# Patient Record
Sex: Female | Born: 1935 | Race: White | Hispanic: No | Marital: Married | Smoking: Never smoker
Health system: Southern US, Community
[De-identification: ages and names within clinical notes are randomized; demographics above are authoritative.]

## PROBLEM LIST (undated history)

## (undated) DIAGNOSIS — J189 Pneumonia, unspecified organism: Secondary | ICD-10-CM

## (undated) DIAGNOSIS — I340 Nonrheumatic mitral (valve) insufficiency: Secondary | ICD-10-CM

## (undated) DIAGNOSIS — E119 Type 2 diabetes mellitus without complications: Secondary | ICD-10-CM

## (undated) DIAGNOSIS — R6510 Systemic inflammatory response syndrome (SIRS) of non-infectious origin without acute organ dysfunction: Secondary | ICD-10-CM

## (undated) DIAGNOSIS — R2681 Unsteadiness on feet: Secondary | ICD-10-CM

## (undated) DIAGNOSIS — J449 Chronic obstructive pulmonary disease, unspecified: Secondary | ICD-10-CM

## (undated) DIAGNOSIS — I739 Peripheral vascular disease, unspecified: Secondary | ICD-10-CM

## (undated) DIAGNOSIS — E7800 Pure hypercholesterolemia, unspecified: Secondary | ICD-10-CM

## (undated) DIAGNOSIS — I6529 Occlusion and stenosis of unspecified carotid artery: Secondary | ICD-10-CM

## (undated) DIAGNOSIS — G9341 Metabolic encephalopathy: Secondary | ICD-10-CM

## (undated) DIAGNOSIS — M545 Low back pain, unspecified: Secondary | ICD-10-CM

## (undated) DIAGNOSIS — A0472 Enterocolitis due to Clostridium difficile, not specified as recurrent: Secondary | ICD-10-CM

## (undated) DIAGNOSIS — K219 Gastro-esophageal reflux disease without esophagitis: Secondary | ICD-10-CM

## (undated) DIAGNOSIS — G4700 Insomnia, unspecified: Secondary | ICD-10-CM

## (undated) DIAGNOSIS — G8929 Other chronic pain: Secondary | ICD-10-CM

## (undated) DIAGNOSIS — I4891 Unspecified atrial fibrillation: Secondary | ICD-10-CM

## (undated) DIAGNOSIS — R7881 Bacteremia: Secondary | ICD-10-CM

## (undated) DIAGNOSIS — A419 Sepsis, unspecified organism: Secondary | ICD-10-CM

## (undated) DIAGNOSIS — F32A Depression, unspecified: Secondary | ICD-10-CM

## (undated) DIAGNOSIS — R55 Syncope and collapse: Secondary | ICD-10-CM

## (undated) DIAGNOSIS — K5730 Diverticulosis of large intestine without perforation or abscess without bleeding: Secondary | ICD-10-CM

## (undated) DIAGNOSIS — I10 Essential (primary) hypertension: Secondary | ICD-10-CM

## (undated) DIAGNOSIS — K859 Acute pancreatitis without necrosis or infection, unspecified: Secondary | ICD-10-CM

## (undated) DIAGNOSIS — F329 Major depressive disorder, single episode, unspecified: Secondary | ICD-10-CM

## (undated) HISTORY — PX: VAGINAL HYSTERECTOMY: SUR661

## (undated) HISTORY — DX: Enterocolitis due to Clostridium difficile, not specified as recurrent: A04.72

## (undated) HISTORY — DX: Peripheral vascular disease, unspecified: I73.9

## (undated) HISTORY — DX: Sepsis, unspecified organism: A41.9

## (undated) HISTORY — DX: Metabolic encephalopathy: G93.41

## (undated) HISTORY — PX: APPENDECTOMY: SHX54

## (undated) HISTORY — PX: TONSILLECTOMY AND ADENOIDECTOMY: SUR1326

## (undated) HISTORY — DX: Systemic inflammatory response syndrome (sirs) of non-infectious origin without acute organ dysfunction: R65.10

## (undated) HISTORY — DX: Syncope and collapse: R55

## (undated) HISTORY — DX: Occlusion and stenosis of unspecified carotid artery: I65.29

## (undated) HISTORY — DX: Insomnia, unspecified: G47.00

## (undated) HISTORY — DX: Diverticulosis of large intestine without perforation or abscess without bleeding: K57.30

## (undated) HISTORY — DX: Unsteadiness on feet: R26.81

## (undated) HISTORY — DX: Nonrheumatic mitral (valve) insufficiency: I34.0

## (undated) HISTORY — PX: DILATION AND CURETTAGE OF UTERUS: SHX78

## (undated) HISTORY — DX: Pneumonia, unspecified organism: J18.9

---

## 1989-06-05 HISTORY — PX: CHOLECYSTECTOMY: SHX55

## 1998-04-23 ENCOUNTER — Encounter: Admission: RE | Admit: 1998-04-23 | Discharge: 1998-07-22 | Payer: Self-pay | Admitting: Internal Medicine

## 1998-07-30 ENCOUNTER — Encounter: Admission: RE | Admit: 1998-07-30 | Discharge: 1998-10-25 | Payer: Self-pay | Admitting: Internal Medicine

## 1998-11-11 ENCOUNTER — Encounter: Admission: RE | Admit: 1998-11-11 | Discharge: 1998-11-14 | Payer: Self-pay | Admitting: Internal Medicine

## 1999-02-03 ENCOUNTER — Encounter: Admission: RE | Admit: 1999-02-03 | Discharge: 1999-02-06 | Payer: Self-pay | Admitting: Orthopedic Surgery

## 1999-05-13 ENCOUNTER — Encounter: Admission: RE | Admit: 1999-05-13 | Discharge: 1999-05-23 | Payer: Self-pay | Admitting: Orthopedic Surgery

## 1999-06-06 HISTORY — PX: CATARACT EXTRACTION W/ INTRAOCULAR LENS  IMPLANT, BILATERAL: SHX1307

## 1999-08-18 ENCOUNTER — Encounter: Admission: RE | Admit: 1999-08-18 | Discharge: 1999-11-16 | Payer: Self-pay | Admitting: Orthopedic Surgery

## 1999-11-24 ENCOUNTER — Encounter: Admission: RE | Admit: 1999-11-24 | Discharge: 2000-02-22 | Payer: Self-pay | Admitting: Orthopedic Surgery

## 2000-04-30 ENCOUNTER — Encounter: Payer: Self-pay | Admitting: Internal Medicine

## 2000-04-30 ENCOUNTER — Encounter: Admission: RE | Admit: 2000-04-30 | Discharge: 2000-04-30 | Payer: Self-pay | Admitting: Internal Medicine

## 2000-05-26 ENCOUNTER — Encounter: Admission: RE | Admit: 2000-05-26 | Discharge: 2000-08-24 | Payer: Self-pay | Admitting: Orthopedic Surgery

## 2001-05-04 ENCOUNTER — Encounter: Payer: Self-pay | Admitting: Internal Medicine

## 2001-05-04 ENCOUNTER — Encounter: Admission: RE | Admit: 2001-05-04 | Discharge: 2001-05-04 | Payer: Self-pay | Admitting: Internal Medicine

## 2001-05-11 ENCOUNTER — Encounter: Payer: Self-pay | Admitting: Internal Medicine

## 2001-05-11 ENCOUNTER — Encounter: Admission: RE | Admit: 2001-05-11 | Discharge: 2001-05-11 | Payer: Self-pay | Admitting: Internal Medicine

## 2002-11-27 ENCOUNTER — Encounter: Admission: RE | Admit: 2002-11-27 | Discharge: 2002-11-27 | Payer: Self-pay | Admitting: Orthopedic Surgery

## 2002-11-27 ENCOUNTER — Encounter: Payer: Self-pay | Admitting: Orthopedic Surgery

## 2002-11-28 ENCOUNTER — Ambulatory Visit (HOSPITAL_BASED_OUTPATIENT_CLINIC_OR_DEPARTMENT_OTHER): Admission: RE | Admit: 2002-11-28 | Discharge: 2002-11-28 | Payer: Self-pay | Admitting: Orthopedic Surgery

## 2003-01-04 HISTORY — PX: COLONOSCOPY: SHX174

## 2003-01-15 ENCOUNTER — Encounter: Admission: RE | Admit: 2003-01-15 | Discharge: 2003-01-15 | Payer: Self-pay | Admitting: Internal Medicine

## 2003-01-15 ENCOUNTER — Encounter: Payer: Self-pay | Admitting: Internal Medicine

## 2003-07-13 ENCOUNTER — Encounter: Payer: Self-pay | Admitting: Internal Medicine

## 2003-07-13 ENCOUNTER — Encounter: Admission: RE | Admit: 2003-07-13 | Discharge: 2003-07-13 | Payer: Self-pay | Admitting: Internal Medicine

## 2003-08-03 ENCOUNTER — Encounter: Admission: RE | Admit: 2003-08-03 | Discharge: 2003-08-03 | Payer: Self-pay | Admitting: Internal Medicine

## 2003-08-06 ENCOUNTER — Encounter: Admission: RE | Admit: 2003-08-06 | Discharge: 2003-08-06 | Payer: Self-pay | Admitting: Internal Medicine

## 2003-08-24 ENCOUNTER — Encounter: Admission: RE | Admit: 2003-08-24 | Discharge: 2003-08-24 | Payer: Self-pay | Admitting: Internal Medicine

## 2003-10-01 ENCOUNTER — Ambulatory Visit (HOSPITAL_COMMUNITY): Admission: RE | Admit: 2003-10-01 | Discharge: 2003-10-01 | Payer: Self-pay | Admitting: Endocrinology

## 2003-10-09 ENCOUNTER — Ambulatory Visit (HOSPITAL_COMMUNITY): Admission: RE | Admit: 2003-10-09 | Discharge: 2003-10-09 | Payer: Self-pay | Admitting: Endocrinology

## 2003-10-09 ENCOUNTER — Encounter (INDEPENDENT_AMBULATORY_CARE_PROVIDER_SITE_OTHER): Payer: Self-pay | Admitting: *Deleted

## 2004-03-03 ENCOUNTER — Encounter: Admission: RE | Admit: 2004-03-03 | Discharge: 2004-06-01 | Payer: Self-pay | Admitting: Internal Medicine

## 2004-03-05 ENCOUNTER — Ambulatory Visit (HOSPITAL_COMMUNITY): Admission: RE | Admit: 2004-03-05 | Discharge: 2004-03-05 | Payer: Self-pay | Admitting: Internal Medicine

## 2004-04-22 ENCOUNTER — Ambulatory Visit (HOSPITAL_COMMUNITY): Admission: RE | Admit: 2004-04-22 | Discharge: 2004-04-22 | Payer: Self-pay | Admitting: Endocrinology

## 2004-06-05 ENCOUNTER — Encounter: Admission: RE | Admit: 2004-06-05 | Discharge: 2004-06-05 | Payer: Self-pay | Admitting: Orthopedic Surgery

## 2004-06-06 ENCOUNTER — Ambulatory Visit (HOSPITAL_BASED_OUTPATIENT_CLINIC_OR_DEPARTMENT_OTHER): Admission: RE | Admit: 2004-06-06 | Discharge: 2004-06-06 | Payer: Self-pay | Admitting: Orthopedic Surgery

## 2004-06-06 ENCOUNTER — Ambulatory Visit (HOSPITAL_COMMUNITY): Admission: RE | Admit: 2004-06-06 | Discharge: 2004-06-06 | Payer: Self-pay | Admitting: Orthopedic Surgery

## 2004-08-13 ENCOUNTER — Ambulatory Visit: Payer: Self-pay | Admitting: Internal Medicine

## 2004-11-05 ENCOUNTER — Ambulatory Visit: Payer: Self-pay | Admitting: Internal Medicine

## 2004-11-14 ENCOUNTER — Ambulatory Visit: Payer: Self-pay | Admitting: Internal Medicine

## 2005-02-02 ENCOUNTER — Ambulatory Visit: Payer: Self-pay | Admitting: Internal Medicine

## 2005-02-06 ENCOUNTER — Ambulatory Visit: Payer: Self-pay | Admitting: Internal Medicine

## 2005-02-09 ENCOUNTER — Ambulatory Visit: Payer: Self-pay | Admitting: Internal Medicine

## 2005-03-23 ENCOUNTER — Ambulatory Visit: Payer: Self-pay | Admitting: Internal Medicine

## 2005-04-01 ENCOUNTER — Ambulatory Visit: Payer: Self-pay | Admitting: Endocrinology

## 2005-04-14 ENCOUNTER — Ambulatory Visit (HOSPITAL_COMMUNITY): Admission: RE | Admit: 2005-04-14 | Discharge: 2005-04-14 | Payer: Self-pay | Admitting: Endocrinology

## 2005-04-29 ENCOUNTER — Encounter: Admission: RE | Admit: 2005-04-29 | Discharge: 2005-04-29 | Payer: Self-pay | Admitting: Endocrinology

## 2005-04-29 ENCOUNTER — Other Ambulatory Visit: Admission: RE | Admit: 2005-04-29 | Discharge: 2005-04-29 | Payer: Self-pay | Admitting: Interventional Radiology

## 2005-04-29 ENCOUNTER — Encounter (INDEPENDENT_AMBULATORY_CARE_PROVIDER_SITE_OTHER): Payer: Self-pay | Admitting: *Deleted

## 2005-05-05 ENCOUNTER — Ambulatory Visit: Payer: Self-pay | Admitting: Internal Medicine

## 2005-05-08 ENCOUNTER — Ambulatory Visit: Payer: Self-pay | Admitting: Internal Medicine

## 2005-05-22 ENCOUNTER — Ambulatory Visit (HOSPITAL_BASED_OUTPATIENT_CLINIC_OR_DEPARTMENT_OTHER): Admission: RE | Admit: 2005-05-22 | Discharge: 2005-05-22 | Payer: Self-pay | Admitting: Urology

## 2005-05-22 ENCOUNTER — Ambulatory Visit (HOSPITAL_COMMUNITY): Admission: RE | Admit: 2005-05-22 | Discharge: 2005-05-22 | Payer: Self-pay | Admitting: Urology

## 2005-08-11 ENCOUNTER — Ambulatory Visit: Payer: Self-pay | Admitting: Internal Medicine

## 2008-07-24 ENCOUNTER — Ambulatory Visit: Payer: Self-pay | Admitting: Cardiology

## 2008-07-24 ENCOUNTER — Inpatient Hospital Stay (HOSPITAL_COMMUNITY): Admission: EM | Admit: 2008-07-24 | Discharge: 2008-07-31 | Payer: Self-pay | Admitting: Emergency Medicine

## 2008-07-30 ENCOUNTER — Encounter (INDEPENDENT_AMBULATORY_CARE_PROVIDER_SITE_OTHER): Payer: Self-pay | Admitting: Internal Medicine

## 2008-08-08 ENCOUNTER — Ambulatory Visit: Payer: Self-pay

## 2008-08-14 ENCOUNTER — Ambulatory Visit: Payer: Self-pay | Admitting: Cardiology

## 2011-02-17 NOTE — Discharge Summary (Signed)
[name], [name] NO.:  [id number]   MEDICAL RECORD NO.:  [id number]          PATIENT TYPE:  INP   LOCATION:  1507                         FACILITY:  [facility]   PHYSICIAN:  Isidor Holts, M.D.  DATE OF BIRTH:  [birth year]   DATE OF ADMISSION:  07/24/2008  DATE OF DISCHARGE:                               DISCHARGE SUMMARY   DATE OF DISCHARGE:  To be determined.   DISCHARGE DIAGNOSES:  1. Acute pancreatitis.  2. Dehydration, volume depletion and  hypotension.  3. Dyslipidemia.  4. Type 2 diabetes mellitus.  5. Anemia of chronic disease.  6. Hypertension.  7. History of depression.  8. Irritable bowel syndrome.  9. Fluid overload.   DISCHARGE MEDICATIONS:  1. Glimepiride 4 mg p.o. b.i.d.  2. Omeprazole 20 mg p.o. b.i.d.  3. Zocor 20 mg p.o. q.h.s.  4. Citalopram 20 mg p.o. daily.  5. Fenofibrate 160 mg p.o. daily.  6. Alprazolam 0.5 mg p.o. p.r.n. t.i.d.  7. Lisinopril 5 mg p.o. daily.  8. Aspirin 81 mg p.o. daily.  9. Vitamin B12 1000 mcg p.o. daily.  10.Centrum Silver 1 p.o. daily.  11.Methylin 5 mg p.o. b.i.d.  12.HyoMax sublingual 0.125 mg p.r.n.   Note:  This medication list may of course be updated/modified at the  time of actual discharge, by discharging MD.   PROCEDURES:  1. Chest x-ray dated July 24, 2008.  This showed chronic changes,      no acute findings.  2. Abdominal/pelvic CT scan dated July 24, 2008.  This showed      fullness of the head of the pancreas compared to the body of and      tail. There also may be some stranding of fat surrounding the head      of the pancreas. Cannot rule early pancreatitis.  A 3 cm simple      cyst of the right kidney. Pelvic CT scan showed no acute or      significant findings.  3. Head CT scan dated July 24, 2008. This showed atrophy, no acute      or focal abnormality.  4. Chest x-ray dated July 26, 2008.  This showed perihilar      pulmonary edema and small bilateral effusions.  5. Chest x-ray dated July 28, 2008.  This showed progressive      slightly asymmetric airspace disease, right greater than left,      which may represent pulmonary edema, although infectious infiltrate      cannot be excluded in the proper clinical setting.   CONSULTATIONS:  None.   ADMISSION HISTORY:  As in H and P notes of July 24, 2008, dictated by  Dr. Richarda Overlie.  However, in brief this is a 77 year old female, with  known history of type 2 diabetes mellitus, dyslipidemia, hypertension,  depression, irritable bowel syndrome status post appendectomy, status  post cholecystectomy, presenting with a 1-day history of acute onset  abdominal pain in the epigastrium and left upper quadrant, radiating to  the back. In the emergency department, she was found to be hypotensive  with systolic blood pressure in the 70s. Initial laboratory  studies  showed an elevated lipase level of 513.  She was admitted further  evaluation, investigation and management.   CLINICAL COURSE:  1. Acute pancreatitis.  This was confirmed by elevated lipase levels,      as well as presenting symptoms. Abdominal CT scan demonstrated      fullness of the head of the pancreas, associated with      peripancreatic stranding, consistent with early pancreatitis.  She      was managed with bowel rest, intravenous fluid hydration,      analgesics with satisfactory clinical response. By July 25, 2008, lipase level had dropped to 203.  Thereafter, there was a      steady diminution of lipase, so that on July 29, 2008, lipase      level was entirely normal at 33.  The patient's abdominal pain      quickly resolved, and we were able to place her on liquid diet from      July 26, 2008.  She has tolerated this. Diet has been gradually      advanced and as of July 29, 2008 she had been advanced to soft      mechanical low fat diet.   1. Dehydration/volume depletion.  As mentioned above in the  presenting      history, the patient was found to be hypotensive with blood      pressure in the 70s in the emergency department.  This was      successfully addressed with aggressive intravenous fluid hydration.      BUN was 21, creatinine 1.33.  With intravenous fluid hydration, the      patient's volume status improved. By July 24, 2008, it was clear      that she was adequately hydrated.  Her intravenous fluid rate was      reduced on that date, in an attempt to avoid fluid overload.      Unfortunately we were unable to discontinue intravenous fluids      entirely on that date, because of persisting nausea, although the      patient had no vomiting, and at that time it was not yet clear      whether she would be able to tolerate advanced diet.   1. Fluid overload.  This was secondary to aggressive intravenous fluid      hydration.  The patient on July 28, 2008, became acutely short      of breath.  Chest x-ray was done, which confirmed pulmonary edema.      BNP was also significantly elevated at 555.  Intravenous fluids      were discontinued.  The patient was started on intravenous Lasix      and by July 29, 2008 she felt considerably better.  It is      anticipated that over the next few days, fluid status will      normalize.   1. Type 2 diabetes mellitus.  The patient was on Amaryl, pre-      admission.  In the first couple of days of admission she showed a      tendency to run low blood glucose levels.  She was, therefore,      managed with intravenous D5 normal saline.  However, we were      subsequently able to discontinue this, and she was managed with      sliding scale insulin coverage.  She has remained euglycemic during  the course of this hospitalization and on July 29, 2008 we      gingerly restarted her Amaryl.   1. Anemia of chronic disease.  The patient at the time of presentation      had a hemoglobin of 11.6, MCV was normocytic at 91.3.  Hemoglobin      levels have remained stable during the course of this      hospitalization, and as a matter of fact, hemoglobin was 11.4 with      hematocrit of 33.3 on July 29, 2008.  This is likely anemia of      chronic disease, against a background of known type 2 diabetes      mellitus.   1. Hypertension.  The patient's BP was marginally elevated at 148/82      on July 29, 2008.  She has therefore been recommenced on pre-      admission low-dose Lisinopril.   1. Dyslipidemia.  The patient has a known history of      dyslipidemia/hypertriglyceridemia and is on a combination of      Simvastatin and Tricor, which we have continued. Her lipid profile      during this hospitalization was as follows: Total cholesterol 124,      triglyceride 232, HDL 31, LDL 47.   1. History of depression.  The patient's mood remained stable during      the course of this hospitalization. She continues on pre-admission      antidepressant medication.   1. Irritable bowel syndrome.  There were no issues referable to this,      during the course of this hospitalization.   DISPOSITION:  The patient was on July 29, 2008, clearly on the mend,  and it is anticipated that over the next couple of days, she will  recover sufficiently to be discharged.  Details will be elucidated in  addendum at the appropriate time, by discharging MD, and the decision as  to whether or not to continue oral Lasix on discharge at least for the  short-term, will be addressed by discharging MD.   DIET:  Heart-healthy/carbohydrate modified. Recommended low-fat diet for  at least one week.   ACTIVITY:  As tolerated.   FOLLOW-UP INSTRUCTIONS:  The patient is to follow up routinely with her  primary MD, Dr. Rocky Morel at [facility].      Isidor Holts, M.D.  Electronically Signed     CO/MEDQ  D:  07/29/2008  T:  07/29/2008  Job:  [id number]   cc:   Rocky Morel, MD  [facility]

## 2011-02-17 NOTE — H&P (Signed)
NAME:  [name] [name], [name] [name] NO.:  [id number]   MEDICAL RECORD NO.:  [id number]          PATIENT TYPE:  EMS   LOCATION:  ED                           FACILITY:  [facility]   PHYSICIAN:  Richarda Overlie, MD       DATE OF BIRTH:  [birth year]   DATE OF ADMISSION:  07/24/2008  DATE OF DISCHARGE:                              HISTORY & PHYSICAL   PRIMARY CARE PHYSICIAN:  Dr. Rocky Morel at [facility] with  [facility].   SUBJECTIVE:  This is a 77 year old female with history of hypertension  and depression who presents to the ER with a chief complaint of  abdominal pain, fairly acute in onset that woke her up this morning.  The patient noticed that she had some musculoskeletal-like pain in her  thoracic and lumbar spine about a week ago which she attributed to  hyperextension of her spine while working  at home.  However, the pain  this morning was different and was associated with nausea without any  vomiting.  She denies any fevers, chills or rigors at home, but has had  a sore throat and a nonproductive cough for the last three days for  which she has been taking Zicam.  She also notices some loose stools for  the last three days up to 3-4 times a day for which she has been taking  hyoscyamine.  However, she has not had a bowel movement this morning.  She denies any associated chest pain, shortness of breath, orthopnea,  paroxysmal nocturnal dyspnea, peripheral edema, urinary urgency,  frequency or dysuria, any blood in her stools, black-tarry stool.  She  denies any symptoms of syncope, presyncope, lightheadedness.  Denies any  associated aggravating or relieving factors for her abdominal pain.  Her  abdominal pain is described as epigastric in region and left upper  quadrant with radiation to the back.  She denies being started on any  new medications.  Has had a gallbladder removed several years ago.  She  does note that her triglycerides were elevated during her last  check.  She had a triglyceride checked yesterday, but is unclear of the results.  In the ER, the patient did have an episode of hypertension and  unresponsiveness with systolic blood pressure in the 70s and responded  appropriately resuscitation with IV fluids and two large bore IVs were  placed.  Since then, the patient has been hemodynamically stable, alert,  oriented and conversive.   PAST MEDICAL HISTORY:  1. Hypertension.  2. Depression.   PAST SURGICAL HISTORY:  1. Appendectomy.  2. Cholecystectomy.   SOCIAL HISTORY:  Denies any use of alcohol or tobacco.   ALLERGIES:  PENICILLIN AND SULFA, BOTH OF WHICH CAUSE A RASH AND HIVES.   HOME MEDICATIONS:  1. Alprazolam 1 mg at bedtime.  2. Alprazolam 0.5 mg b.i.d. p.r.n.  3. Ritalin 5 mg b.i.d.  4. Simvastatin 20 mg once daily.  5. Omeprazole 20 mg b.i.d.  6. Lisinopril 5 mg once daily.  7. Lactase oral as needed.  8. HyoMax sublingual 0.125 mg as needed.  9. Celexa 20 mg once a day.  10.Fenofibrate 160 mg once daily.  11.Glimepiride 4 mg b.i.d.   REVIEW OF SYSTEMS:  I have included an HPI.   PHYSICAL EXAMINATION:  VITAL SIGNS:  Currently, the blood pressure is  120/46, pulse 70, respirations 18, temperature 98.2.  GENERAL:  The patient appears to be comfortable currently, no acute  distress.  HEENT:  Pupils equal, round and reactive to light.  Extraocular  movements intact.  NECK:  Supple without any JVD.  Cervical spine is nontender to  palpation.  CARDIOVASCULAR:  Regular rate and rhythm without any appreciable  murmurs, rubs or gallops.  RESPIRATIONS:  Clear to auscultation bilaterally.  No wheezes, crackles  or rhonchi.  CARDIOVASCULAR:  Regular rate and rhythm.  No appreciable murmurs, rubs  or gallops.  ABDOMEN:  Tender to palpation in the epigastric and the left upper  quadrant.  Hypoactive bowel sounds without any rebound or guarding.  EXTREMITIES:  Without any cyanosis, clubbing or edema.  NEUROLOGICAL:   Cranial nerves II-XII grossly intact.  SKIN:  Color is normal without any rash or petechiae.   LABORATORY DATA:  The patient has a CT abdomen without contrast and CT  pelvis without contrast.  CT of the head shows atrophy.  No acute focal  abnormality.  CT of the abdomen shows  acute pancreatitis, and a 3 cm  simple cyst  in the right kidney.  CT of the pelvis shows no acute or  significant findings.   Chest x-ray shows heart and mediastinal contour was to be normal, mild  peribronchial thickening and minimal scarring of the left mid lung zone.  No pleural fluid or osseous lesions.   LABORATORY DATA:  WBC 7.9, hemoglobin 11.6, hematocrit 34.6, MCV 91.3,  platelets 299.  Sodium 134, potassium 4.2, chloride 104, glucose 173,  BUN 21, creatinine 1.33, alk-phos 57, T-bili 0.7, AST 39, ALT 19,  calcium 9.2, lipase 513.  Cardiac enzymes:  CK 54, CK MB 1.6, troponin  0.01, BMP 50.4, UA negative for nitrite, leukocyte esterase.   ASSESSMENT/PLAN:  1. Pancreatitis, associated with hemodynamic compromise, given the      patient's hypotensive episode in the ER.  The patient currently      appears to be pain free.  The differential diagnosis for the causes      of pancreatitis would include medications such as Celexa which the      patient is taking, gall stone pancreatitis.  She denies any alcohol      use.  She is a nonsmoker.  Liver function is normal.  Doubt CBD      stone.  No previous history.  Therefore, unlikely to be autoimmune      pancreatitis.  Viral etiologies in the differential because of      recent illness.  The patient will be admitted to a stepdown unit      because of close monitoring of her blood pressure.  She will be      started on aggressive IV hydration with D-5 normal saline.  She is      a diabetic.  Kept n.p.o.  Will not start her on any antibiotics as      the patient's white count appears to be normal.  Follow her amylase      and lipase in the morning.  Start her  on a PPI, p.r.n. Dilaudid for      pain.  Hold the Celexa for now.  Check her triglyceride level to      rule out  hypertriglyceridemia.  2. Anemia.  Unclear of the patient's baseline hemoglobin.  However,      this needs to be followed closely in the setting of hypotension.      No obvious occult source of bleeding.  3. Diabetes.  Hold glimepiride.  Continue the patient on sliding scale      insulin and D-5 while the patient is n.p.o.  4. Depression.  Will continue with p.r.n. Xanax and Ritalin.  Will not      give any Celexa for now.  5. Hypertension.  Likely secondary to dehydration and vasovagal      response to morphine.  Will cycle cardiac enzymes to rule out any      evidence of acute coronary syndrome.  She is a full code being      admitted to the stepdown unit.      Richarda Overlie, MD  Electronically Signed     NA/MEDQ  D:  07/24/2008  T:  07/24/2008  Job:  [id number]

## 2011-02-17 NOTE — Assessment & Plan Note (Signed)
[facility]                            CARDIOLOGY OFFICE NOTE   [name] [name], [name] [name]                   MRN:          [id number]  DATE:08/14/2008                            DOB:          December 27, 1929    [name] [name] is 77 years of age.  I had seen her at [facility].  She  had pancreatitis and was treated very nicely.  She was appropriately  volume loaded.  However, she developed some shortness of breath.  She  also had some mild increase in heart rate.  There was a slight troponin  bump.  It was felt that all of this may have been related to the volume  overload.  She diuresed very nicely and stabilized.  Her echo showed  excellent LV function.  She was ultimately discharged and she came back  for an outpatient Myoview scan in our office.  Study was done on  August 08, 2008.  At that time, she received adenosine and she was  mildly hypotensive with the adenosine.  However, she did well.  Her  adenosine scan showed no significant signs of ischemia.   Today, she is feeling well.  She is not having any recurrent symptoms.   ALLERGIES:  PENICILLIN and SULFA.   MEDICATIONS:  1. Glimepiride 4 b.i.d.  2. Methylin 5 b.i.d.  3. Omeprazole.  4. Simvastatin 20.  5. Citalopram.  6. Fenofibrate.  7. Alprazolam.  8. Lisinopril.  9. Metoprolol.  10.Furosemide 20.  11.Potassium.  12.Ritalin 5 b.i.d.  13.Eye drops.   OTHER MEDICAL PROBLEMS:  See the list below.   REVIEW OF SYSTEMS:  She is not having any GI or GU symptoms at this  time.  She is not having any fevers or chills.  She has no skin rashes.  Otherwise, her review of systems is negative.   PHYSICAL EXAMINATION:  VITAL SIGNS:  Blood pressure is 123/74 with a  pulse of 83.  GENERAL:  The patient is here with a family member.  She is oriented to  person, time, and place.  Affect is normal.  HEENT:  No xanthelasma.  She has normal extraocular motion.  NECK:  There are no carotid bruits.   There is no jugular venous  distention.  LUNGS:  Clear.  Respiratory effort is not labored.  CARDIAC:  S1 and S2.  There are no clicks or significant murmurs.  ABDOMEN:  Soft.  EXTREMITIES:  She has no peripheral edema.   As outlined, the patient had an adenosine Myoview scan on August 08, 2008.  It revealed no significant ischemia and a normal ejection  fraction.   PROBLEMS:  1. Recent acute pancreatitis of unknown etiology that was treated and      resolved at [facility].  2. Hypokalemia that was treated.  3. Episode of volume overload that was probably congestive heart      failure that was diastolic related to volume overload.  4. Mild elevation of troponins probably due to the episode of      congestive heart failure.  5. Brief supraventricular tachycardia related to the episode of  congestive heart failure.  6. Diabetes.  7. Dyslipidemia.  8. Hypertension.  9. Ejection fraction 65%-70%.  10.Mild aortic valvular regurgitation.  11.Mild mitral annular calcification.  12.Mild mitral regurgitation.   The patient is stable.  She has no signs of ischemic heart disease.  She  is on appropriate medications.  She does not need any further cardiac  workup.  This note will go to Dr. Daryll Brod.  We have also given the  patient copies of her major documents from her hospitalization at [facility]  to take to Dr. Daryll Brod.  Most of those reports were copied to him  through the computer system in the hospital.     Luis Abed, MD, [facility]  Electronically Signed    JDK/MedQ  DD: 08/14/2008  DT: 08/15/2008  Job #: [id number]   cc:   Rocky Morel, MD

## 2011-02-17 NOTE — Consult Note (Signed)
NAME:  [name], [name] NO.:  [id number]   MEDICAL RECORD NO.:  [id number]          PATIENT TYPE:  INP   LOCATION:  1422                         FACILITY:  [facility]   PHYSICIAN:  Luis Abed, MD, FACCDATE OF BIRTH:  07/21/1930   DATE OF CONSULTATION:  07/30/2008  DATE OF DISCHARGE:                                 CONSULTATION   [name] was admitted with pancreatitis.  She was treated  appropriately with fluids and has been improving.  Unfortunately, she  then became short of breath.  Her BNP became elevated and she has a  troponin up to 0.10.  She also had a burst of supraventricular  tachycardia.  With all of these findings, cardiology was asked to  consult with further recommendations.  She denies any major chest pain  or shortness of breath in the past.  However, she does mention that she  has excess sweating when she shies to do any type of exercise.  She  has not had any syncope or presyncope.  There is no prior documented  significant coronary disease.  There is a history of hypertension.   PAST MEDICAL HISTORY:   ALLERGIES:  PENICILLIN, SULFA.   MEDICATIONS PRIOR TO ADMISSION:  She was on alprazolam, Ritalin,  simvastatin, omeprazole, lisinopril, Hyomax, Celexa, fenofibrate, and  glimepiride.   OTHER MEDICAL PROBLEMS:  See the complete list below.   SOCIAL HISTORY:  The patient does not smoke.  She is married and her  husband was in the room at the time of the evaluation.   FAMILY HISTORY:  See the records in the prior charts.   REVIEW OF SYSTEMS:  This morning she is comfortable in bed.  She is not  having any headaches or eye problems.  She is not having fever or rash.  She has no significant GI or GU symptoms at this time.  She does not  have any significant swelling.  She noted in the past 3 days, however,  that she has had some swelling in her hands.  Otherwise her review of  systems is negative.   PHYSICAL EXAM:  VITAL SIGNS:   Blood pressure is 121/76 with a pulse of  77.  CONSTITUTIONAL:  The patient is oriented to person, time and place.  Affect is normal.  She is here with her husband in the room.  HEENT:  Reveals no xanthelasma.  She has normal extraocular motion.  NECK:  There are no carotid bruits.  There is no jugular venous  distention.  CARDIAC:  Exam reveals S1 with an S2.  There are no clicks or  significant murmurs.  LUNGS:  Lungs reveal a few crackles in her left base.  ABDOMEN:  Soft.  EXTREMITIES:  She she has no significant peripheral edema at this time.   LABORATORY DATA:  Hemoglobin is 11.4.  BUN is 21 with a creatinine of  1.43.  Potassium is 3.8.  During the day yesterday, her potassium had  dropped to 2.6.  This increase in creatinine is above her baseline with  a BUN of 8 and creatinine is 0.97.  Initial BNP was normal.  Later BNP  revealed BNP in the range of 500.  Troponin was 0.02 on the 21st.  On  the 25th troponin was 0.09 followed by 0.10 followed by 0.12 followed by  0.09.  There was no significant CPK change during this time and in no  significant MB change.  EKG on admission revealed a tiny Q-wave in lead  III.  This has not changed significantly since being here.  On the 25th  there is a tracing showing rapid rate of approximately 130.  I believe  that the rhythm at this time is sinus tachycardia with scattered PACs.  There is another tracing from October 25 with a slower rate.  It is  possible that this could be atrial flutter.  Chest x-ray on July 24, 2008 had shown no acute findings.  On October 22, there was question of  some fluid overload and on October 24 there were small pleural  effusions.  These x-rays seem compatible with worsening fluid status.   PROBLEMS:  1. Pancreatitis.  This was the admitting diagnosis and this appears to      be improved.  2. Volume overload the that occurred with the patient in the hospital      while she was being appropriately  treated for pancreatitis.  The      patient then developed shortness of breath and symptoms secondary      to volume overload.  3. Troponin elevation up to 0.12.  I suspect it is most likely that      this is related to her episode of congestive heart failure related      to volume.  4. Supraventricular tachycardia.  This appears to be both sinus tach      with premature atrial contractions and the possibility of a brief      run of atrial flutter.  I would follow this clinically as this is      most likely related to her acute episode of congestive heart      failure.   At this point, I suspect that all of her cardiac symptoms are related to  volume overload with congestive heart failure leading to elevated  troponin, elevated BNP, shortness of breath and atrial arrhythmias.  Of  course we must keep the possibility of ischemic heart disease in mind.  As mentioned the EKG on admission showed a tiny Q-wave in lead III.   RECOMMENDATIONS:  1. Continue to monitor.  2. Increase activity.  3. Proceed with 2-D echo which is being done this morning.  If she has      normal LV function, then she can probably go home soon with      outpatient followup that should include an exercise test.  If her      LV function by echo is not normal, then we need to consider      ischemia more seriously at this time and strongly consider      proceeding with cardiac catheterization.  I would not anti-      coagulate the patient at this time for her atrial arrhythmias.      First of all, they may be very self limited and second, with      history of pancreatitis, we certainly do not want to increase her      risk of abdominal bleeding.   We will follow the patient.  Her primary care is in [facility].  I made  it clear that we will  be happy to send the information back to her  primary physician for to get cardiology followup in [facility].  If she  would like for her followup to be with our group, I will be  happy to see  her in followup.      Luis Abed, MD, [facility]  Electronically Signed     JDK/MEDQ  D:  07/30/2008  T:  07/30/2008  Job:  [id number]   cc:   Dr. Rocky Morel  [facility]

## 2011-02-17 NOTE — Discharge Summary (Signed)
[name] [name], [name] NO.:  [id number]   MEDICAL RECORD NO.:  [id number]          PATIENT TYPE:  INP   LOCATION:  1422                         FACILITY:  [facility]   PHYSICIAN:  Marcellus Scott, MD     DATE OF BIRTH:  May 21, 1930   DATE OF ADMISSION:  07/24/2008  DATE OF DISCHARGE:  07/31/2008                               DISCHARGE SUMMARY   ADDENDUM TO INTERIM DISCHARGE SUMMARY:  This is an addendum to the  interim discharge summary that was done by Dr. Isidor Holts on  July 29, 2008.  It will update events since including discharge  diagnosis and discharge medications.   PRIMARY CARE PHYSICIAN:  Dr. Rocky Morel, [place], [place].   PRIMARY CARDIOLOGIST:  Luis Abed, MD, [facility], [facility].   DISCHARGE DIAGNOSES:  1. Acute pancreatitis, unclear etiology, resolved.  2. Hypokalemia, secondary to diuretics.  3. Question chronic kidney disease.  4. Acute on chronic diastolic congestive heart failure secondary to      volume overload.  5. Type 2 non-ST elevation MI, for outpatient Myoview by [facility]      Cardiology.  6. Supraventricular tachycardia, resolved.  7. Type 2 diabetes mellitus.  8. Dyslipidemia.  9. Hypertension.   DISCHARGE MEDICATIONS:  1. Glimepiride reduced to 4 mg p.o. daily.  2. Methylin 5 mg p.o. b.i.d.  3. Omeprazole 20 mg p.o. b.i.d.  4. Simvastatin 20 mg p.o. daily.  5. Citalopram 20 mg p.o. daily.  6. Fenofibrate 160 mg p.o. daily.  7. HyoMax-FL p.r.n.  8. Alprazolam 1 mg tablet, half table two times a day and one tablet      at night.  9. Lisinopril 5 mg p.o. daily.  10.Aspirin 81 mg p.o. daily.  11.Vitamin B12, 1000 mcg p.o. daily.  12.Centrum Silver one p.o. daily.  13.Metoprolol 25 mg p.o. b.i.d.  14.Lasix 20 mg p.o. daily from August 01, 2008.  15.Potassium chloride 20 mEq p.o. daily from August 01, 2008.   PROCEDURES:  A 2D echocardiogram on July 30, 2008.  Summary:  Overall, left ventricular  systolic function was vigorous.  Left  ventricular ejection fraction was estimated, range being 65-70%.  There  were no left ventricular regional wall motion abnormalities.  Mild  aortic valve regurgitation, moderate mitral annular calcification.  Mild  mitral valve regurgitation.  Left atrium moderately dilated.   Chest x-ray on July 30, 2008.  Impression:  Enlarging bilateral  pleural effusions.  Re-resolution of pulmonary edema.  Mild vascular  congestion and bibasilar atelectasis.   PERTINENT LABORATORY DATA:  Cardiac panel today is negative.  Basic  metabolic panel with potassium of 3.1, BUN 29, creatinine 1.39, glucose  129, CBCs with hemoglobin 12.6, hematocrit 37.1, white blood cells 12,  platelets 403.  TSH 1.772.   CONSULTATIONS:  Cardiology, Dr. Myrtis Ser. And Dr. Shirlee Latch.   HOSPITAL COURSE/PATIENT DISPOSITION:  On the afternoon of July 29, 2008, the patient was noted to have a heart rate between 130-170 at  approximately 3:00 p.m. which was asymptomatic and detected on routine  vital signs.  EKG at that time shows sinus tachy with premature atrial  complexes at 132 beats per minute.  The patient was then placed on  telemetry, and cardiac enzymes were cycled and TSH was requested.  Overnight, she also experienced again irregular heart rate in the 130s  to 140s and increased to 170s with activity.  EKG at that time suggested  atrial flutter.  Subsequently, her troponin was noted to increase to  0.12.  The patient apparently complained of some dyspnea as well.  Her  CK and CK MB continued to be normal.  Cardiology was then consulted.  They determined that she had acute on chronic diastolic heart failure  secondary to volume overload, and diuresed her with good effect.  Her  echocardiogram shows good LV function.  They have cleared her for  discharge from a Cardiac's perspective.  Myoview will be arranged by  their offices as an outpatient.  The patient is to followup with  Dr.  Myrtis Ser in the next two weeks.  The patient is hypokalemic which is being  repleted today.  The patient will also be discharged on low-dose Lasix  and potassium supplements.  Her basic metabolic panel will have to be  followed as an outpatient closely, given her hypokalemia and mildly  elevated creatinine which may be an element of chronic kidney disease.  The patient, today, indicates that she is asymptomatic of any chest pain  or dyspnea or abdominal pain.  She is now tolerating diet well.  She is  at baseline activity and ambulating in the room and to the bathroom.  She indicates she will return home to stay with her husband and her  close friend of 50 years.  She is to follow up with her primary medical  doctor in the next 5-7 days with repeat basic metabolic panel and with  Dr. Myrtis Ser in the next two weeks.  I have discussed with Dr. Shirlee Latch who  has cleared her for discharge.      Marcellus Scott, MD  Electronically Signed     AH/MEDQ  D:  07/31/2008  T:  07/31/2008  Job:  [id number]   cc:   Luis Abed, MD, [facility]  [address]  [address]  [place]  [place] [address]   [place] Dr. Lennox Solders   Marca Ancona, MD  [address]  [place] [address]

## 2011-02-20 NOTE — Op Note (Signed)
NAME:  [name] [name], NEER NO.:  [id number]   MEDICAL RECORD NO.:  [id number]          PATIENT TYPE:  AMB   LOCATION:  NESC                         FACILITY:  [facility]   PHYSICIAN:  Mark C. Vernie Ammons, M.D.  DATE OF BIRTH:  Dec 28, 1929   DATE OF PROCEDURE:  05/22/2005  DATE OF DISCHARGE:                                 OPERATIVE REPORT   PREOPERATIVE DIAGNOSES:  Cystocele.   POSTOPERATIVE DIAGNOSES:  Cystocele.   PROCEDURE:  Anterior repair.   SURGEON:  Mark C. Vernie Ammons, M.D.   ANESTHESIA:  General.   BLOOD LOSS:  Minimal.   DRAINS:  None.   SPECIMENS:  None.   COMPLICATIONS:  None.   INDICATIONS:  The patient is a 77 year old white female who was seen for  bulging of her anterior vaginal wall to the level of the introitus.  Occasionally it causes discomfort in the lower abdomen low back. She has had  this for some time but was ill and now has improved to the point where she  wants to have this repaired. In October 2005, I found a grade 1 cystocele  only. She has no urgency or stress incontinence. She has stopped her Plavix  for 1 week. We discussed the risks, complications and alternatives to  surgical correction. She understands and has elected to proceed.   DESCRIPTION OF OPERATION:  After informed consent, the patient was brought  to the major OR, placed on the table, administered general anesthesia and  then moved to the dorsal lithotomy position. Her genitalia was sterilely  prepped and draped as well as the vagina.. She received preoperative  antibiotics using cefotetan 1 gram IV and I placed a 16-French Foley  catheter in the bladder and drained the bladder. I noted the cystocele  bulging to and almost out of the introitus. I therefore infiltrated the  subvaginal mucosa with 1/2% Marcaine with epinephrine and after allowing  adequate time for epinephrine effect, a midline incision was then made from  the level of the vaginal cuff on out to the level of  the nearly the  urethrovesical junction. I then placed Allis clamps on the vaginal mucosa  and used a combination of sharp and blunt dissection to dissect the bladder  away from the vaginal wall. This proceeded quite easily in fact so easily  that it made me concerned about the quality of her tissues. I had had her on  topical estrogen replacement for months prior to were surgery.   I was easily able to identify the lateral pelvic fascia on both sides and  used 2-0 braided nonabsorbable suture in a figure-of-eight fashion to  incorporate the edges of the fascia on each side and reapproximate those in  the midline. Several interrupted sutures of this fashion were placed  completely obliterating the cystocele. I then excised the excess vaginal  mucosa and closed the vagina with running 2-0 Vicryl suture. Two inch  Iodoform gauze with bacitracin ointment was then placed in the vagina as a  vaginal pack and the Foley catheter removed. The patient was awakened and  taken to recovery room in stable satisfactory condition. She  tolerated the  procedure well with no intraoperative complications. She will be given a  prescription for Vicodin HP #36 and Cipro 500 mg to take 1/2 tablet b.i.d.  for 5 days. She will follow-up in my office in 2 weeks, sooner if she has  any difficulty.      Mark C. Vernie Ammons, M.D.  Electronically Signed     MCO/MEDQ  D:  05/22/2005  T:  05/22/2005  Job:  [id number]

## 2011-02-20 NOTE — Op Note (Signed)
NAME:  [name] [name], [name] [name]                    ACCOUNT NO.:  [id number]   MEDICAL RECORD NO.:  [id number]                   PATIENT TYPE:  AMB   LOCATION:  DSC                                  FACILITY:  [facility]   PHYSICIAN:  Katy Fitch. Naaman Plummer., M.D.          DATE OF BIRTH:  Aug 12, 1930   DATE OF PROCEDURE:  06/06/2004  DATE OF DISCHARGE:                                 OPERATIVE REPORT   PREOPERATIVE DIAGNOSES:  Entrapment neuropathy of median nerve, right carpal  tunnel.   POSTOPERATIVE DIAGNOSES:  Entrapment neuropathy of median nerve, right  carpal tunnel.   OPERATION PERFORMED:  Release of right transverse carpal ligament.   SURGEON:  Katy Fitch. Sypher, M.D.   ASSISTANT:  Jonni Sanger, P.A.   ANESTHESIA:  General laryngeal mask.   SUPERVISING ANESTHESIOLOGIST:  Quita Skye. Krista Blue, M.D.   INDICATIONS FOR PROCEDURE:  [name] [name] is a 77 year old woman  referred by Dr. Merlene Laughter for evaluation and management of a numb and  painful right hand.  Clinical examination revealed signs of entrapment  consistent with carpal tunnel syndrome.  Electrodiagnostic studies confirmed  median neuropathy at the level of the wrist.  Due to failure to respond to  nonoperative measures, the patient is brought to the operating room at this  time for release of her right transverse carpal ligament.   DESCRIPTION OF PROCEDURE:  [name] [name] was brought to the operating  room and placed in supine position upon the operating table.  Following  induction of general anesthesia by LMA, the right arm was prepped with  Betadine soap and solution and sterilely draped.  Following exsanguination  of the limb with an Esmarch bandage, an arterial tourniquet was inflated to  220 mmHg.  The procedure commenced with a short incision in line with the  ring finger in the palm.  Subcutaneous tissues are carefully divided  revealing the palmar fascia.  This was split in the line of its fibers to  reveal the common sensory branch of the median nerve.  These were followed  back to the transverse carpal ligament which was carefully isolated from the  median nerve.  The ligament was released on its ulnar border extending to  the distal forearm.  This widely opened the carpal canal.  No masses or  other predicaments were noted.  Bleeding points along the margin of the  released ligament were electrocauterized with bipolar current followed by  repair of the skin with intradermal  3-0 Prolene suture.  A compressive dressing was applied with a volar plaster  splint maintaining the wrist in five degrees dorsiflexion.   For aftercare [name] [name] will elevate her hand times one week.  She  will work on range of motion exercises.  She was given a prescription for  Percocet 5 mg one or two tablets by mouth every four to six hours as needed  for pain, 20 tablets without refill.  Katy Fitch Naaman Plummer., M.D.    RVS/MEDQ  D:  06/06/2004  T:  06/07/2004  Job:  [id number]   cc:   Hal T. Stoneking, M.D.  [address] [place], [place] [address]  Fax: [phone]

## 2011-02-20 NOTE — Op Note (Signed)
NAME:  [name] [name], [name] [name]                    ACCOUNT NO.:  [id number]   MEDICAL RECORD NO.:  [id number]                   PATIENT TYPE:  AMB   LOCATION:  DSC                                  FACILITY:  [facility]   PHYSICIAN:  Katy Fitch. Naaman Plummer., M.D.          DATE OF BIRTH:  Jul 29, 1930   DATE OF PROCEDURE:  11/28/2002  DATE OF DISCHARGE:                                 OPERATIVE REPORT   PREOPERATIVE DIAGNOSIS:  Chronic painful stenosing tenosynovitis, right  thumb.   POSTOPERATIVE DIAGNOSIS:  Chronic painful stenosing tenosynovitis, right  thumb.   OPERATION:  Release of right thumb A1 pulley.   SURGEON:  Katy Fitch. Sypher, M.D.   ASSISTANT:  Jonni Sanger, P.A.   ANESTHESIA:  Marcaine 0.25% with 2% lidocaine.  Metacarpal head level block,  supplemented by sedation supervised by anesthesiologist.   ANESTHESIOLOGIST:  Edwin Cap. Zoila Shutter, M.D.   INDICATIONS FOR PROCEDURE:  The patient is a 77 year old woman referred by  Dr. Posey Rea for evaluation and management of her locking right thumb.  Clinical examination reveals stenosing tenosynovitis.  She has failed to  respond to nonoperative measures.  She is brought to the operating room at  this time for release of her right thumb A1 pulley.   DESCRIPTION OF PROCEDURE:  The patient was brought to the operating room and  placed in the supine position on the operating room table.  Following light  sedation, the right arm was prepped with Betadine soapy solution and  sterilely draped.  Following exsanguination of the limb with Esmarch  bandage, tourniquet was inflated to  220 mmHg.  The procedure commenced with infiltration of 0.25% Marcaine and  2% lidocaine into the path of the intended incision.  After determining that  anesthesia was satisfactory, a short transverse incision was fashioned  directly over the A1 pulley.  Subcutaneous tissue were carefully divided,  taking care to gently retract the radial proprii  digitales nerve.  The  pulley was isolated and cleared with a Freer and split with scalpel and  scissors.  Thereafter the tendon was delivered and found to be partially  necrotic due to compression.   Full range of motion of the IP joint was recovered.   The wound was then repaired with interrupted sutures of 5-0 nylon.   There were no intraoperative complications.  The patient tolerated the  surgery and anesthesia well.  She was transferred to recovery room with  stable vital signs.    AFTER CARE:  The patient was given a prescription for Darvocet-N 100 one  p.o. q.6h. p.r.n. pain (total of 20 tablets without refill).                                               Katy Fitch Naaman Plummer., M.D.    RVS/MEDQ  D:  11/28/2002  T:  11/28/2002  Job:  [id number]   cc:   Georgina Quint. Plotnikov, M.D. [facility]

## 2011-07-06 LAB — BASIC METABOLIC PANEL
BUN: 12
BUN: 21
BUN: 29 — ABNORMAL HIGH
CO2: 22
CO2: 23
CO2: 33 — ABNORMAL HIGH
CO2: 35 — ABNORMAL HIGH
CO2: 38 — ABNORMAL HIGH
Calcium: 8.2 — ABNORMAL LOW
Calcium: 8.7
Calcium: 8.7
Calcium: 9.4
Calcium: 9.7
Chloride: 89 — ABNORMAL LOW
Chloride: 91 — ABNORMAL LOW
Chloride: 91 — ABNORMAL LOW
Chloride: 98
Creatinine, Ser: 0.97
Creatinine, Ser: 1.27 — ABNORMAL HIGH
Creatinine, Ser: 1.39 — ABNORMAL HIGH
Creatinine, Ser: 1.43 — ABNORMAL HIGH
GFR calc Af Amer: 45 — ABNORMAL LOW
GFR calc Af Amer: 57 — ABNORMAL LOW
GFR calc non Af Amer: 47 — ABNORMAL LOW
GFR calc non Af Amer: 50 — ABNORMAL LOW
Glucose, Bld: 105 — ABNORMAL HIGH
Glucose, Bld: 129 — ABNORMAL HIGH
Glucose, Bld: 136 — ABNORMAL HIGH
Glucose, Bld: 145 — ABNORMAL HIGH
Glucose, Bld: 157 — ABNORMAL HIGH
Potassium: 3.6
Potassium: 3.7
Potassium: 3.8
Potassium: 4.8
Sodium: 138
Sodium: 139
Sodium: 142

## 2011-07-06 LAB — CBC
HCT: 33.3 — ABNORMAL LOW
HCT: 37.1
Hemoglobin: 11.4 — ABNORMAL LOW
Hemoglobin: 11.6 — ABNORMAL LOW
Hemoglobin: 12.6
Hemoglobin: 9.8 — ABNORMAL LOW
MCHC: 34.1
MCHC: 34.1
MCHC: 34.1
MCV: 92.4
MCV: 92.5
MCV: 92.6
RBC: 3.11 — ABNORMAL LOW
RBC: 4
RDW: 13.1
WBC: 12 — ABNORMAL HIGH

## 2011-07-06 LAB — GLUCOSE, CAPILLARY
Glucose-Capillary: 106 — ABNORMAL HIGH
Glucose-Capillary: 115 — ABNORMAL HIGH
Glucose-Capillary: 126 — ABNORMAL HIGH
Glucose-Capillary: 130 — ABNORMAL HIGH
Glucose-Capillary: 140 — ABNORMAL HIGH
Glucose-Capillary: 149 — ABNORMAL HIGH
Glucose-Capillary: 151 — ABNORMAL HIGH
Glucose-Capillary: 156 — ABNORMAL HIGH
Glucose-Capillary: 179 — ABNORMAL HIGH
Glucose-Capillary: 193 — ABNORMAL HIGH
Glucose-Capillary: 193 — ABNORMAL HIGH
Glucose-Capillary: 85

## 2011-07-06 LAB — CARDIAC PANEL(CRET KIN+CKTOT+MB+TROPI)
CK, MB: 0.7
CK, MB: 1.9
CK, MB: 2.2
CK, MB: 2.2
Relative Index: INVALID
Relative Index: INVALID
Relative Index: INVALID
Relative Index: INVALID
Total CK: 35
Total CK: 48
Total CK: 58
Total CK: 78
Troponin I: 0.09 — ABNORMAL HIGH
Troponin I: 0.12 — ABNORMAL HIGH

## 2011-07-06 LAB — URINALYSIS, ROUTINE W REFLEX MICROSCOPIC
Glucose, UA: NEGATIVE
Hgb urine dipstick: NEGATIVE
Protein, ur: NEGATIVE
Specific Gravity, Urine: 1.015
Urobilinogen, UA: 0.2
pH: 6.5

## 2011-07-06 LAB — TSH: TSH: 1.772

## 2011-07-06 LAB — PHOSPHORUS: Phosphorus: 3.7

## 2011-07-06 LAB — LIPASE, BLOOD: Lipase: 34

## 2011-07-06 LAB — DIFFERENTIAL
Basophils Absolute: 0
Lymphocytes Relative: 16
Lymphs Abs: 1.3
Neutro Abs: 6.2

## 2011-07-06 LAB — COMPREHENSIVE METABOLIC PANEL
ALT: 19
AST: 39 — ABNORMAL HIGH
Alkaline Phosphatase: 44
Alkaline Phosphatase: 57
BUN: 8
CO2: 23
Calcium: 9.2
Chloride: 104
Chloride: 111
Creatinine, Ser: 1.33 — ABNORMAL HIGH
GFR calc non Af Amer: 39 — ABNORMAL LOW
Glucose, Bld: 142 — ABNORMAL HIGH
Glucose, Bld: 173 — ABNORMAL HIGH
Potassium: 3.4 — ABNORMAL LOW
Total Bilirubin: 0.9

## 2011-07-06 LAB — CK TOTAL AND CKMB (NOT AT ARMC)
CK, MB: 1.6
Relative Index: INVALID
Total CK: 54

## 2011-07-06 LAB — TROPONIN I: Troponin I: 0.01

## 2011-07-06 LAB — B-NATRIURETIC PEPTIDE (CONVERTED LAB)
Pro B Natriuretic peptide (BNP): 284 — ABNORMAL HIGH
Pro B Natriuretic peptide (BNP): 50.4

## 2011-07-06 LAB — LIPID PANEL
Cholesterol: 124
LDL Cholesterol: 47
Total CHOL/HDL Ratio: 4

## 2011-07-06 LAB — CULTURE, BLOOD (ROUTINE X 2): Culture: NO GROWTH

## 2011-11-12 ENCOUNTER — Ambulatory Visit: Payer: Medicare Other

## 2011-11-12 ENCOUNTER — Other Ambulatory Visit: Payer: Self-pay

## 2011-11-12 ENCOUNTER — Inpatient Hospital Stay (HOSPITAL_BASED_OUTPATIENT_CLINIC_OR_DEPARTMENT_OTHER)
Admission: EM | Admit: 2011-11-12 | Discharge: 2011-11-30 | DRG: 439 | Disposition: A | Discharge status: Home Health | Payer: Medicare Other | Attending: Internal Medicine | Admitting: Internal Medicine

## 2011-11-12 ENCOUNTER — Encounter (HOSPITAL_BASED_OUTPATIENT_CLINIC_OR_DEPARTMENT_OTHER): Payer: Self-pay | Admitting: *Deleted

## 2011-11-12 ENCOUNTER — Emergency Department (INDEPENDENT_AMBULATORY_CARE_PROVIDER_SITE_OTHER): Payer: Medicare Other

## 2011-11-12 DIAGNOSIS — N179 Acute kidney failure, unspecified: Secondary | ICD-10-CM | POA: Diagnosis not present

## 2011-11-12 DIAGNOSIS — M940 Chondrocostal junction syndrome [Tietze]: Secondary | ICD-10-CM | POA: Diagnosis not present

## 2011-11-12 DIAGNOSIS — H409 Unspecified glaucoma: Secondary | ICD-10-CM | POA: Diagnosis present

## 2011-11-12 DIAGNOSIS — B3731 Acute candidiasis of vulva and vagina: Secondary | ICD-10-CM | POA: Diagnosis not present

## 2011-11-12 DIAGNOSIS — R609 Edema, unspecified: Secondary | ICD-10-CM | POA: Diagnosis not present

## 2011-11-12 DIAGNOSIS — E876 Hypokalemia: Secondary | ICD-10-CM | POA: Diagnosis present

## 2011-11-12 DIAGNOSIS — B373 Candidiasis of vulva and vagina: Secondary | ICD-10-CM | POA: Diagnosis not present

## 2011-11-12 DIAGNOSIS — R5381 Other malaise: Secondary | ICD-10-CM | POA: Diagnosis not present

## 2011-11-12 DIAGNOSIS — J189 Pneumonia, unspecified organism: Secondary | ICD-10-CM | POA: Diagnosis not present

## 2011-11-12 DIAGNOSIS — I4891 Unspecified atrial fibrillation: Secondary | ICD-10-CM | POA: Diagnosis not present

## 2011-11-12 DIAGNOSIS — N390 Urinary tract infection, site not specified: Secondary | ICD-10-CM | POA: Diagnosis present

## 2011-11-12 DIAGNOSIS — R109 Unspecified abdominal pain: Secondary | ICD-10-CM

## 2011-11-12 DIAGNOSIS — I10 Essential (primary) hypertension: Secondary | ICD-10-CM

## 2011-11-12 DIAGNOSIS — R509 Fever, unspecified: Secondary | ICD-10-CM | POA: Diagnosis not present

## 2011-11-12 DIAGNOSIS — Z9889 Other specified postprocedural states: Secondary | ICD-10-CM

## 2011-11-12 DIAGNOSIS — E119 Type 2 diabetes mellitus without complications: Secondary | ICD-10-CM | POA: Diagnosis present

## 2011-11-12 DIAGNOSIS — K859 Acute pancreatitis without necrosis or infection, unspecified: Principal | ICD-10-CM | POA: Diagnosis present

## 2011-11-12 DIAGNOSIS — Z79899 Other long term (current) drug therapy: Secondary | ICD-10-CM

## 2011-11-12 DIAGNOSIS — J9819 Other pulmonary collapse: Secondary | ICD-10-CM | POA: Diagnosis not present

## 2011-11-12 DIAGNOSIS — E871 Hypo-osmolality and hyponatremia: Secondary | ICD-10-CM | POA: Diagnosis present

## 2011-11-12 DIAGNOSIS — D72829 Elevated white blood cell count, unspecified: Secondary | ICD-10-CM | POA: Diagnosis not present

## 2011-11-12 DIAGNOSIS — E7800 Pure hypercholesterolemia, unspecified: Secondary | ICD-10-CM | POA: Diagnosis present

## 2011-11-12 DIAGNOSIS — K219 Gastro-esophageal reflux disease without esophagitis: Secondary | ICD-10-CM | POA: Diagnosis present

## 2011-11-12 HISTORY — DX: Pure hypercholesterolemia, unspecified: E78.00

## 2011-11-12 HISTORY — DX: Gastro-esophageal reflux disease without esophagitis: K21.9

## 2011-11-12 HISTORY — DX: Acute pancreatitis without necrosis or infection, unspecified: K85.90

## 2011-11-12 HISTORY — DX: Essential (primary) hypertension: I10

## 2011-11-12 LAB — CBC
MCH: 30.2 pg (ref 26.0–34.0)
MCV: 87.6 fL (ref 78.0–100.0)
Platelets: 257 10*3/uL (ref 150–400)
RDW: 12.5 % (ref 11.5–15.5)
WBC: 10.7 10*3/uL — ABNORMAL HIGH (ref 4.0–10.5)

## 2011-11-12 LAB — URINALYSIS, ROUTINE W REFLEX MICROSCOPIC
Glucose, UA: NEGATIVE mg/dL
Hgb urine dipstick: NEGATIVE
Protein, ur: NEGATIVE mg/dL
Specific Gravity, Urine: 1.015 (ref 1.005–1.030)

## 2011-11-12 LAB — BASIC METABOLIC PANEL
CO2: 22 mEq/L (ref 19–32)
Calcium: 9.9 mg/dL (ref 8.4–10.5)
GFR calc non Af Amer: 42 mL/min — ABNORMAL LOW (ref >90)
Potassium: 4.1 mEq/L (ref 3.5–5.1)
Sodium: 134 mEq/L — ABNORMAL LOW (ref 135–145)

## 2011-11-12 LAB — HEPATIC FUNCTION PANEL
Bilirubin, Direct: 0.3 mg/dL (ref 0.0–0.3)
Indirect Bilirubin: 0.2 mg/dL — ABNORMAL LOW (ref 0.3–0.9)
Total Protein: 7.4 g/dL (ref 6.0–8.3)

## 2011-11-12 LAB — URINE MICROSCOPIC-ADD ON

## 2011-11-12 LAB — CARDIAC PANEL(CRET KIN+CKTOT+MB+TROPI)
CK, MB: 2 ng/mL (ref 0.3–4.0)
Total CK: 41 U/L (ref 7–177)
Troponin I: <0.3 ng/mL (ref <0.30)

## 2011-11-12 MED ORDER — ONDANSETRON HCL 4 MG/2ML IJ SOLN
4.0000 mg | Freq: Once | INTRAMUSCULAR | Status: AC
  Administered 2011-11-12: 4 mg via INTRAVENOUS

## 2011-11-12 MED ORDER — FENTANYL CITRATE 0.05 MG/ML IJ SOLN
50.0000 ug | Freq: Once | INTRAMUSCULAR | Status: AC
  Administered 2011-11-12: 23:00:00 via INTRAVENOUS
  Filled 2011-11-12: qty 2

## 2011-11-12 MED ORDER — ONDANSETRON HCL 4 MG/2ML IJ SOLN
INTRAMUSCULAR | Status: AC
  Filled 2011-11-12: qty 2

## 2011-11-12 NOTE — ED Notes (Signed)
Pt reports RUQ pain that began this morning. Radiates around to the back. Denies CP/SOB. Some nausea. Denies vomiting or diarrhea. Denies belching or flatulance but states she does feel as if she has reflux today. Denies fevers.

## 2011-11-12 NOTE — ED Notes (Signed)
Pt states this feels like episodes of pancreatitis

## 2011-11-13 ENCOUNTER — Inpatient Hospital Stay (HOSPITAL_COMMUNITY): Payer: Medicare Other

## 2011-11-13 ENCOUNTER — Encounter (HOSPITAL_COMMUNITY): Payer: Self-pay | Admitting: *Deleted

## 2011-11-13 ENCOUNTER — Encounter (HOSPITAL_COMMUNITY)
Admission: EM | Disposition: A | Discharge status: Home Health | Payer: Self-pay | Source: Home / Self Care | Attending: Internal Medicine

## 2011-11-13 DIAGNOSIS — E871 Hypo-osmolality and hyponatremia: Secondary | ICD-10-CM | POA: Diagnosis present

## 2011-11-13 DIAGNOSIS — E7800 Pure hypercholesterolemia, unspecified: Secondary | ICD-10-CM | POA: Diagnosis present

## 2011-11-13 DIAGNOSIS — I10 Essential (primary) hypertension: Secondary | ICD-10-CM | POA: Diagnosis present

## 2011-11-13 DIAGNOSIS — H409 Unspecified glaucoma: Secondary | ICD-10-CM | POA: Diagnosis present

## 2011-11-13 DIAGNOSIS — N390 Urinary tract infection, site not specified: Secondary | ICD-10-CM | POA: Diagnosis present

## 2011-11-13 DIAGNOSIS — K859 Acute pancreatitis without necrosis or infection, unspecified: Secondary | ICD-10-CM | POA: Diagnosis present

## 2011-11-13 DIAGNOSIS — E119 Type 2 diabetes mellitus without complications: Secondary | ICD-10-CM | POA: Diagnosis present

## 2011-11-13 HISTORY — PX: ERCP: SHX5425

## 2011-11-13 LAB — BASIC METABOLIC PANEL
CO2: 23 mEq/L (ref 19–32)
Calcium: 9.7 mg/dL (ref 8.4–10.5)
Glucose, Bld: 212 mg/dL — ABNORMAL HIGH (ref 70–99)
Potassium: 4.4 mEq/L (ref 3.5–5.1)
Sodium: 134 mEq/L — ABNORMAL LOW (ref 135–145)

## 2011-11-13 LAB — LIPASE, BLOOD: Lipase: 495 U/L — ABNORMAL HIGH (ref 11–59)

## 2011-11-13 LAB — CBC
Hemoglobin: 12 g/dL (ref 12.0–15.0)
MCH: 29.9 pg (ref 26.0–34.0)
MCV: 89.3 fL (ref 78.0–100.0)
RBC: 4.02 MIL/uL (ref 3.87–5.11)

## 2011-11-13 LAB — GLUCOSE, CAPILLARY: Glucose-Capillary: 138 mg/dL — ABNORMAL HIGH (ref 70–99)

## 2011-11-13 SURGERY — ERCP, WITH INTERVENTION IF INDICATED
Anesthesia: Moderate Sedation

## 2011-11-13 MED ORDER — BUTAMBEN-TETRACAINE-BENZOCAINE 2-2-14 % EX AERO
INHALATION_SPRAY | CUTANEOUS | Status: DC | PRN
  Administered 2011-11-13: 2 via TOPICAL

## 2011-11-13 MED ORDER — CIPROFLOXACIN IN D5W 400 MG/200ML IV SOLN: 400.0000 mg | Freq: Once | INTRAVENOUS | Status: DC

## 2011-11-13 MED ORDER — LATANOPROST 0.005 % OP SOLN
1.0000 [drp] | Freq: Every day | OPHTHALMIC | Status: DC
  Administered 2011-11-13 – 2011-11-29 (×17): 1 [drp] via OPHTHALMIC
  Filled 2011-11-13 (×2): qty 2.5

## 2011-11-13 MED ORDER — SODIUM CHLORIDE 0.9 % IV SOLN
INTRAVENOUS | Status: DC | PRN
  Administered 2011-11-13: 17:00:00

## 2011-11-13 MED ORDER — ONDANSETRON HCL 4 MG/2ML IJ SOLN
4.0000 mg | Freq: Once | INTRAMUSCULAR | Status: AC
  Administered 2011-11-13: 4 mg via INTRAVENOUS

## 2011-11-13 MED ORDER — LIP MEDEX EX OINT
TOPICAL_OINTMENT | CUTANEOUS | Status: AC
  Administered 2011-11-13: 01:00:00
  Filled 2011-11-13: qty 7

## 2011-11-13 MED ORDER — SODIUM CHLORIDE 0.9 % IV SOLN
Freq: Once | INTRAVENOUS | Status: AC
  Administered 2011-11-13: 500 mL via INTRAVENOUS

## 2011-11-13 MED ORDER — FENTANYL CITRATE 0.05 MG/ML IJ SOLN
50.0000 ug | Freq: Once | INTRAMUSCULAR | Status: AC
  Administered 2011-11-13: 50 ug via INTRAVENOUS
  Filled 2011-11-13: qty 2

## 2011-11-13 MED ORDER — CHLORHEXIDINE GLUCONATE 0.12 % MT SOLN
15.0000 mL | Freq: Two times a day (BID) | OROMUCOSAL | Status: DC
  Administered 2011-11-13 – 2011-11-23 (×21): 15 mL via OROMUCOSAL
  Filled 2011-11-13 (×21): qty 15

## 2011-11-13 MED ORDER — PANTOPRAZOLE SODIUM 40 MG PO TBEC
40.0000 mg | DELAYED_RELEASE_TABLET | Freq: Every day | ORAL | Status: DC
  Administered 2011-11-13 – 2011-11-30 (×18): 40 mg via ORAL
  Filled 2011-11-13 (×16): qty 1

## 2011-11-13 MED ORDER — ALPRAZOLAM 0.5 MG PO TABS: 1.0000 mg | ORAL_TABLET | Freq: Every evening | ORAL | Status: DC | PRN

## 2011-11-13 MED ORDER — GLUCAGON HCL (RDNA) 1 MG IJ SOLR
INTRAMUSCULAR | Status: DC | PRN
  Administered 2011-11-13: .5 mg via INTRAVENOUS

## 2011-11-13 MED ORDER — BIOTENE DRY MOUTH MT LIQD
15.0000 mL | Freq: Two times a day (BID) | OROMUCOSAL | Status: DC
  Administered 2011-11-13 – 2011-11-22 (×13): 15 mL via OROMUCOSAL

## 2011-11-13 MED ORDER — ONDANSETRON HCL 4 MG/2ML IJ SOLN
4.0000 mg | Freq: Four times a day (QID) | INTRAMUSCULAR | Status: DC | PRN
  Administered 2011-11-13 – 2011-11-22 (×7): 4 mg via INTRAVENOUS
  Filled 2011-11-13 (×7): qty 2

## 2011-11-13 MED ORDER — SIMVASTATIN 40 MG PO TABS
40.0000 mg | ORAL_TABLET | Freq: Every evening | ORAL | Status: DC
  Administered 2011-11-13 – 2011-11-28 (×16): 40 mg via ORAL
  Filled 2011-11-13 (×17): qty 1

## 2011-11-13 MED ORDER — CIPROFLOXACIN IN D5W 400 MG/200ML IV SOLN
400.0000 mg | Freq: Once | INTRAVENOUS | Status: AC
  Administered 2011-11-13: 400 mg via INTRAVENOUS
  Filled 2011-11-13: qty 200

## 2011-11-13 MED ORDER — MIDAZOLAM HCL 10 MG/2ML IJ SOLN
INTRAMUSCULAR | Status: AC
  Filled 2011-11-13: qty 4

## 2011-11-13 MED ORDER — FENTANYL CITRATE 0.05 MG/ML IJ SOLN
INTRAMUSCULAR | Status: DC | PRN
  Administered 2011-11-13 (×5): 25 ug via INTRAVENOUS

## 2011-11-13 MED ORDER — LISINOPRIL 5 MG PO TABS
5.0000 mg | ORAL_TABLET | Freq: Every day | ORAL | Status: DC
  Administered 2011-11-13 – 2011-11-30 (×18): 5 mg via ORAL
  Filled 2011-11-13 (×18): qty 1

## 2011-11-13 MED ORDER — FENOFIBRATE 160 MG PO TABS
160.0000 mg | ORAL_TABLET | Freq: Every day | ORAL | Status: DC
  Administered 2011-11-14 – 2011-11-19 (×6): 160 mg via ORAL
  Filled 2011-11-13 (×8): qty 1

## 2011-11-13 MED ORDER — HYDROMORPHONE HCL PF 1 MG/ML IJ SOLN
1.0000 mg | INTRAMUSCULAR | Status: DC | PRN
  Administered 2011-11-13 (×2): 1 mg via INTRAVENOUS
  Filled 2011-11-13 (×2): qty 1

## 2011-11-13 MED ORDER — ADULT MULTIVITAMIN W/MINERALS CH
1.0000 | ORAL_TABLET | Freq: Every day | ORAL | Status: DC
  Administered 2011-11-15 – 2011-11-30 (×16): 1 via ORAL
  Filled 2011-11-13 (×19): qty 1

## 2011-11-13 MED ORDER — HYOSCYAMINE SULFATE 0.125 MG PO TBDP
0.1250 mg | ORAL_TABLET | ORAL | Status: DC | PRN
  Filled 2011-11-13: qty 1

## 2011-11-13 MED ORDER — DIPHENHYDRAMINE HCL 50 MG/ML IJ SOLN
INTRAMUSCULAR | Status: AC
  Filled 2011-11-13: qty 1

## 2011-11-13 MED ORDER — CIPROFLOXACIN IN D5W 400 MG/200ML IV SOLN
400.0000 mg | Freq: Two times a day (BID) | INTRAVENOUS | Status: DC
  Administered 2011-11-13 – 2011-11-15 (×6): 400 mg via INTRAVENOUS
  Filled 2011-11-13 (×7): qty 200

## 2011-11-13 MED ORDER — HYDROMORPHONE HCL PF 1 MG/ML IJ SOLN
0.5000 mg | INTRAMUSCULAR | Status: DC | PRN
  Administered 2011-11-13 – 2011-11-14 (×4): 0.5 mg via INTRAVENOUS
  Filled 2011-11-13 (×4): qty 1

## 2011-11-13 MED ORDER — METHYLPHENIDATE HCL 5 MG PO TABS
5.0000 mg | ORAL_TABLET | Freq: Two times a day (BID) | ORAL | Status: DC
  Administered 2011-11-13 – 2011-11-19 (×12): 5 mg via ORAL
  Filled 2011-11-13 (×13): qty 1

## 2011-11-13 MED ORDER — ONDANSETRON HCL 4 MG PO TABS: 4.0000 mg | ORAL_TABLET | Freq: Four times a day (QID) | ORAL | Status: DC | PRN

## 2011-11-13 MED ORDER — KCL IN DEXTROSE-NACL 20-5-0.9 MEQ/L-%-% IV SOLN
INTRAVENOUS | Status: DC
  Administered 2011-11-13: 05:00:00 via INTRAVENOUS
  Filled 2011-11-13 (×3): qty 1000

## 2011-11-13 MED ORDER — MIDAZOLAM HCL 10 MG/2ML IJ SOLN
INTRAMUSCULAR | Status: DC | PRN
  Administered 2011-11-13: 1 mg via INTRAVENOUS
  Administered 2011-11-13 (×2): 2 mg via INTRAVENOUS
  Administered 2011-11-13 (×3): 1 mg via INTRAVENOUS

## 2011-11-13 MED ORDER — GADOBENATE DIMEGLUMINE 529 MG/ML IV SOLN
12.0000 mL | Freq: Once | INTRAVENOUS | Status: AC
  Administered 2011-11-13: 12 mL via INTRAVENOUS

## 2011-11-13 MED ORDER — DIPHENHYDRAMINE HCL 50 MG/ML IJ SOLN
INTRAMUSCULAR | Status: DC | PRN
  Administered 2011-11-13: 25 mg via INTRAVENOUS

## 2011-11-13 MED ORDER — INSULIN ASPART 100 UNIT/ML ~~LOC~~ SOLN: 0.0000 [IU] | Freq: Three times a day (TID) | SUBCUTANEOUS | Status: DC

## 2011-11-13 MED ORDER — INSULIN ASPART 100 UNIT/ML ~~LOC~~ SOLN
0.0000 [IU] | Freq: Three times a day (TID) | SUBCUTANEOUS | Status: DC
  Administered 2011-11-14: 1 [IU] via SUBCUTANEOUS
  Administered 2011-11-14 – 2011-11-15 (×3): 2 [IU] via SUBCUTANEOUS
  Administered 2011-11-15: 3 [IU] via SUBCUTANEOUS
  Administered 2011-11-15: 1 [IU] via SUBCUTANEOUS
  Administered 2011-11-16: 3 [IU] via SUBCUTANEOUS
  Administered 2011-11-16: 1 [IU] via SUBCUTANEOUS
  Administered 2011-11-16: 2 [IU] via SUBCUTANEOUS
  Administered 2011-11-17: 3 [IU] via SUBCUTANEOUS
  Administered 2011-11-17: 1 [IU] via SUBCUTANEOUS
  Administered 2011-11-18: 2 [IU] via SUBCUTANEOUS
  Administered 2011-11-18 (×2): 1 [IU] via SUBCUTANEOUS
  Administered 2011-11-19: 2 [IU] via SUBCUTANEOUS
  Administered 2011-11-19 (×2): 1 [IU] via SUBCUTANEOUS
  Administered 2011-11-20: 2 [IU] via SUBCUTANEOUS
  Administered 2011-11-20: 3 [IU] via SUBCUTANEOUS
  Administered 2011-11-20 – 2011-11-21 (×4): 2 [IU] via SUBCUTANEOUS
  Administered 2011-11-22: 3 [IU] via SUBCUTANEOUS
  Administered 2011-11-22 (×2): 2 [IU] via SUBCUTANEOUS
  Administered 2011-11-23: 3 [IU] via SUBCUTANEOUS
  Administered 2011-11-23 – 2011-11-25 (×7): 2 [IU] via SUBCUTANEOUS
  Administered 2011-11-25: 1 [IU] via SUBCUTANEOUS
  Administered 2011-11-26 (×2): 2 [IU] via SUBCUTANEOUS
  Administered 2011-11-26 – 2011-11-27 (×2): 3 [IU] via SUBCUTANEOUS
  Administered 2011-11-27: 7 [IU] via SUBCUTANEOUS
  Administered 2011-11-27: 1 [IU] via SUBCUTANEOUS
  Administered 2011-11-28: 3 [IU] via SUBCUTANEOUS
  Administered 2011-11-28: 1 [IU] via SUBCUTANEOUS
  Administered 2011-11-28 – 2011-11-29 (×2): 5 [IU] via SUBCUTANEOUS
  Administered 2011-11-29 – 2011-11-30 (×3): 2 [IU] via SUBCUTANEOUS
  Administered 2011-11-30: 5 [IU] via SUBCUTANEOUS
  Filled 2011-11-13: qty 3

## 2011-11-13 MED ORDER — GLUCAGON HCL (RDNA) 1 MG IJ SOLR
INTRAMUSCULAR | Status: AC
  Filled 2011-11-13: qty 2

## 2011-11-13 MED ORDER — SODIUM CHLORIDE 0.9 % IV SOLN
INTRAVENOUS | Status: DC
  Administered 2011-11-13 – 2011-11-15 (×6): via INTRAVENOUS
  Administered 2011-11-16: 150 mL/h via INTRAVENOUS
  Administered 2011-11-16 – 2011-11-18 (×7): via INTRAVENOUS

## 2011-11-13 MED ORDER — ONDANSETRON HCL 4 MG/2ML IJ SOLN
INTRAMUSCULAR | Status: AC
  Filled 2011-11-13: qty 2

## 2011-11-13 MED ORDER — FENTANYL CITRATE 0.05 MG/ML IJ SOLN
INTRAMUSCULAR | Status: AC
  Filled 2011-11-13: qty 4

## 2011-11-13 NOTE — H&P (View-Only) (Signed)
Subjective: 78 YO thinks her epigastric pain is better but still having nausea. No CP, no SOB, Thinks pain meds are making her sick  Objective: Vital signs in last 24 hours: Filed Vitals:   11/13/11 0143 11/13/11 0230 11/13/11 0526 11/13/11 0940  BP: 121/55 129/57 110/46 122/58  Pulse: 83 76 78 79  Temp: 98.4 F (36.9 C) 97.8 F (36.6 C) 98.1 F (36.7 C) 98.2 F (36.8 C)  TempSrc: Oral   Oral  Resp: 16 18 18 19  Height:      Weight:      SpO2: 95% 96% 98% 97%   Weight change:   Intake/Output Summary (Last 24 hours) at 11/13/11 1220 Last data filed at 11/13/11 0655  Gross per 24 hour  Intake    100 ml  Output    400 ml  Net   -300 ml    Physical Exam: General: Awake, Oriented, No acute distress. HEENT: EOMI. Neck: Supple CV: S1 and S2 Lungs: Clear to ascultation bilaterally Abdomen: soft, min tenderness with palapation, Nondistended, +bowel sounds. Ext: Good pulses. No edema.   Lab Results:  Basename 11/13/11 0615 11/12/11 2253  NA 134* 134*  K 4.4 4.1  CL 98 99  CO2 23 22  GLUCOSE 212* 190*  BUN 20 22  CREATININE 1.02 1.20*  CALCIUM 9.7 9.9  MG 1.9 --  PHOS 3.3 --    Basename 11/12/11 2253  AST 164*  ALT 63*  ALKPHOS 57  BILITOT 0.5  PROT 7.4  ALBUMIN 4.2    Basename 11/13/11 0615 11/12/11 2253  LIPASE 495* 622*  AMYLASE -- --    Basename 11/13/11 0615 11/12/11 2253  WBC 10.0 10.7*  NEUTROABS -- --  HGB 12.0 12.7  HCT 35.9* 36.8  MCV 89.3 87.6  PLT 234 257    Basename 11/12/11 2253  CKTOTAL 41  CKMB 2.0  CKMBINDEX --  TROPONINI <0.30   No components found with this basename: POCBNP:3 No results found for this basename: DDIMER:2 in the last 72 hours No results found for this basename: HGBA1C:2 in the last 72 hours No results found for this basename: CHOL:2,HDL:2,LDLCALC:2,TRIG:2,CHOLHDL:2,LDLDIRECT:2 in the last 72 hours No results found for this basename: TSH,T4TOTAL,FREET3,T3FREE,THYROIDAB in the last 72 hours No results found  for this basename: VITAMINB12:2,FOLATE:2,FERRITIN:2,TIBC:2,IRON:2,RETICCTPCT:2 in the last 72 hours  Micro Results: No results found for this or any previous visit (from the past 240 hour(s)).  Studies/Results: Dg Chest 2 View  11/12/2011  *RADIOLOGY REPORT*  Clinical Data: Abdominal pain, hypertension  CHEST - 2 VIEW  Comparison: 07/30/2008  Findings:Improved aeration in the lower lobes.  Lungs clear.  Heart size normal.  Atheromatous aortic arch.  No effusion.  Vascular clips in the upper abdomen.  Regional bones unremarkable.  IMPRESSION: .1.  No acute disease  Original Report Authenticated By: D. DANIEL HASSELL III, M.D.    Medications: I have reviewed the patient's current medications. Scheduled Meds:   . antiseptic oral rinse  15 mL Mouth Rinse q12n4p  . chlorhexidine  15 mL Mouth Rinse BID  . ciprofloxacin  400 mg Intravenous Once  . ciprofloxacin  400 mg Intravenous Q12H  . fenofibrate  160 mg Oral Daily  . fentaNYL  50 mcg Intravenous Once  . fentaNYL  50 mcg Intravenous Once  . gadobenate dimeglumine  12 mL Intravenous Once  . latanoprost  1 drop Both Eyes QHS  . lip balm      . lisinopril  5 mg Oral Daily  . methylphenidate    5 mg Oral BID WC  . mulitivitamin with minerals  1 tablet Oral Daily  . ondansetron      . ondansetron      . ondansetron  4 mg Intravenous Once  . ondansetron  4 mg Intravenous Once  . pantoprazole  40 mg Oral Q1200  . simvastatin  40 mg Oral QPM   Continuous Infusions:   . dextrose 5 % and 0.9 % NaCl with KCl 20 mEq/L 125 mL/hr at 11/13/11 0511   PRN Meds:.ALPRAZolam, HYDROmorphone, hyoscyamine, ondansetron (ZOFRAN) IV, ondansetron  Assessment/Plan: Active Problems:  Pancreatitis- d/c NPO will give clears as tolerated, IVF, s/p MRCP- await results  UTI (lower urinary tract infection)- cipro- does not appear culture was done before antibiotics given  DM (diabetes mellitus)- SSI, D/C D5- BS have been high- restart amaryl once eating  consistantly  HTN (hypertension)- stable  Hypercholesteremia  Glaucoma (increased eye pressure)- continue eye drops Hyponatremia- corrects with BS  Dispo: Depending on MRCP- will change plan from there   ADDENDUM- MRCP can back positive with stone impacting duct Dr. Hung notified for ERCP- will update patient   LOS: 1 day  Ramil Edgington, DO 11/13/2011, 12:20 PM 

## 2011-11-13 NOTE — Op Note (Signed)
[facility] [address] [place], [place]  [address]  ERCP PROCEDURE REPORT  PATIENT:  [name] [name], [name] [name]  MR#:  [id number] BIRTHDATE:  29-Mar-1930  GENDER:  female ENDOSCOPIST:  Jeani Hawking, MD PROCEDURE DATE:  11/13/2011 PROCEDURE:  ERCP with removal of stones ASA CLASS:  Class II INDICATIONS:  suspected stone MEDICATIONS:   Fentanyl 125 mcg IV, Versed 8 mg IV, Benadryl 25 mg IV, glucagon 0.5 mg IV TOPICAL ANESTHETIC:  Cetacaine Spray  DESCRIPTION OF PROCEDURE:   After the risks benefits and alternatives of the procedure were thoroughly explained, informed consent was obtained.  The Pentax [id number], [id number] [phone]) and [id number] ([id number]) endoscope was introduced through the mouth and advanced to the second portion of the duodenum.  FINDINGS: The ampulla was noted to be spontaneously draining bile. Cannulation of the CBD was difficult and multiple passes into the proximal pancreatic duct occurred. A slight amount of contrast was injected into the PD in order to elucidate the ductal anatomy. During one of the engagements in the PD a small sphincterotomy was created. The allowed the guidewire to cannulate into the CBD. The guidewire was secured in the right intrahepatic ducts. Contrast injection revealed a dilated CBD at 12 mm and a possible filling defect in the distal CBD. The sphincterotomy was enlarged and free flow of bile occurred. The CBD was swept three times and no evidence of any stones. She may of had a small stone that passed too quickly to be visualized. A final occlusion cholangiogram was performed and there was no evidence of any retained stones. A final inspection of the ampulla was performed and it appeared that there was a tear that extended laterally from the sphincterotomy. I was concerned that this may have represented a small retroperitoneal perforation. Despite use of the duodenoscope and an upper endoscope I was not  able to place a hemoclip across the site. No bleeding was identified. The patient's vital signs were stable and her abdomen was soft. At this point the procedure was terminated.    The scope was then completely withdrawn from the patient and the procedure terminated. <<PROCEDUREIMAGES>>  COMPLICATIONS:  None ENDOSCOPIC IMPRESSION: 1) Dilated CBD.  No clear evidence of a retained stone.  ? Retoperitoneal perforation. RECOMMENDATIONS: 1) KUB to check for perforation. 2) Follow liver enzymes. 3) Continue with antibiotics.  ______________________________ Jeani Hawking, MD  n. Rosalie DoctorJeani Hawking at 11/13/2011 05:18 PM  Cristobal Goldmann, [phone]

## 2011-11-13 NOTE — Progress Notes (Signed)
Utilization review complete 

## 2011-11-13 NOTE — Progress Notes (Addendum)
Subjective: 78 YO thinks her epigastric pain is better but still having nausea. No CP, no SOB, Thinks pain meds are making her sick  Objective: Vital signs in last 24 hours: Filed Vitals:   11/13/11 0143 11/13/11 0230 11/13/11 0526 11/13/11 0940  BP: 121/55 129/57 110/46 122/58  Pulse: 83 76 78 79  Temp: 98.4 F (36.9 C) 97.8 F (36.6 C) 98.1 F (36.7 C) 98.2 F (36.8 C)  TempSrc: Oral   Oral  Resp: 16 18 18 19   Height:      Weight:      SpO2: 95% 96% 98% 97%   Weight change:   Intake/Output Summary (Last 24 hours) at 11/13/11 1220 Last data filed at 11/13/11 0655  Gross per 24 hour  Intake    100 ml  Output    400 ml  Net   -300 ml    Physical Exam: General: Awake, Oriented, No acute distress. HEENT: EOMI. Neck: Supple CV: S1 and S2 Lungs: Clear to ascultation bilaterally Abdomen: soft, min tenderness with palapation, Nondistended, +bowel sounds. Ext: Good pulses. No edema.   Lab Results:  [facility] 11/13/11 0615 11/12/11 2253  NA 134* 134*  K 4.4 4.1  CL 98 99  CO2 23 22  GLUCOSE 212* 190*  BUN 20 22  CREATININE 1.02 1.20*  CALCIUM 9.7 9.9  MG 1.9 --  PHOS 3.3 --    Basename 11/12/11 2253  AST 164*  ALT 63*  ALKPHOS 57  BILITOT 0.5  PROT 7.4  ALBUMIN 4.2    Basename 11/13/11 0615 11/12/11 2253  LIPASE 495* 622*  AMYLASE -- --    Alvira Philips 11/13/11 0615 11/12/11 2253  WBC 10.0 10.7*  NEUTROABS -- --  HGB 12.0 12.7  HCT 35.9* 36.8  MCV 89.3 87.6  PLT 234 257    Basename 11/12/11 2253  CKTOTAL 41  CKMB 2.0  CKMBINDEX --  TROPONINI <0.30   No components found with this basename: POCBNP:3 No results found for this basename: DDIMER:2 in the last 72 hours No results found for this basename: HGBA1C:2 in the last 72 hours No results found for this basename: CHOL:2,HDL:2,LDLCALC:2,TRIG:2,CHOLHDL:2,LDLDIRECT:2 in the last 72 hours No results found for this basename: TSH,T4TOTAL,FREET3,T3FREE,THYROIDAB in the last 72 hours No results found  for this basename: VITAMINB12:2,FOLATE:2,FERRITIN:2,TIBC:2,IRON:2,RETICCTPCT:2 in the last 72 hours  Micro Results: No results found for this or any previous visit (from the past 240 hour(s)).  Studies/Results: Dg Chest 2 View  11/12/2011  *RADIOLOGY REPORT*  Clinical Data: Abdominal pain, hypertension  CHEST - 2 VIEW  Comparison: 07/30/2008  Findings:Improved aeration in the lower lobes.  Lungs clear.  Heart size normal.  Atheromatous aortic arch.  No effusion.  Vascular clips in the upper abdomen.  Regional bones unremarkable.  IMPRESSION: .1.  No acute disease  Original Report Authenticated By: Osa Craver, M.D.    Medications: I have reviewed the patient's current medications. Scheduled Meds:   . antiseptic oral rinse  15 mL Mouth Rinse q12n4p  . chlorhexidine  15 mL Mouth Rinse BID  . ciprofloxacin  400 mg Intravenous Once  . ciprofloxacin  400 mg Intravenous Q12H  . fenofibrate  160 mg Oral Daily  . fentaNYL  50 mcg Intravenous Once  . fentaNYL  50 mcg Intravenous Once  . gadobenate dimeglumine  12 mL Intravenous Once  . latanoprost  1 drop Both Eyes QHS  . lip balm      . lisinopril  5 mg Oral Daily  . methylphenidate  5 mg Oral BID WC  . mulitivitamin with minerals  1 tablet Oral Daily  . ondansetron      . ondansetron      . ondansetron  4 mg Intravenous Once  . ondansetron  4 mg Intravenous Once  . pantoprazole  40 mg Oral Q1200  . simvastatin  40 mg Oral QPM   Continuous Infusions:   . dextrose 5 % and 0.9 % NaCl with KCl 20 mEq/L 125 mL/hr at 11/13/11 0511   PRN Meds:.ALPRAZolam, HYDROmorphone, hyoscyamine, ondansetron (ZOFRAN) IV, ondansetron  Assessment/Plan: Active Problems:  Pancreatitis- d/c NPO will give clears as tolerated, IVF, s/p MRCP- await results  UTI (lower urinary tract infection)- cipro- does not appear culture was done before antibiotics given  DM (diabetes mellitus)- SSI, D/C D5- BS have been high- restart amaryl once eating  consistantly  HTN (hypertension)- stable  Hypercholesteremia  Glaucoma (increased eye pressure)- continue eye drops Hyponatremia- corrects with BS  Dispo: Depending on MRCP- will change plan from there   ADDENDUM- MRCP can back positive with stone impacting duct Dr. Elnoria Howard notified for ERCP- will update patient   LOS: 1 day  VANN, JESSICA, DO 11/13/2011, 12:20 PM

## 2011-11-13 NOTE — ED Provider Notes (Signed)
History     CSN: [id number]  Arrival date & time 11/12/11  2206   First MD Initiated Contact with Patient 11/12/11 2309      Chief Complaint  Patient presents with  . Abdominal Pain    (Consider location/radiation/quality/duration/timing/severity/associated sxs/prior treatment) Patient is a 78 y.o. female presenting with abdominal pain. The history is provided by the patient and the spouse. No language interpreter was used.  Abdominal Pain The primary symptoms of the illness include abdominal pain, fatigue and nausea. The primary symptoms of the illness do not include fever, shortness of breath, vomiting, diarrhea, hematemesis or dysuria. The current episode started 13 to 24 hours ago. The onset of the illness was sudden. The problem has not changed since onset. The abdominal pain is located in the RUQ and epigastric region. The abdominal pain radiates to the right flank. The severity of the abdominal pain is 10/10. The abdominal pain is relieved by nothing. Exacerbated by: nothing, denies [facility] states this is like her previous pancreatitis.  Associated with: nothing. The patient states that she believes she is currently not pregnant. The patient has not had a change in bowel habit. Risk factors for an acute abdominal problem include being elderly and a history of abdominal surgery. Symptoms associated with the illness do not include chills or back pain. Significant associated medical issues include diabetes.    Past Medical History  Diagnosis Date  . Pancreatitis   . Hypertension   . Diabetes mellitus   . Glaucoma   . High cholesterol   . GERD (gastroesophageal reflux disease)     Past Surgical History  Procedure Date  . Appendectomy   . Abdominal hysterectomy   . Cholecystectomy     No family history on file.  History  Substance Use Topics  . Smoking status: Never Smoker   . Smokeless tobacco: Not on file  . Alcohol Use: No    OB History    Grav Para Term Preterm  Abortions TAB SAB Ect Mult Living                  Review of Systems  Constitutional: Positive for fatigue. Negative for fever and chills.  HENT: Negative.   Eyes: Negative.   Respiratory: Negative for shortness of breath.   Cardiovascular: Negative for chest pain and palpitations.  Gastrointestinal: Positive for nausea and abdominal pain. Negative for vomiting, diarrhea, abdominal distention and hematemesis.  Genitourinary: Negative for dysuria.  Musculoskeletal: Negative for back pain.  Neurological: Negative.   Hematological: Negative.   Psychiatric/Behavioral: Negative.     Allergies  Penicillins and Sulfa antibiotics  Home Medications   Current Outpatient Rx  Name Route Sig Dispense Refill  . ACETAMINOPHEN 500 MG PO TABS Oral Take 500 mg by mouth every 6 (six) hours as needed. Patient used this medication for pain.    Marland Kitchen ALPRAZOLAM 1 MG PO TABS Oral Take 1 mg by mouth at bedtime as needed.    . FENOFIBRATE 160 MG PO TABS Oral Take 160 mg by mouth daily.    Marland Kitchen GLIMEPIRIDE 4 MG PO TABS Oral Take 4 mg by mouth daily before breakfast.    . HYOSCYAMINE SULFATE 0.125 MG PO TBDP Sublingual Place under the tongue.    Marland Kitchen LACTASE PO Oral Take 1 tablet by mouth daily.    Marland Kitchen XALATAN OP Ophthalmic Apply 1 drop to eye once. Patient uses this medication in each eye at night.    Marland Kitchen LISINOPRIL 5 MG PO TABS Oral Take  5 mg by mouth daily.    . METHYLPHENIDATE HCL 5 MG PO TABS Oral Take 5 mg by mouth 2 (two) times daily.    . CENTRUM SILVER PO Oral Take 1 tablet by mouth daily.    Marland Kitchen OMEPRAZOLE 20 MG PO CPDR Oral Take 20 mg by mouth daily.    Marland Kitchen SIMVASTATIN 40 MG PO TABS Oral Take 40 mg by mouth every evening.      BP 137/65  Pulse 95  Temp(Src) 98.3 F (36.8 C) (Oral)  Resp 18  Ht 5' (1.524 m)  Wt 134 lb (60.782 kg)  BMI 26.17 kg/m2  SpO2 98%  Physical Exam  Constitutional: She is oriented to person, place, and time. She appears well-developed and well-nourished.  HENT:  Head:  Normocephalic and atraumatic.  Mouth/Throat: Oropharynx is clear and moist.  Eyes: Conjunctivae are normal. Pupils are equal, round, and reactive to light.  Neck: Normal range of motion. Neck supple.  Cardiovascular: Normal rate and regular rhythm.   Pulmonary/Chest: Effort normal and breath sounds normal. She has no wheezes. She has no rales.  Abdominal: Soft. Bowel sounds are normal. There is tenderness in the epigastric area. There is no rebound and no guarding.  Musculoskeletal: Normal range of motion. She exhibits no edema.  Neurological: She is alert and oriented to person, place, and time.  Skin: Skin is warm and dry. She is not diaphoretic.  Psychiatric: She has a normal mood and affect.    ED Course  Procedures (including critical care time)  Labs Reviewed  URINALYSIS, ROUTINE W REFLEX MICROSCOPIC - Abnormal; Notable for the following:    APPearance CLOUDY (*)    Nitrite POSITIVE (*)    Leukocytes, UA MODERATE (*)    All other components within normal limits  BASIC METABOLIC PANEL - Abnormal; Notable for the following:    Sodium 134 (*)    Glucose, Bld 190 (*)    Creatinine, Ser 1.20 (*)    GFR calc non Af Amer 42 (*)    GFR calc Af Amer 48 (*)    All other components within normal limits  CBC - Abnormal; Notable for the following:    WBC 10.7 (*)    All other components within normal limits  HEPATIC FUNCTION PANEL - Abnormal; Notable for the following:    AST 164 (*)    ALT 63 (*)    Indirect Bilirubin 0.2 (*)    All other components within normal limits  LIPASE, BLOOD - Abnormal; Notable for the following:    Lipase 622 (*)    All other components within normal limits  URINE MICROSCOPIC-ADD ON - Abnormal; Notable for the following:    Squamous Epithelial / LPF FEW (*)    Bacteria, UA MANY (*)    All other components within normal limits  CARDIAC PANEL(CRET KIN+CKTOT+MB+TROPI)   Dg Chest 2 View  11/12/2011  *RADIOLOGY REPORT*  Clinical Data: Abdominal pain,  hypertension  CHEST - 2 VIEW  Comparison: 07/30/2008  Findings:Improved aeration in the lower lobes.  Lungs clear.  Heart size normal.  Atheromatous aortic arch.  No effusion.  Vascular clips in the upper abdomen.  Regional bones unremarkable.  IMPRESSION: .1.  No acute disease  Original Report Authenticated By: Thora Lance III, M.D.     1. Pancreatitis   2. UTI (lower urinary tract infection)       MDM   Date: 11/13/2011  Rate: 82  Rhythm: normal sinus rhythm  QRS Axis: normal  Intervals: normal  ST/T Wave abnormalities: normal  Conduction Disutrbances:none  Narrative Interpretation:   Old EKG Reviewed: changes noted         Terissa Haffey K Ellen Mayol-Rasch, MD 11/13/11 [phone]

## 2011-11-13 NOTE — Interval H&P Note (Signed)
History and Physical Interval Note:  11/13/2011 3:51 PM  [name] [name]  has presented today for surgery, with the diagnosis of CBD stone  The various methods of treatment have been discussed with the patient and family. After consideration of risks, benefits and other options for treatment, the patient has consented to  Procedure(s): ENDOSCOPIC RETROGRADE CHOLANGIOPANCREATOGRAPHY (ERCP) as a surgical intervention .  The patients' history has been reviewed, patient examined, no change in status, stable for surgery.  I have reviewed the patients' chart and labs.  Questions were answered to the patient's satisfaction.     [name] D

## 2011-11-13 NOTE — Consult Note (Signed)
Reason for Consult: Gallstone pancreatitis Referring Physician: [facility]  Cristobal Goldmann HPI: This is a 78 year old female with a PMH of pancreatitis in 08/2008 and she is s/p lap chole who presented with an acute onset of nausea/vomiting and epigastric pain.  The patient had the lap chole before her bout of pancreatitis.  Further evaluation revealed that she has an elevated lipase in the 600 range.  An MRCP was performed and it was revealing for a 12 mm CBD and an impacted 7 mm stone in the distal CBD.  GI is consulted to perform an ERCP.  Past Medical History  Diagnosis Date  . Pancreatitis   . Hypertension   . Diabetes mellitus   . Glaucoma   . High cholesterol   . GERD (gastroesophageal reflux disease)     Past Surgical History  Procedure Date  . Appendectomy   . Abdominal hysterectomy   . Cholecystectomy     No family history on file.  Social History:  reports that she has never smoked. She has never used smokeless tobacco. She reports that she does not drink alcohol or use illicit drugs.  Allergies:  Allergies  Allergen Reactions  . Lactose Intolerance (Gi)     Nausea and vomiting, severe stomach pain  . Penicillins Hives and Itching  . Sulfa Antibiotics Hives and Itching    Medications:  Scheduled:   . sodium chloride   Intravenous Once  . antiseptic oral rinse  15 mL Mouth Rinse q12n4p  . chlorhexidine  15 mL Mouth Rinse BID  . ciprofloxacin  400 mg Intravenous Once  . ciprofloxacin  400 mg Intravenous Q12H  . fenofibrate  160 mg Oral Daily  . fentaNYL  50 mcg Intravenous Once  . fentaNYL  50 mcg Intravenous Once  . gadobenate dimeglumine  12 mL Intravenous Once  . insulin aspart  0-9 Units Subcutaneous TID WC  . latanoprost  1 drop Both Eyes QHS  . lip balm      . lisinopril  5 mg Oral Daily  . methylphenidate  5 mg Oral BID WC  . mulitivitamin with minerals  1 tablet Oral Daily  . ondansetron      . ondansetron      . ondansetron  4 mg  Intravenous Once  . ondansetron  4 mg Intravenous Once  . pantoprazole  40 mg Oral Q1200  . simvastatin  40 mg Oral QPM  . DISCONTD: ciprofloxacin  400 mg Intravenous Once   Continuous:   . sodium chloride    . DISCONTD: dextrose 5 % and 0.9 % NaCl with KCl 20 mEq/L 125 mL/hr at 11/13/11 0511    Results for orders placed during the hospital encounter of 11/12/11 (from the past 24 hour(s))  BASIC METABOLIC PANEL     Status: Abnormal   Collection Time   11/12/11 10:53 PM      Component Value Range   Sodium 134 (*) 135 - 145 (mEq/L)   Potassium 4.1  3.5 - 5.1 (mEq/L)   Chloride 99  96 - 112 (mEq/L)   CO2 22  19 - 32 (mEq/L)   Glucose, Bld 190 (*) 70 - 99 (mg/dL)   BUN 22  6 - 23 (mg/dL)   Creatinine, Ser 9.62 (*) 0.50 - 1.10 (mg/dL)   Calcium 9.9  8.4 - 95.2 (mg/dL)   GFR calc non Af Amer 42 (*) >90 (mL/min)   GFR calc Af Amer 48 (*) >90 (mL/min)  CARDIAC PANEL(CRET KIN+CKTOT+MB+TROPI)  Status: Normal   Collection Time   11/12/11 10:53 PM      Component Value Range   Total CK 41  7 - 177 (U/L)   CK, MB 2.0  0.3 - 4.0 (ng/mL)   Troponin I <0.30  <0.30 (ng/mL)   Relative Index RELATIVE INDEX IS INVALID  0.0 - 2.5   CBC     Status: Abnormal   Collection Time   11/12/11 10:53 PM      Component Value Range   WBC 10.7 (*) 4.0 - 10.5 (K/uL)   RBC 4.20  3.87 - 5.11 (MIL/uL)   Hemoglobin 12.7  12.0 - 15.0 (g/dL)   HCT 11.9  14.7 - 82.9 (%)   MCV 87.6  78.0 - 100.0 (fL)   MCH 30.2  26.0 - 34.0 (pg)   MCHC 34.5  30.0 - 36.0 (g/dL)   RDW 56.2  13.0 - 86.5 (%)   Platelets 257  150 - 400 (K/uL)  HEPATIC FUNCTION PANEL     Status: Abnormal   Collection Time   11/12/11 10:53 PM      Component Value Range   Total Protein 7.4  6.0 - 8.3 (g/dL)   Albumin 4.2  3.5 - 5.2 (g/dL)   AST 784 (*) 0 - 37 (U/L)   ALT 63 (*) 0 - 35 (U/L)   Alkaline Phosphatase 57  39 - 117 (U/L)   Total Bilirubin 0.5  0.3 - 1.2 (mg/dL)   Bilirubin, Direct 0.3  0.0 - 0.3 (mg/dL)   Indirect Bilirubin 0.2 (*) 0.3  - 0.9 (mg/dL)  LIPASE, BLOOD     Status: Abnormal   Collection Time   11/12/11 10:53 PM      Component Value Range   Lipase 622 (*) 11 - 59 (U/L)  URINALYSIS, ROUTINE W REFLEX MICROSCOPIC     Status: Abnormal   Collection Time   11/12/11 11:37 PM      Component Value Range   Color, Urine YELLOW  YELLOW    APPearance CLOUDY (*) CLEAR    Specific Gravity, Urine 1.015  1.005 - 1.030    pH 6.5  5.0 - 8.0    Glucose, UA NEGATIVE  NEGATIVE (mg/dL)   Hgb urine dipstick NEGATIVE  NEGATIVE    Bilirubin Urine NEGATIVE  NEGATIVE    Ketones, ur NEGATIVE  NEGATIVE (mg/dL)   Protein, ur NEGATIVE  NEGATIVE (mg/dL)   Urobilinogen, UA 0.2  0.0 - 1.0 (mg/dL)   Nitrite POSITIVE (*) NEGATIVE    Leukocytes, UA MODERATE (*) NEGATIVE   URINE MICROSCOPIC-ADD ON     Status: Abnormal   Collection Time   11/12/11 11:37 PM      Component Value Range   Squamous Epithelial / LPF FEW (*) RARE    WBC, UA 21-50  <3 (WBC/hpf)   RBC / HPF 0-2  <3 (RBC/hpf)   Bacteria, UA MANY (*) RARE   BASIC METABOLIC PANEL     Status: Abnormal   Collection Time   11/13/11  6:15 AM      Component Value Range   Sodium 134 (*) 135 - 145 (mEq/L)   Potassium 4.4  3.5 - 5.1 (mEq/L)   Chloride 98  96 - 112 (mEq/L)   CO2 23  19 - 32 (mEq/L)   Glucose, Bld 212 (*) 70 - 99 (mg/dL)   BUN 20  6 - 23 (mg/dL)   Creatinine, Ser 6.96  0.50 - 1.10 (mg/dL)   Calcium 9.7  8.4 - 29.5 (mg/dL)  GFR calc non Af Amer 51 (*) >90 (mL/min)   GFR calc Af Amer 59 (*) >90 (mL/min)  CBC     Status: Abnormal   Collection Time   11/13/11  6:15 AM      Component Value Range   WBC 10.0  4.0 - 10.5 (K/uL)   RBC 4.02  3.87 - 5.11 (MIL/uL)   Hemoglobin 12.0  12.0 - 15.0 (g/dL)   HCT 16.1 (*) 09.6 - 46.0 (%)   MCV 89.3  78.0 - 100.0 (fL)   MCH 29.9  26.0 - 34.0 (pg)   MCHC 33.4  30.0 - 36.0 (g/dL)   RDW 04.5  40.9 - 81.1 (%)   Platelets 234  150 - 400 (K/uL)  MAGNESIUM     Status: Normal   Collection Time   11/13/11  6:15 AM      Component Value Range    Magnesium 1.9  1.5 - 2.5 (mg/dL)  PHOSPHORUS     Status: Normal   Collection Time   11/13/11  6:15 AM      Component Value Range   Phosphorus 3.3  2.3 - 4.6 (mg/dL)  LIPASE, BLOOD     Status: Abnormal   Collection Time   11/13/11  6:15 AM      Component Value Range   Lipase 495 (*) 11 - 59 (U/L)     Dg Chest 2 View  11/12/2011  *RADIOLOGY REPORT*  Clinical Data: Abdominal pain, hypertension  CHEST - 2 VIEW  Comparison: 07/30/2008  Findings:Improved aeration in the lower lobes.  Lungs clear.  Heart size normal.  Atheromatous aortic arch.  No effusion.  Vascular clips in the upper abdomen.  Regional bones unremarkable.  IMPRESSION: .1.  No acute disease  Original Report Authenticated By: Osa Craver, M.D.   Mr 3d Recon At Scanner  11/13/2011  *RADIOLOGY REPORT*  Clinical Data:  Epigastric pain.  Nausea and vomiting. Pancreatitis.  Previous cholecystectomy.  MRI ABDOMEN WITHOUT AND WITH CONTRAST (MRCP)  Technique:  Multiplanar multisequence MR imaging of the abdomen was performed without and with contrast, including heavily T2-weighted images of the biliary and pancreatic ducts.  Three-dimensional MR images were rendered by post processing of the original MR data.  Contrast: 12mL MULTIHANCE GADOBENATE DIMEGLUMINE 529 MG/ML IV SOLN  Comparison:  Noncontrast CT on 07/24/2008  Findings:  Gallbladder is surgically absent.  Mild diffuse biliary ductal dilatation is seen with common bile duct measuring 12 mm. A low T2 signal focus is seen at the ampulla which has a convex contour with fluid in the distal common bile duct. This measures 7 mm and is suspicious for impacted distal common bile duct stone. No other intraductal calculi are seen there is no evidence of biliary stricture.  No evidence of pancreatic ductal dilatation or pancreas divisum.  The pancreas is normal in appearance, and there is no evidence of pancreatic mass or peripancreatic inflammatory changes.  The liver, spleen, and adrenal glands  are normal in appearance.  A simple right upper pole renal cyst is seen measuring 3.8 cm, however there is no evidence of renal mass or hydronephrosis.  No other abdominal soft tissue masses or lymphadenopathy identified.  No evidence of inflammatory process or abnormal fluid collections.  IMPRESSION:  1.  Mild diffuse biliary ductal dilatation with common bile duct measuring 12 mm in diameter. 2.  Probable 7 mm distal common bile duct stone impacted at the ampulla.  Consider ERCP for further evaluation. 2.  No evidence of  pancreatitis, pancreatic mass, or other acute findings.  Original Report Authenticated By: Danae Orleans, M.D.   Mr Abd W/wo Cm/mrcp  11/13/2011  *RADIOLOGY REPORT*  Clinical Data:  Epigastric pain.  Nausea and vomiting. Pancreatitis.  Previous cholecystectomy.  MRI ABDOMEN WITHOUT AND WITH CONTRAST (MRCP)  Technique:  Multiplanar multisequence MR imaging of the abdomen was performed without and with contrast, including heavily T2-weighted images of the biliary and pancreatic ducts.  Three-dimensional MR images were rendered by post processing of the original MR data.  Contrast: 12mL MULTIHANCE GADOBENATE DIMEGLUMINE 529 MG/ML IV SOLN  Comparison:  Noncontrast CT on 07/24/2008  Findings:  Gallbladder is surgically absent.  Mild diffuse biliary ductal dilatation is seen with common bile duct measuring 12 mm. A low T2 signal focus is seen at the ampulla which has a convex contour with fluid in the distal common bile duct. This measures 7 mm and is suspicious for impacted distal common bile duct stone. No other intraductal calculi are seen there is no evidence of biliary stricture.  No evidence of pancreatic ductal dilatation or pancreas divisum.  The pancreas is normal in appearance, and there is no evidence of pancreatic mass or peripancreatic inflammatory changes.  The liver, spleen, and adrenal glands are normal in appearance.  A simple right upper pole renal cyst is seen measuring 3.8 cm,  however there is no evidence of renal mass or hydronephrosis.  No other abdominal soft tissue masses or lymphadenopathy identified.  No evidence of inflammatory process or abnormal fluid collections.  IMPRESSION:  1.  Mild diffuse biliary ductal dilatation with common bile duct measuring 12 mm in diameter. 2.  Probable 7 mm distal common bile duct stone impacted at the ampulla.  Consider ERCP for further evaluation. 2.  No evidence of pancreatitis, pancreatic mass, or other acute findings.  Original Report Authenticated By: Danae Orleans, M.D.    ROS:  As stated above in the HPI otherwise negative.  Blood pressure 116/54, pulse 77, temperature 98 F (36.7 C), temperature source Oral, resp. rate 18, height 5' (1.524 m), weight 60.782 kg (134 lb), SpO2 98.00%.    PE: Gen: NAD, Alert and Oriented HEENT:  [place]/AT, EOMI Neck: Supple, no LAD Lungs: CTA Bilaterally CV: RRR without M/G/R ABM: Soft, NTND, +BS Ext: No C/C/E  Assessment/Plan: 1) Choledocholithiasis. 2) Gallstone pancreatitis.  Plan: 1) ERCP today.  Kenlynn Houde D 11/13/2011, 2:57 PM

## 2011-11-13 NOTE — Progress Notes (Signed)
Inpatient Diabetes Program Recommendations  AACE/ADA: New Consensus Statement on Inpatient Glycemic Control (2009)  Target Ranges:  Prepandial:   less than 140 mg/dL      Peak postprandial:   less than 180 mg/dL (1-2 hours)      Critically ill patients:  140 - 180 mg/dL   Reason for Visit: Glucose results greater than 180 mg/dl  Inpatient Diabetes Program Recommendations Correction (SSI): Start Novolog SENSITIVE correction scale every 4 hours while NPO then AC & HS if CBGs continue greater than  180mg /dl  Note: Check CBGs every 4 hours while NPO and then AC & HS when eating

## 2011-11-13 NOTE — H&P (Signed)
Chief Complaint: Abdominal pain   HPI: [name] [name] is an 78 y.o. female with history of diabetes, hypertension, hypercholesterolemia, prior pancreatitis 4 years ago, status post cholecystectomy, presents to [facility] complaining of epigastric pain and bilateral flank pain for 2 days. She denied any fever, chills, shortness of breath or chest pain. She does have some nausea and vomited twice in the emergency room of bilious material. She denied any new medication or any alcohol use. Evaluation in emergency room included a lipase of 622, creatinine of 1.2, blood glucose of 190. Her urinalysis is strongly positive for urinary tract infection with 21-50 WBC, many bacteria, and positive nitrite. Her liver function tests was also elevated. Hospitalist was asked to accept patient in transfer for acute pancreatitis and UTI.  Rewiew of Systems:  The patient denies fever, weight loss,, vision loss, decreased hearing, hoarseness, chest pain, syncope, dyspnea on exertion, peripheral edema, balance deficits, hemoptysis, , melena, hematochezia, severe indigestion/heartburn, hematuria, incontinence, genital sores, muscle weakness, suspicious skin lesions, transient blindness, difficulty walking, depression, unusual weight change, abnormal bleeding, enlarged lymph nodes, angioedema, and breast masses.    Past Medical History  Diagnosis Date  . Pancreatitis   . Hypertension   . Diabetes mellitus   . Glaucoma   . High cholesterol   . GERD (gastroesophageal reflux disease)     Past Surgical History  Procedure Date  . Appendectomy   . Abdominal hysterectomy   . Cholecystectomy     Medications:  HOME MEDS: Prior to Admission medications   Medication Sig Start Date End Date Taking? Authorizing Provider  acetaminophen (TYLENOL) 500 MG tablet Take 500 mg by mouth every 6 (six) hours as needed. Patient used this medication for pain.   Yes Historical Provider, MD  ALPRAZolam  Prudy Feeler) 1 MG tablet Take 1 mg by mouth at bedtime as needed.   Yes Historical Provider, MD  fenofibrate 160 MG tablet Take 160 mg by mouth daily.   Yes Historical Provider, MD  glimepiride (AMARYL) 4 MG tablet Take 4 mg by mouth daily before breakfast.   Yes Historical Provider, MD  hyoscyamine (ANASPAZ) 0.125 MG TBDP Place under the tongue.   Yes Historical Provider, MD  LACTASE PO Take 1 tablet by mouth daily.   Yes Historical Provider, MD  Latanoprost (XALATAN OP) Apply 1 drop to eye once. Patient uses this medication in each eye at night.   Yes Historical Provider, MD  lisinopril (PRINIVIL,ZESTRIL) 5 MG tablet Take 5 mg by mouth daily.   Yes Historical Provider, MD  methylphenidate (RITALIN) 5 MG tablet Take 5 mg by mouth 2 (two) times daily.   Yes Historical Provider, MD  Multiple Vitamins-Minerals (CENTRUM SILVER PO) Take 1 tablet by mouth daily.   Yes Historical Provider, MD  omeprazole (PRILOSEC) 20 MG capsule Take 20 mg by mouth daily.   Yes Historical Provider, MD  simvastatin (ZOCOR) 40 MG tablet Take 40 mg by mouth every evening.   Yes Historical Provider, MD     Allergies:  Allergies  Allergen Reactions  . Penicillins Hives and Itching  . Sulfa Antibiotics Hives and Itching    Social History:   reports that she has never smoked. She has never used smokeless tobacco. She reports that she does not drink alcohol or use illicit drugs.  Family History: No family history on file.   Physical Exam: Filed Vitals:   11/12/11 2213 11/12/11 2220 11/13/11 0143 11/13/11 0230  BP: 149/62 137/65 121/55 129/57  Pulse: 98  95 83 76  Temp: 98.1 F (36.7 C) 98.3 F (36.8 C) 98.4 F (36.9 C) 97.8 F (36.6 C)  TempSrc: Oral Oral Oral   Resp: 18 18 16 18   Height: 5' (1.524 m) 5' (1.524 m)    Weight: 47.174 kg (104 lb) 60.782 kg (134 lb)    SpO2: 98% 98% 95% 96%   Blood pressure 129/57, pulse 76, temperature 97.8 F (36.6 C), temperature source Oral, resp. rate 18, height 5' (1.524  m), weight 60.782 kg (134 lb), SpO2 96.00%.  GEN:  Pleasant person lying in the stretcher in no acute distress; cooperative with exam PSYCH:  alert and oriented x4; does not appear anxious does not appear depressed; affect is normal HEENT: Mucous membranes pink and anicteric; PERRLA; EOM intact; no cervical lymphadenopathy nor thyromegaly or carotid bruit; no JVD; Breasts:: Not examined CHEST WALL: No tenderness CHEST: Normal respiration, clear to auscultation bilaterally HEART: Regular rate and rhythm; no murmurs rubs or gallops BACK: No kyphosis or scoliosis; no CVA tenderness ABDOMEN: Obese, soft non-tender; no masses, no organomegaly, normal abdominal bowel sounds; no pannus; no intertriginous candida. Rectal Exam: Not done EXTREMITIES: No bone or joint deformity; age-appropriate arthropathy of the hands and knees; no edema; no ulcerations. Genitalia: not examined PULSES: 2+ and symmetric SKIN: Normal hydration no rash or ulceration CNS: Cranial nerves 2-12 grossly intact no focal neurologic deficit   Labs & Imaging Results for orders placed during the hospital encounter of 11/12/11 (from the past 48 hour(s))  BASIC METABOLIC PANEL     Status: Abnormal   Collection Time   11/12/11 10:53 PM      Component Value Range Comment   Sodium 134 (*) 135 - 145 (mEq/L)    Potassium 4.1  3.5 - 5.1 (mEq/L)    Chloride 99  96 - 112 (mEq/L)    CO2 22  19 - 32 (mEq/L)    Glucose, Bld 190 (*) 70 - 99 (mg/dL)    BUN 22  6 - 23 (mg/dL)    Creatinine, Ser 4.09 (*) 0.50 - 1.10 (mg/dL)    Calcium 9.9  8.4 - 10.5 (mg/dL)    GFR calc non Af Amer 42 (*) >90 (mL/min)    GFR calc Af Amer 48 (*) >90 (mL/min)   CARDIAC PANEL(CRET KIN+CKTOT+MB+TROPI)     Status: Normal   Collection Time   11/12/11 10:53 PM      Component Value Range Comment   Total CK 41  7 - 177 (U/L)    CK, MB 2.0  0.3 - 4.0 (ng/mL)    Troponin I <0.30  <0.30 (ng/mL)    Relative Index RELATIVE INDEX IS INVALID  0.0 - 2.5    CBC      Status: Abnormal   Collection Time   11/12/11 10:53 PM      Component Value Range Comment   WBC 10.7 (*) 4.0 - 10.5 (K/uL)    RBC 4.20  3.87 - 5.11 (MIL/uL)    Hemoglobin 12.7  12.0 - 15.0 (g/dL)    HCT 81.1  91.4 - 78.2 (%)    MCV 87.6  78.0 - 100.0 (fL)    MCH 30.2  26.0 - 34.0 (pg)    MCHC 34.5  30.0 - 36.0 (g/dL)    RDW 95.6  21.3 - 08.6 (%)    Platelets 257  150 - 400 (K/uL)   HEPATIC FUNCTION PANEL     Status: Abnormal   Collection Time   11/12/11 10:53 PM  Component Value Range Comment   Total Protein 7.4  6.0 - 8.3 (g/dL)    Albumin 4.2  3.5 - 5.2 (g/dL)    AST 161 (*) 0 - 37 (U/L)    ALT 63 (*) 0 - 35 (U/L)    Alkaline Phosphatase 57  39 - 117 (U/L)    Total Bilirubin 0.5  0.3 - 1.2 (mg/dL)    Bilirubin, Direct 0.3  0.0 - 0.3 (mg/dL)    Indirect Bilirubin 0.2 (*) 0.3 - 0.9 (mg/dL)   LIPASE, BLOOD     Status: Abnormal   Collection Time   11/12/11 10:53 PM      Component Value Range Comment   Lipase 622 (*) 11 - 59 (U/L)   URINALYSIS, ROUTINE W REFLEX MICROSCOPIC     Status: Abnormal   Collection Time   11/12/11 11:37 PM      Component Value Range Comment   Color, Urine YELLOW  YELLOW     APPearance CLOUDY (*) CLEAR     Specific Gravity, Urine 1.015  1.005 - 1.030     pH 6.5  5.0 - 8.0     Glucose, UA NEGATIVE  NEGATIVE (mg/dL)    Hgb urine dipstick NEGATIVE  NEGATIVE     Bilirubin Urine NEGATIVE  NEGATIVE     Ketones, ur NEGATIVE  NEGATIVE (mg/dL)    Protein, ur NEGATIVE  NEGATIVE (mg/dL)    Urobilinogen, UA 0.2  0.0 - 1.0 (mg/dL)    Nitrite POSITIVE (*) NEGATIVE     Leukocytes, UA MODERATE (*) NEGATIVE    URINE MICROSCOPIC-ADD ON     Status: Abnormal   Collection Time   11/12/11 11:37 PM      Component Value Range Comment   Squamous Epithelial / LPF FEW (*) RARE     WBC, UA 21-50  <3 (WBC/hpf)    RBC / HPF 0-2  <3 (RBC/hpf)    Bacteria, UA MANY (*) RARE     Dg Chest 2 View  11/12/2011  *RADIOLOGY REPORT*  Clinical Data: Abdominal pain, hypertension  CHEST - 2  VIEW  Comparison: 07/30/2008  Findings:Improved aeration in the lower lobes.  Lungs clear.  Heart size normal.  Atheromatous aortic arch.  No effusion.  Vascular clips in the upper abdomen.  Regional bones unremarkable.  IMPRESSION: .1.  No acute disease  Original Report Authenticated By: Osa Craver, M.D.      Assessment Present on Admission:  .DM (diabetes mellitus) .HTN (hypertension) .Hypercholesteremia .Glaucoma (increased eye pressure)   PLAN: Will admit her to general medical floor. Will rest her bowel with n.p.o. and IV fluids. For her diabetes, since she is not eating, will DC Amaryl. She will be given Cipro for UTI given she is allergic to cephalosporin. I will order an MRCP to look at her pancreatic duct. Her abdominal exam is totally benign. Will continue her home medications including her eye drops for glaucoma. She is stable, full code, and will be admitted to [facility].  Other plans as per orders.    Fajr Fife 11/13/2011, 4:02 AM

## 2011-11-13 NOTE — Progress Notes (Signed)
Received patient from ENDO post ERCP. Patient is alert and oriented, verbalizes relief from pain. VSS .will continue to monitor.

## 2011-11-14 DIAGNOSIS — Z9889 Other specified postprocedural states: Secondary | ICD-10-CM

## 2011-11-14 DIAGNOSIS — K859 Acute pancreatitis without necrosis or infection, unspecified: Principal | ICD-10-CM

## 2011-11-14 LAB — GLUCOSE, CAPILLARY
Glucose-Capillary: 193 mg/dL — ABNORMAL HIGH (ref 70–99)
Glucose-Capillary: 174 mg/dL — ABNORMAL HIGH (ref 70–99)
Glucose-Capillary: 125 mg/dL — ABNORMAL HIGH (ref 70–99)

## 2011-11-14 LAB — COMPREHENSIVE METABOLIC PANEL
Albumin: 3.7 g/dL (ref 3.5–5.2)
Alkaline Phosphatase: 54 U/L (ref 39–117)
BUN: 14 mg/dL (ref 6–23)
Chloride: 101 mEq/L (ref 96–112)
Glucose, Bld: 151 mg/dL — ABNORMAL HIGH (ref 70–99)
Potassium: 3.8 mEq/L (ref 3.5–5.1)
Total Bilirubin: 0.4 mg/dL (ref 0.3–1.2)

## 2011-11-14 LAB — CBC
MCV: 89.3 fL (ref 78.0–100.0)
Platelets: 237 10*3/uL (ref 150–400)
RDW: 13.1 % (ref 11.5–15.5)
WBC: 12.6 10*3/uL — ABNORMAL HIGH (ref 4.0–10.5)

## 2011-11-14 MED ORDER — ACETAMINOPHEN 325 MG PO TABS
650.0000 mg | ORAL_TABLET | Freq: Four times a day (QID) | ORAL | Status: DC | PRN
  Administered 2011-11-14 – 2011-11-18 (×6): 650 mg via ORAL
  Filled 2011-11-14 (×6): qty 2

## 2011-11-14 MED ORDER — ENOXAPARIN SODIUM 40 MG/0.4ML ~~LOC~~ SOLN
40.0000 mg | SUBCUTANEOUS | Status: DC
  Administered 2011-11-14 – 2011-11-29 (×16): 40 mg via SUBCUTANEOUS
  Filled 2011-11-14 (×16): qty 0.4

## 2011-11-14 MED ORDER — HYDROMORPHONE HCL PF 1 MG/ML IJ SOLN
0.5000 mg | INTRAMUSCULAR | Status: DC | PRN
  Administered 2011-11-14 – 2011-11-16 (×12): 0.5 mg via INTRAVENOUS
  Filled 2011-11-14 (×16): qty 1

## 2011-11-14 MED ORDER — ALPRAZOLAM 0.5 MG PO TABS
0.5000 mg | ORAL_TABLET | Freq: Four times a day (QID) | ORAL | Status: DC | PRN
  Administered 2011-11-14 – 2011-11-24 (×6): 0.5 mg via ORAL
  Filled 2011-11-14 (×6): qty 1

## 2011-11-14 NOTE — Progress Notes (Signed)
[name] Progress Note - Covering for Dr. Elnoria Howard, [facility]  Subjective: Feels okay now with abd pain 6/10, this am around 0400 had severe pain 10/10. Improved with IV pain meds Ate a bit of jello this am with some nausea No vomiting. No flatus or BM since ERCP No fevers.  Objective:  Vital signs in last 24 hours: Temp:  [98 F (36.7 C)-98.9 F (37.2 C)] 98.4 F (36.9 C) (02/09 1010) Pulse Rate:  [76-82] 80  (02/09 1010) Resp:  [6-50] 18  (02/09 1010) BP: (109-146)/(45-81) 121/54 mmHg (02/09 1010) SpO2:  [92 %-100 %] 97 % (02/09 1010) Weight:  [131 lb 12.8 oz (59.784 kg)] 131 lb 12.8 oz (59.784 kg) (02/09 0552) Last BM Date: 11/12/11 General:   Alert,  Well-developed,  white female in NAD Heart:  Regular rate and rhythm; no murmurs Chest: CTA b/l Abdomen:  Soft, mild epigastric tenderness, mildly distended. Hypoactive but present BS without guarding, and without rebound.   Extremities:  Without edema. Neurologic:  Alert and  oriented x4;  grossly normal neurologically. Psych:  Alert and cooperative. Normal mood and affect.  Intake/Output from previous day: 02/08 0701 - 02/09 0700 In: 2780.8 [I.V.:2380.8; IV Piggyback:400] Out: 550 [Urine:350; Stool:200] Intake/Output this shift: Total I/O In: -  Out: 200 [Urine:200]  Lab Results:  Basename 11/14/11 0510 11/13/11 0615 11/12/11 2253  WBC 12.6* 10.0 10.7*  HGB 12.6 12.0 12.7  HCT 37.7 35.9* 36.8  PLT 237 234 257   BMET  Basename 11/14/11 0510 11/13/11 0615 11/12/11 2253  NA 136 134* 134*  K 3.8 4.4 4.1  CL 101 98 99  CO2 25 23 22   GLUCOSE 151* 212* 190*  BUN 14 20 22   CREATININE 1.01 1.02 1.20*  CALCIUM 9.5 9.7 9.9   LFT  Basename 11/14/11 0510 11/12/11 2253  PROT 7.1 --  ALBUMIN 3.7 --  AST 87* --  ALT 62* --  ALKPHOS 54 --  BILITOT 0.4 --  BILIDIR -- 0.3  IBILI -- 0.2*    Studies/Results: Dg Chest 2 View  11/12/2011  *RADIOLOGY REPORT*  Clinical Data: Abdominal pain,  hypertension  CHEST - 2 VIEW  Comparison: 07/30/2008  Findings:Improved aeration in the lower lobes.  Lungs clear.  Heart size normal.  Atheromatous aortic arch.  No effusion.  Vascular clips in the upper abdomen.  Regional bones unremarkable.  IMPRESSION: .1.  No acute disease  Original Report Authenticated By: Osa Craver, M.D.   Mr 3d Recon At Scanner  11/13/2011  *RADIOLOGY REPORT*  Clinical Data:  Epigastric pain.  Nausea and vomiting. Pancreatitis.  Previous cholecystectomy.  MRI ABDOMEN WITHOUT AND WITH CONTRAST (MRCP)  Technique:  Multiplanar multisequence MR imaging of the abdomen was performed without and with contrast, including heavily T2-weighted images of the biliary and pancreatic ducts.  Three-dimensional MR images were rendered by post processing of the original MR data.  Contrast: 12mL MULTIHANCE GADOBENATE DIMEGLUMINE 529 MG/ML IV SOLN  Comparison:  Noncontrast CT on 07/24/2008  Findings:  Gallbladder is surgically absent.  Mild diffuse biliary ductal dilatation is seen with common bile duct measuring 12 mm. A low T2 signal focus is seen at the ampulla which has a convex contour with fluid in the distal common bile duct. This measures 7 mm and is suspicious for impacted distal common bile duct stone. No other intraductal calculi are seen there is no evidence of biliary stricture.  No evidence of pancreatic ductal dilatation or pancreas divisum.  The pancreas is normal  in appearance, and there is no evidence of pancreatic mass or peripancreatic inflammatory changes.  The liver, spleen, and adrenal glands are normal in appearance.  A simple right upper pole renal cyst is seen measuring 3.8 cm, however there is no evidence of renal mass or hydronephrosis.  No other abdominal soft tissue masses or lymphadenopathy identified.  No evidence of inflammatory process or abnormal fluid collections.  IMPRESSION:  1.  Mild diffuse biliary ductal dilatation with common bile duct measuring 12 mm in  diameter. 2.  Probable 7 mm distal common bile duct stone impacted at the ampulla.  Consider ERCP for further evaluation. 2.  No evidence of pancreatitis, pancreatic mass, or other acute findings.  Original Report Authenticated By: Danae Orleans, M.D.   Dg Ercp With Sphincterotomy  11/13/2011  *RADIOLOGY REPORT*  Clinical Data: Cholelithiasis  ERCP  Comparison:  Abdominal MRI 11/13/2011.  Technique:  Multiple spot images obtained with the fluoroscopic device and submitted for interpretation post-procedure.  ERCP was performed by Dr.  Elnoria Howard.  Findings: Fluoroscopic spot images demonstrate cannulation of the common bile duct.  The common bile duct is slightly dilated but no filling defects to suggest common bile duct stones.  Subsequent sphincterotomy and balloon passage are noted.  IMPRESSION: Sphincterotomy and balloon passage.  No definite common bile duct stones.  These images were submitted for radiologic interpretation only. Please see the procedural report for the amount of contrast and the fluoroscopy time utilized.  Original Report Authenticated By: P. Loralie Champagne, M.D.   Dg Abd Portable 2v  11/13/2011  *RADIOLOGY REPORT*  Clinical Data: Rule out perforation.  Status post ERCP.  PORTABLE ABDOMEN - 2 VIEW  Comparison: 11/13/2011 MRI and CT 07/24/2008  Findings: Supine and left lateral decubitus views of the abdomen show residual contrast in the biliary tree and proximal small bowel loops.  Surgical clips overlie the right upper quadrant.  There is no evidence for free intraperitoneal air.  Degenerative changes are identified in the spine.  IMPRESSION:  1.  Contrast following procedure. 2.  No evidence for free air. 3. The findings were discussed with Dr. Elnoria Howard on 11/13/2011 at 5:55 p.m.  Original Report Authenticated By: Patterson Hammersmith, M.D.   Mr Abd W/wo Cm/mrcp  11/13/2011  *RADIOLOGY REPORT*  Clinical Data:  Epigastric pain.  Nausea and vomiting. Pancreatitis.  Previous cholecystectomy.  MRI  ABDOMEN WITHOUT AND WITH CONTRAST (MRCP)  Technique:  Multiplanar multisequence MR imaging of the abdomen was performed without and with contrast, including heavily T2-weighted images of the biliary and pancreatic ducts.  Three-dimensional MR images were rendered by post processing of the original MR data.  Contrast: 12mL MULTIHANCE GADOBENATE DIMEGLUMINE 529 MG/ML IV SOLN  Comparison:  Noncontrast CT on 07/24/2008  Findings:  Gallbladder is surgically absent.  Mild diffuse biliary ductal dilatation is seen with common bile duct measuring 12 mm. A low T2 signal focus is seen at the ampulla which has a convex contour with fluid in the distal common bile duct. This measures 7 mm and is suspicious for impacted distal common bile duct stone. No other intraductal calculi are seen there is no evidence of biliary stricture.  No evidence of pancreatic ductal dilatation or pancreas divisum.  The pancreas is normal in appearance, and there is no evidence of pancreatic mass or peripancreatic inflammatory changes.  The liver, spleen, and adrenal glands are normal in appearance.  A simple right upper pole renal cyst is seen measuring 3.8 cm, however there is no evidence  of renal mass or hydronephrosis.  No other abdominal soft tissue masses or lymphadenopathy identified.  No evidence of inflammatory process or abnormal fluid collections.  IMPRESSION:  1.  Mild diffuse biliary ductal dilatation with common bile duct measuring 12 mm in diameter. 2.  Probable 7 mm distal common bile duct stone impacted at the ampulla.  Consider ERCP for further evaluation. 2.  No evidence of pancreatitis, pancreatic mass, or other acute findings.  Original Report Authenticated By: Danae Orleans, M.D.   Assessment / Plan: 78 yo female with gallstone pancreatitis (previous cholecystectomy) felt secondary to retained stone vs de-novo stone formation in CBD, s/p ERCP  1. Gallstone panc -- had ERCP yesterday, duct cleared. ? Of RP perf, but xray  was okay. Abd exam benign now but with some pain as expected.  Likely with post-ERCP pancreatitis at this point.  Lipase is more elevated and her pain fits with pancreatitis.  For now, would not advance diet past clears, continue IVFs, abx (all with UTI), and pain control.  Expect slow improvement.  AST/ALT/alk phos are down trending which is encouraging. We will follow.   Principal Problem:  *Pancreatitis Active Problems:  UTI (lower urinary tract infection)  DM (diabetes mellitus)  HTN (hypertension)  Hypercholesteremia  Glaucoma (increased eye pressure)  Hyponatremia     LOS: 2 days   [name] [name] M  11/14/2011, 10:50 AM

## 2011-11-14 NOTE — Progress Notes (Signed)
Pt with temp of 102.7 orally. Pt. Asleep in bed, no other complaints. [facility] MD/NP  on call paged. Orders given. Will continue to monitor. Mechele Collin RN

## 2011-11-14 NOTE — Progress Notes (Signed)
Subjective: S/p ERCP, still with occasional nausea and pain in RUQ but able to tolerate 1/2 container of jello  Objective: Vital signs in last 24 hours: Filed Vitals:   11/13/11 2130 11/14/11 0140 11/14/11 0552 11/14/11 1010  BP: 115/45 119/45 111/50 121/54  Pulse: 80 82 82 80  Temp: 98.9 F (37.2 C) 98.9 F (37.2 C) 98.3 F (36.8 C) 98.4 F (36.9 C)  TempSrc:    Oral  Resp: 16 16 16 18   Height:      Weight:   59.784 kg (131 lb 12.8 oz)   SpO2: 93% 96% 96% 97%   Weight change: 12.61 kg (27 lb 12.8 oz)  Intake/Output Summary (Last 24 hours) at 11/14/11 1037 Last data filed at 11/14/11 0742  Gross per 24 hour  Intake 2780.82 ml  Output    750 ml  Net 2030.82 ml    Physical Exam: General: Awake, Oriented, No acute distress. HEENT: EOMI. Neck: Supple CV: S1 and S2, RRR, no murmur Lungs: Clear to ascultation bilaterally, no wheezing Abdomen: soft, min tenderness with palapation, Nondistended, +bowel sounds. Ext: Good pulses. Min LE edema.   Lab Results:  [facility] 11/14/11 0510 11/13/11 0615  NA 136 134*  K 3.8 4.4  CL 101 98  CO2 25 23  GLUCOSE 151* 212*  BUN 14 20  CREATININE 1.01 1.02  CALCIUM 9.5 9.7  MG -- 1.9  PHOS -- 3.3    Basename 11/14/11 0510 11/12/11 2253  AST 87* 164*  ALT 62* 63*  ALKPHOS 54 57  BILITOT 0.4 0.5  PROT 7.1 7.4  ALBUMIN 3.7 4.2    Basename 11/14/11 0510 11/13/11 0615  LIPASE >3000* 495*  AMYLASE -- --    Basename 11/14/11 0510 11/13/11 0615  WBC 12.6* 10.0  NEUTROABS -- --  HGB 12.6 12.0  HCT 37.7 35.9*  MCV 89.3 89.3  PLT 237 234    Basename 11/12/11 2253  CKTOTAL 41  CKMB 2.0  CKMBINDEX --  TROPONINI <0.30          Micro Results: No results found for this or any previous visit (from the past 240 hour(s)).  Studies/Results: Dg Chest 2 View  11/12/2011  *RADIOLOGY REPORT*  Clinical Data: Abdominal pain, hypertension  CHEST - 2 VIEW  Comparison: 07/30/2008  Findings:Improved aeration in the lower  lobes.  Lungs clear.  Heart size normal.  Atheromatous aortic arch.  No effusion.  Vascular clips in the upper abdomen.  Regional bones unremarkable.  IMPRESSION: .1.  No acute disease  Original Report Authenticated By: Osa Craver, M.D.   Mr 3d Recon At Scanner  11/13/2011  *RADIOLOGY REPORT*  Clinical Data:  Epigastric pain.  Nausea and vomiting. Pancreatitis.  Previous cholecystectomy.  MRI ABDOMEN WITHOUT AND WITH CONTRAST (MRCP)  Technique:  Multiplanar multisequence MR imaging of the abdomen was performed without and with contrast, including heavily T2-weighted images of the biliary and pancreatic ducts.  Three-dimensional MR images were rendered by post processing of the original MR data.  Contrast: 12mL MULTIHANCE GADOBENATE DIMEGLUMINE 529 MG/ML IV SOLN  Comparison:  Noncontrast CT on 07/24/2008  Findings:  Gallbladder is surgically absent.  Mild diffuse biliary ductal dilatation is seen with common bile duct measuring 12 mm. A low T2 signal focus is seen at the ampulla which has a convex contour with fluid in the distal common bile duct. This measures 7 mm and is suspicious for impacted distal common bile duct stone. No other intraductal calculi are seen there is no  evidence of biliary stricture.  No evidence of pancreatic ductal dilatation or pancreas divisum.  The pancreas is normal in appearance, and there is no evidence of pancreatic mass or peripancreatic inflammatory changes.  The liver, spleen, and adrenal glands are normal in appearance.  A simple right upper pole renal cyst is seen measuring 3.8 cm, however there is no evidence of renal mass or hydronephrosis.  No other abdominal soft tissue masses or lymphadenopathy identified.  No evidence of inflammatory process or abnormal fluid collections.  IMPRESSION:  1.  Mild diffuse biliary ductal dilatation with common bile duct measuring 12 mm in diameter. 2.  Probable 7 mm distal common bile duct stone impacted at the ampulla.  Consider  ERCP for further evaluation. 2.  No evidence of pancreatitis, pancreatic mass, or other acute findings.  Original Report Authenticated By: Danae Orleans, M.D.   Dg Ercp With Sphincterotomy  11/13/2011  *RADIOLOGY REPORT*  Clinical Data: Cholelithiasis  ERCP  Comparison:  Abdominal MRI 11/13/2011.  Technique:  Multiple spot images obtained with the fluoroscopic device and submitted for interpretation post-procedure.  ERCP was performed by Dr.  Elnoria Howard.  Findings: Fluoroscopic spot images demonstrate cannulation of the common bile duct.  The common bile duct is slightly dilated but no filling defects to suggest common bile duct stones.  Subsequent sphincterotomy and balloon passage are noted.  IMPRESSION: Sphincterotomy and balloon passage.  No definite common bile duct stones.  These images were submitted for radiologic interpretation only. Please see the procedural report for the amount of contrast and the fluoroscopy time utilized.  Original Report Authenticated By: P. Loralie Champagne, M.D.   Dg Abd Portable 2v  11/13/2011  *RADIOLOGY REPORT*  Clinical Data: Rule out perforation.  Status post ERCP.  PORTABLE ABDOMEN - 2 VIEW  Comparison: 11/13/2011 MRI and CT 07/24/2008  Findings: Supine and left lateral decubitus views of the abdomen show residual contrast in the biliary tree and proximal small bowel loops.  Surgical clips overlie the right upper quadrant.  There is no evidence for free intraperitoneal air.  Degenerative changes are identified in the spine.  IMPRESSION:  1.  Contrast following procedure. 2.  No evidence for free air. 3. The findings were discussed with Dr. Elnoria Howard on 11/13/2011 at 5:55 p.m.  Original Report Authenticated By: Patterson Hammersmith, M.D.   Mr Abd W/wo Cm/mrcp  11/13/2011  *RADIOLOGY REPORT*  Clinical Data:  Epigastric pain.  Nausea and vomiting. Pancreatitis.  Previous cholecystectomy.  MRI ABDOMEN WITHOUT AND WITH CONTRAST (MRCP)  Technique:  Multiplanar multisequence MR imaging of the  abdomen was performed without and with contrast, including heavily T2-weighted images of the biliary and pancreatic ducts.  Three-dimensional MR images were rendered by post processing of the original MR data.  Contrast: 12mL MULTIHANCE GADOBENATE DIMEGLUMINE 529 MG/ML IV SOLN  Comparison:  Noncontrast CT on 07/24/2008  Findings:  Gallbladder is surgically absent.  Mild diffuse biliary ductal dilatation is seen with common bile duct measuring 12 mm. A low T2 signal focus is seen at the ampulla which has a convex contour with fluid in the distal common bile duct. This measures 7 mm and is suspicious for impacted distal common bile duct stone. No other intraductal calculi are seen there is no evidence of biliary stricture.  No evidence of pancreatic ductal dilatation or pancreas divisum.  The pancreas is normal in appearance, and there is no evidence of pancreatic mass or peripancreatic inflammatory changes.  The liver, spleen, and adrenal glands are normal in  appearance.  A simple right upper pole renal cyst is seen measuring 3.8 cm, however there is no evidence of renal mass or hydronephrosis.  No other abdominal soft tissue masses or lymphadenopathy identified.  No evidence of inflammatory process or abnormal fluid collections.  IMPRESSION:  1.  Mild diffuse biliary ductal dilatation with common bile duct measuring 12 mm in diameter. 2.  Probable 7 mm distal common bile duct stone impacted at the ampulla.  Consider ERCP for further evaluation. 2.  No evidence of pancreatitis, pancreatic mass, or other acute findings.  Original Report Authenticated By: Danae Orleans, M.D.    Medications: I have reviewed the patient's current medications. Scheduled Meds:    . sodium chloride   Intravenous Once  . antiseptic oral rinse  15 mL Mouth Rinse q12n4p  . chlorhexidine  15 mL Mouth Rinse BID  . ciprofloxacin  400 mg Intravenous Q12H  . enoxaparin (LOVENOX) injection  40 mg Subcutaneous Q24H  . fenofibrate  160 mg  Oral Daily  . gadobenate dimeglumine  12 mL Intravenous Once  . insulin aspart  0-9 Units Subcutaneous TID WC  . latanoprost  1 drop Both Eyes QHS  . lisinopril  5 mg Oral Daily  . methylphenidate  5 mg Oral BID WC  . mulitivitamin with minerals  1 tablet Oral Daily  . ondansetron      . ondansetron      . pantoprazole  40 mg Oral Q1200  . simvastatin  40 mg Oral QPM  . DISCONTD: ciprofloxacin  400 mg Intravenous Once  . DISCONTD: insulin aspart  0-9 Units Subcutaneous TID WC   Continuous Infusions:    . sodium chloride 75 mL/hr at 11/14/11 0834  . DISCONTD: dextrose 5 % and 0.9 % NaCl with KCl 20 mEq/L 125 mL/hr at 11/13/11 0511   PRN Meds:.ALPRAZolam, HYDROmorphone, hyoscyamine, ondansetron (ZOFRAN) IV, ondansetron, DISCONTD: butamben-tetracaine-benzocaine, DISCONTD: diphenhydrAMINE, DISCONTD: fentaNYL, DISCONTD: glucagon, DISCONTD: HYDROmorphone, DISCONTD: midazolam, DISCONTD: Omnipaque 350 mg/mL (50 mL) in 0.9% normal saline (50 mL)  Assessment/Plan:   Pancreatitis- diet advance as tolerated, IVF, s/p ERCP, trend lipase  UTI (lower urinary tract infection)- cipro- does not appear culture was done before antibiotics given day #2  DM (diabetes mellitus)- SSI, D/C D5- BS have been high- restart amaryl once eating consistantly  HTN (hypertension)- stable  Hypercholesteremia  Glaucoma (increased eye pressure)- continue eye drops Hyponatremia- resolved    LOS: 2 days  [name] Richner, DO 11/14/2011, 10:37 AM

## 2011-11-14 NOTE — Progress Notes (Signed)
PT Cancellation Note  Evaluation cancelled today due to medical issues with patient which prohibited therapy. Attempted to see pt at 1500 with pt requesting pain meds prior to activity due to abd pain 7/10.  RN gave medicine and on return at 1610, pt reported no decr in pain and again requested to defer mobility.  RN made aware.  Cinde Ebert 11/14/2011, 4:31 PM Pager [phone]

## 2011-11-15 ENCOUNTER — Inpatient Hospital Stay (HOSPITAL_COMMUNITY): Payer: Medicare Other

## 2011-11-15 LAB — CBC
Hemoglobin: 12.3 g/dL (ref 12.0–15.0)
MCHC: 33.2 g/dL (ref 30.0–36.0)
RDW: 13.1 % (ref 11.5–15.5)
WBC: 16.3 10*3/uL — ABNORMAL HIGH (ref 4.0–10.5)

## 2011-11-15 LAB — COMPREHENSIVE METABOLIC PANEL
Albumin: 3.1 g/dL — ABNORMAL LOW (ref 3.5–5.2)
Alkaline Phosphatase: 49 U/L (ref 39–117)
BUN: 13 mg/dL (ref 6–23)
Creatinine, Ser: 1.12 mg/dL — ABNORMAL HIGH (ref 0.50–1.10)
GFR calc Af Amer: 53 mL/min — ABNORMAL LOW (ref >90)
Glucose, Bld: 170 mg/dL — ABNORMAL HIGH (ref 70–99)
Potassium: 4.5 mEq/L (ref 3.5–5.1)
Total Bilirubin: 0.6 mg/dL (ref 0.3–1.2)
Total Protein: 6.8 g/dL (ref 6.0–8.3)

## 2011-11-15 LAB — GLUCOSE, CAPILLARY
Glucose-Capillary: 221 mg/dL — ABNORMAL HIGH (ref 70–99)
Glucose-Capillary: 261 mg/dL — ABNORMAL HIGH (ref 70–99)

## 2011-11-15 LAB — LIPASE, BLOOD: Lipase: 2604 U/L — ABNORMAL HIGH (ref 11–59)

## 2011-11-15 MED ORDER — SODIUM CHLORIDE 0.9 % IV BOLUS (SEPSIS)
500.0000 mL | Freq: Once | INTRAVENOUS | Status: AC
  Administered 2011-11-15: 500 mL via INTRAVENOUS

## 2011-11-15 NOTE — Progress Notes (Signed)
Covering for Dr. Elnoria Howard, [facility] Patient seen, examined,  and I agree with the above documentation, including the assessment and plan. Pt overall says she feels better than yesterday, but pain is still severe at times.  Responding to dilaudid.  + Flatus. Fever also overnight. Will repeat CT scan without contrast (Crt up slightly and we are giving NS 500 cc bolus and increasing IVF rate). Supportive care. Would not advance diet yet.

## 2011-11-15 NOTE — Progress Notes (Signed)
Subjective: Patient still having some pain at night. Fever last night as well.  Tolerating jello, no CP, no SOB  Objective: Vital signs in last 24 hours: Filed Vitals:   11/14/11 1759 11/14/11 2145 11/15/11 0234 11/15/11 0730  BP: 129/57 132/57 110/55 119/57  Pulse: 89 112 93 98  Temp: 99.5 F (37.5 C) 102.7 F (39.3 C) 98.7 F (37.1 C) 99.7 F (37.6 C)  TempSrc: Oral Oral  Oral  Resp: 18 20 18 18   Height:      Weight:    59.557 kg (131 lb 4.8 oz)  SpO2: 97% 94% 96% 95%   Weight change:   Intake/Output Summary (Last 24 hours) at 11/15/11 0844 Last data filed at 11/15/11 0500  Gross per 24 hour  Intake 2980.5 ml  Output    250 ml  Net 2730.5 ml    Physical Exam: General: Awake, Oriented, No acute distress. HEENT: EOMI. Neck: Supple CV: S1 and S2 Lungs: Clear to ascultation bilaterally, no wheezing Abdomen: Soft, min tender, Nondistended, +bowel sounds. Ext: Good pulses. Trace edema.   Lab Results:  Basename 11/15/11 0620 11/14/11 0510 11/13/11 0615  NA 137 136 --  K 4.5 3.8 --  CL 101 101 --  CO2 24 25 --  GLUCOSE 170* 151* --  BUN 13 14 --  CREATININE 1.12* 1.01 --  CALCIUM 9.2 9.5 --  MG -- -- 1.9  PHOS -- -- 3.3    Basename 11/15/11 0620 11/14/11 0510  AST 40* 87*  ALT 37* 62*  ALKPHOS 49 54  BILITOT 0.6 0.4  PROT 6.8 7.1  ALBUMIN 3.1* 3.7    Basename 11/15/11 0620 11/14/11 0510  LIPASE 2604* >3000*  AMYLASE -- --    Alvira Philips 11/15/11 0620 11/14/11 0510  WBC 16.3* 12.6*  NEUTROABS -- --  HGB 12.3 12.6  HCT 37.0 37.7  MCV 90.5 89.3  PLT 216 237    Basename 11/12/11 2253  CKTOTAL 41  CKMB 2.0  CKMBINDEX --  TROPONINI <0.30   No components found with this basename: POCBNP:3 No results found for this basename: DDIMER:2 in the last 72 hours No results found for this basename: HGBA1C:2 in the last 72 hours No results found for this basename: CHOL:2,HDL:2,LDLCALC:2,TRIG:2,CHOLHDL:2,LDLDIRECT:2 in the last 72 hours No results found for  this basename: TSH,T4TOTAL,FREET3,T3FREE,THYROIDAB in the last 72 hours No results found for this basename: VITAMINB12:2,FOLATE:2,FERRITIN:2,TIBC:2,IRON:2,RETICCTPCT:2 in the last 72 hours  Micro Results: No results found for this or any previous visit (from the past 240 hour(s)).  Studies/Results: Mr 3d Recon At Scanner  11/13/2011  *RADIOLOGY REPORT*  Clinical Data:  Epigastric pain.  Nausea and vomiting. Pancreatitis.  Previous cholecystectomy.  MRI ABDOMEN WITHOUT AND WITH CONTRAST (MRCP)  Technique:  Multiplanar multisequence MR imaging of the abdomen was performed without and with contrast, including heavily T2-weighted images of the biliary and pancreatic ducts.  Three-dimensional MR images were rendered by post processing of the original MR data.  Contrast: 12mL MULTIHANCE GADOBENATE DIMEGLUMINE 529 MG/ML IV SOLN  Comparison:  Noncontrast CT on 07/24/2008  Findings:  Gallbladder is surgically absent.  Mild diffuse biliary ductal dilatation is seen with common bile duct measuring 12 mm. A low T2 signal focus is seen at the ampulla which has a convex contour with fluid in the distal common bile duct. This measures 7 mm and is suspicious for impacted distal common bile duct stone. No other intraductal calculi are seen there is no evidence of biliary stricture.  No evidence of pancreatic ductal dilatation  or pancreas divisum.  The pancreas is normal in appearance, and there is no evidence of pancreatic mass or peripancreatic inflammatory changes.  The liver, spleen, and adrenal glands are normal in appearance.  A simple right upper pole renal cyst is seen measuring 3.8 cm, however there is no evidence of renal mass or hydronephrosis.  No other abdominal soft tissue masses or lymphadenopathy identified.  No evidence of inflammatory process or abnormal fluid collections.  IMPRESSION:  1.  Mild diffuse biliary ductal dilatation with common bile duct measuring 12 mm in diameter. 2.  Probable 7 mm distal  common bile duct stone impacted at the ampulla.  Consider ERCP for further evaluation. 2.  No evidence of pancreatitis, pancreatic mass, or other acute findings.  Original Report Authenticated By: Danae Orleans, M.D.   Dg Ercp With Sphincterotomy  11/13/2011  *RADIOLOGY REPORT*  Clinical Data: Cholelithiasis  ERCP  Comparison:  Abdominal MRI 11/13/2011.  Technique:  Multiple spot images obtained with the fluoroscopic device and submitted for interpretation post-procedure.  ERCP was performed by Dr.  Elnoria Howard.  Findings: Fluoroscopic spot images demonstrate cannulation of the common bile duct.  The common bile duct is slightly dilated but no filling defects to suggest common bile duct stones.  Subsequent sphincterotomy and balloon passage are noted.  IMPRESSION: Sphincterotomy and balloon passage.  No definite common bile duct stones.  These images were submitted for radiologic interpretation only. Please see the procedural report for the amount of contrast and the fluoroscopy time utilized.  Original Report Authenticated By: P. Loralie Champagne, M.D.   Dg Abd Portable 2v  11/13/2011  *RADIOLOGY REPORT*  Clinical Data: Rule out perforation.  Status post ERCP.  PORTABLE ABDOMEN - 2 VIEW  Comparison: 11/13/2011 MRI and CT 07/24/2008  Findings: Supine and left lateral decubitus views of the abdomen show residual contrast in the biliary tree and proximal small bowel loops.  Surgical clips overlie the right upper quadrant.  There is no evidence for free intraperitoneal air.  Degenerative changes are identified in the spine.  IMPRESSION:  1.  Contrast following procedure. 2.  No evidence for free air. 3. The findings were discussed with Dr. Elnoria Howard on 11/13/2011 at 5:55 p.m.  Original Report Authenticated By: Patterson Hammersmith, M.D.   Mr Abd W/wo Cm/mrcp  11/13/2011  *RADIOLOGY REPORT*  Clinical Data:  Epigastric pain.  Nausea and vomiting. Pancreatitis.  Previous cholecystectomy.  MRI ABDOMEN WITHOUT AND WITH CONTRAST  (MRCP)  Technique:  Multiplanar multisequence MR imaging of the abdomen was performed without and with contrast, including heavily T2-weighted images of the biliary and pancreatic ducts.  Three-dimensional MR images were rendered by post processing of the original MR data.  Contrast: 12mL MULTIHANCE GADOBENATE DIMEGLUMINE 529 MG/ML IV SOLN  Comparison:  Noncontrast CT on 07/24/2008  Findings:  Gallbladder is surgically absent.  Mild diffuse biliary ductal dilatation is seen with common bile duct measuring 12 mm. A low T2 signal focus is seen at the ampulla which has a convex contour with fluid in the distal common bile duct. This measures 7 mm and is suspicious for impacted distal common bile duct stone. No other intraductal calculi are seen there is no evidence of biliary stricture.  No evidence of pancreatic ductal dilatation or pancreas divisum.  The pancreas is normal in appearance, and there is no evidence of pancreatic mass or peripancreatic inflammatory changes.  The liver, spleen, and adrenal glands are normal in appearance.  A simple right upper pole renal cyst is seen  measuring 3.8 cm, however there is no evidence of renal mass or hydronephrosis.  No other abdominal soft tissue masses or lymphadenopathy identified.  No evidence of inflammatory process or abnormal fluid collections.  IMPRESSION:  1.  Mild diffuse biliary ductal dilatation with common bile duct measuring 12 mm in diameter. 2.  Probable 7 mm distal common bile duct stone impacted at the ampulla.  Consider ERCP for further evaluation. 2.  No evidence of pancreatitis, pancreatic mass, or other acute findings.  Original Report Authenticated By: Danae Orleans, M.D.    Medications: I have reviewed the patient's current medications. Scheduled Meds:   . antiseptic oral rinse  15 mL Mouth Rinse q12n4p  . chlorhexidine  15 mL Mouth Rinse BID  . ciprofloxacin  400 mg Intravenous Q12H  . enoxaparin (LOVENOX) injection  40 mg Subcutaneous Q24H    . fenofibrate  160 mg Oral Daily  . insulin aspart  0-9 Units Subcutaneous TID WC  . latanoprost  1 drop Both Eyes QHS  . lisinopril  5 mg Oral Daily  . methylphenidate  5 mg Oral BID WC  . mulitivitamin with minerals  1 tablet Oral Daily  . pantoprazole  40 mg Oral Q1200  . simvastatin  40 mg Oral QPM   Continuous Infusions:   . sodium chloride 75 mL/hr at 11/14/11 2001   PRN Meds:.acetaminophen, ALPRAZolam, HYDROmorphone, hyoscyamine, ondansetron (ZOFRAN) IV, ondansetron, DISCONTD: ALPRAZolam, DISCONTD: HYDROmorphone  Assessment/Plan:  Pancreatitis- clears for now, IVF, s/p ERCP, lipase trending down nicely UTI (lower urinary tract infection)- cipro- does not appear culture was done before antibiotics given day #3, fever may need to broaden abx (+fever)- check blood cultures DM (diabetes mellitus)- SSI, D/C D5- BS have been high- restart amaryl once eating consistantly  HTN (hypertension)- stable  Hypercholesteremia  Glaucoma (increased eye pressure)- continue eye drops  Hyponatremia- resolved AKI- IVF increase, watch UOP Generalized weakness- PT    LOS: 3 days  Francois Elk, DO 11/15/2011, 8:44 AM

## 2011-11-15 NOTE — Progress Notes (Signed)
[facility] Progress Note  SUBJECTIVE: feels about same as yesterday. Still has abdominal pain plus her abdomen feels"sore". It hurts to move around today  OBJECTIVE:  Vital signs in last 24 hours: Temp:  [98.4 F (36.9 C)-102.7 F (39.3 C)] 99.7 F (37.6 C) (02/10 0730) Pulse Rate:  [80-112] 98  (02/10 0730) Resp:  [18-20] 18  (02/10 0730) BP: (110-132)/(54-57) 119/57 mmHg (02/10 0730) SpO2:  [94 %-97 %] 95 % (02/10 0730) Weight:  [59.557 kg (131 lb 4.8 oz)] 59.557 kg (131 lb 4.8 oz) (02/10 0730) Last BM Date: 11/12/11 General:    Pleasant, white female in NAD Heart:  Slightly tachy ar 110.  Abdomen:  Soft, non distended, a few bowel sounds. Moderate, diffuse upper abdominal tenderness. No peritoneal signs.  Neurologic:  Alert and oriented,  grossly normal neurologically. Psych:  Cooperative. Normal mood and affect.   Lab Results:  Basename 11/15/11 0620 11/14/11 0510 11/13/11 0615  WBC 16.3* 12.6* 10.0  HGB 12.3 12.6 12.0  HCT 37.0 37.7 35.9*  PLT 216 237 234   BMET  Basename 11/15/11 0620 11/14/11 0510 11/13/11 0615  NA 137 136 134*  K 4.5 3.8 4.4  CL 101 101 98  CO2 24 25 23   GLUCOSE 170* 151* 212*  BUN 13 14 20   CREATININE 1.12* 1.01 1.02  CALCIUM 9.2 9.5 9.7   LFT  Basename 11/15/11 0620 11/12/11 2253  PROT 6.8 --  ALBUMIN 3.1* --  AST 40* --  ALT 37* --  ALKPHOS 49 --  BILITOT 0.6 --  BILIDIR -- 0.3  IBILI -- 0.2*    Studies/Results: Mr 3d Recon At Scanner  11/13/2011  *RADIOLOGY REPORT*  Clinical Data:  Epigastric pain.  Nausea and vomiting. Pancreatitis.  Previous cholecystectomy.  MRI ABDOMEN WITHOUT AND WITH CONTRAST (MRCP)  Technique:  Multiplanar multisequence MR imaging of the abdomen was performed without and with contrast, including heavily T2-weighted images of the biliary and pancreatic ducts.  Three-dimensional MR images were rendered by post processing of the original MR data.  Contrast: 12mL MULTIHANCE GADOBENATE DIMEGLUMINE  529 MG/ML IV SOLN  Comparison:  Noncontrast CT on 07/24/2008  Findings:  Gallbladder is surgically absent.  Mild diffuse biliary ductal dilatation is seen with common bile duct measuring 12 mm. A low T2 signal focus is seen at the ampulla which has a convex contour with fluid in the distal common bile duct. This measures 7 mm and is suspicious for impacted distal common bile duct stone. No other intraductal calculi are seen there is no evidence of biliary stricture.  No evidence of pancreatic ductal dilatation or pancreas divisum.  The pancreas is normal in appearance, and there is no evidence of pancreatic mass or peripancreatic inflammatory changes.  The liver, spleen, and adrenal glands are normal in appearance.  A simple right upper pole renal cyst is seen measuring 3.8 cm, however there is no evidence of renal mass or hydronephrosis.  No other abdominal soft tissue masses or lymphadenopathy identified.  No evidence of inflammatory process or abnormal fluid collections.  IMPRESSION:  1.  Mild diffuse biliary ductal dilatation with common bile duct measuring 12 mm in diameter. 2.  Probable 7 mm distal common bile duct stone impacted at the ampulla.  Consider ERCP for further evaluation. 2.  No evidence of pancreatitis, pancreatic mass, or other acute findings.  Original Report Authenticated By: Danae Orleans, M.D.   Dg Ercp With Sphincterotomy  11/13/2011  *RADIOLOGY REPORT*  Clinical Data: Cholelithiasis  ERCP  Comparison:  Abdominal MRI 11/13/2011.  Technique:  Multiple spot images obtained with the fluoroscopic device and submitted for interpretation post-procedure.  ERCP was performed by Dr.  Elnoria Howard.  Findings: Fluoroscopic spot images demonstrate cannulation of the common bile duct.  The common bile duct is slightly dilated but no filling defects to suggest common bile duct stones.  Subsequent sphincterotomy and balloon passage are noted.  IMPRESSION: Sphincterotomy and balloon passage.  No definite common  bile duct stones.  These images were submitted for radiologic interpretation only. Please see the procedural report for the amount of contrast and the fluoroscopy time utilized.  Original Report Authenticated By: P. Loralie Champagne, M.D.   Dg Abd Portable 2v  11/13/2011  *RADIOLOGY REPORT*  Clinical Data: Rule out perforation.  Status post ERCP.  PORTABLE ABDOMEN - 2 VIEW  Comparison: 11/13/2011 MRI and CT 07/24/2008  Findings: Supine and left lateral decubitus views of the abdomen show residual contrast in the biliary tree and proximal small bowel loops.  Surgical clips overlie the right upper quadrant.  There is no evidence for free intraperitoneal air.  Degenerative changes are identified in the spine.  IMPRESSION:  1.  Contrast following procedure. 2.  No evidence for free air. 3. The findings were discussed with Dr. Elnoria Howard on 11/13/2011 at 5:55 p.m.  Original Report Authenticated By: Patterson Hammersmith, M.D.   Mr Abd W/wo Cm/mrcp  11/13/2011  *RADIOLOGY REPORT*  Clinical Data:  Epigastric pain.  Nausea and vomiting. Pancreatitis.  Previous cholecystectomy.  MRI ABDOMEN WITHOUT AND WITH CONTRAST (MRCP)  Technique:  Multiplanar multisequence MR imaging of the abdomen was performed without and with contrast, including heavily T2-weighted images of the biliary and pancreatic ducts.  Three-dimensional MR images were rendered by post processing of the original MR data.  Contrast: 12mL MULTIHANCE GADOBENATE DIMEGLUMINE 529 MG/ML IV SOLN  Comparison:  Noncontrast CT on 07/24/2008  Findings:  Gallbladder is surgically absent.  Mild diffuse biliary ductal dilatation is seen with common bile duct measuring 12 mm. A low T2 signal focus is seen at the ampulla which has a convex contour with fluid in the distal common bile duct. This measures 7 mm and is suspicious for impacted distal common bile duct stone. No other intraductal calculi are seen there is no evidence of biliary stricture.  No evidence of pancreatic ductal  dilatation or pancreas divisum.  The pancreas is normal in appearance, and there is no evidence of pancreatic mass or peripancreatic inflammatory changes.  The liver, spleen, and adrenal glands are normal in appearance.  A simple right upper pole renal cyst is seen measuring 3.8 cm, however there is no evidence of renal mass or hydronephrosis.  No other abdominal soft tissue masses or lymphadenopathy identified.  No evidence of inflammatory process or abnormal fluid collections.  IMPRESSION:  1.  Mild diffuse biliary ductal dilatation with common bile duct measuring 12 mm in diameter. 2.  Probable 7 mm distal common bile duct stone impacted at the ampulla.  Consider ERCP for further evaluation. 2.  No evidence of pancreatitis, pancreatic mass, or other acute findings.  Original Report Authenticated By: Danae Orleans, M.D.   ASSESSMENT / PLAN:  Gallstone pancreatitis, s/p ERCP Thursday.  No definite stones seen. Post-procedure there was a concern for retroperitoneal perforation but abdominal films okay. Post-procedure her lipase increased to >3000. Suspect post-ERCP pancreatitis.She continues to have pain but Dilaudid helps. No peritoneal signs on abdominal exam (just had pain meds) but it is concerning that her WBC  rose overnight from 12 to 16 and she had temp of 102 last night. Additionally, her creatinine rose slightly overnight.  IVF going at /hr.  Will give her a bolus of IVF and increase rate to 117ml/hr for now. Monitoring closely. She may need a CTscan.    LOS: 3 days   Willette Cluster  11/15/2011, 10:05 AM

## 2011-11-15 NOTE — Progress Notes (Signed)
PT Cancellation Note  Treatment cancelled today due to patient receiving procedure or test and patient's refusal to participate.  Attempted to see patient x 2 today.  First attempt, patient on way for CT.  Second attempt, patient politely refuses, stating too much pain to move.  Will return tomorrow for PT evaluation.  Vena Austria [phone] 11/15/2011, 6:26 PM

## 2011-11-16 ENCOUNTER — Encounter (HOSPITAL_COMMUNITY): Payer: Self-pay | Admitting: Gastroenterology

## 2011-11-16 DIAGNOSIS — D72829 Elevated white blood cell count, unspecified: Secondary | ICD-10-CM | POA: Diagnosis not present

## 2011-11-16 DIAGNOSIS — R509 Fever, unspecified: Secondary | ICD-10-CM | POA: Diagnosis not present

## 2011-11-16 LAB — GLUCOSE, CAPILLARY: Glucose-Capillary: 149 mg/dL — ABNORMAL HIGH (ref 70–99)

## 2011-11-16 LAB — CBC
HCT: 31.1 % — ABNORMAL LOW (ref 36.0–46.0)
MCV: 89.1 fL (ref 78.0–100.0)
Platelets: 193 10*3/uL (ref 150–400)
RBC: 3.49 MIL/uL — ABNORMAL LOW (ref 3.87–5.11)
WBC: 18.2 10*3/uL — ABNORMAL HIGH (ref 4.0–10.5)

## 2011-11-16 LAB — COMPREHENSIVE METABOLIC PANEL
ALT: 22 U/L (ref 0–35)
Albumin: 2.3 g/dL — ABNORMAL LOW (ref 3.5–5.2)
BUN: 13 mg/dL (ref 6–23)
Calcium: 8.1 mg/dL — ABNORMAL LOW (ref 8.4–10.5)
GFR calc Af Amer: 64 mL/min — ABNORMAL LOW (ref >90)
Glucose, Bld: 153 mg/dL — ABNORMAL HIGH (ref 70–99)
Total Protein: 5.6 g/dL — ABNORMAL LOW (ref 6.0–8.3)

## 2011-11-16 LAB — LIPASE, BLOOD: Lipase: 203 U/L — ABNORMAL HIGH (ref 11–59)

## 2011-11-16 MED ORDER — SODIUM CHLORIDE 0.9 % IJ SOLN: 9.0000 mL | INTRAMUSCULAR | Status: DC | PRN

## 2011-11-16 MED ORDER — NALOXONE HCL 0.4 MG/ML IJ SOLN: 0.4000 mg | INTRAMUSCULAR | Status: DC | PRN

## 2011-11-16 MED ORDER — AZTREONAM 1 G IJ SOLR
1.0000 g | Freq: Two times a day (BID) | INTRAMUSCULAR | Status: DC
  Administered 2011-11-16: 1 g via INTRAVENOUS
  Filled 2011-11-16 (×2): qty 1

## 2011-11-16 MED ORDER — POTASSIUM CHLORIDE 10 MEQ/100ML IV SOLN
INTRAVENOUS | Status: AC
  Administered 2011-11-16: 10 meq via INTRAVENOUS
  Filled 2011-11-16: qty 100

## 2011-11-16 MED ORDER — POTASSIUM CHLORIDE 10 MEQ/100ML IV SOLN
10.0000 meq | INTRAVENOUS | Status: AC
  Administered 2011-11-16 (×2): 10 meq via INTRAVENOUS
  Filled 2011-11-16: qty 100

## 2011-11-16 MED ORDER — HYDROMORPHONE 0.3 MG/ML IV SOLN
INTRAVENOUS | Status: DC
  Administered 2011-11-16: 0.3 mg via INTRAVENOUS
  Administered 2011-11-16: 1.5 mg via INTRAVENOUS
  Administered 2011-11-16: 0.3 mg via INTRAVENOUS
  Administered 2011-11-16: 2.7 mg via INTRAVENOUS

## 2011-11-16 MED ORDER — HYDROMORPHONE 0.3 MG/ML IV SOLN
INTRAVENOUS | Status: AC
  Administered 2011-11-16: 0.3 mg via INTRAVENOUS
  Filled 2011-11-16: qty 25

## 2011-11-16 MED ORDER — DIPHENHYDRAMINE HCL 12.5 MG/5ML PO ELIX
12.5000 mg | ORAL_SOLUTION | Freq: Four times a day (QID) | ORAL | Status: DC | PRN
  Filled 2011-11-16: qty 5

## 2011-11-16 MED ORDER — SODIUM CHLORIDE 0.9 % IV SOLN
250.0000 mg | Freq: Three times a day (TID) | INTRAVENOUS | Status: DC
  Administered 2011-11-16: 250 mg via INTRAVENOUS
  Filled 2011-11-16 (×3): qty 250

## 2011-11-16 MED ORDER — HYDROMORPHONE 0.3 MG/ML IV SOLN: INTRAVENOUS | Status: DC

## 2011-11-16 MED ORDER — DIPHENHYDRAMINE HCL 50 MG/ML IJ SOLN: 12.5000 mg | Freq: Four times a day (QID) | INTRAMUSCULAR | Status: DC | PRN

## 2011-11-16 NOTE — Progress Notes (Addendum)
. Subjective: Patient c/o occasional belly pain, fever/sweats at night as well.  Objective: Vital signs in last 24 hours: Filed Vitals:   11/15/11 1745 11/15/11 2132 11/16/11 0210 11/16/11 0545  BP: 132/65 107/52 123/62 114/62  Pulse: 76 100 115 116  Temp: 100.5 F (38.1 C) 99.3 F (37.4 C) 101.1 F (38.4 C) 99.9 F (37.7 C)  TempSrc: Oral Oral Oral Oral  Resp: 18 18 20 18   Height:      Weight:    59.5 kg (131 lb 2.8 oz)  SpO2: 94% 94% 97% 96%   Weight change:   Intake/Output Summary (Last 24 hours) at 11/16/11 0744 Last data filed at 11/16/11 0543  Gross per 24 hour  Intake   3513 ml  Output    400 ml  Net   3113 ml    Physical Exam: General: Awake, Oriented, No acute distress. HEENT: EOMI. Neck: Supple,no JVD CV: S1 and S2, regular to tachy Lungs: Clear to ascultation bilaterally, no wheezing Abdomen: Soft, RUQ tenderness, Nondistended, +bowel sounds. Ext: Good pulses. Trace edema.   Lab Results:  [facility] 11/15/11 0620 11/14/11 0510  NA 137 136  K 4.5 3.8  CL 101 101  CO2 24 25  GLUCOSE 170* 151*  BUN 13 14  CREATININE 1.12* 1.01  CALCIUM 9.2 9.5  MG -- --  PHOS -- --    Basename 11/15/11 0620 11/14/11 0510  AST 40* 87*  ALT 37* 62*  ALKPHOS 49 54  BILITOT 0.6 0.4  PROT 6.8 7.1  ALBUMIN 3.1* 3.7    Basename 11/15/11 0620 11/14/11 0510  LIPASE 2604* >3000*  AMYLASE -- --    Alvira Philips 11/16/11 0640 11/15/11 0620  WBC 18.2* 16.3*  NEUTROABS -- --  HGB 10.4* 12.3  HCT 31.1* 37.0  MCV 89.1 90.5  PLT 193 216      Micro Results: No results found for this or any previous visit (from the past 240 hour(s)).  Studies/Results: Ct Abdomen Wo Contrast  11/15/2011  *RADIOLOGY REPORT*  Clinical Data:  Post ERCP pancreatitis, fever, concern for retroperitoneal perforation  CT ABDOMEN WITHOUT CONTRAST  Technique:  Multidetector CT imaging of the abdomen was performed following the standard protocol without IV contrast.  Comparison:  MRI abdomen  dated 11/13/2011  Findings:  Mild atelectasis at the lung bases.  Unenhanced liver, spleen, and adrenal glands within normal limits.  Peripancreatic inflammatory changes, compatible with acute pancreatitis. Inflammatory changes predominately involve the pancreatic head/uncinate process and extend into the right retroperitoneum/perinephric space.  No drainable fluid collection or abscess.  No free air.  Status post cholecystectomy.  Common duct measures 11 mm.  No intrahepatic ductal dilatation.  Right kidney is notable for a 3.4 x 3.5 cm upper pole cyst.  Left kidney is unremarkable.  No hydronephrosis.  Visualized bowel is unremarkable.  Atherosclerotic calcifications of the abdominal aorta and branch vessels.  No suspicious abdominal lymphadenopathy.  Mild degenerative changes of the visualized thoracolumbar spine.  IMPRESSION: Peripancreatic inflammatory changes, compatible with acute pancreatitis.  No drainable fluid collection or abscess.  No free air.  Original Report Authenticated By: Charline Bills, M.D.    Medications: I have reviewed the patient's current medications. Scheduled Meds:    . antiseptic oral rinse  15 mL Mouth Rinse q12n4p  . aztreonam  1 g Intravenous Q12H  . chlorhexidine  15 mL Mouth Rinse BID  . enoxaparin (LOVENOX) injection  40 mg Subcutaneous Q24H  . fenofibrate  160 mg Oral Daily  . insulin aspart  0-9 Units Subcutaneous TID WC  . latanoprost  1 drop Both Eyes QHS  . lisinopril  5 mg Oral Daily  . methylphenidate  5 mg Oral BID WC  . mulitivitamin with minerals  1 tablet Oral Daily  . pantoprazole  40 mg Oral Q1200  . simvastatin  40 mg Oral QPM  . sodium chloride  500 mL Intravenous Once  . DISCONTD: ciprofloxacin  400 mg Intravenous Q12H   Continuous Infusions:    . sodium chloride 150 mL/hr at 11/16/11 0408   PRN Meds:.acetaminophen, ALPRAZolam, HYDROmorphone, hyoscyamine, ondansetron (ZOFRAN) IV, ondansetron  Assessment/Plan:  Fever/leukocytosis-  blood cultures ordered yesterday await growth, s/p ERCP with pancreatitis, will expand abx to primaxin (still spiking temps with cipro), pt had what appears to be UTI on admission but no cx done  Pancreatitis- clears for now, IVF, s/p ERCP for ?blocked duct, lipase trending down nicely  UTI (lower urinary tract infection)- does not appear culture was done before antibiotics given, broaded abx to from cipro to primaxin  DM (diabetes mellitus)- SSI,  restart amaryl once eating consistantly   HTN (hypertension)- stable   Hypercholesteremia   Glaucoma (increased eye pressure)- continue eye drops   Hyponatremia- resolved  AKI- IVF increase, watch UOP- await CMP today  Generalized weakness- PT  Addendum: spoke with Dr. Elnoria Howard who said fevers/WBCs are reactive and to continue to monitor unless she becomes septic.   Will D/C abx and watch   LOS: 4 days  Sonia Bromell, DO 11/16/2011, 7:44 AM

## 2011-11-16 NOTE — Progress Notes (Signed)
ANTIBIOTIC CONSULT NOTE - INITIAL  Pharmacy Consult for Primaxin Indication: pancreatitis  Allergies  Allergen Reactions  . Lactose Intolerance (Gi)     Nausea and vomiting, severe stomach pain  . Penicillins Hives and Itching  . Sulfa Antibiotics Hives and Itching    Patient Measurements: Height: 5' (152.4 cm) Weight: 131 lb 2.8 oz (59.5 kg) IBW/kg (Calculated) : 45.5   Vital Signs: Temp: 99.9 F (37.7 C) (02/11 0545) Temp src: Oral (02/11 0545) BP: 114/62 mmHg (02/11 0545) Pulse Rate: 116  (02/11 0545) Intake/Output from previous day: 02/10 0701 - 02/11 0700 In: 3513 [P.O.:580; I.V.:2933] Out: 400 [Urine:400] Intake/Output from this shift:    Labs:  Basename 11/16/11 0640 11/15/11 0620 11/14/11 0510  WBC 18.2* 16.3* 12.6*  HGB 10.4* 12.3 12.6  PLT 193 216 237  LABCREA -- -- --  CREATININE -- 1.12* 1.01   Estimated Creatinine Clearance: 32.9 ml/min (by C-G formula based on Cr of 1.12).  Medical History: Past Medical History  Diagnosis Date  . Pancreatitis   . Hypertension   . Diabetes mellitus   . Glaucoma   . High cholesterol   . GERD (gastroesophageal reflux disease)   . H/O hiatal hernia     Medications:  Scheduled:    . antiseptic oral rinse  15 mL Mouth Rinse q12n4p  . aztreonam  1 g Intravenous Q12H  . chlorhexidine  15 mL Mouth Rinse BID  . enoxaparin (LOVENOX) injection  40 mg Subcutaneous Q24H  . fenofibrate  160 mg Oral Daily  . insulin aspart  0-9 Units Subcutaneous TID WC  . latanoprost  1 drop Both Eyes QHS  . lisinopril  5 mg Oral Daily  . methylphenidate  5 mg Oral BID WC  . mulitivitamin with minerals  1 tablet Oral Daily  . pantoprazole  40 mg Oral Q1200  . simvastatin  40 mg Oral QPM  . sodium chloride  500 mL Intravenous Once  . DISCONTD: ciprofloxacin  400 mg Intravenous Q12H   Assessment: 78 yo female with pancreatitis, persistent fevers/leukocytosis for empiric antibiotics  Plan:  Primaxin 250 mg IV q8h  Eddie Candle 11/16/2011,7:59 AM

## 2011-11-16 NOTE — Progress Notes (Signed)
[facility] hospitalist notified that pt was having confusion Pt was reoriented to place she thought that she was home and did not know why she was in hospital she was on the phone with husband. I spoke with husband he stated that  This was new for the pt but that she reported having some nightmares. O2 stats 94 % on room air vs stable. New orders  Will continue to monitor. Ilean Skill LPN

## 2011-11-16 NOTE — Progress Notes (Signed)
Patient ID: [name] [name], female   DOB: [birth year], 78 y.o.   MRN: [id number] Subjective: No change with her abdominal pain.  She reports needing pain medication 2-3 times yesterday.  The pain appears to be intermittent.  Objective: Vital signs in last 24 hours: Temp:  [98.7 F (37.1 C)-101.1 F (38.4 C)] 99.9 F (37.7 C) (02/11 0545) Pulse Rate:  [76-118] 116  (02/11 0545) Resp:  [18-20] 18  (02/11 0545) BP: (107-132)/(52-65) 114/62 mmHg (02/11 0545) SpO2:  [94 %-97 %] 96 % (02/11 0545) Weight:  [59.5 kg (131 lb 2.8 oz)] 59.5 kg (131 lb 2.8 oz) (02/11 0545) Last BM Date: 11/15/11  Intake/Output from previous day: 02/10 0701 - 02/11 0700 In: 3513 [P.O.:580; I.V.:2933] Out: 400 [Urine:400] Intake/Output this shift:    General appearance: alert and mild distress GI: soft, non-tender; bowel sounds normal; no masses,  no organomegaly  Lab Results:  [facility] 11/16/11 0640 11/15/11 0620 11/14/11 0510  WBC 18.2* 16.3* 12.6*  HGB 10.4* 12.3 12.6  HCT 31.1* 37.0 37.7  PLT 193 216 237   BMET  Basename 11/15/11 0620 11/14/11 0510  NA 137 136  K 4.5 3.8  CL 101 101  CO2 24 25  GLUCOSE 170* 151*  BUN 13 14  CREATININE 1.12* 1.01  CALCIUM 9.2 9.5   LFT  Basename 11/15/11 0620  PROT 6.8  ALBUMIN 3.1*  AST 40*  ALT 37*  ALKPHOS 49  BILITOT 0.6  BILIDIR --  IBILI --   PT/INR No results found for this basename: LABPROT:2,INR:2 in the last 72 hours Hepatitis Panel No results found for this basename: HEPBSAG,HCVAB,HEPAIGM,HEPBIGM in the last 72 hours C-Diff No results found for this basename: CDIFFTOX:3 in the last 72 hours Fecal Lactopherrin No results found for this basename: FECLLACTOFRN in the last 72 hours  Studies/Results: Ct Abdomen Wo Contrast  11/15/2011  *RADIOLOGY REPORT*  Clinical Data:  Post ERCP pancreatitis, fever, concern for retroperitoneal perforation  CT ABDOMEN WITHOUT CONTRAST  Technique:  Multidetector CT imaging of the abdomen was performed  following the standard protocol without IV contrast.  Comparison:  MRI abdomen dated 11/13/2011  Findings:  Mild atelectasis at the lung bases.  Unenhanced liver, spleen, and adrenal glands within normal limits.  Peripancreatic inflammatory changes, compatible with acute pancreatitis. Inflammatory changes predominately involve the pancreatic head/uncinate process and extend into the right retroperitoneum/perinephric space.  No drainable fluid collection or abscess.  No free air.  Status post cholecystectomy.  Common duct measures 11 mm.  No intrahepatic ductal dilatation.  Right kidney is notable for a 3.4 x 3.5 cm upper pole cyst.  Left kidney is unremarkable.  No hydronephrosis.  Visualized bowel is unremarkable.  Atherosclerotic calcifications of the abdominal aorta and branch vessels.  No suspicious abdominal lymphadenopathy.  Mild degenerative changes of the visualized thoracolumbar spine.  IMPRESSION: Peripancreatic inflammatory changes, compatible with acute pancreatitis.  No drainable fluid collection or abscess.  No free air.  Original Report Authenticated By: Charline Bills, M.D.    Medications:  Scheduled:   . antiseptic oral rinse  15 mL Mouth Rinse q12n4p  . aztreonam  1 g Intravenous Q12H  . chlorhexidine  15 mL Mouth Rinse BID  . enoxaparin (LOVENOX) injection  40 mg Subcutaneous Q24H  . fenofibrate  160 mg Oral Daily  . imipenem-cilastatin  250 mg Intravenous Q8H  . insulin aspart  0-9 Units Subcutaneous TID WC  . latanoprost  1 drop Both Eyes QHS  . lisinopril  5 mg Oral Daily  .  methylphenidate  5 mg Oral BID WC  . mulitivitamin with minerals  1 tablet Oral Daily  . pantoprazole  40 mg Oral Q1200  . simvastatin  40 mg Oral QPM  . sodium chloride  500 mL Intravenous Once  . DISCONTD: ciprofloxacin  400 mg Intravenous Q12H   Continuous:   . sodium chloride 150 mL/hr at 11/16/11 0408    Assessment/Plan: 1) Post ERCP pancreatitis. 2) Nausea/Vomiting resolved.   The  patient reports no change in the post-ERCP pancreatitis pain.  Her WBC has increased, but the CT scan only showed the pancreatitis.    Plan: 1) Start Dilaudid PCA. 2) Stop Cipro.  Her increase in WBC can be explained by the pancreatitis, but she will need to be monitored for fever. 3) Maintain NPO.  LOS: 4 days   Ianmichael Amescua D 11/16/2011, 8:03 AM

## 2011-11-16 NOTE — Evaluation (Signed)
Physical Therapy Evaluation Patient Details Name: [name] [name] MRN: [id number] DOB: Jun 10, 1930 Today's Date: 11/16/2011  Problem List:  Patient Active Problem List  Diagnoses  . Pancreatitis  . UTI (lower urinary tract infection)  . DM (diabetes mellitus)  . HTN (hypertension)  . Hypercholesteremia  . Glaucoma (increased eye pressure)  . Hyponatremia  . Fever  . Leukocytosis    Past Medical History:  Past Medical History  Diagnosis Date  . Pancreatitis   . Hypertension   . Diabetes mellitus   . Glaucoma   . High cholesterol   . GERD (gastroesophageal reflux disease)   . H/O hiatal hernia    Past Surgical History:  Past Surgical History  Procedure Date  . Appendectomy   . Abdominal hysterectomy   . Cholecystectomy   . Ercp 11/13/2011    Procedure: ENDOSCOPIC RETROGRADE CHOLANGIOPANCREATOGRAPHY (ERCP);  Surgeon: Theda Belfast, MD;  Location: [facility] ENDOSCOPY;  Service: Endoscopy;  Laterality: N/A;    PT Assessment/Plan/Recommendation PT Assessment Clinical Impression Statement: Pt adm with pancreatitis.  Pt with better pain control today.  Needs skilled PT to maximize I and safety so pt can return home with family.  Expect pt will be able to return home with HHPT and family. PT Recommendation/Assessment: Patient will need skilled PT in the acute care venue PT Problem List: Decreased strength;Decreased activity tolerance;Decreased mobility;Pain;Decreased balance;Decreased knowledge of use of DME PT Therapy Diagnosis : Difficulty walking;Generalized weakness;Acute pain PT Plan PT Frequency: Min 3X/week PT Treatment/Interventions: DME instruction;Gait training;Functional mobility training;Therapeutic activities;Therapeutic exercise;Balance training;Patient/family education PT Recommendation Follow Up Recommendations: Home health PT Equipment Recommended: None recommended by PT PT Goals  Acute Rehab PT Goals PT Goal Formulation: With patient Time For Goal Achievement:  7 days Pt will go Supine/Side to Sit: with supervision PT Goal: Supine/Side to Sit - Progress: Goal set today Pt will go Sit to Supine/Side: with supervision PT Goal: Sit to Supine/Side - Progress: Goal set today Pt will go Sit to Stand: with modified independence PT Goal: Sit to Stand - Progress: Goal set today Pt will go Stand to Sit: with modified independence PT Goal: Stand to Sit - Progress: Goal set today Pt will Ambulate: 51 - 150 feet;with supervision;with least restrictive assistive device PT Goal: Ambulate - Progress: Goal set today  PT Evaluation Precautions/Restrictions  Precautions Precautions: Fall Prior Functioning  Home Living Lives With: Spouse Receives Help From: Family Type of Home: House Home Layout: One level Home Access: Ramped entrance Bathroom Shower/Tub: Tub/shower unit;Curtain Bathroom Toilet: Handicapped height Bathroom Accessibility: Yes How Accessible: Accessible via walker Home Adaptive Equipment: Shower chair without back;Raised toilet seat with rails;Walker - four wheeled;Straight cane Additional Comments: Took care of her friend x 6 years (friend passed away mid-January) Prior Function Level of Independence: Independent with basic ADLs;Independent with homemaking with ambulation;Independent with gait Driving: Yes Vocation: Retired Producer, television/film/video: Awake/alert Overall Cognitive Status: Appears within functional limits for tasks assessed Orientation Level: Oriented X4 Sensation/Coordination   Extremity Assessment RLE Strength RLE Overall Strength Comments: grossly 4/5 LLE Strength LLE Overall Strength Comments: grossly 4/5 Mobility (including Balance) Bed Mobility Sit to Sidelying Left: 4: Min assist;HOB flat Sit to Sidelying Left Details (indicate cue type and reason): assist to bring feet up Transfers Sit to Stand: 4: Min assist;From chair/3-in-1;With upper extremity assist;With armrests Sit to Stand Details  (indicate cue type and reason): Needs cues for hand placement. Stand to Sit: 4: Min assist;With upper extremity assist;To bed Ambulation/Gait Ambulation/Gait Assistance: 4: Min assist Ambulation/Gait Assistance Details (  indicate cue type and reason): Cues to stay closer to walker especially when turning. Ambulation Distance (Feet): 80 Feet Assistive device: Rolling walker Gait Pattern: Decreased step length - right;Decreased step length - left;Trunk flexed Gait velocity: slow cadence  Static Standing Balance Static Standing - Balance Support: Bilateral upper extremity supported (with walker) Static Standing - Level of Assistance: 5: Stand by assistance Exercise    End of Session PT - End of Session Activity Tolerance: Patient limited by fatigue Patient left: in bed;with call bell in reach Nurse Communication: Mobility status for ambulation  [name] [name] 11/16/2011, 2:04 PM  [employer] PT [phone]

## 2011-11-16 NOTE — Progress Notes (Signed)
Utilization review complete 

## 2011-11-17 LAB — COMPREHENSIVE METABOLIC PANEL
ALT: 16 U/L (ref 0–35)
AST: 16 U/L (ref 0–37)
Alkaline Phosphatase: 45 U/L (ref 39–117)
CO2: 19 mEq/L (ref 19–32)
Calcium: 8.3 mg/dL — ABNORMAL LOW (ref 8.4–10.5)
GFR calc non Af Amer: 59 mL/min — ABNORMAL LOW (ref >90)
Potassium: 3.2 mEq/L — ABNORMAL LOW (ref 3.5–5.1)
Sodium: 137 mEq/L (ref 135–145)

## 2011-11-17 LAB — CBC
HCT: 29.4 % — ABNORMAL LOW (ref 36.0–46.0)
Hemoglobin: 9.9 g/dL — ABNORMAL LOW (ref 12.0–15.0)
MCH: 29.8 pg (ref 26.0–34.0)
MCHC: 33.7 g/dL (ref 30.0–36.0)
RDW: 13.4 % (ref 11.5–15.5)

## 2011-11-17 LAB — GLUCOSE, CAPILLARY: Glucose-Capillary: 241 mg/dL — ABNORMAL HIGH (ref 70–99)

## 2011-11-17 LAB — LIPASE, BLOOD: Lipase: 101 U/L — ABNORMAL HIGH (ref 11–59)

## 2011-11-17 MED ORDER — HYDROMORPHONE HCL PF 1 MG/ML IJ SOLN
1.0000 mg | INTRAMUSCULAR | Status: DC | PRN
  Administered 2011-11-17 – 2011-11-18 (×7): 1 mg via INTRAVENOUS
  Filled 2011-11-17 (×9): qty 1

## 2011-11-17 MED ORDER — HYDROMORPHONE HCL PF 1 MG/ML IJ SOLN
0.5000 mg | Freq: Once | INTRAMUSCULAR | Status: AC
  Administered 2011-11-17: 11:00:00 via INTRAVENOUS

## 2011-11-17 MED ORDER — POTASSIUM CHLORIDE 10 MEQ/100ML IV SOLN
10.0000 meq | INTRAVENOUS | Status: AC
  Administered 2011-11-17 (×3): 10 meq via INTRAVENOUS
  Filled 2011-11-17 (×2): qty 100

## 2011-11-17 MED ORDER — HYDROMORPHONE HCL PF 1 MG/ML IJ SOLN
0.5000 mg | INTRAMUSCULAR | Status: DC | PRN
  Administered 2011-11-17 (×3): 0.5 mg via INTRAVENOUS

## 2011-11-17 MED ORDER — POTASSIUM CHLORIDE 10 MEQ/100ML IV SOLN
INTRAVENOUS | Status: AC
  Administered 2011-11-17: 10 meq via INTRAVENOUS
  Filled 2011-11-17: qty 100

## 2011-11-17 NOTE — Progress Notes (Signed)
. Subjective: Episode of confusion last PM, push the dilaudid button when it turned green instead of PRN.  Small amount of abdominal pain currently.  Tolerated a boost this AM  Objective: Vital signs in last 24 hours: Filed Vitals:   11/17/11 0200 11/17/11 0335 11/17/11 0540 11/17/11 1021  BP: 126/53  120/54 127/47  Pulse: 101  92 94  Temp: 99.7 F (37.6 C) 100.1 F (37.8 C) 98.6 F (37 C) 98.2 F (36.8 C)  TempSrc:      Resp: 19  18 16   Height:      Weight:   58.06 kg (128 lb)   SpO2: 94%  97% 97%   Weight change: -1.497 kg (-3 lb 4.8 oz)  Intake/Output Summary (Last 24 hours) at 11/17/11 1023 Last data filed at 11/17/11 0700  Gross per 24 hour  Intake   3783 ml  Output    750 ml  Net   3033 ml    Physical Exam: General: Awake, Oriented, No acute distress. HEENT: EOMI. Neck: Supple,no JVD CV: S1 and S2, regular to tachy Lungs: Clear to ascultation bilaterally, no wheezing Abdomen: Soft, no tenderness, Nondistended, +bowel sounds. Ext: Good pulses. Trace edema.   Lab Results:  [facility] 11/17/11 0514 11/16/11 0640  NA 137 135  K 3.2* 3.4*  CL 108 106  CO2 19 17*  GLUCOSE 170* 153*  BUN 13 13  CREATININE 0.90 0.96  CALCIUM 8.3* 8.1*  MG -- --  PHOS -- --    Basename 11/17/11 0514 11/16/11 0640  AST 16 24  ALT 16 22  ALKPHOS 45 41  BILITOT 0.5 0.6  PROT 5.6* 5.6*  ALBUMIN 2.4* 2.3*    Basename 11/17/11 0514 11/16/11 0640  LIPASE 101* 203*  AMYLASE -- --    Basename 11/17/11 0514 11/16/11 0640  WBC 15.4* 18.2*  NEUTROABS -- --  HGB 9.9* 10.4*  HCT 29.4* 31.1*  MCV 88.6 89.1  PLT 211 193      Micro Results: Recent Results (from the past 240 hour(s))  CULTURE, BLOOD (ROUTINE X 2)     Status: Normal (Preliminary result)   Collection Time   11/15/11  8:50 AM      Component Value Range Status Comment   Specimen Description BLOOD LEFT ARM   Final    Special Requests BOTTLES DRAWN AEROBIC ONLY 10CC   Final    Culture  Setup Time  [id number]   Final    Culture     Final    Value:        BLOOD CULTURE RECEIVED NO GROWTH TO DATE CULTURE WILL BE HELD FOR 5 DAYS BEFORE ISSUING A FINAL NEGATIVE REPORT   Report Status PENDING   Incomplete   CULTURE, BLOOD (ROUTINE X 2)     Status: Normal (Preliminary result)   Collection Time   11/15/11  9:00 AM      Component Value Range Status Comment   Specimen Description BLOOD LEFT HAND   Final    Special Requests BOTTLES DRAWN AEROBIC ONLY 10CC   Final    Culture  Setup Time [id number]   Final    Culture     Final    Value:        BLOOD CULTURE RECEIVED NO GROWTH TO DATE CULTURE WILL BE HELD FOR 5 DAYS BEFORE ISSUING A FINAL NEGATIVE REPORT   Report Status PENDING   Incomplete     Studies/Results: Ct Abdomen Wo Contrast  11/15/2011  *RADIOLOGY REPORT*  Clinical Data:  Post ERCP pancreatitis, fever, concern for retroperitoneal perforation  CT ABDOMEN WITHOUT CONTRAST  Technique:  Multidetector CT imaging of the abdomen was performed following the standard protocol without IV contrast.  Comparison:  MRI abdomen dated 11/13/2011  Findings:  Mild atelectasis at the lung bases.  Unenhanced liver, spleen, and adrenal glands within normal limits.  Peripancreatic inflammatory changes, compatible with acute pancreatitis. Inflammatory changes predominately involve the pancreatic head/uncinate process and extend into the right retroperitoneum/perinephric space.  No drainable fluid collection or abscess.  No free air.  Status post cholecystectomy.  Common duct measures 11 mm.  No intrahepatic ductal dilatation.  Right kidney is notable for a 3.4 x 3.5 cm upper pole cyst.  Left kidney is unremarkable.  No hydronephrosis.  Visualized bowel is unremarkable.  Atherosclerotic calcifications of the abdominal aorta and branch vessels.  No suspicious abdominal lymphadenopathy.  Mild degenerative changes of the visualized thoracolumbar spine.  IMPRESSION: Peripancreatic inflammatory changes, compatible with  acute pancreatitis.  No drainable fluid collection or abscess.  No free air.  Original Report Authenticated By: Charline Bills, M.D.    Medications: I have reviewed the patient's current medications. Scheduled Meds:    . antiseptic oral rinse  15 mL Mouth Rinse q12n4p  . chlorhexidine  15 mL Mouth Rinse BID  . enoxaparin (LOVENOX) injection  40 mg Subcutaneous Q24H  . fenofibrate  160 mg Oral Daily  . insulin aspart  0-9 Units Subcutaneous TID WC  . latanoprost  1 drop Both Eyes QHS  . lisinopril  5 mg Oral Daily  . methylphenidate  5 mg Oral BID WC  . mulitivitamin with minerals  1 tablet Oral Daily  . pantoprazole  40 mg Oral Q1200  . potassium chloride  10 mEq Intravenous Q1 Hr x 2  . simvastatin  40 mg Oral QPM  . DISCONTD: HYDROmorphone PCA 0.3 mg/mL   Intravenous Q4H  . DISCONTD: HYDROmorphone PCA 0.3 mg/mL   Intravenous Q4H   Continuous Infusions:    . sodium chloride 150 mL/hr at 11/17/11 0908   PRN Meds:.acetaminophen, ALPRAZolam, diphenhydrAMINE, diphenhydrAMINE, HYDROmorphone, HYDROmorphone (DILAUDID) injection, hyoscyamine, naloxone, ondansetron (ZOFRAN) IV, ondansetron, sodium chloride  Assessment/Plan:  Fever/leukocytosis- blood cultures ordered yesterday await growth, s/p ERCP with pancreatitis, most likely related to pancreatitis  Pancreatitis- clears for now, IVF, s/p ERCP for ?blocked duct, lipase trending down nicely  UTI (lower urinary tract infection)- does not appear culture was done before antibiotics given, cipro x 3 days  DM (diabetes mellitus)- SSI,  restart amaryl once eating consistantly   HTN (hypertension)- stable   Hypercholesteremia   Glaucoma (increased eye pressure)- continue eye drops   Hyponatremia- resolved  AKI- resolved  Generalized weakness- PT  Hope for diet tomm and D/C after that, patient may need H/H    LOS: 5 days  Leno Mathes, DO 11/17/2011, 10:23 AM

## 2011-11-17 NOTE — Progress Notes (Signed)
Physical Therapy Treatment Patient Details Name: [name] [name] MRN: [id number] DOB: 05/08/1930 Today's Date: 11/17/2011  PT Assessment/Plan  PT - Assessment/Plan PT Plan: Discharge plan remains appropriate PT Goals  Acute Rehab PT Goals PT Goal: Supine/Side to Sit - Progress: Met PT Goal: Sit to Supine/Side - Progress: Progressing toward goal PT Goal: Sit to Stand - Progress: Progressing toward goal PT Goal: Stand to Sit - Progress: Progressing toward goal PT Goal: Ambulate - Progress: Met  PT Treatment Precautions/Restrictions  Precautions Precautions: Fall Required Braces or Orthoses: No Restrictions Weight Bearing Restrictions: No Mobility (including Balance) Bed Mobility Bed Mobility: Yes Supine to Sit: 6: Modified independent (Device/Increase time);With rails Sitting - Scoot to Edge of Bed: 6: Modified independent (Device/Increase time);Other (comment) (with use of bilat UEs) Transfers Sit to Stand: 5: Supervision;From bed;From chair/3-in-1;With upper extremity assist Sit to Stand Details (indicate cue type and reason): focus on hand and RW placement to increase safety and functional independence Stand to Sit: 5: Supervision;To chair/3-in-1;To bed Stand to Sit Details: focus on hand and RW placement to increase safety and functional independence Ambulation/Gait Ambulation/Gait Assistance: 5: Supervision Ambulation/Gait Assistance Details (indicate cue type and reason): verbal cues for encouragement to increase gait distance, focus on RW position and posture during turns to increase safety and functioanl independence Ambulation Distance (Feet): 115 Feet and 50 feet with seated rest break between trials Assistive device: Rolling walker Gait Pattern: Step-through pattern (intermittently shuffled with fatigue) Gait velocity: decreased compared to baseline per husband report Stairs: No Wheelchair Mobility Wheelchair Mobility: No   End of Session PT - End of  Session Equipment Utilized During Treatment: Gait belt;Other (comment) (RW) Activity Tolerance: Patient limited by fatigue Patient left: in chair;with call bell in reach;with family/visitor present. Encouraged performance of ankle pumps and SAQ while sitting in chair for increased activity tolerance and strength. Patient verbalized and demonstrated understanding without PT assist. Nurse Communication: Mobility status for transfers;Mobility status for ambulation General Behavior During Session: [facility] for tasks performed Cognition: [facility] for tasks performed  Romeo Rabon 11/17/2011, 3:49 PM

## 2011-11-17 NOTE — Progress Notes (Signed)
Patient ID: [name] [name], female   DOB: [birth year], 78 y.o.   MRN: [id number] Subjective: The patient is well this AM.  She had an episode of confusion last evening.  Objective: Vital signs in last 24 hours: Temp:  [98.6 F (37 C)-100.1 F (37.8 C)] 98.6 F (37 C) (02/12 0540) Pulse Rate:  [92-109] 92  (02/12 0540) Resp:  [16-20] 18  (02/12 0540) BP: (111-132)/(50-61) 120/54 mmHg (02/12 0540) SpO2:  [93 %-100 %] 97 % (02/12 0540) Weight:  [58.06 kg (128 lb)] 58.06 kg (128 lb) (02/12 0540) Last BM Date: 11/16/11  Intake/Output from previous day: 02/11 0701 - 02/12 0700 In: 3783 [I.V.:3783] Out: 750 [Urine:750] Intake/Output this shift:    General appearance: alert, no distress and oriented x 3 GI: soft, non-tender; bowel sounds normal; no masses,  no organomegaly  Lab Results:  [facility] 11/17/11 0514 11/16/11 0640 11/15/11 0620  WBC 15.4* 18.2* 16.3*  HGB 9.9* 10.4* 12.3  HCT 29.4* 31.1* 37.0  PLT 211 193 216   BMET  Basename 11/17/11 0514 11/16/11 0640 11/15/11 0620  NA 137 135 137  K 3.2* 3.4* 4.5  CL 108 106 101  CO2 19 17* 24  GLUCOSE 170* 153* 170*  BUN 13 13 13   CREATININE 0.90 0.96 1.12*  CALCIUM 8.3* 8.1* 9.2   LFT  Basename 11/17/11 0514  PROT 5.6*  ALBUMIN 2.4*  AST 16  ALT 16  ALKPHOS 45  BILITOT 0.5  BILIDIR --  IBILI --   PT/INR No results found for this basename: LABPROT:2,INR:2 in the last 72 hours Hepatitis Panel No results found for this basename: HEPBSAG,HCVAB,HEPAIGM,HEPBIGM in the last 72 hours C-Diff No results found for this basename: CDIFFTOX:3 in the last 72 hours Fecal Lactopherrin No results found for this basename: FECLLACTOFRN in the last 72 hours  Studies/Results: Ct Abdomen Wo Contrast  11/15/2011  *RADIOLOGY REPORT*  Clinical Data:  Post ERCP pancreatitis, fever, concern for retroperitoneal perforation  CT ABDOMEN WITHOUT CONTRAST  Technique:  Multidetector CT imaging of the abdomen was performed following the  standard protocol without IV contrast.  Comparison:  MRI abdomen dated 11/13/2011  Findings:  Mild atelectasis at the lung bases.  Unenhanced liver, spleen, and adrenal glands within normal limits.  Peripancreatic inflammatory changes, compatible with acute pancreatitis. Inflammatory changes predominately involve the pancreatic head/uncinate process and extend into the right retroperitoneum/perinephric space.  No drainable fluid collection or abscess.  No free air.  Status post cholecystectomy.  Common duct measures 11 mm.  No intrahepatic ductal dilatation.  Right kidney is notable for a 3.4 x 3.5 cm upper pole cyst.  Left kidney is unremarkable.  No hydronephrosis.  Visualized bowel is unremarkable.  Atherosclerotic calcifications of the abdominal aorta and branch vessels.  No suspicious abdominal lymphadenopathy.  Mild degenerative changes of the visualized thoracolumbar spine.  IMPRESSION: Peripancreatic inflammatory changes, compatible with acute pancreatitis.  No drainable fluid collection or abscess.  No free air.  Original Report Authenticated By: Charline Bills, M.D.    Medications:  Scheduled:   . antiseptic oral rinse  15 mL Mouth Rinse q12n4p  . chlorhexidine  15 mL Mouth Rinse BID  . enoxaparin (LOVENOX) injection  40 mg Subcutaneous Q24H  . fenofibrate  160 mg Oral Daily  . insulin aspart  0-9 Units Subcutaneous TID WC  . latanoprost  1 drop Both Eyes QHS  . lisinopril  5 mg Oral Daily  . methylphenidate  5 mg Oral BID WC  . mulitivitamin with minerals  1  tablet Oral Daily  . pantoprazole  40 mg Oral Q1200  . potassium chloride  10 mEq Intravenous Q1 Hr x 2  . simvastatin  40 mg Oral QPM  . DISCONTD: aztreonam  1 g Intravenous Q12H  . DISCONTD: HYDROmorphone PCA 0.3 mg/mL   Intravenous Q4H  . DISCONTD: HYDROmorphone PCA 0.3 mg/mL   Intravenous Q4H  . DISCONTD: imipenem-cilastatin  250 mg Intravenous Q8H   Continuous:   . sodium chloride 150 mL/hr at 11/17/11 0700     Assessment/Plan: 1) Post ERCP pancreatitis. 2) Confusion last evening.   I think the patient used too much of the dilaudid.  She thought she was supposed to press the PCA every time the light turned green rather than PRN.  She was discontinued on the medication and now she is well.  Her pain is under control at this time and she is on PRN dilaudid.  Plan: 1) Continue with PRN dilaudid. 2) Continue NPO. 3) Anticipated starting diet tomorrow if all goes well and then D/C on Thursday.  LOS: 5 days   Va Broadwell D 11/17/2011, 8:12 AM

## 2011-11-18 LAB — GLUCOSE, CAPILLARY
Glucose-Capillary: 130 mg/dL — ABNORMAL HIGH (ref 70–99)
Glucose-Capillary: 160 mg/dL — ABNORMAL HIGH (ref 70–99)

## 2011-11-18 MED ORDER — HYDROMORPHONE HCL PF 1 MG/ML IJ SOLN
1.0000 mg | INTRAMUSCULAR | Status: AC
  Administered 2011-11-18: 1 mg via INTRAVENOUS

## 2011-11-18 MED ORDER — HYDROMORPHONE HCL PF 1 MG/ML IJ SOLN
0.5000 mg | Freq: Once | INTRAMUSCULAR | Status: AC
  Administered 2011-11-18: 0.5 mg via INTRAVENOUS

## 2011-11-18 MED ORDER — POTASSIUM CHLORIDE CRYS ER 20 MEQ PO TBCR
40.0000 meq | EXTENDED_RELEASE_TABLET | Freq: Two times a day (BID) | ORAL | Status: DC
  Administered 2011-11-18 – 2011-11-23 (×11): 40 meq via ORAL
  Filled 2011-11-18 (×13): qty 2

## 2011-11-18 MED ORDER — HYDROMORPHONE HCL PF 1 MG/ML IJ SOLN
1.0000 mg | INTRAMUSCULAR | Status: DC | PRN
  Administered 2011-11-19 – 2011-11-30 (×41): 1 mg via INTRAVENOUS
  Filled 2011-11-18 (×45): qty 1

## 2011-11-18 MED ORDER — POTASSIUM CHLORIDE 20 MEQ PO PACK
40.0000 meq | PACK | Freq: Two times a day (BID) | ORAL | Status: DC
  Filled 2011-11-18: qty 2

## 2011-11-18 NOTE — Progress Notes (Signed)
   CARE MANAGEMENT NOTE 11/18/2011  Patient:  [name] [name], [name] [name]   Account Number:  [id number]  Date Initiated:  11/13/2011  Documentation initiated by:  Donn Pierini  Subjective/Objective Assessment:   Pt admitted with pancreatitis to have ERCP done     Action/Plan:   PTA pt lived at home, was independent with ADLs  pt eval- recs hhpt   Anticipated DC Date:  11/20/2011   Anticipated DC Plan:  HOME/SELF CARE         Choice offered to / List presented to:             Status of service:  In process, will continue to follow Medicare Important Message given?   (If response is "NO", the following Medicare IM given date fields will be blank) Date Medicare IM given:   Date Additional Medicare IM given:    Discharge Disposition:    Per UR Regulation:    Comments:  11/18/11 16:48 [name] RN, BSN [phone] patient is s/p ERCP, pt is slow to progress, physical therapy recs hhpt.  11/13/11- 1700- Donn Pierini RN, BSN- [phone] Pt to have an ERCP, CM to follow for d/c needs

## 2011-11-18 NOTE — Progress Notes (Signed)
Pt was given 1mg  Dilauded at 1350 and states she is still in severe pain.  [name] also states that pt looks "swollen".  MD notified.  Will continue to monitor.  [name] [name]

## 2011-11-18 NOTE — Progress Notes (Signed)
Patient ID: Markasia Carrol, female   DOB: 1930-07-12, 78 y.o.   MRN: [id number] Subjective: No acute events.  Feeling better.  The intensity of the abdominal pain is decreasing.  Objective: Vital signs in last 24 hours: Temp:  [98.1 F (36.7 C)-99.9 F (37.7 C)] 98.1 F (36.7 C) (02/13 0614) Pulse Rate:  [83-98] 85  (02/13 0614) Resp:  [16-20] 20  (02/13 0614) BP: (110-127)/(47-61) 116/50 mmHg (02/13 0614) SpO2:  [95 %-99 %] 99 % (02/13 0614) Weight:  [57.6 kg (126 lb 15.8 oz)] 57.6 kg (126 lb 15.8 oz) (02/13 0614) Last BM Date: 11/16/11  Intake/Output from previous day: 02/12 0701 - 02/13 0700 In: 3540 [P.O.:390; I.V.:2850; IV Piggyback:300] Out: 452 [Urine:451; Stool:1] Intake/Output this shift:    General appearance: alert and no distress GI: soft, non-tender; bowel sounds normal; no masses,  no organomegaly  Lab Results:  [facility] 11/17/11 0514 11/16/11 0640  WBC 15.4* 18.2*  HGB 9.9* 10.4*  HCT 29.4* 31.1*  PLT 211 193   BMET  Basename 11/17/11 0514 11/16/11 0640  NA 137 135  K 3.2* 3.4*  CL 108 106  CO2 19 17*  GLUCOSE 170* 153*  BUN 13 13  CREATININE 0.90 0.96  CALCIUM 8.3* 8.1*   LFT  Basename 11/17/11 0514  PROT 5.6*  ALBUMIN 2.4*  AST 16  ALT 16  ALKPHOS 45  BILITOT 0.5  BILIDIR --  IBILI --   PT/INR No results found for this basename: LABPROT:2,INR:2 in the last 72 hours Hepatitis Panel No results found for this basename: HEPBSAG,HCVAB,HEPAIGM,HEPBIGM in the last 72 hours C-Diff No results found for this basename: CDIFFTOX:3 in the last 72 hours Fecal Lactopherrin No results found for this basename: FECLLACTOFRN in the last 72 hours  Studies/Results: No results found.  Medications:  Scheduled:   . antiseptic oral rinse  15 mL Mouth Rinse q12n4p  . chlorhexidine  15 mL Mouth Rinse BID  . enoxaparin (LOVENOX) injection  40 mg Subcutaneous Q24H  . fenofibrate  160 mg Oral Daily  .  HYDROmorphone (DILAUDID) injection  0.5 mg  Intravenous Once  . insulin aspart  0-9 Units Subcutaneous TID WC  . latanoprost  1 drop Both Eyes QHS  . lisinopril  5 mg Oral Daily  . methylphenidate  5 mg Oral BID WC  . mulitivitamin with minerals  1 tablet Oral Daily  . pantoprazole  40 mg Oral Q1200  . potassium chloride  10 mEq Intravenous Q1 Hr x 3  . simvastatin  40 mg Oral QPM   Continuous:   . sodium chloride 150 mL/hr at 11/18/11 0700    Assessment/Plan: 1) Post ERCP pancreatitis. 2) Choledocholithiasis s/p ERCP   It appears she tolerated clear liquids yesterday and a can of Boost.  I will advance her to a full liquid diet.  Plan: 1) Advance diet. 2) Dilaudid PRN.  LOS: 6 days   Tiffney Haughton D 11/18/2011, 7:48 AM

## 2011-11-19 ENCOUNTER — Inpatient Hospital Stay (HOSPITAL_COMMUNITY): Payer: Medicare Other

## 2011-11-19 ENCOUNTER — Other Ambulatory Visit: Payer: Self-pay

## 2011-11-19 LAB — COMPREHENSIVE METABOLIC PANEL
Alkaline Phosphatase: 71 U/L (ref 39–117)
BUN: 13 mg/dL (ref 6–23)
Chloride: 108 mEq/L (ref 96–112)
GFR calc Af Amer: 90 mL/min — ABNORMAL LOW (ref >90)
GFR calc non Af Amer: 77 mL/min — ABNORMAL LOW (ref >90)
Glucose, Bld: 161 mg/dL — ABNORMAL HIGH (ref 70–99)
Potassium: 4.4 mEq/L (ref 3.5–5.1)
Total Bilirubin: 0.5 mg/dL (ref 0.3–1.2)
Total Protein: 6.6 g/dL (ref 6.0–8.3)

## 2011-11-19 LAB — CBC
Platelets: 350 10*3/uL (ref 150–400)
RDW: 13.8 % (ref 11.5–15.5)
WBC: 19.4 10*3/uL — ABNORMAL HIGH (ref 4.0–10.5)

## 2011-11-19 LAB — GLUCOSE, CAPILLARY
Glucose-Capillary: 144 mg/dL — ABNORMAL HIGH (ref 70–99)
Glucose-Capillary: 143 mg/dL — ABNORMAL HIGH (ref 70–99)

## 2011-11-19 LAB — LIPASE, BLOOD: Lipase: 85 U/L — ABNORMAL HIGH (ref 11–59)

## 2011-11-19 LAB — CARDIAC PANEL(CRET KIN+CKTOT+MB+TROPI)
Relative Index: INVALID (ref 0.0–2.5)
Total CK: 40 U/L (ref 7–177)

## 2011-11-19 LAB — TSH: TSH: 3.5 u[IU]/mL (ref 0.350–4.500)

## 2011-11-19 MED ORDER — METOPROLOL TARTRATE 1 MG/ML IV SOLN
INTRAVENOUS | Status: AC
  Filled 2011-11-19: qty 5

## 2011-11-19 MED ORDER — METOPROLOL TARTRATE 1 MG/ML IV SOLN
5.0000 mg | Freq: Four times a day (QID) | INTRAVENOUS | Status: DC
  Administered 2011-11-19 – 2011-11-23 (×15): 5 mg via INTRAVENOUS
  Filled 2011-11-19 (×19): qty 5

## 2011-11-19 MED ORDER — DILTIAZEM HCL 100 MG IV SOLR
5.0000 mg/h | INTRAVENOUS | Status: DC
  Administered 2011-11-19: 10 mg/h via INTRAVENOUS
  Administered 2011-11-19 (×2): 15 mg/h via INTRAVENOUS
  Administered 2011-11-19: 10 mg/h via INTRAVENOUS
  Administered 2011-11-19: 5 mg/h via INTRAVENOUS
  Administered 2011-11-20: 15 mg/h via INTRAVENOUS
  Filled 2011-11-19 (×2): qty 100

## 2011-11-19 MED ORDER — METOPROLOL TARTRATE 1 MG/ML IV SOLN
5.0000 mg | Freq: Four times a day (QID) | INTRAVENOUS | Status: DC
  Administered 2011-11-19: 5 mg via INTRAVENOUS

## 2011-11-19 MED ORDER — IOHEXOL 300 MG/ML  SOLN
100.0000 mL | Freq: Once | INTRAMUSCULAR | Status: AC | PRN
  Administered 2011-11-19: 100 mL via INTRAVENOUS

## 2011-11-19 MED ORDER — IOHEXOL 300 MG/ML  SOLN
20.0000 mL | INTRAMUSCULAR | Status: AC
  Administered 2011-11-19 (×2): 20 mL via ORAL

## 2011-11-19 MED ORDER — SODIUM CHLORIDE 0.9 % IV SOLN
500.0000 mg | Freq: Three times a day (TID) | INTRAVENOUS | Status: DC
  Administered 2011-11-19 – 2011-11-23 (×12): 500 mg via INTRAVENOUS
  Filled 2011-11-19 (×18): qty 500

## 2011-11-19 NOTE — Progress Notes (Signed)
PT Cancellation Note  Treatment cancelled today due to medical issues with patient which prohibited therapy. NOted patient transferring to step down unit. Will recheck on patient as appropriate.   Thanks 11/19/2011 Fredrich Birks PTA [phone] pager [phone] office     Fredrich Birks 11/19/2011, 10:36 AM

## 2011-11-19 NOTE — Progress Notes (Signed)
  Echocardiogram 2D Echocardiogram has been performed.  Katia Hannen Nira Retort 11/19/2011, 12:43 PM

## 2011-11-19 NOTE — Progress Notes (Signed)
Utilization review completed.  

## 2011-11-19 NOTE — Progress Notes (Signed)
Patient experienced increasing abdominal pain overnight, irregularly tachy this morning. EKG = atrial fib w/ RVR. MD notified, order to transfer to stepdown. Rapid response present, monitoring Cardizem drip.

## 2011-11-19 NOTE — Progress Notes (Addendum)
Patient ID: [name] [name], female   DOB: 08/28/1930, 78 y.o.   MRN: [id number] Subjective: Increased abdominal pain yesterday and Tmax 101.  Objective: Vital signs in last 24 hours: Temp:  [98.6 F (37 C)-101.1 F (38.4 C)] 98.6 F (37 C) (02/14 0700) Pulse Rate:  [56-149] 105  (02/14 0700) Resp:  [16-24] 24  (02/14 0700) BP: (115-151)/(58-87) 137/77 mmHg (02/14 0700) SpO2:  [89 %-94 %] 94 % (02/14 0700) Weight:  [58 kg (127 lb 13.9 oz)] 58 kg (127 lb 13.9 oz) (02/14 0627) Last BM Date: 11/18/11  Intake/Output from previous day: 02/13 0701 - 02/14 0700 In: 420 [P.O.:420] Out: 1250 [Urine:1250] Intake/Output this shift:    General appearance: alert and mild distress Resp: clear to auscultation bilaterally Cardio: ? irregularly irregular GI: tender in the epigastrium and RUQ Extremities: extremities normal, atraumatic, no cyanosis or edema  Lab Results:  [facility] 11/17/11 0514  WBC 15.4*  HGB 9.9*  HCT 29.4*  PLT 211   BMET  Basename 11/17/11 0514  NA 137  K 3.2*  CL 108  CO2 19  GLUCOSE 170*  BUN 13  CREATININE 0.90  CALCIUM 8.3*   LFT  Basename 11/17/11 0514  PROT 5.6*  ALBUMIN 2.4*  AST 16  ALT 16  ALKPHOS 45  BILITOT 0.5  BILIDIR --  IBILI --   PT/INR No results found for this basename: LABPROT:2,INR:2 in the last 72 hours Hepatitis Panel No results found for this basename: HEPBSAG,HCVAB,HEPAIGM,HEPBIGM in the last 72 hours C-Diff No results found for this basename: CDIFFTOX:3 in the last 72 hours Fecal Lactopherrin No results found for this basename: FECLLACTOFRN in the last 72 hours  Studies/Results: No results found.  Medications:  Scheduled:   . antiseptic oral rinse  15 mL Mouth Rinse q12n4p  . chlorhexidine  15 mL Mouth Rinse BID  . enoxaparin (LOVENOX) injection  40 mg Subcutaneous Q24H  . fenofibrate  160 mg Oral Daily  .  HYDROmorphone (DILAUDID) injection  0.5 mg Intravenous Once  .  HYDROmorphone (DILAUDID) injection  1  mg Intravenous NOW  . imipenem-cilastatin  500 mg Intravenous Q8H  . insulin aspart  0-9 Units Subcutaneous TID WC  . latanoprost  1 drop Both Eyes QHS  . lisinopril  5 mg Oral Daily  . methylphenidate  5 mg Oral BID WC  . mulitivitamin with minerals  1 tablet Oral Daily  . pantoprazole  40 mg Oral Q1200  . potassium chloride  40 mEq Oral BID  . simvastatin  40 mg Oral QPM  . DISCONTD: potassium chloride  40 mEq Oral BID   Continuous:   . diltiazem (CARDIZEM) infusion    . DISCONTD: sodium chloride 150 mL/hr at 11/18/11 0945    Assessment/Plan: 1) Post-ERCP pancreatitis. 2) Fever and increased ABM pain. 3) ? afib   In light of the fever and the recent ERCP I will order a CT scan of the ABM with contrast to see if there is a new change.  I agree with Dr. Phillips Odor that a CXR is also required to check for other sources of infection.  Auscultating her cardiac tones it may be that she has an irregular rhythm.  I did not notice anything in the recent past and I will order an EKG.  Additionally, I asked the patient about her allergies to PCN as I am starting her on Primaxin.  She had a rash in her 30's when she was treated with PCN, but no other ill effects.  In  this situation I think it will be worthwhile to proceed with Primaxin.  Plan: 1) CT scan of the ABM with contrast. 2) Pain control. 3) EKG. 4) Primaxin. 5) I will follow closely.  LOS: 7 days   Marcellis Frampton D 11/19/2011, 8:27 AM   ADDENDUM: Nursing informed me that she does have afib with RVR and rapid response is here at this time to start a cardizem drip.

## 2011-11-19 NOTE — Progress Notes (Signed)
While on rounds alerted to patient with SDU transfer orders and need for Cardizem drip for [facility].  Pt alert, w/d, lungs clear - denies CP or SOB.  Dr. Phillips Odor present.  BP stable - 135/68 HR 175 RR 18 O2 sat 88% - placed on 3 liter n/c sats increased to 97%.  IV patent - 2nd IV started by VAST team.  0935 Cardizem drip - 100mg /100cc hung via left wrist PIV - site ok - 10 mg IV bolus given and drip maintained at 5 mg/hr.  1000:  133/61 HR 145 RR 22 O2sat 97% - pt remains asx without SOB or CP.  Cardizem 10 mg IV bolus given and drip increased to 10 mg/hr.  1030:  134/70 HR 150 RR 22 O2 sat 97%  Cardizem drip increased to 15 mg/hr - pt remains symptom free.  Transferred to 3300 without incident.  1100:  117/65  HR 74  RR 18.

## 2011-11-19 NOTE — Progress Notes (Signed)
Subjective: Pain slightly worse today, dilaudid was decreased. Tolerating a bland diet.   Objective: Vital signs in last 24 hours: Temp:  [98.6 F (37 C)-101.1 F (38.4 C)] 98.7 F (37.1 C) (02/14 0627) Pulse Rate:  [56-149] 109  (02/14 0627) Resp:  [16-20] 18  (02/14 0627) BP: (115-151)/(58-87) 127/78 mmHg (02/14 0627) SpO2:  [89 %-94 %] 94 % (02/14 0627) Weight:  [58 kg (127 lb 13.9 oz)] 58 kg (127 lb 13.9 oz) (02/14 0627) Weight change: 0.4 kg (14.1 oz) Last BM Date: 11/18/11  Intake/Output from previous day: 02/13 0701 - 02/14 0700 In: 420 [P.O.:420] Out: 1250 [Urine:1250]     Physical Exam: General: Pale,alert, awake, oriented x3, in no acute distress. HEENT: No bruits, no goiter. Heart: Regular rate and rhythm, without murmurs, rubs, gallops. Lungs: Clear to auscultation bilaterally. Abdomen: Soft, mild epigastric tenderness, nondistended, positive bowel sounds. Extremities: No clubbing cyanosis, NEW edema generalized trace -with positive pedal pulses. Neuro: Grossly intact, nonfocal.   Lab Results: Basic Metabolic Panel:  Basename 11/17/11 0514  NA 137  K 3.2*  CL 108  CO2 19  GLUCOSE 170*  BUN 13  CREATININE 0.90  CALCIUM 8.3*  MG --  PHOS --   Liver Function Tests:  [facility] 11/17/11 0514  AST 16  ALT 16  ALKPHOS 45  BILITOT 0.5  PROT 5.6*  ALBUMIN 2.4*    Basename 11/17/11 0514  LIPASE 101*  AMYLASE --   No results found for this basename: AMMONIA:2 in the last 72 hours CBC:  Basename 11/17/11 0514  WBC 15.4*  NEUTROABS --  HGB 9.9*  HCT 29.4*  MCV 88.6  PLT 211   Cardiac Enzymes: No results found for this basename: CKTOTAL:3,CKMB:3,CKMBINDEX:3,TROPONINI:3 in the last 72 hours BNP: No results found for this basename: PROBNP:3 in the last 72 hours D-Dimer: No results found for this basename: DDIMER:2 in the last 72 hours CBG:  Basename 11/18/11 2131 11/18/11 1830 11/18/11 1631 11/18/11 1157 11/18/11 0734 11/17/11 2133    GLUCAP 188* 142* 168* 160* 130* 125*   Recent Results (from the past 240 hour(s))  CULTURE, BLOOD (ROUTINE X 2)     Status: Normal (Preliminary result)   Collection Time   11/15/11  8:50 AM      Component Value Range Status Comment   Specimen Description BLOOD LEFT ARM   Final    Special Requests BOTTLES DRAWN AEROBIC ONLY 10CC   Final    Culture  Setup Time [id number]   Final    Culture     Final    Value:        BLOOD CULTURE RECEIVED NO GROWTH TO DATE CULTURE WILL BE HELD FOR 5 DAYS BEFORE ISSUING A FINAL NEGATIVE REPORT   Report Status PENDING   Incomplete   CULTURE, BLOOD (ROUTINE X 2)     Status: Normal (Preliminary result)   Collection Time   11/15/11  9:00 AM      Component Value Range Status Comment   Specimen Description BLOOD LEFT HAND   Final    Special Requests BOTTLES DRAWN AEROBIC ONLY 10CC   Final    Culture  Setup Time [id number]   Final    Culture     Final    Value:        BLOOD CULTURE RECEIVED NO GROWTH TO DATE CULTURE WILL BE HELD FOR 5 DAYS BEFORE ISSUING A FINAL NEGATIVE REPORT   Report Status PENDING   Incomplete     Studies/Results: No results  found.  Medications: Scheduled Meds:   . antiseptic oral rinse  15 mL Mouth Rinse q12n4p  . chlorhexidine  15 mL Mouth Rinse BID  . enoxaparin (LOVENOX) injection  40 mg Subcutaneous Q24H  . fenofibrate  160 mg Oral Daily  .  HYDROmorphone (DILAUDID) injection  0.5 mg Intravenous Once  .  HYDROmorphone (DILAUDID) injection  1 mg Intravenous NOW  . insulin aspart  0-9 Units Subcutaneous TID WC  . latanoprost  1 drop Both Eyes QHS  . lisinopril  5 mg Oral Daily  . methylphenidate  5 mg Oral BID WC  . mulitivitamin with minerals  1 tablet Oral Daily  . pantoprazole  40 mg Oral Q1200  . potassium chloride  40 mEq Oral BID  . simvastatin  40 mg Oral QPM  . DISCONTD: potassium chloride  40 mEq Oral BID   Continuous Infusions:   . DISCONTD: sodium chloride 150 mL/hr at 11/18/11 0945   PRN  Meds:.acetaminophen, ALPRAZolam, diphenhydrAMINE, diphenhydrAMINE, HYDROmorphone (DILAUDID) injection, hyoscyamine, ondansetron (ZOFRAN) IV, ondansetron, sodium chloride, DISCONTD:  HYDROmorphone (DILAUDID) injection  Assessment/Plan:  1. Pancreatitis, post ERCP for an impacted distal common bile duct stone Complicated course, pain ongoing somewhat worse today and should be getting better. Needing Dilaudid every 4 hours. We did advance her diet which may be contributing to this, but she may need a repeat CT to make sure inflammation is resolving. Will defer to GI for advancing this work up-appreciate Dr. Haywood Pao assistance.  2. UTI: received 3 days of cipro  3. Pain: pain not improving, may be anxiety component, she isn't getting any of her prn Alprazolam I will discuss with nursing.  4. Dm, stable SSI  5. Hyperlipidema, on a fibrate, ?no statin currently  6. HTN; BP well controlled  7. Hypokalemia; will replete IV runs.  8. New edema: she is taking PO, I will d/c IV fluid for now and re-check in AM.  9. Dispo: Still undergoing active medical management, will ask PT to mobilize OOB.   LOS: 7 days   [facility] Pager: [phone] 11/19/2011, 7:37 AM

## 2011-11-19 NOTE — Progress Notes (Signed)
Pt c/o 10/10 abd pain and has already received IV dilaudid and cannot have any more for 4 hours. Pt. Pulse rate is 142 with bp of 135/62, temp of 100.9. Pt also stated to NT that "something just doesn't feel right." Maren Reamer NP paged. Orders received for additional dose of 1 mg IV Dilaudid and prn order changed to every 2 hours if needed. Will carry out orders and continue to monitor.

## 2011-11-19 NOTE — Progress Notes (Addendum)
Subjective: Increased abdominal pain. "I just dont feel right". Temp 101 overnight and HR 146.  Objective: Vital signs in last 24 hours: Temp:  [98.1 F (36.7 C)-99.2 F (37.3 C)] 98.1 F (36.7 C) (02/15 0400) Pulse Rate:  [69-175] 80  (02/14 1930) Resp:  [12-24] 16  (02/14 1930) BP: (103-143)/(51-77) 119/51 mmHg (02/14 1930) SpO2:  [88 %-97 %] 94 % (02/14 1930) Weight:  [58.6 kg (129 lb 3 oz)] 58.6 kg (129 lb 3 oz) (02/14 1030) Weight change: 0.6 kg (1 lb 5.2 oz) Last BM Date: 11/18/11  Intake/Output from previous day: 02/14 0701 - 02/15 0700 In: 1226.6 [P.O.:890; I.V.:136.6; IV Piggyback:200] Out: 750 [Urine:750] Total I/O In: -  Out: 250 [Urine:250]   Physical Exam: General: Alert, awake, PALE, ?mild jaundice, oriented x3, in no acute distress. HEENT: No bruits, no goiter. Heart: IRREGULAR rate and rhythm, without murmurs, rubs, gallops. Lungs: Clear but decreased BS auscultation bilaterally. Abdomen: Soft, mildly- tender, nondistended, positive bowel sounds. Extremities: New trace edema with positive pedal pulses. Neuro: Grossly intact, nonfocal.    Lab Results: Basic Metabolic Panel:  [facility] 11/19/11 0822  NA 140  K 4.4  CL 108  CO2 22  GLUCOSE 161*  BUN 13  CREATININE 0.78  CALCIUM 9.3  MG --  PHOS --   Liver Function Tests:  [facility] 11/19/11 0822  AST 31  ALT 20  ALKPHOS 71  BILITOT 0.5  PROT 6.6  ALBUMIN 2.5*    Basename 11/19/11 0822  LIPASE 85*  AMYLASE --   No results found for this basename: AMMONIA:2 in the last 72 hours CBC:  Basename 11/19/11 0822  WBC 19.4*  NEUTROABS --  HGB 10.7*  HCT 30.8*  MCV 87.7  PLT 350   Cardiac Enzymes:  Basename 11/19/11 2353 11/19/11 1719 11/19/11 0822  CKTOTAL 29 39 40  CKMB 2.4 3.1 3.7  CKMBINDEX -- -- --  TROPONINI <0.30 <0.30 <0.30   BNP: No results found for this basename: PROBNP:3 in the last 72 hours D-Dimer: No results found for this basename: DDIMER:2 in the last 72  hours CBG:  Basename 11/19/11 2203 11/19/11 1646 11/19/11 1158 11/19/11 0749 11/18/11 2131 11/18/11 1830  GLUCAP 168* 143* 194* 144* 188* 142*   Hemoglobin A1C: No results found for this basename: HGBA1C in the last 72 hours Fasting Lipid Panel: No results found for this basename: CHOL,HDL,LDLCALC,TRIG,CHOLHDL,LDLDIRECT in the last 72 hours Thyroid Function Tests:  Basename 11/19/11 0822  TSH 3.500  T4TOTAL --  FREET4 --  T3FREE --  THYROIDAB --   Anemia Panel: No results found for this basename: VITAMINB12,FOLATE,FERRITIN,TIBC,IRON,RETICCTPCT in the last 72 hours Coagulation: No results found for this basename: LABPROT:2,INR:2 in the last 72 hours Urine Drug Screen: Drugs of Abuse  No results found for this basename: labopia, cocainscrnur, labbenz, amphetmu, thcu, labbarb    Alcohol Level: No results found for this basename: ETH:2 in the last 72 hours Urinalysis: No results found for this basename: COLORURINE:2,APPERANCEUR:2,LABSPEC:2,PHURINE:2,GLUCOSEU:2,HGBUR:2,BILIRUBINUR:2,KETONESUR:2,PROTEINUR:2,UROBILINOGEN:2,NITRITE:2,LEUKOCYTESUR:2 in the last 72 hours Misc. Labs:  Recent Results (from the past 240 hour(s))  CULTURE, BLOOD (ROUTINE X 2)     Status: Normal (Preliminary result)   Collection Time   11/15/11  8:50 AM      Component Value Range Status Comment   Specimen Description BLOOD LEFT ARM   Final    Special Requests BOTTLES DRAWN AEROBIC ONLY 10CC   Final    Culture  Setup Time [id number]   Final    Culture     Final  Value:        BLOOD CULTURE RECEIVED NO GROWTH TO DATE CULTURE WILL BE HELD FOR 5 DAYS BEFORE ISSUING A FINAL NEGATIVE REPORT   Report Status PENDING   Incomplete   CULTURE, BLOOD (ROUTINE X 2)     Status: Normal (Preliminary result)   Collection Time   11/15/11  9:00 AM      Component Value Range Status Comment   Specimen Description BLOOD LEFT HAND   Final    Special Requests BOTTLES DRAWN AEROBIC ONLY 10CC   Final    Culture  Setup Time  [id number]   Final    Culture     Final    Value:        BLOOD CULTURE RECEIVED NO GROWTH TO DATE CULTURE WILL BE HELD FOR 5 DAYS BEFORE ISSUING A FINAL NEGATIVE REPORT   Report Status PENDING   Incomplete   MRSA PCR SCREENING     Status: Normal   Collection Time   11/19/11 10:40 AM      Component Value Range Status Comment   MRSA by PCR NEGATIVE  NEGATIVE  Final     Studies/Results: Ct Abdomen Pelvis W Contrast  11/19/2011  *RADIOLOGY REPORT*  Clinical Data: Worsening abdominal pain post ERCP.  CT ABDOMEN AND PELVIS WITH CONTRAST  Technique:  Multidetector CT imaging of the abdomen and pelvis was performed following the standard protocol during bolus administration of intravenous contrast.  Contrast: OMNIPAQUE IOHEXOL 300 MG/ML IV SOLN  Comparison: CT abdomen pelvis - 11/15/2011; 07/24/2008; abdominal MRI - 11/13/2011; ERCP - 11/13/2011  Findings:  There is grossly unchanged extensive stranding about the pancreatic head extending along the inferior aspect of the right lobe of the liver and along the anterior aspect of the right kidney.  There is an enlarged reactive lymph node which measures approximately 1.3 cm in greatest short axis diameter (image 26, series 2) which appears similar to the 07/2008 abdominal CT however now is surrounded by the pancreatic stranding.  There is no drainable fluid collection or abscess.  There is no definitive evidence of pancreatic necrosis.  No definite pancreatic ductal dilatation.  Normal hepatic contour.  No discrete hyper hypoattenuating hepatic lesions.  Mild intrahepatic biliary duct dilatation.  There is unchanged mild dilatation of the common bile duct measuring approximately 1 cm in greatest diameter (coronal image 38, series 400).  Post cholecystectomy.  There is symmetric enhancement and excretion of the bilateral kidneys.  There is a approximate 3.5 cm hypoattenuating (11 HU) partially exophytic cyst arising from the superior pole right kidney (image  27, series 2).  No urinary obstruction.  The bilateral adrenal glands are normal.  Normal appearance of the spleen.  Ingested enteric contrast extends to the level of the rectum.  No enteric obstruction.  Colonic diverticulosis without evidence of diverticulitis.  The bowel is otherwise normal in course and caliber without wall thickening.  The appendix is not definitely identified.  No pneumoperitoneum, pneumatosis or portal venous gas. Moderate predominately calcified atherosclerotic disease within a normal caliber abdominal aorta.  The major branch vessels of the abdominal aorta, including the IMA, are patent.  Atherosclerotic disease affects the origin of nearly all the major branch vessels of the abdominal aorta, however does not appear to result in hemodynamically significant narrowing.  Incidental note is made of a left-sided retroaortic renal vein.  Scattered shoddy retroperitoneal lymph nodes are not enlarged by CT criteria, presumably reactive in etiology.  No retroperitoneal, mesenteric, pelvic or inguinal lymphadenopathy.  Post hysterectomy.  There is a small amount of free fluid within the pelvis.  The visualized lung bases demonstrate moderate sized bilateral pleural effusions adjacent basilar consolidative opacities favored to represent atelectasis.  Normal heart size.  Calcifications of the mitral valve annulus.  No pericardial effusion.  No acute aggressive osseous abnormalities.  Lumbar spine degenerative change with mild (2-3 mm) of anterolisthesis of L4 upon L5. Small amount of subcutaneous emphysema within the left anterior abdominal wall is presumably secondary to subcutaneous medication injection.  IMPRESSION: 1.  Grossly unchanged findings of pancreatitis with stranding about the head of the pancreas and extending about the adjacent organs. No abscess or drainable fluid collection.  No definitive evidence of pancreatic necrosis or pancreatic ductal dilatation.  No pneumoperitoneum.  2.   Colonic diverticulosis without direct evidence of diverticulitis.  Original Report Authenticated By: Waynard Reeds, M.D.   Dg Chest Port 1 View  11/19/2011  *RADIOLOGY REPORT*  Clinical Data: Congestion.  Low oxygen saturation.  Fever.  PORTABLE CHEST - 1 VIEW  Comparison: 11/12/2011.  Findings: Interval development of asymmetric air space disease greater on the right suggestive of pulmonary edema with small pleural effusion greater on the right.  Left base subsegmental atelectasis.  Infectious infiltrate in the lung bases would be difficult to exclude as the patient has a history of fever.  Heart size within normal limits.  Prominent mitral valve calcifications.  Calcified aorta which is slightly tortuous.  No gross pneumothorax.  IMPRESSION: Interval development of asymmetric air space disease consistent with pulmonary edema with small bilateral pleural effusions. As the patient has a history of fever, infectious infiltrate in the lung bases is not entirely excluded.  Original Report Authenticated By: Fuller Canada, M.D.    Medications: Scheduled Meds:   . antiseptic oral rinse  15 mL Mouth Rinse q12n4p  . chlorhexidine  15 mL Mouth Rinse BID  . enoxaparin (LOVENOX) injection  40 mg Subcutaneous Q24H  . fenofibrate  160 mg Oral Daily  . imipenem-cilastatin  500 mg Intravenous Q8H  . insulin aspart  0-9 Units Subcutaneous TID WC  . iohexol  20 mL Oral Q1 Hr x 2  . latanoprost  1 drop Both Eyes QHS  . lisinopril  5 mg Oral Daily  . metoprolol  5 mg Intravenous Q6H  . mulitivitamin with minerals  1 tablet Oral Daily  . pantoprazole  40 mg Oral Q1200  . potassium chloride  40 mEq Oral BID  . simvastatin  40 mg Oral QPM  . DISCONTD: methylphenidate  5 mg Oral BID WC  . DISCONTD: metoprolol  5 mg Intravenous Q6H   Continuous Infusions:   . diltiazem (CARDIZEM) infusion 15 mg/hr (11/20/11 0600)   PRN Meds:.acetaminophen, ALPRAZolam, diphenhydrAMINE, diphenhydrAMINE, HYDROmorphone  (DILAUDID) injection, hyoscyamine, iohexol, ondansetron (ZOFRAN) IV, ondansetron, sodium chloride  Assessment/Plan:  Principal Problem:  *Pancreatitis Active Problems:  UTI (lower urinary tract infection)  DM (diabetes mellitus)  HTN (hypertension)  Hypercholesteremia  Glaucoma (increased eye pressure)  Hyponatremia  Fever  Leukocytosis   Patient is clearly not improving this AM. Fever overnight 101.Tachycardia to 146. BP still normal. Worsening pain. I have obtained stat labs this AM and UA. I am concerned about progression of pancreatitis.  1. Will contact Dr. Elnoria Howard this AM for expedited evaluation, may need repeat imaging 2. W/U fever, labs, UA, sats were lower than usual, may be opiate affect will check portable CXR. 3. Stat EKG, may need tele 4. May need empiric antibiotics, will  wait for imaging   EKG results 8:25AM : New onset A-Fib with RVR  1. Transfer to Step Down 2. Load and infuse diltiazem, monitor BP, may need to add B Blocker 3. 2D echo, acute A-fib w/u 4. STOP Ritalin (from outpatient)

## 2011-11-20 LAB — URINALYSIS, ROUTINE W REFLEX MICROSCOPIC
Bilirubin Urine: NEGATIVE
Ketones, ur: 15 mg/dL — AB
Nitrite: NEGATIVE
Specific Gravity, Urine: 1.029 (ref 1.005–1.030)
Urobilinogen, UA: 1 mg/dL (ref 0.0–1.0)
pH: 5.5 (ref 5.0–8.0)

## 2011-11-20 LAB — CARDIAC PANEL(CRET KIN+CKTOT+MB+TROPI)
CK, MB: 2.4 ng/mL (ref 0.3–4.0)
Relative Index: INVALID (ref 0.0–2.5)
Troponin I: <0.3 ng/mL (ref <0.30)

## 2011-11-20 LAB — GLUCOSE, CAPILLARY: Glucose-Capillary: 164 mg/dL — ABNORMAL HIGH (ref 70–99)

## 2011-11-20 LAB — URINE MICROSCOPIC-ADD ON

## 2011-11-20 MED ORDER — FUROSEMIDE 10 MG/ML IJ SOLN
40.0000 mg | Freq: Once | INTRAMUSCULAR | Status: AC
  Administered 2011-11-20: 40 mg via INTRAVENOUS
  Filled 2011-11-20: qty 4

## 2011-11-20 MED ORDER — WHITE PETROLATUM GEL
Status: AC
  Administered 2011-11-20: 12:00:00
  Filled 2011-11-20: qty 5

## 2011-11-20 NOTE — Progress Notes (Signed)
Subjective: Pain better. Denies SOB. Tolerating clear PO.  Objective: Vital signs in last 24 hours: Temp:  [98.1 F (36.7 C)-99.2 F (37.3 C)] 98.1 F (36.7 C) (02/15 0400) Pulse Rate:  [56-175] 56  (02/15 0800) Resp:  [12-22] 20  (02/15 0800) BP: (103-143)/(51-70) 127/55 mmHg (02/15 0800) SpO2:  [88 %-97 %] 94 % (02/15 0800) Weight:  [58.6 kg (129 lb 3 oz)] 58.6 kg (129 lb 3 oz) (02/14 1030) Weight change: 0.6 kg (1 lb 5.2 oz) Last BM Date: 11/18/11  Intake/Output from previous day: 02/14 0701 - 02/15 0700 In: 1406.6 [P.O.:890; I.V.:316.6; IV Piggyback:200] Out: 750 [Urine:750]    Physical Exam: General: Alert, awake, oriented x3, in no acute distress. HEENT: No bruits, no goiter. Heart: IRIR, without murmurs, rubs, gallops. Lungs: Scattered crackles, decreased breath sounds in bases Abdomen: Soft, nontender, nondistended, positive bowel sounds. Extremities: No clubbing cyanosis or edema with positive pedal pulses. Neuro: Grossly intact, nonfocal.  Lab Results: Basic Metabolic Panel:  Basename 11/19/11 0822  NA 140  K 4.4  CL 108  CO2 22  GLUCOSE 161*  BUN 13  CREATININE 0.78  CALCIUM 9.3  MG --  PHOS --   Liver Function Tests:  Basename 11/19/11 0822  AST 31  ALT 20  ALKPHOS 71  BILITOT 0.5  PROT 6.6  ALBUMIN 2.5*    Basename 11/19/11 0822  LIPASE 85*  AMYLASE --   CBC:  Basename 11/19/11 0822  WBC 19.4*  NEUTROABS --  HGB 10.7*  HCT 30.8*  MCV 87.7  PLT 350   Cardiac Enzymes:  Basename 11/19/11 2353 11/19/11 1719 11/19/11 0822  CKTOTAL 29 39 40  CKMB 2.4 3.1 3.7  CKMBINDEX -- -- --  TROPONINI <0.30 <0.30 <0.30    Basename 11/20/11 0740 11/19/11 2203 11/19/11 1646 11/19/11 1158 11/19/11 0749 11/18/11 2131  GLUCAP 158* 168* 143* 194* 144* 188*   Thyroid Function Tests:  Basename 11/19/11 0822  TSH 3.500  T4TOTAL --  FREET4 --  T3FREE --  THYROIDAB --    Recent Results (from the past 240 hour(s))  CULTURE, BLOOD (ROUTINE X  2)     Status: Normal (Preliminary result)   Collection Time   11/15/11  8:50 AM      Component Value Range Status Comment   Specimen Description BLOOD LEFT ARM   Final    Special Requests BOTTLES DRAWN AEROBIC ONLY 10CC   Final    Culture  Setup Time [id number]   Final    Culture     Final    Value:        BLOOD CULTURE RECEIVED NO GROWTH TO DATE CULTURE WILL BE HELD FOR 5 DAYS BEFORE ISSUING A FINAL NEGATIVE REPORT   Report Status PENDING   Incomplete   CULTURE, BLOOD (ROUTINE X 2)     Status: Normal (Preliminary result)   Collection Time   11/15/11  9:00 AM      Component Value Range Status Comment   Specimen Description BLOOD LEFT HAND   Final    Special Requests BOTTLES DRAWN AEROBIC ONLY 10CC   Final    Culture  Setup Time [id number]   Final    Culture     Final    Value:        BLOOD CULTURE RECEIVED NO GROWTH TO DATE CULTURE WILL BE HELD FOR 5 DAYS BEFORE ISSUING A FINAL NEGATIVE REPORT   Report Status PENDING   Incomplete   MRSA PCR SCREENING     Status:  Normal   Collection Time   11/19/11 10:40 AM      Component Value Range Status Comment   MRSA by PCR NEGATIVE  NEGATIVE  Final     Studies/Results: Ct Abdomen Pelvis W Contrast  11/19/2011  *RADIOLOGY REPORT*  Clinical Data: Worsening abdominal pain post ERCP.  CT ABDOMEN AND PELVIS WITH CONTRAST  Technique:  Multidetector CT imaging of the abdomen and pelvis was performed following the standard protocol during bolus administration of intravenous contrast.  Contrast: OMNIPAQUE IOHEXOL 300 MG/ML IV SOLN  Comparison: CT abdomen pelvis - 11/15/2011; 07/24/2008; abdominal MRI - 11/13/2011; ERCP - 11/13/2011  Findings:  There is grossly unchanged extensive stranding about the pancreatic head extending along the inferior aspect of the right lobe of the liver and along the anterior aspect of the right kidney.  There is an enlarged reactive lymph node which measures approximately 1.3 cm in greatest short axis diameter (image 26,  series 2) which appears similar to the 07/2008 abdominal CT however now is surrounded by the pancreatic stranding.  There is no drainable fluid collection or abscess.  There is no definitive evidence of pancreatic necrosis.  No definite pancreatic ductal dilatation.  Normal hepatic contour.  No discrete hyper hypoattenuating hepatic lesions.  Mild intrahepatic biliary duct dilatation.  There is unchanged mild dilatation of the common bile duct measuring approximately 1 cm in greatest diameter (coronal image 38, series 400).  Post cholecystectomy.  There is symmetric enhancement and excretion of the bilateral kidneys.  There is a approximate 3.5 cm hypoattenuating (11 HU) partially exophytic cyst arising from the superior pole right kidney (image 27, series 2).  No urinary obstruction.  The bilateral adrenal glands are normal.  Normal appearance of the spleen.  Ingested enteric contrast extends to the level of the rectum.  No enteric obstruction.  Colonic diverticulosis without evidence of diverticulitis.  The bowel is otherwise normal in course and caliber without wall thickening.  The appendix is not definitely identified.  No pneumoperitoneum, pneumatosis or portal venous gas. Moderate predominately calcified atherosclerotic disease within a normal caliber abdominal aorta.  The major branch vessels of the abdominal aorta, including the IMA, are patent.  Atherosclerotic disease affects the origin of nearly all the major branch vessels of the abdominal aorta, however does not appear to result in hemodynamically significant narrowing.  Incidental note is made of a left-sided retroaortic renal vein.  Scattered shoddy retroperitoneal lymph nodes are not enlarged by CT criteria, presumably reactive in etiology.  No retroperitoneal, mesenteric, pelvic or inguinal lymphadenopathy.  Post hysterectomy.  There is a small amount of free fluid within the pelvis.  The visualized lung bases demonstrate moderate sized bilateral  pleural effusions adjacent basilar consolidative opacities favored to represent atelectasis.  Normal heart size.  Calcifications of the mitral valve annulus.  No pericardial effusion.  No acute aggressive osseous abnormalities.  Lumbar spine degenerative change with mild (2-3 mm) of anterolisthesis of L4 upon L5. Small amount of subcutaneous emphysema within the left anterior abdominal wall is presumably secondary to subcutaneous medication injection.  IMPRESSION: 1.  Grossly unchanged findings of pancreatitis with stranding about the head of the pancreas and extending about the adjacent organs. No abscess or drainable fluid collection.  No definitive evidence of pancreatic necrosis or pancreatic ductal dilatation.  No pneumoperitoneum.  2.  Colonic diverticulosis without direct evidence of diverticulitis.  Original Report Authenticated By: Waynard Reeds, M.D.   Dg Chest Port 1 View  11/19/2011  *RADIOLOGY REPORT*  Clinical Data: Congestion.  Low oxygen saturation.  Fever.  PORTABLE CHEST - 1 VIEW  Comparison: 11/12/2011.  Findings: Interval development of asymmetric air space disease greater on the right suggestive of pulmonary edema with small pleural effusion greater on the right.  Left base subsegmental atelectasis.  Infectious infiltrate in the lung bases would be difficult to exclude as the patient has a history of fever.  Heart size within normal limits.  Prominent mitral valve calcifications.  Calcified aorta which is slightly tortuous.  No gross pneumothorax.  IMPRESSION: Interval development of asymmetric air space disease consistent with pulmonary edema with small bilateral pleural effusions. As the patient has a history of fever, infectious infiltrate in the lung bases is not entirely excluded.  Original Report Authenticated By: Fuller Canada, M.D.    Medications: Scheduled Meds:   . antiseptic oral rinse  15 mL Mouth Rinse q12n4p  . chlorhexidine  15 mL Mouth Rinse BID  . enoxaparin  (LOVENOX) injection  40 mg Subcutaneous Q24H  . fenofibrate  160 mg Oral Daily  . imipenem-cilastatin  500 mg Intravenous Q8H  . insulin aspart  0-9 Units Subcutaneous TID WC  . iohexol  20 mL Oral Q1 Hr x 2  . latanoprost  1 drop Both Eyes QHS  . lisinopril  5 mg Oral Daily  . metoprolol  5 mg Intravenous Q6H  . mulitivitamin with minerals  1 tablet Oral Daily  . pantoprazole  40 mg Oral Q1200  . potassium chloride  40 mEq Oral BID  . simvastatin  40 mg Oral QPM  . DISCONTD: methylphenidate  5 mg Oral BID WC  . DISCONTD: metoprolol  5 mg Intravenous Q6H   Continuous Infusions:   . diltiazem (CARDIZEM) infusion 15 mg/hr (11/20/11 0600)   PRN Meds:.acetaminophen, ALPRAZolam, diphenhydrAMINE, diphenhydrAMINE, HYDROmorphone (DILAUDID) injection, hyoscyamine, iohexol, ondansetron (ZOFRAN) IV, ondansetron, sodium chloride  Assessment/Plan: 1. Fever, Leukcytosis:  Patient decompensated yesterday AM with A-Fib, Fever, worsening abdominal pain. -CT scan unchanged, no worse but no improvement either, significant pancreatitis, Dr. Elnoria Howard following post ERCP. - CXR showed infiltrates vs. Edema -WBC up  -no temp spike overnight  -UA was not able to be collected, may need to put in foley, but getting empiric abx so probably would not show infection.  2. A-Fib: -RVR yesterday, now on Diltiazem at 15/hr and Metoprolol 5 q6.Will continue IV until taking better PO then transition to oral. - Will try to decrease dilt gtt to lowest possible rate along with metoprolol to control rate, BP in good range.  3.  Pulmonary Infiltrate/Pulmonary edema: -with fever and leukocytosis will have to presume that this is HCAP, she is currently on Primaxin which is appropriate monotherapy for HCAP and well as any intra-abdominal infection related to pancreatitis, will continue for now until clinical improvement seen. Additionally this CXR may be edema so I will give IV Lasix to diureses, she may have gotten fluid  overloaded in course of pancreatitis treatment. Repeat CXR in AM.  4. Pancreatitis s/p ERCP Complicated, slow to resolve, continue clears. Watch fluid status. Daily CBC and BMET. GI following. Still requiring pain medication.    LOS: 8 days   [facility] Pager: [phone] 11/20/2011, 8:27 AM

## 2011-11-20 NOTE — Progress Notes (Signed)
Patient ID: [name] [name], female   DOB: Oct 05, 1930, 78 y.o.   MRN: [id number] Subjective: No acute events overnight.  Patient remained stable.  Objective: Vital signs in last 24 hours: Temp:  [98.1 F (36.7 C)-99.2 F (37.3 C)] 98.1 F (36.7 C) (02/15 0400) Pulse Rate:  [56-162] 109  (02/15 1000) Resp:  [12-23] 18  (02/15 1000) BP: (103-144)/(51-72) 121/55 mmHg (02/15 1000) SpO2:  [91 %-95 %] 92 % (02/15 1000) Weight:  [58.6 kg (129 lb 3 oz)] 58.6 kg (129 lb 3 oz) (02/14 1030) Last BM Date: 11/18/11  Intake/Output from previous day: 02/14 0701 - 02/15 0700 In: 1406.6 [P.O.:890; I.V.:316.6; IV Piggyback:200] Out: 750 [Urine:750] Intake/Output this shift: Total I/O In: -  Out: 175 [Urine:175]  General appearance: alert and no distress Resp: clear to auscultation bilaterally Cardio: irregularly irregular rhythm GI: tender in the RUQ and epigastrium Extremities: extremities normal, atraumatic, no cyanosis or edema  Lab Results:  [facility] 11/19/11 0822  WBC 19.4*  HGB 10.7*  HCT 30.8*  PLT 350   BMET  Basename 11/19/11 0822  NA 140  K 4.4  CL 108  CO2 22  GLUCOSE 161*  BUN 13  CREATININE 0.78  CALCIUM 9.3   LFT  Basename 11/19/11 0822  PROT 6.6  ALBUMIN 2.5*  AST 31  ALT 20  ALKPHOS 71  BILITOT 0.5  BILIDIR --  IBILI --   PT/INR No results found for this basename: LABPROT:2,INR:2 in the last 72 hours Hepatitis Panel No results found for this basename: HEPBSAG,HCVAB,HEPAIGM,HEPBIGM in the last 72 hours C-Diff No results found for this basename: CDIFFTOX:3 in the last 72 hours Fecal Lactopherrin No results found for this basename: FECLLACTOFRN in the last 72 hours  Studies/Results: Ct Abdomen Pelvis W Contrast  11/19/2011  *RADIOLOGY REPORT*  Clinical Data: Worsening abdominal pain post ERCP.  CT ABDOMEN AND PELVIS WITH CONTRAST  Technique:  Multidetector CT imaging of the abdomen and pelvis was performed following the standard protocol during  bolus administration of intravenous contrast.  Contrast: OMNIPAQUE IOHEXOL 300 MG/ML IV SOLN  Comparison: CT abdomen pelvis - 11/15/2011; 07/24/2008; abdominal MRI - 11/13/2011; ERCP - 11/13/2011  Findings:  There is grossly unchanged extensive stranding about the pancreatic head extending along the inferior aspect of the right lobe of the liver and along the anterior aspect of the right kidney.  There is an enlarged reactive lymph node which measures approximately 1.3 cm in greatest short axis diameter (image 26, series 2) which appears similar to the 07/2008 abdominal CT however now is surrounded by the pancreatic stranding.  There is no drainable fluid collection or abscess.  There is no definitive evidence of pancreatic necrosis.  No definite pancreatic ductal dilatation.  Normal hepatic contour.  No discrete hyper hypoattenuating hepatic lesions.  Mild intrahepatic biliary duct dilatation.  There is unchanged mild dilatation of the common bile duct measuring approximately 1 cm in greatest diameter (coronal image 38, series 400).  Post cholecystectomy.  There is symmetric enhancement and excretion of the bilateral kidneys.  There is a approximate 3.5 cm hypoattenuating (11 HU) partially exophytic cyst arising from the superior pole right kidney (image 27, series 2).  No urinary obstruction.  The bilateral adrenal glands are normal.  Normal appearance of the spleen.  Ingested enteric contrast extends to the level of the rectum.  No enteric obstruction.  Colonic diverticulosis without evidence of diverticulitis.  The bowel is otherwise normal in course and caliber without wall thickening.  The appendix is  not definitely identified.  No pneumoperitoneum, pneumatosis or portal venous gas. Moderate predominately calcified atherosclerotic disease within a normal caliber abdominal aorta.  The major branch vessels of the abdominal aorta, including the IMA, are patent.  Atherosclerotic disease affects the origin of  nearly all the major branch vessels of the abdominal aorta, however does not appear to result in hemodynamically significant narrowing.  Incidental note is made of a left-sided retroaortic renal vein.  Scattered shoddy retroperitoneal lymph nodes are not enlarged by CT criteria, presumably reactive in etiology.  No retroperitoneal, mesenteric, pelvic or inguinal lymphadenopathy.  Post hysterectomy.  There is a small amount of free fluid within the pelvis.  The visualized lung bases demonstrate moderate sized bilateral pleural effusions adjacent basilar consolidative opacities favored to represent atelectasis.  Normal heart size.  Calcifications of the mitral valve annulus.  No pericardial effusion.  No acute aggressive osseous abnormalities.  Lumbar spine degenerative change with mild (2-3 mm) of anterolisthesis of L4 upon L5. Small amount of subcutaneous emphysema within the left anterior abdominal wall is presumably secondary to subcutaneous medication injection.  IMPRESSION: 1.  Grossly unchanged findings of pancreatitis with stranding about the head of the pancreas and extending about the adjacent organs. No abscess or drainable fluid collection.  No definitive evidence of pancreatic necrosis or pancreatic ductal dilatation.  No pneumoperitoneum.  2.  Colonic diverticulosis without direct evidence of diverticulitis.  Original Report Authenticated By: Waynard Reeds, M.D.   Dg Chest Port 1 View  11/19/2011  *RADIOLOGY REPORT*  Clinical Data: Congestion.  Low oxygen saturation.  Fever.  PORTABLE CHEST - 1 VIEW  Comparison: 11/12/2011.  Findings: Interval development of asymmetric air space disease greater on the right suggestive of pulmonary edema with small pleural effusion greater on the right.  Left base subsegmental atelectasis.  Infectious infiltrate in the lung bases would be difficult to exclude as the patient has a history of fever.  Heart size within normal limits.  Prominent mitral valve  calcifications.  Calcified aorta which is slightly tortuous.  No gross pneumothorax.  IMPRESSION: Interval development of asymmetric air space disease consistent with pulmonary edema with small bilateral pleural effusions. As the patient has a history of fever, infectious infiltrate in the lung bases is not entirely excluded.  Original Report Authenticated By: Fuller Canada, M.D.    Medications:  Scheduled:   . antiseptic oral rinse  15 mL Mouth Rinse q12n4p  . chlorhexidine  15 mL Mouth Rinse BID  . enoxaparin (LOVENOX) injection  40 mg Subcutaneous Q24H  . furosemide  40 mg Intravenous Once  . imipenem-cilastatin  500 mg Intravenous Q8H  . insulin aspart  0-9 Units Subcutaneous TID WC  . iohexol  20 mL Oral Q1 Hr x 2  . latanoprost  1 drop Both Eyes QHS  . lisinopril  5 mg Oral Daily  . metoprolol  5 mg Intravenous Q6H  . mulitivitamin with minerals  1 tablet Oral Daily  . pantoprazole  40 mg Oral Q1200  . potassium chloride  40 mEq Oral BID  . simvastatin  40 mg Oral QPM  . DISCONTD: fenofibrate  160 mg Oral Daily  . DISCONTD: metoprolol  5 mg Intravenous Q6H   Continuous:   . DISCONTD: diltiazem (CARDIZEM) infusion 15 mg/hr (11/20/11 0600)    Assessment/Plan: 1) Severe post-ERCP pancreatitis. 2) ABM pain 3) Afib with RVR - stable.   CT scan reveals no change.  I cannot explain why she had a decompensation in her  clinical status.  To be on the safe side I will have her continue with Primaxin.  No overt allergic reaction noted.  Plan: 1) Continue with pain control. 2) Supportive Rx.  LOS: 8 days   Aalaya Yadao D 11/20/2011, 10:21 AM

## 2011-11-21 DIAGNOSIS — J189 Pneumonia, unspecified organism: Secondary | ICD-10-CM | POA: Diagnosis not present

## 2011-11-21 HISTORY — DX: Pneumonia, unspecified organism: J18.9

## 2011-11-21 LAB — BASIC METABOLIC PANEL
CO2: 24 mEq/L (ref 19–32)
Chloride: 101 mEq/L (ref 96–112)
Creatinine, Ser: 0.98 mg/dL (ref 0.50–1.10)
GFR calc Af Amer: 62 mL/min — ABNORMAL LOW (ref >90)
Potassium: 4.5 mEq/L (ref 3.5–5.1)
Sodium: 132 mEq/L — ABNORMAL LOW (ref 135–145)

## 2011-11-21 LAB — CBC
HCT: 30.7 % — ABNORMAL LOW (ref 36.0–46.0)
Hemoglobin: 10.2 g/dL — ABNORMAL LOW (ref 12.0–15.0)
MCV: 87.5 fL (ref 78.0–100.0)
RBC: 3.51 MIL/uL — ABNORMAL LOW (ref 3.87–5.11)
RDW: 13.9 % (ref 11.5–15.5)
WBC: 19.9 10*3/uL — ABNORMAL HIGH (ref 4.0–10.5)

## 2011-11-21 LAB — GLUCOSE, CAPILLARY
Glucose-Capillary: 182 mg/dL — ABNORMAL HIGH (ref 70–99)
Glucose-Capillary: 182 mg/dL — ABNORMAL HIGH (ref 70–99)

## 2011-11-21 LAB — CULTURE, BLOOD (ROUTINE X 2)
Culture  Setup Time: 201302101655
Culture: NO GROWTH

## 2011-11-21 MED ORDER — FLUCONAZOLE 200 MG PO TABS
200.0000 mg | ORAL_TABLET | Freq: Once | ORAL | Status: AC
  Administered 2011-11-21: 200 mg via ORAL
  Filled 2011-11-21: qty 1

## 2011-11-21 MED ORDER — DILTIAZEM HCL 100 MG IV SOLR
5.0000 mg/h | INTRAVENOUS | Status: DC
  Administered 2011-11-21 – 2011-11-22 (×2): 5 mg/h via INTRAVENOUS
  Filled 2011-11-21 (×2): qty 100

## 2011-11-21 MED ORDER — FUROSEMIDE 10 MG/ML IJ SOLN
20.0000 mg | Freq: Once | INTRAMUSCULAR | Status: AC
  Administered 2011-11-21: 20 mg via INTRAVENOUS
  Filled 2011-11-21: qty 2

## 2011-11-21 MED ORDER — METOPROLOL TARTRATE 1 MG/ML IV SOLN
5.0000 mg | INTRAVENOUS | Status: DC | PRN
  Administered 2011-11-21: 5 mg via INTRAVENOUS

## 2011-11-21 MED ORDER — DILTIAZEM HCL ER COATED BEADS 120 MG PO CP24
120.0000 mg | ORAL_CAPSULE | Freq: Every day | ORAL | Status: DC
  Administered 2011-11-21: 120 mg via ORAL
  Filled 2011-11-21 (×2): qty 1

## 2011-11-21 NOTE — Progress Notes (Signed)
Patient ID: [name] [name], female   DOB: 11-05-1929, 78 y.o.   MRN: [id number] [facility] Progress Note  Subjective: She feels somewhat SOb, no severe abdominal pain, just "pressure"  Objective:  Vital signs in last 24 hours: Temp:  [98.4 F (36.9 C)-100.9 F (38.3 C)] 98.7 F (37.1 C) (02/16 0800) Pulse Rate:  [91-111] 94  (02/16 0600) Resp:  [13-24] 15  (02/16 0600) BP: (109-144)/(48-72) 122/51 mmHg (02/16 0600) SpO2:  [92 %-97 %] 97 % (02/16 0600) Weight:  [149 lb 4 oz (67.7 kg)] 149 lb 4 oz (67.7 kg) (02/16 0500) Last BM Date: 11/18/11 General:   Alert,  Well-developed,    in NAD Heart:  Regular rate and rhythm; no murmurs Pulm;decreased BS bilat but clear Abdomen:  Soft, mildly tender and mildlydistended.  bowel soundspresent, no rebound.   Extremities:  Without edema. Neurologic:  Alert and  oriented x4;  grossly normal neurologically. Psych:  Alert and cooperative. Normal mood and affect.  Intake/Output from previous day: 02/15 0701 - 02/16 0700 In: 973.8 [P.O.:720; I.V.:49.8; IV Piggyback:204] Out: 2900 [Urine:2900] Intake/Output this shift: Total I/O In: -  Out: 400 [Urine:400]  Lab Results:  Basename 11/21/11 0600 11/19/11 0822  WBC 19.9* 19.4*  HGB 10.2* 10.7*  HCT 30.7* 30.8*  PLT 438* 350   BMET  Basename 11/21/11 0600 11/19/11 0822  NA 132* 140  K 4.5 4.4  CL 101 108  CO2 24 22  GLUCOSE 153* 161*  BUN 13 13  CREATININE 0.98 0.78  CALCIUM 9.2 9.3   LFT  Basename 11/19/11 0822  PROT 6.6  ALBUMIN 2.5*  AST 31  ALT 20  ALKPHOS 71  BILITOT 0.5  BILIDIR --  IBILI --    Cxr; symmetric airspace disease c/w edema, small bilateral effusions   Assessment / Plan:  #1 Severe post ERCP pancreatitis- stable, slow improvement. Continue Primaxin,full liquids #2 pulm Edema; management per primary team  Start incentive spirometry,up to chair Principal Problem:  *Pancreatitis Active Problems:  UTI (lower urinary tract  infection)  DM (diabetes mellitus)  HTN (hypertension)  Hypercholesteremia  Glaucoma (increased eye pressure)  Hyponatremia  Fever  Leukocytosis     LOS: 9 days   [name] [name]  11/21/2011, 8:31 AM

## 2011-11-21 NOTE — Progress Notes (Signed)
I have taken an interval history, reviewed the chart and examined the patient. I agree with the extender's note, impression and recommendations. Covering for Dr. Elnoria Howard.  Venita Lick. Russella Dar MD Clementeen Graham

## 2011-11-21 NOTE — Progress Notes (Signed)
Subjective: Still with abdominal pressure. Doing in general better today, less swelling. OOB, very deconditioned.  Objective: Vital signs in last 24 hours: Temp:  [98.6 F (37 C)-100.9 F (38.3 C)] 98.7 F (37.1 C) (02/16 0800) Pulse Rate:  [91-128] 100  (02/16 1045) Resp:  [13-24] 15  (02/16 0600) BP: (109-135)/(48-62) 131/56 mmHg (02/16 1029) SpO2:  [92 %-97 %] 97 % (02/16 0600) Weight:  [67.7 kg (149 lb 4 oz)] 67.7 kg (149 lb 4 oz) (02/16 0500) Weight change: 9.1 kg (20 lb 1 oz) Last BM Date: 11/20/11  Intake/Output from previous day: 02/15 0701 - 02/16 0700 In: 973.8 [P.O.:720; I.V.:49.8; IV Piggyback:204] Out: 2900 [Urine:2900] Total I/O In: -  Out: 400 [Urine:400]  Physical Exam: General: Alert, awake, oriented x3, in no acute distress. OOB HEENT: No bruits, no goiter. Heart: IRIR, without murmurs, rubs, gallops. Lungs: Decreased  bilaterally. Abdomen: Soft, nontender, nondistended, positive bowel sounds. Extremities: No clubbing cyanosis or edema with positive pedal pulses. Neuro: Grossly intact, nonfocal.  Lab Results: Basic Metabolic Panel:  Basename 11/21/11 0600 11/19/11 0822  NA 132* 140  K 4.5 4.4  CL 101 108  CO2 24 22  GLUCOSE 153* 161*  BUN 13 13  CREATININE 0.98 0.78  CALCIUM 9.2 9.3  MG -- --  PHOS -- --   Liver Function Tests:  [facility] 11/19/11 0822  AST 31  ALT 20  ALKPHOS 71  BILITOT 0.5  PROT 6.6  ALBUMIN 2.5*    Basename 11/19/11 0822  LIPASE 85*  AMYLASE --   No results found for this basename: AMMONIA:2 in the last 72 hours CBC:  Basename 11/21/11 0600 11/19/11 0822  WBC 19.9* 19.4*  NEUTROABS -- --  HGB 10.2* 10.7*  HCT 30.7* 30.8*  MCV 87.5 87.7  PLT 438* 350   Cardiac Enzymes:  Basename 11/19/11 2353 11/19/11 1719 11/19/11 0822  CKTOTAL 29 39 40  CKMB 2.4 3.1 3.7  CKMBINDEX -- -- --  TROPONINI <0.30 <0.30 <0.30   CBG:  Basename 11/20/11 2149 11/20/11 1718 11/20/11 1251 11/20/11 0740 11/19/11 2203  11/19/11 1646  GLUCAP 164* 172* 236* 158* 168* 143*    Basename 11/19/11 0822  TSH 3.500  T4TOTAL --  FREET4 --  T3FREE --  THYROIDAB --    Basename 11/20/11 0932  COLORURINE YELLOW  LABSPEC 1.029  PHURINE 5.5  GLUCOSEU NEGATIVE  HGBUR NEGATIVE  BILIRUBINUR NEGATIVE  KETONESUR 15*  PROTEINUR NEGATIVE  UROBILINOGEN 1.0  NITRITE NEGATIVE  LEUKOCYTESUR MODERATE*   * Recent Results (from the past 240 hour(s))  CULTURE, BLOOD (ROUTINE X 2)     Status: Normal   Collection Time   11/15/11  8:50 AM      Component Value Range Status Comment   Specimen Description BLOOD LEFT ARM   Final    Special Requests BOTTLES DRAWN AEROBIC ONLY 10CC   Final    Culture  Setup Time [id number]   Final    Culture NO GROWTH 5 DAYS   Final    Report Status 11/21/2011 FINAL   Final   CULTURE, BLOOD (ROUTINE X 2)     Status: Normal   Collection Time   11/15/11  9:00 AM      Component Value Range Status Comment   Specimen Description BLOOD LEFT HAND   Final    Special Requests BOTTLES DRAWN AEROBIC ONLY 10CC   Final    Culture  Setup Time [id number]   Final    Culture NO GROWTH 5 DAYS  Final    Report Status 11/21/2011 FINAL   Final   MRSA PCR SCREENING     Status: Normal   Collection Time   11/19/11 10:40 AM      Component Value Range Status Comment   MRSA by PCR NEGATIVE  NEGATIVE  Final     Studies/Results: Ct Abdomen Pelvis W Contrast  11/19/2011  *RADIOLOGY REPORT*  Clinical Data: Worsening abdominal pain post ERCP.  CT ABDOMEN AND PELVIS WITH CONTRAST  Technique:  Multidetector CT imaging of the abdomen and pelvis was performed following the standard protocol during bolus administration of intravenous contrast.  Contrast: OMNIPAQUE IOHEXOL 300 MG/ML IV SOLN  Comparison: CT abdomen pelvis - 11/15/2011; 07/24/2008; abdominal MRI - 11/13/2011; ERCP - 11/13/2011  Findings:  There is grossly unchanged extensive stranding about the pancreatic head extending along the inferior aspect of  the right lobe of the liver and along the anterior aspect of the right kidney.  There is an enlarged reactive lymph node which measures approximately 1.3 cm in greatest short axis diameter (image 26, series 2) which appears similar to the 07/2008 abdominal CT however now is surrounded by the pancreatic stranding.  There is no drainable fluid collection or abscess.  There is no definitive evidence of pancreatic necrosis.  No definite pancreatic ductal dilatation.  Normal hepatic contour.  No discrete hyper hypoattenuating hepatic lesions.  Mild intrahepatic biliary duct dilatation.  There is unchanged mild dilatation of the common bile duct measuring approximately 1 cm in greatest diameter (coronal image 38, series 400).  Post cholecystectomy.  There is symmetric enhancement and excretion of the bilateral kidneys.  There is a approximate 3.5 cm hypoattenuating (11 HU) partially exophytic cyst arising from the superior pole right kidney (image 27, series 2).  No urinary obstruction.  The bilateral adrenal glands are normal.  Normal appearance of the spleen.  Ingested enteric contrast extends to the level of the rectum.  No enteric obstruction.  Colonic diverticulosis without evidence of diverticulitis.  The bowel is otherwise normal in course and caliber without wall thickening.  The appendix is not definitely identified.  No pneumoperitoneum, pneumatosis or portal venous gas. Moderate predominately calcified atherosclerotic disease within a normal caliber abdominal aorta.  The major branch vessels of the abdominal aorta, including the IMA, are patent.  Atherosclerotic disease affects the origin of nearly all the major branch vessels of the abdominal aorta, however does not appear to result in hemodynamically significant narrowing.  Incidental note is made of a left-sided retroaortic renal vein.  Scattered shoddy retroperitoneal lymph nodes are not enlarged by CT criteria, presumably reactive in etiology.  No  retroperitoneal, mesenteric, pelvic or inguinal lymphadenopathy.  Post hysterectomy.  There is a small amount of free fluid within the pelvis.  The visualized lung bases demonstrate moderate sized bilateral pleural effusions adjacent basilar consolidative opacities favored to represent atelectasis.  Normal heart size.  Calcifications of the mitral valve annulus.  No pericardial effusion.  No acute aggressive osseous abnormalities.  Lumbar spine degenerative change with mild (2-3 mm) of anterolisthesis of L4 upon L5. Small amount of subcutaneous emphysema within the left anterior abdominal wall is presumably secondary to subcutaneous medication injection.  IMPRESSION: 1.  Grossly unchanged findings of pancreatitis with stranding about the head of the pancreas and extending about the adjacent organs. No abscess or drainable fluid collection.  No definitive evidence of pancreatic necrosis or pancreatic ductal dilatation.  No pneumoperitoneum.  2.  Colonic diverticulosis without direct evidence of diverticulitis.  Original Report Authenticated By: Waynard Reeds, M.D.    Medications: Scheduled Meds:   . antiseptic oral rinse  15 mL Mouth Rinse q12n4p  . chlorhexidine  15 mL Mouth Rinse BID  . diltiazem  120 mg Oral Daily  . enoxaparin (LOVENOX) injection  40 mg Subcutaneous Q24H  . imipenem-cilastatin  500 mg Intravenous Q8H  . insulin aspart  0-9 Units Subcutaneous TID WC  . latanoprost  1 drop Both Eyes QHS  . lisinopril  5 mg Oral Daily  . metoprolol  5 mg Intravenous Q6H  . mulitivitamin with minerals  1 tablet Oral Daily  . pantoprazole  40 mg Oral Q1200  . potassium chloride  40 mEq Oral BID  . simvastatin  40 mg Oral QPM   Continuous Infusions:  PRN Meds:.acetaminophen, ALPRAZolam, diphenhydrAMINE, diphenhydrAMINE, HYDROmorphone (DILAUDID) injection, hyoscyamine, ondansetron (ZOFRAN) IV, ondansetron, sodium chloride  Assessment/Plan: 1. Fever, Leukcytosis:  Patient decompensated on 2/14  AM with A-Fib, Fever, worsening abdominal pain.  -CT scan unchanged, no worse but no improvement either, significant pancreatitis, Dr. Elnoria Howard following post ERCP.  - CXR showed infiltrates vs. Edema -she responded very well to IV lasix -WBC still up - mild temp spike overnight to 100.9  -UA -shows 7-10 WBC, covered by primaxin - will also check blood cultures -will repeat a portable CXR in AM   2. A-Fib:  -Rate well controlled yesterday, back in 110s this am off dilt. Will start PO diltiazem and continue IV lopressor for right now. Stable, precipitated by activity OOB this AM. -BP in good range  3. Pulmonary Infiltrate/Pulmonary edema:  Clinically less Volume overloaded. -with fever and leukocytosis will have to presume that this is HCAP, she is currently on Primaxin Day #2 which is appropriate monotherapy for HCAP and well as any intra-abdominal infection related to pancreatitis, will continue for now until clinical improvement seen. Repeat CXR in AM.   4. Pancreatitis s/p ERCP  Complicated, slow to resolve, continue clears. Watch fluid status. Daily CBC and BMET. GI following. Still requiring pain medication.   4. Vaginal Yeast Infection: Secondary to antibiotic use. Will treat with oral Fluconazole.   LOS: 9 days   [facility] Pager: [phone] 11/21/2011, 11:54 AM

## 2011-11-22 ENCOUNTER — Inpatient Hospital Stay (HOSPITAL_COMMUNITY): Payer: Medicare Other

## 2011-11-22 DIAGNOSIS — D72829 Elevated white blood cell count, unspecified: Secondary | ICD-10-CM

## 2011-11-22 LAB — BASIC METABOLIC PANEL
BUN: 12 mg/dL (ref 6–23)
CO2: 28 mEq/L (ref 19–32)
Calcium: 9.7 mg/dL (ref 8.4–10.5)
Chloride: 96 mEq/L (ref 96–112)
Chloride: 94 mEq/L — ABNORMAL LOW (ref 96–112)
Creatinine, Ser: 0.86 mg/dL (ref 0.50–1.10)
Creatinine, Ser: 0.83 mg/dL (ref 0.50–1.10)
GFR calc Af Amer: 73 mL/min — ABNORMAL LOW (ref >90)
Glucose, Bld: 253 mg/dL — ABNORMAL HIGH (ref 70–99)
Potassium: 4.8 mEq/L (ref 3.5–5.1)
Sodium: 133 mEq/L — ABNORMAL LOW (ref 135–145)

## 2011-11-22 LAB — CBC
HCT: 34.3 % — ABNORMAL LOW (ref 36.0–46.0)
Hemoglobin: 11.6 g/dL — ABNORMAL LOW (ref 12.0–15.0)
MCH: 30.1 pg (ref 26.0–34.0)
MCHC: 33.8 g/dL (ref 30.0–36.0)
MCV: 88.2 fL (ref 78.0–100.0)
Platelets: 548 10*3/uL — ABNORMAL HIGH (ref 150–400)
RBC: 3.8 MIL/uL — ABNORMAL LOW (ref 3.87–5.11)
RDW: 13.8 % (ref 11.5–15.5)
RDW: 13.8 % (ref 11.5–15.5)
WBC: 21.2 10*3/uL — ABNORMAL HIGH (ref 4.0–10.5)

## 2011-11-22 LAB — GLUCOSE, CAPILLARY
Glucose-Capillary: 156 mg/dL — ABNORMAL HIGH (ref 70–99)
Glucose-Capillary: 194 mg/dL — ABNORMAL HIGH (ref 70–99)
Glucose-Capillary: 170 mg/dL — ABNORMAL HIGH (ref 70–99)

## 2011-11-22 NOTE — Progress Notes (Addendum)
Subjective: "tired" pain better, SOB improved, tolerating all PO  Objective: Vital signs in last 24 hours: Temp:  [97.8 F (36.6 C)-98.9 F (37.2 C)] 98.5 F (36.9 C) (02/17 0756) Pulse Rate:  [87-128] 89  (02/17 0756) Resp:  [13-22] 15  (02/17 0756) BP: (112-136)/(46-81) 115/51 mmHg (02/17 0756) SpO2:  [95 %-99 %] 95 % (02/17 0756) Weight:  [65.4 kg (144 lb 2.9 oz)] 65.4 kg (144 lb 2.9 oz) (02/17 0500) Weight change: -2.3 kg (-5 lb 1.1 oz) Last BM Date: 11/21/11  Intake/Output from previous day: 02/16 0701 - 02/17 0700 In: 540 [P.O.:480; I.V.:60] Out: 5000 [Urine:5000] Total I/O In: -  Out: 450 [Urine:450]   Physical Exam: General: Alert, awake, oriented x3, in no acute distress. HEENT: No bruits, no goiter. Heart: IRIR, without murmurs, rubs, gallops. Lungs: Clear to auscultation bilaterally. Abdomen: Soft, nontender, nondistended, positive bowel sounds. Extremities: No clubbing cyanosis or edema with positive pedal pulses. Neuro: Grossly intact, nonfocal.  Lab Results: Basic Metabolic Panel:  Basename 11/22/11 0450 11/21/11 0600  NA 133* 132*  K 4.6 4.5  CL 96 101  CO2 28 24  GLUCOSE 152* 153*  BUN 12 13  CREATININE 0.86 0.98  CALCIUM 9.7 9.2  MG -- --  PHOS -- --   CBC:  Basename 11/22/11 0450 11/21/11 0600  WBC 21.2* 19.9*  NEUTROABS -- --  HGB 11.2* 10.2*  HCT 33.5* 30.7*  MCV 88.2 87.5  PLT 548* 438*   Cardiac Enzymes:  Basename 11/19/11 2353 11/19/11 1719  CKTOTAL 29 39  CKMB 2.4 3.1  CKMBINDEX -- --  TROPONINI <0.30 <0.30   BNP: No results found for this basename: PROBNP:3 in the last 72 hours D-Dimer: No results found for this basename: DDIMER:2 in the last 72 hours CBG:  Basename 11/22/11 0757 11/21/11 1237 11/21/11 0847 11/21/11 0818 11/21/11 0534 11/20/11 2149  GLUCAP 194* 182* 152* 182* 156* 164*   Urinalysis:  Basename 11/20/11 0932  COLORURINE YELLOW  LABSPEC 1.029  PHURINE 5.5  GLUCOSEU NEGATIVE  HGBUR NEGATIVE    BILIRUBINUR NEGATIVE  KETONESUR 15*  PROTEINUR NEGATIVE  UROBILINOGEN 1.0  NITRITE NEGATIVE  LEUKOCYTESUR MODERATE*    Recent Results (from the past 240 hour(s))  CULTURE, BLOOD (ROUTINE X 2)     Status: Normal   Collection Time   11/15/11  8:50 AM      Component Value Range Status Comment   Specimen Description BLOOD LEFT ARM   Final    Special Requests BOTTLES DRAWN AEROBIC ONLY 10CC   Final    Culture  Setup Time [id number]   Final    Culture NO GROWTH 5 DAYS   Final    Report Status 11/21/2011 FINAL   Final   CULTURE, BLOOD (ROUTINE X 2)     Status: Normal   Collection Time   11/15/11  9:00 AM      Component Value Range Status Comment   Specimen Description BLOOD LEFT HAND   Final    Special Requests BOTTLES DRAWN AEROBIC ONLY 10CC   Final    Culture  Setup Time [id number]   Final    Culture NO GROWTH 5 DAYS   Final    Report Status 11/21/2011 FINAL   Final   MRSA PCR SCREENING     Status: Normal   Collection Time   11/19/11 10:40 AM      Component Value Range Status Comment   MRSA by PCR NEGATIVE  NEGATIVE  Final   CULTURE, BLOOD (ROUTINE X  2)     Status: Normal (Preliminary result)   Collection Time   11/21/11  1:30 PM      Component Value Range Status Comment   Specimen Description BLOOD RIGHT ARM   Final    Special Requests BOTTLES DRAWN AEROBIC ONLY 5CC   Final    Culture  Setup Time [id number]   Final    Culture     Final    Value:        BLOOD CULTURE RECEIVED NO GROWTH TO DATE CULTURE WILL BE HELD FOR 5 DAYS BEFORE ISSUING A FINAL NEGATIVE REPORT   Report Status PENDING   Incomplete   CULTURE, BLOOD (ROUTINE X 2)     Status: Normal (Preliminary result)   Collection Time   11/21/11  4:28 PM      Component Value Range Status Comment   Specimen Description BLOOD RIGHT HAND   Final    Special Requests BOTTLES DRAWN AEROBIC ONLY [facility]   Final    Culture  Setup Time [id number]   Final    Culture     Final    Value:        BLOOD CULTURE RECEIVED NO GROWTH TO  DATE CULTURE WILL BE HELD FOR 5 DAYS BEFORE ISSUING A FINAL NEGATIVE REPORT   Report Status PENDING   Incomplete     Studies/Results: Dg Chest Port 1 View  11/22/2011  *RADIOLOGY REPORT*  Clinical Data: Evaluate pulmonary edema  PORTABLE CHEST - 1 VIEW  Comparison: None 11/19/2011 and 11/12/2011.  Chest CT 08/06/2003.  Findings: Heart size is stable.  Mitral annulus calcifications noted.  Atherosclerotic calcification of the thoracic aortic arch. Thickening of the minor fissure. Bilateral interstitial and airspace opacities are most prominent in the perihilar regions and lung bases. Slight improvement aeration of the right lung compared to the 11/19/2011. Hazy opacity the left costophrenic angle, for which a pleural effusion cannot be excluded.  Previously, the right costophrenic angle was blunted on 11/19/2011, this is no longer present on today's study.  IMPRESSION: 1.  Bilateral interstitial airspace disease is favored to be due to pulmonary edema.  Slight improved aeration of the right lung compared to recent prior. Infection is difficult to exclude prior chest radiograph. 2.  Cannot exclude small pleural effusion on the left.  No visible effusion on the right on today's study.  Original Report Authenticated By: Britta Mccreedy, M.D.    Medications: Scheduled Meds:   . antiseptic oral rinse  15 mL Mouth Rinse q12n4p  . chlorhexidine  15 mL Mouth Rinse BID  . enoxaparin (LOVENOX) injection  40 mg Subcutaneous Q24H  . fluconazole  200 mg Oral Once  . furosemide  20 mg Intravenous Once  . imipenem-cilastatin  500 mg Intravenous Q8H  . insulin aspart  0-9 Units Subcutaneous TID WC  . latanoprost  1 drop Both Eyes QHS  . lisinopril  5 mg Oral Daily  . metoprolol  5 mg Intravenous Q6H  . mulitivitamin with minerals  1 tablet Oral Daily  . pantoprazole  40 mg Oral Q1200  . potassium chloride  40 mEq Oral BID  . simvastatin  40 mg Oral QPM  . DISCONTD: diltiazem  120 mg Oral Daily   Continuous  Infusions:   . diltiazem (CARDIZEM) infusion 5 mg/hr (11/22/11 0900)   PRN Meds:.acetaminophen, ALPRAZolam, diphenhydrAMINE, diphenhydrAMINE, HYDROmorphone (DILAUDID) injection, hyoscyamine, metoprolol, ondansetron (ZOFRAN) IV, ondansetron, sodium chloride  Assessment/Plan:  Principal Problem:  *Pancreatitis Active Problems:  HCAP (healthcare-associated pneumonia)  UTI (  lower urinary tract infection)  DM (diabetes mellitus)  HTN (hypertension)  Hypercholesteremia  Glaucoma (increased eye pressure)  Hyponatremia  Fever  Leukocytosis  1. Fever, Leukcytosis:  Patient decompensated on 2/14 AM with A-Fib, Fever, worsening abdominal pain.  -CT scan 2/14 unchanged, no worse but no improvement either, significant pancreatitis, Dr. Elnoria Howard following post ERCP.  - CXR showed infiltrates vs. Edema -she responded very well to IV lasix  -WBC still increasing infection vs. demarg/stress - No fevers -- Blood cultures neg  -CXR interval improvement, continue diuresis - CONTINUE EMPIRIC ANTIBIOTICS (primaxin) FOR PNA and POST-ERCP PANCREATITIS?EARLY INFECTION  2. A-Fib, probably paroxysmal, although seems to be persistent at this point, setting of acute pancreatitis:  -Still in A-Fib on 5 diltiazem HR 90's, will maintain for anotehr 24 hours and transition to oral therapy. If not stable today will obtain cardiology consult for evaluation. -BP in good range  -CHADS2 risk score is 3 so she will likely need anticoagulation if this persists either with rivaroxiban or warfarin. For now I will hold anti-coag in acute setting given her pancreatitis.  3. Pulmonary Infiltrate/Pulmonary edema: Clinically less Volume overloaded.  -with fever and leukocytosis will have to presume that this is HCAP, she is currently on Primaxin Day #3 which is appropriate monotherapy for HCAP and well as any intra-abdominal infection related to pancreatitis, will continue for now until clinical improvement seen. Repeat CXR in AM.    4. Pancreatitis s/p ERCP  Complicated, slow to resolve,advance diet today. Watch fluid status. Daily CBC and BMET. GI following. Still requiring pain medication.   4. Vaginal Yeast Infection: Secondary to antibiotic use. Will treat with oral Fluconazole.    LOS: 10 days   [name] [name] [facility]  11/22/2011, 9:47 AM

## 2011-11-22 NOTE — Progress Notes (Addendum)
Patient ID: Ngozi Alvidrez, female   DOB: November 05, 1929, 78 y.o.   MRN: [id number]  [facility] Progress Note  Subjective: Feels tired. No change in abd pain. Tolerating full liquids.  Objective:  Vital signs in last 24 hours: Temp:  [97.8 F (36.6 C)-98.9 F (37.2 C)] 98.5 F (36.9 C) (02/17 0756) Pulse Rate:  [87-128] 89  (02/17 0756) Resp:  [13-22] 15  (02/17 0756) BP: (112-136)/(46-81) 115/51 mmHg (02/17 0756) SpO2:  [95 %-99 %] 95 % (02/17 0756) Weight:  [144 lb 2.9 oz (65.4 kg)] 144 lb 2.9 oz (65.4 kg) (02/17 0500) Last BM Date: 11/20/11 General:   Alert,  Well-developed,  white female in NAD Heart:  Regular rate and rhythm; no murmurs Abdomen:  Soft, mild RUQ tenderness and nondistended. Normal bowel sounds, without guarding, and without rebound.   Extremities:  Without edema. Neurologic:  Alert and  oriented x4;  grossly normal neurologically. Psych:  Alert and cooperative. Normal mood and affect.  Intake/Output from previous day: 02/16 0701 - 02/17 0700 In: 540 [P.O.:480; I.V.:60] Out: 5000 [Urine:5000] Intake/Output this shift: Total I/O In: -  Out: 450 [Urine:450]  Lab Results:  Basename 11/22/11 0450 11/21/11 0600  WBC 21.2* 19.9*  HGB 11.2* 10.2*  HCT 33.5* 30.7*  PLT 548* 438*   BMET  Basename 11/22/11 0450 11/21/11 0600  NA 133* 132*  K 4.6 4.5  CL 96 101  CO2 28 24  GLUCOSE 152* 153*  BUN 12 13  CREATININE 0.86 0.98  CALCIUM 9.7 9.2   Studies/Results: Dg Chest Port 1 View  11/22/2011  *RADIOLOGY REPORT*  Clinical Data: Evaluate pulmonary edema  PORTABLE CHEST - 1 VIEW  Comparison: None 11/19/2011 and 11/12/2011.  Chest CT 08/06/2003.  Findings: Heart size is stable.  Mitral annulus calcifications noted.  Atherosclerotic calcification of the thoracic aortic arch. Thickening of the minor fissure. Bilateral interstitial and airspace opacities are most prominent in the perihilar regions and lung bases. Slight improvement aeration of the  right lung compared to the 11/19/2011. Hazy opacity the left costophrenic angle, for which a pleural effusion cannot be excluded.  Previously, the right costophrenic angle was blunted on 11/19/2011, this is no longer present on today's study.  IMPRESSION: 1.  Bilateral interstitial airspace disease is favored to be due to pulmonary edema.  Slight improved aeration of the right lung compared to recent prior. Infection is difficult to exclude prior chest radiograph. 2.  Cannot exclude small pleural effusion on the left.  No visible effusion on the right on today's study.  Original Report Authenticated By: Britta Mccreedy, M.D.    Assessment / Plan: 1. Severe post ERCP pancreatitis - stable, slow improvement. Continue Primaxin, full liquids  2. Pulm Edema; management per primary team 3. Leukocytosis - WBC has increased to 21  Dr. Elnoria Howard will resume care tomorrow.   Principal Problem:  *Pancreatitis Active Problems:  UTI (lower urinary tract infection)  DM (diabetes mellitus)  HTN (hypertension)  Hypercholesteremia  Glaucoma (increased eye pressure)  Hyponatremia  Fever  Leukocytosis  HCAP (healthcare-associated pneumonia)    LOS: 10 days   August Gosser T. Russella Dar MD [facility] 11/22/2011, 9:27 AM

## 2011-11-23 LAB — GLUCOSE, CAPILLARY

## 2011-11-23 MED ORDER — METOPROLOL TARTRATE 50 MG PO TABS
50.0000 mg | ORAL_TABLET | Freq: Two times a day (BID) | ORAL | Status: DC
  Filled 2011-11-23: qty 1

## 2011-11-23 MED ORDER — DILTIAZEM HCL 100 MG IV SOLR
5.0000 mg/h | INTRAVENOUS | Status: DC
  Administered 2011-11-23: 10 mg/h via INTRAVENOUS
  Filled 2011-11-23: qty 100

## 2011-11-23 MED ORDER — DILTIAZEM HCL 100 MG IV SOLR
5.0000 mg/h | INTRAVENOUS | Status: DC
  Filled 2011-11-23: qty 100

## 2011-11-23 MED ORDER — FUROSEMIDE 20 MG PO TABS
20.0000 mg | ORAL_TABLET | Freq: Every day | ORAL | Status: DC
  Administered 2011-11-23 – 2011-11-24 (×2): 20 mg via ORAL
  Filled 2011-11-23 (×2): qty 1

## 2011-11-23 MED ORDER — DILTIAZEM HCL 30 MG PO TABS
30.0000 mg | ORAL_TABLET | Freq: Four times a day (QID) | ORAL | Status: DC
  Administered 2011-11-23: 30 mg via ORAL
  Filled 2011-11-23 (×3): qty 1

## 2011-11-23 MED ORDER — SODIUM CHLORIDE 0.9 % IV SOLN
250.0000 mg | Freq: Four times a day (QID) | INTRAVENOUS | Status: DC
  Administered 2011-11-23 – 2011-11-28 (×19): 250 mg via INTRAVENOUS
  Filled 2011-11-23 (×22): qty 250

## 2011-11-23 MED ORDER — METOPROLOL TARTRATE 100 MG PO TABS
100.0000 mg | ORAL_TABLET | Freq: Two times a day (BID) | ORAL | Status: DC
  Administered 2011-11-23 – 2011-11-25 (×5): 100 mg via ORAL
  Filled 2011-11-23 (×7): qty 1

## 2011-11-23 MED ORDER — BIOTENE DRY MOUTH MT LIQD: 15.0000 mL | OROMUCOSAL | Status: DC | PRN

## 2011-11-23 MED ORDER — METOPROLOL TARTRATE 50 MG PO TABS
50.0000 mg | ORAL_TABLET | Freq: Once | ORAL | Status: AC
  Administered 2011-11-23: 50 mg via ORAL
  Filled 2011-11-23: qty 1

## 2011-11-23 MED ORDER — BIOTENE DRY MOUTH MT LIQD
15.0000 mL | Freq: Two times a day (BID) | OROMUCOSAL | Status: DC
  Administered 2011-11-23 – 2011-11-30 (×13): 15 mL via OROMUCOSAL

## 2011-11-23 NOTE — Progress Notes (Signed)
Inpatient Diabetes Program Recommendations  AACE/ADA: New Consensus Statement on Inpatient Glycemic Control (2009)  Target Ranges:  Prepandial:   less than 140 mg/dL      Peak postprandial:   less than 180 mg/dL (1-2 hours)      Critically ill patients:  140 - 180 mg/dL   Results for FAWN, DESROCHER (MRN [id number]) as of 11/23/2011 09:34  Ref. Range 11/22/2011 07:57  Glucose-Capillary Latest Range: 70-99 mg/dL 782 (H)   Results for EMMACLAIRE, SWITALA (MRN [id number]) as of 11/23/2011 09:34  Ref. Range 11/23/2011 08:13  Glucose-Capillary Latest Range: 70-99 mg/dL 578 (H)    Inpatient Diabetes Program Recommendations Insulin - Basal: Fasting sugars elevated x2 days- Please consider starting low dose Lantus while in-patient- Lantus 10 units QHS. Correction (SSI): Patient currently on Novolog Sensitive SSI tid with meals.  Note: Will follow. Ambrose Finland RN, MSN, CDE Diabetes Coordinator Inpatient Diabetes Program [phone]

## 2011-11-23 NOTE — Progress Notes (Signed)
Dr. Phillips Odor had originally ordered cardizem gtt d/c'd 1 hour after giving po dose.  One hour after receiving po cardizem, pt's HR 100s-110s with spikes up to 140s.  Dr. Phillips Odor notified.  Orders to be placed by Dr. Phillips Odor.   Roselie Awkward, RN

## 2011-11-23 NOTE — Progress Notes (Signed)
Subjective: "I actually feel like I am getting better this afternoon". Still having right-side flank/CW pain, can reproduce, worse with deep inspiration. Mostly described as pressure.  Objective: Vital signs in last 24 hours: Temp:  [97.9 F (36.6 C)-99.1 F (37.3 C)] 98.1 F (36.7 C) (02/18 1200) Pulse Rate:  [76-124] 124  (02/18 1250) Resp:  [12-24] 12  (02/18 1200) BP: (107-130)/(40-87) 115/62 mmHg (02/18 1200) SpO2:  [94 %-97 %] 97 % (02/18 1250) Weight:  [65.2 kg (143 lb 11.8 oz)] 65.2 kg (143 lb 11.8 oz) (02/18 0500) Weight change: -0.2 kg (-7.1 oz) Last BM Date: 11/21/11  Intake/Output from previous day: 02/17 0701 - 02/18 0700 In: 135 [I.V.:120] Out: 2225 [Urine:2225] Total I/O In: 260 [I.V.:40; Other:120; IV Piggyback:100] Out: 1300 [Urine:1300]   Physical Exam: General: Alert, awake, oriented x3, in no acute distress. HEENT: No bruits, no goiter. Heart: IRIR, without murmurs, rubs, gallops. Lungs: Clear to auscultation bilaterally. Abdomen: Soft, nontender, nondistended, positive bowel sounds right flank mild ttp. Extremities: No clubbing cyanosis or edema with positive pedal pulses. Neuro: Grossly intact, nonfocal.   Lab Results: Basic Metabolic Panel:  Basename 11/22/11 1023 11/22/11 0450  NA 131* 133*  K 4.8 4.6  CL 94* 96  CO2 26 28  GLUCOSE 253* 152*  BUN 12 12  CREATININE 0.83 0.86  CALCIUM 9.6 9.7  MG -- --  PHOS -- --   CBC:  Basename 11/22/11 1023 11/22/11 0450  WBC 21.9* 21.2*  NEUTROABS -- --  HGB 11.6* 11.2*  HCT 34.3* 33.5*  MCV 88.9 88.2  PLT 558* 548*   CBG:  Basename 11/23/11 1244 11/23/11 0813 11/22/11 2140 11/22/11 1732 11/22/11 1230 11/22/11 0757  GLUCAP 169* 209* 170* 221* 185* 194*    Recent Results (from the past 240 hour(s))  CULTURE, BLOOD (ROUTINE X 2)     Status: Normal   Collection Time   11/15/11  8:50 AM      Component Value Range Status Comment   Specimen Description BLOOD LEFT ARM   Final    Special  Requests BOTTLES DRAWN AEROBIC ONLY 10CC   Final    Culture  Setup Time [id number]   Final    Culture NO GROWTH 5 DAYS   Final    Report Status 11/21/2011 FINAL   Final   CULTURE, BLOOD (ROUTINE X 2)     Status: Normal   Collection Time   11/15/11  9:00 AM      Component Value Range Status Comment   Specimen Description BLOOD LEFT HAND   Final    Special Requests BOTTLES DRAWN AEROBIC ONLY 10CC   Final    Culture  Setup Time [id number]   Final    Culture NO GROWTH 5 DAYS   Final    Report Status 11/21/2011 FINAL   Final   MRSA PCR SCREENING     Status: Normal   Collection Time   11/19/11 10:40 AM      Component Value Range Status Comment   MRSA by PCR NEGATIVE  NEGATIVE  Final   CULTURE, BLOOD (ROUTINE X 2)     Status: Normal (Preliminary result)   Collection Time   11/21/11  1:30 PM      Component Value Range Status Comment   Specimen Description BLOOD RIGHT ARM   Final    Special Requests BOTTLES DRAWN AEROBIC ONLY 5CC   Final    Culture  Setup Time [id number]   Final    Culture  Final    Value:        BLOOD CULTURE RECEIVED NO GROWTH TO DATE CULTURE WILL BE HELD FOR 5 DAYS BEFORE ISSUING A FINAL NEGATIVE REPORT   Report Status PENDING   Incomplete   CULTURE, BLOOD (ROUTINE X 2)     Status: Normal (Preliminary result)   Collection Time   11/21/11  4:28 PM      Component Value Range Status Comment   Specimen Description BLOOD RIGHT HAND   Final    Special Requests BOTTLES DRAWN AEROBIC ONLY [facility]   Final    Culture  Setup Time [id number]   Final    Culture     Final    Value:        BLOOD CULTURE RECEIVED NO GROWTH TO DATE CULTURE WILL BE HELD FOR 5 DAYS BEFORE ISSUING A FINAL NEGATIVE REPORT   Report Status PENDING   Incomplete     Studies/Results: Dg Chest Port 1 View  11/22/2011  *RADIOLOGY REPORT*  Clinical Data: Evaluate pulmonary edema  PORTABLE CHEST - 1 VIEW  Comparison: None 11/19/2011 and 11/12/2011.  Chest CT 08/06/2003.  Findings: Heart size is stable.   Mitral annulus calcifications noted.  Atherosclerotic calcification of the thoracic aortic arch. Thickening of the minor fissure. Bilateral interstitial and airspace opacities are most prominent in the perihilar regions and lung bases. Slight improvement aeration of the right lung compared to the 11/19/2011. Hazy opacity the left costophrenic angle, for which a pleural effusion cannot be excluded.  Previously, the right costophrenic angle was blunted on 11/19/2011, this is no longer present on today's study.  IMPRESSION: 1.  Bilateral interstitial airspace disease is favored to be due to pulmonary edema.  Slight improved aeration of the right lung compared to recent prior. Infection is difficult to exclude prior chest radiograph. 2.  Cannot exclude small pleural effusion on the left.  No visible effusion on the right on today's study.  Original Report Authenticated By: Britta Mccreedy, M.D.    Medications: Scheduled Meds:   . antiseptic oral rinse  15 mL Mouth Rinse BID  . diltiazem  30 mg Oral Q6H  . enoxaparin (LOVENOX) injection  40 mg Subcutaneous Q24H  . furosemide  20 mg Oral Daily  . imipenem-cilastatin  250 mg Intravenous Q6H  . insulin aspart  0-9 Units Subcutaneous TID WC  . latanoprost  1 drop Both Eyes QHS  . lisinopril  5 mg Oral Daily  . metoprolol tartrate  50 mg Oral BID  . mulitivitamin with minerals  1 tablet Oral Daily  . pantoprazole  40 mg Oral Q1200  . potassium chloride  40 mEq Oral BID  . simvastatin  40 mg Oral QPM  . DISCONTD: antiseptic oral rinse  15 mL Mouth Rinse q12n4p  . DISCONTD: chlorhexidine  15 mL Mouth Rinse BID  . DISCONTD: imipenem-cilastatin  500 mg Intravenous Q8H  . DISCONTD: metoprolol  5 mg Intravenous Q6H   Continuous Infusions:   . diltiazem (CARDIZEM) infusion 5 mg/hr (11/23/11 1500)   PRN Meds:.acetaminophen, ALPRAZolam, diphenhydrAMINE, diphenhydrAMINE, HYDROmorphone (DILAUDID) injection, hyoscyamine, ondansetron (ZOFRAN) IV, ondansetron,  sodium chloride, DISCONTD: antiseptic oral rinse, DISCONTD: metoprolol  Assessment/Plan:  Principal Problem:  *Pancreatitis Active Problems:  HCAP (healthcare-associated pneumonia)  UTI (lower urinary tract infection)  DM (diabetes mellitus)  HTN (hypertension)  Hypercholesteremia  Glaucoma (increased eye pressure)  Hyponatremia  Fever  Leukocytosis  1. Fever, Leukcytosis:  Patient decompensated on 2/14 AM with A-Fib, Fever, worsening abdominal pain.  -CT scan  2/14 unchanged, no worse but no improvement either, significant pancreatitis, Dr. Elnoria Howard following post ERCP.  - -WBC still increasing infection vs. demarg/stress  - No fevers  -- Blood cultures neg  -CXR interval improvement, continue diuresis  - CONTINUE EMPIRIC ANTIBIOTICS (primaxin) FOR PNA and POST-ERCP PANCREATITIS?EARLY INFECTION  2. A-Fib, probably paroxysmal, although seems to be persistent at this point, setting of acute pancreatitis:  -Still in A-Fib -on 5 diltiazem HR 90's-110s, Difficulty transitioning to equivalent oral therapy. -Will keep on Dilt gtt for now and transition Beta-Blocker to oral, may need Digoxin if BP too low with both CCB and BB.  -Will get cardiology consult if rate control remain difficult.  -BP in good range so far -Metoprolol 100 BID -CHADS2 risk score is 3 so she will likely need anticoagulation if this persists either with rivaroxiban or warfarin. For now I will hold anti-coag in acute setting given her pancreatitis.   3. Pulmonary Infiltrate/Pulmonary edema: -Clinically less Volume overloaded.  -with fever and leukocytosis will have to presume that this is HCAP, she is currently on Primaxin Day #4 which is appropriate monotherapy for HCAP and well as any intra-abdominal infection related to pancreatitis, will continue for now until clinical improvement seen. Repeat -CXR in AM.  -CXR showed infiltrates vs. Edema -she responded very well to IV lasix -2D echo shows diastolic dysfunction EF  65-70 -Started on oral lasix today  4. Pancreatitis s/p ERCP  Complicated, slow to resolve,advance diet today. Watch fluid status. Daily CBC and BMET. GI following. Still requiring pain medication. WBC still elevated but no fevers.  4. Vaginal Yeast Infection: Secondary to antibiotic use. Gave 1 dose fluconazole, will follow for symptom needs.       LOS: 11 days   [facility] Pager: [phone] 11/23/2011, 3:35 PM

## 2011-11-23 NOTE — Progress Notes (Signed)
Physical Therapy Treatment Patient Details Name: [name] [name] MRN: [id number] DOB: 03/24/1930 Today's Date: 11/23/2011  PT Assessment/Plan Comments on Treatment Session: Pt extremely motivated and eager to participate.  BP in sitting 133/74; became dizzy after standing 60 seconds; unable to get BP cuff to work for ~3 minutes while pt seated/resting; once BP taken 116/64.  Pt encouraged to stay upright (OOB or HOB elevated as much as possible to assist with upright tolerance.  Pt reported this is her most painful position for her abdomen, however desperately wants to get better/stronger. PT Plan: Discharge plan remains appropriate;Frequency remains appropriate PT Frequency: Min 3X/week Recommendations for Other Services: OT consult Follow Up Recommendations: Home health PT;Supervision - Intermittent Equipment Recommended: Other (comment) (TBA) PT Goals  Acute Rehab PT Goals PT Goal: Sit to Stand - Progress: Progressing toward goal PT Goal: Stand to Sit - Progress: Progressing toward goal  PT Treatment Precautions/Restrictions  Precautions Precautions: Fall Required Braces or Orthoses: No Restrictions Weight Bearing Restrictions: No Mobility (including Balance) Bed Mobility Bed Mobility: No Transfers Sit to Stand: 5: Supervision;With upper extremity assist;From chair/3-in-1 Sit to Stand Details (indicate cue type and reason): x2 (seated rest); pt required cues x1 for safe hand placement; on 2nd attempt, able to recall correct technique Stand to Sit: 5: Supervision;With upper extremity assist;With armrests;To chair/3-in-1 Stand to Sit Details: x2 (seated rest); pt required cues x1 for safe hand placement; on 2nd attempt, able to recall correct technique Ambulation/Gait Ambulation/Gait: No (pt lightheaded with standing >60 sec & deferred gait)    Exercise  General Exercises - Lower Extremity Ankle Circles/Pumps: AROM;Both;20 reps;Seated;Other (comment) (for ROM and incr  circulation) Mini-Sqauts: AROM;Strengthening;Both;15 reps;Standing (minguard assist for safety) Other Exercises: standing marching with RW x 15 reps each leg End of Session PT - End of Session Equipment Utilized During Treatment: Gait belt Activity Tolerance: Treatment limited secondary to medical complications (Comment) (limited by dizziness; BP cuff non-functional initially) Patient left: in chair;with call bell in reach;with family/visitor present Nurse Communication: Mobility status for transfers;Other (comment) (suspected orthostasis) General Behavior During Session: [facility] for tasks performed Cognition: [facility] for tasks performed  [name] [name] 11/23/2011, 1:48 PM Pager [phone]

## 2011-11-23 NOTE — Progress Notes (Signed)
   CARE MANAGEMENT NOTE 11/23/2011  Patient:  [name] [name], [name] [name]   Account Number:  [id number]  Date Initiated:  11/13/2011  Documentation initiated by:  Donn Pierini  Subjective/Objective Assessment:   Pt admitted with pancreatitis to have ERCP done     Action/Plan:   PTA pt lived at home, was independent with ADLs  pt eval- recs hhpt   Anticipated DC Date:  11/27/2011   Anticipated DC Plan:  HOME W HOME HEALTH SERVICES      DC Planning Services  CM consult      Choice offered to / List presented to:             Status of service:  In process, will continue to follow Medicare Important Message given?   (If response is "NO", the following Medicare IM given date fields will be blank) Date Medicare IM given:   Date Additional Medicare IM given:    Discharge Disposition:    Per UR Regulation:    Comments:  11/23/11 15:21 [name] RN, BSN [phone] patient  is with elevated wbc, temp, on cardizem drip still for afib, patient is very deconditioned.  NCM will continue to follow for dc needs.  11/18/11 16:48 [name] RN, BSN [phone] patient is s/p ERCP, pt is slow to progress, physical therapy recs hhpt.  11/13/11- 1700- Donn Pierini RN, BSN- [phone] Pt to have an ERCP, CM to follow for d/c needs

## 2011-11-23 NOTE — Progress Notes (Signed)
Pharmacy Renal Adjustment:  Renally adjusted Imipenem to 250mg  IV q6h based on weight and current renal function.  Fayne Norrie, PharmD, BCPS Clinical Pharmacist 11/23/2011, 3:05 PM

## 2011-11-23 NOTE — Progress Notes (Signed)
Patient ID: Keydi Giel, female   DOB: November 05, 1929, 78 y.o.   MRN: [id number] Subjective: Feeling better, but she does report having a new right sided pain.  It occurs with movement and reaching out with her right arm or turning around in her bed.  Objective: Vital signs in last 24 hours: Temp:  [97.9 F (36.6 C)-99.1 F (37.3 C)] 97.9 F (36.6 C) (02/18 0400) Pulse Rate:  [67-110] 89  (02/17 2000) Resp:  [11-24] 18  (02/17 2000) BP: (106-135)/(42-69) 117/46 mmHg (02/17 2000) SpO2:  [94 %-98 %] 95 % (02/17 2000) Weight:  [65.2 kg (143 lb 11.8 oz)] 65.2 kg (143 lb 11.8 oz) (02/18 0500) Last BM Date: 11/21/11  Intake/Output from previous day: 02/17 0701 - 02/18 0700 In: 110 [I.V.:110] Out: 2225 [Urine:2225] Intake/Output this shift:    General appearance: alert and no distress Resp: clear to auscultation bilaterally Cardio: regular rate and rhythm, S1, S2 normal, no murmur, click, rub or gallop GI: pancreatitis pain much improved, probable costochondritis/muscular pain in right upper quadrant Extremities: extremities normal, atraumatic, no cyanosis or edema  Lab Results:  Basename 11/22/11 1023 11/22/11 0450 11/21/11 0600  WBC 21.9* 21.2* 19.9*  HGB 11.6* 11.2* 10.2*  HCT 34.3* 33.5* 30.7*  PLT 558* 548* 438*   BMET  Basename 11/22/11 1023 11/22/11 0450 11/21/11 0600  NA 131* 133* 132*  K 4.8 4.6 4.5  CL 94* 96 101  CO2 26 28 24   GLUCOSE 253* 152* 153*  BUN 12 12 13   CREATININE 0.83 0.86 0.98  CALCIUM 9.6 9.7 9.2   LFT No results found for this basename: PROT,ALBUMIN,AST,ALT,ALKPHOS,BILITOT,BILIDIR,IBILI in the last 72 hours PT/INR No results found for this basename: LABPROT:2,INR:2 in the last 72 hours Hepatitis Panel No results found for this basename: HEPBSAG,HCVAB,HEPAIGM,HEPBIGM in the last 72 hours C-Diff No results found for this basename: CDIFFTOX:3 in the last 72 hours Fecal Lactopherrin No results found for this basename: FECLLACTOFRN in the last  72 hours  Studies/Results: Dg Chest Port 1 View  11/22/2011  *RADIOLOGY REPORT*  Clinical Data: Evaluate pulmonary edema  PORTABLE CHEST - 1 VIEW  Comparison: None 11/19/2011 and 11/12/2011.  Chest CT 08/06/2003.  Findings: Heart size is stable.  Mitral annulus calcifications noted.  Atherosclerotic calcification of the thoracic aortic arch. Thickening of the minor fissure. Bilateral interstitial and airspace opacities are most prominent in the perihilar regions and lung bases. Slight improvement aeration of the right lung compared to the 11/19/2011. Hazy opacity the left costophrenic angle, for which a pleural effusion cannot be excluded.  Previously, the right costophrenic angle was blunted on 11/19/2011, this is no longer present on today's study.  IMPRESSION: 1.  Bilateral interstitial airspace disease is favored to be due to pulmonary edema.  Slight improved aeration of the right lung compared to recent prior. Infection is difficult to exclude prior chest radiograph. 2.  Cannot exclude small pleural effusion on the left.  No visible effusion on the right on today's study.  Original Report Authenticated By: Britta Mccreedy, M.D.    Medications:  Scheduled:   . antiseptic oral rinse  15 mL Mouth Rinse q12n4p  . chlorhexidine  15 mL Mouth Rinse BID  . enoxaparin (LOVENOX) injection  40 mg Subcutaneous Q24H  . imipenem-cilastatin  500 mg Intravenous Q8H  . insulin aspart  0-9 Units Subcutaneous TID WC  . latanoprost  1 drop Both Eyes QHS  . lisinopril  5 mg Oral Daily  . metoprolol  5 mg Intravenous Q6H  .  mulitivitamin with minerals  1 tablet Oral Daily  . pantoprazole  40 mg Oral Q1200  . potassium chloride  40 mEq Oral BID  . simvastatin  40 mg Oral QPM   Continuous:   . diltiazem (CARDIZEM) infusion 5 mg/hr (11/22/11 1900)    Assessment/Plan: 1) Severe Post-ERCP pancreatitis. 2) Probable right sided costochondritis/muscular pain.   The abdominal exam reveals that her abdominal pain  from the pancreatitis appears to be improved.  I reproduced her new pain and it appears to be costochondral/muscular in origin.  Plan: 1) Okay with bland diet and advance as tolerated. 2) NSAID for costochondral pain.  If this fails a medrol dose pack can be tried.  LOS: 11 days   Santoria Chason D 11/23/2011, 8:17 AM

## 2011-11-24 ENCOUNTER — Inpatient Hospital Stay (HOSPITAL_COMMUNITY): Payer: Medicare Other

## 2011-11-24 LAB — BASIC METABOLIC PANEL
CO2: 27 mEq/L (ref 19–32)
Chloride: 96 mEq/L (ref 96–112)
Sodium: 131 mEq/L — ABNORMAL LOW (ref 135–145)

## 2011-11-24 LAB — GLUCOSE, CAPILLARY: Glucose-Capillary: 167 mg/dL — ABNORMAL HIGH (ref 70–99)

## 2011-11-24 LAB — CBC
HCT: 33.7 % — ABNORMAL LOW (ref 36.0–46.0)
Hemoglobin: 10.8 g/dL — ABNORMAL LOW (ref 12.0–15.0)
MCV: 90.6 fL (ref 78.0–100.0)
RBC: 3.72 MIL/uL — ABNORMAL LOW (ref 3.87–5.11)
WBC: 19.4 10*3/uL — ABNORMAL HIGH (ref 4.0–10.5)

## 2011-11-24 MED ORDER — DILTIAZEM HCL 100 MG IV SOLR
5.0000 mg/h | INTRAVENOUS | Status: AC
  Administered 2011-11-24: 5 mg/h via INTRAVENOUS
  Filled 2011-11-24: qty 100

## 2011-11-24 MED ORDER — DIGOXIN 0.25 MG/ML IJ SOLN
0.2500 mg | Freq: Once | INTRAMUSCULAR | Status: AC
  Administered 2011-11-24: 0.25 mg via INTRAVENOUS
  Filled 2011-11-24: qty 1

## 2011-11-24 MED ORDER — DIGOXIN 0.25 MG/ML IJ SOLN
0.1250 mg | Freq: Once | INTRAMUSCULAR | Status: AC
  Administered 2011-11-24: 0.125 mg via INTRAVENOUS
  Filled 2011-11-24: qty 0.5

## 2011-11-24 MED ORDER — DIGOXIN 125 MCG PO TABS
0.1250 mg | ORAL_TABLET | Freq: Every day | ORAL | Status: DC
  Administered 2011-11-25: 0.125 mg via ORAL
  Filled 2011-11-24: qty 1

## 2011-11-24 NOTE — Progress Notes (Signed)
Patient ID: [name] [name], female   DOB: 03-26-1930, 78 y.o.   MRN: [id number] Subjective: Feeling better.  Tolerating bland diet.  Objective: Vital signs in last 24 hours: Temp:  [97.8 F (36.6 C)-98.6 F (37 C)] 98.5 F (36.9 C) (02/19 0437) Pulse Rate:  [62-124] 62  (02/19 0437) Resp:  [12-17] 17  (02/19 0437) BP: (100-128)/(48-62) 105/50 mmHg (02/19 0437) SpO2:  [93 %-97 %] 93 % (02/19 0437) Weight:  [62.6 kg (138 lb 0.1 oz)] 62.6 kg (138 lb 0.1 oz) (02/19 0500) Last BM Date: 11/21/11  Intake/Output from previous day: 02/18 0701 - 02/19 0700 In: 840 [P.O.:240; I.V.:55; IV Piggyback:200] Out: 1600 [Urine:1600] Intake/Output this shift:    General appearance: alert and no distress Resp: clear to auscultation bilaterally Cardio: irregularly irregular rhythm GI: soft, non-tender; bowel sounds normal; no masses,  no organomegaly Extremities: extremities normal, atraumatic, no cyanosis or edema  Lab Results:  Basename 11/24/11 0505 11/22/11 1023 11/22/11 0450  WBC 19.4* 21.9* 21.2*  HGB 10.8* 11.6* 11.2*  HCT 33.7* 34.3* 33.5*  PLT 643* 558* 548*   BMET  Basename 11/24/11 0505 11/22/11 1023 11/22/11 0450  NA 131* 131* 133*  K 6.0* 4.8 4.6  CL 96 94* 96  CO2 27 26 28   GLUCOSE 189* 253* 152*  BUN 14 12 12   CREATININE 0.95 0.83 0.86  CALCIUM 9.8 9.6 9.7   LFT No results found for this basename: PROT,ALBUMIN,AST,ALT,ALKPHOS,BILITOT,BILIDIR,IBILI in the last 72 hours PT/INR No results found for this basename: LABPROT:2,INR:2 in the last 72 hours Hepatitis Panel No results found for this basename: HEPBSAG,HCVAB,HEPAIGM,HEPBIGM in the last 72 hours C-Diff No results found for this basename: CDIFFTOX:3 in the last 72 hours Fecal Lactopherrin No results found for this basename: FECLLACTOFRN in the last 72 hours  Studies/Results: No results found.  Medications:  Scheduled:   . antiseptic oral rinse  15 mL Mouth Rinse BID  . enoxaparin (LOVENOX) injection  40  mg Subcutaneous Q24H  . furosemide  20 mg Oral Daily  . imipenem-cilastatin  250 mg Intravenous Q6H  . insulin aspart  0-9 Units Subcutaneous TID WC  . latanoprost  1 drop Both Eyes QHS  . lisinopril  5 mg Oral Daily  . metoprolol tartrate  100 mg Oral BID  . metoprolol tartrate  50 mg Oral Once  . mulitivitamin with minerals  1 tablet Oral Daily  . pantoprazole  40 mg Oral Q1200  . potassium chloride  40 mEq Oral BID  . simvastatin  40 mg Oral QPM  . DISCONTD: antiseptic oral rinse  15 mL Mouth Rinse q12n4p  . DISCONTD: chlorhexidine  15 mL Mouth Rinse BID  . DISCONTD: diltiazem  30 mg Oral Q6H  . DISCONTD: imipenem-cilastatin  500 mg Intravenous Q8H  . DISCONTD: metoprolol  5 mg Intravenous Q6H  . DISCONTD: metoprolol tartrate  50 mg Oral BID   Continuous:   . diltiazem (CARDIZEM) infusion 5 mg/hr (11/24/11 0600)  . DISCONTD: diltiazem (CARDIZEM) infusion 5 mg/hr (11/23/11 1500)  . DISCONTD: diltiazem (CARDIZEM) infusion 5 mg/hr (11/23/11 1700)  . DISCONTD: diltiazem (CARDIZEM) infusion 10 mg/hr (11/23/11 1845)    Assessment/Plan: 1) Severe Post-ERCP pancreatitis.  2) Probable right sided costochondritis/muscular pain.  3) Afib.   She is much better today.  The pain is much less in her abdomen.    Plan:  1) Continue with Primaxin for now. 2) No change to the bland diet at this time.  LOS: 12 days   [name] [name] D 11/24/2011, 7:27  AM

## 2011-11-24 NOTE — Progress Notes (Signed)
Subjective: Out of bed to chair. Currently denies shortness of breath or chest pain. No redness of tachycardia palpitations when he occurred. Overall states she is feeling much better. She has endorsed a sensation of dizziness when mobilizing ever since admission.  Objective: Vital signs in last 24 hours: Temp:  [97.8 F (36.6 C)-98.8 F (37.1 C)] 98.8 F (37.1 C) (02/19 1200) Pulse Rate:  [62-103] 94  (02/19 1200) Resp:  [10-17] 13  (02/19 1200) BP: (100-128)/(47-90) 105/90 mmHg (02/19 1118) SpO2:  [93 %-97 %] 97 % (02/19 1200) Weight:  [62.6 kg (138 lb 0.1 oz)] 62.6 kg (138 lb 0.1 oz) (02/19 0500) Weight change: -2.6 kg (-5 lb 11.7 oz) Last BM Date: 11/23/11  Intake/Output from previous day: 02/18 0701 - 02/19 0700 In: 1163.4 [P.O.:240; I.V.:163.4; IV Piggyback:400] Out: 1600 [Urine:1600] Total I/O In: 200 [I.V.:25; Other:75; IV Piggyback:100] Out: 250 [Urine:250]   Physical Exam: General: Alert, awake, oriented x3, in no acute distress. HEENT: No bruits, no goiter. Heart: IRIR, without murmurs, rubs, gallops. Lungs: Clear to auscultation bilaterally. Abdomen: Soft, nontender, nondistended, positive bowel sounds. Extremities: No clubbing cyanosis or edema with positive pedal pulses. Neuro: Grossly intact, nonfocal.  Lab Results: Basic Metabolic Panel:  Basename 11/24/11 0505 11/22/11 1023  NA 131* 131*  K 6.0* 4.8  CL 96 94*  CO2 27 26  GLUCOSE 189* 253*  BUN 14 12  CREATININE 0.95 0.83  CALCIUM 9.8 9.6  MG -- --  PHOS -- --   CBC:  Basename 11/24/11 0505 11/22/11 1023  WBC 19.4* 21.9*  NEUTROABS -- --  HGB 10.8* 11.6*  HCT 33.7* 34.3*  MCV 90.6 88.9  PLT 643* 558*   Cardiac Enzymes: No results found for this basename: CKTOTAL:3,CKMB:3,CKMBINDEX:3,TROPONINI:3 in the last 72 hours BNP: No results found for this basename: PROBNP:3 in the last 72 hours D-Dimer: No results found for this basename: DDIMER:2 in the last 72 hours CBG:  Basename 11/24/11  1230 11/24/11 0732 11/23/11 2152 11/23/11 1722 11/23/11 1244 11/23/11 0813  GLUCAP 167* 163* 176* 199* 169* 209*   Urinalysis: No results found for this basename: COLORURINE:2,APPERANCEUR:2,LABSPEC:2,PHURINE:2,GLUCOSEU:2,HGBUR:2,BILIRUBINUR:2,KETONESUR:2,PROTEINUR:2,UROBILINOGEN:2,NITRITE:2,LEUKOCYTESUR:2 in the last 72 hours  Recent Results (from the past 240 hour(s))  CULTURE, BLOOD (ROUTINE X 2)     Status: Normal   Collection Time   11/15/11  8:50 AM      Component Value Range Status Comment   Specimen Description BLOOD LEFT ARM   Final    Special Requests BOTTLES DRAWN AEROBIC ONLY 10CC   Final    Culture  Setup Time [id number]   Final    Culture NO GROWTH 5 DAYS   Final    Report Status 11/21/2011 FINAL   Final   CULTURE, BLOOD (ROUTINE X 2)     Status: Normal   Collection Time   11/15/11  9:00 AM      Component Value Range Status Comment   Specimen Description BLOOD LEFT HAND   Final    Special Requests BOTTLES DRAWN AEROBIC ONLY 10CC   Final    Culture  Setup Time [id number]   Final    Culture NO GROWTH 5 DAYS   Final    Report Status 11/21/2011 FINAL   Final   MRSA PCR SCREENING     Status: Normal   Collection Time   11/19/11 10:40 AM      Component Value Range Status Comment   MRSA by PCR NEGATIVE  NEGATIVE  Final   CULTURE, BLOOD (ROUTINE X 2)  Status: Normal (Preliminary result)   Collection Time   11/21/11  1:30 PM      Component Value Range Status Comment   Specimen Description BLOOD RIGHT ARM   Final    Special Requests BOTTLES DRAWN AEROBIC ONLY 5CC   Final    Culture  Setup Time [id number]   Final    Culture     Final    Value:        BLOOD CULTURE RECEIVED NO GROWTH TO DATE CULTURE WILL BE HELD FOR 5 DAYS BEFORE ISSUING A FINAL NEGATIVE REPORT   Report Status PENDING   Incomplete   CULTURE, BLOOD (ROUTINE X 2)     Status: Normal (Preliminary result)   Collection Time   11/21/11  4:28 PM      Component Value Range Status Comment   Specimen Description  BLOOD RIGHT HAND   Final    Special Requests BOTTLES DRAWN AEROBIC ONLY [facility]   Final    Culture  Setup Time [id number]   Final    Culture     Final    Value:        BLOOD CULTURE RECEIVED NO GROWTH TO DATE CULTURE WILL BE HELD FOR 5 DAYS BEFORE ISSUING A FINAL NEGATIVE REPORT   Report Status PENDING   Incomplete     Studies/Results: Dg Chest Port 1 View  11/24/2011  *RADIOLOGY REPORT*  Clinical Data: Follow up pulmonary edema.  PORTABLE CHEST - 1 VIEW  Comparison: 11/22/2011  Findings: Cardiomediastinal silhouette is stable.  Persistent mild perihilar interstitial prominence consistent with mild interstitial edema.  Worsening right basilar atelectasis or infiltrate.  Stable left basilar atelectasis or infiltrate.  IMPRESSION:  Persistent mild perihilar interstitial prominence consistent with mild interstitial edema.  Worsening right basilar atelectasis or infiltrate.  Stable left basilar atelectasis or infiltrate.  Original Report Authenticated By: Natasha Mead, M.D.    Medications: Scheduled Meds:    . antiseptic oral rinse  15 mL Mouth Rinse BID  . digoxin  0.25 mg Intravenous Once   Followed by  . digoxin  0.125 mg Intravenous Once   Followed by  . digoxin  0.125 mg Oral Daily  . enoxaparin (LOVENOX) injection  40 mg Subcutaneous Q24H  . imipenem-cilastatin  250 mg Intravenous Q6H  . insulin aspart  0-9 Units Subcutaneous TID WC  . latanoprost  1 drop Both Eyes QHS  . lisinopril  5 mg Oral Daily  . metoprolol tartrate  100 mg Oral BID  . metoprolol tartrate  50 mg Oral Once  . mulitivitamin with minerals  1 tablet Oral Daily  . pantoprazole  40 mg Oral Q1200  . simvastatin  40 mg Oral QPM  . DISCONTD: diltiazem  30 mg Oral Q6H  . DISCONTD: furosemide  20 mg Oral Daily  . DISCONTD: imipenem-cilastatin  500 mg Intravenous Q8H  . DISCONTD: metoprolol  5 mg Intravenous Q6H  . DISCONTD: metoprolol tartrate  50 mg Oral BID  . DISCONTD: potassium chloride  40 mEq Oral BID    Continuous Infusions:    . diltiazem (CARDIZEM) infusion 5 mg/hr (11/24/11 1200)  . DISCONTD: diltiazem (CARDIZEM) infusion 5 mg/hr (11/23/11 1500)  . DISCONTD: diltiazem (CARDIZEM) infusion 5 mg/hr (11/23/11 1700)  . DISCONTD: diltiazem (CARDIZEM) infusion 10 mg/hr (11/23/11 1845)  . DISCONTD: diltiazem (CARDIZEM) infusion 5 mg/hr (11/24/11 0800)   PRN Meds:.acetaminophen, ALPRAZolam, diphenhydrAMINE, diphenhydrAMINE, HYDROmorphone (DILAUDID) injection, hyoscyamine, ondansetron (ZOFRAN) IV, ondansetron, sodium chloride, DISCONTD: metoprolol  Assessment/Plan:  Principal Problem:  *Pancreatitis Active  Problems:  UTI (lower urinary tract infection)  DM (diabetes mellitus)  HTN (hypertension)  Hypercholesteremia  Glaucoma (increased eye pressure)  Hyponatremia  Fever  Leukocytosis  HCAP (healthcare-associated pneumonia)  1. Fever, Leukcytosis:  *Patient decompensated on 2/14 AM with A-Fib, Fever, worsening abdominal pain.  *CT scan 2/14 unchanged, no worse but no improvement either, significant pancreatitis, Dr.  Elnoria Howard following post ERCP.  *CXR showed infiltrates vs. Edema -she responded very well to IV lasix (early in the hospitalization received significant volume resuscitation because of pancreatitis) *WBC still increasing infection vs. demarg/stress note fevers and blood cultures have been negative so far be chest x-ray shows no evidence of pneumonia *CONTINUE EMPIRIC ANTIBIOTICS (primaxin) FOR PNA and POST-ERCP PANCREATITIS?EARLY INFECTION   2. A-Fib, probably paroxysmal, although seems to be persistent at this point, setting of acute pancreatitis:  *Rate well controlled on low-dose IV Cardizem but recently has not been able to be completely weaned off due to recurrent RVR *Will load with digoxin and begin to wean IV Cardizem *Continue oral Lopressor *CHADS2 risk score is 3 so she will likely need anticoagulation if this persists either with rivaroxiban or warfarin. We will  hold anti-coag in acute setting given her pancreatitis.  3. Pulmonary Infiltrate/Pulmonary edema:  Clinically less Volume overloaded based on recent x-ray and clinical exam *with fever and leukocytosis will have to presume that this is HCAP so will continue Primaxin Day #4  *DC Lasix    4. Pancreatitis s/p ERCP  *Tolerating diet Ativan *Air management per GI/Dr. Elnoria Howard  4. Vaginal Yeast Infection:  *Secondary to antibiotic use. Will treat with oral Fluconazole.  5. Hyponatremia *Likely related to volume shifting as well as recent use of Lasix *Sodium trends back up with discontinuation of Lasix and chronic hyponatremia related to volume depletion *If sodium decreases with discontinuation of Lasix patient still may be intravascularly volume overloaded and may require SIDH labs to help clarify *Since concerned over volume depletion Will ask nurse to check orthostatic vital signs  6. Deconditioning *Continue PT and OT evaluations and mobilize as tolerated *Note has issues with RVR with mobilizing  7. Disposition *Remain in stepdown *Likely will need skilled nursing facility at discharge when medically stable   LOS: 12 days   ELLIS,ALLISON L., ANP [facility]  11/24/2011, 1:26 PM  I have examined the patient and review the chart. I have discussed the plan with Junious Silk, NP and I agree with the above note.   Calvert Cantor, MD [phone]

## 2011-11-24 NOTE — Progress Notes (Signed)
Physical Therapy Treatment Patient Details Name: [name] [name] MRN: [id number] DOB: April 23, 1930 Today's Date: 11/24/2011  PT Assessment/Plan Comments on Treatment Session: Pt had been up in chair for several hours on arrival and wanted to lie down.  Agreeable to PT.  Assisted with transfer training for chair to [name] [name]. Pt encouraged to decide when she needed to turn around to return to her room when ambulating and pt with poor judgement (although very motivated). PT Plan: Discharge plan remains appropriate;Frequency remains appropriate PT Frequency: Min 3X/week Recommendations for Other Services: OT consult Follow Up Recommendations: Home health PT;Supervision - Intermittent; If continued slow progress, consider Inpatient Rehab consult Equipment Recommended: Other (comment) (TBA) PT Goals  Acute Rehab PT Goals PT Goal: Sit to Supine/Side - Progress: Progressing toward goal PT Goal: Sit to Stand - Progress: Progressing toward goal PT Goal: Stand to Sit - Progress: Progressing toward goal PT Goal: Ambulate - Progress: Progressing toward goal  PT Treatment Precautions/Restrictions  Precautions Precautions: Fall Required Braces or Orthoses: No Restrictions Weight Bearing Restrictions: No Other Position/Activity Restrictions: desaturates Mobility (including Balance) Bed Mobility Sit to Supine: 3: Mod assist;HOB flat Sit to Supine - Details (indicate cue type and reason): assist to raise legs onto bed; cues for sequencing with attempt to get pt to pass through sidelying prior to supine Transfers Sit to Stand: 5: Supervision;With upper extremity assist;From chair/3-in-1 (x3 trials) Sit to Stand Details (indicate cue type and reason): no cues for safe hand placement; minguard assist for safety Stand to Sit: 4: Min assist;With armrests;To chair/3-in-1;To bed (x3) Stand to Sit Details: cues for safe hand placement (on armrests); minguard assist for safety Ambulation/Gait Ambulation/Gait:  Yes Ambulation/Gait Assistance: 4: Min assist (with 02 and monitor) Ambulation/Gait Assistance Details (indicate cue type and reason): Pt's RW is too tall and therefore she pushes it too far ahead of her body with tendency to then flex trunk; became too tired to complete walk back to room and pt assisted to sit in chair and rolled back to room. Ambulation Distance (Feet): 13 Feet Assistive device: Rolling walker Gait Pattern: Decreased stride length;Step-to pattern;Trunk flexed    Exercise    End of Session PT - End of Session Equipment Utilized During Treatment: Gait belt Activity Tolerance: Patient limited by fatigue (slight decr in BP on standing (not orthostatic); "weak back") Patient left: in bed;with call bell in reach;with family/visitor present Nurse Communication: Mobility status for transfers;Mobility status for ambulation General Behavior During Session: [name] [name] for tasks performed Cognition: [name] [name] for tasks performed  [name] [name] 11/24/2011, 12:58 PM Pager [phone]

## 2011-11-25 LAB — CBC
MCH: 29 pg (ref 26.0–34.0)
MCV: 88.9 fL (ref 78.0–100.0)
Platelets: 646 10*3/uL — ABNORMAL HIGH (ref 150–400)
RBC: 4.07 MIL/uL (ref 3.87–5.11)

## 2011-11-25 LAB — GLUCOSE, CAPILLARY
Glucose-Capillary: 163 mg/dL — ABNORMAL HIGH (ref 70–99)
Glucose-Capillary: 183 mg/dL — ABNORMAL HIGH (ref 70–99)

## 2011-11-25 LAB — RENAL FUNCTION PANEL
Albumin: 2.4 g/dL — ABNORMAL LOW (ref 3.5–5.2)
BUN: 14 mg/dL (ref 6–23)
Chloride: 96 mEq/L (ref 96–112)
Creatinine, Ser: 0.9 mg/dL (ref 0.50–1.10)
Phosphorus: 3.8 mg/dL (ref 2.3–4.6)

## 2011-11-25 MED ORDER — DIGOXIN 0.0625 MG HALF TABLET
0.0625 mg | ORAL_TABLET | Freq: Every day | ORAL | Status: DC
  Filled 2011-11-25: qty 1

## 2011-11-25 MED ORDER — METOPROLOL TARTRATE 1 MG/ML IV SOLN
INTRAVENOUS | Status: AC
  Administered 2011-11-25: 5 mg via INTRAVENOUS
  Filled 2011-11-25: qty 5

## 2011-11-25 MED ORDER — METOPROLOL TARTRATE 1 MG/ML IV SOLN
5.0000 mg | INTRAVENOUS | Status: DC | PRN
  Administered 2011-11-25 – 2011-11-27 (×3): 5 mg via INTRAVENOUS
  Filled 2011-11-25 (×3): qty 5

## 2011-11-25 NOTE — Progress Notes (Signed)
CSW received referral for potential disposition needs. CSW reviewed pt chart and at this time, physical therapy has recommended home health pt with 24 hour supervision. CSW to discuss further regarding pt disposition needs with rn case Production designer, theatre/television/film. .Clinical social worker continuing to follow pt to assist with pt dc plans and further csw needs.    Catha Gosselin, Theresia Majors  [phone] .11/25/2011 15:33pm

## 2011-11-25 NOTE — Progress Notes (Signed)
Physical Therapy Treatment Patient Details Name: [name] [name] MRN: [id number] DOB: September 23, 1930 Today's Date: 11/25/2011  PT Assessment/Plan Comments on Treatment Session: Pt required more assist today due to decr cognitive status (slightly confused).  She tolerated ambulation better compared to 02/19 (on RA and no seated rest period in hallway) PT Plan: Discharge plan remains appropriate;Frequency remains appropriate PT Frequency: Min 3X/week Recommendations for Other Services: OT consult Follow Up Recommendations: Home health PT;Supervision/Assistance - 24 hour Equipment Recommended: Other (comment) (TBA) PT Goals  Acute Rehab PT Goals PT Goal Formulation: With patient Time For Goal Achievement: 2 weeks Pt will go Supine/Side to Sit: with supervision PT Goal: Supine/Side to Sit - Progress: Goal set today Pt will go Sit to Supine/Side: with supervision PT Goal: Sit to Supine/Side - Progress: Goal set today Pt will go Sit to Stand: with supervision;with upper extremity assist PT Goal: Sit to Stand - Progress: Goal set today Pt will go Stand to Sit: with supervision;with upper extremity assist PT Goal: Stand to Sit - Progress: Goal set today Pt will Ambulate: 51 - 150 feet;with supervision;with least restrictive assistive device PT Goal: Ambulate - Progress: Goal set today Pt will Perform Home Exercise Program: with supervision, verbal cues required/provided  PT Treatment Precautions/Restrictions  Precautions Precautions: Fall Required Braces or Orthoses: No Restrictions Weight Bearing Restrictions: No Other Position/Activity Restrictions: desaturates Mobility (including Balance) Bed Mobility Supine to Sit: 4: Min assist Supine to Sit Details (indicate cue type and reason): pt slightly confused with slow processing; awakened on arrival; assist to initiate movements Sitting - Scoot to Edge of Bed: 5: Supervision;With rail Transfers Sit to Stand: 4: Min assist;With upper  extremity assist;From bed Sit to Stand Details (indicate cue type and reason): minguard assist due to slight confusion (? med related--pt reports taking something last night to help her sleep, and has slept all morning per RN) Stand to Sit: 4: Min assist;To chair/3-in-1;With upper extremity assist Stand to Sit Details: minguard assist for safety Ambulation/Gait Ambulation/Gait Assistance: 4: Min assist Ambulation/Gait Assistance Details (indicate cue type and reason): completed one short walk in room to assess SaO2 on RA and leg strength/endurance; seated rest and then ambulated into hallway Ambulation Distance (Feet): 52 Feet (62ft, seated rest, then walked 45 ft) Assistive device: Rolling walker Gait Pattern: Step-through pattern;Decreased stride length;Trunk flexed    Exercise    End of Session PT - End of Session Equipment Utilized During Treatment: Gait belt Activity Tolerance: Patient limited by fatigue;Other (comment) Patient left: in chair;with call bell in reach Nurse Communication: Mobility status for transfers;Mobility status for ambulation General Behavior During Session: [facility] for tasks performed Cognition: Impaired (slightly confused on awakening (knew [facility]; not day))  Brentley Landfair 11/25/2011, 2:51 PM Pager [phone]

## 2011-11-25 NOTE — Progress Notes (Signed)
Patient ID: [name] [name], female   DOB: 1929/11/25, 78 y.o.   MRN: [id number] Subjective: The patient reports feeling rough.  No chest pain, SOB, or worsening abdominal pain.  Objective: Vital signs in last 24 hours: Temp:  [98.1 F (36.7 C)-98.9 F (37.2 C)] 98.5 F (36.9 C) (02/20 0736) Pulse Rate:  [78-124] 97  (02/20 0929) Resp:  [13-20] 15  (02/20 0736) BP: (105-135)/(45-90) 114/59 mmHg (02/20 0929) SpO2:  [92 %-97 %] 92 % (02/20 0736) Weight:  [59.5 kg (131 lb 2.8 oz)] 59.5 kg (131 lb 2.8 oz) (02/20 0500) Last BM Date: 11/24/11  Intake/Output from previous day: 02/19 0701 - 02/20 0700 In: 2199 [P.O.:720; I.V.:35; IV Piggyback:414] Out: 700 [Urine:700] Intake/Output this shift: Total I/O In: 300 [Other:300] Out: -   General appearance: alert and weak appearing GI: soft, non-tender; bowel sounds normal; no masses,  no organomegaly  Lab Results:  Basename 11/25/11 0340 11/24/11 0505 11/22/11 1023  WBC 18.6* 19.4* 21.9*  HGB 11.8* 10.8* 11.6*  HCT 36.2 33.7* 34.3*  PLT 646* 643* 558*   BMET  Basename 11/25/11 0340 11/24/11 0505 11/22/11 1023  NA 133* 131* 131*  K 4.4 6.0* 4.8  CL 96 96 94*  CO2 26 27 26   GLUCOSE 159* 189* 253*  BUN 14 14 12   CREATININE 0.90 0.95 0.83  CALCIUM 9.9 9.8 9.6   LFT  Basename 11/25/11 0340  PROT --  ALBUMIN 2.4*  AST --  ALT --  ALKPHOS --  BILITOT --  BILIDIR --  IBILI --   PT/INR No results found for this basename: LABPROT:2,INR:2 in the last 72 hours Hepatitis Panel No results found for this basename: HEPBSAG,HCVAB,HEPAIGM,HEPBIGM in the last 72 hours C-Diff No results found for this basename: CDIFFTOX:3 in the last 72 hours Fecal Lactopherrin No results found for this basename: FECLLACTOFRN in the last 72 hours  Studies/Results: Dg Chest Port 1 View  11/24/2011  *RADIOLOGY REPORT*  Clinical Data: Follow up pulmonary edema.  PORTABLE CHEST - 1 VIEW  Comparison: 11/22/2011  Findings: Cardiomediastinal  silhouette is stable.  Persistent mild perihilar interstitial prominence consistent with mild interstitial edema.  Worsening right basilar atelectasis or infiltrate.  Stable left basilar atelectasis or infiltrate.  IMPRESSION:  Persistent mild perihilar interstitial prominence consistent with mild interstitial edema.  Worsening right basilar atelectasis or infiltrate.  Stable left basilar atelectasis or infiltrate.  Original Report Authenticated By: Natasha Mead, M.D.    Medications:  Scheduled:   . antiseptic oral rinse  15 mL Mouth Rinse BID  . digoxin  0.25 mg Intravenous Once   Followed by  . digoxin  0.125 mg Intravenous Once   Followed by  . digoxin  0.125 mg Oral Daily  . enoxaparin (LOVENOX) injection  40 mg Subcutaneous Q24H  . imipenem-cilastatin  250 mg Intravenous Q6H  . insulin aspart  0-9 Units Subcutaneous TID WC  . latanoprost  1 drop Both Eyes QHS  . lisinopril  5 mg Oral Daily  . metoprolol tartrate  100 mg Oral BID  . mulitivitamin with minerals  1 tablet Oral Daily  . pantoprazole  40 mg Oral Q1200  . simvastatin  40 mg Oral QPM   Continuous:   . diltiazem (CARDIZEM) infusion Stopped (11/24/11 1410)    Assessment/Plan: 1) Severe Post ERCP pancreatitis 2) Atrial fibrillation   She appears to be weaker today, but I cannot discern the cause.  Her vital signs are stable and there is no increase in her WBC.  No fever.  From the GI standpoint she appears to be stable.  Plan: 1) Supportive care. 2) Continue with the bland diet.  LOS: 13 days   [name] [name] D 11/25/2011, 10:07 AM

## 2011-11-25 NOTE — Progress Notes (Signed)
Subjective: Awakened from sleep. Initially somewhat agitated but then became more appropriate. Ask questions regarding why she was in the hospital. Did not remember going to another facility before being transferred to [facility] but was aware she was in [facility] without prompting. States is able to eat small amounts of food without nausea vomiting or abdominal pain. Wanted [place] to contact her husband to make sure he was updated on her condition.  Objective: Vital signs in last 24 hours: Temp:  [97.8 F (36.6 C)-98.9 F (37.2 C)] 97.8 F (36.6 C) (02/20 1134) Pulse Rate:  [60-124] 60  (02/20 1134) Resp:  [14-20] 15  (02/20 1134) BP: (107-135)/(45-67) 113/64 mmHg (02/20 1134) SpO2:  [92 %-98 %] 98 % (02/20 1134) Weight:  [59.5 kg (131 lb 2.8 oz)] 59.5 kg (131 lb 2.8 oz) (02/20 0500) Weight change: -3.1 kg (-6 lb 13.4 oz) Last BM Date: 11/24/11  Intake/Output from previous day: 02/19 0701 - 02/20 0700 In: 2199 [P.O.:720; I.V.:35; IV Piggyback:414] Out: 700 [Urine:700] Total I/O In: 780 [P.O.:480; Other:300] Out: 350 [Urine:350]   Physical Exam: General: Alert, awake, oriented x3, in no acute distress. HEENT: No bruits, no goiter. Heart: IRIR, without murmurs, rubs, gallops. Lungs: Clear to auscultation bilaterally. Abdomen: Soft, nontender, nondistended, positive bowel sounds. Extremities: No clubbing cyanosis or edema with positive pedal pulses. Neuro: Grossly intact, nonfocal. Seems to have some mild short-term memory deficits.  Lab Results: Basic Metabolic Panel:  Basename 11/25/11 0340 11/24/11 0505  NA 133* 131*  K 4.4 6.0*  CL 96 96  CO2 26 27  GLUCOSE 159* 189*  BUN 14 14  CREATININE 0.90 0.95  CALCIUM 9.9 9.8  MG -- --  PHOS 3.8 --   CBC:  Basename 11/25/11 0340 11/24/11 0505  WBC 18.6* 19.4*  NEUTROABS -- --  HGB 11.8* 10.8*  HCT 36.2 33.7*  MCV 88.9 90.6  PLT 646* 643*   CBG:  Basename 11/25/11 1218 11/25/11 0823 11/24/11 2137 11/24/11 1741  11/24/11 1230 11/24/11 0732  GLUCAP 149* 168* 166* 187* 167* 163*    Recent Results (from the past 240 hour(s))  MRSA PCR SCREENING     Status: Normal   Collection Time   11/19/11 10:40 AM      Component Value Range Status Comment   MRSA by PCR NEGATIVE  NEGATIVE  Final   CULTURE, BLOOD (ROUTINE X 2)     Status: Normal (Preliminary result)   Collection Time   11/21/11  1:30 PM      Component Value Range Status Comment   Specimen Description BLOOD RIGHT ARM   Final    Special Requests BOTTLES DRAWN AEROBIC ONLY 5CC   Final    Culture  Setup Time [id number]   Final    Culture     Final    Value:        BLOOD CULTURE RECEIVED NO GROWTH TO DATE CULTURE WILL BE HELD FOR 5 DAYS BEFORE ISSUING A FINAL NEGATIVE REPORT   Report Status PENDING   Incomplete   CULTURE, BLOOD (ROUTINE X 2)     Status: Normal (Preliminary result)   Collection Time   11/21/11  4:28 PM      Component Value Range Status Comment   Specimen Description BLOOD RIGHT HAND   Final    Special Requests BOTTLES DRAWN AEROBIC ONLY [facility]   Final    Culture  Setup Time [id number]   Final    Culture     Final    Value:  BLOOD CULTURE RECEIVED NO GROWTH TO DATE CULTURE WILL BE HELD FOR 5 DAYS BEFORE ISSUING A FINAL NEGATIVE REPORT   Report Status PENDING   Incomplete     Assessment/Plan:  1. Fever, Leukcytosis:  *Patient decompensated on 2/14 AM with A-Fib, Fever, worsening abdominal pain.  *CT scan 2/14 unchanged, no worse but no improvement either, significant pancreatitis, Dr.  Elnoria Howard following post ERCP.  *CXR showed infiltrates vs. Edema -she responded very well to IV lasix (early in the hospitalization received significant volume resuscitation because of pancreatitis) *WBC still increasing infection vs. demarg/stress note fevers and blood cultures have been negative so far be chest x-ray shows no evidence of pneumonia *CONTINUE EMPIRIC ANTIBIOTICS (primaxin) FOR PNA and POST-ERCP PANCREATITIS?EARLY INFECTION  *  will dc foley today 11/25/2011  2. A-Fib, probably paroxysmal, although seems to be persistent at this point, setting of acute pancreatitis:  *Has been loaded with digoxin and weaned off of IV Cardizem that ventricular rates between 60 and 90 *Continue oral Lopressor *CHADS2 risk score is 3 so she will likely need anticoagulation if this persists either with rivaroxiban or warfarin.  *We will hold anti-coag in acute setting given her pancreatitis. Would like GI to comment on whether appropriate to start anticoagulation at this point  3. Pulmonary Infiltrate/Pulmonary edema:  *Clinically less Volume overloaded based on recent x-ray and clinical exam *Present with fever and leukocytosis will have to presume that this is HCAP so will continue Primaxin Day #5 *Lasix was discontinued 11/24/2011    4. Pancreatitis s/p ERCP  *Tolerating diet advance but with marginal intake *Primary management of pancreatitis per GI/Dr. Elnoria Howard  4. Vaginal Yeast Infection:  *Secondary to antibiotic use. Will treat with oral Fluconazole.  5. Hyponatremia *Likely related to volume shifting as well as recent use of Lasix *Sodium continues to trend back up after discontinuation of Lasix to be lead recent hyponatremia related to volume depletion *No evidence of orthostasis with orthostatic vital signs checked  6. Deconditioning *Continue PT and OT evaluations and mobilize as tolerated *Note has issues with RVR with mobilizing  7. Disposition *Transferred to telemetry floor  8. family communication *Met with husband in room. Would like to take pt home- has a good deal of DME at home- raised toilet, adjustable bed, etc. Likely will need HH PT/OT/aide   LOS: 13 days   ELLIS,ALLISON L., ANP [facility]  11/25/2011, 1:25 PM  I have personally examined this patient and reviewed the entire database. I have reviewed the above note, made any necessary editorial changes, and agree with its content.  Lonia Blood, MD [facility]

## 2011-11-25 NOTE — Progress Notes (Signed)
SPOKE WITH THE PT AND HER FAMILY AND THEY STATED THAT THEY WILL BE ABLE TO CARE FOR HER AT HOME WITH 24 HR SUPERVISION.  PT HAS RECOMMEDED HH PT AT THIS TIME AND THEY ARE IN AGREEMENT WITH THAT.  WILL F/U ON AGENCY ONCE OT HAS EVAL PT.  WILL F/U.   [name] [name] 11/25/2011 [phone] OR [phone]

## 2011-11-26 DIAGNOSIS — E876 Hypokalemia: Secondary | ICD-10-CM | POA: Diagnosis present

## 2011-11-26 LAB — GLUCOSE, CAPILLARY
Glucose-Capillary: 152 mg/dL — ABNORMAL HIGH (ref 70–99)
Glucose-Capillary: 198 mg/dL — ABNORMAL HIGH (ref 70–99)
Glucose-Capillary: 216 mg/dL — ABNORMAL HIGH (ref 70–99)

## 2011-11-26 LAB — COMPREHENSIVE METABOLIC PANEL
ALT: 14 U/L (ref 0–35)
AST: 29 U/L (ref 0–37)
Calcium: 9.5 mg/dL (ref 8.4–10.5)
Creatinine, Ser: 0.93 mg/dL (ref 0.50–1.10)
Sodium: 139 mEq/L (ref 135–145)
Total Protein: 6.9 g/dL (ref 6.0–8.3)

## 2011-11-26 LAB — CBC
HCT: 33.5 % — ABNORMAL LOW (ref 36.0–46.0)
Hemoglobin: 10.9 g/dL — ABNORMAL LOW (ref 12.0–15.0)
MCH: 29.1 pg (ref 26.0–34.0)
MCHC: 32.5 g/dL (ref 30.0–36.0)
RBC: 3.75 MIL/uL — ABNORMAL LOW (ref 3.87–5.11)

## 2011-11-26 LAB — PROTIME-INR: Prothrombin Time: 16.4 seconds — ABNORMAL HIGH (ref 11.6–15.2)

## 2011-11-26 MED ORDER — POTASSIUM CHLORIDE CRYS ER 20 MEQ PO TBCR
40.0000 meq | EXTENDED_RELEASE_TABLET | Freq: Once | ORAL | Status: AC
  Administered 2011-11-26: 40 meq via ORAL
  Filled 2011-11-26: qty 2

## 2011-11-26 MED ORDER — METOPROLOL TARTRATE 50 MG PO TABS
50.0000 mg | ORAL_TABLET | Freq: Two times a day (BID) | ORAL | Status: DC
  Administered 2011-11-26 – 2011-11-30 (×9): 50 mg via ORAL
  Filled 2011-11-26 (×10): qty 1

## 2011-11-26 MED ORDER — BOOST / RESOURCE BREEZE PO LIQD
1.0000 | Freq: Two times a day (BID) | ORAL | Status: DC
  Administered 2011-11-26 – 2011-11-30 (×8): 1 via ORAL

## 2011-11-26 MED ORDER — COUMADIN BOOK
Freq: Once | Status: AC
  Administered 2011-11-26: 11:00:00
  Filled 2011-11-26: qty 1

## 2011-11-26 MED ORDER — DIGOXIN 125 MCG PO TABS
0.1250 mg | ORAL_TABLET | Freq: Every day | ORAL | Status: DC
  Administered 2011-11-26 – 2011-11-30 (×5): 0.125 mg via ORAL
  Filled 2011-11-26 (×5): qty 1

## 2011-11-26 MED ORDER — DILTIAZEM HCL ER COATED BEADS 120 MG PO CP24
120.0000 mg | ORAL_CAPSULE | Freq: Every day | ORAL | Status: DC
  Administered 2011-11-26 – 2011-11-30 (×5): 120 mg via ORAL
  Filled 2011-11-26 (×5): qty 1

## 2011-11-26 MED ORDER — WARFARIN SODIUM 4 MG PO TABS
4.0000 mg | ORAL_TABLET | Freq: Once | ORAL | Status: AC
  Administered 2011-11-26: 4 mg via ORAL
  Filled 2011-11-26 (×2): qty 1

## 2011-11-26 MED ORDER — WARFARIN VIDEO
Freq: Once | Status: AC
  Administered 2011-11-26: 10:00:00

## 2011-11-26 NOTE — Progress Notes (Signed)
Pt transferred to 4731 bed 2 via w/c with belongings and husband report called to rn. Beryle Quant

## 2011-11-26 NOTE — Progress Notes (Signed)
11/26/11 1745 Nursing Note: Pt arrived to room 4731-02 in AFIB with a heart rate of 120's-150's. Pt is stable and asymptomatic. MD made aware. No orders received. Pt has PRN Metoprolol IV push for increased heart rate, will give as directed. Will continue to monitor pt. Merilyn Pagan Scientist, clinical (histocompatibility and immunogenetics).

## 2011-11-26 NOTE — Progress Notes (Signed)
Patient ID: [name] [name], female   DOB: 26-Apr-1930, 78 y.o.   MRN: [id number] Subjective: Feels well today.  She is not sure why she was confused yesterday, but she does recall the events.  Objective: Vital signs in last 24 hours: Temp:  [97.8 F (36.6 C)-98.5 F (36.9 C)] 97.8 F (36.6 C) (02/21 0400) Pulse Rate:  [60-126] 126  (02/20 1848) Resp:  [15-21] 21  (02/20 1848) BP: (107-120)/(45-64) 115/53 mmHg (02/21 0400) SpO2:  [92 %-98 %] 98 % (02/20 1848) Weight:  [59.7 kg (131 lb 9.8 oz)] 59.7 kg (131 lb 9.8 oz) (02/21 0400) Last BM Date: 11/24/11  Intake/Output from previous day: 02/20 0701 - 02/21 0700 In: 1100 [P.O.:600; IV Piggyback:200] Out: 1050 [Urine:1050] Intake/Output this shift: Total I/O In: -  Out: 400 [Urine:400]  General appearance: alert and no distress GI: soft, non-tender; bowel sounds normal; no masses,  no organomegaly  Lab Results:  Basename 11/26/11 0452 11/25/11 0340 11/24/11 0505  WBC 15.8* 18.6* 19.4*  HGB 10.9* 11.8* 10.8*  HCT 33.5* 36.2 33.7*  PLT 621* 646* 643*   BMET  Basename 11/25/11 0340 11/24/11 0505  NA 133* 131*  K 4.4 6.0*  CL 96 96  CO2 26 27  GLUCOSE 159* 189*  BUN 14 14  CREATININE 0.90 0.95  CALCIUM 9.9 9.8   LFT  Basename 11/25/11 0340  PROT --  ALBUMIN 2.4*  AST --  ALT --  ALKPHOS --  BILITOT --  BILIDIR --  IBILI --   PT/INR No results found for this basename: LABPROT:2,INR:2 in the last 72 hours Hepatitis Panel No results found for this basename: HEPBSAG,HCVAB,HEPAIGM,HEPBIGM in the last 72 hours C-Diff No results found for this basename: CDIFFTOX:3 in the last 72 hours Fecal Lactopherrin No results found for this basename: FECLLACTOFRN in the last 72 hours  Studies/Results: Dg Chest Port 1 View  11/24/2011  *RADIOLOGY REPORT*  Clinical Data: Follow up pulmonary edema.  PORTABLE CHEST - 1 VIEW  Comparison: 11/22/2011  Findings: Cardiomediastinal silhouette is stable.  Persistent mild perihilar  interstitial prominence consistent with mild interstitial edema.  Worsening right basilar atelectasis or infiltrate.  Stable left basilar atelectasis or infiltrate.  IMPRESSION:  Persistent mild perihilar interstitial prominence consistent with mild interstitial edema.  Worsening right basilar atelectasis or infiltrate.  Stable left basilar atelectasis or infiltrate.  Original Report Authenticated By: Natasha Mead, M.D.    Medications:  Scheduled:   . antiseptic oral rinse  15 mL Mouth Rinse BID  . digoxin  0.0625 mg Oral Daily  . enoxaparin (LOVENOX) injection  40 mg Subcutaneous Q24H  . imipenem-cilastatin  250 mg Intravenous Q6H  . insulin aspart  0-9 Units Subcutaneous TID WC  . latanoprost  1 drop Both Eyes QHS  . lisinopril  5 mg Oral Daily  . metoprolol tartrate  100 mg Oral BID  . mulitivitamin with minerals  1 tablet Oral Daily  . pantoprazole  40 mg Oral Q1200  . simvastatin  40 mg Oral QPM  . DISCONTD: digoxin  0.125 mg Oral Daily   Continuous:   Assessment/Plan: 1) Severe Post ERCP pancreatitis  2) Atrial fibrillation   The patient feels better today.  From the GI standpoint I think she has almost recovered from the pancreatitis.    Plan:  1) Supportive care.  2) Continue with the bland diet and advance as tolerated.   LOS: 14 days   Sharlee Rufino D 11/26/2011, 6:29 AM

## 2011-11-26 NOTE — Progress Notes (Addendum)
ANTICOAGULATION CONSULT NOTE - Initial Consult  Pharmacy Consult for Coumadin Indication: atrial fibrillation  Allergies  Allergen Reactions  . Lactose Intolerance (Gi)     Nausea and vomiting, severe stomach pain  . Penicillins Hives and Itching  . Sulfa Antibiotics Hives and Itching    Patient Measurements: Height: 5' (152.4 cm) Weight: 131 lb 9.8 oz (59.7 kg) IBW/kg (Calculated) : 45.5   Vital Signs: Temp: 98.3 F (36.8 C) (02/21 0713) Temp src: Oral (02/21 0713) BP: 127/61 mmHg (02/21 0713) Pulse Rate: 91  (02/21 0713)  Labs:  Basename 11/26/11 0452 11/25/11 0340 11/24/11 0505  HGB 10.9* 11.8* --  HCT 33.5* 36.2 33.7*  PLT 621* 646* 643*  APTT -- -- --  LABPROT -- -- --  INR -- -- --  HEPARINUNFRC -- -- --  CREATININE 0.93 0.90 0.95  CKTOTAL -- -- --  CKMB -- -- --  TROPONINI -- -- --   Estimated Creatinine Clearance: 39.6 ml/min (by C-G formula based on Cr of 0.93).  Medical History: Past Medical History  Diagnosis Date  . Pancreatitis   . Hypertension   . Diabetes mellitus   . Glaucoma   . High cholesterol   . GERD (gastroesophageal reflux disease)   . H/O hiatal hernia     Medications:  Prescriptions prior to admission  Medication Sig Dispense Refill  . acetaminophen (TYLENOL) 500 MG tablet Take 500 mg by mouth every 6 (six) hours as needed. Patient used this medication for pain.      Marland Kitchen ALPRAZolam (XANAX) 1 MG tablet Take 1 mg by mouth at bedtime as needed.      . fenofibrate 160 MG tablet Take 160 mg by mouth daily.      Marland Kitchen glimepiride (AMARYL) 4 MG tablet Take 4 mg by mouth daily before breakfast.      . hyoscyamine (ANASPAZ) 0.125 MG TBDP Place under the tongue.      Marland Kitchen LACTASE PO Take 1 tablet by mouth daily.      . Latanoprost (XALATAN OP) Apply 1 drop to eye once. Patient uses this medication in each eye at night.      Marland Kitchen lisinopril (PRINIVIL,ZESTRIL) 5 MG tablet Take 5 mg by mouth daily.      . methylphenidate (RITALIN) 5 MG tablet Take 5 mg  by mouth 2 (two) times daily.      . Multiple Vitamins-Minerals (CENTRUM SILVER PO) Take 1 tablet by mouth daily.      Marland Kitchen omeprazole (PRILOSEC) 20 MG capsule Take 20 mg by mouth daily.      . simvastatin (ZOCOR) 40 MG tablet Take 40 mg by mouth every evening.       Scheduled:    . antiseptic oral rinse  15 mL Mouth Rinse BID  . digoxin  0.125 mg Oral Daily  . diltiazem  120 mg Oral Daily  . enoxaparin (LOVENOX) injection  40 mg Subcutaneous Q24H  . imipenem-cilastatin  250 mg Intravenous Q6H  . insulin aspart  0-9 Units Subcutaneous TID WC  . latanoprost  1 drop Both Eyes QHS  . lisinopril  5 mg Oral Daily  . metoprolol tartrate  50 mg Oral BID  . mulitivitamin with minerals  1 tablet Oral Daily  . pantoprazole  40 mg Oral Q1200  . simvastatin  40 mg Oral QPM  . DISCONTD: digoxin  0.0625 mg Oral Daily  . DISCONTD: digoxin  0.125 mg Oral Daily  . DISCONTD: metoprolol tartrate  100 mg Oral BID  Assessment: 78 year old female s/p ERPC on 2/8 then decompensated 2/14 with AFib. AFib is persistent and anticoagulation to start as CHADS2 score is 3.   Anticoagulation: New start Coumadin for Afib, no baseline INR however patient has not been on coumadin in past, no vitamin K, LFTs wnl, SCr stable, Albumin 2.4. Will order baseline INR. Will start at low dose based on age and weight. Patient currently on Lovenox sq for DVT prophylaxis- will discontinue when INR >2.   Infectious Disease:  Persistent fevers despite Cipro for UTI, MD changing to Primaxin for UTI/ Pulmonary Infiltrate (HCAP)-- 2/16 BCx-ngtd. Primaxin day# 8/10. Afebrile, WBC trending down. SCr stable. No change needed. Follow-up stop date. Vaginal yeast infection on Fluconazole- Fluconazole has stopped.  Cardiovascular : Htn, new Afib- HR 82-114, BP stable. On Digoxin po, Diltiazem po, ACEI, Metoprolol, statin.  Endocrinology: DM, CBGs-145 to 187 (above goal), on SSI (Glimepiride PTA not yet resumed) Gastrointestinal / Nutrition:  Pancreatitis s/p ERCP on 2.8; Po bland diet, PPI po Neurology: AMS 2/20-improved this am, xanax prn-Patient attributing AMS to xanax (discontinue?), dilaudid prn Nephrology: SCr stable at 0.93/estCrCl~59ml/min, K 3.5- not supplemented, on digoxin. Paged NP- Giving 40 mEq today.  Pulmonary: RA Hematology / Oncology: H/H low, stable. Platelets elevated 621.  PTA Medication Issues: Glimepiride, fenofibrate not yet resumed Best Practices: Coumadin for Afib  Goal of Therapy:  INR 2-3   Plan:  1. Coumadin 4mg  po x1 today at 1800. 2. Follow-up baseline INR- will adjust if needed. 3. Daily PT/INR.   Fayne Norrie, PharmD, BCPS 11/26/2011,9:14 AM   Addendum: 11/26/2011, 2:31 PM Baseline INR 1.30- Dose reduced per protocol secondary to age and weight. Will continue with 4mg  po x1 tonight and follow-up PT/INR in AM.   Lonzo Cloud Gala Lewandowsky, PharmD, BCPS

## 2011-11-26 NOTE — Progress Notes (Signed)
11/26/11 1818 Nursing Note: Pt complaining of chest discomfort. Pt put on 2 liters of oxygen and states " the discomfort is gone". Blood pressure: 129/52, 100% 2 liters Gilbert and heart rate of 110- 120's. Pt is stable lying in bed with no complaints at this time. Sorina Derrig Scientist, clinical (histocompatibility and immunogenetics).

## 2011-11-26 NOTE — Progress Notes (Signed)
11/26/11 1650 Nursing Note: Pt stable upon arrival to room. Pt states no pain at this time. Cardiac monitor applied, pt is AFIB with a rate of 125. Pt is a fall risk and has been educated on importance of calling nurse before getting out of bed alone. IV within normal limits. Will continue to monitor pt. Beckham Capistran Scientist, clinical (histocompatibility and immunogenetics).

## 2011-11-26 NOTE — Progress Notes (Signed)
11/26/11 1832 Nursing Note: Pt"s heart rate ranged 120's-140's. IV metoprolol given per MD's order. Pt tolerated Lopressor injection well, with no complaints of chest pain or discomfort. Heart rate now high 80's- low 100's. Respirations even and unlabored. Vital signs within normal limits and call bell within reach .Will continue to monitor pt. [name] Hugh RN

## 2011-11-26 NOTE — Progress Notes (Signed)
INITIAL ADULT NUTRITION ASSESSMENT Date: 11/26/2011   Time: 3:20 PM  Reason for Assessment: poor PO intake  ASSESSMENT: Female 78 y.o.  Dx: Pancreatitis  Hx:  Past Medical History  Diagnosis Date  . Pancreatitis   . Hypertension   . Diabetes mellitus   . Glaucoma   . High cholesterol   . GERD (gastroesophageal reflux disease)   . H/O hiatal hernia     Related Meds:     . antiseptic oral rinse  15 mL Mouth Rinse BID  . coumadin book   Does not apply Once  . digoxin  0.125 mg Oral Daily  . diltiazem  120 mg Oral Daily  . enoxaparin (LOVENOX) injection  40 mg Subcutaneous Q24H  . imipenem-cilastatin  250 mg Intravenous Q6H  . insulin aspart  0-9 Units Subcutaneous TID WC  . latanoprost  1 drop Both Eyes QHS  . lisinopril  5 mg Oral Daily  . metoprolol tartrate  50 mg Oral BID  . mulitivitamin with minerals  1 tablet Oral Daily  . pantoprazole  40 mg Oral Q1200  . potassium chloride  40 mEq Oral Once  . simvastatin  40 mg Oral QPM  . warfarin  4 mg Oral ONCE-1800  . warfarin   Does not apply Once  . DISCONTD: digoxin  0.0625 mg Oral Daily  . DISCONTD: metoprolol tartrate  100 mg Oral BID    Ht: 5' (152.4 cm)  Wt: 131 lb 9.8 oz (59.7 kg)  Ideal Wt: 45.4 kg % Ideal Wt: 131%  Usual Wt: --- % Usual Wt: ---  Body mass index is 25.70 kg/(m^2).  Food/Nutrition Related Hx: nausea & vomiting PTA  Labs:  CMP     Component Value Date/Time   NA 139 11/26/2011 0452   K 3.5 11/26/2011 0452   CL 101 11/26/2011 0452   CO2 26 11/26/2011 0452   GLUCOSE 145* 11/26/2011 0452   BUN 17 11/26/2011 0452   CREATININE 0.93 11/26/2011 0452   CALCIUM 9.5 11/26/2011 0452   PROT 6.9 11/26/2011 0452   ALBUMIN 2.4* 11/26/2011 0452   AST 29 11/26/2011 0452   ALT 14 11/26/2011 0452   ALKPHOS 64 11/26/2011 0452   BILITOT 0.3 11/26/2011 0452   GFRNONAA 57* 11/26/2011 0452   GFRAA 66* 11/26/2011 0452    I/O last 3 completed shifts: In: 1634 [P.O.:720; Other:300; IV Piggyback:614] Out: 1050  [Urine:1050] Total I/O In: 340 [P.O.:240; IV Piggyback:100] Out: 200 [Urine:200]   CBG (last 3)   Basename 11/26/11 1237 11/26/11 0834 11/25/11 2143  GLUCAP 198* 152* 183*    Diet Order: Parke Simmers  Supplements/Tube Feeding: N/A  IVF: N/A  Estimated Nutritional Needs:   Kcal: 1700-1900 Protein: 80-90 gms Fluid: 1.7-1.9 L  RD obtain nutrition hx from pt -- N/V prior to admission due to pancreatitis; s/p ERCP 2/8; appetite is still not very good; PO intake ~ 25% per pt; she does not know if she's lost weight; reports a milk and dairy intolerance; amenable to trying [employer] supplement -- RD to order.  NUTRITION DIAGNOSIS: -Inadequate oral intake (NI-2.1).  Status: Ongoing  RELATED TO: decreased appetite, pancreatitis  AS EVIDENCE BY: pt report  MONITORING/EVALUATION(Goals): Goal: meet >90% of estimated nutrition needs to optimize nutritional status Monitor: PO intake, labs, weight, I/O's  EDUCATION NEEDS: -No education needs identified at this time  INTERVENTION:  Add Nurse, adult supplement PO BID with meals  RD to follow for nutrition care plan  Dietitian #: [phone]  DOCUMENTATION CODES Per  approved criteria  -Not Applicable    Alger Memos 11/26/2011, 3:20 PM

## 2011-11-26 NOTE — Progress Notes (Signed)
Per chart review and discussion with RN case manager, pt plans to dc home with home health and 24 hour supervision. No further csw needs at this time, signing off.   Catha Gosselin, Theresia Majors  [phone] .11/26/2011 11:32am

## 2011-11-26 NOTE — Progress Notes (Signed)
Subjective: Much more alert today. Aware she had a "bad day" yesterday. States was given xanax and pain pill the night before and feels this contributed to her unusual mentation yesterday.  Objective: Vital signs in last 24 hours: Temp:  [97.8 F (36.6 C)-98.5 F (36.9 C)] 98.3 F (36.8 C) (02/21 0713) Pulse Rate:  [60-126] 91  (02/21 0713) Resp:  [11-21] 16  (02/21 0713) BP: (111-127)/(45-64) 127/61 mmHg (02/21 0713) SpO2:  [92 %-98 %] 97 % (02/21 0713) Weight:  [59.7 kg (131 lb 9.8 oz)] 59.7 kg (131 lb 9.8 oz) (02/21 0400) Weight change: 0.2 kg (7.1 oz) Last BM Date: 11/24/11  Intake/Output from previous day: 02/20 0701 - 02/21 0700 In: 1420 [P.O.:720; IV Piggyback:400] Out: 1050 [Urine:1050]     Physical Exam: General: Alert, awake, oriented x3, in no acute distress. HEENT: No bruits, no goiter. Heart: IRIR, without murmurs, rubs, gallops. Lungs: Clear to auscultation bilaterally. Abdomen: Soft, nontender, nondistended, positive bowel sounds. Extremities: No clubbing cyanosis or edema with positive pedal pulses. Neuro: Grossly intact, nonfocal. Seems to have some mild short-term memory deficits.  Lab Results: Basic Metabolic Panel:  Basename 11/26/11 0452 11/25/11 0340  NA 139 133*  K 3.5 4.4  CL 101 96  CO2 26 26  GLUCOSE 145* 159*  BUN 17 14  CREATININE 0.93 0.90  CALCIUM 9.5 9.9  MG -- --  PHOS -- 3.8   CBC:  Basename 11/26/11 0452 11/25/11 0340  WBC 15.8* 18.6*  NEUTROABS -- --  HGB 10.9* 11.8*  HCT 33.5* 36.2  MCV 89.3 88.9  PLT 621* 646*   CBG:  Basename 11/26/11 0834 11/25/11 2143 11/25/11 1646 11/25/11 1218 11/25/11 0823 11/24/11 2137  GLUCAP 152* 183* 163* 149* 168* 166*    Recent Results (from the past 240 hour(s))  MRSA PCR SCREENING     Status: Normal   Collection Time   11/19/11 10:40 AM      Component Value Range Status Comment   MRSA by PCR NEGATIVE  NEGATIVE  Final   CULTURE, BLOOD (ROUTINE X 2)     Status: Normal (Preliminary  result)   Collection Time   11/21/11  1:30 PM      Component Value Range Status Comment   Specimen Description BLOOD RIGHT ARM   Final    Special Requests BOTTLES DRAWN AEROBIC ONLY 5CC   Final    Culture  Setup Time [id number]   Final    Culture     Final    Value:        BLOOD CULTURE RECEIVED NO GROWTH TO DATE CULTURE WILL BE HELD FOR 5 DAYS BEFORE ISSUING A FINAL NEGATIVE REPORT   Report Status PENDING   Incomplete   CULTURE, BLOOD (ROUTINE X 2)     Status: Normal (Preliminary result)   Collection Time   11/21/11  4:28 PM      Component Value Range Status Comment   Specimen Description BLOOD RIGHT HAND   Final    Special Requests BOTTLES DRAWN AEROBIC ONLY [facility]   Final    Culture  Setup Time [id number]   Final    Culture     Final    Value:        BLOOD CULTURE RECEIVED NO GROWTH TO DATE CULTURE WILL BE HELD FOR 5 DAYS BEFORE ISSUING A FINAL NEGATIVE REPORT   Report Status PENDING   Incomplete     Assessment/Plan:  1. Fever, Leukcytosis:  *Patient decompensated on 2/14 AM with A-Fib, Fever,  worsening abdominal pain.  *CT scan 2/14 unchanged, no worse but no improvement either, significant pancreatitis, Dr.  Elnoria Howard following post ERCP.  *CXR showed infiltrates vs. Edema -she responded very well to IV lasix (early in the hospitalization received significant volume resuscitation because of pancreatitis) *WBC still increasing infection vs. demarg/stress note fevers and blood cultures have been negative so far be chest x-ray shows no evidence of pneumonia *CONTINUE EMPIRIC ANTIBIOTICS (primaxin) FOR PNA and POST-ERCP PANCREATITIS?EARLY INFECTION day 7/10- started on 2/14.  * Foley dc'd  11/25/2011  2. A-Fib, probably paroxysmal, although seems to be persistent at this point, setting of acute pancreatitis:  *Has been loaded with digoxin and weaned off of IV Cardizem that ventricular rates between 60 and 90 *Continue oral Lopressor but will decrease dose from 100 to 50 BID so can add low  dose Cardizem (BP somewhat marginal) *Digoxin level low so will increase dose from 0.625 to 0.125 *CHADS2 risk score is 3 so she will likely need anticoagulation if this persists either with rivaroxiban or warfarin.  *We will hold anti-coag in acute setting given her pancreatitis. Per GI note pancreatitis nearly resolved so feel can ask pharmacy to dose coumadin now.  3. Pulmonary Infiltrate/Pulmonary edema:  *Clinically less Volume overloaded based on recent x-ray and clinical exam *Presented with fever and leukocytosis - initially presumed to have HCAP but CXR no definitive pneumonia- more c/w atelectasis *Because of persistant leukocytosis will continue Primaxin Day #6/10 *Lasix was discontinued 11/24/2011    4. Pancreatitis s/p ERCP  *Tolerating diet advance but with marginal intake *Primary management of pancreatitis per GI/Dr. Elnoria Howard  4. Vaginal Yeast Infection:  *Secondary to antibiotic use. Treated with oral Fluconazole.  5. Hyponatremia/hypokalemia *Hypokalemia likely related to volume shifting as well as recent use of Lasix *Sodium continues to trend back up after discontinuation of Lasix to be lead recent hyponatremia related to volume depletion *No evidence of orthostasis with orthostatic vital signs checked *[place] replete potassium and follow lytes  6. Deconditioning *Continue PT and OT evaluations and mobilize as tolerated *Note has issues with RVR with mobilizing  7. Disposition *Transfer to telemetry floor  8. family communication *Met with husband in room 11/25/2011. Would like to take pt home- has a good deal of DME at home- raised toilet, adjustable bed, etc. Likely will need HH PT/OT/aide   LOS: 14 days   ELLIS,ALLISON L., ANP [facility]  11/26/2011, 8:57 AM     I have examined the patient, reviewed the chart and discussed the plan with Junious Silk, NP. I agree with the above note.   Calvert Cantor, MD [phone]

## 2011-11-27 ENCOUNTER — Other Ambulatory Visit: Payer: Self-pay

## 2011-11-27 LAB — CULTURE, BLOOD (ROUTINE X 2)
Culture  Setup Time: 201302162002
Culture  Setup Time: 201302162002
Culture: NO GROWTH
Culture: NO GROWTH

## 2011-11-27 LAB — GLUCOSE, CAPILLARY
Glucose-Capillary: 148 mg/dL — ABNORMAL HIGH (ref 70–99)
Glucose-Capillary: 315 mg/dL — ABNORMAL HIGH (ref 70–99)
Glucose-Capillary: 225 mg/dL — ABNORMAL HIGH (ref 70–99)

## 2011-11-27 LAB — PROTIME-INR: INR: 1.39 (ref 0.00–1.49)

## 2011-11-27 LAB — CARDIAC PANEL(CRET KIN+CKTOT+MB+TROPI)
Relative Index: INVALID (ref 0.0–2.5)
Relative Index: INVALID (ref 0.0–2.5)
Troponin I: <0.3 ng/mL (ref <0.30)

## 2011-11-27 MED ORDER — MORPHINE SULFATE 2 MG/ML IJ SOLN
INTRAMUSCULAR | Status: AC
  Filled 2011-11-27: qty 1

## 2011-11-27 MED ORDER — DILTIAZEM HCL 100 MG IV SOLR
5.0000 mg/h | INTRAVENOUS | Status: DC
  Administered 2011-11-27: 10 mg/h via INTRAVENOUS
  Administered 2011-11-28 (×2): 5 mg/h via INTRAVENOUS
  Filled 2011-11-27 (×3): qty 100

## 2011-11-27 MED ORDER — WARFARIN SODIUM 4 MG PO TABS
4.0000 mg | ORAL_TABLET | Freq: Once | ORAL | Status: AC
  Administered 2011-11-27: 4 mg via ORAL
  Filled 2011-11-27: qty 1

## 2011-11-27 MED ORDER — MORPHINE SULFATE 2 MG/ML IJ SOLN
1.0000 mg | Freq: Once | INTRAMUSCULAR | Status: AC
  Administered 2011-11-27: 1 mg via INTRAVENOUS

## 2011-11-27 NOTE — Progress Notes (Signed)
Patient ID: [name] [name], female   DOB: [birth year], 78 y.o.   MRN: [id number] Subjective: Improving.  Still with intermittent right sided pain.  Objective: Vital signs in last 24 hours: Temp:  [97.7 F (36.5 C)-98.4 F (36.9 C)] 97.7 F (36.5 C) (02/22 0900) Pulse Rate:  [81-120] 82  (02/22 1053) Resp:  [17-26] 20  (02/22 0900) BP: (123-139)/(39-85) 139/63 mmHg (02/22 1053) SpO2:  [96 %-99 %] 99 % (02/22 0900) Weight:  [58.3 kg (128 lb 8.5 oz)] 58.3 kg (128 lb 8.5 oz) (02/22 0456) Last BM Date: 11/24/11  Intake/Output from previous day: 02/21 0701 - 02/22 0700 In: 1120 [P.O.:720; IV Piggyback:400] Out: 800 [Urine:800] Intake/Output this shift:    General appearance: alert and no distress GI: soft, non-tender; bowel sounds normal; no masses,  no organomegaly  Lab Results:  [employer] 11/26/11 0452 11/25/11 0340  WBC 15.8* 18.6*  HGB 10.9* 11.8*  HCT 33.5* 36.2  PLT 621* 646*   BMET  Basename 11/26/11 0452 11/25/11 0340  NA 139 133*  K 3.5 4.4  CL 101 96  CO2 26 26  GLUCOSE 145* 159*  BUN 17 14  CREATININE 0.93 0.90  CALCIUM 9.5 9.9   LFT  Basename 11/26/11 0452  PROT 6.9  ALBUMIN 2.4*  AST 29  ALT 14  ALKPHOS 64  BILITOT 0.3  BILIDIR --  IBILI --   PT/INR  Basename 11/27/11 0550 11/26/11 1046  LABPROT 17.3* 16.4*  INR 1.39 1.30   Hepatitis Panel No results found for this basename: HEPBSAG,HCVAB,HEPAIGM,HEPBIGM in the last 72 hours C-Diff No results found for this basename: CDIFFTOX:3 in the last 72 hours Fecal Lactopherrin No results found for this basename: FECLLACTOFRN in the last 72 hours  Studies/Results: No results found.  Medications:  Scheduled:   . antiseptic oral rinse  15 mL Mouth Rinse BID  . coumadin book   Does not apply Once  . digoxin  0.125 mg Oral Daily  . diltiazem  120 mg Oral Daily  . enoxaparin (LOVENOX) injection  40 mg Subcutaneous Q24H  . feeding supplement  1 Container Oral BID WC  . imipenem-cilastatin  250  mg Intravenous Q6H  . insulin aspart  0-9 Units Subcutaneous TID WC  . latanoprost  1 drop Both Eyes QHS  . lisinopril  5 mg Oral Daily  . metoprolol tartrate  50 mg Oral BID  .  morphine injection  1 mg Intravenous Once  . morphine      . mulitivitamin with minerals  1 tablet Oral Daily  . pantoprazole  40 mg Oral Q1200  . potassium chloride  40 mEq Oral Once  . simvastatin  40 mg Oral QPM  . warfarin  4 mg Oral ONCE-1800   Continuous:   . diltiazem (CARDIZEM) infusion      Assessment/Plan: 1) Severe post-ERCP pancreatitis.   I think she has almost recovered from the pancreatitis.  From the time that I started seeing the patient she always appeared to be weak.  Her diet can be advanced and she can continue to use pain medications PRN.    Plan: 1) PRN pain meds. 2) Advance diet. 3) I will reassess in the AM.  LOS: 15 days   Uri Covey D 11/27/2011, 10:56 AM

## 2011-11-27 NOTE — Progress Notes (Signed)
Nursing: Patient's hear rate ranged in the 120s-140s, sustaining. Patient complained of having some chest tightness, SOB, sweating, and pain. VS: 97.7 120 20 133/57 99% on 2L O2 via [place]. MD notified new orders were given. PRN lopressor was administered. Will continue to monitor patient for further changes in condition.

## 2011-11-27 NOTE — Progress Notes (Signed)
Patient ID: [name] [name], female   DOB: 22-Sep-1930, 78 y.o.   MRN: [id number]  Assessment/Plan:   Principal Problem:  *Fever, Leukcytosis:  *Patient decompensated on 2/14 AM with A-Fib, Fever, worsening abdominal pain.  *CT scan 2/14 unchanged,  significant pancreatitis, Dr. Elnoria Howard following post ERCP.  *CXR showed infiltrates vs. Edema -she responded very well to IV lasix (early in the hospitalization received significant volume resuscitation because of pancreatitis)  *empiric antibiotics (primaxin) FOR PNA and POST-ERCP PANCREATITIS?EARLY INFECTION day 8/10- started on 2/14.  * Foley dc'd 11/25/2011   Active Problems:  A-Fib, probably paroxysmal, although seems to be persistent at this point, setting of acute pancreatitis:  *Has been loaded with digoxin and weaned off of IV Cardizem that ventricular rates between 60 and 90 however cardizem drip has been restarted on 11/27/11 *hold oral Lopressor andCardizem  *Digoxin level low so will increase dose from 0.625 to 0.125  *CHADS2 risk score is 3 so she will likely need anticoagulation if this persists either with rivaroxiban or warfarin.  *coumadin per pharmacy  Pulmonary Infiltrate/Pulmonary edema:  *Clinically less Volume overloaded based on recent x-ray and clinical exam  *Presented with fever and leukocytosis - initially presumed to have HCAP but CXR no definitive pneumonia- more c/w atelectasis  *Because of persistant leukocytosis will continue Primaxin Day #8/10  *Lasix was discontinued 11/24/2011    Pancreatitis s/p ERCP  *Tolerating diet advance but with marginal intake  *Primary management of pancreatitis per GI/Dr. Elnoria Howard   Vaginal Yeast Infection:  *Secondary to antibiotic use. Treated with oral Fluconazole.   Hyponatremia/hypokalemia  *Hypokalemia likely related to volume shifting as well as recent use of Lasix  *Sodium continues to trend back up after discontinuation of Lasix to be lead recent hyponatremia related to  volume depletion  *No evidence of orthostasis with orthostatic vital signs checked  *replete potassium and follow lytes   Deconditioning  *Continue PT and OT evaluations and mobilize as tolerated   Subjective: No events overnight. Patient denies chest pain, shortness of breath, abdominal pain.  Objective:  Vital signs in last 24 hours:  Filed Vitals:   11/27/11 1053 11/27/11 1348 11/27/11 1400 11/27/11 2157  BP: 139/63 122/63 120/61 118/70  Pulse: 82 86 73 85  Temp:   98.9 F (37.2 C) 99.2 F (37.3 C)  TempSrc:   Oral Oral  Resp:   18 18  Height:      Weight:      SpO2:   99% 99%    Intake/Output from previous day:   Intake/Output Summary (Last 24 hours) at 11/27/11 2209 Last data filed at 11/27/11 2005  Gross per 24 hour  Intake 1312.5 ml  Output   1100 ml  Net  212.5 ml    Physical Exam: General: Alert, awake, oriented x3, in no acute distress. HEENT: No bruits, no goiter. Moist mucous membranes, no scleral icterus, no conjunctival pallor. Heart: Regular rate and rhythm, S1/S2 +, no murmurs, rubs, gallops. Lungs: Clear to auscultation bilaterally. No wheezing, no rhonchi, no rales.  Abdomen: Soft, nontender, nondistended, positive bowel sounds. Extremities: No clubbing or cyanosis, no pitting edema,  positive pedal pulses. Neuro: Grossly nonfocal.  Lab Results:  Basic Metabolic Panel:    Component Value Date/Time   NA 139 11/26/2011 0452   K 3.5 11/26/2011 0452   CL 101 11/26/2011 0452   CO2 26 11/26/2011 0452   BUN 17 11/26/2011 0452   CREATININE 0.93 11/26/2011 0452   GLUCOSE 145* 11/26/2011 0452   CALCIUM 9.5  11/26/2011 0452   CBC:    Component Value Date/Time   WBC 15.8* 11/26/2011 0452   HGB 10.9* 11/26/2011 0452   HCT 33.5* 11/26/2011 0452   PLT 621* 11/26/2011 0452   MCV 89.3 11/26/2011 0452   NEUTROABS 6.2 07/24/2008 1045   LYMPHSABS 1.3 07/24/2008 1045   MONOABS 0.3 07/24/2008 1045   EOSABS 0.1 07/24/2008 1045   BASOSABS 0.0 07/24/2008 1045       Lab 11/26/11 0452 11/25/11 0340 11/24/11 0505 11/22/11 1023 11/22/11 0450  WBC 15.8* 18.6* 19.4* 21.9* 21.2*  HGB 10.9* 11.8* 10.8* 11.6* 11.2*  HCT 33.5* 36.2 33.7* 34.3* 33.5*  PLT 621* 646* 643* 558* 548*  MCV 89.3 88.9 90.6 88.9 88.2  MCH 29.1 29.0 29.0 30.1 29.5  MCHC 32.5 32.6 32.0 33.8 33.4  RDW 13.7 13.5 13.8 13.8 13.8  LYMPHSABS -- -- -- -- --  MONOABS -- -- -- -- --  EOSABS -- -- -- -- --  BASOSABS -- -- -- -- --  BANDABS -- -- -- -- --    Lab 11/26/11 0452 11/25/11 0340 11/24/11 0505 11/22/11 1023 11/22/11 0450  NA 139 133* 131* 131* 133*  K 3.5 4.4 6.0* 4.8 4.6  CL 101 96 96 94* 96  CO2 26 26 27 26 28   GLUCOSE 145* 159* 189* 253* 152*  BUN 17 14 14 12 12   CREATININE 0.93 0.90 0.95 0.83 0.86  CALCIUM 9.5 9.9 9.8 9.6 9.7  MG -- -- -- -- --    Lab 11/27/11 0550 11/26/11 1046  INR 1.39 1.30  PROTIME -- --   Cardiac markers:  Lab 11/27/11 1629 11/27/11 1000  CKMB 1.7 1.7  TROPONINI <0.30 <0.30  MYOGLOBIN -- --    Recent Results (from the past 240 hour(s))  MRSA PCR SCREENING     Status: Normal   Collection Time   11/19/11 10:40 AM      Component Value Range Status Comment   MRSA by PCR NEGATIVE  NEGATIVE  Final   CULTURE, BLOOD (ROUTINE X 2)     Status: Normal   Collection Time   11/21/11  1:30 PM      Component Value Range Status Comment   Specimen Description BLOOD RIGHT ARM   Final    Special Requests BOTTLES DRAWN AEROBIC ONLY 5CC   Final    Culture  Setup Time [id number]   Final    Culture NO GROWTH 5 DAYS   Final    Report Status 11/27/2011 FINAL   Final   CULTURE, BLOOD (ROUTINE X 2)     Status: Normal   Collection Time   11/21/11  4:28 PM      Component Value Range Status Comment   Specimen Description BLOOD RIGHT HAND   Final    Special Requests BOTTLES DRAWN AEROBIC ONLY [facility]   Final    Culture  Setup Time [id number]   Final    Culture NO GROWTH 5 DAYS   Final    Report Status 11/27/2011 FINAL   Final      Medications: Scheduled Meds:   . antiseptic oral rinse  15 mL Mouth Rinse BID  . digoxin  0.125 mg Oral Daily  . diltiazem  120 mg Oral Daily  . enoxaparin (LOVENOX) injection  40 mg Subcutaneous Q24H  . feeding supplement  1 Container Oral BID WC  . imipenem-cilastatin  250 mg Intravenous Q6H  . insulin aspart  0-9 Units Subcutaneous TID WC  . latanoprost  1 drop Both  Eyes QHS  . lisinopril  5 mg Oral Daily  . metoprolol tartrate  50 mg Oral BID  .  morphine injection  1 mg Intravenous Once  . morphine      . mulitivitamin with minerals  1 tablet Oral Daily  . pantoprazole  40 mg Oral Q1200  . simvastatin  40 mg Oral QPM  . warfarin  4 mg Oral ONCE-1800   Continuous Infusions:   . diltiazem (CARDIZEM) infusion 10 mg/hr (11/27/11 1150)   PRN Meds:.acetaminophen, ALPRAZolam, diphenhydrAMINE, diphenhydrAMINE, HYDROmorphone (DILAUDID) injection, hyoscyamine, metoprolol, ondansetron (ZOFRAN) IV, ondansetron, sodium chloride    EDUCATION - test results and diagnostic studies were discussed with patient  at the bedside - questions were answered at the bedside and contact information was provided for additional questions or concerns   LOS: 15 days   Cadey Bazile 11/27/2011, 10:09 PM  [facility] Pager: [phone]

## 2011-11-27 NOTE — Progress Notes (Signed)
ANTICOAGULATION CONSULT NOTE - Follow Up Consult  Pharmacy Consult for Coumadin Indication: atrial fibrillation  Allergies  Allergen Reactions  . Lactose Intolerance (Gi)     Nausea and vomiting, severe stomach pain  . Penicillins Hives and Itching  . Sulfa Antibiotics Hives and Itching    Patient Measurements: Height: 5' (152.4 cm) Weight: 128 lb 8.5 oz (58.3 kg) IBW/kg (Calculated) : 45.5   Vital Signs: Temp: 98.9 F (37.2 C) (02/22 1400) Temp src: Oral (02/22 1400) BP: 120/61 mmHg (02/22 1400) Pulse Rate: 73  (02/22 1400)  Labs:  Basename 11/27/11 1000 11/27/11 0550 11/26/11 1046 11/26/11 0452 11/25/11 0340  HGB -- -- -- 10.9* 11.8*  HCT -- -- -- 33.5* 36.2  PLT -- -- -- 621* 646*  APTT -- -- -- -- --  LABPROT -- 17.3* 16.4* -- --  INR -- 1.39 1.30 -- --  HEPARINUNFRC -- -- -- -- --  CREATININE -- -- -- 0.93 0.90  CKTOTAL 18 -- -- -- --  CKMB 1.7 -- -- -- --  TROPONINI <0.30 -- -- -- --   Estimated Creatinine Clearance: 39.2 ml/min (by C-G formula based on Cr of 0.93).  Assessment:   Little change in INR after 1st dose of Coumadin last pm.  Also on Lovenox 40 mg SQ Q24h.  Goal of Therapy:  INR 2-3   Plan:    Will repeat Coumadin 4 mg PO tonight.   Continue daily PT/INR.   Will consider increasing dose 2/23, if INR not rising.  Dennie Fetters, [place] Pager: [phone] 11/27/2011,3:40 PM

## 2011-11-28 LAB — BASIC METABOLIC PANEL
CO2: 22 mEq/L (ref 19–32)
Chloride: 99 mEq/L (ref 96–112)
Sodium: 134 mEq/L — ABNORMAL LOW (ref 135–145)

## 2011-11-28 LAB — CBC
MCV: 89.2 fL (ref 78.0–100.0)
Platelets: 515 10*3/uL — ABNORMAL HIGH (ref 150–400)
RBC: 3.6 MIL/uL — ABNORMAL LOW (ref 3.87–5.11)
WBC: 15 10*3/uL — ABNORMAL HIGH (ref 4.0–10.5)

## 2011-11-28 LAB — GLUCOSE, CAPILLARY
Glucose-Capillary: 160 mg/dL — ABNORMAL HIGH (ref 70–99)
Glucose-Capillary: 218 mg/dL — ABNORMAL HIGH (ref 70–99)
Glucose-Capillary: 150 mg/dL — ABNORMAL HIGH (ref 70–99)
Glucose-Capillary: 259 mg/dL — ABNORMAL HIGH (ref 70–99)

## 2011-11-28 LAB — CARDIAC PANEL(CRET KIN+CKTOT+MB+TROPI): Relative Index: INVALID (ref 0.0–2.5)

## 2011-11-28 MED ORDER — WARFARIN SODIUM 3 MG PO TABS
3.0000 mg | ORAL_TABLET | Freq: Once | ORAL | Status: AC
  Administered 2011-11-28: 3 mg via ORAL
  Filled 2011-11-28 (×2): qty 1

## 2011-11-28 MED ORDER — CIPROFLOXACIN HCL 500 MG PO TABS
500.0000 mg | ORAL_TABLET | Freq: Two times a day (BID) | ORAL | Status: DC
  Administered 2011-11-28 – 2011-11-30 (×5): 500 mg via ORAL
  Filled 2011-11-28 (×7): qty 1

## 2011-11-28 NOTE — Progress Notes (Signed)
Patient ID: [name] [name], female   DOB: 12/02/1929, 78 y.o.   MRN: [id number] Subjective: No acute events.  She feels well and she had a good day yesterday.  Mild intermittent ABM pain.  Objective: Vital signs in last 24 hours: Temp:  [97.7 F (36.5 C)-99.2 F (37.3 C)] 99 F (37.2 C) (02/23 0300) Pulse Rate:  [68-120] 68  (02/23 0300) Resp:  [18-20] 18  (02/23 0300) BP: (118-139)/(57-70) 119/65 mmHg (02/23 0300) SpO2:  [99 %] 99 % (02/23 0300) Weight:  [58.469 kg (128 lb 14.4 oz)] 58.469 kg (128 lb 14.4 oz) (02/23 0300) Last BM Date: 11/26/11  Intake/Output from previous day: 02/22 0701 - 02/23 0700 In: 1252.5 [P.O.:840; I.V.:112.5; IV Piggyback:300] Out: 1500 [Urine:1500] Intake/Output this shift:    General appearance: alert and no distress GI: soft, non-tender; bowel sounds normal; no masses,  no organomegaly  Lab Results:  Basename 11/28/11 0140 11/26/11 0452  WBC 15.0* 15.8*  HGB 10.5* 10.9*  HCT 32.1* 33.5*  PLT 515* 621*   BMET  Basename 11/28/11 0140 11/26/11 0452  NA 134* 139  K 3.7 3.5  CL 99 101  CO2 22 26  GLUCOSE 142* 145*  BUN 12 17  CREATININE 0.73 0.93  CALCIUM 9.2 9.5   LFT  Basename 11/26/11 0452  PROT 6.9  ALBUMIN 2.4*  AST 29  ALT 14  ALKPHOS 64  BILITOT 0.3  BILIDIR --  IBILI --   PT/INR  Basename 11/28/11 0140 11/27/11 0550  LABPROT 22.3* 17.3*  INR 1.92* 1.39   Hepatitis Panel No results found for this basename: HEPBSAG,HCVAB,HEPAIGM,HEPBIGM in the last 72 hours C-Diff No results found for this basename: CDIFFTOX:3 in the last 72 hours Fecal Lactopherrin No results found for this basename: FECLLACTOFRN in the last 72 hours  Studies/Results: No results found.  Medications:  Scheduled:   . antiseptic oral rinse  15 mL Mouth Rinse BID  . digoxin  0.125 mg Oral Daily  . diltiazem  120 mg Oral Daily  . enoxaparin (LOVENOX) injection  40 mg Subcutaneous Q24H  . feeding supplement  1 Container Oral BID WC  .  imipenem-cilastatin  250 mg Intravenous Q6H  . insulin aspart  0-9 Units Subcutaneous TID WC  . latanoprost  1 drop Both Eyes QHS  . lisinopril  5 mg Oral Daily  . metoprolol tartrate  50 mg Oral BID  .  morphine injection  1 mg Intravenous Once  . morphine      . mulitivitamin with minerals  1 tablet Oral Daily  . pantoprazole  40 mg Oral Q1200  . simvastatin  40 mg Oral QPM  . warfarin  4 mg Oral ONCE-1800   Continuous:   . diltiazem (CARDIZEM) infusion 5 mg/hr (11/28/11 0248)    Assessment/Plan: 1) Severe post-ERCP pancreatitis.    I think she has recovered from her pancreatitis.  No further GI intervention at this time.  Plan:  1) PRN pain meds.  2) D/C Primaxin. 3) Cipro x 5 days orally. 4) Signing off.   LOS: 16 days   Nile Prisk D 11/28/2011, 7:35 AM

## 2011-11-28 NOTE — Progress Notes (Signed)
ANTICOAGULATION CONSULT NOTE - Follow Up Consult  Pharmacy Consult for Coumadin Indication: atrial fibrillation  Allergies  Allergen Reactions  . Lactose Intolerance (Gi)     Nausea and vomiting, severe stomach pain  . Penicillins Hives and Itching  . Sulfa Antibiotics Hives and Itching    Patient Measurements: Height: 5' (152.4 cm) Weight: 128 lb 14.4 oz (58.469 kg) IBW/kg (Calculated) : 45.5   Vital Signs: Temp: 99 F (37.2 C) (02/23 0300) Temp src: Oral (02/23 0300) BP: 119/65 mmHg (02/23 0300) Pulse Rate: 68  (02/23 0300)  Labs:  Basename 11/28/11 0140 11/28/11 0123 11/27/11 1629 11/27/11 1000 11/27/11 0550 11/26/11 1046 11/26/11 0452  HGB 10.5* -- -- -- -- -- 10.9*  HCT 32.1* -- -- -- -- -- 33.5*  PLT 515* -- -- -- -- -- 621*  APTT -- -- -- -- -- -- --  LABPROT 22.3* -- -- -- 17.3* 16.4* --  INR 1.92* -- -- -- 1.39 1.30 --  HEPARINUNFRC -- -- -- -- -- -- --  CREATININE 0.73 -- -- -- -- -- 0.93  CKTOTAL -- 22 19 18  -- -- --  CKMB -- 1.6 1.7 1.7 -- -- --  TROPONINI -- <0.30 <0.30 <0.30 -- -- --   Estimated Creatinine Clearance: 45.6 ml/min (by C-G formula based on Cr of 0.73).  Assessment: 78 year old female s/p ERCP on 2/8 then decompensated 2/14 with AFib. AFib is persistent and anticoagulation to start as CHADS2 score is 3.  Baseline INR 2/12 = 1.30. Got 4 mg dose 2/21 --> 1.39, received another 4 mg dose 2/22. INR today 1.92 (subtherapeutic) with significant jump in INR, will decrease dose a little. Also on Lovenox 40 mg SQ Q24h. Started Cipro today for 5 days (Primaxin d/c'd, completed course).   Goal of Therapy:  INR 2-3   Plan:    Coumadin 3 mg PO tonight.   Continue daily PT/INR.   Discontinue Lovenox for DVT ppx when INR >2.   Concha Norway 11/28/2011,9:41 AM

## 2011-11-28 NOTE — Progress Notes (Signed)
Patient ID: [name] [name], female   DOB: 12/04/1929, 78 y.o.   MRN: [id number]  Assessment/Plan:   Principal Problem:   *Fever, Leukcytosis:  *Patient decompensated on 2/14 AM with A-Fib, Fever, worsening abdominal pain.  *CT scan 2/14 unchanged, significant pancreatitis, Dr. Elnoria Howard following post ERCP.  *CXR showed infiltrates vs. Edema -she responded very well to IV lasix (early in the hospitalization received significant volume resuscitation because of pancreatitis)  *Primaxin discontinued and ciprofloxacin by mouth started today 11/28/2011  Active Problems:   A-Fib, probably paroxysmal, although seems to be persistent at this point, setting of acute pancreatitis:  *Has been loaded with digoxin and weaned off of IV Cardizem that ventricular rates between 60 and 90 however cardizem drip has been restarted on 11/27/11  *hold oral Lopressor andCardizem  *Digoxin level low so will increase dose from 0.625 to 0.125  *CHADS2 risk score is 3 so she will likely need anticoagulation if this persists either with rivaroxiban or warfarin.  *coumadin per pharmacy   Pulmonary Infiltrate/Pulmonary edema:  *Clinically less Volume overloaded based on recent x-ray and clinical exam  *Presented with fever and leukocytosis - initially presumed to have HCAP but CXR no definitive pneumonia- more c/w atelectasis  *Because of persistant leukocytosis will continue Primaxin Day #8/10  *Lasix was discontinued 11/24/2011   Pancreatitis s/p ERCP  *Tolerating diet advance but with marginal intake  *Primary management of pancreatitis per GI/Dr. Elnoria Howard   Vaginal Yeast Infection:  *Secondary to antibiotic use. Treated with oral Fluconazole.   Hyponatremia/hypokalemia  *Hypokalemia likely related to volume shifting as well as recent use of Lasix  *Sodium continues to trend back up after discontinuation of Lasix to be lead recent hyponatremia related to volume depletion  *No evidence of orthostasis with  orthostatic vital signs checked  *replete potassium and follow lytes   Deconditioning  *Continue PT and OT evaluations and mobilize as tolerated  *Anticipate discharge tomorrow morning or Monday morning  Subjective: No events overnight. Patient denies chest pain, shortness of breath, abdominal pain.   Objective:  Vital signs in last 24 hours:  Filed Vitals:   11/27/11 1400 11/27/11 2157 11/28/11 0300 11/28/11 1516  BP: 120/61 118/70 119/65 126/48  Pulse: 73 85 68 82  Temp: 98.9 F (37.2 C) 99.2 F (37.3 C) 99 F (37.2 C) 98.1 F (36.7 C)  TempSrc: Oral Oral Oral Oral  Resp: 18 18 18 19   Height:      Weight:   58.469 kg (128 lb 14.4 oz)   SpO2: 99% 99% 99% 98%    Intake/Output from previous day:   Intake/Output Summary (Last 24 hours) at 11/28/11 1546 Last data filed at 11/28/11 1524  Gross per 24 hour  Intake  892.5 ml  Output   1750 ml  Net -857.5 ml    Physical Exam: General: Alert, awake, oriented x3, in no acute distress. HEENT: No bruits, no goiter. Moist mucous membranes, no scleral icterus, no conjunctival pallor. Heart: Regular rate and rhythm, S1/S2 +, no murmurs, rubs, gallops. Lungs: Clear to auscultation bilaterally. No wheezing, no rhonchi, no rales.  Abdomen: Soft, nontender, nondistended, positive bowel sounds. Extremities: No clubbing or cyanosis, no pitting edema,  positive pedal pulses. Neuro: Grossly nonfocal.  Lab Results:  Basic Metabolic Panel:    Component Value Date/Time   NA 134* 11/28/2011 0140   K 3.7 11/28/2011 0140   CL 99 11/28/2011 0140   CO2 22 11/28/2011 0140   BUN 12 11/28/2011 0140   CREATININE 0.73 11/28/2011 0140  GLUCOSE 142* 11/28/2011 0140   CALCIUM 9.2 11/28/2011 0140   CBC:    Component Value Date/Time   WBC 15.0* 11/28/2011 0140   HGB 10.5* 11/28/2011 0140   HCT 32.1* 11/28/2011 0140   PLT 515* 11/28/2011 0140   MCV 89.2 11/28/2011 0140   NEUTROABS 6.2 07/24/2008 1045   LYMPHSABS 1.3 07/24/2008 1045   MONOABS 0.3  07/24/2008 1045   EOSABS 0.1 07/24/2008 1045   BASOSABS 0.0 07/24/2008 1045      Lab 11/28/11 0140 11/26/11 0452 11/25/11 0340 11/24/11 0505 11/22/11 1023  WBC 15.0* 15.8* 18.6* 19.4* 21.9*  HGB 10.5* 10.9* 11.8* 10.8* 11.6*  HCT 32.1* 33.5* 36.2 33.7* 34.3*  PLT 515* 621* 646* 643* 558*  MCV 89.2 89.3 88.9 90.6 88.9  MCH 29.2 29.1 29.0 29.0 30.1  MCHC 32.7 32.5 32.6 32.0 33.8  RDW 13.4 13.7 13.5 13.8 13.8  LYMPHSABS -- -- -- -- --  MONOABS -- -- -- -- --  EOSABS -- -- -- -- --  BASOSABS -- -- -- -- --  BANDABS -- -- -- -- --    Lab 11/28/11 0140 11/26/11 0452 11/25/11 0340 11/24/11 0505 11/22/11 1023  NA 134* 139 133* 131* 131*  K 3.7 3.5 4.4 6.0* 4.8  CL 99 101 96 96 94*  CO2 22 26 26 27 26   GLUCOSE 142* 145* 159* 189* 253*  BUN 12 17 14 14 12   CREATININE 0.73 0.93 0.90 0.95 0.83  CALCIUM 9.2 9.5 9.9 9.8 9.6  MG -- -- -- -- --    Lab 11/28/11 0140 11/27/11 0550 11/26/11 1046  INR 1.92* 1.39 1.30  PROTIME -- -- --   Cardiac markers:  Lab 11/28/11 0123 11/27/11 1629 11/27/11 1000  CKMB 1.6 1.7 1.7  TROPONINI <0.30 <0.30 <0.30  MYOGLOBIN -- -- --   No components found with this basename: POCBNP:3 Recent Results (from the past 240 hour(s))  MRSA PCR SCREENING     Status: Normal   Collection Time   11/19/11 10:40 AM      Component Value Range Status Comment   MRSA by PCR NEGATIVE  NEGATIVE  Final   CULTURE, BLOOD (ROUTINE X 2)     Status: Normal   Collection Time   11/21/11  1:30 PM      Component Value Range Status Comment   Specimen Description BLOOD RIGHT ARM   Final    Special Requests BOTTLES DRAWN AEROBIC ONLY 5CC   Final    Culture  Setup Time [id number]   Final    Culture NO GROWTH 5 DAYS   Final    Report Status 11/27/2011 FINAL   Final   CULTURE, BLOOD (ROUTINE X 2)     Status: Normal   Collection Time   11/21/11  4:28 PM      Component Value Range Status Comment   Specimen Description BLOOD RIGHT HAND   Final    Special Requests BOTTLES DRAWN  AEROBIC ONLY [facility]   Final    Culture  Setup Time [id number]   Final    Culture NO GROWTH 5 DAYS   Final    Report Status 11/27/2011 FINAL   Final     Medications: Scheduled Meds:   . antiseptic oral rinse  15 mL Mouth Rinse BID  . ciprofloxacin  500 mg Oral BID  . digoxin  0.125 mg Oral Daily  . diltiazem  120 mg Oral Daily  . enoxaparin (LOVENOX) injection  40 mg Subcutaneous Q24H  . feeding supplement  1 Container Oral BID WC  . insulin aspart  0-9 Units Subcutaneous TID WC  . latanoprost  1 drop Both Eyes QHS  . lisinopril  5 mg Oral Daily  . metoprolol tartrate  50 mg Oral BID  . morphine      . mulitivitamin with minerals  1 tablet Oral Daily  . pantoprazole  40 mg Oral Q1200  . simvastatin  40 mg Oral QPM  . warfarin  3 mg Oral ONCE-1800  . warfarin  4 mg Oral ONCE-1800  . DISCONTD: imipenem-cilastatin  250 mg Intravenous Q6H   Continuous Infusions:   . diltiazem (CARDIZEM) infusion 5 mg/hr (11/28/11 0248)   PRN Meds:.acetaminophen, ALPRAZolam, diphenhydrAMINE, diphenhydrAMINE, HYDROmorphone (DILAUDID) injection, hyoscyamine, metoprolol, ondansetron (ZOFRAN) IV, ondansetron, sodium chloride   EDUCATION - test results and diagnostic studies were discussed with patient and pt's family who was present at the bedside - patient and family have verbalized the understanding - questions were answered at the bedside and contact information was provided for additional questions or concerns   LOS: 16 days   Norval Slaven 11/28/2011, 3:46 PM  [facility] Pager: [phone]

## 2011-11-29 ENCOUNTER — Inpatient Hospital Stay (HOSPITAL_COMMUNITY): Payer: Medicare Other

## 2011-11-29 LAB — BASIC METABOLIC PANEL
Calcium: 9.6 mg/dL (ref 8.4–10.5)
Creatinine, Ser: 0.72 mg/dL (ref 0.50–1.10)
GFR calc Af Amer: >90 mL/min (ref >90)

## 2011-11-29 LAB — GLUCOSE, CAPILLARY
Glucose-Capillary: 198 mg/dL — ABNORMAL HIGH (ref 70–99)
Glucose-Capillary: 267 mg/dL — ABNORMAL HIGH (ref 70–99)
Glucose-Capillary: 168 mg/dL — ABNORMAL HIGH (ref 70–99)

## 2011-11-29 LAB — CBC
MCH: 28.7 pg (ref 26.0–34.0)
MCV: 90.1 fL (ref 78.0–100.0)
Platelets: 446 10*3/uL — ABNORMAL HIGH (ref 150–400)
RDW: 13.7 % (ref 11.5–15.5)

## 2011-11-29 MED ORDER — ATORVASTATIN CALCIUM 10 MG PO TABS
20.0000 mg | ORAL_TABLET | Freq: Every day | ORAL | Status: DC
  Administered 2011-11-29: 20 mg via ORAL
  Filled 2011-11-29 (×2): qty 2

## 2011-11-29 MED ORDER — WARFARIN SODIUM 4 MG PO TABS
4.0000 mg | ORAL_TABLET | Freq: Once | ORAL | Status: AC
Start: 2011-11-29 — End: 2011-11-29
  Administered 2011-11-29: 4 mg via ORAL
  Filled 2011-11-29: qty 1

## 2011-11-29 NOTE — Progress Notes (Signed)
ANTICOAGULATION CONSULT NOTE - Follow Up Consult  Pharmacy Consult for Coumadin Indication: atrial fibrillation  Allergies  Allergen Reactions  . Lactose Intolerance (Gi)     Nausea and vomiting, severe stomach pain  . Penicillins Hives and Itching  . Sulfa Antibiotics Hives and Itching    Patient Measurements: Height: 5' (152.4 cm) Weight: 127 lb 8 oz (57.834 kg) (Scale A) IBW/kg (Calculated) : 45.5   Vital Signs: Temp: 99.3 F (37.4 C) (02/24 0421) Temp src: Oral (02/24 0421) BP: 112/58 mmHg (02/24 0421) Pulse Rate: 83  (02/24 0421)  Labs:  Alvira Philips 11/29/11 0514 11/28/11 0140 11/28/11 0123 11/27/11 1629 11/27/11 1000 11/27/11 0550  HGB 9.6* 10.5* -- -- -- --  HCT 30.1* 32.1* -- -- -- --  PLT 446* 515* -- -- -- --  APTT -- -- -- -- -- --  LABPROT 23.6* 22.3* -- -- -- 17.3*  INR 2.06* 1.92* -- -- -- 1.39  HEPARINUNFRC -- -- -- -- -- --  CREATININE 0.72 0.73 -- -- -- --  CKTOTAL -- -- 22 19 18  --  CKMB -- -- 1.6 1.7 1.7 --  TROPONINI -- -- <0.30 <0.30 <0.30 --   Estimated Creatinine Clearance: 45.4 ml/min (by C-G formula based on Cr of 0.72).  Assessment: 78 year old female s/p ERCP on 2/8 then decompensated 2/14 with AFib. AFib is persistent and anticoagulation to start as CHADS2 score is 3.  Baseline INR 2/12 = 1.30. INR today is therapeutic (borderline) at 2.06 after 2 doses of 4 mg and one dose of 3 mg. Will give a slightly higher dose than yesterday to ensure that pt remains therapeutic. Also on Lovenox 40 mg SQ Q24h; will d/c today, pt did receive dose this AM so is covered for VTE ppx with borderline INR. D#2/5 Cipro (Primaxin d/c'd 2/23, completed course).   Goal of Therapy:  INR 2-3   Plan:    Coumadin 4 mg PO tonight.   Continue daily PT/INR.   Will discontinue Lovenox for DVT ppx as INR >2.   Concha Norway 11/29/2011,11:10 AM

## 2011-11-29 NOTE — Progress Notes (Signed)
Patient ID: [name] [name], female   DOB: 1929/10/29, 78 y.o.   MRN: [id number]  Assessment/Plan:   Principal Problem:   *Fever, Leukcytosis:  *Patient decompensated on 2/14 AM with A-Fib, Fever, worsening abdominal pain.  *CT scan 2/14 unchanged, significant pancreatitis, Dr. Elnoria Howard following post ERCP.  *CXR showed infiltrates vs. Edema -she responded very well to IV lasix (early in the hospitalization received significant volume resuscitation because of pancreatitis)  *Primaxin discontinued and ciprofloxacin by mouth started today 11/28/2011   Active Problems:   A-Fib, probably paroxysmal, although seems to be persistent at this point, setting of acute pancreatitis:  *Has been loaded with digoxin and weaned off of IV Cardizem that ventricular rates between 60 and 90 however cardizem drip has been restarted on 11/27/11  *hold oral Lopressor andCardizem  *Digoxin level low so will increase dose from 0.625 to 0.125  *CHADS2 risk score is 3 so she will likely need anticoagulation if this persists either with rivaroxiban or warfarin.  *coumadin per pharmacy  - INR therapeutic  Pulmonary Infiltrate/Pulmonary edema:  *Clinically less Volume overloaded based on recent x-ray and clinical exam  *Presented with fever and leukocytosis - initially presumed to have HCAP but CXR no definitive pneumonia- more c/w atelectasis  *Primaxin Discontinued and cipro PO started (2/5 days) *Lasix was discontinued 11/24/2011   Pancreatitis s/p ERCP  *Tolerating diet advance but with marginal intake  *Primary management of pancreatitis per GI/Dr. Elnoria Howard - signed off  Vaginal Yeast Infection:  *Secondary to antibiotic use. Treated with oral Fluconazole.   Hyponatremia/hypokalemia  *Hypokalemia likely related to volume shifting as well as recent use of Lasix  *Sodium continues to trend back up after discontinuation of Lasix to be lead recent hyponatremia related to volume depletion  *No evidence of  orthostasis with orthostatic vital signs checked  *replete and follow up BMP in am  Deconditioning  *Continue PT and OT evaluations and mobilize as tolerated  *Anticipate discharge Monday morning   Subjective: No events overnight. Patient denies chest pain, shortness of breath, abdominal pain.   Objective:  Vital signs in last 24 hours:  Filed Vitals:   11/28/11 0300 11/28/11 1516 11/28/11 2051 11/29/11 0421  BP: 119/65 126/48 137/65 112/58  Pulse: 68 82 106 83  Temp: 99 F (37.2 C) 98.1 F (36.7 C) 98.8 F (37.1 C) 99.3 F (37.4 C)  TempSrc: Oral Oral Oral Oral  Resp: 18 19 18 18   Height:      Weight: 58.469 kg (128 lb 14.4 oz)   57.834 kg (127 lb 8 oz)  SpO2: 99% 98% 97% 96%    Intake/Output from previous day:   Intake/Output Summary (Last 24 hours) at 11/29/11 1248 Last data filed at 11/29/11 1149  Gross per 24 hour  Intake    616 ml  Output   1000 ml  Net   -384 ml    Physical Exam: General: Alert, awake, oriented x3, in no acute distress. HEENT: No bruits, no goiter. Moist mucous membranes, no scleral icterus, no conjunctival pallor. Heart: Regular rate and rhythm, S1/S2 +, no murmurs, rubs, gallops. Lungs: Clear to auscultation bilaterally. No wheezing, no rhonchi, no rales.  Abdomen: Soft, nontender, nondistended, positive bowel sounds. Extremities: No clubbing or cyanosis, no pitting edema,  positive pedal pulses. Neuro: Grossly nonfocal.  Lab Results:  Basic Metabolic Panel:    Component Value Date/Time   NA 133* 11/29/2011 0514   K 4.6 11/29/2011 0514   CL 99 11/29/2011 0514   CO2 23 11/29/2011 0514  BUN 15 11/29/2011 0514   CREATININE 0.72 11/29/2011 0514   GLUCOSE 172* 11/29/2011 0514   CALCIUM 9.6 11/29/2011 0514   CBC:    Component Value Date/Time   WBC 12.2* 11/29/2011 0514   HGB 9.6* 11/29/2011 0514   HCT 30.1* 11/29/2011 0514   PLT 446* 11/29/2011 0514   MCV 90.1 11/29/2011 0514   NEUTROABS 6.2 07/24/2008 1045   LYMPHSABS 1.3 07/24/2008  1045   MONOABS 0.3 07/24/2008 1045   EOSABS 0.1 07/24/2008 1045   BASOSABS 0.0 07/24/2008 1045      Lab 11/29/11 0514 11/28/11 0140 11/26/11 0452 11/25/11 0340 11/24/11 0505  WBC 12.2* 15.0* 15.8* 18.6* 19.4*  HGB 9.6* 10.5* 10.9* 11.8* 10.8*  HCT 30.1* 32.1* 33.5* 36.2 33.7*  PLT 446* 515* 621* 646* 643*  MCV 90.1 89.2 89.3 88.9 90.6  MCH 28.7 29.2 29.1 29.0 29.0  MCHC 31.9 32.7 32.5 32.6 32.0  RDW 13.7 13.4 13.7 13.5 13.8  LYMPHSABS -- -- -- -- --  MONOABS -- -- -- -- --  EOSABS -- -- -- -- --  BASOSABS -- -- -- -- --  BANDABS -- -- -- -- --    Lab 11/29/11 0514 11/28/11 0140 11/26/11 0452 11/25/11 0340 11/24/11 0505  NA 133* 134* 139 133* 131*  K 4.6 3.7 3.5 4.4 6.0*  CL 99 99 101 96 96  CO2 23 22 26 26 27   GLUCOSE 172* 142* 145* 159* 189*  BUN 15 12 17 14 14   CREATININE 0.72 0.73 0.93 0.90 0.95  CALCIUM 9.6 9.2 9.5 9.9 9.8  MG -- -- -- -- --    Lab 11/29/11 0514 11/28/11 0140 11/27/11 0550 11/26/11 1046  INR 2.06* 1.92* 1.39 1.30  PROTIME -- -- -- --   Cardiac markers:  Lab 11/28/11 0123 11/27/11 1629 11/27/11 1000  CKMB 1.6 1.7 1.7  TROPONINI <0.30 <0.30 <0.30  MYOGLOBIN -- -- --    Recent Results (from the past 240 hour(s))  CULTURE, BLOOD (ROUTINE X 2)     Status: Normal   Collection Time   11/21/11  1:30 PM      Component Value Range Status Comment   Specimen Description BLOOD RIGHT ARM   Final    Special Requests BOTTLES DRAWN AEROBIC ONLY 5CC   Final    Culture  Setup Time [id number]   Final    Culture NO GROWTH 5 DAYS   Final    Report Status 11/27/2011 FINAL   Final   CULTURE, BLOOD (ROUTINE X 2)     Status: Normal   Collection Time   11/21/11  4:28 PM      Component Value Range Status Comment   Specimen Description BLOOD RIGHT HAND   Final    Special Requests BOTTLES DRAWN AEROBIC ONLY [facility]   Final    Culture  Setup Time [id number]   Final    Culture NO GROWTH 5 DAYS   Final    Report Status 11/27/2011 FINAL   Final     Medications: Scheduled Meds:   . antiseptic oral rinse  15 mL Mouth Rinse BID  . atorvastatin  20 mg Oral q1800  . ciprofloxacin  500 mg Oral BID  . digoxin  0.125 mg Oral Daily  . diltiazem  120 mg Oral Daily  . feeding supplement  1 Container Oral BID WC  . insulin aspart  0-9 Units Subcutaneous TID WC  . latanoprost  1 drop Both Eyes QHS  . lisinopril  5 mg Oral Daily  .  metoprolol tartrate  50 mg Oral BID  . mulitivitamin with minerals  1 tablet Oral Daily  . pantoprazole  40 mg Oral Q1200  . warfarin  3 mg Oral ONCE-1800  . warfarin  4 mg Oral ONCE-1800  . DISCONTD: enoxaparin (LOVENOX) injection  40 mg Subcutaneous Q24H  . DISCONTD: simvastatin  40 mg Oral QPM   Continuous Infusions:   . diltiazem (CARDIZEM) infusion 5 mg/hr (11/28/11 1933)     EDUCATION - test results and diagnostic studies were discussed with patient and pt's family who was present at the bedside - patient and family have verbalized the understanding - questions were answered at the bedside and contact information was provided for additional questions or concerns   LOS: 17 days   Thuy Atilano 11/29/2011, 12:48 PM  [facility] Pager: [phone]

## 2011-11-30 LAB — CBC
MCH: 28.9 pg (ref 26.0–34.0)
MCHC: 32.7 g/dL (ref 30.0–36.0)
RDW: 13.5 % (ref 11.5–15.5)

## 2011-11-30 LAB — BASIC METABOLIC PANEL
BUN: 14 mg/dL (ref 6–23)
Calcium: 9.6 mg/dL (ref 8.4–10.5)
GFR calc Af Amer: >90 mL/min (ref >90)
GFR calc non Af Amer: 81 mL/min — ABNORMAL LOW (ref >90)
Potassium: 4 mEq/L (ref 3.5–5.1)
Sodium: 137 mEq/L (ref 135–145)

## 2011-11-30 LAB — GLUCOSE, CAPILLARY
Glucose-Capillary: 158 mg/dL — ABNORMAL HIGH (ref 70–99)
Glucose-Capillary: 258 mg/dL — ABNORMAL HIGH (ref 70–99)
Glucose-Capillary: 104 mg/dL — ABNORMAL HIGH (ref 70–99)

## 2011-11-30 LAB — PROTIME-INR
INR: 2.81 — ABNORMAL HIGH (ref 0.00–1.49)
Prothrombin Time: 30 seconds — ABNORMAL HIGH (ref 11.6–15.2)

## 2011-11-30 MED ORDER — DIGOXIN 125 MCG PO TABS: 0.1250 mg | ORAL_TABLET | Freq: Every day | ORAL | Status: DC

## 2011-11-30 MED ORDER — WARFARIN SODIUM 2 MG PO TABS
2.0000 mg | ORAL_TABLET | Freq: Once | ORAL | Status: DC
  Filled 2011-11-30: qty 1

## 2011-11-30 MED ORDER — FENOFIBRATE 160 MG PO TABS: 160.0000 mg | ORAL_TABLET | Freq: Every day | ORAL | Status: DC

## 2011-11-30 MED ORDER — CIPROFLOXACIN HCL 500 MG PO TABS: 500.0000 mg | ORAL_TABLET | Freq: Two times a day (BID) | ORAL | Status: AC

## 2011-11-30 MED ORDER — ALPRAZOLAM 1 MG PO TABS: 1.0000 mg | ORAL_TABLET | Freq: Every evening | ORAL | Status: DC | PRN

## 2011-11-30 MED ORDER — GLIMEPIRIDE 4 MG PO TABS: 4.0000 mg | ORAL_TABLET | Freq: Every day | ORAL | Status: DC

## 2011-11-30 MED ORDER — LISINOPRIL 5 MG PO TABS: 5.0000 mg | ORAL_TABLET | Freq: Every day | ORAL | Status: DC

## 2011-11-30 MED ORDER — DILTIAZEM HCL ER COATED BEADS 120 MG PO CP24: 120.0000 mg | ORAL_CAPSULE | Freq: Every day | ORAL | Status: DC

## 2011-11-30 MED ORDER — HYOSCYAMINE SULFATE 0.125 MG PO TBDP: 0.1250 mg | ORAL_TABLET | ORAL | Status: DC | PRN

## 2011-11-30 MED ORDER — WARFARIN SODIUM 2 MG PO TABS: 4.0000 mg | ORAL_TABLET | Freq: Once | ORAL | Status: DC

## 2011-11-30 NOTE — Progress Notes (Addendum)
Physical Therapy Treatment (re-eval performed) Patient Details Name: [name] [name] MRN: [id number] DOB: 04-16-1930 Today's Date: 11/30/2011  PT Assessment/Plan  PT - Assessment/Plan Comments on Treatment Session: Goals re-assessed today.  Updated as needed.  D/c recs remain appropriate.  The patient is doing better with mobility.  Able to walk further today, but is still very deconditioned and fatigued at the end of her walk.  She required multiple standing rest breaks to be able to continue walking.   PT Plan: Discharge plan remains appropriate;Frequency remains appropriate PT Frequency: Min 3X/week Recommendations for Other Services: OT consult Follow Up Recommendations: Home health PT Equipment Recommended: None recommended by PT PT Goals  Acute Rehab PT Goals Pt will go Supine/Side to Sit: with modified independence;with HOB 0 degrees PT Goal: Supine/Side to Sit - Progress: Updated due to goal met Pt will go Sit to Supine/Side: with modified independence;with HOB 0 degrees PT Goal: Sit to Supine/Side - Progress: Updated due to goal met PT Goal: Sit to Stand - Progress: Progressing toward goal PT Goal: Stand to Sit - Progress: Progressing toward goal PT Goal: Ambulate - Progress: Progressing toward goal  PT Treatment Precautions/Restrictions  Precautions Precautions: Fall Required Braces or Orthoses: No Restrictions Weight Bearing Restrictions: No Other Position/Activity Restrictions: desaturates Mobility (including Balance) Bed Mobility Supine to Sit: 6: Modified independent (Device/Increase time);HOB elevated (Comment degrees);With rails (HOB 30 degrees) Sitting - Scoot to Edge of Bed: 6: Modified independent (Device/Increase time) Sit to Supine: 6: Modified independent (Device/Increase time);With rail;HOB elevated (comment degrees) (30 degrees) Transfers Sit to Stand: 4: Min assist Sit to Stand Details (indicate cue type and reason): min guard assist to steady patient  for balance upon first standing.   Stand to Sit: 4: Min assist Stand to Sit Details: min guard assist for balance.   Ambulation/Gait Ambulation/Gait Assistance: 4: Min assist Ambulation/Gait Assistance Details (indicate cue type and reason): Patient holding to PT and to hallway rail.  3 standing rest breaks while walking.  Patient with staggering gait pattern.   Ambulation Distance (Feet): 150 Feet Assistive device: 1 person hand held assist Gait Pattern: Step-through pattern;Shuffle Gait velocity: <1.8 ft/sec which indicated risk for recurrent falls.      End of Session PT - End of Session Activity Tolerance: Patient limited by fatigue Patient left: in bed;with call bell in reach;with family/visitor present (husband in room) General Behavior During Session: [facility] for tasks performed Cognition: [facility] for tasks performed  [name] [name] B. Annabelle Rexroad, PT, DPT [phone] 11/30/2011, 11:09 AM

## 2011-11-30 NOTE — Progress Notes (Signed)
ANTICOAGULATION CONSULT NOTE - Follow Up Consult  Pharmacy Consult for Coumadin Indication: atrial fibrillation  Allergies  Allergen Reactions  . Lactose Intolerance (Gi)     Nausea and vomiting, severe stomach pain  . Penicillins Hives and Itching  . Sulfa Antibiotics Hives and Itching    Patient Measurements: Height: 5' (152.4 cm) Weight: 127 lb 6.4 oz (57.788 kg) (Scale A) IBW/kg (Calculated) : 45.5   Vital Signs: Temp: 98.9 F (37.2 C) (02/25 0512) Temp src: Oral (02/25 0512) BP: 117/69 mmHg (02/25 1014) Pulse Rate: 98  (02/25 1057)  Labs:  Basename 11/30/11 0715 11/29/11 0514 11/28/11 0140 11/28/11 0123 11/27/11 1629  HGB 10.5* 9.6* -- -- --  HCT 32.1* 30.1* 32.1* -- --  PLT 419* 446* 515* -- --  APTT -- -- -- -- --  LABPROT 30.0* 23.6* 22.3* -- --  INR 2.81* 2.06* 1.92* -- --  HEPARINUNFRC -- -- -- -- --  CREATININE 0.69 0.72 0.73 -- --  CKTOTAL -- -- -- 22 19  CKMB -- -- -- 1.6 1.7  TROPONINI -- -- -- <0.30 <0.30   Estimated Creatinine Clearance: 45.4 ml/min (by C-G formula based on Cr of 0.69).  Assessment: 78 year old female s/p ERCP on 2/8 then decompensated 2/14 with AFib.  INR today 2.81. INR is therapeutic after  4 mg, 4 mg, 3 mg and 4mg  since 2/21. D#3/5 Cipro-can increase coumadin effect.  (Primaxin d/c'd 2/23, completed course).  CBC stable. No bleeding reported.   Goal of Therapy:  INR 2-3   Plan:    Coumadin 2 mg PO tonight.   Continue daily PT/INR.   Will discontinue Lovenox for DVT ppx as INR >2.   Arman Filter , RPh 11/30/2011,11:55 AM

## 2011-11-30 NOTE — Discharge Instructions (Signed)
Acute Pancreatitis The pancreas is a large gland located behind your stomach. It produces (secretes) enzymes. These enzymes help digest food. It also releases the hormones glucagon and insulin. These hormones help regulate blood sugar. When the pancreas becomes inflamed, the disease is called pancreatitis. Inflammation of the pancreas occurs when enzymes from the pancreas begin attacking and digesting the pancreas. CAUSES  Most cases ofsudden onset (acute) pancreatitis are caused by:  Alcohol abuse.   Gallstones.  Other less common causes are:  Some medications.   Exposure to certain chemicals   Infection.   Damage caused by an accident (trauma).   Surgery of the belly (abdomen).  SYMPTOMS  Acute pancreatitis usually begins with pain in the upper abdomen and may radiate to the back. This pain may last a couple days. The constant pain varies from mild to severe. The acute form of this disease may vary from mild, nonspecific abdominal pain to profound shock with coma. About 1 in 5 cases are severe. These patients become dehydrated and develop low blood pressure. In severe cases, bleeding into the pancreas can lead to shock and death. The lungs, heart, and kidneys may fail. DIAGNOSIS  Your caregiver will form a clinical opinion after giving you an exam. Laboratory work is used to confirm this diagnosis. Often,a digestive enzyme from the pancreas (serum amylase) and other enzymes are elevated. Sugars and fats (lipids) in the blood may be elevated. There may also be changes in the following levels: calcium, magnesium, potassium, chloride and bicarbonate (chemicals in the blood). X-rays, a CT scan, or ultrasound of your abdomen may be necessary to search for other causes of your abdominal pain. TREATMENT  Most pancreatitis requires treatment of symptoms. Most acute attacks last a couple of days. Your caregiver can discuss the treatment options with you.  If complications occur, hospitalization  may be necessary for pain control and intravenous (IV) fluid replacement.   Sometimes, a tube may be put into the stomach to control vomiting.   Food may not be allowed for 3 to 4 days. This gives the pancreas time to rest. Giving the pancreas a rest means there is no stimulation that would produce more enzymes and cause more damage.   Medicines (antibiotics) that kill germs may be given if infection is the cause.   Sometimes, surgery may be required.   Following an acute attack, your caregiver will determine the cause, if possible, and offer suggestions to prevent recurrences.  HOME CARE INSTRUCTIONS   Eat smaller, more frequent meals. This reduces the amount of digestive juices the pancreas produces.   Decrease the amount of fat in your diet. This may help reduce loose, diarrheal stools.   Drink enough water and fluids to keep your urine clear or pale yellow. This is to avoid dehydration which can cause increased pain.   Talk to your caregiver about pain relievers or other medicines that may help.   Avoid anything that may have triggered your pancreatitis (for example, alcohol).   Follow the diet advised by your caregiver. Do not advance the diet too soon.   Take medicines as prescribed.   Get plenty of rest.   Check your blood sugar at home as directed by your caregiver.   If your caregiver has given you a follow-up appointment, it is very important to keep that appointment. Not keeping the appointment could result in a lasting (chronic) or permanent injury, pain, and disability. If there is any problem keeping the appointment, you must call to reschedule.    SEEK MEDICAL CARE IF:   You are not recovering in the time described by your caregiver.   You have persistent pain, weakness, or feel sick to your stomach (nauseous).   You have recovered and then have another bout of pain.  SEEK IMMEDIATE MEDICAL CARE IF:   You are unable to eat or keep fluids down.   Your pain  increases a lot or changes.   You have an oral temperature above 102 F (38.9 C), not controlled by medicine.   Your skin or the white part of your eyes look yellow (jaundice).   You develop vomiting.   You feel dizzy or faint.   Your blood sugar is high (over 300).  MAKE SURE YOU:   Understand these instructions.   Will watch your condition.   Will get help right away if you are not doing well or get worse.  Document Released: 09/21/2005 Document Revised: 06/03/2011 Document Reviewed: 05/04/2008 Exit[employer] Patient Information 2012 [employer]. 

## 2011-11-30 NOTE — Progress Notes (Signed)
HOME HEALTH AGENCIES SERVING [place]   Agencies that are Medicare-Certified and are affiliated with [facility]  Telephone Number Address  [facility]   The [facility] has ownership interest in this company; however, you are under no obligation to use this agency. [phone] or  [phone] [address] [place], [place] [address]   Agencies that are Medicare-Certified and are not affiliated with [facility]                                                                                 Home Health Agency Telephone Number Address  [facility] [phone] Fax [phone] [address] [place], [place]  [address]  [facility] [phone] or [phone] Fax [phone] [address] [place], [place] [address]  [facility] [phone] Fax [phone] [address] [place], [place] [address]  [facility] [phone] Fax [phone] [address], [address] [place], [place]  [address]  [facility] [phone] Fax [phone] [address], [address] [place], [place] [address]  [facility] [phone] [address] [place], [place] [address]  [facility] [phone]  [address] [place], [place] [address]  [facility] [phone] or [phone] Fax [phone] [address] [place], [place]  [address]  [facility] [phone] Fax [phone] [address] [place], [place]  [address]  [facility]  [phone] Fax [phone] [address] [place], [place] [address]               Agencies that are not Medicare-Certified and are not affiliated with [facility]   Home Health Agency Telephone Number Address  [facility] [phone] or [phone] Fax [phone] [address] [place], [place]  [address]  [facility] [phone] Fax [phone] [address] [place], [place]  [address]  [facility]  [phone] Fax [phone] [address] [place], [place] [address]  [facility] [phone] Fax [phone] [address] , [place] [address]  [facility] [phone] or [phone] Fax [phone] [address] [place], [place]  [address]  [facility] [phone] or [phone] Fax [phone] [address] [place], [place]  [address]  [facility] [phone] Fax [phone] [address] [place], [place]  [address]  [facility] [phone] [address] [place], [place]  [address]  [facility] [phone] Fax [phone] [address] [place], [place]  [address]  [facility] [phone] Fax [phone] [address] [place], [place]  [address]  [facility] [phone] Fax [phone] [address] [place], [place] [address]  [facility] [phone] Fax [phone] [address] , [place]  [address]   

## 2011-11-30 NOTE — Progress Notes (Signed)
0845 unable to ambulate this time r/t abdominal pain . Was medica ted nad repositioned for comfort. Will consider later when pain free and able

## 2011-11-30 NOTE — Progress Notes (Signed)
   CARE MANAGEMENT NOTE 11/30/2011  Patient:  [name] [name], [name] [name]   Account Number:  [id number]  Date Initiated:  11/13/2011  Documentation initiated by:  Donn Pierini  Subjective/Objective Assessment:   Pt admitted with pancreatitis to have ERCP done     Action/Plan:   PTA pt lived at home, was independent with ADLs  pt eval- recs hhpt   Anticipated DC Date:  11/27/2011   Anticipated DC Plan:  HOME W HOME HEALTH SERVICES      DC Planning Services  CM consult      [facility] Choice  HOME HEALTH   Choice offered to / List presented to:  C-1 Patient           Status of service:  In process, will continue to follow Medicare Important Message given?   (If response is "NO", the following Medicare IM given date fields will be blank) Date Medicare IM given:   Date Additional Medicare IM given:    Discharge Disposition:    Per UR Regulation:    Comments:  02/25013 [name] RN,CCM Spoke with patient and spouse regarding home health needs and PT recommendations. Discussed with patient and spouse selection of home care agency, they did not indicate a choice. CM provided patient and spouse with a list of home health agencies for [facility]. Spouse states they have a hospital type bed, raised toilet seat and commode chair at home. Patient states she would need assistance with bathing.  CM will notify MD. Patient states prior to hospital admission seen by Dr. Charleston Ropes in [facility].   11/23/11 15:21 [name] RN, BSN [phone] patient  is with elevated wbc, temp, on cardizem drip still for afib, patient is very deconditioned.  NCM will continue to follow for dc needs.  11/18/11 16:48 [name] RN, BSN [phone] patient is s/p ERCP, pt is slow to progress, physical therapy recs hhpt.  11/13/11- 1700- Donn Pierini RN, BSN- [phone] Pt to have an ERCP, CM to follow for d/c needs

## 2011-11-30 NOTE — Progress Notes (Signed)
Utilization review complete 

## 2011-11-30 NOTE — Progress Notes (Signed)
1300 ambulated with physical therapist

## 2011-11-30 NOTE — Progress Notes (Signed)
   CARE MANAGEMENT NOTE 11/30/2011  Patient:  [name] [name], [name] [name]   Account Number:  [id number]  Date Initiated:  11/13/2011  Documentation initiated by:  [name] [name]  Subjective/Objective Assessment:   Pt admitted with pancreatitis to have ERCP done     Action/Plan:   PTA pt lived at home, was independent with ADLs  pt eval- recs hhpt   Anticipated DC Date:  11/27/2011   Anticipated DC Plan:  HOME W HOME HEALTH SERVICES      DC Planning Services  CM consult      [facility] Choice  HOME HEALTH   Choice offered to / List presented to:  C-1 Patient        HH arranged  HH-2 PT  HH-4 NURSE'S AIDE      HH agency  [facility]   Status of service:  Completed, signed off Medicare Important Message given?   (If response is "NO", the following Medicare IM given date fields will be blank) Date Medicare IM given:   Date Additional Medicare IM given:    Discharge Disposition:  HOME W HOME HEALTH SERVICES  Per UR Regulation:    Comments:  11/30/2011  [name] Russian RN,CCM 2:30 pm  MD order for home health services PT and aide. Spoke with patient regarding order for PT and aide. Patient reviewed listiing of home health agencies and selected [facility]. Referral made to [facility] via [facility].  Call to [facility] care rep with the referral for PT and aide.   02/25013 [name] Russian RN,CCM Spoke with patient and spouse regarding home health needs and PT recommendations. Discussed with patient and spouse selection of home care agency, they did not indicate a choice. CM provided patient and spouse with a list of home health agencies for [facility]. Spouse states they have a hospital type bed, raised toilet seat and commode chair at home. Patient states she would need assistance with bathing.  CM will notify MD. Patient states prior to hospital admission seen by [name] [name] in [facility].   11/23/11 15:21 [name] Cape RN, BSN [phone] patient  is  with elevated wbc, temp, on cardizem drip still for afib, patient is very deconditioned.  NCM will continue to follow for dc needs.  11/18/11 16:48 [name] Cape RN, BSN [phone] patient is s/p ERCP, pt is slow to progress, physical therapy recs hhpt.  11/13/11- 1700- [name] Pierini RN, BSN- [phone] Pt to have an ERCP, CM to follow for d/c needs

## 2011-11-30 NOTE — Progress Notes (Signed)
1625 discharge instructions and prescriptions given to pt and spouse . Both verbalized understanding. D/ c to home via wheelchair to lobby

## 2011-11-30 NOTE — Discharge Summary (Signed)
Patient ID: [name] [name] MRN: [id number] DOB/AGE: [birth year] 78 y.o.  Admit date: 11/12/2011 Discharge date: 11/30/2011  Primary Care Physician:  No primary provider on file.  Assessment/Plan:   Principal Problem:   *Fever, Leukcytosis:  *Patient decompensated on 2/14 AM with A-Fib, Fever, worsening abdominal pain.  *CT scan 2/14 unchanged, significant pancreatitis, Dr. Elnoria Howard following post ERCP.  *CXR showed infiltrates vs. Edema -she responded very well to IV lasix (early in the hospitalization received significant volume resuscitation because of pancreatitis)  *Primaxin discontinued and ciprofloxacin by mouth started today 11/28/2011 to be continued for total of 5 days (last day 12/02/2011)  Active Problems:   A-Fib, probably paroxysmal, although seems to be persistent at this point, setting of acute pancreatitis:  *Has been loaded with digoxin and weaned off of IV Cardizem that ventricular rates between 60 and 90 however cardizem drip has been restarted on 11/27/11 after which HR improved and stayed within the normal limits so the patient was transitioned to PO cardizem *Digoxin level low so will increase dose from 0.625 to 0.125  *CHADS2 risk score is 3 so she will likely need anticoagulation if this persists either with rivaroxiban or warfarin.  *coumadin per pharmacy - INR therapeutic   Pulmonary Infiltrate/Pulmonary edema:  *Clinically less Volume overloaded based on recent x-ray and clinical exam  *Presented with fever and leukocytosis - initially presumed to have HCAP but CXR no definitive pneumonia- more c/w atelectasis  *Primaxin Discontinued and cipro PO started (3/5 days)   Pancreatitis s/p ERCP  *Tolerating diet advance but with marginal intake  *Primary management of pancreatitis per GI/Dr. Elnoria Howard - signed off   Vaginal Yeast Infection:  *Secondary to antibiotic use. Treated with oral Fluconazole.   Hyponatremia/hypokalemia  *Hypokalemia likely related to volume  shifting as well as recent use of Lasix  *Sodium continues to trend back up after discontinuation of Lasix to be lead recent hyponatremia related to volume depletion  *No evidence of orthostasis with orthostatic vital signs checked  *replete and follow up BMP in am   Deconditioning  *Continue PT and OT evaluations and mobilize as tolerated  *Anticipate discharge today   Medication List  As of 11/30/2011  1:42 PM   TAKE these medications         acetaminophen 500 MG tablet   Commonly known as: TYLENOL   Take 500 mg by mouth every 6 (six) hours as needed. Patient used this medication for pain.      ALPRAZolam 1 MG tablet   Commonly known as: XANAX   Take 1 tablet (1 mg total) by mouth at bedtime as needed.      CENTRUM SILVER PO   Take 1 tablet by mouth daily.      ciprofloxacin 500 MG tablet   Commonly known as: CIPRO   Take 1 tablet (500 mg total) by mouth 2 (two) times daily.      digoxin 0.125 MG tablet   Commonly known as: LANOXIN   Take 1 tablet (0.125 mg total) by mouth daily.      diltiazem 120 MG 24 hr capsule   Commonly known as: CARDIZEM CD   Take 1 capsule (120 mg total) by mouth daily.      fenofibrate 160 MG tablet   Take 1 tablet (160 mg total) by mouth daily.      glimepiride 4 MG tablet   Commonly known as: AMARYL   Take 1 tablet (4 mg total) by mouth daily before breakfast.  hyoscyamine 0.125 MG Tbdp   Commonly known as: ANASPAZ   Place 1 tablet (0.125 mg total) under the tongue every 4 (four) hours as needed (for abdominal cramps).      LACTASE PO   Take 1 tablet by mouth daily.      lisinopril 5 MG tablet   Commonly known as: PRINIVIL,ZESTRIL   Take 1 tablet (5 mg total) by mouth daily.      methylphenidate 5 MG tablet   Commonly known as: RITALIN   Take 5 mg by mouth 2 (two) times daily.      omeprazole 20 MG capsule   Commonly known as: PRILOSEC   Take 20 mg by mouth daily.      simvastatin 40 MG tablet   Commonly known as: ZOCOR     Take 40 mg by mouth every evening.      warfarin 2 MG tablet   Commonly known as: COUMADIN   Take 2 tablets (4 mg total) by mouth one time only at 6 PM.      XALATAN OP   Apply 1 drop to eye once. Patient uses this medication in each eye at night.            Disposition and Follow-up: - follow up with [facility] in 1 week  Consults:   1. Gastroenterology 2. cardioology  Significant Diagnostic Studies:  Dg Chest 2 View  11/12/2011   IMPRESSION: .1.  No acute disease    Mr 3d Recon At Scanner 11/13/2011    IMPRESSION:  1.  Mild diffuse biliary ductal dilatation with common bile duct measuring 12 mm in diameter. 2.  Probable 7 mm distal common bile duct stone impacted at the ampulla.  Consider ERCP for further evaluation. 2.  No evidence of pancreatitis, pancreatic mass, or other acute findings.   Dg Ercp With Sphincterotomy 11/13/2011    IMPRESSION: Sphincterotomy and balloon passage.  No definite common bile duct stones.  These images were submitted for radiologic interpretation only. Please see the procedural report for the amount of contrast and the fluoroscopy time utilized.    Dg Abd Portable 2v 11/13/2011    IMPRESSION:  1.  Contrast following procedure. 2.  No evidence for free air. 3. The findings were discussed with Dr. Elnoria Howard on 11/13/2011 at 5:55 p.m.   Mr Abd W/wo Cm/mrcp  11/13/2011  IMPRESSION:  1.  Mild diffuse biliary ductal dilatation with common bile duct measuring 12 mm in diameter. 2.  Probable 7 mm distal common bile duct stone impacted at the ampulla.  Consider ERCP for further evaluation. 2.  No evidence of pancreatitis, pancreatic mass, or other acute findings.   Brief H and P: 78 y.o. female with history of diabetes, hypertension, hypercholesterolemia, prior pancreatitis 4 years ago, status post cholecystectomy, presents to [facility] complaining of epigastric pain and bilateral flank pain for 2 days. She denied any fever, chills, shortness of breath or  chest pain. She did have some nausea and vomited twice in the emergency room of bilious material. She denied any new medication or any alcohol use. Evaluation in emergency room included a lipase of 622, creatinine of 1.2, blood glucose of 190. Her urinalysis is strongly positive for urinary tract infection with 21-50 WBC, many bacteria, and positive nitrite. Her liver function tests was also elevated. Hospitalist was asked to accept patient in transfer for acute pancreatitis and UTI.   Physical Exam on Discharge:  Filed Vitals:   11/30/11 1610 11/30/11 0757 11/30/11  1014 11/30/11 1057  BP: 117/66 117/69 117/69   Pulse: 81  100 98  Temp: 98.9 F (37.2 C)     TempSrc: Oral     Resp: 18 20    Height:      Weight: 57.788 kg (127 lb 6.4 oz)     SpO2: 99% 100%       Intake/Output Summary (Last 24 hours) at 11/30/11 1342 Last data filed at 11/30/11 1340  Gross per 24 hour  Intake    640 ml  Output   1225 ml  Net   -585 ml    General: Alert, awake, oriented x3, in no acute distress. HEENT: No bruits, no goiter. Heart: Regular rate and rhythm, without murmurs, rubs, gallops. Lungs: Clear to auscultation bilaterally. Abdomen: Soft, nontender, nondistended, positive bowel sounds. Extremities: No clubbing cyanosis or edema with positive pedal pulses. Neuro: Grossly intact, nonfocal.  CBC:    Component Value Date/Time   WBC 11.7* 11/30/2011 0715   HGB 10.5* 11/30/2011 0715   HCT 32.1* 11/30/2011 0715   PLT 419* 11/30/2011 0715   MCV 88.4 11/30/2011 0715   NEUTROABS 6.2 07/24/2008 1045   LYMPHSABS 1.3 07/24/2008 1045   MONOABS 0.3 07/24/2008 1045   EOSABS 0.1 07/24/2008 1045   BASOSABS 0.0 07/24/2008 1045    Basic Metabolic Panel:    Component Value Date/Time   NA 137 11/30/2011 0715   K 4.0 11/30/2011 0715   CL 103 11/30/2011 0715   CO2 26 11/30/2011 0715   BUN 14 11/30/2011 0715   CREATININE 0.69 11/30/2011 0715   GLUCOSE 174* 11/30/2011 0715   CALCIUM 9.6 11/30/2011 0715    Time  spent on Discharge: Greater than 30 minutes  Signed: Antonieta Slaven 11/30/2011, 1:42 PM

## 2011-12-04 DIAGNOSIS — J189 Pneumonia, unspecified organism: Secondary | ICD-10-CM

## 2011-12-04 HISTORY — DX: Pneumonia, unspecified organism: J18.9

## 2011-12-11 ENCOUNTER — Other Ambulatory Visit: Payer: Self-pay

## 2011-12-11 ENCOUNTER — Encounter (HOSPITAL_COMMUNITY): Payer: Self-pay | Admitting: Emergency Medicine

## 2011-12-11 ENCOUNTER — Inpatient Hospital Stay (HOSPITAL_COMMUNITY)
Admission: EM | Admit: 2011-12-11 | Discharge: 2011-12-20 | DRG: 439 | Disposition: A | Discharge status: Home / Self Care | Payer: Medicare Other | Attending: Internal Medicine | Admitting: Internal Medicine

## 2011-12-11 DIAGNOSIS — I4891 Unspecified atrial fibrillation: Secondary | ICD-10-CM | POA: Diagnosis present

## 2011-12-11 DIAGNOSIS — H409 Unspecified glaucoma: Secondary | ICD-10-CM | POA: Diagnosis present

## 2011-12-11 DIAGNOSIS — K922 Gastrointestinal hemorrhage, unspecified: Secondary | ICD-10-CM | POA: Diagnosis present

## 2011-12-11 DIAGNOSIS — I10 Essential (primary) hypertension: Secondary | ICD-10-CM | POA: Diagnosis present

## 2011-12-11 DIAGNOSIS — D689 Coagulation defect, unspecified: Secondary | ICD-10-CM

## 2011-12-11 DIAGNOSIS — E871 Hypo-osmolality and hyponatremia: Secondary | ICD-10-CM | POA: Diagnosis present

## 2011-12-11 DIAGNOSIS — R5381 Other malaise: Secondary | ICD-10-CM | POA: Diagnosis present

## 2011-12-11 DIAGNOSIS — E561 Deficiency of vitamin K: Secondary | ICD-10-CM | POA: Diagnosis present

## 2011-12-11 DIAGNOSIS — K862 Cyst of pancreas: Principal | ICD-10-CM | POA: Diagnosis present

## 2011-12-11 DIAGNOSIS — D649 Anemia, unspecified: Secondary | ICD-10-CM | POA: Diagnosis present

## 2011-12-11 DIAGNOSIS — E46 Unspecified protein-calorie malnutrition: Secondary | ICD-10-CM | POA: Diagnosis present

## 2011-12-11 DIAGNOSIS — T45515A Adverse effect of anticoagulants, initial encounter: Secondary | ICD-10-CM | POA: Diagnosis present

## 2011-12-11 DIAGNOSIS — E119 Type 2 diabetes mellitus without complications: Secondary | ICD-10-CM

## 2011-12-11 DIAGNOSIS — D72829 Elevated white blood cell count, unspecified: Secondary | ICD-10-CM | POA: Diagnosis present

## 2011-12-11 DIAGNOSIS — K219 Gastro-esophageal reflux disease without esophagitis: Secondary | ICD-10-CM | POA: Diagnosis present

## 2011-12-11 DIAGNOSIS — K859 Acute pancreatitis without necrosis or infection, unspecified: Secondary | ICD-10-CM | POA: Diagnosis present

## 2011-12-11 DIAGNOSIS — R791 Abnormal coagulation profile: Secondary | ICD-10-CM | POA: Diagnosis present

## 2011-12-11 DIAGNOSIS — K863 Pseudocyst of pancreas: Principal | ICD-10-CM | POA: Diagnosis present

## 2011-12-11 HISTORY — DX: Unspecified atrial fibrillation: I48.91

## 2011-12-11 LAB — DIFFERENTIAL
Lymphocytes Relative: 21 % (ref 12–46)
Lymphs Abs: 2.2 10*3/uL (ref 0.7–4.0)
Monocytes Relative: 6 % (ref 3–12)
Neutro Abs: 7.6 10*3/uL (ref 1.7–7.7)
Neutrophils Relative %: 71 % (ref 43–77)

## 2011-12-11 LAB — COMPREHENSIVE METABOLIC PANEL
AST: 32 U/L (ref 0–37)
BUN: 18 mg/dL (ref 6–23)
CO2: 21 mEq/L (ref 19–32)
Calcium: 9.4 mg/dL (ref 8.4–10.5)
Creatinine, Ser: 0.93 mg/dL (ref 0.50–1.10)
GFR calc Af Amer: 66 mL/min — ABNORMAL LOW (ref >90)
GFR calc non Af Amer: 57 mL/min — ABNORMAL LOW (ref >90)
Glucose, Bld: 176 mg/dL — ABNORMAL HIGH (ref 70–99)

## 2011-12-11 LAB — PROTIME-INR
INR: >10 (ref 0.00–1.49)
Prothrombin Time: >90 seconds — ABNORMAL HIGH (ref 11.6–15.2)

## 2011-12-11 LAB — CBC
Hemoglobin: 9.4 g/dL — ABNORMAL LOW (ref 12.0–15.0)
Platelets: 410 10*3/uL — ABNORMAL HIGH (ref 150–400)
RBC: 3.31 MIL/uL — ABNORMAL LOW (ref 3.87–5.11)
WBC: 10.7 10*3/uL — ABNORMAL HIGH (ref 4.0–10.5)

## 2011-12-11 LAB — LIPASE, BLOOD: Lipase: 83 U/L — ABNORMAL HIGH (ref 11–59)

## 2011-12-11 MED ORDER — SODIUM CHLORIDE 0.9 % IV BOLUS (SEPSIS)
500.0000 mL | Freq: Once | INTRAVENOUS | Status: AC
  Administered 2011-12-11: 500 mL via INTRAVENOUS

## 2011-12-11 MED ORDER — VITAMIN K1 10 MG/ML IJ SOLN
10.0000 mg | Freq: Once | INTRAVENOUS | Status: AC
  Administered 2011-12-12: 10 mg via INTRAVENOUS
  Filled 2011-12-11: qty 1

## 2011-12-11 MED ORDER — HYDROMORPHONE HCL PF 1 MG/ML IJ SOLN
1.0000 mg | Freq: Once | INTRAMUSCULAR | Status: AC
  Administered 2011-12-11: 1 mg via INTRAVENOUS
  Filled 2011-12-11: qty 1

## 2011-12-11 NOTE — ED Notes (Signed)
Per EMS; pt was recently in [facility] for pancreatitis, pt on coumadin, HHRN called pt and told her PT/INR was elevated and needed to come to hospital; vss 130/70 BP, 70 irregular

## 2011-12-11 NOTE — ED Provider Notes (Signed)
History     CSN: [id number]  Arrival date & time 12/11/11  2109   First MD Initiated Contact with Patient 12/11/11 2137      Chief Complaint  Patient presents with  . Coagulation Disorder    (Consider location/radiation/quality/duration/timing/severity/associated sxs/prior treatment) HPI Comments: 78 year old female recently admitted for biliary pancreatitis presenting at the request of her home health nurse and was found to have abnormal coagulation profile. Patient does not know what was wrong with the coagulation profile. She was admitted in February for pancreatitis and found during this admission of paroxysmal atrial fibrillation for which Coumadin was started. She was also noted to have a urinary tract infection which was treated with Cipro.  Patient is a 78 y.o. female presenting with abdominal pain. The history is provided by the patient and the spouse.  Abdominal Pain The primary symptoms of the illness include abdominal pain, nausea and diarrhea (chronic.). The primary symptoms of the illness do not include shortness of breath, vomiting or dysuria. The current episode started more than 2 days ago (several weeks ago). The onset of the illness was gradual. The problem has not changed since onset. The patient has not had a change in bowel habit (chronic loose stools). Additional symptoms associated with the illness include back pain (pain radiates to back). Symptoms associated with the illness do not include chills, constipation or urgency. Associated medical issues comments: recent diagnosis of biliary pancreatitis. .    Past Medical History  Diagnosis Date  . Pancreatitis   . Hypertension   . Diabetes mellitus   . Glaucoma   . High cholesterol   . GERD (gastroesophageal reflux disease)   . H/O hiatal hernia   . Atrial fibrillation     Past Surgical History  Procedure Date  . Appendectomy   . Abdominal hysterectomy   . Cholecystectomy   . Ercp 11/13/2011    Procedure:  ENDOSCOPIC RETROGRADE CHOLANGIOPANCREATOGRAPHY (ERCP);  Surgeon: Theda Belfast, MD;  Location: [facility] ENDOSCOPY;  Service: Endoscopy;  Laterality: N/A;    Family History  Problem Relation Age of Onset  . Anesthesia problems Neg Hx   . Hypotension Neg Hx   . Malignant hyperthermia Neg Hx   . Pseudochol deficiency Neg Hx     History  Substance Use Topics  . Smoking status: Never Smoker   . Smokeless tobacco: Never Used  . Alcohol Use: No    OB History    Grav Para Term Preterm Abortions TAB SAB Ect Mult Living                  Review of Systems  Constitutional: Negative for chills.  Respiratory: Negative for shortness of breath.   Cardiovascular: Negative for chest pain.  Gastrointestinal: Positive for nausea, abdominal pain and diarrhea (chronic.). Negative for vomiting, constipation and blood in stool.  Genitourinary: Negative for dysuria and urgency.  Musculoskeletal: Positive for back pain (pain radiates to back).  Neurological: Negative for weakness, numbness and headaches.  All other systems reviewed and are negative.    Allergies  Lactose intolerance (gi); Penicillins; and Sulfa antibiotics  Home Medications   Current Outpatient Rx  Name Route Sig Dispense Refill  . ACETAMINOPHEN 500 MG PO TABS Oral Take 500 mg by mouth every 6 (six) hours as needed. for pain.    Marland Kitchen ALPRAZOLAM 1 MG PO TABS Oral Take 1 tablet (1 mg total) by mouth at bedtime as needed. 30 tablet 0  . DIGOXIN 0.125 MG PO TABS Oral Take 1  tablet (0.125 mg total) by mouth daily. 30 tablet 0  . DILTIAZEM HCL ER COATED BEADS 120 MG PO CP24 Oral Take 1 capsule (120 mg total) by mouth daily. 30 capsule 0  . FENOFIBRATE 160 MG PO TABS Oral Take 1 tablet (160 mg total) by mouth daily. 30 tablet 0  . GLIMEPIRIDE 4 MG PO TABS Oral Take 4 mg by mouth 2 (two) times daily.    Marland Kitchen HYOSCYAMINE SULFATE 0.125 MG PO TBDP Sublingual Place 1 tablet (0.125 mg total) under the tongue every 4 (four) hours as needed (for  abdominal cramps). 45 tablet 0  . LACTASE PO Oral Take 1 tablet by mouth daily.    Marland Kitchen XALATAN OP Ophthalmic Apply 1 drop to eye at bedtime.     Marland Kitchen LISINOPRIL 5 MG PO TABS Oral Take 1 tablet (5 mg total) by mouth daily. 30 tablet 0  . METHYLPHENIDATE HCL 5 MG PO TABS Oral Take 5 mg by mouth 2 (two) times daily.    . CENTRUM SILVER PO Oral Take 1 tablet by mouth daily.    Marland Kitchen OMEPRAZOLE 20 MG PO CPDR Oral Take 20 mg by mouth 2 (two) times daily.     Marland Kitchen SIMVASTATIN 40 MG PO TABS Oral Take 40 mg by mouth every evening.    . WARFARIN SODIUM 2 MG PO TABS Oral Take 2 tablets (4 mg total) by mouth one time only at 6 PM. 30 tablet 0    Please follow up INR in clinic with PCP to continu ...  . CIPROFLOXACIN HCL 500 MG PO TABS Oral Take 1 tablet (500 mg total) by mouth 2 (two) times daily. 4 tablet 0    BP 115/51  Pulse 105  Temp(Src) 99.2 F (37.3 C) (Oral)  Resp 15  SpO2 97%  Physical Exam  Nursing note and vitals reviewed. Constitutional: She is oriented to person, place, and time. She appears well-developed. No distress.       Patient appears chronically ill, weak.  HENT:  Head: Normocephalic and atraumatic.  Mouth/Throat: Oropharynx is clear and moist.  Eyes: EOM are normal. Pupils are equal, round, and reactive to light.  Neck: Normal range of motion. Neck supple.  Cardiovascular: Regular rhythm and normal heart sounds.  Exam reveals no friction rub.   No murmur heard.      Tachycardic with irregularly irregular rhythm.  Pulmonary/Chest: Effort normal and breath sounds normal. No respiratory distress. She has no wheezes. She has no rales.  Abdominal: Soft. There is tenderness (mild right upper quadrant tenderness to palpation.). There is no rebound and no guarding.  Genitourinary: Guaiac positive stool.       Small amount of bright red blood on rectal exam. No masses noted.   Musculoskeletal: Normal range of motion. She exhibits no edema and no tenderness.  Lymphadenopathy:    She has no  cervical adenopathy.  Neurological: She is alert and oriented to person, place, and time.  Skin: Skin is warm and dry. No rash noted. There is pallor.  Psychiatric: She has a normal mood and affect. Her behavior is normal.    ED Course  Procedures (including critical care time)  Results for orders placed during the hospital encounter of 12/11/11  COMPREHENSIVE METABOLIC PANEL      Component Value Range   Sodium 133 (*) 135 - 145 (mEq/L)   Potassium 4.5  3.5 - 5.1 (mEq/L)   Chloride 99  96 - 112 (mEq/L)   CO2 21  19 - 32 (  mEq/L)   Glucose, Bld 176 (*) 70 - 99 (mg/dL)   BUN 18  6 - 23 (mg/dL)   Creatinine, Ser 1.61  0.50 - 1.10 (mg/dL)   Calcium 9.4  8.4 - 09.6 (mg/dL)   Total Protein 7.1  6.0 - 8.3 (g/dL)   Albumin 2.6 (*) 3.5 - 5.2 (g/dL)   AST 32  0 - 37 (U/L)   ALT 16  0 - 35 (U/L)   Alkaline Phosphatase 58  39 - 117 (U/L)   Total Bilirubin 0.3  0.3 - 1.2 (mg/dL)   GFR calc non Af Amer 57 (*) >90 (mL/min)   GFR calc Af Amer 66 (*) >90 (mL/min)  CBC      Component Value Range   WBC 10.7 (*) 4.0 - 10.5 (K/uL)   RBC 3.31 (*) 3.87 - 5.11 (MIL/uL)   Hemoglobin 9.4 (*) 12.0 - 15.0 (g/dL)   HCT 04.5 (*) 40.9 - 46.0 (%)   MCV 85.8  78.0 - 100.0 (fL)   MCH 28.4  26.0 - 34.0 (pg)   MCHC 33.1  30.0 - 36.0 (g/dL)   RDW 81.1  91.4 - 78.2 (%)   Platelets 410 (*) 150 - 400 (K/uL)  DIFFERENTIAL      Component Value Range   Neutrophils Relative 71  43 - 77 (%)   Neutro Abs 7.6  1.7 - 7.7 (K/uL)   Lymphocytes Relative 21  12 - 46 (%)   Lymphs Abs 2.2  0.7 - 4.0 (K/uL)   Monocytes Relative 6  3 - 12 (%)   Monocytes Absolute 0.6  0.1 - 1.0 (K/uL)   Eosinophils Relative 2  0 - 5 (%)   Eosinophils Absolute 0.2  0.0 - 0.7 (K/uL)   Basophils Relative 0  0 - 1 (%)   Basophils Absolute 0.0  0.0 - 0.1 (K/uL)  PROTIME-INR      Component Value Range   Prothrombin Time >90.0 (*) 11.6 - 15.2 (seconds)   INR >10.00 (*) 0.00 - 1.49   LIPASE, BLOOD      Component Value Range   Lipase 83 (*)  11 - 59 (U/L)       1. Coagulopathy   2. GI bleed       MDM  31:95 PM 78 year old female recently admitted from 2/7-2/25 with biliary pancreatitis presenting at the request of her home health nurse due to abnormal coagulation profile. Patient was started on Coumadin due to paroxysmal atrial fibrillation during her hospitalization. The patient had an ERCP done on 2/8 by GI which revealed a dilated common bile duct but no stones. She was also noted during his hospitalization to have a UTI was treated with Cipro. The patient states since discharge she has continued to have epigastric and right upper quadrant abdominal pain which is unchanged. She's also been weak and fatigued. She denies any blood in her stool or headache. She endorses chronic loose stools but denies any blood in her stool. Her neurologic exam is nonfocal. She has mild right upper quadrant tenderness. She is tachycardic to 100 and vitals were stable. We'll check CBC, CMP, lipase, coagulation profile and fluid hydrate.  11:56 PM INR noted to be greater than 10. The patient noted to drop 1 g of hemoglobin since 2/25. Rectal exam reveals gross bright red blood. Her heart rate has ranged from 80s to low 100s. Blood pressures have remained stable. We'll give 1 unit of FFP and 10 mg of vitamin K IV. Medicine has been consulted  for admission to step down unit.        Sheran Luz, MD 12/12/11 [phone]

## 2011-12-11 NOTE — ED Notes (Signed)
Pt to ED for eval of elevated PT/INR; pt reports that she was recently in hospital for pancreatitis, has been sent home, with [facility] and HHPT; HHRN called pt today with results from PT/INR and told husband INR >10 and told to come to ED; pt reports that she still has abd pain; pt a&OX4, NAD;

## 2011-12-12 ENCOUNTER — Encounter (HOSPITAL_COMMUNITY): Payer: Self-pay | Admitting: *Deleted

## 2011-12-12 DIAGNOSIS — K922 Gastrointestinal hemorrhage, unspecified: Secondary | ICD-10-CM | POA: Diagnosis present

## 2011-12-12 DIAGNOSIS — D689 Coagulation defect, unspecified: Secondary | ICD-10-CM | POA: Diagnosis present

## 2011-12-12 DIAGNOSIS — I4891 Unspecified atrial fibrillation: Secondary | ICD-10-CM | POA: Diagnosis present

## 2011-12-12 DIAGNOSIS — D649 Anemia, unspecified: Secondary | ICD-10-CM | POA: Diagnosis present

## 2011-12-12 LAB — CBC
HCT: 26.7 % — ABNORMAL LOW (ref 36.0–46.0)
Hemoglobin: 8.8 g/dL — ABNORMAL LOW (ref 12.0–15.0)
MCH: 28.4 pg (ref 26.0–34.0)
MCHC: 33 g/dL (ref 30.0–36.0)
MCV: 85.3 fL (ref 78.0–100.0)
Platelets: 394 10*3/uL (ref 150–400)
RBC: 3.1 MIL/uL — ABNORMAL LOW (ref 3.87–5.11)
RDW: 13.8 % (ref 11.5–15.5)
WBC: 10.9 10*3/uL — ABNORMAL HIGH (ref 4.0–10.5)

## 2011-12-12 LAB — PROTIME-INR
INR: 2.19 — ABNORMAL HIGH (ref 0.00–1.49)
Prothrombin Time: 24.7 seconds — ABNORMAL HIGH (ref 11.6–15.2)

## 2011-12-12 LAB — TYPE AND SCREEN: Antibody Screen: NEGATIVE

## 2011-12-12 LAB — GLUCOSE, CAPILLARY: Glucose-Capillary: 53 mg/dL — ABNORMAL LOW (ref 70–99)

## 2011-12-12 LAB — ABO/RH: ABO/RH(D): A POS

## 2011-12-12 LAB — MRSA PCR SCREENING: MRSA by PCR: NEGATIVE

## 2011-12-12 MED ORDER — LATANOPROST 0.005 % OP SOLN
1.0000 [drp] | Freq: Every day | OPHTHALMIC | Status: DC
  Administered 2011-12-12 – 2011-12-19 (×8): 1 [drp] via OPHTHALMIC
  Filled 2011-12-12: qty 2.5

## 2011-12-12 MED ORDER — SODIUM CHLORIDE 0.9 % IJ SOLN
3.0000 mL | Freq: Two times a day (BID) | INTRAMUSCULAR | Status: DC
  Administered 2011-12-12: 3 mL via INTRAVENOUS

## 2011-12-12 MED ORDER — PANTOPRAZOLE SODIUM 40 MG IV SOLR
40.0000 mg | Freq: Two times a day (BID) | INTRAVENOUS | Status: DC
  Administered 2011-12-12 – 2011-12-14 (×5): 40 mg via INTRAVENOUS
  Filled 2011-12-12 (×6): qty 40

## 2011-12-12 MED ORDER — WARFARIN - PHARMACIST DOSING INPATIENT: Freq: Every day | Status: DC

## 2011-12-12 MED ORDER — FENOFIBRATE 160 MG PO TABS
160.0000 mg | ORAL_TABLET | Freq: Every day | ORAL | Status: DC
  Administered 2011-12-12 – 2011-12-20 (×9): 160 mg via ORAL
  Filled 2011-12-12 (×9): qty 1

## 2011-12-12 MED ORDER — ONDANSETRON HCL 4 MG PO TABS
4.0000 mg | ORAL_TABLET | Freq: Four times a day (QID) | ORAL | Status: DC | PRN
  Administered 2011-12-18: 4 mg via ORAL
  Filled 2011-12-12 (×2): qty 1

## 2011-12-12 MED ORDER — GLIMEPIRIDE 4 MG PO TABS
4.0000 mg | ORAL_TABLET | Freq: Two times a day (BID) | ORAL | Status: DC
  Administered 2011-12-12: 4 mg via ORAL
  Filled 2011-12-12 (×3): qty 1

## 2011-12-12 MED ORDER — DIGOXIN 125 MCG PO TABS
0.1250 mg | ORAL_TABLET | Freq: Every day | ORAL | Status: DC
  Administered 2011-12-12 – 2011-12-14 (×3): 0.125 mg via ORAL
  Filled 2011-12-12 (×3): qty 1

## 2011-12-12 MED ORDER — ATORVASTATIN CALCIUM 20 MG PO TABS
20.0000 mg | ORAL_TABLET | Freq: Every day | ORAL | Status: DC
  Administered 2011-12-12 – 2011-12-19 (×8): 20 mg via ORAL
  Filled 2011-12-12 (×9): qty 1

## 2011-12-12 MED ORDER — HYOSCYAMINE SULFATE 0.125 MG PO TBDP
0.1250 mg | ORAL_TABLET | ORAL | Status: DC | PRN
  Administered 2011-12-12: 0.125 mg via SUBLINGUAL
  Filled 2011-12-12: qty 1

## 2011-12-12 MED ORDER — SIMVASTATIN 40 MG PO TABS: 40.0000 mg | ORAL_TABLET | Freq: Every evening | ORAL | Status: DC

## 2011-12-12 MED ORDER — ACETAMINOPHEN 650 MG RE SUPP: 650.0000 mg | Freq: Four times a day (QID) | RECTAL | Status: DC | PRN

## 2011-12-12 MED ORDER — ONDANSETRON HCL 4 MG/2ML IJ SOLN
4.0000 mg | Freq: Four times a day (QID) | INTRAMUSCULAR | Status: DC | PRN
  Administered 2011-12-19: 4 mg via INTRAVENOUS
  Filled 2011-12-12 (×2): qty 2

## 2011-12-12 MED ORDER — SODIUM CHLORIDE 0.9 % IV SOLN
INTRAVENOUS | Status: DC
  Administered 2011-12-12: 16:00:00 via INTRAVENOUS

## 2011-12-12 MED ORDER — DILTIAZEM HCL ER COATED BEADS 120 MG PO CP24
120.0000 mg | ORAL_CAPSULE | Freq: Every day | ORAL | Status: DC
  Administered 2011-12-12 – 2011-12-20 (×9): 120 mg via ORAL
  Filled 2011-12-12 (×9): qty 1

## 2011-12-12 MED ORDER — INSULIN GLARGINE 100 UNIT/ML ~~LOC~~ SOLN
10.0000 [IU] | Freq: Every day | SUBCUTANEOUS | Status: DC
  Filled 2011-12-12: qty 3

## 2011-12-12 MED ORDER — INSULIN ASPART 100 UNIT/ML ~~LOC~~ SOLN
0.0000 [IU] | Freq: Three times a day (TID) | SUBCUTANEOUS | Status: DC
  Administered 2011-12-13: 8 [IU] via SUBCUTANEOUS
  Administered 2011-12-13: 3 [IU] via SUBCUTANEOUS
  Administered 2011-12-14: 5 [IU] via SUBCUTANEOUS
  Administered 2011-12-14 – 2011-12-15 (×3): 2 [IU] via SUBCUTANEOUS
  Administered 2011-12-16: 1 [IU] via SUBCUTANEOUS
  Administered 2011-12-16: 3 [IU] via SUBCUTANEOUS
  Administered 2011-12-17: 2 [IU] via SUBCUTANEOUS
  Administered 2011-12-17 – 2011-12-19 (×3): 3 [IU] via SUBCUTANEOUS
  Administered 2011-12-19 – 2011-12-20 (×3): 2 [IU] via SUBCUTANEOUS
  Filled 2011-12-12: qty 0.15
  Filled 2011-12-12: qty 3

## 2011-12-12 MED ORDER — ACETAMINOPHEN 325 MG PO TABS
650.0000 mg | ORAL_TABLET | Freq: Four times a day (QID) | ORAL | Status: DC | PRN
  Administered 2011-12-14: 650 mg via ORAL
  Administered 2011-12-15: 325 mg via ORAL
  Administered 2011-12-18 – 2011-12-20 (×3): 650 mg via ORAL
  Filled 2011-12-12: qty 1
  Filled 2011-12-12 (×4): qty 2

## 2011-12-12 MED ORDER — METHYLPHENIDATE HCL 5 MG PO TABS
5.0000 mg | ORAL_TABLET | Freq: Two times a day (BID) | ORAL | Status: DC
  Administered 2011-12-12 – 2011-12-18 (×12): 5 mg via ORAL
  Filled 2011-12-12 (×12): qty 1

## 2011-12-12 MED ORDER — DEXTROSE-NACL 5-0.9 % IV SOLN
INTRAVENOUS | Status: DC
  Administered 2011-12-12 – 2011-12-13 (×2): via INTRAVENOUS

## 2011-12-12 MED ORDER — INSULIN ASPART 100 UNIT/ML ~~LOC~~ SOLN
0.0000 [IU] | Freq: Every day | SUBCUTANEOUS | Status: DC
  Administered 2011-12-13: 3 [IU] via SUBCUTANEOUS

## 2011-12-12 MED ORDER — SODIUM CHLORIDE 0.9 % IV SOLN
INTRAVENOUS | Status: DC
  Administered 2011-12-12: 04:00:00 via INTRAVENOUS

## 2011-12-12 MED ORDER — LISINOPRIL 5 MG PO TABS
5.0000 mg | ORAL_TABLET | Freq: Every day | ORAL | Status: DC
  Administered 2011-12-12 – 2011-12-20 (×9): 5 mg via ORAL
  Filled 2011-12-12 (×9): qty 1

## 2011-12-12 NOTE — H&P (Signed)
PCP:   No primary provider on file.  Not able to elaborate this  Chief Complaint:  Elevated INR   HPI: [name] with h/o DM/HTN, prior pancreatitis several years ago, and  recent admission for repeat pancreatitis now presents with INR > 10  and minimal drop in Hgb.   Pt was last admitted to [facility]  2/7-2/25 for pancreatitis, had ERCP  with Dr. Elnoria Howard. During hospitalization had worsened AFib, fevers, and  was treated wtih primaxin which was switched to ciprofloxacin. She  was loaded with Digoxin and weaned off IV cardizem, onto PO  Cardizem.   Pt not giving great history at present bc she was just woken up and  is drowsy, but does answer questions appropriately when awoken. She  states that overall her abdominal pain and pancreatitis symptoms  have been improving, but her home health nurse checked her INR, and  per discussion with ED staff, this was why she was sent in as it was  high. She states she had dark looking stools but can't tell me when,  and denies any bright red blood at any time.   In the ED vitals were stable except minimal tachy to 105. Labs with  minimal hypoNa 133, lipase 83, negative LFT's. CBC with WBC 10.7  actually decreasing over time. Hgb now 9.4 down from 10.5 on 2/25,  and plts 410. INR was > 10. Rectal exam showed bright red blood per  rectum. Pt has been given 10 mg IV VitK and 500 cc NS, 1u FFP.   She has had some nausea and some abd pain, but not severe and  overall better. Not having any cardiopulmonary symptoms at present.  ROS otherwise not obtainable or unremarkable.   Past Medical History  Diagnosis Date  . Pancreatitis     ~2008 and again in 11/2011 s/p ERCP  . Hypertension   . Diabetes mellitus   . Glaucoma   . High cholesterol   . GERD (gastroesophageal reflux disease)   . H/O hiatal hernia   . Atrial fibrillation     Coumadin    Past Surgical History  Procedure Date  . Appendectomy   . Abdominal hysterectomy   . Cholecystectomy     . Ercp 11/13/2011    Procedure: ENDOSCOPIC RETROGRADE CHOLANGIOPANCREATOGRAPHY (ERCP);  Surgeon: Theda Belfast, MD;  Location: [facility] ENDOSCOPY;  Service: Endoscopy;  Laterality: N/A;    Medications:  HOME MEDS: These were not reconcilable at present by me  Prior to Admission medications   Medication Sig Start Date End Date Taking? Authorizing Provider  acetaminophen (TYLENOL) 500 MG tablet Take 500 mg by mouth every 6 (six) hours as needed. for pain.   Yes Historical Provider, MD  ALPRAZolam Prudy Feeler) 1 MG tablet Take 1 tablet (1 mg total) by mouth at bedtime as needed. 11/30/11  Yes Alison Murray, MD  digoxin (LANOXIN) 0.125 MG tablet Take 1 tablet (0.125 mg total) by mouth daily. 11/30/11 11/29/12 Yes Alison Murray, MD  diltiazem (CARDIZEM CD) 120 MG 24 hr capsule Take 1 capsule (120 mg total) by mouth daily. 11/30/11 11/29/12 Yes Alison Murray, MD  fenofibrate 160 MG tablet Take 1 tablet (160 mg total) by mouth daily. 11/30/11  Yes Alison Murray, MD  glimepiride (AMARYL) 4 MG tablet Take 4 mg by mouth 2 (two) times daily.   Yes Historical Provider, MD  hyoscyamine (ANASPAZ) 0.125 MG TBDP Place 1 tablet (0.125 mg total) under the tongue every 4 (four) hours as needed (for  abdominal cramps). 11/30/11  Yes Alison Murray, MD  LACTASE PO Take 1 tablet by mouth daily.   Yes Historical Provider, MD  Latanoprost (XALATAN OP) Apply 1 drop to eye at bedtime.    Yes Historical Provider, MD  lisinopril (PRINIVIL,ZESTRIL) 5 MG tablet Take 1 tablet (5 mg total) by mouth daily. 11/30/11  Yes Alison Murray, MD  methylphenidate (RITALIN) 5 MG tablet Take 5 mg by mouth 2 (two) times daily.   Yes Historical Provider, MD  Multiple Vitamins-Minerals (CENTRUM SILVER PO) Take 1 tablet by mouth daily.   Yes Historical Provider, MD  omeprazole (PRILOSEC) 20 MG capsule Take 20 mg by mouth 2 (two) times daily.    Yes Historical Provider, MD  simvastatin (ZOCOR) 40 MG tablet Take 40 mg by mouth every evening.   Yes Historical  Provider, MD  warfarin (COUMADIN) 2 MG tablet Take 2 tablets (4 mg total) by mouth one time only at 6 PM. 11/30/11 11/29/12 Yes Alison Murray, MD   Allergies:  Allergies  Allergen Reactions  . Lactose Intolerance (Gi) Nausea And Vomiting    severe stomach pain  . Penicillins Hives and Itching  . Sulfa Antibiotics Hives and Itching    Social History:   reports that she has never smoked. She has never used smokeless tobacco. She reports that she does not drink alcohol or use illicit drugs.  Family History: Family History  Problem Relation Age of Onset  . Anesthesia problems Neg Hx   . Hypotension Neg Hx   . Malignant hyperthermia Neg Hx   . Pseudochol deficiency Neg Hx     Physical Exam: Filed Vitals:   12/12/11 0315 12/12/11 0320 12/12/11 0330 12/12/11 0401  BP: 119/54  112/48   Pulse: 85 88 89   Temp:  97.8 F (36.6 C)  98.4 F (36.9 C)  TempSrc:  Oral  Oral  Resp: 16 17 14    Height:      Weight:      SpO2:       Blood pressure 112/48, pulse 89, temperature 98.4 F (36.9 C), temperature source Oral, resp. rate 14, height 5' (1.524 m), weight 56.1 kg (123 lb 10.9 oz), SpO2 94.00%.  Gen: Elderly but still moderately robust appearing F in no distress,  sleeping but awoken to voice and drowsy appearing due to early am  hour, but conversant and able to relate some history, no distress,  suspect she is just sleepy and not encephalopathic. Her answers do  make sense and are coherent. Breathing comfortably, no increased WOB  or accessory muscle use HEENT: Pupils round and reactive, some arcus senilis noted, [place] in  place, mouth a bit dry appearing Lungs: Bibasilar light crackles that cleared with repeated breaths,  rest of exam fairly normal without wheezes Heart: Grossly irregular and wihtout m/g Abd: Overweight, but soft non tender, minimal facial grimacing to  palpation, soft and not rigid or peritoneal, overall fairly benign Extrem: Good bulk for her age, normal tone,  no BLE edema, warm and  not cool or cyanotic Neuro: Drowsy but attentive to conversation and appropriate, Cn 2-12  grossly intact, moves extremities on her own, grossly non-focal    Labs & Imaging Results for orders placed during the hospital encounter of 12/11/11 (from the past 48 hour(s))  COMPREHENSIVE METABOLIC PANEL     Status: Abnormal   Collection Time   12/11/11 10:10 PM      Component Value Range Comment   Sodium 133 (*) 135 -  145 (mEq/L)    Potassium 4.5  3.5 - 5.1 (mEq/L)    Chloride 99  96 - 112 (mEq/L)    CO2 21  19 - 32 (mEq/L)    Glucose, Bld 176 (*) 70 - 99 (mg/dL)    BUN 18  6 - 23 (mg/dL)    Creatinine, Ser 6.96  0.50 - 1.10 (mg/dL)    Calcium 9.4  8.4 - 10.5 (mg/dL)    Total Protein 7.1  6.0 - 8.3 (g/dL)    Albumin 2.6 (*) 3.5 - 5.2 (g/dL)    AST 32  0 - 37 (U/L) HEMOLYSIS AT THIS LEVEL MAY AFFECT RESULT   ALT 16  0 - 35 (U/L)    Alkaline Phosphatase 58  39 - 117 (U/L)    Total Bilirubin 0.3  0.3 - 1.2 (mg/dL)    GFR calc non Af Amer 57 (*) >90 (mL/min)    GFR calc Af Amer 66 (*) >90 (mL/min)   CBC     Status: Abnormal   Collection Time   12/11/11 10:10 PM      Component Value Range Comment   WBC 10.7 (*) 4.0 - 10.5 (K/uL)    RBC 3.31 (*) 3.87 - 5.11 (MIL/uL)    Hemoglobin 9.4 (*) 12.0 - 15.0 (g/dL)    HCT 29.5 (*) 28.4 - 46.0 (%)    MCV 85.8  78.0 - 100.0 (fL)    MCH 28.4  26.0 - 34.0 (pg)    MCHC 33.1  30.0 - 36.0 (g/dL)    RDW 13.2  44.0 - 10.2 (%)    Platelets 410 (*) 150 - 400 (K/uL)   DIFFERENTIAL     Status: Normal   Collection Time   12/11/11 10:10 PM      Component Value Range Comment   Neutrophils Relative 71  43 - 77 (%)    Neutro Abs 7.6  1.7 - 7.7 (K/uL)    Lymphocytes Relative 21  12 - 46 (%)    Lymphs Abs 2.2  0.7 - 4.0 (K/uL)    Monocytes Relative 6  3 - 12 (%)    Monocytes Absolute 0.6  0.1 - 1.0 (K/uL)    Eosinophils Relative 2  0 - 5 (%)    Eosinophils Absolute 0.2  0.0 - 0.7 (K/uL)    Basophils Relative 0  0 - 1 (%)    Basophils  Absolute 0.0  0.0 - 0.1 (K/uL)   PROTIME-INR     Status: Abnormal   Collection Time   12/11/11 10:10 PM      Component Value Range Comment   Prothrombin Time >90.0 (*) 11.6 - 15.2 (seconds)    INR >10.00 (*) 0.00 - 1.49    LIPASE, BLOOD     Status: Abnormal   Collection Time   12/11/11 10:10 PM      Component Value Range Comment   Lipase 83 (*) 11 - 59 (U/L)   PREPARE FRESH FROZEN PLASMA     Status: Normal (Preliminary result)   Collection Time   12/12/11 12:00 AM      Component Value Range Comment   Unit Number [id number]      Blood Component Type THAWED PLASMA      Unit division 00      Status of Unit ISSUED      Transfusion Status OK TO TRANSFUSE     TYPE AND SCREEN     Status: Normal   Collection Time   12/12/11 12:05 AM  Component Value Range Comment   ABO/RH(D) A POS      Antibody Screen NEG      Sample Expiration 12/15/2011     ABO/RH     Status: Normal   Collection Time   12/12/11 12:05 AM      Component Value Range Comment   ABO/RH(D) A POS      No results found.  ECG: Afib without RVR, normal axis, narrow QRS, no ST deviations,  V5-6 TWI, otherwise overall fairly unremarkable. In comparison to  prior, TWI in lateral leads appears new.   Impression Present on Admission:  .Coagulopathy .Anemia .GI bleed .Pancreatitis .DM (diabetes mellitus) .Atrial fibrillation .Leukocytosis  [name] with h/o DM/HTN, prior pancreatitis several years ago, and  recent admission for repeat pancreatitis now presents with INR > 10  and minimal drop in Hgb.   1. Supratherapeutic INR: with Hgb drop of 1. She has been given 1u  FFP and 10 mg IV VitK. Pt states she had dark stool recently but not  wholly clear, and ED exam showed bright red blood, so not really  sure the source. This may be a diffuse ooze from INR >10 and may not  necessitate EGD/colonoscopy, so for overnight will monitor, trend  Hct and INR, consider GI consultation. Will give protonix boluses in  case upper source,  but don't feel overwhelmed by her story at  present for full drip.   - Trend INR, holding coumadin for now. Clear liquids for now. [place]  all stools, trend Hct. Consider GI consult depending on her  progress.   - S/p 500 cc bolus and maintenance running now, given stable  hemodynamics will hold on further IVF's to not dilute Hgb more.   2. Afib: Rate is in the low 100's which is tolerable for now. Continue  home digoxin, diltiazem, and holding coumadin, restart when appropriate  3. Recent pancreatitis: Lipase downtrending as is leukocytosis, overall  she states symptoms are better, so this is not active issue  4. Diabetes: Sugars well controlled at present, will continue oral meds  and monitor CBG's without insulin for now   5. Continue home fenofibrate, lisinopril, ritalin, statin. Holding all  other non essential meds.   SDU, MC team 9 Presumed full code  Other plans as per orders.  Ranier Coach 12/12/2011, 4:02 AM

## 2011-12-12 NOTE — Progress Notes (Signed)
PHARMACY - ANTICOAGULATION CONSULT INITIAL NOTE  Pharmacy Consult for: Coumadin  Indication: atrial fibrillation    Patient Data:   Allergies: Allergies  Allergen Reactions  . Lactose Intolerance (Gi) Nausea And Vomiting    severe stomach pain  . Penicillins Hives and Itching  . Sulfa Antibiotics Hives and Itching    Patient Measurements: Height: 5' (152.4 cm) Weight: 123 lb 10.9 oz (56.1 kg) IBW/kg (Calculated) : 45.5   Vital Signs: Temp:  [97.6 F (36.4 C)-99.2 F (37.3 C)] 98.4 F (36.9 C) (03/09 0401) Pulse Rate:  [74-137] 89  (03/09 0330) Resp:  [14-18] 14  (03/09 0330) BP: (101-134)/(48-60) 112/48 mmHg (03/09 0330) SpO2:  [94 %-97 %] 94 % (03/09 0300) Weight:  [123 lb 10.9 oz (56.1 kg)] 123 lb 10.9 oz (56.1 kg) (03/09 0255)  Intake/Output from previous day:  Intake/Output Summary (Last 24 hours) at 12/12/11 0412 Last data filed at 12/12/11 0320  Gross per 24 hour  Intake    307 ml  Output      0 ml  Net    307 ml    Labs:  Basename 12/11/11 2210  HGB 9.4*  HCT 28.4*  PLT 410*  APTT --  LABPROT >90.0*  INR >10.00*  HEPARINUNFRC --  CREATININE 0.93  CKTOTAL --  CKMB --  TROPONINI --   Estimated Creatinine Clearance: 38.5 ml/min (by C-G formula based on Cr of 0.93).  Medical History: Past Medical History  Diagnosis Date  . Pancreatitis     ~2008 and again in 11/2011 s/p ERCP  . Hypertension   . Diabetes mellitus   . Glaucoma   . High cholesterol   . GERD (gastroesophageal reflux disease)   . H/O hiatal hernia   . Atrial fibrillation     Coumadin    Scheduled medications:     . sodium chloride   Intravenous STAT  . digoxin  0.125 mg Oral Daily  . diltiazem  120 mg Oral Daily  . fenofibrate  160 mg Oral Daily  . glimepiride  4 mg Oral BID  .  HYDROmorphone (DILAUDID) injection  1 mg Intravenous Once  . lisinopril  5 mg Oral Daily  . methylphenidate  5 mg Oral BID  . pantoprazole (PROTONIX) IV  40 mg Intravenous Q12H  .  phytonadione (VITAMIN K) IV  10 mg Intravenous Once  . simvastatin  40 mg Oral QPM  . sodium chloride  500 mL Intravenous Once  . sodium chloride  3 mL Intravenous Q12H     Assessment:  78 y.o. female with h/o atrial fibrillation admitted on 12/11/2011 with INR > 10. Pharmacy consulted to manage Coumadin. Patient has received 10mg  IV phytonadione and 1 unit FFP.   Goal of Therapy:  1. INR 2-3  Plan:  1.  Hold Coumadin.  2. Follow-up AM INR.   Dineen Kid Thad Ranger, PharmD 12/12/2011, 4:12 AM

## 2011-12-12 NOTE — ED Notes (Signed)
EDP spoke with family/pt re: administering FFPs; afterwards I went and spoke with family, and showed consent paperwork; answered pt/family questions; pt verbalized understanding and gave verbal consent, however pt continuing to fall asleep during conversation, and pt is drowsy; husband signed consent on pt's behalf

## 2011-12-12 NOTE — Progress Notes (Signed)
[facility] [place] TEAM 1 - Stepdown/ICU TEAM  Subjective: [name] with h/o DM/HTN, prior pancreatitis several years ago, and recent admission for repeat pancreatitis now presents with INR > 10 and minimal drop in Hgb. Pt was [facility] for pancreatitis, had ERCP with Dr. Elnoria Howard. During hospitalization had worsened AFib, fevers, and was treated wtih primaxin which was switched to ciprofloxacin. She was loaded with Digoxin and weaned off IV cardizem, onto PO Cardizem. She states that overall her abdominal pain and pancreatitis symptoms have been improving, but her home health nurse checked her INR, and per discussion with ED staff, this was why she was sent in as it was  high.  Pt seen for a f/u visit.  Objective: Weight change:   Intake/Output Summary (Last 24 hours) at 12/12/11 1011 Last data filed at 12/12/11 0918  Gross per 24 hour  Intake 558.75 ml  Output      0 ml  Net 558.75 ml   Blood pressure 120/57, pulse 83, temperature 97.9 F (36.6 C), temperature source Oral, resp. rate 14, height 5' (1.524 m), weight 56.1 kg (123 lb 10.9 oz), SpO2 96.00%.  Physical Exam: F/u exam is completed  Lab Results:  Basename 12/11/11 2210  NA 133*  K 4.5  CL 99  CO2 21  GLUCOSE 176*  BUN 18  CREATININE 0.93  CALCIUM 9.4  MG --  PHOS --    Basename 12/11/11 2210  AST 32  ALT 16  ALKPHOS 58  BILITOT 0.3  PROT 7.1  ALBUMIN 2.6*    Basename 12/12/11 0600 12/11/11 2210  WBC 10.7* 10.7*  NEUTROABS -- 7.6  HGB 8.8* 9.4*  HCT 26.7* 28.4*  MCV 86.1 85.8  PLT 375 410*   Micro Results: Recent Results (from the past 240 hour(s))  MRSA PCR SCREENING     Status: Normal   Collection Time   12/12/11  2:45 AM      Component Value Range Status Comment   MRSA by PCR NEGATIVE  NEGATIVE  Final     Studies/Results: All recent x-ray/radiology reports have been reviewed in detail.   Medications: I have reviewed the patient's complete medication  list.  Assessment/Plan:  Supratherapeutic INR: INR has now been corrected to therapeutic range w/ Vit K and FFP  Anemia: Hgb has been slowly drifting down - hgb at time of recent d/c (11/29/11) was 9.6 - no evidence of acute signif bleeding source - follow trend to r/o occult bleeding (RPH)  Afib:  Hyponatremia:  Recent pancreatitis:  Diabetes: Not at goal - follow w/o change today   Lonia Blood, MD [facility] Office  [phone] Pager [phone]  On-Call/Text Page:      [email or web address]      password [facility]

## 2011-12-12 NOTE — ED Provider Notes (Signed)
I saw and evaluated the patient, reviewed the resident's note and I agree with the findings and plan.   Goerge Mohr A. Patrica Duel, MD 12/12/11 1610

## 2011-12-12 NOTE — Evaluation (Signed)
Physical Therapy Evaluation Patient Details Name: Callee Rohrig MRN: [id number] DOB: 1929-12-22 Today's Date: 12/12/2011  Problem List:  Patient Active Problem List  Diagnoses  . Pancreatitis  . UTI (lower urinary tract infection)  . DM (diabetes mellitus)  . HTN (hypertension)  . Hypercholesteremia  . Glaucoma (increased eye pressure)  . Hyponatremia  . Fever  . Leukocytosis  . HCAP (healthcare-associated pneumonia)  . Hypokalemia  . Atrial fibrillation  . Coagulopathy  . Anemia  . GI bleed    Past Medical History:  Past Medical History  Diagnosis Date  . Pancreatitis     ~2008 and again in 11/2011 s/p ERCP  . Hypertension   . Diabetes mellitus   . Glaucoma   . High cholesterol   . GERD (gastroesophageal reflux disease)   . H/O hiatal hernia   . Atrial fibrillation     Coumadin   Past Surgical History:  Past Surgical History  Procedure Date  . Appendectomy   . Abdominal hysterectomy   . Cholecystectomy   . Ercp 11/13/2011    Procedure: ENDOSCOPIC RETROGRADE CHOLANGIOPANCREATOGRAPHY (ERCP);  Surgeon: Theda Belfast, MD;  Location: [facility] ENDOSCOPY;  Service: Endoscopy;  Laterality: N/A;    PT Assessment/Plan/Recommendation PT Assessment Clinical Impression Statement: Patient admitted with elevated INR >10.  Patient at fall risk as she is very unsteady on feet.  Pt. has been struggling and needing assist for some time now per patietn and husband.  They both feel ok with d/c home as PTA with HHPT, HHRN and HH aide.  Have equipment. PT Recommendation/Assessment: Patient will need skilled PT in the acute care venue PT Problem List: Decreased strength;Decreased balance;Decreased mobility;Decreased activity tolerance;Decreased knowledge of use of DME;Decreased safety awareness;Decreased knowledge of precautions PT Therapy Diagnosis : Difficulty walking;Generalized weakness PT Plan PT Frequency: Min 3X/week PT Treatment/Interventions: DME instruction;Gait  training;Functional mobility training;Therapeutic activities;Therapeutic exercise;Balance training;Patient/family education PT Recommendation Recommendations for Other Services: OT consult Follow Up Recommendations: Home health PT;Supervision/Assistance - 24 hour Equipment Recommended: None recommended by PT PT Goals  Acute Rehab PT Goals PT Goal Formulation: With patient Time For Goal Achievement: 2 weeks Pt will go Supine/Side to Sit: with modified independence PT Goal: Supine/Side to Sit - Progress: Goal set today Pt will go Sit to Supine/Side: with modified independence PT Goal: Sit to Supine/Side - Progress: Goal set today Pt will go Sit to Stand: with supervision;with upper extremity assist PT Goal: Sit to Stand - Progress: Goal set today Pt will go Stand to Sit: with supervision;with upper extremity assist PT Goal: Stand to Sit - Progress: Goal set today Pt will Ambulate: 51 - 150 feet;with supervision;with least restrictive assistive device PT Goal: Ambulate - Progress: Goal set today Pt will Perform Home Exercise Program: with supervision, verbal cues required/provided PT Goal: Perform Home Exercise Program - Progress: Goal set today  PT Evaluation Precautions/Restrictions  Precautions Precautions: Fall Required Braces or Orthoses: No Restrictions Weight Bearing Restrictions: No Prior Functioning  Home Living Lives With: Spouse (24 hour care) Receives Help From: Family Type of Home: House Home Layout: One level Home Access: Ramped entrance Bathroom Shower/Tub: Engineer, manufacturing systems: Handicapped height Bathroom Accessibility: Yes How Accessible: Accessible via walker Home Adaptive Equipment: Walker - four wheeled;Straight cane;Raised toilet seat with rails;Shower chair without back;Wheelchair - manual Additional Comments: aide 3x/wk; HHPT and RN Prior Function Level of Independence: Requires assistive device for independence;Needs assistance with  ADLs Bath: Maximal Toileting: Maximal Dressing: Maximal Grooming: Supervision/set-up Driving: Yes Vocation: Retired 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 Cognition Cognition Arousal/Alertness: Awake/alert Overall Cognitive Status: Appears within functional limits for tasks assessed Orientation Level: Oriented X4 Sensation/Coordination Sensation Light Touch: Appears Intact Stereognosis: Not tested Hot/Cold: Not tested Proprioception: Not tested Coordination Gross Motor Movements are Fluid and Coordinated: Yes Fine Motor Movements are Fluid and Coordinated: Yes Extremity Assessment RUE Assessment RUE Assessment: Within Functional Limits LUE Assessment LUE Assessment: Within Functional Limits RLE Assessment RLE Assessment: Within Functional Limits LLE Assessment LLE Assessment: Within Functional Limits Mobility (including Balance) Bed Mobility Supine to Sit: 1: +2 Total assist;Patient percentage (comment);HOB elevated (Comment degrees) (HOB 30 degrees; pt = 60%) Supine to Sit Details (indicate cue type and reason): Patient having difficulty with technique to EOB. Sitting - Scoot to Edge of Bed: 5: Supervision Sit to Supine: Not Tested (comment) Sit to Sidelying Left: Not Tested (comment) Transfers Sit to Stand: 1: +2 Total assist;Patient percentage (comment);From elevated surface;With upper extremity assist;From bed (pt = 60%) Sit to Stand Details (indicate cue type and reason): Patient needed min assist to stand with flexed posture at trunk, hips and knees.  Needed assist to steady patient once on feet with patient needing HHA of 2 for stabiltiy. Stand to Sit: 3: Mod assist;With upper extremity assist;With armrests;To chair/3-in-1 Stand to Sit Details: mod assist to balance and control descent Ambulation/Gait Ambulation/Gait Assistance: 1: +2 Total assist;Patient percentage (comment) (pt = 70%) Ambulation/Gait Assistance Details (indicate cue type and reason): Patient  with +2 HHA.  Flexed posture throughout.  Staggered gait with poor postural stability. Ambulation Distance (Feet): 25 Feet Assistive device: 2 person hand held assist Gait Pattern: Step-to pattern;Decreased stride length;Decreased step length - right;Decreased step length - left;Scissoring;Shuffle;Right flexed knee in stance;Left flexed knee in stance;Trunk flexed Stairs: No Wheelchair Mobility Wheelchair Mobility: No  Posture/Postural Control Posture/Postural Control: Postural limitations Postural Limitations: Flexed trunk, hips and knees with rounded shoulders. Static Standing Balance Static Standing - Balance Support: Bilateral upper extremity supported;During functional activity Static Standing - Level of Assistance: 1: +2 Total assist;Patient percentage (comment) (pt = 70%) Exercise  General Exercises - Lower Extremity Long Arc Quad: AROM;Both;10 reps;Seated Hip Flexion/Marching: AROM;Both;10 reps;Seated End of Session PT - End of Session Equipment Utilized During Treatment: Gait belt Activity Tolerance: Patient limited by fatigue Patient left: in chair;with call bell in reach;with family/visitor present Nurse Communication: Mobility status for transfers;Mobility status for ambulation General Behavior During Session: [facility] for tasks performed Cognition: [facility] for tasks performed  INGOLD,Dontaye Hur 12/12/2011, 2:12 PM  [facility] Acute Rehabilitation [phone] (pager)

## 2011-12-13 LAB — GLUCOSE, CAPILLARY
Glucose-Capillary: 104 mg/dL — ABNORMAL HIGH (ref 70–99)
Glucose-Capillary: 140 mg/dL — ABNORMAL HIGH (ref 70–99)
Glucose-Capillary: 156 mg/dL — ABNORMAL HIGH (ref 70–99)
Glucose-Capillary: 290 mg/dL — ABNORMAL HIGH (ref 70–99)

## 2011-12-13 LAB — CBC
MCH: 28.3 pg (ref 26.0–34.0)
MCHC: 33.1 g/dL (ref 30.0–36.0)
MCV: 85.7 fL (ref 78.0–100.0)
Platelets: 379 10*3/uL (ref 150–400)
RDW: 14 % (ref 11.5–15.5)
WBC: 8.8 10*3/uL (ref 4.0–10.5)

## 2011-12-13 LAB — BASIC METABOLIC PANEL
BUN: 12 mg/dL (ref 6–23)
Calcium: 8.7 mg/dL (ref 8.4–10.5)
Creatinine, Ser: 0.84 mg/dL (ref 0.50–1.10)
GFR calc non Af Amer: 64 mL/min — ABNORMAL LOW (ref >90)
Glucose, Bld: 253 mg/dL — ABNORMAL HIGH (ref 70–99)

## 2011-12-13 LAB — PREPARE FRESH FROZEN PLASMA

## 2011-12-13 MED ORDER — SODIUM CHLORIDE 0.9 % IV SOLN
INTRAVENOUS | Status: DC
  Administered 2011-12-13: 16:00:00 via INTRAVENOUS

## 2011-12-13 MED ORDER — ALPRAZOLAM 0.5 MG PO TABS
0.5000 mg | ORAL_TABLET | Freq: Once | ORAL | Status: AC
  Administered 2011-12-13: 0.5 mg via ORAL
  Filled 2011-12-13: qty 1

## 2011-12-13 MED ORDER — HYOSCYAMINE SULFATE 0.125 MG PO TABS
0.1250 mg | ORAL_TABLET | ORAL | Status: DC | PRN
  Administered 2011-12-13: 0.125 mg via ORAL
  Filled 2011-12-13 (×3): qty 1

## 2011-12-13 MED ORDER — INSULIN GLARGINE 100 UNIT/ML ~~LOC~~ SOLN
14.0000 [IU] | Freq: Every day | SUBCUTANEOUS | Status: DC
  Administered 2011-12-13 – 2011-12-19 (×7): 14 [IU] via SUBCUTANEOUS

## 2011-12-13 MED ORDER — OXYCODONE HCL 5 MG PO TABS
5.0000 mg | ORAL_TABLET | ORAL | Status: DC | PRN
  Administered 2011-12-13 – 2011-12-17 (×6): 5 mg via ORAL
  Filled 2011-12-13 (×7): qty 1

## 2011-12-13 MED ORDER — WARFARIN SODIUM 2 MG PO TABS
2.0000 mg | ORAL_TABLET | Freq: Once | ORAL | Status: DC
  Filled 2011-12-13: qty 1

## 2011-12-13 NOTE — Progress Notes (Signed)
[facility] [place] TEAM 1 - Stepdown/ICU TEAM  Subjective: [name] with h/o DM/HTN, prior pancreatitis several years ago, and recent admission for repeat pancreatitis now presents with INR > 10 and minimal drop in Hgb. Pt was last admitted [facility] for pancreatitis, had ERCP with Dr. Elnoria Howard. During that hospitalization she also had worsened AFib, fevers, and was treated wtih primaxin which was switched to ciprofloxacin. She was loaded with Digoxin and weaned off IV cardizem, onto PO Cardizem. She states that overall her abdominal pain and pancreatitis symptoms have been improving, but her home health nurse checked her INR, and per discussion with ED staff, this was why she was sent in as it was high.  The pt is resting comfortably today.  She denies f/c, sob, n/v or abdom pain.  She is having frequent BMs (as per her usual "spastic colon") and has not noted any melena or BRBPR.  Objective: Weight change:   Intake/Output Summary (Last 24 hours) at 12/13/11 1516 Last data filed at 12/13/11 1400  Gross per 24 hour  Intake   2480 ml  Output    251 ml  Net   2229 ml   Blood pressure 117/56, pulse 93, temperature 98.6 F (37 C), temperature source Oral, resp. rate 23, height 5' (1.524 m), weight 56.1 kg (123 lb 10.9 oz), SpO2 97.00%.  Physical Exam: General: No acute respiratory distress Lungs: Clear to auscultation bilaterally without wheezes or crackles Cardiovascular: Regular rate and rhythm without murmur gallop or rub  Abdomen: Nontender, nondistended, soft, bowel sounds positive, no rebound, no ascites, no appreciable mass Extremities: No significant cyanosis, clubbing, or edema bilateral lower extremities  Lab Results:  [facility] 12/13/11 0332 12/11/11 2210  NA 134* 133*  K 3.9 4.5  CL 102 99  CO2 21 21  GLUCOSE 253* 176*  BUN 12 18  CREATININE 0.84 0.93  CALCIUM 8.7 9.4  MG -- --  PHOS -- --    Basename 12/11/11 2210  AST 32  ALT 16  ALKPHOS 58  BILITOT  0.3  PROT 7.1  ALBUMIN 2.6*    Basename 12/13/11 0332 12/12/11 1555 12/12/11 0600 12/11/11 2210  WBC 8.8 10.9* 10.7* --  NEUTROABS -- -- -- 7.6  HGB 8.3* 9.1* 8.8* --  HCT 25.1* 27.3* 26.7* --  MCV 85.7 85.3 86.1 --  PLT 379 394 375 --   Micro Results: Recent Results (from the past 240 hour(s))  MRSA PCR SCREENING     Status: Normal   Collection Time   12/12/11  2:45 AM      Component Value Range Status Comment   MRSA by PCR NEGATIVE  NEGATIVE  Final     Studies/Results: All recent x-ray/radiology reports have been reviewed in detail.   Medications: I have reviewed the patient's complete medication list.  Assessment/Plan:  Supratherapeutic INR: INR has now been corrected w/ Vit K and FFP - INR is now sub therapeutic - if Hgb holds steady, or begins to climb, will resume coumadin - if drops further, will need to cont to hold  Anemia: Hgb has been erratic, but has not yet dropped below 8.0 - there continues to be no evidence of acute blood loss - will cont to hold coumadin and recheck in the AM  Afib: Rate is controlled - unable to anticoag for now as discussed above   Hyponatremia: Na level is fluctuating but remains within a safe range - will recheck in AM  Recent pancreatitis: No c/o abdom pain, or  persistent N/V  Diabetes: CBG has been erratic -  Will adjust tx and follow   Dispo Stable for transfer to tele bed - monitor CBG - begin to ambulate - consider resuming coumadin if Hgb stable as discussed above - probable d/c in next 48hrs if Hgb remains stable  Lonia Blood, MD [facility] Office  [phone] Pager [phone]  On-Call/Text Page:      [email or web address]      password [facility]

## 2011-12-13 NOTE — Progress Notes (Signed)
ANTICOAGULATION CONSULT NOTE - Follow Up Consult  Pharmacy Consult for Coumadin Indication: atrial fibrillation  Assessment: 78 y.o. female with h/o atrial fibrillation admitted on 12/11/2011 with INR > 10. Pharmacy consulted to manage Coumadin.  Patient has received 10mg  IV phytonadione and 1 unit FFP. INR down to 1.86 today.  Slight drop in Hgb but no bleeding noted upon discussion with nurse. Will restart coumadin at a low dose.   Goal of Therapy:  INR 2-3   Plan:  1, Coumadin 2mg  po x 1 today at 1800 2. F/u INR in AM  Thank you,  Brett Fairy, PharmD Pager: [phone]  12/13/2011 1:45 PM   Allergies  Allergen Reactions  . Lactose Intolerance (Gi) Nausea And Vomiting    severe stomach pain  . Penicillins Hives and Itching  . Sulfa Antibiotics Hives and Itching    Patient Measurements: Height: 5' (152.4 cm) Weight: 123 lb 10.9 oz (56.1 kg) IBW/kg (Calculated) : 45.5   Vital Signs: Temp: 98.6 F (37 C) (03/10 1200) Temp src: Oral (03/10 1200) BP: 115/56 mmHg (03/10 1300) Pulse Rate: 88  (03/10 1300)  Labs:  Basename 12/13/11 0332 12/12/11 1555 12/12/11 0600 12/11/11 2210  HGB 8.3* 9.1* -- --  HCT 25.1* 27.3* 26.7* --  PLT 379 394 375 --  APTT -- -- -- --  LABPROT 21.8* -- 24.7* >90.0*  INR 1.86* -- 2.19* >10.00*  HEPARINUNFRC -- -- -- --  CREATININE 0.84 -- -- 0.93  CKTOTAL -- -- -- --  CKMB -- -- -- --  TROPONINI -- -- -- --   Estimated Creatinine Clearance: 42.6 ml/min (by C-G formula based on Cr of 0.84).  Medications:  Scheduled:    . ALPRAZolam  0.5 mg Oral Once  . ALPRAZolam  0.5 mg Oral Once  . atorvastatin  20 mg Oral q1800  . digoxin  0.125 mg Oral Daily  . diltiazem  120 mg Oral Daily  . fenofibrate  160 mg Oral Daily  . insulin aspart  0-15 Units Subcutaneous TID WC  . insulin aspart  0-5 Units Subcutaneous QHS  . insulin glargine  10 Units Subcutaneous QHS  . latanoprost  1 drop Both Eyes QHS  . lisinopril  5 mg Oral Daily  .  methylphenidate  5 mg Oral BID  . pantoprazole (PROTONIX) IV  40 mg Intravenous Q12H  . Warfarin - Pharmacist Dosing Inpatient   Does not apply q1800   Infusions:    . dextrose 5 % and 0.9% NaCl 75 mL/hr at 12/13/11 1241  . DISCONTD: sodium chloride 75 mL/hr at 12/12/11 1534   PRN: acetaminophen, acetaminophen, hyoscyamine, ondansetron (ZOFRAN) IV, ondansetron   Bufford Buttner, Amarise Lillo N 12/13/2011,1:43 PM

## 2011-12-14 LAB — BASIC METABOLIC PANEL
CO2: 22 mEq/L (ref 19–32)
Chloride: 109 mEq/L (ref 96–112)
Creatinine, Ser: 0.81 mg/dL (ref 0.50–1.10)
Glucose, Bld: 62 mg/dL — ABNORMAL LOW (ref 70–99)

## 2011-12-14 LAB — GLUCOSE, CAPILLARY
Glucose-Capillary: 159 mg/dL — ABNORMAL HIGH (ref 70–99)
Glucose-Capillary: 146 mg/dL — ABNORMAL HIGH (ref 70–99)

## 2011-12-14 LAB — DIGOXIN LEVEL: Digoxin Level: 1.1 ng/mL (ref 0.8–2.0)

## 2011-12-14 LAB — CBC
Hemoglobin: 8.9 g/dL — ABNORMAL LOW (ref 12.0–15.0)
MCV: 86.7 fL (ref 78.0–100.0)
Platelets: 422 10*3/uL — ABNORMAL HIGH (ref 150–400)
RBC: 3.16 MIL/uL — ABNORMAL LOW (ref 3.87–5.11)
WBC: 8.9 10*3/uL (ref 4.0–10.5)

## 2011-12-14 MED ORDER — CHLORHEXIDINE GLUCONATE 0.12 % MT SOLN
15.0000 mL | Freq: Two times a day (BID) | OROMUCOSAL | Status: DC
  Administered 2011-12-14 – 2011-12-20 (×10): 15 mL via OROMUCOSAL
  Filled 2011-12-14 (×14): qty 15

## 2011-12-14 MED ORDER — BIOTENE DRY MOUTH MT LIQD
15.0000 mL | Freq: Two times a day (BID) | OROMUCOSAL | Status: DC
  Administered 2011-12-14 – 2011-12-20 (×12): 15 mL via OROMUCOSAL

## 2011-12-14 MED ORDER — PANTOPRAZOLE SODIUM 40 MG PO TBEC
40.0000 mg | DELAYED_RELEASE_TABLET | Freq: Every day | ORAL | Status: DC
  Administered 2011-12-14 – 2011-12-20 (×7): 40 mg via ORAL
  Filled 2011-12-14 (×6): qty 1

## 2011-12-14 MED ORDER — DIGOXIN 0.0625 MG HALF TABLET
0.0625 mg | ORAL_TABLET | Freq: Every day | ORAL | Status: DC
  Administered 2011-12-15 – 2011-12-20 (×6): 0.0625 mg via ORAL
  Filled 2011-12-14 (×7): qty 1

## 2011-12-14 NOTE — Progress Notes (Signed)
OT Cancellation Note  Treatment cancelled today due to INR greater than 10. Will re check on this pt next day   [name], [name] 12/14/2011, 12:39 PM

## 2011-12-14 NOTE — Progress Notes (Signed)
Pt arrived to the unit with admitting of elevated INR. Pt is alert and oriented x4. Ambulatory with stand by assist. Continent of bowel and bladder. Has no skin break down noted. She has some redness on her buttocks that is blanchable. Pt is from home. Oriented to staff and unit. Has family at bedside. Denies any discomfort. Will cont to monitor.

## 2011-12-14 NOTE — Progress Notes (Signed)
Inpatient Diabetes Program Recommendations  AACE/ADA: New Consensus Statement on Inpatient Glycemic Control  Target Ranges:  Prepandial:   less than 140 mg/dL      Peak postprandial:   less than 180 mg/dL (1-2 hours)      Critically ill patients:  140 - 180 mg/dL  Pager:  [phone] Hours:  8 am-10pm   Reason for Visit: Hypoglycemia: 62 mg/dL  Inpatient Diabetes Program Recommendations Insulin - Basal: Decrease Lantus to 10 units qhs

## 2011-12-14 NOTE — Progress Notes (Addendum)
[facility] [place] TEAM 1 - Stepdown/ICU TEAM  Subjective: [name] with h/o DM/HTN, prior pancreatitis several years ago, and recent admission for repeat pancreatitis now presents with INR > 10 and minimal drop in Hgb. Pt was [facility] for pancreatitis, had ERCP with Dr. Elnoria Howard. During that hospitalization she also had worsened AFib, fevers, and was treated wtih primaxin which was switched to ciprofloxacin. She was loaded with Digoxin and weaned off IV cardizem, onto PO Cardizem. She states that overall her abdominal pain and pancreatitis symptoms have been improving, but her home health nurse checked her INR, and per discussion with ED staff, this was why she was sent in as it was high.  Pt denies f/c, sob, n/v, abdom pain.  She is having some pain in the L lateral chest wall in what sounds like a dermatomal distribution, but there is no rash as of yet.  She denies melena or hematochezia.  She has not yet been out of bed/ambulating, but she feels very tired.  Objective: Weight change:   Intake/Output Summary (Last 24 hours) at 12/14/11 1440 Last data filed at 12/14/11 0945  Gross per 24 hour  Intake  990.5 ml  Output    651 ml  Net  339.5 ml   Blood pressure 130/58, pulse 88, temperature 98.7 F (37.1 C), temperature source Oral, resp. rate 11, height 5' (1.524 m), weight 56.1 kg (123 lb 10.9 oz), SpO2 98.00%.  Physical Exam: General: No acute respiratory distress Lungs: Clear to auscultation bilaterally without wheezes or crackles Cardiovascular: Regular rate without murmur gallop or rub  Abdomen: Nontender, nondistended, soft, bowel sounds positive, no rebound, no ascites, no appreciable mass Extremities: No significant cyanosis, clubbing, or edema bilateral lower extremities Chest wall:  No evidence of L lateral chest wall rash or erythema  Lab Results:  Basename 12/14/11 0500 12/13/11 0332 12/11/11 2210  NA 140 134* 133*  K 3.4* 3.9 4.5  CL 109 102  99  CO2 22 21 21   GLUCOSE 62* 253* 176*  BUN 7 12 18   CREATININE 0.81 0.84 0.93  CALCIUM 9.1 8.7 9.4  MG -- -- --  PHOS -- -- --    Basename 12/11/11 2210  AST 32  ALT 16  ALKPHOS 58  BILITOT 0.3  PROT 7.1  ALBUMIN 2.6*    Basename 12/14/11 0500 12/13/11 0332 12/12/11 1555 12/11/11 2210  WBC 8.9 8.8 10.9* --  NEUTROABS -- -- -- 7.6  HGB 8.9* 8.3* 9.1* --  HCT 27.4* 25.1* 27.3* --  MCV 86.7 85.7 85.3 --  PLT 422* 379 394 --   Micro Results: Recent Results (from the past 240 hour(s))  MRSA PCR SCREENING     Status: Normal   Collection Time   12/12/11  2:45 AM      Component Value Range Status Comment   MRSA by PCR NEGATIVE  NEGATIVE  Final     Studies/Results: All recent x-ray/radiology reports have been reviewed in detail.   Medications: I have reviewed the patient's complete medication list.  Assessment/Plan:  Supratherapeutic INR: INR was corrected w/ Vit K and FFP (>10 at admit) - INR, though transiently sub therapeutic, is now at the high end of therapeutic range, despite not being dosed w/ coumadin since admit - there has been no evidence of bleeding, but I will not yet restart coumadin until it is clear that her INR is stabilizing  Anemia: Hgb has been erratic, but has not yet dropped below 8.0 - there  continues to be no evidence of acute blood loss - with today's improved Hgb, I feel comfortable resuming coumadin as soon as issues above are addressed  Afib: Rate is controlled - INR therapeutic - dig above therapeutic goal for female pts - will decrease dose and follow  Hyponatremia: Appears to have resolved - cont to follow  Recent pancreatitis: No c/o abdom pain, or persistent N/V - recheck lipase given chest wall/epigastric sx  Diabetes: CBG has been erratic -  No change today - cont to follow   Chest wall pain Sx are worrisome for shingles - no rash - will not start tx due to common side effects in geriatric population unless rash confirms - check  lipase as noted above   Deconditioning Pt is severely deconditioned - PT/OT evals are ordered, but OT did not see today "due to INR of 11" - this is erroneous - I have reconsulted PT/OT with instructions that she is medically cleared for eval/tx - she may well require a rehab unit of [facility] stay prior to d/c home   Lonia Blood, MD [facility] Office  [phone] Pager [phone]  On-Call/Text Page:      [email or web address]      password [facility]

## 2011-12-15 ENCOUNTER — Inpatient Hospital Stay (HOSPITAL_COMMUNITY): Payer: Medicare Other

## 2011-12-15 LAB — CBC
HCT: 28 % — ABNORMAL LOW (ref 36.0–46.0)
Hemoglobin: 9.2 g/dL — ABNORMAL LOW (ref 12.0–15.0)
MCH: 27.9 pg (ref 26.0–34.0)
MCHC: 32.9 g/dL (ref 30.0–36.0)
MCV: 84.8 fL (ref 78.0–100.0)
Platelets: 429 K/uL — ABNORMAL HIGH (ref 150–400)
RBC: 3.3 MIL/uL — ABNORMAL LOW (ref 3.87–5.11)
RDW: 14 % (ref 11.5–15.5)
WBC: 11.6 K/uL — ABNORMAL HIGH (ref 4.0–10.5)

## 2011-12-15 LAB — BASIC METABOLIC PANEL WITH GFR
BUN: 8 mg/dL (ref 6–23)
CO2: 22 meq/L (ref 19–32)
Calcium: 9 mg/dL (ref 8.4–10.5)
Chloride: 101 meq/L (ref 96–112)
Creatinine, Ser: 0.92 mg/dL (ref 0.50–1.10)
GFR calc Af Amer: 67 mL/min — ABNORMAL LOW
GFR calc non Af Amer: 58 mL/min — ABNORMAL LOW
Glucose, Bld: 183 mg/dL — ABNORMAL HIGH (ref 70–99)
Potassium: 3.5 meq/L (ref 3.5–5.1)
Sodium: 135 meq/L (ref 135–145)

## 2011-12-15 LAB — GLUCOSE, CAPILLARY
Glucose-Capillary: 147 mg/dL — ABNORMAL HIGH (ref 70–99)
Glucose-Capillary: 159 mg/dL — ABNORMAL HIGH (ref 70–99)
Glucose-Capillary: 105 mg/dL — ABNORMAL HIGH (ref 70–99)

## 2011-12-15 LAB — LIPASE, BLOOD: Lipase: 53 U/L (ref 11–59)

## 2011-12-15 MED ORDER — VANCOMYCIN HCL IN DEXTROSE 1-5 GM/200ML-% IV SOLN
1000.0000 mg | Freq: Once | INTRAVENOUS | Status: AC
  Administered 2011-12-15: 1000 mg via INTRAVENOUS
  Filled 2011-12-15: qty 200

## 2011-12-15 MED ORDER — VANCOMYCIN HCL 1000 MG IV SOLR
750.0000 mg | INTRAVENOUS | Status: DC
  Filled 2011-12-15: qty 750

## 2011-12-15 MED ORDER — HYOSCYAMINE SULFATE 0.125 MG SL SUBL
0.1250 mg | SUBLINGUAL_TABLET | SUBLINGUAL | Status: DC | PRN
  Administered 2011-12-15: 0.125 mg via SUBLINGUAL
  Filled 2011-12-15 (×3): qty 1

## 2011-12-15 MED ORDER — IOHEXOL 300 MG/ML  SOLN
100.0000 mL | Freq: Once | INTRAMUSCULAR | Status: AC | PRN
  Administered 2011-12-15: 100 mL via INTRAVENOUS

## 2011-12-15 MED ORDER — MORPHINE SULFATE 2 MG/ML IJ SOLN: 1.0000 mg | INTRAMUSCULAR | Status: DC | PRN

## 2011-12-15 MED ORDER — WARFARIN - PHARMACIST DOSING INPATIENT: Freq: Every day | Status: DC

## 2011-12-15 MED ORDER — PIPERACILLIN-TAZOBACTAM 3.375 G IVPB
3.3750 g | Freq: Three times a day (TID) | INTRAVENOUS | Status: DC
  Administered 2011-12-16 (×2): 3.375 g via INTRAVENOUS
  Filled 2011-12-15 (×4): qty 50

## 2011-12-15 MED ORDER — HYOSCYAMINE SULFATE ER 0.375 MG PO TB12
0.3750 mg | ORAL_TABLET | Freq: Two times a day (BID) | ORAL | Status: DC
  Administered 2011-12-15: 0.375 mg via ORAL
  Filled 2011-12-15: qty 1

## 2011-12-15 MED ORDER — IOHEXOL 300 MG/ML  SOLN
20.0000 mL | INTRAMUSCULAR | Status: AC
  Administered 2011-12-15 (×2): 20 mL via ORAL

## 2011-12-15 NOTE — Progress Notes (Addendum)
Patient ID: [name] [name], female   DOB: 1930/03/06, 78 y.o.   MRN: [id number]  Assessment/Plan:   78yoF with h/o DM/HTN, prior pancreatitis several years ago, and recent admission for recurrent pancreatitis presented with INR > 10   Supratherapeutic INR:  - INR has now been corrected w/ Vit K and FFP - INR is still supra therapeutic, 3.90 - coumadin on hold again  Fever - unclear what the source is - start empiric broad spectrum antibiotics - follow up pan cultures - follow up CT abdomen/pelvis  Anemia:  - Hgb stable at 9.2 - no signs of active bleed  Afib:  - Rate is controlled - unable to anticoag for now as discussed above   Hyponatremia:  - Na level is fluctuating but remains within a safe range - will recheck in AM   Abdominal pain - obtain CT abdomen/pelvis - start morphine IV PRN - this perhaps is secondary to recurrent pancreatitis, we will follow up CT abd results  Diabetes:  - sliding scale monitoring - CBG monitoring  Dispo  - follow up PT recommendations  Subjective: No events overnight. Patient complains of abdominal pain.  Objective:  Vital signs in last 24 hours:  Filed Vitals:   12/15/11 0535 12/15/11 0846 12/15/11 1250 12/15/11 1652  BP: 120/67 121/59 111/60 112/54  Pulse: 86 101 109 89  Temp: 98.5 F (36.9 C) 98.8 F (37.1 C) 98.9 F (37.2 C) 97.7 F (36.5 C)  TempSrc: Oral Oral Oral Oral  Resp: 18 17 18 17   Height:      Weight:      SpO2: 95% 97% 96% 99%    Intake/Output from previous day:   Intake/Output Summary (Last 24 hours) at 12/15/11 1813 Last data filed at 12/15/11 1804  Gross per 24 hour  Intake    240 ml  Output    350 ml  Net   -110 ml    Physical Exam: General: Alert, awake, oriented x3, in no acute distress. HEENT: No bruits, no goiter. Moist mucous membranes, no scleral icterus, no conjunctival pallor. Heart: Regular rate and rhythm, S1/S2 +, no murmurs, rubs, gallops. Lungs: Clear to auscultation  bilaterally. No wheezing, no rhonchi, no rales.  Abdomen: Soft, nontender, nondistended, positive bowel sounds. Extremities: No clubbing or cyanosis, no pitting edema,  positive pedal pulses. Neuro: Grossly nonfocal.  Lab Results:   Lab 12/15/11 0535 12/14/11 0500 12/13/11 0332 12/12/11 1555 12/12/11 0600  WBC 11.6* 8.9 8.8 10.9* 10.7*  HGB 9.2* 8.9* 8.3* 9.1* 8.8*  HCT 28.0* 27.4* 25.1* 27.3* 26.7*  PLT 429* 422* 379 394 375  MCV 84.8 86.7 85.7 85.3 86.1    Lab 12/15/11 0535 12/14/11 0500 12/13/11 0332 12/11/11 2210  NA 135 140 134* 133*  K 3.5 3.4* 3.9 4.5  CL 101 109 102 99  CO2 22 22 21 21   GLUCOSE 183* 62* 253* 176*  BUN 8 7 12 18   CREATININE 0.92 0.81 0.84 0.93  CALCIUM 9.0 9.1 8.7 9.4    Lab 12/15/11 0535 12/14/11 0500 12/13/11 0332 12/12/11 0600 12/11/11 2210  INR 3.90* 2.96* 1.86* 2.19* >10.00*    Medications: Scheduled Meds:    . atorvastatin  20 mg Oral q1800  . chlorhexidine  15 mL Mouth Rinse BID  . digoxin  0.0625 mg Oral Daily  . diltiazem  120 mg Oral Daily  . fenofibrate  160 mg Oral Daily  . insulin aspart  0-15 Units Subcutaneous TID WC  . insulin aspart  0-5 Units Subcutaneous  QHS  . insulin glargine  14 Units Subcutaneous QHS  . latanoprost  1 drop Both Eyes QHS  . lisinopril  5 mg Oral Daily  . methylphenidate  5 mg Oral BID  . pantoprazole  40 mg Oral Q1200     LOS: 4 days   [name], [name] 12/15/2011, 6:13 PM  [facility] Pager: [phone]

## 2011-12-15 NOTE — Progress Notes (Signed)
Agree with above. Pt stated that she will participate in PT tomorrow with pain in control.   Thanks 12/15/2011 Fredrich Birks PTA [phone] pager [phone] office

## 2011-12-15 NOTE — Progress Notes (Signed)
ANTIBIOTIC CONSULT NOTE - INITIAL  Pharmacy Consult for vancomycin/zosyn Indication: fever  Allergies  Allergen Reactions  . Lactose Intolerance (Gi) Nausea And Vomiting    severe stomach pain  . Penicillins Hives and Itching  . Sulfa Antibiotics Hives and Itching   Vital Signs: Temp: 97.7 F (36.5 C) (03/12 1652) Temp src: Oral (03/12 1652) BP: 112/54 mmHg (03/12 1652) Pulse Rate: 89  (03/12 1652) Intake/Output from previous day: 03/11 0701 - 03/12 0700 In: 395.5 [P.O.:358; I.V.:27.5; IV Piggyback:10] Out: 251 [Urine:250; Stool:1] Intake/Output from this shift:    Labs:  Basename 12/15/11 0535 12/14/11 0500 12/13/11 0332  WBC 11.6* 8.9 8.8  HGB 9.2* 8.9* 8.3*  PLT 429* 422* 379  LABCREA -- -- --  CREATININE 0.92 0.81 0.84   Estimated Creatinine Clearance: 39.5 ml/min (by C-G formula based on Cr of 0.92). No results found for this basename: VANCOTROUGH:2,VANCOPEAK:2,VANCORANDOM:2,GENTTROUGH:2,GENTPEAK:2,GENTRANDOM:2,TOBRATROUGH:2,TOBRAPEAK:2,TOBRARND:2,AMIKACINPEAK:2,AMIKACINTROU:2,AMIKACIN:2, in the last 72 hours   Microbiology: Recent Results (from the past 720 hour(s))  MRSA PCR SCREENING     Status: Normal   Collection Time   11/19/11 10:40 AM      Component Value Range Status Comment   MRSA by PCR NEGATIVE  NEGATIVE  Final   CULTURE, BLOOD (ROUTINE X 2)     Status: Normal   Collection Time   11/21/11  1:30 PM      Component Value Range Status Comment   Specimen Description BLOOD RIGHT ARM   Final    Special Requests BOTTLES DRAWN AEROBIC ONLY 5CC   Final    Culture  Setup Time [id number]   Final    Culture NO GROWTH 5 DAYS   Final    Report Status 11/27/2011 FINAL   Final   CULTURE, BLOOD (ROUTINE X 2)     Status: Normal   Collection Time   11/21/11  4:28 PM      Component Value Range Status Comment   Specimen Description BLOOD RIGHT HAND   Final    Special Requests BOTTLES DRAWN AEROBIC ONLY [facility]   Final    Culture  Setup Time [id number]   Final    Culture NO GROWTH 5 DAYS   Final    Report Status 11/27/2011 FINAL   Final   MRSA PCR SCREENING     Status: Normal   Collection Time   12/12/11  2:45 AM      Component Value Range Status Comment   MRSA by PCR NEGATIVE  NEGATIVE  Final     Medical History: Past Medical History  Diagnosis Date  . Pancreatitis     ~2008 and again in 11/2011 s/p ERCP  . Hypertension   . Diabetes mellitus   . Glaucoma   . High cholesterol   . GERD (gastroesophageal reflux disease)   . H/O hiatal hernia   . Atrial fibrillation     Coumadin   Assessment: 78 year old female known to pharmacy service for warfarin dosing, now with fever spike of 102.2. WBC only slightly above normal at 11.6, will start broad empiric antibiotics with vancomycin and zosyn. Will adjust vancomycin given CrCl~40.  Goal of Therapy:  Vancomycin trough level 15-20 mcg/ml  Plan:  Vancomycin 1g iv now then 750mg  q 24 hours Zosyn 3.375 grams iv q8 hours Follow fever curve and culture data.  [name] [name] 12/15/2011,9:35 PM

## 2011-12-15 NOTE — Progress Notes (Signed)
ANTICOAGULATION CONSULT NOTE - Follow Up Consult  Pharmacy Consult for Warfarin Indication: Hx atrial fibrillation   Allergies  Allergen Reactions  . Lactose Intolerance (Gi) Nausea And Vomiting    severe stomach pain  . Penicillins Hives and Itching  . Sulfa Antibiotics Hives and Itching    Patient Measurements: Height: 5' (152.4 cm) Weight: 127 lb 13.9 oz (58 kg) IBW/kg (Calculated) : 45.5   Vital Signs: Temp: 98.9 F (37.2 C) (03/12 1250) Temp src: Oral (03/12 1250) BP: 111/60 mmHg (03/12 1250) Pulse Rate: 109  (03/12 1250)  Labs:  Basename 12/15/11 0535 12/14/11 0500 12/13/11 0332  HGB 9.2* 8.9* --  HCT 28.0* 27.4* 25.1*  PLT 429* 422* 379  APTT -- -- --  LABPROT 38.8* 31.3* 21.8*  INR 3.90* 2.96* 1.86*  HEPARINUNFRC -- -- --  CREATININE 0.92 0.81 0.84  CKTOTAL -- -- --  CKMB -- -- --  TROPONINI -- -- --   Estimated Creatinine Clearance: 39.5 ml/min (by C-G formula based on Cr of 0.92).   Assessment: 78 y.o. F admitted on warfarin PTA for Afib currently being held. The patient was admitted on 3/8 with INR >10 which was reversed with Vit K 10 mg IV and FFP. INR trended down to 1.86 on 3/10 however now is trending back up. INR this morning is SUPRAtherapeutic (INR 3.9, goal of 2-3). The patient has not received any warfarin doses this admission. Hgb/Hct/Plt stable. No s/sx of bleeding noted.   Goal of Therapy:  INR 2-3   Plan:  1. Hold warfarin dose today 2. Will continue to monitor for any signs/symptoms of bleeding and will follow up with PT/INR in the a.m.   Georgina Pillion, PharmD, BCPS Clinical Pharmacist Pager: [phone] 12/15/2011 1:31 PM

## 2011-12-15 NOTE — Progress Notes (Signed)
PT/OT Cancellation Note  Treatment cancelled today due to patient's refusal to participate due to 10/10 pain. Pt. Reports getting up to 3-in-1 with nursing staff and reports she will participate 3/13. Educated pt. On importance of participating with therapy and pt. Verbalized understanding.  Chamberlain Steinborn, OTR/L Pager [phone] 12/15/2011, 10:49 AM

## 2011-12-15 NOTE — Progress Notes (Signed)
Utilization Review Completed.Rafia Shedden T3/09/2012   

## 2011-12-16 LAB — BASIC METABOLIC PANEL
Chloride: 103 mEq/L (ref 96–112)
Creatinine, Ser: 0.81 mg/dL (ref 0.50–1.10)
GFR calc Af Amer: 78 mL/min — ABNORMAL LOW (ref >90)
Potassium: 3.1 mEq/L — ABNORMAL LOW (ref 3.5–5.1)
Sodium: 139 mEq/L (ref 135–145)

## 2011-12-16 LAB — URINALYSIS, ROUTINE W REFLEX MICROSCOPIC
Nitrite: NEGATIVE
Protein, ur: NEGATIVE mg/dL
Urobilinogen, UA: 1 mg/dL (ref 0.0–1.0)

## 2011-12-16 LAB — GLUCOSE, CAPILLARY
Glucose-Capillary: 141 mg/dL — ABNORMAL HIGH (ref 70–99)
Glucose-Capillary: 197 mg/dL — ABNORMAL HIGH (ref 70–99)
Glucose-Capillary: 91 mg/dL (ref 70–99)
Glucose-Capillary: 160 mg/dL — ABNORMAL HIGH (ref 70–99)

## 2011-12-16 LAB — PROTIME-INR
INR: 4.47 — ABNORMAL HIGH (ref 0.00–1.49)
Prothrombin Time: 43.2 seconds — ABNORMAL HIGH (ref 11.6–15.2)

## 2011-12-16 LAB — CBC
MCV: 85.1 fL (ref 78.0–100.0)
Platelets: 471 10*3/uL — ABNORMAL HIGH (ref 150–400)
RBC: 3.42 MIL/uL — ABNORMAL LOW (ref 3.87–5.11)
RDW: 14 % (ref 11.5–15.5)
WBC: 11.4 10*3/uL — ABNORMAL HIGH (ref 4.0–10.5)

## 2011-12-16 LAB — URINE MICROSCOPIC-ADD ON

## 2011-12-16 MED ORDER — POTASSIUM CHLORIDE CRYS ER 20 MEQ PO TBCR
40.0000 meq | EXTENDED_RELEASE_TABLET | Freq: Once | ORAL | Status: AC
  Administered 2011-12-16: 40 meq via ORAL
  Filled 2011-12-16: qty 2

## 2011-12-16 MED FILL — Insulin Glargine Inj 100 Unit/ML: SUBCUTANEOUS | Qty: 0.14 | Status: AC

## 2011-12-16 NOTE — Evaluation (Signed)
Occupational Therapy Evaluation Patient Details Name: [name] [name] MRN: [id number] DOB: 1930/03/17 Today's Date: 12/16/2011  Problem List:  Patient Active Problem List  Diagnoses  . Pancreatitis  . UTI (lower urinary tract infection)  . DM (diabetes mellitus)  . HTN (hypertension)  . Hypercholesteremia  . Glaucoma (increased eye pressure)  . Hyponatremia  . Fever  . Leukocytosis  . HCAP (healthcare-associated pneumonia)  . Hypokalemia  . Atrial fibrillation  . Coagulopathy  . Anemia  . GI bleed    Past Medical History:  Past Medical History  Diagnosis Date  . Pancreatitis     ~2008 and again in 11/2011 s/p ERCP  . Hypertension   . Diabetes mellitus   . Glaucoma   . High cholesterol   . GERD (gastroesophageal reflux disease)   . H/O hiatal hernia   . Atrial fibrillation     Coumadin   Past Surgical History:  Past Surgical History  Procedure Date  . Appendectomy   . Abdominal hysterectomy   . Cholecystectomy   . Ercp 11/13/2011    Procedure: ENDOSCOPIC RETROGRADE CHOLANGIOPANCREATOGRAPHY (ERCP);  Surgeon: Theda Belfast, MD;  Location: [facility] ENDOSCOPY;  Service: Endoscopy;  Laterality: N/A;    OT Assessment/Plan/Recommendation OT Assessment Clinical Impression Statement: Pt. presents with pancreatitis and with INR > 10 thus resulting in below problem list. Pt. will benefit from skilled OT to increase functional independence with ADLs and get pt. to supervision level at D/C home.  OT Recommendation/Assessment: Patient will need skilled OT in the acute care venue OT Problem List: Decreased strength;Decreased activity tolerance;Impaired balance (sitting and/or standing);Decreased safety awareness;Decreased knowledge of use of DME or AE;Decreased knowledge of precautions;Pain Barriers to Discharge: None OT Therapy Diagnosis : Generalized weakness;Acute pain OT Plan OT Frequency: Min 2X/week OT Treatment/Interventions: Self-care/ADL training;DME and/or AE  instruction;Therapeutic activities;Patient/family education;Balance training OT Recommendation Follow Up Recommendations: Home health OT;Supervision - Intermittent Equipment Recommended: None recommended by OT Individuals Consulted Consulted and Agree with Results and Recommendations: Patient OT Goals Acute Rehab OT Goals OT Goal Formulation: With patient Time For Goal Achievement: 7 days ADL Goals Pt Will Perform Lower Body Bathing: with set-up;with supervision;Sit to stand from bed;with adaptive equipment ADL Goal: Lower Body Bathing - Progress: Goal set today Pt Will Perform Lower Body Dressing: with set-up;with supervision;with adaptive equipment;Sit to stand from bed ADL Goal: Lower Body Dressing - Progress: Goal set today Pt Will Transfer to Toilet: with supervision;Ambulation;with DME;3-in-1 ADL Goal: Toilet Transfer - Progress: Goal set today Additional ADL Goal #1: Pt. will use 2 energy conservation techniques with an ADL task to increase activity tolerance ADL Goal: Additional Goal #1 - Progress: Goal set today  OT Evaluation Precautions/Restrictions  Precautions Precautions: Fall Required Braces or Orthoses: No Restrictions Weight Bearing Restrictions: No Prior Functioning Home Living Lives With: Spouse Receives Help From: Family Type of Home: House Home Layout: One level Home Access: Ramped entrance Bathroom Shower/Tub: Engineer, manufacturing systems: Handicapped height Bathroom Accessibility: Yes How Accessible: Accessible via walker Home Adaptive Equipment: Walker - four wheeled;Straight cane;Raised toilet seat with rails;Shower chair without back;Wheelchair - manual Additional Comments: aide 3x/wk; HHPT and RN Prior Function Level of Independence: Requires assistive device for independence;Needs assistance with ADLs Bath: Maximal Toileting: Supervision/set-up Grooming: Supervision/set-up ADL ADL Eating/Feeding: Performed;Set up Where Assessed -  Eating/Feeding: Chair Grooming: Wash/dry hands;Wash/dry face;Minimal assistance;Set up;Performed;Teeth care Grooming Details (indicate cue type and reason): Min assist for balance while standing at sink and min verbal cues for upright position due to pt. wil bil  UE support on countertop throughout. Pt. completed tasks ~75mins in standing Where Assessed - Grooming: Standing at sink Upper Body Bathing: Simulated;Chest;Right arm;Left arm;Abdomen;Minimal assistance Where Assessed - Upper Body Bathing: Sitting, bed Lower Body Bathing: Simulated;Maximal assistance Where Assessed - Lower Body Bathing: Sit to stand from bed Upper Body Dressing: Performed;Minimal assistance Upper Body Dressing Details (indicate cue type and reason): with donning gown Where Assessed - Upper Body Dressing: Sitting, bed Lower Body Dressing: Simulated;Maximal assistance Where Assessed - Lower Body Dressing: Sit to stand from bed Toilet Transfer: Simulated;Minimal assistance Toilet Transfer Details (indicate cue type and reason): Min verbal cues for hand placement Toilet Transfer Method: Ambulating Toilet Transfer Equipment: Other (comment) Nurse, children's) Toileting - Clothing Manipulation: Simulated;Minimal assistance Where Assessed - Toileting Clothing Manipulation: Sit to stand from 3-in-1 or toilet Toileting - Hygiene: Simulated;Minimal assistance Where Assessed - Toileting Hygiene: Sit to stand from 3-in-1 or toilet Tub/Shower Transfer: Not assessed Tub/Shower Transfer Method: Not assessed Equipment Used: Rolling walker Ambulation Related to ADLs: Pt. min assist ~75' with RW and min verbal cues for upright position ADL Comments: Pt. educated on technique for crossing foot over opposite knee to complete LB ADLs and pt. demonstrated ability to complete with increased time.  Vision/Perception  Vision - History Baseline Vision: Wears glasses all the time Patient Visual Report: No change from baseline Vision -  Assessment Eye Alignment: Within Functional Limits Vision Assessment: Vision not tested    Extremity Assessment RUE Assessment RUE Assessment: Within Functional Limits LUE Assessment LUE Assessment: Within Functional Limits Mobility  Bed Mobility Bed Mobility: Yes Supine to Sit: 5: Supervision;With rails Sitting - Scoot to Edge of Bed: 5: Supervision Sit to Supine: 5: Supervision;With rail;HOB flat Sit to Supine - Details (indicate cue type and reason): Min verbal cues for scooting bil LE and bridging hips to facilitate scooting Transfers Transfers: Yes Sit to Stand: 4: Min assist;With upper extremity assist;From bed Sit to Stand Details (indicate cue type and reason): Min verbal cues for hand placement on RW and to push up from bed    End of Session OT - End of Session Equipment Utilized During Treatment: Gait belt Activity Tolerance: Patient limited by pain Patient left: in bed;with call bell in reach Nurse Communication: Mobility status for transfers General Behavior During Session: [facility] for tasks performed Cognition: [facility] for tasks performed   Taeya Theall, OTR/L Pager [phone] 12/16/2011, 10:20 AM

## 2011-12-16 NOTE — Progress Notes (Signed)
Physical Therapy Treatment Patient Details Name: Savi Lastinger MRN: [id number] DOB: 1930/02/29 Today's Date: 12/16/2011  PT Assessment/Plan  PT - Assessment/Plan Comments on Treatment Session: Pt progressing well today. Stated she had less pain than yesterday. Pt able to ambulate and O2 stay WNL with no signs of distress. Continue to encourage OOB/ambulation with nursing. Pt encouraged to sit OOB after ambulation but requested to lay back in bed.  PT Plan: Discharge plan remains appropriate;Frequency remains appropriate PT Frequency: Min 3X/week Recommendations for Other Services: OT consult Follow Up Recommendations: Home health PT;Supervision/Assistance - 24 hour Equipment Recommended: None recommended by PT PT Goals  Acute Rehab PT Goals PT Goal: Supine/Side to Sit - Progress: Progressing toward goal PT Goal: Sit to Stand - Progress: Progressing toward goal PT Goal: Stand to Sit - Progress: Progressing toward goal PT Goal: Ambulate - Progress: Progressing toward goal  PT Treatment Precautions/Restrictions  Precautions Precautions: Fall Required Braces or Orthoses: No Restrictions Weight Bearing Restrictions: No Mobility (including Balance) Bed Mobility Bed Mobility: Yes Supine to Sit: 5: Supervision;With rails Sitting - Scoot to Edge of Bed: 5: Supervision Sit to Supine: 5: Supervision;With rail;HOB flat Sit to Supine - Details (indicate cue type and reason): Cues for positioning of LEs and for bridging.  Transfers Sit to Stand: 4: Min assist;With upper extremity assist;From bed Sit to Stand Details (indicate cue type and reason): Cues for safe hand placement and not to pull up using RW Ambulation/Gait Ambulation/Gait: Yes Ambulation/Gait Assistance: 4: Min assist Ambulation/Gait Assistance Details (indicate cue type and reason): Cues for safety with RW and for positioning within RW Ambulation Distance (Feet): 90 Feet Assistive device: Rolling walker Gait Pattern:  Decreased stride length Gait velocity: decreased  Dynamic Standing Balance Dynamic Standing - Balance Support: Left upper extremity supported Dynamic Standing - Level of Assistance: 5: Stand by assistance Dynamic Standing - Balance Activities: Reaching for objects;Forward lean/weight shifting;Lateral lean/weight shifting Dynamic Standing - Comments: Pt able to stand with SBA with UE support. Pt Min A without UE support due to lateral/posterior leaning Exercise    End of Session PT - End of Session Equipment Utilized During Treatment: Gait belt Activity Tolerance: Patient limited by fatigue Patient left: in bed;with call bell in reach Nurse Communication: Mobility status for transfers;Mobility status for ambulation General Behavior During Session: [facility] for tasks performed Cognition: [facility] for tasks performed  Dontavion Noxon, Adline Potter 12/16/2011, 11:46 AM 12/16/2011 Fredrich Birks PTA [phone] pager [phone] office

## 2011-12-16 NOTE — Progress Notes (Signed)
Patient ID: [name] [name], female   DOB: 1930/09/26, 78 y.o.   MRN: [id number]  Assessment/Plan:   78yoF with h/o DM/HTN, prior pancreatitis several years ago, and recent admission for recurrent pancreatitis presented with INR > 10   Supratherapeutic INR:  - INR has now been corrected w/ Vit K and FFP - INR is still supra therapeutic, 4.4 - coumadin on hold again, suspect from malnutrition and Vit K deficiency  Fever/Abdominal pain, recent post ERCP pancreatitis, CT w/ peripancreatic complex collections DC empiric antibiotics and watch, blood cultures negative so far Will consult Dr.Hung, lipase normal now Has had abdominal pain with very poor PO intake for >54month  Anemia:  - Hgb stable at 9.2 - no signs of active bleed  Afib:  - Rate is controlled - unable to anticoag for now as discussed above   Hyponatremia: resolved  Diabetes: stable, sliding scale monitoring  Dispo  - follow up PT recommendations, suspect will need SNF  Subjective: No events overnight. Patient complains of abdominal pain, still very poor PO  Objective:  Vital signs in last 24 hours:  Filed Vitals:   12/15/11 2235 12/16/11 0554 12/16/11 1000 12/16/11 1014  BP: 111/43 106/63 101/54   Pulse: 83 89 107 104  Temp: 99 F (37.2 C) 99 F (37.2 C) 97.7 F (36.5 C)   TempSrc: Oral Oral Oral   Resp: 20 20 18    Height:      Weight: 56.4 kg (124 lb 5.4 oz)     SpO2: 98% 94% 96%     Intake/Output from previous day:   Intake/Output Summary (Last 24 hours) at 12/16/11 1236 Last data filed at 12/16/11 0900  Gross per 24 hour  Intake    360 ml  Output   1000 ml  Net   -640 ml    Physical Exam: General: Alert, awake, oriented x3, in no acute distress. HEENT: No bruits, no goiter. Moist mucous membranes, no scleral icterus, no conjunctival pallor. Heart: Regular rate and rhythm, S1/S2 +, no murmurs, rubs, gallops. Lungs: Clear to auscultation bilaterally. No wheezing, no rhonchi, no rales.    Abdomen: Soft, very mild peri-umbilical tenderness, nondistended, positive bowel sounds. Extremities: No clubbing or cyanosis, no pitting edema,  positive pedal pulses. Neuro: Grossly nonfocal.  Lab Results:   Lab 12/15/11 0535 12/14/11 0500 12/13/11 0332 12/12/11 1555 12/12/11 0600  WBC 11.6* 8.9 8.8 10.9* 10.7*  HGB 9.2* 8.9* 8.3* 9.1* 8.8*  HCT 28.0* 27.4* 25.1* 27.3* 26.7*  PLT 429* 422* 379 394 375  MCV 84.8 86.7 85.7 85.3 86.1    Lab 12/15/11 0535 12/14/11 0500 12/13/11 0332 12/11/11 2210  NA 135 140 134* 133*  K 3.5 3.4* 3.9 4.5  CL 101 109 102 99  CO2 22 22 21 21   GLUCOSE 183* 62* 253* 176*  BUN 8 7 12 18   CREATININE 0.92 0.81 0.84 0.93  CALCIUM 9.0 9.1 8.7 9.4    Lab 12/15/11 0535 12/14/11 0500 12/13/11 0332 12/12/11 0600 12/11/11 2210  INR 3.90* 2.96* 1.86* 2.19* >10.00*    Medications: Scheduled Meds:    . atorvastatin  20 mg Oral q1800  . chlorhexidine  15 mL Mouth Rinse BID  . digoxin  0.0625 mg Oral Daily  . diltiazem  120 mg Oral Daily  . fenofibrate  160 mg Oral Daily  . insulin aspart  0-15 Units Subcutaneous TID WC  . insulin aspart  0-5 Units Subcutaneous QHS  . insulin glargine  14 Units Subcutaneous QHS  . latanoprost  1 drop Both Eyes QHS  . lisinopril  5 mg Oral Daily  . methylphenidate  5 mg Oral BID  . pantoprazole  40 mg Oral Q1200     LOS: 5 days   Dymon Summerhill 12/16/2011, 12:36 PM  [facility] Pager: [phone]

## 2011-12-16 NOTE — Progress Notes (Signed)
ANTICOAGULATION CONSULT NOTE - Follow Up Consult  Pharmacy Consult for Warfarin Indication: Hx atrial fibrillation   Patient Measurements: Height: 5' (152.4 cm) Weight: 124 lb 5.4 oz (56.4 kg) IBW/kg (Calculated) : 45.5   Labs:  Basename 12/16/11 0500 12/15/11 0535 12/14/11 0500  HGB 9.7* 9.2* --  HCT 29.1* 28.0* 27.4*  PLT 471* 429* 422*  APTT -- -- --  LABPROT 43.2* 38.8* 31.3*  INR 4.47* 3.90* 2.96*  HEPARINUNFRC -- -- --  CREATININE 0.81 0.92 0.81  CKTOTAL -- -- --  CKMB -- -- --  TROPONINI -- -- --   Estimated Creatinine Clearance: 44.4 ml/min (by C-G formula based on Cr of 0.81).   Assessment: 78 y.o. F admitted on warfarin PTA for Afib currently being held. The patient was admitted on 3/8 with INR >10 which was reversed with Vit K 10 mg IV and FFP. INR trended down to 1.86 on 3/10 however now is trending back up. INR this morning is SUPRAtherapeutic (INR 4.47 << 3.9, goal of 2-3). The patient has not received any warfarin doses this admission. Hgb/Hct/Plt stable. No s/sx of bleeding noted.   Goal of Therapy:  INR 2-3   Plan:  1. Continue to hold warfarin dose today 2. Will continue to monitor for any signs/symptoms of bleeding and will follow up with PT/INR in the a.m.   Georgina Pillion, PharmD, BCPS Clinical Pharmacist Pager: [phone] 12/16/2011 10:40 AM

## 2011-12-17 LAB — URINE CULTURE: Colony Count: NO GROWTH

## 2011-12-17 LAB — BASIC METABOLIC PANEL
CO2: 24 mEq/L (ref 19–32)
GFR calc non Af Amer: 59 mL/min — ABNORMAL LOW (ref >90)
Glucose, Bld: 97 mg/dL (ref 70–99)
Potassium: 3.8 mEq/L (ref 3.5–5.1)
Sodium: 139 mEq/L (ref 135–145)

## 2011-12-17 LAB — GLUCOSE, CAPILLARY: Glucose-Capillary: 91 mg/dL (ref 70–99)

## 2011-12-17 LAB — LIPASE, BLOOD: Lipase: 47 U/L (ref 11–59)

## 2011-12-17 LAB — CBC
Hemoglobin: 9.2 g/dL — ABNORMAL LOW (ref 12.0–15.0)
Platelets: 464 10*3/uL — ABNORMAL HIGH (ref 150–400)
RBC: 3.36 MIL/uL — ABNORMAL LOW (ref 3.87–5.11)

## 2011-12-17 LAB — PROTIME-INR
INR: 4.26 — ABNORMAL HIGH (ref 0.00–1.49)
Prothrombin Time: 41.6 seconds — ABNORMAL HIGH (ref 11.6–15.2)

## 2011-12-17 NOTE — Consult Note (Signed)
Reason for Consult: ABM pain and fever Referring Physician: [facility]  [name] [name] HPI: This is a 78 year old female who was originally admitted for a gallstone pancreatitis and nausea/vomiting.  She underwent an ERCP and a small filling defect was noted, which was felt to be a stone.  Unfortunately, it was not able to be visualized with the sweep of the CBD.  The ERCP was difficult and she subsequently developed a severe post-ERCP pancreatitis.  Over two weeks her course varied from improvement to decline with afib and then subsequent improvement.  At the time of my last evaluation of the patient she was able to be advanced to a solid diet and she tolerated PO without any difficulty.  However, she reports that her pain never completely resolved.  She was able to go home for some time and then she was admitted with an INR of >10.  During this hospitalization she started to complain of some abdominal pain.  The pain is most prominent during the evening and it is in the RUQ/epigastric region.  The patient also spiked a fever and a repeat CT scan revealed small cystic fluid collections versus necrotic LAD.  Broad spectrum antibiotics were started and then discontinued.  Over the past 48 hours she has not had any issues with fever, but she has a mild elevation of her WBC.  Past Medical History  Diagnosis Date  . Pancreatitis     ~2008 and again in 11/2011 s/p ERCP  . Hypertension   . Diabetes mellitus   . Glaucoma   . High cholesterol   . GERD (gastroesophageal reflux disease)   . H/O hiatal hernia   . Atrial fibrillation     Coumadin    Past Surgical History  Procedure Date  . Appendectomy   . Abdominal hysterectomy   . Cholecystectomy   . Ercp 11/13/2011    Procedure: ENDOSCOPIC RETROGRADE CHOLANGIOPANCREATOGRAPHY (ERCP);  Surgeon: Theda Belfast, MD;  Location: [facility] ENDOSCOPY;  Service: Endoscopy;  Laterality: N/A;    Family History  Problem Relation Age of Onset  .  Anesthesia problems Neg Hx   . Hypotension Neg Hx   . Malignant hyperthermia Neg Hx   . Pseudochol deficiency Neg Hx     Social History:  reports that she has never smoked. She has never used smokeless tobacco. She reports that she does not drink alcohol or use illicit drugs.  Allergies:  Allergies  Allergen Reactions  . Lactose Intolerance (Gi) Nausea And Vomiting    severe stomach pain  . Penicillins Hives and Itching  . Sulfa Antibiotics Hives and Itching    Medications:  Scheduled:   . antiseptic oral rinse  15 mL Mouth Rinse q12n4p  . atorvastatin  20 mg Oral q1800  . chlorhexidine  15 mL Mouth Rinse BID  . digoxin  0.0625 mg Oral Daily  . diltiazem  120 mg Oral Daily  . fenofibrate  160 mg Oral Daily  . insulin aspart  0-15 Units Subcutaneous TID WC  . insulin aspart  0-5 Units Subcutaneous QHS  . insulin glargine  14 Units Subcutaneous QHS  . latanoprost  1 drop Both Eyes QHS  . lisinopril  5 mg Oral Daily  . methylphenidate  5 mg Oral BID  . pantoprazole  40 mg Oral Q1200  . Warfarin - Pharmacist Dosing Inpatient   Does not apply q1800   Continuous:   Results for orders placed during the hospital encounter of 12/11/11 (from the past 24  hour(s))  GLUCOSE, CAPILLARY     Status: Normal   Collection Time   12/16/11  4:32 PM      Component Value Range   Glucose-Capillary 91  70 - 99 (mg/dL)   Comment 1 Documented in Chart     Comment 2 Notify RN    GLUCOSE, CAPILLARY     Status: Abnormal   Collection Time   12/16/11  9:17 PM      Component Value Range   Glucose-Capillary 160 (*) 70 - 99 (mg/dL)  PROTIME-INR     Status: Abnormal   Collection Time   12/17/11  5:50 AM      Component Value Range   Prothrombin Time 41.6 (*) 11.6 - 15.2 (seconds)   INR 4.26 (*) 0.00 - 1.49   CBC     Status: Abnormal   Collection Time   12/17/11  5:50 AM      Component Value Range   WBC 11.3 (*) 4.0 - 10.5 (K/uL)   RBC 3.36 (*) 3.87 - 5.11 (MIL/uL)   Hemoglobin 9.2 (*) 12.0 -  15.0 (g/dL)   HCT 16.1 (*) 09.6 - 46.0 (%)   MCV 85.1  78.0 - 100.0 (fL)   MCH 27.4  26.0 - 34.0 (pg)   MCHC 32.2  30.0 - 36.0 (g/dL)   RDW 04.5  40.9 - 81.1 (%)   Platelets 464 (*) 150 - 400 (K/uL)  BASIC METABOLIC PANEL     Status: Abnormal   Collection Time   12/17/11  5:50 AM      Component Value Range   Sodium 139  135 - 145 (mEq/L)   Potassium 3.8  3.5 - 5.1 (mEq/L)   Chloride 106  96 - 112 (mEq/L)   CO2 24  19 - 32 (mEq/L)   Glucose, Bld 97  70 - 99 (mg/dL)   BUN 8  6 - 23 (mg/dL)   Creatinine, Ser 9.14  0.50 - 1.10 (mg/dL)   Calcium 9.4  8.4 - 78.2 (mg/dL)   GFR calc non Af Amer 59 (*) >90 (mL/min)   GFR calc Af Amer 69 (*) >90 (mL/min)  LIPASE, BLOOD     Status: Normal   Collection Time   12/17/11  5:50 AM      Component Value Range   Lipase 47  11 - 59 (U/L)  GLUCOSE, CAPILLARY     Status: Normal   Collection Time   12/17/11  7:28 AM      Component Value Range   Glucose-Capillary 91  70 - 99 (mg/dL)  GLUCOSE, CAPILLARY     Status: Abnormal   Collection Time   12/17/11 12:16 PM      Component Value Range   Glucose-Capillary 136 (*) 70 - 99 (mg/dL)     Ct Abdomen Pelvis W Contrast  12/15/2011  *RADIOLOGY REPORT*  Clinical Data: Mid to right-sided abdominal pain  CT ABDOMEN AND PELVIS WITH CONTRAST  Technique:  Multidetector CT imaging of the abdomen and pelvis was performed following the standard protocol during bolus administration of intravenous contrast.  Contrast: OMNIPAQUE IOHEXOL 300 MG/ML IJ SOLN  Comparison: 11/19/2011  Findings: Mild scarring at the bases.  Coronary artery calcification.  Heart size within normal limits.  No pleural or pericardial effusion.  Mitral annular calcification.  Unremarkable liver, spleen, and adrenal glands.  Since the prior, there has been decreased stranding and fluid surrounding the pancreatic head and extending into the pancreaticoduodenal groove and anterior pararenal space on the right.  However, in the interval, several  small loculated fluid collections with thick peripheral enhancement and/or necrotic adenopathy  have developed in this region in the interval.  Upper pole right renal cyst.  Otherwise symmetric renal enhancement.  Mild perinephric fat stranding is nonspecific.  No hydronephrosis or hydroureter.  Diverticulosis.  No bowel obstruction.  No free intraperitoneal air or fluid.  Thin-walled bladder.  Absent uterus.  No adnexal mass.  Fat containing inguinal hernias.  There is scattered atherosclerotic calcification of the aorta and its branches. No aneurysmal dilatation.  Patent portal vessels.  No acute osseous abnormality.  IMPRESSION: Adjacent to the head of the pancreas/uncinate process, pancreaticoduodenal groove, and anterior pararenal space on the right, the previously noted fluid has decreased in quantity over the past month. However, there are now several peripherally enhancing complex collections of indeterminate sterility versus necrotic lymphadenopathy. Consider GI consultation as this may be amenable to sampling via EUS.  Original Report Authenticated By: Waneta Martins, M.D.    ROS:  As stated above in the HPI otherwise negative.  Blood pressure 110/72, pulse 120, temperature 99 F (37.2 C), temperature source Oral, resp. rate 18, height 5' (1.524 m), weight 56.6 kg (124 lb 12.5 oz), SpO2 98.00%.    PE: Gen: NAD, but very frail and weak appearing, Alert and Oriented HEENT:  [place]/AT, EOMI Neck: Supple, no LAD Lungs: CTA Bilaterally CV: RRR without M/G/R ABM: Soft, NTND, +BS Ext: No C/C/E  Assessment/Plan: 1) Pancreatic pseudocysts. 2) Recent fever. 3) Leukocystosis.   I feel that she has pseudocysts from her pancreatitis.  I do not know if the pseudocysts are infected, however, she has not spiked a fever for the past 48 hours.  If she continues to spike fevers, then needle aspiration of the cysts will be required.  I agree with holding off on antibiotics at this time.  Plan: 1)  Supportive care. 2) Monitor for fever and increasing WBC.  [name] [name] D 12/17/2011, 2:29 PM

## 2011-12-17 NOTE — Progress Notes (Signed)
Patient ID: [name] [name], female   DOB: 02/11/1930, 78 y.o.   MRN: [id number]  Assessment/Plan:   78yoF with h/o DM/HTN, prior pancreatitis several years ago, and recent admission for recurrent pancreatitis presented with INR > 10   Supratherapeutic INR:  - INR has now been corrected w/ Vit K and FFP - INR is still supra therapeutic, 4.2 - coumadin on hold again, suspect from malnutrition and Vit K deficiency  Abdominal pain, recent post ERCP pancreatitis, CT w/ peripancreatic complex collections likely pseudocysts DCed empiric antibiotics yesterday, monitor off antibiotics blood cultures negative so far Dr.Hung's input appreciated  Anemia:  - Hgb stable at 9.2 - no signs of active bleed  Afib:  - Rate is controlled - unable to anticoag for now as discussed above   Hyponatremia: resolved  Diabetes: stable, sliding scale monitoring  Dispo : ambulate, OOB - follow up PT recommendations, suspect will need SNF  Subjective: No events overnight, some strange dreams overnight  Objective:  Vital signs in last 24 hours:  Filed Vitals:   12/17/11 1003 12/17/11 1135 12/17/11 1350 12/17/11 1633  BP: 123/72  110/72 105/61  Pulse: 90 96 120 88  Temp:   99 F (37.2 C) 98.9 F (37.2 C)  TempSrc:   Oral Oral  Resp:   18 18  Height:      Weight:      SpO2:   98% 94%    Intake/Output from previous day:   Intake/Output Summary (Last 24 hours) at 12/17/11 2125 Last data filed at 12/17/11 1453  Gross per 24 hour  Intake    360 ml  Output    600 ml  Net   -240 ml    Physical Exam: General: Alert, awake, oriented x3, in no acute distress. HEENT: No bruits, no goiter. Moist mucous membranes, no scleral icterus, no conjunctival pallor. Heart: Regular rate and rhythm, S1/S2 +, no murmurs, rubs, gallops. Lungs: Clear to auscultation bilaterally. No wheezing, no rhonchi, no rales.  Abdomen: Soft, nontender, nondistended, positive bowel sounds. Extremities: No clubbing or  cyanosis, no pitting edema,  positive pedal pulses. Neuro: Grossly nonfocal.  Lab Results:   Lab 12/15/11 0535 12/14/11 0500 12/13/11 0332 12/12/11 1555 12/12/11 0600  WBC 11.6* 8.9 8.8 10.9* 10.7*  HGB 9.2* 8.9* 8.3* 9.1* 8.8*  HCT 28.0* 27.4* 25.1* 27.3* 26.7*  PLT 429* 422* 379 394 375  MCV 84.8 86.7 85.7 85.3 86.1    Lab 12/15/11 0535 12/14/11 0500 12/13/11 0332 12/11/11 2210  NA 135 140 134* 133*  K 3.5 3.4* 3.9 4.5  CL 101 109 102 99  CO2 22 22 21 21   GLUCOSE 183* 62* 253* 176*  BUN 8 7 12 18   CREATININE 0.92 0.81 0.84 0.93  CALCIUM 9.0 9.1 8.7 9.4    Lab 12/15/11 0535 12/14/11 0500 12/13/11 0332 12/12/11 0600 12/11/11 2210  INR 3.90* 2.96* 1.86* 2.19* >10.00*    Medications: Scheduled Meds:    . atorvastatin  20 mg Oral q1800  . chlorhexidine  15 mL Mouth Rinse BID  . digoxin  0.0625 mg Oral Daily  . diltiazem  120 mg Oral Daily  . fenofibrate  160 mg Oral Daily  . insulin aspart  0-15 Units Subcutaneous TID WC  . insulin aspart  0-5 Units Subcutaneous QHS  . insulin glargine  14 Units Subcutaneous QHS  . latanoprost  1 drop Both Eyes QHS  . lisinopril  5 mg Oral Daily  . methylphenidate  5 mg Oral BID  .  pantoprazole  40 mg Oral Q1200     LOS: 6 days   Bhakti Labella 12/17/2011, 9:25 PM  [facility] Pager: [phone]

## 2011-12-17 NOTE — Progress Notes (Signed)
   CARE MANAGEMENT NOTE 12/17/2011  Patient:  ALIXANDREA, MILLESON   Account Number:  [id number]  Date Initiated:  12/17/2011  Documentation initiated by:  Onnie Boer  Subjective/Objective Assessment:   PT WAS ADMITTED WITH AN > INR     Action/Plan:   PROGRESSION OF CARE AND DISCHARGE PLANNING   Anticipated DC Date:  12/19/2011   Anticipated DC Plan:  HOME W HOME HEALTH SERVICES      DC Planning Services  CM consult      Choice offered to / List presented to:          [facility] arranged  HH-2 PT  HH-3 OT  HH-4 NURSE'S AIDE  HH-1 RN      [facility] agency  [facility]   Status of service:  In process, will continue to follow Medicare Important Message given?   (If response is "NO", the following Medicare IM given date fields will be blank) Date Medicare IM given:   Date Additional Medicare IM given:    Discharge Disposition:    Per UR Regulation:    If discussed at Long Length of Stay Meetings, dates discussed:    Comments:  12/17/11 Onnie Boer, RN, BSN 1519 PT WAS ADMITTED WITH >INR AND ABD PAIN.  PTA PT WAS AT HOME WITH HH PT/OT/RN/AIDE WITH [facility].  WILL F/U WITH OTHER DC NEEDS.

## 2011-12-17 NOTE — Progress Notes (Signed)
ANTICOAGULATION CONSULT NOTE - Follow Up Consult  Pharmacy Consult for Warfarin Indication: Hx atrial fibrillation   Patient Measurements: Height: 5' (152.4 cm) Weight: 124 lb 12.5 oz (56.6 kg) IBW/kg (Calculated) : 45.5   Labs:  Basename 12/17/11 0550 12/16/11 0500 12/15/11 0535  HGB 9.2* 9.7* --  HCT 28.6* 29.1* 28.0*  PLT 464* 471* 429*  APTT -- -- --  LABPROT 41.6* 43.2* 38.8*  INR 4.26* 4.47* 3.90*  HEPARINUNFRC -- -- --  CREATININE 0.90 0.81 0.92  CKTOTAL -- -- --  CKMB -- -- --  TROPONINI -- -- --   Estimated Creatinine Clearance: 39.9 ml/min (by C-G formula based on Cr of 0.9).   Assessment: 78 y.o. F admitted on warfarin PTA for Afib currently being held. The patient was admitted on 3/8 with INR >10 which was reversed with Vit K 10 mg IV and FFP. INR trended down to 1.86 on 3/10 however now is trending back up. INR this morning is SUPRAtherapeutic (INR 4.26 << 4.47, goal of 2-3). The patient has not received any warfarin doses this admission. Hgb/Hct/Plt stable. No s/sx of bleeding noted.   Goal of Therapy:  INR 2-3   Plan:  1. Continue to hold warfarin dose today 2. Will continue to monitor for any signs/symptoms of bleeding and will follow up with PT/INR in the a.m.   Georgina Pillion, PharmD, BCPS Clinical Pharmacist Pager: [phone] 12/17/2011 8:34 AM

## 2011-12-18 LAB — CBC
HCT: 29.2 % — ABNORMAL LOW (ref 36.0–46.0)
Hemoglobin: 9.6 g/dL — ABNORMAL LOW (ref 12.0–15.0)
MCV: 84.9 fL (ref 78.0–100.0)
RDW: 14.2 % (ref 11.5–15.5)
WBC: 12.8 10*3/uL — ABNORMAL HIGH (ref 4.0–10.5)

## 2011-12-18 LAB — GLUCOSE, CAPILLARY
Glucose-Capillary: 108 mg/dL — ABNORMAL HIGH (ref 70–99)
Glucose-Capillary: 144 mg/dL — ABNORMAL HIGH (ref 70–99)

## 2011-12-18 LAB — BASIC METABOLIC PANEL
BUN: 6 mg/dL (ref 6–23)
Chloride: 103 mEq/L (ref 96–112)
Creatinine, Ser: 0.9 mg/dL (ref 0.50–1.10)
GFR calc Af Amer: 69 mL/min — ABNORMAL LOW (ref >90)
Glucose, Bld: 116 mg/dL — ABNORMAL HIGH (ref 70–99)
Potassium: 3.8 mEq/L (ref 3.5–5.1)

## 2011-12-18 LAB — PROTIME-INR: INR: 3.85 — ABNORMAL HIGH (ref 0.00–1.49)

## 2011-12-18 MED ORDER — METHYLPHENIDATE HCL 5 MG PO TABS
5.0000 mg | ORAL_TABLET | Freq: Two times a day (BID) | ORAL | Status: DC
  Administered 2011-12-19 – 2011-12-20 (×4): 5 mg via ORAL
  Filled 2011-12-18 (×4): qty 1

## 2011-12-18 NOTE — Progress Notes (Signed)
Pt experiencing confusion. Alert and oriented to self only. Confused about time, place and situation. Friend at bedside reports pt saying things that were "a little off" within the last 30 mins. Dr Jomarie Longs notified of pt confusion. MD stated pt has had bouts of cofusion before and requested to continue monitoring pt. Will continue to monitor pt. [name], [name]

## 2011-12-18 NOTE — Progress Notes (Signed)
Occupational Therapy Treatment Patient Details Name: [name] [name] MRN: [id number] DOB: August 18, 1930 Today's Date: 12/18/2011  OT Assessment/Plan OT Assessment/Plan Comments on Treatment Session: Pt. progressing very well and with increased activity tolerance. Educated pt on energy conservation strategies with ADLs to increase functional indepnedence OT Plan: Discharge plan remains appropriate OT Frequency: Min 2X/week Follow Up Recommendations: Home health OT;Supervision - Intermittent Equipment Recommended: None recommended by OT OT Goals Acute Rehab OT Goals OT Goal Formulation: With patient Time For Goal Achievement: 7 days ADL Goals Pt Will Perform Lower Body Dressing: with set-up;with supervision;with adaptive equipment;Sit to stand from bed ADL Goal: Lower Body Dressing - Progress: Met Pt Will Transfer to Toilet: with supervision;Ambulation;with DME;3-in-1 ADL Goal: Toilet Transfer - Progress: Met Additional ADL Goal #1: Pt. will use 2 energy conservation techniques with an ADL task to increase activity tolerance ADL Goal: Additional Goal #1 - Progress: Progressing toward goals  OT Treatment Precautions/Restrictions  Precautions Precautions: Fall Required Braces or Orthoses: No Restrictions Weight Bearing Restrictions: No   ADL ADL Grooming: Performed;Wash/dry hands;Wash/dry face;Brushing hair;Set up;Supervision/safety Grooming Details (indicate cue type and reason): Min verbal cues for safety with RW for facing sink  Where Assessed - Grooming: Standing at sink Lower Body Dressing: Performed;Set up Lower Body Dressing Details (indicate cue type and reason): donning bil socks by crossing foot over opposite knee to complete Where Assessed - Lower Body Dressing: Sitting, chair Toilet Transfer: Simulated;Supervision/safety Toilet Transfer Details (indicate cue type and reason): Min verbal cues for hand placement Toilet Transfer Method: Ambulating Toilet Transfer  Equipment: Other (comment) (recliner) Mobility  Bed Mobility Bed Mobility: No (Pt. presented sitting EOB) Transfers Transfers: Yes Sit to Stand: 5: Supervision;With upper extremity assist;From bed Sit to Stand Details (indicate cue type and reason): Min verbal cues for safe hand placement     End of Session OT - End of Session Equipment Utilized During Treatment: Gait belt Activity Tolerance: Patient tolerated treatment well Patient left: with call bell in reach;in chair Nurse Communication: Mobility status for transfers General Behavior During Session: [facility] for tasks performed Cognition: [facility] for tasks performed  Adilenne Ashworth, OTR/L Pager [phone]  12/18/2011, 8:32 AM

## 2011-12-18 NOTE — Progress Notes (Signed)
Patient ID: Harsimran Westman, female   DOB: [birth year], 78 y.o.   MRN: [id number] Subjective: Feeling weak.  No abdominal pain.  Objective: Vital signs in last 24 hours: Temp:  [98.6 F (37 C)-99.3 F (37.4 C)] 98.6 F (37 C) (03/15 1000) Pulse Rate:  [88-120] 88  (03/15 1000) Resp:  [16-20] 20  (03/15 1000) BP: (101-119)/(55-80) 119/80 mmHg (03/15 1000) SpO2:  [94 %-98 %] 98 % (03/15 1000) Weight:  [54.2 kg (119 lb 7.8 oz)] 54.2 kg (119 lb 7.8 oz) (03/14 2200) Last BM Date: 12/17/11  Intake/Output from previous day: 03/14 0701 - 03/15 0700 In: 360 [P.O.:360] Out: 1125 [Urine:1125] Intake/Output this shift: Total I/O In: 120 [P.O.:120] Out: -   General appearance: alert, no distress and frail and weak appearing GI: soft, non-tender; bowel sounds normal; no masses,  no organomegaly  Lab Results:  Basename 12/18/11 0700 12/17/11 0550 12/16/11 0500  WBC 12.8* 11.3* 11.4*  HGB 9.6* 9.2* 9.7*  HCT 29.2* 28.6* 29.1*  PLT 510* 464* 471*   BMET  Basename 12/18/11 0700 12/17/11 0550 12/16/11 0500  NA 139 139 139  K 3.8 3.8 3.1*  CL 103 106 103  CO2 25 24 24   GLUCOSE 116* 97 81  BUN 6 8 7   CREATININE 0.90 0.90 0.81  CALCIUM 9.7 9.4 9.3   LFT No results found for this basename: PROT,ALBUMIN,AST,ALT,ALKPHOS,BILITOT,BILIDIR,IBILI in the last 72 hours PT/INR  Basename 12/18/11 0700 12/17/11 0550  LABPROT 38.4* 41.6*  INR 3.85* 4.26*   Hepatitis Panel No results found for this basename: HEPBSAG,HCVAB,HEPAIGM,HEPBIGM in the last 72 hours C-Diff No results found for this basename: CDIFFTOX:3 in the last 72 hours Fecal Lactopherrin No results found for this basename: FECLLACTOFRN in the last 72 hours  Studies/Results: No results found.  Medications:  Scheduled:   . antiseptic oral rinse  15 mL Mouth Rinse q12n4p  . atorvastatin  20 mg Oral q1800  . chlorhexidine  15 mL Mouth Rinse BID  . digoxin  0.0625 mg Oral Daily  . diltiazem  120 mg Oral Daily  .  fenofibrate  160 mg Oral Daily  . insulin aspart  0-15 Units Subcutaneous TID WC  . insulin aspart  0-5 Units Subcutaneous QHS  . insulin glargine  14 Units Subcutaneous QHS  . latanoprost  1 drop Both Eyes QHS  . lisinopril  5 mg Oral Daily  . methylphenidate  5 mg Oral BID  . pantoprazole  40 mg Oral Q1200  . Warfarin - Pharmacist Dosing Inpatient   Does not apply q1800   Continuous:   Assessment/Plan: 1) Probable pancreatic pseudocysts. 2) Leukocystosis   Clinically, from the GI standpoint, she is stable.  She is very weak appearing.  I do not necessarily think that it is coming from her pancreas.  I noted that her WBC has increased mildly, but no fever.  Plan: 1) Continue to follow for fever and WBC.  LOS: 7 days   Armanie Martine D 12/18/2011, 10:58 AM

## 2011-12-18 NOTE — Progress Notes (Signed)
ANTICOAGULATION CONSULT NOTE - Follow Up Consult  Pharmacy Consult for Warfarin Indication: Hx atrial fibrillation   Patient Measurements: Height: 5' (152.4 cm) Weight: 119 lb 7.8 oz (54.2 kg) IBW/kg (Calculated) : 45.5   Labs:  Basename 12/18/11 0700 12/17/11 0550 12/16/11 0500  HGB 9.6* 9.2* --  HCT 29.2* 28.6* 29.1*  PLT 510* 464* 471*  APTT -- -- --  LABPROT 38.4* 41.6* 43.2*  INR 3.85* 4.26* 4.47*  HEPARINUNFRC -- -- --  CREATININE 0.90 0.90 0.81  CKTOTAL -- -- --  CKMB -- -- --  TROPONINI -- -- --   Estimated Creatinine Clearance: 36.4 ml/min (by C-G formula based on Cr of 0.9).   Assessment: 78 y.o. F admitted on warfarin PTA for Afib currently being held. The patient was admitted on 3/8 with INR >10 which was reversed with Vit K 10 mg IV and FFP. INR trended down to 1.86 on 3/10 however now is trended back up. INR this morning is SUPRAtherapeutic (INR 4.26-->3.85). The patient has not received any warfarin doses this admission.  - H/H and Plts stable - No significant bleeding reported  Goal of Therapy:  INR 2-3   Plan:  1. Continue to hold warfarin dose today 2. Will continue to monitor for any signs/symptoms of bleeding and will follow up with PT/INR in the a.m.   Cleon Dew, PharmD [phone] 12/18/2011 11:58 AM

## 2011-12-18 NOTE — Progress Notes (Signed)
Patient ID: [name] [name], female   DOB: [birth year], 78 y.o.   MRN: [id number]  Assessment/Plan:   78yoF with h/o DM/HTN, prior pancreatitis several years ago, and recent admission for recurrent pancreatitis presented with INR > 10   Supratherapeutic INR:  - INR has now been corrected w/ Vit K and FFP - INR slowly trending down is still supra therapeutic, 3.8 - coumadin on hold again, suspect from malnutrition and Vit K deficiency  Abdominal pain, recent post ERCP pancreatitis, CT w/ peripancreatic complex collections likely pseudocysts DCed empiric antibiotics on 3/13, monitor off antibiotics blood cultures negative so far Dr.Hung's input appreciated Encourage PO, supportive care  Anemia:  - Hgb stable at 9.2 - no signs of active bleed  Afib:  - Rate is controlled - unable to anticoag for now as discussed above   Hyponatremia: resolved  Diabetes: stable, sliding scale monitoring  Dispo : ambulate, OOB Ambulating with PT ?home soon   Subjective: No events overnight, nausea this am, ate lunch and dinner yesterday  Objective:  Vital signs in last 24 hours:  Filed Vitals:   12/17/11 1633 12/17/11 2200 12/18/11 0603 12/18/11 1000  BP: 105/61 116/69 101/55 119/80  Pulse: 88 113 95 88  Temp: 98.9 F (37.2 C) 99.3 F (37.4 C) 98.6 F (37 C) 98.6 F (37 C)  TempSrc: Oral Oral Oral Oral  Resp: 18 16 16 20   Height:      Weight:  54.2 kg (119 lb 7.8 oz)    SpO2: 94% 95% 95% 98%    Intake/Output from previous day:   Intake/Output Summary (Last 24 hours) at 12/18/11 1432 Last data filed at 12/18/11 1400  Gross per 24 hour  Intake    240 ml  Output   1626 ml  Net  -1386 ml    Physical Exam: General: Alert, awake, oriented x3, in no acute distress. HEENT: No bruits, no goiter. Moist mucous membranes, no scleral icterus, no conjunctival pallor. Heart: Regular rate and rhythm, S1/S2 +, no murmurs, rubs, gallops. Lungs: Clear to auscultation bilaterally. No  wheezing, no rhonchi, no rales.  Abdomen: Soft, nontender, nondistended, positive bowel sounds. Extremities: No clubbing or cyanosis, no pitting edema,  positive pedal pulses. Neuro: Grossly nonfocal.  Lab Results:   Lab 12/15/11 0535 12/14/11 0500 12/13/11 0332 12/12/11 1555 12/12/11 0600  WBC 11.6* 8.9 8.8 10.9* 10.7*  HGB 9.2* 8.9* 8.3* 9.1* 8.8*  HCT 28.0* 27.4* 25.1* 27.3* 26.7*  PLT 429* 422* 379 394 375  MCV 84.8 86.7 85.7 85.3 86.1    Lab 12/15/11 0535 12/14/11 0500 12/13/11 0332 12/11/11 2210  NA 135 140 134* 133*  K 3.5 3.4* 3.9 4.5  CL 101 109 102 99  CO2 22 22 21 21   GLUCOSE 183* 62* 253* 176*  BUN 8 7 12 18   CREATININE 0.92 0.81 0.84 0.93  CALCIUM 9.0 9.1 8.7 9.4    Lab 12/15/11 0535 12/14/11 0500 12/13/11 0332 12/12/11 0600 12/11/11 2210  INR 3.90* 2.96* 1.86* 2.19* >10.00*    Medications: Scheduled Meds:    . atorvastatin  20 mg Oral q1800  . chlorhexidine  15 mL Mouth Rinse BID  . digoxin  0.0625 mg Oral Daily  . diltiazem  120 mg Oral Daily  . fenofibrate  160 mg Oral Daily  . insulin aspart  0-15 Units Subcutaneous TID WC  . insulin aspart  0-5 Units Subcutaneous QHS  . insulin glargine  14 Units Subcutaneous QHS  . latanoprost  1 drop Both Eyes QHS  .  lisinopril  5 mg Oral Daily  . methylphenidate  5 mg Oral BID  . pantoprazole  40 mg Oral Q1200     LOS: 7 days   Segundo Makela 12/18/2011, 2:32 PM  [facility] Pager: [phone]

## 2011-12-18 NOTE — Progress Notes (Signed)
Physical Therapy Treatment Patient Details Name: [name] [name] MRN: [id number] DOB: [birth year] Today's Date: 12/18/2011  PT Assessment/Plan  PT - Assessment/Plan Comments on Treatment Session: Pt progressing very well. Will see once more to ensure safety with ambulation before deciding on signing off from acute PT PT Plan: Discharge plan remains appropriate PT Frequency: Min 3X/week Follow Up Recommendations: Home health PT;Supervision/Assistance - 24 hour Equipment Recommended: None recommended by PT PT Goals  Acute Rehab PT Goals PT Goal: Supine/Side to Sit - Progress: Met PT Goal: Sit to Supine/Side - Progress: Met PT Goal: Sit to Stand - Progress: Met PT Goal: Stand to Sit - Progress: Met PT Goal: Ambulate - Progress: Progressing toward goal PT Goal: Perform Home Exercise Program - Progress: Progressing toward goal  PT Treatment Precautions/Restrictions  Precautions Precautions: Fall Required Braces or Orthoses: No Restrictions Weight Bearing Restrictions: No Mobility (including Balance) Bed Mobility Supine to Sit: 6: Modified independent (Device/Increase time) Sit to Supine: 6: Modified independent (Device/Increase time) Transfers Sit to Stand: 6: Modified independent (Device/Increase time) Stand to Sit: 6: Modified independent (Device/Increase time) Ambulation/Gait Ambulation/Gait Assistance: Other (comment) (MinGuard A) Ambulation/Gait Assistance Details (indicate cue type and reason): Cues for RW safety Ambulation Distance (Feet): 160 Feet Assistive device: Rolling walker Gait Pattern: Decreased stride length;Trunk flexed    Exercise  General Exercises - Lower Extremity Long Arc Quad: AROM;Both;10 reps Hip Flexion/Marching: AROM;Both;10 reps End of Session PT - End of Session Equipment Utilized During Treatment: Gait belt Activity Tolerance: Patient tolerated treatment well Patient left: in bed General Behavior During Session: [facility] for tasks  performed Cognition: [facility] for tasks performed  Kaveon Blatz, Adline Potter 12/18/2011, 2:52 PM 12/18/2011 Fredrich Birks PTA [phone] pager [phone] office

## 2011-12-19 LAB — CBC
Hemoglobin: 9.7 g/dL — ABNORMAL LOW (ref 12.0–15.0)
MCH: 27.9 pg (ref 26.0–34.0)
Platelets: 522 10*3/uL — ABNORMAL HIGH (ref 150–400)
RBC: 3.48 MIL/uL — ABNORMAL LOW (ref 3.87–5.11)
WBC: 10.5 10*3/uL (ref 4.0–10.5)

## 2011-12-19 LAB — BASIC METABOLIC PANEL
CO2: 22 mEq/L (ref 19–32)
Calcium: 9.3 mg/dL (ref 8.4–10.5)
Chloride: 104 mEq/L (ref 96–112)
Glucose, Bld: 140 mg/dL — ABNORMAL HIGH (ref 70–99)
Sodium: 139 mEq/L (ref 135–145)

## 2011-12-19 LAB — GLUCOSE, CAPILLARY
Glucose-Capillary: 147 mg/dL — ABNORMAL HIGH (ref 70–99)
Glucose-Capillary: 193 mg/dL — ABNORMAL HIGH (ref 70–99)
Glucose-Capillary: 138 mg/dL — ABNORMAL HIGH (ref 70–99)

## 2011-12-19 LAB — PROTIME-INR: Prothrombin Time: 43 seconds — ABNORMAL HIGH (ref 11.6–15.2)

## 2011-12-19 MED ORDER — PHYTONADIONE 5 MG PO TABS
2.5000 mg | ORAL_TABLET | Freq: Once | ORAL | Status: AC
  Administered 2011-12-19: 2.5 mg via ORAL
  Filled 2011-12-19: qty 1

## 2011-12-19 NOTE — Progress Notes (Signed)
Patient ID: [name] [name], female   DOB: 05-15-1930, 78 y.o.   MRN: [id number]  Assessment/Plan:   [name] with h/o DM/HTN, prior pancreatitis several years ago, and recent admission for recurrent pancreatitis presented with INR > 10   Supratherapeutic INR:  Treated with Vit K and FFP - INR slowly trending up again, give vitamin K, suspect from malnutrition and Vit K deficiency  Abdominal pain, recent post ERCP pancreatitis, CT w/ peripancreatic complex collections likely pseudocysts DCed empiric antibiotics on 3/13, monitor off antibiotics blood cultures negative so far Dr.Hung's input appreciated Encourage PO, supportive care Close FU with Dr.Hung   Anemia:  - Hgb stable at 9.2 - no signs of active bleed  Afib:  - Rate is controlled - unable to anticoag for now as discussed above   Hyponatremia: resolved  Diabetes: stable, sliding scale monitoring  Dispo : ambulate, OOB Ambulating with PT ?home tomorrow   Subjective: No events overnight, nausea this am, ate lunch and dinner yesterday  Objective:  Vital signs in last 24 hours:  Filed Vitals:   12/18/11 1800 12/18/11 2126 12/19/11 0456 12/19/11 0835  BP: 117/52 98/62 127/70 117/65  Pulse: 62 89 123 88  Temp: 99 F (37.2 C) 98.9 F (37.2 C) 98.1 F (36.7 C) 97.9 F (36.6 C)  TempSrc: Oral Oral Oral Oral  Resp: 20 19 20 18   Height:      Weight:  55.1 kg (121 lb 7.6 oz)    SpO2: 100% 99% 98% 100%    Intake/Output from previous day:   Intake/Output Summary (Last 24 hours) at 12/19/11 1251 Last data filed at 12/19/11 0835  Gross per 24 hour  Intake    480 ml  Output   1503 ml  Net  -1023 ml    Physical Exam: General: Alert, awake, oriented x3, in no acute distress. HEENT: No bruits, no goiter. Moist mucous membranes, no scleral icterus, no conjunctival pallor. Heart: Regular rate and rhythm, S1/S2 +, no murmurs, rubs, gallops. Lungs: Clear to auscultation bilaterally. No wheezing, no rhonchi, no  rales.  Abdomen: Soft, nontender, nondistended, positive bowel sounds. Extremities: No clubbing or cyanosis, no pitting edema,  positive pedal pulses. Neuro: Grossly nonfocal.  Lab Results:   Lab 12/15/11 0535 12/14/11 0500 12/13/11 0332 12/12/11 1555 12/12/11 0600  WBC 11.6* 8.9 8.8 10.9* 10.7*  HGB 9.2* 8.9* 8.3* 9.1* 8.8*  HCT 28.0* 27.4* 25.1* 27.3* 26.7*  PLT 429* 422* 379 394 375  MCV 84.8 86.7 85.7 85.3 86.1    Lab 12/15/11 0535 12/14/11 0500 12/13/11 0332 12/11/11 2210  NA 135 140 134* 133*  K 3.5 3.4* 3.9 4.5  CL 101 109 102 99  CO2 22 22 21 21   GLUCOSE 183* 62* 253* 176*  BUN 8 7 12 18   CREATININE 0.92 0.81 0.84 0.93  CALCIUM 9.0 9.1 8.7 9.4    Lab 12/15/11 0535 12/14/11 0500 12/13/11 0332 12/12/11 0600 12/11/11 2210  INR 3.90* 2.96* 1.86* 2.19* >10.00*    Medications: Scheduled Meds:    . atorvastatin  20 mg Oral q1800  . chlorhexidine  15 mL Mouth Rinse BID  . digoxin  0.0625 mg Oral Daily  . diltiazem  120 mg Oral Daily  . fenofibrate  160 mg Oral Daily  . insulin aspart  0-15 Units Subcutaneous TID WC  . insulin aspart  0-5 Units Subcutaneous QHS  . insulin glargine  14 Units Subcutaneous QHS  . latanoprost  1 drop Both Eyes QHS  . lisinopril  5 mg  Oral Daily  . methylphenidate  5 mg Oral BID  . pantoprazole  40 mg Oral Q1200     LOS: 8 days   [name] [name] 12/19/2011, 12:51 PM  [facility] Pager: [phone]

## 2011-12-19 NOTE — Progress Notes (Signed)
ANTICOAGULATION CONSULT NOTE - Follow Up Consult  Pharmacy Consult for Warfarin Indication: Hx atrial fibrillation   Assessment: 78 yo female on warfarin PTA for Afib currently being held due to elevated INR. The patient was admitted on 3/8 with INR >10 which was reversed with Vit K 10 mg IV and FFP. INR trended down to 1.86 on 3/10, however now is trended back up. INR this morning continues to increase without coumadin being given this admission (INR 4.45-->3.85).  No bleeding issues reported  Home coumadin dose: 4mg  daily  Goal of Therapy:  INR 2-3   Plan:  1. Continue to hold warfarin today 2. Will continue to monitor for any signs/symptoms of bleeding and will follow up with PT/INR in the AM  Bayard Beaver. Saul Fordyce, PharmD Pager: [phone]  12/19/2011 8:37 AM   Patient Measurements: Height: 5' (152.4 cm) Weight: 121 lb 7.6 oz (55.1 kg) IBW/kg (Calculated) : 45.5   Labs:  Basename 12/19/11 0645 12/18/11 0700 12/17/11 0550  HGB 9.7* 9.6* --  HCT 29.7* 29.2* 28.6*  PLT 522* 510* 464*  APTT -- -- --  LABPROT -- 38.4* 41.6*  INR -- 3.85* 4.26*  HEPARINUNFRC -- -- --  CREATININE -- 0.90 0.90  CKTOTAL -- -- --  CKMB -- -- --  TROPONINI -- -- --   Estimated Creatinine Clearance: 39.4 ml/min (by C-G formula based on Cr of 0.9).

## 2011-12-20 DIAGNOSIS — K859 Acute pancreatitis without necrosis or infection, unspecified: Secondary | ICD-10-CM

## 2011-12-20 LAB — BASIC METABOLIC PANEL
BUN: 9 mg/dL (ref 6–23)
Calcium: 9.5 mg/dL (ref 8.4–10.5)
Chloride: 104 mEq/L (ref 96–112)
Creatinine, Ser: 0.95 mg/dL (ref 0.50–1.10)
GFR calc Af Amer: 64 mL/min — ABNORMAL LOW (ref >90)

## 2011-12-20 LAB — CBC
HCT: 30.7 % — ABNORMAL LOW (ref 36.0–46.0)
MCH: 27.7 pg (ref 26.0–34.0)
MCHC: 32.6 g/dL (ref 30.0–36.0)
MCV: 85 fL (ref 78.0–100.0)
Platelets: 526 10*3/uL — ABNORMAL HIGH (ref 150–400)
RDW: 14.4 % (ref 11.5–15.5)

## 2011-12-20 LAB — GLUCOSE, CAPILLARY: Glucose-Capillary: 133 mg/dL — ABNORMAL HIGH (ref 70–99)

## 2011-12-20 MED ORDER — HYDROCODONE-ACETAMINOPHEN 5-500 MG PO TABS: 1.0000 | ORAL_TABLET | Freq: Four times a day (QID) | ORAL | Status: AC | PRN

## 2011-12-20 MED ORDER — ASPIRIN EC 81 MG PO TBEC: 81.0000 mg | DELAYED_RELEASE_TABLET | Freq: Every day | ORAL | Status: DC

## 2011-12-20 MED ORDER — HYOSCYAMINE SULFATE 0.125 MG SL SUBL: 0.1250 mg | SUBLINGUAL_TABLET | SUBLINGUAL | Status: DC | PRN

## 2011-12-20 MED ORDER — WARFARIN SODIUM 4 MG PO TABS
4.0000 mg | ORAL_TABLET | Freq: Once | ORAL | Status: DC
  Filled 2011-12-20: qty 1

## 2011-12-20 MED ORDER — WARFARIN - PHARMACIST DOSING INPATIENT: Freq: Every day | Status: DC

## 2011-12-20 NOTE — Progress Notes (Signed)
I am covering this patient today for Drs. Nicholes Mango.   Subjective: No significant pains but did require a tylenol last night to help her sleep.  She seems a bit confused, but this is the first time i've met her and it may be her baseline.  No nausea, vomiting but still a pretty poor appetite.  She ambulates with assist   Objective: Vital signs in last 24 hours: Temp:  [97.9 F (36.6 C)-99.5 F (37.5 C)] 98.1 F (36.7 C) (03/17 0453) Pulse Rate:  [82-123] 95  (03/17 0453) Resp:  [18] 18  (03/17 0453) BP: (110-126)/(65-80) 118/68 mmHg (03/17 0453) SpO2:  [96 %-100 %] 98 % (03/17 0453) Weight:  [119 lb 7.8 oz (54.2 kg)] 119 lb 7.8 oz (54.2 kg) (03/16 2053) Last BM Date: 12/19/11 General: alert and oriented times 2 or 3 Heart: regular rate and rythm Abdomen: soft, non-tender, non-distended, normal bowel sounds   Lab Results:  Basename 12/19/11 0645 12/18/11 0700  WBC 10.5 12.8*  HGB 9.7* 9.6*  PLT 522* 510*  MCV 85.3 84.9   BMET  Basename 12/19/11 0645 12/18/11 0700  NA 139 139  K 3.4* 3.8  CL 104 103  CO2 22 25  GLUCOSE 140* 116*  BUN 7 6  CREATININE 0.84 0.90  CALCIUM 9.3 9.7    Basename 12/19/11 0645 12/18/11 0700  INR 4.45* 3.85*    Assessment/Plan: 78 y.o. female with recent pancreatitis  She feels she is probably well enough to go home today.  Her INR was still quite high yesterday and so coumadin should be held at least a couple more days.  She does not have a scheduled appt with Dr. Elnoria Howard at this point but I will communicate with him at sign out later today that she will need one.    Rob Bunting, MD  12/20/2011, 6:56 AM [facility] Pager [phone]

## 2011-12-20 NOTE — Discharge Summary (Signed)
Physician Discharge Summary  Patient ID: [name] [name] MRN: [id number] DOB/AGE: 07-Sep-1930 59 y.o.  Admit date: 12/11/2011 Discharge date: 12/20/2011  Primary Care Physician:  Rocky Morel, MD Gi- Dr. Elnoria Howard  Discharge Diagnoses:   1. recent post ERCP pancreatitis 2. pancreatic pseudocysts 3. chronic abdominal pain 4. severe coagulopathy 5. history of paroxysmal atrial fibrillation 6. diabetes mellitus 7. history of GI bleeds 8. Glaucoma 9. Dyslipidemia 10. GERD 11. History of abdominal hysterectomy 12. History of cholecystectomy 13. Protein calorie malnutrition  Medication List  As of 12/20/2011  1:16 PM   STOP taking these medications         ciprofloxacin 500 MG tablet      glimepiride 4 MG tablet      hyoscyamine 0.125 MG Tbdp      LACTASE PO      warfarin 2 MG tablet         TAKE these medications         acetaminophen 500 MG tablet   Commonly known as: TYLENOL   Take 500 mg by mouth every 6 (six) hours as needed. for pain.      ALPRAZolam 1 MG tablet   Commonly known as: XANAX   Take 1 tablet (1 mg total) by mouth at bedtime as needed.      aspirin EC 81 MG tablet   Take 1 tablet (81 mg total) by mouth daily.      CENTRUM SILVER PO   Take 1 tablet by mouth daily.      digoxin 0.125 MG tablet   Commonly known as: LANOXIN   Take 1 tablet (0.125 mg total) by mouth daily.      diltiazem 120 MG 24 hr capsule   Commonly known as: CARDIZEM CD   Take 1 capsule (120 mg total) by mouth daily.      fenofibrate 160 MG tablet   Take 1 tablet (160 mg total) by mouth daily.      HYDROcodone-acetaminophen 5-500 MG per tablet   Commonly known as: VICODIN   Take 1 tablet by mouth every 6 (six) hours as needed for pain (for severe pain not controlled with tylenol).      hyoscyamine 0.125 MG SL tablet   Commonly known as: LEVSIN SL   Place 1 tablet (0.125 mg total) under the tongue every 4 (four) hours as needed (for stomach cramping).      lisinopril 5  MG tablet   Commonly known as: PRINIVIL,ZESTRIL   Take 1 tablet (5 mg total) by mouth daily.      methylphenidate 5 MG tablet   Commonly known as: RITALIN   Take 5 mg by mouth 2 (two) times daily.      omeprazole 20 MG capsule   Commonly known as: PRILOSEC   Take 20 mg by mouth 2 (two) times daily.      simvastatin 40 MG tablet   Commonly known as: ZOCOR   Take 40 mg by mouth every evening.      XALATAN OP   Apply 1 drop to eye at bedtime.             Disposition and Follow-up:  1. Dr. Elnoria Howard in one week 2. Dr. Rocky Morel in one week  Consults: Dr. Elnoria Howard GI  Significant Diagnostic Studies:  No results found.  Brief H and P: [name] [name] is a 78 year old white female with recent prolonged hospitalization for pancreatitis and post ERCP pancreatitis, and paroxysmal atrial fibrillation who had just been started on  anticoagulation in February presented to the ER with an INR greater than 10. And acute on chronic abdominal pain with low-grade fevers.  Hospital Course:  1. coagulopathy secondary to Coumadin exacerbated by malnutrition and vitamin K deficiency She was originally treated with vitamin K and FFP on admission, despite that her INR hovered in the 4-4.5 range without any Coumadin for 8 days. She finally received another dose of vitamin K yesterday and her INR has come down to 2.2 today. She has not had any further episodes of atrial fibrillation. Given difficulties with getting her INR regulated, and history of GI bleeds, we have decided to hold off on restarting Coumadin short-term. She started on aspirin now and is advised to followup with Dr. Daryll Brod in one week and have her INR checked again. If it stabilizes and patient is agreeable to go back on Coumadin this can be restarted as outpatient without a bridge. 2. pancreatic pseudocysts: She had a CT scan on admission which showed peripancreatic complex collections which were small and different in location from before.  Was started on empiric antibiotics on admission which was subsequently discontinued and she has remained stable off antibiotics for the last 5 days. Has been seen and followed by Dr. Elnoria Howard through her hospitalization who recommended supportive care. She is currently eating better than before and her pain is relatively well-controlled. She is advised to followup with Dr. Elnoria Howard in one to 2 weeks.   Time spent on Discharge:  Signed: Albirta Rhinehart [facility]  12/20/2011, 1:16 PM

## 2011-12-20 NOTE — Progress Notes (Signed)
ANTICOAGULATION CONSULT NOTE - Follow Up Consult  Pharmacy Consult for Warfarin Indication: Hx atrial fibrillation   Assessment: 78 yo female on warfarin PTA for Afib which was being held due to elevated INR.  Her INR today is therapeutic in range at 2.12 after reversal with Vitamin K 10mg  IV & 2.5mg  PO + FFP.  No bleeding issues reported. Will restart coumadin today as I suspect INR will continue to decline.  Home coumadin dose: 4mg  daily  Goal of Therapy:  INR 2-3   Plan:  1. Coumadin 4mg  PO x1 today (if patient still here) 2. If patient discharged, would recommend restarting at home dose with close outpatient follow-up.  Bayard Beaver. Saul Fordyce, PharmD Pager: [phone]  12/20/2011 9:16 AM   Patient Measurements: Height: 5' (152.4 cm) Weight: 119 lb 7.8 oz (54.2 kg) IBW/kg (Calculated) : 45.5   Labs:  Basename 12/20/11 0652 12/19/11 0645 12/18/11 0700  HGB 10.0* 9.7* --  HCT 30.7* 29.7* 29.2*  PLT 526* 522* 510*  APTT -- -- --  LABPROT 24.1* 43.0* 38.4*  INR 2.12* 4.45* 3.85*  HEPARINUNFRC -- -- --  CREATININE 0.95 0.84 0.90  CKTOTAL -- -- --  CKMB -- -- --  TROPONINI -- -- --   Estimated Creatinine Clearance: 34.5 ml/min (by C-G formula based on Cr of 0.95).

## 2011-12-21 NOTE — Progress Notes (Signed)
   CARE MANAGEMENT NOTE 12/21/2011  Patient:  [name] [name], [name] [name]   Account Number:  [id number]  Date Initiated:  12/17/2011  Documentation initiated by:  Onnie Boer  Subjective/Objective Assessment:   PT WAS ADMITTED WITH AN > INR     Action/Plan:   PROGRESSION OF CARE AND DISCHARGE PLANNING   Anticipated DC Date:  12/19/2011   Anticipated DC Plan:  HOME W HOME HEALTH SERVICES      DC Planning Services  CM consult      Choice offered to / List presented to:          HH arranged  HH-2 PT  HH-3 OT  HH-4 NURSE'S AIDE  HH-1 RN  HH-6 SOCIAL WORKER      HH agency  [facility]   Status of service:  In process, will continue to follow Medicare Important Message given?   (If response is "NO", the following Medicare IM given date fields will be blank) Date Medicare IM given:   Date Additional Medicare IM given:    Discharge Disposition:    Per UR Regulation:    If discussed at Long Length of Stay Meetings, dates discussed:    Comments:  12/21/11 Onnie Boer, RN,BSN PT WAS DC'D TO HOME WITH [facility] FROM [facility]  12/18/2011 2:30 pm [name] RN, CCM Phone call to [facility] representative [facility] regarding discharge planning for patient, RN, PT, OT, HHA and SW. She states the patient is active with agency. CM to continue to follow regarding discharge needs.   12/17/11 Onnie Boer, RN, BSN 1519 PT WAS ADMITTED WITH >INR AND ABD PAIN.  PTA PT WAS AT HOME WITH HH PT/OT/RN/AIDE WITH [facility].  WILL F/U WITH OTHER DC NEEDS.

## 2011-12-22 LAB — CULTURE, BLOOD (ROUTINE X 2): Culture  Setup Time: 201303130450

## 2011-12-30 ENCOUNTER — Other Ambulatory Visit: Payer: Self-pay | Admitting: Gastroenterology

## 2011-12-30 DIAGNOSIS — K859 Acute pancreatitis without necrosis or infection, unspecified: Secondary | ICD-10-CM

## 2011-12-30 DIAGNOSIS — R1013 Epigastric pain: Secondary | ICD-10-CM

## 2012-01-12 ENCOUNTER — Ambulatory Visit
Admission: RE | Admit: 2012-01-12 | Discharge: 2012-01-12 | Disposition: A | Discharge status: Home / Self Care | Payer: Medicare Other | Source: Ambulatory Visit | Attending: Gastroenterology | Admitting: Gastroenterology

## 2012-01-12 DIAGNOSIS — K859 Acute pancreatitis without necrosis or infection, unspecified: Secondary | ICD-10-CM

## 2012-01-12 DIAGNOSIS — R1013 Epigastric pain: Secondary | ICD-10-CM

## 2012-01-12 MED ORDER — IOHEXOL 300 MG/ML  SOLN
100.0000 mL | Freq: Once | INTRAMUSCULAR | Status: AC | PRN
  Administered 2012-01-12: 100 mL via INTRAVENOUS

## 2012-01-14 ENCOUNTER — Inpatient Hospital Stay (HOSPITAL_COMMUNITY)
Admission: EM | Admit: 2012-01-14 | Discharge: 2012-01-16 | DRG: 440 | Disposition: A | Discharge status: Home / Self Care | Payer: Medicare Other | Attending: Internal Medicine | Admitting: Internal Medicine

## 2012-01-14 ENCOUNTER — Encounter (HOSPITAL_COMMUNITY): Payer: Self-pay

## 2012-01-14 DIAGNOSIS — Z7982 Long term (current) use of aspirin: Secondary | ICD-10-CM

## 2012-01-14 DIAGNOSIS — E7800 Pure hypercholesterolemia, unspecified: Secondary | ICD-10-CM

## 2012-01-14 DIAGNOSIS — N390 Urinary tract infection, site not specified: Secondary | ICD-10-CM

## 2012-01-14 DIAGNOSIS — D689 Coagulation defect, unspecified: Secondary | ICD-10-CM

## 2012-01-14 DIAGNOSIS — E876 Hypokalemia: Secondary | ICD-10-CM

## 2012-01-14 DIAGNOSIS — K861 Other chronic pancreatitis: Secondary | ICD-10-CM | POA: Diagnosis present

## 2012-01-14 DIAGNOSIS — D72829 Elevated white blood cell count, unspecified: Secondary | ICD-10-CM

## 2012-01-14 DIAGNOSIS — I4891 Unspecified atrial fibrillation: Secondary | ICD-10-CM

## 2012-01-14 DIAGNOSIS — I10 Essential (primary) hypertension: Secondary | ICD-10-CM | POA: Diagnosis present

## 2012-01-14 DIAGNOSIS — J189 Pneumonia, unspecified organism: Secondary | ICD-10-CM

## 2012-01-14 DIAGNOSIS — D649 Anemia, unspecified: Secondary | ICD-10-CM

## 2012-01-14 DIAGNOSIS — R627 Adult failure to thrive: Secondary | ICD-10-CM | POA: Diagnosis present

## 2012-01-14 DIAGNOSIS — E871 Hypo-osmolality and hyponatremia: Secondary | ICD-10-CM

## 2012-01-14 DIAGNOSIS — M545 Low back pain, unspecified: Secondary | ICD-10-CM | POA: Diagnosis present

## 2012-01-14 DIAGNOSIS — K859 Acute pancreatitis without necrosis or infection, unspecified: Principal | ICD-10-CM | POA: Diagnosis present

## 2012-01-14 DIAGNOSIS — E119 Type 2 diabetes mellitus without complications: Secondary | ICD-10-CM | POA: Diagnosis present

## 2012-01-14 DIAGNOSIS — K922 Gastrointestinal hemorrhage, unspecified: Secondary | ICD-10-CM

## 2012-01-14 DIAGNOSIS — R509 Fever, unspecified: Secondary | ICD-10-CM

## 2012-01-14 DIAGNOSIS — H409 Unspecified glaucoma: Secondary | ICD-10-CM

## 2012-01-14 HISTORY — DX: Low back pain, unspecified: M54.50

## 2012-01-14 HISTORY — DX: Type 2 diabetes mellitus without complications: E11.9

## 2012-01-14 HISTORY — DX: Depression, unspecified: F32.A

## 2012-01-14 HISTORY — DX: Major depressive disorder, single episode, unspecified: F32.9

## 2012-01-14 HISTORY — DX: Pneumonia, unspecified organism: J18.9

## 2012-01-14 HISTORY — DX: Other chronic pain: G89.29

## 2012-01-14 HISTORY — DX: Low back pain: M54.5

## 2012-01-14 LAB — COMPREHENSIVE METABOLIC PANEL
Albumin: 3.6 g/dL (ref 3.5–5.2)
Alkaline Phosphatase: 68 U/L (ref 39–117)
BUN: 17 mg/dL (ref 6–23)
CO2: 19 mEq/L (ref 19–32)
Chloride: 101 mEq/L (ref 96–112)
Creatinine, Ser: 0.99 mg/dL (ref 0.50–1.10)
GFR calc non Af Amer: 53 mL/min — ABNORMAL LOW (ref >90)
Glucose, Bld: 158 mg/dL — ABNORMAL HIGH (ref 70–99)
Potassium: 4.4 mEq/L (ref 3.5–5.1)
Total Bilirubin: 0.6 mg/dL (ref 0.3–1.2)

## 2012-01-14 LAB — CREATININE, SERUM
Creatinine, Ser: 0.99 mg/dL (ref 0.50–1.10)
GFR calc Af Amer: 61 mL/min — ABNORMAL LOW (ref >90)

## 2012-01-14 LAB — DIFFERENTIAL
Basophils Relative: 1 % (ref 0–1)
Lymphocytes Relative: 32 % (ref 12–46)
Lymphs Abs: 2.4 10*3/uL (ref 0.7–4.0)
Monocytes Absolute: 0.5 10*3/uL (ref 0.1–1.0)
Monocytes Relative: 6 % (ref 3–12)
Neutro Abs: 4.4 10*3/uL (ref 1.7–7.7)
Neutrophils Relative %: 59 % (ref 43–77)

## 2012-01-14 LAB — CBC
HCT: 36.6 % (ref 36.0–46.0)
Hemoglobin: 11.8 g/dL — ABNORMAL LOW (ref 12.0–15.0)
MCHC: 32.2 g/dL (ref 30.0–36.0)
Platelets: 359 10*3/uL (ref 150–400)
RBC: 4.22 MIL/uL (ref 3.87–5.11)
RBC: 3.94 MIL/uL (ref 3.87–5.11)
WBC: 7.5 10*3/uL (ref 4.0–10.5)
WBC: 11.9 10*3/uL — ABNORMAL HIGH (ref 4.0–10.5)

## 2012-01-14 LAB — GLUCOSE, CAPILLARY
Glucose-Capillary: 68 mg/dL — ABNORMAL LOW (ref 70–99)
Glucose-Capillary: 119 mg/dL — ABNORMAL HIGH (ref 70–99)

## 2012-01-14 LAB — LIPASE, BLOOD: Lipase: 79 U/L — ABNORMAL HIGH (ref 11–59)

## 2012-01-14 MED ORDER — MORPHINE SULFATE 4 MG/ML IJ SOLN
4.0000 mg | Freq: Once | INTRAMUSCULAR | Status: AC
  Administered 2012-01-14: 4 mg via INTRAVENOUS
  Filled 2012-01-14: qty 1

## 2012-01-14 MED ORDER — PANTOPRAZOLE SODIUM 40 MG IV SOLR
40.0000 mg | Freq: Two times a day (BID) | INTRAVENOUS | Status: DC
  Administered 2012-01-14 – 2012-01-16 (×5): 40 mg via INTRAVENOUS
  Filled 2012-01-14 (×7): qty 40

## 2012-01-14 MED ORDER — SERTRALINE HCL 100 MG PO TABS
100.0000 mg | ORAL_TABLET | Freq: Every day | ORAL | Status: DC
  Administered 2012-01-14 – 2012-01-16 (×3): 100 mg via ORAL
  Filled 2012-01-14 (×3): qty 1

## 2012-01-14 MED ORDER — SODIUM CHLORIDE 0.9 % IV SOLN
INTRAVENOUS | Status: DC
  Administered 2012-01-14: 999 mL via INTRAVENOUS

## 2012-01-14 MED ORDER — HYDROCODONE-ACETAMINOPHEN 5-325 MG PO TABS
1.0000 | ORAL_TABLET | ORAL | Status: DC | PRN
  Administered 2012-01-15 (×2): 2 via ORAL
  Filled 2012-01-14 (×2): qty 2

## 2012-01-14 MED ORDER — SODIUM CHLORIDE 0.9 % IV SOLN
Freq: Once | INTRAVENOUS | Status: AC
  Administered 2012-01-14: 999 mL via INTRAVENOUS

## 2012-01-14 MED ORDER — ONDANSETRON HCL 4 MG/2ML IJ SOLN
4.0000 mg | Freq: Once | INTRAMUSCULAR | Status: AC
  Administered 2012-01-14: 4 mg via INTRAVENOUS
  Filled 2012-01-14: qty 2

## 2012-01-14 MED ORDER — MORPHINE SULFATE 2 MG/ML IJ SOLN: 1.0000 mg | INTRAMUSCULAR | Status: DC | PRN

## 2012-01-14 MED ORDER — KCL IN DEXTROSE-NACL 10-5-0.45 MEQ/L-%-% IV SOLN
INTRAVENOUS | Status: DC
  Administered 2012-01-14 – 2012-01-16 (×4): via INTRAVENOUS
  Filled 2012-01-14 (×7): qty 1000

## 2012-01-14 MED ORDER — ONDANSETRON HCL 4 MG PO TABS: 4.0000 mg | ORAL_TABLET | Freq: Four times a day (QID) | ORAL | Status: DC | PRN

## 2012-01-14 MED ORDER — HYDROMORPHONE HCL PF 1 MG/ML IJ SOLN
1.0000 mg | INTRAMUSCULAR | Status: DC | PRN
Start: 2012-01-14 — End: 2012-01-14

## 2012-01-14 MED ORDER — ASPIRIN EC 81 MG PO TBEC
81.0000 mg | DELAYED_RELEASE_TABLET | Freq: Every day | ORAL | Status: DC
  Administered 2012-01-14 – 2012-01-16 (×3): 81 mg via ORAL
  Filled 2012-01-14 (×3): qty 1

## 2012-01-14 MED ORDER — ENOXAPARIN SODIUM 40 MG/0.4ML ~~LOC~~ SOLN
40.0000 mg | SUBCUTANEOUS | Status: DC
  Administered 2012-01-14 – 2012-01-15 (×2): 40 mg via SUBCUTANEOUS
  Filled 2012-01-14 (×3): qty 0.4

## 2012-01-14 MED ORDER — ALPRAZOLAM 0.5 MG PO TABS
0.5000 mg | ORAL_TABLET | Freq: Four times a day (QID) | ORAL | Status: DC
  Administered 2012-01-14 – 2012-01-16 (×7): 0.5 mg via ORAL
  Filled 2012-01-14 (×7): qty 1

## 2012-01-14 MED ORDER — ACETAMINOPHEN 650 MG RE SUPP: 650.0000 mg | Freq: Four times a day (QID) | RECTAL | Status: DC | PRN

## 2012-01-14 MED ORDER — ACETAMINOPHEN 325 MG PO TABS: 650.0000 mg | ORAL_TABLET | Freq: Four times a day (QID) | ORAL | Status: DC | PRN

## 2012-01-14 MED ORDER — ONDANSETRON HCL 4 MG/2ML IJ SOLN: 4.0000 mg | Freq: Three times a day (TID) | INTRAMUSCULAR | Status: DC | PRN

## 2012-01-14 MED ORDER — LATANOPROST 0.005 % OP SOLN
1.0000 [drp] | Freq: Every day | OPHTHALMIC | Status: DC
  Administered 2012-01-14 – 2012-01-15 (×2): 1 [drp] via OPHTHALMIC
  Filled 2012-01-14: qty 2.5

## 2012-01-14 MED ORDER — GLUCOSE 40 % PO GEL
1.0000 | ORAL | Status: DC | PRN
  Administered 2012-01-14 – 2012-01-15 (×2): 37.5 g via ORAL
  Filled 2012-01-14 (×2): qty 1

## 2012-01-14 MED ORDER — ONDANSETRON HCL 4 MG/2ML IJ SOLN: 4.0000 mg | Freq: Four times a day (QID) | INTRAMUSCULAR | Status: DC | PRN

## 2012-01-14 MED ORDER — INSULIN ASPART 100 UNIT/ML ~~LOC~~ SOLN
0.0000 [IU] | Freq: Three times a day (TID) | SUBCUTANEOUS | Status: DC
  Administered 2012-01-15: 2 [IU] via SUBCUTANEOUS
  Administered 2012-01-16 (×2): 1 [IU] via SUBCUTANEOUS

## 2012-01-14 NOTE — ED Notes (Signed)
Pt presents with onset of epigastric pain that began again this morning.  Pt reports h/o pancreatits x 3-4 months with last surgery x 3 months ago.  Pt reports nausea, denies any vomiting.

## 2012-01-14 NOTE — ED Provider Notes (Signed)
History     CSN: [id number]  Arrival date & time 01/14/12  1048   First MD Initiated Contact with Patient 01/14/12 1053      Chief Complaint  Patient presents with  . Abdominal Pain    (Consider location/radiation/quality/duration/timing/severity/associated sxs/prior treatment) Patient is a 78 y.o. female presenting with abdominal pain. The history is provided by the patient.  Abdominal Pain The primary symptoms of the illness include abdominal pain, nausea and vomiting. The primary symptoms of the illness do not include fever or diarrhea. The current episode started 2 days ago. The onset of the illness was sudden. The problem has been gradually worsening.  The patient has not had a change in bowel habit. Symptoms associated with the illness do not include chills, urgency or frequency. Significant associated medical issues do not include GERD. Associated medical issues comments: pancreatitis.    Past Medical History  Diagnosis Date  . Pancreatitis     ~2008 and again in 11/2011 s/p ERCP  . Hypertension   . Diabetes mellitus   . Glaucoma   . High cholesterol   . GERD (gastroesophageal reflux disease)   . H/O hiatal hernia   . Atrial fibrillation     Coumadin    Past Surgical History  Procedure Date  . Appendectomy   . Abdominal hysterectomy   . Cholecystectomy   . Ercp 11/13/2011    Procedure: ENDOSCOPIC RETROGRADE CHOLANGIOPANCREATOGRAPHY (ERCP);  Surgeon: Theda Belfast, MD;  Location: [facility] ENDOSCOPY;  Service: Endoscopy;  Laterality: N/A;    Family History  Problem Relation Age of Onset  . Anesthesia problems Neg Hx   . Hypotension Neg Hx   . Malignant hyperthermia Neg Hx   . Pseudochol deficiency Neg Hx     History  Substance Use Topics  . Smoking status: Never Smoker   . Smokeless tobacco: Never Used  . Alcohol Use: No    OB History    Grav Para Term Preterm Abortions TAB SAB Ect Mult Living                  Review of Systems  Constitutional: Negative  for fever and chills.  Gastrointestinal: Positive for nausea, vomiting and abdominal pain. Negative for diarrhea.  Genitourinary: Negative for urgency and frequency.  All other systems reviewed and are negative.    Allergies  Lactose intolerance (gi); Penicillins; and Sulfa antibiotics  Home Medications   Current Outpatient Rx  Name Route Sig Dispense Refill  . ACETAMINOPHEN 500 MG PO TABS Oral Take 500 mg by mouth every 6 (six) hours as needed. for pain.    Marland Kitchen ALPRAZOLAM 1 MG PO TABS Oral Take 1 tablet (1 mg total) by mouth at bedtime as needed. 30 tablet 0  . ASPIRIN EC 81 MG PO TBEC Oral Take 1 tablet (81 mg total) by mouth daily.    Marland Kitchen DIGOXIN 0.125 MG PO TABS Oral Take 1 tablet (0.125 mg total) by mouth daily. 30 tablet 0  . DILTIAZEM HCL ER COATED BEADS 120 MG PO CP24 Oral Take 1 capsule (120 mg total) by mouth daily. 30 capsule 0  . FENOFIBRATE 160 MG PO TABS Oral Take 1 tablet (160 mg total) by mouth daily. 30 tablet 0  . HYOSCYAMINE SULFATE 0.125 MG SL SUBL Sublingual Place 1 tablet (0.125 mg total) under the tongue every 4 (four) hours as needed (for stomach cramping). 30 tablet 0  . XALATAN OP Ophthalmic Apply 1 drop to eye at bedtime.     Marland Kitchen  LISINOPRIL 5 MG PO TABS Oral Take 1 tablet (5 mg total) by mouth daily. 30 tablet 0  . METHYLPHENIDATE HCL 5 MG PO TABS Oral Take 5 mg by mouth 2 (two) times daily.    . CENTRUM SILVER PO Oral Take 1 tablet by mouth daily.    Marland Kitchen OMEPRAZOLE 20 MG PO CPDR Oral Take 20 mg by mouth 2 (two) times daily.     Marland Kitchen SIMVASTATIN 40 MG PO TABS Oral Take 40 mg by mouth every evening.      BP 112/58  Pulse 75  Temp(Src) 97.3 F (36.3 C) (Oral)  Resp 24  Ht 5' (1.524 m)  Wt 120 lb (54.432 kg)  BMI 23.44 kg/m2  SpO2 100%  Physical Exam  Nursing note and vitals reviewed. Constitutional: She is oriented to person, place, and time. She appears well-developed and well-nourished. No distress.  HENT:  Head: Normocephalic and atraumatic.  Neck: Normal  range of motion. Neck supple.  Cardiovascular: Normal rate and regular rhythm.  Exam reveals no gallop and no friction rub.   No murmur heard. Pulmonary/Chest: Effort normal and breath sounds normal. No respiratory distress. She has no wheezes.  Abdominal: Soft. Bowel sounds are normal. She exhibits no distension. There is tenderness.       There is ttp in the upper abdomen.  No rebound or guarding.  Musculoskeletal: Normal range of motion.  Neurological: She is alert and oriented to person, place, and time.  Skin: Skin is warm and dry. She is not diaphoretic.    ED Course  Procedures (including critical care time)  Labs Reviewed - No data to display Ct Abdomen Pelvis W Contrast  01/12/2012  *RADIOLOGY REPORT*  Clinical Data: Mid to right lower quadrant abdominal pain, nausea and vomiting, fatigue  CT ABDOMEN AND PELVIS WITH CONTRAST  Technique:  Multidetector CT imaging of the abdomen and pelvis was performed following the standard protocol during bolus administration of intravenous contrast.  Contrast: OMNIPAQUE IOHEXOL 300 MG/ML  SOLN  Comparison: CT abdomen pelvis of 12/15/2011  Findings: The lung bases are clear.  Coronary artery calcifications are present primarily in the distribution of the left anterior descending coronary artery and the left circumflex coronary artery. The liver enhances with no focal abnormality and no ductal dilatation is seen.  Surgical clips are present from prior cholecystectomy.  The small probably loculated fluid collections around the head of the pancreas on the prior study and adjacent to the uncinate process have further improved in the interval without definite fluid collection.  Some low attenuation remains particularly within the uncinate process.  The changes of pancreatitis involving the head and uncinate process have improved with better definition of the peripancreatic fat planes.  The pancreatic duct throughout the remainder of the body and tail the  pancreas is not dilated and the remainder of the peripancreatic fat planes are well preserved.  There is still some soft tissues between the head of the pancreas and the anterior medial right kidney which most likely is due to the prior pancreatitis.  No definite pseudocyst is evident.  The adrenal glands and spleen are unremarkable.  The stomach is not well distended.  The kidneys enhance and there is a cyst emanating from the periphery of the right mid upper kidney which appears stable.  No calculus or mass is seen.  The abdominal aorta is normal in caliber with moderate atheromatous change present.  No adenopathy is seen.  The urinary bladder is not well distended.  The uterus has previously been resected.  No adnexal lesion is seen.  There are scattered rectosigmoid colonic diverticula present.  The terminal ileum is unremarkable.  No bony abnormality is seen.  IMPRESSION:    Further improvement in changes of pancreatitis around the head of the pancreas and uncinate process.  No definite pseudocyst is currently seen.  Probable inflammatory change remains anterior to the right kidney and is stable.  Original Report Authenticated By: Juline Patch, M.D.     No diagnosis found.   Date: 01/14/2012  Rate: 93  Rhythm: atrial fibrillation  QRS Axis: normal  Intervals: normal  ST/T Wave abnormalities: nonspecific ST changes  Conduction Disutrbances:none  Narrative Interpretation:   Old EKG Reviewed: unchanged    MDM  I have spoken with Dr. Elnoria Howard who does not feel as though an emergent GI procedure is indicated.  He has asked for me to have the patient admitted to Medicine.  I spoke with [facility] who has agreed to admit her.          Geoffery Lyons, MD 01/14/12 1451

## 2012-01-14 NOTE — H&P (Addendum)
Hospital Admission Note Date: 01/14/2012  PCP: No primary provider on file.  Chief Complaint: Abdominal pian and nausea.   History of Present Illness: 78 year old with PMH significant for recurrent pancreatitis , Her last admission was on  12-11-2011 for pancreatitis and coagulopathy, presents to emergency department complaining of worsening abdominal pain. She relates abdominal pain on and off, few times per week, not that severe. Over last week she has been experiencing abdominal pain every day. The morning of admission her abdominal pain was worse for this reason she presents to hospital.  She describes pain as pressure, 9/10 , constant, associated with nausea and vomiting. She has not been able to eat over last week. She relates this pain is same pain when she has pancreatitis flare.  She denies dysuria, she is having her regular loose stool, denies chest pain or SOB. She had CT abdomen done on the 9 ordered by Dr Elnoria Howard for follow up.  She is not longer taking coumadin, she relates that her cardiologist is aware.     Allergies: Lactose intolerance (gi); Penicillins; and Sulfa antibiotics Past Medical History  Diagnosis Date  . Pancreatitis     ~2008 and again in 11/2011 s/p ERCP  . Hypertension   . Glaucoma   . High cholesterol   . GERD (gastroesophageal reflux disease)   . H/O hiatal hernia   . Atrial fibrillation     Coumadin  . Dizziness - light-headed   . Pneumonia 12/2011    "first time I know about"  . Shortness of breath on exertion   . Type II diabetes mellitus   . Depression   . Chronic lower back pain    Prior to Admission medications   Medication Sig Start Date End Date Taking? Authorizing Provider  acetaminophen (TYLENOL) 500 MG tablet Take 500 mg by mouth every 6 (six) hours as needed. for pain.   Yes Historical Provider, MD  ALPRAZolam Prudy Feeler) 1 MG tablet Take 0.5 mg by mouth 4 (four) times daily.   Yes Historical Provider, MD  aspirin EC 81 MG tablet Take 81 mg by  mouth daily.   Yes Historical Provider, MD  fenofibrate 160 MG tablet Take 160 mg by mouth daily.   Yes Historical Provider, MD  glimepiride (AMARYL) 4 MG tablet Take 4 mg by mouth 2 (two) times daily.   Yes Historical Provider, MD  HYDROcodone-acetaminophen (VICODIN) 5-500 MG per tablet Take 1 tablet by mouth every 6 (six) hours as needed. For pain   Yes Historical Provider, MD  hyoscyamine (LEVSIN, ANASPAZ) 0.125 MG tablet Take 0.125 mg by mouth every 4 (four) hours as needed. For abdominal pain   Yes Historical Provider, MD  Latanoprost (XALATAN OP) Place 1 drop into both eyes at bedtime.    Yes Historical Provider, MD  lisinopril (PRINIVIL,ZESTRIL) 5 MG tablet Take 5 mg by mouth every morning.   Yes Historical Provider, MD  methylphenidate (RITALIN) 5 MG tablet Take 5 mg by mouth 2 (two) times daily. At 8am and lunchtime   Yes Historical Provider, MD  Multiple Vitamins-Minerals (CENTRUM SILVER PO) Take 1 tablet by mouth daily.   Yes Historical Provider, MD  omeprazole (PRILOSEC) 20 MG capsule Take 20 mg by mouth 2 (two) times daily.    Yes Historical Provider, MD  sertraline (ZOLOFT) 100 MG tablet Take 100 mg by mouth daily.   Yes Historical Provider, MD  simvastatin (ZOCOR) 40 MG tablet Take 40 mg by mouth every evening.   Yes Historical Provider,  MD  cyproheptadine (PERIACTIN) 4 MG tablet Take 4 mg by mouth 3 (three) times daily as needed. For apetite    Historical Provider, MD   Past Surgical History  Procedure Date  . Ercp 11/13/2011    Procedure: ENDOSCOPIC RETROGRADE CHOLANGIOPANCREATOGRAPHY (ERCP);  Surgeon: Theda Belfast, MD;  Location: [facility] ENDOSCOPY;  Service: Endoscopy;  Laterality: N/A;  . Tonsillectomy and adenoidectomy ~ 1939  . Cholecystectomy 1990's  . Appendectomy ~ 1960  . Vaginal hysterectomy ~ 1960's  . Dilation and curettage of uterus   . Cataract extraction w/ intraocular lens  implant, bilateral 2000's   Family History  Problem Relation Age of Onset  . Anesthesia  problems Neg Hx   . Hypotension Neg Hx   . Malignant hyperthermia Neg Hx   . Pseudochol deficiency Neg Hx    History   Social History  . Marital Status: Married    Spouse Name: N/A       .     Occupational History  . Not on file.   Social History Main Topics  . Smoking status: Never Smoker   . Smokeless tobacco: Never Used  . Alcohol Use: No  . Drug Use: No  . Sexually Active: Not Currently     REVIEW OF SYSTEMS:  Constitutional:  No weight loss, night sweats, Fevers, chills, fatigue.  HEENT:  No headaches, Difficulty swallowing,Tooth/dental problems,Sore throat,  No sneezing, itching, ear ache, nasal congestion, post nasal drip,  Cardio-vascular:  No chest pain, Orthopnea, PND, swelling in lower extremities, anasarca, dizziness, palpitations   Resp:  No shortness of breath with exertion or at rest. No excess mucus, no productive cough, No non-productive cough, No coughing up of blood.No change in color of mucus.No wheezing.No chest wall deformity  Skin:  no rash or lesions.  GU:  no dysuria, change in color of urine, no urgency or frequency. No flank pain.  Musculoskeletal:  No joint pain or swelling. No decreased range of motion. No back pain.  Psych:  No change in mood or affect. No depression or anxiety. No memory loss.   Physical Exam: Filed Vitals:   01/14/12 1500 01/14/12 1515 01/14/12 1530 01/14/12 1545  BP: 116/56 126/56 129/46 96/42  Pulse: 80 89 94 88  Temp:      TempSrc:      Resp: 17 23 23 24   Height:      Weight:      SpO2: 100% 100% 100% 100%   No intake or output data in the 24 hours ending 01/14/12 1602 BP 96/42  Pulse 88  Temp(Src) 97.3 F (36.3 C) (Oral)  Resp 24  Ht 5' (1.524 m)  Wt 54.432 kg (120 lb)  BMI 23.44 kg/m2  SpO2 100%  General Appearance:    Alert, cooperative, no distress, appears stated age  Head:    Normocephalic, without obvious abnormality, atraumatic  Eyes:    PERRL, conjunctiva/corneas clear, EOM's intact,      Ears:    Normal TM's and external ear canals, both ears  Nose:   Nares normal, septum midline, mucosa normal, no drainage    or sinus tenderness  Throat:   Lips, mucosa, and tongue normal  Neck:   Supple, symmetrical, trachea midline, no adenopathy;    thyroid:  no enlargement/tenderness/nodules; no carotid   bruit or JVD     Lungs:     Clear to auscultation bilaterally, respirations unlabored  Chest Wall:    No tenderness or deformity   Heart:  Irregular rate and rhythm, S1 and S2 normal, no murmur, rub   or gallop     Abdomen:     Soft, mild -tender, bowel sounds active all four quadrants,    no masses, no organomegaly, no rigidity.  Genitalia:    Normal female without lesion, discharge or tenderness     Extremities:   Extremities normal, atraumatic, no cyanosis or edema  Pulses:   2+ and symmetric all extremities  Skin:   Skin color, texture, turgor normal, no rashes or lesions  Lymph nodes:   Cervical, supraclavicular, and axillary nodes normal  Neurologic:   CNII-XII intact, normal strength, sensation and reflexes    throughout   Lab results:  Basename 01/14/12 1122  NA 137  K 4.4  CL 101  CO2 19  GLUCOSE 158*  BUN 17  CREATININE 0.99  CALCIUM 9.8  MG --  PHOS --    Basename 01/14/12 1122  AST 24  ALT 10  ALKPHOS 68  BILITOT 0.6  PROT 7.2  ALBUMIN 3.6    Basename 01/14/12 1122  LIPASE 79*  AMYLASE --    Basename 01/14/12 1122  WBC 7.5  NEUTROABS 4.4  HGB 11.8*  HCT 36.6  MCV 86.7  PLT 362   Imaging results:  Ct Abdomen Pelvis W Contrast  01/12/2012  *RADIOLOGY REPORT*  Clinical Data: Mid to right lower quadrant abdominal pain, nausea and vomiting, fatigue  CT ABDOMEN AND PELVIS WITH CONTRAST  Technique:  Multidetector CT imaging of the abdomen and pelvis was performed following the standard protocol during bolus administration of intravenous contrast.  Contrast: OMNIPAQUE IOHEXOL 300 MG/ML  SOLN  Comparison: CT abdomen pelvis of 12/15/2011   Findings: The lung bases are clear.  Coronary artery calcifications are present primarily in the distribution of the left anterior descending coronary artery and the left circumflex coronary artery. The liver enhances with no focal abnormality and no ductal dilatation is seen.  Surgical clips are present from prior cholecystectomy.  The small probably loculated fluid collections around the head of the pancreas on the prior study and adjacent to the uncinate process have further improved in the interval without definite fluid collection.  Some low attenuation remains particularly within the uncinate process.  The changes of pancreatitis involving the head and uncinate process have improved with better definition of the peripancreatic fat planes.  The pancreatic duct throughout the remainder of the body and tail the pancreas is not dilated and the remainder of the peripancreatic fat planes are well preserved.  There is still some soft tissues between the head of the pancreas and the anterior medial right kidney which most likely is due to the prior pancreatitis.  No definite pseudocyst is evident.  The adrenal glands and spleen are unremarkable.  The stomach is not well distended.  The kidneys enhance and there is a cyst emanating from the periphery of the right mid upper kidney which appears stable.  No calculus or mass is seen.  The abdominal aorta is normal in caliber with moderate atheromatous change present.  No adenopathy is seen.  The urinary bladder is not well distended.  The uterus has previously been resected.  No adnexal lesion is seen.  There are scattered rectosigmoid colonic diverticula present.  The terminal ileum is unremarkable.  No bony abnormality is seen.  IMPRESSION:    Further improvement in changes of pancreatitis around the head of the pancreas and uncinate process.  No definite pseudocyst is currently seen.  Probable inflammatory  change remains anterior to the right kidney and is stable.   Original Report Authenticated By: Juline Patch, M.D.   Ct Abdomen Pelvis W Contrast  12/15/2011  *RADIOLOGY REPORT*  Clinical Data: Mid to right-sided abdominal pain  CT ABDOMEN AND PELVIS WITH CONTRAST  Technique:  Multidetector CT imaging of the abdomen and pelvis was performed following the standard protocol during bolus administration of intravenous contrast.  Contrast: OMNIPAQUE IOHEXOL 300 MG/ML IJ SOLN  Comparison: 11/19/2011  Findings: Mild scarring at the bases.  Coronary artery calcification.  Heart size within normal limits.  No pleural or pericardial effusion.  Mitral annular calcification.  Unremarkable liver, spleen, and adrenal glands.  Since the prior, there has been decreased stranding and fluid surrounding the pancreatic head and extending into the pancreaticoduodenal groove and anterior pararenal space on the right.  However, in the interval, several small loculated fluid collections with thick peripheral enhancement and/or necrotic adenopathy  have developed in this region in the interval.  Upper pole right renal cyst.  Otherwise symmetric renal enhancement.  Mild perinephric fat stranding is nonspecific.  No hydronephrosis or hydroureter.  Diverticulosis.  No bowel obstruction.  No free intraperitoneal air or fluid.  Thin-walled bladder.  Absent uterus.  No adnexal mass.  Fat containing inguinal hernias.  There is scattered atherosclerotic calcification of the aorta and its branches. No aneurysmal dilatation.  Patent portal vessels.  No acute osseous abnormality.  IMPRESSION: Adjacent to the head of the pancreas/uncinate process, pancreaticoduodenal groove, and anterior pararenal space on the right, the previously noted fluid has decreased in quantity over the past month. However, there are now several peripherally enhancing complex collections of indeterminate sterility versus necrotic lymphadenopathy. Consider GI consultation as this may be amenable to sampling via EUS.  Original  Report Authenticated By: Waneta Martins, M.D.   Assesment and Plan: 1-Acute on chronic Pancreatitis: Patient has history of gallstone pancreatitis, she  had cholecystectomy. Subsequently she had  ERCP and a small filling defect was noted, which was felt to be a stone. it was not able to be visualized with the sweep of the CBD. She subsequently developed a severe post-ERCP pancreatitis. Last admission she was treated with antibiotics for peripancreatic complex collection visualized on CT scan. She had CT scan on 4-09 that show Further improvement in changes of pancreatitis around the head of the pancreas and uncinate process. No definite pseudocyst is currently seen. Unclear why patient has exacerbation of pancreatitis. Will admit for IV fluids and pain control. NPO. IV morphine. Dr hung will see patient tomorrow.  No evidence of acute infection, no leukocytosis, no fever.   2-History of Afib: rate controlled. She is not on any medication for rate controlled. If needed we can add B Blockers. Coumadin was stopped. Per patient her cardiologist is aware.   3-Dm: SSI, patient is NPO. Hold oral medications.  4-Hypertension: hold lisinopril. SBP soft.    Britny Riel M.D. [facility] [phone] 01/14/2012, 4:02 PM

## 2012-01-14 NOTE — ED Notes (Signed)
Refer to triage note

## 2012-01-14 NOTE — Progress Notes (Signed)
CBG: 68  Treatment: 1 tube instant glucose  Symptoms: None  Follow-up CBG: Time:1825 CBG Result:91  Possible Reasons for Event: Inadequate meal intake  Comments/MD notified:Regalado    Raquelle Pietro, [place]

## 2012-01-15 LAB — GLUCOSE, CAPILLARY
Glucose-Capillary: 109 mg/dL — ABNORMAL HIGH (ref 70–99)
Glucose-Capillary: 57 mg/dL — ABNORMAL LOW (ref 70–99)
Glucose-Capillary: 85 mg/dL (ref 70–99)
Glucose-Capillary: 107 mg/dL — ABNORMAL HIGH (ref 70–99)

## 2012-01-15 LAB — CBC
HCT: 32 % — ABNORMAL LOW (ref 36.0–46.0)
Hemoglobin: 10.2 g/dL — ABNORMAL LOW (ref 12.0–15.0)
MCH: 28.3 pg (ref 26.0–34.0)
MCHC: 31.9 g/dL (ref 30.0–36.0)
Platelets: 333 10*3/uL (ref 150–400)
RBC: 3.6 MIL/uL — ABNORMAL LOW (ref 3.87–5.11)

## 2012-01-15 LAB — COMPREHENSIVE METABOLIC PANEL
ALT: 8 U/L (ref 0–35)
AST: 22 U/L (ref 0–37)
Calcium: 8.7 mg/dL (ref 8.4–10.5)
GFR calc Af Amer: 64 mL/min — ABNORMAL LOW (ref >90)
Sodium: 137 mEq/L (ref 135–145)
Total Protein: 6.4 g/dL (ref 6.0–8.3)

## 2012-01-15 MED ORDER — HYDROMORPHONE HCL PF 1 MG/ML IJ SOLN: 0.5000 mg | INTRAMUSCULAR | Status: DC | PRN

## 2012-01-15 MED ORDER — POLYETHYLENE GLYCOL 3350 17 G PO PACK
17.0000 g | PACK | Freq: Every day | ORAL | Status: DC
  Administered 2012-01-15 – 2012-01-16 (×2): 17 g via ORAL
  Filled 2012-01-15 (×3): qty 1

## 2012-01-15 MED ORDER — HYDROCODONE-ACETAMINOPHEN 5-325 MG PO TABS
2.0000 | ORAL_TABLET | ORAL | Status: DC | PRN
  Administered 2012-01-15: 2 via ORAL
  Filled 2012-01-15: qty 2

## 2012-01-15 NOTE — Progress Notes (Signed)
Patient ID: Bita Cartwright, female   DOB: [birth year], 78 y.o.   MRN: [id number] Patient ID: Jimmye Wisnieski, female   DOB: [birth year], 78 y.o.   MRN: [id number] PATIENT DETAILS Name: [name] [name] Age: 78 y.o. Sex: female Date of Birth: 1930/02/07 Admit Date: 01/14/2012 PCP:No primary provider on file.  Subjective: "They are trying to starve me, I'm hungry"  Objective: Weight change:   Intake/Output Summary (Last 24 hours) at 01/15/12 1622 Last data filed at 01/15/12 0921  Gross per 24 hour  Intake      0 ml  Output    325 ml  Net   -325 ml   Blood pressure 115/76, pulse 68, temperature 97.6 F (36.4 C), temperature source Oral, resp. rate 17, height 5' (1.524 m), weight 52.6 kg (115 lb 15.4 oz), SpO2 100.00%. Filed Vitals:   01/14/12 1733 01/14/12 2210 01/15/12 0526 01/15/12 1417  BP: 99/60 99/61 103/66 115/76  Pulse: 65 97 97 68  Temp: 98.1 F (36.7 C) 97.9 F (36.6 C) 98.6 F (37 C) 97.6 F (36.4 C)  TempSrc: Oral Oral Oral Oral  Resp: 18 16 14 17   Height:      Weight:   52.6 kg (115 lb 15.4 oz)   SpO2: 98% 96% 96% 100%    Physical Exam: General: No acute distress, appears elderly and frail. Lungs: Clear to auscultation bilaterally without wheezes or crackles.  Chest asymmetrical right side elevated, left side (pectoral region) lower. Cardiovascular: Irregular rate and irregular rhythm without murmur gallop or rub normal S1 and S2 Abdomen: Nontender, nondistended, soft, bowel sounds positive, no rebound, no ascites, no appreciable mass Extremities: No significant cyanosis, clubbing, or edema bilateral lower extremities  Basic Metabolic Panel:  Lab 01/15/12 7829 01/14/12 1813 01/14/12 1122  NA 137 -- 137  K 4.4 -- 4.4  CL 105 -- 101  CO2 23 -- 19  GLUCOSE 176* -- 158*  BUN 12 -- 17  CREATININE 0.96 0.99 --  CALCIUM 8.7 -- 9.8  MG -- -- --  PHOS -- -- --   Liver Function Tests:  Lab 01/15/12 0620 01/14/12 1122  AST 22 24  ALT 8 10  ALKPHOS 58  68  BILITOT 0.4 0.6  PROT 6.4 7.2  ALBUMIN 3.1* 3.6    Lab 01/14/12 1122  LIPASE 79*  AMYLASE --   No results found for this basename: AMMONIA:2 in the last 168 hours CBC:  Lab 01/15/12 0620 01/14/12 1813 01/14/12 1122  WBC 7.5 11.9* --  NEUTROABS -- -- 4.4  HGB 10.2* 11.1* --  HCT 32.0* 34.0* --  MCV 88.9 86.3 --  PLT 333 359 --   CBG:  Lab 01/15/12 1158 01/15/12 0755 01/14/12 2148 01/14/12 1825 01/14/12 1726  GLUCAP 109* 171* 119* 91 68*    Studies/Results: Scheduled Meds:    . sodium chloride   Intravenous Once  . ALPRAZolam  0.5 mg Oral QID  . aspirin EC  81 mg Oral Daily  . enoxaparin  40 mg Subcutaneous Q24H  . insulin aspart  0-9 Units Subcutaneous TID WC  . latanoprost  1 drop Both Eyes Daily  . pantoprazole (PROTONIX) IV  40 mg Intravenous Q12H  . polyethylene glycol  17 g Oral Daily  . sertraline  100 mg Oral Daily  . DISCONTD: sodium chloride   Intravenous STAT   Continuous Infusions:    . dextrose 5 % and 0.45 % NaCl with KCl 10 mEq/L 75 mL/hr at 01/15/12 1222   PRN Meds:.acetaminophen, acetaminophen, dextrose,  HYDROcodone-acetaminophen, HYDROmorphone (DILAUDID) injection, ondansetron (ZOFRAN) IV, ondansetron, DISCONTD:  HYDROmorphone (DILAUDID) injection, DISCONTD: morphine, DISCONTD: ondansetron (ZOFRAN) IV  Anti-infectives:  Anti-infectives    None      Assessment/Plan: Active Problems:  Pancreatitis  DM (diabetes mellitus)  HTN (hypertension)  Atrial fibrillation  Assesment and Plan:   1-Acute on chronic Pancreatitis: Patient has history of gallstone pancreatitis, she had cholecystectomy. Subsequently she had ERCP and a small filling defect was noted, which was felt to be a stone. it was not able to be visualized with the sweep of the CBD on ERCP. She subsequently developed a severe post-ERCP pancreatitis (2012). Last admission she was treated with antibiotics for peripancreatic complex collection visualized on CT scan. She had CT scan  on 4-09 that show Further improvement in changes of pancreatitis around the head of the pancreas and uncinate process. No definite pseudocyst is currently seen. Unclear why patient has exacerbation of pancreatitis.   Dr. Elnoria Howard was consulted and feels similarly.  He recommends pain control and possible evaluation and a tertiary care center ([facility]/ [place]).  The patient is currently tolerating on sips and chips and requiring low dose dilaudid to relieve her pain.   Patient also had pancreatitis in 2008.  Consider completely D/C lisinopril as it can cause recurrence of acute on chronic pancreatitis.  Will hopefully  improve with a day or so of bowel rest as a chronic pancreatitis patient would.  Will ask for nutrition counseling/diet education.   2-History of Afib: rate controlled. She is not on any medication for rate controlled. If needed we can add B Blockers. Coumadin was stopped. Per patient her cardiologist is aware.   3-Dm: SSI, patient is NPO. Hold oral medications.   4-Hypertension: hold lisinopril. SBP soft. IV fluids with dextrose at 75 ml/hr as patient is not able to tolerate eating.  DVT Prophylaxis:  lovenox.   LOS: 1 day   [facility] 01/15/2012, 4:22 PM [phone]  Attending  I have seen and examined this patient, I agree with the assessment and plan, will continue with conservative/Supportive care, will try to advance diet, and then subsequently refer to [facility] as outpatient.  Dr Windell Norfolk

## 2012-01-15 NOTE — Progress Notes (Signed)
Patient ID: [name] [name], female   DOB: [birth year], 78 y.o.   MRN: [id number] PATIENT DETAILS Name: [name] [name] Age: 78 y.o. Sex: female Date of Birth: 05/17/1930 Admit Date: 01/14/2012 PCP:No primary provider on file.  Subjective: "They are trying to starve me, I'm hungry"  Objective: Weight change:   Intake/Output Summary (Last 24 hours) at 01/15/12 1509 Last data filed at 01/15/12 0921  Gross per 24 hour  Intake      0 ml  Output    325 ml  Net   -325 ml   Blood pressure 115/76, pulse 68, temperature 97.6 F (36.4 C), temperature source Oral, resp. rate 17, height 5' (1.524 m), weight 52.6 kg (115 lb 15.4 oz), SpO2 100.00%. Filed Vitals:   01/14/12 1733 01/14/12 2210 01/15/12 0526 01/15/12 1417  BP: 99/60 99/61 103/66 115/76  Pulse: 65 97 97 68  Temp: 98.1 F (36.7 C) 97.9 F (36.6 C) 98.6 F (37 C) 97.6 F (36.4 C)  TempSrc: Oral Oral Oral Oral  Resp: 18 16 14 17   Height:      Weight:   52.6 kg (115 lb 15.4 oz)   SpO2: 98% 96% 96% 100%    Physical Exam: General: No acute distress, appears elderly and frail. Lungs: Clear to auscultation bilaterally without wheezes or crackles.  Chest asymmetrical right side elevated, left side (pectoral region) lower. Cardiovascular: Irregular rate and irregular rhythm without murmur gallop or rub normal S1 and S2 Abdomen: Nontender, nondistended, soft, bowel sounds positive, no rebound, no ascites, no appreciable mass Extremities: No significant cyanosis, clubbing, or edema bilateral lower extremities  Basic Metabolic Panel:  Lab 01/15/12 1478 01/14/12 1813 01/14/12 1122  NA 137 -- 137  K 4.4 -- 4.4  CL 105 -- 101  CO2 23 -- 19  GLUCOSE 176* -- 158*  BUN 12 -- 17  CREATININE 0.96 0.99 --  CALCIUM 8.7 -- 9.8  MG -- -- --  PHOS -- -- --   Liver Function Tests:  Lab 01/15/12 0620 01/14/12 1122  AST 22 24  ALT 8 10  ALKPHOS 58 68  BILITOT 0.4 0.6  PROT 6.4 7.2  ALBUMIN 3.1* 3.6    Lab 01/14/12 1122    LIPASE 79*  AMYLASE --   No results found for this basename: AMMONIA:2 in the last 168 hours CBC:  Lab 01/15/12 0620 01/14/12 1813 01/14/12 1122  WBC 7.5 11.9* --  NEUTROABS -- -- 4.4  HGB 10.2* 11.1* --  HCT 32.0* 34.0* --  MCV 88.9 86.3 --  PLT 333 359 --   CBG:  Lab 01/15/12 1158 01/15/12 0755 01/14/12 2148 01/14/12 1825 01/14/12 1726  GLUCAP 109* 171* 119* 91 68*    Studies/Results: Scheduled Meds:   . sodium chloride   Intravenous Once  . ALPRAZolam  0.5 mg Oral QID  . aspirin EC  81 mg Oral Daily  . enoxaparin  40 mg Subcutaneous Q24H  . insulin aspart  0-9 Units Subcutaneous TID WC  . latanoprost  1 drop Both Eyes Daily  . pantoprazole (PROTONIX) IV  40 mg Intravenous Q12H  . sertraline  100 mg Oral Daily  . DISCONTD: sodium chloride   Intravenous STAT   Continuous Infusions:   . dextrose 5 % and 0.45 % NaCl with KCl 10 mEq/L 75 mL/hr at 01/15/12 1222   PRN Meds:.acetaminophen, acetaminophen, dextrose, HYDROcodone-acetaminophen, HYDROmorphone (DILAUDID) injection, ondansetron (ZOFRAN) IV, ondansetron, DISCONTD:  HYDROmorphone (DILAUDID) injection, DISCONTD: morphine, DISCONTD: ondansetron (ZOFRAN) IV  Anti-infectives:  Anti-infectives  None      Assessment/Plan: Active Problems:  Pancreatitis  DM (diabetes mellitus)  HTN (hypertension)  Atrial fibrillation  Assesment and Plan:   1-Acute on chronic Pancreatitis: Patient has history of gallstone pancreatitis, she had cholecystectomy. Subsequently she had ERCP and a small filling defect was noted, which was felt to be a stone. it was not able to be visualized with the sweep of the CBD. She subsequently developed a severe post-ERCP pancreatitis (2012). Last admission she was treated with antibiotics for peripancreatic complex collection visualized on CT scan. She had CT scan on 4-09 that show Further improvement in changes of pancreatitis around the head of the pancreas and uncinate process. No definite  pseudocyst is currently seen. Unclear why patient has exacerbation of pancreatitis.   Dr. Elnoria Howard was consulted and feels similarly.  He recommends pain control and possible evaluation and a tertiary care center ([facility]/ [place]).  The patient is currently tolerating on sips and chips and requiring low dose dilaudid to relieve her pain.   Patient also had pancreatitis in 2008.  Consider completely D/C lisinopril as it can cause recurrence of acute on chronic pancreatitis.  Will hopefully  improve with a day or so of bowel rest as a chronic pancreatitis patient would.  Will ask for nutrition counseling/diet education.   2-History of Afib: rate controlled. She is not on any medication for rate controlled. If needed we can add B Blockers. Coumadin was stopped. Per patient her cardiologist is aware.   3-Dm: SSI, patient is NPO. Hold oral medications.   4-Hypertension: hold lisinopril. SBP soft. IV fluids with dextrose at 75 ml/hr as patient is not able to tolerate eating.  DVT Prophylaxis:  lovenox.   LOS: 1 day    Stephani Police 01/15/2012, 3:09 PM [phone]

## 2012-01-15 NOTE — Consult Note (Signed)
Reason for Consult: ABM pain Referring Physician: [facility]  [name] [name] HPI: This is a 78 year old female who is well-known to me.  I last saw her in the office a couple of weeks ago and she appeared to be stable, but very lethargic and weak.  She has failure to thrive and she continues to have RUQ abdominal pain.  In the office she did not have any pain.  The recent repeat CT scan revealed a marked improvement in her post-ERCP pancreatitis.  The pseudocysts were resolving.  She underwent the ERCP because her lipase was elevated in the 600 range and the MRCP was suggestive of an impacted stone in the distal CBD.  I was never able to confirm a distal CBD stone and her procedure was difficult to perform.  Currently, she reports having similar abdominal pain prior to the ERCP without the nausea and vomiting.  Current lipase levels are low and there is no elevation in her WBC.  Past Medical History  Diagnosis Date  . Pancreatitis     ~2008 and again in 11/2011 s/p ERCP  . Hypertension   . Glaucoma   . High cholesterol   . GERD (gastroesophageal reflux disease)   . H/O hiatal hernia   . Atrial fibrillation     Coumadin  . Dizziness - light-headed   . Pneumonia 12/2011    "first time I know about"  . Shortness of breath on exertion   . Type II diabetes mellitus   . Depression   . Chronic lower back pain     Past Surgical History  Procedure Date  . Ercp 11/13/2011    Procedure: ENDOSCOPIC RETROGRADE CHOLANGIOPANCREATOGRAPHY (ERCP);  Surgeon: Theda Belfast, MD;  Location: [facility] ENDOSCOPY;  Service: Endoscopy;  Laterality: N/A;  . Tonsillectomy and adenoidectomy ~ 1939  . Cholecystectomy 1990's  . Appendectomy ~ 1960  . Vaginal hysterectomy ~ 1960's  . Dilation and curettage of uterus   . Cataract extraction w/ intraocular lens  implant, bilateral 2000's    Family History  Problem Relation Age of Onset  . Anesthesia problems Neg Hx   . Hypotension Neg Hx   . Malignant  hyperthermia Neg Hx   . Pseudochol deficiency Neg Hx     Social History:  reports that she has never smoked. She has never used smokeless tobacco. She reports that she does not drink alcohol or use illicit drugs.  Allergies:  Allergies  Allergen Reactions  . Lactose Intolerance (Gi) Nausea And Vomiting    severe stomach pain  . Penicillins Hives and Itching    "haven't used any since 1970's"  . Sulfa Antibiotics Hives and Itching    "haven't used any since 1970's"    Medications:  Scheduled:   . sodium chloride   Intravenous Once  . ALPRAZolam  0.5 mg Oral QID  . aspirin EC  81 mg Oral Daily  . enoxaparin  40 mg Subcutaneous Q24H  . insulin aspart  0-9 Units Subcutaneous TID WC  . latanoprost  1 drop Both Eyes Daily  .  morphine injection  4 mg Intravenous Once  . ondansetron (ZOFRAN) IV  4 mg Intravenous Once  . pantoprazole (PROTONIX) IV  40 mg Intravenous Q12H  . sertraline  100 mg Oral Daily  . DISCONTD: sodium chloride   Intravenous STAT   Continuous:   . dextrose 5 % and 0.45 % NaCl with KCl 10 mEq/L 75 mL/hr at 01/15/12 1222    Results for orders placed  during the hospital encounter of 01/14/12 (from the past 24 hour(s))  GLUCOSE, CAPILLARY     Status: Abnormal   Collection Time   01/14/12  5:26 PM      Component Value Range   Glucose-Capillary 68 (*) 70 - 99 (mg/dL)  CBC     Status: Abnormal   Collection Time   01/14/12  6:13 PM      Component Value Range   WBC 11.9 (*) 4.0 - 10.5 (K/uL)   RBC 3.94  3.87 - 5.11 (MIL/uL)   Hemoglobin 11.1 (*) 12.0 - 15.0 (g/dL)   HCT 40.9 (*) 81.1 - 46.0 (%)   MCV 86.3  78.0 - 100.0 (fL)   MCH 28.2  26.0 - 34.0 (pg)   MCHC 32.6  30.0 - 36.0 (g/dL)   RDW 91.4 (*) 78.2 - 15.5 (%)   Platelets 359  150 - 400 (K/uL)  CREATININE, SERUM     Status: Abnormal   Collection Time   01/14/12  6:13 PM      Component Value Range   Creatinine, Ser 0.99  0.50 - 1.10 (mg/dL)   GFR calc non Af Amer 53 (*) >90 (mL/min)   GFR calc Af Amer  61 (*) >90 (mL/min)  GLUCOSE, CAPILLARY     Status: Normal   Collection Time   01/14/12  6:25 PM      Component Value Range   Glucose-Capillary 91  70 - 99 (mg/dL)  GLUCOSE, CAPILLARY     Status: Abnormal   Collection Time   01/14/12  9:48 PM      Component Value Range   Glucose-Capillary 119 (*) 70 - 99 (mg/dL)   Comment 1 Documented in Chart     Comment 2 Notify RN    COMPREHENSIVE METABOLIC PANEL     Status: Abnormal   Collection Time   01/15/12  6:20 AM      Component Value Range   Sodium 137  135 - 145 (mEq/L)   Potassium 4.4  3.5 - 5.1 (mEq/L)   Chloride 105  96 - 112 (mEq/L)   CO2 23  19 - 32 (mEq/L)   Glucose, Bld 176 (*) 70 - 99 (mg/dL)   BUN 12  6 - 23 (mg/dL)   Creatinine, Ser 9.56  0.50 - 1.10 (mg/dL)   Calcium 8.7  8.4 - 21.3 (mg/dL)   Total Protein 6.4  6.0 - 8.3 (g/dL)   Albumin 3.1 (*) 3.5 - 5.2 (g/dL)   AST 22  0 - 37 (U/L)   ALT 8  0 - 35 (U/L)   Alkaline Phosphatase 58  39 - 117 (U/L)   Total Bilirubin 0.4  0.3 - 1.2 (mg/dL)   GFR calc non Af Amer 55 (*) >90 (mL/min)   GFR calc Af Amer 64 (*) >90 (mL/min)  CBC     Status: Abnormal   Collection Time   01/15/12  6:20 AM      Component Value Range   WBC 7.5  4.0 - 10.5 (K/uL)   RBC 3.60 (*) 3.87 - 5.11 (MIL/uL)   Hemoglobin 10.2 (*) 12.0 - 15.0 (g/dL)   HCT 08.6 (*) 57.8 - 46.0 (%)   MCV 88.9  78.0 - 100.0 (fL)   MCH 28.3  26.0 - 34.0 (pg)   MCHC 31.9  30.0 - 36.0 (g/dL)   RDW 46.9 (*) 62.9 - 15.5 (%)   Platelets 333  150 - 400 (K/uL)  GLUCOSE, CAPILLARY     Status: Abnormal  Collection Time   01/15/12  7:55 AM      Component Value Range   Glucose-Capillary 171 (*) 70 - 99 (mg/dL)  GLUCOSE, CAPILLARY     Status: Abnormal   Collection Time   01/15/12 11:58 AM      Component Value Range   Glucose-Capillary 109 (*) 70 - 99 (mg/dL)     No results found.  ROS:  As stated above in the HPI otherwise negative.  Blood pressure 103/66, pulse 97, temperature 98.6 F (37 C), temperature source Oral, resp.  rate 14, height 5' (1.524 m), weight 52.6 kg (115 lb 15.4 oz), SpO2 96.00%.    PE: Gen: NAD, Alert and Oriented HEENT:  [place]/AT, EOMI Neck: Supple, no LAD Lungs: CTA Bilaterally CV: RRR without M/G/R ABM: Soft, ? Tenderness in the RUQ, +BS Ext: No C/C/E  Assessment/Plan: 1) RUQ pain. 2) Failure to thrive. 3) History of pancreatitis and post-ERCP pancreatitis.   At this point she is too far out to have persistent issues with post-ERCP pancreatitis.  It appears that she has the same type of pain prior to her ERCP.  The CT scan reveals a significant interval improvement and her biliary bloodwork is normal.  Unfortunately I do not have any new thought to offer aside from pain control.  If needed a second opinion from a tertiary care center may be in order if she cannot be controlled here in the hospital.  Plan: 1) Pain control.  [name] [name] D 01/15/2012, 2:01 PM

## 2012-01-15 NOTE — Progress Notes (Signed)
INITIAL ADULT NUTRITION ASSESSMENT Date: 01/15/2012   Time: 11:34 AM Reason for Assessment: Screened at nutrition risk for unintentional weight loss of > 10 lb over 1 month.   ASSESSMENT: Female 78 y.o.  Dx: Abdominal pain and nausea  Hx:  Past Medical History  Diagnosis Date  . Pancreatitis     ~2008 and again in 11/2011 s/p ERCP  . Hypertension   . Glaucoma   . High cholesterol   . GERD (gastroesophageal reflux disease)   . H/O hiatal hernia   . Atrial fibrillation     Coumadin  . Dizziness - light-headed   . Pneumonia 12/2011    "first time I know about"  . Shortness of breath on exertion   . Type II diabetes mellitus   . Depression   . Chronic lower back pain     Related Meds:  Scheduled Meds:   . sodium chloride   Intravenous Once  . ALPRAZolam  0.5 mg Oral QID  . aspirin EC  81 mg Oral Daily  . enoxaparin  40 mg Subcutaneous Q24H  . insulin aspart  0-9 Units Subcutaneous TID WC  . latanoprost  1 drop Both Eyes Daily  .  morphine injection  4 mg Intravenous Once  .  morphine injection  4 mg Intravenous Once  . ondansetron (ZOFRAN) IV  4 mg Intravenous Once  . ondansetron (ZOFRAN) IV  4 mg Intravenous Once  . pantoprazole (PROTONIX) IV  40 mg Intravenous Q12H  . sertraline  100 mg Oral Daily  . DISCONTD: sodium chloride   Intravenous STAT   Continuous Infusions:   . dextrose 5 % and 0.45 % NaCl with KCl 10 mEq/L 100 mL/hr at 01/15/12 0615   PRN Meds:.acetaminophen, acetaminophen, dextrose, HYDROcodone-acetaminophen, morphine, ondansetron (ZOFRAN) IV, ondansetron, DISCONTD:  HYDROmorphone (DILAUDID) injection, DISCONTD: ondansetron (ZOFRAN) IV   Ht: 5' (152.4 cm)  Wt: 115 lb 15.4 oz (52.6 kg)  Ideal Wt: 45.5 kg  % Ideal Wt: 115%  Usual Wt: 130 lb. A month ago in March, per patient.  % Usual Wt: 88.46% * Unintentional weight loss of 15 lb., 11.5% weight loss from baseline over 1 month.   BMI: 22.45 kg/m^2   Food/Nutrition Related Hx: Patient is NPO  at this time. She reported that her appetite has been very poor. Per husband patient was to start appetite stimulant yesterday. He also reported patient has been consuming 33% of her meals over the last month. Patient denies any problems chewing or swallowing. She reported to drink 2 Vanilla Glucerna nutrition supplements at home daily.   Labs:  CMP     Component Value Date/Time   NA 137 01/15/2012 0620   K 4.4 01/15/2012 0620   CL 105 01/15/2012 0620   CO2 23 01/15/2012 0620   GLUCOSE 176* 01/15/2012 0620   BUN 12 01/15/2012 0620   CREATININE 0.96 01/15/2012 0620   CALCIUM 8.7 01/15/2012 0620   PROT 6.4 01/15/2012 0620   ALBUMIN 3.1* 01/15/2012 0620   AST 22 01/15/2012 0620   ALT 8 01/15/2012 0620   ALKPHOS 58 01/15/2012 0620   BILITOT 0.4 01/15/2012 0620   GFRNONAA 55* 01/15/2012 0620   GFRAA 64* 01/15/2012 0620    Intake/Output Summary (Last 24 hours) at 01/15/12 1141 Last data filed at 01/15/12 1610  Gross per 24 hour  Intake      0 ml  Output    325 ml  Net   -325 ml      Diet Order: NPO  Supplements/Tube Feeding: none at this time  IVF:    dextrose 5 % and 0.45 % NaCl with KCl 10 mEq/L Last Rate: 100 mL/hr at 01/15/12 0615    Estimated Nutritional Needs:   Kcal: 1100-1400 Protein: 62.7-78.4 grams Fluid: 1 ml per kcal   NUTRITION DIAGNOSIS: -Inadequate oral intake (NI-2.1).  Status: Ongoing  RELATED TO: inability to eat   AS EVIDENCE BY: NPO status  MONITORING/EVALUATION(Goals): Weight trends, diet advancement, labs, I/O's, PO intake 1. Advance diet as medically able. 2. Once diet is advanced PO intake > 75% at meals  3. Minimize weight loss  EDUCATION NEEDS: -No education needs identified at this time  INTERVENTION: 1. Once diet is advanced, recommend ordering patient Vanilla Glucerna supplement BID. Provides 440 kcal and 20 grams of protein.  2. RD to follow for nutrition plan of care.   Dietitian [phone]  DOCUMENTATION CODES Per approved criteria    -Severe malnutrition in the context of chronic illness  *Patient meets criteria for severe malnutrition in the context of chronic illness due to consuming less than or equal to 75% of estimated energy requirements for > or equal to 1 month and a weight loss of > 5% from base line over 1 month.   Iven Finn [facility] 01/15/2012, 11:34 AM

## 2012-01-15 NOTE — Progress Notes (Signed)
CBG:57  Treatment: 15 GM gel  Symptoms: None  Follow-up CBG: Time:tba CBG Result:tba  Possible Reasons for Event: Inadequate meal intake and Other: No IV access for a few hours today.  Comments/MD notified:Ghimire    [name] [name], [name] [name]

## 2012-01-15 NOTE — Progress Notes (Signed)
Utilization review completed.  

## 2012-01-16 DIAGNOSIS — D649 Anemia, unspecified: Secondary | ICD-10-CM

## 2012-01-16 DIAGNOSIS — K859 Acute pancreatitis without necrosis or infection, unspecified: Principal | ICD-10-CM

## 2012-01-16 DIAGNOSIS — R109 Unspecified abdominal pain: Secondary | ICD-10-CM

## 2012-01-16 LAB — BASIC METABOLIC PANEL
CO2: 23 mEq/L (ref 19–32)
Calcium: 8.9 mg/dL (ref 8.4–10.5)
Chloride: 106 mEq/L (ref 96–112)
Glucose, Bld: 117 mg/dL — ABNORMAL HIGH (ref 70–99)
Potassium: 4.4 mEq/L (ref 3.5–5.1)
Sodium: 138 mEq/L (ref 135–145)

## 2012-01-16 LAB — GLUCOSE, CAPILLARY
Glucose-Capillary: 145 mg/dL — ABNORMAL HIGH (ref 70–99)
Glucose-Capillary: 144 mg/dL — ABNORMAL HIGH (ref 70–99)

## 2012-01-16 LAB — LIPASE, BLOOD: Lipase: 66 U/L — ABNORMAL HIGH (ref 11–59)

## 2012-01-16 MED ORDER — HYDROCODONE-ACETAMINOPHEN 5-500 MG PO TABS: 1.0000 | ORAL_TABLET | Freq: Four times a day (QID) | ORAL | Status: DC | PRN

## 2012-01-16 MED ORDER — GLIMEPIRIDE 4 MG PO TABS: 4.0000 mg | ORAL_TABLET | Freq: Every day | ORAL | Status: DC

## 2012-01-16 MED ORDER — POLYETHYLENE GLYCOL 3350 17 G PO PACK: 17.0000 g | PACK | Freq: Every day | ORAL | Status: AC

## 2012-01-16 MED ORDER — LATANOPROST 0.005 % OP SOLN: 1.0000 [drp] | Freq: Every day | OPHTHALMIC | Status: DC

## 2012-01-16 MED ORDER — PANTOPRAZOLE SODIUM 40 MG PO TBEC: 40.0000 mg | DELAYED_RELEASE_TABLET | Freq: Two times a day (BID) | ORAL | Status: DC

## 2012-01-16 MED ORDER — HYDROCODONE-ACETAMINOPHEN 5-325 MG PO TABS: 1.0000 | ORAL_TABLET | Freq: Four times a day (QID) | ORAL | Status: DC | PRN

## 2012-01-16 NOTE — Progress Notes (Signed)
I have seen and examined the patient, I agree with the assessment and plan. 

## 2012-01-16 NOTE — Discharge Summary (Signed)
PATIENT DETAILS Name: [name] [name] Age: 78 y.o. Sex: female Date of Birth: 12/23/1929 MRN: [id number]. Admit Date: 01/14/2012 Admitting Physician: Alba Cory, MD ION:[id number],XLKGMW A, MD, MD  PRIMARY DISCHARGE DIAGNOSIS:  Active Problems:  Pancreatitis  DM (diabetes mellitus)  HTN (hypertension)  Atrial fibrillation      PAST MEDICAL HISTORY: Past Medical History  Diagnosis Date  . Pancreatitis     ~2008 and again in 11/2011 s/p ERCP  . Hypertension   . Glaucoma   . High cholesterol   . GERD (gastroesophageal reflux disease)   . H/O hiatal hernia   . Atrial fibrillation     Coumadin  . Dizziness - light-headed   . Pneumonia 12/2011    "first time I know about"  . Shortness of breath on exertion   . Type II diabetes mellitus   . Depression   . Chronic lower back pain     DISCHARGE MEDICATIONS: Medication List  As of 01/16/2012  3:40 PM   STOP taking these medications         HYDROcodone-acetaminophen 5-500 MG per tablet         TAKE these medications         acetaminophen 500 MG tablet   Commonly known as: TYLENOL   Take 500 mg by mouth every 6 (six) hours as needed. for pain.      ALPRAZolam 1 MG tablet   Commonly known as: XANAX   Take 0.5 mg by mouth 4 (four) times daily.      aspirin EC 81 MG tablet   Take 81 mg by mouth daily.      CENTRUM SILVER PO   Take 1 tablet by mouth daily.      cyproheptadine 4 MG tablet   Commonly known as: PERIACTIN   Take 4 mg by mouth 3 (three) times daily as needed. For apetite      fenofibrate 160 MG tablet   Take 160 mg by mouth daily.      glimepiride 4 MG tablet   Commonly known as: AMARYL   Take 1 tablet (4 mg total) by mouth daily.      HYDROcodone-acetaminophen 5-325 MG per tablet   Commonly known as: NORCO   Take 1-2 tablets by mouth every 6 (six) hours as needed.      hyoscyamine 0.125 MG tablet   Commonly known as: LEVSIN, ANASPAZ   Take 0.125 mg by mouth every 4 (four) hours as needed.  For abdominal pain      lisinopril 5 MG tablet   Commonly known as: PRINIVIL,ZESTRIL   Take 5 mg by mouth every morning.      methylphenidate 5 MG tablet   Commonly known as: RITALIN   Take 5 mg by mouth 2 (two) times daily. At 8am and lunchtime      omeprazole 20 MG capsule   Commonly known as: PRILOSEC   Take 20 mg by mouth 2 (two) times daily.      polyethylene glycol packet   Commonly known as: MIRALAX / GLYCOLAX   Take 17 g by mouth daily.      sertraline 100 MG tablet   Commonly known as: ZOLOFT   Take 100 mg by mouth daily.      simvastatin 40 MG tablet   Commonly known as: ZOCOR   Take 40 mg by mouth every evening.      XALATAN OP   Place 1 drop into both eyes at bedtime.  BRIEF HPI:  See H&P, Labs, Consult and Test reports for all details in brief, year old with PMH significant for recurrent pancreatitis , Her last admission was on 12-11-2011 for pancreatitis and coagulopathy, presents to emergency department complaining of worsening abdominal pain. She relates abdominal pain on and off, few times per week, not that severe. Over past week prior to admission, she has been experiencing abdominal pain every day.  CONSULTATIONS:   GI  PERTINENT RADIOLOGIC STUDIES: Ct Abdomen Pelvis W Contrast  01/12/2012  *RADIOLOGY REPORT*  Clinical Data: Mid to right lower quadrant abdominal pain, nausea and vomiting, fatigue  CT ABDOMEN AND PELVIS WITH CONTRAST  Technique:  Multidetector CT imaging of the abdomen and pelvis was performed following the standard protocol during bolus administration of intravenous contrast.  Contrast: OMNIPAQUE IOHEXOL 300 MG/ML  SOLN  Comparison: CT abdomen pelvis of 12/15/2011  Findings: The lung bases are clear.  Coronary artery calcifications are present primarily in the distribution of the left anterior descending coronary artery and the left circumflex coronary artery. The liver enhances with no focal abnormality and no ductal  dilatation is seen.  Surgical clips are present from prior cholecystectomy.  The small probably loculated fluid collections around the head of the pancreas on the prior study and adjacent to the uncinate process have further improved in the interval without definite fluid collection.  Some low attenuation remains particularly within the uncinate process.  The changes of pancreatitis involving the head and uncinate process have improved with better definition of the peripancreatic fat planes.  The pancreatic duct throughout the remainder of the body and tail the pancreas is not dilated and the remainder of the peripancreatic fat planes are well preserved.  There is still some soft tissues between the head of the pancreas and the anterior medial right kidney which most likely is due to the prior pancreatitis.  No definite pseudocyst is evident.  The adrenal glands and spleen are unremarkable.  The stomach is not well distended.  The kidneys enhance and there is a cyst emanating from the periphery of the right mid upper kidney which appears stable.  No calculus or mass is seen.  The abdominal aorta is normal in caliber with moderate atheromatous change present.  No adenopathy is seen.  The urinary bladder is not well distended.  The uterus has previously been resected.  No adnexal lesion is seen.  There are scattered rectosigmoid colonic diverticula present.  The terminal ileum is unremarkable.  No bony abnormality is seen.  IMPRESSION:    Further improvement in changes of pancreatitis around the head of the pancreas and uncinate process.  No definite pseudocyst is currently seen.  Probable inflammatory change remains anterior to the right kidney and is stable.  Original Report Authenticated By: Juline Patch, M.D.     PERTINENT LAB RESULTS: CBC:  Basename 01/15/12 0620 01/14/12 1813  WBC 7.5 11.9*  HGB 10.2* 11.1*  HCT 32.0* 34.0*  PLT 333 359   CMET CMP     Component Value Date/Time   NA 138  01/16/2012 0600   K 4.4 01/16/2012 0600   CL 106 01/16/2012 0600   CO2 23 01/16/2012 0600   GLUCOSE 117* 01/16/2012 0600   BUN 8 01/16/2012 0600   CREATININE 1.00 01/16/2012 0600   CALCIUM 8.9 01/16/2012 0600   PROT 6.4 01/15/2012 0620   ALBUMIN 3.1* 01/15/2012 0620   AST 22 01/15/2012 0620   ALT 8 01/15/2012 0620   ALKPHOS 58 01/15/2012 4098  BILITOT 0.4 01/15/2012 0620   GFRNONAA 52* 01/16/2012 0600   GFRAA 60* 01/16/2012 0600    GFR Estimated Creatinine Clearance: 32.8 ml/min (by C-G formula based on Cr of 1).  Basename 01/16/12 0600 01/14/12 1122  LIPASE 66* 79*  AMYLASE -- --   No results found for this basename: CKTOTAL:3,CKMB:3,CKMBINDEX:3,TROPONINI:3 in the last 72 hours No components found with this basename: POCBNP:3 No results found for this basename: DDIMER:2 in the last 72 hours No results found for this basename: HGBA1C:2 in the last 72 hours No results found for this basename: CHOL:2,HDL:2,LDLCALC:2,TRIG:2,CHOLHDL:2,LDLDIRECT:2 in the last 72 hours No results found for this basename: TSH,T4TOTAL,FREET3,T3FREE,THYROIDAB in the last 72 hours No results found for this basename: VITAMINB12:2,FOLATE:2,FERRITIN:2,TIBC:2,IRON:2,RETICCTPCT:2 in the last 72 hours Coags: No results found for this basename: PT:2,INR:2 in the last 72 hours Microbiology: No results found for this or any previous visit (from the past 240 hour(s)).   BRIEF HOSPITAL COURSE:   Active Problems:  Acute on Chronic Abdominal Pain -patient presented with worsening abd pain, she recently had a post-ERCP pancreatitis, a CT scan of the abdomen done a few days prior to this admission showed improvement. Nevertheless patient was admitted to the hospital and placed NPO and given supportive care. She was seen in consult by Dr Elnoria Howard, who did not think that her pain was just explained by pancreatitis. He suggested that patient will need to be seen at a tertiary center for a second opinion. During her stay here, she did have  some intermittent pain, however she was placed on a clear liquid diet yesterday, and today she has been completely pain free, and as a result her diet has been advanced. She has tolerated a full liquid diet earlier this afternoon, and now she just finished eating half a sandwich without much difficulty. She is now anxious to go home and is requesting to be discharged. I have asked her to stay on a low fat diet. Her abdomen is very soft and completely non tender. She is ambulatory in the room as well. Rationale for a second opinion at a tertiary center has been explained to the patient and her husband in detail by me, and they are in complete agreement. They have been asked to see Dr Elnoria Howard in his office within a week or so, so that they can get appropriate referral to a tertiary center.  History of Afib: rate controlled. Coumadin was stopped recently per patient her cardiologist is aware. She is now on ASA   DM (diabetes mellitus) -stable, while NPO she did have a brief period of hypoglycemia, that has resolved with initiation of diet  HTN -resume prior medications   Dyslipidemia -resume fenofibrate and Zocor  Depression -resume Zoloft  TODAY-DAY OF DISCHARGE:  Subjective:   [name] [name] today has no headache,no chest abdominal pain,no new weakness tingling or numbness, feels much better wants to go home today. She is doing well, completely pain free today and has tolerated advancement in her diet.  Objective:   Blood pressure 123/66, pulse 86, temperature 97.7 F (36.5 C), temperature source Oral, resp. rate 20, height 5' (1.524 m), weight 52.6 kg (115 lb 15.4 oz), SpO2 97.00%.  Intake/Output Summary (Last 24 hours) at 01/16/12 1540 Last data filed at 01/16/12 1500  Gross per 24 hour  Intake   1005 ml  Output   1250 ml  Net   -245 ml    Exam Awake Alert, Oriented *3, No new F.N deficits, Normal affect Nectar.AT,PERRAL Supple Neck,No JVD,  No cervical lymphadenopathy appriciated.   Symmetrical Chest wall movement, Good air movement bilaterally, CTAB RRR,No Gallops,Rubs or new Murmurs, No Parasternal Heave +ve B.Sounds, Abd Soft, Non tender, No organomegaly appriciated, No rebound -guarding or rigidity. No Cyanosis, Clubbing or edema, No new Rash or bruise  DISPOSITION: Home   DISCHARGE INSTRUCTIONS:    Follow-up Information    Follow up with HUNG,PATRICK D, MD. Schedule an appointment as soon as possible for a visit in 1 week.   Contact information:   [address] [place] [address] [phone]       Follow up with Cecile Hearing, MD. Schedule an appointment as soon as possible for a visit in 1 week.   Contact information:   [address] [place]dalen [place] [address] [phone]         Total Time spent on discharge equals 45 minutes.  SignedJeoffrey Massed 01/16/2012 3:40 PM

## 2012-01-16 NOTE — Progress Notes (Signed)
[facility] Progress Note  Subjective: Abd pain has improved. Advancing diet.  Objective:  Vital signs in last 24 hours: Temp:  [97.3 F (36.3 C)-97.6 F (36.4 C)] 97.3 F (36.3 C) (04/13 0500) Pulse Rate:  [68-118] 118  (04/13 0500) Resp:  [17-20] 20  (04/13 0500) BP: (104-115)/(67-76) 104/67 mmHg (04/13 0500) SpO2:  [97 %-100 %] 97 % (04/13 0500) Last BM Date: 01/13/12 General:   Alert,  Well-developed,  white female in NAD Heart:  Regular rate and rhythm; no murmurs Abdomen:  Soft, nontender and nondistended. Normal bowel sounds, without guarding, and without rebound.   Extremities:  Without edema. Neurologic:  Alert and  oriented x4;  grossly normal neurologically. Psych:  Alert and cooperative. Normal mood and affect.  Intake/Output from previous day: 04/12 0701 - 04/13 0700 In: 525 [I.V.:525] Out: 650 [Urine:650] Intake/Output this shift: Total I/O In: 240 [P.O.:240] Out: 250 [Urine:250]  Lab Results:  [facility] 01/15/12 0620 01/14/12 1813 01/14/12 1122  WBC 7.5 11.9* 7.5  HGB 10.2* 11.1* 11.8*  HCT 32.0* 34.0* 36.6  PLT 333 359 362   BMET  Basename 01/16/12 0600 01/15/12 0620 01/14/12 1813 01/14/12 1122  NA 138 137 -- 137  K 4.4 4.4 -- 4.4  CL 106 105 -- 101  CO2 23 23 -- 19  GLUCOSE 117* 176* -- 158*  BUN 8 12 -- 17  CREATININE 1.00 0.96 0.99 --  CALCIUM 8.9 8.7 -- 9.8   LFT  Basename 01/15/12 0620  PROT 6.4  ALBUMIN 3.1*  AST 22  ALT 8  ALKPHOS 58  BILITOT 0.4  BILIDIR --  IBILI --   Assessment / Plan:   1) RUQ pain improving. Advance diet as tolerated. 2) Failure to thrive.  3) History of pancreatitis and post-ERCP pancreatitis.   Active Problems:  Pancreatitis  DM (diabetes mellitus)  HTN (hypertension)  Atrial fibrillation    LOS: 2 days   Kayvon Mo T. Russella Dar MD [facility] 01/16/2012, 10:21 AM

## 2012-01-16 NOTE — Progress Notes (Signed)
[name] [name] to be D/C'd Home per MD order.  Discussed with the patient and all questions fully answered.   Kaysey, Berndt  Home Medication Instructions [id number]   Printed on:01/16/12 1653  Medication Information                    Latanoprost (XALATAN OP) Place 1 drop into both eyes at bedtime.            simvastatin (ZOCOR) 40 MG tablet Take 40 mg by mouth every evening.           methylphenidate (RITALIN) 5 MG tablet Take 5 mg by mouth 2 (two) times daily. At 8am and lunchtime           omeprazole (PRILOSEC) 20 MG capsule Take 20 mg by mouth 2 (two) times daily.            Multiple Vitamins-Minerals (CENTRUM SILVER PO) Take 1 tablet by mouth daily.           acetaminophen (TYLENOL) 500 MG tablet Take 500 mg by mouth every 6 (six) hours as needed. for pain.           lisinopril (PRINIVIL,ZESTRIL) 5 MG tablet Take 5 mg by mouth every morning.           sertraline (ZOLOFT) 100 MG tablet Take 100 mg by mouth daily.           aspirin EC 81 MG tablet Take 81 mg by mouth daily.           fenofibrate 160 MG tablet Take 160 mg by mouth daily.           cyproheptadine (PERIACTIN) 4 MG tablet Take 4 mg by mouth 3 (three) times daily as needed. For apetite           ALPRAZolam (XANAX) 1 MG tablet Take 0.5 mg by mouth 4 (four) times daily.           hyoscyamine (LEVSIN, ANASPAZ) 0.125 MG tablet Take 0.125 mg by mouth every 4 (four) hours as needed. For abdominal pain           glimepiride (AMARYL) 4 MG tablet Take 1 tablet (4 mg total) by mouth daily.           polyethylene glycol (MIRALAX / GLYCOLAX) packet Take 17 g by mouth daily.           HYDROcodone-acetaminophen (NORCO) 5-325 MG per tablet Take 1-2 tablets by mouth every 6 (six) hours as needed.             VVS, Skin clean, dry and intact without evidence of skin break down, no evidence of skin tears noted. IV catheter discontinued intact. Site without signs and symptoms of complications.  Dressing and pressure applied.  An After Visit Summary was printed and given to the patient. Follow up appointments , new prescriptions and medication administration times given Patient escorted via WC, and D/C home via private auto.  Cindra Eves, RN 01/16/2012 4:53 PM

## 2012-01-25 ENCOUNTER — Emergency Department (HOSPITAL_COMMUNITY): Payer: Medicare Other

## 2012-01-25 ENCOUNTER — Inpatient Hospital Stay (HOSPITAL_COMMUNITY)
Admission: EM | Admit: 2012-01-25 | Discharge: 2012-02-01 | DRG: 308 | Disposition: A | Discharge status: Skilled Nursing Facility | Payer: Medicare Other | Attending: Internal Medicine | Admitting: Internal Medicine

## 2012-01-25 ENCOUNTER — Encounter (HOSPITAL_COMMUNITY): Payer: Self-pay | Admitting: Emergency Medicine

## 2012-01-25 DIAGNOSIS — G8929 Other chronic pain: Secondary | ICD-10-CM | POA: Diagnosis present

## 2012-01-25 DIAGNOSIS — E1169 Type 2 diabetes mellitus with other specified complication: Secondary | ICD-10-CM | POA: Diagnosis present

## 2012-01-25 DIAGNOSIS — R509 Fever, unspecified: Secondary | ICD-10-CM

## 2012-01-25 DIAGNOSIS — K219 Gastro-esophageal reflux disease without esophagitis: Secondary | ICD-10-CM | POA: Diagnosis present

## 2012-01-25 DIAGNOSIS — E871 Hypo-osmolality and hyponatremia: Secondary | ICD-10-CM

## 2012-01-25 DIAGNOSIS — M545 Low back pain, unspecified: Secondary | ICD-10-CM | POA: Diagnosis present

## 2012-01-25 DIAGNOSIS — E876 Hypokalemia: Secondary | ICD-10-CM

## 2012-01-25 DIAGNOSIS — D72829 Elevated white blood cell count, unspecified: Secondary | ICD-10-CM

## 2012-01-25 DIAGNOSIS — N390 Urinary tract infection, site not specified: Secondary | ICD-10-CM

## 2012-01-25 DIAGNOSIS — R1013 Epigastric pain: Secondary | ICD-10-CM | POA: Diagnosis present

## 2012-01-25 DIAGNOSIS — Z8701 Personal history of pneumonia (recurrent): Secondary | ICD-10-CM

## 2012-01-25 DIAGNOSIS — F3289 Other specified depressive episodes: Secondary | ICD-10-CM | POA: Diagnosis present

## 2012-01-25 DIAGNOSIS — E119 Type 2 diabetes mellitus without complications: Secondary | ICD-10-CM

## 2012-01-25 DIAGNOSIS — I4891 Unspecified atrial fibrillation: Principal | ICD-10-CM

## 2012-01-25 DIAGNOSIS — R52 Pain, unspecified: Secondary | ICD-10-CM

## 2012-01-25 DIAGNOSIS — F329 Major depressive disorder, single episode, unspecified: Secondary | ICD-10-CM | POA: Diagnosis present

## 2012-01-25 DIAGNOSIS — I10 Essential (primary) hypertension: Secondary | ICD-10-CM

## 2012-01-25 DIAGNOSIS — Z7901 Long term (current) use of anticoagulants: Secondary | ICD-10-CM

## 2012-01-25 DIAGNOSIS — Z9089 Acquired absence of other organs: Secondary | ICD-10-CM

## 2012-01-25 DIAGNOSIS — J189 Pneumonia, unspecified organism: Secondary | ICD-10-CM

## 2012-01-25 DIAGNOSIS — D689 Coagulation defect, unspecified: Secondary | ICD-10-CM

## 2012-01-25 DIAGNOSIS — K859 Acute pancreatitis without necrosis or infection, unspecified: Secondary | ICD-10-CM

## 2012-01-25 DIAGNOSIS — Z79899 Other long term (current) drug therapy: Secondary | ICD-10-CM

## 2012-01-25 DIAGNOSIS — D649 Anemia, unspecified: Secondary | ICD-10-CM

## 2012-01-25 DIAGNOSIS — IMO0002 Reserved for concepts with insufficient information to code with codable children: Secondary | ICD-10-CM

## 2012-01-25 DIAGNOSIS — H409 Unspecified glaucoma: Secondary | ICD-10-CM

## 2012-01-25 DIAGNOSIS — K861 Other chronic pancreatitis: Secondary | ICD-10-CM | POA: Diagnosis present

## 2012-01-25 DIAGNOSIS — E7800 Pure hypercholesterolemia, unspecified: Secondary | ICD-10-CM

## 2012-01-25 DIAGNOSIS — Z880 Allergy status to penicillin: Secondary | ICD-10-CM

## 2012-01-25 DIAGNOSIS — K922 Gastrointestinal hemorrhage, unspecified: Secondary | ICD-10-CM

## 2012-01-25 DIAGNOSIS — Z7982 Long term (current) use of aspirin: Secondary | ICD-10-CM

## 2012-01-25 LAB — DIFFERENTIAL
Basophils Absolute: 0 10*3/uL (ref 0.0–0.1)
Basophils Relative: 0 % (ref 0–1)
Lymphocytes Relative: 18 % (ref 12–46)
Neutro Abs: 10.6 10*3/uL — ABNORMAL HIGH (ref 1.7–7.7)

## 2012-01-25 LAB — URINALYSIS, ROUTINE W REFLEX MICROSCOPIC
Glucose, UA: NEGATIVE mg/dL
Hgb urine dipstick: NEGATIVE
Leukocytes, UA: NEGATIVE
Protein, ur: NEGATIVE mg/dL
pH: 8.5 — ABNORMAL HIGH (ref 5.0–8.0)

## 2012-01-25 LAB — PROTIME-INR
INR: 1.09 (ref 0.00–1.49)
Prothrombin Time: 14.3 seconds (ref 11.6–15.2)

## 2012-01-25 LAB — COMPREHENSIVE METABOLIC PANEL
ALT: 7 U/L (ref 0–35)
AST: 19 U/L (ref 0–37)
Albumin: 3.4 g/dL — ABNORMAL LOW (ref 3.5–5.2)
CO2: 19 mEq/L (ref 19–32)
Calcium: 9.9 mg/dL (ref 8.4–10.5)
Chloride: 101 mEq/L (ref 96–112)
GFR calc non Af Amer: 58 mL/min — ABNORMAL LOW (ref >90)
Sodium: 134 mEq/L — ABNORMAL LOW (ref 135–145)

## 2012-01-25 LAB — CBC
MCHC: 32 g/dL (ref 30.0–36.0)
Platelets: 376 10*3/uL (ref 150–400)
RDW: 16.4 % — ABNORMAL HIGH (ref 11.5–15.5)
WBC: 14 10*3/uL — ABNORMAL HIGH (ref 4.0–10.5)

## 2012-01-25 MED ORDER — HYDROCODONE-ACETAMINOPHEN 5-325 MG PO TABS
2.0000 | ORAL_TABLET | Freq: Once | ORAL | Status: AC
  Administered 2012-01-25: 2 via ORAL
  Filled 2012-01-25: qty 2

## 2012-01-25 MED ORDER — LATANOPROST 0.005 % OP SOLN
1.0000 [drp] | Freq: Every day | OPHTHALMIC | Status: DC
  Administered 2012-01-25 – 2012-01-31 (×7): 1 [drp] via OPHTHALMIC
  Filled 2012-01-25 (×2): qty 2.5

## 2012-01-25 MED ORDER — ONDANSETRON HCL 4 MG/2ML IJ SOLN
4.0000 mg | Freq: Four times a day (QID) | INTRAMUSCULAR | Status: DC | PRN
  Administered 2012-01-30 – 2012-02-01 (×2): 4 mg via INTRAVENOUS
  Filled 2012-01-25 (×2): qty 2

## 2012-01-25 MED ORDER — SODIUM CHLORIDE 0.9 % IV SOLN
Freq: Once | INTRAVENOUS | Status: AC
  Administered 2012-01-25: 16:00:00 via INTRAVENOUS

## 2012-01-25 MED ORDER — EMETROL 1.87-1.87-21.5 PO SOLN: 10.0000 mL | ORAL | Status: DC | PRN

## 2012-01-25 MED ORDER — ALPRAZOLAM 0.5 MG PO TABS
0.5000 mg | ORAL_TABLET | Freq: Four times a day (QID) | ORAL | Status: DC | PRN
  Administered 2012-01-27 – 2012-02-01 (×10): 0.5 mg via ORAL
  Filled 2012-01-25 (×10): qty 1

## 2012-01-25 MED ORDER — HYDROCODONE-ACETAMINOPHEN 5-325 MG PO TABS
1.0000 | ORAL_TABLET | Freq: Three times a day (TID) | ORAL | Status: DC
  Administered 2012-01-26 – 2012-02-01 (×19): 1 via ORAL
  Filled 2012-01-25 (×17): qty 1

## 2012-01-25 MED ORDER — POTASSIUM CHLORIDE IN NACL 20-0.9 MEQ/L-% IV SOLN
INTRAVENOUS | Status: AC
  Administered 2012-01-25: 23:00:00 via INTRAVENOUS
  Filled 2012-01-25 (×2): qty 1000

## 2012-01-25 MED ORDER — LISINOPRIL 5 MG PO TABS
5.0000 mg | ORAL_TABLET | Freq: Every morning | ORAL | Status: DC
  Administered 2012-01-26: 5 mg via ORAL
  Filled 2012-01-25 (×2): qty 1

## 2012-01-25 MED ORDER — SERTRALINE HCL 50 MG PO TABS
150.0000 mg | ORAL_TABLET | Freq: Every day | ORAL | Status: DC
  Administered 2012-01-26 – 2012-02-01 (×7): 150 mg via ORAL
  Filled 2012-01-25 (×7): qty 1

## 2012-01-25 MED ORDER — ONDANSETRON HCL 4 MG/2ML IJ SOLN: 4.0000 mg | Freq: Once | INTRAMUSCULAR | Status: DC

## 2012-01-25 MED ORDER — ONDANSETRON HCL 4 MG PO TABS: 4.0000 mg | ORAL_TABLET | Freq: Four times a day (QID) | ORAL | Status: DC | PRN

## 2012-01-25 MED ORDER — FENTANYL CITRATE 0.05 MG/ML IJ SOLN
100.0000 ug | Freq: Once | INTRAMUSCULAR | Status: AC
  Administered 2012-01-25: 100 ug via INTRAVENOUS
  Filled 2012-01-25: qty 2

## 2012-01-25 MED ORDER — FENTANYL CITRATE 0.05 MG/ML IJ SOLN
50.0000 ug | Freq: Once | INTRAMUSCULAR | Status: AC
  Administered 2012-01-25: 50 ug via INTRAVENOUS
  Filled 2012-01-25: qty 2

## 2012-01-25 MED ORDER — HYDROMORPHONE HCL PF 1 MG/ML IJ SOLN
1.0000 mg | INTRAMUSCULAR | Status: DC | PRN
  Administered 2012-01-25 – 2012-01-26 (×2): 1 mg via INTRAVENOUS
  Administered 2012-01-26: 0.5 mg via INTRAVENOUS
  Administered 2012-01-26: 1 mg via INTRAVENOUS
  Administered 2012-01-27 (×3): 0.5 mg via INTRAVENOUS
  Filled 2012-01-25 (×6): qty 1

## 2012-01-25 MED ORDER — ASPIRIN EC 81 MG PO TBEC
81.0000 mg | DELAYED_RELEASE_TABLET | Freq: Every day | ORAL | Status: DC
  Administered 2012-01-26 – 2012-02-01 (×7): 81 mg via ORAL
  Filled 2012-01-25 (×7): qty 1

## 2012-01-25 MED ORDER — METHYLPHENIDATE HCL 5 MG PO TABS
5.0000 mg | ORAL_TABLET | Freq: Two times a day (BID) | ORAL | Status: DC
  Administered 2012-01-26 – 2012-01-27 (×2): 5 mg via ORAL
  Filled 2012-01-25 (×3): qty 1

## 2012-01-25 MED ORDER — SIMVASTATIN 40 MG PO TABS
40.0000 mg | ORAL_TABLET | Freq: Every evening | ORAL | Status: DC
  Administered 2012-01-25 – 2012-01-26 (×2): 40 mg via ORAL
  Filled 2012-01-25 (×4): qty 1

## 2012-01-25 NOTE — ED Notes (Signed)
ems gave 4 zofran iv pta,

## 2012-01-25 NOTE — ED Notes (Signed)
Emily, ED PA at bedside. 

## 2012-01-25 NOTE — ED Notes (Signed)
Admitting MD in with pt    

## 2012-01-25 NOTE — ED Provider Notes (Signed)
History     CSN: [id number]  Arrival date & time 01/25/12  1404   First MD Initiated Contact with Patient 01/25/12 1414      Chief Complaint  Patient presents with  . Abdominal Pain    (Consider location/radiation/quality/duration/timing/severity/associated sxs/prior treatment) HPI Comments: Patient has been in and out of the hospital since February with pancreatitis.  States she has had intermittent relief since the pain began and has had increased pain, nausea, and "stomach upset" for the past three days. Associated decreased appetite, patient only eats once a day, at night.  The pain is described as "pressure."  Patient tends to have pain at night and nausea in the morning.  Patient is only taking her pain medication at bedtime, and only takes one at a time. Denies fevers, recent vomiting, change in bowel habits, urinary symptoms.  Patient was seen by Dr Jeani Hawking as an inpatient and had ERCP performed.  Has followed up with Dr Elnoria Howard since and has been showing improvement by CT.    Patient is a 78 y.o. female presenting with abdominal pain. The history is provided by the patient, the spouse and medical records.  Abdominal Pain The primary symptoms of the illness include abdominal pain and nausea. The primary symptoms of the illness do not include fever, shortness of breath, vomiting, diarrhea or dysuria.  Symptoms associated with the illness do not include constipation, urgency or frequency.    Past Medical History  Diagnosis Date  . Pancreatitis     ~2008 and again in 11/2011 s/p ERCP  . Hypertension   . Glaucoma   . High cholesterol   . GERD (gastroesophageal reflux disease)   . H/O hiatal hernia   . Atrial fibrillation     Coumadin  . Dizziness - light-headed   . Pneumonia 12/2011    "first time I know about"  . Shortness of breath on exertion   . Type II diabetes mellitus   . Depression   . Chronic lower back pain     Past Surgical History  Procedure Date  . Ercp  11/13/2011    Procedure: ENDOSCOPIC RETROGRADE CHOLANGIOPANCREATOGRAPHY (ERCP);  Surgeon: Theda Belfast, MD;  Location: [facility] ENDOSCOPY;  Service: Endoscopy;  Laterality: N/A;  . Tonsillectomy and adenoidectomy ~ 1939  . Cholecystectomy 1990's  . Appendectomy ~ 1960  . Vaginal hysterectomy ~ 1960's  . Dilation and curettage of uterus   . Cataract extraction w/ intraocular lens  implant, bilateral 2000's    Family History  Problem Relation Age of Onset  . Anesthesia problems Neg Hx   . Hypotension Neg Hx   . Malignant hyperthermia Neg Hx   . Pseudochol deficiency Neg Hx     History  Substance Use Topics  . Smoking status: Never Smoker   . Smokeless tobacco: Never Used  . Alcohol Use: No    OB History    Grav Para Term Preterm Abortions TAB SAB Ect Mult Living                  Review of Systems  Constitutional: Negative for fever.  Respiratory: Negative for cough and shortness of breath.   Cardiovascular: Negative for chest pain.  Gastrointestinal: Positive for nausea and abdominal pain. Negative for vomiting, diarrhea, constipation and blood in stool.  Genitourinary: Negative for dysuria, urgency and frequency.  All other systems reviewed and are negative.    Allergies  Lactose intolerance (gi); Penicillins; and Sulfa antibiotics  Home Medications   Current Outpatient  Rx  Name Route Sig Dispense Refill  . ACETAMINOPHEN 500 MG PO TABS Oral Take 500 mg by mouth every 6 (six) hours as needed. for pain.    Marland Kitchen ALPRAZOLAM 1 MG PO TABS Oral Take 0.5 mg by mouth 4 (four) times daily.    . ASPIRIN EC 81 MG PO TBEC Oral Take 81 mg by mouth daily.    Marland Kitchen CYPROHEPTADINE HCL 4 MG PO TABS Oral Take 4 mg by mouth 3 (three) times daily as needed. For apetite    . FENOFIBRATE 160 MG PO TABS Oral Take 160 mg by mouth daily.    Marland Kitchen GLIMEPIRIDE 4 MG PO TABS Oral Take 1 tablet (4 mg total) by mouth daily.    Marland Kitchen HYDROCODONE-ACETAMINOPHEN 5-325 MG PO TABS Oral Take 1-2 tablets by mouth every 6  (six) hours as needed. 30 tablet 0  . HYOSCYAMINE SULFATE 0.125 MG PO TABS Oral Take 0.125 mg by mouth every 4 (four) hours as needed. For abdominal pain    . XALATAN OP Both Eyes Place 1 drop into both eyes at bedtime.     Marland Kitchen LISINOPRIL 5 MG PO TABS Oral Take 5 mg by mouth every morning.    . METHYLPHENIDATE HCL 5 MG PO TABS Oral Take 5 mg by mouth 2 (two) times daily. At 8am and lunchtime    . CENTRUM SILVER PO Oral Take 1 tablet by mouth daily.    Marland Kitchen OMEPRAZOLE 20 MG PO CPDR Oral Take 20 mg by mouth 2 (two) times daily.     . SERTRALINE HCL 100 MG PO TABS Oral Take 100 mg by mouth daily.    Marland Kitchen SIMVASTATIN 40 MG PO TABS Oral Take 40 mg by mouth every evening.      BP 92/55  Pulse 74  Temp(Src) 98.4 F (36.9 C) (Oral)  Resp 22  SpO2 100%  Physical Exam  Nursing note and vitals reviewed. Constitutional: She is oriented to person, place, and time. She appears well-developed and well-nourished.  HENT:  Head: Normocephalic and atraumatic.  Neck: Neck supple.  Cardiovascular: Normal rate, regular rhythm and normal heart sounds.   Pulmonary/Chest: Breath sounds normal. No respiratory distress. She has no wheezes. She has no rales. She exhibits no tenderness.  Abdominal: Soft. Bowel sounds are normal. She exhibits no distension. There is tenderness in the right upper quadrant and epigastric area. There is no rebound and no guarding.  Neurological: She is alert and oriented to person, place, and time. She exhibits normal muscle tone. Coordination normal.  Psychiatric: She has a normal mood and affect. Her behavior is normal. Judgment and thought content normal.    ED Course  Procedures (including critical care time)  Labs Reviewed  CBC - Abnormal; Notable for the following:    WBC 14.0 (*)    Hemoglobin 11.2 (*)    HCT 35.0 (*)    RDW 16.4 (*)    All other components within normal limits  DIFFERENTIAL - Abnormal; Notable for the following:    Neutro Abs 10.6 (*)    All other  components within normal limits  COMPREHENSIVE METABOLIC PANEL - Abnormal; Notable for the following:    Sodium 134 (*)    Albumin 3.4 (*)    GFR calc non Af Amer 58 (*)    GFR calc Af Amer 67 (*)    All other components within normal limits  URINALYSIS, ROUTINE W REFLEX MICROSCOPIC - Abnormal; Notable for the following:    pH 8.5 (*)  All other components within normal limits  LIPASE, BLOOD  PROTIME-INR   No results found.  4:02 PM I have discussed the patient with Dr Fonnie Jarvis who will also see the patient.     Date: 01/25/2012  Rate: 125  Rhythm: atrial fibrillation  QRS Axis: normal  Intervals: normal  ST/T Wave abnormalities: nonspecific T wave changes  Conduction Disutrbances:none  Narrative Interpretation: low voltage  Old EKG Reviewed: unchanged except for rate    1. Recurrent pancreatitis   2. Inadequate pain control   3. Abdominal pain   4. Atrial fibrillation       MDM  Patient with recurrent pancreatitis x 3 months, presenting with uncontrolled pain, nausea, decreased oral intake. Attempted to control pain in ED and transition to PO without success.  Patient prefers admission to control pain. Labs including lipase have returned to normal with exception of WBC elevated at 14.  I did not order repeat CT given patient's multiple CTs recently and last one showing improvement. Admitted to [facility] hospitalist who requests CT abd/pelvis and CXR, which I have ordered.          Dillard Cannon [place], [place] 01/25/12 2015

## 2012-01-25 NOTE — ED Provider Notes (Signed)
Medical screening examination/treatment/procedure(s) were conducted as a shared visit with non-physician practitioner(s) and myself.  I personally evaluated the patient during the encounter  Recurrent abdominal pain suspicious for pancreatitis, moderately tender epigastrium without peritonitis, anticipate consult for admit when labs back.  Patient / Family / Caregiver understand and agree with initial ED impression and plan with expectations set for ED visit.[id number]  Hurman Horn, MD 02/04/12 1243

## 2012-01-25 NOTE — ED Notes (Signed)
abd pain hx of pancreatitis states that she was seen at [facility] days ago for this same thing pain rlq radates to her back. 20g iv rt hand ns kvo,

## 2012-01-25 NOTE — ED Notes (Signed)
[id number]<AV> Expected date:<BR> Expected time: 1:55 PM<BR> Means of arrival:Ambulance<BR> Comments:<BR> M80 -- Abdominal Pain

## 2012-01-25 NOTE — H&P (Signed)
PCP:   Cecile Hearing, MD, MD   Chief Complaint:  Epigastric abdominal pain for 2 months worse over the last 3 days  HPI: 78 year old female who has been patient issues of the pancreas for over 2 months now. She is status post cholecystectomy and was recently discharged from here after having an ERCP done by GI which was unrevealing as to the cause of her recurrent pancreatitis. She was discharged and advised to seek a second opinion at a tertiary care center. She had an appointment with GI in the last week or so and was actually having a very good day and it was decided for her not to make that referral. However last 3-4 days she's had progressive worsening epigastric abdominal pain without any significant nausea vomiting or diarrhea and has not been able to tolerate much by mouth. She is on Norco at home for which she takes only one maybe 2 pills a day for her pain. Her and her husband are very rarely about taking pain medications as they are afraid she is going to become addicted to the Norco. Her pain level averages between 6 and 8 every single day and when she takes one Norco and brings it down to about a 4. So the pain is not well controlled at home. She is here today because of uncontrolled abdominal pain. Worsening abdominal pain she is still maybe only taking 2 pain pills a day. When she was asked by she is very reluctant to take pain pills because she is afraid she is going to get addicted to it. She denied any fevers.  Review of Systems:  Otherwise negative  Past Medical History: Past Medical History  Diagnosis Date  . Pancreatitis     ~2008 and again in 11/2011 s/p ERCP  . Hypertension   . Glaucoma   . High cholesterol   . GERD (gastroesophageal reflux disease)   . H/O hiatal hernia   . Atrial fibrillation     Coumadin  . Dizziness - light-headed   . Pneumonia 12/2011    "first time I know about"  . Shortness of breath on exertion   . Type II diabetes mellitus   .  Depression   . Chronic lower back pain    Past Surgical History  Procedure Date  . Ercp 11/13/2011    Procedure: ENDOSCOPIC RETROGRADE CHOLANGIOPANCREATOGRAPHY (ERCP);  Surgeon: Theda Belfast, MD;  Location: [facility] ENDOSCOPY;  Service: Endoscopy;  Laterality: N/A;  . Tonsillectomy and adenoidectomy ~ 1939  . Cholecystectomy 1990's  . Appendectomy ~ 1960  . Vaginal hysterectomy ~ 1960's  . Dilation and curettage of uterus   . Cataract extraction w/ intraocular lens  implant, bilateral 2000's    Medications: Prior to Admission medications   Medication Sig Start Date End Date Taking? Authorizing Provider  acetaminophen (TYLENOL) 500 MG tablet Take 500 mg by mouth every 6 (six) hours as needed. for pain.   Yes Historical Provider, MD  ALPRAZolam Prudy Feeler) 1 MG tablet Take 0.5 mg by mouth 4 (four) times daily as needed. Sleep/anxiety.   Yes Historical Provider, MD  aspirin EC 81 MG tablet Take 81 mg by mouth daily.   Yes Historical Provider, MD  cyproheptadine (PERIACTIN) 4 MG tablet Take 4 mg by mouth 3 (three) times daily as needed. For apetite   Yes Historical Provider, MD  fenofibrate 160 MG tablet Take 160 mg by mouth daily.   Yes Historical Provider, MD  glimepiride (AMARYL) 4 MG tablet Take 2-4 mg by  mouth See admin instructions. Take as needed as directed based on blood sugar. 01/16/12  Yes Shanker Levora Dredge, MD  HYDROcodone-acetaminophen (NORCO) 5-325 MG per tablet Take 1-2 tablets by mouth every 6 (six) hours as needed. Pain. 01/16/12 01/26/12 Yes Shanker Levora Dredge, MD  hyoscyamine (LEVSIN, ANASPAZ) 0.125 MG tablet Take 0.125 mg by mouth every 4 (four) hours as needed. For abdominal pain   Yes Historical Provider, MD  Latanoprost (XALATAN OP) Place 1 drop into both eyes at bedtime.    Yes Historical Provider, MD  lisinopril (PRINIVIL,ZESTRIL) 5 MG tablet Take 5 mg by mouth every morning.   Yes Historical Provider, MD  methylphenidate (RITALIN) 5 MG tablet Take 5 mg by mouth 2 (two) times  daily. At 9am and lunchtime   Yes Historical Provider, MD  Multiple Vitamins-Minerals (CENTRUM SILVER PO) Take 1 tablet by mouth daily.   Yes Historical Provider, MD  omeprazole (PRILOSEC) 20 MG capsule Take 20 mg by mouth 2 (two) times daily.    Yes Historical Provider, MD  sertraline (ZOLOFT) 100 MG tablet Take 150 mg by mouth daily.    Yes Historical Provider, MD  simvastatin (ZOCOR) 40 MG tablet Take 40 mg by mouth every evening.   Yes Historical Provider, MD    Allergies:   Allergies  Allergen Reactions  . Lactose Intolerance (Gi) Nausea And Vomiting    severe stomach pain  . Penicillins Hives and Itching    "haven't used any since 1970's"  . Sulfa Antibiotics Hives and Itching    "haven't used any since 1970's"    Social History:  reports that she has never smoked. She has never used smokeless tobacco. She reports that she does not drink alcohol or use illicit drugs.  Family History: Family History  Problem Relation Age of Onset  . Anesthesia problems Neg Hx   . Hypotension Neg Hx   . Malignant hyperthermia Neg Hx   . Pseudochol deficiency Neg Hx     Physical Exam: Filed Vitals:   01/25/12 1406 01/25/12 1705 01/25/12 1952  BP: 92/55 110/74 98/55  Pulse: 74 108   Temp: 98.4 F (36.9 C) 97.7 F (36.5 C) 98.9 F (37.2 C)  TempSrc: Oral Oral Oral  Resp: 22 18 20   SpO2: 100% 99% 100%   BP 98/55  Pulse 108  Temp(Src) 98.9 F (37.2 C) (Oral)  Resp 20  SpO2 100% General appearance: alert, cooperative and mild distress Neck: no adenopathy, no carotid bruit, no JVD, supple, symmetrical, trachea midline and thyroid not enlarged, symmetric, no tenderness/mass/nodules Lungs: clear to auscultation bilaterally Heart: regular rate and rhythm, S1, S2 normal, no murmur, click, rub or gallop Abdomen: soft ttp epi area nd, pos bs no r/g nonacute abd Extremities: extremities normal, atraumatic, no cyanosis or edema Pulses: 2+ and symmetric Skin: Skin color, texture, turgor  normal. No rashes or lesions Neurologic: Grossly normal    Labs on Admission:   [facility] 01/25/12 1446  NA 134*  K 4.4  CL 101  CO2 19  GLUCOSE 73  BUN 14  CREATININE 0.92  CALCIUM 9.9  MG --  PHOS --    Basename 01/25/12 1446  AST 19  ALT 7  ALKPHOS 60  BILITOT 0.7  PROT 7.4  ALBUMIN 3.4*    Basename 01/25/12 1446  LIPASE 59  AMYLASE --    Basename 01/25/12 1446  WBC 14.0*  NEUTROABS 10.6*  HGB 11.2*  HCT 35.0*  MCV 86.6  PLT 376    Radiological Exams on  Admission: Dg Chest 2 View  01/25/2012  *RADIOLOGY REPORT*  Clinical Data: Shortness of breath, abdominal pain and weakness.  CHEST - 2 VIEW  Comparison: Chest x-ray 11/24/2011.  Findings: No consolidative airspace disease.  No pleural effusions. Mild diffuse interstitial prominence which appears to reflect predominately airway thickening.  No definite suspicious appearing pulmonary nodules or masses.  Heart size is normal.  Extensive calcification of the mitral annulus.  Mediastinal contours are unremarkable.  Atherosclerosis in the thoracic aorta.  IMPRESSION: 1.  Mild diffuse interstitial prominence appears reflect airway thickening, concerning for potential bronchitis. 2.  Atherosclerosis. 3.  Extensive mitral annular calcifications.  Original Report Authenticated By: Florencia Reasons, M.D.   Ct Abdomen Pelvis W Contrast  01/12/2012  *RADIOLOGY REPORT*  Clinical Data: Mid to right lower quadrant abdominal pain, nausea and vomiting, fatigue  CT ABDOMEN AND PELVIS WITH CONTRAST  Technique:  Multidetector CT imaging of the abdomen and pelvis was performed following the standard protocol during bolus administration of intravenous contrast.  Contrast: OMNIPAQUE IOHEXOL 300 MG/ML  SOLN  Comparison: CT abdomen pelvis of 12/15/2011  Findings: The lung bases are clear.  Coronary artery calcifications are present primarily in the distribution of the left anterior descending coronary artery and the left circumflex  coronary artery. The liver enhances with no focal abnormality and no ductal dilatation is seen.  Surgical clips are present from prior cholecystectomy.  The small probably loculated fluid collections around the head of the pancreas on the prior study and adjacent to the uncinate process have further improved in the interval without definite fluid collection.  Some low attenuation remains particularly within the uncinate process.  The changes of pancreatitis involving the head and uncinate process have improved with better definition of the peripancreatic fat planes.  The pancreatic duct throughout the remainder of the body and tail the pancreas is not dilated and the remainder of the peripancreatic fat planes are well preserved.  There is still some soft tissues between the head of the pancreas and the anterior medial right kidney which most likely is due to the prior pancreatitis.  No definite pseudocyst is evident.  The adrenal glands and spleen are unremarkable.  The stomach is not well distended.  The kidneys enhance and there is a cyst emanating from the periphery of the right mid upper kidney which appears stable.  No calculus or mass is seen.  The abdominal aorta is normal in caliber with moderate atheromatous change present.  No adenopathy is seen.  The urinary bladder is not well distended.  The uterus has previously been resected.  No adnexal lesion is seen.  There are scattered rectosigmoid colonic diverticula present.  The terminal ileum is unremarkable.  No bony abnormality is seen.  IMPRESSION:    Further improvement in changes of pancreatitis around the head of the pancreas and uncinate process.  No definite pseudocyst is currently seen.  Probable inflammatory change remains anterior to the right kidney and is stable.  Original Report Authenticated By: Juline Patch, M.D.    Assessment/Plan Present on Admission:  78 year old female with acute on chronic abdominal pain due to pancreatitis with a  poorly controlled pain at home  .Abdominal pain, chronic, epigastric .Pancreatitis .Atrial fibrillation .HTN (hypertension)  Patient clearly is not taking enough pain medication to control her pain at home. I had a long discussion with her and her husband about how she is going to have to take regular pain medications until we can possibly figure out what  is the cause of her abdominal pain. She's had a full workup here and advised them that they probably will need to go to tertiary care center for a second opinion. I have explained and they agreed to putting her on a scheduled Norco at least 3 times a day they agree to this. She does say that this does help when she takes at home. We'll provide her with some IV Dilaudid as needed for severe breakthrough pain. I also going to repeat a CAT scan of her abdomen and pelvis just to make sure she is not developed any complications from chronic pancreatitis particularly with the elevated white count today. Provided with some IV fluids overnight and repeat labs in the morning. Further recommendations pending on overall results and her response to scheduled pain medications. Thank you.   Dalia Jollie A [phone] 01/25/2012, 9:05 PM

## 2012-01-26 LAB — COMPREHENSIVE METABOLIC PANEL
BUN: 13 mg/dL (ref 6–23)
CO2: 19 mEq/L (ref 19–32)
Calcium: 8.7 mg/dL (ref 8.4–10.5)
Creatinine, Ser: 0.87 mg/dL (ref 0.50–1.10)
GFR calc Af Amer: 72 mL/min — ABNORMAL LOW (ref >90)
GFR calc non Af Amer: 62 mL/min — ABNORMAL LOW (ref >90)
Glucose, Bld: 47 mg/dL — ABNORMAL LOW (ref 70–99)

## 2012-01-26 LAB — GLUCOSE, CAPILLARY
Glucose-Capillary: 31 mg/dL — CL (ref 70–99)
Glucose-Capillary: 53 mg/dL — ABNORMAL LOW (ref 70–99)
Glucose-Capillary: 54 mg/dL — ABNORMAL LOW (ref 70–99)
Glucose-Capillary: 69 mg/dL — ABNORMAL LOW (ref 70–99)
Glucose-Capillary: 105 mg/dL — ABNORMAL HIGH (ref 70–99)

## 2012-01-26 LAB — LIPASE, BLOOD: Lipase: 29 U/L (ref 11–59)

## 2012-01-26 LAB — HEMOGLOBIN A1C: Mean Plasma Glucose: 111 mg/dL (ref <117)

## 2012-01-26 LAB — CBC
Hemoglobin: 11.2 g/dL — ABNORMAL LOW (ref 12.0–15.0)
Platelets: 335 10*3/uL (ref 150–400)
RBC: 3.93 MIL/uL (ref 3.87–5.11)
WBC: 15.2 10*3/uL — ABNORMAL HIGH (ref 4.0–10.5)

## 2012-01-26 LAB — MRSA PCR SCREENING: MRSA by PCR: NEGATIVE

## 2012-01-26 MED ORDER — DIGOXIN 125 MCG PO TABS
0.1250 mg | ORAL_TABLET | Freq: Every day | ORAL | Status: DC
  Administered 2012-01-26 – 2012-02-01 (×7): 0.125 mg via ORAL
  Filled 2012-01-26 (×8): qty 1

## 2012-01-26 MED ORDER — SODIUM CHLORIDE 0.9 % IV BOLUS (SEPSIS)
1000.0000 mL | Freq: Once | INTRAVENOUS | Status: AC
  Administered 2012-01-26: 1000 mL via INTRAVENOUS

## 2012-01-26 MED ORDER — DEXTROSE 5 % IV SOLN
5.0000 mg/h | INTRAVENOUS | Status: DC
  Administered 2012-01-26: 15 mg/h via INTRAVENOUS
  Filled 2012-01-26: qty 100

## 2012-01-26 MED ORDER — METOPROLOL TARTRATE 1 MG/ML IV SOLN
5.0000 mg | Freq: Once | INTRAVENOUS | Status: AC
  Administered 2012-01-26: 5 mg via INTRAVENOUS
  Filled 2012-01-26: qty 5

## 2012-01-26 MED ORDER — GLUCOSE 40 % PO GEL
1.0000 | Freq: Once | ORAL | Status: AC
  Administered 2012-01-26: 37.5 g via ORAL
  Filled 2012-01-26: qty 1.65

## 2012-01-26 MED ORDER — GLUCOSE 40 % PO GEL
ORAL | Status: AC
  Administered 2012-01-26: 37.5 g
  Filled 2012-01-26: qty 1

## 2012-01-26 MED ORDER — DILTIAZEM HCL 30 MG PO TABS
30.0000 mg | ORAL_TABLET | Freq: Four times a day (QID) | ORAL | Status: DC
  Administered 2012-01-27: 30 mg via ORAL
  Filled 2012-01-26 (×7): qty 1

## 2012-01-26 MED ORDER — SODIUM CHLORIDE 0.9 % IV BOLUS (SEPSIS)
250.0000 mL | Freq: Once | INTRAVENOUS | Status: AC
  Administered 2012-01-26: 250 mL via INTRAVENOUS

## 2012-01-26 MED ORDER — GLUCERNA SHAKE PO LIQD
237.0000 mL | Freq: Two times a day (BID) | ORAL | Status: DC
  Administered 2012-01-27 – 2012-02-01 (×8): 237 mL via ORAL
  Filled 2012-01-26 (×14): qty 237

## 2012-01-26 MED ORDER — INSULIN ASPART 100 UNIT/ML ~~LOC~~ SOLN
0.0000 [IU] | Freq: Three times a day (TID) | SUBCUTANEOUS | Status: DC
  Administered 2012-01-27: 3 [IU] via SUBCUTANEOUS
  Administered 2012-01-27 – 2012-01-28 (×2): 2 [IU] via SUBCUTANEOUS
  Administered 2012-01-28: 3 [IU] via SUBCUTANEOUS
  Administered 2012-01-28 – 2012-01-29 (×2): 2 [IU] via SUBCUTANEOUS
  Administered 2012-01-29: 1 [IU] via SUBCUTANEOUS
  Administered 2012-01-29: 3 [IU] via SUBCUTANEOUS
  Administered 2012-01-30: 2 [IU] via SUBCUTANEOUS
  Administered 2012-01-30: 1 [IU] via SUBCUTANEOUS
  Administered 2012-01-31: 2 [IU] via SUBCUTANEOUS
  Administered 2012-01-31 (×2): 1 [IU] via SUBCUTANEOUS
  Administered 2012-02-01: 2 [IU] via SUBCUTANEOUS
  Administered 2012-02-01: 1 [IU] via SUBCUTANEOUS

## 2012-01-26 MED ORDER — SODIUM CHLORIDE 0.9 % IV SOLN
INTRAVENOUS | Status: DC
  Administered 2012-01-26 (×2): 1000 mL via INTRAVENOUS
  Administered 2012-01-27: 07:00:00 via INTRAVENOUS

## 2012-01-26 MED ORDER — SODIUM CHLORIDE 0.9 % IV BOLUS (SEPSIS): 1000.0000 mL | Freq: Once | INTRAVENOUS | Status: DC

## 2012-01-26 NOTE — Progress Notes (Signed)
INITIAL ADULT NUTRITION ASSESSMENT Date: 01/26/2012   Time: 10:49 AM Reason for Assessment: Nutrition Risk for unintentional weight loss > 10 lb over 1 month.   ASSESSMENT: Female 78 y.o.  Dx: Abdominal pain, chronic, epigastric  Hx:  Past Medical History  Diagnosis Date  . Pancreatitis     ~2008 and again in 11/2011 s/p ERCP  . Hypertension   . Glaucoma   . High cholesterol   . GERD (gastroesophageal reflux disease)   . H/O hiatal hernia   . Atrial fibrillation     Coumadin  . Dizziness - light-headed   . Pneumonia 12/2011    "first time I know about"  . Shortness of breath on exertion   . Type II diabetes mellitus   . Depression   . Chronic lower back pain     Related Meds:  Scheduled Meds:   . sodium chloride   Intravenous Once  . aspirin EC  81 mg Oral Daily  . digoxin  0.125 mg Oral Daily  . fentaNYL  100 mcg Intravenous Once  . fentaNYL  50 mcg Intravenous Once  . HYDROcodone-acetaminophen  1 tablet Oral TID AC  . HYDROcodone-acetaminophen  2 tablet Oral Once  . latanoprost  1 drop Both Eyes QHS  . lisinopril  5 mg Oral q morning - 10a  . methylphenidate  5 mg Oral BID  . metoprolol  5 mg Intravenous Once  . sertraline  150 mg Oral Daily  . simvastatin  40 mg Oral QPM  . sodium chloride  250 mL Intravenous Once  . DISCONTD: ondansetron (ZOFRAN) IV  4 mg Intravenous Once   Continuous Infusions:   . sodium chloride 1,000 mL (01/26/12 0959)  . 0.9 % NaCl with KCl 20 mEq / L Stopped (01/26/12 0946)  . diltiazem (CARDIZEM) infusion 10 mg/hr (01/26/12 1000)   PRN Meds:.ALPRAZolam, HYDROmorphone, ondansetron (ZOFRAN) IV, ondansetron, DISCONTD: anti-nausea   Ht: 5' (152.4 cm)  Wt: 118 lb 1.6 oz (53.57 kg)  Ideal Wt: 45.5 kg  % Ideal Wt: 118%  Usual Wt: 130 lb.  % Usual Wt: 90.7%  Wt Readings from Last 3 Encounters:  01/25/12 118 lb 1.6 oz (53.57 kg)  01/15/12 115 lb 15.4 oz (52.6 kg)  12/19/11 119 lb 7.8 oz (54.2 kg)  *Weight up 3 lb. From weight on  01/15/12. Patient still down 12 lb. From UBW of 130 lb. in February (9.2% weight loss from baseline over 2 months.)  Body mass index is 23.06 kg/(m^2). (WNL)  Food/Nutrition Related Hx: Patient familiar from last hospitalization. Patient's husband reports her appetite had gotten better but it has gotten bad again. He stated she still has not started her appetite stimulant. He reported she is still eating about 33% of her meals. Patient with documented PO intake 100% if full liquid diet. Patient's husband requested diet education material for pancreatitis.   Labs:  CMP     Component Value Date/Time   NA 133* 01/26/2012 0500   K 4.0 01/26/2012 0500   CL 104 01/26/2012 0500   CO2 19 01/26/2012 0500   GLUCOSE 47* 01/26/2012 0500   BUN 13 01/26/2012 0500   CREATININE 0.87 01/26/2012 0500   CALCIUM 8.7 01/26/2012 0500   PROT 6.9 01/26/2012 0500   ALBUMIN 3.1* 01/26/2012 0500   AST 23 01/26/2012 0500   ALT 8 01/26/2012 0500   ALKPHOS 55 01/26/2012 0500   BILITOT 0.5 01/26/2012 0500   GFRNONAA 62* 01/26/2012 0500   GFRAA 72* 01/26/2012 0500  Intake/Output Summary (Last 24 hours) at 01/26/12 1056 Last data filed at 01/26/12 0700  Gross per 24 hour  Intake   1070 ml  Output      0 ml  Net   1070 ml   *fluid retention noted  Diet Order: Cardiac  Supplements/Tube Feeding: none at this time  IVF:    sodium chloride Last Rate: 1,000 mL (01/26/12 0959)  0.9 % NaCl with KCl 20 mEq / L Last Rate: Stopped (01/26/12 0946)  diltiazem (CARDIZEM) infusion Last Rate: 10 mg/hr (01/26/12 1000)    Estimated Nutritional Needs:   Kcal: 1100-1400    Protein: 64.3-80 grams    Fluid: 1 ml per kcal   NUTRITION DIAGNOSIS: -Inadequate oral intake (NI-2.1).  Status: Ongoing  RELATED TO: poor appetite  AS EVIDENCE BY: husband reports patient with poor appetite and PO intake of 33% at meals.   MONITORING/EVALUATION(Goals): Weight trends, PO intake, Labs, I/O's 1. PO intake > 75% at meals and supplements.   2. Minimize weight loss.  EDUCATION NEEDS: -Education needs addressed  INTERVENTION: 1. Discussed and provided handout for nutrition therapy for pancreatitis per husbands request. He was without further questions and verbalized understanding. Expect good compliance.  2. Will order vanilla Glucerna BID. Provides 440 kcal and 20 grams of protein. 3. Will order Magic cup once daily.  4.RD to follow for nutrition plan of care.   Dietitian [phone]  DOCUMENTATION CODES Per approved criteria  -Severe malnutrition in the context of chronic illness  *Patient meets criteria for severe malnutrition in the context of chronic illness due to consuming less than or equal to 75% of estimated energy requirements for > or equal to 1 month and a weight loss of > 5% from base line over 1 month.    Iven Finn [facility] 01/26/2012, 10:49 AM

## 2012-01-26 NOTE — Progress Notes (Signed)
NP on call to floor to assess pt around 0230. Orders received for 5mg  IV metoprolol; 250cc bolus, orders implemented. HR down to the 100's-110's for the remainder of the night occasionally up in the 120/130's but only momentarily. BP up to 105/69 after bolus. Will continue to monitor pt closely. Justyna Timoney, Ok Edwards RN

## 2012-01-26 NOTE — Progress Notes (Signed)
Subjective: Spoke with [name] who states that patient was sent home with Digoxin and Cardizem in March but could not get in touch with her PMD to get them refilled so she just stopped taking them.  Interval history: Called by nurse for pt with HR 148 and BP in low 100's.   I arrived at bedside and pt was alert but with some psychomotor retardation. She was able to answer questions accurately, but did require some reorientation during the conversation. Pt denies any pain at present and states that she feels well enough to go home. She denies any chest pain, palpitations or dizziness. The patient is drowsy and pale appearing but is non-toxic and in NAD.  Objective: Filed Vitals:   01/25/12 1952 01/25/12 2155 01/26/12 0109 01/26/12 0539  BP: 98/55 105/67 99/61 105/69  Pulse:  84 103 108  Temp: 98.9 F (37.2 C) 97.7 F (36.5 C) 98.5 F (36.9 C) 99.3 F (37.4 C)  TempSrc: Oral Oral Oral Oral  Resp: 20 22 22 22   Height:  5' (1.524 m)    Weight:  53.57 kg (118 lb 1.6 oz)    SpO2: 100% 99% 96% 93%   Weight change:   Intake/Output Summary (Last 24 hours) at 01/26/12 0911 Last data filed at 01/26/12 0700  Gross per 24 hour  Intake   1070 ml  Output      0 ml  Net   1070 ml    General: Alert, awake, oriented x3, in no acute distress. Pale appearing HEENT: [place]/AT PEERL, EOMI Neck: Trachea midline,  no masses, no thyromegal,y no JVD, no carotid bruit OROPHARYNX:  Moist, No exudate/ erythema/lesions.  Heart: Regular rate and rhythm, without murmurs, rubs, gallops, PMI non-displaced, no heaves or thrills on palpation.  Lungs: Clear to auscultation, no wheezing or rhonchi noted. No increased vocal fremitus resonant to percussion  Abdomen: Soft, nontender, nondistended, positive bowel sounds, no masses no hepatosplenomegaly noted..  Neuro: No focal neurological deficits noted cranial nerves II through XII grossly intact.  Musculoskeletal: No warm swelling or erythema around joints, no  spinal tenderness noted. Psychiatric: Patient alert and oriented x3.    Lab Results:  Basename 01/26/12 0500 01/25/12 1446  NA 133* 134*  K 4.0 4.4  CL 104 101  CO2 19 19  GLUCOSE 47* 73  BUN 13 14  CREATININE 0.87 0.92  CALCIUM 8.7 9.9  MG -- --  PHOS -- --    Basename 01/26/12 0500 01/25/12 1446  AST 23 19  ALT 8 7  ALKPHOS 55 60  BILITOT 0.5 0.7  PROT 6.9 7.4  ALBUMIN 3.1* 3.4*    Basename 01/26/12 0500 01/25/12 1446  LIPASE 29 59  AMYLASE -- --    Basename 01/26/12 0500 01/25/12 1446  WBC 15.2* 14.0*  NEUTROABS -- 10.6*  HGB 11.2* 11.2*  HCT 35.2* 35.0*  MCV 89.6 86.6  PLT 335 376   No results found for this basename: CKTOTAL:3,CKMB:3,CKMBINDEX:3,TROPONINI:3 in the last 72 hours No components found with this basename: POCBNP:3 No results found for this basename: DDIMER:2 in the last 72 hours No results found for this basename: HGBA1C:2 in the last 72 hours No results found for this basename: CHOL:2,HDL:2,LDLCALC:2,TRIG:2,CHOLHDL:2,LDLDIRECT:2 in the last 72 hours No results found for this basename: TSH,T4TOTAL,FREET3,T3FREE,THYROIDAB in the last 72 hours No results found for this basename: VITAMINB12:2,FOLATE:2,FERRITIN:2,TIBC:2,IRON:2,RETICCTPCT:2 in the last 72 hours  Micro Results: No results found for this or any previous visit (from the past 240 hour(s)).  Studies/Results: Dg Chest 2  View  01/25/2012  *RADIOLOGY REPORT*  Clinical Data: Shortness of breath, abdominal pain and weakness.  CHEST - 2 VIEW  Comparison: Chest x-ray 11/24/2011.  Findings: No consolidative airspace disease.  No pleural effusions. Mild diffuse interstitial prominence which appears to reflect predominately airway thickening.  No definite suspicious appearing pulmonary nodules or masses.  Heart size is normal.  Extensive calcification of the mitral annulus.  Mediastinal contours are unremarkable.  Atherosclerosis in the thoracic aorta.  IMPRESSION: 1.  Mild diffuse interstitial  prominence appears reflect airway thickening, concerning for potential bronchitis. 2.  Atherosclerosis. 3.  Extensive mitral annular calcifications.  Original Report Authenticated By: Florencia Reasons, M.D.   Ct Abdomen Pelvis W Contrast  01/12/2012  *RADIOLOGY REPORT*  Clinical Data: Mid to right lower quadrant abdominal pain, nausea and vomiting, fatigue  CT ABDOMEN AND PELVIS WITH CONTRAST  Technique:  Multidetector CT imaging of the abdomen and pelvis was performed following the standard protocol during bolus administration of intravenous contrast.  Contrast: OMNIPAQUE IOHEXOL 300 MG/ML  SOLN  Comparison: CT abdomen pelvis of 12/15/2011  Findings: The lung bases are clear.  Coronary artery calcifications are present primarily in the distribution of the left anterior descending coronary artery and the left circumflex coronary artery. The liver enhances with no focal abnormality and no ductal dilatation is seen.  Surgical clips are present from prior cholecystectomy.  The small probably loculated fluid collections around the head of the pancreas on the prior study and adjacent to the uncinate process have further improved in the interval without definite fluid collection.  Some low attenuation remains particularly within the uncinate process.  The changes of pancreatitis involving the head and uncinate process have improved with better definition of the peripancreatic fat planes.  The pancreatic duct throughout the remainder of the body and tail the pancreas is not dilated and the remainder of the peripancreatic fat planes are well preserved.  There is still some soft tissues between the head of the pancreas and the anterior medial right kidney which most likely is due to the prior pancreatitis.  No definite pseudocyst is evident.  The adrenal glands and spleen are unremarkable.  The stomach is not well distended.  The kidneys enhance and there is a cyst emanating from the periphery of the right mid  upper kidney which appears stable.  No calculus or mass is seen.  The abdominal aorta is normal in caliber with moderate atheromatous change present.  No adenopathy is seen.  The urinary bladder is not well distended.  The uterus has previously been resected.  No adnexal lesion is seen.  There are scattered rectosigmoid colonic diverticula present.  The terminal ileum is unremarkable.  No bony abnormality is seen.  IMPRESSION:    Further improvement in changes of pancreatitis around the head of the pancreas and uncinate process.  No definite pseudocyst is currently seen.  Probable inflammatory change remains anterior to the right kidney and is stable.  Original Report Authenticated By: Juline Patch, M.D.    Medications: I have reviewed the patient's current medications. Scheduled Meds:   . sodium chloride   Intravenous Once  . aspirin EC  81 mg Oral Daily  . digoxin  0.125 mg Oral Daily  . fentaNYL  100 mcg Intravenous Once  . fentaNYL  50 mcg Intravenous Once  . HYDROcodone-acetaminophen  1 tablet Oral TID AC  . HYDROcodone-acetaminophen  2 tablet Oral Once  . latanoprost  1 drop Both Eyes QHS  . lisinopril  5 mg Oral q morning - 10a  . methylphenidate  5 mg Oral BID  . metoprolol  5 mg Intravenous Once  . sertraline  150 mg Oral Daily  . simvastatin  40 mg Oral QPM  . sodium chloride  250 mL Intravenous Once  . DISCONTD: ondansetron (ZOFRAN) IV  4 mg Intravenous Once   Continuous Infusions:   . 0.9 % NaCl with KCl 20 mEq / L 100 mL/hr at 01/25/12 2314  . diltiazem (CARDIZEM) infusion     PRN Meds:.ALPRAZolam, HYDROmorphone, ondansetron (ZOFRAN) IV, ondansetron, DISCONTD: anti-nausea Assessment/Plan: Patient Active Hospital Problem List: Atrial fibrillation  With RVR(01/26/2012)   Assessment: Pt given cardizem bolus by rapid response team. Will start on Cardizem drip and start oral digoxin. Presently her BP is marginal. So will also give a 500 ml bolus of 0.9NS and reassess.dependent  on the patient's pressures I will consider reinstituting and Cardizem in short acting dose as many 30 mg every 6 hours and reassess for heart rate and blood pressure control.    Plan: Transfer to SDU for further monitoring. Updated husband.  Abdominal pain, chronic, epigastric (01/25/2012)   Assessment: Patient denies any further abdominal pain at this time. I discussed the patient's condition with her husband. I will cancel the CT scan as I find no compelling reason for rescanning the patient's abdomen at this time.   Pancreatitis (11/13/2011)   Assessment: Patient has had a history of pancreatitis in the past however at this time he is not presenting with symptoms of pancreatitis today. Her symptoms from last night seemed to have resolved we will advance her diet     HTN (hypertension) (11/13/2011)   Assessment: Presently the patient is borderline hypotensive. She sees to be somewhat volume depleted we will continue to monitor her blood pressures and fluid resuscitate as necessary. Hypertensive medications to BE held at this time       LOS: 1 day

## 2012-01-26 NOTE — Progress Notes (Signed)
Pt's HR sustaining between 110's-130's, showing a fib on the monitor. Pt is resting with no complaints. BP 99/61. Paged MD on call.

## 2012-01-26 NOTE — Progress Notes (Addendum)
Inpatient Diabetes Program Recommendations  AACE/ADA: New Consensus Statement on Inpatient Glycemic Control (2009)  Target Ranges:  Prepandial:   less than 140 mg/dL      Peak postprandial:   less than 180 mg/dL (1-2 hours)      Critically ill patients:  140 - 180 mg/dL   Reason for Visit: Hypoglycemia.  No order for CBG's.  Patient has history of type 2 diabetes  Inpatient Diabetes Program Recommendations Correction (SSI): No CBG's ordered.  Patient has history of type 2 diabetes. HgbA1C: Request draw unless one is known within last 2- 1/2 months.  Note: Lab glucose of 47 this morning.  Patient currently on no diabetes medication, but took Amaryl prior to admission per Med Rec.  According to recommendation of the American Diabetes Association Guidelines to Managing Inpatients with Diabetes, patients with a known history of diabetes need to have CBG's checked minimally four times a day.  Request MD complete the Glycemic Control Order Set for this patient.   Results for HARVEEN, FLESCH (MRN [id number]) as of 01/26/2012 13:00  Ref. Range 01/25/2012 14:46 01/26/2012 05:00  Glucose Latest Range: 70-99 mg/dL 73 47 (L)  Hypoglycemia needs to be addressed.  Thank you.  Ahamed Hofland S. Elsie Lincoln, RN, CNS, CDE [phone]

## 2012-01-26 NOTE — Progress Notes (Signed)
Pt in A-Fib on admission w/ rate of 125. Chronic h/o same.  Rate trending up to 140's +, sustained. Pt w/o symptoms. BP 99/50's. NS bolus (250cc) given and Metoprolol 5 mg IV. Now rate in low 100's and has remained so through-out evening.

## 2012-01-26 NOTE — Progress Notes (Signed)
Patient's hr is irregular this am. Afib ranging from 118-158. bp stable 114/60. Patient does not complain of any pain at this time. Patient is alert, but did not remember how she arrived at the hospital. MD notified. MD instructed to notify rapid response and start cardiazem drip. Rapid response nurse arrived and cardiazem started. HR is trending down.  MD wants patient transferred to ICU.  BP is 100/60 with a HR ranging from 100-125.  Transferred to ICU on monitor.  Report given to RN.

## 2012-01-26 NOTE — Progress Notes (Signed)
CARE MANAGEMENT NOTE 01/26/2012  Patient:  [name] [name], [name] [name]   Account Number:  [id number]  Date Initiated:  01/26/2012  Documentation initiated by:  Raquan Iannone  Subjective/Objective Assessment:   pt admitted with uncontrolled a.fib admitted to sdu with iv cardizem drip     Action/Plan:   lives at home with spouse has her support group   Anticipated DC Date:  01/29/2012   Anticipated DC Plan:  HOME/SELF CARE  In-house referral  NA      DC Planning Services  NA      [facility] Choice  NA   Choice offered to / List presented to:  NA   DME arranged  NA      DME agency  NA     HH arranged  NA      HH agency  NA   Status of service:  In process, will continue to follow Medicare Important Message given?  YES (If response is "NO", the following Medicare IM given date fields will be blank) Date Medicare IM given:  01/25/2012 Date Additional Medicare IM given:    Discharge Disposition:    Per UR Regulation:  Reviewed for med. necessity/level of care/duration of stay  If discussed at Long Length of Stay Meetings, dates discussed:    Comments:  [id number]/Daivik Overley Lorrin Mais Case Management [phone]

## 2012-01-27 LAB — GLUCOSE, CAPILLARY
Glucose-Capillary: 94 mg/dL (ref 70–99)
Glucose-Capillary: 83 mg/dL (ref 70–99)
Glucose-Capillary: 203 mg/dL — ABNORMAL HIGH (ref 70–99)
Glucose-Capillary: 178 mg/dL — ABNORMAL HIGH (ref 70–99)

## 2012-01-27 LAB — BASIC METABOLIC PANEL
Chloride: 102 mEq/L (ref 96–112)
GFR calc Af Amer: >90 mL/min (ref >90)
Potassium: 3.9 mEq/L (ref 3.5–5.1)
Sodium: 131 mEq/L — ABNORMAL LOW (ref 135–145)

## 2012-01-27 MED ORDER — PANTOPRAZOLE SODIUM 40 MG PO TBEC
40.0000 mg | DELAYED_RELEASE_TABLET | Freq: Every day | ORAL | Status: DC
  Administered 2012-01-27 – 2012-02-01 (×6): 40 mg via ORAL
  Filled 2012-01-27 (×6): qty 1

## 2012-01-27 MED ORDER — GLUCAGON HCL (RDNA) 1 MG IJ SOLR: 1.0000 mg | Freq: Once | INTRAMUSCULAR | Status: DC | PRN

## 2012-01-27 MED ORDER — DEXTROSE 5 % IV SOLN: INTRAVENOUS | Status: DC

## 2012-01-27 MED ORDER — METHYLPHENIDATE HCL 5 MG PO TABS
5.0000 mg | ORAL_TABLET | Freq: Two times a day (BID) | ORAL | Status: DC
  Administered 2012-01-27 – 2012-02-01 (×11): 5 mg via ORAL
  Filled 2012-01-27 (×11): qty 1

## 2012-01-27 MED ORDER — METOPROLOL TARTRATE 1 MG/ML IV SOLN
5.0000 mg | Freq: Once | INTRAVENOUS | Status: AC
  Administered 2012-01-27: 5 mg via INTRAVENOUS

## 2012-01-27 MED ORDER — DEXTROSE 50 % IV SOLN: 25.0000 mL | Freq: Once | INTRAVENOUS | Status: DC | PRN

## 2012-01-27 MED ORDER — GLUCAGON HCL (RDNA) 1 MG IJ SOLR: 1.0000 mg | Freq: Once | INTRAMUSCULAR | Status: AC | PRN

## 2012-01-27 MED ORDER — GLUCOSE-VITAMIN C 4-6 GM-MG PO CHEW: 4.0000 | CHEWABLE_TABLET | ORAL | Status: DC | PRN

## 2012-01-27 MED ORDER — HYDROCODONE-ACETAMINOPHEN 5-325 MG PO TABS
1.0000 | ORAL_TABLET | ORAL | Status: DC | PRN
Start: 2012-01-27 — End: 2012-02-01
  Administered 2012-01-27 – 2012-01-30 (×5): 1 via ORAL
  Filled 2012-01-27 (×7): qty 1

## 2012-01-27 MED ORDER — SODIUM CHLORIDE 0.9 % IV BOLUS (SEPSIS)
250.0000 mL | Freq: Once | INTRAVENOUS | Status: AC
  Administered 2012-01-27: 250 mL via INTRAVENOUS

## 2012-01-27 MED ORDER — METOPROLOL TARTRATE 1 MG/ML IV SOLN
INTRAVENOUS | Status: AC
  Administered 2012-01-27: 5 mg via INTRAVENOUS
  Filled 2012-01-27: qty 5

## 2012-01-27 MED ORDER — DEXTROSE 50 % IV SOLN: 50.0000 mL | Freq: Once | INTRAVENOUS | Status: DC | PRN

## 2012-01-27 MED ORDER — SODIUM CHLORIDE 0.9 % IV BOLUS (SEPSIS)
250.0000 mL | Freq: Once | INTRAVENOUS | Status: AC
  Administered 2012-01-26: 250 mL via INTRAVENOUS

## 2012-01-27 MED ORDER — GLUCOSE 40 % PO GEL: 1.0000 | ORAL | Status: DC | PRN

## 2012-01-27 MED ORDER — DEXTROSE 50 % IV SOLN: 50.0000 mL | Freq: Once | INTRAVENOUS | Status: AC | PRN

## 2012-01-27 MED ORDER — GLUCAGON HCL (RDNA) 1 MG IJ SOLR: 0.5000 mg | Freq: Once | INTRAMUSCULAR | Status: DC | PRN

## 2012-01-27 MED ORDER — METOPROLOL TARTRATE 1 MG/ML IV SOLN
5.0000 mg | Freq: Once | INTRAVENOUS | Status: AC
  Administered 2012-01-27: 5 mg via INTRAVENOUS
  Filled 2012-01-27: qty 5

## 2012-01-27 MED ORDER — DILTIAZEM HCL 30 MG PO TABS
30.0000 mg | ORAL_TABLET | Freq: Four times a day (QID) | ORAL | Status: DC
  Administered 2012-01-27 – 2012-01-28 (×2): 30 mg via ORAL
  Filled 2012-01-27 (×7): qty 1

## 2012-01-27 MED ORDER — GLUCOSE 40 % PO GEL
1.0000 | Freq: Once | ORAL | Status: DC | PRN
Start: 2012-01-27 — End: 2012-01-27

## 2012-01-27 MED ORDER — GLUCAGON HCL (RDNA) 1 MG IJ SOLR: 0.5000 mg | Freq: Once | INTRAMUSCULAR | Status: AC | PRN

## 2012-01-27 MED ORDER — ATORVASTATIN CALCIUM 20 MG PO TABS
20.0000 mg | ORAL_TABLET | Freq: Every day | ORAL | Status: DC
  Administered 2012-01-27 – 2012-01-31 (×5): 20 mg via ORAL
  Filled 2012-01-27 (×6): qty 1

## 2012-01-27 NOTE — Progress Notes (Signed)
Pt sustaining HR in 130's-150's.  Short bursts up to 170.  BP currently 119/84. Pt slightly confused. Given 0.5 mg dilaudid for abdominal pain 8/10. NP text paged. Spoke to NP about new orders to be placed.  Will continue to monitor patient.

## 2012-01-27 NOTE — Progress Notes (Addendum)
Pt heart rate in 130's-150's.  BP 118/56.  Patient asymptomatic.  Paged NP for orders and updated on Pt's overall status, including consistently low normal CBG's.  Pt able to snack on crackers and juice.  Metoprolol IV 5 mg ordered. Medication given at 0305 with a BP of 102/44 and heart rate of 113 afib.  At 0315 Pt BP 84/53 with heart rate of 95.  Call received at 0345 from NP about current status and vitals.  Per NP Schorr, notify if MAP is less than 60. No further orders received at this time.  Will continue to monitor.  Pt currently resting comfortably in bed.    COSIGN -Nakeita Styles WHITE RN

## 2012-01-27 NOTE — Progress Notes (Addendum)
Pt's rate increased gradually up to 140's + sustained. Has been unable to take her PO Cardizem due to low BP's. Pt given Lopressor 5 mg IV @ approx 0300. Rate has remained in 90's-110's. BP this am 108/58. Pt w/ persistent low  CBG's requiring multiple snacks through-out the night. RN reports that pt has been increasingly confused and intermittently agitated through-out shift as well.    COSIGNED IN ERROR, ORIGINALLY WRITTEN BY K SCHORR NP-Manas Hickling WHITE RN

## 2012-01-27 NOTE — Progress Notes (Signed)
Subjective: A little stomach upset after eating sausage for breakfast this am. HR 120-130s.  Objective: Vital signs in last 24 hours: Temp:  [97.7 F (36.5 C)-98.3 F (36.8 C)] 97.9 F (36.6 C) (04/24 0800) Pulse Rate:  [40-144] 40  (04/24 0800) Resp:  [7-25] 24  (04/24 0800) BP: (69-121)/(35-84) 121/74 mmHg (04/24 0800) SpO2:  [90 %-100 %] 91 % (04/24 0800) Weight:  [54.9 kg (121 lb 0.5 oz)] 54.9 kg (121 lb 0.5 oz) (04/24 0400) Weight change: 1.33 kg (2 lb 14.9 oz) Last BM Date: 01/24/12  Intake/Output from previous day: 04/23 0701 - 04/24 0700 In: 4160.7 [P.O.:462; I.V.:3698.7] Out: 1325 [Urine:1325]     Physical Exam: General: Alert, awake, oriented x3, in no acute distress. HEENT: No bruits, no goiter. Heart: irregular rate and rhythm, without murmurs, rubs, gallops. Lungs: Clear to auscultation bilaterally. Abdomen: Soft, nontender, nondistended, positive bowel sounds. Extremities: No clubbing cyanosis or edema with positive pedal pulses. Neuro: Grossly intact, nonfocal.    Lab Results: Basic Metabolic Panel:  Basename 01/27/12 0341 01/26/12 0500  NA 131* 133*  K 3.9 4.0  CL 102 104  CO2 18* 19  GLUCOSE 58* 47*  BUN 10 13  CREATININE 0.76 0.87  CALCIUM 8.6 8.7  MG -- --  PHOS -- --   Liver Function Tests:  Basename 01/26/12 0500 01/25/12 1446  AST 23 19  ALT 8 7  ALKPHOS 55 60  BILITOT 0.5 0.7  PROT 6.9 7.4  ALBUMIN 3.1* 3.4*    Basename 01/26/12 0500 01/25/12 1446  LIPASE 29 59  AMYLASE -- --   CBC:  Basename 01/26/12 0500 01/25/12 1446  WBC 15.2* 14.0*  NEUTROABS -- 10.6*  HGB 11.2* 11.2*  HCT 35.2* 35.0*  MCV 89.6 86.6  PLT 335 376   CBG:  Basename 01/27/12 0744 01/27/12 0500 01/27/12 0428 01/27/12 0347 01/27/12 0224 01/26/12 2355  GLUCAP 98 83 69* 57* 94 79   Hemoglobin A1C:  Basename 01/26/12 1510  HGBA1C 5.5   Coagulation:  Basename 01/25/12 1457  LABPROT 14.3  INR 1.09   Urinalysis:  Basename 01/25/12 1705    COLORURINE YELLOW  LABSPEC 1.015  PHURINE 8.5*  GLUCOSEU NEGATIVE  HGBUR NEGATIVE  BILIRUBINUR NEGATIVE  KETONESUR NEGATIVE  PROTEINUR NEGATIVE  UROBILINOGEN 1.0  NITRITE NEGATIVE  LEUKOCYTESUR NEGATIVE    Recent Results (from the past 240 hour(s))  MRSA PCR SCREENING     Status: Normal   Collection Time   01/26/12  9:27 AM      Component Value Range Status Comment   MRSA by PCR NEGATIVE  NEGATIVE  Final     Studies/Results: Dg Chest 2 View  01/25/2012  *RADIOLOGY REPORT*  Clinical Data: Shortness of breath, abdominal pain and weakness.  CHEST - 2 VIEW  Comparison: Chest x-ray 11/24/2011.  Findings: No consolidative airspace disease.  No pleural effusions. Mild diffuse interstitial prominence which appears to reflect predominately airway thickening.  No definite suspicious appearing pulmonary nodules or masses.  Heart size is normal.  Extensive calcification of the mitral annulus.  Mediastinal contours are unremarkable.  Atherosclerosis in the thoracic aorta.  IMPRESSION: 1.  Mild diffuse interstitial prominence appears reflect airway thickening, concerning for potential bronchitis. 2.  Atherosclerosis. 3.  Extensive mitral annular calcifications.  Original Report Authenticated By: Florencia Reasons, M.D.    Medications: Scheduled Meds:   . aspirin EC  81 mg Oral Daily  . atorvastatin  20 mg Oral q1800  . dextrose  1 Tube Oral Once  .  dextrose  1 Tube Oral Once  . dextrose      . dextrose      . digoxin  0.125 mg Oral Daily  . diltiazem  30 mg Oral Q6H  . feeding supplement  237 mL Oral BID BM  . HYDROcodone-acetaminophen  1 tablet Oral TID AC  . insulin aspart  0-9 Units Subcutaneous TID WC  . latanoprost  1 drop Both Eyes QHS  . methylphenidate  5 mg Oral BID  . metoprolol  5 mg Intravenous Once  . metoprolol  5 mg Intravenous Once  . pantoprazole  40 mg Oral Q1200  . sertraline  150 mg Oral Daily  . sodium chloride  1,000 mL Intravenous Once  . sodium chloride   1,000 mL Intravenous Once  . sodium chloride  250 mL Intravenous Once  . sodium chloride  250 mL Intravenous Once  . DISCONTD: lisinopril  5 mg Oral q morning - 10a  . DISCONTD: simvastatin  40 mg Oral QPM   Continuous Infusions:   . sodium chloride 10 mL/hr at 01/27/12 0704  . DISCONTD: dextrose    . DISCONTD: dextrose    . DISCONTD: dextrose    . DISCONTD: diltiazem (CARDIZEM) infusion Stopped (01/26/12 1259)   PRN Meds:.ALPRAZolam, dextrose, dextrose, glucagon, glucagon, glucose-Vitamin C, HYDROcodone-acetaminophen, ondansetron (ZOFRAN) IV, ondansetron, DISCONTD: dextrose, DISCONTD: dextrose, DISCONTD: dextrose, DISCONTD: dextrose, DISCONTD: dextrose, DISCONTD: glucagon, DISCONTD: glucagon, DISCONTD: HYDROmorphone  Assessment/Plan:  Principal Problem:  *Abdominal pain, chronic, epigastric Active Problems:  Pancreatitis  HTN (hypertension)  Atrial fibrillation   #1 A Fib with RVR: Continue digoxin. Had not been receiving cardizem because of BP parameters to hold if <120. Will change holding parameters to a SBP<90. DC dilaudid for pain control as also contributing to low BPs.  #2 Pancreatitis: Followed OP by GI. It is noted that Dr. Elnoria Howard had planned on tertiary center referral for pain-management issues. Repeat CT scan actually looks improved, without pseudocyst formation.  #3 Hypoglycemia: resolved. Continue to follow CBGs, eating ok. Not on any CBG-lowering meds at this point.  #4 Leukocytosis: ?source. Follow. CXR with ?bronchitis. Will hold on treatment at this point. U/A negative.  #5 Dispo: Keep in SDU given increased HR with borderline low BP. Once cardizem restarted, if HR and BP improve may transfer to tele bed today.   LOS: 2 days   [facility] Pager: [phone] 01/27/2012, 9:55 AM

## 2012-01-27 NOTE — Progress Notes (Addendum)
Pt showing a progression in pleasant confusion and agitation.  Pt unhooked IV and needed reorienting to day, time, and location.  BP currently 103/65, heart rate 100-120.  Na+ from morning labs 131.  Notified NP Schorr about change in mental status and heart rate 110-120.  No orders at this time. NP to update oncoming MD for [facility] in the morning.  Will continue to monitor.  Patient currently sleeping comfortably.     Cosign--Briany Aye [employer]

## 2012-01-27 NOTE — Progress Notes (Signed)
Called  [name] Harps Acosta,MD regarding HR reaching 130, 141, 139, 127, B/P 110/53. Received one time order for lopressor.

## 2012-01-27 NOTE — Clinical Documentation Improvement (Signed)
MALNUTRITION DOCUMENTATION CLARIFICATION  THIS DOCUMENT IS NOT A PERMANENT PART OF THE MEDICAL RECORD  TO RESPOND TO THE THIS QUERY, FOLLOW THE INSTRUCTIONS BELOW:  1. If needed, update documentation for the patient's encounter via the notes activity.  2. Access this query again and click edit on the In [employer].  3. After updating, or not, click F2 to complete all highlighted (required) fields concerning your review. Select "additional documentation in the medical record" OR "no additional documentation provided".  4. Click Sign note button.  5. The deficiency will fall out of your In Basket *Please let us know if you are not able to complete this workflow by phone or e-mail (listed below).  Please update your documentation within the medical record to reflect your response to this query.                                                                                        01/27/12   Dear Dr.[facility],  In a better effort to capture your patient's severity of illness, reflect appropriate length of stay and utilization of resources, a review of the patient medical record has revealed the following indicators.    Based on your clinical judgment, please clarify and document in a progress note and/or discharge summary the clinical condition associated with the following supporting information:  In responding to this query please exercise your independent judgment.  The fact that a query is asked, does not imply that any particular answer is desired or expected.  01/26/12 Nutrition Assessment noted w/ documentation criteria for " Severe malnutrition in the context of chronic illness ." For acurate Dx specificity & severity please help verify/validate nutrition documentation for cond being eval/mon & tx'd. Thank you   Possible Clinical Conditions? Mild Malnutrition  Moderate Malnutrition Severe Malnutrition   Protein Calorie Malnutrition Severe Protein Calorie  Malnutrition Emaciation  Cachexia   Other Condition Cannot clinically determine   Supporting Information: Risk Factors: see Nutrition Assessment 01/26/12  Signs & Symptoms: see Nutrition Assessment 01/26/12  Diagnostics: see Nutrition Assessment 01/26/12  Treatments: see Nutrition Assessment 01/26/12  Nutrition Consult: see Nutrition Assessment 01/26/12  You may use possible, probable, or suspect with inpatient documentation. possible, probable, suspected diagnoses MUST be documented at the time of discharge  Reviewed:  no additional documentation provided:02/02/12: rev'd for query f/u, no add doc noted to dc summ, closed.orm  Thank You,  Toribio Harbour, RN, BSN, CCDS Certified Clinical Documentation Specialist Pager: [phone]  Health Information Management [place]

## 2012-01-27 NOTE — Progress Notes (Signed)
01/27/12 2230- Pt has been becoming more confused since 1930. Pt is adamant to go home. Husband is going to stay in room with pt tonight. Xanax given at 1935 and decreased anxiety some. Bed alarm turned on, and pt educated on fall risk plan. She tried to swing legs over side rail to get up. Will continue to monitor.

## 2012-01-27 NOTE — Progress Notes (Addendum)
Pt CBG 57 at 0350. Pt asymptomatic, eating peanut butter crackers and drinking 220 ml of regular Coke, slowly. CBG recheck 69 at 0430.  Pt encouraged to continue eating peanut butter crackers and finish the Coke.   Will continue to monitor patient closely.     Co sign--Bruin Bolger [employer]

## 2012-01-27 NOTE — Progress Notes (Addendum)
Pt's BP 84/61 with heart rate around low 100's and afib.  Pt asymptomatic. Parameters for Cardizem PO not met.  Unable to give PO medication.  Paged NP Schorr to update on Pt status.  Ordered bolus and to reevaluate after completion.  Pt BP after bolus 101/54. Paged NP with results, second bolus of ordered at 0100. BP after second bolus was 100/52 at 0145. NP notified of results. Will continue to monitor.     COSIGN- Salem Mastrogiovanni WHITE RN

## 2012-01-28 LAB — GLUCOSE, CAPILLARY
Glucose-Capillary: 184 mg/dL — ABNORMAL HIGH (ref 70–99)
Glucose-Capillary: 158 mg/dL — ABNORMAL HIGH (ref 70–99)

## 2012-01-28 LAB — CBC
Hemoglobin: 9.6 g/dL — ABNORMAL LOW (ref 12.0–15.0)
MCH: 28 pg (ref 26.0–34.0)
Platelets: 308 10*3/uL (ref 150–400)
RBC: 3.43 MIL/uL — ABNORMAL LOW (ref 3.87–5.11)
WBC: 11.7 10*3/uL — ABNORMAL HIGH (ref 4.0–10.5)

## 2012-01-28 LAB — BASIC METABOLIC PANEL
Calcium: 9 mg/dL (ref 8.4–10.5)
GFR calc Af Amer: 79 mL/min — ABNORMAL LOW (ref >90)
GFR calc non Af Amer: 68 mL/min — ABNORMAL LOW (ref >90)
Potassium: 4.2 mEq/L (ref 3.5–5.1)
Sodium: 130 mEq/L — ABNORMAL LOW (ref 135–145)

## 2012-01-28 MED ORDER — METOPROLOL TARTRATE 1 MG/ML IV SOLN
5.0000 mg | Freq: Three times a day (TID) | INTRAVENOUS | Status: DC
  Administered 2012-01-28 – 2012-01-29 (×3): 5 mg via INTRAVENOUS
  Filled 2012-01-28 (×6): qty 5

## 2012-01-28 MED ORDER — DILTIAZEM HCL 60 MG PO TABS
60.0000 mg | ORAL_TABLET | Freq: Four times a day (QID) | ORAL | Status: DC
  Administered 2012-01-28 – 2012-02-01 (×14): 60 mg via ORAL
  Filled 2012-01-28 (×20): qty 1

## 2012-01-28 NOTE — Progress Notes (Signed)
01/28/12 2000- Pt is anxious and verbally aggressive with staff. She is confused and trying to get OOB. I am having to sit in pt's room to keep her safe. [name] updated on situation- he had not been home in 2 days and needs to rest himself. Will continue to monitor pt.

## 2012-01-28 NOTE — Progress Notes (Signed)
Pt agitated, confused & argumentative,  trying to get out of bed. Supported pt while she stood at bedside, HR and B/P remained unchanged. Pt remained agitated and became tearful. Administered 0.5 mg Xanax.

## 2012-01-28 NOTE — Progress Notes (Signed)
Pt's HR 142, experiencing memory of deceased friend, emotionally upset. Gave 0.5 mg Xanax.

## 2012-01-28 NOTE — Progress Notes (Signed)
Subjective: HR still sustaining 120-140s.  Objective: Vital signs in last 24 hours: Temp:  [97.9 F (36.6 C)-98.8 F (37.1 C)] 98 F (36.7 C) (04/25 0800) Pulse Rate:  [78-137] 137  (04/25 1000) Resp:  [11-27] 15  (04/25 1000) BP: (88-129)/(50-88) 129/58 mmHg (04/25 1000) SpO2:  [92 %-98 %] 97 % (04/25 1000) Weight:  [56.5 kg (124 lb 9 oz)] 56.5 kg (124 lb 9 oz) (04/25 0000) Weight change: 1.6 kg (3 lb 8.4 oz) Last BM Date: 01/27/12  Intake/Output from previous day: 04/24 0701 - 04/25 0700 In: 590 [P.O.:360; I.V.:230] Out: 925 [Urine:925]     Physical Exam: General: Alert, awake, oriented x3, in no acute distress. HEENT: No bruits, no goiter. Heart: Regular rate and rhythm, without murmurs, rubs, gallops. Lungs: Clear to auscultation bilaterally. Abdomen: Soft, nontender, nondistended, positive bowel sounds. Extremities: No clubbing cyanosis or edema with positive pedal pulses.    Lab Results: Basic Metabolic Panel:  Basename 01/28/12 0335 01/27/12 0341  NA 130* 131*  K 4.2 3.9  CL 101 102  CO2 20 18*  GLUCOSE 165* 58*  BUN 8 10  CREATININE 0.80 0.76  CALCIUM 9.0 8.6  MG -- --  PHOS -- --   Liver Function Tests:  Basename 01/26/12 0500 01/25/12 1446  AST 23 19  ALT 8 7  ALKPHOS 55 60  BILITOT 0.5 0.7  PROT 6.9 7.4  ALBUMIN 3.1* 3.4*    Basename 01/26/12 0500 01/25/12 1446  LIPASE 29 59  AMYLASE -- --   CBC:  Basename 01/28/12 0335 01/26/12 0500 01/25/12 1446  WBC 11.7* 15.2* --  NEUTROABS -- -- 10.6*  HGB 9.6* 11.2* --  HCT 30.0* 35.2* --  MCV 87.5 89.6 --  PLT 308 335 --   CBG:  Basename 01/28/12 0806 01/27/12 2136 01/27/12 1624 01/27/12 1143 01/27/12 0744 01/27/12 0500  GLUCAP 161* 178* 203* 160* 98 83   Hemoglobin A1C:  Basename 01/26/12 1510  HGBA1C 5.5   Coagulation:  Basename 01/25/12 1457  LABPROT 14.3  INR 1.09   Urinalysis:  Basename 01/25/12 1705  COLORURINE YELLOW  LABSPEC 1.015  PHURINE 8.5*  GLUCOSEU NEGATIVE   HGBUR NEGATIVE  BILIRUBINUR NEGATIVE  KETONESUR NEGATIVE  PROTEINUR NEGATIVE  UROBILINOGEN 1.0  NITRITE NEGATIVE  LEUKOCYTESUR NEGATIVE    Recent Results (from the past 240 hour(s))  MRSA PCR SCREENING     Status: Normal   Collection Time   01/26/12  9:27 AM      Component Value Range Status Comment   MRSA by PCR NEGATIVE  NEGATIVE  Final     Studies/Results: No results found.  Medications: Scheduled Meds:   . aspirin EC  81 mg Oral Daily  . atorvastatin  20 mg Oral q1800  . digoxin  0.125 mg Oral Daily  . diltiazem  60 mg Oral Q6H  . feeding supplement  237 mL Oral BID BM  . HYDROcodone-acetaminophen  1 tablet Oral TID AC  . insulin aspart  0-9 Units Subcutaneous TID WC  . latanoprost  1 drop Both Eyes QHS  . methylphenidate  5 mg Oral BID  . metoprolol  5 mg Intravenous Once  . metoprolol  5 mg Intravenous Q8H  . pantoprazole  40 mg Oral Q1200  . sertraline  150 mg Oral Daily  . sodium chloride  1,000 mL Intravenous Once  . DISCONTD: diltiazem  30 mg Oral Q6H  . DISCONTD: diltiazem  30 mg Oral Q6H   Continuous Infusions:   . sodium chloride  10 mL/hr at 01/27/12 0704   PRN Meds:.ALPRAZolam, dextrose, dextrose, glucagon, glucagon, glucose-Vitamin C, HYDROcodone-acetaminophen, ondansetron (ZOFRAN) IV, ondansetron  Assessment/Plan:  Principal Problem:  *Abdominal pain, chronic, epigastric Active Problems:  Pancreatitis  HTN (hypertension)  Atrial fibrillation   #1 AFIb with RVR: HR still uncontrolled. Will increase cardizem to 60 q 6 and add metoprolol with holding parameters. BP is stable at this point.  #2 Hypoglycemia: resolved.  #3 Pancreatitis: Followed OP by GI. It is noted that Dr. Elnoria Howard had planned on tertiary center referral for pain-management issues. Repeat CT scan actually looks improved, without pseudocyst formation.  #4 Dispo: ok to move to tele floor once bed available.    LOS: 3 days   [facility] Pager:  [phone] 01/28/2012, 10:44 AM

## 2012-01-28 NOTE — Progress Notes (Signed)
[name] brought in depends. Pt has been very concerned and embarrased about her incontinence. Stated this started about 3 months ago.

## 2012-01-29 ENCOUNTER — Inpatient Hospital Stay (HOSPITAL_COMMUNITY): Payer: Medicare Other

## 2012-01-29 LAB — URINALYSIS, ROUTINE W REFLEX MICROSCOPIC
Bilirubin Urine: NEGATIVE
Glucose, UA: NEGATIVE mg/dL
Hgb urine dipstick: NEGATIVE
Specific Gravity, Urine: 1.018 (ref 1.005–1.030)
Urobilinogen, UA: 1 mg/dL (ref 0.0–1.0)
pH: 6 (ref 5.0–8.0)

## 2012-01-29 LAB — BASIC METABOLIC PANEL
Calcium: 9.3 mg/dL (ref 8.4–10.5)
GFR calc non Af Amer: 56 mL/min — ABNORMAL LOW (ref >90)
Glucose, Bld: 112 mg/dL — ABNORMAL HIGH (ref 70–99)
Sodium: 132 mEq/L — ABNORMAL LOW (ref 135–145)

## 2012-01-29 LAB — CBC
MCH: 27.7 pg (ref 26.0–34.0)
Platelets: 374 10*3/uL (ref 150–400)
RBC: 3.58 MIL/uL — ABNORMAL LOW (ref 3.87–5.11)
RDW: 16 % — ABNORMAL HIGH (ref 11.5–15.5)

## 2012-01-29 LAB — GLUCOSE, CAPILLARY: Glucose-Capillary: 166 mg/dL — ABNORMAL HIGH (ref 70–99)

## 2012-01-29 MED ORDER — METOPROLOL TARTRATE 12.5 MG HALF TABLET
12.5000 mg | ORAL_TABLET | Freq: Two times a day (BID) | ORAL | Status: DC
  Administered 2012-01-29 – 2012-02-01 (×6): 12.5 mg via ORAL
  Filled 2012-01-29 (×8): qty 1

## 2012-01-29 NOTE — Progress Notes (Signed)
CARE MANAGEMENT NOTE 01/29/2012  Patient:  [name] [name], [name] [name]   Account Number:  [id number]  Date Initiated:  01/26/2012  Documentation initiated by:  Chardae Mulkern  Subjective/Objective Assessment:   pt admitted with uncontrolled a.fib admitted to sdu with iv cardizem drip     Action/Plan:   lives at home with spouse has her support group   Anticipated DC Date:  02/01/2012   Anticipated DC Plan:  HOME/SELF CARE  In-house referral  NA      DC Planning Services  NA      [facility] Choice  NA   Choice offered to / List presented to:  NA   DME arranged  NA      DME agency  NA     HH arranged  NA      HH agency  NA   Status of service:  In process, will continue to follow Medicare Important Message given?  YES (If response is "NO", the following Medicare IM given date fields will be blank) Date Medicare IM given:  01/25/2012 Date Additional Medicare IM given:    Discharge Disposition:    Per UR Regulation:  Reviewed for med. necessity/level of care/duration of stay  If discussed at Long Length of Stay Meetings, dates discussed:    Comments:  [id number]/patient awaiting transferm out of sdu/icu to reg tele bed on floors/po antiarrthymics being readjusted/a.fib now controlled. [id number] Earlene Plater RN,BSN,CCM Case Management [phone]   [id number]/Jamareon Shimel Lorrin Mais Case Management [phone]

## 2012-01-29 NOTE — Progress Notes (Signed)
Subjective: HR improved to 80s-90s. Is a little more confused today. Spiked a temp of 101.7 this am.  Objective: Vital signs in last 24 hours: Temp:  [98.1 F (36.7 C)-101.7 F (38.7 C)] 101.7 F (38.7 C) (04/26 0800) Pulse Rate:  [57-123] 97  (04/26 0908) Resp:  [12-20] 18  (04/26 0800) BP: (97-129)/(49-74) 129/58 mmHg (04/26 0800) SpO2:  [90 %-98 %] 91 % (04/26 0800) Weight change:  Last BM Date: 01/27/12  Intake/Output from previous day: 04/25 0701 - 04/26 0700 In: 240 [I.V.:240] Out: 725 [Urine:725] Total I/O In: 10 [I.V.:10] Out: 100 [Urine:100]   Physical Exam: General: Alert, awake, oriented x3, in no acute distress. HEENT: No bruits, no goiter. Heart: Regular rate and rhythm, without murmurs, rubs, gallops. Lungs: Clear to auscultation bilaterally. Abdomen: Soft, nontender, nondistended, positive bowel sounds. Extremities: No clubbing cyanosis or edema with positive pedal pulses.    Lab Results: Basic Metabolic Panel:  Basename 01/29/12 0346 01/28/12 0335  NA 132* 130*  K 4.1 4.2  CL 98 101  CO2 23 20  GLUCOSE 112* 165*  BUN 10 8  CREATININE 0.94 0.80  CALCIUM 9.3 9.0  MG -- --  PHOS -- --   CBC:  Basename 01/29/12 0346 01/28/12 0335  WBC 12.9* 11.7*  NEUTROABS -- --  HGB 9.9* 9.6*  HCT 31.0* 30.0*  MCV 86.6 87.5  PLT 374 308   CBG:  Basename 01/29/12 0809 01/28/12 2112 01/28/12 1744 01/28/12 1228 01/28/12 0806 01/27/12 2136  GLUCAP 127* 101* 158* 184* 161* 178*   Hemoglobin A1C:  Basename 01/26/12 1510  HGBA1C 5.5     Recent Results (from the past 240 hour(s))  MRSA PCR SCREENING     Status: Normal   Collection Time   01/26/12  9:27 AM      Component Value Range Status Comment   MRSA by PCR NEGATIVE  NEGATIVE  Final     Studies/Results: No results found.  Medications: Scheduled Meds:    . aspirin EC  81 mg Oral Daily  . atorvastatin  20 mg Oral q1800  . digoxin  0.125 mg Oral Daily  . diltiazem  60 mg Oral Q6H  .  feeding supplement  237 mL Oral BID BM  . HYDROcodone-acetaminophen  1 tablet Oral TID AC  . insulin aspart  0-9 Units Subcutaneous TID WC  . latanoprost  1 drop Both Eyes QHS  . methylphenidate  5 mg Oral BID  . metoprolol tartrate  12.5 mg Oral BID  . pantoprazole  40 mg Oral Q1200  . sertraline  150 mg Oral Daily  . sodium chloride  1,000 mL Intravenous Once  . DISCONTD: diltiazem  30 mg Oral Q6H  . DISCONTD: metoprolol  5 mg Intravenous Q8H   Continuous Infusions:    . sodium chloride 10 mL/hr at 01/27/12 0704   PRN Meds:.ALPRAZolam, dextrose, glucose-Vitamin C, HYDROcodone-acetaminophen, ondansetron (ZOFRAN) IV, ondansetron  Assessment/Plan:  Principal Problem:  *Abdominal pain, chronic, epigastric Active Problems:  Pancreatitis  HTN (hypertension)  Fever  Atrial fibrillation   #1 AFIb with RVR: HR better controlled. Continue cardizem 60 q 6h and change metoprolol to PO with holding parameters.  #2 Fever: concerned for hospital-acquired infections. Check CXR and UA. No antibiotics for now. Will also get blood cultures.  #2 Hypoglycemia: resolved.  #3 Pancreatitis: Followed OP by GI. It is noted that Dr. Elnoria Howard had planned on tertiary center referral for pain-management issues. Repeat CT scan actually looks improved, without pseudocyst formation.  #4 Dispo:  ok to move to tele floor once bed available.    LOS: 4 days   [facility] Pager: [phone] 01/29/2012, 10:11 AM

## 2012-01-30 LAB — GLUCOSE, CAPILLARY
Glucose-Capillary: 173 mg/dL — ABNORMAL HIGH (ref 70–99)
Glucose-Capillary: 140 mg/dL — ABNORMAL HIGH (ref 70–99)

## 2012-01-30 MED ORDER — LEVOFLOXACIN IN D5W 750 MG/150ML IV SOLN
750.0000 mg | INTRAVENOUS | Status: DC
  Administered 2012-01-30 – 2012-02-01 (×2): 750 mg via INTRAVENOUS
  Filled 2012-01-30 (×2): qty 150

## 2012-01-30 MED ORDER — SODIUM CHLORIDE 0.9 % IV SOLN
500.0000 mg | Freq: Two times a day (BID) | INTRAVENOUS | Status: DC
  Administered 2012-01-30 – 2012-02-01 (×5): 500 mg via INTRAVENOUS
  Filled 2012-01-30 (×6): qty 500

## 2012-01-30 MED ORDER — DEXTROSE 5 % IV SOLN
1.0000 g | INTRAVENOUS | Status: DC
  Administered 2012-01-30 – 2012-02-01 (×3): 1 g via INTRAVENOUS
  Filled 2012-01-30 (×3): qty 1

## 2012-01-30 NOTE — Progress Notes (Signed)
Subjective: HR improved to 80s-90s. No further temps. Is very anxious today and crying in bed. Wants to go home. Multiple family members present and updated on plan of care.  Objective: Vital signs in last 24 hours: Temp:  [97.8 F (36.6 C)-99.9 F (37.7 C)] 98.1 F (36.7 C) (04/27 0500) Pulse Rate:  [77-97] 92  (04/27 0500) Resp:  [16-18] 18  (04/27 0500) BP: (100-106)/(49-64) 100/64 mmHg (04/27 0500) SpO2:  [82 %-98 %] 92 % (04/27 0500) Weight:  [57.879 kg (127 lb 9.6 oz)] 57.879 kg (127 lb 9.6 oz) (04/26 1531) Weight change:  Last BM Date: 01/27/12  Intake/Output from previous day: 04/26 0701 - 04/27 0700 In: 777 [P.O.:477; I.V.:300] Out: 750 [Urine:750]     Physical Exam: General: Alert, awake, oriented x3, in no acute distress. HEENT: No bruits, no goiter. Heart: Regular rate and rhythm, without murmurs, rubs, gallops. Lungs: Clear to auscultation bilaterally. Abdomen: Soft, nontender, nondistended, positive bowel sounds. Extremities: No clubbing cyanosis or edema with positive pedal pulses.    Lab Results: Basic Metabolic Panel:  Basename 01/29/12 0346 01/28/12 0335  NA 132* 130*  K 4.1 4.2  CL 98 101  CO2 23 20  GLUCOSE 112* 165*  BUN 10 8  CREATININE 0.94 0.80  CALCIUM 9.3 9.0  MG -- --  PHOS -- --   CBC:  Basename 01/29/12 0346 01/28/12 0335  WBC 12.9* 11.7*  NEUTROABS -- --  HGB 9.9* 9.6*  HCT 31.0* 30.0*  MCV 86.6 87.5  PLT 374 308   CBG:  Basename 01/30/12 0808 01/29/12 2231 01/29/12 1655 01/29/12 1140 01/29/12 0809 01/28/12 2112  GLUCAP 140* 173* 166* 207* 127* 101*    Recent Results (from the past 240 hour(s))  MRSA PCR SCREENING     Status: Normal   Collection Time   01/26/12  9:27 AM      Component Value Range Status Comment   MRSA by PCR NEGATIVE  NEGATIVE  Final     Studies/Results: Dg Chest Port 1 View  01/29/2012  *RADIOLOGY REPORT*  Clinical Data: Fever.  PORTABLE CHEST - 1 VIEW  Comparison: 01/25/2012  Findings: 1034  hours. Interstitial markings are diffusely coarsened with chronic features.  New hazy airspace disease is seen in the right infrahilar region. Cardiopericardial silhouette is at upper limits of normal for size. Telemetry leads overlie the chest.  IMPRESSION: New right infrahilar airspace disease, suspicious for pneumonia.  Original Report Authenticated By: ERIC A. MANSELL, M.D.    Medications: Scheduled Meds:    . aspirin EC  81 mg Oral Daily  . atorvastatin  20 mg Oral q1800  . ceFEPime (MAXIPIME) IV  1 g Intravenous Q24H  . digoxin  0.125 mg Oral Daily  . diltiazem  60 mg Oral Q6H  . feeding supplement  237 mL Oral BID BM  . HYDROcodone-acetaminophen  1 tablet Oral TID AC  . insulin aspart  0-9 Units Subcutaneous TID WC  . latanoprost  1 drop Both Eyes QHS  . levofloxacin (LEVAQUIN) IV  750 mg Intravenous Q48H  . methylphenidate  5 mg Oral BID  . metoprolol tartrate  12.5 mg Oral BID  . pantoprazole  40 mg Oral Q1200  . sertraline  150 mg Oral Daily  . sodium chloride  1,000 mL Intravenous Once  . vancomycin  500 mg Intravenous Q12H   Continuous Infusions:    . sodium chloride 10 mL/hr at 01/30/12 0400   PRN Meds:.ALPRAZolam, dextrose, glucose-Vitamin C, HYDROcodone-acetaminophen, ondansetron (ZOFRAN) IV, ondansetron  Assessment/Plan:  Principal Problem:  *Abdominal pain, chronic, epigastric Active Problems:  Pancreatitis  HTN (hypertension)  Fever  HCAP (healthcare-associated pneumonia)  Atrial fibrillation   #1 AFIb with RVR: HR better controlled. Continue cardizem 60 q 6h and metoprolol.  #2 HCAP: started on vanc/cefepime/levaquin today. Would recommend at least 3 days of IV antibiotics.  #3 Hypoglycemia: resolved.  #4 Pancreatitis: Followed OP by GI. It is noted that Dr. Elnoria Howard had planned on tertiary center referral for pain-management issues. Repeat CT scan actually looks improved, without pseudocyst formation.  #4 Dispo: Continue broad spectrum antibiotics,  encourage mobilization. Discussed with RN, PT eval ordered yesterday, but has not been seen yet.    LOS: 5 days   [facility] Pager: [phone] 01/30/2012, 10:44 AM

## 2012-01-30 NOTE — Progress Notes (Signed)
Patient complaint of nausea  Gave Zofran

## 2012-01-30 NOTE — Progress Notes (Signed)
ANTIBIOTIC CONSULT NOTE - INITIAL  Pharmacy Consult for Vancomycin, Levaquin, Cefepime Indication: pneumonia  Allergies  Allergen Reactions  . Lactose Intolerance (Gi) Nausea And Vomiting    severe stomach pain  . Penicillins Hives and Itching    "haven't used any since 1970's"  . Sulfa Antibiotics Hives and Itching    "haven't used any since 1970's"    Patient Measurements: Height: 5' (152.4 cm) Weight: 127 lb 9.6 oz (57.879 kg) IBW/kg (Calculated) : 45.5   Labs:  Basename 01/29/12 0346 01/28/12 0335  WBC 12.9* 11.7*  HGB 9.9* 9.6*  PLT 374 308  LABCREA -- --  CREATININE 0.94 0.80   Estimated Creatinine Clearance: 38.7 ml/min (by C-G formula based on Cr of 0.94). Normalized CrCl 64ml/min/1.73m2   Microbiology: Recent Results (from the past 720 hour(s))  MRSA PCR SCREENING     Status: Normal   Collection Time   01/26/12  9:27 AM      Component Value Range Status Comment   MRSA by PCR NEGATIVE  NEGATIVE  Final     Medical History: Past Medical History  Diagnosis Date  . Pancreatitis     ~2008 and again in 11/2011 s/p ERCP  . Hypertension   . Glaucoma   . High cholesterol   . GERD (gastroesophageal reflux disease)   . H/O hiatal hernia   . Atrial fibrillation     Coumadin  . Dizziness - light-headed   . Pneumonia 12/2011    "first time I know about"  . Shortness of breath on exertion   . Type II diabetes mellitus   . Depression   . Chronic lower back pain     Medications:  Anti-infectives     Start     Dose/Rate Route Frequency Ordered Stop   01/30/12 1200   vancomycin (VANCOCIN) 500 mg in sodium chloride 0.9 % 100 mL IVPB        500 mg 100 mL/hr over 60 Minutes Intravenous Every 12 hours 01/30/12 0904     01/30/12 1100   ceFEPIme (MAXIPIME) 1 g in dextrose 5 % 50 mL IVPB        1 g 100 mL/hr over 30 Minutes Intravenous Every 24 hours 01/30/12 0903     01/30/12 1000   levofloxacin (LEVAQUIN) IVPB 750 mg        750 mg 100 mL/hr over 90 Minutes  Intravenous Every 48 hours 01/30/12 0859           Assessment: [name] [name] admitted 4/22 w/ abd pain. Tm 101.7 yesterday morning, CXR suspicious for PNA. Pharmacy asked to dose Vancomycin, Levaquin and Cefepime. Pt w/ reported PCN allergy, rash/itching - will monitor (cephalosporins w/ < 5% cross rx). Has received Imipenem in the past without incident. Bcx x 2 4/26 in process.  Goal of Therapy:  Vancomycin trough level 15-20 mcg/ml Appropriate renal dose of Cefepime and Levaquin for PNA  Plan:   Levaquin 750mg  IV q48h  Cefepime 1g IV q24h  Vancomycin 500mg  IV q12h.   Follow Scr, vitals, cx  Vancomycin tr at Css  Adjust doses as appropriate.  Gwen Her PharmD  [phone] 01/30/2012 9:11 AM

## 2012-01-30 NOTE — Evaluation (Signed)
Occupational Therapy Evaluation Patient Details Name: [name] [name] MRN: [id number] DOB: [birth year] Today's Date: 01/30/2012 Time: 1125-1202 OT Time Calculation (min): 37 min  OT Assessment / Plan / Recommendation Clinical Impression  This 78 yo female admitted with epigastric abdominal pain for 2 months worse over the last 3 days and h/o pancreatitis presents to acute OT with problems below thus affecting pt's ability to care for herself to any degree. Will benefit from acute OTwith follow-up at SNF to get to where she is more independent so as to decrease burden of care.    OT Assessment  Patient needs continued OT Services    Follow Up Recommendations  Skilled nursing facility    Equipment Recommendations  Defer to next venue    Frequency Min 2X/week    Precautions / Restrictions Precautions Precautions: Fall Restrictions Weight Bearing Restrictions: No   Pertinent Vitals/Pain 8/10 left sided pain    ADL  Eating/Feeding: Simulated;Supervision/safety;Set up Where Assessed - Eating/Feeding: Bed level Grooming: Simulated;Minimal assistance Where Assessed - Grooming: Supported sitting Upper Body Bathing: Simulated;Minimal assistance Where Assessed - Upper Body Bathing: Supported;Sitting, chair Lower Body Bathing: Simulated;Maximal assistance Where Assessed - Lower Body Bathing: Supported;Sit to stand from chair Upper Body Dressing: Simulated;Maximal assistance Where Assessed - Upper Body Dressing: Supported;Sitting, chair Lower Body Dressing: Simulated;+1 Total assistance Where Assessed - Lower Body Dressing: Supported;Sit to stand from chair Toilet Transfer: Performed;Minimal assistance Toilet Transfer Method: Stand pivot Toilet Transfer Equipment: Bedside commode Toileting - Clothing Manipulation: Simulated;Moderate assistance (gown) Where Assessed - Toileting Clothing Manipulation: Standing Toileting - Hygiene: Simulated;+1 Total assistance Where Assessed -  Toileting Hygiene: Standing Tub/Shower Transfer: Not assessed Tub/Shower Transfer Method: Not assessed Equipment Used: Rolling walker (3-n-1) Ambulation Related to ADLs: Min A with RW    OT Goals Acute Rehab OT Goals OT Goal Formulation: With patient Time For Goal Achievement: 02/13/12 Potential to Achieve Goals: Fair ADL Goals Pt Will Perform Grooming: with set-up;with supervision;Supported;Standing at sink (2 tasks, min VCs) ADL Goal: Grooming - Progress: Goal set today Pt Will Perform Upper Body Bathing: with set-up;with supervision;Unsupported;Sitting, edge of bed;Sitting, chair (min VCs) ADL Goal: Upper Body Bathing - Progress: Goal set today Pt Will Perform Lower Body Bathing: with mod assist;Supported;Sit to stand from chair (min VCs) ADL Goal: Lower Body Bathing - Progress: Goal set today Pt Will Perform Upper Body Dressing: with min assist;Unsupported;Sitting, bed;Sitting, chair (min VCs) ADL Goal: Upper Body Dressing - Progress: Goal set today Pt Will Perform Lower Body Dressing: with mod assist;Supported;Sit to stand from bed;Sit to stand from chair (min VCs) ADL Goal: Lower Body Dressing - Progress: Goal set today Pt Will Transfer to Toilet: Ambulation;with DME;Comfort height toilet;Grab bars (min guard A) ADL Goal: Toilet Transfer - Progress: Goal set today Pt Will Perform Toileting - Clothing Manipulation: with supervision;Standing ADL Goal: Toileting - Clothing Manipulation - Progress: Goal set today Pt Will Perform Toileting - Hygiene: with min assist;Sit to stand from 3-in-1/toilet ADL Goal: Toileting - Hygiene - Progress: Goal set today Additional ADL Goal #1: Pt will be able to get to EOB with use of rail and HOB 10 degrees or less as a precursor to BADLs and transfers ADL Goal: Additional Goal #1 - Progress: Goal set today  Visit Information  Last OT Received On: 01/30/12 Assistance Needed: +1    Subjective Data  Subjective: What is the difference of sitting up  in the chair versus laying in bed Patient Stated Goal: "I want to go to heaven"   Prior Functioning  Home Living Lives With: Spouse Available Help at Discharge: Family;Available 24 hours/day (however her husband is getting worn out) Type of Home: House Home Access: Ramped entrance Home Layout: One level Bathroom Shower/Tub: Walk-in Contractor: Standard Bathroom Accessibility: Yes How Accessible: Accessible via walker Home Adaptive Equipment: Shower chair with back;Grab bars in shower;Bedside commode/3-in-1;Walker - rolling Prior Function Level of Independence: Needs assistance Needs Assistance: Bathing;Dressing;Toileting;Meal Prep;Light Housekeeping Bath: Maximal Dressing: Maximal Toileting: Maximal Meal Prep: Total Light Housekeeping: Total Driving: No Vocation: Retired Musician: No difficulties Dominant Hand: Right    Cognition  Overall Cognitive Status: Impaired Area of Impairment: Attention (pt stated that her mind is not right) Arousal/Alertness: Lethargic Behavior During Session: Anxious Current Attention Level: Sustained    Extremity/Trunk Assessment Right Upper Extremity Assessment RUE ROM/Strength/Tone: Within functional levels Left Upper Extremity Assessment LUE ROM/Strength/Tone: Within functional levels   Mobility Bed Mobility Bed Mobility: Supine to Sit;Sitting - Scoot to Edge of Bed Supine to Sit: 4: Min guard;With rails;HOB elevated (30 degrees) Sitting - Scoot to Edge of Bed: 5: Supervision Transfers Transfers: Sit to Stand;Stand to Sit Sit to Stand: 4: Min assist;With upper extremity assist;From bed Stand to Sit: 4: Min assist;With armrests;With upper extremity assist;To chair/3-in-1   Exercise    Balance    End of Session OT - End of Session Equipment Utilized During Treatment:  (RW, 3-n-1) Activity Tolerance: Patient limited by pain Patient left: in chair;with family/visitor present (family aware to let  nursing know if they leave) Nurse Communication: Mobility status (+1 A)    [name] [name] [phone] 01/30/2012, 1:01 PM

## 2012-01-31 LAB — BASIC METABOLIC PANEL
CO2: 20 mEq/L (ref 19–32)
Calcium: 9.2 mg/dL (ref 8.4–10.5)
Chloride: 99 mEq/L (ref 96–112)
Glucose, Bld: 136 mg/dL — ABNORMAL HIGH (ref 70–99)
Potassium: 4.4 mEq/L (ref 3.5–5.1)
Sodium: 130 mEq/L — ABNORMAL LOW (ref 135–145)

## 2012-01-31 LAB — CBC
HCT: 30.5 % — ABNORMAL LOW (ref 36.0–46.0)
Hemoglobin: 9.7 g/dL — ABNORMAL LOW (ref 12.0–15.0)
MCH: 27.7 pg (ref 26.0–34.0)
MCV: 87.1 fL (ref 78.0–100.0)
Platelets: 419 10*3/uL — ABNORMAL HIGH (ref 150–400)
RBC: 3.5 MIL/uL — ABNORMAL LOW (ref 3.87–5.11)
WBC: 13.9 10*3/uL — ABNORMAL HIGH (ref 4.0–10.5)

## 2012-01-31 LAB — GLUCOSE, CAPILLARY
Glucose-Capillary: 124 mg/dL — ABNORMAL HIGH (ref 70–99)
Glucose-Capillary: 136 mg/dL — ABNORMAL HIGH (ref 70–99)
Glucose-Capillary: 133 mg/dL — ABNORMAL HIGH (ref 70–99)

## 2012-01-31 NOTE — Progress Notes (Signed)
Subjective: HR improved to 80s-90s. No further temps.  Currently sleeping. No family members are present.  Objective: Vital signs in last 24 hours: Temp:  [98.1 F (36.7 C)-99.2 F (37.3 C)] 98.1 F (36.7 C) (04/28 0447) Pulse Rate:  [78-111] 111  (04/28 0937) Resp:  [18] 18  (04/28 0447) BP: (101-108)/(58-65) 101/58 mmHg (04/28 1223) SpO2:  [95 %-96 %] 95 % (04/28 0447) Weight change:  Last BM Date: 01/28/12  Intake/Output from previous day: 04/27 0701 - 04/28 0700 In: 802 [P.O.:360; I.V.:140; IV Piggyback:302] Out: 1025 [Urine:1025]     Physical Exam: Deferred as patient sleeping. Will attempt to see again later today.    Lab Results: Basic Metabolic Panel:  Basename 01/31/12 0500 01/29/12 0346  NA 130* 132*  K 4.4 4.1  CL 99 98  CO2 20 23  GLUCOSE 136* 112*  BUN 12 10  CREATININE 0.85 0.94  CALCIUM 9.2 9.3  MG -- --  PHOS -- --   CBC:  Basename 01/31/12 0500 01/29/12 0346  WBC 13.9* 12.9*  NEUTROABS -- --  HGB 9.7* 9.9*  HCT 30.5* 31.0*  MCV 87.1 86.6  PLT 419* 374   CBG:  Basename 01/31/12 1248 01/31/12 0758 01/30/12 2154 01/30/12 1723 01/30/12 1157 01/30/12 0808  GLUCAP 158* 136* 124* 121* 192* 140*    Recent Results (from the past 240 hour(s))  MRSA PCR SCREENING     Status: Normal   Collection Time   01/26/12  9:27 AM      Component Value Range Status Comment   MRSA by PCR NEGATIVE  NEGATIVE  Final   CULTURE, BLOOD (ROUTINE X 2)     Status: Normal (Preliminary result)   Collection Time   01/29/12  1:20 PM      Component Value Range Status Comment   Specimen Description Blood   Final    Special Requests NONE   Final    Culture  Setup Time [id number]   Final    Culture     Final    Value:        BLOOD CULTURE RECEIVED NO GROWTH TO DATE CULTURE WILL BE HELD FOR 5 DAYS BEFORE ISSUING A FINAL NEGATIVE REPORT   Report Status PENDING   Incomplete   CULTURE, BLOOD (ROUTINE X 2)     Status: Normal (Preliminary result)   Collection Time   01/29/12  1:25 PM      Component Value Range Status Comment   Specimen Description Blood   Final    Special Requests NONE   Final    Culture  Setup Time [id number]   Final    Culture     Final    Value:        BLOOD CULTURE RECEIVED NO GROWTH TO DATE CULTURE WILL BE HELD FOR 5 DAYS BEFORE ISSUING A FINAL NEGATIVE REPORT   Report Status PENDING   Incomplete     Studies/Results: No results found.  Medications: Scheduled Meds:    . aspirin EC  81 mg Oral Daily  . atorvastatin  20 mg Oral q1800  . ceFEPime (MAXIPIME) IV  1 g Intravenous Q24H  . digoxin  0.125 mg Oral Daily  . diltiazem  60 mg Oral Q6H  . feeding supplement  237 mL Oral BID BM  . HYDROcodone-acetaminophen  1 tablet Oral TID AC  . insulin aspart  0-9 Units Subcutaneous TID WC  . latanoprost  1 drop Both Eyes QHS  . levofloxacin (LEVAQUIN) IV  750 mg Intravenous  Q48H  . methylphenidate  5 mg Oral BID  . metoprolol tartrate  12.5 mg Oral BID  . pantoprazole  40 mg Oral Q1200  . sertraline  150 mg Oral Daily  . sodium chloride  1,000 mL Intravenous Once  . vancomycin  500 mg Intravenous Q12H   Continuous Infusions:    . sodium chloride 10 mL/hr at 01/30/12 1800   PRN Meds:.ALPRAZolam, dextrose, glucose-Vitamin C, HYDROcodone-acetaminophen, ondansetron (ZOFRAN) IV, ondansetron  Assessment/Plan:  Principal Problem:  *Abdominal pain, chronic, epigastric Active Problems:  Pancreatitis  HTN (hypertension)  Fever  HCAP (healthcare-associated pneumonia)  Atrial fibrillation   #1 AFIb with RVR: HR better controlled. Continue cardizem 60 q 6h and metoprolol.  #2 HCAP:  vanc/cefepime/levaquin day #2. Would recommend at least 3 days of IV antibiotics.  #3 Hypoglycemia: resolved.  #4 Pancreatitis: Followed OP by GI. It is noted that Dr. Elnoria Howard had planned on tertiary center referral for pain-management issues. Repeat CT scan actually looks improved, without pseudocyst formation.  #4 Dispo: Continue broad  spectrum antibiotics, encourage mobilization. PT/OT recommends SNF. Have d/w social work that she will need placement. Patient is reluctant to go anywhere but home. Her husband is supportive of ST-SNF if deemed necessary.    LOS: 6 days   [facility] Pager: [phone] 01/31/2012, 1:29 PM

## 2012-01-31 NOTE — Progress Notes (Signed)
Clinical Social Work Department BRIEF PSYCHOSOCIAL ASSESSMENT 01/31/2012  Patient:  MALEIGH, BAGOT     Account Number:  [id number]     Admit date:  01/25/2012  Clinical Social Worker:  Orpah Greek  Date/Time:  01/31/2012 02:25 PM  Referred by:  Physician  Date Referred:  01/31/2012 Referred for  SNF Placement   Other Referral:   Interview type:  Patient Other interview type:    PSYCHOSOCIAL DATA Living Status:  HUSBAND Admitted from facility:   Level of care:   Primary support name:  Roque Lias Primary support relationship to patient:  SPOUSE Degree of support available:    CURRENT CONCERNS Current Concerns  Post-Acute Placement   Other Concerns:    SOCIAL WORK ASSESSMENT / PLAN CSW spoke with pt regarding SNF placement. Pt had been living with husband priuor to hospitalization. CSW gave pt information about SNF facilities available.   Assessment/plan status:  Information/Referral to [facility] Other assessment/ plan:   Information/referral to community resources:   CSW cpompleted FL2 and faxed out to [employer]. CSW will f/u with bed offers.    PATIENT'S/FAMILY'S RESPONSE TO PLAN OF CARE:       Unice Bailey, [place] [phone]

## 2012-01-31 NOTE — Evaluation (Signed)
Physical Therapy Evaluation Patient Details Name: [name] [name] MRN: [id number] DOB: Sep 25, 1930 Today's Date: 01/31/2012 Time: 4098-1191 PT Time Calculation (min): 27 min  PT Assessment / Plan / Recommendation Clinical Impression  Patient is a 78 y/o female admitted with epigastric abdominal pain for 2 months worse over the last 3 days and h/o pancreatitis.  She presents with decreased independence with mobility with decreased strength, decreased balance and limited activity tolerance and will benefit from skilled PT in the acute setting to maximize independence and allow d/c home with spouse after STSNF stay to improve independence.    PT Assessment  Patient needs continued PT services    Follow Up Recommendations  Skilled nursing facility    Equipment Recommendations  Defer to next venue    Frequency Min 3X/week    Precautions / Restrictions Precautions Precautions: Fall   Pertinent Vitals/Pain Denies pain      Mobility  Bed Mobility Bed Mobility: Supine to Sit;Sit to Supine Supine to Sit: 3: Mod assist Sitting - Scoot to Edge of Bed: 4: Min assist Sit to Supine: 3: Mod assist Details for Bed Mobility Assistance: needed encouragement to get up and participate; assist given to improve cooperation Transfers Transfers: Sit to Stand;Stand to Sit Sit to Stand: 4: Min assist;With upper extremity assist;From bed Stand to Sit: 4: Min assist;To bed Ambulation/Gait Ambulation/Gait Assistance: 4: Min assist Ambulation Distance (Feet): 90 Feet Assistive device: Rolling walker Ambulation/Gait Assistance Details: cues for proximity to walker, assist for turns and for weakness at end of ambulation Gait Pattern: Step-through pattern;Decreased stride length    Exercises     PT Goals Acute Rehab PT Goals PT Goal Formulation: With patient Time For Goal Achievement: 02/14/12 Potential to Achieve Goals: Good Pt will go Supine/Side to Sit: with supervision PT Goal: Supine/Side  to Sit - Progress: Goal set today Pt will go Sit to Supine/Side: with supervision PT Goal: Sit to Supine/Side - Progress: Goal set today Pt will go Sit to Stand: with supervision PT Goal: Sit to Stand - Progress: Goal set today Pt will go Stand to Sit: with supervision PT Goal: Stand to Sit - Progress: Goal set today Pt will Ambulate: >150 feet;with least restrictive assistive device;with supervision PT Goal: Ambulate - Progress: Goal set today  Visit Information  Last PT Received On: 01/31/12 Assistance Needed: +1    Subjective Data  Subjective: My legs are weak and I can't stand or walk by myself. Patient Stated Goal: To go to rehab   Prior Functioning  Home Living Lives With: Spouse Available Help at Discharge: Family Type of Home: House Home Access: Ramped entrance Home Layout: One level Bathroom Shower/Tub: Health visitor: Standard Home Adaptive Equipment: Environmental consultant - four wheeled;Bedside commode/3-in-1;Grab bars in shower;Shower chair with back Prior Function Level of Independence: Needs assistance;Independent with assistive device(s) Vocation: Retired Comments: independent for gait with walker; needed assist for all ADL's Communication Communication: No difficulties    Cognition  Overall Cognitive Status: Impaired Area of Impairment: Attention Arousal/Alertness: Awake/alert Behavior During Session: Anxious (tearful at times) Current Attention Level: Sustained Attention - Other Comments: perseverates on feeling disempowerd; on people making decisions for her    Extremity/Trunk Assessment Right Lower Extremity Assessment RLE ROM/Strength/Tone: Deficits RLE ROM/Strength/Tone Deficits: reports right leg giving away at end of ambulation Left Lower Extremity Assessment LLE ROM/Strength/Tone: [facility] for tasks assessed   Balance    End of Session PT - End of Session Equipment Utilized During Treatment: Gait belt Activity Tolerance: Patient limited by  fatigue Patient left: in bed;with call bell/phone within reach;with family/visitor present;with bed alarm set   [facility] 01/31/2012, 1:21 PM

## 2012-01-31 NOTE — Progress Notes (Signed)
Clinical Social Work Department CLINICAL SOCIAL WORK PLACEMENT NOTE 01/31/2012  Patient:  [name], [name]  Account Number:  [id number] Admit date:  01/25/2012  Clinical Social Worker:  Orpah Greek  Date/time:  01/31/2012 02:31 PM  Clinical Social Work is seeking post-discharge placement for this patient at the following level of care:   SKILLED NURSING   (*CSW will update this form in Epic as items are completed)   01/31/2012  Patient/family provided with [facility] Department of Clinical Social Work's list of facilities offering this level of care within the geographic area requested by the patient (or if unable, by the patient's family).  01/31/2012  Patient/family informed of their freedom to choose among providers that offer the needed level of care, that participate in Medicare, Medicaid or managed care program needed by the patient, have an available bed and are willing to accept the patient.  01/31/2012  Patient/family informed of [facility]' ownership interest in [facility], as well as of the fact that they are under no obligation to receive care at this facility.  PASARR submitted to EDS on  PASARR number received from EDS on   FL2 transmitted to all facilities in geographic area requested by pt/family on  01/31/2012 FL2 transmitted to all facilities within larger geographic area on   Patient informed that his/her managed care company has contracts with or will negotiate with  certain facilities, including the following:     Patient/family informed of bed offers received:   Patient chooses bed at  Physician recommends and patient chooses bed at    Patient to be transferred to  on   Patient to be transferred to facility by   The following physician request were entered in Epic:   Additional Comments:  Unice Bailey, [employer] [phone]

## 2012-02-01 LAB — BASIC METABOLIC PANEL
CO2: 24 mEq/L (ref 19–32)
Calcium: 9.2 mg/dL (ref 8.4–10.5)
Creatinine, Ser: 0.87 mg/dL (ref 0.50–1.10)
GFR calc non Af Amer: 62 mL/min — ABNORMAL LOW (ref >90)

## 2012-02-01 LAB — CBC
MCH: 28 pg (ref 26.0–34.0)
MCV: 86.8 fL (ref 78.0–100.0)
Platelets: 532 10*3/uL — ABNORMAL HIGH (ref 150–400)
RDW: 16.2 % — ABNORMAL HIGH (ref 11.5–15.5)
WBC: 10.9 10*3/uL — ABNORMAL HIGH (ref 4.0–10.5)

## 2012-02-01 MED ORDER — LEVOFLOXACIN 750 MG PO TABS: 750.0000 mg | ORAL_TABLET | Freq: Every day | ORAL | Status: AC

## 2012-02-01 MED ORDER — DIGOXIN 125 MCG PO TABS: 0.1250 mg | ORAL_TABLET | Freq: Every day | ORAL | Status: DC

## 2012-02-01 MED ORDER — METOPROLOL TARTRATE 12.5 MG HALF TABLET: 12.5000 mg | ORAL_TABLET | Freq: Two times a day (BID) | ORAL | Status: DC

## 2012-02-01 MED ORDER — DILTIAZEM HCL ER COATED BEADS 240 MG PO CP24: 240.0000 mg | ORAL_CAPSULE | Freq: Every day | ORAL | Status: DC

## 2012-02-01 NOTE — Discharge Summary (Signed)
Physician Discharge Summary  Patient ID: [name] [name] MRN: [id number] DOB/AGE: Aug 10, 1930 78 y.o.  Admit date: 01/25/2012 Discharge date: 02/01/2012  Primary Care Physician:  Cecile Hearing, MD, MD   Discharge Diagnoses:    Principal Problem:  *Abdominal pain, chronic, epigastric Active Problems:  Pancreatitis  HTN (hypertension)  Fever  HCAP (healthcare-associated pneumonia)  Atrial fibrillation    Medication List  As of 02/01/2012 12:28 PM   STOP taking these medications         acetaminophen 500 MG tablet      HYDROcodone-acetaminophen 5-325 MG per tablet      lisinopril 5 MG tablet         TAKE these medications         ALPRAZolam 1 MG tablet   Commonly known as: XANAX   Take 0.5 mg by mouth 4 (four) times daily as needed. Sleep/anxiety.      aspirin EC 81 MG tablet   Take 81 mg by mouth daily.      CENTRUM SILVER PO   Take 1 tablet by mouth daily.      cyproheptadine 4 MG tablet   Commonly known as: PERIACTIN   Take 4 mg by mouth 3 (three) times daily as needed. For apetite      digoxin 0.125 MG tablet   Commonly known as: LANOXIN   Take 1 tablet (0.125 mg total) by mouth daily.      diltiazem 240 MG 24 hr capsule   Commonly known as: CARDIZEM CD   Take 1 capsule (240 mg total) by mouth daily.      fenofibrate 160 MG tablet   Take 160 mg by mouth daily.      glimepiride 4 MG tablet   Commonly known as: AMARYL   Take 2-4 mg by mouth See admin instructions. Take as needed as directed based on blood sugar.      hyoscyamine 0.125 MG tablet   Commonly known as: LEVSIN, ANASPAZ   Take 0.125 mg by mouth every 4 (four) hours as needed. For abdominal pain      levofloxacin 750 MG tablet   Commonly known as: LEVAQUIN   Take 1 tablet (750 mg total) by mouth daily.      methylphenidate 5 MG tablet   Commonly known as: RITALIN   Take 5 mg by mouth 2 (two) times daily. At 9am and lunchtime      metoprolol tartrate 12.5 mg Tabs   Commonly known  as: LOPRESSOR   Take 0.5 tablets (12.5 mg total) by mouth 2 (two) times daily.      omeprazole 20 MG capsule   Commonly known as: PRILOSEC   Take 20 mg by mouth 2 (two) times daily.      sertraline 100 MG tablet   Commonly known as: ZOLOFT   Take 150 mg by mouth daily.      simvastatin 40 MG tablet   Commonly known as: ZOCOR   Take 40 mg by mouth every evening.      XALATAN OP   Place 1 drop into both eyes at bedtime.             Disposition and Follow-up:  Will be discharged to SNF in stable and improved condition.  Consults:  None    Significant Diagnostic Studies:  Dg Chest 2 View  01/25/2012  *RADIOLOGY REPORT*  Clinical Data: Shortness of breath, abdominal pain and weakness.  CHEST - 2 VIEW  Comparison: Chest x-ray 11/24/2011.  Findings: No consolidative  airspace disease.  No pleural effusions. Mild diffuse interstitial prominence which appears to reflect predominately airway thickening.  No definite suspicious appearing pulmonary nodules or masses.  Heart size is normal.  Extensive calcification of the mitral annulus.  Mediastinal contours are unremarkable.  Atherosclerosis in the thoracic aorta.  IMPRESSION: 1.  Mild diffuse interstitial prominence appears reflect airway thickening, concerning for potential bronchitis. 2.  Atherosclerosis. 3.  Extensive mitral annular calcifications.  Original Report Authenticated By: Florencia Reasons, M.D.   Brief H and P: For complete details please refer to admission H and P, but in brief 78 year old female who has been patient issues of the pancreas for over 2 months now. She is status post cholecystectomy and was recently discharged from here after having an ERCP done by GI which was unrevealing as to the cause of her recurrent pancreatitis. She was discharged and advised to seek a second opinion at a tertiary care center. She had an appointment with GI in the last week or so and was actually having a very good day and it was decided  for her not to make that referral. However last 3-4 days she's had progressive worsening epigastric abdominal pain without any significant nausea vomiting or diarrhea and has not been able to tolerate much by mouth. She is on Norco at home for which she takes only one maybe 2 pills a day for her pain. Her and her husband are very rarely about taking pain medications as they are afraid she is going to become addicted to the Norco. Her pain level averages between 6 and 8 every single day and when she takes one Norco and brings it down to about a 4. So the pain is not well controlled at home. She is here today because of uncontrolled abdominal pain. Worsening abdominal pain she is still maybe only taking 2 pain pills a day. When she was asked by she is very reluctant to take pain pills because she is afraid she is going to get addicted to it. She denied any fevers. We were asked to admit her for further evaluation and management.      Hospital Course:  Principal Problem:  *Abdominal pain, chronic, epigastric Active Problems:  Pancreatitis  HTN (hypertension)  Fever  HCAP (healthcare-associated pneumonia)  Atrial fibrillation   #1 A Fib with RVR: HR now controlled on cardizem and metoprolol. Not chronically anticoagulated.  #2 HCAP: completed 4 days of broad spectrum antibiotic therapy. Has not has any further fevers or leukocytosis. Will narrow to PO levaquin for an additional 7 days.  #3 Pancreatitis: Followed OP by GI. It is noted that Dr. Elnoria Howard had planned on tertiary center referral for pain-management issues. Repeat CT scan actually looks improved, without pseudocyst formation.  #4 Dispo: Plan is for ST-SNF DC today for rehabilitation purposes. Patient is very anxious to return back home.   Time spent on Discharge: Greater than 30 minutes.  SignedChaya Jan [facility] Pager: [phone] 02/01/2012, 12:28 PM

## 2012-02-01 NOTE — Progress Notes (Signed)
Patient set to discharge to [facility] [place]. Family at bedside aware, husband is at facility completing admission paperwork. [facility] called for transport.   Unice Bailey, [place] [phone]  Clinical Social Work Department CLINICAL SOCIAL WORK PLACEMENT NOTE 02/01/2012  Patient:  [name] [name], [name] [name]  Account Number:  [id number] Admit date:  01/25/2012  Clinical Social Worker:  Orpah Greek  Date/time:  01/31/2012 02:31 PM  Clinical Social Work is seeking post-discharge placement for this patient at the following level of care:   SKILLED NURSING   (*CSW will update this form in Epic as items are completed)   01/31/2012  Patient/family provided with [facility] Department of Clinical Social Work's list of facilities offering this level of care within the geographic area requested by the patient (or if unable, by the patient's family).  01/31/2012  Patient/family informed of their freedom to choose among providers that offer the needed level of care, that participate in Medicare, Medicaid or managed care program needed by the patient, have an available bed and are willing to accept the patient.  01/31/2012  Patient/family informed of [facility]' ownership interest in [facility], as well as of the fact that they are under no obligation to receive care at this facility.  PASARR submitted to EDS on 02/01/2012 PASARR number received from EDS on 02/01/2012  FL2 transmitted to all facilities in geographic area requested by pt/family on  01/31/2012 FL2 transmitted to all facilities within larger geographic area on   Patient informed that his/her managed care company has contracts with or will negotiate with  certain facilities, including the following:     Patient/family informed of bed offers received:  02/01/2012 Patient chooses bed at [facility] [place] Physician recommends and patient chooses bed at    [facility] be [facility] [place] on  02/01/2012 Patient to be transferred to facility by [facility]  The following physician request were entered in Epic:   Additional Comments:

## 2012-02-02 NOTE — ED Provider Notes (Signed)
Pt with hx pancreatitis. C/o epigastric pain. Epigastric tenderness, no rebound or guarding.   Medical screening examination/treatment/procedure(s) were conducted as a shared visit with non-physician practitioner(s) and myself.  I personally evaluated the patient during the encounter   Suzi Roots, MD 02/02/12 1340

## 2012-02-04 ENCOUNTER — Encounter (HOSPITAL_COMMUNITY): Payer: Self-pay | Admitting: Emergency Medicine

## 2012-02-04 ENCOUNTER — Emergency Department (HOSPITAL_COMMUNITY): Payer: Medicare Other

## 2012-02-04 ENCOUNTER — Emergency Department (HOSPITAL_COMMUNITY)
Admission: EM | Admit: 2012-02-04 | Discharge: 2012-02-05 | Disposition: A | Discharge status: Skilled Nursing Facility | Payer: Medicare Other | Attending: Emergency Medicine | Admitting: Emergency Medicine

## 2012-02-04 DIAGNOSIS — R7401 Elevation of levels of liver transaminase levels: Secondary | ICD-10-CM | POA: Insufficient documentation

## 2012-02-04 DIAGNOSIS — R7402 Elevation of levels of lactic acid dehydrogenase (LDH): Secondary | ICD-10-CM | POA: Insufficient documentation

## 2012-02-04 DIAGNOSIS — E119 Type 2 diabetes mellitus without complications: Secondary | ICD-10-CM | POA: Insufficient documentation

## 2012-02-04 DIAGNOSIS — N390 Urinary tract infection, site not specified: Secondary | ICD-10-CM

## 2012-02-04 DIAGNOSIS — E785 Hyperlipidemia, unspecified: Secondary | ICD-10-CM | POA: Insufficient documentation

## 2012-02-04 DIAGNOSIS — D72829 Elevated white blood cell count, unspecified: Secondary | ICD-10-CM

## 2012-02-04 DIAGNOSIS — R109 Unspecified abdominal pain: Secondary | ICD-10-CM | POA: Insufficient documentation

## 2012-02-04 DIAGNOSIS — R32 Unspecified urinary incontinence: Secondary | ICD-10-CM | POA: Insufficient documentation

## 2012-02-04 DIAGNOSIS — K219 Gastro-esophageal reflux disease without esophagitis: Secondary | ICD-10-CM | POA: Insufficient documentation

## 2012-02-04 DIAGNOSIS — I10 Essential (primary) hypertension: Secondary | ICD-10-CM | POA: Insufficient documentation

## 2012-02-04 DIAGNOSIS — H409 Unspecified glaucoma: Secondary | ICD-10-CM | POA: Insufficient documentation

## 2012-02-04 DIAGNOSIS — I4891 Unspecified atrial fibrillation: Secondary | ICD-10-CM | POA: Insufficient documentation

## 2012-02-04 LAB — URINALYSIS, ROUTINE W REFLEX MICROSCOPIC
Glucose, UA: NEGATIVE mg/dL
Protein, ur: NEGATIVE mg/dL
pH: 8.5 — ABNORMAL HIGH (ref 5.0–8.0)

## 2012-02-04 LAB — BASIC METABOLIC PANEL
CO2: 20 mEq/L (ref 19–32)
Calcium: 9.4 mg/dL (ref 8.4–10.5)
Chloride: 100 mEq/L (ref 96–112)
Sodium: 137 mEq/L (ref 135–145)

## 2012-02-04 LAB — CBC
MCV: 84.4 fL (ref 78.0–100.0)
Platelets: 592 10*3/uL — ABNORMAL HIGH (ref 150–400)
RBC: 3.97 MIL/uL (ref 3.87–5.11)
WBC: 15 10*3/uL — ABNORMAL HIGH (ref 4.0–10.5)

## 2012-02-04 LAB — URINE MICROSCOPIC-ADD ON

## 2012-02-04 NOTE — ED Notes (Signed)
Per EMS pt is c/o abd pain that she describes as pressure all over her abdomen  Pt has nausea without vomiting  Sxs started this am  Pt has hx of pancreatitis and kidney issues

## 2012-02-04 NOTE — ED Notes (Signed)
[id number]<GN> Expected date:<BR> Expected time: 9:51 PM<BR> Means of arrival:<BR> Comments:<BR> M50 - [name] Diffuse abd pain

## 2012-02-05 LAB — URINE CULTURE: Culture  Setup Time: 201305030545

## 2012-02-05 LAB — CULTURE, BLOOD (ROUTINE X 2)
Culture  Setup Time: 201304270130
Culture: NO GROWTH

## 2012-02-05 LAB — HEPATIC FUNCTION PANEL
AST: 94 U/L — ABNORMAL HIGH (ref 0–37)
Albumin: 3.3 g/dL — ABNORMAL LOW (ref 3.5–5.2)
Total Bilirubin: 0.3 mg/dL (ref 0.3–1.2)
Total Protein: 7.2 g/dL (ref 6.0–8.3)

## 2012-02-05 MED ORDER — NITROFURANTOIN MONOHYD MACRO 100 MG PO CAPS
100.0000 mg | ORAL_CAPSULE | Freq: Once | ORAL | Status: AC
  Administered 2012-02-05: 100 mg via ORAL
  Filled 2012-02-05: qty 1

## 2012-02-05 MED ORDER — POLYETHYLENE GLYCOL 3350 17 G PO PACK: 17.0000 g | PACK | Freq: Every day | ORAL | Status: AC

## 2012-02-05 MED ORDER — NITROFURANTOIN MONOHYD MACRO 100 MG PO CAPS: 100.0000 mg | ORAL_CAPSULE | Freq: Two times a day (BID) | ORAL | Status: AC

## 2012-02-05 MED ORDER — HYDROCODONE-ACETAMINOPHEN 5-500 MG PO TABS: 1.0000 | ORAL_TABLET | Freq: Four times a day (QID) | ORAL | Status: AC | PRN

## 2012-02-05 NOTE — Discharge Instructions (Signed)
Please follow up with your doctor for recheck in 1-2 days.  Return to the ER for worsening condition.  Take medications as prescribed.    Abdominal Pain Abdominal pain can be caused by many things. Your caregiver decides the seriousness of your pain by an examination and possibly blood tests and X-rays. Many cases can be observed and treated at home. Most abdominal pain is not caused by a disease and will probably improve without treatment. However, in many cases, more time must pass before a clear cause of the pain can be found. Before that point, it may not be known if you need more testing, or if hospitalization or surgery is needed. HOME CARE INSTRUCTIONS   Do not take laxatives unless directed by your caregiver.   Take pain medicine only as directed by your caregiver.   Only take over-the-counter or prescription medicines for pain, discomfort, or fever as directed by your caregiver.   Try a clear liquid diet (broth, tea, or water) for as long as directed by your caregiver. Slowly move to a bland diet as tolerated.  SEEK IMMEDIATE MEDICAL CARE IF:   The pain does not go away.   You have a fever.   You keep throwing up (vomiting).   The pain is felt only in portions of the abdomen. Pain in the right side could possibly be appendicitis. In an adult, pain in the left lower portion of the abdomen could be colitis or diverticulitis.   You pass bloody or black tarry stools.  MAKE SURE YOU:   Understand these instructions.   Will watch your condition.   Will get help right away if you are not doing well or get worse.  Document Released: 07/01/2005 Document Revised: 09/10/2011 Document Reviewed: 05/09/2008 [facility] Patient Information 2012 [place], [place].

## 2012-02-05 NOTE — ED Notes (Signed)
Patient discharge via [facility] back to [facility] nursing [facility]. Respirations equal and unlabored. Skin warm and dry. No acute distress noted.

## 2012-02-05 NOTE — ED Notes (Signed)
[facility] contacted for patient pick up.

## 2012-02-05 NOTE — ED Provider Notes (Signed)
History     CSN: [id number]  Arrival date & time 02/04/12  2149   First MD Initiated Contact with Patient 02/04/12 2305      Chief Complaint  Patient presents with  . Abdominal Pain    (Consider location/radiation/quality/duration/timing/severity/associated sxs/prior treatment) HPI 78 year old female presents to emergency room from her nursing home with report of abdominal pain described as pressure with some nausea. She has had no vomiting, fever. Patient with history of pancreatitis with recent admission for same. Patient has been in the nursing home for one week after being discharged from the hospital. Patient reports she is feeling much better, had large bowel movement upon arrival to the emergency department and no longer has abdominal pain. She does report that she is having difficulty holding her urine and has had incontinence over last several days. Past Medical History  Diagnosis Date  . Pancreatitis     ~2008 and again in 11/2011 s/p ERCP  . Hypertension   . Glaucoma   . High cholesterol   . GERD (gastroesophageal reflux disease)   . H/O hiatal hernia   . Atrial fibrillation     Coumadin  . Dizziness - light-headed   . Pneumonia 12/2011    "first time I know about"  . Shortness of breath on exertion   . Type II diabetes mellitus   . Depression   . Chronic lower back pain     Past Surgical History  Procedure Date  . Ercp 11/13/2011    Procedure: ENDOSCOPIC RETROGRADE CHOLANGIOPANCREATOGRAPHY (ERCP);  Surgeon: Theda Belfast, MD;  Location: [facility] ENDOSCOPY;  Service: Endoscopy;  Laterality: N/A;  . Tonsillectomy and adenoidectomy ~ 1939  . Cholecystectomy 1990's  . Appendectomy ~ 1960  . Vaginal hysterectomy ~ 1960's  . Dilation and curettage of uterus   . Cataract extraction w/ intraocular lens  implant, bilateral 2000's    Family History  Problem Relation Age of Onset  . Anesthesia problems Neg Hx   . Hypotension Neg Hx   . Malignant hyperthermia Neg Hx   .  Pseudochol deficiency Neg Hx     History  Substance Use Topics  . Smoking status: Never Smoker   . Smokeless tobacco: Never Used  . Alcohol Use: No    OB History    Grav Para Term Preterm Abortions TAB SAB Ect Mult Living                  Review of Systems  All other systems reviewed and are negative.    Allergies  Lactose intolerance (gi); Penicillins; and Sulfa antibiotics  Home Medications   Current Outpatient Rx  Name Route Sig Dispense Refill  . ALPRAZOLAM 1 MG PO TABS Oral Take 0.5 mg by mouth 4 (four) times daily as needed. Sleep/anxiety.    . ASPIRIN EC 81 MG PO TBEC Oral Take 81 mg by mouth daily.    Marland Kitchen DIGOXIN 0.125 MG PO TABS Oral Take 0.0625-0.125 mcg by mouth daily. Take 1 tablet on Tuesday,Thursday,Saturday, & Sunday Take 0.5 tablet on all other days    . DILTIAZEM HCL ER COATED BEADS 240 MG PO CP24 Oral Take 1 capsule (240 mg total) by mouth daily.    Marland Kitchen HYOSCYAMINE SULFATE 0.125 MG PO TABS Oral Take 0.125 mg by mouth every 4 (four) hours as needed. For abdominal pain    . XALATAN OP Both Eyes Place 1 drop into both eyes at bedtime.     Marland Kitchen LEVOFLOXACIN 750 MG PO TABS Oral Take  1 tablet (750 mg total) by mouth daily. 7 tablet 0  . METOPROLOL TARTRATE 25 MG PO TABS Oral Take 12.5 mg by mouth 2 (two) times daily.    . CENTRUM SILVER PO Oral Take 1 tablet by mouth daily.    Marland Kitchen OLANZAPINE 5 MG PO TABS Oral Take 5 mg by mouth at bedtime.    . OMEPRAZOLE 20 MG PO CPDR Oral Take 20 mg by mouth 2 (two) times daily.     . SERTRALINE HCL 100 MG PO TABS Oral Take 150 mg by mouth daily.     Marland Kitchen SIMVASTATIN 40 MG PO TABS Oral Take 40 mg by mouth every evening.    Marland Kitchen GLIMEPIRIDE 4 MG PO TABS Oral Take 2-4 mg by mouth See admin instructions. Take as needed as directed based on blood sugar.    . HYDROCODONE-ACETAMINOPHEN 5-500 MG PO TABS Oral Take 1 tablet by mouth every 6 (six) hours as needed for pain. 15 tablet 0  . NITROFURANTOIN MONOHYD MACRO 100 MG PO CAPS Oral Take 1 capsule  (100 mg total) by mouth 2 (two) times daily. 14 capsule 0  . POLYETHYLENE GLYCOL 3350 PO PACK Oral Take 17 g by mouth daily. PRN CONSTIPATION 14 each 0    BP 109/88  Pulse 65  Temp(Src) 98 F (36.7 C) (Oral)  Resp 17  SpO2 97%  Physical Exam  Nursing note and vitals reviewed. Constitutional: She appears well-nourished.       Frail-appearing, appears to be stated age  HENT:  Head: Normocephalic and atraumatic.  Eyes: Conjunctivae and EOM are normal. Pupils are equal, round, and reactive to light.  Neck: Normal range of motion. Neck supple. No JVD present. No tracheal deviation present. No thyromegaly present.  Cardiovascular: Normal rate, regular rhythm, normal heart sounds and intact distal pulses.  Exam reveals no gallop and no friction rub.   No murmur heard. Pulmonary/Chest: No stridor. No respiratory distress. She has no wheezes. She has no rales. She exhibits no tenderness.  Abdominal: Soft. She exhibits no distension and no mass. There is tenderness (very mild tenderness over suprapubic region). There is no rebound and no guarding.       Bowel sounds decreased but present  Musculoskeletal: Normal range of motion. She exhibits no edema and no tenderness.  Lymphadenopathy:    She has no cervical adenopathy.  Skin: Skin is warm and dry. No rash noted. No erythema. No pallor.    ED Course  Procedures (including critical care time)  Labs Reviewed  URINALYSIS, ROUTINE W REFLEX MICROSCOPIC - Abnormal; Notable for the following:    pH 8.5 (*)    Leukocytes, UA SMALL (*)    All other components within normal limits  BASIC METABOLIC PANEL - Abnormal; Notable for the following:    Potassium 3.3 (*)    Glucose, Bld 100 (*)    GFR calc non Af Amer 51 (*)    GFR calc Af Amer 59 (*)    All other components within normal limits  CBC - Abnormal; Notable for the following:    WBC 15.0 (*)    Hemoglobin 11.0 (*)    HCT 33.5 (*)    RDW 15.9 (*)    Platelets 592 (*)    All other  components within normal limits  HEPATIC FUNCTION PANEL - Abnormal; Notable for the following:    Albumin 3.3 (*)    AST 94 (*)    ALT 40 (*)    All other components within  normal limits  LIPASE, BLOOD  URINE MICROSCOPIC-ADD ON  URINE CULTURE   Dg Abd 2 Views  02/05/2012  *RADIOLOGY REPORT*  Clinical Data: Constipation, abdominal pain.  ABDOMEN - 2 VIEW  Comparison: 01/12/2012 CT  Findings: Lung bases are clear.  Surgical clips right upper quadrant.  Organ outlines otherwise normal where seen.  Bowel gas pattern is nonobstructive.  No free intraperitoneal air identified on decubitus view.  No acute osseous abnormality.  IMPRESSION: Nonobstructive bowel gas pattern.  Original Report Authenticated By: Waneta Martins, M.D.     1. Abdominal pain   2. Urinary tract infection   3. Leukocytosis   4. Transaminitis       MDM  78 year old female with abdominal pain which has since resolved after having bowel movement. We'll place the patient on MiraLAX to help with constipation patient with some white cells in her urine, sent for culture. Patient with allergy to penicillin and Bactrim. We'll place on nitrofurantoin for coverage for possible infection. Patient currently on Levaquin, however this is not best choice for her urinary tract infection at the dose she is on        Olivia Mackie, MD 02/05/12 [phone]

## 2012-03-11 ENCOUNTER — Ambulatory Visit (INDEPENDENT_AMBULATORY_CARE_PROVIDER_SITE_OTHER): Payer: Medicare Other | Admitting: Cardiology

## 2012-03-11 ENCOUNTER — Encounter: Payer: Self-pay | Admitting: Cardiology

## 2012-03-11 VITALS — BP 129/73 | HR 69 | Ht 60.0 in | Wt 116.8 lb

## 2012-03-11 DIAGNOSIS — I4891 Unspecified atrial fibrillation: Secondary | ICD-10-CM

## 2012-03-11 DIAGNOSIS — I059 Rheumatic mitral valve disease, unspecified: Secondary | ICD-10-CM

## 2012-03-11 DIAGNOSIS — I340 Nonrheumatic mitral (valve) insufficiency: Secondary | ICD-10-CM

## 2012-03-11 DIAGNOSIS — R002 Palpitations: Secondary | ICD-10-CM | POA: Insufficient documentation

## 2012-03-11 HISTORY — DX: Nonrheumatic mitral (valve) insufficiency: I34.0

## 2012-03-11 MED ORDER — WARFARIN SODIUM 5 MG PO TABS: 5.0000 mg | ORAL_TABLET | Freq: Every day | ORAL | Status: DC

## 2012-03-11 NOTE — Assessment & Plan Note (Signed)
The blood pressure is at target. No change in medications is indicated. We will continue with therapeutic lifestyle changes (TLC).  

## 2012-03-11 NOTE — Assessment & Plan Note (Signed)
I will follow this up with echocardiography.

## 2012-03-11 NOTE — Patient Instructions (Signed)
Please stop ASA in 3 days Start Warfarin 5 mg a day today  Follow up in [facility] on Monday.  Have blood work in 2 weeks (CBC)  Your physician has recommended that you wear a holter monitor for 48 hours. Holter monitors are medical devices that record the heart's electrical activity. Doctors most often use these monitors to diagnose arrhythmias. Arrhythmias are problems with the speed or rhythm of the heartbeat. The monitor is a small, portable device. You can wear one while you do your normal daily activities. This is usually used to diagnose what is causing palpitations/syncope (passing out).  Follow up in 2 months with Dr Antoine Poche.  Warfarin  Warfarin is a blood thinner (anticoagulant) medicine. It is used to keep clots from forming in your blood. When you take warfarin, you may bruise easily. You may also bleed a little longer if you cut yourself.  Before taking warfarin, tell your doctor if:  You take any other medicine for your heart or blood pressure.   You are pregnant.   You plan to get pregnant.   You are breastfeeding.   You are allergic to any medicine.  HOME CARE  Take warfarin as told by your doctor. Do not stop the medicine unless your doctor tells you to.   Take your medicine at the same time every day.   Do not take anything that has aspirin in it unless your doctor says it is okay.   Do not drink alcohol.   Tell all your doctors and dentists that you are taking warfarin before they treat you or give you any medicines.   Keep all your appointments for doctor visits and blood tests.   Keep warfarin out of reach of children. Do not share warfarin with anyone.   Eat about the same amount of vitamin K foods every day.   High vitamin K foods: Beef liver. Pork liver. Green tea. Broccoli. Brussels sprouts. Cauliflower. Chickpeas. Kale. Spinach. Turnip greens.   Medium vitamin K foods: Chicken liver. Pork tenderloin. Cheddar cheese. Rolled oats. Coffee.  Asparagus. Cabbage. Iceberg lettuce.   Low vitamin K foods: Apples. Butter. Bananas. Skim or 1% milk. Orange rice. Canned pears. White bread. Strawberries. Corn. Tomatoes. Green beans. Eggs. Potatoes. Tomasa Blase. Pumpkin. Chicken breasts. Ground beef. Oil (except soybean oil).  GET HELP RIGHT AWAY IF:  You miss a dose of warfarin. Do not take 2 doses at the same time.   You have a skin rash.   You have heavy or unusual bleeding.   There is blood in your pee (urine) or poop (stool).   You have side effects from medicine that do not get better after a few days.  MAKE SURE YOU:  Understand these instructions.   Will watch your condition.   Will get help right away if you are not doing well or get worse.  Document Released: 10/24/2010 Document Revised: 09/10/2011 Document Reviewed: 10/24/2010 [facility] Patient Information 2012 [place], [place].

## 2012-03-11 NOTE — Assessment & Plan Note (Signed)
As above.

## 2012-03-11 NOTE — Assessment & Plan Note (Signed)
[name] has a CHA2DS2 - VASc score of 4 with a risk of stroke of 4% per year.  Given this Coumadin is indicated. We will start this today. In about 3 days she will stop her ASA.  We discussed the risks and benefits.  I will place a 24 hour Holter to judge rate control. She will watch for evidence of bleeding.  I will check a CBC in 2 weeks.

## 2012-03-11 NOTE — Progress Notes (Signed)
HPI The patient presents for evaluation of atrial fibrillation. She has had this paroxysmally. She's not been seen in this clinic in about 4 years. I she was on warfarin in the past. However, this year she has had 4 hospitalizations for pancreatitis. There has been some GI bleeding. I went through each of the hospitalization records in detail to try to figure out the sequence of events. After the first hospitalization in February she was discharged on warfarin. However, it was stopped after the second hospitalization in March the warfarin was discontinued because of some GI bleeding. However, it was mentioned that this could be restarted after a period of a few weeks. She's had no recurrent evidence of GI bleeding in that time. She has persistent atrial fibrillation. She does not know the she's in this rhythm. She doesn't describe presyncope or syncope. She is frail and walks with a walker and has physical therapy at home. She stayed at rehabilitation for a while. She is not falling. She's had no dark stools or red blood. She's had no chest discomfort, neck or arm discomfort. She has no new shortness of breath, PND or orthopnea.  Of note she had an echo recently with a well preserved EF and moderate MR   Allergies  Allergen Reactions  . Lactose Intolerance (Gi) Nausea And Vomiting    severe stomach pain  . Penicillins Hives and Itching    "haven't used any since 1970's"  . Sulfa Antibiotics Hives and Itching    "haven't used any since 1970's"    Current Outpatient Prescriptions  Medication Sig Dispense Refill  . ALPRAZolam (XANAX) 1 MG tablet Take 0.5 mg by mouth 4 (four) times daily as needed. Sleep/anxiety.      Marland Kitchen aspirin EC 81 MG tablet Take 81 mg by mouth daily.      . cephALEXin (KEFLEX) 500 MG capsule Take 500 mg by mouth 3 (three) times daily.      . digoxin (LANOXIN) 0.125 MG tablet Take 0.0625-0.125 mcg by mouth daily. Take 1 tablet on Tuesday,Thursday,Saturday, & Sunday Take 0.5  tablet on all other days      . diltiazem (CARDIZEM CD) 240 MG 24 hr capsule Take 1 capsule (240 mg total) by mouth daily.      . furosemide (LASIX) 40 MG tablet Take 40 mg by mouth every Monday, Wednesday, and Friday.      Marland Kitchen glimepiride (AMARYL) 4 MG tablet Take 2-4 mg by mouth See admin instructions. 1/2 tab in am and 1/2 tab in pm      . hyoscyamine (LEVSIN, ANASPAZ) 0.125 MG tablet Take 0.125 mg by mouth every 4 (four) hours as needed. For abdominal pain      . Latanoprost (XALATAN OP) Place 1 drop into both eyes at bedtime.       Marland Kitchen lisinopril (PRINIVIL,ZESTRIL) 5 MG tablet Take 5 mg by mouth daily.      . metoprolol tartrate (LOPRESSOR) 25 MG tablet Take 12.5 mg by mouth 2 (two) times daily.      . Multiple Vitamins-Minerals (CENTRUM SILVER PO) Take 1 tablet by mouth daily.      Marland Kitchen OLANZapine (ZYPREXA) 5 MG tablet Take 5 mg by mouth at bedtime.      Marland Kitchen omeprazole (PRILOSEC) 20 MG capsule Take 20 mg by mouth 2 (two) times daily.       . sertraline (ZOLOFT) 100 MG tablet Take 150 mg by mouth daily.       . simvastatin (ZOCOR) 40 MG  tablet Take 40 mg by mouth every evening.      Marland Kitchen DISCONTD: warfarin (COUMADIN) 2 MG tablet Take 2 tablets (4 mg total) by mouth one time only at 6 PM.  30 tablet  0    Past Medical History  Diagnosis Date  . Pancreatitis     ~2008 and again in 11/2011 s/p ERCP  . Hypertension   . Glaucoma   . High cholesterol   . GERD (gastroesophageal reflux disease)   . H/O hiatal hernia   . Atrial fibrillation     Coumadin  . Dizziness - light-headed   . Pneumonia 12/2011    "first time I know about"  . Shortness of breath on exertion   . Type II diabetes mellitus   . Depression   . Chronic lower back pain     Past Surgical History  Procedure Date  . Ercp 11/13/2011    Procedure: ENDOSCOPIC RETROGRADE CHOLANGIOPANCREATOGRAPHY (ERCP);  Surgeon: Theda Belfast, MD;  Location: [facility] ENDOSCOPY;  Service: Endoscopy;  Laterality: N/A;  . Tonsillectomy and adenoidectomy ~  1939  . Cholecystectomy 1990's  . Appendectomy ~ 1960  . Vaginal hysterectomy ~ 1960's  . Dilation and curettage of uterus   . Cataract extraction w/ intraocular lens  implant, bilateral 2000's    Family History  Problem Relation Age of Onset  . Anesthesia problems Neg Hx   . Hypotension Neg Hx   . Malignant hyperthermia Neg Hx   . Pseudochol deficiency Neg Hx     History   Social History  . Marital Status: Married    Spouse Name: N/A    Number of Children: N/A  . Years of Education: N/A   Occupational History  . Not on file.   Social History Main Topics  . Smoking status: Never Smoker   . Smokeless tobacco: Never Used  . Alcohol Use: No  . Drug Use: No  . Sexually Active: Not Currently   Other Topics Concern  . Not on file   Social History Narrative  . No narrative on file    ROS:  As stated in the HPI and negative for all other systems.   PHYSICAL EXAM BP 129/73  Pulse 69  Ht 5' (1.524 m)  Wt 116 lb 12.8 oz (52.98 kg)  BMI 22.81 kg/m2 GENERAL:  Frail appearing HEENT:  Pupils equal round and reactive, fundi not visualized, oral mucosa unremarkable NECK:  No jugular venous distention, waveform within normal limits, carotid upstroke brisk and symmetric, no bruits, no thyromegaly LYMPHATICS:  No cervical, inguinal adenopathy LUNGS:  Clear to auscultation bilaterally BACK:  No CVA tenderness CHEST:  Unremarkable HEART:  PMI not displaced or sustained,S1 and S2 within normal limits, no S3, no clicks, no rubs, no murmurs, irregular ABD:  Flat, positive bowel sounds normal in frequency in pitch, no bruits, no rebound, no guarding, no midline pulsatile mass, no hepatomegaly, no splenomegaly EXT:  2 plus pulses throughout, no edema, no cyanosis no clubbing SKIN:  No rashes no nodules NEURO:  Cranial nerves II through XII grossly intact, motor grossly intact throughout PSYCH:  Cognitively intact, oriented to person place and time  ASSESSMENT AND PLAN    (Greater than 40 minutes reviewing all data with greater than 50% face to face with the patient).

## 2012-03-13 ENCOUNTER — Encounter (HOSPITAL_BASED_OUTPATIENT_CLINIC_OR_DEPARTMENT_OTHER): Payer: Self-pay | Admitting: *Deleted

## 2012-03-13 ENCOUNTER — Emergency Department (HOSPITAL_BASED_OUTPATIENT_CLINIC_OR_DEPARTMENT_OTHER): Payer: Medicare Other

## 2012-03-13 ENCOUNTER — Other Ambulatory Visit: Payer: Self-pay

## 2012-03-13 ENCOUNTER — Telehealth: Payer: Self-pay | Admitting: Physician Assistant

## 2012-03-13 ENCOUNTER — Inpatient Hospital Stay (HOSPITAL_BASED_OUTPATIENT_CLINIC_OR_DEPARTMENT_OTHER)
Admission: EM | Admit: 2012-03-13 | Discharge: 2012-03-15 | DRG: 392 | Disposition: A | Discharge status: Home / Self Care | Payer: Medicare Other | Attending: Internal Medicine | Admitting: Internal Medicine

## 2012-03-13 DIAGNOSIS — F32A Depression, unspecified: Secondary | ICD-10-CM

## 2012-03-13 DIAGNOSIS — E119 Type 2 diabetes mellitus without complications: Secondary | ICD-10-CM

## 2012-03-13 DIAGNOSIS — F3289 Other specified depressive episodes: Secondary | ICD-10-CM | POA: Diagnosis present

## 2012-03-13 DIAGNOSIS — Z7982 Long term (current) use of aspirin: Secondary | ICD-10-CM

## 2012-03-13 DIAGNOSIS — R509 Fever, unspecified: Secondary | ICD-10-CM

## 2012-03-13 DIAGNOSIS — A088 Other specified intestinal infections: Principal | ICD-10-CM | POA: Diagnosis present

## 2012-03-13 DIAGNOSIS — R197 Diarrhea, unspecified: Secondary | ICD-10-CM

## 2012-03-13 DIAGNOSIS — E871 Hypo-osmolality and hyponatremia: Secondary | ICD-10-CM

## 2012-03-13 DIAGNOSIS — Z9849 Cataract extraction status, unspecified eye: Secondary | ICD-10-CM

## 2012-03-13 DIAGNOSIS — F411 Generalized anxiety disorder: Secondary | ICD-10-CM | POA: Diagnosis present

## 2012-03-13 DIAGNOSIS — I10 Essential (primary) hypertension: Secondary | ICD-10-CM

## 2012-03-13 DIAGNOSIS — R6510 Systemic inflammatory response syndrome (SIRS) of non-infectious origin without acute organ dysfunction: Secondary | ICD-10-CM

## 2012-03-13 DIAGNOSIS — E876 Hypokalemia: Secondary | ICD-10-CM

## 2012-03-13 DIAGNOSIS — R002 Palpitations: Secondary | ICD-10-CM

## 2012-03-13 DIAGNOSIS — M545 Low back pain, unspecified: Secondary | ICD-10-CM | POA: Diagnosis present

## 2012-03-13 DIAGNOSIS — Z7901 Long term (current) use of anticoagulants: Secondary | ICD-10-CM

## 2012-03-13 DIAGNOSIS — K449 Diaphragmatic hernia without obstruction or gangrene: Secondary | ICD-10-CM | POA: Diagnosis present

## 2012-03-13 DIAGNOSIS — E7800 Pure hypercholesterolemia, unspecified: Secondary | ICD-10-CM

## 2012-03-13 DIAGNOSIS — F419 Anxiety disorder, unspecified: Secondary | ICD-10-CM

## 2012-03-13 DIAGNOSIS — J189 Pneumonia, unspecified organism: Secondary | ICD-10-CM

## 2012-03-13 DIAGNOSIS — E785 Hyperlipidemia, unspecified: Secondary | ICD-10-CM | POA: Diagnosis present

## 2012-03-13 DIAGNOSIS — D649 Anemia, unspecified: Secondary | ICD-10-CM

## 2012-03-13 DIAGNOSIS — Z79899 Other long term (current) drug therapy: Secondary | ICD-10-CM

## 2012-03-13 DIAGNOSIS — Z961 Presence of intraocular lens: Secondary | ICD-10-CM

## 2012-03-13 DIAGNOSIS — D689 Coagulation defect, unspecified: Secondary | ICD-10-CM

## 2012-03-13 DIAGNOSIS — K859 Acute pancreatitis without necrosis or infection, unspecified: Secondary | ICD-10-CM

## 2012-03-13 DIAGNOSIS — R112 Nausea with vomiting, unspecified: Secondary | ICD-10-CM

## 2012-03-13 DIAGNOSIS — K219 Gastro-esophageal reflux disease without esophagitis: Secondary | ICD-10-CM | POA: Diagnosis present

## 2012-03-13 DIAGNOSIS — K922 Gastrointestinal hemorrhage, unspecified: Secondary | ICD-10-CM

## 2012-03-13 DIAGNOSIS — N390 Urinary tract infection, site not specified: Secondary | ICD-10-CM

## 2012-03-13 DIAGNOSIS — D72829 Elevated white blood cell count, unspecified: Secondary | ICD-10-CM

## 2012-03-13 DIAGNOSIS — I340 Nonrheumatic mitral (valve) insufficiency: Secondary | ICD-10-CM

## 2012-03-13 DIAGNOSIS — I4891 Unspecified atrial fibrillation: Secondary | ICD-10-CM

## 2012-03-13 DIAGNOSIS — E872 Acidosis, unspecified: Secondary | ICD-10-CM | POA: Diagnosis present

## 2012-03-13 DIAGNOSIS — K625 Hemorrhage of anus and rectum: Secondary | ICD-10-CM | POA: Diagnosis present

## 2012-03-13 DIAGNOSIS — F329 Major depressive disorder, single episode, unspecified: Secondary | ICD-10-CM

## 2012-03-13 DIAGNOSIS — E860 Dehydration: Secondary | ICD-10-CM | POA: Diagnosis present

## 2012-03-13 DIAGNOSIS — G8929 Other chronic pain: Secondary | ICD-10-CM | POA: Diagnosis present

## 2012-03-13 DIAGNOSIS — H409 Unspecified glaucoma: Secondary | ICD-10-CM

## 2012-03-13 HISTORY — DX: Systemic inflammatory response syndrome (sirs) of non-infectious origin without acute organ dysfunction: R65.10

## 2012-03-13 LAB — CBC
MCH: 28.1 pg (ref 26.0–34.0)
MCHC: 34.8 g/dL (ref 30.0–36.0)
Platelets: 279 10*3/uL (ref 150–400)
RBC: 4.45 MIL/uL (ref 3.87–5.11)

## 2012-03-13 LAB — LACTIC ACID, PLASMA: Lactic Acid, Venous: 3 mmol/L — ABNORMAL HIGH (ref 0.5–2.2)

## 2012-03-13 LAB — COMPREHENSIVE METABOLIC PANEL
ALT: 17 U/L (ref 0–35)
Albumin: 3.5 g/dL (ref 3.5–5.2)
Alkaline Phosphatase: 74 U/L (ref 39–117)
Chloride: 101 mEq/L (ref 96–112)
Potassium: 4.4 mEq/L (ref 3.5–5.1)
Sodium: 132 mEq/L — ABNORMAL LOW (ref 135–145)
Total Protein: 7.2 g/dL (ref 6.0–8.3)

## 2012-03-13 LAB — DIFFERENTIAL
Basophils Relative: 0 % (ref 0–1)
Eosinophils Absolute: 0.1 10*3/uL (ref 0.0–0.7)
Lymphs Abs: 1.7 10*3/uL (ref 0.7–4.0)
Neutro Abs: 18 10*3/uL — ABNORMAL HIGH (ref 1.7–7.7)
Neutrophils Relative %: 88 % — ABNORMAL HIGH (ref 43–77)

## 2012-03-13 LAB — URINALYSIS, ROUTINE W REFLEX MICROSCOPIC
Leukocytes, UA: NEGATIVE
Nitrite: NEGATIVE
Specific Gravity, Urine: 1.018 (ref 1.005–1.030)
Urobilinogen, UA: 0.2 mg/dL (ref 0.0–1.0)

## 2012-03-13 LAB — GLUCOSE, CAPILLARY

## 2012-03-13 LAB — PRO B NATRIURETIC PEPTIDE: Pro B Natriuretic peptide (BNP): 1505 pg/mL — ABNORMAL HIGH (ref 0–450)

## 2012-03-13 LAB — DIGOXIN LEVEL: Digoxin Level: 1.6 ng/mL (ref 0.8–2.0)

## 2012-03-13 MED ORDER — ACETAMINOPHEN 650 MG RE SUPP: 650.0000 mg | Freq: Four times a day (QID) | RECTAL | Status: DC | PRN

## 2012-03-13 MED ORDER — ASPIRIN 81 MG PO TABS: 81.0000 mg | ORAL_TABLET | Freq: Every day | ORAL | Status: DC

## 2012-03-13 MED ORDER — SIMVASTATIN 20 MG PO TABS
20.0000 mg | ORAL_TABLET | Freq: Every day | ORAL | Status: DC
  Administered 2012-03-13: 20 mg via ORAL
  Filled 2012-03-13 (×2): qty 1

## 2012-03-13 MED ORDER — ONDANSETRON HCL 4 MG/2ML IJ SOLN: 4.0000 mg | Freq: Four times a day (QID) | INTRAMUSCULAR | Status: DC | PRN

## 2012-03-13 MED ORDER — SERTRALINE HCL 50 MG PO TABS
150.0000 mg | ORAL_TABLET | Freq: Every day | ORAL | Status: DC
  Administered 2012-03-14 – 2012-03-15 (×2): 150 mg via ORAL
  Filled 2012-03-13 (×2): qty 1

## 2012-03-13 MED ORDER — DILTIAZEM HCL ER COATED BEADS 240 MG PO CP24
240.0000 mg | ORAL_CAPSULE | Freq: Every day | ORAL | Status: DC
  Administered 2012-03-14 – 2012-03-15 (×2): 240 mg via ORAL
  Filled 2012-03-13 (×2): qty 1

## 2012-03-13 MED ORDER — METRONIDAZOLE IN NACL 5-0.79 MG/ML-% IV SOLN
500.0000 mg | Freq: Once | INTRAVENOUS | Status: AC
  Administered 2012-03-13: 500 mg via INTRAVENOUS
  Filled 2012-03-13: qty 100

## 2012-03-13 MED ORDER — VANCOMYCIN HCL IN DEXTROSE 1-5 GM/200ML-% IV SOLN: 1000.0000 mg | Freq: Once | INTRAVENOUS | Status: DC

## 2012-03-13 MED ORDER — OLANZAPINE 5 MG PO TABS
5.0000 mg | ORAL_TABLET | Freq: Every day | ORAL | Status: DC
Start: 2012-03-13 — End: 2012-03-15
  Administered 2012-03-13 – 2012-03-14 (×2): 5 mg via ORAL
  Filled 2012-03-13 (×3): qty 1

## 2012-03-13 MED ORDER — INSULIN ASPART 100 UNIT/ML ~~LOC~~ SOLN
0.0000 [IU] | Freq: Three times a day (TID) | SUBCUTANEOUS | Status: DC
  Administered 2012-03-15 (×2): 1 [IU] via SUBCUTANEOUS

## 2012-03-13 MED ORDER — LATANOPROST 0.005 % OP SOLN
1.0000 [drp] | Freq: Every day | OPHTHALMIC | Status: DC
  Administered 2012-03-14: 1 [drp] via OPHTHALMIC
  Filled 2012-03-13 (×2): qty 2.5

## 2012-03-13 MED ORDER — OXYCODONE HCL 5 MG PO TABS: 5.0000 mg | ORAL_TABLET | ORAL | Status: DC | PRN

## 2012-03-13 MED ORDER — DEXTROSE 5 % IV SOLN
1.0000 g | Freq: Two times a day (BID) | INTRAVENOUS | Status: DC
  Filled 2012-03-13: qty 1

## 2012-03-13 MED ORDER — SODIUM CHLORIDE 0.9 % IV BOLUS (SEPSIS)
500.0000 mL | Freq: Once | INTRAVENOUS | Status: AC
  Administered 2012-03-13: 500 mL via INTRAVENOUS

## 2012-03-13 MED ORDER — ONDANSETRON HCL 4 MG/2ML IJ SOLN: 4.0000 mg | Freq: Three times a day (TID) | INTRAMUSCULAR | Status: DC | PRN

## 2012-03-13 MED ORDER — SODIUM CHLORIDE 0.9 % IV SOLN: INTRAVENOUS | Status: DC

## 2012-03-13 MED ORDER — ACETAMINOPHEN 325 MG PO TABS
650.0000 mg | ORAL_TABLET | Freq: Four times a day (QID) | ORAL | Status: DC | PRN
  Administered 2012-03-15: 650 mg via ORAL
  Filled 2012-03-13: qty 2

## 2012-03-13 MED ORDER — METRONIDAZOLE IN NACL 5-0.79 MG/ML-% IV SOLN
500.0000 mg | Freq: Three times a day (TID) | INTRAVENOUS | Status: DC
  Administered 2012-03-13 – 2012-03-14 (×2): 500 mg via INTRAVENOUS
  Filled 2012-03-13 (×4): qty 100

## 2012-03-13 MED ORDER — CEFOXITIN SODIUM 1 G IV SOLR
INTRAVENOUS | Status: AC
  Filled 2012-03-13: qty 1

## 2012-03-13 MED ORDER — ENOXAPARIN SODIUM 40 MG/0.4ML ~~LOC~~ SOLN
40.0000 mg | SUBCUTANEOUS | Status: DC
  Filled 2012-03-13: qty 0.4

## 2012-03-13 MED ORDER — ALPRAZOLAM 0.5 MG PO TABS: 0.5000 mg | ORAL_TABLET | Freq: Four times a day (QID) | ORAL | Status: DC | PRN

## 2012-03-13 MED ORDER — SODIUM CHLORIDE 0.9 % IV SOLN
INTRAVENOUS | Status: DC
  Administered 2012-03-14 (×2): via INTRAVENOUS

## 2012-03-13 MED ORDER — WARFARIN SODIUM 5 MG PO TABS
5.0000 mg | ORAL_TABLET | Freq: Once | ORAL | Status: DC
  Filled 2012-03-13: qty 1

## 2012-03-13 MED ORDER — ASPIRIN EC 81 MG PO TBEC
81.0000 mg | DELAYED_RELEASE_TABLET | Freq: Every day | ORAL | Status: DC
  Administered 2012-03-14 – 2012-03-15 (×2): 81 mg via ORAL
  Filled 2012-03-13 (×2): qty 1

## 2012-03-13 MED ORDER — ONDANSETRON HCL 4 MG PO TABS: 4.0000 mg | ORAL_TABLET | Freq: Four times a day (QID) | ORAL | Status: DC | PRN

## 2012-03-13 MED ORDER — WARFARIN - PHARMACIST DOSING INPATIENT: Freq: Every day | Status: DC

## 2012-03-13 MED ORDER — DIGOXIN 0.0625 MG HALF TABLET
0.0625 mg | ORAL_TABLET | Freq: Every day | ORAL | Status: DC
  Administered 2012-03-14 – 2012-03-15 (×2): 0.0625 mg via ORAL
  Filled 2012-03-13 (×2): qty 1

## 2012-03-13 MED ORDER — ADULT MULTIVITAMIN W/MINERALS CH
1.0000 | ORAL_TABLET | Freq: Every day | ORAL | Status: DC
  Administered 2012-03-14 – 2012-03-15 (×2): 1 via ORAL
  Filled 2012-03-13 (×2): qty 1

## 2012-03-13 MED ORDER — MORPHINE SULFATE 2 MG/ML IJ SOLN: 2.0000 mg | INTRAMUSCULAR | Status: DC | PRN

## 2012-03-13 NOTE — Telephone Encounter (Signed)
Patient's husband called to report that last night she had an episode of repeated nausea and vomiting overnight. This morning when she woke up she had rectal bleeding/spotting and then in her Depends this morning she had bright red frank blood mixed in with her fecal material. She was just restarted on Coumadin on 03/11/12. She has a hx of GI bleeding. I advised they proceed to ER for eval. The patient's husband verbalized understanding and gratitude. Nayely Dingus PA-C

## 2012-03-13 NOTE — ED Provider Notes (Signed)
History     CSN: [id number]  Arrival date & time 03/13/12  1357   First MD Initiated Contact with Patient 03/13/12 1415      Chief Complaint  Patient presents with  . Emesis    (Consider location/radiation/quality/duration/timing/severity/associated sxs/prior treatment) HPI Patient is an 78 year old female who presents today complaining of "not feeling right" as well as nausea and vomiting since last night. Patient has had recent difficult medical course with admission for pancreatitis. Last discharge from the hospital was April 29 and the patient spent 4 weeks in a nursing facility following this. She has just completed therapy with Keflex for a urinary tract infection. Patient reports that she developed nausea, vomiting, and diarrhea last night. She presents with tachycardia and tachypnea with temp of 36.4. Patient denies chest pain or abdominal pain. She does not have any history of C. difficile. Patient does have a history of atrial fibrillation and recently has been supratherapeutic on her Coumadin that she takes for this. Patient denies any numbness, tingling, or headache. Patient is alert and oriented. There are no other associated or modifying factors. Past Medical History  Diagnosis Date  . Pancreatitis     ~2008 and again in 11/2011 s/p ERCP  . Hypertension   . Glaucoma   . High cholesterol   . GERD (gastroesophageal reflux disease)   . H/O hiatal hernia   . Atrial fibrillation   . Dizziness - light-headed   . Pneumonia 12/2011    "first time I know about"  . Shortness of breath on exertion   . Type II diabetes mellitus   . Depression   . Chronic lower back pain     Past Surgical History  Procedure Date  . Ercp 11/13/2011    Procedure: ENDOSCOPIC RETROGRADE CHOLANGIOPANCREATOGRAPHY (ERCP);  Surgeon: Theda Belfast, MD;  Location: [facility] ENDOSCOPY;  Service: Endoscopy;  Laterality: N/A;  . Tonsillectomy and adenoidectomy ~ 1939  . Cholecystectomy 1990's  . Appendectomy ~  1960  . Vaginal hysterectomy ~ 1960's  . Dilation and curettage of uterus   . Cataract extraction w/ intraocular lens  implant, bilateral 2000's    Family History  Problem Relation Age of Onset  . Anesthesia problems Neg Hx   . Hypotension Neg Hx   . Malignant hyperthermia Neg Hx   . Pseudochol deficiency Neg Hx   . Stroke Father 76  . Stroke Mother 80    History  Substance Use Topics  . Smoking status: Never Smoker   . Smokeless tobacco: Never Used  . Alcohol Use: No    OB History    Grav Para Term Preterm Abortions TAB SAB Ect Mult Living                  Review of Systems  Constitutional: Positive for fatigue. Negative for fever.  HENT: Negative.   Eyes: Negative.   Respiratory: Negative.   Cardiovascular: Negative.   Gastrointestinal: Positive for nausea, vomiting and diarrhea.  Genitourinary: Negative.   Musculoskeletal: Negative.   Neurological: Negative.   Hematological: Negative.   Psychiatric/Behavioral: Negative.   All other systems reviewed and are negative.    Allergies  Lactose intolerance (gi); Penicillins; and Sulfa antibiotics  Home Medications   Current Outpatient Rx  Name Route Sig Dispense Refill  . ALPRAZOLAM 1 MG PO TABS Oral Take 0.5 mg by mouth 4 (four) times daily as needed. Sleep/anxiety.    . CEPHALEXIN 500 MG PO CAPS Oral Take 500 mg by mouth 3 (three) times  daily.    Marland Kitchen DIGOXIN 0.125 MG PO TABS Oral Take 0.0625-0.125 mcg by mouth daily. Take 1 tablet on Tuesday,Thursday,Saturday, & Sunday Take 0.5 tablet on all other days    . DILTIAZEM HCL ER COATED BEADS 240 MG PO CP24 Oral Take 1 capsule (240 mg total) by mouth daily.    . FUROSEMIDE 40 MG PO TABS Oral Take 40 mg by mouth every Monday, Wednesday, and Friday.    Marland Kitchen GLIMEPIRIDE 4 MG PO TABS Oral Take 2-4 mg by mouth See admin instructions. 1/2 tab in am and 1/2 tab in pm    . HYOSCYAMINE SULFATE 0.125 MG PO TABS Oral Take 0.125 mg by mouth every 4 (four) hours as needed. For  abdominal pain    . XALATAN OP Both Eyes Place 1 drop into both eyes at bedtime.     Marland Kitchen LISINOPRIL 5 MG PO TABS Oral Take 5 mg by mouth daily.    Marland Kitchen METOPROLOL TARTRATE 25 MG PO TABS Oral Take 12.5 mg by mouth 2 (two) times daily.    . CENTRUM SILVER PO Oral Take 1 tablet by mouth daily.    Marland Kitchen OLANZAPINE 5 MG PO TABS Oral Take 5 mg by mouth at bedtime.    . OMEPRAZOLE 20 MG PO CPDR Oral Take 20 mg by mouth 2 (two) times daily.     . SERTRALINE HCL 100 MG PO TABS Oral Take 150 mg by mouth daily.     Marland Kitchen SIMVASTATIN 40 MG PO TABS Oral Take 40 mg by mouth every evening.    . WARFARIN SODIUM 5 MG PO TABS Oral Take 1 tablet (5 mg total) by mouth daily. 30 tablet 3    BP 113/62  Pulse 106  Temp(Src) 97.6 F (36.4 C) (Oral)  Resp 28  Ht 5' (1.524 m)  Wt 117 lb (53.071 kg)  BMI 22.85 kg/m2  SpO2 100%  Physical Exam  Nursing note and vitals reviewed. GEN: Well-developed, elderly female in moderate distress HEENT: Atraumatic, normocephalic. Oropharynx clear without erythema EYES: PERRLA BL, no scleral icterus. NECK: Trachea midline, no meningismus CV: regular rate and irregularly irregular rhythm. No murmurs, rubs, or gallops PULM: Tachypneic No crackles, wheezes, or rales. GI: soft, non-tender. No guarding, rebound, or tenderness. + bowel sounds  GU: deferred Neuro: cranial nerves 2-12 intact, no abnormalities of strength or sensation, A and O x 3 MSK: Patient moves all 4 extremities symmetrically, no deformity, edema, or injury noted Skin: No rashes petechiae, purpura, or jaundice Psych: no abnormality of mood   ED Course  Procedures (including critical care time)   Date: 03/13/2012  Rate: 93  Rhythm: atrial fibrillation  QRS Axis: normal  Intervals: normal  ST/T Wave abnormalities: normal  Conduction Disutrbances:none  Narrative Interpretation:   Old EKG Reviewed: unchanged   Labs Reviewed  CBC - Abnormal; Notable for the following:    WBC 20.4 (*)    HCT 35.9 (*)    All  other components within normal limits  DIFFERENTIAL - Abnormal; Notable for the following:    Neutrophils Relative 88 (*)    Neutro Abs 18.0 (*)    Lymphocytes Relative 8 (*)    All other components within normal limits  COMPREHENSIVE METABOLIC PANEL - Abnormal; Notable for the following:    Sodium 132 (*)    CO2 16 (*)    Glucose, Bld 137 (*)    BUN 36 (*)    GFR calc non Af Amer 52 (*)    GFR calc  Af Amer 60 (*)    All other components within normal limits  PROTIME-INR - Abnormal; Notable for the following:    Prothrombin Time 16.5 (*)    All other components within normal limits  LACTIC ACID, PLASMA - Abnormal; Notable for the following:    Lactic Acid, Venous 3.0 (*)    All other components within normal limits  LIPASE, BLOOD  DIGOXIN LEVEL  URINALYSIS, ROUTINE W REFLEX MICROSCOPIC  TROPONIN I  GLUCOSE, CAPILLARY  URINE CULTURE   Dg Abd Acute W/chest  03/13/2012  *RADIOLOGY REPORT*  Clinical Data: Vomiting  ACUTE ABDOMEN SERIES (ABDOMEN 2 VIEW & CHEST 1 VIEW)  Comparison: 02/05/2012 and 01/29/2012  Findings: Cardiomediastinal silhouette is stable.  No acute infiltrate or pleural effusion.  No pulmonary edema. Mitral annulus calcifications again noted.  There is nonspecific nonobstructive bowel gas pattern.  Post cholecystectomy surgical clips are noted. No free abdominal air.  IMPRESSION: Nonspecific nonobstructive bowel gas pattern.  No free abdominal air.  No acute disease within chest.  Original Report Authenticated By: Natasha Mead, M.D.     1. SIRS (systemic inflammatory response syndrome)   2. Nausea vomiting and diarrhea       MDM  Patient was evaluated by myself upon presentation. She had atrial fibrillation and intermittently showed some tachycardia but was not in persistent A. fib with RVR. Patient was to giving as well. She had had nausea, vomiting, and diarrhea. She had no known sick contacts. Patient has recently been treated with numerous antibiotics given her  pancreatitis admission as well as recent UTI that she is not currently on antibiotics and has no history of C. difficile. Patient's belly was soft and she had no chest pain. EKG was unremarkable and troponin was negative. Digoxin level was therapeutic the INR was subtherapeutic. Patient did have elevated BUN/creatinine ratio with no renal failure. Blood culture was collected. Urine culture was was ordered as well the urinalysis was unremarkable. Chest x-ray showed no evidence of pulmonary edema or infiltrate. Patient had white count of 20.4. Lactate was 3. Patient was given IV fluid and broad-spectrum antibiotics were ordered the source of infection had not been identified. Patient clearly had SIRS.  Call was placed for admission. Liver panel, and lipase were unremarkable. Vancomycin and cefepime were ordered initially given possible sepsis though a definite source was not identified.  Patient was discussed with Dr. Mellody Drown was concerned about C. difficile and preferred to treat patient with IV Flagyl. Patient received only small amount of cefepime prior to this change in therapy. Patient remained borderline tachycardic but was normotensive. She'll be transferred to [facility] for further management.        Cyndra Numbers, MD 03/13/12 [phone]

## 2012-03-13 NOTE — H&P (Addendum)
PCP:   Cecile Hearing, MD, MD   Chief Complaint:  Nausea, vomiting, diarrhea and generalized weakness.  HPI: 78 y/o female with PMH significant for HTN, Glaucoma, Atrial fibrillation (on coumadin), HLD and recent hospitalization due to pancreatitis with short nursing home placement for physical rehab; who came to the ED complaining of nausea, vomiting, diarrhea and GI upset symptoms for the last 24-36 hours. Patient endorses recently finished course of keflex for UTI. Denies CP, HA, fever, chills or hematemesis. Patient reports some blood tinged stool this morning along with her diarrhea.  In the ED she was found to be borderline hypotensive, with elevated WBC's (up to 20,000); dehydrated and mild hypothermic. TRH was called to admit patient for SIRS.  Of note her CXR w/o acute cardiopulmonary process and she denies any cough.  Allergies:   Allergies  Allergen Reactions  . Lactose Intolerance (Gi) Nausea And Vomiting    severe stomach pain  . Penicillins Hives and Itching    "haven't used any since 1970's"  . Sulfa Antibiotics Hives and Itching    "haven't used any since 1970's"      Past Medical History  Diagnosis Date  . Pancreatitis     ~2008 and again in 11/2011 s/p ERCP  . Hypertension   . Glaucoma   . High cholesterol   . GERD (gastroesophageal reflux disease)   . H/O hiatal hernia   . Atrial fibrillation   . Dizziness - light-headed   . Pneumonia 12/2011    "first time I know about"  . Shortness of breath on exertion   . Type II diabetes mellitus   . Depression   . Chronic lower back pain     Past Surgical History  Procedure Date  . Ercp 11/13/2011    Procedure: ENDOSCOPIC RETROGRADE CHOLANGIOPANCREATOGRAPHY (ERCP);  Surgeon: Theda Belfast, MD;  Location: [facility] ENDOSCOPY;  Service: Endoscopy;  Laterality: N/A;  . Tonsillectomy and adenoidectomy ~ 1939  . Cholecystectomy 1990's  . Appendectomy ~ 1960  . Vaginal hysterectomy ~ 1960's  . Dilation and curettage of  uterus   . Cataract extraction w/ intraocular lens  implant, bilateral 2000's    Prior to Admission medications   Medication Sig Start Date End Date Taking? Authorizing Provider  aspirin 81 MG tablet Take 81 mg by mouth daily.   Yes Historical Provider, MD  cephALEXin (KEFLEX) 500 MG capsule Take 500 mg by mouth 3 (three) times daily.   Yes Historical Provider, MD  digoxin (LANOXIN) 0.125 MG tablet Take 0.0625-0.125 mcg by mouth daily. Take 1 tablet on Tuesday,Thursday,Saturday, & Sunday Take 0.5 tablet on all other days   Yes Historical Provider, MD  diltiazem (CARDIZEM CD) 240 MG 24 hr capsule Take 1 capsule (240 mg total) by mouth daily. 02/01/12 01/31/13 Yes Estela Isaiah Blakes, MD  furosemide (LASIX) 40 MG tablet Take 40 mg by mouth every Monday, Wednesday, and Friday.   Yes Historical Provider, MD  glimepiride (AMARYL) 4 MG tablet Take 2-4 mg by mouth See admin instructions. 1/2 tab in am and 1/2 tab in pm 01/16/12  Yes Shanker Levora Dredge, MD  HYDROcodone-acetaminophen (VICODIN) 5-500 MG per tablet Take 1 tablet by mouth every 6 (six) hours as needed.   Yes Historical Provider, MD  Latanoprost (XALATAN OP) Place 1 drop into both eyes at bedtime.    Yes Historical Provider, MD  lisinopril (PRINIVIL,ZESTRIL) 5 MG tablet Take 5 mg by mouth daily.   Yes Historical Provider, MD  Multiple Vitamins-Minerals (CENTRUM SILVER  PO) Take 1 tablet by mouth daily.   Yes Historical Provider, MD  OLANZapine (ZYPREXA) 5 MG tablet Take 5 mg by mouth at bedtime.   Yes Historical Provider, MD  omeprazole (PRILOSEC) 20 MG capsule Take 20 mg by mouth 2 (two) times daily.    Yes Historical Provider, MD  sertraline (ZOLOFT) 100 MG tablet Take 150 mg by mouth daily.    Yes Historical Provider, MD  simvastatin (ZOCOR) 20 MG tablet Take 20 mg by mouth every evening.   Yes Historical Provider, MD  warfarin (COUMADIN) 5 MG tablet Take 1 tablet (5 mg total) by mouth daily. 03/11/12 03/11/13 Yes Rollene Rotunda, MD    ALPRAZolam Prudy Feeler) 1 MG tablet Take 0.5 mg by mouth 4 (four) times daily as needed. Sleep/anxiety.    Historical Provider, MD  hyoscyamine (LEVSIN, ANASPAZ) 0.125 MG tablet Take 0.125 mg by mouth every 4 (four) hours as needed. For abdominal pain    Historical Provider, MD  metoprolol tartrate (LOPRESSOR) 25 MG tablet Take 12.5 mg by mouth 2 (two) times daily.    Historical Provider, MD    Social History:  reports that she has never smoked. She has never used smokeless tobacco. She reports that she does not drink alcohol or use illicit drugs.  Family History  Problem Relation Age of Onset  . Anesthesia problems Neg Hx   . Hypotension Neg Hx   . Malignant hyperthermia Neg Hx   . Pseudochol deficiency Neg Hx   . Stroke Father 60  . Stroke Mother 67    Review of Systems:  Generalized fatigue and weakness; also with nausea, diarrhea and vomiting (no further vomiting since in the ED).  Physical Exam: Blood pressure 90/46, pulse 87, temperature 97.5 F (36.4 C), temperature source Oral, resp. rate 20, height 5' (1.524 m), weight 55.5 kg (122 lb 5.7 oz), SpO2 97.00%. GEN: elderly female in moderate distress; dry mucous membranes; able to speak in full sentences and follow commands properly.  HEENT: Atraumatic, normocephalic. Oropharynx without erythema or exudates EYES: PERRLA, EOMI, no scleral icterus; no nystagmus. NECK: Trachea midline, no meningismus; no bruits CV: irregularly irregular rhythm. No murmurs, rubs, or gallops. No JVD  PULM: Tachypneic No crackles, wheezes, or rales.  GI: soft, non-tender. No guarding, rebound, or tenderness. + bowel sounds  Neuro: cranial nerves 2-12 intact, no abnormalities of strength or sensation, A and O x 3  MSK: Patient moves all 4 extremities symmetrically, no deformity, edema, or injury noted  Skin: No rashes petechiae, purpura, or jaundice     Labs on Admission:  Results for orders placed during the hospital encounter of 03/13/12 (from the  past 48 hour(s))  CBC     Status: Abnormal   Collection Time   03/13/12  2:30 PM      Component Value Range Comment   WBC 20.4 (*) 4.0 - 10.5 (K/uL)    RBC 4.45  3.87 - 5.11 (MIL/uL)    Hemoglobin 12.5  12.0 - 15.0 (g/dL)    HCT 16.1 (*) 09.6 - 46.0 (%)    MCV 80.7  78.0 - 100.0 (fL)    MCH 28.1  26.0 - 34.0 (pg)    MCHC 34.8  30.0 - 36.0 (g/dL)    RDW 04.5  40.9 - 81.1 (%)    Platelets 279  150 - 400 (K/uL)   DIFFERENTIAL     Status: Abnormal   Collection Time   03/13/12  2:30 PM      Component Value Range  Comment   Neutrophils Relative 88 (*) 43 - 77 (%)    Neutro Abs 18.0 (*) 1.7 - 7.7 (K/uL)    Lymphocytes Relative 8 (*) 12 - 46 (%)    Lymphs Abs 1.7  0.7 - 4.0 (K/uL)    Monocytes Relative 3  3 - 12 (%)    Monocytes Absolute 0.7  0.1 - 1.0 (K/uL)    Eosinophils Relative 0  0 - 5 (%)    Eosinophils Absolute 0.1  0.0 - 0.7 (K/uL)    Basophils Relative 0  0 - 1 (%)    Basophils Absolute 0.0  0.0 - 0.1 (K/uL)   COMPREHENSIVE METABOLIC PANEL     Status: Abnormal   Collection Time   03/13/12  2:30 PM      Component Value Range Comment   Sodium 132 (*) 135 - 145 (mEq/L)    Potassium 4.4  3.5 - 5.1 (mEq/L)    Chloride 101  96 - 112 (mEq/L)    CO2 16 (*) 19 - 32 (mEq/L)    Glucose, Bld 137 (*) 70 - 99 (mg/dL)    BUN 36 (*) 6 - 23 (mg/dL)    Creatinine, Ser 4.09  0.50 - 1.10 (mg/dL)    Calcium 9.6  8.4 - 10.5 (mg/dL)    Total Protein 7.2  6.0 - 8.3 (g/dL)    Albumin 3.5  3.5 - 5.2 (g/dL)    AST 20  0 - 37 (U/L)    ALT 17  0 - 35 (U/L)    Alkaline Phosphatase 74  39 - 117 (U/L)    Total Bilirubin 0.3  0.3 - 1.2 (mg/dL)    GFR calc non Af Amer 52 (*) >90 (mL/min)    GFR calc Af Amer 60 (*) >90 (mL/min)   PROTIME-INR     Status: Abnormal   Collection Time   03/13/12  2:30 PM      Component Value Range Comment   Prothrombin Time 16.5 (*) 11.6 - 15.2 (seconds)    INR 1.31  0.00 - 1.49    LIPASE, BLOOD     Status: Normal   Collection Time   03/13/12  2:30 PM      Component Value  Range Comment   Lipase 41  11 - 59 (U/L)   LACTIC ACID, PLASMA     Status: Abnormal   Collection Time   03/13/12  2:30 PM      Component Value Range Comment   Lactic Acid, Venous 3.0 (*) 0.5 - 2.2 (mmol/L)   DIGOXIN LEVEL     Status: Normal   Collection Time   03/13/12  2:30 PM      Component Value Range Comment   Digoxin Level 1.6  0.8 - 2.0 (ng/mL)   TROPONIN I     Status: Normal   Collection Time   03/13/12  2:30 PM      Component Value Range Comment   Troponin I <0.30  <0.30 (ng/mL)   GLUCOSE, CAPILLARY     Status: Normal   Collection Time   03/13/12  3:12 PM      Component Value Range Comment   Glucose-Capillary 97  70 - 99 (mg/dL)    Comment 1 Notify RN      Comment 2 Documented in Chart     URINALYSIS, ROUTINE W REFLEX MICROSCOPIC     Status: Normal   Collection Time   03/13/12  3:13 PM      Component Value Range Comment  Color, Urine YELLOW  YELLOW     APPearance CLEAR  CLEAR     Specific Gravity, Urine 1.018  1.005 - 1.030     pH 6.0  5.0 - 8.0     Glucose, UA NEGATIVE  NEGATIVE (mg/dL)    Hgb urine dipstick NEGATIVE  NEGATIVE     Bilirubin Urine NEGATIVE  NEGATIVE     Ketones, ur NEGATIVE  NEGATIVE (mg/dL)    Protein, ur NEGATIVE  NEGATIVE (mg/dL)    Urobilinogen, UA 0.2  0.0 - 1.0 (mg/dL)    Nitrite NEGATIVE  NEGATIVE     Leukocytes, UA NEGATIVE  NEGATIVE  MICROSCOPIC NOT DONE ON URINES WITH NEGATIVE PROTEIN, BLOOD, LEUKOCYTES, NITRITE, OR GLUCOSE <1000 mg/dL.  PRO B NATRIURETIC PEPTIDE     Status: Abnormal   Collection Time   03/13/12  4:03 PM      Component Value Range Comment   Pro B Natriuretic peptide (BNP) 1505.0 (*) 0 - 450 (pg/mL)     Radiological Exams on Admission: Dg Abd Acute W/chest  03/13/2012  *RADIOLOGY REPORT*  Clinical Data: Vomiting  ACUTE ABDOMEN SERIES (ABDOMEN 2 VIEW & CHEST 1 VIEW)  Comparison: 02/05/2012 and 01/29/2012  Findings: Cardiomediastinal silhouette is stable.  No acute infiltrate or pleural effusion.  No pulmonary edema. Mitral annulus  calcifications again noted.  There is nonspecific nonobstructive bowel gas pattern.  Post cholecystectomy surgical clips are noted. No free abdominal air.  IMPRESSION: Nonspecific nonobstructive bowel gas pattern.  No free abdominal air.  No acute disease within chest.  Original Report Authenticated By: Natasha Mead, M.D.     Assessment/Plan 1-SIRS (systemic inflammatory response syndrome): most likely due to c. Diff infection; especially after recent antibiotic use. Will admit to telemetry; give fluid resuscitation; check c.diff by PCR, place on contact isolation and start empirically tx for c. Diff with IV flagyl since she is having nausea and vomiting at this point. Patient w/o hx of C. Diff in the past; and at this point do to dehydration her WBC's might be even higher. Will hold on vancomycin PO for now; but if infection setting change to worse will add it to treatment.  2-DM (diabetes mellitus): will check A1C and use SSI.  3-HTN (hypertension): with soft BP due to volume depletion; will hold antihypertensive agents for now (except for her diltiazem; which will continue due to a. Fib)  4-Hypercholesteremia: continue statins.  5-Glaucoma (increased eye pressure): continue eye drops.  6-Atrial fibrillation, rapid: continue digoxin, continue diltiazem and coumadin per pharmacy. She was restarted on 03/11/12 per [facility] cardiology. Mild spotting of blood in her loose stools this morning; will try to keep in low therapeutic range and if bleeding continue will hold. No bridging needed. Patient on low dose ASA as well.  7-Nausea vomiting and diarrhea: provide supportive care and treatment as mentioned above. Other considerations in etiology is viral gastroenteritis.   8-Depression and  Anxiety: continue home regimen; currently stable.  9-Metabolic acidosis: due to diarrhea; will provide fluid resuscitation and follow electrolytes trend.  10-DVT: on coumadin per pharmacy and SCD's since she is  subtherapeutic at this moment.   Time Spent on Admission: 50 minutes  [name] [name] [facility] [phone]  03/13/2012, 8:16 PM

## 2012-03-13 NOTE — ED Notes (Signed)
Pt states she has "not been right" since having ? Scope in Feb. Hx pancreatitis and problems with Coumadin. This morning c/o vomiting and noticed blood in either bowels or urine. Not sure which.

## 2012-03-13 NOTE — ED Notes (Signed)
Report to Zella Ball, RN on [address]

## 2012-03-13 NOTE — Progress Notes (Signed)
ANTICOAGULATION CONSULT NOTE - Initial Consult  Pharmacy Consult for warfarin Indication: atrial fibrillation  Allergies  Allergen Reactions  . Lactose Intolerance (Gi) Nausea And Vomiting    severe stomach pain  . Penicillins Hives and Itching    "haven't used any since 1970's"  . Sulfa Antibiotics Hives and Itching    "haven't used any since 1970's"    Patient Measurements: Height: 5' (152.4 cm) Weight: 122 lb 5.7 oz (55.5 kg) IBW/kg (Calculated) : 45.5   Vital Signs: Temp: 97.5 F (36.4 C) (06/09 1815) Temp src: Oral (06/09 1815) BP: 90/46 mmHg (06/09 1815) Pulse Rate: 87  (06/09 1815)  Labs:  Basename 03/13/12 1430  HGB 12.5  HCT 35.9*  PLT 279  APTT --  LABPROT 16.5*  INR 1.31  HEPARINUNFRC --  CREATININE 1.00  CKTOTAL --  CKMB --  TROPONINI <0.30    Estimated Creatinine Clearance: 35.1 ml/min (by C-G formula based on Cr of 1).   Medical History: Past Medical History  Diagnosis Date  . Pancreatitis     ~2008 and again in 11/2011 s/p ERCP  . Hypertension   . Glaucoma   . High cholesterol   . GERD (gastroesophageal reflux disease)   . H/O hiatal hernia   . Atrial fibrillation   . Dizziness - light-headed   . Pneumonia 12/2011    "first time I know about"  . Shortness of breath on exertion   . Type II diabetes mellitus   . Depression   . Chronic lower back pain     Medications:  Restarted warfarin 5mg  daily on 03/11/12  Assessment: 78 year old female who presents with chief complaint of "not feeling right", tachycardia, and tachypnea. She has had numerous issues with coumadin in the past and was restarted on coumadin by [facility] on 03/11/12.   INR subtherapeutic tonight at 1.3, which is not unreasonable after two doses. Flagyl is also being started which can potentiate the effects of her coumadin, will continue conservative dosing.  Per nursing notes patient had c/o vomiting this morning and noticed blood in either bowels or urine, no  overt bleeding is currently noted.   Goal of Therapy:  INR 2-3 Monitor platelets by anticoagulation protocol: Yes   Plan:  Patient has already taken their dose of warfarin tonight - will follow up INR in am.   Severiano Gilbert 03/13/2012,7:09 PM

## 2012-03-14 DIAGNOSIS — R112 Nausea with vomiting, unspecified: Secondary | ICD-10-CM

## 2012-03-14 DIAGNOSIS — D649 Anemia, unspecified: Secondary | ICD-10-CM

## 2012-03-14 DIAGNOSIS — R197 Diarrhea, unspecified: Secondary | ICD-10-CM

## 2012-03-14 DIAGNOSIS — R6510 Systemic inflammatory response syndrome (SIRS) of non-infectious origin without acute organ dysfunction: Secondary | ICD-10-CM

## 2012-03-14 LAB — CBC
HCT: 31.7 % — ABNORMAL LOW (ref 36.0–46.0)
Hemoglobin: 10.1 g/dL — ABNORMAL LOW (ref 12.0–15.0)
MCH: 26.2 pg (ref 26.0–34.0)
MCV: 82.3 fL (ref 78.0–100.0)
Platelets: 190 10*3/uL (ref 150–400)
RBC: 3.85 MIL/uL — ABNORMAL LOW (ref 3.87–5.11)
WBC: 13.3 10*3/uL — ABNORMAL HIGH (ref 4.0–10.5)

## 2012-03-14 LAB — BASIC METABOLIC PANEL
BUN: 24 mg/dL — ABNORMAL HIGH (ref 6–23)
CO2: 16 mEq/L — ABNORMAL LOW (ref 19–32)
CO2: 15 mEq/L — ABNORMAL LOW (ref 19–32)
Calcium: 8.3 mg/dL — ABNORMAL LOW (ref 8.4–10.5)
Calcium: 8.5 mg/dL (ref 8.4–10.5)
Chloride: 109 mEq/L (ref 96–112)
Creatinine, Ser: 0.8 mg/dL (ref 0.50–1.10)
Creatinine, Ser: 0.73 mg/dL (ref 0.50–1.10)
Glucose, Bld: 68 mg/dL — ABNORMAL LOW (ref 70–99)
Glucose, Bld: 139 mg/dL — ABNORMAL HIGH (ref 70–99)
Sodium: 137 mEq/L (ref 135–145)

## 2012-03-14 LAB — TSH: TSH: 1.077 u[IU]/mL (ref 0.350–4.500)

## 2012-03-14 LAB — LACTIC ACID, PLASMA: Lactic Acid, Venous: 1.2 mmol/L (ref 0.5–2.2)

## 2012-03-14 LAB — GLUCOSE, CAPILLARY
Glucose-Capillary: 111 mg/dL — ABNORMAL HIGH (ref 70–99)
Glucose-Capillary: 108 mg/dL — ABNORMAL HIGH (ref 70–99)

## 2012-03-14 MED ORDER — WARFARIN SODIUM 5 MG PO TABS
5.0000 mg | ORAL_TABLET | Freq: Once | ORAL | Status: AC
  Administered 2012-03-14: 5 mg via ORAL
  Filled 2012-03-14: qty 1

## 2012-03-14 MED ORDER — ATORVASTATIN CALCIUM 10 MG PO TABS
10.0000 mg | ORAL_TABLET | Freq: Every day | ORAL | Status: DC
  Administered 2012-03-14: 10 mg via ORAL
  Filled 2012-03-14 (×2): qty 1

## 2012-03-14 MED ORDER — VANCOMYCIN 50 MG/ML ORAL SOLUTION
125.0000 mg | Freq: Four times a day (QID) | ORAL | Status: DC
  Administered 2012-03-14: 125 mg via ORAL
  Filled 2012-03-14 (×4): qty 2.5

## 2012-03-14 NOTE — Progress Notes (Addendum)
Patient ID: [name] [name], female   DOB: [birth year], 78 y.o.   MRN: [id number] PATIENT DETAILS Name: [name] [name] Age: 78 y.o. Sex: female Date of Birth: 1930/01/27 Admit Date: 03/13/2012 [id number] A, MD, MD  Subjective: Patient reports she feels much improved.  Wants to eat.  Looking forward to going home.  Objective: Weight change:   Intake/Output Summary (Last 24 hours) at 03/14/12 1336 Last data filed at 03/14/12 2841  Gross per 24 hour  Intake   2645 ml  Output    400 ml  Net   2245 ml   Blood pressure 102/58, pulse 71, temperature 98.4 F (36.9 C), temperature source Oral, resp. rate 18, height 5' (1.524 m), weight 55.5 kg (122 lb 5.7 oz), SpO2 94.00%. Filed Vitals:   03/13/12 1815 03/13/12 2131 03/14/12 0507 03/14/12 1046  BP: 90/46 107/64 110/57 102/58  Pulse: 87 73 71   Temp: 97.5 F (36.4 C) 97.8 F (36.6 C) 98.4 F (36.9 C)   TempSrc: Oral Oral Oral   Resp: 20 18 18    Height: 5' (1.524 m)     Weight: 55.5 kg (122 lb 5.7 oz)     SpO2: 97% 99% 94%     Physical Exam: General: No acute distress, A&O NAD. Lungs: Clear to auscultation bilaterally without wheezes or crackles Cardiovascular: irregular rate, irregular rhythm without murmur gallop or rub normal S1 and S2 Abdomen: Nontender, nondistended, soft, bowel sounds positive Extremities: No significant cyanosis, clubbing, or edema bilateral lower extremities  Basic Metabolic Panel:  Lab 03/14/12 3244 03/14/12 0500  NA 137 138  K 3.9 3.6  CL 110 109  CO2 15* 16*  GLUCOSE 139* 68*  BUN 19 24*  CREATININE 0.73 0.80  CALCIUM 8.5 8.3*  MG -- --  PHOS -- --   Liver Function Tests:  Lab 03/13/12 1430  AST 20  ALT 17  ALKPHOS 74  BILITOT 0.3  PROT 7.2  ALBUMIN 3.5    Lab 03/13/12 1430  LIPASE 41  AMYLASE --   CBC:  Lab 03/14/12 0500 03/13/12 1430  WBC 13.3* 20.4*  NEUTROABS -- 18.0*  HGB 10.1* 12.5  HCT 31.7* 35.9*  MCV 82.3 80.7  PLT 190 279   Cardiac Enzymes:  Lab  03/13/12 1430  CKTOTAL --  CKMB --  CKMBINDEX --  TROPONINI <0.30   CBG:  Lab 03/14/12 1203 03/14/12 0810 03/13/12 1512  GLUCAP 111* 72 97   Hemoglobin A1C:  Lab 03/13/12 2034  HGBA1C 6.9*   Thyroid Function Tests:  Lab 03/13/12 2034  TSH 1.077  T4TOTAL --  FREET4 --  T3FREE --  THYROIDAB --   Coagulation:  Lab 03/13/12 1430  LABPROT 16.5*  INR 1.31    Studies/Results: Scheduled Meds:    . aspirin EC  81 mg Oral Daily  . cefOXitin      . digoxin  0.0625 mg Oral Daily  . diltiazem  240 mg Oral Daily  . insulin aspart  0-9 Units Subcutaneous TID WC  . latanoprost  1 drop Both Eyes QHS  . metronidazole  500 mg Intravenous Once  . multivitamin with minerals  1 tablet Oral Daily  . OLANZapine  5 mg Oral QHS  . sertraline  150 mg Oral Daily  . simvastatin  20 mg Oral QHS  . sodium chloride  500 mL Intravenous Once  . sodium chloride  500 mL Intravenous Once  . warfarin  5 mg Oral ONCE-1800  . Warfarin - Pharmacist Dosing Inpatient   Does  not apply q1800  . DISCONTD: sodium chloride   Intravenous STAT  . DISCONTD: aspirin  81 mg Oral Daily  . DISCONTD: ceFEPime (MAXIPIME) IV  1 g Intravenous Q12H  . DISCONTD: enoxaparin  40 mg Subcutaneous Q24H  . DISCONTD: metronidazole  500 mg Intravenous Q8H  . DISCONTD: vancomycin  125 mg Oral QID  . DISCONTD: vancomycin  1,000 mg Intravenous Once  . DISCONTD: warfarin  5 mg Oral Once   Continuous Infusions:    . sodium chloride 50 mL/hr at 03/14/12 1216   PRN Meds:.acetaminophen, acetaminophen, ALPRAZolam, morphine injection, ondansetron (ZOFRAN) IV, ondansetron, oxyCODONE, DISCONTD: ondansetron (ZOFRAN) IV  Anti-infectives:  Anti-infectives     Start     Dose/Rate Route Frequency Ordered Stop   03/14/12 1130   vancomycin (VANCOCIN) 50 mg/mL oral solution 125 mg  Status:  Discontinued        125 mg Oral 4 times daily 03/14/12 1047 03/14/12 1334   03/13/12 1930   metroNIDAZOLE (FLAGYL) IVPB 500 mg  Status:   Discontinued        500 mg 100 mL/hr over 60 Minutes Intravenous 3 times per day 03/13/12 1901 03/14/12 1048   03/13/12 1600   metroNIDAZOLE (FLAGYL) IVPB 500 mg        500 mg 100 mL/hr over 60 Minutes Intravenous  Once 03/13/12 1554 03/13/12 1700   03/13/12 1550   cefOXitin (MEFOXIN) 1 G injection     Comments: Baughman, Adrienne: cabinet override         03/13/12 1550 03/14/12 0359   03/13/12 1530   vancomycin (VANCOCIN) IVPB 1000 mg/200 mL premix  Status:  Discontinued        1,000 mg 200 mL/hr over 60 Minutes Intravenous  Once 03/13/12 1524 03/13/12 1554   03/13/12 1530   ceFEPIme (MAXIPIME) 1 g in dextrose 5 % 50 mL IVPB  Status:  Discontinued        1 g 100 mL/hr over 30 Minutes Intravenous Every 12 hours 03/13/12 1524 03/13/12 1554          Assessment/Plan: Principal Problem:  SIRS (systemic inflammatory response syndrome)  resolved. most likely due to viral infection. Nausea/Vomiting/Diarrhea Viral Syndrome.  C-diff PCR Negative.  Will discontinue all antibiotics and see how she does overnight.  N/V/D improved.  Advancing diet as tolerated.   DM (diabetes mellitus) will check A1C (6.9) and use SSI - sensitive.  Has been borderline hypoglycemic in house.   HTN (hypertension) soft BP - improved with IVF; will continue to hold antihypertensive agents for now (except for her diltiazem; which will continue due to a. Fib)   Hypercholesteremia continue statins.   Glaucoma (increased eye pressure) continue eye drops.   Atrial fibrillation continue digoxin, continue diltiazem and coumadin per pharmacy. She was restarted on 03/11/12 per [facility] cardiology. Mild spotting of blood in her loose stools this morning; will try to keep in low therapeutic range and if bleeding continue will hold. No bridging needed. Patient on low dose ASA as well.   BRBPR On coumadin and aspirin.  Monitor hgb.? Hemorrhoids aggravated by diarrhea  Depression and Anxiety continue home regimen;  currently stable.   Metabolic acidosis - not improved 6/10 over 6/9 due to diarrhea; will provide fluid resuscitation and follow electrolytes trend. Repeat bmet this afternoon to determine if patient may need bi-carb.  DVT Prophylaxis on coumadin per pharmacy and SCD's since she is subtherapeutic at this moment.    LOS: 1 day   York, Kollen Armenti L 03/14/2012,  1:36 PM [phone]  Attending -I have seen and examined the patient,I agree with the assessment and plan as outlined above. Await C Diff PCR, try to ambulate, advance diet. ? Home in am if continues to improve.  Dr Windell Norfolk

## 2012-03-14 NOTE — Progress Notes (Signed)
Physical Therapy Evaluation Note  Past Medical History  Diagnosis Date  . Pancreatitis     ~2008 and again in 11/2011 s/p ERCP  . Hypertension   . Glaucoma   . High cholesterol   . GERD (gastroesophageal reflux disease)   . H/O hiatal hernia   . Atrial fibrillation   . Dizziness - light-headed   . Pneumonia 12/2011    "first time I know about"  . Shortness of breath on exertion   . Type II diabetes mellitus   . Depression   . Chronic lower back pain     Past Surgical History  Procedure Date  . Ercp 11/13/2011    Procedure: ENDOSCOPIC RETROGRADE CHOLANGIOPANCREATOGRAPHY (ERCP);  Surgeon: Theda Belfast, MD;  Location: [facility] ENDOSCOPY;  Service: Endoscopy;  Laterality: N/A;  . Tonsillectomy and adenoidectomy ~ 1939  . Cholecystectomy 1990's  . Appendectomy ~ 1960  . Vaginal hysterectomy ~ 1960's  . Dilation and curettage of uterus   . Cataract extraction w/ intraocular lens  implant, bilateral 2000's     03/14/12 1344  PT Visit Information  Last PT Received On 03/14/12  Assistance Needed +1  PT Time Calculation  PT Start Time 1344  PT Stop Time 1413  PT Time Calculation (min) 29 min  Subjective Data  Subjective Pt received supine in bed completing sponge bath.  Precautions  Precautions Fall  Restrictions  Weight Bearing Restrictions No  Home Living  Lives With Spouse  Available Help at Discharge Family;Available 24 hours/day (husband avail 24/7)  Type of Home House  Home Access Stairs to enter;Ramped entrance  Entrance Stairs-Number of Steps 4  Entrance Stairs-Rails Right  Home Layout One level  Bathroom Shower/Tub Tub/shower unit  Bathroom Toilet Handicapped height  Bathroom Accessibility Yes  How Accessible Accessible via walker  Home Adaptive Equipment Bedside commode/3-in-1;Walker - rolling;Walker - four wheeled;Shower chair with back  Prior Function  Level of Independence Needs assistance  Needs Assistance (pt uses RW, husband helps minimaly with ADLs)    Able to Take Stairs? Yes  Driving No  Vocation Retired  Geneticist, molecular No difficulties  Cognition  Overall Cognitive Status Appears within functional limits for tasks assessed/performed  Arousal/Alertness Awake/alert  Orientation Level Oriented X4 / Intact  Behavior During Session [facility] for tasks performed  Right Upper Extremity Assessment  RUE ROM/Strength/Tone WFL  Left Upper Extremity Assessment  LUE ROM/Strength/Tone WFL  Right Lower Extremity Assessment  RLE ROM/Strength/Tone WFL  Left Lower Extremity Assessment  LLE ROM/Strength/Tone WFL  Trunk Assessment  Trunk Assessment Normal  Bed Mobility  Bed Mobility Supine to Sit  Supine to Sit 5: Supervision  Transfers  Transfers Sit to Stand;Stand to Sit  Sit to Stand 5: Supervision  Stand to Sit 5: Supervision  Details for Transfer Assistance v/c's for hand placement  Ambulation/Gait  Ambulation/Gait Assistance 4: Min guard  Ambulation Distance (Feet) 150 Feet  Assistive device Rolling walker  Ambulation/Gait Assistance Details pt with smooth cadence, safe walker use  Gait Pattern Step-through pattern  Gait velocity wfl  Stairs Yes  Stairs Assistance 4: Min assist  Stairs Assistance Details (indicate cue type and reason) forward with R handrail  Stair Management Technique One rail Right;Forwards  Number of Stairs 3   PT - End of Session  Equipment Utilized During Treatment Gait belt  Activity Tolerance Patient tolerated treatment well  Patient left in chair;with call bell/phone within reach  Nurse Communication Mobility status  PT Assessment  Clinical Impression Statement Pt with nausea/vomitting.  Patient safe to return home with spouse and HHPT. Patient requires use of RW for safe ambulation due to mild unsteadiness without AD. Case manager  notified.  PT Recommendation/Assessment Patient needs continued PT services  PT Problem List Decreased strength;Decreased activity tolerance  Barriers to Discharge  None  PT Therapy Diagnosis  Generalized weakness  PT Plan  PT Frequency Min 2X/week  PT Treatment/Interventions Gait training;Stair training;Functional mobility training;Therapeutic activities;Therapeutic exercise  PT Recommendation  Follow Up Recommendations Home health PT;Supervision/Assistance - 24 hour  Equipment Recommended None recommended by PT  Individuals Consulted  Consulted and Agree with Results and Recommendations Patient  Acute Rehab PT Goals  PT Goal Formulation With patient  Time For Goal Achievement 03/21/12  Potential to Achieve Goals Good  Pt will go Supine/Side to Sit Independently;with HOB 0 degrees  PT Goal: Supine/Side to Sit - Progress Goal set today  Pt will go Sit to Stand Independently;with upper extremity assist (up to RW>)  PT Goal: Sit to Stand - Progress Goal set today  Pt will Ambulate >150 feet;with modified independence;with rolling walker  PT Goal: Ambulate - Progress Goal set today  Pt will Go Up / Down Stairs 3-5 stairs;with supervision;with rail(s)  PT Goal: Up/Down Stairs - Progress Goal set today  Pt will Perform Home Exercise Program Independently  PT Goal: Perform Home Exercise Program - Progress Goal set today  PT General Charges  $$ ACUTE PT VISIT 1 Procedure  PT Evaluation  $Initial PT Evaluation Tier II 1 Procedure  Written Expression  Dominant Hand Right     Pain: Patient denies any pain at this time.  Lewis Shock, PT, DPT Pager #: [phone] Office #: [phone]

## 2012-03-14 NOTE — Progress Notes (Signed)
ANTICOAGULATION CONSULT NOTE - Initial Consult  Pharmacy Consult for warfarin Indication: atrial fibrillation  Allergies  Allergen Reactions  . Lactose Intolerance (Gi) Nausea And Vomiting    severe stomach pain  . Penicillins Hives and Itching    "haven't used any since 1970's"  . Sulfa Antibiotics Hives and Itching    "haven't used any since 1970's"    Patient Measurements: Height: 5' (152.4 cm) Weight: 122 lb 5.7 oz (55.5 kg) IBW/kg (Calculated) : 45.5   Vital Signs: Temp: 98.4 F (36.9 C) (06/10 0507) Temp src: Oral (06/10 0507) BP: 102/58 mmHg (06/10 1046) Pulse Rate: 71  (06/10 0507)  Labs:  Basename 03/14/12 0500 03/13/12 1430  HGB 10.1* 12.5  HCT 31.7* 35.9*  PLT 190 279  APTT -- --  LABPROT -- 16.5*  INR -- 1.31  HEPARINUNFRC -- --  CREATININE 0.80 1.00  CKTOTAL -- --  CKMB -- --  TROPONINI -- <0.30    Estimated Creatinine Clearance: 43.8 ml/min (by C-G formula based on Cr of 0.8).   Medical History: Past Medical History  Diagnosis Date  . Pancreatitis     ~2008 and again in 11/2011 s/p ERCP  . Hypertension   . Glaucoma   . High cholesterol   . GERD (gastroesophageal reflux disease)   . H/O hiatal hernia   . Atrial fibrillation   . Dizziness - light-headed   . Pneumonia 12/2011    "first time I know about"  . Shortness of breath on exertion   . Type II diabetes mellitus   . Depression   . Chronic lower back pain     Medications:  Restarted warfarin 5mg  daily on 03/11/12  Assessment: 78 year old female who presents with chief complaint of "not feeling right", tachycardia, and tachypnea. She has had numerous issues with coumadin in the past and was restarted on coumadin by [facility] on 03/11/12.   INR subtherapeutic yesterday at 1.3, no INR drawn today. Flagyl was d/c'd and changed to vanco therefore no drug interactions.  Goal of Therapy:  INR 2-3 Monitor platelets by anticoagulation protocol: Yes   Plan:  Coumadin 5mg  today and  f/u in am.  Verlene Mayer, PharmD, BCPS Pager [phone] 03/14/2012,10:58 AM

## 2012-03-15 ENCOUNTER — Telehealth: Payer: Self-pay

## 2012-03-15 DIAGNOSIS — R112 Nausea with vomiting, unspecified: Secondary | ICD-10-CM

## 2012-03-15 DIAGNOSIS — R6510 Systemic inflammatory response syndrome (SIRS) of non-infectious origin without acute organ dysfunction: Secondary | ICD-10-CM

## 2012-03-15 DIAGNOSIS — D649 Anemia, unspecified: Secondary | ICD-10-CM

## 2012-03-15 DIAGNOSIS — R197 Diarrhea, unspecified: Secondary | ICD-10-CM

## 2012-03-15 LAB — URINE CULTURE

## 2012-03-15 LAB — BASIC METABOLIC PANEL
CO2: 19 mEq/L (ref 19–32)
Chloride: 112 mEq/L (ref 96–112)
Glucose, Bld: 131 mg/dL — ABNORMAL HIGH (ref 70–99)
Potassium: 3.9 mEq/L (ref 3.5–5.1)
Sodium: 142 mEq/L (ref 135–145)

## 2012-03-15 LAB — CBC
Hemoglobin: 10.1 g/dL — ABNORMAL LOW (ref 12.0–15.0)
MCH: 27.1 pg (ref 26.0–34.0)
Platelets: 181 10*3/uL (ref 150–400)
RBC: 3.73 MIL/uL — ABNORMAL LOW (ref 3.87–5.11)
WBC: 12.1 10*3/uL — ABNORMAL HIGH (ref 4.0–10.5)

## 2012-03-15 LAB — PROTIME-INR
INR: 1.72 — ABNORMAL HIGH (ref 0.00–1.49)
Prothrombin Time: 20.5 seconds — ABNORMAL HIGH (ref 11.6–15.2)

## 2012-03-15 LAB — GLUCOSE, CAPILLARY: Glucose-Capillary: 127 mg/dL — ABNORMAL HIGH (ref 70–99)

## 2012-03-15 MED ORDER — LISINOPRIL 5 MG PO TABS: 5.0000 mg | ORAL_TABLET | Freq: Every day | ORAL | Status: DC

## 2012-03-15 NOTE — Progress Notes (Signed)
ANTICOAGULATION CONSULT NOTE - Initial Consult  Pharmacy Consult for warfarin Indication: atrial fibrillation  Allergies  Allergen Reactions  . Lactose Intolerance (Gi) Nausea And Vomiting    severe stomach pain  . Penicillins Hives and Itching    "haven't used any since 1970's"  . Sulfa Antibiotics Hives and Itching    "haven't used any since 1970's"    Patient Measurements: Height: 5' (152.4 cm) Weight: 122 lb 14.4 oz (55.747 kg) IBW/kg (Calculated) : 45.5   Vital Signs: Temp: 97.7 F (36.5 C) (06/11 0535) Temp src: Oral (06/11 0535) BP: 113/63 mmHg (06/11 0945) Pulse Rate: 103  (06/11 0945)  Labs:  Basename 03/15/12 0630 03/14/12 1117 03/14/12 0500 03/13/12 1430  HGB 10.1* -- 10.1* --  HCT 31.0* -- 31.7* 35.9*  PLT 181 -- 190 279  APTT -- -- -- --  LABPROT 20.5* -- -- 16.5*  INR 1.72* -- -- 1.31  HEPARINUNFRC -- -- -- --  CREATININE 0.78 0.73 0.80 --  CKTOTAL -- -- -- --  CKMB -- -- -- --  TROPONINI -- -- -- <0.30    Estimated Creatinine Clearance: 43.9 ml/min (by C-G formula based on Cr of 0.78).   Medical History: Past Medical History  Diagnosis Date  . Pancreatitis     ~2008 and again in 11/2011 s/p ERCP  . Hypertension   . Glaucoma   . High cholesterol   . GERD (gastroesophageal reflux disease)   . H/O hiatal hernia   . Atrial fibrillation   . Dizziness - light-headed   . Pneumonia 12/2011    "first time I know about"  . Shortness of breath on exertion   . Type II diabetes mellitus   . Depression   . Chronic lower back pain     Medications:  Restarted warfarin 5mg  daily on 03/11/12  Assessment: 78 year old female who presents with chief complaint of "not feeling right", tachycardia, and tachypnea. She has had numerous issues with coumadin in the past and was restarted on coumadin by [facility] on 03/11/12.   INR subtherapeutic today but trend up.  Goal of Therapy:  INR 2-3 Monitor platelets by anticoagulation protocol: Yes   Plan:    D/c home today, recommend resuming home dose 5mg  daily.  Verlene Mayer, PharmD, BCPS Pager [phone] 03/15/2012,10:47 AM

## 2012-03-15 NOTE — Care Management Note (Signed)
    Page 1 of 1   03/15/2012     10:42:49 AM   CARE MANAGEMENT NOTE 03/15/2012  Patient:  [name] [name], [name] [name]   Account Number:  [id number]  Date Initiated:  03/15/2012  Documentation initiated by:  [name]  Subjective/Objective Assessment:   dx sirs  admit- lives with spouse, patient is active with [facility] for [facility], PT, OT.     Action/Plan:   Anticipated DC Date:  03/15/2012   Anticipated DC Plan:  HOME W HOME HEALTH SERVICES      DC Planning Services  CM consult      [facility] Choice  Resumption Of Svcs/PTA Provider   Choice offered to / List presented to:  C-1 Patient        HH arranged  HH-1 RN  HH-2 PT  HH-3 OT      [facility] agency  [facility]   Status of service:  Completed, signed off Medicare Important Message given?   (If response is "NO", the following Medicare IM given date fields will be blank) Date Medicare IM given:   Date Additional Medicare IM given:    Discharge Disposition:  HOME W HOME HEALTH SERVICES  Per UR Regulation:  Reviewed for med. necessity/level of care/duration of stay  If discussed at Long Length of Stay Meetings, dates discussed:    Comments:  03/15/12 10:40 [name] RN, BSN [phone] patient lives with spouse, patient is active with [facility]. Patient has medication coverage and transportation. Patient would like to continue to stay with [facility] for [facility], PT, OT,  referral made to [facility] , Marie notified.  Patient for dc todlay, Soc will begin 24-48 hrs post discharge.

## 2012-03-15 NOTE — Discharge Summary (Signed)
Patient ID: [name] [name] MRN: [id number] DOB/AGE: 05/11/1930 78 y.o.  Admit date: 03/13/2012 Discharge date: 03/15/2012  Primary Care Physician:  [name] Hearing, MD, MD  Discharge Diagnoses:   Present on Admission:  Principal Problem:  *SIRS (systemic inflammatory response syndrome)  * Nausea vomiting and diarrhea likely viral  * one episode of Bright Red Blood Per Rectum on coumadin.  Active Problems:  Normocytic Anemia  DM (diabetes mellitus)  HTN (hypertension)  Hypercholesteremia  Glaucoma (increased eye pressure)  Atrial fibrillation, rapid   Depression  Anxiety   Medication List  As of 03/15/2012 10:56 AM   STOP taking these medications         cephALEXin 500 MG capsule         TAKE these medications         ALPRAZolam 1 MG tablet   Commonly known as: XANAX   Take 0.5 mg by mouth 4 (four) times daily as needed. Sleep/anxiety.      aspirin 81 MG tablet   Take 81 mg by mouth daily.      CENTRUM SILVER PO   Take 1 tablet by mouth daily.      digoxin 0.125 MG tablet   Commonly known as: LANOXIN   Take 0.0625-0.125 mcg by mouth daily. Take 1 tablet on Tuesday,Thursday,Saturday, & Sunday  Take 0.5 tablet on all other days      diltiazem 240 MG 24 hr capsule   Commonly known as: CARDIZEM CD   Take 1 capsule (240 mg total) by mouth daily.      furosemide 40 MG tablet   Commonly known as: LASIX   Take 40 mg by mouth every Monday, Wednesday, and Friday.      glimepiride 4 MG tablet   Commonly known as: AMARYL   Take 2-4 mg by mouth See admin instructions. 1/2 tab in am and 1/2 tab in pm      HYDROcodone-acetaminophen 5-500 MG per tablet   Commonly known as: VICODIN   Take 1 tablet by mouth every 6 (six) hours as needed.      hyoscyamine 0.125 MG tablet   Commonly known as: LEVSIN, ANASPAZ   Take 0.125 mg by mouth every 4 (four) hours as needed. For abdominal pain      lisinopril 5 MG tablet   Commonly known as: PRINIVIL,ZESTRIL   Take 1 tablet  (5 mg total) by mouth daily. Hold off until after seeing your primary care physician in follow up.      metoprolol tartrate 25 MG tablet   Commonly known as: LOPRESSOR   Take 12.5 mg by mouth 2 (two) times daily.      OLANZapine 5 MG tablet   Commonly known as: ZYPREXA   Take 5 mg by mouth at bedtime.      omeprazole 20 MG capsule   Commonly known as: PRILOSEC   Take 20 mg by mouth 2 (two) times daily.      sertraline 100 MG tablet   Commonly known as: ZOLOFT   Take 150 mg by mouth daily.      simvastatin 20 MG tablet   Commonly known as: ZOCOR   Take 20 mg by mouth every evening.      warfarin 5 MG tablet   Commonly known as: COUMADIN   Take 1 tablet (5 mg total) by mouth daily.      XALATAN OP   Place 1 drop into both eyes at bedtime.  Consults:   Brief H and P: From the admission note:  78 y/o female with PMH significant for HTN, Glaucoma, Atrial fibrillation (on coumadin), HLD and recent hospitalization due to pancreatitis with short nursing home placement for physical rehab; who came to the ED complaining of nausea, vomiting, diarrhea and GI upset symptoms for the last 24-36 hours. Patient endorses recently finished course of keflex for UTI. Denies CP, HA, fever, chills or hematemesis. Patient reports some blood tinged stool this morning along with her diarrhea.  In the ED she was found to be borderline hypotensive, with elevated WBC's (up to 20,000); dehydrated and mild hypothermic. [facility] was called to admit patient for SIRS.  1.  Systemic Inflammatory Response Syndrome.   Despite an very high WBC, [name] [name] improved significantly overnight with IV hydration and IV antibiotics.    2.  Nausea, Vomiting, and Diarrhea.  C-diff PCR negative.  The morning after her admission, the patients nausea and vomiting had resolved and she wanted to eat.  She was already looking forward to going home.   The patient's Bi-carb was noted to be low (15 - 16) but returned to  normal with IV Fluids.  The patient has chronic diarrhea.  When the  C-diff PCR came back negative all antibiotics were discontinued (symptoms were assumed to be viral).  She was monitored for another day and continued to improve off of the antibiotics.  3.  Diabetes Mellitus.  Hgb A1c is 6.9.  Her cbgs were not elevated during this admission.  She was maintained on a minimal amount of sliding scale insulin.  Continue outpatient glipizide.  4.  Hypertension.  The patient's BP was soft during this admission.  Hypertensives were held except diltiazem.  At this time her BP is returning to normal.  We will restart her metoprolol and lasix, but continue to hold her Lisinopril until she is evaluated by her primary care physician in hospital follow up.  5.  Bright Red Blood Per Rectum.  The patient described an episode of bleeding just prior to admission.  This was concerning as she was recently restarted on coumadin and takes a daily 81 mg aspirin.  She had no further episodes of bleeding in the hospital.  Hbg at discharge is stable at 10.1.  MCV 82.3 (normocytic anemia)  6.  The patient's other co-morbidities (atrial fib, depression and anxiety, glaucoma, hypercholesteremia) were quiet and stable during this hospitalization.   Physical Exam on Discharge: General: Alert, awake, oriented x3, in no acute distress. HEENT: No bruits, no goiter. Heart: Regular rate and rhythm, without murmurs, rubs, gallops. Lungs: Clear to auscultation bilaterally. Abdomen: Soft, nontender, nondistended, positive bowel sounds. Extremities: No clubbing cyanosis or edema with positive pedal pulses. Neuro: Grossly intact, nonfocal.  Filed Vitals:   03/14/12 1527 03/14/12 2225 03/15/12 0535 03/15/12 0945  BP: 102/56 121/64 120/66 113/63  Pulse: 77 92 85 103  Temp: 98.1 F (36.7 C) 98.1 F (36.7 C) 97.7 F (36.5 C)   TempSrc: Oral Oral Oral   Resp: 20 20 18    Height:      Weight:   55.747 kg (122 lb 14.4 oz)   SpO2:  97% 96% 96%      Intake/Output Summary (Last 24 hours) at 03/15/12 1056 Last data filed at 03/15/12 0600  Gross per 24 hour  Intake   1475 ml  Output      0 ml  Net   1475 ml    Basic Metabolic Panel:  Lab 03/15/12  0630 03/14/12 1117  NA 142 137  K 3.9 3.9  CL 112 110  CO2 19 15*  GLUCOSE 131* 139*  BUN 12 19  CREATININE 0.78 0.73  CALCIUM 8.8 8.5  MG -- --  PHOS -- --   Liver Function Tests:  Lab 03/13/12 1430  AST 20  ALT 17  ALKPHOS 74  BILITOT 0.3  PROT 7.2  ALBUMIN 3.5    Lab 03/13/12 1430  LIPASE 41  AMYLASE --   CBC:  Lab 03/15/12 0630 03/14/12 0500 03/13/12 1430  WBC 12.1* 13.3* --  NEUTROABS -- -- 18.0*  HGB 10.1* 10.1* --  HCT 31.0* 31.7* --  MCV 83.1 82.3 --  PLT 181 190 --   Cardiac Enzymes:  Lab 03/13/12 1430  CKTOTAL --  CKMB --  CKMBINDEX --  TROPONINI <0.30   CBG:  Lab 03/15/12 0807 03/14/12 1710 03/14/12 1203 03/14/12 0810 03/13/12 1512  GLUCAP 133* 108* 111* 72 97   Hemoglobin A1C:  Lab 03/13/12 2034  HGBA1C 6.9*   Thyroid Function Tests:  Lab 03/13/12 2034  TSH 1.077  T4TOTAL --  FREET4 --  T3FREE --  THYROIDAB --   Coagulation:  Lab 03/15/12 0630 03/13/12 1430  LABPROT 20.5* 16.5*  INR 1.72* 1.31     Significant Diagnostic Studies:  Dg Abd Acute W/chest  03/13/2012  *RADIOLOGY REPORT*  Clinical Data: Vomiting  ACUTE ABDOMEN SERIES (ABDOMEN 2 VIEW & CHEST 1 VIEW)  Comparison: 02/05/2012 and 01/29/2012  Findings: Cardiomediastinal silhouette is stable.  No acute infiltrate or pleural effusion.  No pulmonary edema. Mitral annulus calcifications again noted.  There is nonspecific nonobstructive bowel gas pattern.  Post cholecystectomy surgical clips are noted. No free abdominal air.  IMPRESSION: Nonspecific nonobstructive bowel gas pattern.  No free abdominal air.  No acute disease within chest.  Original Report Authenticated By: Natasha Mead, M.D.      Disposition and Follow-up: stable for discharge to home in  the care of her husband with PCP follow up.  Discharge Orders    Future Appointments: Provider: Department: Dept Phone: Center:   03/25/2012 11:00 AM [facility] [place] [phone] [facility]   05/18/2012 12:00 PM Rollene Rotunda, MD [facility] [phone] [facility]     Future Orders Please Complete By Expires   Diet - low sodium heart healthy      Increase activity slowly      Discharge instructions      Comments:   During hospital follow up please check:  1.  INR 2.  BP - Lisinopril has been held due to soft BP in hospital 3.  CBC, BMET     Follow-up Information    Follow up with ROSTAND,ROBERT A, MD. Schedule an appointment as soon as possible for a visit in 1 week.   Contact information:   [address] [place]dal[place] [address] [phone]    Please check Patient's cbc, bmet and INR, blood pressure in one week.       Time spent on Discharge:  40 min.  SignedStephani Police 03/15/2012, 10:56 AM [phone]  Attending I have seen and examined the patient, agree with the assessment and plan as outlined above.She is symptomatically much better. She is stable to be discharged today. She has tolerated a regular diet as well. She does have some chronic diarrhea and currently her BM's are back to her usual baseline.   Windell Norfolk MD

## 2012-03-15 NOTE — Telephone Encounter (Signed)
Spoke with pt's husband pt started on 5mg  Coumadin daily 03/12/12, became sick on 6/9 went to hospital discharged on 6/11.  Pt has received Coumadin 5mg  daily since 03/12/12 and INR on 6/11 is 1.72.  Consulted with Weston Brass, PharmD and instructed pt's husband to have pt take 2.5mg  today, then come into clinic for INR recheck on 03/16/12.

## 2012-03-15 NOTE — Progress Notes (Signed)
Patient discharge teaching given, including activity, diet, follow-up appoints, and medications. Patient verbalized understanding of all discharge instructions. IV access was d/c'd. Vitals are stable. Skin is intact. Pt to be escorted out by NT, to be driven home by husband.  

## 2012-03-17 ENCOUNTER — Ambulatory Visit (INDEPENDENT_AMBULATORY_CARE_PROVIDER_SITE_OTHER): Payer: Medicare Other | Admitting: *Deleted

## 2012-03-17 DIAGNOSIS — I4891 Unspecified atrial fibrillation: Secondary | ICD-10-CM

## 2012-03-17 DIAGNOSIS — Z7901 Long term (current) use of anticoagulants: Secondary | ICD-10-CM | POA: Insufficient documentation

## 2012-03-17 DIAGNOSIS — R002 Palpitations: Secondary | ICD-10-CM

## 2012-03-17 LAB — POCT INR: INR: 2.1

## 2012-03-17 NOTE — Patient Instructions (Addendum)
A full discussion of the nature of anticoagulants has been carried out.  A benefit risk analysis has been presented to the patient, so that they understand the justification for choosing anticoagulation at this time. The need for frequent and regular monitoring, precise dosage adjustment and compliance is stressed.  Side effects of potential bleeding are discussed.  The patient should avoid any OTC items containing aspirin or ibuprofen, and should avoid great swings in general diet.  Avoid alcohol consumption.  Call if any signs of abnormal bleeding.   [phone]. Provided patient list of foods with vitamin k with explanation of consistency. Provided pt coumadin teaching booklet. Pt and husband state they did see video in hospital.

## 2012-03-23 ENCOUNTER — Telehealth: Payer: Self-pay | Admitting: Cardiology

## 2012-03-23 MED ORDER — DIGOXIN 125 MCG PO TABS: ORAL_TABLET | ORAL | Status: DC

## 2012-03-23 NOTE — Telephone Encounter (Signed)
Husband calling needing a RX for pt's Digoxin to be sent into pharmacy.  RX to be sent.  Reviewed upcoming appts with husband for Friday

## 2012-03-23 NOTE — Telephone Encounter (Signed)
Patient husband request return call to discuss patient medication, he can be reached at [phone].

## 2012-03-25 ENCOUNTER — Ambulatory Visit (INDEPENDENT_AMBULATORY_CARE_PROVIDER_SITE_OTHER): Payer: Medicare Other | Admitting: Pharmacist

## 2012-03-25 ENCOUNTER — Other Ambulatory Visit (INDEPENDENT_AMBULATORY_CARE_PROVIDER_SITE_OTHER): Payer: Medicare Other

## 2012-03-25 ENCOUNTER — Encounter (INDEPENDENT_AMBULATORY_CARE_PROVIDER_SITE_OTHER): Payer: Medicare Other

## 2012-03-25 DIAGNOSIS — R002 Palpitations: Secondary | ICD-10-CM

## 2012-03-25 DIAGNOSIS — Z7901 Long term (current) use of anticoagulants: Secondary | ICD-10-CM

## 2012-03-25 DIAGNOSIS — I4891 Unspecified atrial fibrillation: Secondary | ICD-10-CM

## 2012-03-25 LAB — CBC WITH DIFFERENTIAL/PLATELET
Basophils Absolute: 0.1 10*3/uL (ref 0.0–0.1)
Basophils Relative: 0.5 % (ref 0.0–3.0)
Eosinophils Absolute: 0.4 10*3/uL (ref 0.0–0.7)
Lymphocytes Relative: 28.7 % (ref 12.0–46.0)
MCHC: 33.4 g/dL (ref 30.0–36.0)
MCV: 81.9 fl (ref 78.0–100.0)
Monocytes Absolute: 0.5 10*3/uL (ref 0.1–1.0)
Neutro Abs: 6.4 10*3/uL (ref 1.4–7.7)
Neutrophils Relative %: 61.7 % (ref 43.0–77.0)
RDW: 15.5 % — ABNORMAL HIGH (ref 11.5–14.6)

## 2012-03-28 ENCOUNTER — Telehealth: Payer: Self-pay | Admitting: Cardiology

## 2012-03-28 ENCOUNTER — Other Ambulatory Visit: Payer: Self-pay | Admitting: Cardiology

## 2012-03-28 NOTE — Telephone Encounter (Signed)
Please return call to patient husband Rosanne Ashing  [phone] cell or [phone] home  Rosanne Ashing is concerned about patient medication changes with rehab doctor.   Please return call to patient husband at hm# or Cell#.

## 2012-03-28 NOTE — Telephone Encounter (Signed)
..   Requested Prescriptions   Pending Prescriptions Disp Refills  . CARTIA XT 240 MG 24 hr capsule [Pharmacy Med Name: CARTIA  (DILTIAZEM) 240MG  XT CAPS] 30 capsule 11    Sig: TAKE ONE CAPSULE BY MOUTH DAILY   Signed Prescriptions Disp Refills  . furosemide (LASIX) 40 MG tablet 12 tablet 11    Sig: TAKE 1 TABLET BY MOUTH ON MONDAYS, WEDNESDAYS, AND FRIDAYS    Authorizing Provider: Rollene Rotunda    Ordering User: Zareen Jamison M  . metoprolol tartrate (LOPRESSOR) 25 MG tablet 30 tablet 11    Sig: TAKE 1/2 TABLET BY MOUTH TWICE DAILY    Authorizing Provider: Rollene Rotunda    Ordering User: Christella Hartigan, Neka Bise Judie Petit

## 2012-03-29 NOTE — Telephone Encounter (Signed)
NA at either number.  Will continue to attempt to contact

## 2012-04-01 ENCOUNTER — Ambulatory Visit: Payer: Medicare Other | Admitting: Cardiology

## 2012-04-01 ENCOUNTER — Ambulatory Visit (INDEPENDENT_AMBULATORY_CARE_PROVIDER_SITE_OTHER): Payer: Medicare Other | Admitting: *Deleted

## 2012-04-01 DIAGNOSIS — I4891 Unspecified atrial fibrillation: Secondary | ICD-10-CM

## 2012-04-01 DIAGNOSIS — R002 Palpitations: Secondary | ICD-10-CM

## 2012-04-01 DIAGNOSIS — Z7901 Long term (current) use of anticoagulants: Secondary | ICD-10-CM

## 2012-04-01 NOTE — Telephone Encounter (Signed)
Spoke with Rosanne Ashing (husband) who states the medication question have been answered.  Reviewed most recent lab with him as well.

## 2012-04-08 ENCOUNTER — Ambulatory Visit (INDEPENDENT_AMBULATORY_CARE_PROVIDER_SITE_OTHER): Payer: Medicare Other | Admitting: *Deleted

## 2012-04-08 DIAGNOSIS — Z7901 Long term (current) use of anticoagulants: Secondary | ICD-10-CM

## 2012-04-08 DIAGNOSIS — I4891 Unspecified atrial fibrillation: Secondary | ICD-10-CM

## 2012-04-08 DIAGNOSIS — R002 Palpitations: Secondary | ICD-10-CM

## 2012-04-08 LAB — POCT INR: INR: 5.1

## 2012-04-11 ENCOUNTER — Other Ambulatory Visit: Payer: Self-pay

## 2012-04-11 ENCOUNTER — Encounter (HOSPITAL_COMMUNITY): Payer: Self-pay

## 2012-04-11 ENCOUNTER — Emergency Department (HOSPITAL_COMMUNITY): Payer: Medicare Other

## 2012-04-11 ENCOUNTER — Inpatient Hospital Stay (HOSPITAL_COMMUNITY)
Admission: EM | Admit: 2012-04-11 | Discharge: 2012-04-18 | DRG: 871 | Disposition: A | Discharge status: Skilled Nursing Facility | Payer: Medicare Other | Attending: Internal Medicine | Admitting: Internal Medicine

## 2012-04-11 DIAGNOSIS — J9600 Acute respiratory failure, unspecified whether with hypoxia or hypercapnia: Secondary | ICD-10-CM | POA: Diagnosis not present

## 2012-04-11 DIAGNOSIS — R509 Fever, unspecified: Secondary | ICD-10-CM

## 2012-04-11 DIAGNOSIS — I4891 Unspecified atrial fibrillation: Secondary | ICD-10-CM

## 2012-04-11 DIAGNOSIS — F419 Anxiety disorder, unspecified: Secondary | ICD-10-CM

## 2012-04-11 DIAGNOSIS — G8929 Other chronic pain: Secondary | ICD-10-CM

## 2012-04-11 DIAGNOSIS — A419 Sepsis, unspecified organism: Principal | ICD-10-CM | POA: Diagnosis present

## 2012-04-11 DIAGNOSIS — J189 Pneumonia, unspecified organism: Secondary | ICD-10-CM

## 2012-04-11 DIAGNOSIS — J4489 Other specified chronic obstructive pulmonary disease: Secondary | ICD-10-CM | POA: Diagnosis present

## 2012-04-11 DIAGNOSIS — A498 Other bacterial infections of unspecified site: Secondary | ICD-10-CM | POA: Diagnosis present

## 2012-04-11 DIAGNOSIS — D72829 Elevated white blood cell count, unspecified: Secondary | ICD-10-CM

## 2012-04-11 DIAGNOSIS — N390 Urinary tract infection, site not specified: Secondary | ICD-10-CM | POA: Diagnosis present

## 2012-04-11 DIAGNOSIS — E7800 Pure hypercholesterolemia, unspecified: Secondary | ICD-10-CM

## 2012-04-11 DIAGNOSIS — R6510 Systemic inflammatory response syndrome (SIRS) of non-infectious origin without acute organ dysfunction: Secondary | ICD-10-CM

## 2012-04-11 DIAGNOSIS — I10 Essential (primary) hypertension: Secondary | ICD-10-CM

## 2012-04-11 DIAGNOSIS — J449 Chronic obstructive pulmonary disease, unspecified: Secondary | ICD-10-CM | POA: Diagnosis present

## 2012-04-11 DIAGNOSIS — D689 Coagulation defect, unspecified: Secondary | ICD-10-CM

## 2012-04-11 DIAGNOSIS — E860 Dehydration: Secondary | ICD-10-CM | POA: Diagnosis present

## 2012-04-11 DIAGNOSIS — K922 Gastrointestinal hemorrhage, unspecified: Secondary | ICD-10-CM

## 2012-04-11 DIAGNOSIS — I340 Nonrheumatic mitral (valve) insufficiency: Secondary | ICD-10-CM

## 2012-04-11 DIAGNOSIS — F411 Generalized anxiety disorder: Secondary | ICD-10-CM

## 2012-04-11 DIAGNOSIS — R000 Tachycardia, unspecified: Secondary | ICD-10-CM | POA: Diagnosis present

## 2012-04-11 DIAGNOSIS — E876 Hypokalemia: Secondary | ICD-10-CM

## 2012-04-11 DIAGNOSIS — M549 Dorsalgia, unspecified: Secondary | ICD-10-CM | POA: Diagnosis present

## 2012-04-11 DIAGNOSIS — J9601 Acute respiratory failure with hypoxia: Secondary | ICD-10-CM | POA: Diagnosis not present

## 2012-04-11 DIAGNOSIS — E119 Type 2 diabetes mellitus without complications: Secondary | ICD-10-CM | POA: Diagnosis present

## 2012-04-11 DIAGNOSIS — R197 Diarrhea, unspecified: Secondary | ICD-10-CM | POA: Diagnosis present

## 2012-04-11 DIAGNOSIS — R002 Palpitations: Secondary | ICD-10-CM

## 2012-04-11 DIAGNOSIS — K589 Irritable bowel syndrome without diarrhea: Secondary | ICD-10-CM | POA: Diagnosis present

## 2012-04-11 DIAGNOSIS — K529 Noninfective gastroenteritis and colitis, unspecified: Secondary | ICD-10-CM

## 2012-04-11 DIAGNOSIS — H409 Unspecified glaucoma: Secondary | ICD-10-CM

## 2012-04-11 DIAGNOSIS — I959 Hypotension, unspecified: Secondary | ICD-10-CM | POA: Diagnosis present

## 2012-04-11 DIAGNOSIS — G9341 Metabolic encephalopathy: Secondary | ICD-10-CM | POA: Diagnosis not present

## 2012-04-11 DIAGNOSIS — D649 Anemia, unspecified: Secondary | ICD-10-CM

## 2012-04-11 DIAGNOSIS — Z7901 Long term (current) use of anticoagulants: Secondary | ICD-10-CM

## 2012-04-11 DIAGNOSIS — F329 Major depressive disorder, single episode, unspecified: Secondary | ICD-10-CM

## 2012-04-11 DIAGNOSIS — E871 Hypo-osmolality and hyponatremia: Secondary | ICD-10-CM

## 2012-04-11 DIAGNOSIS — K859 Acute pancreatitis without necrosis or infection, unspecified: Secondary | ICD-10-CM

## 2012-04-11 DIAGNOSIS — R4182 Altered mental status, unspecified: Secondary | ICD-10-CM

## 2012-04-11 DIAGNOSIS — R112 Nausea with vomiting, unspecified: Secondary | ICD-10-CM

## 2012-04-11 LAB — COMPREHENSIVE METABOLIC PANEL
ALT: 10 U/L (ref 0–35)
AST: 20 U/L (ref 0–37)
Albumin: 3.1 g/dL — ABNORMAL LOW (ref 3.5–5.2)
CO2: 22 mEq/L (ref 19–32)
Calcium: 8.8 mg/dL (ref 8.4–10.5)
Chloride: 100 mEq/L (ref 96–112)
Creatinine, Ser: 0.92 mg/dL (ref 0.50–1.10)
GFR calc non Af Amer: 57 mL/min — ABNORMAL LOW (ref >90)
Sodium: 137 mEq/L (ref 135–145)

## 2012-04-11 LAB — PRO B NATRIURETIC PEPTIDE: Pro B Natriuretic peptide (BNP): 4175 pg/mL — ABNORMAL HIGH (ref 0–450)

## 2012-04-11 LAB — URINALYSIS, ROUTINE W REFLEX MICROSCOPIC
Bilirubin Urine: NEGATIVE
Glucose, UA: NEGATIVE mg/dL
Specific Gravity, Urine: 1.014 (ref 1.005–1.030)
Urobilinogen, UA: 0.2 mg/dL (ref 0.0–1.0)
pH: 6 (ref 5.0–8.0)

## 2012-04-11 LAB — PROCALCITONIN: Procalcitonin: 1.42 ng/mL

## 2012-04-11 LAB — DIGOXIN LEVEL: Digoxin Level: 0.6 ng/mL — ABNORMAL LOW (ref 0.8–2.0)

## 2012-04-11 LAB — RETICULOCYTES
RBC.: 3.67 MIL/uL — ABNORMAL LOW (ref 3.87–5.11)
Retic Count, Absolute: 77.1 10*3/uL (ref 19.0–186.0)

## 2012-04-11 LAB — CBC WITH DIFFERENTIAL/PLATELET
Eosinophils Absolute: 0 10*3/uL (ref 0.0–0.7)
HCT: 30.3 % — ABNORMAL LOW (ref 36.0–46.0)
Hemoglobin: 10 g/dL — ABNORMAL LOW (ref 12.0–15.0)
Lymphs Abs: 1.2 10*3/uL (ref 0.7–4.0)
MCH: 27.2 pg (ref 26.0–34.0)
MCV: 82.6 fL (ref 78.0–100.0)
Monocytes Absolute: 0.9 10*3/uL (ref 0.1–1.0)
Monocytes Relative: 6 % (ref 3–12)
Neutrophils Relative %: 86 % — ABNORMAL HIGH (ref 43–77)
RBC: 3.67 MIL/uL — ABNORMAL LOW (ref 3.87–5.11)

## 2012-04-11 LAB — GLUCOSE, CAPILLARY
Glucose-Capillary: 199 mg/dL — ABNORMAL HIGH (ref 70–99)
Glucose-Capillary: 145 mg/dL — ABNORMAL HIGH (ref 70–99)

## 2012-04-11 LAB — PROTIME-INR: INR: 1.5 — ABNORMAL HIGH (ref 0.00–1.49)

## 2012-04-11 LAB — LACTIC ACID, PLASMA: Lactic Acid, Venous: 1.3 mmol/L (ref 0.5–2.2)

## 2012-04-11 LAB — URINE MICROSCOPIC-ADD ON

## 2012-04-11 MED ORDER — SODIUM CHLORIDE 0.9 % IJ SOLN
3.0000 mL | Freq: Two times a day (BID) | INTRAMUSCULAR | Status: DC
  Administered 2012-04-12: 3 mL via INTRAVENOUS

## 2012-04-11 MED ORDER — ALBUTEROL SULFATE (5 MG/ML) 0.5% IN NEBU: 2.5000 mg | INHALATION_SOLUTION | RESPIRATORY_TRACT | Status: DC | PRN

## 2012-04-11 MED ORDER — POTASSIUM CHLORIDE 10 MEQ/100ML IV SOLN
INTRAVENOUS | Status: AC
  Administered 2012-04-11: 10 meq via INTRAVENOUS
  Filled 2012-04-11: qty 100

## 2012-04-11 MED ORDER — DIGOXIN 0.25 MG/ML IJ SOLN
0.1250 mg | Freq: Once | INTRAMUSCULAR | Status: AC
  Administered 2012-04-11: 0.5 mg via INTRAVENOUS
  Filled 2012-04-11: qty 2

## 2012-04-11 MED ORDER — DIGOXIN 0.0625 MG HALF TABLET: 62.5000 ug | ORAL_TABLET | Freq: Every day | ORAL | Status: DC

## 2012-04-11 MED ORDER — MEROPENEM 1 G IV SOLR
1.0000 g | INTRAVENOUS | Status: AC
  Administered 2012-04-11: 1 g via INTRAVENOUS
  Filled 2012-04-11: qty 1

## 2012-04-11 MED ORDER — ACETAMINOPHEN 325 MG PO TABS
650.0000 mg | ORAL_TABLET | Freq: Four times a day (QID) | ORAL | Status: DC | PRN
  Administered 2012-04-12: 650 mg via ORAL
  Filled 2012-04-11 (×2): qty 2

## 2012-04-11 MED ORDER — PANTOPRAZOLE SODIUM 40 MG PO TBEC
40.0000 mg | DELAYED_RELEASE_TABLET | Freq: Every day | ORAL | Status: DC
  Administered 2012-04-11 – 2012-04-18 (×7): 40 mg via ORAL
  Filled 2012-04-11 (×8): qty 1

## 2012-04-11 MED ORDER — CIPROFLOXACIN IN D5W 400 MG/200ML IV SOLN: 400.0000 mg | Freq: Once | INTRAVENOUS | Status: DC

## 2012-04-11 MED ORDER — SODIUM CHLORIDE 0.9 % IV SOLN
1.0000 g | Freq: Two times a day (BID) | INTRAVENOUS | Status: DC
  Administered 2012-04-12 – 2012-04-13 (×4): 1 g via INTRAVENOUS
  Filled 2012-04-11 (×5): qty 1

## 2012-04-11 MED ORDER — METOPROLOL TARTRATE 1 MG/ML IV SOLN: 5.0000 mg | INTRAVENOUS | Status: DC | PRN

## 2012-04-11 MED ORDER — DIGOXIN 0.0625 MG HALF TABLET
0.0625 mg | ORAL_TABLET | ORAL | Status: DC
  Administered 2012-04-13 – 2012-04-18 (×3): 0.0625 mg via ORAL
  Filled 2012-04-11 (×3): qty 1

## 2012-04-11 MED ORDER — ONDANSETRON HCL 4 MG PO TABS: 4.0000 mg | ORAL_TABLET | Freq: Four times a day (QID) | ORAL | Status: DC | PRN

## 2012-04-11 MED ORDER — DILTIAZEM LOAD VIA INFUSION
10.0000 mg | Freq: Once | INTRAVENOUS | Status: AC
  Administered 2012-04-11: 10 mg via INTRAVENOUS
  Filled 2012-04-11: qty 10

## 2012-04-11 MED ORDER — SIMVASTATIN 40 MG PO TABS
40.0000 mg | ORAL_TABLET | Freq: Every evening | ORAL | Status: DC
  Administered 2012-04-11: 40 mg via ORAL
  Filled 2012-04-11 (×2): qty 1

## 2012-04-11 MED ORDER — METOPROLOL TARTRATE 12.5 MG HALF TABLET
12.5000 mg | ORAL_TABLET | Freq: Two times a day (BID) | ORAL | Status: DC
  Administered 2012-04-11 – 2012-04-16 (×10): 12.5 mg via ORAL
  Filled 2012-04-11 (×11): qty 1

## 2012-04-11 MED ORDER — WARFARIN - PHARMACIST DOSING INPATIENT
Freq: Every day | Status: DC
  Administered 2012-04-11: 18:00:00

## 2012-04-11 MED ORDER — SODIUM CHLORIDE 0.9 % IV SOLN
Freq: Once | INTRAVENOUS | Status: AC
  Administered 2012-04-11: 13:00:00 via INTRAVENOUS

## 2012-04-11 MED ORDER — ACETAMINOPHEN 325 MG PO TABS
650.0000 mg | ORAL_TABLET | Freq: Once | ORAL | Status: AC
  Administered 2012-04-11: 650 mg via ORAL
  Filled 2012-04-11: qty 2

## 2012-04-11 MED ORDER — DILTIAZEM HCL 100 MG IV SOLR
5.0000 mg/h | INTRAVENOUS | Status: DC
  Administered 2012-04-11: 10 mg/h via INTRAVENOUS
  Administered 2012-04-11: 5 mg/h via INTRAVENOUS
  Administered 2012-04-12 – 2012-04-14 (×6): 10 mg/h via INTRAVENOUS
  Filled 2012-04-11: qty 100

## 2012-04-11 MED ORDER — HYDROCODONE-ACETAMINOPHEN 5-325 MG PO TABS
1.0000 | ORAL_TABLET | ORAL | Status: DC | PRN
  Administered 2012-04-12 – 2012-04-15 (×3): 1 via ORAL
  Filled 2012-04-11 (×3): qty 1

## 2012-04-11 MED ORDER — WARFARIN SODIUM 2.5 MG PO TABS
2.5000 mg | ORAL_TABLET | Freq: Once | ORAL | Status: AC
  Administered 2012-04-11: 2.5 mg via ORAL
  Filled 2012-04-11 (×3): qty 1

## 2012-04-11 MED ORDER — GUAIFENESIN-DM 100-10 MG/5ML PO SYRP: 5.0000 mL | ORAL_SOLUTION | ORAL | Status: DC | PRN

## 2012-04-11 MED ORDER — SODIUM CHLORIDE 0.9 % IV SOLN: INTRAVENOUS | Status: DC

## 2012-04-11 MED ORDER — POTASSIUM CHLORIDE 10 MEQ/100ML IV SOLN
10.0000 meq | INTRAVENOUS | Status: AC
  Administered 2012-04-11 (×3): 10 meq via INTRAVENOUS
  Filled 2012-04-11: qty 100

## 2012-04-11 MED ORDER — INSULIN ASPART 100 UNIT/ML ~~LOC~~ SOLN
0.0000 [IU] | SUBCUTANEOUS | Status: DC
  Administered 2012-04-11: 1 [IU] via SUBCUTANEOUS
  Administered 2012-04-12 (×2): 2 [IU] via SUBCUTANEOUS
  Administered 2012-04-13: 3 [IU] via SUBCUTANEOUS
  Administered 2012-04-13: 5 [IU] via SUBCUTANEOUS
  Administered 2012-04-13: 3 [IU] via SUBCUTANEOUS
  Administered 2012-04-13: 5 [IU] via SUBCUTANEOUS
  Administered 2012-04-13 – 2012-04-14 (×2): 2 [IU] via SUBCUTANEOUS
  Administered 2012-04-14: 3 [IU] via SUBCUTANEOUS
  Administered 2012-04-14: 1 [IU] via SUBCUTANEOUS

## 2012-04-11 MED ORDER — ONDANSETRON HCL 4 MG/2ML IJ SOLN
4.0000 mg | Freq: Four times a day (QID) | INTRAMUSCULAR | Status: DC | PRN
  Administered 2012-04-12: 4 mg via INTRAVENOUS
  Filled 2012-04-11: qty 2

## 2012-04-11 MED ORDER — SODIUM CHLORIDE 0.9 % IV SOLN
INTRAVENOUS | Status: DC
  Administered 2012-04-11 – 2012-04-14 (×5): via INTRAVENOUS

## 2012-04-11 MED ORDER — OLANZAPINE 5 MG PO TABS
5.0000 mg | ORAL_TABLET | Freq: Every day | ORAL | Status: DC
  Administered 2012-04-11 – 2012-04-17 (×7): 5 mg via ORAL
  Filled 2012-04-11 (×8): qty 1

## 2012-04-11 MED ORDER — DIGOXIN 125 MCG PO TABS
0.1250 mg | ORAL_TABLET | ORAL | Status: DC
  Administered 2012-04-12 – 2012-04-17 (×4): 0.125 mg via ORAL
  Filled 2012-04-11 (×4): qty 1

## 2012-04-11 MED ORDER — SODIUM CHLORIDE 0.9 % IV SOLN
INTRAVENOUS | Status: AC
  Administered 2012-04-11: 16:00:00 via INTRAVENOUS

## 2012-04-11 MED ORDER — POTASSIUM CHLORIDE 10 MEQ/100ML IV SOLN
INTRAVENOUS | Status: AC
  Filled 2012-04-11: qty 100

## 2012-04-11 NOTE — ED Notes (Signed)
Patient is resting comfortably. 

## 2012-04-11 NOTE — ED Notes (Signed)
Pt was transported in through EMS. Per EMS pt family states she has had increase weakness and slurred speech in the last 2 days. Per ems family states they assumed that patient had a fever last night because she was hot to touch. Per ems pt denies pain, nausea dizziness. Pt is unaware if she took any morning medications.

## 2012-04-11 NOTE — Progress Notes (Signed)
ANTIBIOTIC/ANTICOAGULATION CONSULT NOTE - INITIAL  Pharmacy Consult for Meropenem, Coumadin Indication: rule out sepsis; UTI; hx afib  Allergies  Allergen Reactions  . Lactose Intolerance (Gi) Nausea And Vomiting    severe stomach pain  . Penicillins Hives and Itching    "haven't used any since 1970's"  . Sulfa Antibiotics Hives and Itching    "haven't used any since 1970's"    Patient Measurements:   Adjusted Body Weight: 54.5 kg  Vital Signs: Temp: 99 F (37.2 C) (07/08 1454) Temp src: Oral (07/08 1454) BP: 142/79 mmHg (07/08 1454) Pulse Rate: 117  (07/08 1454) Intake/Output from previous day:   Intake/Output from this shift:    Labs:  Basename 04/11/12 1116  WBC 16.1*  HGB 10.0*  PLT 195  LABCREA --  CREATININE 0.92   The CrCl is unknown because both a height and weight (above a minimum accepted value) are required for this calculation. No results found for this basename: VANCOTROUGH:2,VANCOPEAK:2,VANCORANDOM:2,GENTTROUGH:2,GENTPEAK:2,GENTRANDOM:2,TOBRATROUGH:2,TOBRAPEAK:2,TOBRARND:2,AMIKACINPEAK:2,AMIKACINTROU:2,AMIKACIN:2, in the last 72 hours  Lab Results  Component Value Date   INR 1.50* 04/11/2012   INR 5.1 04/08/2012   INR 2.8 04/01/2012    Microbiology: Recent Results (from the past 720 hour(s))  URINE CULTURE     Status: Normal   Collection Time   03/13/12  3:13 PM      Component Value Range Status Comment   Specimen Description URINE, CATHETERIZED   Final    Special Requests NONE   Final    Culture  Setup Time [id number]   Final    Colony Count NO GROWTH   Final    Culture NO GROWTH   Final    Report Status 03/15/2012 FINAL   Final   CLOSTRIDIUM DIFFICILE BY PCR     Status: Normal   Collection Time   03/14/12  9:46 AM      Component Value Range Status Comment   C difficile by pcr NEGATIVE  NEGATIVE Final     Medical History: Past Medical History  Diagnosis Date  . Pancreatitis     ~2008 and again in 11/2011 s/p ERCP  . Hypertension   .  Glaucoma   . High cholesterol   . GERD (gastroesophageal reflux disease)   . H/O hiatal hernia   . Atrial fibrillation   . Dizziness - light-headed   . Pneumonia 12/2011    "first time I know about"  . Shortness of breath on exertion   . Type II diabetes mellitus   . Depression   . Chronic lower back pain     Assessment: 78 yo female with complicated medical history admitted with progressive weakness, lethargy, and fever.  MD suspects early sepsis due to UTI.  Pharmacy asked to begin empiric meropenem since patient is penicillin allergic.  CrCl is borderline for Meropenem dose adjustment.  Est. CrCl ~ 47 ml/min. Pt also with hx of afib, on chronic anticoagulation with Coumadin.  INR is subtherapeutic today, although noted to be supratherapeutic on 04/08/12.  Per pt's husband, due to high INR, patient was holding last night's (7/7) Coumadin dose, and was supposed to hold tonight's dose as well.  They were then supposed to take 2.5 mg daily except 5 mg on Thursdays.  Goal of Therapy:  Resolution of UTI INR 2-3  Plan:  1. Meropenem 1g IV q12 hrs for now.  Will monitor renal function. 2. Coumadin 2.5 mg po x 1 tonight. 3. Daily PT/INR.  Woodrow Dulski C 04/11/2012,2:56 PM

## 2012-04-11 NOTE — ED Notes (Signed)
Spoke with pharmacy will send two medication to [facility] via the tube system.

## 2012-04-11 NOTE — Progress Notes (Addendum)
Patient admitted to 2920, lethargic, afib on the monitor, no complaints of pain.  Heart rate 145, Cardizem drip infusing, MD paged.  Will continue to monitor.  [name] [name]

## 2012-04-11 NOTE — ED Notes (Signed)
Pt is currently back in the room

## 2012-04-11 NOTE — ED Notes (Signed)
Patient husband states last night patient has not slept restless.

## 2012-04-11 NOTE — ED Notes (Signed)
Pt is currently in radiology. 

## 2012-04-11 NOTE — ED Notes (Signed)
Pt took medication w/o difficultly swallowing, pt awake, answering following questions appropriately.

## 2012-04-11 NOTE — ED Notes (Signed)
Urine collected by in and out by Jill Alexanders EMT and Tammy Sours RN Patient tolerated without incident.

## 2012-04-11 NOTE — H&P (Signed)
[facility]                                                                                    Patient Demographics  [name] [name], is a 78 y.o. female  CSN: [id number]  MRN: [id number]  DOB - January 19, 1930  Admit Date - 04/11/2012  Outpatient Primary MD for the patient is Cecile Hearing, MD   With History of -  Past Medical History  Diagnosis Date  . Pancreatitis     ~2008 and again in 11/2011 s/p ERCP  . Hypertension   . Glaucoma   . High cholesterol   . GERD (gastroesophageal reflux disease)   . H/O hiatal hernia   . Atrial fibrillation   . Dizziness - light-headed   . Pneumonia 12/2011    "first time I know about"  . Shortness of breath on exertion   . Type II diabetes mellitus   . Depression   . Chronic lower back pain       Past Surgical History  Procedure Date  . Ercp 11/13/2011    Procedure: ENDOSCOPIC RETROGRADE CHOLANGIOPANCREATOGRAPHY (ERCP);  Surgeon: Theda Belfast, MD;  Location: [facility] ENDOSCOPY;  Service: Endoscopy;  Laterality: N/A;  . Tonsillectomy and adenoidectomy ~ 1939  . Cholecystectomy 1990's  . Appendectomy ~ 1960  . Vaginal hysterectomy ~ 1960's  . Dilation and curettage of uterus   . Cataract extraction w/ intraocular lens  implant, bilateral 2000's    in for   Chief Complaint  Patient presents with  . Fatigue  . Fever     HPI  [name] [name]  is a 78 y.o. female, with history of hypertension, atrial fibrillation, COPD, chronic back pain, recent pancreatitis, who recently moved to her house from a skilled nursing facility is brought in by her husband as for the last 3-4 days she has showed gradually progressive weakness and lethargy, last night she started to run fevers, she denied any chest pain cough or abdominal pain, she was brought in the ER where she was found to be in early sepsis secondary to UTI, atrial fibrillation with RVR, dry oral mucosa and evidence of dehydration, I was called to admit the  patient.    Review of Systems  opens her eyes and answers questions  In addition to the HPI above,   Positive Fever-chills, No Headache, No changes with Vision or hearing, No problems swallowing food or Liquids, No Chest pain, Cough or Shortness of Breath, No Abdominal pain, No Nausea or Vommitting, Bowel movements are regular, No Blood in stool or Urine, No dysuria, No new skin rashes or bruises, No new joints pains-aches,  No new weakness, tingling, numbness in any extremity, No recent weight gain or loss, No polyuria, polydypsia or polyphagia, No significant Mental Stressors.  A full 10 point Review of Systems was done, except as stated above, all other Review of Systems were negative.   Social History History  Substance Use Topics  . Smoking status: Never Smoker   . Smokeless tobacco: Never Used  . Alcohol Use: No      Family History Family History  Problem Relation Age of Onset  . Anesthesia problems Neg  Hx   . Hypotension Neg Hx   . Malignant hyperthermia Neg Hx   . Pseudochol deficiency Neg Hx   . Stroke Father 67  . Stroke Mother 90      Prior to Admission medications   Medication Sig Start Date End Date Taking? Authorizing Provider  ALPRAZolam Prudy Feeler) 1 MG tablet Take 0.5 mg by mouth 4 (four) times daily as needed. For sleep or anxiety.   Yes Historical Provider, MD  digoxin (LANOXIN) 0.125 MG tablet Take 62.5-125 mcg by mouth daily. Take on Tuesdays, Thursdays, Saturdays, and Sundays.  Take 62. on all other days.   Yes Historical Provider, MD  diltiazem (CARDIZEM CD) 240 MG 24 hr capsule Take 240 mg by mouth daily.   Yes Historical Provider, MD  furosemide (LASIX) 40 MG tablet Take 40 mg by mouth 3 (three) times a week. Take 1 tablet on Mondays, Wednesdays, and Saturdays.   Yes Historical Provider, MD  glimepiride (AMARYL) 4 MG tablet Take 4 mg by mouth 2 (two) times daily.   Yes Historical Provider, MD  latanoprost (XALATAN) 0.005 % ophthalmic  solution Place 1 drop into both eyes at bedtime.   Yes Historical Provider, MD  metoprolol tartrate (LOPRESSOR) 25 MG tablet Take 12.5 mg by mouth 2 (two) times daily.   Yes Historical Provider, MD  OLANZapine (ZYPREXA) 5 MG tablet Take 5 mg by mouth at bedtime.   Yes Historical Provider, MD  omeprazole (PRILOSEC) 20 MG capsule Take 20 mg by mouth 2 (two) times daily.    Yes Historical Provider, MD  simvastatin (ZOCOR) 40 MG tablet Take 40 mg by mouth every evening.   Yes Historical Provider, MD  warfarin (COUMADIN) 5 MG tablet Take 5 mg by mouth daily.   Yes Historical Provider, MD    Allergies  Allergen Reactions  . Lactose Intolerance (Gi) Nausea And Vomiting    severe stomach pain  . Penicillins Hives and Itching    "haven't used any since 1970's"  . Sulfa Antibiotics Hives and Itching    "haven't used any since 1970's"    Physical Exam  Vitals  Blood pressure 102/51, pulse 106, temperature 98.5 F (36.9 C), temperature source Oral, resp. rate 22, SpO2 99.00%.   1. General frail elderly white female lying in bed in NAD but looks extremely tired and wasted  2. Normal affect and insight, Not Suicidal or Homicidal, Awake not alert,  3. No F.N deficits, ALL C.Nerves Intact, Strength 5/5 all 4 extremities, Sensation intact all 4 extremities, Plantars down going.  4. Ears and Eyes appear Normal, Conjunctivae clear, PERRLA. Dry Oral Mucosa.  5. Supple Neck, No JVD, No cervical lymphadenopathy appriciated, No Carotid Bruits.  6. Symmetrical Chest wall movement, Good air movement bilaterally, CTAB.  7. RRR, No Gallops, Rubs or Murmurs, No Parasternal Heave.  8. Positive Bowel Sounds, Abdomen Soft, Non tender, No organomegaly appriciated,No rebound -guarding or rigidity.  9.  No Cyanosis, Normal Skin Turgor, No Skin Rash or Bruise.  10. Good muscle tone,  joints appear normal , no effusions, Normal ROM.  11. No Palpable Lymph Nodes in Neck or Axillae     Data  Review  CBC  Lab 04/11/12 1116  WBC 16.1*  HGB 10.0*  HCT 30.3*  PLT 195  MCV 82.6  MCH 27.2  MCHC 33.0  RDW 15.3  LYMPHSABS 1.2  MONOABS 0.9  EOSABS 0.0  BASOSABS 0.0  BANDABS --   ------------------------------------------------------------------------------------------------------------------  Chemistries   Lab 04/11/12 1116  NA  137  K 3.3*  CL 100  CO2 22  GLUCOSE 183*  BUN 14  CREATININE 0.92  CALCIUM 8.8  MG --  AST 20  ALT 10  ALKPHOS 68  BILITOT 0.8   ------------------------------------------------------------------------------------------------------------------ CrCl is unknown because both a height and weight (above a minimum accepted value) are required for this calculation. ------------------------------------------------------------------------------------------------------------------ No results found for this basename: TSH,T4TOTAL,FREET3,T3FREE,THYROIDAB in the last 72 hours   Coagulation profile  Lab 04/11/12 1116 04/08/12 1309  INR 1.50* 5.1  PROTIME -- --   -------------------------------------------------------------------------------------------------------------------  Alvira Philips 04/11/12 1116  DDIMER <0.22   -------------------------------------------------------------------------------------------------------------------  Cardiac Enzymes No results found for this basename: CK:3,CKMB:3,TROPONINI:3,MYOGLOBIN:3 in the last 168 hours ------------------------------------------------------------------------------------------------------------------ No components found with this basename: POCBNP:3   ---------------------------------------------------------------------------------------------------------------  Urinalysis    Component Value Date/Time   COLORURINE YELLOW 04/11/2012 1156   APPEARANCEUR CLOUDY* 04/11/2012 1156   LABSPEC 1.014 04/11/2012 1156   PHURINE 6.0 04/11/2012 1156   GLUCOSEU NEGATIVE 04/11/2012 1156   HGBUR MODERATE*  04/11/2012 1156   BILIRUBINUR NEGATIVE 04/11/2012 1156   KETONESUR NEGATIVE 04/11/2012 1156   PROTEINUR 30* 04/11/2012 1156   UROBILINOGEN 0.2 04/11/2012 1156   NITRITE POSITIVE* 04/11/2012 1156   LEUKOCYTESUR LARGE* 04/11/2012 1156      Imaging results:   Dg Chest 2 View  04/11/2012  *RADIOLOGY REPORT*  Clinical Data: Fever, shortness of breath, weakness  CHEST - 2 VIEW  Comparison: 01/29/2012  Findings: Decreased lung volumes. Minimal enlargement of cardiac silhouette. Mitral annular calcification noted. Mildly tortuous thoracic aorta with atherosclerotic calcification. Chronic accentuation of interstitial markings little changed. No acute infiltrate, pleural effusion or pneumothorax. Bones appear diffusely demineralized.  IMPRESSION: Chronic interstitial lung disease changes. No definite acute abnormalities. Minimal enlargement of cardiac silhouette.  Original Report Authenticated By: Lollie Marrow, M.D.   Ct Head Wo Contrast  04/11/2012  *RADIOLOGY REPORT*  Clinical Data: Fever.  Slurred speech.  CT HEAD WITHOUT CONTRAST  Technique:  Contiguous axial images were obtained from the base of the skull through the vertex without contrast.  Comparison: Head CT scan 11/29/2011.  Findings: Chronic microvascular ischemic change and atrophy are again seen.  No evidence of acute abnormality including infarction, hemorrhage, mass lesion, mass effect, midline shift or abnormal extra-axial fluid collection.  No hydrocephalus or pneumocephalus. Calvarium intact.  IMPRESSION: No acute finding.  Original Report Authenticated By: Bernadene Bell. D'ALESSIO, M.D.   Dg Abd Acute W/chest  03/13/2012  *RADIOLOGY REPORT*  Clinical Data: Vomiting  ACUTE ABDOMEN SERIES (ABDOMEN 2 VIEW & CHEST 1 VIEW)  Comparison: 02/05/2012 and 01/29/2012  Findings: Cardiomediastinal silhouette is stable.  No acute infiltrate or pleural effusion.  No pulmonary edema. Mitral annulus calcifications again noted.  There is nonspecific nonobstructive bowel gas  pattern.  Post cholecystectomy surgical clips are noted. No free abdominal air.  IMPRESSION: Nonspecific nonobstructive bowel gas pattern.  No free abdominal air.  No acute disease within chest.  Original Report Authenticated By: Natasha Mead, M.D.    My personal review of EKG: Rhythm Afib, Rate  131 /min,  Non specific ST changes     Assessment & Plan  1.SIRs vs early sepsis due to UTI- patient will be admitted to step down, she has evidence of dehydration as she has dry oral mucosa is slightly hypotensive and tachycardic with A. fib with RVR, blood cultures urine cultures have been ordered, empiric IV Zosyn, IV fluid bolus and then maintenance IV fluid, her digoxin level is low she will get an extra IV dose of  digoxin x1, Cardizem drip for A. fib with RVR.   2. A. fib with RVR- pressure has improved after IV fluid bolus, digoxin level is 0.6, will give her 1 time dose of IV dig, IV Cardizem drip per protocol. IV fluids for hydration as above. Continue Coumadin with daily INR pharmacy to monitor.   3. Lethargy and weakness- n.p.o. except ice chips and medications as tolerated, aspiration precautions, speech evaluation.   4. Diabetes mellitus- no oral hypoglycemics every 4 hour Accu-Cheks with low-dose sliding scale as tolerated.   5. History of pancreatitis- no abdominal pain, liver enzymes are stable, outpatient GI monitoring.   DVT Prophylaxis Coumadin - SCDs   AM Labs Ordered, also please review Full Orders  Admission, patients condition and plan of care including tests being ordered have been discussed with the patient and husband who indicate understanding and agree with the plan and Code Status.  Code Status DNR  Condition GUARDED   Leroy Sea M.D on 04/11/2012 at 2:38 PM  Between 7am to 7pm - Pager - [phone]  After 7pm go to [email or web address] - password [email or web address]  And look for the night coverage person covering me after hours  [facility] Office   [phone]

## 2012-04-11 NOTE — ED Provider Notes (Signed)
History     CSN: [id number]  Arrival date & time 04/11/12  0930   First MD Initiated Contact with Patient 04/11/12 0940      Chief Complaint  Patient presents with  . Fatigue  . Fever    (Consider location/radiation/quality/duration/timing/severity/associated sxs/prior treatment) HPI Comments: The patient is an 78 year old woman who says she can't walk. She was able to walk yesterday. She has a high fever, and her speech seemed slurred. It is unclear when the pt was last seen normal. She has no prior history of stroke.There is a prior history of diabetes, atrial fibrillation, pancreatitis. She had been hospitalized 5 times since February of this past year. She had been hospitalized for systemic inflammatory response syndrome on 03/13/12.  Patient is a 78 y.o. female presenting with fever. The history is provided by the patient and medical records. No language interpreter was used.  Fever Primary symptoms of the febrile illness include fever and fatigue. Primary symptoms do not include cough, nausea, vomiting or diarrhea. The current episode started today. This is a new problem. The problem has not changed since onset. The fever began today. The fever has been unchanged since its onset. The maximum temperature recorded prior to her arrival was 103 to 104 F. The temperature was taken by a rectal thermometer.  Risk factors for febrile illness include diabetes mellitus.Primary symptoms comment: Slurred speech, unable to walk.      Past Medical History  Diagnosis Date  . Pancreatitis     ~2008 and again in 11/2011 s/p ERCP  . Hypertension   . Glaucoma   . High cholesterol   . GERD (gastroesophageal reflux disease)   . H/O hiatal hernia   . Atrial fibrillation   . Dizziness - light-headed   . Pneumonia 12/2011    "first time I know about"  . Shortness of breath on exertion   . Type II diabetes mellitus   . Depression   . Chronic lower back pain     Past Surgical History  Procedure  Date  . Ercp 11/13/2011    Procedure: ENDOSCOPIC RETROGRADE CHOLANGIOPANCREATOGRAPHY (ERCP);  Surgeon: Theda Belfast, MD;  Location: [facility] ENDOSCOPY;  Service: Endoscopy;  Laterality: N/A;  . Tonsillectomy and adenoidectomy ~ 1939  . Cholecystectomy 1990's  . Appendectomy ~ 1960  . Vaginal hysterectomy ~ 1960's  . Dilation and curettage of uterus   . Cataract extraction w/ intraocular lens  implant, bilateral 2000's    Family History  Problem Relation Age of Onset  . Anesthesia problems Neg Hx   . Hypotension Neg Hx   . Malignant hyperthermia Neg Hx   . Pseudochol deficiency Neg Hx   . Stroke Father 65  . Stroke Mother 65    History  Substance Use Topics  . Smoking status: Never Smoker   . Smokeless tobacco: Never Used  . Alcohol Use: No    OB History    Grav Para Term Preterm Abortions TAB SAB Ect Mult Living                  Review of Systems  Constitutional: Positive for fever and fatigue.  HENT: Negative.   Eyes: Negative.   Respiratory: Negative for cough.   Cardiovascular: Negative.   Gastrointestinal: Negative.  Negative for nausea, vomiting and diarrhea.  Genitourinary: Negative.  Negative for frequency, flank pain and difficulty urinating.  Musculoskeletal: Negative.   Neurological: Positive for speech difficulty.  Psychiatric/Behavioral: Negative.     Allergies  Lactose intolerance (gi);  Penicillins; and Sulfa antibiotics  Home Medications   Current Outpatient Rx  Name Route Sig Dispense Refill  . ALPRAZOLAM 1 MG PO TABS Oral Take 0.5 mg by mouth 4 (four) times daily as needed. Sleep/anxiety.    Marland Kitchen CARTIA XT 240 MG PO CP24  TAKE ONE CAPSULE BY MOUTH DAILY 30 capsule 11  . DIGOXIN 0.125 MG PO TABS  Take one tablet on Tuesday, Thursday, Saturday and Sunday take 1/2 tablet all other days 30 tablet 6  . FUROSEMIDE 40 MG PO TABS  TAKE 1 TABLET BY MOUTH ON MONDAYS, WEDNESDAYS, AND FRIDAYS 12 tablet 11  . GLIMEPIRIDE 4 MG PO TABS Oral Take 2-4 mg by mouth See  admin instructions. 1/2 tab in am and 1/2 tab in pm    . HYDROCODONE-ACETAMINOPHEN 5-500 MG PO TABS Oral Take 1 tablet by mouth every 6 (six) hours as needed.    Marland Kitchen HYOSCYAMINE SULFATE 0.125 MG PO TABS Oral Take 0.125 mg by mouth every 4 (four) hours as needed. For abdominal pain    . XALATAN OP Both Eyes Place 1 drop into both eyes at bedtime.     . METHYLPHENIDATE HCL 5 MG PO TABS Oral Take 5 mg by mouth 2 (two) times daily.    Marland Kitchen METOPROLOL TARTRATE 25 MG PO TABS  TAKE 1/2 TABLET BY MOUTH TWICE DAILY 30 tablet 11  . CENTRUM SILVER PO Oral Take 1 tablet by mouth daily.    Marland Kitchen OLANZAPINE 5 MG PO TABS Oral Take 5 mg by mouth at bedtime.    . OMEPRAZOLE 20 MG PO CPDR Oral Take 20 mg by mouth 2 (two) times daily.     . SERTRALINE HCL 100 MG PO TABS Oral Take 150 mg by mouth daily.     Marland Kitchen SIMVASTATIN 20 MG PO TABS Oral Take 20 mg by mouth every evening.    . WARFARIN SODIUM 5 MG PO TABS Oral Take 1 tablet (5 mg total) by mouth daily. 30 tablet 3    BP 132/59  Pulse 120  Temp 103 F (39.4 C) (Oral)  Resp 31  SpO2 97%  Physical Exam  Nursing note and vitals reviewed. Constitutional: She appears well-developed and well-nourished.       T 103, P 131, R 31, BP 132/59, O2 sat 97%.  Her speech is somewhat slurred.   HENT:  Head: Normocephalic and atraumatic.  Right Ear: External ear normal.  Left Ear: External ear normal.  Mouth/Throat: Oropharynx is clear and moist.  Eyes: Conjunctivae and EOM are normal. Pupils are equal, round, and reactive to light.  Neck: Normal range of motion. Neck supple. No JVD present.       No carotid bruit.  Cardiovascular: Normal heart sounds.        P 131, AF on monitor.  No murmur.  Pulmonary/Chest: Effort normal and breath sounds normal.  Abdominal: Soft. Bowel sounds are normal. She exhibits no distension. There is no tenderness.  Musculoskeletal: Normal range of motion.       Trace ankle edema bilaterally.  Skin: Skin is warm and dry.  Psychiatric: She has  a normal mood and affect. Her behavior is normal.    ED Course  Procedures (including critical care time)  9:43 AM  Date: 04/11/2012  Rate: 131  Rhythm: atrial fibrillation and premature ventricular contractions (PVC)  QRS Axis: left  Intervals: normal QRS: low voltage in frontal leads.  ST/T Wave abnormalities: ST depressions inferiorly and ST depressions laterally  Conduction Disutrbances:none  Narrative Interpretation: Abnormal EKG  Old EKG Reviewed: changes noted--Rate is more rapid today 131 vs 93 on March 13, 2012.  10:17 AM Patient was seen and had physical examination. Laboratory tests were done. IV fluids were ordered. There was concern that patient could be septic.  11:43 AM CT of head negative.  Chest x-ray shows chronic interstitial changes, no acute changes.  1:39 PM Results for orders placed during the hospital encounter of 04/11/12  GLUCOSE, CAPILLARY      Component Value Range   Glucose-Capillary 199 (*) 70 - 99 mg/dL   Comment 1 Documented in Chart     Comment 2 Notify RN    CBC WITH DIFFERENTIAL      Component Value Range   WBC 16.1 (*) 4.0 - 10.5 K/uL   RBC 3.67 (*) 3.87 - 5.11 MIL/uL   Hemoglobin 10.0 (*) 12.0 - 15.0 g/dL   HCT 16.1 (*) 09.6 - 04.5 %   MCV 82.6  78.0 - 100.0 fL   MCH 27.2  26.0 - 34.0 pg   MCHC 33.0  30.0 - 36.0 g/dL   RDW 40.9  81.1 - 91.4 %   Platelets 195  150 - 400 K/uL   Neutrophils Relative 86 (*) 43 - 77 %   Neutro Abs 13.9 (*) 1.7 - 7.7 K/uL   Lymphocytes Relative 8 (*) 12 - 46 %   Lymphs Abs 1.2  0.7 - 4.0 K/uL   Monocytes Relative 6  3 - 12 %   Monocytes Absolute 0.9  0.1 - 1.0 K/uL   Eosinophils Relative 0  0 - 5 %   Eosinophils Absolute 0.0  0.0 - 0.7 K/uL   Basophils Relative 0  0 - 1 %   Basophils Absolute 0.0  0.0 - 0.1 K/uL  COMPREHENSIVE METABOLIC PANEL      Component Value Range   Sodium 137  135 - 145 mEq/L   Potassium 3.3 (*) 3.5 - 5.1 mEq/L   Chloride 100  96 - 112 mEq/L   CO2 22  19 - 32 mEq/L    Glucose, Bld 183 (*) 70 - 99 mg/dL   BUN 14  6 - 23 mg/dL   Creatinine, Ser 7.82  0.50 - 1.10 mg/dL   Calcium 8.8  8.4 - 95.6 mg/dL   Total Protein 6.6  6.0 - 8.3 g/dL   Albumin 3.1 (*) 3.5 - 5.2 g/dL   AST 20  0 - 37 U/L   ALT 10  0 - 35 U/L   Alkaline Phosphatase 68  39 - 117 U/L   Total Bilirubin 0.8  0.3 - 1.2 mg/dL   GFR calc non Af Amer 57 (*) >90 mL/min   GFR calc Af Amer 66 (*) >90 mL/min  URINALYSIS, ROUTINE W REFLEX MICROSCOPIC      Component Value Range   Color, Urine YELLOW  YELLOW   APPearance CLOUDY (*) CLEAR   Specific Gravity, Urine 1.014  1.005 - 1.030   pH 6.0  5.0 - 8.0   Glucose, UA NEGATIVE  NEGATIVE mg/dL   Hgb urine dipstick MODERATE (*) NEGATIVE   Bilirubin Urine NEGATIVE  NEGATIVE   Ketones, ur NEGATIVE  NEGATIVE mg/dL   Protein, ur 30 (*) NEGATIVE mg/dL   Urobilinogen, UA 0.2  0.0 - 1.0 mg/dL   Nitrite POSITIVE (*) NEGATIVE   Leukocytes, UA LARGE (*) NEGATIVE  PROTIME-INR      Component Value Range   Prothrombin Time 18.4 (*) 11.6 - 15.2 seconds  INR 1.50 (*) 0.00 - 1.49  APTT      Component Value Range   aPTT 45 (*) 24 - 37 seconds  D-DIMER, QUANTITATIVE      Component Value Range   D-Dimer, Quant <0.22  0.00 - 0.48 ug/mL-FEU  PRO B NATRIURETIC PEPTIDE      Component Value Range   Pro B Natriuretic peptide (BNP) 4175.0 (*) 0 - 450 pg/mL  DIGOXIN LEVEL      Component Value Range   Digoxin Level 0.6 (*) 0.8 - 2.0 ng/mL  LACTIC ACID, PLASMA      Component Value Range   Lactic Acid, Venous 1.3  0.5 - 2.2 mmol/L  RETICULOCYTES      Component Value Range   Retic Ct Pct 2.1  0.4 - 3.1 %   RBC. 3.67 (*) 3.87 - 5.11 MIL/uL   Retic Count, Manual 77.1  19.0 - 186.0 K/uL  POCT I-STAT TROPONIN I      Component Value Range   Troponin i, poc 0.01  0.00 - 0.08 ng/mL   Comment 3           URINE MICROSCOPIC-ADD ON      Component Value Range   Squamous Epithelial / LPF RARE  RARE   WBC, UA TOO NUMEROUS TO COUNT  <3 WBC/hpf   RBC / HPF 7-10  <3  RBC/hpf   Bacteria, UA MANY (*) RARE   Dg Chest 2 View  04/11/2012  *RADIOLOGY REPORT*  Clinical Data: Fever, shortness of breath, weakness  CHEST - 2 VIEW  Comparison: 01/29/2012  Findings: Decreased lung volumes. Minimal enlargement of cardiac silhouette. Mitral annular calcification noted. Mildly tortuous thoracic aorta with atherosclerotic calcification. Chronic accentuation of interstitial markings little changed. No acute infiltrate, pleural effusion or pneumothorax. Bones appear diffusely demineralized.  IMPRESSION: Chronic interstitial lung disease changes. No definite acute abnormalities. Minimal enlargement of cardiac silhouette.  Original Report Authenticated By: Lollie Marrow, M.D.   Ct Head Wo Contrast  04/11/2012  *RADIOLOGY REPORT*  Clinical Data: Fever.  Slurred speech.  CT HEAD WITHOUT CONTRAST  Technique:  Contiguous axial images were obtained from the base of the skull through the vertex without contrast.  Comparison: Head CT scan 11/29/2011.  Findings: Chronic microvascular ischemic change and atrophy are again seen.  No evidence of acute abnormality including infarction, hemorrhage, mass lesion, mass effect, midline shift or abnormal extra-axial fluid collection.  No hydrocephalus or pneumocephalus. Calvarium intact.  IMPRESSION: No acute finding.  Original Report Authenticated By: Bernadene Bell. D'ALESSIO, M.D.   Dg Abd Acute W/chest  03/13/2012  *RADIOLOGY REPORT*  Clinical Data: Vomiting  ACUTE ABDOMEN SERIES (ABDOMEN 2 VIEW & CHEST 1 VIEW)  Comparison: 02/05/2012 and 01/29/2012  Findings: Cardiomediastinal silhouette is stable.  No acute infiltrate or pleural effusion.  No pulmonary edema. Mitral annulus calcifications again noted.  There is nonspecific nonobstructive bowel gas pattern.  Post cholecystectomy surgical clips are noted. No free abdominal air.  IMPRESSION: Nonspecific nonobstructive bowel gas pattern.  No free abdominal air.  No acute disease within chest.  Original Report  Authenticated By: Natasha Mead, M.D.    Lab workup shows a UTI.  INR is subtherapeutic.  Lactic acid and procalcitonin negative.  Pro BNP is elevated at 4000+.   Her temp is down and VS stable.  Will treat UTI with Rocephin, request [facility] to admit her.   2:09 PM Case discussed with Algis Downs, P.A.-C. For [facility].  Admit to med surg bed to [facility], Dr. Thedore Mins.  1. Urinary tract infection   2. Altered mental status   3. Anemia   4. Anxiety   5. Atrial fibrillation          Carleene Cooper III, MD 04/12/12 1027

## 2012-04-11 NOTE — Progress Notes (Signed)
Initial review for inpatient status is complete. 

## 2012-04-12 DIAGNOSIS — G9341 Metabolic encephalopathy: Secondary | ICD-10-CM

## 2012-04-12 DIAGNOSIS — K589 Irritable bowel syndrome without diarrhea: Secondary | ICD-10-CM | POA: Diagnosis present

## 2012-04-12 DIAGNOSIS — K529 Noninfective gastroenteritis and colitis, unspecified: Secondary | ICD-10-CM | POA: Diagnosis present

## 2012-04-12 DIAGNOSIS — E860 Dehydration: Secondary | ICD-10-CM | POA: Diagnosis present

## 2012-04-12 DIAGNOSIS — R6510 Systemic inflammatory response syndrome (SIRS) of non-infectious origin without acute organ dysfunction: Secondary | ICD-10-CM

## 2012-04-12 DIAGNOSIS — R197 Diarrhea, unspecified: Secondary | ICD-10-CM

## 2012-04-12 DIAGNOSIS — A498 Other bacterial infections of unspecified site: Secondary | ICD-10-CM

## 2012-04-12 HISTORY — DX: Metabolic encephalopathy: G93.41

## 2012-04-12 LAB — GLUCOSE, CAPILLARY
Glucose-Capillary: 183 mg/dL — ABNORMAL HIGH (ref 70–99)
Glucose-Capillary: 108 mg/dL — ABNORMAL HIGH (ref 70–99)
Glucose-Capillary: 97 mg/dL (ref 70–99)

## 2012-04-12 LAB — URINE CULTURE: Colony Count: >=100000

## 2012-04-12 LAB — CLOSTRIDIUM DIFFICILE BY PCR: Toxigenic C. Difficile by PCR: NEGATIVE

## 2012-04-12 LAB — BASIC METABOLIC PANEL
Calcium: 8.2 mg/dL — ABNORMAL LOW (ref 8.4–10.5)
GFR calc non Af Amer: 63 mL/min — ABNORMAL LOW (ref >90)
Potassium: 3.4 mEq/L — ABNORMAL LOW (ref 3.5–5.1)
Sodium: 139 mEq/L (ref 135–145)

## 2012-04-12 LAB — CBC
Hemoglobin: 9.4 g/dL — ABNORMAL LOW (ref 12.0–15.0)
Hemoglobin: 9.4 g/dL — ABNORMAL LOW (ref 12.0–15.0)
MCH: 26.4 pg (ref 26.0–34.0)
Platelets: 170 10*3/uL (ref 150–400)
Platelets: 181 10*3/uL (ref 150–400)
RBC: 3.5 MIL/uL — ABNORMAL LOW (ref 3.87–5.11)
RBC: 3.56 MIL/uL — ABNORMAL LOW (ref 3.87–5.11)
WBC: 12.4 10*3/uL — ABNORMAL HIGH (ref 4.0–10.5)

## 2012-04-12 LAB — HEMOGLOBIN A1C
Hgb A1c MFr Bld: 7.3 % — ABNORMAL HIGH (ref <5.7)
Mean Plasma Glucose: 163 mg/dL — ABNORMAL HIGH (ref <117)

## 2012-04-12 LAB — PROTIME-INR
INR: 1.63 — ABNORMAL HIGH (ref 0.00–1.49)
Prothrombin Time: 19.6 seconds — ABNORMAL HIGH (ref 11.6–15.2)

## 2012-04-12 MED ORDER — ENSURE COMPLETE PO LIQD
237.0000 mL | Freq: Two times a day (BID) | ORAL | Status: DC
  Administered 2012-04-13 – 2012-04-18 (×11): 237 mL via ORAL

## 2012-04-12 MED ORDER — VANCOMYCIN HCL IN DEXTROSE 1-5 GM/200ML-% IV SOLN
1000.0000 mg | INTRAVENOUS | Status: DC
  Administered 2012-04-12 – 2012-04-13 (×2): 1000 mg via INTRAVENOUS
  Filled 2012-04-12 (×2): qty 200

## 2012-04-12 MED ORDER — ACETAMINOPHEN 325 MG PO TABS
650.0000 mg | ORAL_TABLET | Freq: Four times a day (QID) | ORAL | Status: DC | PRN
  Administered 2012-04-12 – 2012-04-18 (×4): 650 mg via ORAL
  Filled 2012-04-12 (×3): qty 2

## 2012-04-12 MED ORDER — ATORVASTATIN CALCIUM 20 MG PO TABS
20.0000 mg | ORAL_TABLET | Freq: Every day | ORAL | Status: DC
  Administered 2012-04-12 – 2012-04-17 (×6): 20 mg via ORAL
  Filled 2012-04-12 (×7): qty 1

## 2012-04-12 MED ORDER — LOPERAMIDE HCL 2 MG PO CAPS
2.0000 mg | ORAL_CAPSULE | ORAL | Status: DC | PRN
  Administered 2012-04-12: 2 mg via ORAL
  Filled 2012-04-12: qty 1

## 2012-04-12 MED ORDER — WARFARIN SODIUM 4 MG PO TABS
4.0000 mg | ORAL_TABLET | Freq: Once | ORAL | Status: AC
  Administered 2012-04-12: 4 mg via ORAL
  Filled 2012-04-12: qty 1

## 2012-04-12 NOTE — Progress Notes (Signed)
[facility]  Interim history: [name] [name] is a 78 y.o. female, with history of hypertension, atrial fibrillation, COPD, chronic back pain, recent pancreatitis, who recently moved to her house from a skilled nursing facility is brought in by her husband as for the last 3-4 days she has showed gradually progressive weakness and lethargy, last night she started to run fevers, she denied any chest pain cough or abdominal pain, she was brought in the ER where she was found to be in early sepsis secondary to UTI, atrial fibrillation with RVR, dry oral mucosa and evidence of dehydration, I was called to admit the patient.  Subjective: Patient is somewhat lethargic and drowsy but arousable. She complains that the rectal Foley is very uncomfortable. She has a chronic "spastic colon" and often has diarrhea- at times, very watery diarrhea. No complaints of recent dysuria, hematuria or pyuria.  She still has quite a bit of anorexia and prefers clear liquid diet. She denies abdominal pain, chest pain or shortness of breath.  Objective: Vital signs in last 24 hours: Temp:  [98.2 F (36.8 C)-102.9 F (39.4 C)] 98.9 F (37.2 C) (07/09 1200) Pulse Rate:  [75-140] 75  (07/09 1200) Resp:  [14-27] 17  (07/09 1200) BP: (96-142)/(38-84) 96/41 mmHg (07/09 1200) SpO2:  [93 %-100 %] 97 % (07/09 1200) Weight:  [56 kg (123 lb 7.3 oz)-56.7 kg (125 lb)] 56 kg (123 lb 7.3 oz) (07/09 0400) Weight change:  Last BM Date: 04/12/12  Intake/Output from previous day: 07/08 0701 - 07/09 0700 In: 1315 [I.V.:1215; IV Piggyback:100] Out: -  Intake/Output this shift:    General appearance: cooperative, appears older than stated age, mild distress and slowed mentation, temperature maximum at 7 AM this morning 102.9 oral Resp: clear to auscultation bilaterally but requiring 2 L nasal cannula oxygen to maintain saturations between 94 and 100% Cardio: regular rate and atrial fibrillation rhythm ventricular  response between 75 and 119, on IV Cardizem infusion at 10 mg per hour, S1, S2 normal, no murmur, click, rub or gallop, IV fluids infusing at 135 cc per hour; edema or cyanosis GI: soft, non-tender; bowel sounds normal; no masses,  no organomegaly, rectal Foley in place with brown sediment is liquid but not watery stool Musculoskeletal: extremities normal, atraumatic without any evidence of erythema or joint effusion Neurologic: Grossly normal although somewhat lethargic but is easily arousable and neurological exam otherwise is nonfocal  Lab Results:  [facility] 04/12/12 0938 04/12/12 0817  WBC 14.2* 12.4*  HGB 9.4* 9.4*  HCT 29.6* 29.1*  PLT 181 170   BMET  Basename 04/12/12 0817 04/11/12 1116  NA 139 137  K 3.4* 3.3*  CL 107 100  CO2 19 22  GLUCOSE 100* 183*  BUN 14 14  CREATININE 0.85 0.92  CALCIUM 8.2* 8.8    Studies/Results: Dg Chest 2 View  04/11/2012  *RADIOLOGY REPORT*  Clinical Data: Fever, shortness of breath, weakness  CHEST - 2 VIEW  Comparison: 01/29/2012  Findings: Decreased lung volumes. Minimal enlargement of cardiac silhouette. Mitral annular calcification noted. Mildly tortuous thoracic aorta with atherosclerotic calcification. Chronic accentuation of interstitial markings little changed. No acute infiltrate, pleural effusion or pneumothorax. Bones appear diffusely demineralized.  IMPRESSION: Chronic interstitial lung disease changes. No definite acute abnormalities. Minimal enlargement of cardiac silhouette.  Original Report Authenticated By: Lollie Marrow, M.D.   Ct Head Wo Contrast  04/11/2012  *RADIOLOGY REPORT*  Clinical Data: Fever.  Slurred speech.  CT HEAD WITHOUT CONTRAST  Technique:  Contiguous axial images  were obtained from the base of the skull through the vertex without contrast.  Comparison: Head CT scan 11/29/2011.  Findings: Chronic microvascular ischemic change and atrophy are again seen.  No evidence of acute abnormality including infarction,  hemorrhage, mass lesion, mass effect, midline shift or abnormal extra-axial fluid collection.  No hydrocephalus or pneumocephalus. Calvarium intact.  IMPRESSION: No acute finding.  Original Report Authenticated By: Bernadene Bell. Maricela Curet, M.D.    Medications: I have reviewed the patient's current medications.  Assessment/Plan:  Principal Problem:  *Sepsis *Likely source secondary to underlying urinary tract infection *Treat underlying causes and provide supportive care *Currently is hemodynamically stable although did spike another fever this morning *Despite identification of Escherichia coli and urinary tract,  blood cultures are pending * given the fact the patient has recently been in rehabilitation at a skilled nursing facility, we will add vancomycin for MRSA coverage  Active Problems:  E-coli UTI *Greater than 100,000 colonies sensitivities pending *We'll continue meropenem   Atrial fibrillation with RVR *Currently better rate control *Continue IV Cardizem for now *Once euvolemic anticipate can wean and discontinue the IV Cardizem in favor of home dose oral Cardizem *Continue digoxin and low-dose metoprolol  Warfarin anticoagulation *INR subtherapeutic at presentation *Pharmacy managing dosing   Dehydration *Probably has a degree of chronic dehydration given the chronic loose stools *Made worse by poor intake prior to admission *Continue IV fluid hydration   DM II (diabetes mellitus, type II), controlled *CBGs currently control *Continue sliding scale insulin *Was on Amaryl prior to admission   HTN (hypertension) *Blood pressure actually somewhat low and suboptimal so we'll be cautious with utilizing any antihypertensive agent   Metabolic encephalopathy (mild) *Secondary to acute infection and high fevers-treat underlying causes *Neuro exam nonfocal   Chronic epigastric pain/ Irritable bowel disease/Chronic diarrhea   Disposition *Remain in step down   LOS: 1  day   Junious Silk, ANP pager [phone]  [facility] [email or web address] Password: [email or web address]  04/12/2012, 12:44 PM   I have examined the above patient, reviewed the chart and have modified the above note which I agree with.   Calvert Cantor, MD [phone]

## 2012-04-12 NOTE — Evaluation (Signed)
Clinical/Bedside Swallow Evaluation Patient Details  Name: [name] [name] MRN: [id number] Date of Birth: 1930/01/11  Today's Date: 04/12/2012 Time: 0810-0827 SLP Time Calculation (min): 17 min  Past Medical History:  Past Medical History  Diagnosis Date  . Pancreatitis     ~2008 and again in 11/2011 s/p ERCP  . Hypertension   . Glaucoma   . High cholesterol   . GERD (gastroesophageal reflux disease)   . H/O hiatal hernia   . Atrial fibrillation   . Dizziness - light-headed   . Pneumonia 12/2011    "first time I know about"  . Shortness of breath on exertion   . Type II diabetes mellitus   . Depression   . Chronic lower back pain    Past Surgical History:  Past Surgical History  Procedure Date  . Ercp 11/13/2011    Procedure: ENDOSCOPIC RETROGRADE CHOLANGIOPANCREATOGRAPHY (ERCP);  Surgeon: Theda Belfast, MD;  Location: [facility] ENDOSCOPY;  Service: Endoscopy;  Laterality: N/A;  . Tonsillectomy and adenoidectomy ~ 1939  . Cholecystectomy 1990's  . Appendectomy ~ 1960  . Vaginal hysterectomy ~ 1960's  . Dilation and curettage of uterus   . Cataract extraction w/ intraocular lens  implant, bilateral 2000's   HPI:  [name] [name]  is a 78 y.o. female, with history of hypertension, atrial fibrillation, COPD, chronic back pain, recent pancreatitis, who recently moved to her house from a skilled nursing facility is brought in by her husband as for the last 3-4 days she has showed gradually progressive weakness and lethargy, last night she started to run fevers, she denied any chest pain cough or abdominal pain, she was brought in the ER where she was found to be in early sepsis secondary to UTI, atrial fibrillation with RVR, dry oral mucosa and evidence of dehydration. CXR is clear.   Assessment / Plan / Recommendation Clinical Impression  Pt presents with no overt signs of aspiration. Pt with functional, timely swallow response with large consecutive thin liquids boluses, 8oz.  However pt with mild oral dysphagia with solids due to absent saliva, dry oral mucosa. Recommend initiating a Dysphagia 3 mechanical soft diet with extra gravy/sauce and thin liquids. SLP will follow up for tolerance.     Aspiration Risk  Mild    Diet Recommendation Dysphagia 3 (Mechanical Soft);Thin liquid   Liquid Administration via: Cup;Straw Medication Administration: Whole meds with liquid Supervision: Patient able to self feed (needs assist) Compensations: Slow rate;Small sips/bites;Check for pocketing;Follow solids with liquid Postural Changes and/or Swallow Maneuvers: Seated upright 90 degrees;Upright 30-60 min after meal    Other  Recommendations Oral Care Recommendations: Oral care QID;Oral care before and after PO   Follow Up Recommendations  None    Frequency and Duration min 2x/week  2 weeks   Pertinent Vitals/Pain NA    SLP Swallow Goals Patient will consume recommended diet without observed clinical signs of aspiration with: Minimal assistance Patient will utilize recommended strategies during swallow to increase swallowing safety with: Minimal cueing   Swallow Study Prior Functional Status       General HPI: [name] [name]  is a 78 y.o. female, with history of hypertension, atrial fibrillation, COPD, chronic back pain, recent pancreatitis, who recently moved to her house from a skilled nursing facility is brought in by her husband as for the last 3-4 days she has showed gradually progressive weakness and lethargy, last night she started to run fevers, she denied any chest pain cough or abdominal pain, she was brought in the ER  where she was found to be in early sepsis secondary to UTI, atrial fibrillation with RVR, dry oral mucosa and evidence of dehydration. CXR is clear. Type of Study: Bedside swallow evaluation Diet Prior to this Study: NPO Temperature Spikes Noted: Yes Respiratory Status: Supplemental O2 delivered via (comment) Behavior/Cognition:  Alert;Cooperative Oral Cavity - Dentition: Adequate natural dentition Self-Feeding Abilities: Able to feed self Patient Positioning: Upright in bed Baseline Vocal Quality: Low vocal intensity;Clear Volitional Cough: Strong Volitional Swallow: Able to elicit    Oral/Motor/Sensory Function Overall Oral Motor/Sensory Function: Impaired (generalized weakness, full ROM)   Ice Chips Ice chips: Impaired Presentation: Spoon Oral Phase Impairments: Impaired anterior to posterior transit Oral Phase Functional Implications: Prolonged oral transit   Thin Liquid Thin Liquid: Within functional limits Presentation: Cup;Straw    Nectar Thick Nectar Thick Liquid: Not tested   Honey Thick Honey Thick Liquid: Not tested   Puree Puree: Within functional limits   Solid Solid: Impaired Presentation: Self Fed Oral Phase Impairments: Reduced labial seal;Reduced lingual movement/coordination;Impaired anterior to posterior transit Oral Phase Functional Implications: Oral residue;Other (comment) (required 2 puree boluses to moisten solid bolus for transit)    Malarie Tappen, [employer] 04/12/2012,8:35 AM

## 2012-04-12 NOTE — Progress Notes (Signed)
INITIAL ADULT NUTRITION ASSESSMENT Date: 04/12/2012   Time: 10:00 AM Reason for Assessment: Low Braden Score  INTERVENTION: 1. Ensure Complete po BID, each supplement provides 350 kcal and 13 grams of protein. 2. RD will continue to follow    ASSESSMENT: Female 78 y.o.  Dx: SIRS (systemic inflammatory response syndrome)  Hx:  Past Medical History  Diagnosis Date  . Pancreatitis     ~2008 and again in 11/2011 s/p ERCP  . Hypertension   . Glaucoma   . High cholesterol   . GERD (gastroesophageal reflux disease)   . H/O hiatal hernia   . Atrial fibrillation   . Dizziness - light-headed   . Pneumonia 12/2011    "first time I know about"  . Shortness of breath on exertion   . Type II diabetes mellitus   . Depression   . Chronic lower back pain     Related Meds:     . sodium chloride   Intravenous Once  . digoxin  0.125 mg Intravenous Once  . digoxin  0.0625 mg Oral Q M,W,F  . digoxin  0.125 mg Oral Q T,Th,S,Su  . diltiazem  10 mg Intravenous Once  . insulin aspart  0-9 Units Subcutaneous Q4H  . meropenem (MERREM) IV  1 g Intravenous To ER  . meropenem (MERREM) IV  1 g Intravenous Q12H  . metoprolol tartrate  12.5 mg Oral BID  . OLANZapine  5 mg Oral QHS  . pantoprazole  40 mg Oral Q1200  . potassium chloride  10 mEq Intravenous Q1 Hr x 3  . potassium chloride      . simvastatin  40 mg Oral QPM  . sodium chloride  3 mL Intravenous Q12H  . vancomycin  1,000 mg Intravenous Q24H  . warfarin  2.5 mg Oral ONCE-1800  . warfarin  4 mg Oral ONCE-1800  . Warfarin - Pharmacist Dosing Inpatient   Does not apply q1800  . DISCONTD: ciprofloxacin  400 mg Intravenous Once  . DISCONTD: digoxin  62.5-125 mcg Oral Daily     Ht: 5' (152.4 cm)  Wt: 123 lb 7.3 oz (56 kg)  Ideal Wt: 45.5 kg  % Ideal Wt: 123%  Usual Wt: 140 lbs per husband, >6 months ago Wt Readings from Last 10 Encounters:  04/12/12 123 lb 7.3 oz (56 kg)  03/15/12 122 lb 14.4 oz (55.747 kg)  03/11/12 116 lb  12.8 oz (52.98 kg)  01/29/12 127 lb 9.6 oz (57.879 kg)  01/15/12 115 lb 15.4 oz (52.6 kg)  12/19/11 119 lb 7.8 oz (54.2 kg)  11/30/11 127 lb 6.4 oz (57.788 kg)  11/30/11 127 lb 6.4 oz (57.788 kg)  11/30/11 127 lb 6.4 oz (57.788 kg)    % Usual Wt: 88%  Body mass index is 24.11 kg/(m^2). WNL  Food/Nutrition Related Hx: Per husband, pt with decreased appetite in the past, recently has been eating well and gaining weight   Labs:  CMP     Component Value Date/Time   NA 139 04/12/2012 0817   K 3.4* 04/12/2012 0817   CL 107 04/12/2012 0817   CO2 19 04/12/2012 0817   GLUCOSE 100* 04/12/2012 0817   BUN 14 04/12/2012 0817   CREATININE 0.85 04/12/2012 0817   CALCIUM 8.2* 04/12/2012 0817   PROT 6.6 04/11/2012 1116   ALBUMIN 3.1* 04/11/2012 1116   AST 20 04/11/2012 1116   ALT 10 04/11/2012 1116   ALKPHOS 68 04/11/2012 1116   BILITOT 0.8 04/11/2012 1116   GFRNONAA 63*  04/12/2012 0817   GFRAA 73* 04/12/2012 0817  Temp (24hrs), Avg:99.9 F (37.7 C), Min:98.2 F (36.8 C), Max:102.9 F (39.4 C)     Intake/Output Summary (Last 24 hours) at 04/12/12 1003 Last data filed at 04/12/12 0700  Gross per 24 hour  Intake   1315 ml  Output      0 ml  Net   1315 ml     Diet Order: Clear Liquid  Supplements/Tube Feeding: none  IVF:    sodium chloride Last Rate: 125 mL/hr at 04/12/12 0442  sodium chloride Last Rate: 500 mL/hr at 04/11/12 1533  diltiazem (CARDIZEM) infusion Last Rate: 10 mg/hr (04/12/12 0802)  DISCONTD: sodium chloride     Estimated Nutritional Needs:   Kcal: 1500-1700 Protein: 55-65 gm  Fluid: 1.5-1.7 L   Pt admitted with early sepsis from UTI. Pt with recent admissions for pancreatitis. Husband reports that pt had decreased appetite and 20 lb weight loss with pancreatitis but has recently been eating well and gaining weight back. Pt was drinking Ensure at home, but since return of appetite has not continued with this.  Husband wishes for pt to have Ensure available if appetite is poor.  Current po intake is only a few bites of breakfast as pt falling asleep every few minutes.   NUTRITION DIAGNOSIS: -Predicted suboptimal energy intake (NI-1.6).  Status: Ongoing  RELATED TO: increased needs with fever  AS EVIDENCE BY: only bites for breakfast this morning  MONITORING/EVALUATION(Goals): Goal: PO intake of meals and supplements will provide >90% of estimated nutrition needs  Monitor: PO intake, weight, labs, vitals  EDUCATION NEEDS: -No education needs identified at this time   DOCUMENTATION CODES Per approved criteria  -Not Applicable    Clarene Duke RD, LDN Pager [phone] After Hours pager [phone]

## 2012-04-12 NOTE — Progress Notes (Addendum)
ANTIBIOTIC CONSULT NOTE - INITIAL ANTICOAGULATION CONSULT NOTE - FOLLOW UP  Pharmacy Consult:  Vancomycin + Coumadin Indication:  UTI / early sepsis + history of atrial fibrillation  Allergies  Allergen Reactions  . Lactose Intolerance (Gi) Nausea And Vomiting    severe stomach pain  . Penicillins Hives and Itching    "haven't used any since 1970's"  . Sulfa Antibiotics Hives and Itching    "haven't used any since 1970's"    Patient Measurements: Height: 5' (152.4 cm) Weight: 123 lb 7.3 oz (56 kg) IBW/kg (Calculated) : 45.5   Vital Signs: Temp: 102.9 F (39.4 C) (07/09 0800) Temp src: Oral (07/09 0800) BP: 121/57 mmHg (07/09 0800) Pulse Rate: 104  (07/09 0800) Intake/Output from previous day: 07/08 0701 - 07/09 0700 In: 1315 [I.V.:1215; IV Piggyback:100] Out: -   Labs:  Basename 04/11/12 1116  WBC 16.1*  HGB 10.0*  PLT 195  LABCREA --  CREATININE 0.92   Estimated Creatinine Clearance: 38.3 ml/min (by C-G formula based on Cr of 0.92). No results found for this basename: VANCOTROUGH:2,VANCOPEAK:2,VANCORANDOM:2,GENTTROUGH:2,GENTPEAK:2,GENTRANDOM:2,TOBRATROUGH:2,TOBRAPEAK:2,TOBRARND:2,AMIKACINPEAK:2,AMIKACINTROU:2,AMIKACIN:2, in the last 72 hours   Microbiology: Recent Results (from the past 720 hour(s))  URINE CULTURE     Status: Normal   Collection Time   03/13/12  3:13 PM      Component Value Range Status Comment   Specimen Description URINE, CATHETERIZED   Final    Special Requests NONE   Final    Culture  Setup Time [id number]   Final    Colony Count NO GROWTH   Final    Culture NO GROWTH   Final    Report Status 03/15/2012 FINAL   Final   CLOSTRIDIUM DIFFICILE BY PCR     Status: Normal   Collection Time   03/14/12  9:46 AM      Component Value Range Status Comment   C difficile by pcr NEGATIVE  NEGATIVE Final   CULTURE, BLOOD (ROUTINE X 2)     Status: Normal (Preliminary result)   Collection Time   04/11/12 11:30 AM      Component Value Range Status  Comment   Specimen Description BLOOD RIGHT ARM   Final    Special Requests BOTTLES DRAWN AEROBIC AND ANAEROBIC 10CC   Final    Culture  Setup Time 04/11/2012 17:29   Final    Culture     Final    Value:        BLOOD CULTURE RECEIVED NO GROWTH TO DATE CULTURE WILL BE HELD FOR 5 DAYS BEFORE ISSUING A FINAL NEGATIVE REPORT   Report Status PENDING   Incomplete   CULTURE, BLOOD (ROUTINE X 2)     Status: Normal (Preliminary result)   Collection Time   04/11/12 11:40 AM      Component Value Range Status Comment   Specimen Description BLOOD RIGHT HAND   Final    Special Requests BOTTLES DRAWN AEROBIC ONLY 10CC   Final    Culture  Setup Time 04/11/2012 17:29   Final    Culture     Final    Value:        BLOOD CULTURE RECEIVED NO GROWTH TO DATE CULTURE WILL BE HELD FOR 5 DAYS BEFORE ISSUING A FINAL NEGATIVE REPORT   Report Status PENDING   Incomplete   MRSA PCR SCREENING     Status: Normal   Collection Time   04/11/12  5:40 PM      Component Value Range Status Comment   MRSA by PCR  NEGATIVE  NEGATIVE Final        Assessment: 78 YO nursing home patient with complicated medical history admitted with progressive weakness, lethargy, and fever.  MD suspected early sepsis due to UTI and Merrem was started on 04/11/12.  Patient still febrile today and vancomycin added to broaden coverage.  Patient with stable renal function with calculated CrCL ~50 ml/min.  Merrem 7/8 >> Vanc 7/9 >>  7/8 blood cx - pending 7/8 urine cx - pending  Pt also with h/o Afib on chronic Coumadin.  INR subtherapeutic but increasing, no bleeding reported.   Goal of Therapy:  Vanc trough 15-20 mcg/mL until sepsis ruled out INR 2 - 3    Plan:  - Vanc 1gm IV Q24H - Continue Merrem 1 gm IV Q12H - Coumadin 4mg  PO today - Monitor PT / INR, renal function, clinical course, vanc trough as appropriate - Consider resuming home meds as appropriate     Darianny Momon D. Laney Potash, PharmD, BCPS Pager:  [phone] 04/12/2012, 8:56 AM

## 2012-04-12 NOTE — Care Management Note (Signed)
    Page 1 of 1   04/12/2012     12:04:19 PM   CARE MANAGEMENT NOTE 04/12/2012  Patient:  [name] [name], [name] [name]   Account Number:  [id number]  Date Initiated:  04/12/2012  Documentation initiated by:  Junius Creamer  Subjective/Objective Assessment:   adm w sirs     Action/Plan:   lives w husband, phy ther rec snf, sw ref made   Anticipated DC Date:     Anticipated DC Plan:    In-house referral  Clinical Social Worker      DC Associate Professor  CM consult      Choice offered to / List presented to:             Status of service:   Medicare Important Message given?   (If response is "NO", the following Medicare IM given date fields will be blank) Date Medicare IM given:   Date Additional Medicare IM given:    Discharge Disposition:    Per UR Regulation:  Reviewed for med. necessity/level of care/duration of stay  If discussed at Long Length of Stay Meetings, dates discussed:    Comments:  7/9 12n debbie Dartanion Teo rn,bsn [phone]

## 2012-04-12 NOTE — Evaluation (Signed)
Physical Therapy Evaluation Patient Details Name: [name] [name] MRN: [id number] DOB: 16-Jun-1930 Today's Date: 04/12/2012 Time: 1478-2956 PT Time Calculation (min): 10 min  PT Assessment / Plan / Recommendation Clinical Impression  Pt adm with SIRS and a-fib with RVR.  Pt just recently returned home after SNF stay after last hospitalization.    PT Assessment  Patient needs continued PT services    Follow Up Recommendations  Skilled nursing facility    Barriers to Discharge        Equipment Recommendations  None recommended by PT    Recommendations for Other Services     Frequency Min 3X/week    Precautions / Restrictions Precautions Precautions: Fall   Pertinent Vitals/Pain N/A      Mobility  Bed Mobility Bed Mobility: Supine to Sit;Sit to Supine;Scooting to HOB Supine to Sit: 1: +1 Total assist;HOB elevated Sit to Supine: 1: +1 Total assist;HOB elevated Scooting to HOB: 1: +2 Total assist Scooting to [facility]: Patient Percentage: 0% Details for Bed Mobility Assistance: Pt lethargic.  Used bed pad to facilitate mobility.    Exercises     PT Diagnosis: Difficulty walking;Generalized weakness  PT Problem List: Decreased strength;Decreased activity tolerance;Decreased balance;Decreased mobility PT Treatment Interventions: DME instruction;Gait training;Functional mobility training;Patient/family education;Therapeutic activities;Therapeutic exercise;Balance training   PT Goals Acute Rehab PT Goals PT Goal Formulation: With family Time For Goal Achievement: 04/26/12 Potential to Achieve Goals: Good Pt will go Supine/Side to Sit: with min assist PT Goal: Supine/Side to Sit - Progress: Goal set today Pt will go Sit to Supine/Side: with min assist PT Goal: Sit to Supine/Side - Progress: Goal set today Pt will go Sit to Stand: with min assist PT Goal: Sit to Stand - Progress: Goal set today Pt will go Stand to Sit: with min assist PT Goal: Stand to Sit - Progress: Goal  set today Pt will Ambulate: 51 - 150 feet;with min assist;with least restrictive assistive device PT Goal: Ambulate - Progress: Goal set today  Visit Information  Last PT Received On: 04/12/12 Assistance Needed: +2    Subjective Data  Subjective: "I can't do this," pt stated when we got her sitting EOB. Patient Stated Goal: Not stated due to lethargy.   Prior Functioning  Home Living Lives With: Spouse Available Help at Discharge: Family;Available 24 hours/day Type of Home: House Home Access: Ramped entrance Home Layout: One level Bathroom Shower/Tub: Engineer, manufacturing systems: Handicapped height Bathroom Accessibility: Yes How Accessible: Accessible via walker Home Adaptive Equipment: Bedside commode/3-in-1;Walker - rolling;Walker - four wheeled;Shower chair with back Prior Function Level of Independence: Needs assistance Driving: No Vocation: Retired Comments: Husband reports pt uses walker at home while therapy present but furniture walks when therapy isn't there. Communication Communication: No difficulties    Cognition  Overall Cognitive Status: Difficult to assess Difficult to assess due to: Level of arousal Arousal/Alertness: Lethargic Behavior During Session: Lethargic    Extremity/Trunk Assessment Right Lower Extremity Assessment RLE ROM/Strength/Tone: Deficits RLE ROM/Strength/Tone Deficits: grossly 3/5 Left Lower Extremity Assessment LLE ROM/Strength/Tone: Deficits LLE ROM/Strength/Tone Deficits: grossly 3/5   Balance Static Sitting Balance Static Sitting - Balance Support: Bilateral upper extremity supported;Feet unsupported Static Sitting - Level of Assistance: 2: Max assist Static Sitting - Comment/# of Minutes: Pt only able to tolerate sitting for 15-20 seconds.  End of Session PT - End of Session Activity Tolerance: Patient limited by fatigue Patient left: in bed;with call bell/phone within reach;with family/visitor present Nurse Communication:  Mobility status  GP     [facility]  04/12/2012, 10:38 AM  Skip Mayer PT [phone]

## 2012-04-13 LAB — GLUCOSE, CAPILLARY
Glucose-Capillary: 97 mg/dL (ref 70–99)
Glucose-Capillary: 232 mg/dL — ABNORMAL HIGH (ref 70–99)
Glucose-Capillary: 256 mg/dL — ABNORMAL HIGH (ref 70–99)

## 2012-04-13 LAB — BASIC METABOLIC PANEL
Chloride: 105 mEq/L (ref 96–112)
Creatinine, Ser: 0.85 mg/dL (ref 0.50–1.10)
GFR calc Af Amer: 73 mL/min — ABNORMAL LOW (ref >90)

## 2012-04-13 LAB — PROTIME-INR
INR: 1.82 — ABNORMAL HIGH (ref 0.00–1.49)
Prothrombin Time: 21.4 seconds — ABNORMAL HIGH (ref 11.6–15.2)

## 2012-04-13 MED ORDER — ALPRAZOLAM 0.5 MG PO TABS
0.5000 mg | ORAL_TABLET | Freq: Four times a day (QID) | ORAL | Status: DC | PRN
  Administered 2012-04-13 – 2012-04-14 (×3): 0.5 mg via ORAL
  Filled 2012-04-13 (×3): qty 1

## 2012-04-13 MED ORDER — DEXTROSE 5 % IV SOLN
1.0000 g | INTRAVENOUS | Status: DC
  Administered 2012-04-13 – 2012-04-15 (×3): 1 g via INTRAVENOUS
  Filled 2012-04-13 (×7): qty 10

## 2012-04-13 MED ORDER — WARFARIN SODIUM 4 MG PO TABS
4.0000 mg | ORAL_TABLET | Freq: Once | ORAL | Status: AC
  Administered 2012-04-13: 4 mg via ORAL
  Filled 2012-04-13: qty 1

## 2012-04-13 MED ORDER — POTASSIUM CHLORIDE CRYS ER 20 MEQ PO TBCR
40.0000 meq | EXTENDED_RELEASE_TABLET | Freq: Once | ORAL | Status: AC
  Administered 2012-04-13: 40 meq via ORAL
  Filled 2012-04-13: qty 2

## 2012-04-13 NOTE — Progress Notes (Signed)
[facility] [place] TEAM 1 - Stepdown/ICU TEAM  Interim history: [name] [name] is a 78 y.o. female, with history of hypertension, atrial fibrillation, COPD, chronic back pain, recent pancreatitis, who recently moved to her house from a skilled nursing facility who was brought in by her husband as for the last 3-4 days she has showed gradually progressive weakness and lethargy, and she started to run fevers, she denied any chest pain cough or abdominal pain, she was brought in the ER where she was found to be in early sepsis secondary to UTI, atrial fibrillation with RVR, dry oral mucosa and evidence of dehydration.  Subjective: Much more alert and is tolerating a clear liquid diet. She still seems slightly confused. She denies shortness of breath or chest pain.  Objective: Blood pressure 134/48, pulse 86, temperature 100.3 F (37.9 C), temperature source Oral, resp. rate 26, height 5' (1.524 m), weight 58.3 kg (128 lb 8.5 oz), SpO2 92.00%.  Intake/Output from previous day: 07/09 0701 - 07/10 0700 In: 1992 [P.O.:240; I.V.:1350; IV Piggyback:402] Out: 1000 [Stool:1000] Intake/Output this shift: Total I/O In: 980 [P.O.:980] Out: -   General appearance: cooperative, appears older than stated age, no distress Resp: clear to auscultation bilaterally but requiring 2 L nasal cannula oxygen to maintain saturations between 94 and 100% Cardio: regular rate and atrial fibrillation rhythm ventricular response between 87 and 117, on IV Cardizem infusion, S1, S2 normal, no murmur, click, rub or gallop GI: soft, non-tender; bowel sounds normal; no masses,  no organomegaly, rectal foley in place with brown sediment - liquid but not watery stool Musculoskeletal: extremities normal, atraumatic without any evidence of erythema or joint effusion Neurologic: Grossly normal although may have some mild confusion  Lab Results:  Basename 04/12/12 0938 04/12/12 0817  WBC 14.2* 12.4*  HGB 9.4*  9.4*  HCT 29.6* 29.1*  PLT 181 170   BMET  Basename 04/13/12 0542 04/12/12 0817  NA 135 139  K 3.1* 3.4*  CL 105 107  CO2 18* 19  GLUCOSE 122* 100*  BUN 16 14  CREATININE 0.85 0.85  CALCIUM 8.3* 8.2*    Studies/Results: Reveiwed  Medications: I have reviewed the patient's current medications.  Assessment/Plan:  Sepsis *Likely source secondary to underlying urinary tract infection *Treat underlying causes and provide supportive care *Currently is hemodynamically stable although did spike another fever this morning *identification of Escherichia coli in urinary tract - blood cultures are pending  E-coli UTI *Greater than 100,000 colonies sensitivities pending - on Rocephin  Atrial fibrillation with RVR *Currently better rate control *Continue IV Cardizem for now *Once euvolemic anticipate can wean and discontinue the IV Cardizem in favor of home dose oral Cardizem *Continue digoxin and low-dose metoprolol  Warfarin anticoagulation *INR subtherapeutic at presentation *Pharmacy managing dosing  Dehydration *Probably has a degree of chronic dehydration given the chronic loose stools *Made worse by poor intake prior to admission *Continue IV fluid hydration   DM II (diabetes mellitus, type II), controlled *CBGs are beginning to increase somewhat after initiation of clear liquid diet *Continue sliding scale insulin but if blood sugars continue to increase may need to increase to moderate scale *Was on Amaryl prior to admission - hold until better hydrated  HTN (hypertension) *Blood pressure actually somewhat low and suboptimal so we'll be cautious with utilizing any antihypertensive agent  Metabolic encephalopathy (mild) *Secondary to acute infection and high fevers-treat underlying causes *Neuro exam nonfocal  Chronic epigastric pain/ Irritable bowel disease/Chronic diarrhea   Disposition *Remain in step  down until BP and HR/rythm more stable    LOS: 2 days     Junious Silk, ANP pager [phone]  [facility] [email or web address] Password: [email or web address]  04/13/2012, 12:37 PM  I have personally examined this patient and reviewed the entire database. I have reviewed the above note, made any necessary editorial changes, and agree with its content.  Lonia Blood, MD [facility]

## 2012-04-13 NOTE — Progress Notes (Signed)
Speech Language Pathology Dysphagia Treatment Patient Details Name: [name] [name] MRN: [id number] DOB: 1930-09-02 Today's Date: 04/13/2012 Time: 4098-1191 SLP Time Calculation (min): 14 min  Assessment / Plan / Recommendation Clinical Impression  Pt tolearting thin liquids without any s/s of aspriation. Husband at bedside reprots she typically has a good appetite. Pt requested steak and a small baked potato and a trip to the [employer] on [address]. Pt to continue clear liquid diet for now, SLP will sign off. When pt initiates solid foods, consider a moist diet due to improving but still significant xerostomia.     Diet Recommendation  Continue with Current Diet: Thin liquid    SLP Plan All goals met;Discharge SLP treatment due to (comment)   Pertinent Vitals/Pain NA   Swallowing Goals  SLP Swallowing Goals Patient will consume recommended diet without observed clinical signs of aspiration with: Minimal assistance Swallow Study Goal #1 - Progress: Met Patient will utilize recommended strategies during swallow to increase swallowing safety with: Minimal cueing Swallow Study Goal #2 - Progress: Met  General Temperature Spikes Noted: No Respiratory Status: Supplemental O2 delivered via (comment) Behavior/Cognition: Alert;Cooperative Oral Cavity - Dentition: Adequate natural dentition Patient Positioning: Upright in bed  Oral Cavity - Oral Hygiene Does patient have any of the following "at risk" factors?: Mucous Membranes - reddened Patient is AT RISK - Oral Care Protocol followed (see row info): Yes   Dysphagia Treatment Treatment focused on: Skilled observation of diet tolerance Treatment Methods/Modalities: Skilled observation Patient observed directly with PO's: Yes Type of PO's observed: Thin liquids Feeding: Able to feed self Liquids provided via: Straw   GO     Benjamim Harnish, Riley Nearing 04/13/2012, 9:59 AM

## 2012-04-13 NOTE — Clinical Social Work Placement (Addendum)
     Clinical Social Work Department CLINICAL SOCIAL WORK PLACEMENT NOTE 04/18/2012  Patient:  LINZIE, CRISS  Account Number:  [id number] Admit date:  04/11/2012  Clinical Social Worker:  Rozetta Nunnery, Theresia Majors  Date/time:  04/13/2012 12:03 PM  Clinical Social Work is seeking post-discharge placement for this patient at the following level of care:   SKILLED NURSING   (*CSW will update this form in Epic as items are completed)   04/13/2012  Patient/family provided with [facility] Department of Clinical Social Works list of facilities offering this level of care within the geographic area requested by the patient (or if unable, by the patients family).  04/13/2012  Patient/family informed of their freedom to choose among providers that offer the needed level of care, that participate in Medicare, Medicaid or managed care program needed by the patient, have an available bed and are willing to accept the patient.  04/13/2012  Patient/family informed of [facility] ownership interest in [facility], as well as of the fact that they are under no obligation to receive care at this facility.  PASARR submitted to EDS on 03/02/2012 PASARR number received from EDS on 03/02/2012  FL2 transmitted to all facilities in geographic area requested by pt/family on  04/13/2012 FL2 transmitted to all facilities within larger geographic area on   Patient informed that his/her managed care company has contracts with or will negotiate with  certain facilities, including the following:     Patient/family informed of bed offers received:  04/15/2012 Patient chooses bed at [facility], [place] Physician recommends and patient chooses bed at    [facility] [place] on  04/18/2012 Patient to be transferred to facility by ptar  The following physician request were entered in Epic:   Additional Comments:

## 2012-04-13 NOTE — Clinical Social Work Psychosocial (Signed)
     Clinical Social Work Department BRIEF PSYCHOSOCIAL ASSESSMENT 04/13/2012  Patient:  [name] [name], [name] [name]     Account Number:  [id number]     Admit date:  04/11/2012  Clinical Social Worker:  Hulan Fray  Date/Time:  04/13/2012 10:50 AM  Referred by:  Care Management  Date Referred:  04/11/2012 Referred for  SNF Placement   Other Referral:   Interview type:  Patient Other interview type:   husband, [name] [name]    PSYCHOSOCIAL DATA Living Status:  HUSBAND Admitted from facility:   Level of care:   Primary support name:  [name] [name] Primary support relationship to patient:  SPOUSE Degree of support available:   supportive    CURRENT CONCERNS Current Concerns  Post-Acute Placement   Other Concerns:    SOCIAL WORK ASSESSMENT / PLAN Clinical Social Worker received referral for short term SNF placement. CSW reviewed chart. CSW met with patient and husband, [name] [name] at bedside. CSW explained purpose of consult and answered patient and families questions. Patient's husband explained that the patient has been previously to [facility] and would like to return back if a bed is available. Patient's second choice would be [facility]. CSW will initiate bed search for preferred facilities and complete FL2 for MD's signature.   Assessment/plan status:  Psychosocial Support/Ongoing Assessment of Needs Other assessment/ plan:   Information/referral to community resources:   SNF packet    PATIENTS/FAMILYS RESPONSE TO PLAN OF CARE: Patient and husband, [name] [name] were appreciative of CSW's visit. Both are agreeable for short term SNF placement.

## 2012-04-13 NOTE — Progress Notes (Signed)
ANTICOAGULATION CONSULT NOTE - Follow Up Consult  Pharmacy Consult:  Coumadin Indication: atrial fibrillation  Allergies  Allergen Reactions  . Lactose Intolerance (Gi) Nausea And Vomiting    severe stomach pain  . Penicillins Hives and Itching    "haven't used any since 1970's"  . Sulfa Antibiotics Hives and Itching    "haven't used any since 1970's"    Patient Measurements: Height: 5' (152.4 cm) Weight: 128 lb 8.5 oz (58.3 kg) IBW/kg (Calculated) : 45.5   Vital Signs: Temp: 98.5 F (36.9 C) (07/10 0753) Temp src: Oral (07/10 0753) BP: 104/51 mmHg (07/10 0753) Pulse Rate: 88  (07/10 0753)  Labs:  Basename 04/13/12 0542 04/12/12 1478 04/12/12 0817 04/11/12 1116  HGB -- 9.4* 9.4* --  HCT -- 29.6* 29.1* 30.3*  PLT -- 181 170 195  APTT -- -- -- 45*  LABPROT 21.4* -- 19.6* 18.4*  INR 1.82* -- 1.63* 1.50*  HEPARINUNFRC -- -- -- --  CREATININE 0.85 -- 0.85 0.92  CKTOTAL -- -- -- --  CKMB -- -- -- --  TROPONINI -- -- -- --    Estimated Creatinine Clearance: 42.2 ml/min (by C-G formula based on Cr of 0.85).       Assessment: 78 YO nursing home patient with complicated medical history admitted with progressive weakness, lethargy, and fever. MD suspected early sepsis due to UTI and Merrem started on 04/11/12; vancomycin added on 04/12/12 for continued fever.  Patient with stable renal function.  Pharmacy also managing Coumadin for h/o Afib.  INR trending up towards goal.  No bleeding documented.   Merrem 7/8 >> Vanc 7/9 >>  7/8 urine cx - E.coli, pan-sensitive 7/8 blood cx - NGTD    Goal of Therapy:  INR 2-3 Monitor platelets by anticoagulation protocol: Yes Vanc trough 15-20 mcg/mL until sepsis ruled out     Plan:  - Repeat Coumadin 4mg  PO today - Vanc 1gm IV Q24H - Merrem 1 gm IV Q12H - Monitor PT / INR, renal function, vanc trough as indicated - Consider resuming home meds as appropriate: Ritalin, Xalatan, Zoloft, glimepiride, Lasix, multivitamin -  Follow C/S to de-escalate abx     Raider Valbuena D. Laney Potash, PharmD, BCPS Pager:  [phone] 04/13/2012, 11:07 AM

## 2012-04-13 NOTE — Progress Notes (Signed)
Physical Therapy Treatment Patient Details Name: [name] [name] MRN: [id number] DOB: September 23, 1930 Today's Date: 04/13/2012 Time: 9147-8295 PT Time Calculation (min): 21 min  PT Assessment / Plan / Recommendation Comments on Treatment Session  Pt continues to be limited by fatigue and generalized weakness acute PT will contiue to follow pt.      Follow Up Recommendations  Skilled nursing facility    Barriers to Discharge        Equipment Recommendations  None recommended by PT    Recommendations for Other Services    Frequency Min 3X/week   Plan Discharge plan remains appropriate;Frequency remains appropriate    Precautions / Restrictions Precautions Precautions: Fall Restrictions Weight Bearing Restrictions: No   Pertinent Vitals/Pain No c/o pain    Mobility  Bed Mobility Bed Mobility: Supine to Sit;Sitting - Scoot to Edge of Bed Supine to Sit: 3: Mod assist;HOB elevated Supine to Sit: Patient Percentage: 50% Sitting - Scoot to Edge of Bed: 3: Mod assist Sitting - Scoot to Edge of Bed: Patient Percentage: 50% Sit to Supine: Not Tested (comment) Scooting to [facility]: Not tested (comment) Details for Bed Mobility Assistance: Pt required step by step cueing for sequencing movements. Assist raise shoulders from bed secondary to generalized weakness.  Transfers Transfers: Sit to Stand;Stand to [employer] Transfers Sit to Stand: 2: Max assist;With upper extremity assist;From bed Stand to Sit: 2: Max assist;To chair/3-in-1;To bed Stand Pivot Transfers: 1: +2 Total assist Stand Pivot Transfers: Patient Percentage: 20% Details for Transfer Assistance: Assist to initiate standing and control descent to sit secondary to LE and UE weakness. Assist to perform pivotal transfer due to weakness in bilateral LEs. Manual facilitation to position bilateral LEs.   Ambulation/Gait Ambulation/Gait Assistance: Not tested (comment) Wheelchair Mobility Wheelchair Mobility: No    Exercises      PT Diagnosis:    PT Problem List:   PT Treatment Interventions:     PT Goals Acute Rehab PT Goals PT Goal Formulation: With family Time For Goal Achievement: 04/26/12 Potential to Achieve Goals: Good Pt will go Supine/Side to Sit: with min assist PT Goal: Supine/Side to Sit - Progress: Progressing toward goal Pt will go Sit to Supine/Side: with min assist PT Goal: Sit to Supine/Side - Progress: Progressing toward goal Pt will go Sit to Stand: with min assist PT Goal: Sit to Stand - Progress: Progressing toward goal Pt will go Stand to Sit: with min assist PT Goal: Stand to Sit - Progress: Progressing toward goal Pt will Ambulate: 51 - 150 feet;with min assist;with least restrictive assistive device  Visit Information  Last PT Received On: 04/13/12    Subjective Data  Subjective: Can this wait til tomorrow.  Patient Stated Goal: Unable to state any goals   Cognition  Overall Cognitive Status: Appears within functional limits for tasks assessed/performed Arousal/Alertness: Awake/alert Orientation Level: Appears intact for tasks assessed Behavior During Session: [facility] for tasks performed    Balance  Static Sitting Balance Static Sitting - Balance Support: Bilateral upper extremity supported;Feet unsupported Static Sitting - Level of Assistance: 2: Max assist Static Sitting - Comment/# of Minutes: Pt able to increase sitting time to >30 seconds. Falls to the right from fatigue.    End of Session PT - End of Session Equipment Utilized During Treatment: Gait belt Activity Tolerance: Patient limited by fatigue Patient left: in chair;with call bell/phone within reach;with family/visitor present Nurse Communication: Mobility status;Need for lift equipment [place] Dec steady lift.)   GP     Alferd Apa 04/13/2012, 2:55  PM Jessa Stinson L. Ancelmo Hunt DPT [phone]

## 2012-04-14 DIAGNOSIS — J9601 Acute respiratory failure with hypoxia: Secondary | ICD-10-CM | POA: Diagnosis not present

## 2012-04-14 DIAGNOSIS — J9600 Acute respiratory failure, unspecified whether with hypoxia or hypercapnia: Secondary | ICD-10-CM

## 2012-04-14 LAB — PROTIME-INR: INR: 2.59 — ABNORMAL HIGH (ref 0.00–1.49)

## 2012-04-14 LAB — CBC
HCT: 29.2 % — ABNORMAL LOW (ref 36.0–46.0)
Hemoglobin: 9.5 g/dL — ABNORMAL LOW (ref 12.0–15.0)
MCHC: 32.5 g/dL (ref 30.0–36.0)
MCV: 81.8 fL (ref 78.0–100.0)
RDW: 15.4 % (ref 11.5–15.5)

## 2012-04-14 LAB — GLUCOSE, CAPILLARY
Glucose-Capillary: 158 mg/dL — ABNORMAL HIGH (ref 70–99)
Glucose-Capillary: 130 mg/dL — ABNORMAL HIGH (ref 70–99)
Glucose-Capillary: 136 mg/dL — ABNORMAL HIGH (ref 70–99)
Glucose-Capillary: 139 mg/dL — ABNORMAL HIGH (ref 70–99)

## 2012-04-14 LAB — BASIC METABOLIC PANEL
BUN: 13 mg/dL (ref 6–23)
Creatinine, Ser: 0.7 mg/dL (ref 0.50–1.10)
GFR calc Af Amer: >90 mL/min (ref >90)
GFR calc non Af Amer: 80 mL/min — ABNORMAL LOW (ref >90)
Glucose, Bld: 132 mg/dL — ABNORMAL HIGH (ref 70–99)
Potassium: 4.2 mEq/L (ref 3.5–5.1)

## 2012-04-14 MED ORDER — INSULIN ASPART 100 UNIT/ML ~~LOC~~ SOLN
0.0000 [IU] | Freq: Three times a day (TID) | SUBCUTANEOUS | Status: DC
  Administered 2012-04-15: 2 [IU] via SUBCUTANEOUS
  Administered 2012-04-15 (×2): 3 [IU] via SUBCUTANEOUS
  Administered 2012-04-16: 2 [IU] via SUBCUTANEOUS
  Administered 2012-04-16 – 2012-04-17 (×4): 3 [IU] via SUBCUTANEOUS

## 2012-04-14 MED ORDER — POTASSIUM CHLORIDE CRYS ER 20 MEQ PO TBCR
EXTENDED_RELEASE_TABLET | ORAL | Status: AC
  Filled 2012-04-14: qty 2

## 2012-04-14 MED ORDER — DILTIAZEM HCL 100 MG IV SOLR: 5.0000 mg/h | INTRAVENOUS | Status: DC

## 2012-04-14 MED ORDER — INSULIN ASPART 100 UNIT/ML ~~LOC~~ SOLN: 0.0000 [IU] | Freq: Every day | SUBCUTANEOUS | Status: DC

## 2012-04-14 MED ORDER — POTASSIUM CHLORIDE CRYS ER 20 MEQ PO TBCR
40.0000 meq | EXTENDED_RELEASE_TABLET | Freq: Two times a day (BID) | ORAL | Status: AC
  Administered 2012-04-14 (×2): 40 meq via ORAL
  Filled 2012-04-14: qty 2

## 2012-04-14 MED ORDER — FUROSEMIDE 10 MG/ML IJ SOLN
40.0000 mg | Freq: Two times a day (BID) | INTRAMUSCULAR | Status: AC
  Administered 2012-04-14 (×2): 40 mg via INTRAVENOUS
  Filled 2012-04-14: qty 4

## 2012-04-14 MED ORDER — WARFARIN 0.5 MG HALF TABLET
0.5000 mg | ORAL_TABLET | Freq: Once | ORAL | Status: AC
  Administered 2012-04-14: 0.5 mg via ORAL
  Filled 2012-04-14: qty 1

## 2012-04-14 MED ORDER — DILTIAZEM 12 MG/ML ORAL SUSPENSION
30.0000 mg | Freq: Four times a day (QID) | ORAL | Status: DC
  Administered 2012-04-14 – 2012-04-18 (×16): 30 mg via ORAL
  Filled 2012-04-14 (×20): qty 3

## 2012-04-14 NOTE — Progress Notes (Signed)
[facility] [place] TEAM 1 - Stepdown/ICU TEAM  Interim history: [name] [name] is a 78 y.o. female, with history of hypertension, atrial fibrillation, COPD, chronic back pain, recent pancreatitis, who recently moved to her house from a skilled nursing facility who was brought in by her husband as for the last 3-4 days she has showed gradually progressive weakness and lethargy, and she started to run fevers, she denied any chest pain cough or abdominal pain, she was brought in the ER where she was found to be in early sepsis secondary to UTI, atrial fibrillation with RVR, dry oral mucosa and evidence of dehydration.  Subjective: She is alert today-  primarily complaining of shortness of breath orthopnea. States is hungry and would like to try solid food.  Objective: Blood pressure 132/49, pulse 9, temperature 98.6 F (37 C), temperature source Oral, resp. rate 23, height 5' (1.524 m), weight 59.5 kg (131 lb 2.8 oz), SpO2 91.00%.  Intake/Output from previous day: 07/10 0701 - 07/11 0700 In: 2957.5 [P.O.:1340; I.V.:1317.5; IV Piggyback:300] Out: -  Intake/Output this shift:    General appearance: cooperative, appears older than stated age, no distress Resp: Bilateral crackles greater on the left, respiratory effort becomes labored with activity and patient has minimal tachypnea but is still requiring 2 L nasal cannula oxygen Cardio: regular rate and atrial fibrillation rhythm ventricular response between 87 and 117 with occasional peaks up into the 120, on IV Cardizem infusion, the fluids infusing at 75 cc an hour S1, S2 normal, no murmur, click, rub or gallop, no peripheral edema GI: soft, non-tender; bowel sounds normal; no masses,  no organomegaly, rectal foley in place with brown sediment - liquid but not watery stool Musculoskeletal: extremities normal, atraumatic without any evidence of erythema or joint effusion Neurologic: Grossly normal   Lab Results:  Basename  04/14/12 0525 04/12/12 0938  WBC 13.8* 14.2*  HGB 9.5* 9.4*  HCT 29.2* 29.6*  PLT 206 181   BMET  Basename 04/14/12 0525 04/13/12 0542  NA 135 135  K 4.2 3.1*  CL 104 105  CO2 18* 18*  GLUCOSE 132* 122*  BUN 13 16  CREATININE 0.70 0.85  CALCIUM 8.7 8.3*    Studies/Results: Reveiwed  Medications: I have reviewed the patient's current medications.  Assessment/Plan:  Acute hypoxemic respiratory failure secondary to volume overload *Patient was aggressively rehydrated when she presented and is now hypervolemic and symptomatic *Continue supportive care with oxygen *Give Lasix 40 mg IV x2 doses with supplemental potassium to prevent hypokalemia *d/c IV fluids   Sepsis *Likely source secondary to underlying urinary tract infection *Treat underlying causes and provide supportive care *Currently is hemodynamically stable although did spike another fever this morning *identification of Escherichia coli in urinary tract which is pansensitive - blood cultures are pending  E-coli UTI *Greater than 100,000 colonies pan sensitive - on Rocephin day #3  Atrial fibrillation with RVR *Occasional bursts of tachycardia into the 120s but suspect this is associated with patient's acute respiratory distress-  with correction of this heart rate will be much better controlled *Discontinue IV Cardizem in favor of oral Cardizem but since blood pressure soft and utilize elixir 30 mg every 6 hours with holding parameter *Continue digoxin and low-dose metoprolol  Warfarin anticoagulation *INR subtherapeutic at presentation *Pharmacy managing dosing  Dehydration *Resolved and now hypervolemic *Probably has a degree of chronic dehydration given the chronic loose stools *Made worse by poor intake prior to admission *discontinue IV fluid hydration   DM II (diabetes  mellitus, type II), controlled *CBGs are beginning to increase somewhat after initiation of clear liquid diet *Continue sliding  scale insulin but if blood sugars continue to increase may need to increase to moderate scale *Was on Amaryl prior to admission - hold until tolerating solid diet * advance sliding scale to moderate dose  HTN (hypertension) *Blood pressure actually somewhat low and suboptimal so we'll be cautious with utilizing any antihypertensive agent  Metabolic encephalopathy (mild) *Secondary to acute infection and high fevers-treat underlying causes *Neuro exam nonfocal  Chronic epigastric pain/ Irritable bowel disease/Chronic diarrhea   Disposition *Remain in step down until BP and HR/rythm more stable and until acute hypoxemic respiratory failure has been adequately treated   LOS: 3 days   Junious Silk, ANP pager [phone]  [facility] 1 [email or web address] Password: [email or web address]  04/14/2012, 1:02 PM   I have examined the patient, reviewed the chart and modified the above note which I agree with. It appears HR is improving with diuresis- will cont to follow in SDU today.

## 2012-04-14 NOTE — Progress Notes (Signed)
ANTICOAGULATION CONSULT NOTE - Follow Up Consult  Pharmacy Consult:  Coumadin Indication: atrial fibrillation  Allergies  Allergen Reactions  . Lactose Intolerance (Gi) Nausea And Vomiting    severe stomach pain  . Penicillins Hives and Itching    "haven't used any since 1970's"  . Sulfa Antibiotics Hives and Itching    "haven't used any since 1970's"    Patient Measurements: Height: 5' (152.4 cm) Weight: 131 lb 2.8 oz (59.5 kg) IBW/kg (Calculated) : 45.5   Vital Signs: Temp: 98.5 F (36.9 C) (07/11 0821) Temp src: Oral (07/11 0821) BP: 128/67 mmHg (07/11 0821) Pulse Rate: 98  (07/11 0821)  Labs:  Basename 04/14/12 0525 04/13/12 0542 04/12/12 4098 04/12/12 0817 04/11/12 1116  HGB 9.5* -- 9.4* -- --  HCT 29.2* -- 29.6* 29.1* --  PLT 206 -- 181 170 --  APTT -- -- -- -- 45*  LABPROT 28.2* 21.4* -- 19.6* --  INR 2.59* 1.82* -- 1.63* --  HEPARINUNFRC -- -- -- -- --  CREATININE 0.70 0.85 -- 0.85 --  CKTOTAL -- -- -- -- --  CKMB -- -- -- -- --  TROPONINI -- -- -- -- --    Estimated Creatinine Clearance: 45.2 ml/min (by C-G formula based on Cr of 0.7).       Assessment: 78 YO nursing home patient with complicated medical history admitted with progressive weakness, lethargy, and fever. MD suspected early sepsis due to UTI and Merrem started on 04/11/12; vancomycin added on 04/12/12 for continued fever. Vanc/Merrem changed to Rocephin on 04/13/12.  Patient with stable renal function.  Pt also with hx of afib, on chronic anticoagulation with Coumadin. Prior to admission INR was supratherapeutic on 04/08/12. Per pt's husband, due to high INR, patient was holding  (7/7) Coumadin dose, and was supposed to hold 7/8 's dose as well. They were then supposed to take 2.5 mg daily except 5 mg on Thursdays. Now INR is 2.59 after 2.5, 4, 4 mg doses. Large jump in INR.  CBC stable. No bleeding reported.   Merrem 7/8 >>7/0 Vanc 7/9 >>7/10 Ceftriaxone 7/10>>  7/8 urine cx - E.coli,  pan-sensitive 7/8 blood cx - NGTD    Goal of Therapy:  INR 2-3    Plan:  - coumadin 0.5 mg po x 1 dose today -daily INR - Consider resuming home meds as appropriate: Ritalin, Xalatan, Zoloft, glimepiride, Lasix, multivitamin Herby Abraham, Pharm.D. [phone] 04/14/2012 10:50 AM

## 2012-04-14 NOTE — Progress Notes (Signed)
Inpatient Diabetes Program Recommendations  AACE/ADA: New Consensus Statement on Inpatient Glycemic Control (2009)  Target Ranges:  Prepandial:   less than 140 mg/dL      Peak postprandial:   less than 180 mg/dL (1-2 hours)      Critically ill patients:  140 - 180 mg/dL    Results for [name] [name], [name] [name] (MRN [id number]) as of 04/14/2012 15:25  Ref. Range 04/12/2012 23:45 04/13/2012 03:56 04/13/2012 07:56 04/13/2012 11:38 04/13/2012 16:01 04/13/2012 20:12  Glucose-Capillary Latest Range: 70-99 mg/dL 578 (H) 469 (H) 97 629 (H) 256 (H) 236 (H)   Results for [name] [name], [name] [name] (MRN [id number]) as of 04/14/2012 15:25  Ref. Range 04/14/2012 00:16 04/14/2012 03:30 04/14/2012 08:21 04/14/2012 11:35  Glucose-Capillary Latest Range: 70-99 mg/dL 010 (H) 272 (H) 536 (H) 219 (H)    Inpatient Diabetes Program Recommendations Correction (SSI): Please increase Novolog SSI to Moderate scale Q4 hours.  Note: Will follow. Ambrose Finland RN, MSN, CDE Diabetes Coordinator Inpatient Diabetes Program [phone]

## 2012-04-15 LAB — CBC
Hemoglobin: 8.7 g/dL — ABNORMAL LOW (ref 12.0–15.0)
RBC: 3.24 MIL/uL — ABNORMAL LOW (ref 3.87–5.11)

## 2012-04-15 LAB — GLUCOSE, CAPILLARY
Glucose-Capillary: 129 mg/dL — ABNORMAL HIGH (ref 70–99)
Glucose-Capillary: 180 mg/dL — ABNORMAL HIGH (ref 70–99)

## 2012-04-15 LAB — BASIC METABOLIC PANEL
Chloride: 102 mEq/L (ref 96–112)
GFR calc Af Amer: >90 mL/min (ref >90)
GFR calc non Af Amer: 80 mL/min — ABNORMAL LOW (ref >90)
Glucose, Bld: 126 mg/dL — ABNORMAL HIGH (ref 70–99)
Potassium: 4 mEq/L (ref 3.5–5.1)
Sodium: 137 mEq/L (ref 135–145)

## 2012-04-15 LAB — PROTIME-INR: Prothrombin Time: 29.4 seconds — ABNORMAL HIGH (ref 11.6–15.2)

## 2012-04-15 MED ORDER — WARFARIN SODIUM 2.5 MG PO TABS
2.5000 mg | ORAL_TABLET | Freq: Once | ORAL | Status: AC
  Administered 2012-04-15: 2.5 mg via ORAL
  Filled 2012-04-15 (×2): qty 1

## 2012-04-15 MED ORDER — FUROSEMIDE 40 MG PO TABS
40.0000 mg | ORAL_TABLET | ORAL | Status: DC
  Administered 2012-04-15 – 2012-04-18 (×2): 40 mg via ORAL
  Filled 2012-04-15 (×2): qty 1

## 2012-04-15 NOTE — Progress Notes (Signed)
Physical Therapy Treatment Patient Details Name: [name] [name] MRN: [id number] DOB: 08/23/1930 Today's Date: 04/15/2012 Time: 1478-2956 PT Time Calculation (min): 17 min  PT Assessment / Plan / Recommendation Comments on Treatment Session  Pt continues to be limited by fatigue and generalized weakness acute PT will contiue to follow pt.      Follow Up Recommendations  Skilled nursing facility    Barriers to Discharge        Equipment Recommendations  None recommended by PT    Recommendations for Other Services    Frequency Min 3X/week   Plan Discharge plan remains appropriate;Frequency remains appropriate    Precautions / Restrictions Precautions Precautions: Fall Restrictions Weight Bearing Restrictions: No       Mobility  Bed Mobility Bed Mobility: Supine to Sit;Sitting - Scoot to Edge of Bed Supine to Sit: 3: Mod assist;HOB elevated Sitting - Scoot to [place] of Bed: 4: Min guard Details for Bed Mobility Assistance: Verbal cues for technique, and to use rail rather than pull up on therapist. Transfers Transfers: Sit to Stand;Stand to Sit Sit to Stand: 3: Mod assist;With upper extremity assist;From bed Stand to Sit: 3: Mod assist;With upper extremity assist;With armrests;To chair/3-in-1 Details for Transfer Assistance: Assist to initiate movement, then patient completed the transfer.  Ambulation/Gait Ambulation/Gait Assistance: 1: +2 Total assist Ambulation/Gait: Patient Percentage: 70% Ambulation Distance (Feet): 4 Feet Assistive device: Rolling walker Ambulation/Gait Assistance Details: Patient leaning posteriorly.  Cues to lean forward onto RW.  Assist to maneuver RW.  +1 to push chair behind patient for safety.   Gait Pattern: Step-through pattern;Decreased stride length;Shuffle;Trunk flexed Gait velocity: Slow gait speed     PT Goals Acute Rehab PT Goals PT Goal: Supine/Side to Sit - Progress: Progressing toward goal PT Goal: Sit to Stand - Progress:  Progressing toward goal PT Goal: Stand to Sit - Progress: Progressing toward goal PT Goal: Ambulate - Progress: Progressing toward goal  Visit Information  Last PT Received On: 04/15/12 Assistance Needed: +2    Subjective Data  Subjective: "Oh no"  As PT introduced self.   Cognition  Overall Cognitive Status: Appears within functional limits for tasks assessed/performed Arousal/Alertness: Awake/alert Orientation Level: Appears intact for tasks assessed Behavior During Session: [facility] for tasks performed    Balance     End of Session PT - End of Session Equipment Utilized During Treatment: Gait belt Activity Tolerance: Patient limited by fatigue Patient left: in chair;with call bell/phone within reach;with family/visitor present Nurse Communication: Mobility status   GP     [name] [name] 04/15/2012, 6:02 PM Durenda Hurt. Renaldo Fiddler, [facility] Acute Rehab Services Pager [phone]

## 2012-04-15 NOTE — Progress Notes (Signed)
ANTICOAGULATION CONSULT NOTE - Follow Up Consult  Pharmacy Consult:  Coumadin Indication: atrial fibrillation  Allergies  Allergen Reactions  . Lactose Intolerance (Gi) Nausea And Vomiting    severe stomach pain  . Penicillins Hives and Itching    "haven't used any since 1970's"  . Sulfa Antibiotics Hives and Itching    "haven't used any since 1970's"    Patient Measurements: Height: 5' (152.4 cm) Weight: 126 lb 15.8 oz (57.6 kg) IBW/kg (Calculated) : 45.5   Vital Signs: Temp: 98.9 F (37.2 C) (07/12 0745) Temp src: Oral (07/12 0745) BP: 120/45 mmHg (07/12 0745) Pulse Rate: 94  (07/12 0745)  Labs:  Basename 04/15/12 0525 04/14/12 0525 04/13/12 0542  HGB 8.7* 9.5* --  HCT 26.3* 29.2* --  PLT 224 206 --  APTT -- -- --  LABPROT 29.4* 28.2* 21.4*  INR 2.73* 2.59* 1.82*  HEPARINUNFRC -- -- --  CREATININE 0.70 0.70 0.85  CKTOTAL -- -- --  CKMB -- -- --  TROPONINI -- -- --    Estimated Creatinine Clearance: 44.5 ml/min (by C-G formula based on Cr of 0.7).       Assessment: 78 YO nursing home patient with complicated medical history admitted with progressive weakness, lethargy, and fever. MD suspected early sepsis due to UTI and Merrem started on 04/11/12; vancomycin added on 04/12/12 for continued fever. Vanc/Merrem changed to Rocephin on 04/13/12.  Patient with stable renal function.  Pt also with hx of afib, on chronic anticoagulation with Coumadin. Prior to admission INR was supratherapeutic on 04/08/12. Per pt's husband, due to high INR, patient was holding  (7/7) Coumadin dose, and was supposed to hold 7/8 's dose as well. They were then supposed to take 2.5 mg daily except 5 mg on Thursdays. Now INR is 2.73 after 2.5, 4, 4, 0.5 mg doses. INR therapeutic.  CBC stable. No bleeding reported.   Merrem 7/8 >>7/0 Vanc 7/9 >>7/10 Ceftriaxone 7/10>>  7/8 urine cx - E.coli, pan-sensitive 7/8 blood cx - NGTD    Goal of Therapy:  INR 2-3    Plan:  - coumadin 2.5 mg po  x 1 dose today -daily INR - Consider resuming home meds as appropriate: Ritalin, Xalatan, Zoloft, glimepiride, Lasix, multivitamin Herby Abraham, Pharm.D. [phone] 04/15/2012 10:49 AM

## 2012-04-15 NOTE — Progress Notes (Signed)
CSW received report from colleague csw of pt transfer to [id number]. CSW will continue to follow to assist with pt discharge plan to [facility] when medically stable. .Clinical social worker continuing to follow pt to assist with pt dc plans and further csw needs.   Catha Gosselin, Theresia Majors  [phone] .04/15/2012 1416pm

## 2012-04-15 NOTE — Progress Notes (Signed)
[facility] [place] TEAM 1 - Stepdown/ICU TEAM  PCP: Rocky Morel, MD  CODE STATUS: DO NOT RESUSCITATE  Family communication: I spoke briefly with the patient's husband on the nursing unit 04/15/2012 and updated him on his wife's status and concerns over her confusion but that overall she was improving and appropriate for discharge from the step down unit.  Interim history: [name] [name] is a 78 y.o. female, with history of hypertension, atrial fibrillation, COPD, chronic back pain, recent pancreatitis, who recently moved to her house from a skilled nursing facility who was brought in by her husband as for the last 3-4 days she has showed gradually progressive weakness and lethargy, and she started to run fevers, she denied any chest pain cough or abdominal pain, she was brought in the ER where she was found to be in early sepsis secondary to UTI, atrial fibrillation with RVR, dry oral mucosa and evidence of dehydration.  Subjective: She is alert but definitely confused but pleasant. Previous dyspnea has resolved. She is tolerating solid diet.  Objective: Blood pressure 120/45, pulse 94, temperature 98.9 F (37.2 C), temperature source Oral, resp. rate 19, height 5' (1.524 m), weight 57.6 kg (126 lb 15.8 oz), SpO2 95.00%.  Intake/Output from previous day: 07/11 0701 - 07/12 0700 In: 350 [P.O.:300; IV Piggyback:50] Out: -  Intake/Output this shift: Total I/O In: 120 [P.O.:120] Out: -   General appearance: cooperative, no distress Resp: Bilateral crackles greater on the left, no wheeze Cardio: regular rate and atrial fibrillation rhythm - no murmur, click, rub or gallop, no peripheral edema GI: soft, non-tender; bowel sounds normal; no masses,  no organomegaly Musculoskeletal: extremities normal, atraumatic without any evidence of erythema or joint effusion Neurologic: Grossly normal - somewhat confused  Lab Results:  Basename 04/15/12 0525 04/14/12 0525  WBC  13.8* 13.8*  HGB 8.7* 9.5*  HCT 26.3* 29.2*  PLT 224 206   BMET  Basename 04/15/12 0525 04/14/12 0525  NA 137 135  K 4.0 4.2  CL 102 104  CO2 24 18*  GLUCOSE 126* 132*  BUN 11 13  CREATININE 0.70 0.70  CALCIUM 8.9 8.7    Studies/Results: Reveiwed  Medications: I have reviewed the patient's current medications.  Assessment/Plan:  Acute hypoxemic respiratory failure secondary to volume overload *Patient was aggressively rehydrated when she presented and later developed respiratory symptoms related to volume overload. She was subsequently treated with 2 doses of Lasix 40 mg IV and symptoms resolved *Continue supportive care with oxygen  Sepsis *Resolved *Likely source secondary to underlying urinary tract infection *Treat underlying causes and provide supportive care *identification of Escherichia coli in urinary tract which is pansensitive - blood cultures are pending  E-coli UTI *Greater than 100,000 colonies pan sensitive - on Rocephin day #4  Atrial fibrillation with RVR *Still with very subtle tachycardia but not as significant as previously *Continue oral Cardizem  *Continue digoxin and lower dose of metoprolol due to some nagging hypotension  Warfarin anticoagulation *INR subtherapeutic at presentation *Pharmacy managing dosing  Dehydration *Resolved and now euvolemic *Probably has a degree of chronic dehydration given the chronic loose stools *Made worse by poor intake prior to admission  DM II (diabetes mellitus, type II), controlled *CBGs much better controlled after adjustment in medications on 04/14/2012 *Continue sliding scale insulin but if blood sugars continue to increase may need to increase to moderate scale *Was on Amaryl prior to admission - tolerating solid diet so will resume today  HTN (hypertension) *Blood pressure actually somewhat  low and suboptimal so we'll be cautious with utilizing any antihypertensive agents  Metabolic  encephalopathy (mild) *Secondary to acute infection and high fevers-treat underlying causes *Neuro exam nonfocal  Chronic epigastric pain/ Irritable bowel disease/Chronic diarrhea C diff negative  Disposition *Transfer to telemetry *Continue physical therapy and occupational therapy *Recommendation at discharge is for skilled nursing facility - patient recently had discharge back to home after undergoing rehabilitation at the skilled nursing facility prior to this admission   LOS: 4 days   Junious Silk, ANP pager [phone]  [facility] [email or web address] Password: [email or web address]  04/15/2012, 10:57 AM  I have personally examined this patient and reviewed the entire database. I have reviewed the above note, made any necessary editorial changes, and agree with its content.  Lonia Blood, MD [facility]

## 2012-04-16 DIAGNOSIS — E119 Type 2 diabetes mellitus without complications: Secondary | ICD-10-CM

## 2012-04-16 LAB — CBC
MCH: 26.3 pg (ref 26.0–34.0)
MCHC: 32.2 g/dL (ref 30.0–36.0)
MCV: 81.7 fL (ref 78.0–100.0)
Platelets: 313 10*3/uL (ref 150–400)
RDW: 15.1 % (ref 11.5–15.5)

## 2012-04-16 LAB — GLUCOSE, CAPILLARY: Glucose-Capillary: 179 mg/dL — ABNORMAL HIGH (ref 70–99)

## 2012-04-16 MED ORDER — METOPROLOL TARTRATE 25 MG PO TABS
25.0000 mg | ORAL_TABLET | Freq: Two times a day (BID) | ORAL | Status: DC
  Administered 2012-04-16 – 2012-04-18 (×4): 25 mg via ORAL
  Filled 2012-04-16 (×6): qty 1

## 2012-04-16 MED ORDER — GLIMEPIRIDE 4 MG PO TABS
4.0000 mg | ORAL_TABLET | Freq: Two times a day (BID) | ORAL | Status: DC
  Administered 2012-04-16 – 2012-04-18 (×4): 4 mg via ORAL
  Filled 2012-04-16 (×7): qty 1

## 2012-04-16 MED ORDER — WARFARIN SODIUM 2.5 MG PO TABS
2.5000 mg | ORAL_TABLET | Freq: Once | ORAL | Status: AC
  Administered 2012-04-16: 2.5 mg via ORAL
  Filled 2012-04-16: qty 1

## 2012-04-16 MED ORDER — ADULT MULTIVITAMIN W/MINERALS CH
1.0000 | ORAL_TABLET | Freq: Every day | ORAL | Status: DC
  Administered 2012-04-16 – 2012-04-18 (×3): 1 via ORAL
  Filled 2012-04-16 (×4): qty 1

## 2012-04-16 MED ORDER — FUROSEMIDE 40 MG PO TABS
40.0000 mg | ORAL_TABLET | Freq: Once | ORAL | Status: AC
  Administered 2012-04-16: 40 mg via ORAL
  Filled 2012-04-16 (×2): qty 1

## 2012-04-16 MED ORDER — METHYLPHENIDATE HCL 5 MG PO TABS
5.0000 mg | ORAL_TABLET | Freq: Two times a day (BID) | ORAL | Status: DC
  Administered 2012-04-16 – 2012-04-18 (×4): 5 mg via ORAL
  Filled 2012-04-16 (×4): qty 1

## 2012-04-16 MED ORDER — SERTRALINE HCL 50 MG PO TABS
50.0000 mg | ORAL_TABLET | Freq: Every evening | ORAL | Status: DC
  Administered 2012-04-16 – 2012-04-17 (×2): 50 mg via ORAL
  Filled 2012-04-16 (×4): qty 1

## 2012-04-16 MED ORDER — LATANOPROST 0.005 % OP SOLN
1.0000 [drp] | Freq: Every day | OPHTHALMIC | Status: DC
  Administered 2012-04-16 – 2012-04-17 (×2): 1 [drp] via OPHTHALMIC
  Filled 2012-04-16 (×2): qty 2.5

## 2012-04-16 MED ORDER — CEFUROXIME AXETIL 500 MG PO TABS
500.0000 mg | ORAL_TABLET | Freq: Two times a day (BID) | ORAL | Status: DC
  Administered 2012-04-16 – 2012-04-18 (×4): 500 mg via ORAL
  Filled 2012-04-16 (×7): qty 1

## 2012-04-16 NOTE — Progress Notes (Signed)
ANTICOAGULATION CONSULT NOTE - Follow Up Consult  Pharmacy Consult:  Coumadin Indication: atrial fibrillation  Allergies  Allergen Reactions  . Lactose Intolerance (Gi) Nausea And Vomiting    severe stomach pain  . Penicillins Hives and Itching    "haven't used any since 1970's"  . Sulfa Antibiotics Hives and Itching    "haven't used any since 1970's"    Patient Measurements: Height: 5' (152.4 cm) Weight: 130 lb (58.968 kg) IBW/kg (Calculated) : 45.5   Vital Signs: Temp: 98.3 F (36.8 C) (07/13 0500) Temp src: Oral (07/13 0500) BP: 118/67 mmHg (07/13 0500) Pulse Rate: 90  (07/13 0500)  Labs:  Basename 04/16/12 0630 04/15/12 0525 04/14/12 0525  HGB 9.2* 8.7* --  HCT 28.6* 26.3* 29.2*  PLT 313 224 206  APTT -- -- --  LABPROT 27.6* 29.4* 28.2*  INR 2.52* 2.73* 2.59*  HEPARINUNFRC -- -- --  CREATININE -- 0.70 0.70  CKTOTAL -- -- --  CKMB -- -- --  TROPONINI -- -- --    Estimated Creatinine Clearance: 45.1 ml/min (by C-G formula based on Cr of 0.7).       Assessment: 78 YO nursing home patient with complicated medical history admitted on 7/8 with progressive weakness, lethargy, and fever. MD suspected early sepsis.  Coumadin for Afib (2.5mg  daily except 5mg  Thurs). INR today 2.52 (7/13).     Goal of Therapy:  INR 2-3    Plan:  - coumadin 2.5 mg po x 1 dose today -daily INR - Consider resuming home meds as appropriate: Ritalin, Xalatan, Zoloft, glimepiride, multivitamin  Doris Cheadle, PharmD Clinical Pharmacist Pager: [phone] Phone: [phone] 04/16/2012 8:18 AM

## 2012-04-16 NOTE — Progress Notes (Signed)
Subjective: Feeling better.  Wondering why she still here in the hospital.  Objective: Vital signs in last 24 hours: Filed Vitals:   04/16/12 0500 04/16/12 1119 04/16/12 1121 04/16/12 1300  BP: 118/67  130/61 125/77  Pulse: 90 100  110  Temp: 98.3 F (36.8 C)   98.9 F (37.2 C)  TempSrc: Oral     Resp: 18   20  Height:      Weight: 58.968 kg (130 lb)     SpO2: 94%   93%   Weight change: 1.368 kg (3 lb 0.2 oz)  Intake/Output Summary (Last 24 hours) at 04/16/12 1542 Last data filed at 04/16/12 0900  Gross per 24 hour  Intake    360 ml  Output      1 ml  Net    359 ml    Physical Exam: General: Awake, No acute distress. HEENT: EOMI. Neck: Supple CV: S1 and S2 Lungs: Clear to ascultation bilaterally Abdomen: Soft, Nontender, Nondistended, +bowel sounds. Ext: Good pulses. Trace edema.  Lab Results: Basic Metabolic Panel:  Lab 04/15/12 8413 04/14/12 0525 04/13/12 0542 04/12/12 0817 04/11/12 1116  NA 137 135 135 139 137  K 4.0 4.2 3.1* 3.4* 3.3*  CL 102 104 105 107 100  CO2 24 18* 18* 19 22  GLUCOSE 126* 132* 122* 100* 183*  BUN 11 13 16 14 14   CREATININE 0.70 0.70 0.85 0.85 0.92  CALCIUM 8.9 8.7 8.3* 8.2* 8.8  MG -- -- -- -- --  PHOS -- -- -- -- --   Liver Function Tests:  Lab 04/11/12 1116  AST 20  ALT 10  ALKPHOS 68  BILITOT 0.8  PROT 6.6  ALBUMIN 3.1*   No results found for this basename: LIPASE:5,AMYLASE:5 in the last 168 hours No results found for this basename: AMMONIA:5 in the last 168 hours CBC:  Lab 04/16/12 0630 04/15/12 0525 04/14/12 0525 04/12/12 0938 04/12/12 0817 04/11/12 1116  WBC 15.5* 13.8* 13.8* 14.2* 12.4* --  NEUTROABS -- -- -- -- -- 13.9*  HGB 9.2* 8.7* 9.5* 9.4* 9.4* --  HCT 28.6* 26.3* 29.2* 29.6* 29.1* --  MCV 81.7 81.2 81.8 83.1 83.1 --  PLT 313 224 206 181 170 --   Cardiac Enzymes: No results found for this basename: CKTOTAL:5,CKMB:5,CKMBINDEX:5,TROPONINI:5 in the last 168 hours BNP (last 3 results)  Basename 04/11/12  1116 03/13/12 1603  PROBNP 4175.0* 1505.0*   CBG:  Lab 04/16/12 1157 04/16/12 0725 04/15/12 2050 04/15/12 1631 04/15/12 1129  GLUCAP 170* 132* 166* 180* 194*   No results found for this basename: HGBA1C:5 in the last 72 hours Other Labs: No components found with this basename: POCBNP:3  Lab 04/11/12 1116  DDIMER <0.22   No results found for this basename: CHOL:2,HDL:2,LDLCALC:2,TRIG:2,CHOLHDL:2,LDLDIRECT:2 in the last 168 hours No results found for this basename: TSH,T4TOTAL,FREET3,T3FREE,FREET4,THYROIDAB in the last 168 hours  Lab 04/11/12 1116  VITAMINB12 --  FOLATE --  FERRITIN --  TIBC --  IRON --  RETICCTPCT 2.1    Micro Results: Recent Results (from the past 240 hour(s))  CULTURE, BLOOD (ROUTINE X 2)     Status: Normal (Preliminary result)   Collection Time   04/11/12 11:30 AM      Component Value Range Status Comment   Specimen Description BLOOD RIGHT ARM   Final    Special Requests BOTTLES DRAWN AEROBIC AND ANAEROBIC 10CC   Final    Culture  Setup Time 04/11/2012 17:29   Final    Culture  Final    Value:        BLOOD CULTURE RECEIVED NO GROWTH TO DATE CULTURE WILL BE HELD FOR 5 DAYS BEFORE ISSUING A FINAL NEGATIVE REPORT   Report Status PENDING   Incomplete   CULTURE, BLOOD (ROUTINE X 2)     Status: Normal (Preliminary result)   Collection Time   04/11/12 11:40 AM      Component Value Range Status Comment   Specimen Description BLOOD RIGHT HAND   Final    Special Requests BOTTLES DRAWN AEROBIC ONLY 10CC   Final    Culture  Setup Time 04/11/2012 17:29   Final    Culture     Final    Value:        BLOOD CULTURE RECEIVED NO GROWTH TO DATE CULTURE WILL BE HELD FOR 5 DAYS BEFORE ISSUING A FINAL NEGATIVE REPORT   Report Status PENDING   Incomplete   URINE CULTURE     Status: Normal   Collection Time   04/11/12 11:56 AM      Component Value Range Status Comment   Specimen Description URINE, CATHETERIZED   Final    Special Requests NONE   Final    Culture  Setup  Time 04/11/2012 12:25   Final    Colony Count >=100,000 COLONIES/ML   Final    Culture ESCHERICHIA COLI   Final    Report Status 04/12/2012 FINAL   Final    Organism ID, Bacteria ESCHERICHIA COLI   Final   MRSA PCR SCREENING     Status: Normal   Collection Time   04/11/12  5:40 PM      Component Value Range Status Comment   MRSA by PCR NEGATIVE  NEGATIVE Final   CLOSTRIDIUM DIFFICILE BY PCR     Status: Normal   Collection Time   04/12/12  5:20 PM      Component Value Range Status Comment   C difficile by pcr NEGATIVE  NEGATIVE Final     Studies/Results: No results found.  Medications: I have reviewed the patient's current medications. Scheduled Meds:   . atorvastatin  20 mg Oral q1800  . cefTRIAXone (ROCEPHIN)  IV  1 g Intravenous Q24H  . digoxin  0.0625 mg Oral Q M,W,F  . digoxin  0.125 mg Oral Q T,Th,S,Su  . diltiazem  30 mg Oral Q6H  . feeding supplement  237 mL Oral BID BM  . furosemide  40 mg Oral 3 times weekly  . glimepiride  4 mg Oral BID WC  . insulin aspart  0-15 Units Subcutaneous TID WC  . insulin aspart  0-5 Units Subcutaneous QHS  . latanoprost  1 drop Both Eyes QHS  . methylphenidate  5 mg Oral BID  . metoprolol tartrate  12.5 mg Oral BID  . multivitamin with minerals  1 tablet Oral Daily  . OLANZapine  5 mg Oral QHS  . pantoprazole  40 mg Oral Q1200  . sertraline  50 mg Oral QPM  . warfarin  2.5 mg Oral ONCE-1800  . warfarin  2.5 mg Oral ONCE-1800  . Warfarin - Pharmacist Dosing Inpatient   Does not apply q1800   Continuous Infusions:  PRN Meds:.acetaminophen, ALPRAZolam, guaiFENesin-dextromethorphan, HYDROcodone-acetaminophen, loperamide, metoprolol, ondansetron (ZOFRAN) IV, ondansetron  Assessment/Plan: Acute hypoxemic respiratory failure secondary to volume overload  Patient was aggressively rehydrated when she presented and later developed respiratory symptoms related to volume overload, was given lasix two doses. Now on furosemide on M, W, F, will  given one  dose of oral lasix today. Continue supportive care.   Sepsis  Resolved. Likely source secondary to underlying urinary tract infection. Escherichia coli in urinary tract infection is pansensitive, blood cultures show no growth to date.   E-coli UTI  Greater than 100,000 colonies pan sensitive. On ceftriaxone, antibiotic since 04/11/2012.  Transitioned to cefuroxime, treat for total of 10 days.    Atrial fibrillation with RVR  Still with very subtle tachycardia but stable if heart rate of 110. Continue oral Cardizem, and metoprolol.  Also continue digoxin.  Anticoagulated on warfarin.  Warfarin anticoagulation  INR subtherapeutic. Pharmacy managing dosing.  Dehydration  Resolved and now euvolemic. Probably has a degree of chronic dehydration given the chronic loose stools. Made worse by poor intake prior to admission.  DM II (diabetes mellitus, type II), controlled  CBGs much better controlled after adjustment in medications on 04/14/2012. Continue sliding scale insulin. Resume glimepiride.   HTN (hypertension)  Blood pressure actually somewhat low and suboptimal so we'll be cautious with utilizing any antihypertensive agents   Metabolic encephalopathy (mild)  Secondary to acute infection and high fevers-treat underlying causes. Neuro exam nonfocal.  Chronic epigastric pain/ Irritable bowel disease/Chronic diarrhea  C diff negative   Disposition  Transfer to telemetry. Continue physical therapy and occupational therapy. DC to SNF on 04/18/2012 once bed is available.    LOS: 5 days  [name] [name] A, MD 04/16/2012, 3:42 PM

## 2012-04-17 LAB — CULTURE, BLOOD (ROUTINE X 2): Culture: NO GROWTH

## 2012-04-17 LAB — BASIC METABOLIC PANEL
BUN: 10 mg/dL (ref 6–23)
Creatinine, Ser: 0.65 mg/dL (ref 0.50–1.10)
GFR calc Af Amer: >90 mL/min (ref >90)
GFR calc non Af Amer: 82 mL/min — ABNORMAL LOW (ref >90)
Potassium: 3.6 mEq/L (ref 3.5–5.1)

## 2012-04-17 LAB — CBC
HCT: 29 % — ABNORMAL LOW (ref 36.0–46.0)
MCHC: 32.1 g/dL (ref 30.0–36.0)
Platelets: 396 10*3/uL (ref 150–400)
RDW: 14.9 % (ref 11.5–15.5)

## 2012-04-17 LAB — GLUCOSE, CAPILLARY
Glucose-Capillary: 158 mg/dL — ABNORMAL HIGH (ref 70–99)
Glucose-Capillary: 159 mg/dL — ABNORMAL HIGH (ref 70–99)

## 2012-04-17 LAB — PROTIME-INR: INR: 2.1 — ABNORMAL HIGH (ref 0.00–1.49)

## 2012-04-17 MED ORDER — WARFARIN SODIUM 4 MG PO TABS
4.0000 mg | ORAL_TABLET | Freq: Once | ORAL | Status: AC
  Administered 2012-04-17: 4 mg via ORAL
  Filled 2012-04-17: qty 1

## 2012-04-17 NOTE — Progress Notes (Signed)
ANTICOAGULATION CONSULT NOTE - Follow Up Consult  Pharmacy Consult:  Coumadin Indication: atrial fibrillation  Allergies  Allergen Reactions  . Lactose Intolerance (Gi) Nausea And Vomiting    severe stomach pain  . Penicillins Hives and Itching    "haven't used any since 1970's"  . Sulfa Antibiotics Hives and Itching    "haven't used any since 1970's"    Patient Measurements: Height: 5' (152.4 cm) Weight: 124 lb 1.9 oz (56.3 kg) IBW/kg (Calculated) : 45.5   Vital Signs: Temp: 98.9 F (37.2 C) (07/14 0500) Temp src: Oral (07/14 0500) BP: 118/65 mmHg (07/14 0500) Pulse Rate: 92  (07/14 0500)  Labs:  Basename 04/17/12 0520 04/16/12 0630 04/15/12 0525  HGB 9.3* 9.2* --  HCT 29.0* 28.6* 26.3*  PLT 396 313 224  APTT -- -- --  LABPROT 23.9* 27.6* 29.4*  INR 2.10* 2.52* 2.73*  HEPARINUNFRC -- -- --  CREATININE 0.65 -- 0.70  CKTOTAL -- -- --  CKMB -- -- --  TROPONINI -- -- --    Estimated Creatinine Clearance: 44.1 ml/min (by C-G formula based on Cr of 0.65).    Assessment: 78 YO nursing home patient with complicated medical history admitted on 7/8 with progressive weakness, lethargy, and fever. MD suspected early sepsis.   Anticoagulation: Coumadin for Afib (2.5mg  daily except 5mg  Thurs). INR today 2.1 (7/14). Declining quickly. Give 4 mg x1 dose tonight.  Infectious Disease: Abx for UTI. Urosepsis resolved. Resides at [facility].  Afebrile. WBC 12.8 today. Cefuroxime x 10 days per MD.   Harvel Quale 7/8 >>7/0 Vanc 7/9 >>7/10 Ceftriaxone 7/10>>7/13 Cefuroxime (po) 7/13 >>   7/8 urine cx - E.coli, pan-sensitive 7/8 blood cx - NGTD c diff neg  Cardiovascular: hx HLD / HTN / Afib - BP 118/65. HR 92-110. Digoxin level ok at 0.6, also on lopressor, Zocor, diltiazem, furosemide.  Endocrinology: hx T2DM with A1c high at 7.3. CBGs 79-157 last 24 hrs.  PTA glimepiride resumed. SSI.   Gastrointestinal / Nutrition: hx GERD on Protonix - dysphagia 3 diet  Neurology: hx depression /  chronic lower back pain - on olanzapine  Nephrology: SCr 0.65, CrCl ~44 ml/min, K+ 3.6  Pulmonary: 96% on 2L [place].  Hematology / Oncology: hgb 9.3, plts back up from 181 to 396 today.   Best Practices: Coumadin, PPI PO  Goal of Therapy:  INR 2-3    Plan:  - coumadin 4 mg po x 1 dose today -daily INR     Doris Cheadle, PharmD Clinical Pharmacist Pager: [phone] Phone: [phone] 04/17/2012 8:19 AM

## 2012-04-17 NOTE — Progress Notes (Signed)
Subjective: Feeling better.  No specific concerns.  Objective: Vital signs in last 24 hours: Filed Vitals:   04/16/12 2100 04/17/12 0500 04/17/12 1036 04/17/12 1038  BP: 124/55 118/65  112/57  Pulse: 99 92 103   Temp: 99.9 F (37.7 C) 98.9 F (37.2 C)    TempSrc: Oral Oral    Resp: 18 18    Height:      Weight:  56.3 kg (124 lb 1.9 oz)    SpO2: 95% 96%     Weight change: -2.668 kg (-5 lb 14.1 oz) No intake or output data in the 24 hours ending 04/17/12 1054  Physical Exam: General: Awake, No acute distress. HEENT: EOMI. Neck: Supple CV: S1 and S2 Lungs: Clear to ascultation bilaterally Abdomen: Soft, Nontender, Nondistended, +bowel sounds. Ext: Good pulses. Trace edema.  Lab Results: Basic Metabolic Panel:  Lab 04/17/12 1610 04/15/12 0525 04/14/12 0525 04/13/12 0542 04/12/12 0817  NA 135 137 135 135 139  K 3.6 4.0 4.2 3.1* 3.4*  CL 95* 102 104 105 107  CO2 27 24 18* 18* 19  GLUCOSE 108* 126* 132* 122* 100*  BUN 10 11 13 16 14   CREATININE 0.65 0.70 0.70 0.85 0.85  CALCIUM 9.0 8.9 8.7 8.3* 8.2*  MG -- -- -- -- --  PHOS -- -- -- -- --   Liver Function Tests:  Lab 04/11/12 1116  AST 20  ALT 10  ALKPHOS 68  BILITOT 0.8  PROT 6.6  ALBUMIN 3.1*   No results found for this basename: LIPASE:5,AMYLASE:5 in the last 168 hours No results found for this basename: AMMONIA:5 in the last 168 hours CBC:  Lab 04/17/12 0520 04/16/12 0630 04/15/12 0525 04/14/12 0525 04/12/12 0938 04/11/12 1116  WBC 12.8* 15.5* 13.8* 13.8* 14.2* --  NEUTROABS -- -- -- -- -- 13.9*  HGB 9.3* 9.2* 8.7* 9.5* 9.4* --  HCT 29.0* 28.6* 26.3* 29.2* 29.6* --  MCV 81.7 81.7 81.2 81.8 83.1 --  PLT 396 313 224 206 181 --   Cardiac Enzymes: No results found for this basename: CKTOTAL:5,CKMB:5,CKMBINDEX:5,TROPONINI:5 in the last 168 hours BNP (last 3 results)  Basename 04/11/12 1116 03/13/12 1603  PROBNP 4175.0* 1505.0*   CBG:  Lab 04/17/12 0733 04/16/12 2133 04/16/12 1648 04/16/12 1157  04/16/12 0725  GLUCAP 79 157* 179* 170* 132*   No results found for this basename: HGBA1C:5 in the last 72 hours Other Labs: No components found with this basename: POCBNP:3  Lab 04/11/12 1116  DDIMER <0.22   No results found for this basename: CHOL:2,HDL:2,LDLCALC:2,TRIG:2,CHOLHDL:2,LDLDIRECT:2 in the last 168 hours No results found for this basename: TSH,T4TOTAL,FREET3,T3FREE,FREET4,THYROIDAB in the last 168 hours  Lab 04/11/12 1116  VITAMINB12 --  FOLATE --  FERRITIN --  TIBC --  IRON --  RETICCTPCT 2.1    Micro Results: Recent Results (from the past 240 hour(s))  CULTURE, BLOOD (ROUTINE X 2)     Status: Normal (Preliminary result)   Collection Time   04/11/12 11:30 AM      Component Value Range Status Comment   Specimen Description BLOOD RIGHT ARM   Final    Special Requests BOTTLES DRAWN AEROBIC AND ANAEROBIC 10CC   Final    Culture  Setup Time 04/11/2012 17:29   Final    Culture     Final    Value:        BLOOD CULTURE RECEIVED NO GROWTH TO DATE CULTURE WILL BE HELD FOR 5 DAYS BEFORE ISSUING A FINAL NEGATIVE REPORT   Report Status  PENDING   Incomplete   CULTURE, BLOOD (ROUTINE X 2)     Status: Normal (Preliminary result)   Collection Time   04/11/12 11:40 AM      Component Value Range Status Comment   Specimen Description BLOOD RIGHT HAND   Final    Special Requests BOTTLES DRAWN AEROBIC ONLY 10CC   Final    Culture  Setup Time 04/11/2012 17:29   Final    Culture     Final    Value:        BLOOD CULTURE RECEIVED NO GROWTH TO DATE CULTURE WILL BE HELD FOR 5 DAYS BEFORE ISSUING A FINAL NEGATIVE REPORT   Report Status PENDING   Incomplete   URINE CULTURE     Status: Normal   Collection Time   04/11/12 11:56 AM      Component Value Range Status Comment   Specimen Description URINE, CATHETERIZED   Final    Special Requests NONE   Final    Culture  Setup Time 04/11/2012 12:25   Final    Colony Count >=100,000 COLONIES/ML   Final    Culture ESCHERICHIA COLI   Final     Report Status 04/12/2012 FINAL   Final    Organism ID, Bacteria ESCHERICHIA COLI   Final   MRSA PCR SCREENING     Status: Normal   Collection Time   04/11/12  5:40 PM      Component Value Range Status Comment   MRSA by PCR NEGATIVE  NEGATIVE Final   CLOSTRIDIUM DIFFICILE BY PCR     Status: Normal   Collection Time   04/12/12  5:20 PM      Component Value Range Status Comment   C difficile by pcr NEGATIVE  NEGATIVE Final     Studies/Results: No results found.  Medications: I have reviewed the patient's current medications. Scheduled Meds:    . atorvastatin  20 mg Oral q1800  . cefUROXime  500 mg Oral BID WC  . digoxin  0.0625 mg Oral Q M,W,F  . digoxin  0.125 mg Oral Q T,Th,S,Su  . diltiazem  30 mg Oral Q6H  . feeding supplement  237 mL Oral BID BM  . furosemide  40 mg Oral 3 times weekly  . furosemide  40 mg Oral Once  . glimepiride  4 mg Oral BID WC  . insulin aspart  0-15 Units Subcutaneous TID WC  . insulin aspart  0-5 Units Subcutaneous QHS  . latanoprost  1 drop Both Eyes QHS  . methylphenidate  5 mg Oral BID  . metoprolol tartrate  25 mg Oral BID  . multivitamin with minerals  1 tablet Oral Daily  . OLANZapine  5 mg Oral QHS  . pantoprazole  40 mg Oral Q1200  . sertraline  50 mg Oral QPM  . warfarin  2.5 mg Oral ONCE-1800  . warfarin  4 mg Oral ONCE-1800  . Warfarin - Pharmacist Dosing Inpatient   Does not apply q1800  . DISCONTD: cefTRIAXone (ROCEPHIN)  IV  1 g Intravenous Q24H  . DISCONTD: metoprolol tartrate  12.5 mg Oral BID   Continuous Infusions:  PRN Meds:.acetaminophen, ALPRAZolam, guaiFENesin-dextromethorphan, HYDROcodone-acetaminophen, loperamide, metoprolol, ondansetron (ZOFRAN) IV, ondansetron  Assessment/Plan: Acute hypoxemic respiratory failure secondary to volume overload  Patient was aggressively rehydrated when she presented and later developed respiratory symptoms related to volume overload, was given lasix IV two doses. Continue furosemide on M,  W, F. Continue supportive care.   Sepsis  Resolved. Likely source  secondary to underlying urinary tract infection. Escherichia coli in urinary tract infection is pansensitive, blood cultures show no growth to date.   E-coli UTI  Greater than 100,000 colonies pan sensitive. On ceftriaxone, antibiotic since 04/11/2012.  Transitioned to cefuroxime, treat for total of 10 days.    Atrial fibrillation with RVR  Still with very subtle tachycardia but stable if heart rate of 110. Continue oral Cardizem, and metoprolol.  Also continue digoxin.  Anticoagulated on warfarin.  Warfarin anticoagulation  INR subtherapeutic. Pharmacy managing dosing.  Dehydration  Resolved and now euvolemic.   DM II (diabetes mellitus, type II), controlled  CBGs much better controlled after adjustment in medications on 04/14/2012. Continue sliding scale insulin. Resume glimepiride.   HTN (hypertension)  Blood pressure actually somewhat low and suboptimal so we'll be cautious with utilizing any antihypertensive agents   Metabolic encephalopathy (mild)  Secondary to acute infection and high fevers-treat underlying causes. Neuro exam nonfocal.  Chronic epigastric pain/ Irritable bowel disease/Chronic diarrhea  C diff negative   Disposition  Continue care in telemetry. DC to SNF on 04/18/2012 once bed is available.    LOS: 6 days  Rindy Kollman A, MD 04/17/2012, 10:54 AM

## 2012-04-18 ENCOUNTER — Encounter (HOSPITAL_COMMUNITY): Payer: Self-pay | Admitting: Pharmacist

## 2012-04-18 LAB — GLUCOSE, CAPILLARY: Glucose-Capillary: 73 mg/dL (ref 70–99)

## 2012-04-18 MED ORDER — ENSURE COMPLETE PO LIQD: 237.0000 mL | Freq: Two times a day (BID) | ORAL | Status: DC

## 2012-04-18 MED ORDER — WARFARIN SODIUM 4 MG PO TABS
4.0000 mg | ORAL_TABLET | Freq: Once | ORAL | Status: AC
  Administered 2012-04-18: 4 mg via ORAL
  Filled 2012-04-18: qty 1

## 2012-04-18 MED ORDER — HYDROCODONE-ACETAMINOPHEN 5-325 MG PO TABS: 1.0000 | ORAL_TABLET | Freq: Four times a day (QID) | ORAL | Status: AC | PRN

## 2012-04-18 MED ORDER — ALPRAZOLAM 1 MG PO TABS: 0.5000 mg | ORAL_TABLET | Freq: Four times a day (QID) | ORAL | Status: DC | PRN

## 2012-04-18 MED ORDER — ATORVASTATIN CALCIUM 20 MG PO TABS: 20.0000 mg | ORAL_TABLET | Freq: Every day | ORAL | Status: DC

## 2012-04-18 MED ORDER — METOPROLOL TARTRATE 25 MG PO TABS: 25.0000 mg | ORAL_TABLET | Freq: Two times a day (BID) | ORAL | Status: DC

## 2012-04-18 MED ORDER — DILTIAZEM HCL ER COATED BEADS 120 MG PO CP24
120.0000 mg | ORAL_CAPSULE | Freq: Every day | ORAL | Status: DC
  Administered 2012-04-18: 120 mg via ORAL
  Filled 2012-04-18: qty 1

## 2012-04-18 MED ORDER — CEFUROXIME AXETIL 500 MG PO TABS: 500.0000 mg | ORAL_TABLET | Freq: Two times a day (BID) | ORAL | Status: AC

## 2012-04-18 MED ORDER — DILTIAZEM HCL ER COATED BEADS 120 MG PO CP24: 120.0000 mg | ORAL_CAPSULE | Freq: Every day | ORAL | Status: DC

## 2012-04-18 MED ORDER — ACETAMINOPHEN 325 MG PO TABS: 650.0000 mg | ORAL_TABLET | Freq: Four times a day (QID) | ORAL | Status: AC | PRN

## 2012-04-18 NOTE — Progress Notes (Addendum)
ANTICOAGULATION CONSULT NOTE - Follow Up Consult  Pharmacy Consult for Coumadin Indication: atrial fibrillation  Allergies  Allergen Reactions  . Lactose Intolerance (Gi) Nausea And Vomiting    severe stomach pain  . Penicillins Hives and Itching    "haven't used any since 1970's"  . Sulfa Antibiotics Hives and Itching    "haven't used any since 1970's"    Patient Measurements: Height: 5' (152.4 cm) Weight: 122 lb 9.2 oz (55.6 kg) IBW/kg (Calculated) : 45.5   Vital Signs: Temp: 98.2 F (36.8 C) (07/15 0500) BP: 105/62 mmHg (07/15 0921) Pulse Rate: 90  (07/15 0921)  Labs:  Basename 04/18/12 0500 04/17/12 0520 04/16/12 0630  HGB -- 9.3* 9.2*  HCT -- 29.0* 28.6*  PLT -- 396 313  APTT -- -- --  LABPROT 19.4* 23.9* 27.6*  INR 1.61* 2.10* 2.52*  HEPARINUNFRC -- -- --  CREATININE -- 0.65 --  CKTOTAL -- -- --  CKMB -- -- --  TROPONINI -- -- --    Estimated Creatinine Clearance: 43.8 ml/min (by C-G formula based on Cr of 0.65).   Medications:  Scheduled:    . atorvastatin  20 mg Oral q1800  . cefUROXime  500 mg Oral BID WC  . digoxin  0.0625 mg Oral Q M,W,F  . digoxin  0.125 mg Oral Q T,Th,S,Su  . diltiazem  120 mg Oral Daily  . feeding supplement  237 mL Oral BID BM  . furosemide  40 mg Oral 3 times weekly  . glimepiride  4 mg Oral BID WC  . insulin aspart  0-15 Units Subcutaneous TID WC  . insulin aspart  0-5 Units Subcutaneous QHS  . latanoprost  1 drop Both Eyes QHS  . methylphenidate  5 mg Oral BID  . metoprolol tartrate  25 mg Oral BID  . multivitamin with minerals  1 tablet Oral Daily  . OLANZapine  5 mg Oral QHS  . pantoprazole  40 mg Oral Q1200  . sertraline  50 mg Oral QPM  . warfarin  4 mg Oral ONCE-1800  . warfarin  4 mg Oral Once  . Warfarin - Pharmacist Dosing Inpatient   Does not apply q1800  . DISCONTD: diltiazem  30 mg Oral Q6H   Infusions:    Assessment: 78 YO nursing home patient with complicated medical history admitted on 7/8 with  progressive weakness, lethargy, and fever. MD suspected early sepsis. Pt to be d/c'ed to rehab facility today.   INR SUB-therapeutic at 1.61, expect increase tomorrow. INR to be drawn at facility on 04/20/12. Pt educated about importance of consistent diet.  Will give a dose of Coumadin before discharge to get back into therapeutic range and then restart home regimen tomorrow. CBC and platelets stable, no new labs today. No bleeding noted.   Goal of Therapy:  INR 2-3 Monitor platelets by anticoagulation protocol: Yes   Plan:  1. Give Coumadin 4mg  PO x 1 now before discharge 2. Restart home regimen of Coumadin 2.5mg  PO daily except 5mg  PO on thursdays 3. INR check at rehab facility on 04/20/12  Laurence Slate 04/18/2012,11:22 AM

## 2012-04-18 NOTE — Progress Notes (Signed)
D/c orders received;IV removed with gauze on, pt remains in stable condition, pt meds and instructions reviewed and given to pt; pt d/c to SNF, taken via ambulance

## 2012-04-18 NOTE — Progress Notes (Signed)
.  Clinical social worker assisted with patient discharge to skilled nursing facility, [facility]. .Patient transportation provided by [employer] [facility] with patient chart copy. .No further Clinical Social Work needs, signing off.   Catha Gosselin, LCSWA  [phone] temporal arteritis.d 1457pm

## 2012-04-18 NOTE — Progress Notes (Signed)
Per chart review, pt anticipated discharge for today to [facility]. .Clinical social worker continuing to follow pt to assist with pt dc plans and further csw needs.   Catha Gosselin, Theresia Majors  [phone] .04/18/2012 9:09am

## 2012-04-18 NOTE — Progress Notes (Signed)
Subjective: No specific concerns.  Husband by bedside.  Objective: Vital signs in last 24 hours: Filed Vitals:   04/17/12 1400 04/17/12 1900 04/18/12 0500 04/18/12 0921  BP: 122/67 112/56 103/60 105/62  Pulse: 84 88 108 90  Temp: 98.5 F (36.9 C) 100 F (37.8 C) 98.2 F (36.8 C)   TempSrc:      Resp: 20 18 20    Height:      Weight:   55.6 kg (122 lb 9.2 oz)   SpO2: 97% 95% 93% 96%   Weight change: -0.7 kg (-1 lb 8.7 oz)  Intake/Output Summary (Last 24 hours) at 04/18/12 1034 Last data filed at 04/17/12 2120  Gross per 24 hour  Intake    120 ml  Output      1 ml  Net    119 ml    Physical Exam: General: Awake, No acute distress. HEENT: EOMI. Neck: Supple CV: S1 and S2 Lungs: Clear to ascultation bilaterally Abdomen: Soft, Nontender, Nondistended, +bowel sounds. Ext: Good pulses. Trace edema.  Lab Results: Basic Metabolic Panel:  Lab 04/17/12 3664 04/15/12 0525 04/14/12 0525 04/13/12 0542 04/12/12 0817  NA 135 137 135 135 139  K 3.6 4.0 4.2 3.1* 3.4*  CL 95* 102 104 105 107  CO2 27 24 18* 18* 19  GLUCOSE 108* 126* 132* 122* 100*  BUN 10 11 13 16 14   CREATININE 0.65 0.70 0.70 0.85 0.85  CALCIUM 9.0 8.9 8.7 8.3* 8.2*  MG -- -- -- -- --  PHOS -- -- -- -- --   Liver Function Tests:  Lab 04/11/12 1116  AST 20  ALT 10  ALKPHOS 68  BILITOT 0.8  PROT 6.6  ALBUMIN 3.1*   No results found for this basename: LIPASE:5,AMYLASE:5 in the last 168 hours No results found for this basename: AMMONIA:5 in the last 168 hours CBC:  Lab 04/17/12 0520 04/16/12 0630 04/15/12 0525 04/14/12 0525 04/12/12 0938 04/11/12 1116  WBC 12.8* 15.5* 13.8* 13.8* 14.2* --  NEUTROABS -- -- -- -- -- 13.9*  HGB 9.3* 9.2* 8.7* 9.5* 9.4* --  HCT 29.0* 28.6* 26.3* 29.2* 29.6* --  MCV 81.7 81.7 81.2 81.8 83.1 --  PLT 396 313 224 206 181 --   Cardiac Enzymes: No results found for this basename: CKTOTAL:5,CKMB:5,CKMBINDEX:5,TROPONINI:5 in the last 168 hours BNP (last 3 results)  Basename  04/11/12 1116 03/13/12 1603  PROBNP 4175.0* 1505.0*   CBG:  Lab 04/18/12 0735 04/17/12 2038 04/17/12 1648 04/17/12 1155 04/17/12 0733  GLUCAP 73 159* 161* 158* 79   No results found for this basename: HGBA1C:5 in the last 72 hours Other Labs: No components found with this basename: POCBNP:3  Lab 04/11/12 1116  DDIMER <0.22   No results found for this basename: CHOL:2,HDL:2,LDLCALC:2,TRIG:2,CHOLHDL:2,LDLDIRECT:2 in the last 168 hours No results found for this basename: TSH,T4TOTAL,FREET3,T3FREE,FREET4,THYROIDAB in the last 168 hours  Lab 04/11/12 1116  VITAMINB12 --  FOLATE --  FERRITIN --  TIBC --  IRON --  RETICCTPCT 2.1    Micro Results: Recent Results (from the past 240 hour(s))  CULTURE, BLOOD (ROUTINE X 2)     Status: Normal   Collection Time   04/11/12 11:30 AM      Component Value Range Status Comment   Specimen Description BLOOD RIGHT ARM   Final    Special Requests BOTTLES DRAWN AEROBIC AND ANAEROBIC 10CC   Final    Culture  Setup Time 04/11/2012 17:29   Final    Culture NO GROWTH 5 DAYS  Final    Report Status 04/17/2012 FINAL   Final   CULTURE, BLOOD (ROUTINE X 2)     Status: Normal   Collection Time   04/11/12 11:40 AM      Component Value Range Status Comment   Specimen Description BLOOD RIGHT HAND   Final    Special Requests BOTTLES DRAWN AEROBIC ONLY 10CC   Final    Culture  Setup Time 04/11/2012 17:29   Final    Culture NO GROWTH 5 DAYS   Final    Report Status 04/17/2012 FINAL   Final   URINE CULTURE     Status: Normal   Collection Time   04/11/12 11:56 AM      Component Value Range Status Comment   Specimen Description URINE, CATHETERIZED   Final    Special Requests NONE   Final    Culture  Setup Time 04/11/2012 12:25   Final    Colony Count >=100,000 COLONIES/ML   Final    Culture ESCHERICHIA COLI   Final    Report Status 04/12/2012 FINAL   Final    Organism ID, Bacteria ESCHERICHIA COLI   Final   MRSA PCR SCREENING     Status: Normal    Collection Time   04/11/12  5:40 PM      Component Value Range Status Comment   MRSA by PCR NEGATIVE  NEGATIVE Final   CLOSTRIDIUM DIFFICILE BY PCR     Status: Normal   Collection Time   04/12/12  5:20 PM      Component Value Range Status Comment   C difficile by pcr NEGATIVE  NEGATIVE Final     Studies/Results: No results found.  Medications: I have reviewed the patient's current medications. Scheduled Meds:    . atorvastatin  20 mg Oral q1800  . cefUROXime  500 mg Oral BID WC  . digoxin  0.0625 mg Oral Q M,W,F  . digoxin  0.125 mg Oral Q T,Th,S,Su  . diltiazem  30 mg Oral Q6H  . feeding supplement  237 mL Oral BID BM  . furosemide  40 mg Oral 3 times weekly  . glimepiride  4 mg Oral BID WC  . insulin aspart  0-15 Units Subcutaneous TID WC  . insulin aspart  0-5 Units Subcutaneous QHS  . latanoprost  1 drop Both Eyes QHS  . methylphenidate  5 mg Oral BID  . metoprolol tartrate  25 mg Oral BID  . multivitamin with minerals  1 tablet Oral Daily  . OLANZapine  5 mg Oral QHS  . pantoprazole  40 mg Oral Q1200  . sertraline  50 mg Oral QPM  . warfarin  4 mg Oral ONCE-1800  . Warfarin - Pharmacist Dosing Inpatient   Does not apply q1800   Continuous Infusions:  PRN Meds:.acetaminophen, ALPRAZolam, guaiFENesin-dextromethorphan, HYDROcodone-acetaminophen, loperamide, metoprolol, ondansetron (ZOFRAN) IV, ondansetron  Assessment/Plan: Acute hypoxemic respiratory failure secondary to volume overload  Patient was aggressively rehydrated when she presented and later developed respiratory symptoms related to volume overload, was given lasix IV two doses, respiratory status much improved and is back to baseline. Continue furosemide on M, W, F. Continue supportive care.   Sepsis  Resolved. Likely source secondary to underlying urinary tract infection. Escherichia coli in urinary tract infection is pansensitive, blood cultures show no growth to date.    E-coli UTI  Greater than 100,000  colonies pan sensitive. On ceftriaxone, antibiotic since 04/11/2012.  Transitioned to cefuroxime, treat for total of 10 days.  Treat for  3 more days to complete a course.  Atrial fibrillation with RVR  Still with very subtle tachycardia but stable if heart rate of 110. Continue oral Cardizem, and metoprolol.  Also continue digoxin.  Anticoagulated on warfarin.  Warfarin anticoagulation  Pharmacy managing dosing.  To have PT/INR checked on 04/20/2012.  Dehydration  Resolved and now euvolemic.   DM II (diabetes mellitus, type II), controlled  CBGs much better controlled after adjustment in medications on 04/14/2012. Continue sliding scale insulin. Resume glimepiride.  Further management of diabetes of outpatient.  HTN (hypertension)  Stable, continue furosemide, metoprolol, and diltiazem.  Transition diltiazem from every 6 hours to extended release once a day dose prior to discharge.   Metabolic encephalopathy (mild)  Secondary to acute infection and high fevers-treat underlying causes. Neuro exam nonfocal.  Chronic epigastric pain/ Irritable bowel disease/Chronic diarrhea  C diff negative   Disposition  DC to SNF today.    LOS: 7 days  [name] [name] A, MD 04/18/2012, 10:34 AM

## 2012-04-18 NOTE — Discharge Summary (Signed)
Physician Discharge Summary  [name] [name] ZOX:[id number] DOB: [birth year] DOA: 04/11/2012  PCP: Cecile Hearing, MD, per husband currently in the process of changing primary care physician to Dr. Wylene Simmer with [facility].  Admit date: 04/11/2012 Discharge date: 04/18/2012  Recommendations for Outpatient Follow-up:  1. Follow up with your primary care physician in 1 week. 2. PT/INR to be checked on 04/20/2012.  To have CBC and electrolytes checked within one week.  Discharge Diagnoses:  Principal Problem:  *SIRS (systemic inflammatory response syndrome) Active Problems:  E-coli UTI  DM II (diabetes mellitus, type II), controlled  HTN (hypertension)  Fever  Leukocytosis  Chronic epigastric pain  Atrial fibrillation with RVR  Irritable bowel disease  Chronic diarrhea  Dehydration  Metabolic encephalopathy  Acute respiratory failure with hypoxia secondary to volume overload   Discharge Condition: Stable  Diet recommendation: Diabetic diet  History of present illness:  On admission "Dailey Alberson is a 78 y.o. female, with history of hypertension, atrial fibrillation, COPD, chronic back pain, recent pancreatitis, who recently moved to her house from a skilled nursing facility is brought in by her husband as for the last 3-4 days she has showed gradually progressive weakness and lethargy, last night she started to run fevers, she denied any chest pain cough or abdominal pain, she was brought in the ER where she was found to be in early sepsis secondary to UTI, atrial fibrillation with RVR, dry oral mucosa and evidence of dehydration."  Hospital Course:  Sepsis/SIRS  Resolved. Likely source secondary to underlying urinary tract infection. Escherichia coli in urinary tract infection is pansensitive, blood cultures show no growth to date.    E-coli UTI  Greater than 100,000 colonies pan sensitive. On ceftriaxone, antibiotic since 04/11/2012.  Transitioned to cefuroxime, treat for total of  10 days.  Treat for 3 more days to complete a course.  Acute hypoxemic respiratory failure secondary to volume overload  Patient was aggressively rehydrated when she presented and later developed respiratory symptoms related to volume overload, was given lasix IV two doses, respiratory status much improved and is back to baseline. Continue furosemide on M, W, F. Continue supportive care.  Initially the patient was in step down, she was transitioned out of step down on 04/15/2012.  Atrial fibrillation with RVR  Still with very subtle tachycardia but stable if heart rate of 110. Continue oral Cardizem, and metoprolol.  Also continue digoxin.  Anticoagulated on warfarin.  Warfarin anticoagulation  Pharmacy managing dosing.  To have PT/INR checked on 04/20/2012.  Dehydration  Resolved and now euvolemic.   DM II (diabetes mellitus, type II) CBGs stable.  Sliding scale insulin while hospitalized.  We started the patient on glimepiride.  Further management of diabetes of outpatient.  HTN (hypertension)  Stable, continue furosemide, metoprolol, and diltiazem.  Transition diltiazem from every 6 hours to extended release once a day dose prior to discharge.   Metabolic encephalopathy (mild)  Secondary to acute infection and high fevers-treat underlying causes. Neuro exam nonfocal.  Chronic epigastric pain/ Irritable bowel disease/Chronic diarrhea  C diff negative   Consultations:  Pharmacy to dose Coumadin  Discharge Exam: Filed Vitals:   04/18/12 0921  BP: 105/62  Pulse: 90  Temp:   Resp:    Filed Vitals:   04/17/12 1400 04/17/12 1900 04/18/12 0500 04/18/12 0921  BP: 122/67 112/56 103/60 105/62  Pulse: 84 88 108 90  Temp: 98.5 F (36.9 C) 100 F (37.8 C) 98.2 F (36.8 C)   TempSrc:  Resp: 20 18 20    Height:      Weight:   55.6 kg (122 lb 9.2 oz)   SpO2: 97% 95% 93% 96%    Discharge Instructions  Discharge Orders    Future Appointments: Provider: Department: Dept  Phone: Center:   05/18/2012 12:00 PM Rollene Rotunda, MD [facility] [phone] [id number]     Future Orders Please Complete By Expires   Diet Carb Modified      Increase activity slowly      Discharge instructions      Comments:   Followup with your PCP in 1 week. Please have PT/INR checked in 04/20/2012. Please have CBC and BMET checked within 1 week.     Medication List  As of 04/18/2012 11:20 AM   STOP taking these medications         simvastatin 40 MG tablet         TAKE these medications         acetaminophen 325 MG tablet   Commonly known as: TYLENOL   Take 2 tablets (650 mg total) by mouth every 6 (six) hours as needed for fever.      ALPRAZolam 1 MG tablet   Commonly known as: XANAX   Take 0.5 tablets (0.5 mg total) by mouth 4 (four) times daily as needed for anxiety. For sleep or anxiety.      atorvastatin 20 MG tablet   Commonly known as: LIPITOR   Take 1 tablet (20 mg total) by mouth daily at 6 PM.      cefUROXime 500 MG tablet   Commonly known as: CEFTIN   Take 1 tablet (500 mg total) by mouth 2 (two) times daily with a meal.      digoxin 0.125 MG tablet   Commonly known as: LANOXIN   Take 62.5-125 mcg by mouth daily. Take on Tuesdays, Thursdays, Saturdays, and Sundays.  Take 62. on all other days.      diltiazem 120 MG 24 hr capsule   Commonly known as: CARDIZEM CD   Take 1 capsule (120 mg total) by mouth daily.      feeding supplement Liqd   Take 237 mLs by mouth 2 (two) times daily between meals.      furosemide 40 MG tablet   Commonly known as: LASIX   Take 40 mg by mouth 3 (three) times a week. Take 1 tablet on Mondays, Wednesdays, and Saturdays.      glimepiride 4 MG tablet   Commonly known as: AMARYL   Take 4 mg by mouth 2 (two) times daily.      HYDROcodone-acetaminophen 5-325 MG per tablet   Commonly known as: NORCO   Take 1 tablet by mouth every 6 (six) hours as needed for pain.      latanoprost 0.005 % ophthalmic  solution   Commonly known as: XALATAN   Place 1 drop into both eyes at bedtime.      methylphenidate 5 MG tablet   Commonly known as: RITALIN   Take 5 mg by mouth 2 (two) times daily.      metoprolol tartrate 25 MG tablet   Commonly known as: LOPRESSOR   Take 1 tablet (25 mg total) by mouth 2 (two) times daily.      multivitamin with minerals Tabs   Take 1 tablet by mouth daily.      OLANZapine 5 MG tablet   Commonly known as: ZYPREXA   Take 5 mg by mouth at bedtime.  omeprazole 20 MG capsule   Commonly known as: PRILOSEC   Take 20 mg by mouth 2 (two) times daily.      sertraline 50 MG tablet   Commonly known as: ZOLOFT   Take 50 mg by mouth every evening.      warfarin 5 MG tablet   Commonly known as: COUMADIN   Take 2.5-5 mg by mouth daily. Take 2.5 mg each day except take 5mg  on Thursdays           Follow-up Information    Follow up with Cecile Hearing, MD. (PCP in 1 week.)    Contact information:   [address] Kathryne Sharper [facility] [address] [phone]          The results of significant diagnostics from this hospitalization (including imaging, microbiology, ancillary and laboratory) are listed below for reference.    Significant Diagnostic Studies: Dg Chest 2 View  04/11/2012  *RADIOLOGY REPORT*  Clinical Data: Fever, shortness of breath, weakness  CHEST - 2 VIEW  Comparison: 01/29/2012  Findings: Decreased lung volumes. Minimal enlargement of cardiac silhouette. Mitral annular calcification noted. Mildly tortuous thoracic aorta with atherosclerotic calcification. Chronic accentuation of interstitial markings little changed. No acute infiltrate, pleural effusion or pneumothorax. Bones appear diffusely demineralized.  IMPRESSION: Chronic interstitial lung disease changes. No definite acute abnormalities. Minimal enlargement of cardiac silhouette.  Original Report Authenticated By: Lollie Marrow, M.D.   Ct Head Wo Contrast  04/11/2012   *RADIOLOGY REPORT*  Clinical Data: Fever.  Slurred speech.  CT HEAD WITHOUT CONTRAST  Technique:  Contiguous axial images were obtained from the base of the skull through the vertex without contrast.  Comparison: Head CT scan 11/29/2011.  Findings: Chronic microvascular ischemic change and atrophy are again seen.  No evidence of acute abnormality including infarction, hemorrhage, mass lesion, mass effect, midline shift or abnormal extra-axial fluid collection.  No hydrocephalus or pneumocephalus. Calvarium intact.  IMPRESSION: No acute finding.  Original Report Authenticated By: Bernadene Bell. Maricela Curet, M.D.    Microbiology: Recent Results (from the past 240 hour(s))  CULTURE, BLOOD (ROUTINE X 2)     Status: Normal   Collection Time   04/11/12 11:30 AM      Component Value Range Status Comment   Specimen Description BLOOD RIGHT ARM   Final    Special Requests BOTTLES DRAWN AEROBIC AND ANAEROBIC 10CC   Final    Culture  Setup Time 04/11/2012 17:29   Final    Culture NO GROWTH 5 DAYS   Final    Report Status 04/17/2012 FINAL   Final   CULTURE, BLOOD (ROUTINE X 2)     Status: Normal   Collection Time   04/11/12 11:40 AM      Component Value Range Status Comment   Specimen Description BLOOD RIGHT HAND   Final    Special Requests BOTTLES DRAWN AEROBIC ONLY 10CC   Final    Culture  Setup Time 04/11/2012 17:29   Final    Culture NO GROWTH 5 DAYS   Final    Report Status 04/17/2012 FINAL   Final   URINE CULTURE     Status: Normal   Collection Time   04/11/12 11:56 AM      Component Value Range Status Comment   Specimen Description URINE, CATHETERIZED   Final    Special Requests NONE   Final    Culture  Setup Time 04/11/2012 12:25   Final    Colony Count >=100,000 COLONIES/ML   Final  Culture ESCHERICHIA COLI   Final    Report Status 04/12/2012 FINAL   Final    Organism ID, Bacteria ESCHERICHIA COLI   Final   MRSA PCR SCREENING     Status: Normal   Collection Time   04/11/12  5:40 PM      Component  Value Range Status Comment   MRSA by PCR NEGATIVE  NEGATIVE Final   CLOSTRIDIUM DIFFICILE BY PCR     Status: Normal   Collection Time   04/12/12  5:20 PM      Component Value Range Status Comment   C difficile by pcr NEGATIVE  NEGATIVE Final      Labs: Basic Metabolic Panel:  Lab 04/17/12 1191 04/15/12 0525 04/14/12 0525 04/13/12 0542 04/12/12 0817  NA 135 137 135 135 139  K 3.6 4.0 4.2 3.1* 3.4*  CL 95* 102 104 105 107  CO2 27 24 18* 18* 19  GLUCOSE 108* 126* 132* 122* 100*  BUN 10 11 13 16 14   CREATININE 0.65 0.70 0.70 0.85 0.85  CALCIUM 9.0 8.9 8.7 8.3* 8.2*  MG -- -- -- -- --  PHOS -- -- -- -- --   Liver Function Tests:  Lab 04/11/12 1116  AST 20  ALT 10  ALKPHOS 68  BILITOT 0.8  PROT 6.6  ALBUMIN 3.1*   No results found for this basename: LIPASE:5,AMYLASE:5 in the last 168 hours No results found for this basename: AMMONIA:5 in the last 168 hours CBC:  Lab 04/17/12 0520 04/16/12 0630 04/15/12 0525 04/14/12 0525 04/12/12 0938 04/11/12 1116  WBC 12.8* 15.5* 13.8* 13.8* 14.2* --  NEUTROABS -- -- -- -- -- 13.9*  HGB 9.3* 9.2* 8.7* 9.5* 9.4* --  HCT 29.0* 28.6* 26.3* 29.2* 29.6* --  MCV 81.7 81.7 81.2 81.8 83.1 --  PLT 396 313 224 206 181 --   Cardiac Enzymes: No results found for this basename: CKTOTAL:5,CKMB:5,CKMBINDEX:5,TROPONINI:5 in the last 168 hours BNP: BNP (last 3 results)  Basename 04/11/12 1116 03/13/12 1603  PROBNP 4175.0* 1505.0*   CBG:  Lab 04/18/12 0735 04/17/12 2038 04/17/12 1648 04/17/12 1155 04/17/12 0733  GLUCAP 73 159* 161* 158* 79    Time coordinating discharge: 40  Signed:  Alyra Patty A  [facility] 04/18/2012, 11:08 AM

## 2012-04-18 NOTE — Progress Notes (Signed)
Physical Therapy Treatment Patient Details Name: [name] [name] MRN: [id number] DOB: 04/18/1930 Today's Date: 04/18/2012 Time: 4696-2952 PT Time Calculation (min): 20 min  PT Assessment / Plan / Recommendation Comments on Treatment Session   Patient with improved functional mobility.    Follow Up Recommendations  Skilled nursing facility    Barriers to Discharge  None      Equipment Recommendations  Defer to next venue    Recommendations for Other Services  None  Frequency Min 3X/week   Plan Discharge plan remains appropriate;Frequency remains appropriate    Precautions / Restrictions Precautions Precautions: Fall Restrictions Weight Bearing Restrictions: No   Pertinent Vitals/Pain VSS/ No pain    Mobility  Bed Mobility Details for Bed Mobility Assistance:  N/T Up on edge of bed.  Transfers Sit to Stand: 4: Min assist;With upper extremity assist;From bed Stand to Sit: 4: Min guard;With upper extremity assist;To bed Details for Transfer Assistance: Verbal cues for hand placement to device. Difficulty standing without upper extremity use.  Ambulation/Gait Ambulation/Gait Assistance: 4: Min assist Ambulation Distance (Feet): 30 Feet (x 2) Ambulation/Gait Assistance Details: Verbal cues for posture. Intermittent advancement of walker by PT to improve step continuity. Gait Pattern: Step-through pattern;Decreased stride length;Trunk flexed     PT Goals Acute Rehab PT Goals Pt will go Sit to Stand: with supervision;with upper extremity assist PT Goal: Sit to Stand - Progress: Updated due to goal met Pt will go Stand to Sit: with supervision;with upper extremity assist PT Goal: Stand to Sit - Progress: Updated due to goals met PT Goal: Ambulate - Progress: Progressing toward goal  Visit Information  Last PT Received On: 04/18/12 Assistance Needed: +1    Subjective Data  Subjective: Patient reports that she knows that her rehab is now the most important  thing Patient Stated Goal: Home once increased strength   Cognition  Overall Cognitive Status: Appears within functional limits for tasks assessed/performed Arousal/Alertness: Awake/alert Orientation Level: Appears intact for tasks assessed Behavior During Session: [facility] for tasks performed    Balance  High Level Balance High Level Balance Activites: Turns;Backward walking High Level Balance Comments: with walker and minimal assistance  End of Session PT - End of Session Equipment Utilized During Treatment: Gait belt Activity Tolerance: Patient tolerated treatment well Patient left: in bed;with call bell/phone within reach;with bed alarm set Nurse Communication: Mobility status    Edwyna Perfect, PT  Pager [phone]  04/18/2012, 10:48 AM

## 2012-05-04 ENCOUNTER — Telehealth: Payer: Self-pay | Admitting: *Deleted

## 2012-05-04 NOTE — Telephone Encounter (Signed)
[name] AWARE OF MONITOR RESULTS PER DR HOCHREIN  NSR WITH PVC'S  OCCASSIONAL SHORT RUNS OF ATRIAL TACH   SHE WAS NOT IN ATRIAL  FIB  FOR WHICH WAS EVIDENT ON 04-11-12 EKG ./CY

## 2012-05-09 ENCOUNTER — Telehealth: Payer: Self-pay | Admitting: Cardiology

## 2012-05-09 NOTE — Telephone Encounter (Signed)
[name] calling stating feet swollen, bilaterally--no c/o SOB,no CP,--pt states is not pitting at this time--advised to take furosemide 40mg  now and take 20mg  this pm--keep feet elevated--pt has coumadin appoint 8/6, and i advised pt to have nurse check edema when she comes for coumadin appoint--husband agrees

## 2012-05-09 NOTE — Telephone Encounter (Signed)
Pt having swelling in feet over the weekend, worse today, has appt 8-14, what to do??

## 2012-05-10 ENCOUNTER — Ambulatory Visit (INDEPENDENT_AMBULATORY_CARE_PROVIDER_SITE_OTHER): Payer: Medicare Other

## 2012-05-10 ENCOUNTER — Telehealth: Payer: Self-pay

## 2012-05-10 DIAGNOSIS — R002 Palpitations: Secondary | ICD-10-CM

## 2012-05-10 DIAGNOSIS — Z7901 Long term (current) use of anticoagulants: Secondary | ICD-10-CM

## 2012-05-10 DIAGNOSIS — I4891 Unspecified atrial fibrillation: Secondary | ICD-10-CM

## 2012-05-10 LAB — POCT INR: INR: 1.8

## 2012-05-10 NOTE — Telephone Encounter (Signed)
[name] called into office yesterday 05/09/12 reporting pt was having increased B LE edema.  Pt was instructed to take additional Lasix yesterday 40mg  in the am and 20mg  in the pm.  Pt reports increased urinary output yesterday evening, but feet and ankles B remain swollen and are pitting.  Triage RN yesterday advised pt's husband to have Coumadin Clinic RN to assess edema.  Reporting findings, pt denies increased SOB or CP.  Pt has appt to see Dr Antoine Poche in office next week 05/18/12.  Please call and advise.  Thanks.

## 2012-05-12 NOTE — Telephone Encounter (Signed)
[name] calling re pt's edema not any better, pls advise

## 2012-05-12 NOTE — Telephone Encounter (Signed)
Spoke with Husband who staets edema is actually worse looking to him and he doesn't feel as though the increased Lasix has helped at all.  She has not been weighing daily and I requested she start doing this every day at the start time.  She states understanding.  Husband reports a home health nurse is coming out tomorrow to check her out. Requested hhnurse call after assessing her.

## 2012-05-18 ENCOUNTER — Ambulatory Visit (INDEPENDENT_AMBULATORY_CARE_PROVIDER_SITE_OTHER): Payer: Medicare Other | Admitting: Cardiology

## 2012-05-18 ENCOUNTER — Ambulatory Visit (INDEPENDENT_AMBULATORY_CARE_PROVIDER_SITE_OTHER): Payer: Medicare Other | Admitting: *Deleted

## 2012-05-18 ENCOUNTER — Encounter: Payer: Self-pay | Admitting: Cardiology

## 2012-05-18 VITALS — BP 122/70 | HR 70 | Ht 60.0 in | Wt 129.4 lb

## 2012-05-18 DIAGNOSIS — I4891 Unspecified atrial fibrillation: Secondary | ICD-10-CM

## 2012-05-18 DIAGNOSIS — R002 Palpitations: Secondary | ICD-10-CM

## 2012-05-18 DIAGNOSIS — Z7901 Long term (current) use of anticoagulants: Secondary | ICD-10-CM

## 2012-05-18 DIAGNOSIS — I10 Essential (primary) hypertension: Secondary | ICD-10-CM

## 2012-05-18 NOTE — Patient Instructions (Addendum)
Your physician recommends that you return for lab work in: today (bmet, tsh)  You need to get compression stockings  Your physician recommends that you schedule a follow-up appointment in: 4 months.

## 2012-05-18 NOTE — Progress Notes (Signed)
HPI The patient presents for evaluation of atrial fibrillation and lower extremity edema. Since I last saw her she was admitted with a septic syndrome and urinary tract infection. She has been back in rehabilitation following this hospitalization. We have been talking with her by phone if she has had continued problems with lower extremity edema. She does admit to using salt and eating pretzels. She tries to keep her feet elevated in a hospital bed. However, she's having continued swelling despite increased doses of diuretic. She is not currently taking this daily however. She denies any PND or orthopnea. She denies any chest pressure, neck or arm discomfort. She has had some increased weakness and somnolence. She's taking Xanax 4 times daily but says she's been doing this previously.   Allergies  Allergen Reactions  . Lactose Intolerance (Gi) Nausea And Vomiting    severe stomach pain  . Penicillins Hives and Itching    "haven't used any since 1970's"  . Sulfa Antibiotics Hives and Itching    "haven't used any since 1970's"    Current Outpatient Prescriptions  Medication Sig Dispense Refill  . acetaminophen (TYLENOL) 325 MG tablet Take 2 tablets (650 mg total) by mouth every 6 (six) hours as needed for fever.      . ALPRAZolam (XANAX) 1 MG tablet Take 0.5 tablets (0.5 mg total) by mouth 4 (four) times daily as needed for anxiety. For sleep or anxiety.  8 tablet  0  . atorvastatin (LIPITOR) 20 MG tablet Take 1 tablet (20 mg total) by mouth daily at 6 PM.      . digoxin (LANOXIN) 0.125 MG tablet Take 62.5-125 mcg by mouth daily. Take on Tuesdays, Thursdays, Saturdays, and Sundays.  Take 62. on all other days.      Marland Kitchen diltiazem (CARDIZEM CD) 120 MG 24 hr capsule Take 1 capsule (120 mg total) by mouth daily.      . feeding supplement (ENSURE COMPLETE) LIQD Take 237 mLs by mouth 2 (two) times daily between meals.      . furosemide (LASIX) 40 MG tablet Take 40 mg by mouth 3 (three)  times a week. Take 1 tablet on Mondays, Wednesdays, and Saturdays.      Marland Kitchen glimepiride (AMARYL) 4 MG tablet Take 4 mg by mouth 2 (two) times daily.      Marland Kitchen latanoprost (XALATAN) 0.005 % ophthalmic solution Place 1 drop into both eyes at bedtime.      . methylphenidate (RITALIN) 5 MG tablet Take 5 mg by mouth 2 (two) times daily.      . metoprolol tartrate (LOPRESSOR) 25 MG tablet Take 1 tablet (25 mg total) by mouth 2 (two) times daily.      . Multiple Vitamin (MULTIVITAMIN WITH MINERALS) TABS Take 1 tablet by mouth daily.      Marland Kitchen OLANZapine (ZYPREXA) 5 MG tablet Take 5 mg by mouth at bedtime.      Marland Kitchen omeprazole (PRILOSEC) 20 MG capsule Take 20 mg by mouth 2 (two) times daily.       . sertraline (ZOLOFT) 50 MG tablet Take 50 mg by mouth every evening.      . warfarin (COUMADIN) 5 MG tablet Take 2.5-5 mg by mouth daily. Take 2.5 mg each day except take 5mg  on Thursdays        Past Medical History  Diagnosis Date  . Pancreatitis     ~2008 and again in 11/2011 s/p ERCP  . Hypertension   . Glaucoma   . High  cholesterol   . GERD (gastroesophageal reflux disease)   . H/O hiatal hernia   . Atrial fibrillation   . Dizziness - light-headed   . Pneumonia 12/2011    "first time I know about"  . Shortness of breath on exertion   . Type II diabetes mellitus   . Depression   . Chronic lower back pain     Past Surgical History  Procedure Date  . Ercp 11/13/2011    Procedure: ENDOSCOPIC RETROGRADE CHOLANGIOPANCREATOGRAPHY (ERCP);  Surgeon: Theda Belfast, MD;  Location: [facility] ENDOSCOPY;  Service: Endoscopy;  Laterality: N/A;  . Tonsillectomy and adenoidectomy ~ 1939  . Cholecystectomy 1990's  . Appendectomy ~ 1960  . Vaginal hysterectomy ~ 1960's  . Dilation and curettage of uterus   . Cataract extraction w/ intraocular lens  implant, bilateral 2000's    ROS:  As stated in the HPI and negative for all other systems.   PHYSICAL EXAM BP 122/70  Pulse 70  Ht 5' (1.524 m)  Wt 129 lb 6.4 oz (58.695  kg)  BMI 25.27 kg/m2 PHYSICAL EXAM GEN:  Frail  appearing and somnolence NECK:  No jugular venous distention at 90 degrees, waveform within normal limits, carotid upstroke brisk and symmetric, no bruits, no thyromegaly LYMPHATICS:  No cervical adenopathy LUNGS:  Clear to auscultation bilaterally BACK:  No CVA tenderness CHEST:  Unremarkable HEART:  S1 and S2 within normal limits, no S3, no clicks, no rubs, no murmurs ABD:  Positive bowel sounds normal in frequency in pitch, no bruits, no rebound, no guarding, unable to assess midline mass or bruit with the patient seated. EXT:  2 plus pulses throughout, moderate edema, no cyanosis no clubbing SKIN:  No rashes no nodules NEURO:  Cranial nerves II through XII grossly intact, motor grossly intact throughout PSYCH:  Cognitively intact, oriented to person place and time  ASSESSMENT AND PLAN   Campath-induced atrial fibrillation - The patient  tolerates this rhythm and rate control and anticoagulation. We will continue with the meds as listed.  HTN (hypertension) -  The blood pressure is at target. No change in medications is indicated. We will continue with therapeutic lifestyle changes (TLC).   Mitral regurgitation -  I will follow this up with echocardiography.  Edema -  I would prefer conservative therapy. She'll stop eating salty foods. I've written for compression stockings. Further management will be based on this.   Somnolence  - This may be related to her Xanax and she is going to discuss this with her psychiatrist. Maryclare Labrador check a TSH and CMET.

## 2012-05-20 ENCOUNTER — Other Ambulatory Visit: Payer: Medicare Other

## 2012-05-20 DIAGNOSIS — K529 Noninfective gastroenteritis and colitis, unspecified: Secondary | ICD-10-CM | POA: Insufficient documentation

## 2012-05-20 DIAGNOSIS — M545 Low back pain, unspecified: Secondary | ICD-10-CM | POA: Insufficient documentation

## 2012-05-20 DIAGNOSIS — K219 Gastro-esophageal reflux disease without esophagitis: Secondary | ICD-10-CM | POA: Insufficient documentation

## 2012-05-25 ENCOUNTER — Encounter: Payer: Medicare Other | Admitting: *Deleted

## 2012-05-25 ENCOUNTER — Other Ambulatory Visit (INDEPENDENT_AMBULATORY_CARE_PROVIDER_SITE_OTHER): Payer: Medicare Other

## 2012-05-25 ENCOUNTER — Ambulatory Visit (INDEPENDENT_AMBULATORY_CARE_PROVIDER_SITE_OTHER): Payer: Medicare Other | Admitting: *Deleted

## 2012-05-25 DIAGNOSIS — I10 Essential (primary) hypertension: Secondary | ICD-10-CM

## 2012-05-25 DIAGNOSIS — I4891 Unspecified atrial fibrillation: Secondary | ICD-10-CM

## 2012-05-25 DIAGNOSIS — E7800 Pure hypercholesterolemia, unspecified: Secondary | ICD-10-CM

## 2012-05-25 DIAGNOSIS — R002 Palpitations: Secondary | ICD-10-CM

## 2012-05-25 DIAGNOSIS — Z7901 Long term (current) use of anticoagulants: Secondary | ICD-10-CM

## 2012-05-25 LAB — BASIC METABOLIC PANEL
CO2: 25 mEq/L (ref 19–32)
Calcium: 9 mg/dL (ref 8.4–10.5)
Creatinine, Ser: 1 mg/dL (ref 0.4–1.2)
Glucose, Bld: 189 mg/dL — ABNORMAL HIGH (ref 70–99)

## 2012-05-28 ENCOUNTER — Other Ambulatory Visit: Payer: Self-pay | Admitting: Gastroenterology

## 2012-05-30 ENCOUNTER — Telehealth: Payer: Self-pay

## 2012-05-30 NOTE — Telephone Encounter (Signed)
Message copied by Yolonda Kida on Mon May 30, 2012 11:02 AM ------      Message from: Rollene Rotunda      Created: Thu May 26, 2012  6:30 PM       Labs OK.  Call [name] with the results and send results to Cecile Hearing, MD

## 2012-05-30 NOTE — Telephone Encounter (Signed)
Patient aware of labs and results faxed to pcp

## 2012-05-31 ENCOUNTER — Other Ambulatory Visit: Payer: Self-pay | Admitting: Cardiology

## 2012-05-31 ENCOUNTER — Telehealth: Payer: Self-pay | Admitting: Cardiology

## 2012-05-31 MED ORDER — DILTIAZEM HCL ER COATED BEADS 120 MG PO CP24: 120.0000 mg | ORAL_CAPSULE | Freq: Every day | ORAL | Status: DC

## 2012-05-31 MED ORDER — ATORVASTATIN CALCIUM 20 MG PO TABS: 20.0000 mg | ORAL_TABLET | Freq: Every day | ORAL | Status: DC

## 2012-05-31 NOTE — Telephone Encounter (Signed)
Pt has a problem with her ankle and wearing the compression hose makes it worse so she doesn't want to wear them what else can you suggest

## 2012-05-31 NOTE — Telephone Encounter (Signed)
Spoke with husband who states pt is unable to wear compression stocking due to heal pain and she is unable to walk.  Edema has not resolved per report.  Instructed husband that the pt needs to keep her feet and legs above the level of her heart as much as possible.  He states they do have a hospital bed and this isn't a problem.  He is concerned about her lack of exercise and further muscle wasting.  Husband was also instructed to remove any and all NA from her diet and restrict fluid intake to 1500 cc.  He states understanding.  pts hh nurse was there and took the order as well.  Husband reports pt has been taking both 120 mg of Cardizem and 240 mg daily as the bottles had different names on them.  According to our records the pt was it be on 120 mg a day.  She will continue this dosage.

## 2012-06-01 ENCOUNTER — Ambulatory Visit (INDEPENDENT_AMBULATORY_CARE_PROVIDER_SITE_OTHER): Payer: Medicare Other | Admitting: *Deleted

## 2012-06-01 DIAGNOSIS — R002 Palpitations: Secondary | ICD-10-CM

## 2012-06-01 DIAGNOSIS — I4891 Unspecified atrial fibrillation: Secondary | ICD-10-CM

## 2012-06-01 DIAGNOSIS — Z7901 Long term (current) use of anticoagulants: Secondary | ICD-10-CM

## 2012-06-13 ENCOUNTER — Ambulatory Visit (INDEPENDENT_AMBULATORY_CARE_PROVIDER_SITE_OTHER): Payer: Medicare Other | Admitting: *Deleted

## 2012-06-13 DIAGNOSIS — I4891 Unspecified atrial fibrillation: Secondary | ICD-10-CM

## 2012-06-13 DIAGNOSIS — R002 Palpitations: Secondary | ICD-10-CM

## 2012-06-13 DIAGNOSIS — Z7901 Long term (current) use of anticoagulants: Secondary | ICD-10-CM

## 2012-06-27 ENCOUNTER — Ambulatory Visit (INDEPENDENT_AMBULATORY_CARE_PROVIDER_SITE_OTHER): Payer: Medicare Other | Admitting: Pharmacist

## 2012-06-27 DIAGNOSIS — I4891 Unspecified atrial fibrillation: Secondary | ICD-10-CM

## 2012-06-27 DIAGNOSIS — Z7901 Long term (current) use of anticoagulants: Secondary | ICD-10-CM

## 2012-06-27 DIAGNOSIS — R002 Palpitations: Secondary | ICD-10-CM

## 2012-06-29 ENCOUNTER — Telehealth: Payer: Self-pay | Admitting: Cardiology

## 2012-06-29 NOTE — Telephone Encounter (Signed)
Pt is having a dental cleaning and needed to know if she should hold coumadin.  Instructed Mr Lamar Benes that coumadin generally is not held for routine cleanings.  He stated understanding.

## 2012-06-29 NOTE — Telephone Encounter (Signed)
Pt to have dental work done, is there any cardiac problem in having this done??

## 2012-07-06 NOTE — Progress Notes (Signed)
This encounter was created in error - please disregard.

## 2012-07-18 ENCOUNTER — Ambulatory Visit (INDEPENDENT_AMBULATORY_CARE_PROVIDER_SITE_OTHER): Payer: Medicare Other | Admitting: *Deleted

## 2012-07-18 DIAGNOSIS — Z7901 Long term (current) use of anticoagulants: Secondary | ICD-10-CM

## 2012-07-18 DIAGNOSIS — I4891 Unspecified atrial fibrillation: Secondary | ICD-10-CM

## 2012-07-18 DIAGNOSIS — R002 Palpitations: Secondary | ICD-10-CM

## 2012-08-04 ENCOUNTER — Ambulatory Visit (INDEPENDENT_AMBULATORY_CARE_PROVIDER_SITE_OTHER): Payer: Medicare Other | Admitting: *Deleted

## 2012-08-04 DIAGNOSIS — I4891 Unspecified atrial fibrillation: Secondary | ICD-10-CM

## 2012-08-04 DIAGNOSIS — R002 Palpitations: Secondary | ICD-10-CM

## 2012-08-04 DIAGNOSIS — Z7901 Long term (current) use of anticoagulants: Secondary | ICD-10-CM

## 2012-08-05 ENCOUNTER — Observation Stay (HOSPITAL_COMMUNITY)
Admission: AD | Admit: 2012-08-05 | Discharge: 2012-08-06 | Disposition: A | Discharge status: Home / Self Care | Payer: Medicare Other | Source: Ambulatory Visit | Attending: Internal Medicine | Admitting: Internal Medicine

## 2012-08-05 ENCOUNTER — Encounter (HOSPITAL_COMMUNITY): Payer: Self-pay | Admitting: Internal Medicine

## 2012-08-05 ENCOUNTER — Observation Stay (HOSPITAL_COMMUNITY): Payer: Medicare Other

## 2012-08-05 DIAGNOSIS — Z7901 Long term (current) use of anticoagulants: Secondary | ICD-10-CM | POA: Insufficient documentation

## 2012-08-05 DIAGNOSIS — Z79899 Other long term (current) drug therapy: Secondary | ICD-10-CM | POA: Insufficient documentation

## 2012-08-05 DIAGNOSIS — I10 Essential (primary) hypertension: Secondary | ICD-10-CM | POA: Insufficient documentation

## 2012-08-05 DIAGNOSIS — D72829 Elevated white blood cell count, unspecified: Secondary | ICD-10-CM | POA: Insufficient documentation

## 2012-08-05 DIAGNOSIS — M545 Low back pain, unspecified: Secondary | ICD-10-CM | POA: Insufficient documentation

## 2012-08-05 DIAGNOSIS — K529 Noninfective gastroenteritis and colitis, unspecified: Secondary | ICD-10-CM | POA: Diagnosis present

## 2012-08-05 DIAGNOSIS — Z7902 Long term (current) use of antithrombotics/antiplatelets: Secondary | ICD-10-CM | POA: Insufficient documentation

## 2012-08-05 DIAGNOSIS — I4891 Unspecified atrial fibrillation: Secondary | ICD-10-CM

## 2012-08-05 DIAGNOSIS — R197 Diarrhea, unspecified: Secondary | ICD-10-CM | POA: Insufficient documentation

## 2012-08-05 DIAGNOSIS — R1011 Right upper quadrant pain: Principal | ICD-10-CM | POA: Insufficient documentation

## 2012-08-05 DIAGNOSIS — E119 Type 2 diabetes mellitus without complications: Secondary | ICD-10-CM

## 2012-08-05 DIAGNOSIS — R109 Unspecified abdominal pain: Secondary | ICD-10-CM | POA: Diagnosis present

## 2012-08-05 DIAGNOSIS — K859 Acute pancreatitis without necrosis or infection, unspecified: Secondary | ICD-10-CM | POA: Diagnosis present

## 2012-08-05 DIAGNOSIS — K219 Gastro-esophageal reflux disease without esophagitis: Secondary | ICD-10-CM | POA: Insufficient documentation

## 2012-08-05 DIAGNOSIS — I059 Rheumatic mitral valve disease, unspecified: Secondary | ICD-10-CM | POA: Insufficient documentation

## 2012-08-05 DIAGNOSIS — G8929 Other chronic pain: Secondary | ICD-10-CM | POA: Insufficient documentation

## 2012-08-05 LAB — COMPREHENSIVE METABOLIC PANEL
ALT: 14 U/L (ref 0–35)
AST: 23 U/L (ref 0–37)
Albumin: 4 g/dL (ref 3.5–5.2)
CO2: 22 mEq/L (ref 19–32)
Calcium: 9.6 mg/dL (ref 8.4–10.5)
Creatinine, Ser: 0.98 mg/dL (ref 0.50–1.10)
Sodium: 137 mEq/L (ref 135–145)
Total Protein: 7.9 g/dL (ref 6.0–8.3)

## 2012-08-05 LAB — GLUCOSE, CAPILLARY: Glucose-Capillary: 187 mg/dL — ABNORMAL HIGH (ref 70–99)

## 2012-08-05 LAB — LIPASE, BLOOD: Lipase: 53 U/L (ref 11–59)

## 2012-08-05 LAB — CBC
MCH: 23.9 pg — ABNORMAL LOW (ref 26.0–34.0)
MCHC: 31.9 g/dL (ref 30.0–36.0)
MCV: 74.8 fL — ABNORMAL LOW (ref 78.0–100.0)
Platelets: 232 10*3/uL (ref 150–400)
RBC: 4.61 MIL/uL (ref 3.87–5.11)
RDW: 14.9 % (ref 11.5–15.5)

## 2012-08-05 LAB — DIGOXIN LEVEL: Digoxin Level: 0.5 ng/mL — ABNORMAL LOW (ref 0.8–2.0)

## 2012-08-05 LAB — URINALYSIS, ROUTINE W REFLEX MICROSCOPIC
Bilirubin Urine: NEGATIVE
Glucose, UA: NEGATIVE mg/dL
Hgb urine dipstick: NEGATIVE
Specific Gravity, Urine: 1.008 (ref 1.005–1.030)
Urobilinogen, UA: 0.2 mg/dL (ref 0.0–1.0)
pH: 6.5 (ref 5.0–8.0)

## 2012-08-05 LAB — AMYLASE: Amylase: 73 U/L (ref 0–105)

## 2012-08-05 LAB — URINE MICROSCOPIC-ADD ON

## 2012-08-05 MED ORDER — WARFARIN - PHARMACIST DOSING INPATIENT: Freq: Every day | Status: DC

## 2012-08-05 MED ORDER — SODIUM CHLORIDE 0.9 % IJ SOLN: 3.0000 mL | Freq: Two times a day (BID) | INTRAMUSCULAR | Status: DC

## 2012-08-05 MED ORDER — DIGOXIN 125 MCG PO TABS
125.0000 ug | ORAL_TABLET | ORAL | Status: DC
  Administered 2012-08-06: 125 ug via ORAL
  Filled 2012-08-05: qty 1

## 2012-08-05 MED ORDER — WARFARIN SODIUM 2.5 MG PO TABS
2.5000 mg | ORAL_TABLET | Freq: Once | ORAL | Status: AC
  Administered 2012-08-05: 2.5 mg via ORAL
  Filled 2012-08-05: qty 1

## 2012-08-05 MED ORDER — METHYLPHENIDATE HCL 5 MG PO TABS
5.0000 mg | ORAL_TABLET | Freq: Two times a day (BID) | ORAL | Status: DC
  Administered 2012-08-05 – 2012-08-06 (×2): 5 mg via ORAL
  Filled 2012-08-05 (×2): qty 1

## 2012-08-05 MED ORDER — ADULT MULTIVITAMIN W/MINERALS CH
1.0000 | ORAL_TABLET | Freq: Every day | ORAL | Status: DC
  Administered 2012-08-05 – 2012-08-06 (×2): 1 via ORAL
  Filled 2012-08-05 (×2): qty 1

## 2012-08-05 MED ORDER — SERTRALINE HCL 50 MG PO TABS
50.0000 mg | ORAL_TABLET | Freq: Every evening | ORAL | Status: DC
  Administered 2012-08-05: 50 mg via ORAL
  Filled 2012-08-05 (×2): qty 1

## 2012-08-05 MED ORDER — ONDANSETRON HCL 4 MG/2ML IJ SOLN: 4.0000 mg | Freq: Four times a day (QID) | INTRAMUSCULAR | Status: DC | PRN

## 2012-08-05 MED ORDER — WARFARIN SODIUM 2.5 MG PO TABS: 2.5000 mg | ORAL_TABLET | Freq: Every day | ORAL | Status: DC

## 2012-08-05 MED ORDER — INSULIN ASPART 100 UNIT/ML ~~LOC~~ SOLN
0.0000 [IU] | Freq: Three times a day (TID) | SUBCUTANEOUS | Status: DC
  Administered 2012-08-06 (×2): 3 [IU] via SUBCUTANEOUS

## 2012-08-05 MED ORDER — ACETAMINOPHEN 325 MG PO TABS: 650.0000 mg | ORAL_TABLET | Freq: Four times a day (QID) | ORAL | Status: DC | PRN

## 2012-08-05 MED ORDER — MAGNESIUM HYDROXIDE 400 MG/5ML PO SUSP: 30.0000 mL | Freq: Every day | ORAL | Status: DC | PRN

## 2012-08-05 MED ORDER — DIGOXIN 0.0625 MG HALF TABLET: 0.0625 mg | ORAL_TABLET | ORAL | Status: DC

## 2012-08-05 MED ORDER — GLIMEPIRIDE 4 MG PO TABS
4.0000 mg | ORAL_TABLET | Freq: Two times a day (BID) | ORAL | Status: DC
  Administered 2012-08-05 – 2012-08-06 (×2): 4 mg via ORAL
  Filled 2012-08-05 (×3): qty 1

## 2012-08-05 MED ORDER — FLEET ENEMA 7-19 GM/118ML RE ENEM
1.0000 | ENEMA | Freq: Once | RECTAL | Status: AC | PRN
  Filled 2012-08-05: qty 1

## 2012-08-05 MED ORDER — ENOXAPARIN SODIUM 40 MG/0.4ML ~~LOC~~ SOLN: 40.0000 mg | SUBCUTANEOUS | Status: DC

## 2012-08-05 MED ORDER — DIGOXIN 0.0625 MG HALF TABLET
62.5000 ug | ORAL_TABLET | Freq: Every day | ORAL | Status: DC
  Filled 2012-08-05: qty 2

## 2012-08-05 MED ORDER — ENSURE COMPLETE PO LIQD
237.0000 mL | Freq: Two times a day (BID) | ORAL | Status: DC
  Administered 2012-08-06: 237 mL via ORAL

## 2012-08-05 MED ORDER — ALPRAZOLAM 0.5 MG PO TABS: 0.5000 mg | ORAL_TABLET | Freq: Four times a day (QID) | ORAL | Status: DC | PRN

## 2012-08-05 MED ORDER — OLANZAPINE 5 MG PO TABS
5.0000 mg | ORAL_TABLET | Freq: Every day | ORAL | Status: DC
  Administered 2012-08-05: 5 mg via ORAL
  Filled 2012-08-05 (×2): qty 1

## 2012-08-05 MED ORDER — POTASSIUM CHLORIDE IN NACL 20-0.9 MEQ/L-% IV SOLN
INTRAVENOUS | Status: DC
  Administered 2012-08-05 – 2012-08-06 (×2): via INTRAVENOUS
  Filled 2012-08-05 (×3): qty 1000

## 2012-08-05 MED ORDER — INSULIN ASPART 100 UNIT/ML ~~LOC~~ SOLN: 0.0000 [IU] | Freq: Every day | SUBCUTANEOUS | Status: DC

## 2012-08-05 MED ORDER — METOPROLOL TARTRATE 25 MG PO TABS
25.0000 mg | ORAL_TABLET | Freq: Two times a day (BID) | ORAL | Status: DC
  Administered 2012-08-05 – 2012-08-06 (×2): 25 mg via ORAL
  Filled 2012-08-05 (×3): qty 1

## 2012-08-05 MED ORDER — LATANOPROST 0.005 % OP SOLN
1.0000 [drp] | Freq: Every day | OPHTHALMIC | Status: DC
  Administered 2012-08-05: 1 [drp] via OPHTHALMIC
  Filled 2012-08-05: qty 2.5

## 2012-08-05 MED ORDER — DILTIAZEM HCL ER COATED BEADS 120 MG PO CP24
120.0000 mg | ORAL_CAPSULE | Freq: Every day | ORAL | Status: DC
  Administered 2012-08-05 – 2012-08-06 (×2): 120 mg via ORAL
  Filled 2012-08-05 (×2): qty 1

## 2012-08-05 MED ORDER — ONDANSETRON HCL 4 MG PO TABS: 4.0000 mg | ORAL_TABLET | Freq: Four times a day (QID) | ORAL | Status: DC | PRN

## 2012-08-05 MED ORDER — OXYCODONE HCL 5 MG PO TABS
5.0000 mg | ORAL_TABLET | ORAL | Status: DC | PRN
  Administered 2012-08-05: 5 mg via ORAL
  Filled 2012-08-05: qty 1

## 2012-08-05 MED ORDER — ZOLPIDEM TARTRATE 5 MG PO TABS
5.0000 mg | ORAL_TABLET | Freq: Every evening | ORAL | Status: DC | PRN
  Administered 2012-08-06: 5 mg via ORAL
  Filled 2012-08-05: qty 1

## 2012-08-05 MED ORDER — PANTOPRAZOLE SODIUM 40 MG PO TBEC
40.0000 mg | DELAYED_RELEASE_TABLET | Freq: Every day | ORAL | Status: DC
  Administered 2012-08-05 – 2012-08-06 (×2): 40 mg via ORAL
  Filled 2012-08-05 (×2): qty 1

## 2012-08-05 MED ORDER — ACETAMINOPHEN 650 MG RE SUPP: 650.0000 mg | Freq: Four times a day (QID) | RECTAL | Status: DC | PRN

## 2012-08-05 MED ORDER — ALUM & MAG HYDROXIDE-SIMETH 200-200-20 MG/5ML PO SUSP: 30.0000 mL | Freq: Four times a day (QID) | ORAL | Status: DC | PRN

## 2012-08-05 NOTE — Progress Notes (Signed)
Notified Dr. Jacky Kindle that patient's EKG showed Atrial fibrillation with premature ventricular or aberrantly conducted complexes, cannot rule out Inferior Infarct. Dr. Jacky Kindle stated okay. No new orders given. Will continue to monitor patient. Jerrye Bushy

## 2012-08-05 NOTE — H&P (Addendum)
[name] [name] is an 78 y.o. female.   Chief Complaint: abdominal pain HPI:  The patient is an 78 year old Caucasian woman with a complicated past medical history who presents today to the office with a vague history of abdominal pain.  The pain was described as primarily in the right upper quadrant and intermittent, not affected by meals, and not associated with fever, chills, nausea, or vomiting.  She was able to eat a normal breakfast today but had a sense that she could have risk current pancreatitis.  She has had episodes of pancreatitis in 2008 and in March 2013.  Her medical history is otherwise significant for diabetes mellitus, type II that is under poor control and managed by an endocrinologist, chronic atrial fibrillation, chronic Coumadin anticoagulation, hyperlipidemia, chronic venous insufficiency, chronic loose bowel movements with lactose intolerance, gastroesophageal reflux disease, depression, chronic low back pain, and a July 2013 hospitalization for acute pyelonephritis with systemic inflammatory response syndrome.  Past Medical History  Diagnosis Date  . Pancreatitis     ~2008 and again in 11/2011 s/p ERCP  . Hypertension   . Glaucoma(365)   . High cholesterol   . GERD (gastroesophageal reflux disease)   . H/O hiatal hernia   . Atrial fibrillation   . Dizziness - light-headed   . Pneumonia 12/2011    "first time I know about"  . Shortness of breath on exertion   . Type II diabetes mellitus   . Depression   . Chronic lower back pain     Medications Prior to Admission  Medication Sig Dispense Refill  . acetaminophen (TYLENOL) 325 MG tablet Take 2 tablets (650 mg total) by mouth every 6 (six) hours as needed for fever.      . ALPRAZolam (XANAX) 1 MG tablet Take 0.5 tablets (0.5 mg total) by mouth 4 (four) times daily as needed for anxiety. For sleep or anxiety.  8 tablet  0  . atorvastatin (LIPITOR) 20 MG tablet Take 1 tablet (20 mg total) by mouth daily at 6 PM.  90  tablet  3  . digoxin (LANOXIN) 0.125 MG tablet Take 62.5-125 mcg by mouth daily. Take on Tuesdays, Thursdays, Saturdays, and Sundays.  Take 62. on all other days.      Marland Kitchen diltiazem (CARDIZEM CD) 120 MG 24 hr capsule Take 1 capsule (120 mg total) by mouth daily.  90 capsule  3  . feeding supplement (ENSURE COMPLETE) LIQD Take 237 mLs by mouth 2 (two) times daily between meals.      . furosemide (LASIX) 40 MG tablet Take 40 mg by mouth 3 (three) times a week. Take 1 tablet on Mondays, Wednesdays, and Saturdays.      Marland Kitchen glimepiride (AMARYL) 4 MG tablet Take 4 mg by mouth 2 (two) times daily.      Marland Kitchen latanoprost (XALATAN) 0.005 % ophthalmic solution Place 1 drop into both eyes at bedtime.      . methylphenidate (RITALIN) 5 MG tablet Take 5 mg by mouth 2 (two) times daily.      . metoprolol tartrate (LOPRESSOR) 25 MG tablet Take 1 tablet (25 mg total) by mouth 2 (two) times daily.      . Multiple Vitamin (MULTIVITAMIN WITH MINERALS) TABS Take 1 tablet by mouth daily.      Marland Kitchen OLANZapine (ZYPREXA) 5 MG tablet Take 5 mg by mouth at bedtime.      Marland Kitchen omeprazole (PRILOSEC) 20 MG capsule Take 20 mg by mouth 2 (two) times daily.       Marland Kitchen  sertraline (ZOLOFT) 50 MG tablet Take 50 mg by mouth every evening.      . warfarin (COUMADIN) 5 MG tablet Take 2.5-5 mg by mouth daily. Take 2.5 mg each day except take 5mg  on Thursdays        ADDITIONAL HOME MEDICATIONS: Hyoscyamine and 0.125 mg sublingual every 4 hours as needed for pain, Ambien 5 mg by mouth daily at bedtime, albuterol 2.5 mg nebulizer treatments 3 times a day when necessary, Coumadin 5 mg tablets take one half tab by mouth every day except Thursday take one whole tablet, sertraline 50 mg by mouth daily, omeprazole 20 mg by mouth twice daily, metoprolol 25 mg by mouth twice daily, methylphenidate 5 mg by mouth twice daily, Xalatan 0.005% one drop each eye daily at bedtime, Amaryl 4 mg by mouth twice daily, furosemide 40 mg by mouth every Monday,  Wednesday, and Saturday, Cardizem CD 120 mg by mouth daily, digoxin 0.125 mg by mouth every Tuesday, Thursday, Saturday, and Sunday and one half tablet on other days, Lipitor 20 mg by mouth daily, Xanax 1 mg by mouth daily at bedtime when necessary sleep, Tylenol 325 mg every 6 hours as needed  PHYSICIANS INVOLVED IN CARE: Izell Seaside [facility] Endocrinology), Angelina Sheriff [facility] cardiology), Jeani Hawking (GI), Avie Echevaria (neurology), Dr. Nelle Don (oph)   Past Surgical History  Procedure Date  . Ercp 11/13/2011    Procedure: ENDOSCOPIC RETROGRADE CHOLANGIOPANCREATOGRAPHY (ERCP);  Surgeon: Theda Belfast, MD;  Location: [facility] ENDOSCOPY;  Service: Endoscopy;  Laterality: N/A;  . Tonsillectomy and adenoidectomy ~ 1939  . Cholecystectomy 1990's  . Appendectomy ~ 1960  . Vaginal hysterectomy ~ 1960's  . Dilation and curettage of uterus   . Cataract extraction w/ intraocular lens  implant, bilateral 2000's    Family History  Problem Relation Age of Onset  . Anesthesia problems Neg Hx   . Hypotension Neg Hx   . Malignant hyperthermia Neg Hx   . Pseudochol deficiency Neg Hx   . Stroke Father 10  . Stroke Mother 68  Father died at age 57 years.  Her mother died at age 8 years and had diabetes mellitus, type II, coronary artery disease, and hypertension.  She has 2 brothers who have diabetes mellitus, type II and a sister who had diabetes mellitus, type II and has died.  There is no family history of breast cancer or colon cancer.   Social History:  reports that she has never smoked. She has never used smokeless tobacco. She reports that she does not drink alcohol or use illicit drugs. He has been married for over 26 years.  They have no children.  She has no history of tobacco or alcohol abuse.  Allergies:  Allergies  Allergen Reactions  . Lactose Intolerance (Gi) Nausea And Vomiting    severe stomach pain  . Penicillins Hives and Itching    "haven't used any since 1970's"  . Sulfa  Antibiotics Hives and Itching    "haven't used any since 1970's"     ROS: ankle swelling, arthritis, asthma, cataracts and diabetes  PHYSICAL EXAM: Blood pressure 121/73, pulse 92, temperature 98 F (36.7 C), temperature source Oral, resp. rate 18, height 5' (1.524 m), weight 53.524 kg (118 lb), SpO2 99.00%. In general, the patient is an elderly white woman who was having frequent moaning during her examination and could not explain why she was doing so.  HEENT exam was within normal limits, neck was supple without jugular venous distention or carotid bruit, chest was  clear to auscultation, heart had an irregular rhythm without significant murmur or gallop, abdomen had normal bowel sounds and mild right upper quadrant and left upper quadrant tenderness without rebound, there was a sense of retained stool throughout her colon, she had bilateral 1+ ankle edema and pedal pulses were 1+ bilaterally.  Neurologic exam: She was alert and answered questions appropriately, she can move all extremities well, she required moderate one-person assistance to change from a standing position to a seated position on the exam table, she walked very slowly with short steps and had bilateral 4-5 quadriceps strength.  Skin exam showed no areas of breakdown  Results for orders placed in visit on 08/04/12 (from the past 48 hour(s))  POCT INR     Status: Normal   Collection Time   08/04/12 11:56 AM      Component Value Range Comment   INR 2.4      No results found.  Today office hemoglobin A1c level result was 9.9% (average capillary blood glucose level 228)  Assessment/Plan #1 Right Upper Quadrant Abdominal Pain: The patient is a vague historian and presents with right upper quadrant and left upper quadrant abdominal discomfort.  She does not have significant epigastric discomfort.  Certainly her symptoms could be due to a somewhat atypical presentation for acute pancreatitis.  Her symptoms could also be due to  constipation, peptic ulcer disease, hepatobiliary disease, or intestinal ischemia.  Intestinal ischemia is less likely given no history of food fear or change of symptoms with food ingestion.  We will check a CBC, complete metabolic panel, serum amylase and lipase, and a CT scan of the abdomen and pelvis, as well as a urinalysis, for further evaluation.  If all labs are normal and CT scan shows no acute findings then she may be able to go home as early as tomorrow with treatment of constipation. #2 Atrial Fibrillation: A chronic issue for her and she has a stable ventricular rate and therapeutic INR level. #3 Diabetes Mellitus, Type II: remains under poor control with hemoglobin A1c level of 9.9% today in the office.  As mentioned above her diabetes is treated by an endocrinologist.  For now we will continue Amaryl and add sliding scale insulin as needed.  Somya Jauregui G 08/05/2012, 6:01 PM

## 2012-08-05 NOTE — Progress Notes (Addendum)
ANTICOAGULATION CONSULT NOTE - Initial Consult  Pharmacy Consult for Coumadin Indication: atrial fibrillation  Allergies  Allergen Reactions  . Lactose Intolerance (Gi) Nausea And Vomiting    severe stomach pain  . Penicillins Hives and Itching    "haven't used any since 1970's"  . Sulfa Antibiotics Hives and Itching    "haven't used any since 1970's"    Labs:  [facility] 08/04/12 1156  HGB --  HCT --  PLT --  APTT --  LABPROT --  INR 2.4  HEPARINUNFRC --  CREATININE --  CKTOTAL --  CKMB --  TROPONINI --    Estimated Creatinine Clearance: 32.2 ml/min (by C-G formula based on Cr of 1).   Medical History: Past Medical History  Diagnosis Date  . Pancreatitis     ~2008 and again in 11/2011 s/p ERCP  . Hypertension   . Glaucoma(365)   . High cholesterol   . GERD (gastroesophageal reflux disease)   . H/O hiatal hernia   . Atrial fibrillation   . Dizziness - light-headed   . Pneumonia 12/2011    "first time I know about"  . Shortness of breath on exertion   . Type II diabetes mellitus   . Depression   . Chronic lower back pain     Assessment: 78 year old female admitted with a vague history of abdominal pain.  She has a history of pancreatitis in 2008 and in March of 2013.  Workup underway  She was taking Coumadin PTA for AFib  Last INR was therapeutic at 2.4 (10/31)  Home dose of Coumadin is 2.5 mg daily with 5 mg on Thursdays.   Goal of Therapy:  INR 2-3 Monitor platelets by anticoagulation protocol: Yes   Plan:  1) Coumadin 2.5 mg po x 1 dose tonight  2) Daily INR  Thank you. Okey Regal, PharmD [phone]  08/05/2012,6:54 PM

## 2012-08-05 NOTE — Progress Notes (Addendum)
NURSING PROGRESS NOTE  [name] [name] [id number] Admission Data: 08/05/2012 5:43 PM Attending Provider: Jarome Matin, MD WUJ:WJXBJYN, ROBERT, MD Code Status: full   [name] [name] is a 78 y.o. female patient admitted from ED  No acute distress noted.  No c/o shortness of breath, no c/o chest pain.  Cardiac tele # [phone], in place, cardiac monitor yields:A-fib.  Blood pressure 121/73, pulse 92, temperature 98 F (36.7 C), temperature source Oral, height 5' (1.524 m), weight 53.524 kg (118 lb), SpO2 99.00%.   IV Fluids:  IV in place, occlusive dsg intact without redness, IV cath hand right, condition patent and no redness none.   Allergies:  Lactose intolerance (gi); Penicillins; and Sulfa antibiotics  Past Medical History:   has a past medical history of Pancreatitis; Hypertension; Glaucoma; High cholesterol; GERD (gastroesophageal reflux disease); H/O hiatal hernia; Atrial fibrillation; Dizziness - light-headed; Pneumonia (12/2011); Shortness of breath on exertion; Type II diabetes mellitus; Depression; and Chronic lower back pain.  Past Surgical History:   has past surgical history that includes ERCP (11/13/2011); Tonsillectomy and adenoidectomy (~ 1939); Cholecystectomy (1990's); Appendectomy (~ 1960); Vaginal hysterectomy (~ 1960's); Dilation and curettage of uterus; and Cataract extraction w/ intraocular lens  implant, bilateral (2000's).  Social History:   reports that she has never smoked. She has never used smokeless tobacco. She reports that she does not drink alcohol or use illicit drugs.  Skin: intact  Orientation to room, and floor completed with information packet given to patient/family. Admission INP armband ID verified with patient/family, and in place.   SR up x 2, fall assessment complete, with patient and family able to verbalize understanding of risk associated with falls, and verbalized understanding to call for assistance before getting out of bed.   Call  light within reach. Patient able to voice and demonstrate understanding of unit orientation instructions.   Will cont to eval and treat per MD orders.  Boe Deans, Elmarie Mainland, RN

## 2012-08-06 ENCOUNTER — Observation Stay (HOSPITAL_COMMUNITY): Payer: Medicare Other

## 2012-08-06 LAB — CBC
HCT: 33 % — ABNORMAL LOW (ref 36.0–46.0)
Hemoglobin: 10.5 g/dL — ABNORMAL LOW (ref 12.0–15.0)
MCH: 24 pg — ABNORMAL LOW (ref 26.0–34.0)
MCHC: 31.8 g/dL (ref 30.0–36.0)
RBC: 4.38 MIL/uL (ref 3.87–5.11)

## 2012-08-06 LAB — BASIC METABOLIC PANEL
BUN: 24 mg/dL — ABNORMAL HIGH (ref 6–23)
Chloride: 98 mEq/L (ref 96–112)
GFR calc non Af Amer: 43 mL/min — ABNORMAL LOW (ref >90)
Glucose, Bld: 162 mg/dL — ABNORMAL HIGH (ref 70–99)
Potassium: 4.1 mEq/L (ref 3.5–5.1)
Sodium: 133 mEq/L — ABNORMAL LOW (ref 135–145)

## 2012-08-06 LAB — LIPASE, BLOOD: Lipase: 52 U/L (ref 11–59)

## 2012-08-06 MED ORDER — CIPROFLOXACIN HCL 500 MG PO TABS: 500.0000 mg | ORAL_TABLET | Freq: Two times a day (BID) | ORAL | Status: DC

## 2012-08-06 MED ORDER — IOHEXOL 300 MG/ML  SOLN
100.0000 mL | Freq: Once | INTRAMUSCULAR | Status: AC | PRN
  Administered 2012-08-06: 100 mL via INTRAVENOUS

## 2012-08-06 NOTE — Progress Notes (Signed)
NURSING PROGRESS NOTE  [name] [name] [id number] Discharge Data: 08/06/2012 1:25 PM Attending Provider: Jarome Matin, MD WUJ:WJXBJYN, ROBERT, MD     Cristobal Goldmann to be D/C'd Home per MD order.  Discussed with the patient and family the After Visit Summary and all questions fully answered. All IV's discontinued with no bleeding noted. All belongings returned to patient for patient to take home.   Last Vital Signs:  Blood pressure 110/68, pulse 76, temperature 98.7 F (37.1 C), temperature source Oral, resp. rate 16, height 5' (1.524 m), weight 53.524 kg (118 lb), SpO2 93.00%.  Discharge Medication List   Medication List     As of 08/06/2012  1:25 PM    TAKE these medications         acetaminophen 325 MG tablet   Commonly known as: TYLENOL   Take 2 tablets (650 mg total) by mouth every 6 (six) hours as needed for fever.      ALPRAZolam 1 MG tablet   Commonly known as: XANAX   Take 0.5 tablets (0.5 mg total) by mouth 4 (four) times daily as needed for anxiety. For sleep or anxiety.      atorvastatin 20 MG tablet   Commonly known as: LIPITOR   Take 1 tablet (20 mg total) by mouth daily at 6 PM.      ciprofloxacin 500 MG tablet   Commonly known as: CIPRO   Take 1 tablet (500 mg total) by mouth 2 (two) times daily.      digoxin 0.125 MG tablet   Commonly known as: LANOXIN   Take 62.5-125 mcg by mouth daily. Take on Tuesdays, Thursdays, Saturdays, and Sundays.  Take 62. on all other days.      diltiazem 120 MG 24 hr capsule   Commonly known as: CARDIZEM CD   Take 1 capsule (120 mg total) by mouth daily.      feeding supplement Liqd   Take 237 mLs by mouth 2 (two) times daily between meals.      furosemide 40 MG tablet   Commonly known as: LASIX   Take 40 mg by mouth 3 (three) times a week. Take 1 tablet on Mondays, Wednesdays, and Saturdays.      glimepiride 4 MG tablet   Commonly known as: AMARYL   Take 4 mg by mouth 2 (two) times daily.     latanoprost 0.005 % ophthalmic solution   Commonly known as: XALATAN   Place 1 drop into both eyes at bedtime.      methylphenidate 5 MG tablet   Commonly known as: RITALIN   Take 5 mg by mouth 2 (two) times daily.      metoprolol tartrate 25 MG tablet   Commonly known as: LOPRESSOR   Take 1 tablet (25 mg total) by mouth 2 (two) times daily.      multivitamin with minerals Tabs   Take 1 tablet by mouth daily.      OLANZapine 5 MG tablet   Commonly known as: ZYPREXA   Take 5 mg by mouth at bedtime.      omeprazole 20 MG capsule   Commonly known as: PRILOSEC   Take 20 mg by mouth 2 (two) times daily.      sertraline 50 MG tablet   Commonly known as: ZOLOFT   Take 50 mg by mouth every evening.      warfarin 5 MG tablet   Commonly known as: COUMADIN   Take 2.5-5 mg by mouth daily. Take 2.5  mg each day except take 5mg  on Thursdays        Rosalie Doctor, RN

## 2012-08-06 NOTE — Discharge Summary (Signed)
DISCHARGE SUMMARY  [name] [name]  MR#: [id number]  DOB:January 13, 1930  Date of Admission: 08/05/2012 Date of Discharge: 08/06/2012  Attending Physician:Rhena Glace A  Patient's NWG:NFAOZHY, ROBERT, MD  Consults:  none  Discharge Diagnoses: Principal Problem:  *Abdominal pain, acute, right upper quadrant- resolved unclear etiology at this time CT scan negative Possible UTI elevated white count minimal pyuria empiric Cipro Active Problems:  Pancreatitis- by history no evidence at this time exam negative CT negative  DM II (diabetes mellitus, type II), controlled  HTN (hypertension)  Atrial fibrillation, rapid  Chronic diarrhea   Discharge Medications:   Medication List     As of 08/06/2012  9:40 AM    TAKE these medications         acetaminophen 325 MG tablet   Commonly known as: TYLENOL   Take 2 tablets (650 mg total) by mouth every 6 (six) hours as needed for fever.      ALPRAZolam 1 MG tablet   Commonly known as: XANAX   Take 0.5 tablets (0.5 mg total) by mouth 4 (four) times daily as needed for anxiety. For sleep or anxiety.      atorvastatin 20 MG tablet   Commonly known as: LIPITOR   Take 1 tablet (20 mg total) by mouth daily at 6 PM.      ciprofloxacin 500 MG tablet   Commonly known as: CIPRO   Take 1 tablet (500 mg total) by mouth 2 (two) times daily.      digoxin 0.125 MG tablet   Commonly known as: LANOXIN   Take 62.5-125 mcg by mouth daily. Take on Tuesdays, Thursdays, Saturdays, and Sundays.  Take 62. on all other days.      diltiazem 120 MG 24 hr capsule   Commonly known as: CARDIZEM CD   Take 1 capsule (120 mg total) by mouth daily.      feeding supplement Liqd   Take 237 mLs by mouth 2 (two) times daily between meals.      furosemide 40 MG tablet   Commonly known as: LASIX   Take 40 mg by mouth 3 (three) times a week. Take 1 tablet on Mondays, Wednesdays, and Saturdays.      glimepiride 4 MG tablet   Commonly known as: AMARYL    Take 4 mg by mouth 2 (two) times daily.      latanoprost 0.005 % ophthalmic solution   Commonly known as: XALATAN   Place 1 drop into both eyes at bedtime.      methylphenidate 5 MG tablet   Commonly known as: RITALIN   Take 5 mg by mouth 2 (two) times daily.      metoprolol tartrate 25 MG tablet   Commonly known as: LOPRESSOR   Take 1 tablet (25 mg total) by mouth 2 (two) times daily.      multivitamin with minerals Tabs   Take 1 tablet by mouth daily.      OLANZapine 5 MG tablet   Commonly known as: ZYPREXA   Take 5 mg by mouth at bedtime.      omeprazole 20 MG capsule   Commonly known as: PRILOSEC   Take 20 mg by mouth 2 (two) times daily.      sertraline 50 MG tablet   Commonly known as: ZOLOFT   Take 50 mg by mouth every evening.      warfarin 5 MG tablet   Commonly known as: COUMADIN   Take 2.5-5 mg by mouth daily. Take 2.5 mg each  day except take 5mg  on Thursdays        Hospital Procedures: Ct Abdomen Pelvis W Contrast  08/06/2012  *RADIOLOGY REPORT*  Clinical Data: Vague history of abdominal pain in the right upper quadrant.  CT ABDOMEN AND PELVIS WITH CONTRAST  Technique:  Multidetector CT imaging of the abdomen and pelvis was performed following the standard protocol during bolus administration of intravenous contrast.  Contrast: OMNIPAQUE IOHEXOL 300 MG/ML  SOLN  Comparison: 01/12/2012  Findings: The lung bases are clear.  Calcification in the mitral valve annulus.  Mild diffuse low attenuation change in the liver consistent with diffuse fatty infiltration.  Surgical absence of the gallbladder. The spleen size is normal.  The pancreas, adrenal glands, and retroperitoneal lymph nodes are unremarkable.  There is a retroaortic left renal vein.  Calcification of the aorta without aneurysm.  Cysts in the right kidney, stable since previous study. No solid mass or hydronephrosis in either kidney.  Scattered celiac axis lymph nodes are not pathologically enlarged  and appears stable since previous study.  The stomach and small bowel are decompressed.  No colonic wall thickening or distension.  No free air or free fluid in the abdomen.  Pelvis:  The uterus is surgically absent.  Bladder wall is not thickened.  No free or loculated pelvic fluid collections. Diverticula in the sigmoid colon without diverticulitis.  The appendix is surgically absent by history.  Degenerative changes in the lumbar spine.  IMPRESSION: Diffuse fatty infiltration of the liver.  Right renal cyst.  No acute process demonstrated in the abdomen or pelvis.  No evidence of recurrent pancreatitis.   Original Report Authenticated By: Burman Nieves, M.D.    Acute Abdominal Series  08/05/2012  *RADIOLOGY REPORT*  Clinical Data: Right upper quadrant abdominal pain.  ACUTE ABDOMEN SERIES (ABDOMEN 2 VIEW & CHEST 1 VIEW)  Comparison: Chest x-ray 04/11/2012.  Acute abdominal series 03/13/2012.  Findings: Lung volumes are normal.  No consolidative airspace disease.  No pleural effusions.  No pneumothorax.  No pulmonary nodule or mass noted.  Pulmonary vasculature and the cardiomediastinal silhouette are within normal limits. Atherosclerosis in the thoracic aorta.  Extensive mitral annular calcification.  Linear opacity in the left mid lung is unchanged and compatible with this R.  Gas and stool are seen scattered throughout the colon extending to the level of the distal rectum.  No pathologic distension of small bowel is noted.  No gross evidence of pneumoperitoneum.  Surgical clips project over the right quadrant of the abdomen, likely from prior cholecystectomy.  IMPRESSION: 1.  Nonobstructive bowel gas pattern. 2.  No pneumoperitoneum. 3.  No radiographic evidence of acute cardiopulmonary disease. 4.  Atherosclerosis. 5.  Severe mitral annular calcifications.   Original Report Authenticated By: Trudie Reed, M.D.     History of Present Illness: The patient is an 78 year old Caucasian woman with a  complicated past medical history who presents today to the office with a vague history of abdominal pain. The pain was described as primarily in the right upper quadrant and intermittent, not affected by meals, and not associated with fever, chills, nausea, or vomiting. She was able to eat a normal breakfast today but had a sense that she could have risk current pancreatitis. She has had episodes of pancreatitis in 2008 and in March 2013. Her medical history is otherwise significant for diabetes mellitus, type II that is under poor control and managed by an endocrinologist, chronic atrial fibrillation, chronic Coumadin anticoagulation, hyperlipidemia, chronic venous insufficiency, chronic loose  bowel movements with lactose intolerance, gastroesophageal reflux disease, depression, chronic low back pain, and a July 2013 hospitalization for acute pyelonephritis with systemic inflammatory response syndrome.    Hospital Course: Patient was omitted overnight for observation and further workup of his vague upper quadrant pain given her complex history. No therapy was started. CT scan was done this revealed no evidence of intra-abdominal or pelvic pathology. Chest x-ray was clear with no evidence of basilar infiltrate or pneumonia. Exam on the morning of this discharge is completely benign as she has no GI symptoms and no findings on exam. She does have the elevated white count but no underlying basis for this at this time. Urine was somewhat suggestive of possible UTI albeit this distress she is started empirically on some Cipro for home discharge. Her breakfast was 100% consumed and she is discharged for further as an outpatient this time with certainly no evidence of pancreatitis or ongoing intra-abdominal pathology.  Day of Discharge Exam BP 96/56  Pulse 76  Temp 98.7 F (37.1 C) (Oral)  Resp 16  Ht 5' (1.524 m)  Wt 53.524 kg (118 lb)  BMI 23.05 kg/m2  SpO2 93%  Physical Exam: Patient is awake alert no  distress. Neurologically she is conversant appropriate nonlateralizing. No JVD or bruits. Lungs are clear. Cardiovascular exam is irregular 1/6 systolic ejection murmur rate in the 70s. Abdomen is completely soft bowel sounds normal nontender no rebound or guarding nondistended. Extremities no cyanosis clubbing or edema. Intact distal pulses. Skin is without rash. Neurologically she is awake alert conversant following commands motor exam is 5 out of 5.   Discharge Labs:  [facility] 08/06/12 0550 08/05/12 1956  NA 133* 137  K 4.1 3.3*  CL 98 99  CO2 19 22  GLUCOSE 162* 171*  BUN 24* 21  CREATININE 1.16* 0.98  CALCIUM 8.9 9.6  MG -- --  PHOS -- --    Basename 08/05/12 1956  AST 23  ALT 14  ALKPHOS 96  BILITOT 0.8  PROT 7.9  ALBUMIN 4.0    Basename 08/06/12 0550 08/05/12 1956  WBC 15.1* 13.7*  NEUTROABS -- --  HGB 10.5* 11.0*  HCT 33.0* 34.5*  MCV 75.3* 74.8*  PLT 250 232   No results found for this basename: CKTOTAL:3,CKMB:3,CKMBINDEX:3,TROPONINI:3 in the last 72 hours No results found for this basename: TSH,T4TOTAL,FREET3,T3FREE,THYROIDAB in the last 72 hours No results found for this basename: VITAMINB12:2,FOLATE:2,FERRITIN:2,TIBC:2,IRON:2,RETICCTPCT:2 in the last 72 hours  Discharge instructions:     Discharge Orders    Future Appointments: Provider: Department: Dept Phone: Center:   08/25/2012 11:45 AM [facility] [phone] None   09/22/2012 1:45 PM Rollene Rotunda, MD [facility] [phone] [id number]     Future Orders Please Complete By Expires   Diet - low sodium heart healthy      Increase activity slowly         Disposition: Home  Follow-up Appts: Dr. Jarold Motto early next week  Condition on Discharge: *Improved in stable Tests Needing Follow-up: *Completion of antibiotic further pending her response and evolution of symptoms Signed: Cobie Leidner A 08/06/2012, 9:40 AM

## 2012-08-25 ENCOUNTER — Ambulatory Visit (INDEPENDENT_AMBULATORY_CARE_PROVIDER_SITE_OTHER): Payer: Medicare Other | Admitting: *Deleted

## 2012-08-25 DIAGNOSIS — Z7901 Long term (current) use of anticoagulants: Secondary | ICD-10-CM

## 2012-08-25 DIAGNOSIS — R002 Palpitations: Secondary | ICD-10-CM

## 2012-08-25 DIAGNOSIS — I4891 Unspecified atrial fibrillation: Secondary | ICD-10-CM

## 2012-08-31 ENCOUNTER — Other Ambulatory Visit: Payer: Self-pay | Admitting: *Deleted

## 2012-08-31 MED ORDER — WARFARIN SODIUM 5 MG PO TABS: ORAL_TABLET | ORAL | Status: DC

## 2012-09-08 ENCOUNTER — Ambulatory Visit: Payer: Self-pay | Admitting: Pharmacist

## 2012-09-08 DIAGNOSIS — R002 Palpitations: Secondary | ICD-10-CM

## 2012-09-08 DIAGNOSIS — I4891 Unspecified atrial fibrillation: Secondary | ICD-10-CM

## 2012-09-08 DIAGNOSIS — Z7901 Long term (current) use of anticoagulants: Secondary | ICD-10-CM

## 2012-09-22 ENCOUNTER — Ambulatory Visit (INDEPENDENT_AMBULATORY_CARE_PROVIDER_SITE_OTHER): Payer: Medicare Other | Admitting: Cardiology

## 2012-09-22 ENCOUNTER — Encounter: Payer: Self-pay | Admitting: Cardiology

## 2012-09-22 VITALS — BP 115/70 | HR 79 | Ht 60.0 in | Wt 120.0 lb

## 2012-09-22 DIAGNOSIS — I4891 Unspecified atrial fibrillation: Secondary | ICD-10-CM

## 2012-09-22 DIAGNOSIS — E7800 Pure hypercholesterolemia, unspecified: Secondary | ICD-10-CM

## 2012-09-22 DIAGNOSIS — I10 Essential (primary) hypertension: Secondary | ICD-10-CM

## 2012-09-22 NOTE — Progress Notes (Signed)
HPI The patient presents for evaluation of atrial fibrillation.  She remains in this rhythm and on anticoagulation. She is very frail following her hospitalization earlier this year. When I saw her previously she was even more somnolent than she is currently. Her husband says that she did get better for a while but seemed to regress and he thinks this was around the time she was changed from simvastatin to atorvastatin. She has generalized leg weakness. She walks with a walker. She's not describing any acute shortness of breath, PND or orthopnea. She's not describing any palpitations. She has some mild orthostatic symptoms. She did have an episode where she went down to the ground but says she did not lose consciousness. This was about a week ago. She's not having any chest pressure, neck or arm discomfort. She's not had any edema and in fact this is improved.   Allergies  Allergen Reactions  . Lactose Intolerance (Gi) Nausea And Vomiting    severe stomach pain  . Penicillins Hives and Itching    "haven't used any since 1970's"  . Sulfa Antibiotics Hives and Itching    "haven't used any since 1970's"    Current Outpatient Prescriptions  Medication Sig Dispense Refill  . acetaminophen (TYLENOL) 325 MG tablet Take 2 tablets (650 mg total) by mouth every 6 (six) hours as needed for fever.      . ALPRAZolam (XANAX) 1 MG tablet Take 0.5 tablets (0.5 mg total) by mouth 4 (four) times daily as needed for anxiety. For sleep or anxiety.  8 tablet  0  . ciprofloxacin (CIPRO) 500 MG tablet Take 1 tablet (500 mg total) by mouth 2 (two) times daily.  14 tablet  0  . digoxin (LANOXIN) 0.125 MG tablet Take 62.5-125 mcg by mouth daily. Take on Tuesdays, Thursdays, Saturdays, and Sundays.  Take 62. on all other days.      Marland Kitchen diltiazem (CARDIZEM CD) 120 MG 24 hr capsule Take 1 capsule (120 mg total) by mouth daily.  90 capsule  3  . feeding supplement (ENSURE COMPLETE) LIQD Take 237 mLs by mouth 2  (two) times daily between meals.      . furosemide (LASIX) 40 MG tablet Take 40 mg by mouth 3 (three) times a week. Take 1 tablet on Mondays, Wednesdays, and Saturdays.      Marland Kitchen glimepiride (AMARYL) 4 MG tablet Take 4 mg by mouth 2 (two) times daily.      . metoprolol tartrate (LOPRESSOR) 25 MG tablet Take 1 tablet (25 mg total) by mouth 2 (two) times daily.      . Multiple Vitamin (MULTIVITAMIN WITH MINERALS) TABS Take 1 tablet by mouth daily.      Marland Kitchen OLANZapine (ZYPREXA) 5 MG tablet Take 5 mg by mouth at bedtime.      . sertraline (ZOLOFT) 50 MG tablet Take 50 mg by mouth every evening.      . warfarin (COUMADIN) 5 MG tablet Take as directed by coumadin clinic  30 tablet  3  . atorvastatin (LIPITOR) 20 MG tablet Take 1 tablet (20 mg total) by mouth daily at 6 PM.  90 tablet  3  . latanoprost (XALATAN) 0.005 % ophthalmic solution Place 1 drop into both eyes at bedtime.      . methylphenidate (RITALIN) 5 MG tablet Take 5 mg by mouth 2 (two) times daily.      Marland Kitchen omeprazole (PRILOSEC) 20 MG capsule Take 20 mg by mouth 2 (two) times daily.  Past Medical History  Diagnosis Date  . Pancreatitis     ~2008 and again in 11/2011 s/p ERCP  . Hypertension   . Glaucoma(365)   . High cholesterol   . GERD (gastroesophageal reflux disease)   . H/O hiatal hernia   . Atrial fibrillation   . Dizziness - light-headed   . Pneumonia 12/2011    "first time I know about"  . Shortness of breath on exertion   . Type II diabetes mellitus   . Depression   . Chronic lower back pain     Past Surgical History  Procedure Date  . Ercp 11/13/2011    Procedure: ENDOSCOPIC RETROGRADE CHOLANGIOPANCREATOGRAPHY (ERCP);  Surgeon: Theda Belfast, MD;  Location: [facility] ENDOSCOPY;  Service: Endoscopy;  Laterality: N/A;  . Tonsillectomy and adenoidectomy ~ 1939  . Cholecystectomy 1990's  . Appendectomy ~ 1960  . Vaginal hysterectomy ~ 1960's  . Dilation and curettage of uterus   . Cataract extraction w/ intraocular lens   implant, bilateral 2000's    ROS:  As stated in the HPI and negative for all other systems.   PHYSICAL EXAM BP 115/70  Pulse 79  Ht 5' (1.524 m)  Wt 120 lb (54.432 kg)  BMI 23.44 kg/m2 PHYSICAL EXAM GENERAL:  Frail appearing HEENT:  Pupils equal round and reactive, fundi not visualized, oral mucosa unremarkable NECK:  No jugular venous distention, waveform within normal limits, carotid upstroke brisk and symmetric, no bruits, no thyromegaly LYMPHATICS:  No cervical, inguinal adenopathy LUNGS:  Clear to auscultation bilaterally BACK:  No CVA tenderness CHEST:  Unremarkable HEART:  PMI not displaced or sustained,S1 and S2 within normal limits, no S3, no clicks, no rubs, no murmurs, irregular ABD:  Flat, positive bowel sounds normal in frequency in pitch, no bruits, no rebound, no guarding, no midline pulsatile mass, no hepatomegaly, no splenomegaly EXT:  2 plus pulses throughout, no edema, no cyanosis no clubbing SKIN:  No rashes no nodules NEURO:  Cranial nerves II through XII grossly intact, motor grossly intact throughout PSYCH:  Cognitively intact, oriented to person place and time, very weak  EKG:  Atrial fibrillation, rate 81, low-voltage limb leads, axis within normal limits, QTC prolonged nonspecific diffuse T wave changes.  08/05/12   ASSESSMENT AND PLAN   Atrial fibrillation - The patient  tolerates this rhythm and rate control and anticoagulation. I will discontinue the digoxin in an effort to reduce her polypharmacy.  HTN (hypertension) -  The blood pressure is at target. No change in medications is indicated. We will continue with therapeutic lifestyle changes (TLC).   Mitral regurgitation -  I will schedule follow up in February  Edema -  This is improved. She will continue the medications as listed.  Weakness  - She reports she had labs drawn yesterday apparently to include thyroid and CBC. I think it's reasonable to try her off of atorvastatin completely for  a couple of months as her husband seem to correlate some of her worsening weakness with a change to this medication.

## 2012-09-22 NOTE — Patient Instructions (Addendum)
Please stop your Digoxin Hold atorvastatin for 2 weeks.  Resume if no improvement. Continue all other medications as listed.  Follow up in 1 year with Dr Antoine Poche.  You will receive a letter in the mail 2 months before you are due.  Please call us when you receive this letter to schedule your follow up appointment.

## 2012-11-28 DIAGNOSIS — G4700 Insomnia, unspecified: Secondary | ICD-10-CM | POA: Insufficient documentation

## 2013-01-17 ENCOUNTER — Encounter: Payer: Self-pay | Admitting: Internal Medicine

## 2013-03-02 ENCOUNTER — Other Ambulatory Visit: Payer: Self-pay

## 2013-03-02 MED ORDER — METOPROLOL TARTRATE 25 MG PO TABS: 25.0000 mg | ORAL_TABLET | Freq: Two times a day (BID) | ORAL | Status: DC

## 2013-03-02 NOTE — Telephone Encounter (Signed)
..   Requested Prescriptions   Pending Prescriptions Disp Refills  . metoprolol tartrate (LOPRESSOR) 25 MG tablet 60 tablet 1    Sig: Take 1 tablet (25 mg total) by mouth 2 (two) times daily.

## 2013-05-01 ENCOUNTER — Other Ambulatory Visit: Payer: Self-pay | Admitting: *Deleted

## 2013-05-01 MED ORDER — METOPROLOL TARTRATE 25 MG PO TABS: 25.0000 mg | ORAL_TABLET | Freq: Two times a day (BID) | ORAL | Status: DC

## 2013-05-24 ENCOUNTER — Other Ambulatory Visit: Payer: Self-pay

## 2013-05-24 MED ORDER — DILTIAZEM HCL ER COATED BEADS 120 MG PO CP24: 120.0000 mg | ORAL_CAPSULE | Freq: Every day | ORAL | Status: DC

## 2013-07-27 ENCOUNTER — Ambulatory Visit (HOSPITAL_COMMUNITY)
Admission: RE | Admit: 2013-07-27 | Discharge: 2013-07-27 | Disposition: A | Discharge status: Home / Self Care | Payer: Medicare Other | Source: Ambulatory Visit | Attending: Internal Medicine | Admitting: Internal Medicine

## 2013-07-27 ENCOUNTER — Encounter (HOSPITAL_COMMUNITY): Payer: Self-pay

## 2013-07-27 ENCOUNTER — Other Ambulatory Visit (HOSPITAL_COMMUNITY): Payer: Self-pay | Admitting: Internal Medicine

## 2013-07-27 DIAGNOSIS — D649 Anemia, unspecified: Secondary | ICD-10-CM | POA: Insufficient documentation

## 2013-07-27 HISTORY — DX: Chronic obstructive pulmonary disease, unspecified: J44.9

## 2013-07-27 MED ORDER — FERUMOXYTOL INJECTION 510 MG/17 ML
510.0000 mg | Freq: Once | INTRAVENOUS | Status: AC
  Administered 2013-07-27: 510 mg via INTRAVENOUS
  Filled 2013-07-27: qty 17

## 2013-07-27 MED ORDER — SODIUM CHLORIDE 0.9 % IV SOLN
Freq: Once | INTRAVENOUS | Status: AC
  Administered 2013-07-27: 09:00:00 via INTRAVENOUS

## 2013-07-27 NOTE — Progress Notes (Signed)
Unevenfult infusion of #1/2 FERAHEME. Next appt is for 08/03/13

## 2013-08-02 ENCOUNTER — Other Ambulatory Visit: Payer: Self-pay

## 2013-08-02 MED ORDER — METOPROLOL TARTRATE 25 MG PO TABS: 25.0000 mg | ORAL_TABLET | Freq: Two times a day (BID) | ORAL | Status: DC

## 2013-08-03 ENCOUNTER — Encounter (HOSPITAL_COMMUNITY): Payer: Self-pay

## 2013-08-03 ENCOUNTER — Ambulatory Visit (HOSPITAL_COMMUNITY)
Admission: RE | Admit: 2013-08-03 | Discharge: 2013-08-03 | Disposition: A | Discharge status: Home / Self Care | Payer: Medicare Other | Source: Ambulatory Visit | Attending: Internal Medicine | Admitting: Internal Medicine

## 2013-08-03 MED ORDER — SODIUM CHLORIDE 0.9 % IV SOLN
Freq: Once | INTRAVENOUS | Status: AC
  Administered 2013-08-03: 10:00:00 via INTRAVENOUS

## 2013-08-03 MED ORDER — FERUMOXYTOL INJECTION 510 MG/17 ML
510.0000 mg | Freq: Once | INTRAVENOUS | Status: AC
  Administered 2013-08-03: 510 mg via INTRAVENOUS
  Filled 2013-08-03: qty 17

## 2013-08-08 ENCOUNTER — Encounter: Payer: Self-pay | Admitting: Internal Medicine

## 2013-08-16 ENCOUNTER — Encounter: Payer: Self-pay | Admitting: Internal Medicine

## 2013-08-26 ENCOUNTER — Other Ambulatory Visit: Payer: Self-pay | Admitting: Cardiology

## 2013-08-28 ENCOUNTER — Other Ambulatory Visit: Payer: Self-pay

## 2013-08-28 MED ORDER — METOPROLOL TARTRATE 25 MG PO TABS: 25.0000 mg | ORAL_TABLET | Freq: Two times a day (BID) | ORAL | Status: DC

## 2013-09-19 ENCOUNTER — Ambulatory Visit (INDEPENDENT_AMBULATORY_CARE_PROVIDER_SITE_OTHER)
Admission: RE | Admit: 2013-09-19 | Discharge: 2013-09-19 | Disposition: A | Discharge status: Home / Self Care | Payer: Medicare Other | Source: Ambulatory Visit | Attending: Internal Medicine | Admitting: Internal Medicine

## 2013-09-19 ENCOUNTER — Ambulatory Visit (INDEPENDENT_AMBULATORY_CARE_PROVIDER_SITE_OTHER): Payer: Medicare Other | Admitting: Internal Medicine

## 2013-09-19 ENCOUNTER — Encounter: Payer: Self-pay | Admitting: Internal Medicine

## 2013-09-19 VITALS — BP 138/84 | HR 84 | Ht 60.0 in | Wt 152.0 lb

## 2013-09-19 DIAGNOSIS — R197 Diarrhea, unspecified: Secondary | ICD-10-CM

## 2013-09-19 DIAGNOSIS — K529 Noninfective gastroenteritis and colitis, unspecified: Secondary | ICD-10-CM

## 2013-09-19 DIAGNOSIS — R159 Full incontinence of feces: Secondary | ICD-10-CM

## 2013-09-19 NOTE — Patient Instructions (Addendum)
Your physician has requested that you go to the basement for the following lab work before leaving today: Stool for C. Diff.  Please go to the x-ray department after leaving the lab in the basement. This is also located in the basement.   We will contact you with the results.   I appreciate the opportunity to care for you.

## 2013-09-19 NOTE — Progress Notes (Signed)
Subjective:    Patient ID: [name] [name], female    DOB: 06-24-1930, 79 y.o.   MRN: [id number]  HPI Patient is a pleasant elderly white woman here with her husband. She was on the list for possible routine repeat colonoscopy. She is describing loose stools for several years, last colonoscopy was negative in 2004 I should say. In the past 3 months has worsened and she is having oozing of stool in the underwear or depends. She has not been impacted that we are aware. She's been in and out of the hospital on multiple occasions in 2014 with pancreatitis, systemic inflammatory response syndrome, pneumonia, urinary tract infection. She is on warfarin for atrial fibrillation. She has become progressively weaker in her lower extremities, with painful sensation in the feet and legs. Exact workup and treatment not entirely clear, she does have diabetes. She has been told there is not much more that can be done with regards to these problems.  Allergies  Allergen Reactions  . Lactose Intolerance (Gi) Nausea And Vomiting    severe stomach pain  . Penicillins Hives and Itching    "haven't used any since 1970's"  . Sulfa Antibiotics Hives and Itching    "haven't used any since 1970's"  . Ciprocinonide [Fluocinolone]     Just became so sick when taking this per husband statement   Outpatient Prescriptions Prior to Visit  Medication Sig Dispense Refill  . albuterol (ACCUNEB) 0.63 MG/3ML nebulizer solution Take 1 ampule by nebulization 2 (two) times daily.      Marland Kitchen ALPRAZolam (XANAX) 1 MG tablet Take 0.5 tablets (0.5 mg total) by mouth 4 (four) times daily as needed for anxiety. For sleep or anxiety.  8 tablet  0  . diltiazem (CARDIZEM CD) 120 MG 24 hr capsule Take 1 capsule (120 mg total) by mouth daily.  90 capsule  3  . glimepiride (AMARYL) 4 MG tablet Take 4 mg by mouth 2 (two) times daily.      Marland Kitchen latanoprost (XALATAN) 0.005 % ophthalmic solution Place 1 drop into both eyes at bedtime.      .  methylphenidate (RITALIN) 5 MG tablet Take 5 mg by mouth 2 (two) times daily.      . metoprolol tartrate (LOPRESSOR) 25 MG tablet TAKE 1 TABLET BY MOUTH TWICE DAILY  60 tablet  0  . Multiple Vitamin (MULTIVITAMIN WITH MINERALS) TABS Take 1 tablet by mouth daily.      Marland Kitchen omeprazole (PRILOSEC) 20 MG capsule Take 20 mg by mouth 2 (two) times daily.       . sertraline (ZOLOFT) 50 MG tablet Take 50 mg by mouth every evening.      . warfarin (COUMADIN) 5 MG tablet Take as directed by coumadin clinic  30 tablet  3  . atorvastatin (LIPITOR) 20 MG tablet Take 1 tablet (20 mg total) by mouth daily at 6 PM.  90 tablet  3  . ciprofloxacin (CIPRO) 500 MG tablet Take 1 tablet (500 mg total) by mouth 2 (two) times daily.  14 tablet  0  . feeding supplement (ENSURE COMPLETE) LIQD Take 237 mLs by mouth 2 (two) times daily between meals.      . furosemide (LASIX) 40 MG tablet Take 40 mg by mouth 3 (three) times a week. Take 1 tablet on Mondays, Wednesdays, and Saturdays.      Marland Kitchen OLANZapine (ZYPREXA) 5 MG tablet Take 5 mg by mouth at bedtime.  No facility-administered medications prior to visit.   Past Medical History  Diagnosis Date  . Pancreatitis     ~2008 and again in 11/2011 s/p ERCP  . Hypertension   . Glaucoma   . High cholesterol   . GERD (gastroesophageal reflux disease)   . H/O hiatal hernia   . Atrial fibrillation   . Dizziness - light-headed   . Pneumonia 12/2011    "first time I know about"  . Type II diabetes mellitus   . Depression   . Chronic lower back pain   . COPD (chronic obstructive pulmonary disease)   . HCAP (healthcare-associated pneumonia) 11/21/2011  . Mitral regurgitation 03/11/2012  . Atrial fibrillation, rapid     Coumadin   . Chronic diarrhea 04/12/2012  . Metabolic encephalopathy 04/12/2012  . E-coli UTI 11/13/2011  . SIRS (systemic inflammatory response syndrome) 03/13/2012   Past Surgical History  Procedure Laterality Date  . Ercp  11/13/2011    Procedure: ENDOSCOPIC  RETROGRADE CHOLANGIOPANCREATOGRAPHY (ERCP);  Surgeon: Theda Belfast, MD;  Location: [facility] ENDOSCOPY;  Service: Endoscopy;  Laterality: N/A;  . Tonsillectomy and adenoidectomy  ~ 1939  . Cholecystectomy  1990's  . Appendectomy  ~ 1960  . Vaginal hysterectomy  ~ 1960's  . Dilation and curettage of uterus    . Cataract extraction w/ intraocular lens  implant, bilateral  2000's  . Colonoscopy w/ biopsies     History   Social History  . Marital Status: Married    Spouse Name: N/A    Number of Children: N/A  . Years of Education: N/A   Social History Main Topics  . Smoking status: Never Smoker   . Smokeless tobacco: Never Used  . Alcohol Use: No  . Drug Use: No  . Sexual Activity: Not Currently   Other Topics Concern  . None   Social History Narrative   Lives at home with her husband.   No children   Family History  Problem Relation Age of Onset  . Anesthesia problems Neg Hx   . Hypotension Neg Hx   . Malignant hyperthermia Neg Hx   . Pseudochol deficiency Neg Hx   . Stroke Father 38  . Stroke Mother 64     Review of Systems Ambulates with a walker. As per history of present illness. She's had night sweats dyspnea on exertion. There's back pain as well. Some intermittent lower abdominal cramps. She is not sleeping well.    Objective:   Physical Exam General:  Frail, using walker Eyes:   anicteric Abdomen:  soft and nontender, BS+  Rectal: Anoderm inspection was normal Digital exam revealed normal resting tone and voluntary squeeze. No impaction, scanty stool, tan-brown No mass or rectocele present. Simulated defecation with valsalva revealed appropriate abdominal contraction and descent.    Ext:   no edema    Data Reviewed:  Hospital records cardiology notes 2004 colonoscopy       Assessment & Plan:   1. Chronic diarrhea   2. Diarrhea   3. Fecal incontinence

## 2013-09-19 NOTE — Assessment & Plan Note (Signed)
Does not seem to be impacted - anal fx intact Check C diff and KUB

## 2013-09-19 NOTE — Assessment & Plan Note (Addendum)
Loose-watery stools Worsening Check C diff, KUB Consider bile acid sequestrants, loperamide Do not think she has pancreatic insufficency, certainly could have IBS postcholecystectomy loose stools. Medications possible.

## 2013-09-20 ENCOUNTER — Other Ambulatory Visit: Payer: Medicare Other

## 2013-09-20 DIAGNOSIS — R197 Diarrhea, unspecified: Secondary | ICD-10-CM

## 2013-09-20 NOTE — Progress Notes (Signed)
Quick Note:  Xray ok Await C diff toxin ______

## 2013-09-21 ENCOUNTER — Encounter: Payer: Self-pay | Admitting: Internal Medicine

## 2013-09-21 ENCOUNTER — Other Ambulatory Visit: Payer: Self-pay

## 2013-09-21 ENCOUNTER — Telehealth: Payer: Self-pay | Admitting: Internal Medicine

## 2013-09-21 DIAGNOSIS — A0472 Enterocolitis due to Clostridium difficile, not specified as recurrent: Secondary | ICD-10-CM | POA: Insufficient documentation

## 2013-09-21 LAB — CLOSTRIDIUM DIFFICILE BY PCR: Toxigenic C. Difficile by PCR: DETECTED — CR

## 2013-09-21 MED ORDER — SACCHAROMYCES BOULARDII 250 MG PO CAPS: 250.0000 mg | ORAL_CAPSULE | Freq: Two times a day (BID) | ORAL | Status: DC

## 2013-09-21 MED ORDER — METRONIDAZOLE 250 MG PO TABS: 250.0000 mg | ORAL_TABLET | Freq: Three times a day (TID) | ORAL | Status: DC

## 2013-09-21 NOTE — Telephone Encounter (Signed)
Dr. Leone Payor aware.  Results reviewed and treatment initiated.

## 2013-09-21 NOTE — Progress Notes (Signed)
Quick Note:  Please call her/husband She has C diff  1) Metronidazole 500 mg tid x 10 days 2) Florastor 250 mg bid x 1 month 3) REV f/u APP in 2-3 weeks  4) If not getting better in 4-5 days on metronidazole should call back and we will change to vancomycin 5) stay hydrated, low residue diet, etc ______

## 2013-09-25 ENCOUNTER — Ambulatory Visit: Payer: Medicare Other | Admitting: Podiatry

## 2013-09-25 ENCOUNTER — Telehealth: Payer: Self-pay | Admitting: Internal Medicine

## 2013-09-25 MED ORDER — VANCOMYCIN HCL 250 MG PO CAPS: 250.0000 mg | ORAL_CAPSULE | Freq: Four times a day (QID) | ORAL | Status: DC

## 2013-09-25 NOTE — Telephone Encounter (Signed)
Spoke to patient's husband and informed him per Mike Gip PA-C to stop the flagyl and start Vancomycin 250 mg (QID x 14 days).  This has been phoned into [facility].  She is also to continue the florastor twice a day per [employer], PA-C.  We are unable to call in the liquid Vancomycin as it is on back order so hopefully her insurance will cover the pills.  [place] can't tell if it will run through until husband comes with [employer] cards.

## 2013-09-25 NOTE — Telephone Encounter (Signed)
Patient c/o continued diarrhea. No real improvement on flagyl 500 TID for c-diff since last week.  Still having 5-6 bms a day.  Per Dr. Leone Payor patient to call back for vancomycin.  Amy Esterwood PA please advise the correct dosage.  Please route to Patti [place] to follow up.

## 2013-09-25 NOTE — Telephone Encounter (Signed)
Spoke to husband and he picked up 1/2 the rx of vancomycin (almost $500.00) to see if that helps.  He will call us back with an update.  I told him that he can keep trying to call [facility] to see if the liquid vancomycin is off back order. Checked cost of dificid at [facility] for them and it was going to be $1300.00.  They are in the do-nut hole husband reports.  Per Mike Gip PA-C this will be routed to Dr. Stan Head to keep him aware .

## 2013-09-26 ENCOUNTER — Telehealth: Payer: Self-pay | Admitting: Internal Medicine

## 2013-09-26 DIAGNOSIS — K921 Melena: Secondary | ICD-10-CM

## 2013-09-26 NOTE — Addendum Note (Signed)
Addended by: Daphine Deutscher on: 09/26/2013 04:40 PM   Modules accepted: Orders

## 2013-09-26 NOTE — Telephone Encounter (Signed)
Lab in [facility]. Spoke with patient and her husband. She will come for labs tomorrow.

## 2013-09-26 NOTE — Telephone Encounter (Signed)
Please have pt come in for a cbc in am-hopefully just antibiotics making her stools change color

## 2013-09-26 NOTE — Telephone Encounter (Signed)
Spoke with patient's husband and patient also. Patient states she got to thinking last night and remembered that her bowel movements have had coffee ground like particles in it for the last several weeks. States she has floaters and sinkers in her stool and the sinkers are coffee ground like. States she noticed this after she started taking the first antibiotics. Please, advise.

## 2013-09-27 ENCOUNTER — Other Ambulatory Visit (INDEPENDENT_AMBULATORY_CARE_PROVIDER_SITE_OTHER): Payer: Medicare Other

## 2013-09-27 DIAGNOSIS — R195 Other fecal abnormalities: Secondary | ICD-10-CM

## 2013-09-27 DIAGNOSIS — K921 Melena: Secondary | ICD-10-CM

## 2013-09-27 LAB — CBC WITH DIFFERENTIAL/PLATELET
Basophils Absolute: 0.1 10*3/uL (ref 0.0–0.1)
Basophils Relative: 0.5 % (ref 0.0–3.0)
Eosinophils Relative: 1.7 % (ref 0.0–5.0)
HCT: 44.2 % (ref 36.0–46.0)
Hemoglobin: 14.7 g/dL (ref 12.0–15.0)
Lymphs Abs: 2.2 10*3/uL (ref 0.7–4.0)
MCHC: 33.3 g/dL (ref 30.0–36.0)
MCV: 84.2 fl (ref 78.0–100.0)
Monocytes Absolute: 0.5 10*3/uL (ref 0.1–1.0)
Monocytes Relative: 4.3 % (ref 3.0–12.0)
Neutro Abs: 8.6 10*3/uL — ABNORMAL HIGH (ref 1.4–7.7)
Platelets: 258 10*3/uL (ref 150.0–400.0)
RBC: 5.25 Mil/uL — ABNORMAL HIGH (ref 3.87–5.11)
WBC: 11.6 10*3/uL — ABNORMAL HIGH (ref 4.5–10.5)

## 2013-10-03 ENCOUNTER — Ambulatory Visit: Payer: Medicare Other | Admitting: Cardiology

## 2013-10-09 ENCOUNTER — Encounter: Payer: Self-pay | Admitting: Physician Assistant

## 2013-10-09 ENCOUNTER — Ambulatory Visit (INDEPENDENT_AMBULATORY_CARE_PROVIDER_SITE_OTHER): Payer: Medicare Other | Admitting: Podiatry

## 2013-10-09 ENCOUNTER — Encounter: Payer: Self-pay | Admitting: Podiatry

## 2013-10-09 ENCOUNTER — Ambulatory Visit (INDEPENDENT_AMBULATORY_CARE_PROVIDER_SITE_OTHER): Payer: Medicare Other | Admitting: Physician Assistant

## 2013-10-09 VITALS — BP 110/62 | HR 74 | Ht 60.0 in | Wt 151.8 lb

## 2013-10-09 VITALS — BP 151/70 | HR 62 | Resp 12 | Ht 60.0 in | Wt 151.0 lb

## 2013-10-09 DIAGNOSIS — M79609 Pain in unspecified limb: Secondary | ICD-10-CM

## 2013-10-09 DIAGNOSIS — A0472 Enterocolitis due to Clostridium difficile, not specified as recurrent: Secondary | ICD-10-CM

## 2013-10-09 DIAGNOSIS — B351 Tinea unguium: Secondary | ICD-10-CM

## 2013-10-09 NOTE — Progress Notes (Signed)
   Subjective:    Patient ID: [name] [name], female    DOB: 16-Dec-1929, 80 y.o.   MRN: [id number]  HPI Comments: ''TRIM MY TOENAILS.''  Patient presents for the initial visit to our office complaining of painful toenails and requesting debridement      Review of Systems  Constitutional: Positive for fatigue.  Respiratory: Positive for shortness of breath.   Gastrointestinal: Positive for nausea, abdominal pain and diarrhea.  Genitourinary: Positive for frequency.  Musculoskeletal: Positive for gait problem.  Neurological: Positive for light-headedness.  All other systems reviewed and are negative.       Objective:   Physical Exam  Orientated times x3  female  Vascular: The left DP is zero over four right DP is two over four. PTs are two over four bilaterally.  Neurological: Sensation to 10 g monofilament wire intact 7/10 locations right and 10 over 10 locations left. Vibratory sensation intact bilaterally.  Dermatological: Atrophic skin without any hair growth noted bilaterally. Elongated, hypertrophic, discolored toenails x10.  Musculoskeletal: Low arch contour noted bilaterally without any restrictions of the ankle, subtalar, midtarsal joints bilaterally          Assessment & Plan:   Assessment: Mild diabetic peripheral neuropathy Symptomatic onychomycoses x10  Plan: All 10 toenails are debrided without a bleeding. Reappoint at three-month intervals. Patient is given instructions for diabetic foot care.Marland Kitchen

## 2013-10-09 NOTE — Patient Instructions (Signed)
Diabetes and Foot Care Diabetes may cause you to have problems because of poor blood supply (circulation) to your feet and legs. This may cause the skin on your feet to become thinner, break easier, and heal more slowly. Your skin may become dry, and the skin may peel and crack. You may also have nerve damage in your legs and feet causing decreased feeling in them. You may not notice minor injuries to your feet that could lead to infections or more serious problems. Taking care of your feet is one of the most important things you can do for yourself.  HOME CARE INSTRUCTIONS  Wear shoes at all times, even in the house. Do not go barefoot. Bare feet are easily injured.  Check your feet daily for blisters, cuts, and redness. If you cannot see the bottom of your feet, use a mirror or ask someone for help.  Wash your feet with warm water (do not use hot water) and mild soap. Then pat your feet and the areas between your toes until they are completely dry. Do not soak your feet as this can dry your skin.  Apply a moisturizing lotion or petroleum jelly (that does not contain alcohol and is unscented) to the skin on your feet and to dry, brittle toenails. Do not apply lotion between your toes.  Trim your toenails straight across. Do not dig under them or around the cuticle. File the edges of your nails with an emery board or nail file.  Do not cut corns or calluses or try to remove them with medicine.  Wear clean socks or stockings every day. Make sure they are not too tight. Do not wear knee-high stockings since they may decrease blood flow to your legs.  Wear shoes that fit properly and have enough cushioning. To break in new shoes, wear them for just a few hours a day. This prevents you from injuring your feet. Always look in your shoes before you put them on to be sure there are no objects inside.  Do not cross your legs. This may decrease the blood flow to your feet.  If you find a minor scrape,  cut, or break in the skin on your feet, keep it and the skin around it clean and dry. These areas may be cleansed with mild soap and water. Do not cleanse the area with peroxide, alcohol, or iodine.  When you remove an adhesive bandage, be sure not to damage the skin around it.  If you have a wound, look at it several times a day to make sure it is healing.  Do not use heating pads or hot water bottles. They may burn your skin. If you have lost feeling in your feet or legs, you may not know it is happening until it is too late.  Make sure your health care provider performs a complete foot exam at least annually or more often if you have foot problems. Report any cuts, sores, or bruises to your health care provider immediately. SEEK MEDICAL CARE IF:   You have an injury that is not healing.  You have cuts or breaks in the skin.  You have an ingrown nail.  You notice redness on your legs or feet.  You feel burning or tingling in your legs or feet.  You have pain or cramps in your legs and feet.  Your legs or feet are numb.  Your feet always feel cold. SEEK IMMEDIATE MEDICAL CARE IF:   There is increasing redness,   swelling, or pain in or around a wound.  There is a red line that goes up your leg.  Pus is coming from a wound.  You develop a fever or as directed by your health care provider.  You notice a bad smell coming from an ulcer or wound. Document Released: 09/18/2000 Document Revised: 05/24/2013 Document Reviewed: 02/28/2013 Exit[employer] Patient Information 2014 [employer].  

## 2013-10-09 NOTE — Progress Notes (Signed)
Subjective:    Patient ID: [name] [name], female    DOB: January 25, 1930, 80 y.o.   MRN: [id number]  HPI [name] [name] is a very nice 80 year old white female known to Dr. Carlean Purl who was seen on 09/19/2013 with complaints of chronic loose stools with exacerbation of diarrhea . She had stool studies done and C. difficile PCR return positive. CBC was subsequently done showing a WBC of 11.6, hemoglobin 14.7 hematocrit 44.2. She was initially started on a course of metronidazole and Florastor that her husband called back about a week later stating that she was continuing to have significant diarrhea and she was switched to oral vancomycin 250 mg 4 times daily. She  comes back in today for followup. Her husband states that the vancomycin cost one thousand dollars out of pocket but she is doing much better. She has no complaints of abdominal pain or cramping. Her appetite is good ,her weight is stable ,she has no complaints of nausea. She says she still feels fatigued but otherwise is much better. She said that  At her worst  she was having at least 8-9 liquid loose bowel movements per day and now she is probably having 2 bowel movements per day which are still very soft, but that is about baseline for her. She's on her last day of vancomycin and has 2 more weeks of Florastor at  home.   Review of Systems  Constitutional: Positive for fatigue.  HENT: Negative.   Eyes: Negative.   Respiratory: Negative.   Cardiovascular: Negative.   Gastrointestinal: Positive for diarrhea.  Endocrine: Negative.   Genitourinary: Negative.   Musculoskeletal: Negative.   Skin: Negative.   Allergic/Immunologic: Negative.   Neurological: Negative.   Hematological: Negative.   Psychiatric/Behavioral: Negative.    Outpatient Prescriptions Prior to Visit  Medication Sig Dispense Refill  . albuterol (ACCUNEB) 0.63 MG/3ML nebulizer solution Take 1 ampule by nebulization 2 (two) times daily.      Marland Kitchen ALPRAZolam (XANAX) 1 MG  tablet Take 0.5 tablets (0.5 mg total) by mouth 4 (four) times daily as needed for anxiety. For sleep or anxiety.  8 tablet  0  . diltiazem (CARDIZEM CD) 120 MG 24 hr capsule Take 1 capsule (120 mg total) by mouth daily.  90 capsule  3  . glimepiride (AMARYL) 4 MG tablet Take 4 mg by mouth 2 (two) times daily.      Marland Kitchen HYDROcodone-acetaminophen (VICODIN) 5-500 MG per tablet Take 1 tablet by mouth every 6 (six) hours as needed for pain.      Marland Kitchen latanoprost (XALATAN) 0.005 % ophthalmic solution Place 1 drop into both eyes at bedtime.      . methylphenidate (RITALIN) 5 MG tablet Take 5 mg by mouth 2 (two) times daily.      . metoprolol tartrate (LOPRESSOR) 25 MG tablet TAKE 1 TABLET BY MOUTH TWICE DAILY  60 tablet  0  . Multiple Vitamin (MULTIVITAMIN WITH MINERALS) TABS Take 1 tablet by mouth daily.      Marland Kitchen omeprazole (PRILOSEC) 20 MG capsule Take 20 mg by mouth 2 (two) times daily.       Marland Kitchen saccharomyces boulardii (FLORASTOR) 250 MG capsule Take 1 capsule (250 mg total) by mouth 2 (two) times daily.  60 capsule  0  . sertraline (ZOLOFT) 50 MG tablet Take 50 mg by mouth every evening.      . vancomycin (VANCOCIN) 250 MG capsule Take 1 capsule (250 mg total) by mouth 4 (four) times daily.  56 capsule  0  . warfarin (COUMADIN) 5 MG tablet Take as directed by coumadin clinic  30 tablet  3  . zolpidem (AMBIEN) 5 MG tablet Take 5 mg by mouth at bedtime as needed for sleep.       No facility-administered medications prior to visit.   Allergies  Allergen Reactions  . Lactose Intolerance (Gi) Nausea And Vomiting    severe stomach pain  . Penicillins Hives and Itching    "haven't used any since 1970's"  . Sulfa Antibiotics Hives and Itching    "haven't used any since 1970's"  . Ciprocinonide [Fluocinolone]     Just became so sick when taking this per husband statement   Patient Active Problem List   Diagnosis Date Noted  . C. difficile colitis 09/21/2013  . Fecal incontinence 09/19/2013  . Irritable  bowel disease 04/12/2012  . Chronic diarrhea 04/12/2012  . Encounter for long-term (current) use of anticoagulants 03/17/2012  . Depression 03/13/2012  . Anxiety 03/13/2012  . Mitral regurgitation 03/11/2012  . Chronic epigastric pain 01/25/2012  . Atrial fibrillation, rapid   . DM II (diabetes mellitus, type II), controlled 11/13/2011  . HTN (hypertension) 11/13/2011  . Hypercholesteremia 11/13/2011  . Glaucoma (increased eye pressure) 11/13/2011   History  Substance Use Topics  . Smoking status: Never Smoker   . Smokeless tobacco: Never Used  . Alcohol Use: No   family history includes Stroke (age of onset: 79) in her mother; Stroke (age of onset: 12) in her father. There is no history of Anesthesia problems, Hypotension, Malignant hyperthermia, or Pseudochol deficiency.     Objective:   Physical Exam well-developed elderly white female in no acute distress, accompanied by her husband blood pressure 110/62 pulse 74 height 5 foot weight 151. HEENT; nontraumatic normocephalic EOMI PERRLA sclera anicteric, Supple ;no JVD, Cardiovascular; regular rate and rhythm with S1-S2 no murmur or gallop, Pulmonary; clear bilaterally, Abdomen; soft nontender nondistended bowel sounds are active there is no palpable mass or hepatosplenomegaly, Rectal; exam not done, Extremities ;no clubbing cyanosis or edema skin warm and dry, Psych; mood and affect appropriate        Assessment & Plan:  #10   80 year old female with C. difficile colitis-much improved after a 14 day course of vancomycin. #2 chronic loose stools/IBS #3 atrial fibrillation #4 chronic anticoagulation #5 diabetes mellitus  Plan; complete last doses of vancomycin this evening and then observe. Patient and her husband are cautioned that they should call for any increase in diarrhea or associated abdominal pain cramping nausea etc. over the next couple of weeks and at that point would need a long tapered course of vancomycin. Finish   florastor x2 more weeks [employer] as tolerated She will followup with Dr. Carlean Purl as needed

## 2013-10-09 NOTE — Patient Instructions (Signed)
Finish the [employer] ( probiotic) and the vancomycin ( antibiotic).  Call for problems with worsening diarrhea.

## 2013-10-10 ENCOUNTER — Encounter: Payer: Self-pay | Admitting: Podiatry

## 2013-10-12 ENCOUNTER — Encounter: Payer: Self-pay | Admitting: Physician Assistant

## 2013-10-12 ENCOUNTER — Ambulatory Visit (INDEPENDENT_AMBULATORY_CARE_PROVIDER_SITE_OTHER): Payer: Medicare Other | Admitting: Physician Assistant

## 2013-10-12 VITALS — BP 130/64 | HR 87 | Ht 60.0 in | Wt 150.8 lb

## 2013-10-12 DIAGNOSIS — I10 Essential (primary) hypertension: Secondary | ICD-10-CM

## 2013-10-12 DIAGNOSIS — I4891 Unspecified atrial fibrillation: Secondary | ICD-10-CM

## 2013-10-12 DIAGNOSIS — R0602 Shortness of breath: Secondary | ICD-10-CM

## 2013-10-12 DIAGNOSIS — I340 Nonrheumatic mitral (valve) insufficiency: Secondary | ICD-10-CM

## 2013-10-12 DIAGNOSIS — I059 Rheumatic mitral valve disease, unspecified: Secondary | ICD-10-CM

## 2013-10-12 DIAGNOSIS — I6529 Occlusion and stenosis of unspecified carotid artery: Secondary | ICD-10-CM

## 2013-10-12 LAB — BASIC METABOLIC PANEL
BUN: 20 mg/dL (ref 6–23)
CHLORIDE: 108 meq/L (ref 96–112)
CO2: 22 meq/L (ref 19–32)
Calcium: 9.4 mg/dL (ref 8.4–10.5)
Creatinine, Ser: 1.1 mg/dL (ref 0.4–1.2)
GFR: 49.04 mL/min — ABNORMAL LOW (ref >60.00)
Glucose, Bld: 146 mg/dL — ABNORMAL HIGH (ref 70–99)
POTASSIUM: 4.2 meq/L (ref 3.5–5.1)
Sodium: 139 mEq/L (ref 135–145)

## 2013-10-12 LAB — BRAIN NATRIURETIC PEPTIDE: PRO B NATRI PEPTIDE: 114 pg/mL — AB (ref 0.0–100.0)

## 2013-10-12 NOTE — Patient Instructions (Signed)
Your physician has requested that you have a carotid duplex. This test is an ultrasound of the carotid arteries in your neck. It looks at blood flow through these arteries that supply the brain with blood. Allow one hour for this exam. There are no restrictions or special instructions.  Your physician has requested that you have an echocardiogram. Echocardiography is a painless test that uses sound waves to create images of your heart. It provides your doctor with information about the size and shape of your heart and how well your heart's chambers and valves are working. This procedure takes approximately one hour. There are no restrictions for this procedure.  LAB WORK TODAY; BMET, BNP  Your physician wants you to follow-up in: [place] [address] [place]. You will receive a reminder letter in the mail two months in advance. If you don't receive a letter, please call our office to schedule the follow-up appointment.

## 2013-10-12 NOTE — Progress Notes (Signed)
[place], [place], [place]  [address] Phone: [phone] Fax:  [phone]  Date:  10/12/2013   ID:  [name] [name], DOB 07-14-1930, MRN [id number]  PCP:  Donnajean Lopes, MD  Cardiologist:  Dr. Minus Breeding     History of Present Illness: [name] [name] is a 80 y.o. female with a hx of permanent AFib, HTN, HL, diabetes mellitus, mitral regurgitation, IBS, chronic diarrhea, COPD.  Echo (11/2011):  EF 65-70%, normal wall motion, mod to severe MAC, mod MR, mod LAE, mild increased PASP, small to mod effusion.  Holter (03/2012):  NSR, PVCs, short run of ATach.   She was hospitalized several times last year (pancreatitis, GI bleed, UTI).  She was recently evaluated and treated by GI for CDiff colitis.   Last seen by Dr. Minus Breeding 09/2012.    She denies chest pain. She denies dyspnea. However, she is noticeably out of breath walking into the office. She denies orthopnea, PND or edema. She denies syncope.  She stopped Lasix several mos ago without a change in her symptoms. She tells me she had a vascular screen last year with her insurance that demonstrated bilateral carotid artery "plaque."    Recent Labs: 09/27/2013: Hemoglobin 14.7   Wt Readings from Last 3 Encounters:  10/09/13 151 lb (68.493 kg)  10/09/13 151 lb 12.8 oz (68.856 kg)  09/19/13 152 lb (68.947 kg)     Past Medical History  Diagnosis Date  . Pancreatitis     ~2008 and again in 11/2011 s/p ERCP  . Hypertension   . Glaucoma   . High cholesterol   . GERD (gastroesophageal reflux disease)   . H/O hiatal hernia   . Atrial fibrillation   . Dizziness - light-headed   . Pneumonia 12/2011    "first time I know about"  . Type II diabetes mellitus   . Depression   . Chronic lower back pain   . COPD (chronic obstructive pulmonary disease)   . HCAP (healthcare-associated pneumonia) 11/21/2011  . Mitral regurgitation 03/11/2012  . Atrial fibrillation, rapid     Coumadin   . Chronic diarrhea 04/12/2012   . Metabolic encephalopathy 0/10/6008  . E-coli UTI 11/13/2011  . SIRS (systemic inflammatory response syndrome) 03/13/2012  . C. difficile colitis 09/21/2013    Current Outpatient Prescriptions  Medication Sig Dispense Refill  . albuterol (ACCUNEB) 0.63 MG/3ML nebulizer solution Take 1 ampule by nebulization 2 (two) times daily.      Marland Kitchen ALPRAZolam (XANAX) 1 MG tablet Take 0.5 tablets (0.5 mg total) by mouth 4 (four) times daily as needed for anxiety. For sleep or anxiety.  8 tablet  0  . diltiazem (CARDIZEM CD) 120 MG 24 hr capsule Take 1 capsule (120 mg total) by mouth daily.  90 capsule  3  . glimepiride (AMARYL) 4 MG tablet Take 4 mg by mouth 2 (two) times daily.      Marland Kitchen HYDROcodone-acetaminophen (VICODIN) 5-500 MG per tablet Take 1 tablet by mouth every 6 (six) hours as needed for pain.      Marland Kitchen LANTUS SOLOSTAR 100 UNIT/ML Solostar Pen       . latanoprost (XALATAN) 0.005 % ophthalmic solution Place 1 drop into both eyes at bedtime.      . methylphenidate (RITALIN) 5 MG tablet Take 5 mg by mouth 2 (two) times daily.      . metoprolol tartrate (LOPRESSOR) 25 MG tablet TAKE 1 TABLET BY MOUTH TWICE DAILY  60 tablet  0  . metroNIDAZOLE (  FLAGYL) 250 MG tablet       . Multiple Vitamin (MULTIVITAMIN WITH MINERALS) TABS Take 1 tablet by mouth daily.      Marland Kitchen omeprazole (PRILOSEC) 20 MG capsule Take 20 mg by mouth 2 (two) times daily.       Marland Kitchen saccharomyces boulardii (FLORASTOR) 250 MG capsule Take 1 capsule (250 mg total) by mouth 2 (two) times daily.  60 capsule  0  . sertraline (ZOLOFT) 50 MG tablet Take 50 mg by mouth every evening.      . vancomycin (VANCOCIN) 250 MG capsule Take 1 capsule (250 mg total) by mouth 4 (four) times daily.  56 capsule  0  . warfarin (COUMADIN) 5 MG tablet Take as directed by coumadin clinic  30 tablet  3  . zolpidem (AMBIEN) 5 MG tablet Take 5 mg by mouth at bedtime as needed for sleep.       No current facility-administered medications for this visit.    Allergies:    Lactose intolerance (gi); Penicillins; Sulfa antibiotics; and Ciprocinonide   Social History:  The patient  reports that she has never smoked. She has never used smokeless tobacco. She reports that she does not drink alcohol or use illicit drugs.   Family History:  The patient's family history includes Stroke (age of onset: 39) in her mother; Stroke (age of onset: 48) in her father. There is no history of Anesthesia problems, Hypotension, Malignant hyperthermia, or Pseudochol deficiency.   ROS:  Please see the history of present illness.   No bleeding problems.   All other systems reviewed and negative.   PHYSICAL EXAM: VS:  BP 130/64  Pulse 87  Ht 5' (1.524 m)  Wt 150 lb 12.8 oz (68.402 kg)  BMI 29.45 kg/m2 Well nourished, well developed, in no acute distress HEENT: normal Neck: no JVD at 90 degrees Vascular:  No carotid bruit Cardiac:  normal S1, S2; RRR; no murmur Lungs:  clear to auscultation bilaterally, no wheezing, rhonchi or rales Abd: soft, nontender, no hepatomegaly Ext: no edema Skin: warm and dry Neuro:  CNs 2-12 intact, no focal abnormalities noted  EKG:  Atrial fibrillation, HR 87  ASSESSMENT AND PLAN:  1. Dyspnea: She denies this. However, she seems to be dyspneic on exam. She does not look to be particularly volume overloaded. She has apparently had a problem with volume excess in the past. She stopped Lasix several months ago.  I will check a basic metabolic panel and BNP.  If her BNP is significantly elevated, resume Lasix. 2. Mitral Regurgitation:  Arrange followup echocardiogram. 3. Carotid Stenosis: This was noted on a screening done through insurance. I will arrange formal carotid Dopplers. 4. Atrial Fibrillation:  Rate controlled.  She remains on Coumadin which is managed by her primary care provider. 5. Hypertension:  Controlled. 6. Disposition:  Follow up with Dr. Percival Spanish in 6 months.  Signed, Richardson Dopp, PA-C  10/12/2013 2:40 PM

## 2013-10-12 NOTE — Progress Notes (Signed)
Agree with Ms. Esterwood's assessment and plan. Carl E. Gessner, MD, FACG   

## 2013-10-18 ENCOUNTER — Encounter (HOSPITAL_COMMUNITY): Payer: Medicare Other

## 2013-10-24 DIAGNOSIS — F411 Generalized anxiety disorder: Secondary | ICD-10-CM | POA: Insufficient documentation

## 2013-10-25 ENCOUNTER — Encounter: Payer: Self-pay | Admitting: Physician Assistant

## 2013-10-25 ENCOUNTER — Ambulatory Visit (HOSPITAL_COMMUNITY): Payer: Medicare Other | Attending: Cardiology

## 2013-10-25 ENCOUNTER — Encounter: Payer: Self-pay | Admitting: Cardiology

## 2013-10-25 ENCOUNTER — Telehealth: Payer: Self-pay | Admitting: *Deleted

## 2013-10-25 DIAGNOSIS — I6529 Occlusion and stenosis of unspecified carotid artery: Secondary | ICD-10-CM | POA: Insufficient documentation

## 2013-10-25 DIAGNOSIS — I10 Essential (primary) hypertension: Secondary | ICD-10-CM | POA: Insufficient documentation

## 2013-10-25 DIAGNOSIS — E785 Hyperlipidemia, unspecified: Secondary | ICD-10-CM | POA: Insufficient documentation

## 2013-10-25 DIAGNOSIS — I658 Occlusion and stenosis of other precerebral arteries: Secondary | ICD-10-CM | POA: Insufficient documentation

## 2013-10-25 DIAGNOSIS — E119 Type 2 diabetes mellitus without complications: Secondary | ICD-10-CM | POA: Insufficient documentation

## 2013-10-25 DIAGNOSIS — E7800 Pure hypercholesterolemia, unspecified: Secondary | ICD-10-CM

## 2013-10-25 NOTE — Telephone Encounter (Signed)
lvm with pt's brother Eddie Dibbles that I had her test results in and I will cb tomorrow. He said he will let her know.

## 2013-10-26 NOTE — Telephone Encounter (Signed)
pt's husband notified about carotid results with verbal understanding and repeat carotids in 1 yr.

## 2013-10-30 ENCOUNTER — Ambulatory Visit (HOSPITAL_COMMUNITY): Payer: Medicare Other | Attending: Physician Assistant | Admitting: Radiology

## 2013-10-30 ENCOUNTER — Other Ambulatory Visit (HOSPITAL_COMMUNITY): Payer: Self-pay | Admitting: Cardiology

## 2013-10-30 ENCOUNTER — Encounter: Payer: Self-pay | Admitting: Cardiology

## 2013-10-30 DIAGNOSIS — I059 Rheumatic mitral valve disease, unspecified: Secondary | ICD-10-CM

## 2013-10-30 DIAGNOSIS — J449 Chronic obstructive pulmonary disease, unspecified: Secondary | ICD-10-CM | POA: Insufficient documentation

## 2013-10-30 DIAGNOSIS — I340 Nonrheumatic mitral (valve) insufficiency: Secondary | ICD-10-CM

## 2013-10-30 DIAGNOSIS — E119 Type 2 diabetes mellitus without complications: Secondary | ICD-10-CM | POA: Insufficient documentation

## 2013-10-30 DIAGNOSIS — J4489 Other specified chronic obstructive pulmonary disease: Secondary | ICD-10-CM | POA: Insufficient documentation

## 2013-10-30 DIAGNOSIS — I10 Essential (primary) hypertension: Secondary | ICD-10-CM | POA: Insufficient documentation

## 2013-10-30 DIAGNOSIS — E7800 Pure hypercholesterolemia, unspecified: Secondary | ICD-10-CM | POA: Insufficient documentation

## 2013-10-30 DIAGNOSIS — R0602 Shortness of breath: Secondary | ICD-10-CM | POA: Insufficient documentation

## 2013-10-30 NOTE — Progress Notes (Signed)
Echocardiogram performed.  

## 2013-11-03 ENCOUNTER — Telehealth: Payer: Self-pay | Admitting: Physician Assistant

## 2013-11-03 ENCOUNTER — Encounter (HOSPITAL_COMMUNITY): Payer: Medicare Other

## 2013-11-03 NOTE — Telephone Encounter (Signed)
both pt and husband have been notified about echo results with verbal understanding to Plan of Care. I will fax results to Dr. [name] per Dr. Percival Spanish

## 2013-11-03 NOTE — Telephone Encounter (Signed)
New problem ° ° °Pt want to know results of echocardiogram. °

## 2013-11-13 ENCOUNTER — Other Ambulatory Visit: Payer: Self-pay | Admitting: Internal Medicine

## 2013-11-13 ENCOUNTER — Telehealth: Payer: Self-pay | Admitting: Physician Assistant

## 2013-11-13 DIAGNOSIS — R197 Diarrhea, unspecified: Secondary | ICD-10-CM

## 2013-11-13 MED ORDER — SACCHAROMYCES BOULARDII 250 MG PO CAPS: 250.0000 mg | ORAL_CAPSULE | Freq: Two times a day (BID) | ORAL | Status: DC

## 2013-11-13 NOTE — Telephone Encounter (Signed)
Lab in EPIC. Unable to reach patient's husband and no answering machine.  Will try again later.

## 2013-11-13 NOTE — Telephone Encounter (Signed)
Patient had been doing well since last OV per her husband. She had returned to her normal. Last week, she started having problems with controlling her "kidneys". Per husband, she is wearing Depends because she cannot control her kidneys. Also having to get up in night. Now, she is having problems controlling her bowels. This AM she started having more loose stools. States after she eats, within an 1/2 hour she has loose stools. She has several more loose stools then it is less in the afternoon. She is having some loose stools in the night also. She is more tired the last few days also. Please, advise.

## 2013-11-13 NOTE — Telephone Encounter (Signed)
Let's get her back on florastor twice daily, get stool for cdiff pcr... Last time the vanco cost them 1000.0.....[facility] and see if we can find it cheaper - 250 mg either tablet or liquid-in case she needs it . thanks

## 2013-11-13 NOTE — Telephone Encounter (Signed)
Spoke with patient's husband and gave him Nicoletta Ba, [place] recommendations. He will come by and pick up stool containers and start Florastor BID.

## 2013-11-14 ENCOUNTER — Other Ambulatory Visit: Payer: Medicare Other

## 2013-11-14 DIAGNOSIS — R197 Diarrhea, unspecified: Secondary | ICD-10-CM

## 2013-11-15 ENCOUNTER — Telehealth: Payer: Self-pay | Admitting: *Deleted

## 2013-11-15 LAB — CLOSTRIDIUM DIFFICILE BY PCR: CDIFFPCR: DETECTED — AB

## 2013-11-15 MED ORDER — SACCHAROMYCES BOULARDII 250 MG PO CAPS: ORAL_CAPSULE | ORAL | Status: DC

## 2013-11-15 MED ORDER — VANCOMYCIN HCL 250 MG PO CAPS: ORAL_CAPSULE | ORAL | Status: DC

## 2013-11-15 NOTE — Telephone Encounter (Signed)
Rx sent to [facility]. Scheduled OV with Dr. Carlean Purl on 01/02/14 at 1:30 PM. Patient's husband aware.

## 2013-11-15 NOTE — Telephone Encounter (Signed)
Ok-will need vancomycin 250 mg 4 x daily x 2 weeks, then 3 x daily x one week, then twice daily x one week, then once daily x one week. Also restart Florastor twice daily x 6 weeks. Please see if we can get it cheaper from a different Pharmacy . She should be seen in follow up by Gi MD when finishes course, unless problems in interim. Thanks

## 2013-11-15 NOTE — Telephone Encounter (Signed)
Received a phone call from [place] with [employer]. Patient has a + c. Diff result. Please, advise

## 2013-11-17 ENCOUNTER — Telehealth: Payer: Self-pay | Admitting: Internal Medicine

## 2013-11-17 NOTE — Telephone Encounter (Signed)
Patient started with mild vague epigastric pain this am, now is a 7 of 10.  She does have a history of pancreatitis.  She has taken a hydrocodone approx 1 hour hour ago and it has helped some.  They would prefer not to come in to the office.  Patient was also recently started on a liquid vancomycin (she was previously taking pills).  Discussed with Nicoletta Ba PA .  Patient to start on clear liquid diet, take pain meds as needed, She has plenty at home according to her husband. They are advised to also make sure she is taking her PPI.  They know to go to the ER if she develops worsening pain, nausea,  vomiting or fever.  They will call back next week Monday or Tuesday with an update.

## 2013-11-17 NOTE — Telephone Encounter (Signed)
Agree 

## 2013-11-20 ENCOUNTER — Telehealth: Payer: Self-pay | Admitting: Internal Medicine

## 2013-11-20 NOTE — Telephone Encounter (Signed)
Patient with continued abdominal pain over the weekend.  No fever.  She is taking pain meds prn.  She has occasional nausea.  They will come in the office tomorrow at 3:00 to see Nicoletta Ba PA

## 2013-11-21 ENCOUNTER — Ambulatory Visit: Payer: Medicare Other | Admitting: Physician Assistant

## 2013-11-23 ENCOUNTER — Emergency Department (HOSPITAL_COMMUNITY): Payer: Medicare Other

## 2013-11-23 ENCOUNTER — Encounter (HOSPITAL_COMMUNITY): Payer: Self-pay | Admitting: Emergency Medicine

## 2013-11-23 ENCOUNTER — Telehealth: Payer: Self-pay | Admitting: Internal Medicine

## 2013-11-23 ENCOUNTER — Inpatient Hospital Stay (HOSPITAL_COMMUNITY)
Admission: EM | Admit: 2013-11-23 | Discharge: 2013-11-27 | DRG: 872 | Disposition: A | Discharge status: Home / Self Care | Payer: Medicare Other | Attending: Internal Medicine | Admitting: Internal Medicine

## 2013-11-23 DIAGNOSIS — R1013 Epigastric pain: Secondary | ICD-10-CM

## 2013-11-23 DIAGNOSIS — R197 Diarrhea, unspecified: Secondary | ICD-10-CM

## 2013-11-23 DIAGNOSIS — I340 Nonrheumatic mitral (valve) insufficiency: Secondary | ICD-10-CM

## 2013-11-23 DIAGNOSIS — J4489 Other specified chronic obstructive pulmonary disease: Secondary | ICD-10-CM | POA: Diagnosis present

## 2013-11-23 DIAGNOSIS — A498 Other bacterial infections of unspecified site: Secondary | ICD-10-CM | POA: Diagnosis present

## 2013-11-23 DIAGNOSIS — K529 Noninfective gastroenteritis and colitis, unspecified: Secondary | ICD-10-CM

## 2013-11-23 DIAGNOSIS — J449 Chronic obstructive pulmonary disease, unspecified: Secondary | ICD-10-CM | POA: Diagnosis present

## 2013-11-23 DIAGNOSIS — K589 Irritable bowel syndrome without diarrhea: Secondary | ICD-10-CM

## 2013-11-23 DIAGNOSIS — F3289 Other specified depressive episodes: Secondary | ICD-10-CM | POA: Diagnosis present

## 2013-11-23 DIAGNOSIS — D72829 Elevated white blood cell count, unspecified: Secondary | ICD-10-CM | POA: Diagnosis present

## 2013-11-23 DIAGNOSIS — I4891 Unspecified atrial fibrillation: Secondary | ICD-10-CM | POA: Diagnosis present

## 2013-11-23 DIAGNOSIS — R159 Full incontinence of feces: Secondary | ICD-10-CM

## 2013-11-23 DIAGNOSIS — A419 Sepsis, unspecified organism: Principal | ICD-10-CM | POA: Diagnosis present

## 2013-11-23 DIAGNOSIS — E119 Type 2 diabetes mellitus without complications: Secondary | ICD-10-CM | POA: Diagnosis present

## 2013-11-23 DIAGNOSIS — Z794 Long term (current) use of insulin: Secondary | ICD-10-CM

## 2013-11-23 DIAGNOSIS — K219 Gastro-esophageal reflux disease without esophagitis: Secondary | ICD-10-CM | POA: Diagnosis present

## 2013-11-23 DIAGNOSIS — Z79899 Other long term (current) drug therapy: Secondary | ICD-10-CM

## 2013-11-23 DIAGNOSIS — F329 Major depressive disorder, single episode, unspecified: Secondary | ICD-10-CM

## 2013-11-23 DIAGNOSIS — E872 Acidosis, unspecified: Secondary | ICD-10-CM

## 2013-11-23 DIAGNOSIS — F411 Generalized anxiety disorder: Secondary | ICD-10-CM | POA: Diagnosis present

## 2013-11-23 DIAGNOSIS — M545 Low back pain, unspecified: Secondary | ICD-10-CM | POA: Diagnosis present

## 2013-11-23 DIAGNOSIS — Z7901 Long term (current) use of anticoagulants: Secondary | ICD-10-CM

## 2013-11-23 DIAGNOSIS — I059 Rheumatic mitral valve disease, unspecified: Secondary | ICD-10-CM | POA: Diagnosis present

## 2013-11-23 DIAGNOSIS — N3090 Cystitis, unspecified without hematuria: Secondary | ICD-10-CM | POA: Diagnosis present

## 2013-11-23 DIAGNOSIS — F419 Anxiety disorder, unspecified: Secondary | ICD-10-CM | POA: Diagnosis present

## 2013-11-23 DIAGNOSIS — F32A Depression, unspecified: Secondary | ICD-10-CM | POA: Diagnosis present

## 2013-11-23 DIAGNOSIS — G8929 Other chronic pain: Secondary | ICD-10-CM | POA: Diagnosis present

## 2013-11-23 DIAGNOSIS — R6520 Severe sepsis without septic shock: Secondary | ICD-10-CM | POA: Diagnosis present

## 2013-11-23 DIAGNOSIS — I10 Essential (primary) hypertension: Secondary | ICD-10-CM | POA: Diagnosis present

## 2013-11-23 DIAGNOSIS — H409 Unspecified glaucoma: Secondary | ICD-10-CM | POA: Diagnosis present

## 2013-11-23 DIAGNOSIS — N390 Urinary tract infection, site not specified: Secondary | ICD-10-CM

## 2013-11-23 DIAGNOSIS — E860 Dehydration: Secondary | ICD-10-CM | POA: Diagnosis present

## 2013-11-23 DIAGNOSIS — A0472 Enterocolitis due to Clostridium difficile, not specified as recurrent: Secondary | ICD-10-CM | POA: Diagnosis present

## 2013-11-23 DIAGNOSIS — R109 Unspecified abdominal pain: Secondary | ICD-10-CM

## 2013-11-23 DIAGNOSIS — I6529 Occlusion and stenosis of unspecified carotid artery: Secondary | ICD-10-CM | POA: Diagnosis present

## 2013-11-23 DIAGNOSIS — E7800 Pure hypercholesterolemia, unspecified: Secondary | ICD-10-CM | POA: Diagnosis present

## 2013-11-23 LAB — URINE MICROSCOPIC-ADD ON

## 2013-11-23 LAB — CBC WITH DIFFERENTIAL/PLATELET
BASOS PCT: 0 % (ref 0–1)
Basophils Absolute: 0 10*3/uL (ref 0.0–0.1)
Eosinophils Absolute: 0 10*3/uL (ref 0.0–0.7)
Eosinophils Relative: 0 % (ref 0–5)
HEMATOCRIT: 46.3 % — AB (ref 36.0–46.0)
HEMOGLOBIN: 16.6 g/dL — AB (ref 12.0–15.0)
LYMPHS PCT: 17 % (ref 12–46)
Lymphs Abs: 1.9 10*3/uL (ref 0.7–4.0)
MCH: 31.4 pg (ref 26.0–34.0)
MCHC: 35.9 g/dL (ref 30.0–36.0)
MCV: 87.7 fL (ref 78.0–100.0)
MONO ABS: 0.5 10*3/uL (ref 0.1–1.0)
MONOS PCT: 5 % (ref 3–12)
NEUTROS PCT: 78 % — AB (ref 43–77)
Neutro Abs: 9.1 10*3/uL — ABNORMAL HIGH (ref 1.7–7.7)
Platelets: 254 10*3/uL (ref 150–400)
RBC: 5.28 MIL/uL — AB (ref 3.87–5.11)
RDW: 13.7 % (ref 11.5–15.5)
WBC: 11.6 10*3/uL — AB (ref 4.0–10.5)

## 2013-11-23 LAB — PROTIME-INR
INR: 1.77 — AB (ref 0.00–1.49)
Prothrombin Time: 20.1 seconds — ABNORMAL HIGH (ref 11.6–15.2)

## 2013-11-23 LAB — URINALYSIS, ROUTINE W REFLEX MICROSCOPIC
Bilirubin Urine: NEGATIVE
Glucose, UA: NEGATIVE mg/dL
Ketones, ur: NEGATIVE mg/dL
Nitrite: POSITIVE — AB
PH: 6 (ref 5.0–8.0)
Protein, ur: 100 mg/dL — AB
SPECIFIC GRAVITY, URINE: 1.017 (ref 1.005–1.030)
UROBILINOGEN UA: 0.2 mg/dL (ref 0.0–1.0)

## 2013-11-23 LAB — GLUCOSE, CAPILLARY
GLUCOSE-CAPILLARY: 63 mg/dL — AB (ref 70–99)
Glucose-Capillary: 114 mg/dL — ABNORMAL HIGH (ref 70–99)

## 2013-11-23 LAB — COMPREHENSIVE METABOLIC PANEL
ALBUMIN: 4.1 g/dL (ref 3.5–5.2)
ALK PHOS: 81 U/L (ref 39–117)
ALT: 26 U/L (ref 0–35)
AST: 37 U/L (ref 0–37)
BILIRUBIN TOTAL: 0.7 mg/dL (ref 0.3–1.2)
BUN: 22 mg/dL (ref 6–23)
CO2: 15 meq/L — AB (ref 19–32)
CREATININE: 1.02 mg/dL (ref 0.50–1.10)
Calcium: 9.7 mg/dL (ref 8.4–10.5)
Chloride: 104 mEq/L (ref 96–112)
GFR calc Af Amer: 58 mL/min — ABNORMAL LOW (ref >90)
GFR, EST NON AFRICAN AMERICAN: 50 mL/min — AB (ref >90)
Glucose, Bld: 143 mg/dL — ABNORMAL HIGH (ref 70–99)
Potassium: 4.2 mEq/L (ref 3.7–5.3)
Sodium: 141 mEq/L (ref 137–147)
Total Protein: 8.2 g/dL (ref 6.0–8.3)

## 2013-11-23 LAB — LIPASE, BLOOD: LIPASE: 57 U/L (ref 11–59)

## 2013-11-23 LAB — I-STAT CG4 LACTIC ACID, ED: LACTIC ACID, VENOUS: 2.3 mmol/L — AB (ref 0.5–2.2)

## 2013-11-23 MED ORDER — IOHEXOL 300 MG/ML  SOLN
25.0000 mL | Freq: Once | INTRAMUSCULAR | Status: AC | PRN
  Administered 2013-11-23: 25 mL via ORAL

## 2013-11-23 MED ORDER — INSULIN ASPART 100 UNIT/ML ~~LOC~~ SOLN
0.0000 [IU] | Freq: Three times a day (TID) | SUBCUTANEOUS | Status: DC
  Administered 2013-11-24: 3 [IU] via SUBCUTANEOUS

## 2013-11-23 MED ORDER — MORPHINE SULFATE 4 MG/ML IJ SOLN
4.0000 mg | Freq: Once | INTRAMUSCULAR | Status: AC
Start: 2013-11-23 — End: 2013-11-23
  Administered 2013-11-23: 4 mg via INTRAVENOUS
  Filled 2013-11-23: qty 1

## 2013-11-23 MED ORDER — METRONIDAZOLE IN NACL 5-0.79 MG/ML-% IV SOLN
500.0000 mg | Freq: Three times a day (TID) | INTRAVENOUS | Status: DC
  Administered 2013-11-23 – 2013-11-24 (×2): 500 mg via INTRAVENOUS
  Filled 2013-11-23 (×4): qty 100

## 2013-11-23 MED ORDER — SODIUM CHLORIDE 0.9 % IV BOLUS (SEPSIS)
1000.0000 mL | Freq: Once | INTRAVENOUS | Status: AC
  Administered 2013-11-23: 1000 mL via INTRAVENOUS

## 2013-11-23 MED ORDER — METOPROLOL TARTRATE 25 MG PO TABS
25.0000 mg | ORAL_TABLET | Freq: Two times a day (BID) | ORAL | Status: DC
  Administered 2013-11-24 – 2013-11-27 (×8): 25 mg via ORAL
  Filled 2013-11-23 (×10): qty 1

## 2013-11-23 MED ORDER — DILTIAZEM HCL ER COATED BEADS 120 MG PO CP24
120.0000 mg | ORAL_CAPSULE | Freq: Every day | ORAL | Status: DC
  Administered 2013-11-24 – 2013-11-27 (×4): 120 mg via ORAL
  Filled 2013-11-23 (×4): qty 1

## 2013-11-23 MED ORDER — SODIUM CHLORIDE 0.9 % IV SOLN
INTRAVENOUS | Status: DC
  Administered 2013-11-23: 21:00:00 via INTRAVENOUS
  Administered 2013-11-24: 1000 mL via INTRAVENOUS
  Administered 2013-11-26 – 2013-11-27 (×2): via INTRAVENOUS

## 2013-11-23 MED ORDER — WARFARIN - PHARMACIST DOSING INPATIENT
Freq: Every day | Status: DC
  Administered 2013-11-24: 4

## 2013-11-23 MED ORDER — LATANOPROST 0.005 % OP SOLN
1.0000 [drp] | Freq: Every day | OPHTHALMIC | Status: DC
Start: 2013-11-23 — End: 2013-11-27
  Administered 2013-11-24 – 2013-11-26 (×4): 1 [drp] via OPHTHALMIC
  Filled 2013-11-23 (×2): qty 2.5

## 2013-11-23 MED ORDER — SERTRALINE HCL 50 MG PO TABS
50.0000 mg | ORAL_TABLET | Freq: Every evening | ORAL | Status: DC
  Administered 2013-11-23 – 2013-11-26 (×3): 50 mg via ORAL
  Filled 2013-11-23 (×5): qty 1

## 2013-11-23 MED ORDER — ALBUTEROL SULFATE (2.5 MG/3ML) 0.083% IN NEBU
3.0000 mL | INHALATION_SOLUTION | Freq: Two times a day (BID) | RESPIRATORY_TRACT | Status: DC
  Administered 2013-11-23 – 2013-11-25 (×4): 3 mL via RESPIRATORY_TRACT
  Filled 2013-11-23 (×5): qty 3

## 2013-11-23 MED ORDER — ONDANSETRON HCL 4 MG/2ML IJ SOLN: 4.0000 mg | Freq: Four times a day (QID) | INTRAMUSCULAR | Status: DC | PRN

## 2013-11-23 MED ORDER — INSULIN GLARGINE 100 UNIT/ML ~~LOC~~ SOLN: 10.0000 [IU] | Freq: Every day | SUBCUTANEOUS | Status: DC

## 2013-11-23 MED ORDER — VANCOMYCIN 50 MG/ML ORAL SOLUTION
500.0000 mg | Freq: Four times a day (QID) | ORAL | Status: DC
  Administered 2013-11-24 (×3): 500 mg via ORAL
  Filled 2013-11-23 (×7): qty 10

## 2013-11-23 MED ORDER — ZOLPIDEM TARTRATE 5 MG PO TABS
5.0000 mg | ORAL_TABLET | Freq: Every evening | ORAL | Status: DC | PRN
  Administered 2013-11-26 (×2): 5 mg via ORAL
  Filled 2013-11-23 (×2): qty 1

## 2013-11-23 MED ORDER — CIPROFLOXACIN IN D5W 400 MG/200ML IV SOLN
400.0000 mg | Freq: Once | INTRAVENOUS | Status: DC
  Filled 2013-11-23: qty 200

## 2013-11-23 MED ORDER — WARFARIN SODIUM 4 MG PO TABS
4.0000 mg | ORAL_TABLET | Freq: Once | ORAL | Status: AC
  Administered 2013-11-23: 4 mg via ORAL
  Filled 2013-11-23: qty 1

## 2013-11-23 MED ORDER — DEXTROSE 5 % IV SOLN
1.0000 g | Freq: Once | INTRAVENOUS | Status: AC
  Administered 2013-11-23: 1 g via INTRAVENOUS
  Filled 2013-11-23: qty 10

## 2013-11-23 MED ORDER — ALPRAZOLAM 0.5 MG PO TABS
0.5000 mg | ORAL_TABLET | Freq: Four times a day (QID) | ORAL | Status: DC | PRN
  Administered 2013-11-24 – 2013-11-26 (×4): 0.5 mg via ORAL
  Filled 2013-11-23 (×4): qty 1

## 2013-11-23 MED ORDER — INSULIN GLARGINE 100 UNIT/ML ~~LOC~~ SOLN
14.0000 [IU] | Freq: Every day | SUBCUTANEOUS | Status: DC
  Filled 2013-11-23: qty 0.14

## 2013-11-23 MED ORDER — HYDROCODONE-ACETAMINOPHEN 5-325 MG PO TABS
1.0000 | ORAL_TABLET | Freq: Four times a day (QID) | ORAL | Status: DC | PRN
  Administered 2013-11-24 (×2): 1 via ORAL
  Filled 2013-11-23 (×2): qty 1

## 2013-11-23 MED ORDER — NITROFURANTOIN MONOHYD MACRO 100 MG PO CAPS: 100.0000 mg | ORAL_CAPSULE | Freq: Once | ORAL | Status: DC

## 2013-11-23 MED ORDER — INSULIN GLARGINE 100 UNIT/ML ~~LOC~~ SOLN
10.0000 [IU] | Freq: Every day | SUBCUTANEOUS | Status: DC
  Administered 2013-11-24 (×2): 10 [IU] via SUBCUTANEOUS
  Filled 2013-11-23 (×4): qty 0.1

## 2013-11-23 MED ORDER — SACCHAROMYCES BOULARDII 250 MG PO CAPS
250.0000 mg | ORAL_CAPSULE | Freq: Two times a day (BID) | ORAL | Status: DC
  Administered 2013-11-23 – 2013-11-27 (×7): 250 mg via ORAL
  Filled 2013-11-23 (×9): qty 1

## 2013-11-23 MED ORDER — IOHEXOL 300 MG/ML  SOLN
80.0000 mL | Freq: Once | INTRAMUSCULAR | Status: AC | PRN
  Administered 2013-11-23: 80 mL via INTRAVENOUS

## 2013-11-23 MED ORDER — DEXTROSE 5 % IV SOLN
1.0000 g | INTRAVENOUS | Status: AC
  Administered 2013-11-24 – 2013-11-25 (×3): 1 g via INTRAVENOUS
  Filled 2013-11-23 (×3): qty 10

## 2013-11-23 MED ORDER — INSULIN GLARGINE 100 UNIT/ML SOLOSTAR PEN: 14.0000 [IU] | PEN_INJECTOR | Freq: Every day | SUBCUTANEOUS | Status: DC

## 2013-11-23 MED ORDER — ADULT MULTIVITAMIN W/MINERALS CH
1.0000 | ORAL_TABLET | Freq: Every day | ORAL | Status: DC
  Administered 2013-11-24 – 2013-11-27 (×4): 1 via ORAL
  Filled 2013-11-23 (×4): qty 1

## 2013-11-23 MED ORDER — ONDANSETRON HCL 4 MG PO TABS: 4.0000 mg | ORAL_TABLET | Freq: Four times a day (QID) | ORAL | Status: DC | PRN

## 2013-11-23 MED ORDER — SODIUM CHLORIDE 0.9 % IJ SOLN
3.0000 mL | Freq: Two times a day (BID) | INTRAMUSCULAR | Status: DC
  Administered 2013-11-24: 3 mL via INTRAVENOUS

## 2013-11-23 MED ORDER — HEPARIN SODIUM (PORCINE) 5000 UNIT/ML IJ SOLN
5000.0000 [IU] | Freq: Three times a day (TID) | INTRAMUSCULAR | Status: DC
  Administered 2013-11-23 – 2013-11-25 (×6): 5000 [IU] via SUBCUTANEOUS
  Filled 2013-11-23 (×8): qty 1

## 2013-11-23 NOTE — ED Notes (Signed)
Family at bedside report she was dx with C.diff. Pt placed on contact precautions.

## 2013-11-23 NOTE — Progress Notes (Signed)
NURSING PROGRESS NOTE  [name] [name] [id number] Admission Data: 11/23/2013 1956 Attending Provider: Velvet Bathe, MD DGU:[id number],VZDGLO G, MD Code Status: Full  [name] [name] is a 80 y.o. female patient admitted from ED:  -No acute distress noted.  -No complaints of shortness of breath.  -No complaints of chest pain.   Cardiac Monitoring: Box # 18 in place. Cardiac monitor yields: Atrial Fibrillation  Blood pressure 150/87, pulse 80, temperature 97.5 F (36.4 C), temperature source Oral, resp. rate 18, height 5' (1.524 m), weight 66.3 kg (146 lb 2.6 oz), SpO2 96.00%.   IV Fluids:  IV in place, occlusive dsg intact without redness, IV cath forearm left, condition patent and no redness normal saline.   Allergies:  Lactose intolerance (gi); Penicillins; Sulfa antibiotics; and Ciprofloxacin  Past Medical History:   has a past medical history of Pancreatitis; Hypertension; Glaucoma; High cholesterol; GERD (gastroesophageal reflux disease); H/O hiatal hernia; Atrial fibrillation; Dizziness - light-headed; Pneumonia (12/2011); Type II diabetes mellitus; Depression; Chronic lower back pain; COPD (chronic obstructive pulmonary disease); HCAP (healthcare-associated pneumonia) (11/21/2011); Mitral regurgitation (03/11/2012); Atrial fibrillation, rapid; Chronic diarrhea (04/12/2012); Metabolic encephalopathy (04/08/6432); E-coli UTI (11/13/2011); SIRS (systemic inflammatory response syndrome) (03/13/2012); C. difficile colitis (09/21/2013); and Carotid stenosis.  Past Surgical History:   has past surgical history that includes ERCP (11/13/2011); Tonsillectomy and adenoidectomy (~ 1939); Cholecystectomy (1990's); Appendectomy (~ 1960); Vaginal hysterectomy (~ 1960's); Dilation and curettage of uterus; Cataract extraction w/ intraocular lens  implant, bilateral (2000's); and Colonoscopy w/ biopsies.  Social History:   reports that she has never smoked. She has never used smokeless tobacco. She reports  that she does not drink alcohol or use illicit drugs.  Skin: WDL  Patient oriented to room. Information packet given to patient. Admission inpatient armband information verified with patient to include name and date of birth and placed on patient arm. Side rails up x 2, fall assessment and education completed with patient. Patient able to verbalize understanding of risk associated with falls and verbalized understanding to call for assistance before getting out of bed. Call light within reach. Patient able to voice and demonstrate understanding of unit orientation instructions.

## 2013-11-23 NOTE — H&P (Signed)
[facility] History and Physical  [name] [name] RFF:[id number] DOB: Sep 28, 1930 DOA: 11/23/2013  Referring physician: Dr. Judithann Graves PCP: Donnajean Lopes, MD   Chief Complaint: Abdominal discomfort  HPI: [name] [name] is a 80 y.o. female  Presenting with above complaint. Patient has history of C. difficile of which she has had 8 days of oral vancomycin therapy. Has had poor oral intake and abdominal discomfort which has been progressively getting worse within the last week. Given persistent and worsening abdominal discomfort patient decided to come to the ED for evaluation. She is also endorsing dysuria. Nothing she is aware of makes it better or worse.  While in the ED patient was found to have acidosis, elevated lactic acid, normal lipase, and elevated white blood cell count.   Review of Systems:  Constitutional:  No weight loss, night sweats, Fevers, chills, fatigue.  HEENT:  No headaches, Difficulty swallowing,Tooth/dental problems,Sore throat,  No sneezing, itching, ear ache, nasal congestion, post nasal drip,  Cardio-vascular:  No chest pain, Orthopnea, PND, swelling in lower extremities, anasarca, dizziness, palpitations  GI:  No heartburn, indigestion, + abdominal pain, nausea, vomiting, + diarrhea, + change in bowel habits, + loss of appetite  Resp:  No shortness of breath with exertion or at rest. No excess mucus, no productive cough, No non-productive cough, No coughing up of blood.No change in color of mucus.No wheezing.No chest wall deformity  Skin:  no rash or lesions.  GU:  + dysuria, change in color of urine, no urgency or frequency. No flank pain.  Musculoskeletal:  No joint pain or swelling. No decreased range of motion. No back pain.  Psych:  No change in mood or affect. No depression or anxiety. No memory loss.   Past Medical History  Diagnosis Date  . Pancreatitis     ~2008 and again in 11/2011 s/p ERCP  . Hypertension   . Glaucoma   . High  cholesterol   . GERD (gastroesophageal reflux disease)   . H/O hiatal hernia   . Atrial fibrillation   . Dizziness - light-headed   . Pneumonia 12/2011    "first time I know about"  . Type II diabetes mellitus   . Depression   . Chronic lower back pain   . COPD (chronic obstructive pulmonary disease)   . HCAP (healthcare-associated pneumonia) 11/21/2011  . Mitral regurgitation 03/11/2012  . Atrial fibrillation, rapid     Coumadin   . Chronic diarrhea 04/12/2012  . Metabolic encephalopathy 12/08/7015  . E-coli UTI 11/13/2011  . SIRS (systemic inflammatory response syndrome) 03/13/2012  . C. difficile colitis 09/21/2013  . Carotid stenosis     a. Carotid US (10/2013):  bilat 1-39%; f/u 1 year   Past Surgical History  Procedure Laterality Date  . Ercp  11/13/2011    Procedure: ENDOSCOPIC RETROGRADE CHOLANGIOPANCREATOGRAPHY (ERCP);  Surgeon: Beryle Beams, MD;  Location: [facility] ENDOSCOPY;  Service: Endoscopy;  Laterality: N/A;  . Tonsillectomy and adenoidectomy  ~ 1939  . Cholecystectomy  1990's  . Appendectomy  ~ 1960  . Vaginal hysterectomy  ~ 1960's  . Dilation and curettage of uterus    . Cataract extraction w/ intraocular lens  implant, bilateral  2000's  . Colonoscopy w/ biopsies     Social History:  reports that she has never smoked. She has never used smokeless tobacco. She reports that she does not drink alcohol or use illicit drugs.  Allergies  Allergen Reactions  . Lactose Intolerance (Gi) Nausea And Vomiting    severe stomach pain  .  Penicillins Hives and Itching    "haven't used any since 1970's"  . Sulfa Antibiotics Hives and Itching    "haven't used any since 1970's"  . Ciprofloxacin Swelling    Family History  Problem Relation Age of Onset  . Anesthesia problems Neg Hx   . Hypotension Neg Hx   . Malignant hyperthermia Neg Hx   . Pseudochol deficiency Neg Hx   . Stroke Father 39  . Stroke Mother 60     Prior to Admission medications   Medication Sig Start Date End  Date Taking? Authorizing Provider  albuterol (ACCUNEB) 0.63 MG/3ML nebulizer solution Take 1 ampule by nebulization 2 (two) times daily.   Yes Historical Provider, MD  ALPRAZolam Duanne Moron) 1 MG tablet Take 0.5 tablets (0.5 mg total) by mouth 4 (four) times daily as needed for anxiety. For sleep or anxiety. 04/18/12  Yes Srikar Janna Arch, MD  diltiazem (CARDIZEM CD) 120 MG 24 hr capsule Take 1 capsule (120 mg total) by mouth daily. 05/24/13 05/24/14 Yes Minus Breeding, MD  glimepiride (AMARYL) 4 MG tablet Take 4 mg by mouth daily with breakfast.    Yes Historical Provider, MD  HYDROcodone-acetaminophen (VICODIN) 5-500 MG per tablet Take 1 tablet by mouth every 6 (six) hours as needed for pain.   Yes Historical Provider, MD  LANTUS SOLOSTAR 100 UNIT/ML Solostar Pen Inject 28 Units into the skin daily.  09/01/13  Yes Historical Provider, MD  latanoprost (XALATAN) 0.005 % ophthalmic solution Place 1 drop into both eyes at bedtime.   Yes Historical Provider, MD  methylphenidate (RITALIN) 5 MG tablet Take 5 mg by mouth 2 (two) times daily.   Yes Historical Provider, MD  metoprolol tartrate (LOPRESSOR) 25 MG tablet TAKE 1 TABLET BY MOUTH TWICE DAILY 08/26/13  Yes Minus Breeding, MD  Multiple Vitamin (MULTIVITAMIN WITH MINERALS) TABS Take 1 tablet by mouth daily.   Yes Historical Provider, MD  omeprazole (PRILOSEC) 20 MG capsule Take 20 mg by mouth 2 (two) times daily.    Yes Historical Provider, MD  saccharomyces boulardii (FLORASTOR) 250 MG capsule Take one po BID x 6 weeks 11/15/13  Yes Amy S Esterwood, PA-C  sertraline (ZOLOFT) 50 MG tablet Take 50 mg by mouth every evening.   Yes Historical Provider, MD  vancomycin (VANCOCIN) 250 MG capsule Take 250 mg QID x 2 weeks then TID x 1 week then BID x 1 week then daily x 1 week. May compound liquid 11/15/13  Yes Amy S Esterwood, PA-C  warfarin (COUMADIN) 2.5 MG tablet Take 2.5 mg by mouth daily.   Yes Historical Provider, MD  zolpidem (AMBIEN) 5 MG tablet Take 5 mg by  mouth at bedtime as needed for sleep.   Yes Historical Provider, MD   Physical Exam: Filed Vitals:   11/23/13 1730  BP: 149/93  Pulse: 73  Temp:   Resp: 14    BP 149/93  Pulse 73  Temp(Src) 97.9 F (36.6 C) (Oral)  Resp 14  Ht 5' (1.524 m)  Wt 66.679 kg (147 lb)  BMI 28.71 kg/m2  SpO2 98%  General:  Appears calm and comfortable Eyes: PERRL, normal lids, irises & conjunctiva ENT: grossly normal hearing, lips & tongue, dry mucous membranes Neck: no LAD, masses or thyromegaly Cardiovascular: Irregularly irregular, no m/r/g.  Respiratory: CTA bilaterally, no w/r/r. Normal respiratory effort. Abdomen: soft, ntnd Skin: no rash or induration seen on limited exam Musculoskeletal: grossly normal tone BUE/BLE Psychiatric: grossly normal mood and affect, speech fluent and appropriate Neurologic: grossly  non-focal.          Labs on Admission:  Basic Metabolic Panel:  Recent Labs Lab 11/23/13 1306  NA 141  K 4.2  CL 104  CO2 15*  GLUCOSE 143*  BUN 22  CREATININE 1.02  CALCIUM 9.7   Liver Function Tests:  Recent Labs Lab 11/23/13 1306  AST 37  ALT 26  ALKPHOS 81  BILITOT 0.7  PROT 8.2  ALBUMIN 4.1    Recent Labs Lab 11/23/13 1306  LIPASE 57   No results found for this basename: AMMONIA,  in the last 168 hours CBC:  Recent Labs Lab 11/23/13 1306  WBC 11.6*  NEUTROABS 9.1*  HGB 16.6*  HCT 46.3*  MCV 87.7  PLT 254   Cardiac Enzymes: No results found for this basename: CKTOTAL, CKMB, CKMBINDEX, TROPONINI,  in the last 168 hours  BNP (last 3 results)  Recent Labs  10/12/13 1527  PROBNP 114.0*   CBG: No results found for this basename: GLUCAP,  in the last 168 hours  Radiological Exams on Admission: Ct Abdomen Pelvis W Contrast  11/23/2013   CLINICAL DATA:  Abdominal pain, on vancomycin for 8 days for an intestinal infection (C difficile colitis 09/21/2013 per EHR), history of hypertension, pancreatitis, hiatal hernia, diabetes, COPD  EXAM:  CT ABDOMEN AND PELVIS WITH CONTRAST  TECHNIQUE: Multidetector CT imaging of the abdomen and pelvis was performed using the standard protocol following bolus administration of intravenous contrast. Sagittal and coronal MPR images reconstructed from axial data set.  CONTRAST:  49mL OMNIPAQUE IOHEXOL 300 MG/ML SOLN. Dilute oral contrast.  COMPARISON:  08/06/2012  FINDINGS: Lung bases clear.  Mitral annular calcifications and scattered atherosclerotic calcifications.  Simple appearing cyst upper pole right kidney 4.3 x 3.7 cm image 26 minimally increased.  Liver, spleen, pancreas, kidneys, and adrenal glands otherwise normal appearance.  Stomach decompressed, wall thickness suboptimally assessed.  Appendix and uterus surgically absent with normal sized ovaries.  Unremarkable bladder and ureters.  Scattered sigmoid diverticulosis without evidence of diverticulitis.  Large and small bowel loops otherwise unremarkable.  No mass, adenopathy, free fluid, or inflammatory process.  Bones demineralized.  IMPRESSION: Sigmoid diverticulosis.  Minimal increase in size of a right renal cyst since 2013.  No acute intra-abdominal or intrapelvic abnormalities.   Electronically Signed   By: Lavonia Dana M.D.   On: 11/23/2013 16:27     Assessment/Plan    C. difficile colitis - Unclear whether this is worsening C. difficile colitis as patient denies any worsening diarrhea. She is on day 8 of oral vancomycin. While in house we'll add IV Flagyl. - Obtain C. difficile PCR    Metabolic acidosis - Unclear etiology although patient has lactic acidosis and is dehydrated - We'll administer normal saline - Reassess with BMP next a.m.  UTI - Will obtain urine culture - Cover with Rocephin at this point  Active Problems:   DM II (diabetes mellitus, type II), controlled - Will hold oral hypoglycemic agents - Given decrease oral intake will give half of her home Lantus dose    HTN (hypertension) - Will continue diltiazem and  metoprolol    Hypercholesteremia - Stable    Glaucoma (increased eye pressure) - Stable continue home regimen    Atrial fibrillation  - Coumadin per pharmacy - Rate controlling medication metoprolol and diltiazem on board    Depression/Anxiety -Stable continue Zoloft and alprazolam   Code Status: presumed full Family Communication: discussed with family at bedside (husband, brother) Disposition Plan: Pending improvement  in condition  Time spent: > 60 minutes  Velvet Bathe [facility] Pager [phone]

## 2013-11-23 NOTE — ED Notes (Signed)
Pt c/o lower abd pain, reports she was dx with intestinal infection but can't remember what kind. Pt reports they put her on vancomycin but it is taking too long to get better. Pt c/o diarrhea and nausea, denies vomiting. Pt reports she hasn't been urinating as often as normal. Nad, skin warm and dry, resp e/u.

## 2013-11-23 NOTE — ED Notes (Signed)
Pt to CT at this time.

## 2013-11-23 NOTE — Telephone Encounter (Signed)
Patient's husband called back pain has returned.  She is having abdominal pain, leg cramps, nausea.  See previous phone notes from this and last week.  Discussed with Dr. Carlean Purl patient to go to ER or Urgent care for eval.  Husband notified of Dr. Celesta Aver recommendations.

## 2013-11-23 NOTE — ED Notes (Signed)
Pt remains in CT at this time.  

## 2013-11-23 NOTE — ED Notes (Signed)
Pt returned to room from CT, no distress noted 

## 2013-11-23 NOTE — ED Notes (Signed)
Per EMS - pt coming from home. Pt has been on vancomycin for 8 days now for an intestinal infection. Pt c/o abd pain, was going to go to PCP but pain increased so she came here. HR 90-110 in a.fib. Hx of CHF. BP 142/80. CBG 209, hx DM. Pt reports she hasn't eaten today.

## 2013-11-23 NOTE — ED Notes (Signed)
Pt made aware of need for stool sample. 

## 2013-11-23 NOTE — Progress Notes (Signed)
ANTICOAGULATION AND ANTIBIOTIC CONSULT NOTE - Initial Consult  Pharmacy Consult for Warfarin Indication: atrial fibrillation  Pharmacy Consult for Ceftriaxone Indication: UTI   Allergies  Allergen Reactions  . Lactose Intolerance (Gi) Nausea And Vomiting    severe stomach pain  . Penicillins Hives and Itching    "haven't used any since 1970's"  . Sulfa Antibiotics Hives and Itching    "haven't used any since 1970's"  . Ciprofloxacin Swelling    Patient Measurements: Height: 5' (152.4 cm) Weight: 146 lb 2.6 oz (66.3 kg) IBW/kg (Calculated) : 45.5 Heparin Dosing Weight: n/a  Vital Signs: Temp: 97.5 F (36.4 C) (02/19 1956) Temp src: Oral (02/19 1956) BP: 150/87 mmHg (02/19 1956) Pulse Rate: 85 (02/19 1956)  Labs:  Recent Labs  11/23/13 1306 11/23/13 1351  HGB 16.6*  --   HCT 46.3*  --   PLT 254  --   LABPROT  --  20.1*  INR  --  1.77*  CREATININE 1.02  --     Estimated Creatinine Clearance: 36.7 ml/min (by C-G formula based on Cr of 1.02).   Medical History: Past Medical History  Diagnosis Date  . Pancreatitis     ~2008 and again in 11/2011 s/p ERCP  . Hypertension   . Glaucoma   . High cholesterol   . GERD (gastroesophageal reflux disease)   . H/O hiatal hernia   . Atrial fibrillation   . Dizziness - light-headed   . Pneumonia 12/2011    "first time I know about"  . Type II diabetes mellitus   . Depression   . Chronic lower back pain   . COPD (chronic obstructive pulmonary disease)   . HCAP (healthcare-associated pneumonia) 11/21/2011  . Mitral regurgitation 03/11/2012  . Atrial fibrillation, rapid     Coumadin   . Chronic diarrhea 04/12/2012  . Metabolic encephalopathy 05/07/3824  . E-coli UTI 11/13/2011  . SIRS (systemic inflammatory response syndrome) 03/13/2012  . C. difficile colitis 09/21/2013  . Carotid stenosis     a. Carotid US (10/2013):  bilat 1-39%; f/u 1 year    Medications:  Prescriptions prior to admission  Medication Sig Dispense  Refill  . albuterol (ACCUNEB) 0.63 MG/3ML nebulizer solution Take 1 ampule by nebulization 2 (two) times daily.      Marland Kitchen ALPRAZolam (XANAX) 1 MG tablet Take 0.5 tablets (0.5 mg total) by mouth 4 (four) times daily as needed for anxiety. For sleep or anxiety.  8 tablet  0  . diltiazem (CARDIZEM CD) 120 MG 24 hr capsule Take 1 capsule (120 mg total) by mouth daily.  90 capsule  3  . glimepiride (AMARYL) 4 MG tablet Take 4 mg by mouth daily with breakfast.       . HYDROcodone-acetaminophen (VICODIN) 5-500 MG per tablet Take 1 tablet by mouth every 6 (six) hours as needed for pain.      Marland Kitchen LANTUS SOLOSTAR 100 UNIT/ML Solostar Pen Inject 28 Units into the skin daily.       Marland Kitchen latanoprost (XALATAN) 0.005 % ophthalmic solution Place 1 drop into both eyes at bedtime.      . methylphenidate (RITALIN) 5 MG tablet Take 5 mg by mouth 2 (two) times daily.      . metoprolol tartrate (LOPRESSOR) 25 MG tablet TAKE 1 TABLET BY MOUTH TWICE DAILY  60 tablet  0  . Multiple Vitamin (MULTIVITAMIN WITH MINERALS) TABS Take 1 tablet by mouth daily.      Marland Kitchen omeprazole (PRILOSEC) 20 MG capsule Take 20 mg  by mouth 2 (two) times daily.       Marland Kitchen saccharomyces boulardii (FLORASTOR) 250 MG capsule Take one po BID x 6 weeks  60 capsule  1  . sertraline (ZOLOFT) 50 MG tablet Take 50 mg by mouth every evening.      . vancomycin (VANCOCIN) 250 MG capsule Take 250 mg QID x 2 weeks then TID x 1 week then BID x 1 week then daily x 1 week. May compound liquid  100 capsule  0  . warfarin (COUMADIN) 2.5 MG tablet Take 2.5 mg by mouth daily.      Marland Kitchen zolpidem (AMBIEN) 5 MG tablet Take 5 mg by mouth at bedtime as needed for sleep.        Assessment: 12 YOF with CC of abdominal discomfort pending C.diff PCR. Pharmacy consulted to resume coumadin for AFib and start rocephin for possible UTI. Patient takes coumadin 2.5 mg daily at home. Last dose was taken yesterday. INR is sub-therapeutic at 1.77 today. H/H 16.6/46.3. Plt wnl.    Goal of Therapy:   INR 2-3 Monitor platelets by anticoagulation protocol: Yes   Plan:  1) Boost with coumadin 4 mg x 1 dose  2) Start Rocephin 1 gm IV Q 24 hours  3) Monitor daily INR, CBC and s/s of bleeding 4) Pharmacy will sign off antibiotic consult since rocephin does not require renal dose adjustments  Albertina Parr, PharmD.  Clinical Pharmacist Pager [phone]

## 2013-11-23 NOTE — Progress Notes (Addendum)
Hypoglycemic Event  CBG: 63  Treatment: 15 GM carbohydrate snack  Symptoms: None  Follow-up CBG: Time: 2240 CBG Result:114  Possible Reasons for Event: Inadequate meal intake  Comments/MD notified: M. Lynch notified    Karolee Stamps L  Remember to initiate Hypoglycemia Order Set & complete

## 2013-11-23 NOTE — ED Provider Notes (Signed)
CSN: [id number]     Arrival date & time 11/23/13  1255 History   First MD Initiated Contact with Patient 11/23/13 1258     Chief Complaint  Patient presents with  . Abdominal Pain      Patient is a 80 y.o. female presenting with diarrhea.  Diarrhea Quality:  Watery Severity:  Moderate Onset quality:  Gradual Duration:  1 month Timing:  Intermittent Progression:  Worsening Relieved by:  Nothing Ineffective treatments: vancomycin. Associated symptoms: abdominal pain, arthralgias, chills and fever   Associated symptoms: no recent cough, no diaphoresis, no headaches, no myalgias, no URI and no vomiting   Risk factors: recent antibiotic use   Risk factors: no sick contacts, no suspicious food intake and no travel to endemic areas     Past Medical History  Diagnosis Date  . Pancreatitis     ~2008 and again in 11/2011 s/p ERCP  . Hypertension   . Glaucoma   . High cholesterol   . GERD (gastroesophageal reflux disease)   . H/O hiatal hernia   . Atrial fibrillation   . Dizziness - light-headed   . Pneumonia 12/2011    "first time I know about"  . Type II diabetes mellitus   . Depression   . Chronic lower back pain   . COPD (chronic obstructive pulmonary disease)   . HCAP (healthcare-associated pneumonia) 11/21/2011  . Mitral regurgitation 03/11/2012  . Atrial fibrillation, rapid     Coumadin   . Chronic diarrhea 04/12/2012  . Metabolic encephalopathy 0/05/6577  . E-coli UTI 11/13/2011  . SIRS (systemic inflammatory response syndrome) 03/13/2012  . C. difficile colitis 09/21/2013  . Carotid stenosis     a. Carotid US (10/2013):  bilat 1-39%; f/u 1 year   Past Surgical History  Procedure Laterality Date  . Ercp  11/13/2011    Procedure: ENDOSCOPIC RETROGRADE CHOLANGIOPANCREATOGRAPHY (ERCP);  Surgeon: Beryle Beams, MD;  Location: [facility] ENDOSCOPY;  Service: Endoscopy;  Laterality: N/A;  . Tonsillectomy and adenoidectomy  ~ 1939  . Cholecystectomy  1990's  . Appendectomy  ~ 1960  .  Vaginal hysterectomy  ~ 1960's  . Dilation and curettage of uterus    . Cataract extraction w/ intraocular lens  implant, bilateral  2000's  . Colonoscopy w/ biopsies     Family History  Problem Relation Age of Onset  . Anesthesia problems Neg Hx   . Hypotension Neg Hx   . Malignant hyperthermia Neg Hx   . Pseudochol deficiency Neg Hx   . Stroke Father 105  . Stroke Mother 81   History  Substance Use Topics  . Smoking status: Never Smoker   . Smokeless tobacco: Never Used  . Alcohol Use: No   OB History   Grav Para Term Preterm Abortions TAB SAB Ect Mult Living                 Review of Systems  Constitutional: Positive for fever and chills. Negative for diaphoresis, activity change and appetite change.  HENT: Negative for congestion, ear pain and rhinorrhea.   Eyes: Negative for pain.  Respiratory: Negative for cough and shortness of breath.   Cardiovascular: Negative for chest pain and palpitations.  Gastrointestinal: Positive for abdominal pain and diarrhea. Negative for nausea and vomiting.  Genitourinary: Negative for dysuria, difficulty urinating and pelvic pain.  Musculoskeletal: Positive for arthralgias. Negative for back pain, myalgias and neck pain.  Skin: Negative for rash and wound.  Neurological: Negative for weakness and headaches.  Psychiatric/Behavioral: Negative  for behavioral problems, confusion and agitation.      Allergies  Lactose intolerance (gi); Penicillins; Sulfa antibiotics; and Ciprocinonide  Home Medications   Current Outpatient Rx  Name  Route  Sig  Dispense  Refill  . albuterol (ACCUNEB) 0.63 MG/3ML nebulizer solution   Nebulization   Take 1 ampule by nebulization 2 (two) times daily.         Marland Kitchen ALPRAZolam (XANAX) 1 MG tablet   Oral   Take 0.5 tablets (0.5 mg total) by mouth 4 (four) times daily as needed for anxiety. For sleep or anxiety.   8 tablet   0   . diltiazem (CARDIZEM CD) 120 MG 24 hr capsule   Oral   Take 1 capsule  (120 mg total) by mouth daily.   90 capsule   3   . glimepiride (AMARYL) 4 MG tablet   Oral   Take 4 mg by mouth daily with breakfast.          . HYDROcodone-acetaminophen (VICODIN) 5-500 MG per tablet   Oral   Take 1 tablet by mouth every 6 (six) hours as needed for pain.         Marland Kitchen LANTUS SOLOSTAR 100 UNIT/ML Solostar Pen   Subcutaneous   Inject 28 Units into the skin daily.          Marland Kitchen latanoprost (XALATAN) 0.005 % ophthalmic solution   Both Eyes   Place 1 drop into both eyes at bedtime.         . methylphenidate (RITALIN) 5 MG tablet   Oral   Take 5 mg by mouth 2 (two) times daily.         . metoprolol tartrate (LOPRESSOR) 25 MG tablet      TAKE 1 TABLET BY MOUTH TWICE DAILY   60 tablet   0   . Multiple Vitamin (MULTIVITAMIN WITH MINERALS) TABS   Oral   Take 1 tablet by mouth daily.         Marland Kitchen omeprazole (PRILOSEC) 20 MG capsule   Oral   Take 20 mg by mouth 2 (two) times daily.          Marland Kitchen saccharomyces boulardii (FLORASTOR) 250 MG capsule      Take one po BID x 6 weeks   60 capsule   1   . sertraline (ZOLOFT) 50 MG tablet   Oral   Take 50 mg by mouth every evening.         . vancomycin (VANCOCIN) 250 MG capsule      Take 250 mg QID x 2 weeks then TID x 1 week then BID x 1 week then daily x 1 week. May compound liquid   100 capsule   0   . warfarin (COUMADIN) 2.5 MG tablet   Oral   Take 2.5 mg by mouth daily.         Marland Kitchen zolpidem (AMBIEN) 5 MG tablet   Oral   Take 5 mg by mouth at bedtime as needed for sleep.          BP 131/76  Pulse 86  Temp(Src) 97.9 F (36.6 C) (Oral)  Resp 16  Ht 5' (1.524 m)  Wt 147 lb (66.679 kg)  BMI 28.71 kg/m2  SpO2 99% Physical Exam  Constitutional: She is oriented to person, place, and time. She appears well-developed and well-nourished. No distress.  HENT:  Head: Normocephalic and atraumatic.  Nose: Nose normal.  Mouth/Throat: Oropharynx is clear and moist.  Eyes: EOM are  normal. Pupils are  equal, round, and reactive to light.  Neck: Normal range of motion. Neck supple. No tracheal deviation present.  Cardiovascular: Normal heart sounds and intact distal pulses.   irregular tachycardia   Pulmonary/Chest: Effort normal and breath sounds normal. She has no rales.  Abdominal: Soft. Bowel sounds are normal. She exhibits no distension. There is tenderness ( diffuse). There is no rebound and no guarding.  Musculoskeletal: Normal range of motion. She exhibits no tenderness.  Neurological: She is alert and oriented to person, place, and time.  Skin: Skin is warm and dry. No rash noted.  Psychiatric: She has a normal mood and affect. Her behavior is normal.    ED Course  Procedures (including critical care time) Labs Review  Imaging Review Ct Abdomen Pelvis W Contrast  11/23/2013   CLINICAL DATA:  Abdominal pain, on vancomycin for 8 days for an intestinal infection (C difficile colitis 09/21/2013 per EHR), history of hypertension, pancreatitis, hiatal hernia, diabetes, COPD  EXAM: CT ABDOMEN AND PELVIS WITH CONTRAST  TECHNIQUE: Multidetector CT imaging of the abdomen and pelvis was performed using the standard protocol following bolus administration of intravenous contrast. Sagittal and coronal MPR images reconstructed from axial data set.  CONTRAST:  77mL OMNIPAQUE IOHEXOL 300 MG/ML SOLN. Dilute oral contrast.  COMPARISON:  08/06/2012  FINDINGS: Lung bases clear.  Mitral annular calcifications and scattered atherosclerotic calcifications.  Simple appearing cyst upper pole right kidney 4.3 x 3.7 cm image 26 minimally increased.  Liver, spleen, pancreas, kidneys, and adrenal glands otherwise normal appearance.  Stomach decompressed, wall thickness suboptimally assessed.  Appendix and uterus surgically absent with normal sized ovaries.  Unremarkable bladder and ureters.  Scattered sigmoid diverticulosis without evidence of diverticulitis.  Large and small bowel loops otherwise unremarkable.  No  mass, adenopathy, free fluid, or inflammatory process.  Bones demineralized.  IMPRESSION: Sigmoid diverticulosis.  Minimal increase in size of a right renal cyst since 2013.  No acute intra-abdominal or intrapelvic abnormalities.   Electronically Signed   By: Lavonia Dana M.D.   On: 11/23/2013 16:27    EKG Interpretation   None      Results for orders placed during the hospital encounter of 11/23/13  CBC WITH DIFFERENTIAL      Result Value Ref Range   WBC 11.6 (*) 4.0 - 10.5 K/uL   RBC 5.28 (*) 3.87 - 5.11 MIL/uL   Hemoglobin 16.6 (*) 12.0 - 15.0 g/dL   HCT 46.3 (*) 36.0 - 46.0 %   MCV 87.7  78.0 - 100.0 fL   MCH 31.4  26.0 - 34.0 pg   MCHC 35.9  30.0 - 36.0 g/dL   RDW 13.7  11.5 - 15.5 %   Platelets 254  150 - 400 K/uL   Neutrophils Relative % 78 (*) 43 - 77 %   Neutro Abs 9.1 (*) 1.7 - 7.7 K/uL   Lymphocytes Relative 17  12 - 46 %   Lymphs Abs 1.9  0.7 - 4.0 K/uL   Monocytes Relative 5  3 - 12 %   Monocytes Absolute 0.5  0.1 - 1.0 K/uL   Eosinophils Relative 0  0 - 5 %   Eosinophils Absolute 0.0  0.0 - 0.7 K/uL   Basophils Relative 0  0 - 1 %   Basophils Absolute 0.0  0.0 - 0.1 K/uL  COMPREHENSIVE METABOLIC PANEL      Result Value Ref Range   Sodium 141  137 - 147 mEq/L   Potassium 4.2  3.7 - 5.3 mEq/L   Chloride 104  96 - 112 mEq/L   CO2 15 (*) 19 - 32 mEq/L   Glucose, Bld 143 (*) 70 - 99 mg/dL   BUN 22  6 - 23 mg/dL   Creatinine, Ser 1.02  0.50 - 1.10 mg/dL   Calcium 9.7  8.4 - 10.5 mg/dL   Total Protein 8.2  6.0 - 8.3 g/dL   Albumin 4.1  3.5 - 5.2 g/dL   AST 37  0 - 37 U/L   ALT 26  0 - 35 U/L   Alkaline Phosphatase 81  39 - 117 U/L   Total Bilirubin 0.7  0.3 - 1.2 mg/dL   GFR calc non Af Amer 50 (*) >90 mL/min   GFR calc Af Amer 58 (*) >90 mL/min  LIPASE, BLOOD      Result Value Ref Range   Lipase 57  11 - 59 U/L  URINALYSIS, ROUTINE W REFLEX MICROSCOPIC      Result Value Ref Range   Color, Urine YELLOW  YELLOW   APPearance CLOUDY (*) CLEAR   Specific  Gravity, Urine 1.017  1.005 - 1.030   pH 6.0  5.0 - 8.0   Glucose, UA NEGATIVE  NEGATIVE mg/dL   Hgb urine dipstick SMALL (*) NEGATIVE   Bilirubin Urine NEGATIVE  NEGATIVE   Ketones, ur NEGATIVE  NEGATIVE mg/dL   Protein, ur 100 (*) NEGATIVE mg/dL   Urobilinogen, UA 0.2  0.0 - 1.0 mg/dL   Nitrite POSITIVE (*) NEGATIVE   Leukocytes, UA LARGE (*) NEGATIVE  PROTIME-INR      Result Value Ref Range   Prothrombin Time 20.1 (*) 11.6 - 15.2 seconds   INR 1.77 (*) 0.00 - 1.49  URINE MICROSCOPIC-ADD ON      Result Value Ref Range   Squamous Epithelial / LPF FEW (*) RARE   WBC, UA TOO NUMEROUS TO COUNT  <3 WBC/hpf   RBC / HPF 3-6  <3 RBC/hpf   Bacteria, UA MANY (*) RARE  I-STAT CG4 LACTIC ACID, ED      Result Value Ref Range   Lactic Acid, Venous 2.30 (*) 0.5 - 2.2 mmol/L    MDM   Final diagnoses:  Diarrhea  Abdominal pain  UTI (lower urinary tract infection)    80 yo F in NAD AFVSS presents with couple weeks of diarrhea 2/2 c diff infection. Currently on po vancomycin day 8. She is here with worsening diarrhea and abdominal pain. ABD is described as cramping. Diarrhea is nb. Nausea but no emesis. No appetite.   3:43 PM repeat abd exam with no peritonitis. UA with UTI. First dose of rocephin given in ED. CT abd pending. Plan to admit once CT scan results available. NS bolus given. Patient refused pain medication.   Case discussed with my attending Dr. Vanita Panda.   Case discussed with Dr. Wendee Beavers plan to admit patient for further care.   Family and patient up dated multiple times.    Ruthell Rummage, MD 11/23/13 [phone]

## 2013-11-23 NOTE — ED Notes (Signed)
Lactic acid results given to Dr. Vanita Panda

## 2013-11-24 ENCOUNTER — Encounter (HOSPITAL_COMMUNITY): Payer: Self-pay | Admitting: Physician Assistant

## 2013-11-24 DIAGNOSIS — N390 Urinary tract infection, site not specified: Secondary | ICD-10-CM

## 2013-11-24 DIAGNOSIS — A0472 Enterocolitis due to Clostridium difficile, not specified as recurrent: Secondary | ICD-10-CM

## 2013-11-24 DIAGNOSIS — E119 Type 2 diabetes mellitus without complications: Secondary | ICD-10-CM

## 2013-11-24 DIAGNOSIS — I4891 Unspecified atrial fibrillation: Secondary | ICD-10-CM

## 2013-11-24 DIAGNOSIS — R197 Diarrhea, unspecified: Secondary | ICD-10-CM

## 2013-11-24 LAB — BASIC METABOLIC PANEL
BUN: 16 mg/dL (ref 6–23)
CO2: 16 mEq/L — ABNORMAL LOW (ref 19–32)
Calcium: 8.6 mg/dL (ref 8.4–10.5)
Chloride: 107 mEq/L (ref 96–112)
Creatinine, Ser: 0.96 mg/dL (ref 0.50–1.10)
GFR calc non Af Amer: 54 mL/min — ABNORMAL LOW (ref >90)
GFR, EST AFRICAN AMERICAN: 63 mL/min — AB (ref >90)
Glucose, Bld: 79 mg/dL (ref 70–99)
POTASSIUM: 3.7 meq/L (ref 3.7–5.3)
SODIUM: 140 meq/L (ref 137–147)

## 2013-11-24 LAB — GLUCOSE, CAPILLARY
GLUCOSE-CAPILLARY: 158 mg/dL — AB (ref 70–99)
Glucose-Capillary: 95 mg/dL (ref 70–99)
Glucose-Capillary: 98 mg/dL (ref 70–99)
Glucose-Capillary: 139 mg/dL — ABNORMAL HIGH (ref 70–99)

## 2013-11-24 LAB — CBC
HCT: 41.7 % (ref 36.0–46.0)
HEMOGLOBIN: 14.5 g/dL (ref 12.0–15.0)
MCH: 31 pg (ref 26.0–34.0)
MCHC: 34.8 g/dL (ref 30.0–36.0)
MCV: 89.3 fL (ref 78.0–100.0)
Platelets: 224 10*3/uL (ref 150–400)
RBC: 4.67 MIL/uL (ref 3.87–5.11)
RDW: 13.9 % (ref 11.5–15.5)
WBC: 12.4 10*3/uL — ABNORMAL HIGH (ref 4.0–10.5)

## 2013-11-24 LAB — PROTIME-INR
INR: 1.85 — ABNORMAL HIGH (ref 0.00–1.49)
Prothrombin Time: 20.8 seconds — ABNORMAL HIGH (ref 11.6–15.2)

## 2013-11-24 MED ORDER — VANCOMYCIN 50 MG/ML ORAL SOLUTION
250.0000 mg | Freq: Four times a day (QID) | ORAL | Status: DC
  Administered 2013-11-24 – 2013-11-27 (×9): 250 mg via ORAL
  Filled 2013-11-24 (×16): qty 5

## 2013-11-24 MED ORDER — WARFARIN SODIUM 4 MG PO TABS
4.0000 mg | ORAL_TABLET | Freq: Once | ORAL | Status: AC
  Administered 2013-11-24: 4 mg via ORAL
  Filled 2013-11-24: qty 1

## 2013-11-24 MED ORDER — CHOLESTYRAMINE 4 G PO PACK
4.0000 g | PACK | Freq: Four times a day (QID) | ORAL | Status: DC | PRN
  Filled 2013-11-24: qty 1

## 2013-11-24 NOTE — Progress Notes (Signed)
Inpatient Diabetes Program Recommendations  AACE/ADA: New Consensus Statement on Inpatient Glycemic Control (2013)  Target Ranges:  Prepandial:   less than 140 mg/dL      Peak postprandial:   less than 180 mg/dL (1-2 hours)      Critically ill patients:  140 - 180 mg/dL   Reason for Visit: hypoglycemia  Diabetes history: Type 2 diabetes Outpatient Diabetes medications: Lantus 28 units daily, Amaryl 4 mg daily Current orders for Inpatient glycemic control: Lantus 10 units daily, Novolog MODERATE correction scale TID   Results for KRYSTOL, ROCCO (MRN [id number]) as of 11/24/2013 13:30  Ref. Range 11/23/2013 21:58 11/23/2013 22:39 11/24/2013 08:00 11/24/2013 12:37  Glucose-Capillary Latest Range: 70-99 mg/dL 63 (L) 114 (H) 95 98   CBGs less than 100 mg/dl.  Continue Lantus 10 units daily. Recommend decreasing Novolog correction scale to SENSITIVE TID if CBGs continue to be less than 100 mg/dl.  Note that patient has not received any Novolog since admission per chart. Harvel Ricks RN BSN CDE

## 2013-11-24 NOTE — Progress Notes (Addendum)
ANTICOAGULATION CONSULT NOTE - Follow Up Consult  Pharmacy Consult for Coumadin Indication: atrial fibrillation  Allergies  Allergen Reactions  . Lactose Intolerance (Gi) Nausea And Vomiting    severe stomach pain  . Penicillins Hives and Itching    "haven't used any since 1970's"  . Sulfa Antibiotics Hives and Itching    "haven't used any since 1970's"  . Ciprofloxacin Swelling    Patient Measurements: Height: 5' (152.4 cm) Weight: 146 lb 2.6 oz (66.3 kg) IBW/kg (Calculated) : 45.5  Vital Signs: Temp: 98.8 F (37.1 C) (02/20 0509) Temp src: Oral (02/20 0509) BP: 102/62 mmHg (02/20 0509) Pulse Rate: 71 (02/20 0509)  Labs:  Recent Labs  11/23/13 1306 11/23/13 1351 11/24/13 0500 11/24/13 1005  HGB 16.6*  --  14.5  --   HCT 46.3*  --  41.7  --   PLT 254  --  224  --   LABPROT  --  20.1*  --  20.8*  INR  --  1.77*  --  1.85*  CREATININE 1.02  --  0.96  --     Estimated Creatinine Clearance: 39 ml/min (by C-G formula based on Cr of 0.96).   Assessment: 80 y/o female on chronic Coumadin for Afib. INR is subtherapeutic this morning at 1.85. No bleeding noted, CBC stable. Will watch INR closely with diarrhea.  Goal of Therapy:  INR 2-3 Monitor platelets by anticoagulation protocol: Yes   Plan:  -Repeat Coumadin 4 mg PO tonight -INR daily -Monitor for s/sx of bleeding  [facility], Pharm.D., BCPS Clinical Pharmacist Pager: [phone] 11/24/2013 1:27 PM

## 2013-11-24 NOTE — Progress Notes (Signed)
Utilization review completed.  

## 2013-11-24 NOTE — Progress Notes (Signed)
Patient's care will be taken over by [facility] at 7:00 am on 2/21.  PROGRESS NOTE  [name] [name] PQZ:[id number] DOB: 1929-10-30 DOA: 11/23/2013 PCP: Donnajean Lopes, MD  80 yo female with a pmh of afib, copd, dm II, mitral regurg, and carotid stenosis.  Tested positive on C-diff PCR on both 09/20/13 and 11/14/13.  Appears to be on oral vanc taper.  Presents to the ED with worsening abdominal discomfort.  In the ED was found to have an elevated lactic acid level and UTI.     Assessment/Plan:  UTI Started on Rocephin-continue for 3 days and then stop Cultures pending  C-diff colitis. Patient also has a history of chronic diarrhea. Patient is on day 9 of oral vancomycin.  IV flagyl added on admission.  New c-diff PCR pending. Patient sees [facility], they have been consulted.  Metabolic Acidosis Elevated lactic acid.  Most likely infection. Bicarb slowly trending up with IVF and antibiotics.  DM II Holding oral hyperglycemics Decrease basal insulin dose. CBGs controlled thus far.  HTN (hypertension)  Will continue diltiazem and metoprolol BP well controlled  Hypercholesteremia  Stable   Glaucoma (increased eye pressure)  Stable continue home regimen   Atrial fibrillation  Coumadin per pharmacy  metoprolol and diltiazem   Depression/Anxiety  Stable continue Zoloft and alprazolam  DVT Prophylaxis:  heparin  Code Status: full Family Communication:  Disposition Plan: inpatient.  [place] will assume [name]'s care on 2/21   Consultants:  [facility]  Procedures:    Antibiotics: Anti-infectives   Start     Dose/Rate Route Frequency Ordered Stop   11/23/13 2100  cefTRIAXone (ROCEPHIN) 1 g in dextrose 5 % 50 mL IVPB     1 g 100 mL/hr over 30 Minutes Intravenous Every 24 hours 11/23/13 2022 11/26/13 2059   11/23/13 2000  vancomycin (VANCOCIN) 50 mg/mL oral solution 500 mg     500 mg Oral 4 times per day  11/23/13 1947 12/07/13 1759   11/23/13 1947  metroNIDAZOLE (FLAGYL) IVPB 500 mg     500 mg 100 mL/hr over 60 Minutes Intravenous Every 8 hours 11/23/13 1947 12/07/13 1959   11/23/13 1715  cefTRIAXone (ROCEPHIN) 1 g in dextrose 5 % 50 mL IVPB     1 g 100 mL/hr over 30 Minutes Intravenous  Once 11/23/13 1706 11/23/13 1855   11/23/13 1545  ciprofloxacin (CIPRO) IVPB 400 mg  Status:  Discontinued     400 mg 200 mL/hr over 60 Minutes Intravenous  Once 11/23/13 1537 11/23/13 1648       HPI/Subjective: Patient reports she has had recurrent pancreatitis of unknown cause and is currently having intermittent right sided abdominal pain.  Objective: Filed Vitals:   11/23/13 1956 11/23/13 2122 11/24/13 0139 11/24/13 0509  BP: 150/87  111/69 102/62  Pulse: 85 80 94 71  Temp: 97.5 F (36.4 C)   98.8 F (37.1 C)  TempSrc: Oral   Oral  Resp: 18 18  18   Height: 5' (1.524 m)     Weight: 66.3 kg (146 lb 2.6 oz)     SpO2: 96%   94%   No intake or output data in the 24 hours ending 11/24/13 1159 Filed Weights   11/23/13 1302 11/23/13 1956  Weight: 66.679 kg (147 lb) 66.3 kg (146 lb 2.6 oz)    Exam: General: Well developed, well nourished, NAD, appears stated age, pleasant. HEENT:  PERR, EOMI, Anicteic Sclera, MMM. No pharyngeal erythema or exudates  Neck: Supple,  no JVD, no masses  Cardiovascular: RRR, S1 S2 auscultated, no rubs, murmurs or gallops.   Respiratory: Clear to auscultation bilaterally with equal chest rise  Abdomen: obese, Soft, nontender, nondistended, + bowel sounds  Extremities: warm dry without cyanosis clubbing or edema.  Neuro: AAOx3, cranial nerves grossly intact. Strength 5/5 in upper and lower extremities  Skin: Without rashes exudates or nodules.   Psych: Normal affect and demeanor with intact judgement and insight       Data Reviewed: Basic Metabolic Panel:  Recent Labs Lab 11/23/13 1306 11/24/13 0500  NA 141 140  K 4.2 3.7  CL 104 107  CO2 15* 16*    GLUCOSE 143* 79  BUN 22 16  CREATININE 1.02 0.96  CALCIUM 9.7 8.6   Liver Function Tests:  Recent Labs Lab 11/23/13 1306  AST 37  ALT 26  ALKPHOS 81  BILITOT 0.7  PROT 8.2  ALBUMIN 4.1    Recent Labs Lab 11/23/13 1306  LIPASE 57   CBC:  Recent Labs Lab 11/23/13 1306 11/24/13 0500  WBC 11.6* 12.4*  NEUTROABS 9.1*  --   HGB 16.6* 14.5  HCT 46.3* 41.7  MCV 87.7 89.3  PLT 254 224   BNP (last 3 results)  Recent Labs  10/12/13 1527  PROBNP 114.0*   CBG:  Recent Labs Lab 11/23/13 2158 11/23/13 2239 11/24/13 0800  GLUCAP 63* 114* 95    Recent Results (from the past 240 hour(s))  CLOSTRIDIUM DIFFICILE BY PCR     Status: Abnormal   Collection Time    11/14/13 12:46 PM      Result Value Ref Range Status   C difficile by pcr Detected (*) Not Detected Final   Comment:       This assay detects the presence of Clostridium difficile DNA coding     for toxin B (tcdB) by real-time polymerase chain reaction (PCR)     amplification.     This test was developed and its performance characteristics have been     determined by [employer]. Performance characteristics refer     to the analytical performance of the test. This test has not been     cleared or approved by the [place] Food and Drug Administration. The FDA     has determined that such clearance or approval is not necessary. This     laboratory is certified under the [place] as qualified to perform high complexity clinical     laboratory testing.  URINE CULTURE     Status: None   Collection Time    11/23/13  2:31 PM      Result Value Ref Range Status   Specimen Description URINE, RANDOM   Final   Special Requests ADDED AT [id number] ON [id number]   Final   Culture  Setup Time     Final   Value: 11/23/2013 18:58     Performed at [facility]     Final   Value: >=100,000 COLONIES/ML     Performed at [facility]   Culture      Final   Value: ESCHERICHIA COLI     Performed at [facility]   Report Status PENDING   Incomplete     Studies: Ct Abdomen Pelvis W Contrast  11/23/2013   CLINICAL DATA:  Abdominal pain, on vancomycin for 8 days for an intestinal infection (C difficile colitis 09/21/2013 per EHR), history  of hypertension, pancreatitis, hiatal hernia, diabetes, COPD  EXAM: CT ABDOMEN AND PELVIS WITH CONTRAST  TECHNIQUE: Multidetector CT imaging of the abdomen and pelvis was performed using the standard protocol following bolus administration of intravenous contrast. Sagittal and coronal MPR images reconstructed from axial data set.  CONTRAST:  20mL OMNIPAQUE IOHEXOL 300 MG/ML SOLN. Dilute oral contrast.  COMPARISON:  08/06/2012  FINDINGS: Lung bases clear.  Mitral annular calcifications and scattered atherosclerotic calcifications.  Simple appearing cyst upper pole right kidney 4.3 x 3.7 cm image 26 minimally increased.  Liver, spleen, pancreas, kidneys, and adrenal glands otherwise normal appearance.  Stomach decompressed, wall thickness suboptimally assessed.  Appendix and uterus surgically absent with normal sized ovaries.  Unremarkable bladder and ureters.  Scattered sigmoid diverticulosis without evidence of diverticulitis.  Large and small bowel loops otherwise unremarkable.  No mass, adenopathy, free fluid, or inflammatory process.  Bones demineralized.  IMPRESSION: Sigmoid diverticulosis.  Minimal increase in size of a right renal cyst since 2013.  No acute intra-abdominal or intrapelvic abnormalities.   Electronically Signed   By: Lavonia Dana M.D.   On: 11/23/2013 16:27    Scheduled Meds: . albuterol  3 mL Nebulization BID  . cefTRIAXone (ROCEPHIN)  IV  1 g Intravenous Q24H  . diltiazem  120 mg Oral Daily  . heparin  5,000 Units Subcutaneous 3 times per day  . insulin aspart  0-15 Units Subcutaneous TID WC  . insulin glargine  10 Units Subcutaneous QHS  . latanoprost  1 drop Both Eyes QHS  .  metoprolol tartrate  25 mg Oral BID  . vancomycin  500 mg Oral 4 times per day   And  . metronidazole  500 mg Intravenous Q8H  . multivitamin with minerals  1 tablet Oral Daily  . saccharomyces boulardii  250 mg Oral BID  . sertraline  50 mg Oral QPM  . sodium chloride  3 mL Intravenous Q12H  . Warfarin - Pharmacist Dosing Inpatient   Does not apply q1800   Continuous Infusions: . sodium chloride 75 mL/hr at 11/23/13 2037    Principal Problem:   UTI (lower urinary tract infection) Active Problems:   C. difficile colitis   Metabolic acidosis   DM II (diabetes mellitus, type II), controlled   HTN (hypertension)   Hypercholesteremia   Glaucoma (increased eye pressure)   Atrial fibrillation    Depression   Anxiety    Karen Kitchens  [facility] Pager [phone]. If 7PM-7AM, please contact night-coverage at [email or web address], password [facility] 11/24/2013, 11:59 AM  LOS: 1 day   Attending Seen examined, agree with the above assessment and plan. Unfortunately seems like the patient has developed a UTI with ongoing C. difficile colitis. We'll plan a three-day course of IV Rocephin, continue with current treatment of C. difficile. We'll ask GI to consult and provide further recommendations.  Nena Alexander MD

## 2013-11-24 NOTE — Consult Note (Signed)
[facility] Consult: 1:31 PM 11/24/2013  LOS: 1 day    Referring Provider: Sloan Leiter  Primary Care Physician:  Donnajean Lopes, MD Primary Gastroenterologist:  Dr. Carlean Purl     Reason for Consultation:    C diff managent.    HPI: [name] [name] is a 80 y.o. female.  Chronic Coumadin for A fib. Hx of pancreatitis of unclear cause including 2013 post ERCP pancreatitis. Hx SIRS 04/2012, recurrent UTIs, type 2 DM.   Chronic loose stools, diagnosed by + PCR with C diff 09/20/2013 after office consult with Dr Carlean Purl.  Initial Rx: Metronidazole and Florastor.  Switched to Vanco 250 mg qid after one week due to lack of improvement at cost of $1000 out of pocket, completed 10/10/13.  On 10/09/13 ROV was doing better:  2 daily BMs (her baseline) compared to apex of 8 to 9 daily loose stools.  She was to finish additional 2 weeks of Florastor (through end of January). On 2/9 contacted GI with increased stool frequency, loose stools, most notable post prandial but nocturnal occurrence as well. About a week prior to this flare she had developed urinary frequency and incontinence.  GI (by phone) restarted Florastor 2/9.  On 2/10 C diff PCR was again +and 5 week vancomycin taper (liquid which is less costly) added. GI follow up arranged for 3/31. Was advised clear diet for abd pain on 2/13, go to ED if worsening.   Admitted to hospital 2/19 with abd pain, nausea, dysuria.  Workup revealed lactic acidosis, normal lipase (hx of pancreatitis) and LFTs, WBCs 11.6, normal renal function. Urine strongly + for UTI and growing E coli, started on Metronidazole IV, Rocephin and Vanco dose increased to 500 mg QID.  CT abdomen with contrast shows no colitis, + sigmoid diverticulosis, renal cyst,   Pt and her attentive husband say that within  48 hours of starting the oral Vancomycin on 2/10, the stool frequency had improved and stool was soft and no longer watery or loose.  Unfortunately she developed the abd pain across her lower abdomen/pelvic region along with urinary frequency and incontinence.  Last BM was around 8 AM today and was soft, pudding consistency.  Had similar stool at midnight and 10 PM yesterday.  Overall this represents a significant, sustained improvement.  Pain has migrated to RUQ and into back but Lipase/LFTs normal.     Past Medical History  Diagnosis Date  . Pancreatitis     ~2008, 11/2011 post ERCP  . Hypertension   . Glaucoma   . High cholesterol   . GERD (gastroesophageal reflux disease)     with HH  . Atrial fibrillation     RVR hx.  Chronic Coumadin  . Pneumonia 12/2011    "first time I know about"  . Type II diabetes mellitus   . Depression   . Chronic lower back pain   . COPD (chronic obstructive pulmonary disease)   . HCAP (healthcare-associated pneumonia) 11/21/2011  . Mitral regurgitation 03/11/2012  . Metabolic encephalopathy 0000000  . E-coli UTI 11/13/2011, 11/2013  . SIRS (systemic  inflammatory response syndrome) 03/13/2012    a/w post ERCP  pancreatitis.   . C. difficile colitis 09/21/2013, 11/2013  . Carotid stenosis     a. Carotid US (10/2013):  bilat 1-39%; f/u 1 year    Past Surgical History  Procedure Laterality Date  . Ercp  11/13/2011    Procedure: ENDOSCOPIC RETROGRADE CHOLANGIOPANCREATOGRAPHY (ERCP);  Surgeon: Beryle Beams, MD;  Location: [facility];  Service: Endoscopy;  Laterality: N/A;  . Tonsillectomy and adenoidectomy  ~ 1939  . Cholecystectomy  1990's    Lap chole  . Appendectomy  ~ 1960  . Vaginal hysterectomy  ~ 1960's  . Dilation and curettage of uterus    . Cataract extraction w/ intraocular lens  implant, bilateral  2000's  . Colonoscopy  01/2003    diverticulosis, 33mm hyperplastic sigmoid polyp    Prior to Admission medications   Medication Sig Start Date End  Date Taking? Authorizing Provider  albuterol (ACCUNEB) 0.63 MG/3ML nebulizer solution Take 1 ampule by nebulization 2 (two) times daily.   Yes Historical Provider, MD  ALPRAZolam Duanne Moron) 1 MG tablet Take 0.5 tablets (0.5 mg total) by mouth 4 (four) times daily as needed for anxiety. For sleep or anxiety. 04/18/12  Yes Srikar Janna Arch, MD  diltiazem (CARDIZEM CD) 120 MG 24 hr capsule Take 1 capsule (120 mg total) by mouth daily. 05/24/13 05/24/14 Yes Minus Breeding, MD  glimepiride (AMARYL) 4 MG tablet Take 4 mg by mouth daily with breakfast.    Yes Historical Provider, MD  HYDROcodone-acetaminophen (VICODIN) 5-500 MG per tablet Take 1 tablet by mouth every 6 (six) hours as needed for pain.   Yes Historical Provider, MD  LANTUS SOLOSTAR 100 UNIT/ML Solostar Pen Inject 28 Units into the skin daily.  09/01/13  Yes Historical Provider, MD  latanoprost (XALATAN) 0.005 % ophthalmic solution Place 1 drop into both eyes at bedtime.   Yes Historical Provider, MD  methylphenidate (RITALIN) 5 MG tablet Take 5 mg by mouth 2 (two) times daily.   Yes Historical Provider, MD  metoprolol tartrate (LOPRESSOR) 25 MG tablet TAKE 1 TABLET BY MOUTH TWICE DAILY 08/26/13  Yes Minus Breeding, MD  Multiple Vitamin (MULTIVITAMIN WITH MINERALS) TABS Take 1 tablet by mouth daily.   Yes Historical Provider, MD  omeprazole (PRILOSEC) 20 MG capsule Take 20 mg by mouth 2 (two) times daily.    Yes Historical Provider, MD  saccharomyces boulardii (FLORASTOR) 250 MG capsule Take one po BID x 6 weeks 11/15/13  Yes Amy S Esterwood, PA-C  sertraline (ZOLOFT) 50 MG tablet Take 50 mg by mouth every evening.   Yes Historical Provider, MD  vancomycin (VANCOCIN) 250 MG capsule Take 250 mg QID x 2 weeks then TID x 1 week then BID x 1 week then daily x 1 week. May compound liquid 11/15/13  Yes Amy S Esterwood, PA-C  warfarin (COUMADIN) 2.5 MG tablet Take 2.5 mg by mouth daily.   Yes Historical Provider, MD  zolpidem (AMBIEN) 5 MG tablet Take 5 mg by  mouth at bedtime as needed for sleep.   Yes Historical Provider, MD    Scheduled Meds: . albuterol  3 mL Nebulization BID  . cefTRIAXone (ROCEPHIN)  IV  1 g Intravenous Q24H  . diltiazem  120 mg Oral Daily  . heparin  5,000 Units Subcutaneous 3 times per day  . insulin aspart  0-15 Units Subcutaneous TID WC  . insulin glargine  10 Units Subcutaneous QHS  . latanoprost  1 drop Both Eyes  QHS  . metoprolol tartrate  25 mg Oral BID  . multivitamin with minerals  1 tablet Oral Daily  . saccharomyces boulardii  250 mg Oral BID  . sertraline  50 mg Oral QPM  . sodium chloride  3 mL Intravenous Q12H  . vancomycin  250 mg Oral 4 times per day  . warfarin  4 mg Oral ONCE-1800  . Warfarin - Pharmacist Dosing Inpatient   Does not apply q1800   Infusions: . sodium chloride 1,000 mL (11/24/13 1226)   PRN Meds: ALPRAZolam, HYDROcodone-acetaminophen, ondansetron (ZOFRAN) IV, ondansetron, zolpidem   Allergies as of 11/23/2013 - Review Complete 11/23/2013  Allergen Reaction Noted  . Lactose intolerance (gi) Nausea And Vomiting 11/13/2011  . Penicillins Hives and Itching 11/12/2011  . Sulfa antibiotics Hives and Itching 11/12/2011  . Ciprofloxacin Swelling 11/23/2013    Family History  Problem Relation Age of Onset  . Anesthesia problems Neg Hx   . Hypotension Neg Hx   . Malignant hyperthermia Neg Hx   . Pseudochol deficiency Neg Hx   . Stroke Father 35  . Stroke Mother 19    History   Social History  . Marital Status: Married.  Husband looks younger than pt and is very attentive.     Spouse Name: N/A    Number of Children: N/A  . Years of Education: N/A   Social History Main Topics  . Smoking status: Never Smoker   . Smokeless tobacco: Never Used  . Alcohol Use: No  . Drug Use: No  . Sexual Activity: Not Currently   Social History Narrative   Lives at home with her husband.   No children    REVIEW OF SYSTEMS: Constitutional:  + weakness, + deconditioning.  Ambulates  with walker, no exercise.  ENT:  No nose bleeds, no congestion Pulm:  No cough or SOB CV:  No palpitations, no LE edema.  GU:  No hematuria, no frequency GI:  Per HPI.  No dysphagia, no regurgitation.  Heme:  No iron therapy, no excessive bleeding or bruising.    Transfusions:  None  Neuro:  No headaches, no peripheral tingling or numbness.  No unilateral weakness.  + balance issues and gait instability Derm:  No itching, no rash or sores.  Endocrine:  No sweats or chills.  No polyuria or dysuria Psych:  Periodic anxiety with shaking.  Immunization:  Current with flu, pneumovax.  Travel:  None.  Stays close to home.    PHYSICAL EXAM: Vital signs in last 24 hours: Filed Vitals:   11/24/13 0509  BP: 102/62  Pulse: 71  Temp: 98.8 F (37.1 C)  Resp: 18   Wt Readings from Last 3 Encounters:  11/23/13 66.3 kg (146 lb 2.6 oz)  10/12/13 68.402 kg (150 lb 12.8 oz)  10/09/13 68.493 kg (151 lb)   General: elderly WF who looks moderately chronically ill but not uncomfortable or acutely ill.  Head:  No asymmetry or swelling.  No trauma  Eyes:  No icterus or pallor Ears:  Not HOH.  Nose:  No discharge or congestion Mouth:  Moist, clear, good dental repair with some missing teeth.  Neck:  No mass, no JVD.  Lungs:  Clear bil.  No cough or dyspnea. Heart: RRR.  No MRG. Abdomen:  Soft, NT, ND.  No mass or HSM.  No bruits.  BS active.  Well healed incision scars down midline and in RLQ Rectal: deferred   Musc/Skeltl: some arthritic deformity in knees Extremities:  No pedal  or LE edema.  Neurologic:  No tremor, no asterixis.  Limb strength grossly intact and symmetric Skin:  No rash or sores.  50 cm round bruise in right LQ Tattoos:  none Nodes:  No cervical adenopathy   Psych:  Pleasant, oriented x 3.  Relaxed.   Intake/Output from previous day:   Intake/Output this shift:    LAB RESULTS:  Recent Labs  11/23/13 1306 11/24/13 0500  WBC 11.6* 12.4*  HGB 16.6* 14.5  HCT 46.3*  41.7  PLT 254 224   BMET Lab Results  Component Value Date   NA 140 11/24/2013   NA 141 11/23/2013   NA 139 10/12/2013   K 3.7 11/24/2013   K 4.2 11/23/2013   K 4.2 10/12/2013   CL 107 11/24/2013   CL 104 11/23/2013   CL 108 10/12/2013   CO2 16* 11/24/2013   CO2 15* 11/23/2013   CO2 22 10/12/2013   GLUCOSE 79 11/24/2013   GLUCOSE 143* 11/23/2013   GLUCOSE 146* 10/12/2013   BUN 16 11/24/2013   BUN 22 11/23/2013   BUN 20 10/12/2013   CREATININE 0.96 11/24/2013   CREATININE 1.02 11/23/2013   CREATININE 1.1 10/12/2013   CALCIUM 8.6 11/24/2013   CALCIUM 9.7 11/23/2013   CALCIUM 9.4 10/12/2013   LFT  Recent Labs  11/23/13 1306  PROT 8.2  ALBUMIN 4.1  AST 37  ALT 26  ALKPHOS 81  BILITOT 0.7   PT/INR Lab Results  Component Value Date   INR 1.85* 11/24/2013   C-Diff   Positive: 09/20/13, 11/14/13   Lipase     Component Value Date/Time   LIPASE 57 11/23/2013 1306    RADIOLOGY STUDIES: Ct Abdomen Pelvis W Contrast  11/23/2013   CLINICAL DATA:  Abdominal pain, on vancomycin for 8 days for an intestinal infection (C difficile colitis 09/21/2013 per EHR), history of hypertension, pancreatitis, hiatal hernia, diabetes, COPD  EXAM: CT ABDOMEN AND PELVIS WITH CONTRAST  TECHNIQUE: Multidetector CT imaging of the abdomen and pelvis was performed using the standard protocol following bolus administration of intravenous contrast. Sagittal and coronal MPR images reconstructed from axial data set.  CONTRAST:  60mL OMNIPAQUE IOHEXOL 300 MG/ML SOLN. Dilute oral contrast.  COMPARISON:  08/06/2012  FINDINGS: Lung bases clear.  Mitral annular calcifications and scattered atherosclerotic calcifications.  Simple appearing cyst upper pole right kidney 4.3 x 3.7 cm image 26 minimally increased.  Liver, spleen, pancreas, kidneys, and adrenal glands otherwise normal appearance.  Stomach decompressed, wall thickness suboptimally assessed.  Appendix and uterus surgically absent with normal sized ovaries.  Unremarkable bladder  and ureters.  Scattered sigmoid diverticulosis without evidence of diverticulitis.  Large and small bowel loops otherwise unremarkable.  No mass, adenopathy, free fluid, or inflammatory process.  Bones demineralized.  IMPRESSION: Sigmoid diverticulosis.  Minimal increase in size of a right renal cyst since 2013.  No acute intra-abdominal or intrapelvic abnormalities.   Electronically Signed   By: Lavonia Dana M.D.   On: 11/23/2013 16:27    ENDOSCOPIC STUDIES: 11/2011   ERCP by Dr Benson Norway for lipase 600 and MRCP suggesting CBD stone Small sphincterotomy associated with small tear.  Possible CBD filling defect that resolved, no retained stone.  Plain x ray did not show evidence of tear  01/2003  Colonoscopy by Dr Carlean Purl routine screening Diverticulosis ascending to sigmoid.  3 mm sigmoid polyp, pathology: hyperplastic  IMPRESSION:   *  C diff PCR + colitis, recurrent.  Failed outpt flagyl po, improved after 2 weeks oral  vanco but relapsed, started on Vanco po taper and stools once again improved. . *  E coli UTI, unfortunately requires Rocephin.  *  Chronic Coumadin *  Hx of pancreatitis.  Has current and intermittent RUQ pain.  Lipase and LFTs normal.  GB is out.    PLAN:     *  Resume the outpt dose of PO Vanco 250 mg QID. Continue at this dose.  Do not begin taper until 7 days after UTI abx completed.  Then taper with 250 mg 3 x daily x one week, then twice daily x one week, then once daily x one week. Also restart Florastor twice daily x 6 weeks. I went ahead and stopped the Metronidazole. Given her overall improved diarrhea do not think this is warranted.   Could add Questran if diarrhea flares.  I added this Prn to orders.  Not necessary to repeat C diff PCR as it is likely to remain + for up to a few months and another + test will not change management. I cancelled C diff orders Dr Deatra Ina will see pt.    Azucena Freed  11/24/2013, 1:31 PM Pager: [phone]  GI Attending Note   Chart  was reviewed and patient was examined. X-rays and lab were reviewed.    I agree with management and plans. [facility] is improving altough it has to be watched carefully since she is receiving rocephin.  I'm uncertain what is causing her abdominal pain (could be UTI or [facility]) but it appears to be improving as well.    Sandy Salaam. Deatra Ina, M.D., [facility] Cell [phone]

## 2013-11-25 ENCOUNTER — Other Ambulatory Visit: Payer: Self-pay | Admitting: Cardiology

## 2013-11-25 LAB — URINE CULTURE: Colony Count: >=100000

## 2013-11-25 LAB — BASIC METABOLIC PANEL
BUN: 12 mg/dL (ref 6–23)
CHLORIDE: 108 meq/L (ref 96–112)
CO2: 21 meq/L (ref 19–32)
Calcium: 8.7 mg/dL (ref 8.4–10.5)
Creatinine, Ser: 0.98 mg/dL (ref 0.50–1.10)
GFR calc Af Amer: 61 mL/min — ABNORMAL LOW (ref >90)
GFR, EST NON AFRICAN AMERICAN: 53 mL/min — AB (ref >90)
Glucose, Bld: 115 mg/dL — ABNORMAL HIGH (ref 70–99)
POTASSIUM: 3.9 meq/L (ref 3.7–5.3)
SODIUM: 142 meq/L (ref 137–147)

## 2013-11-25 LAB — CBC
HEMATOCRIT: 41.2 % (ref 36.0–46.0)
HEMOGLOBIN: 14.2 g/dL (ref 12.0–15.0)
MCH: 30.9 pg (ref 26.0–34.0)
MCHC: 34.5 g/dL (ref 30.0–36.0)
MCV: 89.6 fL (ref 78.0–100.0)
Platelets: 204 10*3/uL (ref 150–400)
RBC: 4.6 MIL/uL (ref 3.87–5.11)
RDW: 13.8 % (ref 11.5–15.5)
WBC: 10.1 10*3/uL (ref 4.0–10.5)

## 2013-11-25 LAB — GLUCOSE, CAPILLARY
GLUCOSE-CAPILLARY: 116 mg/dL — AB (ref 70–99)
GLUCOSE-CAPILLARY: 136 mg/dL — AB (ref 70–99)
GLUCOSE-CAPILLARY: 123 mg/dL — AB (ref 70–99)
Glucose-Capillary: 111 mg/dL — ABNORMAL HIGH (ref 70–99)
Glucose-Capillary: 112 mg/dL — ABNORMAL HIGH (ref 70–99)

## 2013-11-25 LAB — PROTIME-INR
INR: 2.11 — ABNORMAL HIGH (ref 0.00–1.49)
Prothrombin Time: 23 seconds — ABNORMAL HIGH (ref 11.6–15.2)

## 2013-11-25 MED ORDER — ALPRAZOLAM 0.5 MG PO TABS
0.5000 mg | ORAL_TABLET | Freq: Every day | ORAL | Status: DC
  Administered 2013-11-25 – 2013-11-26 (×2): 0.5 mg via ORAL
  Filled 2013-11-25 (×2): qty 1

## 2013-11-25 MED ORDER — WARFARIN SODIUM 3 MG PO TABS
3.0000 mg | ORAL_TABLET | Freq: Once | ORAL | Status: AC
  Administered 2013-11-25: 3 mg via ORAL
  Filled 2013-11-25 (×2): qty 1

## 2013-11-25 MED ORDER — INSULIN ASPART 100 UNIT/ML ~~LOC~~ SOLN
0.0000 [IU] | Freq: Three times a day (TID) | SUBCUTANEOUS | Status: DC
Start: 2013-11-25 — End: 2013-11-27
  Administered 2013-11-25 – 2013-11-27 (×6): 1 [IU] via SUBCUTANEOUS

## 2013-11-25 MED ORDER — INSULIN GLARGINE 100 UNIT/ML ~~LOC~~ SOLN
6.0000 [IU] | Freq: Every day | SUBCUTANEOUS | Status: DC
  Administered 2013-11-25 – 2013-11-26 (×2): 6 [IU] via SUBCUTANEOUS
  Filled 2013-11-25 (×3): qty 0.06

## 2013-11-25 NOTE — Progress Notes (Signed)
Subjective: Continues to have moderate diarrhea, no dysuria, had brief anxiety and confusion last night that resolved with ativan.   Objective: Vital signs in last 24 hours: Temp:  [97.2 F (36.2 C)-98.3 F (36.8 C)] 98.3 F (36.8 C) (02/21 0601) Pulse Rate:  [78-85] 78 (02/21 0601) Resp:  [18] 18 (02/21 0601) BP: (135-151)/(80-86) 135/80 mmHg (02/21 0601) SpO2:  [96 %-97 %] 97 % (02/21 0813) Weight change:    Intake/Output from previous day:     General appearance: alert, cooperative and no distress Resp: clear to auscultation bilaterally Cardio: irregularly irregular rhythm GI: soft, non-tender; bowel sounds normal; no masses,  no organomegaly Extremities: bilateral trace leg edema  Lab Results:  Recent Labs  11/24/13 0500 11/25/13 0516  WBC 12.4* 10.1  HGB 14.5 14.2  HCT 41.7 41.2  PLT 224 204   BMET  Recent Labs  11/24/13 0500 11/25/13 0516  NA 140 142  K 3.7 3.9  CL 107 108  CO2 16* 21  GLUCOSE 79 115*  BUN 16 12  CREATININE 0.96 0.98  CALCIUM 8.6 8.7   CMET CMP     Component Value Date/Time   NA 142 11/25/2013 0516   K 3.9 11/25/2013 0516   CL 108 11/25/2013 0516   CO2 21 11/25/2013 0516   GLUCOSE 115* 11/25/2013 0516   BUN 12 11/25/2013 0516   CREATININE 0.98 11/25/2013 0516   CALCIUM 8.7 11/25/2013 0516   PROT 8.2 11/23/2013 1306   ALBUMIN 4.1 11/23/2013 1306   AST 37 11/23/2013 1306   ALT 26 11/23/2013 1306   ALKPHOS 81 11/23/2013 1306   BILITOT 0.7 11/23/2013 1306   GFRNONAA 53* 11/25/2013 0516   GFRAA 61* 11/25/2013 0516    CBG (last 3)   Recent Labs  11/24/13 2126 11/25/13 0026 11/25/13 0825  GLUCAP 139* 111* 116*    INR RESULTS:   Lab Results  Component Value Date   INR 2.11* 11/25/2013   INR 1.85* 11/24/2013   INR 1.77* 11/23/2013     Studies/Results: Ct Abdomen Pelvis W Contrast  11/23/2013   CLINICAL DATA:  Abdominal pain, on vancomycin for 8 days for an intestinal infection (C difficile colitis 09/21/2013 per EHR), history  of hypertension, pancreatitis, hiatal hernia, diabetes, COPD  EXAM: CT ABDOMEN AND PELVIS WITH CONTRAST  TECHNIQUE: Multidetector CT imaging of the abdomen and pelvis was performed using the standard protocol following bolus administration of intravenous contrast. Sagittal and coronal MPR images reconstructed from axial data set.  CONTRAST:  62mL OMNIPAQUE IOHEXOL 300 MG/ML SOLN. Dilute oral contrast.  COMPARISON:  08/06/2012  FINDINGS: Lung bases clear.  Mitral annular calcifications and scattered atherosclerotic calcifications.  Simple appearing cyst upper pole right kidney 4.3 x 3.7 cm image 26 minimally increased.  Liver, spleen, pancreas, kidneys, and adrenal glands otherwise normal appearance.  Stomach decompressed, wall thickness suboptimally assessed.  Appendix and uterus surgically absent with normal sized ovaries.  Unremarkable bladder and ureters.  Scattered sigmoid diverticulosis without evidence of diverticulitis.  Large and small bowel loops otherwise unremarkable.  No mass, adenopathy, free fluid, or inflammatory process.  Bones demineralized.  IMPRESSION: Sigmoid diverticulosis.  Minimal increase in size of a right renal cyst since 2013.  No acute intra-abdominal or intrapelvic abnormalities.   Electronically Signed   By: Lavonia Dana M.D.   On: 11/23/2013 16:27    Medications: I have reviewed the patient's current medications.  Assessment/Plan: #1 UTI: stable on antibiotics and will taper when sensitivities available #2 C. Difficile colitis: stable  on vancomycin #3 DM2:  Sugars too low so insulin dosing will be adjusted.    LOS: 2 days   Dannielle Baskins G 11/25/2013, 10:03 AM

## 2013-11-25 NOTE — Progress Notes (Signed)
RN came to check on patient who expressed some anxiety. Upon talking with patient she made several statements about feeling like "something is going on", mentioned people in the hallways being in gangs, questioned whether lorazepam administered to her was the correct medication, or if the RN was "going to give her something that would do bad things to her". She expressed distrust in the RN and other staff. When asked if she had any physical distress the patient denied it. CBG was checked and found to be 111. Lorazepam was taken, will continue to monitor.

## 2013-11-25 NOTE — Progress Notes (Signed)
Patient been refusing Meds today. She has not taken her Zoloft, Vancomycin, Rocephine

## 2013-11-25 NOTE — Progress Notes (Signed)
Progress Note   Subjective  **Continues to have diarrhea although not worsening*   Objective  Vital signs in last 24 hours: Temp:  [97.2 F (36.2 C)-98.3 F (36.8 C)] 98.3 F (36.8 C) (02/21 0601) Pulse Rate:  [76-85] 76 (02/21 1033) Resp:  [18] 18 (02/21 0601) BP: (135-152)/(79-86) 152/79 mmHg (02/21 1033) SpO2:  [96 %-97 %] 97 % (02/21 0813) Last BM Date: 11/24/13 General:   Alert,  Well-developed,  white female in NAD Heart:  Regular rate and rhythm; no murmurs Abdomen:  Soft, nontender and nondistended. Normal bowel sounds, without guarding, and without rebound.   Extremities:  Without edema. Neurologic:  Alert and  oriented x4;  grossly normal neurologically. Psych:  Alert and cooperative. Normal mood and affect.  Intake/Output from previous day:   Intake/Output this shift:    Lab Results:  Recent Labs  11/23/13 1306 11/24/13 0500 11/25/13 0516  WBC 11.6* 12.4* 10.1  HGB 16.6* 14.5 14.2  HCT 46.3* 41.7 41.2  PLT 254 224 204   BMET  Recent Labs  11/23/13 1306 11/24/13 0500 11/25/13 0516  NA 141 140 142  K 4.2 3.7 3.9  CL 104 107 108  CO2 15* 16* 21  GLUCOSE 143* 79 115*  BUN 22 16 12   CREATININE 1.02 0.96 0.98  CALCIUM 9.7 8.6 8.7   LFT  Recent Labs  11/23/13 1306  PROT 8.2  ALBUMIN 4.1  AST 37  ALT 26  ALKPHOS 81  BILITOT 0.7   PT/INR  Recent Labs  11/24/13 1005 11/25/13 0516  LABPROT 20.8* 23.0*  INR 1.85* 2.11*   Hepatitis Panel No results found for this basename: HEPBSAG, HCVAB, HEPAIGM, HEPBIGM,  in the last 72 hours  Studies/Results: Ct Abdomen Pelvis W Contrast  11/23/2013   CLINICAL DATA:  Abdominal pain, on vancomycin for 8 days for an intestinal infection (C difficile colitis 09/21/2013 per EHR), history of hypertension, pancreatitis, hiatal hernia, diabetes, COPD  EXAM: CT ABDOMEN AND PELVIS WITH CONTRAST  TECHNIQUE: Multidetector CT imaging of the abdomen and pelvis was performed using the standard protocol  following bolus administration of intravenous contrast. Sagittal and coronal MPR images reconstructed from axial data set.  CONTRAST:  44mL OMNIPAQUE IOHEXOL 300 MG/ML SOLN. Dilute oral contrast.  COMPARISON:  08/06/2012  FINDINGS: Lung bases clear.  Mitral annular calcifications and scattered atherosclerotic calcifications.  Simple appearing cyst upper pole right kidney 4.3 x 3.7 cm image 26 minimally increased.  Liver, spleen, pancreas, kidneys, and adrenal glands otherwise normal appearance.  Stomach decompressed, wall thickness suboptimally assessed.  Appendix and uterus surgically absent with normal sized ovaries.  Unremarkable bladder and ureters.  Scattered sigmoid diverticulosis without evidence of diverticulitis.  Large and small bowel loops otherwise unremarkable.  No mass, adenopathy, free fluid, or inflammatory process.  Bones demineralized.  IMPRESSION: Sigmoid diverticulosis.  Minimal increase in size of a right renal cyst since 2013.  No acute intra-abdominal or intrapelvic abnormalities.   Electronically Signed   By: Lavonia Dana M.D.   On: 11/23/2013 16:27      Assessment & Plan  **C diff PCR + colitis, recurrent. Failed outpt flagyl po, improved after 2 weeks oral vanco but relapsed, started on Vanco po taper and stools once again improved.  So far no adverse effects from Rocephin  * E coli UTI, unfortunately requires Rocephin.  * Chronic Coumadin  * Hx of pancreatitis. Has current and intermittent RUQ pain. Lipase and LFTs normal. GB is out.   Principal Problem:  UTI (lower urinary tract infection) Active Problems:   DM II (diabetes mellitus, type II), controlled   HTN (hypertension)   Hypercholesteremia   Glaucoma (increased eye pressure)   Atrial fibrillation    Depression   Anxiety   C. difficile colitis   Metabolic acidosis     LOS: 2 days   Erskine Emery  11/25/2013, 12:26 PM

## 2013-11-25 NOTE — Progress Notes (Signed)
ANTICOAGULATION CONSULT NOTE - Follow Up Consult  Pharmacy Consult for Coumadin Indication: atrial fibrillation  Allergies  Allergen Reactions  . Lactose Intolerance (Gi) Nausea And Vomiting    severe stomach pain  . Penicillins Hives and Itching    "haven't used any since 1970's"  . Sulfa Antibiotics Hives and Itching    "haven't used any since 1970's"  . Ciprofloxacin Swelling    Patient Measurements: Height: 5' (152.4 cm) Weight: 146 lb 2.6 oz (66.3 kg) IBW/kg (Calculated) : 45.5  Vital Signs: Temp: 98.3 F (36.8 C) (02/21 0601) Temp src: Oral (02/21 0601) BP: 152/79 mmHg (02/21 1033) Pulse Rate: 76 (02/21 1033)  Labs:  Recent Labs  11/23/13 1306 11/23/13 1351 11/24/13 0500 11/24/13 1005 11/25/13 0516  HGB 16.6*  --  14.5  --  14.2  HCT 46.3*  --  41.7  --  41.2  PLT 254  --  224  --  204  LABPROT  --  20.1*  --  20.8* 23.0*  INR  --  1.77*  --  1.85* 2.11*  CREATININE 1.02  --  0.96  --  0.98    Estimated Creatinine Clearance: 38.2 ml/min (by C-G formula based on Cr of 0.98).   Assessment: 80 y/o female on chronic Coumadin for Afib. INR now therapeutic at 2.11. No bleeding noted, CBC stable. Cont to have diarrhea.  Goal of Therapy:  INR 2-3 Monitor platelets by anticoagulation protocol: Yes   Plan:   Coumadin 3mg  PO x1 Daily INR Will dc heparin

## 2013-11-26 LAB — GLUCOSE, CAPILLARY
GLUCOSE-CAPILLARY: 123 mg/dL — AB (ref 70–99)
Glucose-Capillary: 128 mg/dL — ABNORMAL HIGH (ref 70–99)
Glucose-Capillary: 136 mg/dL — ABNORMAL HIGH (ref 70–99)

## 2013-11-26 LAB — PROTIME-INR
INR: 1.94 — ABNORMAL HIGH (ref 0.00–1.49)
PROTHROMBIN TIME: 21.6 s — AB (ref 11.6–15.2)

## 2013-11-26 MED ORDER — WARFARIN SODIUM 4 MG PO TABS
4.0000 mg | ORAL_TABLET | Freq: Once | ORAL | Status: AC
  Administered 2013-11-26: 4 mg via ORAL
  Filled 2013-11-26: qty 1

## 2013-11-26 MED ORDER — ALBUTEROL SULFATE (2.5 MG/3ML) 0.083% IN NEBU: 3.0000 mL | INHALATION_SOLUTION | Freq: Four times a day (QID) | RESPIRATORY_TRACT | Status: DC | PRN

## 2013-11-26 NOTE — Progress Notes (Signed)
Pt is not refusing her medications this shift. RN explained to pt the importance of taking her medications. Pt is calm, cooperative and able to make needs known. Will continue to monitor.

## 2013-11-26 NOTE — Progress Notes (Signed)
ANTICOAGULATION CONSULT NOTE - Follow Up Consult  Pharmacy Consult for Coumadin Indication: atrial fibrillation  Allergies  Allergen Reactions  . Lactose Intolerance (Gi) Nausea And Vomiting    severe stomach pain  . Penicillins Hives and Itching    "haven't used any since 1970's"  . Sulfa Antibiotics Hives and Itching    "haven't used any since 1970's"  . Ciprofloxacin Swelling    Patient Measurements: Height: 5' (152.4 cm) Weight: 146 lb 2.6 oz (66.3 kg) IBW/kg (Calculated) : 45.5  Vital Signs: Temp: 97.8 F (36.6 C) (02/22 0611) Temp src: Oral (02/22 0611) BP: 145/76 mmHg (02/22 1035) Pulse Rate: 106 (02/22 1034)  Labs:  Recent Labs  11/24/13 0500 11/24/13 1005 11/25/13 0516 11/26/13 0501  HGB 14.5  --  14.2  --   HCT 41.7  --  41.2  --   PLT 224  --  204  --   LABPROT  --  20.8* 23.0* 21.6*  INR  --  1.85* 2.11* 1.94*  CREATININE 0.96  --  0.98  --     Estimated Creatinine Clearance: 38.2 ml/min (by C-G formula based on Cr of 0.98).   Assessment: 80 y/o female on chronic Coumadin for Afib. INR dropped slightly so will give a slightly bigger dose today. No bleeding noted, CBC stable. Diarrhea is improving.   Goal of Therapy:  INR 2-3 Monitor platelets by anticoagulation protocol: Yes   Plan:   Coumadin 4mg  PO x1 Daily INR

## 2013-11-26 NOTE — Progress Notes (Signed)
Subjective: Her spirits are up today after I told her that she would likely be discharged to home tomorrow. She continues to have some loose stools, no dysuria or dyspnea.  Objective: Vital signs in last 24 hours: Temp:  [97.8 F (36.6 C)-98.7 F (37.1 C)] 97.8 F (36.6 C) (02/22 0611) Pulse Rate:  [72-106] 106 (02/22 1034) Resp:  [17-18] 17 (02/22 0611) BP: (138-145)/(73-90) 145/76 mmHg (02/22 1035) SpO2:  [97 %-98 %] 98 % (02/22 0611) Weight change:    Intake/Output from previous day: 02/21 0701 - 02/22 0700 In: 1000 [I.V.:900; IV Piggyback:100] Out: -    General appearance: alert, cooperative and no distress Resp: clear to auscultation bilaterally Cardio: regular rate and rhythm GI: soft, non-tender; bowel sounds normal; no masses,  no organomegaly Extremities: extremities normal, atraumatic, no cyanosis or edema  Lab Results:  Recent Labs  11/24/13 0500 11/25/13 0516  WBC 12.4* 10.1  HGB 14.5 14.2  HCT 41.7 41.2  PLT 224 204   BMET  Recent Labs  11/24/13 0500 11/25/13 0516  NA 140 142  K 3.7 3.9  CL 107 108  CO2 16* 21  GLUCOSE 79 115*  BUN 16 12  CREATININE 0.96 0.98  CALCIUM 8.6 8.7   CMET CMP     Component Value Date/Time   NA 142 11/25/2013 0516   K 3.9 11/25/2013 0516   CL 108 11/25/2013 0516   CO2 21 11/25/2013 0516   GLUCOSE 115* 11/25/2013 0516   BUN 12 11/25/2013 0516   CREATININE 0.98 11/25/2013 0516   CALCIUM 8.7 11/25/2013 0516   PROT 8.2 11/23/2013 1306   ALBUMIN 4.1 11/23/2013 1306   AST 37 11/23/2013 1306   ALT 26 11/23/2013 1306   ALKPHOS 81 11/23/2013 1306   BILITOT 0.7 11/23/2013 1306   GFRNONAA 53* 11/25/2013 0516   GFRAA 61* 11/25/2013 0516    CBG (last 3)   Recent Labs  11/25/13 1720 11/25/13 2121 11/26/13 0818  GLUCAP 123* 112* 128*    INR RESULTS:   Lab Results  Component Value Date   INR 1.94* 11/26/2013   INR 2.11* 11/25/2013   INR 1.85* 11/24/2013     Studies/Results: No results found.  Medications: I have  reviewed the patient's current medications.  Assessment/Plan: #1 Colitis: stable and we will do a tapering course of vancomycin upon discharge. She has some liquid vancomycin at home already. #2 Bacteriuria: she is doing well with rocephin, and appears to have had simple cystitis as she did not have flank pain, nausea, or significant dysuria at the time of admission, so she will complete rocephin treatment today.   LOS: 3 days   Tavion Senkbeil G 11/26/2013, 11:48 AM

## 2013-11-26 NOTE — Progress Notes (Signed)
     Progress Note   Subjective  Diarrhea is improving.  Denies abdominal pain   Objective  Vital signs in last 24 hours: Temp:  [97.8 F (36.6 C)-98.7 F (37.1 C)] 97.8 F (36.6 C) (02/22 0611) Pulse Rate:  [72-106] 106 (02/22 1034) Resp:  [17-18] 17 (02/22 0611) BP: (138-145)/(73-90) 145/76 mmHg (02/22 1035) SpO2:  [97 %-98 %] 98 % (02/22 0611) Last BM Date: 11/25/13 General:   Alert,  Well-developed,  white female in NAD Heart:  Regular rate and rhythm; no murmurs Abdomen:  Soft, nontender and nondistended. Normal bowel sounds, without guarding, and without rebound.   Extremities:  Without edema. Neurologic:  Alert and  oriented x4;  grossly normal neurologically. Psych:  Alert and cooperative. Normal mood and affect.  Intake/Output from previous day: 02/21 0701 - 02/22 0700 In: 1000 [I.V.:900; IV Piggyback:100] Out: -  Intake/Output this shift:    Lab Results:  Recent Labs  11/23/13 1306 11/24/13 0500 11/25/13 0516  WBC 11.6* 12.4* 10.1  HGB 16.6* 14.5 14.2  HCT 46.3* 41.7 41.2  PLT 254 224 204   BMET  Recent Labs  11/23/13 1306 11/24/13 0500 11/25/13 0516  NA 141 140 142  K 4.2 3.7 3.9  CL 104 107 108  CO2 15* 16* 21  GLUCOSE 143* 79 115*  BUN 22 16 12   CREATININE 1.02 0.96 0.98  CALCIUM 9.7 8.6 8.7   LFT  Recent Labs  11/23/13 1306  PROT 8.2  ALBUMIN 4.1  AST 37  ALT 26  ALKPHOS 81  BILITOT 0.7   PT/INR  Recent Labs  11/25/13 0516 11/26/13 0501  LABPROT 23.0* 21.6*  INR 2.11* 1.94*   Hepatitis Panel No results found for this basename: HEPBSAG, HCVAB, HEPAIGM, HEPBIGM,  in the last 72 hours  Studies/Results: No results found.    Assessment & Plan  **C diff PCR + colitis, recurrent. Failed outpt flagyl po, improved after 2 weeks oral vanco but relapsed, started on Vanco po taper and stools once again improved.  So far no adverse effects from Rocephin.  Continue outpt regimen of vancomycin taper.  * E coli UTI,  unfortunately requires Rocephin.  * Chronic Coumadin  * Hx of pancreatitis. Has current and intermittent RUQ pain. Lipase and LFTs normal. GB is out.   Principal Problem:   UTI (lower urinary tract infection) Active Problems:   DM II (diabetes mellitus, type II), controlled   HTN (hypertension)   Hypercholesteremia   Glaucoma (increased eye pressure)   Atrial fibrillation    Depression   Anxiety   C. difficile colitis   Metabolic acidosis     LOS: 3 days   [name] [name]  11/26/2013, 1:04 PM

## 2013-11-27 DIAGNOSIS — A419 Sepsis, unspecified organism: Secondary | ICD-10-CM

## 2013-11-27 DIAGNOSIS — N3090 Cystitis, unspecified without hematuria: Secondary | ICD-10-CM | POA: Diagnosis present

## 2013-11-27 DIAGNOSIS — R6520 Severe sepsis without septic shock: Secondary | ICD-10-CM | POA: Diagnosis present

## 2013-11-27 HISTORY — DX: Sepsis, unspecified organism: A41.9

## 2013-11-27 LAB — PROTIME-INR
INR: 2.5 — ABNORMAL HIGH (ref 0.00–1.49)
Prothrombin Time: 26.2 seconds — ABNORMAL HIGH (ref 11.6–15.2)

## 2013-11-27 LAB — GLUCOSE, CAPILLARY
GLUCOSE-CAPILLARY: 113 mg/dL — AB (ref 70–99)
GLUCOSE-CAPILLARY: 126 mg/dL — AB (ref 70–99)

## 2013-11-27 NOTE — ED Provider Notes (Signed)
This patient was seen in conjunction with the resident physician.  The documentation accurately reflects the patient's clinical course in the emergency department.  On my exam the patient continued to complain of diarrhea, abdominal pain.  Patient was admitted for further evaluation and management.  Carmin Muskrat, MD 11/27/13 [phone]

## 2013-11-27 NOTE — Discharge Instructions (Signed)
Information on my medicine - Coumadin®   (Warfarin) ° °This medication education was reviewed with me or my healthcare representative as part of my discharge preparation.  The pharmacist that spoke with me during my hospital stay was:  Lylla Eifler Stillinger, RPH ° °Why was Coumadin prescribed for you? °Coumadin was prescribed for you because you have a blood clot or a medical condition that can cause an increased risk of forming blood clots. Blood clots can cause serious health problems by blocking the flow of blood to the heart, lung, or brain. Coumadin can prevent harmful blood clots from forming. °As a reminder your indication for Coumadin is:   Stroke Prevention Because Of Atrial Fibrillation ° °What test will check on my response to Coumadin? °While on Coumadin (warfarin) you will need to have an INR test regularly to ensure that your dose is keeping you in the desired range. The INR (international normalized ratio) number is calculated from the result of the laboratory test called prothrombin time (PT). ° °If an INR APPOINTMENT HAS NOT ALREADY BEEN MADE FOR YOU please schedule an appointment to have this lab work done by your health care provider within 7 days. °Your INR goal is usually a number between:  2 to 3 or your provider may give you a more narrow range like 2-2.5.  Ask your health care provider during an office visit what your goal INR is. ° °What  do you need to  know  About  COUMADIN? °Take Coumadin (warfarin) exactly as prescribed by your healthcare provider about the same time each day.  DO NOT stop taking without talking to the doctor who prescribed the medication.  Stopping without other blood clot prevention medication to take the place of Coumadin may increase your risk of developing a new clot or stroke.  Get refills before you run out. ° °What do you do if you miss a dose? °If you miss a dose, take it as soon as you remember on the same day then continue your regularly scheduled  regimen the next day.  Do not take two doses of Coumadin at the same time. ° °Important Safety Information °A possible side effect of Coumadin (Warfarin) is an increased risk of bleeding. You should call your healthcare provider right away if you experience any of the following: °  Bleeding from an injury or your nose that does not stop. °  Unusual colored urine (red or dark brown) or unusual colored stools (red or black). °  Unusual bruising for unknown reasons. °  A serious fall or if you hit your head (even if there is no bleeding). ° °Some foods or medicines interact with Coumadin® (warfarin) and might alter your response to warfarin. To help avoid this: °  Eat a balanced diet, maintaining a consistent amount of Vitamin K. °  Notify your provider about major diet changes you plan to make. °  Avoid alcohol or limit your intake to 1 drink for women and 2 drinks for men per day. °(1 drink is 5 oz. wine, 12 oz. beer, or 1.5 oz. liquor.) ° °Make sure that ANY health care provider who prescribes medication for you knows that you are taking Coumadin (warfarin).  Also make sure the healthcare provider who is monitoring your Coumadin knows when you have started a new medication including herbals and non-prescription products. ° °Coumadin® (Warfarin)  Major Drug Interactions  °Increased Warfarin Effect Decreased Warfarin Effect  °Alcohol (large quantities) °Antibiotics (esp. Septra/Bactrim, Flagyl, Cipro) °Amiodarone (Cordarone) °Aspirin (  ASA) °Cimetidine (Tagamet) °Megestrol (Megace) °NSAIDs (ibuprofen, naproxen, etc.) °Piroxicam (Feldene) °Propafenone (Rythmol SR) °Propranolol (Inderal) °Isoniazid (INH) °Posaconazole (Noxafil) Barbiturates (Phenobarbital) °Carbamazepine (Tegretol) °Chlordiazepoxide (Librium) °Cholestyramine (Questran) °Griseofulvin °Oral Contraceptives °Rifampin °Sucralfate (Carafate) °Vitamin K  ° °Coumadin® (Warfarin) Major Herbal Interactions  °Increased Warfarin Effect Decreased Warfarin Effect    °Garlic °Ginseng °Ginkgo biloba Coenzyme Q10 °Green tea °St. John’s wort   ° °Coumadin® (Warfarin) FOOD Interactions  °Eat a consistent number of servings per week of foods HIGH in Vitamin K °(1 serving = ½ cup)  °Collards (cooked, or boiled & drained) °Kale (cooked, or boiled & drained) °Mustard greens (cooked, or boiled & drained) °Parsley *serving size only = ¼ cup °Spinach (cooked, or boiled & drained) °Swiss chard (cooked, or boiled & drained) °Turnip greens (cooked, or boiled & drained)  °Eat a consistent number of servings per week of foods MEDIUM-HIGH in Vitamin K °(1 serving = 1 cup)  °Asparagus (cooked, or boiled & drained) °Broccoli (cooked, boiled & drained, or raw & chopped) °Brussel sprouts (cooked, or boiled & drained) *serving size only = ½ cup °Lettuce, raw (green leaf, endive, romaine) °Spinach, raw °Turnip greens, raw & chopped  ° °These websites have more information on Coumadin (warfarin):  www.coumadin.com; °www.ahrq.gov/consumer/coumadin.htm; ° ° ° °

## 2013-11-27 NOTE — Progress Notes (Signed)
ANTICOAGULATION CONSULT NOTE - Follow Up Consult  Pharmacy Consult for Coumadin Indication: atrial fibrillation  Allergies  Allergen Reactions  . Lactose Intolerance (Gi) Nausea And Vomiting    severe stomach pain  . Penicillins Hives and Itching    "haven't used any since 1970's"  . Sulfa Antibiotics Hives and Itching    "haven't used any since 1970's"  . Ciprofloxacin Swelling    Patient Measurements: Height: 5' (152.4 cm) Weight: 146 lb 2.6 oz (66.3 kg) IBW/kg (Calculated) : 45.5 Heparin Dosing Weight:    Vital Signs: Temp: 97.6 F (36.4 C) (02/23 0438) Temp src: Oral (02/23 0438) BP: 151/82 mmHg (02/23 1005) Pulse Rate: 83 (02/23 1005)  Labs:  Recent Labs  11/25/13 0516 11/26/13 0501 11/27/13 0712  HGB 14.2  --   --   HCT 41.2  --   --   PLT 204  --   --   LABPROT 23.0* 21.6* 26.2*  INR 2.11* 1.94* 2.50*  CREATININE 0.98  --   --     Estimated Creatinine Clearance: 38.2 ml/min (by C-G formula based on Cr of 0.98).  Assessment: CC: abd discomfort, hx C diff (treated in Dec for 14 days with PO vanc, recurrence in Feb 11th and started on vanc taper)  AC: Coumadin for Afib, INR 2.5 PTA 2.5 mg/d  ID: Recurrent C diff on D12 vanc (was on PO taper PTA)/florastor. Afebrile. Rocephin dced  2/19 UCx - >100K E coli  C diff positive in Dec and Feb  CV: HTN, HLD, Afib - diltiazem, lopressor  Endo: DM, CBGs ok on SSI, lantus  GI/Nutrition: metabolic acidosis, MV, Florastor  Neuro: depression, anxiety - Zoloft  Renal: Scr and lytes wnl  Pulm: alb nebs  Heme/Onc: CBC ok  PTA Med Issues: amaryl, ritalin, prilosec (contributer to C diff?)  Best Practices: Coumadin  Goal of Therapy:  INR 2-3 Monitor platelets by anticoagulation protocol: Yes   Plan:  Plan discharge home on 2.5mg  Coumadin daily as PTA   Cadence Haslam S. Alford Highland, PharmD, BCPS Clinical Staff Pharmacist Pager [phone] Eilene Ghazi Stillinger 11/27/2013,11:04 AM

## 2013-11-27 NOTE — Progress Notes (Signed)
NURSING PROGRESS NOTE  [name] [name] [id number] Discharge Data: 11/27/2013 10:54 AM Attending Provider: [name], MD ZTI:[id number] Darnell Level, MD     [name] to be D/C'd Home per MD order.  Discussed with the patient the After Visit Summary and all questions fully answered. All IV's discontinued with no bleeding noted. All belongings returned to patient for patient to take home.   Last Vital Signs:  Blood pressure 151/82, pulse 83, temperature 97.6 F (36.4 C), temperature source Oral, resp. rate 17, height 5' (1.524 m), weight 66.3 kg (146 lb 2.6 oz), SpO2 95.00%.  Discharge Medication List   Medication List         albuterol 0.63 MG/3ML nebulizer solution  Commonly known as:  ACCUNEB  Take 1 ampule by nebulization 2 (two) times daily.     ALPRAZolam 1 MG tablet  Commonly known as:  XANAX  Take 0.5 tablets (0.5 mg total) by mouth 4 (four) times daily as needed for anxiety. For sleep or anxiety.     diltiazem 120 MG 24 hr capsule  Commonly known as:  CARDIZEM CD  Take 1 capsule (120 mg total) by mouth daily.     glimepiride 4 MG tablet  Commonly known as:  AMARYL  Take 4 mg by mouth daily with breakfast.     HYDROcodone-acetaminophen 5-500 MG per tablet  Commonly known as:  VICODIN  Take 1 tablet by mouth every 6 (six) hours as needed for pain.     LANTUS SOLOSTAR 100 UNIT/ML Solostar Pen  Generic drug:  Insulin Glargine  Inject 28 Units into the skin daily.     latanoprost 0.005 % ophthalmic solution  Commonly known as:  XALATAN  Place 1 drop into both eyes at bedtime.     methylphenidate 5 MG tablet  Commonly known as:  RITALIN  Take 5 mg by mouth 2 (two) times daily.     metoprolol tartrate 25 MG tablet  Commonly known as:  LOPRESSOR  TAKE 1 TABLET BY MOUTH TWICE DAILY     multivitamin with minerals Tabs tablet  Take 1 tablet by mouth daily.     omeprazole 20 MG capsule  Commonly known as:  PRILOSEC  Take 20 mg by mouth 2 (two) times  daily.     saccharomyces boulardii 250 MG capsule  Commonly known as:  FLORASTOR  Take one po BID x 6 weeks     sertraline 50 MG tablet  Commonly known as:  ZOLOFT  Take 50 mg by mouth every evening.     vancomycin 250 MG capsule  Commonly known as:  VANCOCIN  Take 250 mg QID x 2 weeks then TID x 1 week then BID x 1 week then daily x 1 week. May compound liquid     warfarin 2.5 MG tablet  Commonly known as:  COUMADIN  Take 2.5 mg by mouth daily.     zolpidem 5 MG tablet  Commonly known as:  AMBIEN  Take 5 mg by mouth at bedtime as needed for sleep.

## 2013-11-27 NOTE — Discharge Summary (Signed)
Physician Discharge Summary  Patient ID: [name] [name] MRN: [id number] DOB/AGE: 1929/11/18 80 y.o.  Admit date: 11/23/2013 Discharge date: 11/27/2013   Discharge Diagnoses:  Principal Problem:   Sepsis Active Problems:   Enteritis due to Clostridium difficile   Cystitis   DM II (diabetes mellitus, type II), controlled   Atrial fibrillation    HTN (hypertension)   Hypercholesteremia   Glaucoma (increased eye pressure)   Depression   Anxiety   C. difficile colitis   Metabolic acidosis   UTI (lower urinary tract infection)   Discharged Condition: good  Hospital Course:  [name] [name] is a 80 y.o. female presenting with abdominal pain. Patient has history of C. difficile of which she has had 8 days of oral vancomycin therapy. Has had poor oral intake and abdominal discomfort which has been progressively getting worse within the last week. Given persistent and worsening abdominal discomfort patient decided to come to the ED for evaluation. She is also endorsing dysuria. Nothing she is aware of makes it better or worse.  Testing in the ER showed lactic acidosis with leukocytosis and bacteriuria, so she was started on IVF, Rocephin, and continued vancomycin po. She did well with this, and urine culture showed E. Coli that was sensitive to rocephin. She was reluctant to continue vancomycin and I advised her and her husband that this was critical to her survival. On the day of discharge she felt tired and continued to have mild diarrhea, no fever or chills or dyspnea. She had no complications from her hospitalization.  Consults: GI  Significant Diagnostic Studies:  No results found.  Labs: Lab Results  Component Value Date   WBC 10.1 11/25/2013   HGB 14.2 11/25/2013   HCT 41.2 11/25/2013   MCV 89.6 11/25/2013   PLT 204 11/25/2013     Recent Labs Lab 11/23/13 1306  11/25/13 0516  NA 141  < > 142  K 4.2  < > 3.9  CL 104  < > 108  CO2 15*  < > 21  BUN 22  < > 12   CREATININE 1.02  < > 0.98  CALCIUM 9.7  < > 8.7  PROT 8.2  --   --   BILITOT 0.7  --   --   ALKPHOS 81  --   --   ALT 26  --   --   AST 37  --   --   GLUCOSE 143*  < > 115*  < > = values in this interval not displayed.     Lab Results  Component Value Date   INR 2.50* 11/27/2013   INR 1.94* 11/26/2013   INR 2.11* 11/25/2013     Recent Results (from the past 240 hour(s))  URINE CULTURE     Status: None   Collection Time    11/23/13  2:31 PM      Result Value Ref Range Status   Specimen Description URINE, RANDOM   Final   Special Requests ADDED AT 7846 ON [id number]   Final   Culture  Setup Time     Final   Value: 11/23/2013 18:58     Performed at [place]     Final   Value: >=100,000 COLONIES/ML     Performed at [facility]   Culture     Final   Value: ESCHERICHIA COLI     Performed at [facility]   Report Status 11/25/2013 FINAL   Final   Organism  ID, Bacteria ESCHERICHIA COLI   Final      Discharge Exam: Blood pressure 130/72, pulse 66, temperature 97.6 F (36.4 C), temperature source Oral, resp. rate 17, height 5' (1.524 m), weight 66.3 kg (146 lb 2.6 oz), SpO2 95.00%.  Physical Exam: In general, she is an elderly white woman who was in no apparent distress. HEENT was normal, chest was clear to auscultation, heart had a slightly irregular rhythm, abdomen had normal bowel sounds and no tenderness, extremities had bilateral 1+ leg edema, she was alert and well oriented and could move all extremities well.  Disposition:She will be discharged to home today in the company of her husband.  Discharge Orders   Future Appointments Provider Department Dept Phone   01/02/2014 1:30 PM Gatha Mayer, MD [facility] [phone]   Future Orders Complete By Expires   Call MD for:  As directed    Comments:     Call for persistent fever, severe diarrhea, unable to keep food down, or other concerning symptoms    Diet - low sodium heart healthy  As directed    Discharge instructions  As directed    Comments:     Take the full tapering course of vancomycin liquid as prescribed by Dr. Deatra Ina, and can mix this with whatever desired to make it more palatable.   Increase activity slowly  As directed        Medication List         albuterol 0.63 MG/3ML nebulizer solution  Commonly known as:  ACCUNEB  Take 1 ampule by nebulization 2 (two) times daily.     ALPRAZolam 1 MG tablet  Commonly known as:  XANAX  Take 0.5 tablets (0.5 mg total) by mouth 4 (four) times daily as needed for anxiety. For sleep or anxiety.     diltiazem 120 MG 24 hr capsule  Commonly known as:  CARDIZEM CD  Take 1 capsule (120 mg total) by mouth daily.     glimepiride 4 MG tablet  Commonly known as:  AMARYL  Take 4 mg by mouth daily with breakfast.     HYDROcodone-acetaminophen 5-500 MG per tablet  Commonly known as:  VICODIN  Take 1 tablet by mouth every 6 (six) hours as needed for pain.     LANTUS SOLOSTAR 100 UNIT/ML Solostar Pen  Generic drug:  Insulin Glargine  Inject 28 Units into the skin daily.     latanoprost 0.005 % ophthalmic solution  Commonly known as:  XALATAN  Place 1 drop into both eyes at bedtime.     methylphenidate 5 MG tablet  Commonly known as:  RITALIN  Take 5 mg by mouth 2 (two) times daily.     metoprolol tartrate 25 MG tablet  Commonly known as:  LOPRESSOR  TAKE 1 TABLET BY MOUTH TWICE DAILY     multivitamin with minerals Tabs tablet  Take 1 tablet by mouth daily.     omeprazole 20 MG capsule  Commonly known as:  PRILOSEC  Take 20 mg by mouth 2 (two) times daily.     saccharomyces boulardii 250 MG capsule  Commonly known as:  FLORASTOR  Take one po BID x 6 weeks     sertraline 50 MG tablet  Commonly known as:  ZOLOFT  Take 50 mg by mouth every evening.     vancomycin 250 MG capsule  Commonly known as:  VANCOCIN  Take 250 mg QID x 2 weeks then TID x 1 week then BID x 1  week  then daily x 1 week. May compound liquid     warfarin 2.5 MG tablet  Commonly known as:  COUMADIN  Take 2.5 mg by mouth daily.     zolpidem 5 MG tablet  Commonly known as:  AMBIEN  Take 5 mg by mouth at bedtime as needed for sleep.           Follow-up Information   Follow up with Donnajean Lopes, MD. Schedule an appointment as soon as possible for a visit in 1 week.   Specialty:  Internal Medicine   Contact information:   [address] [place] [address] [phone]       Follow up with Erskine Emery, MD. Schedule an appointment as soon as possible for a visit in 2 weeks.   Specialty:  Gastroenterology   Contact information:   [address] [place] [address] [phone]       Signed: Donnajean Lopes 11/27/2013, 8:17 AM

## 2013-11-27 NOTE — Care Management Note (Signed)
    Page 1 of 1   11/27/2013     5:54:03 PM   CARE MANAGEMENT NOTE 11/27/2013  Patient:  [name] [name], [name] [name]   Account Number:  [id number]  Date Initiated:  11/27/2013  Documentation initiated by:  Tomi Bamberger  Subjective/Objective Assessment:   dx met acidosis  admit- lives with spouse.     Action/Plan:   Anticipated DC Date:  11/27/2013   Anticipated DC Plan:  [place]  CM consult      Choice offered to / List presented to:             Status of service:  Completed, signed off Medicare Important Message given?   (If response is "NO", the following Medicare IM given date fields will be blank) Date Medicare IM given:   Date Additional Medicare IM given:    Discharge Disposition:  HOME/SELF CARE  Per UR Regulation:  Reviewed for med. necessity/level of care/duration of stay  If discussed at [place] of Stay Meetings, dates discussed:    Comments:

## 2013-12-11 ENCOUNTER — Other Ambulatory Visit: Payer: Self-pay | Admitting: Internal Medicine

## 2013-12-11 DIAGNOSIS — R1310 Dysphagia, unspecified: Secondary | ICD-10-CM

## 2013-12-13 ENCOUNTER — Other Ambulatory Visit: Payer: Medicare Other

## 2013-12-19 ENCOUNTER — Telehealth: Payer: Self-pay | Admitting: Internal Medicine

## 2013-12-19 ENCOUNTER — Ambulatory Visit
Admission: RE | Admit: 2013-12-19 | Discharge: 2013-12-19 | Disposition: A | Discharge status: Home / Self Care | Payer: Medicare Other | Source: Ambulatory Visit | Attending: Internal Medicine | Admitting: Internal Medicine

## 2013-12-19 DIAGNOSIS — R1310 Dysphagia, unspecified: Secondary | ICD-10-CM

## 2013-12-19 NOTE — Telephone Encounter (Signed)
Left message for patient to call me back. 

## 2013-12-19 NOTE — Telephone Encounter (Signed)
This is ok as long as not having diarrhea - was sent home on a vancomycin taper

## 2013-12-19 NOTE — Telephone Encounter (Signed)
Spoke to husband as [name] [name] was lying down.  He said she is having no diarrhea.  Per Dr. Carlean Purl continue the [facility] daily.  Follow up appointment  Is March 31st at 1:30pm.  He was happy to hear that she does not need the Vancomycin refilled.

## 2013-12-28 ENCOUNTER — Telehealth: Payer: Self-pay | Admitting: Internal Medicine

## 2013-12-28 DIAGNOSIS — R197 Diarrhea, unspecified: Secondary | ICD-10-CM

## 2013-12-28 NOTE — Telephone Encounter (Signed)
Spoke with patient's husband and patient has been off Vancomycin for 5 days. On Tuesday and Wednesday she had loose stools. Reports she took Imodium on Tuesday. This AM, she had one watery diarrhea stool. She has not had any diarrhea since the one episode this AM. Wants to let Dr. Carlean Purl know about this. Please, advise.

## 2013-12-28 NOTE — Telephone Encounter (Signed)
Spoke with patient and gave her Dr. Celesta Aver recommendation. Lab in [facility] and she will come for kit if she has another loose stool.

## 2013-12-28 NOTE — Telephone Encounter (Signed)
Sometimes can have some loose stools but not C diff  Please place an order for C diff by PCR and if she has more loose stools she should do the test. Could pick up the collection device and have at home in case.

## 2014-01-01 ENCOUNTER — Other Ambulatory Visit: Payer: Medicare Other

## 2014-01-01 DIAGNOSIS — R197 Diarrhea, unspecified: Secondary | ICD-10-CM

## 2014-01-02 ENCOUNTER — Ambulatory Visit (INDEPENDENT_AMBULATORY_CARE_PROVIDER_SITE_OTHER): Payer: Medicare Other | Admitting: Internal Medicine

## 2014-01-02 ENCOUNTER — Encounter: Payer: Self-pay | Admitting: Internal Medicine

## 2014-01-02 VITALS — BP 120/64 | HR 84 | Ht 60.0 in | Wt 143.4 lb

## 2014-01-02 DIAGNOSIS — A0472 Enterocolitis due to Clostridium difficile, not specified as recurrent: Secondary | ICD-10-CM

## 2014-01-02 LAB — CLOSTRIDIUM DIFFICILE BY PCR: CDIFFPCR: DETECTED — AB

## 2014-01-02 MED ORDER — VANCOMYCIN 50 MG/ML ORAL SOLUTION: 125.0000 mg | Freq: Four times a day (QID) | ORAL | Status: DC

## 2014-01-02 NOTE — Patient Instructions (Addendum)
We have sent the following medications to your pharmacy for you to pick up at your convenience: Vancomycin oral  Take 125 mg (2.5 cc) 4 times a day for 2 weeks Then take it 125 mg 3 times a day for 2 weeks Then 125 mg  2 times a day for 2 weeks Then 125 mg one time a day for 2 weeks Then 125mg   every other day for 2 weeks  Then 125 mg every third day for 2 weeks Then stop  Please try not to take any other antibiotics unless absolutely necessary and let me know if that is the case   Today we are providing you with a handout on C. Diff to read.  I appreciate the opportunity to care for you. I will let Dr. Philip Aspen know what is going on.  Office follow-up is not needed but call to let me know what is going on and if you have other problems, more diarrhea Gatha Mayer, MD, [facility]

## 2014-01-02 NOTE — Progress Notes (Signed)
         Subjective:    Patient ID: [name] [name], female    DOB: 1930-08-25, 80 y.o.   MRN: [id number]  HPI Patient is here with her husband. She has had a long protracted course of diarrhea problems it eventually were found to be C. differential. I thought initially when I saw her late last year she might have postcholecystectomy diarrhea or some other sort of problem and she had chronic diarrhea but what we have been dealing with lately is Clostridium difficile infection that has been problematic and we've been unable to eradicate it. She was hospitalized in February with urosepsis, required antibiotics, she was in the middle of C. difficile treatment then, she completed another course of vancomycin and felt better but then has developed diarrhea as of last week. She had a stool for C. differential PCR taken last week as she had more loose stools and this is positive again. She denies abdominal pain but is having about 3 loose stools a day, that's up from one or 2 formed stools a day, and this is consistent with recurrent flare of C. difficile colitis severe some of the other ones. Her husband is here today participate some in the history. She has not had fever. She is maintaining hydration. Medications, allergies, past medical history, past surgical history, family history and social history are reviewed and updated in the EMR.  Review of Systems As above    Objective:   Physical Exam Elderly white woman in no acute distress Abdomen is soft and nontender       Assessment & Plan:  C. difficile colitis Sounds like she has another recurrence - this is the third C. differential PCR positive, it was positive in December and February and now on March 30. She could still just be positive and has some postinfectious IBS but I doubt it. Will put her back on a tapering dose of vancomycin using 125 mg 4 times a day for 2 weeks and then reducing over 2 week intervals down to every third day. This  will be a very prolonged tapering hopefully will eradicate the C. difficile. If this fails to eradicate things would need to consider fecal microbiotica transplant   I have not schedule specific followup at this point, her husband is very in 2 things, and if she has more diarrhea after thiscall back. She will keep followup with primary care, Dr. Janie Morning.  XB:[id number] G, MD

## 2014-01-02 NOTE — Assessment & Plan Note (Addendum)
Sounds like she has another recurrence - this is the third C. differential PCR positive, it was positive in December and February and now on March 30. She could still just be positive and has some postinfectious IBS but I doubt it. Will put her back on a tapering dose of vancomycin using 125 mg 4 times a day for 2 weeks and then reducing over 2 week intervals down to every third day. This will be a very prolonged tapering hopefully will eradicate the C. difficile. If this fails to eradicate things would need to consider fecal microbiotica transplant

## 2014-01-12 ENCOUNTER — Telehealth: Payer: Self-pay | Admitting: Internal Medicine

## 2014-01-12 NOTE — Telephone Encounter (Signed)
Patient is having loose stools still, but is on vanc and florastor BID. He wanted Dr. Carlean Purl to be aware.  They will continue on the vanc and call with an update when she has completed the course.

## 2014-01-16 DIAGNOSIS — F339 Major depressive disorder, recurrent, unspecified: Secondary | ICD-10-CM | POA: Insufficient documentation

## 2014-03-10 ENCOUNTER — Encounter (HOSPITAL_COMMUNITY): Payer: Self-pay | Admitting: Emergency Medicine

## 2014-03-10 ENCOUNTER — Emergency Department (HOSPITAL_COMMUNITY): Payer: Medicare Other

## 2014-03-10 ENCOUNTER — Inpatient Hospital Stay (HOSPITAL_COMMUNITY)
Admission: EM | Admit: 2014-03-10 | Discharge: 2014-03-12 | DRG: 309 | Disposition: A | Discharge status: Home / Self Care | Payer: Medicare Other | Attending: Internal Medicine | Admitting: Internal Medicine

## 2014-03-10 DIAGNOSIS — Z961 Presence of intraocular lens: Secondary | ICD-10-CM

## 2014-03-10 DIAGNOSIS — J441 Chronic obstructive pulmonary disease with (acute) exacerbation: Secondary | ICD-10-CM | POA: Diagnosis present

## 2014-03-10 DIAGNOSIS — M545 Low back pain, unspecified: Secondary | ICD-10-CM | POA: Diagnosis present

## 2014-03-10 DIAGNOSIS — K589 Irritable bowel syndrome without diarrhea: Secondary | ICD-10-CM

## 2014-03-10 DIAGNOSIS — R1013 Epigastric pain: Secondary | ICD-10-CM

## 2014-03-10 DIAGNOSIS — F3289 Other specified depressive episodes: Secondary | ICD-10-CM | POA: Diagnosis present

## 2014-03-10 DIAGNOSIS — F419 Anxiety disorder, unspecified: Secondary | ICD-10-CM

## 2014-03-10 DIAGNOSIS — K529 Noninfective gastroenteritis and colitis, unspecified: Secondary | ICD-10-CM | POA: Diagnosis present

## 2014-03-10 DIAGNOSIS — Z79899 Other long term (current) drug therapy: Secondary | ICD-10-CM

## 2014-03-10 DIAGNOSIS — R0602 Shortness of breath: Secondary | ICD-10-CM | POA: Diagnosis present

## 2014-03-10 DIAGNOSIS — F411 Generalized anxiety disorder: Secondary | ICD-10-CM | POA: Diagnosis present

## 2014-03-10 DIAGNOSIS — Z9849 Cataract extraction status, unspecified eye: Secondary | ICD-10-CM

## 2014-03-10 DIAGNOSIS — Z7901 Long term (current) use of anticoagulants: Secondary | ICD-10-CM

## 2014-03-10 DIAGNOSIS — E785 Hyperlipidemia, unspecified: Secondary | ICD-10-CM | POA: Diagnosis present

## 2014-03-10 DIAGNOSIS — Z794 Long term (current) use of insulin: Secondary | ICD-10-CM

## 2014-03-10 DIAGNOSIS — E119 Type 2 diabetes mellitus without complications: Secondary | ICD-10-CM | POA: Diagnosis present

## 2014-03-10 DIAGNOSIS — A0472 Enterocolitis due to Clostridium difficile, not specified as recurrent: Secondary | ICD-10-CM

## 2014-03-10 DIAGNOSIS — R0789 Other chest pain: Secondary | ICD-10-CM

## 2014-03-10 DIAGNOSIS — D72829 Elevated white blood cell count, unspecified: Secondary | ICD-10-CM | POA: Diagnosis present

## 2014-03-10 DIAGNOSIS — F329 Major depressive disorder, single episode, unspecified: Secondary | ICD-10-CM | POA: Diagnosis present

## 2014-03-10 DIAGNOSIS — I10 Essential (primary) hypertension: Secondary | ICD-10-CM | POA: Diagnosis present

## 2014-03-10 DIAGNOSIS — K219 Gastro-esophageal reflux disease without esophagitis: Secondary | ICD-10-CM | POA: Diagnosis present

## 2014-03-10 DIAGNOSIS — E7800 Pure hypercholesterolemia, unspecified: Secondary | ICD-10-CM | POA: Diagnosis present

## 2014-03-10 DIAGNOSIS — F32A Depression, unspecified: Secondary | ICD-10-CM

## 2014-03-10 DIAGNOSIS — I4891 Unspecified atrial fibrillation: Principal | ICD-10-CM | POA: Diagnosis present

## 2014-03-10 DIAGNOSIS — I340 Nonrheumatic mitral (valve) insufficiency: Secondary | ICD-10-CM

## 2014-03-10 DIAGNOSIS — I059 Rheumatic mitral valve disease, unspecified: Secondary | ICD-10-CM | POA: Diagnosis present

## 2014-03-10 DIAGNOSIS — H409 Unspecified glaucoma: Secondary | ICD-10-CM | POA: Diagnosis present

## 2014-03-10 DIAGNOSIS — G8929 Other chronic pain: Secondary | ICD-10-CM | POA: Diagnosis present

## 2014-03-10 LAB — BASIC METABOLIC PANEL
BUN: 26 mg/dL — AB (ref 6–23)
CO2: 18 meq/L — AB (ref 19–32)
Calcium: 9.6 mg/dL (ref 8.4–10.5)
Chloride: 99 mEq/L (ref 96–112)
Creatinine, Ser: 0.82 mg/dL (ref 0.50–1.10)
GFR calc Af Amer: 75 mL/min — ABNORMAL LOW (ref >90)
GFR, EST NON AFRICAN AMERICAN: 65 mL/min — AB (ref >90)
GLUCOSE: 94 mg/dL (ref 70–99)
Potassium: 3.7 mEq/L (ref 3.7–5.3)
Sodium: 136 mEq/L — ABNORMAL LOW (ref 137–147)

## 2014-03-10 LAB — CBC
HCT: 43.4 % (ref 36.0–46.0)
Hemoglobin: 15.3 g/dL — ABNORMAL HIGH (ref 12.0–15.0)
MCH: 31.4 pg (ref 26.0–34.0)
MCHC: 35.3 g/dL (ref 30.0–36.0)
MCV: 89.1 fL (ref 78.0–100.0)
Platelets: 191 10*3/uL (ref 150–400)
RBC: 4.87 MIL/uL (ref 3.87–5.11)
RDW: 13.4 % (ref 11.5–15.5)
WBC: 15.4 10*3/uL — ABNORMAL HIGH (ref 4.0–10.5)

## 2014-03-10 LAB — I-STAT TROPONIN, ED: Troponin i, poc: 0 ng/mL (ref 0.00–0.08)

## 2014-03-10 LAB — GLUCOSE, CAPILLARY
Glucose-Capillary: 98 mg/dL (ref 70–99)
Glucose-Capillary: 182 mg/dL — ABNORMAL HIGH (ref 70–99)

## 2014-03-10 LAB — MRSA PCR SCREENING: MRSA BY PCR: NEGATIVE

## 2014-03-10 LAB — PROTIME-INR
INR: 1.98 — AB (ref 0.00–1.49)
Prothrombin Time: 21.9 seconds — ABNORMAL HIGH (ref 11.6–15.2)

## 2014-03-10 LAB — D-DIMER, QUANTITATIVE (NOT AT ARMC)

## 2014-03-10 LAB — PRO B NATRIURETIC PEPTIDE: Pro B Natriuretic peptide (BNP): 728.1 pg/mL — ABNORMAL HIGH (ref 0–450)

## 2014-03-10 MED ORDER — INSULIN ASPART 100 UNIT/ML ~~LOC~~ SOLN: 0.0000 [IU] | Freq: Every day | SUBCUTANEOUS | Status: DC

## 2014-03-10 MED ORDER — PANTOPRAZOLE SODIUM 40 MG PO TBEC
40.0000 mg | DELAYED_RELEASE_TABLET | Freq: Every day | ORAL | Status: DC
  Administered 2014-03-10 – 2014-03-12 (×3): 40 mg via ORAL
  Filled 2014-03-10 (×3): qty 1

## 2014-03-10 MED ORDER — LEVALBUTEROL HCL 0.63 MG/3ML IN NEBU: 0.6300 mg | INHALATION_SOLUTION | Freq: Four times a day (QID) | RESPIRATORY_TRACT | Status: DC | PRN

## 2014-03-10 MED ORDER — HYDROCODONE-ACETAMINOPHEN 5-325 MG PO TABS: 1.0000 | ORAL_TABLET | Freq: Four times a day (QID) | ORAL | Status: DC | PRN

## 2014-03-10 MED ORDER — SODIUM CHLORIDE 0.9 % IV SOLN
INTRAVENOUS | Status: DC
  Administered 2014-03-10: 1000 mL via INTRAVENOUS
  Administered 2014-03-11: 10:00:00 via INTRAVENOUS

## 2014-03-10 MED ORDER — IPRATROPIUM BROMIDE 0.02 % IN SOLN
0.5000 mg | Freq: Once | RESPIRATORY_TRACT | Status: DC
  Filled 2014-03-10: qty 2.5

## 2014-03-10 MED ORDER — ZOLPIDEM TARTRATE 5 MG PO TABS
5.0000 mg | ORAL_TABLET | Freq: Every evening | ORAL | Status: DC | PRN
  Administered 2014-03-10 – 2014-03-11 (×2): 5 mg via ORAL
  Filled 2014-03-10 (×2): qty 1

## 2014-03-10 MED ORDER — SERTRALINE HCL 50 MG PO TABS
50.0000 mg | ORAL_TABLET | Freq: Every evening | ORAL | Status: DC
  Administered 2014-03-10 – 2014-03-11 (×2): 50 mg via ORAL
  Filled 2014-03-10 (×3): qty 1

## 2014-03-10 MED ORDER — WARFARIN SODIUM 2.5 MG PO TABS
2.5000 mg | ORAL_TABLET | Freq: Once | ORAL | Status: AC
  Administered 2014-03-10: 2.5 mg via ORAL
  Filled 2014-03-10: qty 1

## 2014-03-10 MED ORDER — ALPRAZOLAM 0.5 MG PO TABS
0.5000 mg | ORAL_TABLET | Freq: Four times a day (QID) | ORAL | Status: DC | PRN
  Administered 2014-03-10 – 2014-03-11 (×2): 0.5 mg via ORAL
  Filled 2014-03-10 (×2): qty 1

## 2014-03-10 MED ORDER — ADULT MULTIVITAMIN W/MINERALS CH
1.0000 | ORAL_TABLET | Freq: Every day | ORAL | Status: DC
  Administered 2014-03-10 – 2014-03-12 (×3): 1 via ORAL
  Filled 2014-03-10 (×3): qty 1

## 2014-03-10 MED ORDER — ONDANSETRON HCL 4 MG PO TABS: 4.0000 mg | ORAL_TABLET | Freq: Four times a day (QID) | ORAL | Status: DC | PRN

## 2014-03-10 MED ORDER — ONDANSETRON HCL 4 MG/2ML IJ SOLN: 4.0000 mg | Freq: Four times a day (QID) | INTRAMUSCULAR | Status: DC | PRN

## 2014-03-10 MED ORDER — INSULIN GLARGINE 100 UNIT/ML SOLOSTAR PEN: 28.0000 [IU] | PEN_INJECTOR | Freq: Every day | SUBCUTANEOUS | Status: DC

## 2014-03-10 MED ORDER — DILTIAZEM HCL 100 MG IV SOLR
5.0000 mg/h | INTRAVENOUS | Status: DC
  Administered 2014-03-10: 5 mg/h via INTRAVENOUS
  Administered 2014-03-10: 10 mg/h via INTRAVENOUS

## 2014-03-10 MED ORDER — INSULIN ASPART 100 UNIT/ML ~~LOC~~ SOLN
0.0000 [IU] | Freq: Three times a day (TID) | SUBCUTANEOUS | Status: DC
Start: 2014-03-11 — End: 2014-03-12
  Administered 2014-03-11: 2 [IU] via SUBCUTANEOUS
  Administered 2014-03-11: 3 [IU] via SUBCUTANEOUS
  Administered 2014-03-12: 2 [IU] via SUBCUTANEOUS

## 2014-03-10 MED ORDER — IPRATROPIUM BROMIDE 0.02 % IN SOLN
0.5000 mg | Freq: Two times a day (BID) | RESPIRATORY_TRACT | Status: DC
  Administered 2014-03-11: 0.5 mg via RESPIRATORY_TRACT
  Filled 2014-03-10: qty 2.5

## 2014-03-10 MED ORDER — ALBUTEROL SULFATE (2.5 MG/3ML) 0.083% IN NEBU
5.0000 mg | INHALATION_SOLUTION | Freq: Once | RESPIRATORY_TRACT | Status: DC
  Filled 2014-03-10: qty 6

## 2014-03-10 MED ORDER — WARFARIN - PHARMACIST DOSING INPATIENT: Freq: Every day | Status: DC

## 2014-03-10 MED ORDER — INSULIN GLARGINE 100 UNIT/ML ~~LOC~~ SOLN
28.0000 [IU] | Freq: Every day | SUBCUTANEOUS | Status: DC
  Administered 2014-03-11 – 2014-03-12 (×2): 28 [IU] via SUBCUTANEOUS
  Filled 2014-03-10 (×2): qty 0.28

## 2014-03-10 MED ORDER — PREDNISONE 20 MG PO TABS
40.0000 mg | ORAL_TABLET | Freq: Every day | ORAL | Status: DC
  Administered 2014-03-10 – 2014-03-12 (×3): 40 mg via ORAL
  Filled 2014-03-10 (×4): qty 2

## 2014-03-10 MED ORDER — SACCHAROMYCES BOULARDII 250 MG PO CAPS
250.0000 mg | ORAL_CAPSULE | Freq: Two times a day (BID) | ORAL | Status: DC
  Administered 2014-03-10 – 2014-03-12 (×4): 250 mg via ORAL
  Filled 2014-03-10 (×5): qty 1

## 2014-03-10 MED ORDER — VANCOMYCIN 50 MG/ML ORAL SOLUTION
125.0000 mg | ORAL | Status: DC
  Administered 2014-03-10: 125 mg via ORAL
  Filled 2014-03-10: qty 2.5

## 2014-03-10 MED ORDER — BUDESONIDE-FORMOTEROL FUMARATE 160-4.5 MCG/ACT IN AERO
2.0000 | INHALATION_SPRAY | Freq: Two times a day (BID) | RESPIRATORY_TRACT | Status: DC
  Administered 2014-03-10 – 2014-03-12 (×4): 2 via RESPIRATORY_TRACT
  Filled 2014-03-10: qty 6

## 2014-03-10 MED ORDER — METHYLPHENIDATE HCL 5 MG PO TABS
5.0000 mg | ORAL_TABLET | ORAL | Status: DC
  Administered 2014-03-11 – 2014-03-12 (×3): 5 mg via ORAL
  Filled 2014-03-10 (×3): qty 1

## 2014-03-10 MED ORDER — POLYETHYLENE GLYCOL 3350 17 G PO PACK
17.0000 g | PACK | Freq: Every day | ORAL | Status: DC | PRN
  Filled 2014-03-10: qty 1

## 2014-03-10 MED ORDER — METOPROLOL TARTRATE 25 MG PO TABS
25.0000 mg | ORAL_TABLET | Freq: Two times a day (BID) | ORAL | Status: DC
  Administered 2014-03-10 – 2014-03-12 (×4): 25 mg via ORAL
  Filled 2014-03-10 (×5): qty 1

## 2014-03-10 MED ORDER — ALBUTEROL SULFATE (2.5 MG/3ML) 0.083% IN NEBU
2.5000 mg | INHALATION_SOLUTION | RESPIRATORY_TRACT | Status: DC
  Administered 2014-03-10: 2.5 mg via RESPIRATORY_TRACT
  Filled 2014-03-10: qty 3

## 2014-03-10 MED ORDER — DILTIAZEM HCL 30 MG PO TABS
30.0000 mg | ORAL_TABLET | Freq: Four times a day (QID) | ORAL | Status: DC
  Administered 2014-03-10 – 2014-03-12 (×8): 30 mg via ORAL
  Filled 2014-03-10 (×11): qty 1

## 2014-03-10 MED ORDER — SODIUM CHLORIDE 0.9 % IJ SOLN
3.0000 mL | Freq: Two times a day (BID) | INTRAMUSCULAR | Status: DC
Start: 2014-03-10 — End: 2014-03-12
  Administered 2014-03-11 (×2): 3 mL via INTRAVENOUS

## 2014-03-10 MED ORDER — ALUM & MAG HYDROXIDE-SIMETH 200-200-20 MG/5ML PO SUSP: 30.0000 mL | Freq: Four times a day (QID) | ORAL | Status: DC | PRN

## 2014-03-10 MED ORDER — NITROGLYCERIN 0.4 MG SL SUBL
0.4000 mg | SUBLINGUAL_TABLET | SUBLINGUAL | Status: DC | PRN
  Administered 2014-03-10: 0.4 mg via SUBLINGUAL
  Filled 2014-03-10: qty 1

## 2014-03-10 MED ORDER — ASPIRIN 81 MG PO CHEW: 324.0000 mg | CHEWABLE_TABLET | Freq: Once | ORAL | Status: DC

## 2014-03-10 MED ORDER — LATANOPROST 0.005 % OP SOLN
1.0000 [drp] | Freq: Every day | OPHTHALMIC | Status: DC
  Administered 2014-03-10 – 2014-03-11 (×2): 1 [drp] via OPHTHALMIC
  Filled 2014-03-10: qty 2.5

## 2014-03-10 MED ORDER — LEVALBUTEROL HCL 0.63 MG/3ML IN NEBU
0.6300 mg | INHALATION_SOLUTION | Freq: Two times a day (BID) | RESPIRATORY_TRACT | Status: DC
  Administered 2014-03-11 – 2014-03-12 (×3): 0.63 mg via RESPIRATORY_TRACT
  Filled 2014-03-10 (×6): qty 3

## 2014-03-10 MED ORDER — WARFARIN SODIUM 2.5 MG PO TABS: 2.5000 mg | ORAL_TABLET | Freq: Once | ORAL | Status: DC

## 2014-03-10 NOTE — Progress Notes (Signed)
ANTICOAGULATION CONSULT NOTE - Initial Consult  Pharmacy Consult for warfarin Indication: atrial fibrillation  Allergies  Allergen Reactions  . Lactose Intolerance (Gi) Nausea And Vomiting    severe stomach pain  . Penicillins Hives and Itching    "haven't used any since 1970's"  . Sulfa Antibiotics Hives and Itching    "haven't used any since 1970's"  . Ciprofloxacin Swelling    Patient Measurements: Height: 5\' 2"  (157.5 cm) Weight: 142 lb 12.8 oz (64.774 kg) IBW/kg (Calculated) : 50.1  Vital Signs: Temp: 97.7 F (36.5 C) (06/06 1724) Temp src: Oral (06/06 1724) BP: 131/71 mmHg (06/06 1600) Pulse Rate: 76 (06/06 1600)  Labs:  Recent Labs  03/10/14 1309  HGB 15.3*  HCT 43.4  PLT 191  LABPROT 21.9*  INR 1.98*  CREATININE 0.82    Estimated Creatinine Clearance: 46.8 ml/min (by C-G formula based on Cr of 0.82).   Medical History: Past Medical History  Diagnosis Date  . Pancreatitis     ~2008, 11/2011 post ERCP  . Hypertension   . Glaucoma   . High cholesterol   . GERD (gastroesophageal reflux disease)     with HH  . Atrial fibrillation     RVR hx.  Chronic Coumadin  . Pneumonia 12/2011    "first time I know about"  . Type II diabetes mellitus   . Depression   . Chronic lower back pain   . COPD (chronic obstructive pulmonary disease)   . HCAP (healthcare-associated pneumonia) 11/21/2011  . Mitral regurgitation 03/11/2012  . Metabolic encephalopathy 06/12/9891  . E-coli UTI 11/13/2011, 11/2013  . SIRS (systemic inflammatory response syndrome) 03/13/2012    a/w post ERCP  pancreatitis.   . C. difficile colitis 09/21/2013, 11/2013  . Carotid stenosis     a. Carotid US (10/2013):  bilat 1-39%; f/u 1 year  . Sepsis 11/27/2013  . UTI (lower urinary tract infection) 11/23/2013    Assessment: 84 YOF admitted with chest pressure, tightness and dyspnea that started this morning. troponins negative, EKG unremarkable, and CP resolved with nitro. Home dose is 2.5mg  daily  except 5mg  on Mondays and Fridays. Admission INR is 1.98. Hgb 15.3, platelets 191. No bleeding noted  Goal of Therapy:  INR 2-3 Monitor platelets by anticoagulation protocol: Yes   Plan:  1. Will continue home dose of warfarin and order 2.5mg  po x1 tonight.  2. Daily PT/INR- if remains stable on home dosing, can go to 3x week 3. Follow for s/s bleeding  Katrinia Straker D. Icie Kuznicki, PharmD, BCPS Clinical Pharmacist Pager: [phone] 03/10/2014 6:04 PM

## 2014-03-10 NOTE — ED Notes (Signed)
Pt givem 324 asa by ems. No IV access so no nitro

## 2014-03-10 NOTE — ED Notes (Signed)
Holding breathing treatment due to patients fast HR.

## 2014-03-10 NOTE — ED Notes (Signed)
HR 108

## 2014-03-10 NOTE — ED Notes (Signed)
PT BP dropped from 137/77 to 105/48 with 1 nitro. Second nitro not given.

## 2014-03-10 NOTE — ED Notes (Signed)
Per [facility], pt from home. Pt woke up this morning feeling SOB and chest tightness in center of chest, no radiating. Pt described it as "something needing to burp up". Chest tightness subsided enroute. Hx of COPD. Pt took couple puffs of her inhaler, with some relief. Pt is tachypnic. Pt hx of afib. Rate between 90s-140s.

## 2014-03-10 NOTE — ED Provider Notes (Signed)
CSN: [id number]     Arrival date & time 03/10/14  1152 History   First MD Initiated Contact with Patient 03/10/14 1214     Chief Complaint  Patient presents with  . Shortness of Breath  . Chest Pain     (Consider location/radiation/quality/duration/timing/severity/associated sxs/prior Treatment) HPI Pt presenting with c/o feeling short of breath and tightness in her chest.  Symptoms began this morning.  Pt is a poor historian, so history is very limited.  She states she feels short of breath and fatigued all the time, but today was much worse.  Symptoms are constant.  No leg swelling.  No fever/chills.  No cough.  Pt has not had any treatment prior to arrival.   Past Medical History  Diagnosis Date  . Pancreatitis     ~2008, 11/2011 post ERCP  . Hypertension   . Glaucoma   . High cholesterol   . GERD (gastroesophageal reflux disease)     with HH  . Atrial fibrillation     RVR hx.  Chronic Coumadin  . Pneumonia 12/2011    "first time I know about"  . Type II diabetes mellitus   . Depression   . Chronic lower back pain   . COPD (chronic obstructive pulmonary disease)   . HCAP (healthcare-associated pneumonia) 11/21/2011  . Mitral regurgitation 03/11/2012  . Metabolic encephalopathy 01/03/9378  . E-coli UTI 11/13/2011, 11/2013  . SIRS (systemic inflammatory response syndrome) 03/13/2012    a/w post ERCP  pancreatitis.   . C. difficile colitis 09/21/2013, 11/2013  . Carotid stenosis     a. Carotid US (10/2013):  bilat 1-39%; f/u 1 year  . Sepsis 11/27/2013  . UTI (lower urinary tract infection) 11/23/2013   Past Surgical History  Procedure Laterality Date  . Ercp  11/13/2011    Procedure: ENDOSCOPIC RETROGRADE CHOLANGIOPANCREATOGRAPHY (ERCP);  Surgeon: Beryle Beams, MD;  Location: [facility] ENDOSCOPY;  Service: Endoscopy;  Laterality: N/A;  . Tonsillectomy and adenoidectomy  ~ 1939  . Cholecystectomy  1990's    Lap chole  . Appendectomy  ~ 1960  . Vaginal hysterectomy  ~ 1960's  . Dilation  and curettage of uterus    . Cataract extraction w/ intraocular lens  implant, bilateral  2000's  . Colonoscopy  01/2003    diverticulosis, 36mm hyperplastic sigmoid polyp   Family History  Problem Relation Age of Onset  . Anesthesia problems Neg Hx   . Hypotension Neg Hx   . Malignant hyperthermia Neg Hx   . Pseudochol deficiency Neg Hx   . Stroke Father 98  . Stroke Mother 43   History  Substance Use Topics  . Smoking status: Never Smoker   . Smokeless tobacco: Never Used  . Alcohol Use: No   OB History   Grav Para Term Preterm Abortions TAB SAB Ect Mult Living                 Review of Systems ROS reviewed and all otherwise negative except for mentioned in HPI    Allergies  Lactose intolerance (gi); Penicillins; Sulfa antibiotics; and Ciprofloxacin  Home Medications   Prior to Admission medications   Medication Sig Start Date End Date Taking? Authorizing Provider  albuterol (ACCUNEB) 0.63 MG/3ML nebulizer solution Take 1 ampule by nebulization 2 (two) times daily.   Yes Historical Provider, MD  ALPRAZolam Duanne Moron) 1 MG tablet Take 0.5 tablets (0.5 mg total) by mouth 4 (four) times daily as needed for anxiety. For sleep or anxiety. 04/18/12  Yes  Bynum Bellows, MD  diltiazem (CARDIZEM CD) 120 MG 24 hr capsule Take 1 capsule (120 mg total) by mouth daily. 05/24/13 05/24/14 Yes Minus Breeding, MD  glimepiride (AMARYL) 4 MG tablet Take 4 mg by mouth daily with breakfast.    Yes Historical Provider, MD  HYDROcodone-acetaminophen (VICODIN) 5-500 MG per tablet Take 1 tablet by mouth every 6 (six) hours as needed for pain.   Yes Historical Provider, MD  ipratropium (ATROVENT) 0.02 % nebulizer solution Take 0.5 mg by nebulization 2 (two) times daily.   Yes Historical Provider, MD  LANTUS SOLOSTAR 100 UNIT/ML Solostar Pen Inject 28 Units into the skin daily.  09/01/13  Yes Historical Provider, MD  latanoprost (XALATAN) 0.005 % ophthalmic solution Place 1 drop into both eyes at bedtime.    Yes Historical Provider, MD  methylphenidate (RITALIN) 5 MG tablet Take 5 mg by mouth 2 (two) times daily.   Yes Historical Provider, MD  metoprolol tartrate (LOPRESSOR) 25 MG tablet Take 25 mg by mouth 2 (two) times daily.   Yes Historical Provider, MD  Multiple Vitamin (MULTIVITAMIN WITH MINERALS) TABS Take 1 tablet by mouth daily.   Yes Historical Provider, MD  omeprazole (PRILOSEC) 20 MG capsule Take 20 mg by mouth 2 (two) times daily.    Yes Historical Provider, MD  saccharomyces boulardii (FLORASTOR) 250 MG capsule Take one po BID x 6 weeks 11/15/13  Yes Amy S Esterwood, PA-C  sertraline (ZOLOFT) 50 MG tablet Take 50 mg by mouth every evening.   Yes Historical Provider, MD  SYMBICORT 160-4.5 MCG/ACT inhaler Inhale 2 puffs into the lungs 2 (two) times daily.  03/08/14  Yes Historical Provider, MD  vancomycin (VANCOCIN) 50 mg/mL oral solution Take 2.5 mLs (125 mg total) by mouth 4 (four) times daily. And as directed 01/02/14  Yes Gatha Mayer, MD  warfarin (COUMADIN) 5 MG tablet Take 2.5-5 mg by mouth daily. Take 5 mg every Monday and Friday then 2.5 mg the rest of the week   Yes Historical Provider, MD  zolpidem (AMBIEN) 5 MG tablet Take 5 mg by mouth at bedtime as needed for sleep.   Yes Historical Provider, MD   BP 117/55  Pulse 92  Temp(Src) 97.6 F (36.4 C) (Oral)  Resp 22  Ht 5' (1.524 m)  SpO2 98% Vitals reviewed Physical Exam Physical Examination: General appearance - alert, well appearing, and in no distress Mental status - alert, oriented to person, place, and time Eyes - no conjunctival injection, no scleral icterus Mouth - mucous membranes moist, pharynx normal without lesions Chest - clear to auscultation, no wheezes, rales or rhonchi, symmetric air entry Heart - normal rate, regular rhythm, normal S1, S2, no murmurs, rubs, clicks or gallops Abdomen - soft, nontender, nondistended, no masses or organomegaly Extremities - peripheral pulses normal, no pedal edema, no  clubbing or cyanosis Skin - normal coloration and turgor, no rashes  ED Course  Procedures (including critical care time)  2:11 PM d/w Dr. Allie Bossier, [facility], he has seen patient in the ED and will admit to stepdown bed.   CRITICAL CARE Performed by: Threasa Beards Total critical care time: 40 Critical care time was exclusive of separately billable procedures and treating other patients. Critical care was necessary to treat or prevent imminent or life-threatening deterioration. Critical care was time spent personally by me on the following activities: development of treatment plan with patient and/or surrogate as well as nursing, discussions with consultants, evaluation of patient's response to treatment,  examination of patient, obtaining history from patient or surrogate, ordering and performing treatments and interventions, ordering and review of laboratory studies, ordering and review of radiographic studies, pulse oximetry and re-evaluation of patient's condition. Labs Review Labs Reviewed  CBC - Abnormal; Notable for the following:    WBC 15.4 (*)    Hemoglobin 15.3 (*)    All other components within normal limits  BASIC METABOLIC PANEL - Abnormal; Notable for the following:    Sodium 136 (*)    CO2 18 (*)    BUN 26 (*)    GFR calc non Af Amer 65 (*)    GFR calc Af Amer 75 (*)    All other components within normal limits  PRO B NATRIURETIC PEPTIDE - Abnormal; Notable for the following:    Pro B Natriuretic peptide (BNP) 728.1 (*)    All other components within normal limits  PROTIME-INR - Abnormal; Notable for the following:    Prothrombin Time 21.9 (*)    INR 1.98 (*)    All other components within normal limits  D-DIMER, QUANTITATIVE  I-STAT TROPOININ, ED    Imaging Review Dg Chest Port 1 View  03/10/2014   CLINICAL DATA:  Shortness of breath and chest pain.  EXAM: PORTABLE CHEST - 1 VIEW  COMPARISON:  10/16/2011 and 04/11/2012  FINDINGS: Lungs are  adequately inflated as patient is slightly rotated to the right. There is no definite focal consolidation or effusion. Cardiomediastinal silhouette is within normal. There is calcification over the mitral valve anulus as well as calcification over the aortic arch. Remainder the exam is unchanged.  IMPRESSION: No acute cardiopulmonary disease.   Electronically Signed   By: Marin Olp M.D.   On: 03/10/2014 13:31     EKG Interpretation   Date/Time:  Saturday March 10 2014 11:58:50 EDT Ventricular Rate:  106 PR Interval:    QRS Duration: 78 QT Interval:  335 QTC Calculation: 445 R Axis:   44 Text Interpretation:  Atrial fibrillation with rapid ventricular response  No significant change since last tracing Confirmed by [facility]  MD, MARTHA  [phone]) on 03/10/2014 3:24:16 PM      MDM   Final diagnoses:  Atrial fibrillation with rapid ventricular response  Shortness of breath  Chest discomfort    Pt presenting with c/o shortrness of breath and chest discomfort.  Pt in afib in 120s.  Pt treated with nitroglycerin, diltiazem drip.  Pt admited to stepdown bed to [facility].      Threasa Beards, MD 03/10/14 [id number]

## 2014-03-10 NOTE — Progress Notes (Signed)
Patient admitted from Ed at 1710 via stretcher with nurse on telemerty. Patient alert and oriented x4. Patient  On cardizem infusion at 10mg Rudean Hitt

## 2014-03-10 NOTE — H&P (Signed)
Physician Admission History and Physical     PCP:   Donnajean Lopes, MD   Chief Complaint:  Chest tightness, feeling short of breath   HPI: [name] [name] is an 80 y.o. female.  Pt w/ hx of COPD and A fib presents w/ chest pressure, tightness and dyspnea that started this AM. The CP is atypical and resolved w/ nitro. Nitro in ED did cause a drop in BP that recovered spontaneously, and Trop and EKG were unremarkable for ACS changes. She was noted to be in RVR w/ HR into the 120s that recovered to 90-100s on dilt ggt, but did require 1 10mg  bolus of dilt. Labs only showed mild leukocytosis. BNP mildly elevated, she is on lasix 3x per week per centricity. CXR unremarkable. She is on po vanc every 3rd day until end of June to complete treatment of c diff.   She is currently resting comfortably w/ no increased O2 requirement but complains of dyspnea   She will be admitted for a fib w/ RVR and possible COPD exacerbation   Review of Systems:  ROS neg except as noted above    Past Medical History: Past Medical History  Diagnosis Date  . Pancreatitis     ~2008, 11/2011 post ERCP  . Hypertension   . Glaucoma   . High cholesterol   . GERD (gastroesophageal reflux disease)     with HH  . Atrial fibrillation     RVR hx.  Chronic Coumadin  . Pneumonia 12/2011    "first time I know about"  . Type II diabetes mellitus   . Depression   . Chronic lower back pain   . COPD (chronic obstructive pulmonary disease)   . HCAP (healthcare-associated pneumonia) 11/21/2011  . Mitral regurgitation 03/11/2012  . Metabolic encephalopathy 06/07/7168  . E-coli UTI 11/13/2011, 11/2013  . SIRS (systemic inflammatory response syndrome) 03/13/2012    a/w post ERCP  pancreatitis.   . C. difficile colitis 09/21/2013, 11/2013  . Carotid stenosis     a. Carotid US (10/2013):  bilat 1-39%; f/u 1 year  . Sepsis 11/27/2013  . UTI (lower urinary tract infection) 11/23/2013   Past Surgical History  Procedure Laterality  Date  . Ercp  11/13/2011    Procedure: ENDOSCOPIC RETROGRADE CHOLANGIOPANCREATOGRAPHY (ERCP);  Surgeon: Beryle Beams, MD;  Location: [facility] ENDOSCOPY;  Service: Endoscopy;  Laterality: N/A;  . Tonsillectomy and adenoidectomy  ~ 1939  . Cholecystectomy  1990's    Lap chole  . Appendectomy  ~ 1960  . Vaginal hysterectomy  ~ 1960's  . Dilation and curettage of uterus    . Cataract extraction w/ intraocular lens  implant, bilateral  2000's  . Colonoscopy  01/2003    diverticulosis, 110mm hyperplastic sigmoid polyp    Medications: Prior to Admission medications   Medication Sig Start Date End Date Taking? Authorizing Provider  albuterol (ACCUNEB) 0.63 MG/3ML nebulizer solution Take 1 ampule by nebulization 2 (two) times daily.   Yes Historical Provider, MD  ALPRAZolam Duanne Moron) 1 MG tablet Take 0.5 tablets (0.5 mg total) by mouth 4 (four) times daily as needed for anxiety. For sleep or anxiety. 04/18/12  Yes Srikar Janna Arch, MD  diltiazem (CARDIZEM CD) 120 MG 24 hr capsule Take 1 capsule (120 mg total) by mouth daily. 05/24/13 05/24/14 Yes Minus Breeding, MD  glimepiride (AMARYL) 4 MG tablet Take 4 mg by mouth daily with breakfast.    Yes Historical Provider, MD  HYDROcodone-acetaminophen (VICODIN) 5-500 MG per tablet Take 1 tablet  by mouth every 6 (six) hours as needed for pain.   Yes Historical Provider, MD  ipratropium (ATROVENT) 0.02 % nebulizer solution Take 0.5 mg by nebulization 2 (two) times daily.   Yes Historical Provider, MD  LANTUS SOLOSTAR 100 UNIT/ML Solostar Pen Inject 28 Units into the skin daily.  09/01/13  Yes Historical Provider, MD  latanoprost (XALATAN) 0.005 % ophthalmic solution Place 1 drop into both eyes at bedtime.   Yes Historical Provider, MD  methylphenidate (RITALIN) 5 MG tablet Take 5 mg by mouth 2 (two) times daily.   Yes Historical Provider, MD  metoprolol tartrate (LOPRESSOR) 25 MG tablet Take 25 mg by mouth 2 (two) times daily.   Yes Historical Provider, MD  Multiple  Vitamin (MULTIVITAMIN WITH MINERALS) TABS Take 1 tablet by mouth daily.   Yes Historical Provider, MD  omeprazole (PRILOSEC) 20 MG capsule Take 20 mg by mouth 2 (two) times daily.    Yes Historical Provider, MD  saccharomyces boulardii (FLORASTOR) 250 MG capsule Take one po BID x 6 weeks 11/15/13  Yes Amy S Esterwood, PA-C  sertraline (ZOLOFT) 50 MG tablet Take 50 mg by mouth every evening.   Yes Historical Provider, MD  SYMBICORT 160-4.5 MCG/ACT inhaler Inhale 2 puffs into the lungs 2 (two) times daily.  03/08/14  Yes Historical Provider, MD  vancomycin (VANCOCIN) 50 mg/mL oral solution Take 2.5 mLs (125 mg total) by mouth 4 (four) times daily. And as directed 01/02/14  Yes Gatha Mayer, MD  warfarin (COUMADIN) 5 MG tablet Take 2.5-5 mg by mouth daily. Take 5 mg every Monday and Friday then 2.5 mg the rest of the week   Yes Historical Provider, MD  zolpidem (AMBIEN) 5 MG tablet Take 5 mg by mouth at bedtime as needed for sleep.   Yes Historical Provider, MD    Allergies:   Allergies  Allergen Reactions  . Lactose Intolerance (Gi) Nausea And Vomiting    severe stomach pain  . Penicillins Hives and Itching    "haven't used any since 1970's"  . Sulfa Antibiotics Hives and Itching    "haven't used any since 1970's"  . Ciprofloxacin Swelling    Social History:  reports that she has never smoked. She has never used smokeless tobacco. She reports that she does not drink alcohol or use illicit drugs.  Family History: Family History  Problem Relation Age of Onset  . Anesthesia problems Neg Hx   . Hypotension Neg Hx   . Malignant hyperthermia Neg Hx   . Pseudochol deficiency Neg Hx   . Stroke Father 62  . Stroke Mother 39    Physical Exam: Filed Vitals:   03/10/14 1304 03/10/14 1315 03/10/14 1330 03/10/14 1415  BP: 105/48 118/65 121/71 133/85  Pulse: 101 103 92 107  Temp:      TempSrc:      Resp: 24 26 24 15   Height:      SpO2: 98% 95% 98% 99%   Gen: WF in NAD  HEENT: MMM,  anicteric  Lungs: CTAB, mild exp wheezes throughout , no rales  Cardio:  Ir/ir, tachycardic, no MRG  Abd:  Soft, NT , ND  MSK:  No focal deficits  Neuro:  No focal deficits  Skin: warm, dry , 1+ pitting edema B/L     Labs on Admission:   Recent Labs  03/10/14 1309  NA 136*  K 3.7  CL 99  CO2 18*  GLUCOSE 94  BUN 26*  CREATININE 0.82  CALCIUM 9.6  No results found for this basename: AST, ALT, ALKPHOS, BILITOT, PROT, ALBUMIN,  in the last 72 hours No results found for this basename: LIPASE, AMYLASE,  in the last 72 hours  Recent Labs  03/10/14 1309  WBC 15.4*  HGB 15.3*  HCT 43.4  MCV 89.1  PLT 191   No results found for this basename: CKTOTAL, CKMB, CKMBINDEX, TROPONINI,  in the last 72 hours Lab Results  Component Value Date   INR 1.98* 03/10/2014   INR 2.50* 11/27/2013   INR 1.94* 11/26/2013   No results found for this basename: TSH, T4TOTAL, FREET3, T3FREE, THYROIDAB,  in the last 72 hours No results found for this basename: VITAMINB12, FOLATE, FERRITIN, TIBC, IRON, RETICCTPCT,  in the last 72 hours  Radiological Exams on Admission: Dg Chest Port 1 View  03/10/2014   CLINICAL DATA:  Shortness of breath and chest pain.  EXAM: PORTABLE CHEST - 1 VIEW  COMPARISON:  10/16/2011 and 04/11/2012  FINDINGS: Lungs are adequately inflated as patient is slightly rotated to the right. There is no definite focal consolidation or effusion. Cardiomediastinal silhouette is within normal. There is calcification over the mitral valve anulus as well as calcification over the aortic arch. Remainder the exam is unchanged.  IMPRESSION: No acute cardiopulmonary disease.   Electronically Signed   By: Marin Olp M.D.   On: 03/10/2014 13:31   Orders placed during the hospital encounter of 03/10/14  . EKG 12-LEAD  . EKG 12-LEAD  . ED EKG  . ED EKG    Assessment/Plan  COPD exacerbation - alb nebs q6hrs. symbicort BID. pred 40 qd. Supplement O2 prn   A fib - resume home meds. dilt ggt  to be stopped when able. Repeat EKG in AM . Coumadin per pharm .   DM - home lantus dose +SSI   HTN/HLD - home meds   PPx - coumadin  Dispo - home when stable    [facility] 03/10/2014, 2:43 PM

## 2014-03-10 NOTE — ED Notes (Addendum)
Pt became upset, HR went up to 150. Pt given 10mg  bolus per Dr. Canary Brim.

## 2014-03-11 LAB — BASIC METABOLIC PANEL
BUN: 24 mg/dL — ABNORMAL HIGH (ref 6–23)
CHLORIDE: 102 meq/L (ref 96–112)
CO2: 20 mEq/L (ref 19–32)
Calcium: 9.2 mg/dL (ref 8.4–10.5)
Creatinine, Ser: 0.84 mg/dL (ref 0.50–1.10)
GFR calc non Af Amer: 63 mL/min — ABNORMAL LOW (ref >90)
GFR, EST AFRICAN AMERICAN: 73 mL/min — AB (ref >90)
Glucose, Bld: 184 mg/dL — ABNORMAL HIGH (ref 70–99)
POTASSIUM: 4.5 meq/L (ref 3.7–5.3)
SODIUM: 137 meq/L (ref 137–147)

## 2014-03-11 LAB — CBC
HEMATOCRIT: 42.8 % (ref 36.0–46.0)
HEMOGLOBIN: 14.7 g/dL (ref 12.0–15.0)
MCH: 31.1 pg (ref 26.0–34.0)
MCHC: 34.3 g/dL (ref 30.0–36.0)
MCV: 90.5 fL (ref 78.0–100.0)
Platelets: 165 10*3/uL (ref 150–400)
RBC: 4.73 MIL/uL (ref 3.87–5.11)
RDW: 13.6 % (ref 11.5–15.5)
WBC: 11 10*3/uL — AB (ref 4.0–10.5)

## 2014-03-11 LAB — GLUCOSE, CAPILLARY
GLUCOSE-CAPILLARY: 164 mg/dL — AB (ref 70–99)
Glucose-Capillary: 153 mg/dL — ABNORMAL HIGH (ref 70–99)
Glucose-Capillary: 139 mg/dL — ABNORMAL HIGH (ref 70–99)
Glucose-Capillary: 196 mg/dL — ABNORMAL HIGH (ref 70–99)

## 2014-03-11 LAB — HEMOGLOBIN A1C
HEMOGLOBIN A1C: 7.1 % — AB (ref <5.7)
Mean Plasma Glucose: 157 mg/dL — ABNORMAL HIGH (ref <117)

## 2014-03-11 LAB — PROTIME-INR
INR: 2.1 — ABNORMAL HIGH (ref 0.00–1.49)
PROTHROMBIN TIME: 22.9 s — AB (ref 11.6–15.2)

## 2014-03-11 MED ORDER — WARFARIN SODIUM 5 MG PO TABS
5.0000 mg | ORAL_TABLET | ORAL | Status: DC
  Filled 2014-03-11: qty 1

## 2014-03-11 MED ORDER — WARFARIN SODIUM 2.5 MG PO TABS
2.5000 mg | ORAL_TABLET | ORAL | Status: DC
  Administered 2014-03-11: 2.5 mg via ORAL
  Filled 2014-03-11: qty 1

## 2014-03-11 NOTE — Progress Notes (Addendum)
Physician Daily Progress Note  Subjective: Much improved this AM  Minimal dyspnea Rate controlled  No dilt ggt since 8P last night   Objective: Vital signs in last 24 hours: Temp:  [97.6 F (36.4 C)-98.6 F (37 C)] 98.6 F (37 C) (06/07 0804) Pulse Rate:  [74-121] 84 (06/07 0300) Resp:  [12-28] 12 (06/07 0300) BP: (105-153)/(46-93) 140/62 mmHg (06/07 0954) SpO2:  [95 %-100 %] 99 % (06/07 0804) Weight:  [142 lb 12.8 oz (64.774 kg)-147 lb 0.8 oz (66.7 kg)] 147 lb 0.8 oz (66.7 kg) (06/07 0300) Weight change:  Last BM Date: 03/11/14  CBG (last 3)   Recent Labs  03/10/14 1804 03/10/14 2134 03/11/14 0827  GLUCAP 98 182* 153*    Intake/Output from previous day:  Intake/Output Summary (Last 24 hours) at 03/11/14 1011 Last data filed at 03/11/14 1000  Gross per 24 hour  Intake   1570 ml  Output    570 ml  Net   1000 ml   06/06 0701 - 06/07 0700 In: 985 [I.V.:985] Out: 570 [Urine:570]  Physical Exam General appearance: WF in NAD  Eyes: no scleral icterus Throat: oropharynx moist without erythema Resp: CTAB, wheezes exp  Cardio: ir/ir, reg rate, no MRG  GI: soft, non-tender; bowel sounds normal; no masses,  no organomegaly Extremities: no clubbing, cyanosis or edema   Lab Results:  Recent Labs  03/10/14 1309 03/11/14 0257  NA 136* 137  K 3.7 4.5  CL 99 102  CO2 18* 20  GLUCOSE 94 184*  BUN 26* 24*  CREATININE 0.82 0.84  CALCIUM 9.6 9.2    No results found for this basename: AST, ALT, ALKPHOS, BILITOT, PROT, ALBUMIN,  in the last 72 hours   Recent Labs  03/10/14 1309 03/11/14 0257  WBC 15.4* 11.0*  HGB 15.3* 14.7  HCT 43.4 42.8  MCV 89.1 90.5  PLT 191 165    Lab Results  Component Value Date   INR 2.10* 03/11/2014   INR 1.98* 03/10/2014   INR 2.50* 11/27/2013    No results found for this basename: CKTOTAL, CKMB, CKMBINDEX, TROPONINI,  in the last 72 hours  No results found for this basename: TSH, T4TOTAL, FREET3, T3FREE, THYROIDAB,  in the  last 72 hours  No results found for this basename: VITAMINB12, FOLATE, FERRITIN, TIBC, IRON, RETICCTPCT,  in the last 72 hours  Micro Results: Recent Results (from the past 240 hour(s))  MRSA PCR SCREENING     Status: None   Collection Time    03/10/14  5:54 PM      Result Value Ref Range Status   MRSA by PCR NEGATIVE  NEGATIVE Final   Comment:            The GeneXpert MRSA Assay (FDA     approved for NASAL specimens     only), is one component of a     comprehensive MRSA colonization     surveillance program. It is not     intended to diagnose MRSA     infection nor to guide or     monitor treatment for     MRSA infections.    Studies/Results: Dg Chest Port 1 View  03/10/2014   CLINICAL DATA:  Shortness of breath and chest pain.  EXAM: PORTABLE CHEST - 1 VIEW  COMPARISON:  10/16/2011 and 04/11/2012  FINDINGS: Lungs are adequately inflated as patient is slightly rotated to the right. There is no definite focal consolidation or effusion. Cardiomediastinal silhouette is within normal. There  is calcification over the mitral valve anulus as well as calcification over the aortic arch. Remainder the exam is unchanged.  IMPRESSION: No acute cardiopulmonary disease.   Electronically Signed   By: Marin Olp M.D.   On: 03/10/2014 13:31     Medications: Scheduled: . budesonide-formoterol  2 puff Inhalation BID  . diltiazem  30 mg Oral 4 times per day  . insulin aspart  0-15 Units Subcutaneous TID WC  . insulin aspart  0-5 Units Subcutaneous QHS  . insulin glargine  28 Units Subcutaneous Daily  . ipratropium  0.5 mg Nebulization BID  . latanoprost  1 drop Both Eyes QHS  . levalbuterol  0.63 mg Nebulization BID  . methylphenidate  5 mg Oral 2 times per day  . metoprolol tartrate  25 mg Oral BID  . multivitamin with minerals  1 tablet Oral Daily  . pantoprazole  40 mg Oral Q breakfast  . predniSONE  40 mg Oral Q breakfast  . saccharomyces boulardii  250 mg Oral BID  . sertraline  50  mg Oral QPM  . sodium chloride  3 mL Intravenous Q12H  . vancomycin  125 mg Oral Q3 days  . Warfarin - Pharmacist Dosing Inpatient   Does not apply q1800   Continuous: . sodium chloride 75 mL/hr at 03/11/14 0954  . diltiazem (CARDIZEM) infusion 5 mg/hr (03/10/14 2000)    Assessment/Plan:  COPD exacerbation - resume home alb neg BID w/ symbicort. Cont pred 40 qd. Supplement O2 prn. Improving  Anxiety - may have played into her dyspnea on presentation. Controlled on BID prn BZD  A fib - now controlled on home meds per tele and AM EKG.  Coumadin per pharm .  DM - home lantus dose +SSI  Hx of c diff - cont home dose of probiotic and po vanc HTN/HLD - home meds  PPx - coumadin  Dispo - to floor today, likely home tomorrow if stable      DVT Prophylaxis   LOS: 1 day   [facility] 03/11/2014, 10:11 AM

## 2014-03-11 NOTE — Progress Notes (Addendum)
ANTICOAGULATION CONSULT NOTE - Initial Consult  Pharmacy Consult for warfarin Indication: atrial fibrillation  Allergies  Allergen Reactions  . Lactose Intolerance (Gi) Nausea And Vomiting    severe stomach pain  . Penicillins Hives and Itching    "haven't used any since 1970's"  . Sulfa Antibiotics Hives and Itching    "haven't used any since 1970's"  . Ciprofloxacin Swelling    Patient Measurements: Height: 5\' 2"  (157.5 cm) Weight: 147 lb 0.8 oz (66.7 kg) IBW/kg (Calculated) : 50.1  Vital Signs: Temp: 98.6 F (37 C) (06/07 0804) Temp src: Oral (06/07 0804) BP: 140/62 mmHg (06/07 0954) Pulse Rate: 84 (06/07 0300)  Labs:  Recent Labs  03/10/14 1309 03/11/14 0257 03/11/14 0820  HGB 15.3* 14.7  --   HCT 43.4 42.8  --   PLT 191 165  --   LABPROT 21.9*  --  22.9*  INR 1.98*  --  2.10*  CREATININE 0.82 0.84  --     Estimated Creatinine Clearance: 46.2 ml/min (by C-G formula based on Cr of 0.84).   Medical History: Past Medical History  Diagnosis Date  . Pancreatitis     ~2008, 11/2011 post ERCP  . Hypertension   . Glaucoma   . High cholesterol   . GERD (gastroesophageal reflux disease)     with HH  . Atrial fibrillation     RVR hx.  Chronic Coumadin  . Pneumonia 12/2011    "first time I know about"  . Type II diabetes mellitus   . Depression   . Chronic lower back pain   . COPD (chronic obstructive pulmonary disease)   . HCAP (healthcare-associated pneumonia) 11/21/2011  . Mitral regurgitation 03/11/2012  . Metabolic encephalopathy 03/06/2632  . E-coli UTI 11/13/2011, 11/2013  . SIRS (systemic inflammatory response syndrome) 03/13/2012    a/w post ERCP  pancreatitis.   . C. difficile colitis 09/21/2013, 11/2013  . Carotid stenosis     a. Carotid US (10/2013):  bilat 1-39%; f/u 1 year  . Sepsis 11/27/2013  . UTI (lower urinary tract infection) 11/23/2013    Assessment: 84 YOF admitted with chest pressure, tightness and dyspnea. She has been on coumadin for  afib. INR is therapeutic this AM.   Home dose is 2.5mg  daily except 5mg  on Mondays and Fridays.   Goal of Therapy:  INR 2-3 Monitor platelets by anticoagulation protocol: Yes   Plan:   Cont coumadin 2.5mg  daily except 5mg  MF Daily INR for now

## 2014-03-12 LAB — GLUCOSE, CAPILLARY: Glucose-Capillary: 127 mg/dL — ABNORMAL HIGH (ref 70–99)

## 2014-03-12 LAB — PROTIME-INR
INR: 2.21 — AB (ref 0.00–1.49)
Prothrombin Time: 23.8 seconds — ABNORMAL HIGH (ref 11.6–15.2)

## 2014-03-12 MED ORDER — PREDNISONE 20 MG PO TABS: ORAL_TABLET | ORAL | Status: AC

## 2014-03-12 NOTE — Discharge Summary (Signed)
Physician Discharge Summary  Patient ID: [name] [name] MRN: [id number] DOB/AGE: [birth year] 80 y.o.  Admit date: 03/10/2014 Discharge date: 03/12/2014   Discharge Diagnoses:  Principal Problem:   Shortness of breath Active Problems:   Enteritis due to Clostridium difficile   DM II (diabetes mellitus, type II), controlled   Chronic diarrhea   HTN (hypertension)   Hypercholesteremia   Anxiety   Encounter for long-term (current) use of anticoagulants   Atrial fibrillation with RVR   Discharged Condition: good  Hospital Course: Belkis Norbeck is an 80 y.o. female. Pt w/ hx of COPD and A fib presents w/ chest pressure, tightness and dyspnea that started this AM. The CP is atypical and resolved w/ nitro. Nitro in ED did cause a drop in BP that recovered spontaneously, and Trop and EKG were unremarkable for ACS changes. She was noted to be in RVR w/ HR into the 120s that recovered to 90-100s on dilt ggt, but did require 1 10mg  bolus of dilt. Labs only showed mild leukocytosis. BNP mildly elevated, she is on lasix 3x per week per centricity. CXR unremarkable. She is on po vanc every 3rd day until end of June to complete treatment of c diff. She was admitted due to dyspnea with atrial fibrillation with a rapid ventricular rate.  She was admitted to a telemetry bed and treated with IV cardizem, frequent nebs, and cortisteroids. Her condition improved rapidly and she was transitioned to po meds without problems. On the morning of discharge she had no dyspnea lying supine in bed and felt great. She was not having significant diarrhea.  Consults: None  Significant Diagnostic Studies:  Dg Chest Port 1 View  03/10/2014   CLINICAL DATA:  Shortness of breath and chest pain.  EXAM: PORTABLE CHEST - 1 VIEW  COMPARISON:  10/16/2011 and 04/11/2012  FINDINGS: Lungs are adequately inflated as patient is slightly rotated to the right. There is no definite focal consolidation or effusion. Cardiomediastinal  silhouette is within normal. There is calcification over the mitral valve anulus as well as calcification over the aortic arch. Remainder the exam is unchanged.  IMPRESSION: No acute cardiopulmonary disease.   Electronically Signed   By: Marin Olp M.D.   On: 03/10/2014 13:31    Labs: Lab Results  Component Value Date   WBC 11.0* 03/11/2014   HGB 14.7 03/11/2014   HCT 42.8 03/11/2014   MCV 90.5 03/11/2014   PLT 165 03/11/2014     Recent Labs Lab 03/11/14 0257  NA 137  K 4.5  CL 102  CO2 20  BUN 24*  CREATININE 0.84  CALCIUM 9.2  GLUCOSE 184*       Lab Results  Component Value Date   INR 2.21* 03/12/2014   INR 2.10* 03/11/2014   INR 1.98* 03/10/2014     Recent Results (from the past 240 hour(s))  MRSA PCR SCREENING     Status: None   Collection Time    03/10/14  5:54 PM      Result Value Ref Range Status   MRSA by PCR NEGATIVE  NEGATIVE Final   Comment:            The GeneXpert MRSA Assay (FDA     approved for NASAL specimens     only), is one component of a     comprehensive MRSA colonization     surveillance program. It is not     intended to diagnose MRSA     infection nor to guide or  monitor treatment for     MRSA infections.      Discharge Exam: Blood pressure 113/59, pulse 87, temperature 97.6 F (36.4 C), temperature source Oral, resp. rate 18, height 5\' 2"  (1.575 m), weight 64.411 kg (142 lb), SpO2 95.00%.  Physical Exam: In general, she is an elderly white woman who was in no apparent distress lying supine in bed. HEENT exam was normal, neck was without JVD, lungs were clear, heart had an irregularly irregular rhythm at a ventricular rate about 90, abdomen was benign, extremities were without edema, and she was alert and in good spirits with no focal neurological deficits.  Disposition:  She will be discharged to home in the company of her husband.   Discharge Instructions   Call MD for:    Complete by:  As directed   Call if breathing worsens, or you  develop chest pain, fever, chills, worsening diarrhea, or any other concerning symptoms     Diet - low sodium heart healthy    Complete by:  As directed      Discharge instructions    Complete by:  As directed   Check body weight today, and every day and call our office if weight either increases or decreases by 3 pounds.     Increase activity slowly    Complete by:  As directed             Medication List         albuterol 0.63 MG/3ML nebulizer solution  Commonly known as:  ACCUNEB  Take 1 ampule by nebulization 2 (two) times daily.     ALPRAZolam 1 MG tablet  Commonly known as:  XANAX  Take 0.5 tablets (0.5 mg total) by mouth 4 (four) times daily as needed for anxiety. For sleep or anxiety.     diltiazem 120 MG 24 hr capsule  Commonly known as:  CARDIZEM CD  Take 1 capsule (120 mg total) by mouth daily.     glimepiride 4 MG tablet  Commonly known as:  AMARYL  Take 4 mg by mouth daily with breakfast.     HYDROcodone-acetaminophen 5-500 MG per tablet  Commonly known as:  VICODIN  Take 1 tablet by mouth every 6 (six) hours as needed for pain.     ipratropium 0.02 % nebulizer solution  Commonly known as:  ATROVENT  Take 0.5 mg by nebulization 2 (two) times daily.     LANTUS SOLOSTAR 100 UNIT/ML Solostar Pen  Generic drug:  Insulin Glargine  Inject 28 Units into the skin daily.     latanoprost 0.005 % ophthalmic solution  Commonly known as:  XALATAN  Place 1 drop into both eyes at bedtime.     methylphenidate 5 MG tablet  Commonly known as:  RITALIN  Take 5 mg by mouth 2 (two) times daily.     metoprolol tartrate 25 MG tablet  Commonly known as:  LOPRESSOR  Take 25 mg by mouth 2 (two) times daily.     multivitamin with minerals Tabs tablet  Take 1 tablet by mouth daily.     omeprazole 20 MG capsule  Commonly known as:  PRILOSEC  Take 20 mg by mouth 2 (two) times daily.     predniSONE 20 MG tablet  Commonly known as:  DELTASONE  Take 1 tablet by mouth  daily for 5 days, then 1/2 tab daily for 4 days     saccharomyces boulardii 250 MG capsule  Commonly known as:  FLORASTOR  Take one  po BID x 6 weeks     sertraline 50 MG tablet  Commonly known as:  ZOLOFT  Take 50 mg by mouth every evening.     SYMBICORT 160-4.5 MCG/ACT inhaler  Generic drug:  budesonide-formoterol  Inhale 2 puffs into the lungs 2 (two) times daily.     vancomycin 50 mg/mL oral solution  Commonly known as:  VANCOCIN  Take 2.5 mLs (125 mg total) by mouth 4 (four) times daily. And as directed     warfarin 5 MG tablet  Commonly known as:  COUMADIN  Take 2.5-5 mg by mouth daily. Take 5 mg every Monday and Friday then 2.5 mg the rest of the week     zolpidem 5 MG tablet  Commonly known as:  AMBIEN  Take 5 mg by mouth at bedtime as needed for sleep.           Follow-up Information   Follow up with Donnajean Lopes, MD. Schedule an appointment as soon as possible for a visit in 4 days.   Specialty:  Internal Medicine   Contact information:   [place] [address] [phone]       Signed: [name] 03/12/2014, 8:13 AM

## 2014-03-12 NOTE — Progress Notes (Signed)
Discharge education and medication reviewed with pt and husband, both stated understanding, IV and tele removed, pt getting cleaned up and dressed Rickard Rhymes, RN

## 2014-03-26 ENCOUNTER — Telehealth: Payer: Self-pay | Admitting: Internal Medicine

## 2014-03-26 NOTE — Telephone Encounter (Signed)
Dr. Luberta [facility] [phone]

## 2014-03-26 NOTE — Telephone Encounter (Signed)
Patient is scheduled for a surgery to repair an eyelid.  She needs to be c-diff negative for the surgeon to proceed. Her husband reports stools are improved and she nor he feel she has c-diff.  She is now on q 3 day vanc taper.  Should we do a stool for c-diff by PCR or c-diff toxin??????

## 2014-03-26 NOTE — Telephone Encounter (Signed)
Who is the surgeon? I may need to talk to them

## 2014-04-02 NOTE — Telephone Encounter (Signed)
Having cataract surgery 7/16 and 7/23  At [address] [place]  Have her do a C diff PCR please

## 2014-04-03 NOTE — Telephone Encounter (Signed)
Left message for pt to call back  °

## 2014-04-05 ENCOUNTER — Other Ambulatory Visit: Payer: Self-pay

## 2014-04-05 ENCOUNTER — Other Ambulatory Visit: Payer: Medicare Other

## 2014-04-05 DIAGNOSIS — Z8619 Personal history of other infectious and parasitic diseases: Secondary | ICD-10-CM

## 2014-04-05 NOTE — Telephone Encounter (Signed)
Spoke with patient's husband. He states they have the kit and plan to turn it in on 04/09/14.

## 2014-04-09 ENCOUNTER — Other Ambulatory Visit: Payer: Medicare Other

## 2014-04-09 DIAGNOSIS — I10 Essential (primary) hypertension: Secondary | ICD-10-CM

## 2014-04-09 DIAGNOSIS — Z8619 Personal history of other infectious and parasitic diseases: Secondary | ICD-10-CM

## 2014-04-09 DIAGNOSIS — E7800 Pure hypercholesterolemia, unspecified: Secondary | ICD-10-CM

## 2014-04-10 ENCOUNTER — Telehealth: Payer: Self-pay | Admitting: Internal Medicine

## 2014-04-10 LAB — CLOSTRIDIUM DIFFICILE BY PCR: Toxigenic C. Difficile by PCR: DETECTED — CR

## 2014-04-10 NOTE — Telephone Encounter (Signed)
I can see her tomorrow PM after my first office patient at 3 PM

## 2014-04-10 NOTE — Telephone Encounter (Signed)
Patient is having nausea.  Worse in the am as soon as she wakes up but still present in the pm.  Her stools are loose, but not consistent with her last episodes of c-diff.  Her husband brought up the specimen for c-diff.  Husband reports that she is very lethargic since last Thursday. Her husband has been in contact with her primary care and they have made some medication adjustments with no improvements.  He contacted her primary care again today and they suggested they call here.  Please advise next step.

## 2014-04-10 NOTE — Telephone Encounter (Signed)
Patient is scheduled for tomorrow at 3:15.  I spoke with her husband and he is aware

## 2014-04-11 ENCOUNTER — Ambulatory Visit (INDEPENDENT_AMBULATORY_CARE_PROVIDER_SITE_OTHER): Payer: Medicare Other | Admitting: Internal Medicine

## 2014-04-11 ENCOUNTER — Encounter: Payer: Self-pay | Admitting: Internal Medicine

## 2014-04-11 VITALS — BP 120/74 | HR 80 | Ht 60.0 in | Wt 146.0 lb

## 2014-04-11 DIAGNOSIS — A0472 Enterocolitis due to Clostridium difficile, not specified as recurrent: Secondary | ICD-10-CM

## 2014-04-11 DIAGNOSIS — K589 Irritable bowel syndrome without diarrhea: Secondary | ICD-10-CM

## 2014-04-11 DIAGNOSIS — R110 Nausea: Secondary | ICD-10-CM | POA: Insufficient documentation

## 2014-04-11 MED ORDER — ONDANSETRON HCL 4 MG PO TABS: 4.0000 mg | ORAL_TABLET | Freq: Three times a day (TID) | ORAL | Status: DC | PRN

## 2014-04-11 MED ORDER — SUCRALFATE 1 G PO TABS: 1.0000 g | ORAL_TABLET | Freq: Three times a day (TID) | ORAL | Status: DC

## 2014-04-11 NOTE — Progress Notes (Signed)
Subjective:    Patient ID: [name] [name], female    DOB: 19-Apr-1930, 80 y.o.   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 She had a retest of C diff PCR (+)2 d ago as part of evaluation before possible eye surgery to repair film after cataract surgery.   Eating seems to make nausea worse and can easily induce heartburn. Has some relief with Mylanta.   She says she ia at baseline with 1-2 loose stools a day in AM - which has been a chronic pattern and does not think she is having C diff problems now.  Sleeping ok.  Medications, allergies, past medical history, past surgical history, family history and social history are reviewed and updated in the EMR.   Review of Systems As above    Objective:   Physical Exam General:  NAD but elderly and mildly asthenic Eyes:   anicteric Lungs:  clear Heart:  S1S2 no rubs, murmurs or gallops Abdomen:  Obese, soft and nontender, BS+  Data Reviewed:  Recent hospital notes labs     Assessment & Plan:  C. difficile colitis + PCR but clinically does not have dz - is at baseline re: diarrhea/loose stools Stay on Florastor RTC 6 weeks  Nausea alone ? GERD ? Functional dyspepsia Do not think related to 2 short-term prednisone Rxs  Carafate 1 g qid - do not increase PPI given C diff issues Ondansetron prn  RTC 6 weeks  Irritable bowel syndrome Loperamide prn I think this is where diarrhea comes from at this time    Current outpatient prescriptions:diltiazem (CARDIZEM CD) 120 MG 24 hr capsule, Take 1 capsule (120 mg total) by mouth daily., Disp: 90 capsule, Rfl: 3;  glimepiride (AMARYL) 4 MG tablet, Take 4 mg by mouth daily with breakfast. , Disp: , Rfl: ;  HYDROcodone-acetaminophen (VICODIN) 5-500 MG per tablet, Take 1 tablet by mouth every 6 (six) hours as needed for pain., Disp: , Rfl:  LANTUS SOLOSTAR 100 UNIT/ML Solostar Pen, Inject 28  Units into the skin daily. , Disp: , Rfl: ;  latanoprost (XALATAN) 0.005 % ophthalmic solution, Place 1 drop into both eyes at bedtime., Disp: , Rfl: ;  LORazepam (ATIVAN) 1 MG tablet, Take 1 mg by mouth 4 (four) times daily - after meals and at bedtime. , Disp: , Rfl: ;  methylphenidate (RITALIN) 5 MG tablet, Take 5 mg by mouth 2 (two) times daily., Disp: , Rfl:  metoprolol tartrate (LOPRESSOR) 25 MG tablet, Take 25 mg by mouth 2 (two) times daily., Disp: , Rfl: ;  Multiple Vitamin (MULTIVITAMIN WITH MINERALS) TABS, Take 1 tablet by mouth daily., Disp: , Rfl: ;  NON FORMULARY, ipratrobium bromide 0.5mg  and ploerterol sulfate 3mg    Combined for use twice daily in neubulizer., Disp: , Rfl: ;  omeprazole (PRILOSEC) 20 MG capsule, Take 20 mg by mouth 2 (two) times daily. , Disp: , Rfl:  saccharomyces boulardii (FLORASTOR) 250 MG capsule, Take one po BID x 6 weeks, Disp: 60 capsule, Rfl: 1;  sertraline (ZOLOFT) 25 MG tablet, Take 25 mg by mouth daily., Disp: , Rfl: ;  SYMBICORT 160-4.5 MCG/ACT inhaler, Inhale 2 puffs into the lungs 2 (two) times daily. , Disp: , Rfl: ;  warfarin (COUMADIN) 5 MG tablet, Take 2.5-5 mg by mouth daily. Take 5 mg every Monday and Friday then 2.5 mg the rest of the week, Disp: , Rfl:  zolpidem (  AMBIEN) 5 MG tablet, Take 5 mg by mouth at bedtime as needed for sleep., Disp: , Rfl: ;  ondansetron (ZOFRAN) 4 MG tablet, Take 1 tablet (4 mg total) by mouth every 8 (eight) hours as needed for nausea or vomiting., Disp: 30 tablet, Rfl: 1;  sucralfate (CARAFATE) 1 G tablet, Take 1 tablet (1 g total) by mouth 4 (four) times daily -  with meals and at bedtime. May dissolve in 10 cc water if needed, Disp: 120 tablet, Rfl: 1    I appreciate the opportunity to care for this patient.  XO:[id number],MAYOKH G, MD

## 2014-04-11 NOTE — Assessment & Plan Note (Signed)
Loperamide prn I think this is where diarrhea comes from at this time

## 2014-04-11 NOTE — Assessment & Plan Note (Addendum)
?   GERD ? Functional dyspepsia Do not think related to 2 short-term prednisone Rxs  Carafate 1 g qid - do not increase PPI given C diff issues Ondansetron prn  RTC 6 weeks

## 2014-04-11 NOTE — Patient Instructions (Addendum)
We have sent the following medications to your pharmacy for you to pick up at your convenience: Generic zofran, carafate  Follow up with [place] in 6 weeks, appointment made for 05/21/14 at 3:15pm.   I appreciate the opportunity to care for you.

## 2014-04-11 NOTE — Assessment & Plan Note (Signed)
+   PCR but clinically does not have dz - is at baseline re: diarrhea/loose stools Stay on Florastor RTC 6 weeks

## 2014-04-12 ENCOUNTER — Telehealth: Payer: Self-pay | Admitting: *Deleted

## 2014-04-12 ENCOUNTER — Telehealth: Payer: Self-pay

## 2014-04-12 NOTE — Telephone Encounter (Signed)
VIA FAX FROM [facility] PRIOR AUTH NEEDED FOR PATIENTS ZOFRAN. WENT TO COVER MY MEDS ONLINE AND FILLED OUT PRIOR AUTH FORM. PRINTED OUT FORM FOR [facility] [employer]DS AND FAXED COPY OVER TO COVER MY MEDS. AUTHORIZATION WILL TAKE 24-72 HRS.

## 2014-04-12 NOTE — Telephone Encounter (Signed)
Neoma Laming called from [facility] [employer] and questions were answered regarding the prior authorization for generic zofran and she approved it over the phone for a year.  I notified [facility] of the approval.

## 2014-04-16 ENCOUNTER — Telehealth: Payer: Self-pay | Admitting: Internal Medicine

## 2014-04-16 NOTE — Telephone Encounter (Signed)
Patient having intermittent abdominal pain.  She reports that the pain has been there about a week.  She is asked to continue her carafate, prilosec, and zofran as needed for nausea.  She is given an appt for 04/24/14 with Dr. Carlean Purl.  She will call back for any worsening symptoms.

## 2014-04-24 ENCOUNTER — Ambulatory Visit (INDEPENDENT_AMBULATORY_CARE_PROVIDER_SITE_OTHER): Payer: Medicare Other | Admitting: Internal Medicine

## 2014-04-24 ENCOUNTER — Encounter: Payer: Self-pay | Admitting: Internal Medicine

## 2014-04-24 ENCOUNTER — Other Ambulatory Visit (INDEPENDENT_AMBULATORY_CARE_PROVIDER_SITE_OTHER): Payer: Medicare Other

## 2014-04-24 VITALS — BP 122/70 | HR 104 | Ht 60.0 in | Wt 145.2 lb

## 2014-04-24 DIAGNOSIS — R1031 Right lower quadrant pain: Secondary | ICD-10-CM

## 2014-04-24 DIAGNOSIS — R5381 Other malaise: Secondary | ICD-10-CM

## 2014-04-24 DIAGNOSIS — R110 Nausea: Secondary | ICD-10-CM

## 2014-04-24 DIAGNOSIS — R5383 Other fatigue: Secondary | ICD-10-CM

## 2014-04-24 DIAGNOSIS — A0472 Enterocolitis due to Clostridium difficile, not specified as recurrent: Secondary | ICD-10-CM

## 2014-04-24 LAB — BASIC METABOLIC PANEL
BUN: 12 mg/dL (ref 6–23)
CO2: 21 mEq/L (ref 19–32)
Calcium: 9.9 mg/dL (ref 8.4–10.5)
Chloride: 106 mEq/L (ref 96–112)
Creatinine, Ser: 0.8 mg/dL (ref 0.4–1.2)
GFR: 69.93 mL/min (ref >60.00)
Glucose, Bld: 116 mg/dL — ABNORMAL HIGH (ref 70–99)
Potassium: 3.7 mEq/L (ref 3.5–5.1)
Sodium: 138 mEq/L (ref 135–145)

## 2014-04-24 NOTE — Progress Notes (Signed)
   Subjective:    Patient ID: [name] [name], female    DOB: August 13, 1930, 80 y.o.   MRN: [id number]  HPI Since I last saw her she became lethargic and weak - still with one loose stool daily. Saw Dr. Philip Aspen last week and had visit, vancomycin 125 mg tid Rxed - less lethargic but no change in pain, stools No fever C/o nausea, especially when arising from seated position, gets sweaty also ++ anorexia Medications, allergies, past medical history, past surgical history, family history and social history are reviewed and updated in the EMR.  Review of Systems As above    Objective:   Physical Exam  General:  NAD Eyes:   anicteric Lungs:  clear Heart:  S1S2 no rubs, murmurs or gallops Abdomen:  soft and nontender, BS+ Ext:   no edema Psych:  Appropriate mood and affect      Assessment & Plan:  C. difficile colitis Does not seem symptomatic  Nausea alone Probably multifactorial, ? Meds, illness, anxiety  RLQ abdominal pain CT to evalualte ? C diff, IBS, other   NO:[id number],BSJGGE G, MD

## 2014-04-24 NOTE — Assessment & Plan Note (Signed)
Does not seem symptomatic

## 2014-04-24 NOTE — Patient Instructions (Signed)
Today we have drawn lab work.  You have been scheduled for a CT scan of the abdomen and pelvis at [facility] ([address]---this is in the same building as Press [facility]).   You are scheduled on 04/26/14 at 3:00pm. You should arrive 15 minutes prior to your appointment time for registration. Please follow the written instructions below on the day of your exam:  WARNING: IF YOU ARE ALLERGIC TO IODINE/X-RAY DYE, PLEASE NOTIFY RADIOLOGY IMMEDIATELY AT [phone]! YOU WILL BE GIVEN A 13 HOUR PREMEDICATION PREP.  1) Do not eat or drink anything after 11:00am (4 hours prior to your test) 2) You have been given 2 bottles of oral contrast to drink. The solution may taste  better if refrigerated, but do NOT add ice or any other liquid to this solution. Shake well before drinking.    Drink 1 bottle of contrast @ 1:00pm (2 hours prior to your exam)  Drink 1 bottle of contrast @ 2:00pm (1 hour prior to your exam)  You may take any medications as prescribed with a small amount of water except for the following: Metformin, Glucophage, Glucovance, Avandamet, Riomet, Fortamet, Actoplus Met, Janumet, Glumetza or Metaglip. The above medications must be held the day of the exam AND 48 hours after the exam.  The purpose of you drinking the oral contrast is to aid in the visualization of your intestinal tract. The contrast solution may cause some diarrhea. Before your exam is started, you will be given a small amount of fluid to drink. Depending on your individual set of symptoms, you may also receive an intravenous injection of x-ray contrast/dye. Plan on being at [facility] for 30 minutes or long, depending on the type of exam you are having performed.  This test typically takes 30-45 minutes to complete.  If you have any questions regarding your exam or if you need to reschedule, you may call the CT department at [phone] between the hours of 8:00 am and 5:00 pm,  Monday-Friday.  ________________________________________________________________________  I appreciate the opportunity to care for you.

## 2014-04-25 NOTE — Assessment & Plan Note (Signed)
Probably multifactorial, ? Meds, illness, anxiety

## 2014-04-25 NOTE — Assessment & Plan Note (Signed)
CT to evalualte ? C diff, IBS, other

## 2014-04-26 ENCOUNTER — Ambulatory Visit (INDEPENDENT_AMBULATORY_CARE_PROVIDER_SITE_OTHER)
Admission: RE | Admit: 2014-04-26 | Discharge: 2014-04-26 | Disposition: A | Discharge status: Home / Self Care | Payer: Medicare Other | Source: Ambulatory Visit | Attending: Internal Medicine | Admitting: Internal Medicine

## 2014-04-26 DIAGNOSIS — R1031 Right lower quadrant pain: Secondary | ICD-10-CM

## 2014-04-26 DIAGNOSIS — A0472 Enterocolitis due to Clostridium difficile, not specified as recurrent: Secondary | ICD-10-CM

## 2014-04-26 MED ORDER — IOHEXOL 300 MG/ML  SOLN
100.0000 mL | Freq: Once | INTRAMUSCULAR | Status: AC | PRN
  Administered 2014-04-26: 100 mL via INTRAVENOUS

## 2014-04-27 ENCOUNTER — Telehealth: Payer: Self-pay | Admitting: Internal Medicine

## 2014-04-27 NOTE — Telephone Encounter (Signed)
Patient's husband notified that will call when Dr. Carlean Purl has had a chance to revuew CT.  He understands that it maybe next week.

## 2014-04-30 NOTE — Progress Notes (Signed)
Quick Note:  The anal and rectal area was abnormal on the CT so we should have her do a flex sig Please arrange for Wed at hospital Enema prep ok (nothing oral)    ______

## 2014-05-01 ENCOUNTER — Other Ambulatory Visit: Payer: Self-pay

## 2014-05-01 DIAGNOSIS — R933 Abnormal findings on diagnostic imaging of other parts of digestive tract: Secondary | ICD-10-CM

## 2014-05-02 ENCOUNTER — Encounter (HOSPITAL_COMMUNITY): Payer: Self-pay | Admitting: *Deleted

## 2014-05-02 ENCOUNTER — Encounter (HOSPITAL_COMMUNITY)
Admission: RE | Disposition: A | Discharge status: Home / Self Care | Payer: Self-pay | Source: Ambulatory Visit | Attending: Internal Medicine

## 2014-05-02 ENCOUNTER — Telehealth: Payer: Self-pay

## 2014-05-02 ENCOUNTER — Ambulatory Visit (HOSPITAL_COMMUNITY)
Admission: RE | Admit: 2014-05-02 | Discharge: 2014-05-02 | Disposition: A | Discharge status: Home / Self Care | Payer: Medicare Other | Source: Ambulatory Visit | Attending: Internal Medicine | Admitting: Internal Medicine

## 2014-05-02 ENCOUNTER — Encounter: Payer: Self-pay | Admitting: Internal Medicine

## 2014-05-02 ENCOUNTER — Telehealth: Payer: Self-pay | Admitting: Internal Medicine

## 2014-05-02 DIAGNOSIS — R1031 Right lower quadrant pain: Secondary | ICD-10-CM | POA: Diagnosis present

## 2014-05-02 DIAGNOSIS — R933 Abnormal findings on diagnostic imaging of other parts of digestive tract: Secondary | ICD-10-CM

## 2014-05-02 DIAGNOSIS — K5730 Diverticulosis of large intestine without perforation or abscess without bleeding: Secondary | ICD-10-CM | POA: Diagnosis not present

## 2014-05-02 HISTORY — PX: FLEXIBLE SIGMOIDOSCOPY: SHX5431

## 2014-05-02 LAB — GLUCOSE, CAPILLARY: Glucose-Capillary: 129 mg/dL — ABNORMAL HIGH (ref 70–99)

## 2014-05-02 SURGERY — SIGMOIDOSCOPY, FLEXIBLE
Anesthesia: Moderate Sedation

## 2014-05-02 MED ORDER — SODIUM CHLORIDE 0.9 % IV SOLN
INTRAVENOUS | Status: DC
  Administered 2014-05-02: 500 mL via INTRAVENOUS

## 2014-05-02 MED ORDER — FENTANYL CITRATE 0.05 MG/ML IJ SOLN
INTRAMUSCULAR | Status: AC
  Filled 2014-05-02: qty 4

## 2014-05-02 MED ORDER — MIDAZOLAM HCL 10 MG/2ML IJ SOLN
INTRAMUSCULAR | Status: DC | PRN
  Administered 2014-05-02 (×3): 2 mg via INTRAVENOUS

## 2014-05-02 MED ORDER — DIPHENHYDRAMINE HCL 50 MG/ML IJ SOLN
INTRAMUSCULAR | Status: AC
  Filled 2014-05-02: qty 1

## 2014-05-02 MED ORDER — FENTANYL CITRATE 0.05 MG/ML IJ SOLN
INTRAMUSCULAR | Status: DC | PRN
  Administered 2014-05-02 (×3): 25 ug via INTRAVENOUS

## 2014-05-02 MED ORDER — DICYCLOMINE HCL 20 MG PO TABS: 10.0000 mg | ORAL_TABLET | Freq: Four times a day (QID) | ORAL | Status: DC | PRN

## 2014-05-02 MED ORDER — MIDAZOLAM HCL 10 MG/2ML IJ SOLN
INTRAMUSCULAR | Status: AC
  Filled 2014-05-02: qty 4

## 2014-05-02 NOTE — Telephone Encounter (Signed)
Patient's husband notified of recommendations.

## 2014-05-02 NOTE — Op Note (Signed)
[facility] [place], [address]   FLEXIBLE SIGMOIDOSCOPY PROCEDURE REPORT  PATIENT: [name] [name], [name] [name]  MR#: [id number] BIRTHDATE: May 17, 1930 , 84  yrs. old GENDER: Female ENDOSCOPIST: Gatha Mayer, MD, [facility] PROCEDURE DATE:  05/02/2014 PROCEDURE:   Sigmoidoscopy, diagnostic ASA CLASS:   Class III INDICATIONS:an abnormal CT.   abdominal pain in the lower right quadrant. MEDICATIONS: Fentanyl 75 mcg IV and Versed 6 mg IV  DESCRIPTION OF PROCEDURE:   After the risks benefits and alternatives of the procedure were thoroughly explained, informed consent was obtained.  revealed no abnormalities of the rectum. The endoscope was introduced through the anus  and advanced to the sigmoid colon , limited by No adverse events experienced.   The quality of the prep was adequate .  The instrument was then slowly withdrawn as the mucosa was fully examined.        COLON FINDINGS: Moderate diverticulosis was noted in the sigmoid colon. The colon mucosa was otherwise normal. Retroflexed views revealed no abnormalities.    The scope was then withdrawn from the patient and the procedure terminated.  COMPLICATIONS: There were no complications.  ENDOSCOPIC IMPRESSION: Moderate diverticulosis was noted in the sigmoid colon  RECOMMENDATIONS: Dicyclomine 20 mg 1/2 tab every 6 hrs as needed for pain/IBS f/u PCP, ? needs adjustment of methylphenidate or other similar medications for malaise, fatuigue sxs no further GI testing now See me in 2 months    eSigned:  Gatha Mayer, MD, [facility] 05/02/2014 8:46 AM   QI:[id number] Philip Aspen, MD The Patient

## 2014-05-02 NOTE — Discharge Instructions (Signed)
No major problems seen here. I saw your diverticulosis but no colitis.  I think your Irritable Bowel Syndrome is bothering you. That is where the intestine spasms and hurts.  I have prescribed dicyclomine to take for pains. It can also help loose bowels.  No further testing at this time.  Please also follow up with Dr. Philip Aspen for your other health needs.  I appreciate the opportunity to care for you. Gatha Mayer, MD, [facility]

## 2014-05-02 NOTE — H&P (View-Only) (Signed)
   Subjective:    Patient ID: [name] [name], female    DOB: 1930/07/17, 80 y.o.   MRN: [id number]  HPI Since I last saw her she became lethargic and weak - still with one loose stool daily. Saw Dr. Philip Aspen last week and had visit, vancomycin 125 mg tid Rxed - less lethargic but no change in pain, stools No fever C/o nausea, especially when arising from seated position, gets sweaty also ++ anorexia Medications, allergies, past medical history, past surgical history, family history and social history are reviewed and updated in the EMR.  Review of Systems As above    Objective:   Physical Exam  General:  NAD Eyes:   anicteric Lungs:  clear Heart:  S1S2 no rubs, murmurs or gallops Abdomen:  soft and nontender, BS+ Ext:   no edema Psych:  Appropriate mood and affect      Assessment & Plan:  C. difficile colitis Does not seem symptomatic  Nausea alone Probably multifactorial, ? Meds, illness, anxiety  RLQ abdominal pain CT to evalualte ? C diff, IBS, other   ZH:[id number] G, MD

## 2014-05-02 NOTE — Interval H&P Note (Signed)
History and Physical Interval Note:  05/02/2014 8:18 AM  [name] [name]  has presented today for surgery, with the diagnosis of abnormal CT of the abdomen  The various methods of treatment have been discussed with the patient and family. After consideration of risks, benefits and other options for treatment, the patient has consented to  Procedure(s): FLEXIBLE SIGMOIDOSCOPY (N/A) as a surgical intervention .  The patient's history has been reviewed, patient examined, no change in status, stable for surgery.  I have reviewed the patient's chart and labs.  Questions were answered to the patient's satisfaction.     Silvano Rusk

## 2014-05-02 NOTE — Telephone Encounter (Signed)
Patient's husband thinks you might have told him to stop her carafate today.  Please clarify if she should remain on it or stop? He asked that I leave a message with the recommendations if he does not answer

## 2014-05-02 NOTE — Telephone Encounter (Signed)
She can stop carafate

## 2014-05-03 ENCOUNTER — Ambulatory Visit: Payer: Medicare Other | Admitting: Internal Medicine

## 2014-05-03 ENCOUNTER — Encounter: Payer: Self-pay | Admitting: Gastroenterology

## 2014-05-03 ENCOUNTER — Other Ambulatory Visit: Payer: Self-pay

## 2014-05-03 ENCOUNTER — Encounter (HOSPITAL_COMMUNITY): Payer: Self-pay | Admitting: Internal Medicine

## 2014-05-03 MED ORDER — METOPROLOL TARTRATE 25 MG PO TABS: 25.0000 mg | ORAL_TABLET | Freq: Two times a day (BID) | ORAL | Status: DC

## 2014-05-03 MED ORDER — DILTIAZEM HCL ER COATED BEADS 120 MG PO CP24: 120.0000 mg | ORAL_CAPSULE | Freq: Every day | ORAL | Status: DC

## 2014-05-03 NOTE — Telephone Encounter (Signed)
Two month.  She should get in to see an APP in that time.

## 2014-05-18 ENCOUNTER — Ambulatory Visit (INDEPENDENT_AMBULATORY_CARE_PROVIDER_SITE_OTHER): Payer: Medicare Other | Admitting: Podiatry

## 2014-05-18 ENCOUNTER — Encounter: Payer: Self-pay | Admitting: Podiatry

## 2014-05-18 VITALS — BP 146/82 | HR 87 | Resp 18

## 2014-05-18 DIAGNOSIS — M79609 Pain in unspecified limb: Secondary | ICD-10-CM

## 2014-05-18 DIAGNOSIS — M79676 Pain in unspecified toe(s): Secondary | ICD-10-CM

## 2014-05-18 DIAGNOSIS — B351 Tinea unguium: Secondary | ICD-10-CM

## 2014-05-18 NOTE — Patient Instructions (Signed)
Diabetes and Foot Care Diabetes may cause you to have problems because of poor blood supply (circulation) to your feet and legs. This may cause the skin on your feet to become thinner, break easier, and heal more slowly. Your skin may become dry, and the skin may peel and crack. You may also have nerve damage in your legs and feet causing decreased feeling in them. You may not notice minor injuries to your feet that could lead to infections or more serious problems. Taking care of your feet is one of the most important things you can do for yourself.  HOME CARE INSTRUCTIONS  Wear shoes at all times, even in the house. Do not go barefoot. Bare feet are easily injured.  Check your feet daily for blisters, cuts, and redness. If you cannot see the bottom of your feet, use a mirror or ask someone for help.  Wash your feet with warm water (do not use hot water) and mild soap. Then pat your feet and the areas between your toes until they are completely dry. Do not soak your feet as this can dry your skin.  Apply a moisturizing lotion or petroleum jelly (that does not contain alcohol and is unscented) to the skin on your feet and to dry, brittle toenails. Do not apply lotion between your toes.  Trim your toenails straight across. Do not dig under them or around the cuticle. File the edges of your nails with an emery board or nail file.  Do not cut corns or calluses or try to remove them with medicine.  Wear clean socks or stockings every day. Make sure they are not too tight. Do not wear knee-high stockings since they may decrease blood flow to your legs.  Wear shoes that fit properly and have enough cushioning. To break in new shoes, wear them for just a few hours a day. This prevents you from injuring your feet. Always look in your shoes before you put them on to be sure there are no objects inside.  Do not cross your legs. This may decrease the blood flow to your feet.  If you find a minor scrape,  cut, or break in the skin on your feet, keep it and the skin around it clean and dry. These areas may be cleansed with mild soap and water. Do not cleanse the area with peroxide, alcohol, or iodine.  When you remove an adhesive bandage, be sure not to damage the skin around it.  If you have a wound, look at it several times a day to make sure it is healing.  Do not use heating pads or hot water bottles. They may burn your skin. If you have lost feeling in your feet or legs, you may not know it is happening until it is too late.  Make sure your health care provider performs a complete foot exam at least annually or more often if you have foot problems. Report any cuts, sores, or bruises to your health care provider immediately. SEEK MEDICAL CARE IF:   You have an injury that is not healing.  You have cuts or breaks in the skin.  You have an ingrown nail.  You notice redness on your legs or feet.  You feel burning or tingling in your legs or feet.  You have pain or cramps in your legs and feet.  Your legs or feet are numb.  Your feet always feel cold. SEEK IMMEDIATE MEDICAL CARE IF:   There is increasing redness,   swelling, or pain in or around a wound.  There is a red line that goes up your leg.  Pus is coming from a wound.  You develop a fever or as directed by your health care provider.  You notice a bad smell coming from an ulcer or wound. Document Released: 09/18/2000 Document Revised: 05/24/2013 Document Reviewed: 02/28/2013 Exit[employer] Patient Information 2015 [employer]. This information is not intended to replace advice given to you by your health care provider. Make sure you discuss any questions you have with your health care provider.  

## 2014-05-18 NOTE — Progress Notes (Signed)
° °  Subjective:    Patient ID: [name] [name], female    DOB: 1930-08-18, 80 y.o.   MRN: [id number]  HPI  Patient presents the office with painful toenails. Patient presents with her husband who states that her blood sugars have been under control. Patient denies any acute changes in his last appointment. Last A1c was 7.1 on 03/11/2014. Denies any recent ulceration   Review of Systems  Skin:       Painful toenails  All other systems reviewed and are negative.      Objective:   Physical Exam  AAOx3, NAD Decreased pedal pulses b/l. Decreased protective sensation with a Semmes Weinstein monofilament.  Atrophic skin b/l. Painful, elongated, hypertorphic, brittle, yellow discoloration to the nails x10. No open lesion. ROM WNL b/l, decreased medial arch height b/l.       Assessment & Plan:  80 year old female with history of diabetes and neuropathy presents with painful elongated nails -Nail sharply debrided N98 without complication -Importance of daily foot inspection and proper foot care discussed the patient in great detail. -Followup in 3 months or sooner if any problems are to occur

## 2014-05-21 ENCOUNTER — Encounter: Payer: Self-pay | Admitting: Internal Medicine

## 2014-05-21 ENCOUNTER — Ambulatory Visit (INDEPENDENT_AMBULATORY_CARE_PROVIDER_SITE_OTHER): Payer: Medicare Other | Admitting: Internal Medicine

## 2014-05-21 VITALS — BP 110/74 | HR 60 | Ht 60.0 in | Wt 144.8 lb

## 2014-05-21 DIAGNOSIS — K529 Noninfective gastroenteritis and colitis, unspecified: Secondary | ICD-10-CM

## 2014-05-21 DIAGNOSIS — K589 Irritable bowel syndrome without diarrhea: Secondary | ICD-10-CM

## 2014-05-21 DIAGNOSIS — R110 Nausea: Secondary | ICD-10-CM

## 2014-05-21 DIAGNOSIS — A0472 Enterocolitis due to Clostridium difficile, not specified as recurrent: Secondary | ICD-10-CM

## 2014-05-21 DIAGNOSIS — R197 Diarrhea, unspecified: Secondary | ICD-10-CM

## 2014-05-21 NOTE — Progress Notes (Signed)
   Subjective:    Patient ID: [name] [name], female    DOB: 30-Oct-1929, 80 y.o.   MRN: [id number]  HPI The patient is here with her husband and she is feeling better. Still with some loose stools but this is really chronic. Energy level not where she wanted but she is better. She had one of her cataract scars treated and the other is scheduled. She has some occasional nausea after she uses a nebulizer which responds to antacid. Medications, allergies, past medical history, past surgical history, family history and social history are reviewed and updated in the EMR.  Review of Systems As per history of present illness    Objective:   Physical Exam Elderly, no acute distress appears more vibrant today    Assessment & Plan:  C. difficile colitis Clinically resolved   Chronic diarrhea Improved/stable  Irritable bowel syndrome Stable   Nausea alone Prn antacid - seems related to nebulizer   CC: Donnajean Lopes, MD

## 2014-05-21 NOTE — Patient Instructions (Signed)
Glad you are feeling better.    Follow up with [place] as needed.    I appreciate the opportunity to care for you.

## 2014-05-21 NOTE — Assessment & Plan Note (Signed)
Clinically resolved.  

## 2014-05-21 NOTE — Assessment & Plan Note (Signed)
Prn antacid - seems related to nebulizer

## 2014-05-21 NOTE — Assessment & Plan Note (Signed)
Stable

## 2014-05-21 NOTE — Assessment & Plan Note (Signed)
Improved/stable.

## 2014-06-13 ENCOUNTER — Encounter: Payer: Self-pay | Admitting: Cardiology

## 2014-06-13 ENCOUNTER — Ambulatory Visit (INDEPENDENT_AMBULATORY_CARE_PROVIDER_SITE_OTHER): Payer: Medicare Other | Admitting: Cardiology

## 2014-06-13 VITALS — BP 124/76 | HR 92 | Ht 60.0 in | Wt 147.8 lb

## 2014-06-13 DIAGNOSIS — I340 Nonrheumatic mitral (valve) insufficiency: Secondary | ICD-10-CM

## 2014-06-13 DIAGNOSIS — I4891 Unspecified atrial fibrillation: Secondary | ICD-10-CM

## 2014-06-13 DIAGNOSIS — I059 Rheumatic mitral valve disease, unspecified: Secondary | ICD-10-CM

## 2014-06-13 NOTE — Patient Instructions (Signed)
Your physician recommends that you schedule a follow-up appointment in:  One year with Dr. Percival Spanish   Dr. Percival Spanish has suggested that you go on  ( Eliquis )  but understands your current circumstance involving insurance; Keep this medication in mind  As an option for the future

## 2014-06-13 NOTE — Progress Notes (Signed)
HPI The patient presents for evaluation of atrial fibrillation.  Since I last saw her she was seen by Richardson Dopp earlier this year.  She was also hospitalized briefly for some shortness of breath. Also she battled C. difficile colitis and has had this treated. It has been a couple of years since I last saw her. She doesn't have any acute cardiovascular complaints. He always has a flat affect and some chronic fatigue. Weakness has been a predominant complaint. However, there is no acute shortness of breath, PND or orthopnea. There are no palpitations she notices that she's had syncope. She denies any chest pressure, neck or arm discomfort. She tolerates anticoagulation with an overt bleeding or bruising.   Allergies  Allergen Reactions  . Lactose Intolerance (Gi) Nausea And Vomiting    severe stomach pain  . Penicillins Hives and Itching    "haven't used any since 1970's"  . Sulfa Antibiotics Hives and Itching    "haven't used any since 1970's"  . Ciprofloxacin Swelling    Current Outpatient Prescriptions  Medication Sig Dispense Refill  . alum & mag hydroxide-simeth (MAALOX PLUS) 400-400-40 MG/5ML suspension Take 5 mLs by mouth every 6 (six) hours as needed for indigestion.      . dicyclomine (BENTYL) 20 MG tablet Take 0.5 tablets (10 mg total) by mouth every 6 (six) hours as needed for spasms (abdominal pain).  90 tablet  0  . diltiazem (CARDIZEM CD) 120 MG 24 hr capsule Take 1 capsule (120 mg total) by mouth daily.  60 capsule  0  . glimepiride (AMARYL) 4 MG tablet Take 4 mg by mouth daily with breakfast.       . HYDROcodone-acetaminophen (VICODIN) 5-500 MG per tablet Take 1 tablet by mouth every 6 (six) hours as needed for pain.      Marland Kitchen ipratropium-albuterol (DUONEB) 0.5-2.5 (3) MG/3ML SOLN Take 3 mLs by nebulization 2 (two) times daily.      Marland Kitchen LANTUS SOLOSTAR 100 UNIT/ML Solostar Pen Inject 28 Units into the skin daily.       Marland Kitchen latanoprost (XALATAN) 0.005 % ophthalmic solution  Place 1 drop into both eyes at bedtime.      Marland Kitchen LORazepam (ATIVAN) 1 MG tablet Take 1 mg by mouth 4 (four) times daily - after meals and at bedtime.       . methylphenidate (RITALIN) 5 MG tablet Take 1 tablet by mouth 2 (two) times daily with breakfast and lunch.       . metoprolol tartrate (LOPRESSOR) 25 MG tablet Take 1 tablet (25 mg total) by mouth 2 (two) times daily.  180 tablet  0  . Multiple Vitamin (MULTIVITAMIN WITH MINERALS) TABS Take 1 tablet by mouth daily.      Marland Kitchen omeprazole (PRILOSEC) 20 MG capsule Take 20 mg by mouth 2 (two) times daily.       . ondansetron (ZOFRAN) 4 MG tablet Take 1 tablet (4 mg total) by mouth every 8 (eight) hours as needed for nausea or vomiting.  30 tablet  1  . saccharomyces boulardii (FLORASTOR) 250 MG capsule Take one po BID x 6 weeks  60 capsule  1  . sertraline (ZOLOFT) 25 MG tablet Take 25 mg by mouth daily.      . SYMBICORT 160-4.5 MCG/ACT inhaler Inhale 2 puffs into the lungs 2 (two) times daily.       Marland Kitchen warfarin (COUMADIN) 5 MG tablet Take 2.5-5 mg by mouth daily. Take 5 mg every Monday and Friday  then 2.5 mg the rest of the week      . zolpidem (AMBIEN) 5 MG tablet Take 5 mg by mouth at bedtime as needed for sleep.       No current facility-administered medications for this visit.    Past Medical History  Diagnosis Date  . Pancreatitis     ~2008, 11/2011 post ERCP  . Hypertension   . Glaucoma   . High cholesterol   . GERD (gastroesophageal reflux disease)     with HH  . Atrial fibrillation     RVR hx.  Chronic Coumadin  . Pneumonia 12/2011    "first time I know about"  . Type II diabetes mellitus   . Depression   . Chronic lower back pain   . COPD (chronic obstructive pulmonary disease)   . HCAP (healthcare-associated pneumonia) 11/21/2011  . Mitral regurgitation 03/11/2012  . Metabolic encephalopathy 0/10/930  . E-coli UTI 11/13/2011, 11/2013  . SIRS (systemic inflammatory response syndrome) 03/13/2012    a/w post ERCP  pancreatitis.   . C.  difficile colitis 09/21/2013, 11/2013  . Carotid stenosis     a. Carotid US (10/2013):  bilat 1-39%; f/u 1 year  . Sepsis 11/27/2013  . UTI (lower urinary tract infection) 11/23/2013  . Dysrhythmia     A-fib    Past Surgical History  Procedure Laterality Date  . Ercp  11/13/2011    Procedure: ENDOSCOPIC RETROGRADE CHOLANGIOPANCREATOGRAPHY (ERCP);  Surgeon: Beryle Beams, MD;  Location: [facility] ENDOSCOPY;  Service: Endoscopy;  Laterality: N/A;  . Tonsillectomy and adenoidectomy  ~ 1939  . Cholecystectomy  1990's    Lap chole  . Appendectomy  ~ 1960  . Vaginal hysterectomy  ~ 1960's  . Dilation and curettage of uterus    . Cataract extraction w/ intraocular lens  implant, bilateral  2000's  . Colonoscopy  01/2003    diverticulosis, 45mm hyperplastic sigmoid polyp  . Flexible sigmoidoscopy N/A 05/02/2014    Procedure: FLEXIBLE SIGMOIDOSCOPY;  Surgeon: Gatha Mayer, MD;  Location: [facility] ENDOSCOPY;  Service: Endoscopy;  Laterality: N/A;    ROS:  As stated in the HPI and negative for all other systems.   PHYSICAL EXAM BP 124/76  Pulse 92  Ht 5' (1.524 m)  Wt 147 lb 12.8 oz (67.042 kg)  BMI 28.87 kg/m2 PHYSICAL EXAM GENERAL:  Frail appearing HEENT:  Pupils equal round and reactive, fundi not visualized, oral mucosa unremarkable NECK:  No jugular venous distention, waveform within normal limits, carotid upstroke brisk and symmetric, no bruits, no thyromegaly LYMPHATICS:  No cervical, inguinal adenopathy LUNGS:  Clear to auscultation bilaterally BACK:  No CVA tenderness CHEST:  Unremarkable HEART:  PMI not displaced or sustained,S1 and S2 within normal limits, no S3, no clicks, no rubs, no murmurs, irregular ABD:  Flat, positive bowel sounds normal in frequency in pitch, no bruits, no rebound, no guarding, no midline pulsatile mass, no hepatomegaly, no splenomegaly EXT:  2 plus pulses throughout, no edema, no cyanosis no clubbing SKIN:  No rashes no nodules NEURO:  Cranial nerves II through  XII grossly intact, motor grossly intact throughout PSYCH:  Cognitively intact, oriented to person place and time, very weak, flat affect  EKG:  Atrial fibrillation, rate 92, low-voltage limb leads, axis within normal limits, QTC prolonged nonspecific diffuse T wave changes, low voltage.  08/05/12   ASSESSMENT AND PLAN   Atrial fibrillation - The patient  tolerates this rhythm and rate control and anticoagulation. I did discuss with her switching  to Eliquis.  Be interested in this but would like to wait until after the beginning of the year as she is in the "donut hole".    I did review how records from his. She was admitted briefly with some shortness of breath and managed for some volume overload and atrial fib with rapid rate although her pro BNP was only mildly elevated.  HTN (hypertension) -  The blood pressure is at target. No change in medications is indicated. We will continue with therapeutic lifestyle changes (TLC).   Mitral regurgitation -  This was mild on echo earlier this year.  No further imaging is indicated.   Edema -  This is improved. She will continue the medications as listed.  Weakness  - With her hospitalization earlier this year she's had an extensive laboratory workup. She's come off atorvastatin since I last saw her. There has been no etiology for her weakness and she didn't improve with this change in this. At this point  I don't see a cardiac etiology and no further testing is indicated.

## 2014-07-02 ENCOUNTER — Other Ambulatory Visit: Payer: Self-pay | Admitting: *Deleted

## 2014-07-02 MED ORDER — DILTIAZEM HCL ER COATED BEADS 120 MG PO CP24
120.0000 mg | ORAL_CAPSULE | Freq: Every day | ORAL | Status: AC
Start: 2014-07-02 — End: 2015-07-02

## 2014-08-22 ENCOUNTER — Ambulatory Visit (INDEPENDENT_AMBULATORY_CARE_PROVIDER_SITE_OTHER): Payer: Medicare Other | Admitting: Podiatry

## 2014-08-22 ENCOUNTER — Encounter: Payer: Self-pay | Admitting: Podiatry

## 2014-08-22 VITALS — BP 131/85 | HR 112 | Resp 18

## 2014-08-22 DIAGNOSIS — B351 Tinea unguium: Secondary | ICD-10-CM

## 2014-08-22 DIAGNOSIS — M79676 Pain in unspecified toe(s): Secondary | ICD-10-CM

## 2014-08-22 NOTE — Progress Notes (Signed)
   Subjective:    Patient ID: [name] [name], female    DOB: Nov 15, 1929, 80 y.o.   MRN: [id number]  HPI  Patient presents the office with painful elongated toenails. She states her blood sugars have been well controlled. She states that she has had some cramping in her legs at night, which is not new and has been going on for a period of time. She denies any acute change in this.  Patient denies any acute changes in his last appointment. Last A1c was 7.1 on 03/11/2014   Review of Systems  Skin:       Painful toenails  All other systems reviewed and are negative.      Objective:   Physical Exam AAOx3, NAD Decreased pedal pulses b/l. Decreased protective sensation with a Semmes Weinstein monofilament.  Atrophic skin b/l.  Painful, elongated, hypertorphic, brittle, yellow discoloration to the nails x10.  No surrounding erythema, drainage from around the nail site.  No open lesions/pre-ulcerative lesions.  ROM WNL b/l, decreased medial arch height b/l. No calf pain with compression, swelling, warmth, erythema.       Assessment & Plan:  80 year old female with history of diabetes and neuropathy presents with symptomatic onychomycosis. -Treatment options discussed including all alternatives, risks, complications.  -Nail sharply debrided M62 without complication -Importance of daily foot inspection and proper foot care discussed the patient in great detail. -Leg cramping could be due to circulation issues or other problems. The patient/husband state that they have an appointment with her PCP tomorrow. Discussed with them to follow-up with her PCP for this. If symptoms worsen, to call the office. This is not a new issue and has been ongoing.  -Followup in 3 months or sooner if any problems are to occur

## 2014-08-22 NOTE — Patient Instructions (Signed)
Diabetes and Foot Care Diabetes may cause you to have problems because of poor blood supply (circulation) to your feet and legs. This may cause the skin on your feet to become thinner, break easier, and heal more slowly. Your skin may become dry, and the skin may peel and crack. You may also have nerve damage in your legs and feet causing decreased feeling in them. You may not notice minor injuries to your feet that could lead to infections or more serious problems. Taking care of your feet is one of the most important things you can do for yourself.  HOME CARE INSTRUCTIONS  Wear shoes at all times, even in the house. Do not go barefoot. Bare feet are easily injured.  Check your feet daily for blisters, cuts, and redness. If you cannot see the bottom of your feet, use a mirror or ask someone for help.  Wash your feet with warm water (do not use hot water) and mild soap. Then pat your feet and the areas between your toes until they are completely dry. Do not soak your feet as this can dry your skin.  Apply a moisturizing lotion or petroleum jelly (that does not contain alcohol and is unscented) to the skin on your feet and to dry, brittle toenails. Do not apply lotion between your toes.  Trim your toenails straight across. Do not dig under them or around the cuticle. File the edges of your nails with an emery board or nail file.  Do not cut corns or calluses or try to remove them with medicine.  Wear clean socks or stockings every day. Make sure they are not too tight. Do not wear knee-high stockings since they may decrease blood flow to your legs.  Wear shoes that fit properly and have enough cushioning. To break in new shoes, wear them for just a few hours a day. This prevents you from injuring your feet. Always look in your shoes before you put them on to be sure there are no objects inside.  Do not cross your legs. This may decrease the blood flow to your feet.  If you find a minor scrape,  cut, or break in the skin on your feet, keep it and the skin around it clean and dry. These areas may be cleansed with mild soap and water. Do not cleanse the area with peroxide, alcohol, or iodine.  When you remove an adhesive bandage, be sure not to damage the skin around it.  If you have a wound, look at it several times a day to make sure it is healing.  Do not use heating pads or hot water bottles. They may burn your skin. If you have lost feeling in your feet or legs, you may not know it is happening until it is too late.  Make sure your health care provider performs a complete foot exam at least annually or more often if you have foot problems. Report any cuts, sores, or bruises to your health care provider immediately. SEEK MEDICAL CARE IF:   You have an injury that is not healing.  You have cuts or breaks in the skin.  You have an ingrown nail.  You notice redness on your legs or feet.  You feel burning or tingling in your legs or feet.  You have pain or cramps in your legs and feet.  Your legs or feet are numb.  Your feet always feel cold. SEEK IMMEDIATE MEDICAL CARE IF:   There is increasing redness,   swelling, or pain in or around a wound.  There is a red line that goes up your leg.  Pus is coming from a wound.  You develop a fever or as directed by your health care provider.  You notice a bad smell coming from an ulcer or wound. Document Released: 09/18/2000 Document Revised: 05/24/2013 Document Reviewed: 02/28/2013 Exit[employer] Patient Information 2015 [employer]. This information is not intended to replace advice given to you by your health care provider. Make sure you discuss any questions you have with your health care provider.  

## 2014-10-22 ENCOUNTER — Telehealth: Payer: Self-pay | Admitting: Cardiology

## 2014-10-22 ENCOUNTER — Other Ambulatory Visit (HOSPITAL_COMMUNITY): Payer: Self-pay | Admitting: Cardiology

## 2014-10-22 DIAGNOSIS — I6523 Occlusion and stenosis of bilateral carotid arteries: Secondary | ICD-10-CM

## 2014-10-22 NOTE — Telephone Encounter (Signed)
Pt. Informed about her upcoming carotid dopplers

## 2014-10-22 NOTE — Telephone Encounter (Signed)
PT IS CONFUSED ABOUT WHERE/WHEN/WHY HAVING APPT, SHE ASKED HUSBAND TO CALL AND FIND OUT WHY SHE IS HAVING CAROTID, FEELS IT'S OUT OF THE BLUE SINCE HASN'T SEEN [place], PLS ADVISE

## 2014-10-25 ENCOUNTER — Ambulatory Visit (HOSPITAL_COMMUNITY): Payer: Medicare Other | Attending: Cardiology | Admitting: Cardiology

## 2014-10-25 DIAGNOSIS — I6523 Occlusion and stenosis of bilateral carotid arteries: Secondary | ICD-10-CM | POA: Insufficient documentation

## 2014-10-25 NOTE — Progress Notes (Signed)
Carotid duplex performed 

## 2014-11-16 ENCOUNTER — Telehealth: Payer: Self-pay | Admitting: Internal Medicine

## 2014-11-16 NOTE — Telephone Encounter (Signed)
Patient having abdominal pain and cramping in the am.  She will come in and see Dr. Carlean Purl on 11/27/14 3:15.  Appt scheduled with husband

## 2014-11-16 NOTE — Telephone Encounter (Signed)
Left message for patient to call back  

## 2014-11-21 ENCOUNTER — Ambulatory Visit (INDEPENDENT_AMBULATORY_CARE_PROVIDER_SITE_OTHER): Payer: Medicare Other | Admitting: Podiatry

## 2014-11-21 VITALS — BP 152/86 | HR 96 | Resp 13 | Ht 59.0 in | Wt 145.0 lb

## 2014-11-21 DIAGNOSIS — M79676 Pain in unspecified toe(s): Secondary | ICD-10-CM

## 2014-11-21 DIAGNOSIS — B351 Tinea unguium: Secondary | ICD-10-CM

## 2014-11-21 NOTE — Progress Notes (Signed)
   Subjective:    Patient ID: [name] [name], female    DOB: 1930-08-06, 81 y.o.   MRN: [id number]  Diabetes   81 year old female presents the office today for continued nail care. She states that her nails are painful, and elongated. She states that she is able to trim the nails herself. Denies any systemic complaints such as fevers, chills, nausea, vomiting. No other complaints at this time in no acute changes.   Review of Systems  All other systems reviewed and are negative.      Objective:   Physical Exam AAO 3, NAD DP/PT pulses decreased bilaterally, atrophy the skin. Decreased protective sensation with Derrel Nip monofilament, decreased vibratory sensation. Nails are hypertrophic, dystrophic, elongated, brittle, discolored 10. No surrounding erythema or drainage. There subjective tenderness over nails 1 through 5 bilaterally. No open lesions or pre-ulcerative lesions are identified. No other areas of tenderness to bilateral lower extremity is. No overlying edema, erythema, increase in warmth. No pain with calf compression, swelling, warmth, erythema.     Assessment & Plan:  81 year old female with symptomatic onychomycosis -Treatment options discussed including alternatives, risks, complications -Nail sharply debrided 10 without complication/bleeding -Discussed daily foot inspection -Follow-up in 3 months or sooner if any problems are to arise. In the meantime occurs call the office with any questions, concerns, change in symptoms.

## 2014-11-27 ENCOUNTER — Encounter: Payer: Self-pay | Admitting: Internal Medicine

## 2014-11-27 ENCOUNTER — Ambulatory Visit (INDEPENDENT_AMBULATORY_CARE_PROVIDER_SITE_OTHER): Payer: Medicare Other | Admitting: Internal Medicine

## 2014-11-27 VITALS — BP 122/78 | HR 56 | Ht 60.0 in | Wt 143.6 lb

## 2014-11-27 DIAGNOSIS — G4700 Insomnia, unspecified: Secondary | ICD-10-CM

## 2014-11-27 DIAGNOSIS — R110 Nausea: Secondary | ICD-10-CM

## 2014-11-27 DIAGNOSIS — K589 Irritable bowel syndrome without diarrhea: Secondary | ICD-10-CM

## 2014-11-27 MED ORDER — MIRTAZAPINE 7.5 MG PO TABS: 7.5000 mg | ORAL_TABLET | Freq: Every day | ORAL | Status: DC

## 2014-11-27 MED ORDER — LOPERAMIDE HCL 2 MG PO TABS: 2.0000 mg | ORAL_TABLET | ORAL | Status: AC | PRN

## 2014-11-27 NOTE — Progress Notes (Signed)
Subjective:    Patient ID: [name] [name], female    DOB: [birth year], 81 y.o.   MRN: [id number] Chief Complaint is nausea HPI The patient is here with her husband. She is having fairly significant nausea without vomiting. It's mainly a problem in the morning, sometimes there is relief with defecation. She still having 2 or 3 loose bowel movements after breakfast and then mid day which are somewhat unpredictable and limit her travel from the house at times. The evenings seem to be better. She complains of insomnia. She stopped using Ambien recently. She had a checkup with her endocrinologist Dr. Posey Pronto at [facility], her hemoglobin A1c is explained 7. Thyroid function test metabolic profile all normal. He is having some crampy abdominal pain that is relieved with dicyclomine though she's not always using it.  Allergies  Allergen Reactions  . Lactose Intolerance (Gi) Nausea And Vomiting    severe stomach pain  . Penicillins Hives and Itching    "haven't used any since 1970's" "haven't used any since 1970's"  . Sulfa Antibiotics Hives and Itching    "haven't used any since 1970's"  . Ciprofloxacin Swelling  . Sulfasalazine Hives and Itching    "haven't used any since 1970's"   Outpatient Prescriptions Prior to Visit  Medication Sig Dispense Refill  . alum & mag hydroxide-simeth (MAALOX PLUS) 400-400-40 MG/5ML suspension Take 5 mLs by mouth every 6 (six) hours as needed for indigestion.    . dicyclomine (BENTYL) 20 MG tablet Take 0.5 tablets (10 mg total) by mouth every 6 (six) hours as needed for spasms (abdominal pain). 90 tablet 0  . diltiazem (CARDIZEM CD) 120 MG 24 hr capsule Take 1 capsule (120 mg total) by mouth daily. 60 capsule 10  . glimepiride (AMARYL) 4 MG tablet Take 4 mg by mouth daily with breakfast.     . HYDROcodone-acetaminophen (VICODIN) 5-500 MG per tablet Take 1 tablet by mouth every 6 (six) hours as needed for pain.    Marland Kitchen ipratropium-albuterol (DUONEB) 0.5-2.5 (3)  MG/3ML SOLN Take 3 mLs by nebulization 2 (two) times daily.    Marland Kitchen LANTUS SOLOSTAR 100 UNIT/ML Solostar Pen Inject 28 Units into the skin daily.     Marland Kitchen latanoprost (XALATAN) 0.005 % ophthalmic solution Place 1 drop into both eyes at bedtime.    Marland Kitchen LORazepam (ATIVAN) 1 MG tablet Take 1 tab 3 times a day    . methylphenidate (RITALIN) 5 MG tablet take 1 in the morning and 1 at noon    . metoprolol tartrate (LOPRESSOR) 25 MG tablet Take 1 tablet (25 mg total) by mouth 2 (two) times daily. 180 tablet 0  . Multiple Vitamin (MULTIVITAMIN WITH MINERALS) TABS Take 1 tablet by mouth daily.    Marland Kitchen omeprazole (PRILOSEC) 20 MG capsule Take 20 mg by mouth 2 (two) times daily.     . ondansetron (ZOFRAN) 4 MG tablet Take 1 tablet (4 mg total) by mouth every 8 (eight) hours as needed for nausea or vomiting. 30 tablet 1  . SYMBICORT 160-4.5 MCG/ACT inhaler Inhale 2 puffs into the lungs 2 (two) times daily.     Marland Kitchen warfarin (COUMADIN) 5 MG tablet Take 2.5-5 mg by mouth daily. Take 5 mg every Monday and Friday then 2.5 mg the rest of the week    . warfarin (COUMADIN) 5 MG tablet Frequency:   Dosage:0   MG  Instructions:  Note:Take as directed    . zolpidem (AMBIEN) 5 MG tablet Take 5 mg by mouth at bedtime  as needed for sleep.    Marland Kitchen LORazepam (ATIVAN) 1 MG tablet Take 1 mg by mouth 4 (four) times daily - after meals and at bedtime.     . methylphenidate (RITALIN) 5 MG tablet Take 1 tablet by mouth 2 (two) times daily with breakfast and lunch.     . saccharomyces boulardii (FLORASTOR) 250 MG capsule Take one po BID x 6 weeks (Patient not taking: Reported on 11/21/2014) 60 capsule 1  . sertraline (ZOLOFT) 25 MG tablet Take 25 mg by mouth daily.    . sertraline (ZOLOFT) 25 MG tablet take 1 a day (dose reduced)     No facility-administered medications prior to visit.   Past Medical History  Diagnosis Date  . Pancreatitis     ~2008, 11/2011 post ERCP  . Hypertension   . Glaucoma   . High cholesterol   . GERD  (gastroesophageal reflux disease)     with HH  . Atrial fibrillation     RVR hx.  Chronic Coumadin  . Pneumonia 12/2011    "first time I know about"  . Type II diabetes mellitus   . Depression   . Chronic lower back pain   . COPD (chronic obstructive pulmonary disease)   . HCAP (healthcare-associated pneumonia) 11/21/2011  . Mitral regurgitation 03/11/2012  . Metabolic encephalopathy 0/12/5007  . E-coli UTI 11/13/2011, 11/2013  . SIRS (systemic inflammatory response syndrome) 03/13/2012    a/w post ERCP  pancreatitis.   . C. difficile colitis 09/21/2013, 11/2013  . Carotid stenosis     a. Carotid US (10/2013):  bilat 1-39%; f/u 1 year  . Sepsis 11/27/2013  . UTI (lower urinary tract infection) 11/23/2013  . Dysrhythmia     A-fib   Past Surgical History  Procedure Laterality Date  . Ercp  11/13/2011    Procedure: ENDOSCOPIC RETROGRADE CHOLANGIOPANCREATOGRAPHY (ERCP);  Surgeon: Beryle Beams, MD;  Location: [facility] ENDOSCOPY;  Service: Endoscopy;  Laterality: N/A;  . Tonsillectomy and adenoidectomy  ~ 1939  . Cholecystectomy  1990's    Lap chole  . Appendectomy  ~ 1960  . Vaginal hysterectomy  ~ 1960's  . Dilation and curettage of uterus    . Cataract extraction w/ intraocular lens  implant, bilateral  2000's  . Colonoscopy  01/2003    diverticulosis, 51mm hyperplastic sigmoid polyp  . Flexible sigmoidoscopy N/A 05/02/2014    Procedure: FLEXIBLE SIGMOIDOSCOPY;  Surgeon: Gatha Mayer, MD;  Location: [facility] ENDOSCOPY;  Service: Endoscopy;  Laterality: N/A;     Review of Systems As per HPI    Objective:   Physical Exam BP 122/78 mmHg  Pulse 56  Ht 5' (1.524 m)  Wt 143 lb 9.6 oz (65.137 kg)  BMI 28.05 kg/m2  SpO2 97% Elderly overweight to obese white woman in no acute distress. Abdomen is soft and nontender without mass. Mood seems appropriate Alert and oriented 3 She moves slowly getting up and on the exam table and when she gets off       Assessment & Plan:   1. Nausea without  vomiting   2. IBS (irritable bowel syndrome)   3. Insomnia    1. I'm going to treat her nausea and insomnia with a very low dose of mirtazapine 7.5 mg at bedtime. Side effects discussed. This can be cut in half if it provides too much of a sleep affect. 2. He can use loperamide for the diarrhea and continue her dicyclomine. 3. [facility] had sent a message about  Vicodin possibly causing nausea, I explained that was treated and she's had chronic nausea so do not think that's the cause and if she has pain that she needs to use that for as needed she can continue at this point. 4. I will see her back in about 2 months.  I appreciate the opportunity to care for this patient. CC: Donnajean Lopes, MD

## 2014-11-27 NOTE — Patient Instructions (Addendum)
Per Dr. Carlean Purl take Imodium as needed.  Continue your dicyclomine.   We have sent the following medications to your pharmacy for you to pick up at your convenience: Generic Remeron  Follow up with [place] in 2 months, May  3rd 2016 appointment made.  I appreciate the opportunity to care for you. Silvano Rusk, M.D., [facility]

## 2014-12-18 ENCOUNTER — Telehealth: Payer: Self-pay | Admitting: Internal Medicine

## 2014-12-18 NOTE — Telephone Encounter (Signed)
Patient's husband reports that she has had constipation for the last several days. Abdominal pain started on Sunday, she did have a BM on Sunday after having a great deal of pain and straining.  Husband reports that "she almost passed out". They stopped her loperamide on Sunday.  She had a very small BM yesterday and now has abdominal pain again.  He reports that she has been taking dicyclomine and has increased her Vicodin to every 6 hours due to the pain (previously taking once a day).  Her husband is advised that  Vicodin and dicyclomine may worsen her constipation and to take sparingly.  He is advised to try a dose of Miralax once a day to start and increase if needed.  He will call back if her symptoms don't improve.

## 2014-12-19 NOTE — Telephone Encounter (Signed)
agree

## 2014-12-24 ENCOUNTER — Other Ambulatory Visit: Payer: Self-pay

## 2014-12-24 ENCOUNTER — Ambulatory Visit: Payer: Medicare Other | Admitting: Internal Medicine

## 2014-12-24 MED ORDER — DICYCLOMINE HCL 20 MG PO TABS: 10.0000 mg | ORAL_TABLET | Freq: Four times a day (QID) | ORAL | Status: DC | PRN

## 2014-12-24 NOTE — Telephone Encounter (Signed)
[facility] called and I verbally Ok'ed patients dicyclomine after running it by Dr. Carlean Purl .

## 2015-02-05 ENCOUNTER — Encounter: Payer: Self-pay | Admitting: Internal Medicine

## 2015-02-05 ENCOUNTER — Ambulatory Visit (INDEPENDENT_AMBULATORY_CARE_PROVIDER_SITE_OTHER): Payer: Medicare Other | Admitting: Internal Medicine

## 2015-02-05 VITALS — BP 124/66 | HR 76 | Ht 60.0 in | Wt 145.2 lb

## 2015-02-05 DIAGNOSIS — G4700 Insomnia, unspecified: Secondary | ICD-10-CM | POA: Diagnosis not present

## 2015-02-05 DIAGNOSIS — K589 Irritable bowel syndrome without diarrhea: Secondary | ICD-10-CM

## 2015-02-05 DIAGNOSIS — R110 Nausea: Secondary | ICD-10-CM | POA: Diagnosis not present

## 2015-02-05 MED ORDER — DICYCLOMINE HCL 20 MG PO TABS: ORAL_TABLET | ORAL | Status: DC

## 2015-02-05 NOTE — Patient Instructions (Addendum)
We have made you a follow up appointment with Dr. Carlean Purl for July 25th at 2:45 PM.   Dr. Carlean Purl has adjusted the directions on your dicyclomine, see medicine list and take accordingly.    I appreciate the opportunity to care for you. Silvano Rusk, M.D., [facility]

## 2015-02-05 NOTE — Progress Notes (Signed)
Subjective:    Patient ID: [name] [name], female    DOB: [birth year], 81 y.o.   MRN: [id number] Chief complaint: Nausea, abdominal pain HPI The patient is here with her husband. She and he reports she is improved with sleeping well on the 7.5 mg dose of Remeron. Nausea is better it sounds and appetite is a little better though she still struggles with these 2 symptoms some. She still gets pre-defecation crampy lower abdominal pain. In March she became significantly constipated and stopped regular loperamide. She is not using that as needed to try to control diarrhea. Her husband is concerned that she is on numerous medications but he understands that she has multiple complaints that physicians are trying to help her with. She uses 5 given for back pain 2 or 3 times a day. She ambulates with a walker.  Allergies  Allergen Reactions  . Lactose Intolerance (Gi) Nausea And Vomiting    severe stomach pain  . Penicillins Hives and Itching    "haven't used any since 1970's" "haven't used any since 1970's"  . Sulfa Antibiotics Hives and Itching    "haven't used any since 1970's"  . Ciprofloxacin Swelling  . Sulfasalazine Hives and Itching    "haven't used any since 1970's"   Outpatient Prescriptions Prior to Visit  Medication Sig Dispense Refill  . alum & mag hydroxide-simeth (MAALOX PLUS) 400-400-40 MG/5ML suspension Take 5 mLs by mouth every 6 (six) hours as needed for indigestion.    Marland Kitchen diltiazem (CARDIZEM CD) 120 MG 24 hr capsule Take 1 capsule (120 mg total) by mouth daily. 60 capsule 10  . glimepiride (AMARYL) 4 MG tablet Take 4 mg by mouth daily with breakfast.     . HYDROcodone-acetaminophen (VICODIN) 5-500 MG per tablet Take 1 tablet by mouth every 6 (six) hours as needed for pain.    Marland Kitchen ipratropium-albuterol (DUONEB) 0.5-2.5 (3) MG/3ML SOLN Take 3 mLs by nebulization 2 (two) times daily.    Marland Kitchen LANTUS SOLOSTAR 100 UNIT/ML Solostar Pen Inject 28 Units into the skin daily.     Marland Kitchen  latanoprost (XALATAN) 0.005 % ophthalmic solution Place 1 drop into both eyes at bedtime.    Marland Kitchen loperamide (LOPERAMIDE A-D) 2 MG tablet Take 1 tablet (2 mg total) by mouth as needed for diarrhea or loose stools (use at bedtime). 30 tablet 0  . LORazepam (ATIVAN) 1 MG tablet Take 1 tab 3 times a day    . methylphenidate (RITALIN) 5 MG tablet take 1 in the morning and 1 at noon    . metoprolol tartrate (LOPRESSOR) 25 MG tablet Take 1 tablet (25 mg total) by mouth 2 (two) times daily. 180 tablet 0  . mirtazapine (REMERON) 7.5 MG tablet Take 1 tablet (7.5 mg total) by mouth at bedtime. 30 tablet 2  . Multiple Vitamin (MULTIVITAMIN WITH MINERALS) TABS Take 1 tablet by mouth daily.    Marland Kitchen omeprazole (PRILOSEC) 20 MG capsule Take 20 mg by mouth 2 (two) times daily.     . ondansetron (ZOFRAN) 4 MG tablet Take 1 tablet (4 mg total) by mouth every 8 (eight) hours as needed for nausea or vomiting. 30 tablet 1  . SYMBICORT 160-4.5 MCG/ACT inhaler Inhale 2 puffs into the lungs 2 (two) times daily.     Marland Kitchen warfarin (COUMADIN) 5 MG tablet Take 2.5-5 mg by mouth daily. Take 5 mg every Monday and Friday then 2.5 mg the rest of the week    . dicyclomine (BENTYL) 20 MG tablet Take 0.5  tablets (10 mg total) by mouth every 6 (six) hours as needed for spasms (abdominal pain). 90 tablet 4  . warfarin (COUMADIN) 5 MG tablet Frequency:   Dosage:0   MG  Instructions:  Note:Take as directed    . zolpidem (AMBIEN) 5 MG tablet Take 5 mg by mouth at bedtime as needed for sleep.     No facility-administered medications prior to visit.   Past Medical History  Diagnosis Date  . Pancreatitis     ~2008, 11/2011 post ERCP  . Hypertension   . Glaucoma   . High cholesterol   . GERD (gastroesophageal reflux disease)     with HH  . Atrial fibrillation     RVR hx.  Chronic Coumadin  . Pneumonia 12/2011    "first time I know about"  . Type II diabetes mellitus   . Depression   . Chronic lower back pain   . COPD (chronic obstructive  pulmonary disease)   . HCAP (healthcare-associated pneumonia) 11/21/2011  . Mitral regurgitation 03/11/2012  . Metabolic encephalopathy 10/10/1094  . E-coli UTI 11/13/2011, 11/2013  . SIRS (systemic inflammatory response syndrome) 03/13/2012    a/w post ERCP  pancreatitis.   . C. difficile colitis 09/21/2013, 11/2013  . Carotid stenosis     a. Carotid US (10/2013):  bilat 1-39%; f/u 1 year  . Sepsis 11/27/2013  . UTI (lower urinary tract infection) 11/23/2013  . Dysrhythmia     A-fib  . Atrial fibrillation with RVR 03/10/2014   Past Surgical History  Procedure Laterality Date  . Ercp  11/13/2011    Procedure: ENDOSCOPIC RETROGRADE CHOLANGIOPANCREATOGRAPHY (ERCP);  Surgeon: Beryle Beams, MD;  Location: [facility] ENDOSCOPY;  Service: Endoscopy;  Laterality: N/A;  . Tonsillectomy and adenoidectomy  ~ 1939  . Cholecystectomy  1990's    Lap chole  . Appendectomy  ~ 1960  . Vaginal hysterectomy  ~ 1960's  . Dilation and curettage of uterus    . Cataract extraction w/ intraocular lens  implant, bilateral  2000's  . Colonoscopy  01/2003    diverticulosis, 13mm hyperplastic sigmoid polyp  . Flexible sigmoidoscopy N/A 05/02/2014    Procedure: FLEXIBLE SIGMOIDOSCOPY;  Surgeon: Gatha Mayer, MD;  Location: [facility] ENDOSCOPY;  Service: Endoscopy;  Laterality: N/A;   History   Social History  . Marital Status: Married    Spouse Name: [name]  . Number of Children: 0  . Years of Education: N/A   Social History Main Topics  . Smoking status: Never Smoker   . Smokeless tobacco: Never Used  . Alcohol Use: No  . Drug Use: No  . Sexual Activity: Not Currently   Other Topics Concern  . None   Social History Narrative   Lives at home with her husband.   No children    Review of Systems As above    Objective:   Physical Exam BP 124/66 mmHg  Pulse 76  Ht 5' (1.524 m)  Wt 145 lb 4 oz (65.885 kg)  BMI 28.37 kg/m2 Elderly and chronically ill ww NAD    Assessment & Plan:  IBS (irritable bowel  syndrome)  Insomnia  Nausea without vomiting  Better overall I explained that it is difficult if not impossible to eradicate her symptoms. Polypharmacy is a concern but in her case are not sure we can avoid it at this point. She is seeing psychiatry and primary care regularly.  I'm going to have her try dicyclomine 10 mg every morning to see if that relieves her  pre-defecate Torri cramps. She will use it as needed as well.  She will ask psychiatry if she can simple fiber regimen any.  I will see her again in late July routinely. Sooner if needed. I suspect her C. difficile colitis that she had in 2014 in 2015 exacerbated her IBS and is causing some of these problems. Conservative workup is appropriate given her age, she and her husband understand that I will not be investigating these chronic recurrent symptoms with other tests at this point.   I appreciate the opportunity to care for this patient. CC: Donnajean Lopes, MD Lendon Colonel, M.D.

## 2015-02-16 ENCOUNTER — Other Ambulatory Visit: Payer: Self-pay | Admitting: Internal Medicine

## 2015-02-20 ENCOUNTER — Ambulatory Visit (INDEPENDENT_AMBULATORY_CARE_PROVIDER_SITE_OTHER): Payer: Medicare Other | Admitting: Podiatry

## 2015-02-20 ENCOUNTER — Encounter: Payer: Self-pay | Admitting: Podiatry

## 2015-02-20 VITALS — BP 144/80 | HR 82 | Resp 18

## 2015-02-20 DIAGNOSIS — M79676 Pain in unspecified toe(s): Secondary | ICD-10-CM

## 2015-02-20 DIAGNOSIS — B351 Tinea unguium: Secondary | ICD-10-CM | POA: Diagnosis not present

## 2015-02-27 NOTE — Progress Notes (Signed)
Patient ID: [name] [name], female   DOB: January 29, 1930, 81 y.o.   MRN: [id number]  Subjective: 81 y.o.-year-old female returns the office today for painful, elongated, thickened toenails which she is unable to trim herself. Denies any redness or drainage around the nails. Denies any acute changes since last appointment and no new complaints today. Denies any systemic complaints such as fevers, chills, nausea, vomiting.   Objective: AAO 3, NAD DP/PT pulses decreased Protective sensation decreased with Simms Weinstein monofilament Nails hypertrophic, dystrophic, elongated, brittle, discolored 10. There is tenderness overlying the nails 1-5 bilaterally. There is no surrounding erythema or drainage along the nail sites. No open lesions or pre-ulcerative lesions are identified. No other areas of tenderness bilateral lower extremities. No overlying edema, erythema, increased warmth. No pain with calf compression, swelling, warmth, erythema.  Assessment: Patient presents with symptomatic onychomycosis  Plan: -Treatment options including alternatives, risks, complications were discussed -Nails sharply debrided 10 without complication/bleeding. -Discussed daily foot inspection. If there are any changes, to call the office immediately.  -Follow-up in 3 months or sooner if any problems are to arise. In the meantime, encouraged to call the office with any questions, concerns, changes symptoms.

## 2015-03-16 ENCOUNTER — Other Ambulatory Visit: Payer: Self-pay | Admitting: Internal Medicine

## 2015-03-26 ENCOUNTER — Telehealth: Payer: Self-pay | Admitting: Cardiology

## 2015-03-29 NOTE — Telephone Encounter (Signed)
Closed encounter °

## 2015-04-20 ENCOUNTER — Other Ambulatory Visit: Payer: Self-pay | Admitting: Internal Medicine

## 2015-04-22 NOTE — Telephone Encounter (Signed)
Ok to refill Sir? 

## 2015-04-23 NOTE — Telephone Encounter (Signed)
Refill x 12 

## 2015-04-29 ENCOUNTER — Encounter: Payer: Self-pay | Admitting: Internal Medicine

## 2015-04-29 ENCOUNTER — Ambulatory Visit (INDEPENDENT_AMBULATORY_CARE_PROVIDER_SITE_OTHER): Payer: Medicare Other | Admitting: Internal Medicine

## 2015-04-29 VITALS — BP 122/64 | HR 84 | Ht 60.0 in | Wt 147.5 lb

## 2015-04-29 DIAGNOSIS — G8929 Other chronic pain: Secondary | ICD-10-CM | POA: Diagnosis not present

## 2015-04-29 DIAGNOSIS — K589 Irritable bowel syndrome without diarrhea: Secondary | ICD-10-CM

## 2015-04-29 DIAGNOSIS — M545 Low back pain: Secondary | ICD-10-CM | POA: Diagnosis not present

## 2015-04-29 DIAGNOSIS — N3946 Mixed incontinence: Secondary | ICD-10-CM

## 2015-04-29 NOTE — Patient Instructions (Signed)
Please return to Dr. Philip Aspen regarding your back pain and urinary symptoms.

## 2015-04-29 NOTE — Progress Notes (Signed)
   Subjective:    Patient ID: [name] [name], female    DOB: [birth year], 81 y.o.   MRN: [id number] Cc: back and lower abd pain HPI  Having increasing bilat lower abdominal pain but mostly low back pain. Increasing in past few weeks and positional - worse when walking and moving about. Also has urge and spontaneous urinary incontinence - unaware of urinary leakage until notices pads/Depends are wet. No fecal incontinence. Medications, allergies, past medical history, past surgical history, family history and social history are reviewed and updated in the EMR.  Review of Systems As above    Objective:   Physical Exam BP 122/64 mmHg  Pulse 84  Ht 5' (1.524 m)  Wt 147 lb 8 oz (66.906 kg)  BMI 28.81 kg/m2 Elderly - slow moving in pain (back with walking Back is nontender She has weak hip flexors Alert and oriented  Has pain in R>L hip and back with return to leg extension and hip rotation but no straight leg raise sign    Assessment & Plan:  Chronic low back pain  Mixed urinanry incontinence  IBS (irritable bowel syndrome)  I think her main issues today ae not GI in origin and I recommend she see her PCP re: increasing back pain. ? If urinary incontinence is related - probably separate. Further plans per PCP. See me prn   I appreciate the opportunity to care for this patient.  WY:OVZCHYIF,OYDXAJ G, MD

## 2015-05-24 ENCOUNTER — Telehealth: Payer: Self-pay | Admitting: *Deleted

## 2015-05-24 ENCOUNTER — Ambulatory Visit: Payer: Medicare Other | Admitting: Podiatry

## 2015-05-24 NOTE — Telephone Encounter (Signed)
[name], [name] states due to health issues pt needs to cancel today's 100pm appt and please call to reschedule.

## 2015-06-03 ENCOUNTER — Ambulatory Visit (INDEPENDENT_AMBULATORY_CARE_PROVIDER_SITE_OTHER): Payer: Medicare Other | Admitting: Podiatry

## 2015-06-03 ENCOUNTER — Encounter: Payer: Self-pay | Admitting: Podiatry

## 2015-06-03 VITALS — BP 131/71 | HR 70 | Resp 16

## 2015-06-03 DIAGNOSIS — M79676 Pain in unspecified toe(s): Secondary | ICD-10-CM | POA: Diagnosis not present

## 2015-06-03 DIAGNOSIS — B351 Tinea unguium: Secondary | ICD-10-CM | POA: Diagnosis not present

## 2015-06-03 NOTE — Progress Notes (Signed)
Patient ID: [name] [name], female   DOB: 1930/10/02, 81 y.o.   MRN: [id number]  Subjective: 81 y.o.-year-old female returns the office today for painful, elongated, thickened toenails which she is unable to trim herself. Denies any redness or drainage around the nails. Denies any acute changes since last appointment and no new complaints today. Denies any systemic complaints such as fevers, chills, nausea, vomiting.   Objective: AAO 3, NAD; presents with her husband DP/PT pulses 1/4 bilaterally, CRT less than 3 seconds Protective sensation decreased with Simms Weinstein monofilament Nails hypertrophic, dystrophic, elongated, brittle, discolored 10. There is tenderness overlying the nails 1-5 bilaterally. There is no surrounding erythema or drainage along the nail sites. No open lesions or pre-ulcerative lesions are identified. No other areas of tenderness bilateral lower extremities. No overlying edema, erythema, increased warmth. No pain with calf compression, swelling, warmth, erythema.  Assessment: Patient presents with symptomatic onychomycosis  Plan: -Treatment options including alternatives, risks, complications were discussed -Nails sharply debrided 10 without complication/bleeding. -Discussed daily foot inspection. If there are any changes, to call the office immediately.  -Follow-up in 3 months or sooner if any problems are to arise. In the meantime, encouraged to call the office with any questions, concerns, changes symptoms.  Celesta Gentile, DPM

## 2015-06-14 ENCOUNTER — Ambulatory Visit (INDEPENDENT_AMBULATORY_CARE_PROVIDER_SITE_OTHER): Payer: Medicare Other | Admitting: Cardiology

## 2015-06-14 ENCOUNTER — Encounter: Payer: Self-pay | Admitting: Cardiology

## 2015-06-14 VITALS — BP 132/76 | HR 87 | Ht 60.0 in | Wt 145.0 lb

## 2015-06-14 DIAGNOSIS — I4891 Unspecified atrial fibrillation: Secondary | ICD-10-CM

## 2015-06-14 DIAGNOSIS — I10 Essential (primary) hypertension: Secondary | ICD-10-CM | POA: Diagnosis not present

## 2015-06-14 NOTE — Progress Notes (Signed)
HPI The patient presents for evaluation of atrial fibrillation.  Since I last saw her she  Has done well.   She gets around with a walker.  The patient denies any new symptoms such as chest discomfort, neck or arm discomfort. There has been no new shortness of breath, PND or orthopnea. There have been no reported palpitations, presyncope or syncope.   She's had no bleeding issues with her warfarin.  Allergies  Allergen Reactions  . Lactose Intolerance (Gi) Nausea And Vomiting    severe stomach pain  . Penicillins Hives and Itching    "haven't used any since 1970's" "haven't used any since 1970's"  . Sulfa Antibiotics Hives and Itching    "haven't used any since 1970's"  . Ciprofloxacin Swelling  . Sulfasalazine Hives and Itching    "haven't used any since 1970's"    Current Outpatient Prescriptions  Medication Sig Dispense Refill  . alum & mag hydroxide-simeth (MAALOX PLUS) 400-400-40 MG/5ML suspension Take 5 mLs by mouth every 6 (six) hours as needed for indigestion.    . dicyclomine (BENTYL) 20 MG tablet Every AM and then every 6 hrs as needed 90 tablet 4  . diltiazem (CARDIZEM CD) 120 MG 24 hr capsule Take 1 capsule (120 mg total) by mouth daily. 60 capsule 10  . gabapentin (NEURONTIN) 100 MG capsule Take 1 capsule by mouth 2 (two) times daily.    Marland Kitchen glimepiride (AMARYL) 4 MG tablet Take 4 mg by mouth daily with breakfast.     . HYDROcodone-acetaminophen (NORCO/VICODIN) 5-325 MG per tablet Take 1 tablet by mouth every 6 (six) hours as needed.     Marland Kitchen ipratropium-albuterol (DUONEB) 0.5-2.5 (3) MG/3ML SOLN Take 3 mLs by nebulization 2 (two) times daily.    Marland Kitchen LANTUS SOLOSTAR 100 UNIT/ML Solostar Pen Inject 28 Units into the skin daily.     Marland Kitchen latanoprost (XALATAN) 0.005 % ophthalmic solution Place 1 drop into both eyes at bedtime.    Marland Kitchen loperamide (LOPERAMIDE A-D) 2 MG tablet Take 1 tablet (2 mg total) by mouth as needed for diarrhea or loose stools (use at bedtime). 30 tablet 0  .  LORazepam (ATIVAN) 1 MG tablet Take 1 tab 3 times a day    . methylphenidate (RITALIN) 5 MG tablet take 1 in the morning and 1 at noon    . metoprolol tartrate (LOPRESSOR) 25 MG tablet Take 1 tablet (25 mg total) by mouth 2 (two) times daily. 180 tablet 0  . mirtazapine (REMERON) 7.5 MG tablet TAKE 1 TABLET BY MOUTH DAILY AT BEDTIME 30 tablet 11  . Multiple Vitamin (MULTIVITAMIN WITH MINERALS) TABS Take 1 tablet by mouth daily.    Marland Kitchen omeprazole (PRILOSEC) 20 MG capsule Take 20 mg by mouth 2 (two) times daily.     . ondansetron (ZOFRAN) 4 MG tablet Take 1 tablet (4 mg total) by mouth every 8 (eight) hours as needed for nausea or vomiting. 30 tablet 1  . SYMBICORT 160-4.5 MCG/ACT inhaler Inhale 2 puffs into the lungs 2 (two) times daily.     . timolol (TIMOPTIC) 0.5 % ophthalmic solution   2  . warfarin (COUMADIN) 5 MG tablet Take 2.5-5 mg by mouth daily. Take 5 mg every Monday and Friday then 2.5 mg the rest of the week     No current facility-administered medications for this visit.    Past Medical History  Diagnosis Date  . Pancreatitis     ~2008, 11/2011 post ERCP  . Hypertension   .  Glaucoma   . High cholesterol   . GERD (gastroesophageal reflux disease)     with HH  . Atrial fibrillation     RVR hx.  Chronic Coumadin  . Pneumonia 12/2011    "first time I know about"  . Type II diabetes mellitus   . Depression   . Chronic lower back pain   . COPD (chronic obstructive pulmonary disease)   . HCAP (healthcare-associated pneumonia) 11/21/2011  . Mitral regurgitation 03/11/2012  . Metabolic encephalopathy 05/05/2750  . E-coli UTI 11/13/2011, 11/2013  . SIRS (systemic inflammatory response syndrome) 03/13/2012    a/w post ERCP  pancreatitis.   . C. difficile colitis 09/21/2013, 11/2013  . Carotid stenosis     a. Carotid US (10/2013):  bilat 1-39%; f/u 1 year  . Sepsis 11/27/2013  . UTI (lower urinary tract infection) 11/23/2013  . Dysrhythmia     A-fib  . Atrial fibrillation with RVR 03/10/2014     Past Surgical History  Procedure Laterality Date  . Ercp  11/13/2011    Procedure: ENDOSCOPIC RETROGRADE CHOLANGIOPANCREATOGRAPHY (ERCP);  Surgeon: Beryle Beams, MD;  Location: [facility] ENDOSCOPY;  Service: Endoscopy;  Laterality: N/A;  . Tonsillectomy and adenoidectomy  ~ 1939  . Cholecystectomy  1990's    Lap chole  . Appendectomy  ~ 1960  . Vaginal hysterectomy  ~ 1960's  . Dilation and curettage of uterus    . Cataract extraction w/ intraocular lens  implant, bilateral  2000's  . Colonoscopy  01/2003    diverticulosis, 64mm hyperplastic sigmoid polyp  . Flexible sigmoidoscopy N/A 05/02/2014    Procedure: FLEXIBLE SIGMOIDOSCOPY;  Surgeon: Gatha Mayer, MD;  Location: [facility] ENDOSCOPY;  Service: Endoscopy;  Laterality: N/A;    ROS:  As stated in the HPI and negative for all other systems.   PHYSICAL EXAM BP 132/76 mmHg  Pulse 87  Ht 5' (1.524 m)  Wt 145 lb (65.772 kg)  BMI 28.32 kg/m2 PHYSICAL EXAM GENERAL:  Frail appearing NECK:  No jugular venous distention, waveform within normal limits, carotid upstroke brisk and symmetric, no bruits, no thyromegaly LUNGS:  Clear to auscultation bilaterally CHEST:  Unremarkable HEART:  PMI not displaced or sustained,S1 and S2 within normal limits, no S3, no clicks, no rubs, no murmurs, irregular ABD:  Flat, positive bowel sounds normal in frequency in pitch, no bruits, no rebound, no guarding, no midline pulsatile mass, no hepatomegaly, no splenomegaly EXT:  2 plus pulses throughout, no edema, no cyanosis no clubbing  ASSESSMENT AND PLAN   Atrial fibrillation - The patient  tolerates this rhythm and rate control and anticoagulation. No change in therapy is indicated.  I will  Ask her primary provider to send her most recent CBC.  [name] has a CHA2DS2 - VASc score of 4 with a risk of stroke of 4%.  HTN (hypertension) -  The blood pressure is at target. No change in medications is indicated. We will continue with therapeutic  lifestyle changes (TLC).   Mitral regurgitation -  This was mild on echo earlier last year.  No further imaging is indicated.   Carotid stenosis - We will follow this up again in two years.  It was very mild.

## 2015-06-14 NOTE — Patient Instructions (Signed)
Your physician wants you to follow-up in: 1 Year. You will receive a reminder letter in the mail two months in advance. If you don't receive a letter, please call our office to schedule the follow-up appointment.  

## 2015-07-29 ENCOUNTER — Other Ambulatory Visit: Payer: Self-pay | Admitting: *Deleted

## 2015-07-29 MED ORDER — METOPROLOL TARTRATE 25 MG PO TABS
25.0000 mg | ORAL_TABLET | Freq: Two times a day (BID) | ORAL | Status: DC
Start: 2015-07-29 — End: 2016-05-02

## 2015-07-30 ENCOUNTER — Encounter: Payer: Self-pay | Admitting: Cardiology

## 2015-08-05 ENCOUNTER — Other Ambulatory Visit: Payer: Self-pay

## 2015-08-05 MED ORDER — DILTIAZEM HCL ER 120 MG PO CP24: 120.0000 mg | ORAL_CAPSULE | Freq: Every day | ORAL | Status: DC

## 2015-09-06 ENCOUNTER — Ambulatory Visit: Payer: Medicare Other | Admitting: Podiatry

## 2015-09-18 ENCOUNTER — Other Ambulatory Visit (HOSPITAL_COMMUNITY): Payer: Self-pay | Admitting: *Deleted

## 2015-09-19 ENCOUNTER — Encounter (HOSPITAL_COMMUNITY)
Admission: RE | Admit: 2015-09-19 | Discharge: 2015-09-19 | Disposition: A | Discharge status: Home / Self Care | Payer: Medicare Other | Source: Ambulatory Visit | Attending: Internal Medicine | Admitting: Internal Medicine

## 2015-09-19 DIAGNOSIS — M810 Age-related osteoporosis without current pathological fracture: Secondary | ICD-10-CM | POA: Insufficient documentation

## 2015-09-19 MED ORDER — DENOSUMAB 60 MG/ML ~~LOC~~ SOLN
60.0000 mg | Freq: Once | SUBCUTANEOUS | Status: AC
  Administered 2015-09-19: 60 mg via SUBCUTANEOUS
  Filled 2015-09-19: qty 1

## 2015-09-23 ENCOUNTER — Ambulatory Visit (INDEPENDENT_AMBULATORY_CARE_PROVIDER_SITE_OTHER): Payer: Medicare Other | Admitting: Podiatry

## 2015-09-23 DIAGNOSIS — M79676 Pain in unspecified toe(s): Secondary | ICD-10-CM | POA: Diagnosis not present

## 2015-09-23 DIAGNOSIS — B351 Tinea unguium: Secondary | ICD-10-CM | POA: Diagnosis not present

## 2015-09-23 NOTE — Progress Notes (Signed)
Patient ID: [name] [name], female   DOB: 12/19/1929, 81 y.o.   MRN: [id number]  Subjective: 81 y.o.-year-old female returns the office today for painful, elongated, thickened toenails which she is unable to trim herself. Denies any redness or drainage around the nails. Denies any acute changes since last appointment and no new complaints today. Denies any systemic complaints such as fevers, chills, nausea, vomiting.   Objective: AAO 3, NAD; presents with her husband DP/PT pulses 1/4 bilaterally, CRT less than 3 seconds Protective sensation decreased with Simms Weinstein monofilament Nails hypertrophic, dystrophic, elongated, brittle, discolored 10. There is tenderness overlying the nails 1-5 bilaterally. There is no surrounding erythema or drainage along the nail sites. No open lesions or pre-ulcerative lesions are identified. No other areas of tenderness bilateral lower extremities. No overlying edema, erythema, increased warmth. No pain with calf compression, swelling, warmth, erythema.  Assessment: Patient presents with symptomatic onychomycosis  Plan: -Treatment options including alternatives, risks, complications were discussed -Nails sharply debrided 10 without complication/bleeding. -Discussed daily foot inspection. If there are any changes, to call the office immediately.  -Follow-up in 3 months or sooner if any problems are to arise. In the meantime, encouraged to call the office with any questions, concerns, changes symptoms.  Matthew Wagoner, DPM  

## 2015-12-23 ENCOUNTER — Ambulatory Visit (INDEPENDENT_AMBULATORY_CARE_PROVIDER_SITE_OTHER): Payer: Medicare Other | Admitting: Podiatry

## 2015-12-23 ENCOUNTER — Encounter: Payer: Self-pay | Admitting: Podiatry

## 2015-12-23 DIAGNOSIS — B351 Tinea unguium: Secondary | ICD-10-CM

## 2015-12-23 DIAGNOSIS — M79676 Pain in unspecified toe(s): Secondary | ICD-10-CM

## 2015-12-23 DIAGNOSIS — G629 Polyneuropathy, unspecified: Secondary | ICD-10-CM

## 2015-12-23 NOTE — Progress Notes (Signed)
Patient ID: [name] [name], female   DOB: [birth year], 82 y.o.   MRN: [id number]  Subjective: 82 y.o.-year-old female returns the office today for painful, elongated, thickened toenails which she is unable to trim herself. Denies any redness or drainage around the nails. Denies any acute changes since last appointment and no new complaints today. Denies any systemic complaints such as fevers, chills, nausea, vomiting.   Objective: AAO 3, NAD; presents with her husband DP/PT pulses 1/4 bilaterally, CRT less than 3 seconds Protective sensation decreased with Simms Weinstein monofilament Nails hypertrophic, dystrophic, elongated, brittle, discolored 10. There is tenderness overlying the nails 1-5 bilaterally. There is no surrounding erythema or drainage along the nail sites. No open lesions or pre-ulcerative lesions are identified. No other areas of tenderness bilateral lower extremities. No overlying edema, erythema, increased warmth. No pain with calf compression, swelling, warmth, erythema.  Assessment: Patient presents with symptomatic onychomycosis  Plan: -Treatment options including alternatives, risks, complications were discussed -Nails sharply debrided 10 without complication/bleeding. -Discussed daily foot inspection. If there are any changes, to call the office immediately.  -Follow-up in 3 months or sooner if any problems are to arise. In the meantime, encouraged to call the office with any questions, concerns, changes symptoms.  Celesta Gentile, DPM

## 2016-03-27 ENCOUNTER — Other Ambulatory Visit (HOSPITAL_COMMUNITY): Payer: Self-pay | Admitting: *Deleted

## 2016-03-30 ENCOUNTER — Ambulatory Visit (HOSPITAL_COMMUNITY)
Admission: RE | Admit: 2016-03-30 | Discharge: 2016-03-30 | Disposition: A | Discharge status: Home / Self Care | Payer: Medicare Other | Source: Ambulatory Visit | Attending: Internal Medicine | Admitting: Internal Medicine

## 2016-03-30 ENCOUNTER — Encounter: Payer: Self-pay | Admitting: Podiatry

## 2016-03-30 ENCOUNTER — Ambulatory Visit (INDEPENDENT_AMBULATORY_CARE_PROVIDER_SITE_OTHER): Payer: Medicare Other | Admitting: Podiatry

## 2016-03-30 DIAGNOSIS — B351 Tinea unguium: Secondary | ICD-10-CM | POA: Diagnosis not present

## 2016-03-30 DIAGNOSIS — M79676 Pain in unspecified toe(s): Secondary | ICD-10-CM

## 2016-03-30 DIAGNOSIS — M810 Age-related osteoporosis without current pathological fracture: Secondary | ICD-10-CM | POA: Insufficient documentation

## 2016-03-30 DIAGNOSIS — G629 Polyneuropathy, unspecified: Secondary | ICD-10-CM

## 2016-03-30 MED ORDER — DENOSUMAB 60 MG/ML ~~LOC~~ SOLN
60.0000 mg | Freq: Once | SUBCUTANEOUS | Status: AC
  Administered 2016-03-30: 60 mg via SUBCUTANEOUS
  Filled 2016-03-30: qty 1

## 2016-03-31 NOTE — Progress Notes (Signed)
Patient ID: [name] [name], female   DOB: 06/19/1930, 82 y.o.   MRN: [id number]  Subjective: 82 y.o.-year-old female returns the office today for painful, elongated, thickened toenails which she is unable to trim herself. Denies any redness or drainage around the nails. Denies any acute changes since last appointment and no new complaints today. Denies any systemic complaints such as fevers, chills, nausea, vomiting.   Objective: AAO 3, NAD; presents with her husband DP/PT pulses 1/4 bilaterally, CRT less than 3 seconds Protective sensation decreased with Simms Weinstein monofilament Nails hypertrophic, dystrophic, elongated, brittle, discolored 10. There is tenderness overlying the nails 1-5 bilaterally. There is no surrounding erythema or drainage along the nail sites. No open lesions or pre-ulcerative lesions are identified. No other areas of tenderness bilateral lower extremities. No overlying edema, erythema, increased warmth. No pain with calf compression, swelling, warmth, erythema.  Assessment: Patient presents with symptomatic onychomycosis  Plan: -Treatment options including alternatives, risks, complications were discussed -Nails sharply debrided 10 without complication/bleeding. -Discussed daily foot inspection. If there are any changes, to call the office immediately.  -Follow-up in 3 months or sooner if any problems are to arise. In the meantime, encouraged to call the office with any questions, concerns, changes symptoms.  Vishnu Moeller, DPM  

## 2016-04-10 ENCOUNTER — Telehealth: Payer: Self-pay

## 2016-04-10 NOTE — Telephone Encounter (Signed)
Left message to call me back.  We got office notes from [facility] that she needs to be seen.  Per Dr Carlean Purl the nausea is a chronic issue for her.  I'm trying to reach her to put her on the schedule and to get a symptom update , medicine update also so I can forward this information to Dr Carlean Purl to make any medicine changes prior to her visit with [place].

## 2016-04-14 NOTE — Telephone Encounter (Signed)
They called back and we attempted to go over the list on the phone which wasn't easy so they are going to bring their meds up here tomorrow so we can go over them in person.

## 2016-04-15 NOTE — Telephone Encounter (Signed)
  [name] (husband) came into the office today and we went over [name] [name]'s medicine list to make sure ours is up to date.  [name] [name]'s nausea is esp in the AM but she never throws up per [name].  The carafate  QID seems to be helping.  She is on promethazine 25mg  one TID now instead of the zofran. I have updated her medicine list to reflect these changes.  We made her an appointment for 05/27/16 3:15pm.

## 2016-04-16 NOTE — Telephone Encounter (Signed)
Ondansetron 4mg q 8 hrs prn nausea/vomiting # 60 1 RF

## 2016-04-16 NOTE — Telephone Encounter (Signed)
Do you want her to continue her promethazine and carafate Sir?

## 2016-04-16 NOTE — Telephone Encounter (Signed)
See if ondansetron works as opposed to [employer] on carafate

## 2016-04-16 NOTE — Telephone Encounter (Signed)
Left message to call me back.

## 2016-04-17 ENCOUNTER — Telehealth: Payer: Self-pay | Admitting: Internal Medicine

## 2016-04-17 MED ORDER — ONDANSETRON HCL 4 MG PO TABS
4.0000 mg | ORAL_TABLET | Freq: Three times a day (TID) | ORAL | Status: DC | PRN
Start: 2016-04-17 — End: 2016-04-27

## 2016-04-17 NOTE — Telephone Encounter (Signed)
Patient's husband informed of medicine changes. Ondansetron sent in.

## 2016-04-17 NOTE — Telephone Encounter (Signed)
Questions answered regarding the dosage of the generic zofran.

## 2016-04-24 ENCOUNTER — Telehealth: Payer: Self-pay | Admitting: Internal Medicine

## 2016-04-24 NOTE — Telephone Encounter (Signed)
Please advise Sir, thanks. 

## 2016-04-27 ENCOUNTER — Other Ambulatory Visit: Payer: Self-pay | Admitting: Internal Medicine

## 2016-04-27 NOTE — Telephone Encounter (Signed)
I am sorry but I do not - side effects preclude Rxing of other meds  Suggest trying some OTC ginger compounds - maybe that would help

## 2016-04-27 NOTE — Telephone Encounter (Signed)
Spoke with [name] [name] and her husband.  They said that the promethazine is working well now.  She takes 1-2 daily. They said the zofran was going to cost to much.  I suggested the ginger to try as well.  [name] [name] will see Dr Carlean Purl on 05/27/16.

## 2016-05-02 ENCOUNTER — Other Ambulatory Visit: Payer: Self-pay | Admitting: Cardiology

## 2016-05-04 NOTE — Telephone Encounter (Signed)
REFILL 

## 2016-05-21 ENCOUNTER — Telehealth: Payer: Self-pay | Admitting: Internal Medicine

## 2016-05-21 NOTE — Telephone Encounter (Signed)
Spoke with [name] [name]'s husband and answered questions.  They have multiple medicine questions for their appointment next week.  She's had "stomach problems" for a week now he said. She's not running a fever.  Symptoms of constipation and then diarrhea and vomited this AM.  Feels some better now and she is resting.  She missed several doses of her carafate and he was concerned.

## 2016-05-27 ENCOUNTER — Encounter: Payer: Self-pay | Admitting: Internal Medicine

## 2016-05-27 ENCOUNTER — Ambulatory Visit (INDEPENDENT_AMBULATORY_CARE_PROVIDER_SITE_OTHER): Payer: Medicare Other | Admitting: Internal Medicine

## 2016-05-27 VITALS — BP 124/64 | HR 76 | Ht 60.0 in | Wt 145.6 lb

## 2016-05-27 DIAGNOSIS — R110 Nausea: Secondary | ICD-10-CM

## 2016-05-27 DIAGNOSIS — G43A0 Cyclical vomiting, not intractable: Secondary | ICD-10-CM

## 2016-05-27 DIAGNOSIS — R1115 Cyclical vomiting syndrome unrelated to migraine: Secondary | ICD-10-CM

## 2016-05-27 DIAGNOSIS — K589 Irritable bowel syndrome without diarrhea: Secondary | ICD-10-CM

## 2016-05-27 NOTE — Progress Notes (Signed)
   Subjective:    Patient ID: [name] [name], female    DOB: [birth year], 82 y.o.   MRN: [id number] Cc: nausea w/ vomiting HPI  Here w/ husband - has chronic nausea which has intensified over past few months. Now has had some vomiting also and she thinks carafate triggered that so she stopped it. Also w/ some urgent defecation problems. Tried to rx Zofran but was too expensive. Not dizzy/vertiginous Medications, allergies, past medical history, past surgical history, family history and social history are reviewed and updated in the EMR.   Review of Systems As above, moves and walks slowly    Objective:   Physical Exam BP 124/64 (BP Location: Left Arm, Patient Position: Sitting, Cuff Size: Normal)   Pulse 76   Ht 5' (1.524 m)   Wt 145 lb 9.6 oz (66 kg)   BMI 28.44 kg/m  Eyes anicteric Lungs cta Cor s1s2 no rmg abd soft and NT Slow sl unsteady gait  Wt Readings from Last 3 Encounters:  05/27/16 145 lb 9.6 oz (66 kg)  06/14/15 145 lb (65.8 kg)  04/29/15 147 lb 8 oz (66.9 kg)    Assessment & Plan:   Encounter Diagnoses  Name Primary?  . Non-intractable cyclical vomiting with nausea Yes  . Chronic nausea   . IBS (irritable bowel syndrome)     Vomiting is new Doubt it was from carafate Last EGD 2005 Will rescope to see if anything has changed tough seems likely chronic functional GI disturbance likely and also possibly medication side effects. The risks and benefits as well as alternatives of endoscopic procedure(s) have been discussed and reviewed. All questions answered. The patient agrees to proceed.  Will do EGD on warfarin - no plans to dilate or do any high-risk maneuver  I appreciate the opportunity to care for this patient.  [id number] G, MD

## 2016-05-27 NOTE — Patient Instructions (Addendum)
  You have been scheduled for an endoscopy. Please follow written instructions given to you at your visit today. If you use inhalers (even only as needed), please bring them with you on the day of your procedure.  Do not stop your warfarin for the procedure.    I appreciate the opportunity to care for you. Silvano Rusk, MD, [facility]

## 2016-05-28 ENCOUNTER — Encounter: Payer: Self-pay | Admitting: Internal Medicine

## 2016-05-31 ENCOUNTER — Encounter: Payer: Self-pay | Admitting: Internal Medicine

## 2016-06-01 ENCOUNTER — Encounter: Payer: Self-pay | Admitting: Cardiology

## 2016-06-02 ENCOUNTER — Ambulatory Visit (AMBULATORY_SURGERY_CENTER): Payer: Medicare Other | Admitting: Internal Medicine

## 2016-06-02 ENCOUNTER — Encounter: Payer: Self-pay | Admitting: Internal Medicine

## 2016-06-02 VITALS — BP 136/67 | HR 64 | Temp 97.5°F | Resp 11 | Ht 60.0 in | Wt 145.0 lb

## 2016-06-02 DIAGNOSIS — G43A0 Cyclical vomiting, not intractable: Secondary | ICD-10-CM

## 2016-06-02 DIAGNOSIS — K2990 Gastroduodenitis, unspecified, without bleeding: Secondary | ICD-10-CM | POA: Diagnosis not present

## 2016-06-02 DIAGNOSIS — K259 Gastric ulcer, unspecified as acute or chronic, without hemorrhage or perforation: Secondary | ICD-10-CM | POA: Diagnosis not present

## 2016-06-02 DIAGNOSIS — K2970 Gastritis, unspecified, without bleeding: Secondary | ICD-10-CM | POA: Diagnosis not present

## 2016-06-02 DIAGNOSIS — R1115 Cyclical vomiting syndrome unrelated to migraine: Secondary | ICD-10-CM

## 2016-06-02 LAB — GLUCOSE, CAPILLARY
GLUCOSE-CAPILLARY: 150 mg/dL — AB (ref 65–99)
Glucose-Capillary: 138 mg/dL — ABNORMAL HIGH (ref 65–99)

## 2016-06-02 MED ORDER — SODIUM CHLORIDE 0.9 % IV SOLN: 500.0000 mL | INTRAVENOUS | Status: DC

## 2016-06-02 NOTE — Progress Notes (Signed)
Called to room to assist during endoscopic procedure.  Patient ID and intended procedure confirmed with present staff. Received instructions for my participation in the procedure from the performing physician.  

## 2016-06-02 NOTE — Op Note (Signed)
[place] Patient Name: [name] [name] Procedure Date: 06/02/2016 9:06 AM MRN: [id number] Endoscopist: Gatha Mayer , MD Age: 82 Referring MD:  Date of Birth: Jul 06, 1930 Gender: Female Account #: [id number] Procedure:                Upper GI endoscopy Indications:              Nausea with vomiting, Persistent vomiting of                            unknown cause Medicines:                Propofol per Anesthesia, Monitored Anesthesia Care Procedure:                Pre-Anesthesia Assessment:                           - Prior to the procedure, a History and Physical                            was performed, and patient medications and                            allergies were reviewed. The patient's tolerance of                            previous anesthesia was also reviewed. The risks                            and benefits of the procedure and the sedation                            options and risks were discussed with the patient.                            All questions were answered, and informed consent                            was obtained. Anticoagulants: The patient has taken                            Coumadin (warfarin). It was decided not to withhold                            this medication prior to procedure. ASA Grade                            Assessment: III - A patient with severe systemic                            disease. After reviewing the risks and benefits,                            the patient was deemed in satisfactory condition to  undergo the procedure.                           After obtaining informed consent, the endoscope was                            passed under direct vision. Throughout the                            procedure, the patient's blood pressure, pulse, and                            oxygen saturations were monitored continuously. The                            Model GIF-[phone])  scope was introduced                            through the mouth, and advanced to the second part                            of duodenum. The upper GI endoscopy was                            accomplished without difficulty. The patient                            tolerated the procedure well. Scope In: Scope Out: Findings:                 The examined esophagus was moderately tortuous.                           Patchy moderate inflammation characterized by                            congestion (edema), erosions and erythema was found                            in the gastric antrum. Biopsies were taken with a                            cold forceps for histology. Verification of patient                            identification for the specimen was done. Estimated                            blood loss was minimal.                           The exam was otherwise without abnormality.                           The cardia and gastric fundus were normal on  retroflexion. Complications:            No immediate complications. Estimated blood loss:                            Minimal. Estimated Blood Loss:     Estimated blood loss was minimal. Impression:               - Tortuous esophagus.                           - Gastritis. Biopsied.                           - The examination was otherwise normal. Recommendation:           - Patient has a contact number available for                            emergencies. The signs and symptoms of potential                            delayed complications were discussed with the                            patient. Return to normal activities tomorrow.                            Written discharge instructions were provided to the                            patient.                           - Resume previous diet.                           - Continue present medications.                           - Resume Coumadin (warfarin) at  prior dose today.                           - Await pathology results. ? if cause of sxs here -                            probably need to look at med list carefully to see                            if we can modify Gatha Mayer, MD 06/02/2016 9:25:18 AM This report has been signed electronically.

## 2016-06-02 NOTE — Patient Instructions (Signed)
YOU HAD AN ENDOSCOPIC PROCEDURE TODAY AT [facility]:   Refer to the procedure report that was given to you for any specific questions about what was found during the examination.  If the procedure report does not answer your questions, please call your gastroenterologist to clarify.  If you requested that your care partner not be given the details of your procedure findings, then the procedure report has been included in a sealed envelope for you to review at your convenience later.  YOU SHOULD EXPECT: Some feelings of bloating in the abdomen. Passage of more gas than usual.  Walking can help get rid of the air that was put into your GI tract during the procedure and reduce the bloating. If you had a lower endoscopy (such as a colonoscopy or flexible sigmoidoscopy) you may notice spotting of blood in your stool or on the toilet paper. If you underwent a bowel prep for your procedure, you may not have a normal bowel movement for a few days.  Please Note:  You might notice some irritation and congestion in your nose or some drainage.  This is from the oxygen used during your procedure.  There is no need for concern and it should clear up in a day or so.  SYMPTOMS TO REPORT IMMEDIATELY:   Following upper endoscopy (EGD)  Vomiting of blood or coffee ground material  New chest pain or pain under the shoulder blades  Painful or persistently difficult swallowing  New shortness of breath  Fever of 100F or higher  Black, tarry-looking stools  For urgent or emergent issues, a gastroenterologist can be reached at any hour by calling [phone].   DIET:  We do recommend a small meal at first, but then you may proceed to your regular diet.  Drink plenty of fluids but you should avoid alcoholic beverages for 24 hours.  ACTIVITY:  You should plan to take it easy for the rest of today and you should NOT DRIVE or use heavy machinery until tomorrow (because of the sedation medicines used  during the test).    FOLLOW UP: Our staff will call the number listed on your records the next business day following your procedure to check on you and address any questions or concerns that you may have regarding the information given to you following your procedure. If we do not reach you, we will leave a message.  However, if you are feeling well and you are not experiencing any problems, there is no need to return our call.  We will assume that you have returned to your regular daily activities without incident.  If any biopsies were taken you will be contacted by phone or by letter within the next 1-3 weeks.  Please call us at [phone] if you have not heard about the biopsies in 3 weeks.    SIGNATURES/CONFIDENTIALITY: You and/or your care partner have signed paperwork which will be entered into your electronic medical record.  These signatures attest to the fact that that the information above on your After Visit Summary has been reviewed and is understood.  Full responsibility of the confidentiality of this discharge information lies with you and/or your care-partner.  Resume Coumadin today. Please read Gastritis handout provided. Await biopsy results.

## 2016-06-02 NOTE — Progress Notes (Signed)
Teeth unchanged after procedure.A and O x3. Report to RN. Tolerated MAC anesthesia well. 

## 2016-06-03 ENCOUNTER — Telehealth: Payer: Self-pay

## 2016-06-03 NOTE — Telephone Encounter (Signed)
  Follow up Call-  Call back number 06/02/2016  Post procedure Call Back phone  # [phone]  Permission to leave phone message Yes  Some recent data might be hidden     Patient questions:  Do you have a fever, pain , or abdominal swelling? No. Pain Score  0 *  Have you tolerated food without any problems? Yes.    Have you been able to return to your normal activities? Yes.    Do you have any questions about your discharge instructions: Diet   No. Medications  No. Follow up visit  No.  Do you have questions or concerns about your Care? No.  Actions: * If pain score is 4 or above: No action needed, pain <4.

## 2016-06-04 NOTE — Progress Notes (Signed)
Labs show mild stomach inflammation - does not tell us why she is nauseous In the past she was on mirtazapine 7.5 mg qhs an my notes say it helped - she is not on that anymore and I do not see side effects or allergies to it Ask her/husband to restart 7.5 mg mirtazapine qhs # 30 3 RF and see me in Nov or Dec -it should help her nausea and eating problems If there were problems with this med let me know what they were  [place] - no letter or recall

## 2016-06-05 ENCOUNTER — Other Ambulatory Visit: Payer: Self-pay

## 2016-06-05 MED ORDER — MIRTAZAPINE 7.5 MG PO TABS: 7.5000 mg | ORAL_TABLET | Freq: Every day | ORAL | 3 refills | Status: DC

## 2016-06-14 NOTE — Progress Notes (Signed)
HPI The patient presents for evaluation of atrial fibrillation.  Since I last saw her she she has had no new cardiovascular complaints. She does seem to have some memory problems.  She gets around with a walker.  The patient denies any new symptoms such as chest discomfort, neck or arm discomfort. There has been no new shortness of breath, PND or orthopnea. There have been no reported palpitations, presyncope or syncope.   She's had no bleeding issues with her warfarin.  Allergies  Allergen Reactions  . Lactose Intolerance (Gi) Nausea And Vomiting    severe stomach pain  . Penicillins Hives and Itching    "haven't used any since 1970's" "haven't used any since 1970's"  . Sulfa Antibiotics Hives and Itching    "haven't used any since 1970's"  . Ciprofloxacin Swelling  . Sulfasalazine Hives and Itching    "haven't used any since 1970's"    Current Outpatient Prescriptions  Medication Sig Dispense Refill  . dicyclomine (BENTYL) 20 MG tablet Every AM and then every 6 hrs as needed (Patient taking differently: 10 mg. Every AM and then every 6 hrs as needed) 90 tablet 4  . diltiazem (DILACOR XR) 120 MG 24 hr capsule Take 1 capsule (120 mg total) by mouth daily. 60 capsule 6  . gabapentin (NEURONTIN) 100 MG capsule Take 1 capsule by mouth 2 (two) times daily.    Marland Kitchen glimepiride (AMARYL) 4 MG tablet Take 4 mg by mouth daily with breakfast.     . HYDROcodone-acetaminophen (NORCO/VICODIN) 5-325 MG per tablet Take 1 tablet by mouth every 6 (six) hours as needed.     Marland Kitchen LANTUS SOLOSTAR 100 UNIT/ML Solostar Pen Inject 28 Units into the skin daily.     Marland Kitchen latanoprost (XALATAN) 0.005 % ophthalmic solution Place 1 drop into both eyes at bedtime.    Marland Kitchen loperamide (LOPERAMIDE A-D) 2 MG tablet Take 1 tablet (2 mg total) by mouth as needed for diarrhea or loose stools (use at bedtime). 30 tablet 0  . LORazepam (ATIVAN) 1 MG tablet Take 1 tab 3 times a day    . methylphenidate (RITALIN) 5 MG tablet take 1  in the morning and 1 at noon    . metoprolol tartrate (LOPRESSOR) 25 MG tablet Take 1 tablet (25 mg total) by mouth 2 (two) times daily. NEED OV. 180 tablet 0  . mirtazapine (REMERON) 7.5 MG tablet Take 1 tablet (7.5 mg total) by mouth at bedtime. 30 tablet 3  . Multiple Vitamin (MULTIVITAMIN WITH MINERALS) TABS Take 1 tablet by mouth daily.    Marland Kitchen omeprazole (PRILOSEC) 20 MG capsule Take 20 mg by mouth 2 (two) times daily.     . promethazine (PHENERGAN) 25 MG tablet Take 25 mg by mouth 3 (three) times daily as needed for nausea or vomiting.    . sucralfate (CARAFATE) 1 g tablet Take 1 g by mouth 4 (four) times daily -  with meals and at bedtime.    . SYMBICORT 160-4.5 MCG/ACT inhaler Inhale 2 puffs into the lungs 2 (two) times daily.     . timolol (TIMOPTIC) 0.5 % ophthalmic solution   2  . warfarin (COUMADIN) 5 MG tablet Take 2.5-5 mg by mouth daily. Take 5 mg every Monday and Friday then 2.5 mg the rest of the week     Current Facility-Administered Medications  Medication Dose Route Frequency Provider Last Rate Last Dose  . 0.9 %  sodium chloride infusion  500 mL Intravenous Continuous Ofilia Neas  Carlean Purl, MD        Past Medical History:  Diagnosis Date  . Atrial fibrillation (HCC)    RVR hx.  Chronic Coumadin  . C. difficile colitis 09/21/2013, 11/2013  . Carotid stenosis    a. Carotid US (10/2013):  bilat 1-39%; f/u 1 year  . Chronic lower back pain   . COPD (chronic obstructive pulmonary disease) ([place])   . Depression   . GERD (gastroesophageal reflux disease)    with HH  . Glaucoma   . HCAP (healthcare-associated pneumonia) 11/21/2011  . High cholesterol   . Hypertension   . Metabolic encephalopathy 0000000  . Mitral regurgitation 03/11/2012  . Pancreatitis    ~2008, 11/2011 post ERCP  . Pneumonia 12/2011   "first time I know about"  . Sepsis ([place]) 11/27/2013  . SIRS (systemic inflammatory response syndrome) ([place]) 03/13/2012   a/w post ERCP  pancreatitis.   . Type II diabetes mellitus  ([place])     Past Surgical History:  Procedure Laterality Date  . APPENDECTOMY  ~ 1960  . CATARACT EXTRACTION W/ INTRAOCULAR LENS  IMPLANT, BILATERAL  2000's  . CHOLECYSTECTOMY  1990's   Lap chole  . COLONOSCOPY  01/2003   diverticulosis, 35mm hyperplastic sigmoid polyp  . DILATION AND CURETTAGE OF UTERUS    . ERCP  11/13/2011   Procedure: ENDOSCOPIC RETROGRADE CHOLANGIOPANCREATOGRAPHY (ERCP);  Surgeon: Beryle Beams, MD;  Location: [facility] ENDOSCOPY;  Service: Endoscopy;  Laterality: N/A;  . FLEXIBLE SIGMOIDOSCOPY N/A 05/02/2014   Procedure: FLEXIBLE SIGMOIDOSCOPY;  Surgeon: Gatha Mayer, MD;  Location: [facility] ENDOSCOPY;  Service: Endoscopy;  Laterality: N/A;  . TONSILLECTOMY AND ADENOIDECTOMY  ~ 1939  . VAGINAL HYSTERECTOMY  ~ 1960's    ROS:  Positive for balance problems, anxiety, chronic back problems, loose bowels. Otherwise as stated in the HPI and negative for all other systems.   PHYSICAL EXAM BP 122/77   Pulse 81   Ht 5' (1.524 m)   Wt 141 lb 12.8 oz (64.3 kg)   BMI 27.69 kg/m  PHYSICAL EXAM GENERAL:  Frail appearing NECK:  No jugular venous distention, waveform within normal limits, carotid upstroke brisk and symmetric, no bruits, no thyromegaly LUNGS:  Clear to auscultation bilaterally CHEST:  Unremarkable HEART:  PMI not displaced or sustained,S1 and S2 within normal limits, no S3, no clicks, no rubs, no murmurs, irregular ABD:  Flat, positive bowel sounds normal in frequency in pitch, no bruits, no rebound, no guarding, no midline pulsatile mass, no hepatomegaly, no splenomegaly EXT:  2 plus pulses throughout, no edema, no cyanosis no clubbing  EKG:  Atrial fibrillation, rate 81, axis within normal limits, intervals within normal limits, no acute ST-T wave changes lower voltage in limb leads. 06/16/2016  ASSESSMENT AND PLAN   Atrial fibrillation - The patient  tolerates this rhythm and rate control and anticoagulation. No change in therapy is indicated  .  Ms. Gentry  Murray-Rush has a CHA2DS2 - VASc score of 4 with a risk of stroke of 4%.  HTN (hypertension) -  The blood pressure is at target. No change in medications is indicated. We will continue with therapeutic lifestyle changes (TLC).   Mitral regurgitation -  This was mild on echo earlier in 2015.  No further imaging is indicated.   Carotid stenosis - She had mild plaque in Jan 2016 and will have follow up next Jan.

## 2016-06-16 ENCOUNTER — Encounter: Payer: Self-pay | Admitting: Cardiology

## 2016-06-16 ENCOUNTER — Ambulatory Visit (INDEPENDENT_AMBULATORY_CARE_PROVIDER_SITE_OTHER): Payer: Medicare Other | Admitting: Cardiology

## 2016-06-16 VITALS — BP 122/77 | HR 81 | Ht 60.0 in | Wt 141.8 lb

## 2016-06-16 DIAGNOSIS — I4891 Unspecified atrial fibrillation: Secondary | ICD-10-CM | POA: Diagnosis not present

## 2016-06-16 NOTE — Patient Instructions (Signed)

## 2016-07-06 ENCOUNTER — Encounter: Payer: Self-pay | Admitting: Podiatry

## 2016-07-06 ENCOUNTER — Ambulatory Visit (INDEPENDENT_AMBULATORY_CARE_PROVIDER_SITE_OTHER): Payer: Medicare Other | Admitting: Podiatry

## 2016-07-06 DIAGNOSIS — B351 Tinea unguium: Secondary | ICD-10-CM

## 2016-07-06 DIAGNOSIS — M79676 Pain in unspecified toe(s): Secondary | ICD-10-CM

## 2016-07-06 DIAGNOSIS — G629 Polyneuropathy, unspecified: Secondary | ICD-10-CM

## 2016-07-06 NOTE — Progress Notes (Signed)
Patient ID: [name] [name], female   DOB: 07/04/1930, 82 y.o.   MRN: [id number]  Subjective: 82 y.o.-year-old female returns the office today for painful, elongated, thickened toenails which she is unable to trim herself. Denies any redness or drainage around the nails. Denies any acute changes since last appointment and no new complaints today. Denies any systemic complaints such as fevers, chills, nausea, vomiting.   Objective: AAO 3, NAD; presents with her husband DP/PT pulses 1/4 bilaterally, CRT less than 3 seconds Protective sensation decreased with Simms Weinstein monofilament Nails hypertrophic, dystrophic, elongated, brittle, discolored 10. There is tenderness overlying the nails 1-5 bilaterally. There is no surrounding erythema or drainage along the nail sites. No open lesions or pre-ulcerative lesions are identified. No other areas of tenderness bilateral lower extremities. No overlying edema, erythema, increased warmth. No pain with calf compression, swelling, warmth, erythema.  Assessment: Patient presents with symptomatic onychomycosis  Plan: -Treatment options including alternatives, risks, complications were discussed -Nails sharply debrided 10 without complication/bleeding. -Discussed daily foot inspection. If there are any changes, to call the office immediately.  -Follow-up in 3 months or sooner if any problems are to arise. In the meantime, encouraged to call the office with any questions, concerns, changes symptoms.  Matthew Wagoner, DPM  

## 2016-07-29 DIAGNOSIS — N3281 Overactive bladder: Secondary | ICD-10-CM | POA: Insufficient documentation

## 2016-08-03 ENCOUNTER — Other Ambulatory Visit: Payer: Self-pay | Admitting: *Deleted

## 2016-08-03 ENCOUNTER — Other Ambulatory Visit: Payer: Self-pay | Admitting: Cardiology

## 2016-08-03 MED ORDER — METOPROLOL TARTRATE 25 MG PO TABS: 25.0000 mg | ORAL_TABLET | Freq: Two times a day (BID) | ORAL | 0 refills | Status: DC

## 2016-08-14 ENCOUNTER — Encounter (INDEPENDENT_AMBULATORY_CARE_PROVIDER_SITE_OTHER): Payer: Self-pay

## 2016-08-14 ENCOUNTER — Encounter: Payer: Self-pay | Admitting: Internal Medicine

## 2016-08-14 ENCOUNTER — Ambulatory Visit (INDEPENDENT_AMBULATORY_CARE_PROVIDER_SITE_OTHER): Payer: Medicare Other | Admitting: Internal Medicine

## 2016-08-14 VITALS — BP 102/62 | HR 84 | Ht 60.0 in | Wt 142.0 lb

## 2016-08-14 DIAGNOSIS — R110 Nausea: Secondary | ICD-10-CM

## 2016-08-14 DIAGNOSIS — K580 Irritable bowel syndrome with diarrhea: Secondary | ICD-10-CM | POA: Diagnosis not present

## 2016-08-14 NOTE — Progress Notes (Signed)
[name] [name] 82 y.o. 11-Jan-1930 [id number]  Assessment & Plan:   Encounter Diagnoses  Name Primary?  . Irritable bowel syndrome with diarrhea Yes  . Chronic nausea    She is stable to improved with a multitude of symptoms. No changes today. See me in late January, may cancel if doing well but it has been shown that regular follow-up with her seems to make sense. I have no problem with her using Tums intermittently if needed. She and her husband understand that there are limits of therapy and we need to be careful about overmedication if we can, and she is comfortable with leaving things were they are at this time. Note that when she was last here I prescribed ondansetron but it was cost prohibitive. I was hoping that would help with nausea and diarrhea. He will continue regular follow-up with primary care for her general medical problems.  I appreciate the opportunity to care for this patient. CC: Donnajean Lopes, MD  Subjective:   Chief Complaint:Follow-up of IBS and chronic nausea  HPI Patient was last seen in August. At this point her current regimen has allowed for improvement in nausea and diarrhea. She still has intermittent severe cramps and diarrhea and apparently had transient loss of consciousness at some point possibly related to that. There is occasional regurgitation. He is here with her husband as usual and he provides a fair amount of the history.  Allergies  Allergen Reactions  . Lactose Intolerance (Gi) Nausea And Vomiting    severe stomach pain  . Penicillins Hives and Itching    "haven't used any since 1970's" "haven't used any since 1970's"  . Sulfa Antibiotics Hives and Itching    "haven't used any since 1970's"  . Ciprofloxacin Swelling  . Sulfasalazine Hives and Itching    "haven't used any since 1970's"   Outpatient Medications Prior to Visit  Medication Sig Dispense Refill  . dicyclomine (BENTYL) 20 MG tablet Every AM and then every 6 hrs as  needed (Patient taking differently: 10 mg. Every AM and then every 6 hrs as needed) 90 tablet 4  . diltiazem (DILACOR XR) 120 MG 24 hr capsule Take 1 capsule (120 mg total) by mouth daily. 60 capsule 6  . gabapentin (NEURONTIN) 100 MG capsule Take 1 capsule by mouth 2 (two) times daily.    Marland Kitchen glimepiride (AMARYL) 4 MG tablet Take 4 mg by mouth daily with breakfast.     . HYDROcodone-acetaminophen (NORCO/VICODIN) 5-325 MG per tablet Take 1 tablet by mouth every 6 (six) hours as needed.     Marland Kitchen LANTUS SOLOSTAR 100 UNIT/ML Solostar Pen Inject 28 Units into the skin daily.     Marland Kitchen latanoprost (XALATAN) 0.005 % ophthalmic solution Place 1 drop into both eyes at bedtime.    Marland Kitchen loperamide (LOPERAMIDE A-D) 2 MG tablet Take 1 tablet (2 mg total) by mouth as needed for diarrhea or loose stools (use at bedtime). 30 tablet 0  . LORazepam (ATIVAN) 1 MG tablet Take 1 tab 3 times a day    . methylphenidate (RITALIN) 5 MG tablet take 1 in the morning and 1 at noon    . metoprolol tartrate (LOPRESSOR) 25 MG tablet TAKE 1 TABLET(25 MG) BY MOUTH TWICE DAILY 180 tablet 3  . mirtazapine (REMERON) 7.5 MG tablet Take 1 tablet (7.5 mg total) by mouth at bedtime. 30 tablet 3  . Multiple Vitamin (MULTIVITAMIN WITH MINERALS) TABS Take 1 tablet by mouth daily.    Marland Kitchen omeprazole (PRILOSEC) 20  MG capsule Take 20 mg by mouth 2 (two) times daily.     . promethazine (PHENERGAN) 25 MG tablet Take 25 mg by mouth 3 (three) times daily as needed for nausea or vomiting.    . sucralfate (CARAFATE) 1 g tablet Take 1 g by mouth 4 (four) times daily -  with meals and at bedtime.    . SYMBICORT 160-4.5 MCG/ACT inhaler Inhale 2 puffs into the lungs 2 (two) times daily.     . timolol (TIMOPTIC) 0.5 % ophthalmic solution   2  . warfarin (COUMADIN) 5 MG tablet Take 2.5-5 mg by mouth daily. Take 5 mg every Monday and Friday then 2.5 mg the rest of the week     Facility-Administered Medications Prior to Visit  Medication Dose Route Frequency Provider  Last Rate Last Dose  . 0.9 %  sodium chloride infusion  500 mL Intravenous Continuous Gatha Mayer, MD       Past Medical History:  Diagnosis Date  . Atrial fibrillation (HCC)    RVR hx.  Chronic Coumadin  . C. difficile colitis 09/21/2013, 11/2013  . Carotid stenosis    a. Carotid US (10/2013):  bilat 1-39%; f/u 1 year  . Chronic lower back pain   . COPD (chronic obstructive pulmonary disease) ([place])   . Depression   . GERD (gastroesophageal reflux disease)    with HH  . Glaucoma   . HCAP (healthcare-associated pneumonia) 11/21/2011  . High cholesterol   . Hypertension   . Metabolic encephalopathy 0000000  . Mitral regurgitation 03/11/2012  . Pancreatitis    ~2008, 11/2011 post ERCP  . Pneumonia 12/2011   "first time I know about"  . Sepsis ([place]) 11/27/2013  . SIRS (systemic inflammatory response syndrome) ([place]) 03/13/2012   a/w post ERCP  pancreatitis.   . Type II diabetes mellitus ([place])    Past Surgical History:  Procedure Laterality Date  . APPENDECTOMY  ~ 1960  . CATARACT EXTRACTION W/ INTRAOCULAR LENS  IMPLANT, BILATERAL  2000's  . CHOLECYSTECTOMY  1990's   Lap chole  . COLONOSCOPY  01/2003   diverticulosis, 54mm hyperplastic sigmoid polyp  . DILATION AND CURETTAGE OF UTERUS    . ERCP  11/13/2011   Procedure: ENDOSCOPIC RETROGRADE CHOLANGIOPANCREATOGRAPHY (ERCP);  Surgeon: Beryle Beams, MD;  Location: [facility] ENDOSCOPY;  Service: Endoscopy;  Laterality: N/A;  . FLEXIBLE SIGMOIDOSCOPY N/A 05/02/2014   Procedure: FLEXIBLE SIGMOIDOSCOPY;  Surgeon: Gatha Mayer, MD;  Location: [facility] ENDOSCOPY;  Service: Endoscopy;  Laterality: N/A;  . TONSILLECTOMY AND ADENOIDECTOMY  ~ 1939  . VAGINAL HYSTERECTOMY  ~ 1960's   Social History   Social History  . Marital status: Married    Spouse name: [name]  . Number of children: 0  . Years of education: N/A   Social History Main Topics  . Smoking status: Never Smoker  . Smokeless tobacco: Never Used  . Alcohol use No  . Drug use: No  . Sexual  activity: Not Currently   Other Topics Concern  . None   Social History Narrative   Lives at home with her husband.   No children   Family History  Problem Relation Age of Onset  . Stroke Father 38  . Stroke Mother 31  . Anesthesia problems Neg Hx   . Hypotension Neg Hx   . Malignant hyperthermia Neg Hx   . Pseudochol deficiency Neg Hx   . Stomach cancer Neg Hx   . Esophageal cancer Neg Hx  Review of Systems As above. Moves slowly. Some memory disturbance. Mild it seems.  Objective:   Physical Exam BP 102/62 (BP Location: Left Arm, Patient Position: Sitting, Cuff Size: Normal)   Pulse 84 Comment: irregular  Ht 5' (1.524 m)   Wt 142 lb (64.4 kg)   BMI 27.73 kg/m  Elderly white woman in no acute distress   15 minutes time spent with patient > half in counseling coordination of care   Current Outpatient Prescriptions:  .  dicyclomine (BENTYL) 20 MG tablet, Every AM and then every 6 hrs as needed (Patient taking differently: 10 mg. Every AM and then every 6 hrs as needed), Disp: 90 tablet, Rfl: 4 .  diltiazem (DILACOR XR) 120 MG 24 hr capsule, Take 1 capsule (120 mg total) by mouth daily., Disp: 60 capsule, Rfl: 6 .  gabapentin (NEURONTIN) 100 MG capsule, Take 1 capsule by mouth 2 (two) times daily., Disp: , Rfl:  .  glimepiride (AMARYL) 4 MG tablet, Take 4 mg by mouth daily with breakfast. , Disp: , Rfl:  .  HYDROcodone-acetaminophen (NORCO/VICODIN) 5-325 MG per tablet, Take 1 tablet by mouth every 6 (six) hours as needed. , Disp: , Rfl:  .  LANTUS SOLOSTAR 100 UNIT/ML Solostar Pen, Inject 28 Units into the skin daily. , Disp: , Rfl:  .  latanoprost (XALATAN) 0.005 % ophthalmic solution, Place 1 drop into both eyes at bedtime., Disp: , Rfl:  .  loperamide (LOPERAMIDE A-D) 2 MG tablet, Take 1 tablet (2 mg total) by mouth as needed for diarrhea or loose stools (use at bedtime)., Disp: 30 tablet, Rfl: 0 .  LORazepam (ATIVAN) 1 MG tablet, Take 1 tab 3 times a day,  Disp: , Rfl:  .  methylphenidate (RITALIN) 5 MG tablet, take 1 in the morning and 1 at noon, Disp: , Rfl:  .  metoprolol tartrate (LOPRESSOR) 25 MG tablet, TAKE 1 TABLET(25 MG) BY MOUTH TWICE DAILY, Disp: 180 tablet, Rfl: 3 .  mirtazapine (REMERON) 7.5 MG tablet, Take 1 tablet (7.5 mg total) by mouth at bedtime., Disp: 30 tablet, Rfl: 3 .  Multiple Vitamin (MULTIVITAMIN WITH MINERALS) TABS, Take 1 tablet by mouth daily., Disp: , Rfl:  .  omeprazole (PRILOSEC) 20 MG capsule, Take 20 mg by mouth 2 (two) times daily. , Disp: , Rfl:  .  oxybutynin (DITROPAN) 5 MG tablet, Take 1 tablet by mouth at bedtime., Disp: , Rfl: 6 .  promethazine (PHENERGAN) 25 MG tablet, Take 25 mg by mouth 3 (three) times daily as needed for nausea or vomiting., Disp: , Rfl:  .  sucralfate (CARAFATE) 1 g tablet, Take 1 g by mouth 4 (four) times daily -  with meals and at bedtime., Disp: , Rfl:  .  SYMBICORT 160-4.5 MCG/ACT inhaler, Inhale 2 puffs into the lungs 2 (two) times daily. , Disp: , Rfl:  .  timolol (TIMOPTIC) 0.5 % ophthalmic solution, , Disp: , Rfl: 2 .  warfarin (COUMADIN) 5 MG tablet, Take 2.5-5 mg by mouth daily. Take 5 mg every Monday and Friday then 2.5 mg the rest of the week, Disp: , Rfl:   Current Facility-Administered Medications:  .  0.9 %  sodium chloride infusion, 500 mL, Intravenous, Continuous, Gatha Mayer, MD

## 2016-08-14 NOTE — Patient Instructions (Signed)
   No changes in your medicines today.    Follow up with [place] in late January 23th at 3:45pm.     I appreciate the opportunity to care for you. Silvano Rusk, MD, [facility]

## 2016-08-17 ENCOUNTER — Encounter: Payer: Self-pay | Admitting: Internal Medicine

## 2016-09-16 ENCOUNTER — Other Ambulatory Visit: Payer: Self-pay | Admitting: Internal Medicine

## 2016-09-16 NOTE — Telephone Encounter (Signed)
Please advise Sir, thank you. 

## 2016-09-16 NOTE — Telephone Encounter (Signed)
Refill x 12 Aware she is elderly

## 2016-09-26 ENCOUNTER — Other Ambulatory Visit: Payer: Self-pay | Admitting: Cardiology

## 2016-09-29 NOTE — Telephone Encounter (Signed)
REFILL 

## 2016-10-03 ENCOUNTER — Other Ambulatory Visit: Payer: Self-pay | Admitting: Internal Medicine

## 2016-10-06 NOTE — Telephone Encounter (Signed)
May I refill Sir, thank you. 

## 2016-10-06 NOTE — Telephone Encounter (Signed)
Refill x 12 

## 2016-10-12 ENCOUNTER — Ambulatory Visit (INDEPENDENT_AMBULATORY_CARE_PROVIDER_SITE_OTHER): Payer: Medicare Other | Admitting: Podiatry

## 2016-10-12 DIAGNOSIS — G629 Polyneuropathy, unspecified: Secondary | ICD-10-CM

## 2016-10-12 DIAGNOSIS — M79676 Pain in unspecified toe(s): Secondary | ICD-10-CM | POA: Diagnosis not present

## 2016-10-12 DIAGNOSIS — B351 Tinea unguium: Secondary | ICD-10-CM | POA: Diagnosis not present

## 2016-10-12 NOTE — Progress Notes (Signed)
Patient ID: [name] [name], female   DOB: 09/03/1930, 83 y.o.   MRN: [id number]  Subjective: 83 y.o.-year-old female returns the office today for painful, elongated, thickened toenails which she is unable to trim herself. Denies any redness or drainage around the nails. Denies any acute changes since last appointment and no new complaints today. Denies any systemic complaints such as fevers, chills, nausea, vomiting.   Objective: AAO 3, NAD; presents with her husband DP/PT pulses 1/4 bilaterally, CRT less than 3 seconds Protective sensation decreased with Simms Weinstein monofilament Nails hypertrophic, dystrophic, elongated, brittle, discolored 10. There is tenderness overlying the nails 1-5 bilaterally. There is no surrounding erythema or drainage along the nail sites. No open lesions or pre-ulcerative lesions are identified. No other areas of tenderness bilateral lower extremities. No overlying edema, erythema, increased warmth. No pain with calf compression, swelling, warmth, erythema.  Assessment: Patient presents with symptomatic onychomycosis  Plan: -Treatment options including alternatives, risks, complications were discussed -Nails sharply debrided 10 without complication/bleeding. -Discussed daily foot inspection. If there are any changes, to call the office immediately.  -Follow-up in 3 months or sooner if any problems are to arise. In the meantime, encouraged to call the office with any questions, concerns, changes symptoms.  Mariesha Venturella, DPM  

## 2016-10-20 ENCOUNTER — Other Ambulatory Visit (HOSPITAL_COMMUNITY): Payer: Self-pay | Admitting: *Deleted

## 2016-10-21 ENCOUNTER — Inpatient Hospital Stay (HOSPITAL_COMMUNITY): Admission: RE | Admit: 2016-10-21 | Payer: Medicare Other | Source: Ambulatory Visit

## 2016-10-27 ENCOUNTER — Encounter: Payer: Self-pay | Admitting: Internal Medicine

## 2016-10-27 ENCOUNTER — Ambulatory Visit (INDEPENDENT_AMBULATORY_CARE_PROVIDER_SITE_OTHER): Payer: Medicare Other | Admitting: Internal Medicine

## 2016-10-27 ENCOUNTER — Ambulatory Visit (HOSPITAL_COMMUNITY)
Admission: RE | Admit: 2016-10-27 | Discharge: 2016-10-27 | Disposition: A | Discharge status: Home / Self Care | Payer: Medicare Other | Source: Ambulatory Visit | Attending: Internal Medicine | Admitting: Internal Medicine

## 2016-10-27 VITALS — BP 120/80 | HR 78 | Ht 60.0 in | Wt 145.4 lb

## 2016-10-27 DIAGNOSIS — R110 Nausea: Secondary | ICD-10-CM | POA: Diagnosis not present

## 2016-10-27 DIAGNOSIS — M810 Age-related osteoporosis without current pathological fracture: Secondary | ICD-10-CM | POA: Insufficient documentation

## 2016-10-27 DIAGNOSIS — K580 Irritable bowel syndrome with diarrhea: Secondary | ICD-10-CM

## 2016-10-27 MED ORDER — DENOSUMAB 60 MG/ML ~~LOC~~ SOLN
60.0000 mg | Freq: Once | SUBCUTANEOUS | Status: AC
  Administered 2016-10-27: 60 mg via SUBCUTANEOUS
  Filled 2016-10-27: qty 1

## 2016-10-27 NOTE — Progress Notes (Signed)
   [name] [name] 83 y.o. 05/10/1930 [id number]  Assessment & Plan:   Encounter Diagnoses  Name Primary?  . Chronic nausea Yes  . Irritable bowel syndrome with diarrhea     Overall much better. Prefers to schedule a f/u and cancel if ok - so will schedule late March.  Susa Griffins, MD   Subjective:   Chief Complaint: f/u nausea and IBS  HPI She is here with husband and reports that she is soing well overall - thinks oxybutnin has helped with urinary incontinence a great deal and not having fecal incontinence or sig nausea either. Not asking for any change in tx.  Medications, allergies, past medical history, past surgical history, family history and social history are reviewed and updated in the EMR.  Review of Systems As above  Objective:   Physical Exam BP 120/80   Pulse 78   Ht 5' (1.524 m)   Wt 145 lb 6.4 oz (66 kg)   SpO2 98%   BMI 28.40 kg/m  NAD

## 2016-10-27 NOTE — Patient Instructions (Signed)
   Glad your doing well.   Follow up with Dr Carlean Purl on March 29th at 3:45PM, if your better you may cancel this appointment.     I appreciate the opportunity to care for you. Silvano Rusk, MD, [facility]

## 2016-12-31 ENCOUNTER — Ambulatory Visit: Payer: Medicare Other | Admitting: Internal Medicine

## 2017-01-11 ENCOUNTER — Ambulatory Visit (INDEPENDENT_AMBULATORY_CARE_PROVIDER_SITE_OTHER): Payer: Medicare Other | Admitting: Podiatry

## 2017-01-11 DIAGNOSIS — G629 Polyneuropathy, unspecified: Secondary | ICD-10-CM

## 2017-01-11 DIAGNOSIS — B351 Tinea unguium: Secondary | ICD-10-CM

## 2017-01-11 DIAGNOSIS — M79676 Pain in unspecified toe(s): Secondary | ICD-10-CM | POA: Diagnosis not present

## 2017-01-13 NOTE — Progress Notes (Signed)
Patient ID: [name] [name], female   DOB: 12/09/1929, 83 y.o.   MRN: [id number]  Subjective: 83 y.o.-year-old female returns the office today for painful, elongated, thickened toenails which she is unable to trim herself. Denies any redness or drainage around the nails. Denies any acute changes since last appointment and no new complaints today. Denies any systemic complaints such as fevers, chills, nausea, vomiting.   Objective: AAO 3, NAD; presents with her husband DP/PT pulses 1/4 bilaterally, CRT less than 3 seconds Protective sensation decreased with Simms Weinstein monofilament Nails hypertrophic, dystrophic, elongated, brittle, discolored 10. There is tenderness overlying the nails 1-5 bilaterally. There is no surrounding erythema or drainage along the nail sites. No open lesions or pre-ulcerative lesions are identified. No other areas of tenderness bilateral lower extremities. No overlying edema, erythema, increased warmth. No pain with calf compression, swelling, warmth, erythema.  Assessment: Patient presents with symptomatic onychomycosis  Plan: -Treatment options including alternatives, risks, complications were discussed -Nails sharply debrided 10 without complication/bleeding. -Discussed daily foot inspection. If there are any changes, to call the office immediately.  -Follow-up in 3 months or sooner if any problems are to arise. In the meantime, encouraged to call the office with any questions, concerns, changes symptoms.  Celesta Gentile, DPM

## 2017-04-15 ENCOUNTER — Encounter: Payer: Self-pay | Admitting: Podiatry

## 2017-04-15 ENCOUNTER — Ambulatory Visit (INDEPENDENT_AMBULATORY_CARE_PROVIDER_SITE_OTHER): Payer: Medicare Other | Admitting: Podiatry

## 2017-04-15 DIAGNOSIS — M79676 Pain in unspecified toe(s): Secondary | ICD-10-CM

## 2017-04-15 DIAGNOSIS — B351 Tinea unguium: Secondary | ICD-10-CM

## 2017-04-15 DIAGNOSIS — G629 Polyneuropathy, unspecified: Secondary | ICD-10-CM

## 2017-04-16 ENCOUNTER — Telehealth: Payer: Self-pay | Admitting: Podiatry

## 2017-04-16 NOTE — Telephone Encounter (Signed)
Yes, I'm calling for my wife who saw Dr. Jacqualyn Posey yesterday. I've gone over her medication list and would like to make a couple of changes to get it update. If you could, please call me back at [phone].

## 2017-04-18 NOTE — Progress Notes (Signed)
Patient ID: [name] [name], female   DOB: 1930-06-11, 83 y.o.   MRN: [id number]  Subjective: 83 y.o.-year-old female returns the office today for painful, elongated, thickened toenails which she is unable to trim herself. Denies any redness or drainage around the nails. Denies any acute changes since last appointment and no new complaints today. Denies any systemic complaints such as fevers, chills, nausea, vomiting.   Objective: AAO 3, NAD; presents with her husband DP/PT pulses 1/4 bilaterally, CRT less than 3 seconds Protective sensation decreased with Simms Weinstein monofilament Nails hypertrophic, dystrophic, elongated, brittle, discolored 10. There is tenderness overlying the nails 1-5 bilaterally. There is no surrounding erythema or drainage along the nail sites. No open lesions or pre-ulcerative lesions are identified. No other areas of tenderness bilateral lower extremities. No overlying edema, erythema, increased warmth. No pain with calf compression, swelling, warmth, erythema.  Assessment: Patient presents with symptomatic onychomycosis  Plan: -Treatment options including alternatives, risks, complications were discussed -Nails sharply debrided 10 without complication/bleeding. -Discussed daily foot inspection. If there are any changes, to call the office immediately.  -Follow-up in 3 months or sooner if any problems are to arise. In the meantime, encouraged to call the office with any questions, concerns, changes symptoms.  Celesta Gentile, DPM

## 2017-04-22 NOTE — Telephone Encounter (Signed)
Spoke with patient's husband [name]) and he just wanted [place] to be aware of the medicines that the patient was not taken and I took care of the medicines. Lattie Haw

## 2017-05-28 ENCOUNTER — Encounter: Payer: Self-pay | Admitting: Cardiology

## 2017-06-16 NOTE — Progress Notes (Signed)
HPI The patient presents for evaluation of atrial fibrillation.  Since I last saw her she's had no new cardiovascular complaints. She complains of right leg pain that keeps her up at night. She's not describing any swelling. She doesn't notice any palpitations, presyncope or syncope.  She doesn't have any chest pressure, neck or arm discomfort. She has no shortness of breath, PND or orthopnea. She has no weight gain or edema. She's not had any problems with her warfarin. She does walk with a walker because of decreased balance but she hasn't had any falls area  Allergies  Allergen Reactions  . Lactose Intolerance (Gi) Nausea And Vomiting    severe stomach pain  . Penicillins Hives and Itching    "haven't used any since 1970's" "haven't used any since 1970's"  . Sulfa Antibiotics Hives and Itching    "haven't used any since 1970's"  . Ciprofloxacin Swelling  . Sulfasalazine Hives and Itching    "haven't used any since 1970's"    Current Outpatient Prescriptions  Medication Sig Dispense Refill  . dicyclomine (BENTYL) 20 MG tablet Every AM and then every 6 hrs as needed (Patient taking differently: 10 mg. Every AM and then every 6 hrs as needed) 90 tablet 4  . dicyclomine (BENTYL) 20 MG tablet TAKE 1/2 TABLET BY MOUTH EVERY 6 HOURS AS NEEDED FOR SPASMS(ABDOMINAL PAIN) 90 tablet 11  . DILT-XR 120 MG 24 hr capsule TAKE 1 CAPSULE(120 MG) BY MOUTH DAILY 60 capsule 11  . gabapentin (NEURONTIN) 100 MG capsule Take 1 capsule by mouth 2 (two) times daily.    Marland Kitchen glimepiride (AMARYL) 4 MG tablet Take 4 mg by mouth daily with breakfast.     . HYDROcodone-acetaminophen (NORCO/VICODIN) 5-325 MG per tablet Take 1 tablet by mouth every 6 (six) hours as needed.     Marland Kitchen LANTUS SOLOSTAR 100 UNIT/ML Solostar Pen Inject 22 Units into the skin daily. Patient's husband [name] stated that the patient only takes 22 units instead of the 28 units. [place] Mon CMA    . latanoprost (XALATAN) 0.005 % ophthalmic solution  Place 1 drop into both eyes at bedtime.    Marland Kitchen loperamide (LOPERAMIDE A-D) 2 MG tablet Take 1 tablet (2 mg total) by mouth as needed for diarrhea or loose stools (use at bedtime). 30 tablet 0  . LORazepam (ATIVAN) 1 MG tablet Take 1 tab 3 times a day    . methylphenidate (RITALIN) 5 MG tablet take 1 in the morning and 1 at noon    . metoprolol tartrate (LOPRESSOR) 25 MG tablet TAKE 1 TABLET(25 MG) BY MOUTH TWICE DAILY 180 tablet 3  . mirtazapine (REMERON) 7.5 MG tablet TAKE 1 TABLET(7.5 MG) BY MOUTH AT BEDTIME 30 tablet 11  . Multiple Vitamin (MULTIVITAMIN WITH MINERALS) TABS Take 1 tablet by mouth daily.    Marland Kitchen omeprazole (PRILOSEC) 20 MG capsule Take 20 mg by mouth 2 (two) times daily.     Marland Kitchen oxybutynin (DITROPAN) 5 MG tablet Take 1 tablet by mouth at bedtime.  6  . promethazine (PHENERGAN) 25 MG tablet Take 25 mg by mouth 3 (three) times daily as needed for nausea or vomiting.    . SYMBICORT 160-4.5 MCG/ACT inhaler Inhale 2 puffs into the lungs 2 (two) times daily.     . timolol (TIMOPTIC) 0.5 % ophthalmic solution   2  . warfarin (COUMADIN) 5 MG tablet Take 2.5-5 mg by mouth daily. Take 5 mg every Monday and Friday then 2.5 mg the rest  of the week      Past Medical History:  Diagnosis Date  . Atrial fibrillation (HCC)    RVR hx.  Chronic Coumadin  . C. difficile colitis 09/21/2013, 11/2013  . Carotid stenosis    a. Carotid US (10/2013):  bilat 1-39%; f/u 1 year  . Chronic lower back pain   . COPD (chronic obstructive pulmonary disease) ([place])   . Depression   . GERD (gastroesophageal reflux disease)    with HH  . Glaucoma   . HCAP (healthcare-associated pneumonia) 11/21/2011  . High cholesterol   . Hypertension   . Metabolic encephalopathy 4/0/9735  . Mitral regurgitation 03/11/2012  . Pancreatitis    ~2008, 11/2011 post ERCP  . Pneumonia 12/2011   "first time I know about"  . Sepsis ([place]) 11/27/2013  . SIRS (systemic inflammatory response syndrome) ([place]) 03/13/2012   a/w post ERCP   pancreatitis.   . Type II diabetes mellitus ([place])     Past Surgical History:  Procedure Laterality Date  . APPENDECTOMY  ~ 1960  . CATARACT EXTRACTION W/ INTRAOCULAR LENS  IMPLANT, BILATERAL  2000's  . CHOLECYSTECTOMY  1990's   Lap chole  . COLONOSCOPY  01/2003   diverticulosis, 39mm hyperplastic sigmoid polyp  . DILATION AND CURETTAGE OF UTERUS    . ERCP  11/13/2011   Procedure: ENDOSCOPIC RETROGRADE CHOLANGIOPANCREATOGRAPHY (ERCP);  Surgeon: Beryle Beams, MD;  Location: [facility] ENDOSCOPY;  Service: Endoscopy;  Laterality: N/A;  . FLEXIBLE SIGMOIDOSCOPY N/A 05/02/2014   Procedure: FLEXIBLE SIGMOIDOSCOPY;  Surgeon: Gatha Mayer, MD;  Location: [facility] ENDOSCOPY;  Service: Endoscopy;  Laterality: N/A;  . TONSILLECTOMY AND ADENOIDECTOMY  ~ 1939  . VAGINAL HYSTERECTOMY  ~ 1960's    ROS:  As stated in the HPI and negative for all other systems.   PHYSICAL EXAM BP 137/81   Pulse 87   Ht 5' (1.524 m)   Wt 143 lb 12.8 oz (65.2 kg)   BMI 28.08 kg/m   GENERAL:  Well appearing NECK:  No jugular venous distention, waveform within normal limits, carotid upstroke brisk and symmetric, no bruits, no thyromegaly LUNGS:  Clear to auscultation bilaterally CHEST:  Unremarkable HEART:  PMI not displaced or sustained,S1 and S2 within normal limits, no S3, no clicks, no rubs, no murmurs, irregular ABD:  Flat, positive bowel sounds normal in frequency in pitch, no bruits, no rebound, no guarding, no midline pulsatile mass, no hepatomegaly, no splenomegaly EXT:  2 plus pulses throughout, no edema, no cyanosis no clubbing   EKG:  Atrial fibrillation, rate 87, axis within normal limits, intervals within normal limits, no acute ST-T wave changes lower voltage in limb leads. 06/17/2017   ASSESSMENT AND PLAN   Atrial fibrillation - She tolerates anticoagulation and has no symptoms in this rhythm.  No change in therapy is indicated.   Ms. Jarelly Rinck has a CHA2DS2 - VASc score of 4 with a risk of stroke  of 4%.  HTN (hypertension) -  The blood pressure is at target continue current meds.    Mitral regurgitation -  This was mild on echo in 2015.  I would not suspect that this was different clinically.  No change in therapy is indicated and no repeat testing at this point.   Carotid stenosis - This was mild in 2016.  I will not repeat testing at this point.  I reviewed this with them.

## 2017-06-17 ENCOUNTER — Ambulatory Visit (INDEPENDENT_AMBULATORY_CARE_PROVIDER_SITE_OTHER): Payer: Medicare Other | Admitting: Cardiology

## 2017-06-17 ENCOUNTER — Encounter: Payer: Self-pay | Admitting: Cardiology

## 2017-06-17 VITALS — BP 137/81 | HR 87 | Ht 60.0 in | Wt 143.8 lb

## 2017-06-17 DIAGNOSIS — I10 Essential (primary) hypertension: Secondary | ICD-10-CM | POA: Diagnosis not present

## 2017-06-17 DIAGNOSIS — I482 Chronic atrial fibrillation, unspecified: Secondary | ICD-10-CM

## 2017-06-17 NOTE — Patient Instructions (Signed)
Medication Instructions:  Continue current medications  If you need a refill on your cardiac medications before your next appointment, please call your pharmacy.  Labwork: None Ordered   Testing/Procedures: None Ordered  Follow-Up: Your physician wants you to follow-up in: 1 Year. You should receive a reminder letter in the mail two months in advance. If you do not receive a letter, please call our office [phone].    Thank you for choosing [facility]!!      

## 2017-07-22 ENCOUNTER — Encounter: Payer: Self-pay | Admitting: Podiatry

## 2017-07-22 ENCOUNTER — Ambulatory Visit (INDEPENDENT_AMBULATORY_CARE_PROVIDER_SITE_OTHER): Payer: Medicare Other | Admitting: Podiatry

## 2017-07-22 DIAGNOSIS — M79676 Pain in unspecified toe(s): Secondary | ICD-10-CM | POA: Diagnosis not present

## 2017-07-22 DIAGNOSIS — B351 Tinea unguium: Secondary | ICD-10-CM | POA: Diagnosis not present

## 2017-07-25 NOTE — Progress Notes (Signed)
Patient ID: Monicia Tse, female   DOB: 1930/03/16, 83 y.o.   MRN: [id number]  Subjective: 83 y.o.-year-old female returns the office today for painful, elongated, thickened toenails which she is unable to trim herself. Denies any redness or drainage around the nails. Denies any acute changes since last appointment and no new complaints today. Denies any systemic complaints such as fevers, chills, nausea, vomiting.   Objective: NAD; presents with her husband who also has no new concerns. DP/PT pulses 1/4 bilaterally, CRT less than 3 seconds Protective sensation decreased with Simms Weinstein monofilament Nails hypertrophic, dystrophic, elongated, brittle, discolored 10. There is tenderness overlying the nails 1-5 bilaterally. There is no surrounding erythema or drainage along the nail sites. No open lesions or pre-ulcerative lesions are identified. No other areas of tenderness bilateral lower extremities. No overlying edema, erythema, increased warmth. No pain with calf compression, swelling, warmth, erythema.  Assessment: Patient presents with symptomatic onychomycosis  Plan: -Treatment options including alternatives, risks, complications were discussed -Nails sharply debrided 10 without complication/bleeding. -Discussed daily foot inspection. If there are any changes, to call the office immediately.  -Follow-up in 3 months or sooner if any problems are to arise. In the meantime, encouraged to call the office with any questions, concerns, changes symptoms.  Celesta Gentile, DPM

## 2017-08-28 ENCOUNTER — Other Ambulatory Visit: Payer: Self-pay | Admitting: Cardiology

## 2017-10-08 ENCOUNTER — Telehealth: Payer: Self-pay

## 2017-10-11 NOTE — Telephone Encounter (Signed)
What are the ?'s  Or do I need to call him?

## 2017-10-11 NOTE — Telephone Encounter (Signed)
I spoke with her husband and he said everything is better and that they made an appointment to see Tye Savoy NP-C next week. I told him to bring all her medicines.  Last week she was having stomach pain/nausea and sleeping a lot.  He was concerned that she had taken her vicodin and gabapentin meds together.

## 2017-10-19 ENCOUNTER — Ambulatory Visit: Payer: Medicare Other | Admitting: Physician Assistant

## 2017-10-19 ENCOUNTER — Ambulatory Visit: Payer: Medicare Other | Admitting: Nurse Practitioner

## 2017-10-20 DIAGNOSIS — R5383 Other fatigue: Secondary | ICD-10-CM | POA: Diagnosis not present

## 2017-10-20 DIAGNOSIS — Z7901 Long term (current) use of anticoagulants: Secondary | ICD-10-CM | POA: Diagnosis not present

## 2017-10-20 DIAGNOSIS — F329 Major depressive disorder, single episode, unspecified: Secondary | ICD-10-CM | POA: Diagnosis not present

## 2017-10-20 DIAGNOSIS — F419 Anxiety disorder, unspecified: Secondary | ICD-10-CM | POA: Diagnosis not present

## 2017-10-20 DIAGNOSIS — Z7984 Long term (current) use of oral hypoglycemic drugs: Secondary | ICD-10-CM | POA: Diagnosis not present

## 2017-10-20 DIAGNOSIS — R197 Diarrhea, unspecified: Secondary | ICD-10-CM | POA: Diagnosis not present

## 2017-10-20 DIAGNOSIS — I10 Essential (primary) hypertension: Secondary | ICD-10-CM | POA: Diagnosis not present

## 2017-10-20 DIAGNOSIS — Z79899 Other long term (current) drug therapy: Secondary | ICD-10-CM | POA: Diagnosis not present

## 2017-10-20 DIAGNOSIS — E119 Type 2 diabetes mellitus without complications: Secondary | ICD-10-CM | POA: Diagnosis not present

## 2017-10-20 DIAGNOSIS — R350 Frequency of micturition: Secondary | ICD-10-CM | POA: Diagnosis not present

## 2017-10-20 DIAGNOSIS — J449 Chronic obstructive pulmonary disease, unspecified: Secondary | ICD-10-CM | POA: Diagnosis not present

## 2017-10-20 DIAGNOSIS — R1110 Vomiting, unspecified: Secondary | ICD-10-CM | POA: Diagnosis not present

## 2017-10-20 DIAGNOSIS — N390 Urinary tract infection, site not specified: Secondary | ICD-10-CM | POA: Diagnosis not present

## 2017-10-20 DIAGNOSIS — K2970 Gastritis, unspecified, without bleeding: Secondary | ICD-10-CM | POA: Diagnosis not present

## 2017-10-20 DIAGNOSIS — E876 Hypokalemia: Secondary | ICD-10-CM | POA: Diagnosis not present

## 2017-10-20 DIAGNOSIS — N3000 Acute cystitis without hematuria: Secondary | ICD-10-CM | POA: Diagnosis not present

## 2017-10-20 DIAGNOSIS — I482 Chronic atrial fibrillation: Secondary | ICD-10-CM | POA: Diagnosis not present

## 2017-10-20 DIAGNOSIS — R112 Nausea with vomiting, unspecified: Secondary | ICD-10-CM | POA: Diagnosis not present

## 2017-10-21 DIAGNOSIS — E109 Type 1 diabetes mellitus without complications: Secondary | ICD-10-CM | POA: Diagnosis not present

## 2017-10-22 ENCOUNTER — Ambulatory Visit: Payer: Self-pay | Admitting: Podiatry

## 2017-10-24 ENCOUNTER — Emergency Department (HOSPITAL_BASED_OUTPATIENT_CLINIC_OR_DEPARTMENT_OTHER): Payer: Medicare HMO

## 2017-10-24 ENCOUNTER — Emergency Department (HOSPITAL_BASED_OUTPATIENT_CLINIC_OR_DEPARTMENT_OTHER)
Admission: EM | Admit: 2017-10-24 | Discharge: 2017-10-25 | Disposition: A | Discharge status: Home / Self Care | Payer: Medicare HMO | Attending: Emergency Medicine | Admitting: Emergency Medicine

## 2017-10-24 ENCOUNTER — Encounter (HOSPITAL_BASED_OUTPATIENT_CLINIC_OR_DEPARTMENT_OTHER): Payer: Self-pay | Admitting: Emergency Medicine

## 2017-10-24 ENCOUNTER — Other Ambulatory Visit: Payer: Self-pay

## 2017-10-24 DIAGNOSIS — J449 Chronic obstructive pulmonary disease, unspecified: Secondary | ICD-10-CM | POA: Insufficient documentation

## 2017-10-24 DIAGNOSIS — R5383 Other fatigue: Secondary | ICD-10-CM | POA: Insufficient documentation

## 2017-10-24 DIAGNOSIS — Z7901 Long term (current) use of anticoagulants: Secondary | ICD-10-CM | POA: Diagnosis not present

## 2017-10-24 DIAGNOSIS — Z79899 Other long term (current) drug therapy: Secondary | ICD-10-CM | POA: Diagnosis not present

## 2017-10-24 DIAGNOSIS — I10 Essential (primary) hypertension: Secondary | ICD-10-CM | POA: Diagnosis not present

## 2017-10-24 DIAGNOSIS — R112 Nausea with vomiting, unspecified: Secondary | ICD-10-CM

## 2017-10-24 DIAGNOSIS — R197 Diarrhea, unspecified: Secondary | ICD-10-CM | POA: Diagnosis not present

## 2017-10-24 DIAGNOSIS — Z7984 Long term (current) use of oral hypoglycemic drugs: Secondary | ICD-10-CM | POA: Diagnosis not present

## 2017-10-24 DIAGNOSIS — N3000 Acute cystitis without hematuria: Secondary | ICD-10-CM | POA: Diagnosis not present

## 2017-10-24 DIAGNOSIS — R1110 Vomiting, unspecified: Secondary | ICD-10-CM | POA: Diagnosis not present

## 2017-10-24 DIAGNOSIS — E119 Type 2 diabetes mellitus without complications: Secondary | ICD-10-CM | POA: Insufficient documentation

## 2017-10-24 DIAGNOSIS — R350 Frequency of micturition: Secondary | ICD-10-CM | POA: Insufficient documentation

## 2017-10-24 LAB — COMPREHENSIVE METABOLIC PANEL
ALK PHOS: 69 U/L (ref 38–126)
ALT: 19 U/L (ref 14–54)
AST: 37 U/L (ref 15–41)
Albumin: 4 g/dL (ref 3.5–5.0)
Anion gap: 14 (ref 5–15)
BILIRUBIN TOTAL: 0.9 mg/dL (ref 0.3–1.2)
BUN: 21 mg/dL — AB (ref 6–20)
CALCIUM: 9.5 mg/dL (ref 8.9–10.3)
CO2: 21 mmol/L — ABNORMAL LOW (ref 22–32)
CREATININE: 0.94 mg/dL (ref 0.44–1.00)
Chloride: 104 mmol/L (ref 101–111)
GFR, EST NON AFRICAN AMERICAN: 54 mL/min — AB (ref >60)
Glucose, Bld: 149 mg/dL — ABNORMAL HIGH (ref 65–99)
Potassium: 3.2 mmol/L — ABNORMAL LOW (ref 3.5–5.1)
Sodium: 139 mmol/L (ref 135–145)
TOTAL PROTEIN: 8 g/dL (ref 6.5–8.1)

## 2017-10-24 LAB — CBC
HCT: 44.8 % (ref 36.0–46.0)
Hemoglobin: 15.6 g/dL — ABNORMAL HIGH (ref 12.0–15.0)
MCH: 31 pg (ref 26.0–34.0)
MCHC: 34.8 g/dL (ref 30.0–36.0)
MCV: 89.1 fL (ref 78.0–100.0)
PLATELETS: 282 10*3/uL (ref 150–400)
RBC: 5.03 MIL/uL (ref 3.87–5.11)
RDW: 13.1 % (ref 11.5–15.5)
WBC: 12.3 10*3/uL — ABNORMAL HIGH (ref 4.0–10.5)

## 2017-10-24 LAB — URINALYSIS, ROUTINE W REFLEX MICROSCOPIC
Bilirubin Urine: NEGATIVE
GLUCOSE, UA: NEGATIVE mg/dL
KETONES UR: 15 mg/dL — AB
NITRITE: NEGATIVE
PROTEIN: 100 mg/dL — AB
Specific Gravity, Urine: 1.025 (ref 1.005–1.030)
pH: 6.5 (ref 5.0–8.0)

## 2017-10-24 LAB — TROPONIN I

## 2017-10-24 LAB — URINALYSIS, MICROSCOPIC (REFLEX)

## 2017-10-24 LAB — PROTIME-INR
INR: 1.71
Prothrombin Time: 19.9 seconds — ABNORMAL HIGH (ref 11.4–15.2)

## 2017-10-24 LAB — LIPASE, BLOOD: Lipase: 25 U/L (ref 11–51)

## 2017-10-24 MED ORDER — NITROFURANTOIN MONOHYD MACRO 100 MG PO CAPS: 100.0000 mg | ORAL_CAPSULE | Freq: Two times a day (BID) | ORAL | 0 refills | Status: DC

## 2017-10-24 MED ORDER — ONDANSETRON HCL 4 MG/2ML IJ SOLN
4.0000 mg | Freq: Once | INTRAMUSCULAR | Status: DC
  Filled 2017-10-24: qty 2

## 2017-10-24 MED ORDER — POTASSIUM CHLORIDE CRYS ER 20 MEQ PO TBCR
40.0000 meq | EXTENDED_RELEASE_TABLET | Freq: Once | ORAL | Status: AC
Start: 2017-10-24 — End: 2017-10-24
  Administered 2017-10-24: 40 meq via ORAL
  Filled 2017-10-24: qty 2

## 2017-10-24 MED ORDER — SODIUM CHLORIDE 0.9 % IV BOLUS (SEPSIS)
500.0000 mL | Freq: Once | INTRAVENOUS | Status: AC
  Administered 2017-10-24: 500 mL via INTRAVENOUS

## 2017-10-24 MED ORDER — NITROFURANTOIN MONOHYD MACRO 100 MG PO CAPS
100.0000 mg | ORAL_CAPSULE | Freq: Once | ORAL | Status: AC
  Administered 2017-10-24: 100 mg via ORAL
  Filled 2017-10-24: qty 1

## 2017-10-24 NOTE — ED Notes (Signed)
Pt declined zofran at this time. Denies nausea

## 2017-10-24 NOTE — ED Notes (Signed)
Pt given PO challenge per Dr. Tamera Punt and okay to take her home meds

## 2017-10-24 NOTE — ED Notes (Signed)
ED Provider at bedside. 

## 2017-10-24 NOTE — ED Notes (Signed)
Patient transported to X-ray 

## 2017-10-24 NOTE — ED Provider Notes (Signed)
[facility] DEPARTMENT Provider Note   CSN: [id number] Arrival date & time: 10/24/17  1813     History   Chief Complaint Chief Complaint  Patient presents with  . Emesis    HPI [name] [name] is a 84 y.o. female.  Patient is a 84 year old female with a history of atrial fibrillation on Coumadin, COPD, gastroesophageal reflux disease, hypertension, hyperlipidemia, prior episode of pancreatitis in 2008 with a recurrence in 2013.  She also has chronic recurrent nausea and irritable bowel syndrome.  She does have a prior history of C. difficile colitis.  Her husband states that she has chronic nausea which waxes and wanes in intensity.  Over the last 3 days she has had more significant nausea with vomiting.  She has not really been able to keep much down.  She denies any abdominal pain.  She does have some urinary frequency which she has had in the past but no burning on urination.  She has had some confusion, more at night which her husband says is been going on for "awhile" and also waxes and wanes in intensity.  She denies any cough or congestion.  No fevers.  She does have some loose stool which is not unusual for her.  No bloody stools.  No headache.  No dizziness.  No increased difficulty with ambulation.  She does use a walker.  She has been using her antiemetics at home without improvement in symptoms.      Past Medical History:  Diagnosis Date  . Atrial fibrillation (HCC)    RVR hx.  Chronic Coumadin  . C. difficile colitis 09/21/2013, 11/2013  . Carotid stenosis    a. Carotid US (10/2013):  bilat 1-39%; f/u 1 year  . Chronic lower back pain   . COPD (chronic obstructive pulmonary disease) ([place])   . Depression   . GERD (gastroesophageal reflux disease)    with HH  . Glaucoma   . HCAP (healthcare-associated pneumonia) 11/21/2011  . High cholesterol   . Hypertension   . Metabolic encephalopathy 06/13/8337  . Mitral regurgitation 03/11/2012  .  Pancreatitis    ~2008, 11/2011 post ERCP  . Pneumonia 12/2011   "first time I know about"  . Sepsis ([place]) 11/27/2013  . SIRS (systemic inflammatory response syndrome) ([place]) 03/13/2012   a/w post ERCP  pancreatitis.   . Type II diabetes mellitus [facility])     Patient Active Problem List   Diagnosis Date Noted  . RLQ abdominal pain 04/24/2014  . Nausea alone 04/11/2014  . Irritable bowel syndrome 04/12/2012  . Chronic diarrhea 04/12/2012  . Encounter for long-term (current) use of anticoagulants 03/17/2012  . Depression 03/13/2012  . Anxiety 03/13/2012  . Mitral regurgitation 03/11/2012  . Chronic epigastric pain 01/25/2012  . Atrial fibrillation    . DM II (diabetes mellitus, type II), controlled ([place]) 11/13/2011  . HTN (hypertension) 11/13/2011  . Hypercholesteremia 11/13/2011  . Glaucoma (increased eye pressure) 11/13/2011    Past Surgical History:  Procedure Laterality Date  . APPENDECTOMY  ~ 1960  . CATARACT EXTRACTION W/ INTRAOCULAR LENS  IMPLANT, BILATERAL  2000's  . CHOLECYSTECTOMY  1990's   Lap chole  . COLONOSCOPY  01/2003   diverticulosis, 78mm hyperplastic sigmoid polyp  . DILATION AND CURETTAGE OF UTERUS    . ERCP  11/13/2011   Procedure: ENDOSCOPIC RETROGRADE CHOLANGIOPANCREATOGRAPHY (ERCP);  Surgeon: Beryle Beams, MD;  Location: [facility] ENDOSCOPY;  Service: Endoscopy;  Laterality: N/A;  . FLEXIBLE SIGMOIDOSCOPY N/A 05/02/2014   Procedure:  FLEXIBLE SIGMOIDOSCOPY;  Surgeon: Gatha Mayer, MD;  Location: [facility] ENDOSCOPY;  Service: Endoscopy;  Laterality: N/A;  . TONSILLECTOMY AND ADENOIDECTOMY  ~ 1939  . VAGINAL HYSTERECTOMY  ~ 1960's    OB History    No data available       Home Medications    Prior to Admission medications   Medication Sig Start Date End Date Taking? Authorizing Provider  dicyclomine (BENTYL) 20 MG tablet Every AM and then every 6 hrs as needed Patient taking differently: 10 mg. Every AM and then every 6 hrs as needed 02/05/15   Gatha Mayer, MD    dicyclomine (BENTYL) 20 MG tablet TAKE 1/2 TABLET BY MOUTH EVERY 6 HOURS AS NEEDED FOR SPASMS(ABDOMINAL PAIN) 09/16/16   Gatha Mayer, MD  DILT-XR 120 MG 24 hr capsule TAKE 1 CAPSULE(120 MG) BY MOUTH DAILY 09/29/16   Minus Breeding, MD  gabapentin (NEURONTIN) 100 MG capsule Take 1 capsule by mouth 2 (two) times daily. 04/06/15   [provider]  glimepiride (AMARYL) 4 MG tablet Take 4 mg by mouth daily with breakfast.     [provider]  HYDROcodone-acetaminophen (NORCO/VICODIN) 5-325 MG per tablet Take 1 tablet by mouth every 6 (six) hours as needed.  04/01/15   [provider]  LANTUS SOLOSTAR 100 UNIT/ML Solostar Pen Inject 22 Units into the skin daily. Patient's husband [name] stated that the patient only takes 22 units instead of the 28 units. [place] Mon CMA 09/01/13   [provider]  latanoprost (XALATAN) 0.005 % ophthalmic solution Place 1 drop into both eyes at bedtime.    [provider]  loperamide (LOPERAMIDE A-D) 2 MG tablet Take 1 tablet (2 mg total) by mouth as needed for diarrhea or loose stools (use at bedtime). 11/27/14   Gatha Mayer, MD  LORazepam (ATIVAN) 1 MG tablet Take 1 tab 3 times a day 06/27/14   [provider]  methylphenidate (RITALIN) 5 MG tablet take 1 in the morning and 1 at noon 06/27/14   [provider]  metoprolol tartrate (LOPRESSOR) 25 MG tablet TAKE 1 TABLET(25 MG) BY MOUTH TWICE DAILY 08/30/17   Minus Breeding, MD  mirtazapine (REMERON) 7.5 MG tablet TAKE 1 TABLET(7.5 MG) BY MOUTH AT BEDTIME 10/06/16   Gatha Mayer, MD  Multiple Vitamin (MULTIVITAMIN WITH MINERALS) TABS Take 1 tablet by mouth daily.    [provider]  nitrofurantoin, macrocrystal-monohydrate, (MACROBID) 100 MG capsule Take 1 capsule (100 mg total) by mouth 2 (two) times daily. X 7 days 10/24/17   Malvin Johns, MD  omeprazole (PRILOSEC) 20 MG capsule Take 20 mg by mouth 2 (two) times daily.     [provider]   oxybutynin (DITROPAN) 5 MG tablet Take 1 tablet by mouth at bedtime. 07/29/16   [provider]  promethazine (PHENERGAN) 25 MG tablet Take 25 mg by mouth 3 (three) times daily as needed for nausea or vomiting.    [provider]  SYMBICORT 160-4.5 MCG/ACT inhaler Inhale 2 puffs into the lungs 2 (two) times daily.  03/08/14   [provider]  timolol (TIMOPTIC) 0.5 % ophthalmic solution  06/02/15   [provider]  warfarin (COUMADIN) 5 MG tablet Take 2.5-5 mg by mouth daily. Take 5 mg every Monday and Friday then 2.5 mg the rest of the week    [provider]    Family History Family History  Problem Relation Age of Onset  . Stroke Father 40  .  Stroke Mother 24  . Anesthesia problems Neg Hx   . Hypotension Neg Hx   . Malignant hyperthermia Neg Hx   . Pseudochol deficiency Neg Hx   . Stomach cancer Neg Hx   . Esophageal cancer Neg Hx     Social History Social History   Tobacco Use  . Smoking status: Never Smoker  . Smokeless tobacco: Never Used  Substance Use Topics  . Alcohol use: No  . Drug use: No     Allergies   Lactose intolerance (gi); Penicillins; Sulfa antibiotics; Ciprofloxacin; and Sulfasalazine   Review of Systems Review of Systems  Constitutional: Positive for fatigue. Negative for chills, diaphoresis and fever.  HENT: Negative for congestion, rhinorrhea and sneezing.   Eyes: Negative.   Respiratory: Negative for cough, chest tightness and shortness of breath.   Cardiovascular: Negative for chest pain and leg swelling.  Gastrointestinal: Positive for diarrhea, nausea and vomiting. Negative for abdominal pain and blood in stool.  Genitourinary: Positive for frequency. Negative for difficulty urinating, flank pain and hematuria.  Musculoskeletal: Negative for arthralgias and back pain.  Skin: Negative for rash.  Neurological: Negative for dizziness, speech difficulty, weakness, numbness and headaches.      Physical Exam Updated Vital Signs BP (!) 147/92   Pulse 91   Temp 98.8 F (37.1 C) (Oral)   Resp 17   Ht 5\' 2"  (1.575 m)   Wt 65.8 kg (145 lb)   SpO2 98%   BMI 26.52 kg/m   Physical Exam  Constitutional: She is oriented to person, place, and time. She appears well-developed and well-nourished.  HENT:  Head: Normocephalic and atraumatic.  Eyes: Pupils are equal, round, and reactive to light.  Neck: Normal range of motion. Neck supple.  Cardiovascular: Normal rate and normal heart sounds. An irregularly irregular rhythm present.  Pulmonary/Chest: Effort normal and breath sounds normal. No respiratory distress. She has no wheezes. She has no rales. She exhibits no tenderness.  Abdominal: Soft. Bowel sounds are normal. There is no tenderness. There is no rebound and no guarding.  Musculoskeletal: Normal range of motion. She exhibits no edema.  Lymphadenopathy:    She has no cervical adenopathy.  Neurological: She is alert and oriented to person, place, and time.  Skin: Skin is warm and dry. No rash noted.  Psychiatric: She has a normal mood and affect.     ED Treatments / Results  Labs (all labs ordered are listed, but only abnormal results are displayed) Labs Reviewed  COMPREHENSIVE METABOLIC PANEL - Abnormal; Notable for the following components:      Result Value   Potassium 3.2 (*)    CO2 21 (*)    Glucose, Bld 149 (*)    BUN 21 (*)    GFR calc non Af Amer 54 (*)    All other components within normal limits  CBC - Abnormal; Notable for the following components:   WBC 12.3 (*)    Hemoglobin 15.6 (*)    All other components within normal limits  URINALYSIS, ROUTINE W REFLEX MICROSCOPIC - Abnormal; Notable for the following components:   APPearance CLOUDY (*)    Hgb urine dipstick TRACE (*)    Ketones, ur 15 (*)    Protein, ur 100 (*)    Leukocytes, UA SMALL (*)    All other components within normal limits  PROTIME-INR - Abnormal; Notable for the following  components:   Prothrombin Time 19.9 (*)    All other components within normal limits  URINALYSIS,  MICROSCOPIC (REFLEX) - Abnormal; Notable for the following components:   Bacteria, UA MANY (*)    Squamous Epithelial / LPF 0-5 (*)    Non Squamous Epithelial PRESENT (*)    All other components within normal limits  URINE CULTURE  LIPASE, BLOOD  TROPONIN I    EKG  EKG Interpretation  Date/Time:  Sunday October 24 2017 18:49:28 EST Ventricular Rate:  117 PR Interval:    QRS Duration: 82 QT Interval:  349 QTC Calculation: 489 R Axis:   44 Text Interpretation:  Atrial fibrillation Ventricular premature complex Anterior infarct, age indeterminate since last tracing no significant change Confirmed by Malvin Johns [phone]) on 10/24/2017 7:00:19 PM       Radiology Dg Abd Acute W/chest  Result Date: 10/24/2017 CLINICAL DATA:  Vomiting EXAM: DG ABDOMEN ACUTE W/ 1V CHEST COMPARISON:  CT abdomen pelvis 04/26/2014, chest radiograph 03/10/2014 FINDINGS: Single-view chest demonstrates no acute consolidation or effusion. Heart size within normal limits. Aortic atherosclerosis. Dense mitral calcification. Supine and upright views of the abdomen demonstrate no free air beneath the diaphragm. There are surgical clips in the right upper quadrant. Nonobstructed bowel-gas pattern. No abnormal calcifications. IMPRESSION: Nonobstructed bowel-gas pattern.  No acute cardiopulmonary disease. Electronically Signed   By: Donavan Foil M.D.   On: 10/24/2017 22:35    Procedures Procedures (including critical care time)  Medications Ordered in ED Medications  ondansetron (ZOFRAN) injection 4 mg (not administered)  sodium chloride 0.9 % bolus 500 mL (0 mLs Intravenous Stopped 10/24/17 2103)  potassium chloride SA (K-DUR,KLOR-CON) CR tablet 40 mEq (40 mEq Oral Given 10/24/17 2321)  nitrofurantoin (macrocrystal-monohydrate) (MACROBID) capsule 100 mg (100 mg Oral Given 10/24/17 2320)     Initial Impression /  Assessment and Plan / ED Course  I have reviewed the triage vital signs and the nursing notes.  Pertinent labs & imaging results that were available during my care of the patient were reviewed by me and considered in my medical decision making (see chart for details).     Patient is a 84 year old female who has chronic issues with nausea and has had multiple evaluations by her PCP and gastroenterology.  She presents today with some worsening symptoms and vomiting.  She has no abdominal pain.  There is no evidence of obstruction on imaging.  I do not feel this point that she needs CT imaging.  She had no vomiting in the emergency room.  She is at her baseline mental status.  There is no vertigo symptoms.  She is neurologically intact.  She is alert and oriented to surroundings.  She has nausea medicine at home to use.  She denies any for any nausea medicine here.  Her labs are non-concerning.  She had a slightly low potassium level which was replaced.  She does not have any cardiac sounding symptoms and her EKG does not show ischemia.  Her troponin is negative.  She is in atrial fibrillation which she has had in the past.  She cannot feel when she flips in and out of A. fib.  She is on Coumadin.  Her INR is slightly subtherapeutic.  She actually has an appointment on Tuesday with her PCP to have her INR rechecked.  She is tolerating oral fluids here.  She feels like she is ready to go home.  She does have evidence of urinary tract infection which may be what worsened her chronic symptoms.  She was treated with Macrobid given her multiple other allergies.  Her urine was sent  for culture.  She was encouraged to have close follow-up with her PCP.  Return precautions were given.  Final Clinical Impressions(s) / ED Diagnoses   Final diagnoses:  Non-intractable vomiting with nausea, unspecified vomiting type  Acute cystitis without hematuria    ED Discharge Orders        Ordered    nitrofurantoin,  macrocrystal-monohydrate, (MACROBID) 100 MG capsule  2 times daily     10/24/17 2335       Malvin Johns, MD 10/24/17 2344

## 2017-10-24 NOTE — ED Triage Notes (Signed)
Pt reports intermittent vomiting, loss of appetite, nausea x 2 weeks.

## 2017-10-25 NOTE — ED Notes (Signed)
Pt d/c home with her husband. Rx x 1 given for macrobid. Pt and spouse verbalize understanding for her to start antibiotics tomorrow and f/u with pcp this week. Family friend present to drive

## 2017-10-27 LAB — URINE CULTURE

## 2017-10-28 ENCOUNTER — Emergency Department (HOSPITAL_COMMUNITY): Payer: Medicare HMO

## 2017-10-28 ENCOUNTER — Encounter (HOSPITAL_COMMUNITY): Payer: Self-pay | Admitting: Emergency Medicine

## 2017-10-28 ENCOUNTER — Observation Stay (HOSPITAL_COMMUNITY)
Admission: EM | Admit: 2017-10-28 | Discharge: 2017-11-01 | Disposition: A | Discharge status: Home / Self Care | Payer: Medicare HMO | Attending: Internal Medicine | Admitting: Internal Medicine

## 2017-10-28 ENCOUNTER — Telehealth: Payer: Self-pay | Admitting: Emergency Medicine

## 2017-10-28 DIAGNOSIS — R42 Dizziness and giddiness: Secondary | ICD-10-CM | POA: Diagnosis not present

## 2017-10-28 DIAGNOSIS — E119 Type 2 diabetes mellitus without complications: Secondary | ICD-10-CM | POA: Diagnosis not present

## 2017-10-28 DIAGNOSIS — Z794 Long term (current) use of insulin: Secondary | ICD-10-CM | POA: Insufficient documentation

## 2017-10-28 DIAGNOSIS — I482 Chronic atrial fibrillation: Secondary | ICD-10-CM | POA: Diagnosis not present

## 2017-10-28 DIAGNOSIS — R109 Unspecified abdominal pain: Secondary | ICD-10-CM | POA: Diagnosis present

## 2017-10-28 DIAGNOSIS — R1011 Right upper quadrant pain: Secondary | ICD-10-CM

## 2017-10-28 DIAGNOSIS — Z90710 Acquired absence of both cervix and uterus: Secondary | ICD-10-CM | POA: Insufficient documentation

## 2017-10-28 DIAGNOSIS — R112 Nausea with vomiting, unspecified: Secondary | ICD-10-CM | POA: Diagnosis not present

## 2017-10-28 DIAGNOSIS — Z882 Allergy status to sulfonamides status: Secondary | ICD-10-CM | POA: Insufficient documentation

## 2017-10-28 DIAGNOSIS — R404 Transient alteration of awareness: Secondary | ICD-10-CM | POA: Diagnosis not present

## 2017-10-28 DIAGNOSIS — I4891 Unspecified atrial fibrillation: Secondary | ICD-10-CM | POA: Diagnosis not present

## 2017-10-28 DIAGNOSIS — F329 Major depressive disorder, single episode, unspecified: Secondary | ICD-10-CM | POA: Diagnosis not present

## 2017-10-28 DIAGNOSIS — N3000 Acute cystitis without hematuria: Secondary | ICD-10-CM

## 2017-10-28 DIAGNOSIS — Z9842 Cataract extraction status, left eye: Secondary | ICD-10-CM | POA: Diagnosis not present

## 2017-10-28 DIAGNOSIS — F419 Anxiety disorder, unspecified: Secondary | ICD-10-CM | POA: Diagnosis not present

## 2017-10-28 DIAGNOSIS — G8929 Other chronic pain: Secondary | ICD-10-CM | POA: Diagnosis not present

## 2017-10-28 DIAGNOSIS — N390 Urinary tract infection, site not specified: Secondary | ICD-10-CM | POA: Diagnosis not present

## 2017-10-28 DIAGNOSIS — Z9049 Acquired absence of other specified parts of digestive tract: Secondary | ICD-10-CM | POA: Insufficient documentation

## 2017-10-28 DIAGNOSIS — K2970 Gastritis, unspecified, without bleeding: Principal | ICD-10-CM | POA: Insufficient documentation

## 2017-10-28 DIAGNOSIS — M48061 Spinal stenosis, lumbar region without neurogenic claudication: Secondary | ICD-10-CM | POA: Insufficient documentation

## 2017-10-28 DIAGNOSIS — E876 Hypokalemia: Secondary | ICD-10-CM | POA: Insufficient documentation

## 2017-10-28 DIAGNOSIS — H409 Unspecified glaucoma: Secondary | ICD-10-CM | POA: Diagnosis not present

## 2017-10-28 DIAGNOSIS — I340 Nonrheumatic mitral (valve) insufficiency: Secondary | ICD-10-CM | POA: Insufficient documentation

## 2017-10-28 DIAGNOSIS — Z888 Allergy status to other drugs, medicaments and biological substances status: Secondary | ICD-10-CM | POA: Insufficient documentation

## 2017-10-28 DIAGNOSIS — K5730 Diverticulosis of large intestine without perforation or abscess without bleeding: Secondary | ICD-10-CM | POA: Insufficient documentation

## 2017-10-28 DIAGNOSIS — I700 Atherosclerosis of aorta: Secondary | ICD-10-CM | POA: Insufficient documentation

## 2017-10-28 DIAGNOSIS — I10 Essential (primary) hypertension: Secondary | ICD-10-CM | POA: Insufficient documentation

## 2017-10-28 DIAGNOSIS — N281 Cyst of kidney, acquired: Secondary | ICD-10-CM | POA: Insufficient documentation

## 2017-10-28 DIAGNOSIS — E7800 Pure hypercholesterolemia, unspecified: Secondary | ICD-10-CM | POA: Diagnosis not present

## 2017-10-28 DIAGNOSIS — R1110 Vomiting, unspecified: Secondary | ICD-10-CM | POA: Diagnosis not present

## 2017-10-28 DIAGNOSIS — R1013 Epigastric pain: Secondary | ICD-10-CM | POA: Insufficient documentation

## 2017-10-28 DIAGNOSIS — E739 Lactose intolerance, unspecified: Secondary | ICD-10-CM | POA: Insufficient documentation

## 2017-10-28 DIAGNOSIS — K589 Irritable bowel syndrome without diarrhea: Secondary | ICD-10-CM | POA: Insufficient documentation

## 2017-10-28 DIAGNOSIS — F32A Depression, unspecified: Secondary | ICD-10-CM | POA: Diagnosis present

## 2017-10-28 DIAGNOSIS — Z881 Allergy status to other antibiotic agents status: Secondary | ICD-10-CM | POA: Insufficient documentation

## 2017-10-28 DIAGNOSIS — Z79899 Other long term (current) drug therapy: Secondary | ICD-10-CM | POA: Insufficient documentation

## 2017-10-28 DIAGNOSIS — Z9841 Cataract extraction status, right eye: Secondary | ICD-10-CM | POA: Diagnosis not present

## 2017-10-28 DIAGNOSIS — Z515 Encounter for palliative care: Secondary | ICD-10-CM

## 2017-10-28 DIAGNOSIS — Z880 Allergy status to penicillin: Secondary | ICD-10-CM | POA: Diagnosis not present

## 2017-10-28 DIAGNOSIS — Z7901 Long term (current) use of anticoagulants: Secondary | ICD-10-CM | POA: Diagnosis not present

## 2017-10-28 DIAGNOSIS — M5136 Other intervertebral disc degeneration, lumbar region: Secondary | ICD-10-CM | POA: Insufficient documentation

## 2017-10-28 DIAGNOSIS — E785 Hyperlipidemia, unspecified: Secondary | ICD-10-CM | POA: Insufficient documentation

## 2017-10-28 DIAGNOSIS — J449 Chronic obstructive pulmonary disease, unspecified: Secondary | ICD-10-CM | POA: Diagnosis not present

## 2017-10-28 DIAGNOSIS — Z7189 Other specified counseling: Secondary | ICD-10-CM

## 2017-10-28 DIAGNOSIS — Z823 Family history of stroke: Secondary | ICD-10-CM | POA: Insufficient documentation

## 2017-10-28 DIAGNOSIS — Z8619 Personal history of other infectious and parasitic diseases: Secondary | ICD-10-CM | POA: Insufficient documentation

## 2017-10-28 DIAGNOSIS — K219 Gastro-esophageal reflux disease without esophagitis: Secondary | ICD-10-CM | POA: Insufficient documentation

## 2017-10-28 LAB — COMPREHENSIVE METABOLIC PANEL
ALT: 28 U/L (ref 14–54)
ANION GAP: 16 — AB (ref 5–15)
AST: 52 U/L — ABNORMAL HIGH (ref 15–41)
Albumin: 3.8 g/dL (ref 3.5–5.0)
Alkaline Phosphatase: 69 U/L (ref 38–126)
BUN: 12 mg/dL (ref 6–20)
CHLORIDE: 106 mmol/L (ref 101–111)
CO2: 14 mmol/L — ABNORMAL LOW (ref 22–32)
Calcium: 9.2 mg/dL (ref 8.9–10.3)
Creatinine, Ser: 0.87 mg/dL (ref 0.44–1.00)
GFR calc non Af Amer: 59 mL/min — ABNORMAL LOW (ref >60)
Glucose, Bld: 100 mg/dL — ABNORMAL HIGH (ref 65–99)
Potassium: 3.1 mmol/L — ABNORMAL LOW (ref 3.5–5.1)
Sodium: 136 mmol/L (ref 135–145)
Total Bilirubin: 1.2 mg/dL (ref 0.3–1.2)
Total Protein: 7.1 g/dL (ref 6.5–8.1)

## 2017-10-28 LAB — CBC
HCT: 45.8 % (ref 36.0–46.0)
HEMOGLOBIN: 16.1 g/dL — AB (ref 12.0–15.0)
MCH: 31.5 pg (ref 26.0–34.0)
MCHC: 35.2 g/dL (ref 30.0–36.0)
MCV: 89.6 fL (ref 78.0–100.0)
Platelets: 261 10*3/uL (ref 150–400)
RBC: 5.11 MIL/uL (ref 3.87–5.11)
RDW: 13 % (ref 11.5–15.5)
WBC: 12.5 10*3/uL — ABNORMAL HIGH (ref 4.0–10.5)

## 2017-10-28 LAB — I-STAT TROPONIN, ED
Troponin i, poc: 0.01 ng/mL (ref 0.00–0.08)
Troponin i, poc: 0.02 ng/mL (ref 0.00–0.08)

## 2017-10-28 LAB — CBG MONITORING, ED: GLUCOSE-CAPILLARY: 84 mg/dL (ref 65–99)

## 2017-10-28 LAB — URINALYSIS, ROUTINE W REFLEX MICROSCOPIC
Bacteria, UA: NONE SEEN
Bilirubin Urine: NEGATIVE
Glucose, UA: NEGATIVE mg/dL
HGB URINE DIPSTICK: NEGATIVE
KETONES UR: 20 mg/dL — AB
LEUKOCYTES UA: NEGATIVE
Nitrite: NEGATIVE
PROTEIN: 30 mg/dL — AB
RBC / HPF: NONE SEEN RBC/hpf (ref 0–5)
Specific Gravity, Urine: 1.029 (ref 1.005–1.030)
pH: 7 (ref 5.0–8.0)

## 2017-10-28 LAB — LIPASE, BLOOD: LIPASE: 33 U/L (ref 11–51)

## 2017-10-28 MED ORDER — INSULIN ASPART 100 UNIT/ML ~~LOC~~ SOLN
0.0000 [IU] | Freq: Three times a day (TID) | SUBCUTANEOUS | Status: DC
  Administered 2017-10-29 – 2017-10-30 (×2): 2 [IU] via SUBCUTANEOUS
  Administered 2017-10-31 – 2017-11-01 (×2): 1 [IU] via SUBCUTANEOUS

## 2017-10-28 MED ORDER — NITROFURANTOIN MONOHYD MACRO 100 MG PO CAPS
100.0000 mg | ORAL_CAPSULE | Freq: Two times a day (BID) | ORAL | Status: AC
  Administered 2017-10-29 (×3): 100 mg via ORAL
  Filled 2017-10-28 (×3): qty 1

## 2017-10-28 MED ORDER — STERILE WATER FOR INJECTION IJ SOLN
INTRAMUSCULAR | Status: AC
  Administered 2017-10-28
  Filled 2017-10-28: qty 10

## 2017-10-28 MED ORDER — SODIUM CHLORIDE 0.9 % IV BOLUS (SEPSIS)
500.0000 mL | Freq: Once | INTRAVENOUS | Status: AC
  Administered 2017-10-28: 500 mL via INTRAVENOUS

## 2017-10-28 MED ORDER — HYDRALAZINE HCL 20 MG/ML IJ SOLN
5.0000 mg | INTRAMUSCULAR | Status: DC | PRN
  Filled 2017-10-28: qty 1

## 2017-10-28 MED ORDER — MORPHINE SULFATE (PF) 4 MG/ML IV SOLN
2.0000 mg | Freq: Once | INTRAVENOUS | Status: AC
  Administered 2017-10-28: 2 mg via INTRAVENOUS
  Filled 2017-10-28: qty 1

## 2017-10-28 MED ORDER — ALBUTEROL SULFATE (2.5 MG/3ML) 0.083% IN NEBU: 2.5000 mg | INHALATION_SOLUTION | RESPIRATORY_TRACT | Status: DC | PRN

## 2017-10-28 MED ORDER — MORPHINE SULFATE (PF) 4 MG/ML IV SOLN
0.5000 mg | INTRAVENOUS | Status: DC | PRN
  Administered 2017-10-29: 0.52 mg via INTRAVENOUS
  Filled 2017-10-28: qty 1

## 2017-10-28 MED ORDER — TIMOLOL MALEATE 0.5 % OP SOLN
1.0000 [drp] | Freq: Every day | OPHTHALMIC | Status: DC
  Administered 2017-10-28 – 2017-11-01 (×5): 1 [drp] via OPHTHALMIC
  Filled 2017-10-28: qty 5

## 2017-10-28 MED ORDER — ACETAMINOPHEN 650 MG RE SUPP: 650.0000 mg | Freq: Four times a day (QID) | RECTAL | Status: DC | PRN

## 2017-10-28 MED ORDER — METHYLPHENIDATE HCL 5 MG PO TABS
5.0000 mg | ORAL_TABLET | Freq: Every day | ORAL | Status: DC
  Administered 2017-10-29 – 2017-11-01 (×4): 5 mg via ORAL
  Filled 2017-10-28 (×4): qty 1

## 2017-10-28 MED ORDER — LORAZEPAM 1 MG PO TABS
1.0000 mg | ORAL_TABLET | Freq: Three times a day (TID) | ORAL | Status: DC
  Administered 2017-10-28 – 2017-11-01 (×12): 1 mg via ORAL
  Filled 2017-10-28 (×12): qty 1

## 2017-10-28 MED ORDER — LOPERAMIDE HCL 2 MG PO CAPS: 2.0000 mg | ORAL_CAPSULE | ORAL | Status: DC | PRN

## 2017-10-28 MED ORDER — SODIUM CHLORIDE 0.9 % IV BOLUS (SEPSIS): 500.0000 mL | Freq: Once | INTRAVENOUS | Status: DC

## 2017-10-28 MED ORDER — LATANOPROST 0.005 % OP SOLN
1.0000 [drp] | Freq: Every day | OPHTHALMIC | Status: DC
Start: 2017-10-28 — End: 2017-11-01
  Administered 2017-10-29 – 2017-10-31 (×4): 1 [drp] via OPHTHALMIC
  Filled 2017-10-28 (×2): qty 2.5

## 2017-10-28 MED ORDER — DILTIAZEM HCL ER COATED BEADS 120 MG PO CP24
120.0000 mg | ORAL_CAPSULE | Freq: Every day | ORAL | Status: DC
  Administered 2017-10-28 – 2017-11-01 (×5): 120 mg via ORAL
  Filled 2017-10-28 (×6): qty 1

## 2017-10-28 MED ORDER — PANTOPRAZOLE SODIUM 40 MG IV SOLR
40.0000 mg | Freq: Once | INTRAVENOUS | Status: DC
  Filled 2017-10-28: qty 40

## 2017-10-28 MED ORDER — OXYBUTYNIN CHLORIDE 5 MG PO TABS
5.0000 mg | ORAL_TABLET | Freq: Every day | ORAL | Status: DC
  Administered 2017-10-28 – 2017-10-31 (×4): 5 mg via ORAL
  Filled 2017-10-28 (×4): qty 1

## 2017-10-28 MED ORDER — ACETAMINOPHEN 325 MG PO TABS: 650.0000 mg | ORAL_TABLET | Freq: Four times a day (QID) | ORAL | Status: DC | PRN

## 2017-10-28 MED ORDER — MIRTAZAPINE 15 MG PO TABS
7.5000 mg | ORAL_TABLET | Freq: Every day | ORAL | Status: DC
  Administered 2017-10-28 – 2017-10-31 (×4): 7.5 mg via ORAL
  Filled 2017-10-28 (×4): qty 1

## 2017-10-28 MED ORDER — ALUM & MAG HYDROXIDE-SIMETH 200-200-20 MG/5ML PO SUSP: 30.0000 mL | Freq: Four times a day (QID) | ORAL | Status: DC | PRN

## 2017-10-28 MED ORDER — DICYCLOMINE HCL 20 MG PO TABS
10.0000 mg | ORAL_TABLET | Freq: Three times a day (TID) | ORAL | Status: DC
  Administered 2017-10-29 – 2017-11-01 (×12): 10 mg via ORAL
  Filled 2017-10-28 (×12): qty 1

## 2017-10-28 MED ORDER — GI COCKTAIL ~~LOC~~
30.0000 mL | Freq: Once | ORAL | Status: AC
Start: 2017-10-28 — End: 2017-10-28
  Administered 2017-10-28: 30 mL via ORAL
  Filled 2017-10-28: qty 30

## 2017-10-28 MED ORDER — SODIUM CHLORIDE 0.9 % IV SOLN
INTRAVENOUS | Status: DC
  Administered 2017-10-29 (×2): via INTRAVENOUS

## 2017-10-28 MED ORDER — PANTOPRAZOLE SODIUM 40 MG IV SOLR
40.0000 mg | Freq: Two times a day (BID) | INTRAVENOUS | Status: DC
  Administered 2017-10-28 – 2017-10-30 (×5): 40 mg via INTRAVENOUS
  Filled 2017-10-28 (×5): qty 40

## 2017-10-28 MED ORDER — ZOLPIDEM TARTRATE 5 MG PO TABS
5.0000 mg | ORAL_TABLET | Freq: Every evening | ORAL | Status: DC | PRN
  Filled 2017-10-28: qty 1

## 2017-10-28 MED ORDER — MOMETASONE FURO-FORMOTEROL FUM 200-5 MCG/ACT IN AERO
2.0000 | INHALATION_SPRAY | Freq: Two times a day (BID) | RESPIRATORY_TRACT | Status: DC
  Administered 2017-10-29 – 2017-11-01 (×7): 2 via RESPIRATORY_TRACT
  Filled 2017-10-28: qty 8.8

## 2017-10-28 MED ORDER — ONDANSETRON HCL 4 MG/2ML IJ SOLN: 4.0000 mg | Freq: Three times a day (TID) | INTRAMUSCULAR | Status: DC | PRN

## 2017-10-28 MED ORDER — POTASSIUM CHLORIDE 20 MEQ/15ML (10%) PO SOLN
40.0000 meq | Freq: Once | ORAL | Status: AC
  Administered 2017-10-28: 40 meq via ORAL
  Filled 2017-10-28: qty 30

## 2017-10-28 MED ORDER — ADULT MULTIVITAMIN W/MINERALS CH
1.0000 | ORAL_TABLET | Freq: Every day | ORAL | Status: DC
  Administered 2017-10-28 – 2017-11-01 (×5): 1 via ORAL
  Filled 2017-10-28 (×5): qty 1

## 2017-10-28 MED ORDER — METOPROLOL TARTRATE 25 MG PO TABS
25.0000 mg | ORAL_TABLET | Freq: Two times a day (BID) | ORAL | Status: DC
  Administered 2017-10-28: 25 mg via ORAL
  Filled 2017-10-28: qty 1

## 2017-10-28 MED ORDER — SODIUM CHLORIDE 0.9 % IV BOLUS (SEPSIS)
1000.0000 mL | Freq: Once | INTRAVENOUS | Status: AC
  Administered 2017-10-28: 1000 mL via INTRAVENOUS

## 2017-10-28 MED ORDER — INSULIN GLARGINE 100 UNIT/ML ~~LOC~~ SOLN
15.0000 [IU] | Freq: Every day | SUBCUTANEOUS | Status: DC
  Administered 2017-10-28 – 2017-10-31 (×4): 15 [IU] via SUBCUTANEOUS
  Filled 2017-10-28 (×4): qty 0.15

## 2017-10-28 MED ORDER — GABAPENTIN 100 MG PO CAPS
100.0000 mg | ORAL_CAPSULE | Freq: Two times a day (BID) | ORAL | Status: DC
  Administered 2017-10-28 – 2017-11-01 (×8): 100 mg via ORAL
  Filled 2017-10-28 (×8): qty 1

## 2017-10-28 MED ORDER — ONDANSETRON HCL 4 MG/2ML IJ SOLN
4.0000 mg | Freq: Once | INTRAMUSCULAR | Status: AC
  Administered 2017-10-28: 4 mg via INTRAVENOUS
  Filled 2017-10-28: qty 2

## 2017-10-28 MED ORDER — HYDROCODONE-ACETAMINOPHEN 5-325 MG PO TABS
1.0000 | ORAL_TABLET | Freq: Four times a day (QID) | ORAL | Status: DC | PRN
  Administered 2017-10-28 – 2017-11-01 (×7): 1 via ORAL
  Filled 2017-10-28 (×7): qty 1

## 2017-10-28 MED ORDER — IOPAMIDOL (ISOVUE-300) INJECTION 61%
INTRAVENOUS | Status: AC
  Administered 2017-10-28: 100 mL
  Filled 2017-10-28: qty 100

## 2017-10-28 NOTE — ED Notes (Signed)
Got the patient undress on the monitor patient is resting with family at bedside and call bell in reach

## 2017-10-28 NOTE — ED Provider Notes (Signed)
Patient 84 year old female signed out from prior provider.  Patient came in today with terrible abdominal pain rating her back.  Went to her primary care and was sent here for further evaluation.  Patient has A. fib and is on Eliquis.  She reports that she had several days of nausea and vomiting.  Was seen earlier this week for the same.  Discharged home.  However she reports that it continues to cause her a lot of discomfort.  Patient initially calling out in pain.  Morphine helped her with her pain.  Patient's labs and CAT scan are pretty reassuring.  It is unclear what is causing this pain right now.  Will treat for gastritis.  Patient has been seen for chronic nausea the past by Dr. Carlean Purl.  Given patient's continued pain, age of 29 and a cardiac history.  Will admit for serial troponins, continue treatment for nausea. EKG non ischemic.    Macarthur Critchley, MD 10/28/17 [phone]

## 2017-10-28 NOTE — ED Provider Notes (Signed)
[place] EMERGENCY DEPARTMENT Provider Note   CSN: [id number] Arrival date & time: 10/28/17  1416     History   Chief Complaint Chief Complaint  Patient presents with  . Flank Pain    HPI [name] [name] is a 84 y.o. female with history of afib on warfarin, COPD, mitral regurgitation, T2DM, chronic nausea and vomiting and gastritis seen on EGD, presenting for abdominal pain.  She presents today with RUQ abdominal pain that has been worsening over the past 4 days. No associated vomiting or diarrhea, although she does take imodium daily. Normal BM. Poor appetite, not taking fluids well by mouth. Pain is worse with eating and drinking. She was previously seen on 1/20 at [facility] and treated with nitrofurantoin for a UTI.  Prior abdominal surgeries include appendectomy and cholecystectomy, as well as vaginal hysterectomy. She has T2DM and did receive her 22u lantus yesterday evening.  Last CT 2015: diverticulosis  Past Medical History:  Diagnosis Date  . Atrial fibrillation (HCC)    RVR hx.  Chronic Coumadin  . C. difficile colitis 09/21/2013, 11/2013  . Carotid stenosis    a. Carotid US (10/2013):  bilat 1-39%; f/u 1 year  . Chronic lower back pain   . COPD (chronic obstructive pulmonary disease) ([place])   . Depression   . GERD (gastroesophageal reflux disease)    with HH  . Glaucoma   . HCAP (healthcare-associated pneumonia) 11/21/2011  . High cholesterol   . Hypertension   . Metabolic encephalopathy 05/09/1323  . Mitral regurgitation 03/11/2012  . Pancreatitis    ~2008, 11/2011 post ERCP  . Pneumonia 12/2011   "first time I know about"  . Sepsis ([place]) 11/27/2013  . SIRS (systemic inflammatory response syndrome) ([place]) 03/13/2012   a/w post ERCP  pancreatitis.   . Type II diabetes mellitus [facility])     Patient Active Problem List   Diagnosis Date Noted  . RLQ abdominal pain 04/24/2014  . Nausea alone 04/11/2014  . Irritable bowel syndrome 04/12/2012  . Chronic  diarrhea 04/12/2012  . Encounter for long-term (current) use of anticoagulants 03/17/2012  . Depression 03/13/2012  . Anxiety 03/13/2012  . Mitral regurgitation 03/11/2012  . Chronic epigastric pain 01/25/2012  . Atrial fibrillation    . DM II (diabetes mellitus, type II), controlled ([place]) 11/13/2011  . HTN (hypertension) 11/13/2011  . Hypercholesteremia 11/13/2011  . Glaucoma (increased eye pressure) 11/13/2011    Past Surgical History:  Procedure Laterality Date  . APPENDECTOMY  ~ 1960  . CATARACT EXTRACTION W/ INTRAOCULAR LENS  IMPLANT, BILATERAL  2000's  . CHOLECYSTECTOMY  1990's   Lap chole  . COLONOSCOPY  01/2003   diverticulosis, 24mm hyperplastic sigmoid polyp  . DILATION AND CURETTAGE OF UTERUS    . ERCP  11/13/2011   Procedure: ENDOSCOPIC RETROGRADE CHOLANGIOPANCREATOGRAPHY (ERCP);  Surgeon: Beryle Beams, MD;  Location: [facility] ENDOSCOPY;  Service: Endoscopy;  Laterality: N/A;  . FLEXIBLE SIGMOIDOSCOPY N/A 05/02/2014   Procedure: FLEXIBLE SIGMOIDOSCOPY;  Surgeon: Gatha Mayer, MD;  Location: [facility] ENDOSCOPY;  Service: Endoscopy;  Laterality: N/A;  . TONSILLECTOMY AND ADENOIDECTOMY  ~ 1939  . VAGINAL HYSTERECTOMY  ~ 1960's    OB History    No data available       Home Medications    Prior to Admission medications   Medication Sig Start Date End Date Taking? Authorizing Provider  dicyclomine (BENTYL) 20 MG tablet Every AM and then every 6 hrs as needed Patient taking differently: Take 20 mg by  mouth daily.  02/05/15  Yes Gatha Mayer, MD  dicyclomine (BENTYL) 20 MG tablet TAKE 1/2 TABLET BY MOUTH EVERY 6 HOURS AS NEEDED FOR SPASMS(ABDOMINAL PAIN) 09/16/16  Yes Gatha Mayer, MD  DILT-XR 120 MG 24 hr capsule TAKE 1 CAPSULE(120 MG) BY MOUTH DAILY 09/29/16  Yes Minus Breeding, MD  gabapentin (NEURONTIN) 100 MG capsule Take 1 capsule by mouth 2 (two) times daily. 04/06/15  Yes [provider]  glimepiride (AMARYL) 4 MG tablet Take 4 mg by mouth daily with breakfast.     Yes [provider]  HYDROcodone-acetaminophen (NORCO/VICODIN) 5-325 MG per tablet Take 1 tablet by mouth every 6 (six) hours as needed.  04/01/15  Yes [provider]  LANTUS SOLOSTAR 100 UNIT/ML Solostar Pen Inject 22 Units into the skin daily.  09/01/13  Yes [provider]  latanoprost (XALATAN) 0.005 % ophthalmic solution Place 1 drop into both eyes at bedtime.   Yes [provider]  loperamide (LOPERAMIDE A-D) 2 MG tablet Take 1 tablet (2 mg total) by mouth as needed for diarrhea or loose stools (use at bedtime). 11/27/14  Yes Gatha Mayer, MD  LORazepam (ATIVAN) 1 MG tablet Take 1 mg by mouth 3 (three) times daily.  06/27/14  Yes [provider]  methylphenidate (RITALIN) 5 MG tablet Take 5 mg by mouth See admin instructions. take 5mg  in the morning daily and then 5mg  twice a day on every other day 06/27/14  Yes [provider]  metoprolol tartrate (LOPRESSOR) 25 MG tablet TAKE 1 TABLET(25 MG) BY MOUTH TWICE DAILY 08/30/17  Yes Minus Breeding, MD  mirtazapine (REMERON) 7.5 MG tablet TAKE 1 TABLET(7.5 MG) BY MOUTH AT BEDTIME 10/06/16  Yes Gatha Mayer, MD  Multiple Vitamin (MULTIVITAMIN WITH MINERALS) TABS Take 1 tablet by mouth daily.   Yes [provider]  nitrofurantoin, macrocrystal-monohydrate, (MACROBID) 100 MG capsule Take 1 capsule (100 mg total) by mouth 2 (two) times daily. X 7 days 10/24/17  Yes Malvin Johns, MD  omeprazole (PRILOSEC) 20 MG capsule Take 20 mg by mouth 2 (two) times daily.    Yes [provider]  oxybutynin (DITROPAN) 5 MG tablet Take 1 tablet by mouth at bedtime. 07/29/16  Yes [provider]  promethazine (PHENERGAN) 25 MG tablet Take 25 mg by mouth 3 (three) times daily as needed for nausea or vomiting.   Yes [provider]  SYMBICORT 160-4.5 MCG/ACT inhaler Inhale 2 puffs into the lungs 2 (two) times daily.  03/08/14  Yes [provider]  timolol (TIMOPTIC) 0.5 %  ophthalmic solution  06/02/15  Yes [provider]  warfarin (COUMADIN) 5 MG tablet Take 2.5-5 mg by mouth daily. Take 1.25mg  every Sun/Wed then 2.5 mg the rest of the week   Yes [provider]    Family History Family History  Problem Relation Age of Onset  . Stroke Father 45  . Stroke Mother 28  . Anesthesia problems Neg Hx   . Hypotension Neg Hx   . Malignant hyperthermia Neg Hx   . Pseudochol deficiency Neg Hx   . Stomach cancer Neg Hx   . Esophageal cancer Neg Hx     Social History Social History   Tobacco Use  . Smoking status: Never Smoker  . Smokeless tobacco: Never Used  Substance Use Topics  . Alcohol use: No  . Drug use: No     Allergies   Lactose intolerance (gi); Penicillins; Sulfa antibiotics; Ciprofloxacin; and Sulfasalazine   Review  of Systems Review of Systems See HPI For ROS   Physical Exam Updated Vital Signs BP (!) 151/95 (BP Location: Left Arm)   Pulse 79   Temp 97.6 F (36.4 C) (Oral)   Resp 14   SpO2 100%   Physical Exam  Constitutional: She is oriented to person, place, and time. She appears well-developed and well-nourished.  +moaning with abdominal pain throughout the interview and exam  HENT:  Head: Normocephalic and atraumatic.  Eyes: Conjunctivae are normal.  Neck: Neck supple.  Cardiovascular: Normal rate and regular rhythm. Exam reveals no gallop and no friction rub.  No murmur heard. Pulmonary/Chest: Effort normal and breath sounds normal. No respiratory distress. She has no wheezes. She has no rales.  Abdominal: Soft. She exhibits no distension.  +RUQ pain to deep palpation, +murphy sign. No rebound tenderness or guarding.  Musculoskeletal: She exhibits no deformity.  Neurological: She is alert and oriented to person, place, and time.  Skin: Skin is warm and dry.  Psychiatric: She has a normal mood and affect. Judgment and thought content normal.    ED Treatments / Results  Labs (all labs ordered are  listed, but only abnormal results are displayed) Labs Reviewed  COMPREHENSIVE METABOLIC PANEL - Abnormal; Notable for the following components:      Result Value   Potassium 3.1 (*)    CO2 14 (*)    Glucose, Bld 100 (*)    AST 52 (*)    GFR calc non Af Amer 59 (*)    Anion gap 16 (*)    All other components within normal limits  CBC - Abnormal; Notable for the following components:   WBC 12.5 (*)    Hemoglobin 16.1 (*)    All other components within normal limits  LIPASE, BLOOD  URINALYSIS, ROUTINE W REFLEX MICROSCOPIC  PROTIME-INR  I-STAT TROPONIN, ED    EKG  EKG Interpretation  Date/Time:  Thursday October 28 2017 16:16:37 EST Ventricular Rate:  82 PR Interval:    QRS Duration: 83 QT Interval:  398 QTC Calculation: 465 R Axis:   20 Text Interpretation:  Atrial fibrillation No significant change since last tracing Confirmed by Lajean Saver [phone]) on 10/28/2017 4:30:05 PM Also confirmed by Zenovia Jarred [phone])  on 10/28/2017 5:10:56 PM      Radiology No results found.  Procedures Procedures (including critical care time)  Medications Ordered in ED Medications  sodium chloride 0.9 % bolus 1,000 mL (1,000 mLs Intravenous New Bag/Given 10/28/17 1649)  sodium chloride 0.9 % bolus 500 mL (0 mLs Intravenous Stopped 10/28/17 1649)  morphine 4 MG/ML injection 2 mg (2 mg Intravenous Given 10/28/17 1509)  morphine 4 MG/ML injection 2 mg (2 mg Intravenous Given 10/28/17 1649)  ondansetron (ZOFRAN) injection 4 mg (4 mg Intravenous Given 10/28/17 1650)  iopamidol (ISOVUE-300) 61 % injection (100 mLs  Contrast Given 10/28/17 1709)    Initial Impression / Assessment and Plan / ED Course  I have reviewed the triage vital signs and the nursing notes.  Pertinent labs & imaging results that were available during my care of the patient were reviewed by me and considered in my medical decision making (see chart for details).     50yoF with history of afib on warfarin, COPD,  chronic nausea presenting for RUQ abdominal pain worsening over 4 days. She is s/p cholecystectomy and has multiple abdominal surgeries. Uncertain GI pathology causing the pain; lipase WNL to think of pancreatitis. CMP with mildly elevated AST at 52, ALT WNL.  Considered HHS but glucose 100. CBC notable for mildly elevated WBC. Patient treated with pain control and fluids in the ED. Troponin ordered. Patient with continued abdominal pain despite treatment with morphine.   CT abdomen/pelvis was ordered. Workup and plan of care signed out to the oncoming team.   Final Clinical Impressions(s) / ED Diagnoses   Final diagnoses:  None    ED Discharge Orders    None       Everrett Coombe, MD 10/28/17 1717    Lajean Saver, MD 10/29/17 [phone]

## 2017-10-28 NOTE — H&P (Signed)
History and Physical    Gordana Kewley ERX:[id number] DOB: 1930/02/14 DOA: 10/28/2017  Referring MD/NP/PA:   PCP: [name], MD   Patient coming from:  The patient is coming from home.  At baseline, pt is independent for most of ADL.   Chief Complaint: abdominal pain   HPI: [name] [name] is a 84 y.o. female with medical history significant of hypertension, hyperlipidemia, diabetes mellitus, COPD, GERD, depression with anxiety, pancreatitis, peptic valve regurgitation, atrial fibrillation on Coumadin, C. difficile colitis, IBS, who presents with abdominal pain.  Patient states that she has been having chronic intermittent abdominal pain, nausea and occasional vomiting for prolonged time. Her abdominal pain has been worsening in the past 3 months, much worse in the past 3 weeks. Today her abdominal pain becomes intractable, severe, 10 out of 10 in severity, sharp. It is located in the epigastric region, radiating to the back. It is associated with nausea, no vomiting. She has loose stool, but no obvious diarrhea. Denies fever or chills. No hematochezia and hematemesis. She states that she has poor appetite, not taking fluids well by mouth. She was previously seen on 1/20 at [facility] and treated with nitrofurantoin for UTI. Currently patient does not have dysuria, burning on urination or urinary frequency. No hematuria. Patient denies chest pain, SOB, cough, fever or chills. No unilateral weakness. Of note, pt had EGD by Dr. Carlean Purl on 06/02/16, which showed tortuous esophagus and gastritis. Pt is not taking NSAIDs or ASA. She dose not drink alcohol.  ED Course: pt was found to have WBC 12.5, negative troponin, potassium is 3.1, lipase 33, creatinine normal, negative urinalysis. Temperature normal, no tachycardia, no tachypnea, oxygen saturation 96% on room air. CT abdomen/pelvis is not impressive. Patient is placed on telemetry bed for observation.  Review of Systems:   General: no  fevers, chills, no body weight gain, has poor appetite, has fatigue HEENT: no blurry vision, hearing changes or sore throat Respiratory: no dyspnea, coughing, wheezing CV: no chest pain, no palpitations GI: has nausea,  abdominal pain, no diarrhea, vomiting,constipation GU: no dysuria, burning on urination, increased urinary frequency, hematuria  Ext: no leg edema Neuro: no unilateral weakness, numbness, or tingling, no vision change or hearing loss Skin: no rash, no skin tear. MSK: No muscle spasm, no deformity, no limitation of range of movement in spin Heme: No easy bruising.  Travel history: No recent long distant travel.  Allergy:  Allergies  Allergen Reactions  . Lactose Intolerance (Gi) Nausea And Vomiting    severe stomach pain  . Penicillins Hives and Itching    "haven't used any since 1970's" "haven't used any since 1970's"  . Sulfa Antibiotics Hives and Itching    "haven't used any since 1970's"  . Ciprofloxacin Swelling  . Sulfasalazine Hives and Itching    "haven't used any since 1970's"    Past Medical History:  Diagnosis Date  . Atrial fibrillation (HCC)    RVR hx.  Chronic Coumadin  . C. difficile colitis 09/21/2013, 11/2013  . Carotid stenosis    a. Carotid US (10/2013):  bilat 1-39%; f/u 1 year  . Chronic lower back pain   . COPD (chronic obstructive pulmonary disease) ([place])   . Depression   . GERD (gastroesophageal reflux disease)    with HH  . Glaucoma   . HCAP (healthcare-associated pneumonia) 11/21/2011  . High cholesterol   . Hypertension   . Metabolic encephalopathy 06/10/931  . Mitral regurgitation 03/11/2012  . Pancreatitis    ~2008, 11/2011 post  ERCP  . Pneumonia 12/2011   "first time I know about"  . Sepsis ([place]) 11/27/2013  . SIRS (systemic inflammatory response syndrome) ([place]) 03/13/2012   a/w post ERCP  pancreatitis.   . Type II diabetes mellitus ([place])     Past Surgical History:  Procedure Laterality Date  . APPENDECTOMY  ~ 1960  .  CATARACT EXTRACTION W/ INTRAOCULAR LENS  IMPLANT, BILATERAL  2000's  . CHOLECYSTECTOMY  1990's   Lap chole  . COLONOSCOPY  01/2003   diverticulosis, 56mm hyperplastic sigmoid polyp  . DILATION AND CURETTAGE OF UTERUS    . ERCP  11/13/2011   Procedure: ENDOSCOPIC RETROGRADE CHOLANGIOPANCREATOGRAPHY (ERCP);  Surgeon: Beryle Beams, MD;  Location[facility] ENDOSCOPY;  Service: Endoscopy;  Laterality: N/A;  . FLEXIBLE SIGMOIDOSCOPY N/A 05/02/2014   Procedure: FLEXIBLE SIGMOIDOSCOPY;  Surgeon: Gatha Mayer, MD;  Location: [facility] ENDOSCOPY;  Service: Endoscopy;  Laterality: N/A;  . TONSILLECTOMY AND ADENOIDECTOMY  ~ 1939  . VAGINAL HYSTERECTOMY  ~ 1960's    Social History:  reports that  has never smoked. she has never used smokeless tobacco. She reports that she does not drink alcohol or use drugs.  Family History:  Family History  Problem Relation Age of Onset  . Stroke Father 28  . Stroke Mother 53  . Anesthesia problems Neg Hx   . Hypotension Neg Hx   . Malignant hyperthermia Neg Hx   . Pseudochol deficiency Neg Hx   . Stomach cancer Neg Hx   . Esophageal cancer Neg Hx      Prior to Admission medications   Medication Sig Start Date End Date Taking? Authorizing Provider  dicyclomine (BENTYL) 20 MG tablet Every AM and then every 6 hrs as needed Patient taking differently: Take 20 mg by mouth daily.  02/05/15  Yes Gatha Mayer, MD  dicyclomine (BENTYL) 20 MG tablet TAKE 1/2 TABLET BY MOUTH EVERY 6 HOURS AS NEEDED FOR SPASMS(ABDOMINAL PAIN) 09/16/16  Yes Gatha Mayer, MD  DILT-XR 120 MG 24 hr capsule TAKE 1 CAPSULE(120 MG) BY MOUTH DAILY 09/29/16  Yes Minus Breeding, MD  gabapentin (NEURONTIN) 100 MG capsule Take 1 capsule by mouth 2 (two) times daily. 04/06/15  Yes [provider]  glimepiride (AMARYL) 4 MG tablet Take 4 mg by mouth daily with breakfast.    Yes [provider]  HYDROcodone-acetaminophen (NORCO/VICODIN) 5-325 MG per tablet Take 1 tablet by mouth every 6 (six)  hours as needed.  04/01/15  Yes [provider]  LANTUS SOLOSTAR 100 UNIT/ML Solostar Pen Inject 22 Units into the skin daily.  09/01/13  Yes [provider]  latanoprost (XALATAN) 0.005 % ophthalmic solution Place 1 drop into both eyes at bedtime.   Yes [provider]  loperamide (LOPERAMIDE A-D) 2 MG tablet Take 1 tablet (2 mg total) by mouth as needed for diarrhea or loose stools (use at bedtime). 11/27/14  Yes Gatha Mayer, MD  LORazepam (ATIVAN) 1 MG tablet Take 1 mg by mouth 3 (three) times daily.  06/27/14  Yes [provider]  methylphenidate (RITALIN) 5 MG tablet Take 5 mg by mouth See admin instructions. take 5mg  in the morning daily and then 5mg  twice a day on every other day 06/27/14  Yes [provider]  metoprolol tartrate (LOPRESSOR) 25 MG tablet TAKE 1 TABLET(25 MG) BY MOUTH TWICE DAILY 08/30/17  Yes Minus Breeding, MD  mirtazapine (REMERON) 7.5 MG tablet TAKE 1 TABLET(7.5 MG) BY MOUTH AT BEDTIME 10/06/16  Yes Silvano Rusk  E, MD  Multiple Vitamin (MULTIVITAMIN WITH MINERALS) TABS Take 1 tablet by mouth daily.   Yes [provider]  nitrofurantoin, macrocrystal-monohydrate, (MACROBID) 100 MG capsule Take 1 capsule (100 mg total) by mouth 2 (two) times daily. X 7 days 10/24/17  Yes Malvin Johns, MD  omeprazole (PRILOSEC) 20 MG capsule Take 20 mg by mouth 2 (two) times daily.    Yes [provider]  oxybutynin (DITROPAN) 5 MG tablet Take 1 tablet by mouth at bedtime. 07/29/16  Yes [provider]  promethazine (PHENERGAN) 25 MG tablet Take 25 mg by mouth 3 (three) times daily as needed for nausea or vomiting.   Yes [provider]  SYMBICORT 160-4.5 MCG/ACT inhaler Inhale 2 puffs into the lungs 2 (two) times daily.  03/08/14  Yes [provider]  timolol (TIMOPTIC) 0.5 % ophthalmic solution  06/02/15  Yes [provider]  warfarin (COUMADIN) 5 MG tablet Take 2.5-5 mg by mouth daily. Take  1.25mg  every Sun/Wed then 2.5 mg the rest of the week   Yes [provider]    Physical Exam: Vitals:   10/28/17 1700 10/28/17 1945 10/28/17 2259 10/29/17 0419  BP: (!) 147/92  (!) 143/77 (!) 115/58  Pulse: 78 (!) 59 85 (!) 59  Resp: 12 13 17 17   Temp:   98.2 F (36.8 C) 98.2 F (36.8 C)  TempSrc:   Oral Oral  SpO2: 100% 100% 97% 96%  Weight:   63.4 kg (139 lb 12.4 oz)   Height:   5' (1.524 m)    General: Not in acute distress HEENT:       Eyes: PERRL, EOMI, no scleral icterus.       ENT: No discharge from the ears and nose, no pharynx injection, no tonsillar enlargement.        Neck: No JVD, no bruit, no mass felt. Heme: No neck lymph node enlargement. Cardiac: S1/S2, RRR, No murmurs, No gallops or rubs. Respiratory: No rales, wheezing, rhonchi or rubs. GI: Soft, nondistended, severe tenderness in epigastric area, no rebound pain, no organomegaly, BS present. GU: No hematuria Ext: No pitting leg edema bilaterally. 2+DP/PT pulse bilaterally. Musculoskeletal: No joint deformities, No joint redness or warmth, no limitation of ROM in spin. Skin: No rashes.  Neuro: Alert, oriented X3, cranial nerves II-XII grossly intact, moves all extremities normally. Psych: Patient is not psychotic, no suicidal or hemocidal ideation.  Labs on Admission: I have personally reviewed following labs and imaging studies  CBC: Recent Labs  Lab 10/24/17 1851 10/28/17 1504 10/29/17 0308  WBC 12.3* 12.5* 10.4  HGB 15.6* 16.1* 13.7  HCT 44.8 45.8 39.6  MCV 89.1 89.6 90.6  PLT 282 261 382   Basic Metabolic Panel: Recent Labs  Lab 10/24/17 1851 10/28/17 1504 10/29/17 0308  NA 139 136 140  K 3.2* 3.1* 3.6  CL 104 106 114*  CO2 21* 14* 15*  GLUCOSE 149* 100* 70  BUN 21* 12 11  CREATININE 0.94 0.87 0.77  CALCIUM 9.5 9.2 8.4*  MG  --   --  1.7   GFR: Estimated Creatinine Clearance: 42.8 mL/min (by C-G formula based on SCr of 0.77 mg/dL). Liver Function Tests: Recent Labs  Lab  10/24/17 1851 10/28/17 1504  AST 37 52*  ALT 19 28  ALKPHOS 69 69  BILITOT 0.9 1.2  PROT 8.0 7.1  ALBUMIN 4.0 3.8   Recent Labs  Lab 10/24/17 1851 10/28/17 1504  LIPASE 25 33   No results for input(s): AMMONIA  in the last 168 hours. Coagulation Profile: Recent Labs  Lab 10/24/17 1851 10/28/17 2325 10/29/17 0308  INR 1.71 1.79 1.87   Cardiac Enzymes: Recent Labs  Lab 10/24/17 1851  TROPONINI <0.03   BNP (last 3 results) No results for input(s): PROBNP in the last 8760 hours. HbA1C: No results for input(s): HGBA1C in the last 72 hours. CBG: Recent Labs  Lab 10/28/17 2124 10/28/17 2258  GLUCAP 84 98   Lipid Profile: No results for input(s): CHOL, HDL, LDLCALC, TRIG, CHOLHDL, LDLDIRECT in the last 72 hours. Thyroid Function Tests: No results for input(s): TSH, T4TOTAL, FREET4, T3FREE, THYROIDAB in the last 72 hours. Anemia Panel: No results for input(s): VITAMINB12, FOLATE, FERRITIN, TIBC, IRON, RETICCTPCT in the last 72 hours. Urine analysis:    Component Value Date/Time   COLORURINE YELLOW 10/28/2017 1806   APPEARANCEUR CLEAR 10/28/2017 1806   LABSPEC 1.029 10/28/2017 1806   PHURINE 7.0 10/28/2017 1806   GLUCOSEU NEGATIVE 10/28/2017 1806   HGBUR NEGATIVE 10/28/2017 1806   BILIRUBINUR NEGATIVE 10/28/2017 1806   KETONESUR 20 (A) 10/28/2017 1806   PROTEINUR 30 (A) 10/28/2017 1806   UROBILINOGEN 0.2 11/23/2013 1431   NITRITE NEGATIVE 10/28/2017 1806   LEUKOCYTESUR NEGATIVE 10/28/2017 1806   Sepsis Labs: @LABRCNTIP (procalcitonin:4,lacticidven:4) ) Recent Results (from the past 240 hour(s))  Urine culture     Status: Abnormal   Collection Time: 10/24/17 10:32 PM  Result Value Ref Range Status   Specimen Description URINE, CLEAN CATCH  Final   Special Requests NONE  Final   Culture >=100,000 COLONIES/mL ESCHERICHIA COLI (A)  Final   Report Status 10/27/2017 FINAL  Final   Organism ID, Bacteria ESCHERICHIA COLI (A)  Final      Susceptibility    Escherichia coli - MIC*    AMPICILLIN <=2 SENSITIVE Sensitive     CEFAZOLIN <=4 SENSITIVE Sensitive     CEFTRIAXONE <=1 SENSITIVE Sensitive     CIPROFLOXACIN <=0.25 SENSITIVE Sensitive     GENTAMICIN <=1 SENSITIVE Sensitive     IMIPENEM <=0.25 SENSITIVE Sensitive     NITROFURANTOIN 32 SENSITIVE Sensitive     TRIMETH/SULFA <=20 SENSITIVE Sensitive     AMPICILLIN/SULBACTAM <=2 SENSITIVE Sensitive     PIP/TAZO <=4 SENSITIVE Sensitive     Extended ESBL NEGATIVE Sensitive     * >=100,000 COLONIES/mL ESCHERICHIA COLI     Radiological Exams on Admission: Ct Abdomen Pelvis W Contrast  Result Date: 10/28/2017 CLINICAL DATA:  Nausea, vomiting, epigastric and LEFT upper quadrant pain for several weeks, history pancreatitis, COPD, hypertension, atrial fibrillation, type II diabetes mellitus EXAM: CT ABDOMEN AND PELVIS WITH CONTRAST TECHNIQUE: Multidetector CT imaging of the abdomen and pelvis was performed using the standard protocol following bolus administration of intravenous contrast. Sagittal and coronal MPR images reconstructed from axial data set. CONTRAST:  144mL ISOVUE-300 IOPAMIDOL (ISOVUE-300) INJECTION 61% IV. No oral contrast. COMPARISON:  04/26/2014 FINDINGS: Lower chest: Lung bases clear Hepatobiliary: Gallbladder surgically absent. Liver normal appearance. Pancreas: Pancreas unremarkable. No evidence of pancreatic mass, ductal dilatation or peripancreatic infiltrative changes. Spleen: Normal appearance Adrenals/Urinary Tract: Adrenal glands normal appearance. Large cyst at upper pole RIGHT kidney 4.8 x 4.6 cm image 32. Kidneys, ureters, and bladder otherwise normal appearance. Stomach/Bowel: Appendix surgically absent by history. Stomach and bowel loops grossly unremarkable for exam lacking adequate distention and contrast. Vascular/Lymphatic: Atherosclerotic calcifications of aorta, iliac arteries and coronary arteries. Mitral annular calcification. Aorta normal caliber. Retroaortic LEFT renal  vein. No adenopathy. Reproductive: Uterus surgically absent with unremarkable ovaries. Other: No free  air, free fluid, hernia, or acute inflammatory process. Musculoskeletal: Bones demineralized diffusely. Advanced degenerative disc disease changes at L4-L5 progressive since prior exam with slightly increased disc space narrowing and anterolisthesis. Prominent pseudo disc at L4-L5 with multifactorial central acquired spinal stenosis. IMPRESSION: Distal colonic diverticulosis without evidence of diverticulitis. No acute intra-abdominal or intrapelvic abnormalities identified. Central acquired spinal stenosis at L4-L5 with increased anterolisthesis, pseudo disc and narrowing at the L4-L5 disc space since the previous exam. Aortic atherosclerosis and coronary arterial calcifications. Electronically Signed   By: Lavonia Dana M.D.   On: 10/28/2017 18:15     EKG: Independently reviewed.  Atrial fibrillation, QTC 465, low voltage, Q waves in lead 3/aVF   Assessment/Plan Principal Problem:   Abdominal pain Active Problems:   UTI (urinary tract infection)   DM II (diabetes mellitus, type II), controlled (HCC)   HTN (hypertension)   Hypokalemia   Atrial fibrillation    Depression   Anxiety   Encounter for long-term (current) use of anticoagulants   COPD (chronic obstructive pulmonary disease) (HCC)   Abdominal pain: Patient has intractable epigastric pain. Lipase normal. CT abdomen/pelvis is not impressive. Etiology is not clear. Patient had EGD on 06/02/16 by Dr. Carlean Purl, which showed gastritis and tortuous esophagus, indicating that the patient may have recurrent gastritis. Pt cannot eat or drink normally due to severe pain.   -will place on tele bed for obs -start IV protonix 40 mg bid -prn Mylanta -IVF: 1L NS, then 75 cc/h -When necessary Zofran for nausea, percocet and morphine for pain  UTI (urinary tract infection): pt is being treated with Macrobid for UTI since 1/20. Currently  asymptomatic. Urinalysis negative. -will let pt complete 7-day course of abx (3 dose left) -f/u urine culture  Atrial Fibrillation: CHA2DS2-VASc Score is 5, needs oral anticoagulation. Patient is on Coumadin at home. INR is 1.87 on admission. Heart rate is well controlled. -continue metoprolol and Dilt-Xr -Continue Coumadin per pharm  DM II (diabetes mellitus, type II), controlled ([place]): Last A1c 7.1 on 03/11/14, fairly controled. Patient is taking Lantus and Amaryl at home -will decrease Lantus dose from 22-15 units daily -SSI  HTN:  -Continue home medications: Metoprolol, Dilt-Xr -IV hydralazine prn  Depression and anxiety: Stable, no suicidal or homicidal ideations. -Continue home medications: Remeron, Ativan  COPD (chronic obstructive pulmonary disease) ([place]): stable -Dulera inhaler and prn albuterol nebs  Hypokalemia: K=3.1 on admission. - Repleted - Check Mg level  UTI: pt is on macrobid started on 1/20 -let pt complete 7-day course, 3 more doses.   DVT ppx: on Coumadin Code Status: Full code Family Communication: Yes, patient's  husband at bed side Disposition Plan:  Anticipate discharge back to previous home environment Consults called:  none Admission status: Obs / tele    Date of Service 10/29/2017    Ivor Costa [facility] Pager [phone]  If 7PM-7AM, please contact night-coverage [email or web address] Password [facility] 10/29/2017, 5:48 AM

## 2017-10-28 NOTE — ED Notes (Signed)
Pt placed on purewick and brief.

## 2017-10-28 NOTE — Telephone Encounter (Signed)
Post ED Visit - Positive Culture Follow-up  Culture report reviewed by antimicrobial stewardship pharmacist:  [x]  Elenor Quinones, Pharm.D. []  Heide Guile, Pharm.D., BCPS AQ-ID []  Parks Neptune, Pharm.D., BCPS []  Alycia Rossetti, Pharm.D., BCPS []  [place], [place].D., BCPS, AAHIVP []  Legrand Como, Pharm.D., BCPS, AAHIVP []  Salome Arnt, PharmD, BCPS []  Jalene Mullet, PharmD []  Vincenza Hews, PharmD, BCPS  Positive urine culture Treated with nitrofurantoin, organism sensitive to the same and no further patient follow-up is required at this time.  Hazle Nordmann 10/28/2017, 11:54 AM

## 2017-10-28 NOTE — ED Triage Notes (Signed)
Pt arrived from home via [facility] r/t pain in right flank. Pt was diagnosed with UTI at med center [facility] on Sunday. Note with paperwork states pt was last given pain medication of hydrocodone at 845 this morning.

## 2017-10-29 ENCOUNTER — Other Ambulatory Visit: Payer: Self-pay

## 2017-10-29 ENCOUNTER — Encounter (HOSPITAL_COMMUNITY): Payer: Self-pay

## 2017-10-29 DIAGNOSIS — I4891 Unspecified atrial fibrillation: Secondary | ICD-10-CM | POA: Diagnosis not present

## 2017-10-29 DIAGNOSIS — N3000 Acute cystitis without hematuria: Secondary | ICD-10-CM | POA: Diagnosis not present

## 2017-10-29 DIAGNOSIS — I10 Essential (primary) hypertension: Secondary | ICD-10-CM | POA: Diagnosis not present

## 2017-10-29 DIAGNOSIS — E876 Hypokalemia: Secondary | ICD-10-CM | POA: Diagnosis not present

## 2017-10-29 DIAGNOSIS — Z7901 Long term (current) use of anticoagulants: Secondary | ICD-10-CM | POA: Diagnosis not present

## 2017-10-29 DIAGNOSIS — J449 Chronic obstructive pulmonary disease, unspecified: Secondary | ICD-10-CM | POA: Diagnosis not present

## 2017-10-29 DIAGNOSIS — F419 Anxiety disorder, unspecified: Secondary | ICD-10-CM | POA: Diagnosis not present

## 2017-10-29 DIAGNOSIS — R1013 Epigastric pain: Secondary | ICD-10-CM | POA: Diagnosis not present

## 2017-10-29 LAB — BASIC METABOLIC PANEL
Anion gap: 11 (ref 5–15)
BUN: 11 mg/dL (ref 6–20)
CALCIUM: 8.4 mg/dL — AB (ref 8.9–10.3)
CO2: 15 mmol/L — ABNORMAL LOW (ref 22–32)
CREATININE: 0.77 mg/dL (ref 0.44–1.00)
Chloride: 114 mmol/L — ABNORMAL HIGH (ref 101–111)
Glucose, Bld: 70 mg/dL (ref 65–99)
Potassium: 3.6 mmol/L (ref 3.5–5.1)
SODIUM: 140 mmol/L (ref 135–145)

## 2017-10-29 LAB — CBC
HCT: 39.6 % (ref 36.0–46.0)
Hemoglobin: 13.7 g/dL (ref 12.0–15.0)
MCH: 31.4 pg (ref 26.0–34.0)
MCHC: 34.6 g/dL (ref 30.0–36.0)
MCV: 90.6 fL (ref 78.0–100.0)
PLATELETS: 217 10*3/uL (ref 150–400)
RBC: 4.37 MIL/uL (ref 3.87–5.11)
RDW: 13 % (ref 11.5–15.5)
WBC: 10.4 10*3/uL (ref 4.0–10.5)

## 2017-10-29 LAB — GLUCOSE, CAPILLARY
GLUCOSE-CAPILLARY: 98 mg/dL (ref 65–99)
GLUCOSE-CAPILLARY: 66 mg/dL (ref 65–99)
GLUCOSE-CAPILLARY: 82 mg/dL (ref 65–99)
GLUCOSE-CAPILLARY: 147 mg/dL — AB (ref 65–99)
Glucose-Capillary: 145 mg/dL — ABNORMAL HIGH (ref 65–99)
Glucose-Capillary: 160 mg/dL — ABNORMAL HIGH (ref 65–99)

## 2017-10-29 LAB — PROTIME-INR
INR: 1.79
INR: 1.87
PROTHROMBIN TIME: 20.6 s — AB (ref 11.4–15.2)
PROTHROMBIN TIME: 21.3 s — AB (ref 11.4–15.2)

## 2017-10-29 LAB — MAGNESIUM: MAGNESIUM: 1.7 mg/dL (ref 1.7–2.4)

## 2017-10-29 MED ORDER — MORPHINE SULFATE (PF) 4 MG/ML IV SOLN: 0.5000 mg | Freq: Three times a day (TID) | INTRAVENOUS | Status: DC | PRN

## 2017-10-29 MED ORDER — METOPROLOL TARTRATE 12.5 MG HALF TABLET
12.5000 mg | ORAL_TABLET | Freq: Two times a day (BID) | ORAL | Status: DC
  Administered 2017-10-29 – 2017-11-01 (×7): 12.5 mg via ORAL
  Filled 2017-10-29 (×7): qty 1

## 2017-10-29 MED ORDER — WARFARIN SODIUM 1 MG PO TABS
1.0000 mg | ORAL_TABLET | Freq: Once | ORAL | Status: AC
  Administered 2017-10-29: 1 mg via ORAL
  Filled 2017-10-29: qty 1

## 2017-10-29 MED ORDER — WARFARIN - PHARMACIST DOSING INPATIENT
Freq: Every day | Status: DC
  Administered 2017-10-29: 17:00:00

## 2017-10-29 MED ORDER — WARFARIN SODIUM 4 MG PO TABS
4.0000 mg | ORAL_TABLET | ORAL | Status: AC
  Administered 2017-10-29: 4 mg via ORAL
  Filled 2017-10-29: qty 1

## 2017-10-29 NOTE — Progress Notes (Signed)
ANTICOAGULATION CONSULT NOTE - [place] for Coumadin Indication: atrial fibrillation  Allergies  Allergen Reactions  . Lactose Intolerance (Gi) Nausea And Vomiting    severe stomach pain  . Penicillins Hives and Itching    "haven't used any since 1970's" "haven't used any since 1970's"  . Sulfa Antibiotics Hives and Itching    "haven't used any since 1970's"  . Ciprofloxacin Swelling  . Sulfasalazine Hives and Itching    "haven't used any since 1970's"    Patient Measurements: Height: 5' (152.4 cm) Weight: 139 lb 12.4 oz (63.4 kg) IBW/kg (Calculated) : 45.5  Vital Signs: Temp: 98.2 F (36.8 C) (01/25 0419) Temp Source: Oral (01/25 0419) BP: 145/75 (01/25 1043) Pulse Rate: 67 (01/25 1043)  Labs: Recent Labs    10/28/17 1504 10/28/17 2325 10/29/17 0308  HGB 16.1*  --  13.7  HCT 45.8  --  39.6  PLT 261  --  217  LABPROT  --  20.6* 21.3*  INR  --  1.79 1.87  CREATININE 0.87  --  0.77    Estimated Creatinine Clearance: 42.8 mL/min (by C-G formula based on SCr of 0.77 mg/dL).   Medical History: Past Medical History:  Diagnosis Date  . Atrial fibrillation (HCC)    RVR hx.  Chronic Coumadin  . C. difficile colitis 09/21/2013, 11/2013  . Carotid stenosis    a. Carotid US (10/2013):  bilat 1-39%; f/u 1 year  . Chronic lower back pain   . COPD (chronic obstructive pulmonary disease) ([place])   . Depression   . GERD (gastroesophageal reflux disease)    with HH  . Glaucoma   . HCAP (healthcare-associated pneumonia) 11/21/2011  . High cholesterol   . Hypertension   . Metabolic encephalopathy 03/09/4649  . Mitral regurgitation 03/11/2012  . Pancreatitis    ~2008, 11/2011 post ERCP  . Pneumonia 12/2011   "first time I know about"  . Sepsis ([place]) 11/27/2013  . SIRS (systemic inflammatory response syndrome) ([place]) 03/13/2012   a/w post ERCP  pancreatitis.   . Type II diabetes mellitus ([place])     Medications:  Facility-Administered Medications  Prior to Admission  Medication Dose Route Frequency Provider Last Rate Last Dose  . 0.9 %  sodium chloride infusion  500 mL Intravenous Continuous Gatha Mayer, MD       Medications Prior to Admission  Medication Sig Dispense Refill Last Dose  . dicyclomine (BENTYL) 20 MG tablet Every AM and then every 6 hrs as needed (Patient taking differently: Take 20 mg by mouth daily. ) 90 tablet 4 10/27/2017 at Unknown time  . dicyclomine (BENTYL) 20 MG tablet TAKE 1/2 TABLET BY MOUTH EVERY 6 HOURS AS NEEDED FOR SPASMS(ABDOMINAL PAIN) 90 tablet 11 unk  . DILT-XR 120 MG 24 hr capsule TAKE 1 CAPSULE(120 MG) BY MOUTH DAILY 60 capsule 11 10/27/2017 at Unknown time  . gabapentin (NEURONTIN) 100 MG capsule Take 1 capsule by mouth 2 (two) times daily.   10/27/2017 at Unknown time  . glimepiride (AMARYL) 4 MG tablet Take 4 mg by mouth daily with breakfast.    10/27/2017 at Unknown time  . HYDROcodone-acetaminophen (NORCO/VICODIN) 5-325 MG per tablet Take 1 tablet by mouth every 6 (six) hours as needed.    10/28/2017 at Unknown time  . LANTUS SOLOSTAR 100 UNIT/ML Solostar Pen Inject 22 Units into the skin daily.    10/27/2017 at Unknown time  . latanoprost (XALATAN) 0.005 % ophthalmic solution Place 1 drop into both eyes at  bedtime.   10/27/2017 at Unknown time  . loperamide (LOPERAMIDE A-D) 2 MG tablet Take 1 tablet (2 mg total) by mouth as needed for diarrhea or loose stools (use at bedtime). 30 tablet 0 unk  . LORazepam (ATIVAN) 1 MG tablet Take 1 mg by mouth 3 (three) times daily.    10/27/2017 at Unknown time  . methylphenidate (RITALIN) 5 MG tablet Take 5 mg by mouth See admin instructions. take 5mg  in the morning daily and then 5mg  twice a day on every other day   10/27/2017 at Unknown time  . metoprolol tartrate (LOPRESSOR) 25 MG tablet TAKE 1 TABLET(25 MG) BY MOUTH TWICE DAILY 180 tablet 0 10/27/2017 at Unknown time  . mirtazapine (REMERON) 7.5 MG tablet TAKE 1 TABLET(7.5 MG) BY MOUTH AT BEDTIME 30 tablet 11  10/27/2017 at Unknown time  . Multiple Vitamin (MULTIVITAMIN WITH MINERALS) TABS Take 1 tablet by mouth daily.   10/27/2017 at Unknown time  . nitrofurantoin, macrocrystal-monohydrate, (MACROBID) 100 MG capsule Take 1 capsule (100 mg total) by mouth 2 (two) times daily. X 7 days 14 capsule 0 10/28/2017 at Unknown time  . omeprazole (PRILOSEC) 20 MG capsule Take 20 mg by mouth 2 (two) times daily.    10/27/2017 at Unknown time  . oxybutynin (DITROPAN) 5 MG tablet Take 1 tablet by mouth at bedtime.  6 10/27/2017 at Unknown time  . promethazine (PHENERGAN) 25 MG tablet Take 25 mg by mouth 3 (three) times daily as needed for nausea or vomiting.   unk  . SYMBICORT 160-4.5 MCG/ACT inhaler Inhale 2 puffs into the lungs 2 (two) times daily.    10/27/2017 at Unknown time  . timolol (TIMOPTIC) 0.5 % ophthalmic solution   2 10/27/2017 at Unknown time  . warfarin (COUMADIN) 5 MG tablet Take 2.5-5 mg by mouth daily. Take 1.25mg  every Sun/Wed then 2.5 mg the rest of the week   10/27/2017 at 6pm   Scheduled:  . dicyclomine  10 mg Oral TID AC  . diltiazem  120 mg Oral Daily  . gabapentin  100 mg Oral BID  . insulin aspart  0-9 Units Subcutaneous TID WC  . insulin glargine  15 Units Subcutaneous Daily  . latanoprost  1 drop Both Eyes QHS  . LORazepam  1 mg Oral TID  . methylphenidate  5 mg Oral Daily  . metoprolol tartrate  12.5 mg Oral BID  . mirtazapine  7.5 mg Oral QHS  . mometasone-formoterol  2 puff Inhalation BID  . multivitamin with minerals  1 tablet Oral Daily  . nitrofurantoin (macrocrystal-monohydrate)  100 mg Oral BID  . oxybutynin  5 mg Oral QHS  . pantoprazole (PROTONIX) IV  40 mg Intravenous Q12H  . timolol  1 drop Both Eyes Daily  . Warfarin - Pharmacist Dosing Inpatient   Does not apply q1800   Infusions:  . sodium chloride 75 mL/hr at 10/29/17 0010    Assessment: 84 yo female c/o abdominal pain radiating to back, PCP sent pt to ED, admitted for further eval, to continue Coumadin for Afib.   Current INR below goal.  Pt did receive boosted dose (4mg ) overnight (~0100 1/25).  Will give low dose tonight.  Goal of Therapy:  INR 2-3   Plan:  Coumadin 1mg  PO x 1 tonight Daily INR  [employer], Pharm.D., BCPS Clinical Pharmacist Pager: [phone] Clinical phone for 10/29/2017 from 8:30-4:00 is [phone]. After 4pm, please call Main Rx [phone]) for assistance. 10/29/2017 12:16 PM

## 2017-10-29 NOTE — Progress Notes (Signed)
ANTICOAGULATION CONSULT NOTE - Initial Consult  Pharmacy Consult for Coumadin Indication: atrial fibrillation  Allergies  Allergen Reactions  . Lactose Intolerance (Gi) Nausea And Vomiting    severe stomach pain  . Penicillins Hives and Itching    "haven't used any since 1970's" "haven't used any since 1970's"  . Sulfa Antibiotics Hives and Itching    "haven't used any since 1970's"  . Ciprofloxacin Swelling  . Sulfasalazine Hives and Itching    "haven't used any since 1970's"    Patient Measurements: Height: 5' (152.4 cm) Weight: 139 lb 12.4 oz (63.4 kg) IBW/kg (Calculated) : 45.5  Vital Signs: Temp: 98.2 F (36.8 C) (01/24 2259) Temp Source: Oral (01/24 2259) BP: 143/77 (01/24 2259) Pulse Rate: 85 (01/24 2259)  Labs: Recent Labs    10/28/17 1504 10/28/17 2325  HGB 16.1*  --   HCT 45.8  --   PLT 261  --   LABPROT  --  20.6*  INR  --  1.79  CREATININE 0.87  --     Estimated Creatinine Clearance: 39.3 mL/min (by C-G formula based on SCr of 0.87 mg/dL).   Medical History: Past Medical History:  Diagnosis Date  . Atrial fibrillation (HCC)    RVR hx.  Chronic Coumadin  . C. difficile colitis 09/21/2013, 11/2013  . Carotid stenosis    a. Carotid US (10/2013):  bilat 1-39%; f/u 1 year  . Chronic lower back pain   . COPD (chronic obstructive pulmonary disease) ([place])   . Depression   . GERD (gastroesophageal reflux disease)    with HH  . Glaucoma   . HCAP (healthcare-associated pneumonia) 11/21/2011  . High cholesterol   . Hypertension   . Metabolic encephalopathy 12/05/8248  . Mitral regurgitation 03/11/2012  . Pancreatitis    ~2008, 11/2011 post ERCP  . Pneumonia 12/2011   "first time I know about"  . Sepsis ([place]) 11/27/2013  . SIRS (systemic inflammatory response syndrome) ([place]) 03/13/2012   a/w post ERCP  pancreatitis.   . Type II diabetes mellitus ([place])     Medications:  Facility-Administered Medications Prior to Admission  Medication Dose Route  Frequency Provider Last Rate Last Dose  . 0.9 %  sodium chloride infusion  500 mL Intravenous Continuous Gatha Mayer, MD       Medications Prior to Admission  Medication Sig Dispense Refill Last Dose  . dicyclomine (BENTYL) 20 MG tablet Every AM and then every 6 hrs as needed (Patient taking differently: Take 20 mg by mouth daily. ) 90 tablet 4 10/27/2017 at Unknown time  . dicyclomine (BENTYL) 20 MG tablet TAKE 1/2 TABLET BY MOUTH EVERY 6 HOURS AS NEEDED FOR SPASMS(ABDOMINAL PAIN) 90 tablet 11 unk  . DILT-XR 120 MG 24 hr capsule TAKE 1 CAPSULE(120 MG) BY MOUTH DAILY 60 capsule 11 10/27/2017 at Unknown time  . gabapentin (NEURONTIN) 100 MG capsule Take 1 capsule by mouth 2 (two) times daily.   10/27/2017 at Unknown time  . glimepiride (AMARYL) 4 MG tablet Take 4 mg by mouth daily with breakfast.    10/27/2017 at Unknown time  . HYDROcodone-acetaminophen (NORCO/VICODIN) 5-325 MG per tablet Take 1 tablet by mouth every 6 (six) hours as needed.    10/28/2017 at Unknown time  . LANTUS SOLOSTAR 100 UNIT/ML Solostar Pen Inject 22 Units into the skin daily.    10/27/2017 at Unknown time  . latanoprost (XALATAN) 0.005 % ophthalmic solution Place 1 drop into both eyes at bedtime.   10/27/2017 at Unknown time  .  loperamide (LOPERAMIDE A-D) 2 MG tablet Take 1 tablet (2 mg total) by mouth as needed for diarrhea or loose stools (use at bedtime). 30 tablet 0 unk  . LORazepam (ATIVAN) 1 MG tablet Take 1 mg by mouth 3 (three) times daily.    10/27/2017 at Unknown time  . methylphenidate (RITALIN) 5 MG tablet Take 5 mg by mouth See admin instructions. take 5mg  in the morning daily and then 5mg  twice a day on every other day   10/27/2017 at Unknown time  . metoprolol tartrate (LOPRESSOR) 25 MG tablet TAKE 1 TABLET(25 MG) BY MOUTH TWICE DAILY 180 tablet 0 10/27/2017 at Unknown time  . mirtazapine (REMERON) 7.5 MG tablet TAKE 1 TABLET(7.5 MG) BY MOUTH AT BEDTIME 30 tablet 11 10/27/2017 at Unknown time  . Multiple Vitamin  (MULTIVITAMIN WITH MINERALS) TABS Take 1 tablet by mouth daily.   10/27/2017 at Unknown time  . nitrofurantoin, macrocrystal-monohydrate, (MACROBID) 100 MG capsule Take 1 capsule (100 mg total) by mouth 2 (two) times daily. X 7 days 14 capsule 0 10/28/2017 at Unknown time  . omeprazole (PRILOSEC) 20 MG capsule Take 20 mg by mouth 2 (two) times daily.    10/27/2017 at Unknown time  . oxybutynin (DITROPAN) 5 MG tablet Take 1 tablet by mouth at bedtime.  6 10/27/2017 at Unknown time  . promethazine (PHENERGAN) 25 MG tablet Take 25 mg by mouth 3 (three) times daily as needed for nausea or vomiting.   unk  . SYMBICORT 160-4.5 MCG/ACT inhaler Inhale 2 puffs into the lungs 2 (two) times daily.    10/27/2017 at Unknown time  . timolol (TIMOPTIC) 0.5 % ophthalmic solution   2 10/27/2017 at Unknown time  . warfarin (COUMADIN) 5 MG tablet Take 2.5-5 mg by mouth daily. Take 1.25mg  every Sun/Wed then 2.5 mg the rest of the week   10/27/2017 at 6pm   Scheduled:  . dicyclomine  10 mg Oral TID AC  . diltiazem  120 mg Oral Daily  . gabapentin  100 mg Oral BID  . insulin aspart  0-9 Units Subcutaneous TID WC  . insulin glargine  15 Units Subcutaneous Daily  . latanoprost  1 drop Both Eyes QHS  . LORazepam  1 mg Oral TID  . methylphenidate  5 mg Oral Daily  . metoprolol tartrate  25 mg Oral BID  . mirtazapine  7.5 mg Oral QHS  . mometasone-formoterol  2 puff Inhalation BID  . multivitamin with minerals  1 tablet Oral Daily  . nitrofurantoin (macrocrystal-monohydrate)  100 mg Oral BID  . oxybutynin  5 mg Oral QHS  . pantoprazole (PROTONIX) IV  40 mg Intravenous Q12H  . sterile water (preservative free)      . timolol  1 drop Both Eyes Daily  . warfarin  4 mg Oral NOW  . Warfarin - Pharmacist Dosing Inpatient   Does not apply q1800   Infusions:  . sodium chloride 75 mL/hr at 10/29/17 0010    Assessment: 84yo female c/o abdominal pain radiating to back, PCP sent pt to ED, admitted for further eval, to continue  Coumadin for Afib; current INR below goal w/ last dose of Coumadin taken 1/23.  Goal of Therapy:  INR 2-3   Plan:  Will give boosted Coumadin dose of 4mg  x1 now and monitor INR for dose adjustments.  Wynona Neat, PharmD, BCPS  10/29/2017,12:58 AM

## 2017-10-29 NOTE — Care Management Obs Status (Signed)
MEDICARE OBSERVATION STATUS NOTIFICATION   Patient Details  Name: [name] [name] MRN: [id number] Date of Birth: [birth year]   Medicare Observation Status Notification Given:  Yes    Carles Collet, RN 10/29/2017, 1:35 PM

## 2017-10-29 NOTE — Progress Notes (Signed)
PROGRESS NOTE  [name] KPT:[id number] DOB: 30-Oct-1929 DOA: 10/28/2017 PCP: [name], MD  HPI/Recap of past 24 hours: [name] [name] is a 84 y.o. female with medical history significant of hypertension, hyperlipidemia, diabetes mellitus, COPD, GERD, depression with anxiety, pancreatitis, atrial fibrillation on Coumadin, C. difficile colitis, IBS, who presents with chronic intermittent abdominal pain for the past couple of months, worse over the past 3 weeks. Pain is associated with nausea and occasional vomiting for prolonged time. On admission, pain is located in epigastric region, radiates to her back, 10/10 in intensity. She has occasional loose stool, but has a hx of IBS. Denies fever or chills or any other symptoms. Of note, pt had EGD by Dr. Carlean Purl on 06/02/16, which showed tortuous esophagus and gastritis, neg for H.pylori. Pt denies NSAIDs or ASA or alcohol usage. In the ED, lipase was noted to be WNL. CT abdomen/pelvis showed no acute abnormality. Pt has a hx of cholecystectomy. Patient admitted for further management.  Today, pt reports pain is under control with pain meds, still c/o nausea, denies any chest pain, SOB, fever/chills. Pt still with poor appetite, but denies association with food and epigastric pain  Assessment/Plan: Principal Problem:   Abdominal pain Active Problems:   UTI (urinary tract infection)   DM II (diabetes mellitus, type II), controlled (HCC)   HTN (hypertension)   Hypokalemia   Atrial fibrillation    Depression   Anxiety   Encounter for long-term (current) use of anticoagulants   COPD (chronic obstructive pulmonary disease) (HCC)  Abdominal pain Currently under control with pain med Unknown etiology, likely gastritis. Unlikely pancreatitis, r/o ??mesenteric ischemia, although atypical for epigastric pain. Watch out for any rash, ??shingles (although pain is chronic) Afebrile, no leukocytosis Lipase WNL CT abdomen/pelvis no acute  changes EGD on 06/02/16 by Dr. Carlean Purl, which showed gastritis and tortuous esophagus  May consider CT angio abdomen to r/o mesenteric ischemia Continue IV protonix 40 mg bid IVF: 1L NS, then 75 cc/h Zofran for nausea prn, percocet and morphine for pain  UTI Currently asypmtomatic Repeat UA neg, UC pending Continue Macrobid, started 1/20, last dose 1/26  Chronic Atrial Fibrillation Rate controlled CHA2DS2-VASc Score is 5 Continue Coumadin, metoprolol and Dilt-Xr Pharm to dose  DM type II  Last A1c 7.1 on 03/11/14 SSI, hypoglycemic protocol On Lantus and Amaryl at home, will decrease Lantus dose from 22->15 units daily  HTN:  -Continue home medications: Metoprolol, Dilt-Xr -IV hydralazine prn  Depression and anxiety Stable, no suicidal or homicidal ideations. Continue home medications: Remeron, Ativan  COPD Stable Dulera inhaler and prn albuterol nebs  Hypokalemia Resolved Replace prn    Code Status: Full  Family Communication: None at bedside  Disposition Plan: Home once stable   Consultants:  None  Procedures:  None  Antimicrobials:  None  DVT prophylaxis:  Coumadin   Objective: Vitals:   10/29/17 0419 10/29/17 0858 10/29/17 1043 10/29/17 1359  BP: (!) 115/58  (!) 145/75 132/89  Pulse: (!) 59  67 74  Resp: 17   16  Temp: 98.2 F (36.8 C)   98.2 F (36.8 C)  TempSrc: Oral     SpO2: 96% 95%  98%  Weight:      Height:        Intake/Output Summary (Last 24 hours) at 10/29/2017 1545 Last data filed at 10/29/2017 1351 Gross per 24 hour  Intake 1572.5 ml  Output 3 ml  Net 1569.5 ml   Filed Weights   10/28/17 2259  Weight:  63.4 kg (139 lb 12.4 oz)    Exam:   General: Alert, awake, oriented, NAD  Cardiovascular: Irregularly irregular, no added heart sounds  Respiratory: Chest clear bilaterally  Abdomen: Soft, mild epigastric tenderness, nondistended, bowel sounds present  Musculoskeletal: No pedal edema  bilaterally  Skin: Normal  Psychiatry: Normal mood   Data Reviewed: CBC: Recent Labs  Lab 10/24/17 1851 10/28/17 1504 10/29/17 0308  WBC 12.3* 12.5* 10.4  HGB 15.6* 16.1* 13.7  HCT 44.8 45.8 39.6  MCV 89.1 89.6 90.6  PLT 282 261 932   Basic Metabolic Panel: Recent Labs  Lab 10/24/17 1851 10/28/17 1504 10/29/17 0308  NA 139 136 140  K 3.2* 3.1* 3.6  CL 104 106 114*  CO2 21* 14* 15*  GLUCOSE 149* 100* 70  BUN 21* 12 11  CREATININE 0.94 0.87 0.77  CALCIUM 9.5 9.2 8.4*  MG  --   --  1.7   GFR: Estimated Creatinine Clearance: 42.8 mL/min (by C-G formula based on SCr of 0.77 mg/dL). Liver Function Tests: Recent Labs  Lab 10/24/17 1851 10/28/17 1504  AST 37 52*  ALT 19 28  ALKPHOS 69 69  BILITOT 0.9 1.2  PROT 8.0 7.1  ALBUMIN 4.0 3.8   Recent Labs  Lab 10/24/17 1851 10/28/17 1504  LIPASE 25 33   No results for input(s): AMMONIA in the last 168 hours. Coagulation Profile: Recent Labs  Lab 10/24/17 1851 10/28/17 2325 10/29/17 0308  INR 1.71 1.79 1.87   Cardiac Enzymes: Recent Labs  Lab 10/24/17 1851  TROPONINI <0.03   BNP (last 3 results) No results for input(s): PROBNP in the last 8760 hours. HbA1C: No results for input(s): HGBA1C in the last 72 hours. CBG: Recent Labs  Lab 10/28/17 2124 10/28/17 2258 10/29/17 0736 10/29/17 0839 10/29/17 1202  GLUCAP 84 98 66 145* 160*   Lipid Profile: No results for input(s): CHOL, HDL, LDLCALC, TRIG, CHOLHDL, LDLDIRECT in the last 72 hours. Thyroid Function Tests: No results for input(s): TSH, T4TOTAL, FREET4, T3FREE, THYROIDAB in the last 72 hours. Anemia Panel: No results for input(s): VITAMINB12, FOLATE, FERRITIN, TIBC, IRON, RETICCTPCT in the last 72 hours. Urine analysis:    Component Value Date/Time   COLORURINE YELLOW 10/28/2017 1806   APPEARANCEUR CLEAR 10/28/2017 1806   LABSPEC 1.029 10/28/2017 1806   PHURINE 7.0 10/28/2017 1806   GLUCOSEU NEGATIVE 10/28/2017 1806   HGBUR NEGATIVE  10/28/2017 1806   BILIRUBINUR NEGATIVE 10/28/2017 1806   KETONESUR 20 (A) 10/28/2017 1806   PROTEINUR 30 (A) 10/28/2017 1806   UROBILINOGEN 0.2 11/23/2013 1431   NITRITE NEGATIVE 10/28/2017 1806   LEUKOCYTESUR NEGATIVE 10/28/2017 1806   Sepsis Labs: @LABRCNTIP (procalcitonin:4,lacticidven:4)  ) Recent Results (from the past 240 hour(s))  Urine culture     Status: Abnormal   Collection Time: 10/24/17 10:32 PM  Result Value Ref Range Status   Specimen Description URINE, CLEAN CATCH  Final   Special Requests NONE  Final   Culture >=100,000 COLONIES/mL ESCHERICHIA COLI (A)  Final   Report Status 10/27/2017 FINAL  Final   Organism ID, Bacteria ESCHERICHIA COLI (A)  Final      Susceptibility   Escherichia coli - MIC*    AMPICILLIN <=2 SENSITIVE Sensitive     CEFAZOLIN <=4 SENSITIVE Sensitive     CEFTRIAXONE <=1 SENSITIVE Sensitive     CIPROFLOXACIN <=0.25 SENSITIVE Sensitive     GENTAMICIN <=1 SENSITIVE Sensitive     IMIPENEM <=0.25 SENSITIVE Sensitive     NITROFURANTOIN 32 SENSITIVE Sensitive  TRIMETH/SULFA <=20 SENSITIVE Sensitive     AMPICILLIN/SULBACTAM <=2 SENSITIVE Sensitive     PIP/TAZO <=4 SENSITIVE Sensitive     Extended ESBL NEGATIVE Sensitive     * >=100,000 COLONIES/mL ESCHERICHIA COLI      Studies: Ct Abdomen Pelvis W Contrast  Result Date: 10/28/2017 CLINICAL DATA:  Nausea, vomiting, epigastric and LEFT upper quadrant pain for several weeks, history pancreatitis, COPD, hypertension, atrial fibrillation, type II diabetes mellitus EXAM: CT ABDOMEN AND PELVIS WITH CONTRAST TECHNIQUE: Multidetector CT imaging of the abdomen and pelvis was performed using the standard protocol following bolus administration of intravenous contrast. Sagittal and coronal MPR images reconstructed from axial data set. CONTRAST:  159mL ISOVUE-300 IOPAMIDOL (ISOVUE-300) INJECTION 61% IV. No oral contrast. COMPARISON:  04/26/2014 FINDINGS: Lower chest: Lung bases clear Hepatobiliary:  Gallbladder surgically absent. Liver normal appearance. Pancreas: Pancreas unremarkable. No evidence of pancreatic mass, ductal dilatation or peripancreatic infiltrative changes. Spleen: Normal appearance Adrenals/Urinary Tract: Adrenal glands normal appearance. Large cyst at upper pole RIGHT kidney 4.8 x 4.6 cm image 32. Kidneys, ureters, and bladder otherwise normal appearance. Stomach/Bowel: Appendix surgically absent by history. Stomach and bowel loops grossly unremarkable for exam lacking adequate distention and contrast. Vascular/Lymphatic: Atherosclerotic calcifications of aorta, iliac arteries and coronary arteries. Mitral annular calcification. Aorta normal caliber. Retroaortic LEFT renal vein. No adenopathy. Reproductive: Uterus surgically absent with unremarkable ovaries. Other: No free air, free fluid, hernia, or acute inflammatory process. Musculoskeletal: Bones demineralized diffusely. Advanced degenerative disc disease changes at L4-L5 progressive since prior exam with slightly increased disc space narrowing and anterolisthesis. Prominent pseudo disc at L4-L5 with multifactorial central acquired spinal stenosis. IMPRESSION: Distal colonic diverticulosis without evidence of diverticulitis. No acute intra-abdominal or intrapelvic abnormalities identified. Central acquired spinal stenosis at L4-L5 with increased anterolisthesis, pseudo disc and narrowing at the L4-L5 disc space since the previous exam. Aortic atherosclerosis and coronary arterial calcifications. Electronically Signed   By: Lavonia Dana M.D.   On: 10/28/2017 18:15    Scheduled Meds: . dicyclomine  10 mg Oral TID AC  . diltiazem  120 mg Oral Daily  . gabapentin  100 mg Oral BID  . insulin aspart  0-9 Units Subcutaneous TID WC  . insulin glargine  15 Units Subcutaneous Daily  . latanoprost  1 drop Both Eyes QHS  . LORazepam  1 mg Oral TID  . methylphenidate  5 mg Oral Daily  . metoprolol tartrate  12.5 mg Oral BID  . mirtazapine   7.5 mg Oral QHS  . mometasone-formoterol  2 puff Inhalation BID  . multivitamin with minerals  1 tablet Oral Daily  . nitrofurantoin (macrocrystal-monohydrate)  100 mg Oral BID  . oxybutynin  5 mg Oral QHS  . pantoprazole (PROTONIX) IV  40 mg Intravenous Q12H  . timolol  1 drop Both Eyes Daily  . warfarin  1 mg Oral ONCE-1800  . Warfarin - Pharmacist Dosing Inpatient   Does not apply q1800    Continuous Infusions: . sodium chloride 75 mL/hr at 10/29/17 1349     LOS: 0 days     Alma Friendly, MD [facility]   If 7PM-7AM, please contact night-coverage [email or web address] Password [facility] 10/29/2017, 3:45 PM

## 2017-10-29 NOTE — Progress Notes (Addendum)
CRITICAL VALUE ALERT  Critical Value:  CBG 66  Date & Time Notied:  10/29/17 @ 0800  Provider Notified:   Orders Received/Actions taken: Orange juice given and CBG rechecked. CBG 145.

## 2017-10-30 ENCOUNTER — Encounter (HOSPITAL_COMMUNITY): Payer: Self-pay

## 2017-10-30 DIAGNOSIS — R1013 Epigastric pain: Secondary | ICD-10-CM | POA: Diagnosis not present

## 2017-10-30 DIAGNOSIS — J449 Chronic obstructive pulmonary disease, unspecified: Secondary | ICD-10-CM | POA: Diagnosis not present

## 2017-10-30 DIAGNOSIS — I10 Essential (primary) hypertension: Secondary | ICD-10-CM | POA: Diagnosis not present

## 2017-10-30 DIAGNOSIS — Z7901 Long term (current) use of anticoagulants: Secondary | ICD-10-CM | POA: Diagnosis not present

## 2017-10-30 DIAGNOSIS — F419 Anxiety disorder, unspecified: Secondary | ICD-10-CM | POA: Diagnosis not present

## 2017-10-30 DIAGNOSIS — E876 Hypokalemia: Secondary | ICD-10-CM | POA: Diagnosis not present

## 2017-10-30 DIAGNOSIS — I4891 Unspecified atrial fibrillation: Secondary | ICD-10-CM | POA: Diagnosis not present

## 2017-10-30 DIAGNOSIS — N3000 Acute cystitis without hematuria: Secondary | ICD-10-CM | POA: Diagnosis not present

## 2017-10-30 LAB — CBC WITH DIFFERENTIAL/PLATELET
Basophils Absolute: 0 10*3/uL (ref 0.0–0.1)
Basophils Relative: 0 %
EOS PCT: 2 %
Eosinophils Absolute: 0.2 10*3/uL (ref 0.0–0.7)
HCT: 40.5 % (ref 36.0–46.0)
Hemoglobin: 13.9 g/dL (ref 12.0–15.0)
LYMPHS ABS: 3.3 10*3/uL (ref 0.7–4.0)
LYMPHS PCT: 37 %
MCH: 31.7 pg (ref 26.0–34.0)
MCHC: 34.3 g/dL (ref 30.0–36.0)
MCV: 92.5 fL (ref 78.0–100.0)
MONO ABS: 0.5 10*3/uL (ref 0.1–1.0)
MONOS PCT: 6 %
Neutro Abs: 5 10*3/uL (ref 1.7–7.7)
Neutrophils Relative %: 55 %
PLATELETS: 194 10*3/uL (ref 150–400)
RBC: 4.38 MIL/uL (ref 3.87–5.11)
RDW: 13.4 % (ref 11.5–15.5)
WBC: 9 10*3/uL (ref 4.0–10.5)

## 2017-10-30 LAB — URINE CULTURE: CULTURE: NO GROWTH

## 2017-10-30 LAB — PROTIME-INR
INR: 2.34
Prothrombin Time: 25.5 seconds — ABNORMAL HIGH (ref 11.4–15.2)

## 2017-10-30 LAB — BASIC METABOLIC PANEL
Anion gap: 8 (ref 5–15)
BUN: 9 mg/dL (ref 6–20)
CHLORIDE: 114 mmol/L — AB (ref 101–111)
CO2: 19 mmol/L — AB (ref 22–32)
Calcium: 8.8 mg/dL — ABNORMAL LOW (ref 8.9–10.3)
Creatinine, Ser: 0.92 mg/dL (ref 0.44–1.00)
GFR calc Af Amer: >60 mL/min (ref >60)
GFR calc non Af Amer: 55 mL/min — ABNORMAL LOW (ref >60)
GLUCOSE: 112 mg/dL — AB (ref 65–99)
POTASSIUM: 3.6 mmol/L (ref 3.5–5.1)
Sodium: 141 mmol/L (ref 135–145)

## 2017-10-30 LAB — GLUCOSE, CAPILLARY
GLUCOSE-CAPILLARY: 82 mg/dL (ref 65–99)
Glucose-Capillary: 100 mg/dL — ABNORMAL HIGH (ref 65–99)
Glucose-Capillary: 166 mg/dL — ABNORMAL HIGH (ref 65–99)
Glucose-Capillary: 91 mg/dL (ref 65–99)

## 2017-10-30 LAB — LACTIC ACID, PLASMA: LACTIC ACID, VENOUS: 1.1 mmol/L (ref 0.5–1.9)

## 2017-10-30 MED ORDER — WARFARIN 1.25 MG HALF TABLET
1.2500 mg | ORAL_TABLET | Freq: Once | ORAL | Status: AC
  Administered 2017-10-30: 1.25 mg via ORAL
  Filled 2017-10-30: qty 1

## 2017-10-30 NOTE — Evaluation (Addendum)
Physical Therapy Evaluation Patient Details Name: [name] [name] MRN: [id number] DOB: 01-05-1930 Today's Date: 10/30/2017   History of Present Illness  84 y.o. female admitted on 10/28/17 for abdominal pain.  Pt dx with UTI, hypokalemia, and A-fib.  CT scan of abdomen/pelvis was negative.  Pt with other significant PMH of DM 2, SIRS, pancreatitis, mitral regurgitation, metabolic encepholopath, HTN, depression, COPD, chronic low back pain, and cholecystectomy.  Clinical Impression  Pt presents very weak with limited activity tolerance due to poor endurance.  She is not safe to return home with her husband as she is at significant risk of falling.  She would benefit from post acute rehab if she qualifies from here and has been to [facility] before.   PT to follow acutely for deficits listed below.       Follow Up Recommendations SNF;Supervision/Assistance - 24 hour    Equipment Recommendations  None recommended by PT    Recommendations for Other Services OT consult     Precautions / Restrictions Precautions Precautions: Fall      Mobility  Bed Mobility Overal bed mobility: Needs Assistance Bed Mobility: Supine to Sit;Sit to Supine     Supine to sit: Min assist;HOB elevated Sit to supine: Mod assist   General bed mobility comments: Min hand held assist to pull up to sitting with HOB mildly elevated ~30 degrees.  Assist needed to lift both legs back up into the bed.   Transfers Overall transfer level: Needs assistance Equipment used: Rolling walker (2 wheeled) Transfers: Sit to/from Stand Sit to Stand: Min assist         General transfer comment: Heavy min assist to support trunk to power up to standing from bed.  Assist needed to control descent to sit and ensure that she is going to sit on her targeted sitting surface.   Ambulation/Gait Ambulation/Gait assistance: Min guard;Mod assist Ambulation Distance (Feet): 50 Feet Assistive device: Rolling walker (2 wheeled) Gait  Pattern/deviations: Step-through pattern;Shuffle(buckling with fatiuge) Gait velocity: decreasd Gait velocity interpretation: Below normal speed for age/gender General Gait Details: Min assist initially to support trunk and help steer RW during gait.  Towards the end of gait as we got closer to the room her bil knees started to buckle, she continued to walk and I encouraged her to sit in the first chair in her room before continuing the last 15' to her bed.  Mod assit for the last 20' of gait to ensure she did not buckle over her LEs.       Balance Overall balance assessment: Needs assistance Sitting-balance support: Feet supported;Bilateral upper extremity supported Sitting balance-Leahy Scale: Poor Sitting balance - Comments: Pt kept falling over to her right elbow as we sat EOB after gait to preform LE HEP.  She reported fatigue and was seemingly falling asleep.  Max verbal cues to sit up during exercises and to continue to preform exercises.  Postural control: Right lateral lean Standing balance support: Bilateral upper extremity supported Standing balance-Leahy Scale: Poor Standing balance comment: needs assist from therapist and RW to stand and walk.                              Pertinent Vitals/Pain Pain Assessment: Faces Faces Pain Scale: Hurts even more Pain Location: abdomen, holding it, grimacing.  Pain Descriptors / Indicators: Grimacing;Guarding Pain Intervention(s): Limited activity within patient's tolerance;Monitored during session;Repositioned    Home Living Family/patient expects to be discharged to:: Private residence  Living Arrangements: Spouse/significant other Available Help at Discharge: Family;Available 24 hours/day Type of Home: House Home Access: Ramped entrance     Home Layout: One level Home Equipment: Walker - 4 wheels      Prior Function Level of Independence: Needs assistance   Gait / Transfers Assistance Needed: PT generally could  walk around the home with rollator unassisted, however, pt's husband reports gradual physical decline in the past year.  She sleeps most of the day and only gets up for a few hours in the middle of the day.  She doesn't go out as much as she used to and won't listen to him when he encourages activity.   ADL's / Homemaking Assistance Needed: she does her own sponge bath and dresses herself.            Extremity/Trunk Assessment   Upper Extremity Assessment Upper Extremity Assessment: Generalized weakness    Lower Extremity Assessment Lower Extremity Assessment: Generalized weakness    Cervical / Trunk Assessment Cervical / Trunk Assessment: Kyphotic  Communication      Cognition Arousal/Alertness: Lethargic Behavior During Therapy: Flat affect Overall Cognitive Status: Impaired/Different from baseline Area of Impairment: Awareness;Problem solving;Memory                     Memory: Decreased short-term memory     Awareness: Intellectual Problem Solving: Slow processing;Difficulty sequencing;Requires tactile cues;Requires verbal cues General Comments: Pt seemed to get confused in the hallway, not knowing where to turn back into her room even after I stated take your next left, more lethargic at the end of the session, falling asleep sitting up.  Not aware that she was about to fall with me several times at the end of our walk (I had to make her sit down as she was completely unaware her legs were about to give out).          Exercises:   10/30/17 1522  General Exercises - Lower Extremity  Long Arc Quad AROM;Both;10 reps  Hip Flexion/Marching AROM;Both;10 reps  Toe Raises AROM;Both;10 reps  Heel Raises AROM;Both;10 reps       Assessment/Plan    PT Assessment Patient needs continued PT services  PT Problem List Decreased strength;Decreased activity tolerance;Decreased balance;Decreased mobility;Decreased knowledge of use of DME;Decreased cognition       PT  Treatment Interventions DME instruction;Gait training;Functional mobility training;Therapeutic activities;Therapeutic exercise;Balance training;Patient/family education    PT Goals (Current goals can be found in the Care Plan section)  Acute Rehab PT Goals Patient Stated Goal: husband wants her to get stronger and move more PT Goal Formulation: With patient/family Time For Goal Achievement: 11/13/17 Potential to Achieve Goals: Good    Frequency Min 3X/week   Barriers to discharge Decreased caregiver support husband is elderly and although stronger than the patient, is getting to the point where he cannot physically handle her.        AM-PAC PT "6 Clicks" Daily Activity  Outcome Measure Difficulty turning over in bed (including adjusting bedclothes, sheets and blankets)?: Unable Difficulty moving from lying on back to sitting on the side of the bed? : Unable Difficulty sitting down on and standing up from a chair with arms (e.g., wheelchair, bedside commode, etc,.)?: Unable Help needed moving to and from a bed to chair (including a wheelchair)?: A Little Help needed walking in hospital room?: A Lot Help needed climbing 3-5 steps with a railing? : A Lot 6 Click Score: 10    End of Session  Activity Tolerance: Patient limited by fatigue Patient left: in bed;with call bell/phone within reach;with family/visitor present;Other (comment)(pt adamately refused to sit up in the recliner chair.) Nurse Communication: Mobility status PT Visit Diagnosis: Muscle weakness (generalized) (M62.81);Difficulty in walking, not elsewhere classified (R26.2)    Time: 3888-7579 PT Time Calculation (min) (ACUTE ONLY): 44 min   Charges:            Wells Guiles B. Sidnie Swalley, PT, DPT [phone]   PT Evaluation $PT Eval Moderate Complexity: 1 Mod PT Treatments $Gait Training: 8-22 mins $Therapeutic Exercise: 8-22 mins   10/30/2017, 3:38 PM

## 2017-10-30 NOTE — Progress Notes (Signed)
PROGRESS NOTE  [name] [name] HYQ:[id number] DOB: Feb 26, 1930 DOA: 10/28/2017 PCP: [name], MD  HPI/Recap of past 24 hours: [name] [name] is a 84 y.o. female with medical history significant of hypertension, hyperlipidemia, diabetes mellitus, COPD, GERD, depression with anxiety, pancreatitis, atrial fibrillation on Coumadin, C. difficile colitis, IBS, who presents with chronic intermittent abdominal pain for the past couple of months, worse over the past 3 weeks. Pain is associated with nausea and occasional vomiting for prolonged time. On admission, pain is located in epigastric region, radiates to her back, 10/10 in intensity. She has occasional loose stool, but has a hx of IBS. Denies fever or chills or any other symptoms. Of note, pt had EGD by Dr. Carlean Purl on 06/02/16, which showed tortuous esophagus and gastritis, neg for H.pylori. Pt denies NSAIDs or ASA or alcohol usage. In the ED, lipase was noted to be WNL. CT abdomen/pelvis showed no acute abnormality. Pt has a hx of cholecystectomy. Patient admitted for further management.  Today, patient seemed to be a little bit feisty/agitated.  Complaining about overall care given to her during her hospital stay.  Denied any epigastric pain, or any other abdominal pain, fever/chills, diarrhea, chest pain, cough, shortness of breath.  Assessment/Plan: Principal Problem:   Abdominal pain Active Problems:   UTI (urinary tract infection)   DM II (diabetes mellitus, type II), controlled (HCC)   HTN (hypertension)   Hypokalemia   Atrial fibrillation    Depression   Anxiety   Encounter for long-term (current) use of anticoagulants   COPD (chronic obstructive pulmonary disease) (HCC)  Abdominal pain Resolving with meds Unknown etiology, likely gastritis. Unlikely pancreatitis, r/o ??mesenteric ischemia, although atypical for epigastric pain. Watch out for any rash, ??shingles (although pain is chronic) Afebrile, no  leukocytosis Lipase WNL, LA WNL CT abdomen/pelvis no acute changes EGD on 06/02/16 by Dr. Carlean Purl, which showed gastritis and tortuous esophagus  May consider CT angio abdomen to r/o mesenteric ischemia if pain persists Discontinue IV protonix 40 mg bid D/C IVF Zofran for nausea prn, 5 mg norco prn  UTI Currently asypmtomatic Repeat UA neg, UC no growth Completed Macrobid for 7 days  Chronic Atrial Fibrillation Rate controlled CHA2DS2-VASc Score is 5 Continue Coumadin, metoprolol and Dilt-Xr Pharm to dose coumadin  DM type II  Last A1c 7.1 on 03/11/14 SSI, hypoglycemic protocol On Lantus and Amaryl at home, will decrease Lantus dose from 22->15 units daily  HTN:  Continue home medications: Metoprolol, Dilt-Xr IV hydralazine prn  Depression and anxiety Stable, no suicidal or homicidal ideations. Continue home medications: Remeron, Ativan  COPD Stable Dulera inhaler and prn albuterol nebs  Hypokalemia Resolved Replace prn    Code Status: Full  Family Communication: Husband at bedside  Disposition Plan: SNF   Consultants:  None  Procedures:  None  Antimicrobials:  None  DVT prophylaxis:  Coumadin   Objective: Vitals:   10/30/17 0522 10/30/17 1015 10/30/17 1031 10/30/17 1549  BP: (!) 149/76  (!) 162/83 (!) 127/55  Pulse: 69  75 82  Resp: 20   12  Temp: (!) 97.5 F (36.4 C)   97.8 F (36.6 C)  TempSrc: Oral   Oral  SpO2: 97% 97%  95%  Weight:      Height:        Intake/Output Summary (Last 24 hours) at 10/30/2017 1713 Last data filed at 10/30/2017 1430 Gross per 24 hour  Intake 340 ml  Output -  Net 340 ml   Filed Weights   10/28/17 2259  Weight: 63.4 kg (139 lb 12.4 oz)    Exam:   General: Alert, awake, oriented, NAD  Cardiovascular: Irregularly irregular, no added heart sounds  Respiratory: Chest clear bilaterally  Abdomen: Soft, no tenderness, nondistended, bowel sounds present  Musculoskeletal: No pedal edema  bilaterally  Skin: Normal  Psychiatry: Normal mood   Data Reviewed: CBC: Recent Labs  Lab 10/24/17 1851 10/28/17 1504 10/29/17 0308 10/30/17 0331  WBC 12.3* 12.5* 10.4 9.0  NEUTROABS  --   --   --  5.0  HGB 15.6* 16.1* 13.7 13.9  HCT 44.8 45.8 39.6 40.5  MCV 89.1 89.6 90.6 92.5  PLT 282 261 217 332   Basic Metabolic Panel: Recent Labs  Lab 10/24/17 1851 10/28/17 1504 10/29/17 0308 10/30/17 0331  NA 139 136 140 141  K 3.2* 3.1* 3.6 3.6  CL 104 106 114* 114*  CO2 21* 14* 15* 19*  GLUCOSE 149* 100* 70 112*  BUN 21* 12 11 9   CREATININE 0.94 0.87 0.77 0.92  CALCIUM 9.5 9.2 8.4* 8.8*  MG  --   --  1.7  --    GFR: Estimated Creatinine Clearance: 37.2 mL/min (by C-G formula based on SCr of 0.92 mg/dL). Liver Function Tests: Recent Labs  Lab 10/24/17 1851 10/28/17 1504  AST 37 52*  ALT 19 28  ALKPHOS 69 69  BILITOT 0.9 1.2  PROT 8.0 7.1  ALBUMIN 4.0 3.8   Recent Labs  Lab 10/24/17 1851 10/28/17 1504  LIPASE 25 33   No results for input(s): AMMONIA in the last 168 hours. Coagulation Profile: Recent Labs  Lab 10/24/17 1851 10/28/17 2325 10/29/17 0308 10/30/17 0331  INR 1.71 1.79 1.87 2.34   Cardiac Enzymes: Recent Labs  Lab 10/24/17 1851  TROPONINI <0.03   BNP (last 3 results) No results for input(s): PROBNP in the last 8760 hours. HbA1C: No results for input(s): HGBA1C in the last 72 hours. CBG: Recent Labs  Lab 10/29/17 1700 10/29/17 2140 10/30/17 0849 10/30/17 1221 10/30/17 1658  GLUCAP 82 147* 100* 166* 91   Lipid Profile: No results for input(s): CHOL, HDL, LDLCALC, TRIG, CHOLHDL, LDLDIRECT in the last 72 hours. Thyroid Function Tests: No results for input(s): TSH, T4TOTAL, FREET4, T3FREE, THYROIDAB in the last 72 hours. Anemia Panel: No results for input(s): VITAMINB12, FOLATE, FERRITIN, TIBC, IRON, RETICCTPCT in the last 72 hours. Urine analysis:    Component Value Date/Time   COLORURINE YELLOW 10/28/2017 1806    APPEARANCEUR CLEAR 10/28/2017 1806   LABSPEC 1.029 10/28/2017 1806   PHURINE 7.0 10/28/2017 1806   GLUCOSEU NEGATIVE 10/28/2017 1806   HGBUR NEGATIVE 10/28/2017 1806   BILIRUBINUR NEGATIVE 10/28/2017 1806   KETONESUR 20 (A) 10/28/2017 1806   PROTEINUR 30 (A) 10/28/2017 1806   UROBILINOGEN 0.2 11/23/2013 1431   NITRITE NEGATIVE 10/28/2017 1806   LEUKOCYTESUR NEGATIVE 10/28/2017 1806   Sepsis Labs: @LABRCNTIP (procalcitonin:4,lacticidven:4)  ) Recent Results (from the past 240 hour(s))  Urine culture     Status: Abnormal   Collection Time: 10/24/17 10:32 PM  Result Value Ref Range Status   Specimen Description URINE, CLEAN CATCH  Final   Special Requests NONE  Final   Culture >=100,000 COLONIES/mL ESCHERICHIA COLI (A)  Final   Report Status 10/27/2017 FINAL  Final   Organism ID, Bacteria ESCHERICHIA COLI (A)  Final      Susceptibility   Escherichia coli - MIC*    AMPICILLIN <=2 SENSITIVE Sensitive     CEFAZOLIN <=4 SENSITIVE Sensitive     CEFTRIAXONE <=1 SENSITIVE  Sensitive     CIPROFLOXACIN <=0.25 SENSITIVE Sensitive     GENTAMICIN <=1 SENSITIVE Sensitive     IMIPENEM <=0.25 SENSITIVE Sensitive     NITROFURANTOIN 32 SENSITIVE Sensitive     TRIMETH/SULFA <=20 SENSITIVE Sensitive     AMPICILLIN/SULBACTAM <=2 SENSITIVE Sensitive     PIP/TAZO <=4 SENSITIVE Sensitive     Extended ESBL NEGATIVE Sensitive     * >=100,000 COLONIES/mL ESCHERICHIA COLI  Urine Culture     Status: None   Collection Time: 10/28/17  6:06 PM  Result Value Ref Range Status   Specimen Description URINE, RANDOM  Final   Special Requests NONE  Final   Culture NO GROWTH  Final   Report Status 10/30/2017 FINAL  Final      Studies: No results found.  Scheduled Meds: . dicyclomine  10 mg Oral TID AC  . diltiazem  120 mg Oral Daily  . gabapentin  100 mg Oral BID  . insulin aspart  0-9 Units Subcutaneous TID WC  . insulin glargine  15 Units Subcutaneous Daily  . latanoprost  1 drop Both Eyes QHS  .  LORazepam  1 mg Oral TID  . methylphenidate  5 mg Oral Daily  . metoprolol tartrate  12.5 mg Oral BID  . mirtazapine  7.5 mg Oral QHS  . mometasone-formoterol  2 puff Inhalation BID  . multivitamin with minerals  1 tablet Oral Daily  . oxybutynin  5 mg Oral QHS  . pantoprazole (PROTONIX) IV  40 mg Intravenous Q12H  . timolol  1 drop Both Eyes Daily  . Warfarin - Pharmacist Dosing Inpatient   Does not apply q1800    Continuous Infusions:    LOS: 0 days     Alma Friendly, MD [facility]   If 7PM-7AM, please contact night-coverage [email or web address] Password [facility] 10/30/2017, 5:13 PM

## 2017-10-30 NOTE — Progress Notes (Signed)
ANTICOAGULATION CONSULT NOTE - Follow-Up Consult  Pharmacy Consult for Coumadin Indication: atrial fibrillation  Patient Measurements: Height: 5' (152.4 cm) Weight: 139 lb 12.4 oz (63.4 kg) IBW/kg (Calculated) : 45.5  Vital Signs: Temp: 97.5 F (36.4 C) (01/26 0522) Temp Source: Oral (01/26 0522) BP: 162/83 (01/26 1031) Pulse Rate: 75 (01/26 1031)  Labs: Recent Labs    10/28/17 1504 10/28/17 2325 10/29/17 0308 10/30/17 0331  HGB 16.1*  --  13.7 13.9  HCT 45.8  --  39.6 40.5  PLT 261  --  217 194  LABPROT  --  20.6* 21.3* 25.5*  INR  --  1.79 1.87 2.34  CREATININE 0.87  --  0.77 0.92    Estimated Creatinine Clearance: 37.2 mL/min (by C-G formula based on SCr of 0.92 mg/dL).   Assessment: 84 yo female c/o abdominal pain radiating to back, PCP sent pt to ED, admitted for further eval, to continue Coumadin for Afib. Admit INR 1.79 on PTA dose.     INR today is therapeutic (INR 2.34 << 1.87, goal of 2-3), CBC wnl - no bleeding noted. Pt still with poor po intake - will continue with a reduced dose this evening.   PTA dose noted to be 2.5 mg daily EXCEPT for 1.25 mg on Wed/Sun  Goal of Therapy:  INR 2-3   Plan:  - Warfarin 1.25 mg x 1 dose at 1800 today - Will continue to monitor for any signs/symptoms of bleeding and will follow up with PT/INR in the a.m.   Thank you for allowing pharmacy to be a part of this patient's care.  Alycia Rossetti, PharmD, BCPS Clinical Pharmacist Pager: [phone] Clinical phone for 10/30/2017 from 7a-3:30p: [phone] If after 3:30p, please call main pharmacy at: [phone] 10/30/2017 11:41 AM

## 2017-10-31 DIAGNOSIS — Z7901 Long term (current) use of anticoagulants: Secondary | ICD-10-CM | POA: Diagnosis not present

## 2017-10-31 DIAGNOSIS — J449 Chronic obstructive pulmonary disease, unspecified: Secondary | ICD-10-CM | POA: Diagnosis not present

## 2017-10-31 DIAGNOSIS — E876 Hypokalemia: Secondary | ICD-10-CM | POA: Diagnosis not present

## 2017-10-31 DIAGNOSIS — N3000 Acute cystitis without hematuria: Secondary | ICD-10-CM | POA: Diagnosis not present

## 2017-10-31 DIAGNOSIS — I10 Essential (primary) hypertension: Secondary | ICD-10-CM | POA: Diagnosis not present

## 2017-10-31 DIAGNOSIS — R1013 Epigastric pain: Secondary | ICD-10-CM | POA: Diagnosis not present

## 2017-10-31 DIAGNOSIS — F419 Anxiety disorder, unspecified: Secondary | ICD-10-CM | POA: Diagnosis not present

## 2017-10-31 DIAGNOSIS — I4891 Unspecified atrial fibrillation: Secondary | ICD-10-CM | POA: Diagnosis not present

## 2017-10-31 LAB — PROTIME-INR
INR: 2.14
PROTHROMBIN TIME: 23.8 s — AB (ref 11.4–15.2)

## 2017-10-31 LAB — GLUCOSE, CAPILLARY
GLUCOSE-CAPILLARY: 88 mg/dL (ref 65–99)
GLUCOSE-CAPILLARY: 143 mg/dL — AB (ref 65–99)
GLUCOSE-CAPILLARY: 133 mg/dL — AB (ref 65–99)
Glucose-Capillary: 96 mg/dL (ref 65–99)

## 2017-10-31 MED ORDER — PANTOPRAZOLE SODIUM 40 MG PO TBEC
40.0000 mg | DELAYED_RELEASE_TABLET | Freq: Two times a day (BID) | ORAL | Status: DC
  Administered 2017-10-31 – 2017-11-01 (×3): 40 mg via ORAL
  Filled 2017-10-31 (×3): qty 1

## 2017-10-31 MED ORDER — WARFARIN SODIUM 2.5 MG PO TABS
2.5000 mg | ORAL_TABLET | Freq: Once | ORAL | Status: AC
  Administered 2017-10-31: 2.5 mg via ORAL
  Filled 2017-10-31: qty 1

## 2017-10-31 MED ORDER — INSULIN GLARGINE 100 UNIT/ML ~~LOC~~ SOLN
10.0000 [IU] | Freq: Every day | SUBCUTANEOUS | Status: DC
  Administered 2017-11-01: 10 [IU] via SUBCUTANEOUS
  Filled 2017-10-31: qty 0.1

## 2017-10-31 NOTE — Care Management Note (Signed)
Case Management Note  Patient Details  Name: [name] [name] MRN: [id number] Date of Birth: January 11, 1930  Subjective/Objective:     Pt presented for abd pain. Pt from home with husband and was walking with walker but spent most of time in the bed.  Husband describes AMS progressing since admission.  Pt also less mobile since admission.   PT/OT recommend SNF at d/c as pt is not improving and husband can only offer limited help.  Husband states there is family available intermittantly, but he believes pt would be better served at [facility].  Pt has stayed at [facility] before and they want her to return there.          Action/Plan: CSW referral made and d/w [facility].    Expected Discharge Date:                  Expected Discharge Plan:  Skilled Nursing Facility  In-House Referral:  Clinical Social Work  Discharge planning Services  CM Consult  Post Acute Care Choice:  NA Choice offered to:     DME Arranged:    DME Agency:     HH Arranged:    [place] Agency:     Status of Service:  In process, will continue to follow  If discussed at Long Length of Stay Meetings, dates discussed:    Additional Comments:  Arley Phenix, RN 10/31/2017, 11:43 AM

## 2017-10-31 NOTE — Clinical Social Work Note (Signed)
Clinical Social Work Assessment  Patient Details  Name: [name] [name] MRN: [id number] Date of Birth: 1929/11/22  Date of referral:  10/31/17               Reason for consult:  Facility Placement                Permission sought to share information with:  Case Manager, Customer service manager, Family Supports Permission granted to share information::  Yes, Verbal Permission Granted  Name::     [name]  Agency::  yes  Relationship::  [name], spouse  Contact Information:  yes  Housing/Transportation Living arrangements for the past 2 months:  Single Family Home Source of Information:  Spouse Patient Interpreter Needed:  None Criminal Activity/Legal Involvement Pertinent to Current Situation/Hospitalization:  No - Comment as needed Significant Relationships:  Spouse, Other Family Members Lives with:  Spouse Do you feel safe going back to the place where you live?  Yes Need for family participation in patient care:  Yes (Comment)  Care giving concerns: CSW received consult regarding SNF placement.  CSW spoke with pt;s spouse.  Pt resides at home.  Pt's spouse would like for pt to  Reside at SNF until pt is stronger.   Social Worker assessment / plan:  CSW spoke with pt's spouse regarding SNF placement.  Pt's spouse are agreeable for placement.  Spouse does have concerns with insurance and number of days for SNF placement.  Employment status:  Retired Advertising copywriter PT Recommendations:  [place] / Referral to community resources:  (NA)  Patient/Family's Response to care:  Pt's spouse are in agreement with discharge plan.  Patient/Family's Understanding of and Emotional Response to Diagnosis, Current Treatment, and Prognosis:  Pt's spouse is realistic regarding therapy needs and expressed being hopeful for SNF placement.  Pt's spouse expressed understanding of CSW role and discharge process as well as medical condition.   No questions/concerns about plan or treatment.  Emotional Assessment Appearance:  Appears stated age Attitude/Demeanor/Rapport:  Unable to Assess(Pt sleeping) Affect (typically observed):  Unable to Assess(pt sleeping) Orientation:  Oriented to Self Alcohol / Substance use:  Not Applicable Psych involvement (Current and /or in the community):  No (Comment)  Discharge Needs  Concerns to be addressed:  Basic Needs Readmission within the last 30 days:  No Current discharge risk:  None Barriers to Discharge:  No Barriers Identified   Carolin Sicks, LCSWA 10/31/2017, 2:09 PM

## 2017-10-31 NOTE — Progress Notes (Signed)
Physical Therapy Treatment Patient Details Name: [name] [name] MRN: [id number] DOB: [birth year] Today's Date: 10/31/2017    History of Present Illness 84 y.o. female admitted on 10/28/17 for abdominal pain.  Pt dx with UTI, hypokalemia, and A-fib.  CT scan of abdomen/pelvis was negative.  Pt with other significant PMH of DM 2, SIRS, pancreatitis, mitral regurgitation, metabolic encepholopath, HTN, depression, COPD, chronic low back pain, and cholecystectomy.    PT Comments    Pt's cognition seems more impaired today and had issues with elevated BP.  Discussed findings with husband who does not feel pt is at her baseline and that he can't care for her in the current condition. Continue to recommend SNF.      Follow Up Recommendations  SNF;Supervision/Assistance - 24 hour     Equipment Recommendations  None recommended by PT    Recommendations for Other Services       Precautions / Restrictions Precautions Precautions: Fall Restrictions Weight Bearing Restrictions: No    Mobility  Bed Mobility Overal bed mobility: Needs Assistance Bed Mobility: Supine to Sit;Sit to Supine     Supine to sit: HOB elevated;Mod assist Sit to supine: Min guard   General bed mobility comments: total assist to scoot up in bed  Transfers Overall transfer level: (NT this session)               General transfer comment: limited by increased blood pressure this session (initial sitting BP: 159/94, after 5 min 160/111 - RN notified)  Ambulation/Gait Ambulation/Gait assistance: (NT due to BP increase)               Stairs            Wheelchair Mobility    Modified Rankin (Stroke Patients Only)       Balance Overall balance assessment: Needs assistance Sitting-balance support: Feet supported;Bilateral upper extremity supported Sitting balance-Leahy Scale: Poor Sitting balance - Comments: Pt kept falling over to her right elbow as we sat EOB she was seemingly falling  asleep and confused she required assist to maintain sitting safely Postural control: Right lateral lean                                  Cognition Arousal/Alertness: Lethargic Behavior During Therapy: Flat affect Overall Cognitive Status: Impaired/Different from baseline Area of Impairment: Awareness;Problem solving;Memory;Orientation;Following commands;Safety/judgement                 Orientation Level: Disoriented to;Place;Time;Situation   Memory: Decreased short-term memory Following Commands: Follows one step commands inconsistently Safety/Judgement: Decreased awareness of safety;Decreased awareness of deficits Awareness: Intellectual Problem Solving: Slow processing;Difficulty sequencing;Requires tactile cues;Requires verbal cues General Comments: husband reports this is not her baseline cognition      Exercises General Exercises - Lower Extremity Short Arc Quad: AROM;Both(about 3 reps each. Difficult to keep pt on task) Hip Flexion/Marching: AROM;Both;Supine(2 reps as difficult to keep pt on task.)    General Comments General comments (skin integrity, edema, etc.): See OT note for details of patients BP. OT was working with pt and requested PT to assist with functional mobility due to patients physical and cognitive impairments.        Pertinent Vitals/Pain Pain Assessment: Faces Faces Pain Scale: Hurts little more Pain Location: abdomen, left distal arm Pain Descriptors / Indicators: Grimacing;Guarding Pain Intervention(s): Monitored during session;Limited activity within patient's tolerance    Home Living Family/patient expects to be discharged to:: Private  residence Living Arrangements: Spouse/significant other Available Help at Discharge: Family;Available 24 hours/day Type of Home: House Home Access: Ramped entrance   Home Layout: One level Home Equipment: Walker - 4 wheels      Prior Function Level of Independence: Needs assistance  .       PT Goals (current goals can now be found in the care plan section) Acute Rehab PT Goals Patient Stated Goal: husband would like for her to come once she is at a point he can care for her safely Progress towards PT goals: Not progressing toward goals - comment(Cognition likely worse today and medical issues wiith BP)    Frequency    Min 3X/week      PT Plan Current plan remains appropriate    Co-evaluation   Reason for Co-Treatment: For patient/therapist safety;To address functional/ADL transfers   PT goals addressed during session: Strengthening/ROM      AM-PAC PT "6 Clicks" Daily Activity  Outcome Measure  Difficulty turning over in bed (including adjusting bedclothes, sheets and blankets)?: Unable Difficulty moving from lying on back to sitting on the side of the bed? : Unable Difficulty sitting down on and standing up from a chair with arms (e.g., wheelchair, bedside commode, etc,.)?: Unable Help needed moving to and from a bed to chair (including a wheelchair)?: A Lot Help needed walking in hospital room?: A Lot Help needed climbing 3-5 steps with a railing? : Total 6 Click Score: 8    End of Session   Activity Tolerance: Treatment limited secondary to medical complications (Comment) Patient left: in bed;with call bell/phone within reach;with bed alarm set;with family/visitor present Nurse Communication: Mobility status;Other (comment)(BP concerns) PT Visit Diagnosis: Muscle weakness (generalized) (M62.81);Difficulty in walking, not elsewhere classified (R26.2)     Time: 0981-1914 PT Time Calculation (min) (ACUTE ONLY): 23 min  Charges:  $Therapeutic Exercise: 8-22 mins                    G CodesLavonia [name], PT  [phone] 10/31/2017    [name] [name] 10/31/2017, 1:17 PM

## 2017-10-31 NOTE — Progress Notes (Signed)
PROGRESS NOTE  [name] [name] WUJ:[id number] DOB: December 16, 1929 DOA: 10/28/2017 PCP: [name], MD  HPI/Recap of past 24 hours: [name] [name] is a 84 y.o. female with medical history significant of hypertension, hyperlipidemia, diabetes mellitus, COPD, GERD, depression with anxiety, pancreatitis, atrial fibrillation on Coumadin, C. difficile colitis, IBS, who presents with chronic intermittent abdominal pain for the past couple of months, worse over the past 3 weeks. Pain is associated with nausea and occasional vomiting for prolonged time. On admission, pain is located in epigastric region, radiates to her back, 10/10 in intensity. She has occasional loose stool, but has a hx of IBS. Denies fever or chills or any other symptoms. Of note, pt had EGD by Dr. Carlean Purl on 06/02/16, which showed tortuous esophagus and gastritis, neg for H.pylori. Pt denies NSAIDs or ASA or alcohol usage. In the ED, lipase was noted to be WNL. CT abdomen/pelvis showed no acute abnormality. Pt has a hx of cholecystectomy. Patient admitted for further management.  Today, met patient enjoying her breakfast, denies any new complaints, denies any chest pain, abdominal pain, nausea/vomiting, fever/chills.  Husband at bedside encouraging patient.  Plan to discharge to SNF  Assessment/Plan: Principal Problem:   Abdominal pain Active Problems:   UTI (urinary tract infection)   DM II (diabetes mellitus, type II), controlled (HCC)   HTN (hypertension)   Hypokalemia   Atrial fibrillation    Depression   Anxiety   Encounter for long-term (current) use of anticoagulants   COPD (chronic obstructive pulmonary disease) (HCC)  Abdominal pain Resolved Unknown etiology, likely gastritis. Unlikely pancreatitis, r/o ??mesenteric ischemia, although atypical for epigastric pain. Watch out for any rash, ??shingles (although pain is chronic) Afebrile, no leukocytosis Lipase WNL, LA WNL CT abdomen/pelvis no acute  changes EGD on 06/02/16 by Dr. Carlean Purl, which showed gastritis and tortuous esophagus  PO protonix 40 mg bid D/C IVF Zofran for nausea prn, 5 mg norco prn  UTI Currently asypmtomatic Repeat UA neg, UC no growth Completed Macrobid for 7 days  Chronic Atrial Fibrillation Rate controlled CHA2DS2-VASc Score is 5 Continue Coumadin, metoprolol and Dilt-Xr Pharm to dose coumadin  DM type II  Last A1c 7.1 on 03/11/14 SSI, hypoglycemic protocol On Lantus and Amaryl at home, will decrease Lantus dose from 22->15 units daily  HTN:  Continue home medications: Metoprolol, Dilt-Xr IV hydralazine prn  Depression and anxiety Stable, no suicidal or homicidal ideations. Continue home medications: Remeron, Ativan  COPD Stable Dulera inhaler and prn albuterol nebs  Hypokalemia Resolved Replace prn    Code Status: Full  Family Communication: Husband at bedside  Disposition Plan: SNF   Consultants:  None  Procedures:  None  Antimicrobials:  None  DVT prophylaxis:  Coumadin   Objective: Vitals:   10/31/17 1030 10/31/17 1035 10/31/17 1050 10/31/17 1437  BP: (!) 159/94 (!) 160/111 (!) 146/85 (!) 122/54  Pulse:    80  Resp:    14  Temp:    98.3 F (36.8 C)  TempSrc:    Oral  SpO2:    95%  Weight:      Height:        Intake/Output Summary (Last 24 hours) at 10/31/2017 1612 Last data filed at 10/31/2017 1500 Gross per 24 hour  Intake 640 ml  Output 1600 ml  Net -960 ml   Filed Weights   10/28/17 2259  Weight: 63.4 kg (139 lb 12.4 oz)    Exam:   General: Alert, awake, oriented, NAD  Cardiovascular: Irregularly irregular, no added heart  sounds  Respiratory: Chest clear bilaterally  Abdomen: Soft, no tenderness, nondistended, bowel sounds present  Musculoskeletal: No pedal edema bilaterally  Skin: Normal  Psychiatry: Normal mood   Data Reviewed: CBC: Recent Labs  Lab 10/24/17 1851 10/28/17 1504 10/29/17 0308 10/30/17 0331  WBC 12.3*  12.5* 10.4 9.0  NEUTROABS  --   --   --  5.0  HGB 15.6* 16.1* 13.7 13.9  HCT 44.8 45.8 39.6 40.5  MCV 89.1 89.6 90.6 92.5  PLT 282 261 217 025   Basic Metabolic Panel: Recent Labs  Lab 10/24/17 1851 10/28/17 1504 10/29/17 0308 10/30/17 0331  NA 139 136 140 141  K 3.2* 3.1* 3.6 3.6  CL 104 106 114* 114*  CO2 21* 14* 15* 19*  GLUCOSE 149* 100* 70 112*  BUN 21* '12 11 9  '$ CREATININE 0.94 0.87 0.77 0.92  CALCIUM 9.5 9.2 8.4* 8.8*  MG  --   --  1.7  --    GFR: Estimated Creatinine Clearance: 37.2 mL/min (by C-G formula based on SCr of 0.92 mg/dL). Liver Function Tests: Recent Labs  Lab 10/24/17 1851 10/28/17 1504  AST 37 52*  ALT 19 28  ALKPHOS 69 69  BILITOT 0.9 1.2  PROT 8.0 7.1  ALBUMIN 4.0 3.8   Recent Labs  Lab 10/24/17 1851 10/28/17 1504  LIPASE 25 33   No results for input(s): AMMONIA in the last 168 hours. Coagulation Profile: Recent Labs  Lab 10/24/17 1851 10/28/17 2325 10/29/17 0308 10/30/17 0331 10/31/17 0227  INR 1.71 1.79 1.87 2.34 2.14   Cardiac Enzymes: Recent Labs  Lab 10/24/17 1851  TROPONINI <0.03   BNP (last 3 results) No results for input(s): PROBNP in the last 8760 hours. HbA1C: No results for input(s): HGBA1C in the last 72 hours. CBG: Recent Labs  Lab 10/30/17 1221 10/30/17 1658 10/30/17 2143 10/31/17 0819 10/31/17 1157  GLUCAP 166* 91 82 88 143*   Lipid Profile: No results for input(s): CHOL, HDL, LDLCALC, TRIG, CHOLHDL, LDLDIRECT in the last 72 hours. Thyroid Function Tests: No results for input(s): TSH, T4TOTAL, FREET4, T3FREE, THYROIDAB in the last 72 hours. Anemia Panel: No results for input(s): VITAMINB12, FOLATE, FERRITIN, TIBC, IRON, RETICCTPCT in the last 72 hours. Urine analysis:    Component Value Date/Time   COLORURINE YELLOW 10/28/2017 1806   APPEARANCEUR CLEAR 10/28/2017 1806   LABSPEC 1.029 10/28/2017 1806   PHURINE 7.0 10/28/2017 1806   GLUCOSEU NEGATIVE 10/28/2017 1806   HGBUR NEGATIVE 10/28/2017  1806   BILIRUBINUR NEGATIVE 10/28/2017 1806   KETONESUR 20 (A) 10/28/2017 1806   PROTEINUR 30 (A) 10/28/2017 1806   UROBILINOGEN 0.2 11/23/2013 1431   NITRITE NEGATIVE 10/28/2017 1806   LEUKOCYTESUR NEGATIVE 10/28/2017 1806   Sepsis Labs: '@LABRCNTIP'$ (procalcitonin:4,lacticidven:4)  ) Recent Results (from the past 240 hour(s))  Urine culture     Status: Abnormal   Collection Time: 10/24/17 10:32 PM  Result Value Ref Range Status   Specimen Description URINE, CLEAN CATCH  Final   Special Requests NONE  Final   Culture >=100,000 COLONIES/mL ESCHERICHIA COLI (A)  Final   Report Status 10/27/2017 FINAL  Final   Organism ID, Bacteria ESCHERICHIA COLI (A)  Final      Susceptibility   Escherichia coli - MIC*    AMPICILLIN <=2 SENSITIVE Sensitive     CEFAZOLIN <=4 SENSITIVE Sensitive     CEFTRIAXONE <=1 SENSITIVE Sensitive     CIPROFLOXACIN <=0.25 SENSITIVE Sensitive     GENTAMICIN <=1 SENSITIVE Sensitive     IMIPENEM <=  0.25 SENSITIVE Sensitive     NITROFURANTOIN 32 SENSITIVE Sensitive     TRIMETH/SULFA <=20 SENSITIVE Sensitive     AMPICILLIN/SULBACTAM <=2 SENSITIVE Sensitive     PIP/TAZO <=4 SENSITIVE Sensitive     Extended ESBL NEGATIVE Sensitive     * >=100,000 COLONIES/mL ESCHERICHIA COLI  Urine Culture     Status: None   Collection Time: 10/28/17  6:06 PM  Result Value Ref Range Status   Specimen Description URINE, RANDOM  Final   Special Requests NONE  Final   Culture NO GROWTH  Final   Report Status 10/30/2017 FINAL  Final      Studies: No results found.  Scheduled Meds: . dicyclomine  10 mg Oral TID AC  . diltiazem  120 mg Oral Daily  . gabapentin  100 mg Oral BID  . insulin aspart  0-9 Units Subcutaneous TID WC  . insulin glargine  15 Units Subcutaneous Daily  . latanoprost  1 drop Both Eyes QHS  . LORazepam  1 mg Oral TID  . methylphenidate  5 mg Oral Daily  . metoprolol tartrate  12.5 mg Oral BID  . mirtazapine  7.5 mg Oral QHS  . mometasone-formoterol  2  puff Inhalation BID  . multivitamin with minerals  1 tablet Oral Daily  . oxybutynin  5 mg Oral QHS  . pantoprazole  40 mg Oral BID  . timolol  1 drop Both Eyes Daily  . warfarin  2.5 mg Oral ONCE-1800  . Warfarin - Pharmacist Dosing Inpatient   Does not apply q1800    Continuous Infusions:    LOS: 0 days     Alma Friendly, MD [facility]   If 7PM-7AM, please contact night-coverage [email or web address] Password [facility] 10/31/2017, 4:12 PM

## 2017-10-31 NOTE — Progress Notes (Signed)
OT Evaluation:  Clinical Impression: Pt seen today for OT eval, lethargic and presenting with increased confusion, only oriented to self this session. Pt would go back and forth between telling OT "you're my angel" and "I'm going to hit you!" Pt also with high blood pressure limiting ability to transfer this session. PT called into session halfway for additional clinical expertise and opinion for safety of Pt. Pt is overall mod A (at least) for ADL this session. Pt should continue to receive skilled OT in the acute setting and for safety should go and get SNF level therapy as she continues to present as a major fall risk.   Would it be possible to get a palliative consultation due to decline over the past year, age, and ability of Palliative to look at the patient holistically and provide support for the family as well?   10/31/17 1100  OT Visit Information  Last OT Received On 10/31/17  Assistance Needed +2  PT/OT/SLP Co-Evaluation/Treatment Yes  Reason for Co-Treatment For patient/therapist safety (Pt brought in for 2nd opinion and for safety mid-session)  OT goals addressed during session ADL's and self-care;Strengthening/ROM  History of Present Illness 84 y.o. female admitted on 10/28/17 for abdominal pain.  Pt dx with UTI, hypokalemia, and A-fib.  CT scan of abdomen/pelvis was negative.  Pt with other significant PMH of DM 2, SIRS, pancreatitis, mitral regurgitation, metabolic encepholopath, HTN, depression, COPD, chronic low back pain, and cholecystectomy.  Precautions  Precautions Fall  Restrictions  Weight Bearing Restrictions No  Home Living  Family/patient expects to be discharged to: Private residence  Living Arrangements Spouse/significant other  Available Help at Discharge Family;Available 24 hours/day  Type of [place] One level  Bathroom Shower/Tub (pt only sponge bathes)  Bathroom Toilet Handicapped height  Bathroom Accessibility  Yes  How Accessible Accessible via walker  [place] - 4 wheels  Prior Function  Level of Independence Needs assistance  Gait / Transfers Assistance Needed Pt generally could walk around the home with rollator unassisted, however, pt's husband reports gradual physical decline in the past year.  She sleeps most of the day and only gets up for a few hours in the middle of the day.  She doesn't go out as much as she used to and won't listen to him when he encourages activity.   ADL's / Homemaking Assistance Needed she does her own sponge bath and dresses herself.   Communication  Communication No difficulties  Pain Assessment  Pain Assessment Faces  Faces Pain Scale 4  Pain Location abdomen  Pain Descriptors / Indicators Grimacing;Guarding  Pain Intervention(s) Monitored during session;Repositioned;Limited activity within patient's tolerance  Cognition  Arousal/Alertness Lethargic  Behavior During Therapy Flat affect  Overall Cognitive Status Impaired/Different from baseline  Area of Impairment Awareness;Problem solving;Memory;Orientation;Following commands;Safety/judgement  Orientation Level Place;Time;Situation  Memory Decreased short-term memory  Following Commands Follows one step commands inconsistently  Safety/Judgement Decreased awareness of safety;Decreased awareness of deficits  Awareness Intellectual  Problem Solving Slow processing;Difficulty sequencing;Requires tactile cues;Requires verbal cues  General Comments Pt thought she was at girl [employer] camp, and at home. Pt was told multiple times where she was, and who the therapist was.   Upper Extremity Assessment  Upper Extremity Assessment Generalized weakness  Lower Extremity Assessment  Lower Extremity Assessment Generalized weakness  Cervical / Trunk Assessment  Cervical / Trunk Assessment Kyphotic  ADL  Overall ADL's  Needs assistance/impaired  Eating/Feeding Minimal assistance  Grooming Moderate  assistance   Upper Body Bathing Moderate assistance  Lower Body Bathing Maximal assistance  Upper Body Dressing  Maximal assistance  Lower Body Dressing Total assistance  Toilet Transfer Maximal assistance  Toileting- Clothing Manipulation and Hygiene Total assistance  General ADL Comments Pt with limited ability to perform ANY ADL at this time due to deficits in cognition and balance. She declined attempting to eat, but required assist to sit EOB. The above are clinical judgements.  Bed Mobility  Overal bed mobility Needs Assistance  Bed Mobility Supine to Sit;Sit to Supine  Supine to sit HOB elevated;Mod assist (45 degrees)  Sit to supine Min guard  General bed mobility comments Min hand held assist to pull up to sitting with HOB at 45 degrees.  multimodal cues to get legs back in bed - however Pt able to achieve this without physical assist. Pt required total A for re-adjustment in the bed  Transfers  General transfer comment limited by increased blood pressure this session (initial sitting BP: 159/94, after 5 min 160/111 - RN notified)  Balance  Overall balance assessment Needs assistance  Sitting-balance support Feet supported;Bilateral upper extremity supported  Sitting balance-Leahy Scale Poor  Sitting balance - Comments Pt kept falling over to her right elbow as we sat EOB she was seemingly falling asleep and confused she required assist to maintain sitting safely  Postural control Right lateral lean  General Comments  General comments (skin integrity, edema, etc.) Please see vital signs information for BP during session; husband present at EOS with case manager and PT to discuss planning for safe discharge  OT - End of Session  Activity Tolerance Patient limited by lethargy;Treatment limited secondary to medical complications (Comment) (high BP (see vitals))  Patient left in bed;with call bell/phone within reach;with bed alarm set;with family/visitor present  Nurse Communication Mobility  status;Other (comment) (check purewick, BP addressed in session)  OT Assessment  OT Recommendation/Assessment Patient needs continued OT Services  OT Visit Diagnosis Unsteadiness on feet (R26.81);Other abnormalities of gait and mobility (R26.89);Repeated falls (R29.6);History of falling (Z91.81);Muscle weakness (generalized) (M62.81);Other symptoms and signs involving cognitive function  OT Problem List Decreased strength;Decreased activity tolerance;Decreased range of motion;Impaired balance (sitting and/or standing);Decreased cognition;Decreased safety awareness;Decreased knowledge of use of DME or AE;Pain  OT Plan  OT Frequency (ACUTE ONLY) Min 2X/week  OT Treatment/Interventions (ACUTE ONLY) Self-care/ADL training;Therapeutic exercise;DME and/or AE instruction;Energy conservation;Therapeutic activities;Cognitive remediation/compensation;Patient/family education;Balance training  AM-PAC OT "6 Clicks" Daily Activity Outcome Measure  Help from another person eating meals? 2  Help from another person taking care of personal grooming? 2  Help from another person toileting, which includes using toliet, bedpan, or urinal? 1  Help from another person bathing (including washing, rinsing, drying)? 2  Help from another person to put on and taking off regular upper body clothing? 2  Help from another person to put on and taking off regular lower body clothing? 1  6 Click Score 10  ADL G Code Conversion CL  OT Recommendation  Follow Up Recommendations SNF;Supervision/Assistance - 24 hour  OT Equipment Other (comment);3 in 1 bedside commode (defer to next venue)  Individuals Consulted  Consulted and Agree with Results and Recommendations Family member/caregiver  Family Member Consulted Husband  Acute Rehab OT Goals  Patient Stated Goal husband wants her to get stronger and move more  OT Goal Formulation With family  Time For Goal Achievement 11/14/17  Potential to Achieve Goals Fair  OT Time  Calculation  OT Start Time (ACUTE ONLY) 3536  OT Stop Time (ACUTE ONLY) 1052  OT Time Calculation (min) 55 min  OT G-codes **NOT FOR INPATIENT CLASS**  Functional Assessment Tool Used AM-PAC 6 Clicks Daily Activity  Functional Limitation Self care  Self Care Current Status (T6144) CL  Self Care Goal Status (R1540) CK  OT General Charges  $OT Visit 1 Visit  OT Evaluation  $OT Eval Moderate Complexity 1 Mod  OT Treatments  $Self Care/Home Management  23-37 mins  Written Expression  Dominant Hand Right   Hulda Humphrey OTR/L [phone]

## 2017-10-31 NOTE — Progress Notes (Signed)
ANTICOAGULATION CONSULT NOTE - [facility] for Coumadin Indication: atrial fibrillation  Patient Measurements: Height: 5' (152.4 cm) Weight: 139 lb 12.4 oz (63.4 kg) IBW/kg (Calculated) : 45.5  Vital Signs: Temp: 97.5 F (36.4 C) (01/27 0551) Temp Source: Oral (01/27 0551) BP: 146/85 (01/27 1050) Pulse Rate: 69 (01/27 0914)  Labs: Recent Labs    10/28/17 1504  10/29/17 0308 10/30/17 0331 10/31/17 0227  HGB 16.1*  --  13.7 13.9  --   HCT 45.8  --  39.6 40.5  --   PLT 261  --  217 194  --   LABPROT  --    < > 21.3* 25.5* 23.8*  INR  --    < > 1.87 2.34 2.14  CREATININE 0.87  --  0.77 0.92  --    < > = values in this interval not displayed.    Estimated Creatinine Clearance: 37.2 mL/min (by C-G formula based on SCr of 0.92 mg/dL).   Assessment: 84 yo female c/o abdominal pain radiating to back, PCP sent pt to ED, admitted for further eval, to continue Coumadin for Afib. Admit INR 1.79 on PTA dose.     INR today remains therapeutic (INR 2.14 << 2.34, goal of 2-3), No CBC today - was wnl on 1/26. No bleeding noted. Po intake appears to be improving.   PTA dose noted to be 2.5 mg daily EXCEPT for 1.25 mg on Wed/Sun  Goal of Therapy:  INR 2-3   Plan:  - Warfarin 2.5 mg x 1 dose at 1800 today - Will continue to monitor for any signs/symptoms of bleeding and will follow up with PT/INR in the a.m.   Thank you for allowing pharmacy to be a part of this patient's care.  Alycia Rossetti, PharmD, BCPS Clinical Pharmacist Pager: [phone] Clinical phone for 10/31/2017 from 7a-3:30p: [phone] If after 3:30p, please call main pharmacy at: [phone] 10/31/2017 11:44 AM

## 2017-10-31 NOTE — Discharge Instructions (Signed)

## 2017-10-31 NOTE — Progress Notes (Addendum)
CSW received consult for [facility] for disposition plan.  CSW spoke with pt's spouse. According to pt's husband pt is listed under observation.  [name] will take pt home with home health in place and possible personal care services.  No further needs are noted.  CSW signing off.  CSW consulted with pt's spouse and RNCM on pt's disposition planning.  Pt's spouse would like for pt to receive services from SNF.   Reed Breech LCSWA [phone]

## 2017-10-31 NOTE — [place] FL2 (Signed)
[place] MEDICAID FL2 LEVEL OF CARE SCREENING TOOL     IDENTIFICATION  Patient Name: [name] [name] Birthdate: [birth year] Sex: female Admission Date (Current Location): 10/28/2017  [facility] and [place] Number:  Herbalist and Address:  [facility], [place] [address], [place], [place] [address]      Provider Number: [id number]  Attending Physician Name and Address:  Alma Friendly, MD  Relative Name and Phone Number:  [name] [phone]    Current Level of Care: Hospital Recommended Level of Care: [place] Prior Approval Number:    Date Approved/Denied:   PASRR Number: [id number]  Discharge Plan: SNF    Current Diagnoses: Patient Active Problem List   Diagnosis Date Noted  . COPD (chronic obstructive pulmonary disease) ([place]) 10/28/2017  . RLQ abdominal pain 04/24/2014  . Nausea alone 04/11/2014  . Abdominal pain 08/05/2012    Class: Acute  . Irritable bowel syndrome 04/12/2012  . Chronic diarrhea 04/12/2012  . Encounter for long-term (current) use of anticoagulants 03/17/2012  . Depression 03/13/2012  . Anxiety 03/13/2012  . Mitral regurgitation 03/11/2012  . Chronic epigastric pain 01/25/2012  . Atrial fibrillation    . Hypokalemia 11/26/2011  . UTI (urinary tract infection) 11/13/2011  . DM II (diabetes mellitus, type II), controlled ([place]) 11/13/2011  . HTN (hypertension) 11/13/2011  . Hypercholesteremia 11/13/2011  . Glaucoma (increased eye pressure) 11/13/2011    Orientation RESPIRATION BLADDER Height & Weight     Self  Normal Incontinent Weight: 139 lb 12.4 oz (63.4 kg) Height:  5' (152.4 cm)  BEHAVIORAL SYMPTOMS/MOOD NEUROLOGICAL BOWEL NUTRITION STATUS  (NA) (NA) Continent(IBS) Diet  AMBULATORY STATUS COMMUNICATION OF NEEDS Skin   Limited Assist Verbally Normal                       Personal Care Assistance Level of Assistance  Bathing, Dressing, Feeding Bathing Assistance:  Maximum assistance Feeding assistance: Limited assistance Dressing Assistance: Maximum assistance     Functional Limitations Info  (NA)          SPECIAL CARE FACTORS FREQUENCY                       Contractures Contractures Info: Present    Additional Factors Info  Allergies   Allergies Info: Lactose intolerance, pencillins, sulfa antibiotics           Current Medications (10/31/2017):  This is the current hospital active medication list Current Facility-Administered Medications  Medication Dose Route Frequency Provider Last Rate Last Dose  . acetaminophen (TYLENOL) tablet 650 mg  650 mg Oral Q6H PRN Ivor Costa, MD       Or  . acetaminophen (TYLENOL) suppository 650 mg  650 mg Rectal Q6H PRN Ivor Costa, MD      . albuterol (PROVENTIL) (2.5 MG/3ML) 0.083% nebulizer solution 2.5 mg  2.5 mg Nebulization Q4H PRN Ivor Costa, MD      . alum & mag hydroxide-simeth (MAALOX/MYLANTA) 200-200-20 MG/5ML suspension 30 mL  30 mL Oral Q6H PRN Ivor Costa, MD      . dicyclomine (BENTYL) tablet 10 mg  10 mg Oral TID Renella Cunas, MD   10 mg at 10/31/17 1225  . diltiazem (CARDIZEM CD) 24 hr capsule 120 mg  120 mg Oral Daily Ivor Costa, MD   120 mg at 10/31/17 0914  . gabapentin (NEURONTIN) capsule 100 mg  100 mg Oral BID Ivor Costa, MD   100  mg at 10/31/17 0914  . hydrALAZINE (APRESOLINE) injection 5 mg  5 mg Intravenous Q2H PRN Ivor Costa, MD      . HYDROcodone-acetaminophen (NORCO/VICODIN) 5-325 MG per tablet 1 tablet  1 tablet Oral Q6H PRN Ivor Costa, MD   1 tablet at 10/30/17 2311  . insulin aspart (novoLOG) injection 0-9 Units  0-9 Units Subcutaneous TID WC Ivor Costa, MD   1 Units at 10/31/17 1225  . insulin glargine (LANTUS) injection 15 Units  15 Units Subcutaneous Daily Ivor Costa, MD   15 Units at 10/31/17 0915  . latanoprost (XALATAN) 0.005 % ophthalmic solution 1 drop  1 drop Both Eyes QHS Ivor Costa, MD   1 drop at 10/30/17 2306  . loperamide (IMODIUM) capsule 2 mg  2 mg  Oral PRN Ivor Costa, MD      . LORazepam (ATIVAN) tablet 1 mg  1 mg Oral TID Ivor Costa, MD   1 mg at 10/31/17 0914  . methylphenidate (RITALIN) tablet 5 mg  5 mg Oral Daily Ivor Costa, MD   5 mg at 10/31/17 0914  . metoprolol tartrate (LOPRESSOR) tablet 12.5 mg  12.5 mg Oral BID Alma Friendly, MD   12.5 mg at 10/31/17 0915  . mirtazapine (REMERON) tablet 7.5 mg  7.5 mg Oral QHS Ivor Costa, MD   7.5 mg at 10/30/17 2303  . mometasone-formoterol (DULERA) 200-5 MCG/ACT inhaler 2 puff  2 puff Inhalation BID Ivor Costa, MD   2 puff at 10/31/17 0933  . multivitamin with minerals tablet 1 tablet  1 tablet Oral Daily Ivor Costa, MD   1 tablet at 10/31/17 0915  . ondansetron (ZOFRAN) injection 4 mg  4 mg Intravenous Q8H PRN Ivor Costa, MD      . oxybutynin (DITROPAN) tablet 5 mg  5 mg Oral QHS Ivor Costa, MD   5 mg at 10/30/17 2302  . pantoprazole (PROTONIX) EC tablet 40 mg  40 mg Oral BID Alma Friendly, MD   40 mg at 10/31/17 0914  . timolol (TIMOPTIC) 0.5 % ophthalmic solution 1 drop  1 drop Both Eyes Daily Ivor Costa, MD   1 drop at 10/31/17 0915  . warfarin (COUMADIN) tablet 2.5 mg  2.5 mg Oral ONCE-1800 Rolla Flatten, [facility]      . Warfarin - Pharmacist Dosing Inpatient   Does not apply q1800 Laren Everts, RPH      . zolpidem (AMBIEN) tablet 5 mg  5 mg Oral QHS PRN Ivor Costa, MD         Discharge Medications: Please see discharge summary for a list of discharge medications.  Relevant Imaging Results:  Relevant Lab Results:   Additional Hayden Maebell Lyvers, LCSWA

## 2017-11-01 DIAGNOSIS — R41841 Cognitive communication deficit: Secondary | ICD-10-CM | POA: Diagnosis not present

## 2017-11-01 DIAGNOSIS — K2970 Gastritis, unspecified, without bleeding: Secondary | ICD-10-CM | POA: Diagnosis not present

## 2017-11-01 DIAGNOSIS — I482 Chronic atrial fibrillation: Secondary | ICD-10-CM | POA: Diagnosis not present

## 2017-11-01 DIAGNOSIS — Z515 Encounter for palliative care: Secondary | ICD-10-CM

## 2017-11-01 DIAGNOSIS — R1013 Epigastric pain: Secondary | ICD-10-CM

## 2017-11-01 DIAGNOSIS — J449 Chronic obstructive pulmonary disease, unspecified: Secondary | ICD-10-CM

## 2017-11-01 DIAGNOSIS — I4891 Unspecified atrial fibrillation: Secondary | ICD-10-CM | POA: Diagnosis not present

## 2017-11-01 DIAGNOSIS — Z7189 Other specified counseling: Secondary | ICD-10-CM | POA: Diagnosis not present

## 2017-11-01 DIAGNOSIS — M25572 Pain in left ankle and joints of left foot: Secondary | ICD-10-CM | POA: Diagnosis not present

## 2017-11-01 DIAGNOSIS — F329 Major depressive disorder, single episode, unspecified: Secondary | ICD-10-CM | POA: Diagnosis not present

## 2017-11-01 DIAGNOSIS — E876 Hypokalemia: Secondary | ICD-10-CM | POA: Diagnosis not present

## 2017-11-01 DIAGNOSIS — F322 Major depressive disorder, single episode, severe without psychotic features: Secondary | ICD-10-CM | POA: Diagnosis not present

## 2017-11-01 DIAGNOSIS — E139 Other specified diabetes mellitus without complications: Secondary | ICD-10-CM | POA: Diagnosis not present

## 2017-11-01 DIAGNOSIS — R109 Unspecified abdominal pain: Secondary | ICD-10-CM | POA: Diagnosis not present

## 2017-11-01 DIAGNOSIS — E119 Type 2 diabetes mellitus without complications: Secondary | ICD-10-CM | POA: Diagnosis not present

## 2017-11-01 DIAGNOSIS — N3000 Acute cystitis without hematuria: Secondary | ICD-10-CM | POA: Diagnosis not present

## 2017-11-01 DIAGNOSIS — M6281 Muscle weakness (generalized): Secondary | ICD-10-CM | POA: Diagnosis not present

## 2017-11-01 DIAGNOSIS — Z7901 Long term (current) use of anticoagulants: Secondary | ICD-10-CM | POA: Diagnosis not present

## 2017-11-01 DIAGNOSIS — F419 Anxiety disorder, unspecified: Secondary | ICD-10-CM | POA: Diagnosis not present

## 2017-11-01 DIAGNOSIS — K2960 Other gastritis without bleeding: Secondary | ICD-10-CM | POA: Diagnosis not present

## 2017-11-01 DIAGNOSIS — I10 Essential (primary) hypertension: Secondary | ICD-10-CM | POA: Diagnosis not present

## 2017-11-01 DIAGNOSIS — N390 Urinary tract infection, site not specified: Secondary | ICD-10-CM | POA: Diagnosis not present

## 2017-11-01 DIAGNOSIS — M25579 Pain in unspecified ankle and joints of unspecified foot: Secondary | ICD-10-CM | POA: Diagnosis not present

## 2017-11-01 DIAGNOSIS — R2681 Unsteadiness on feet: Secondary | ICD-10-CM | POA: Diagnosis not present

## 2017-11-01 DIAGNOSIS — R278 Other lack of coordination: Secondary | ICD-10-CM | POA: Diagnosis not present

## 2017-11-01 LAB — PROTIME-INR
INR: 1.93
Prothrombin Time: 21.9 seconds — ABNORMAL HIGH (ref 11.4–15.2)

## 2017-11-01 LAB — GLUCOSE, CAPILLARY
GLUCOSE-CAPILLARY: 109 mg/dL — AB (ref 65–99)
GLUCOSE-CAPILLARY: 139 mg/dL — AB (ref 65–99)

## 2017-11-01 MED ORDER — METHYLPHENIDATE HCL 5 MG PO TABS: 5.0000 mg | ORAL_TABLET | ORAL | 0 refills | Status: DC

## 2017-11-01 MED ORDER — LORAZEPAM 1 MG PO TABS: 1.0000 mg | ORAL_TABLET | Freq: Three times a day (TID) | ORAL | 0 refills | Status: DC

## 2017-11-01 MED ORDER — HYDROCODONE-ACETAMINOPHEN 5-325 MG PO TABS: 1.0000 | ORAL_TABLET | Freq: Four times a day (QID) | ORAL | 0 refills | Status: DC | PRN

## 2017-11-01 NOTE — Clinical Social Work Placement (Signed)
   CLINICAL SOCIAL WORK PLACEMENT  NOTE  Date:  11/01/2017  Patient Details  Name: [name] [name] MRN: [id number] Date of Birth: 01/06/1930  Clinical Social Work is seeking post-discharge placement for this patient at the [place] level of care (*CSW will initial, date and re-position this form in  chart as items are completed):  Yes   Patient/family provided with [place] Work Department's list of facilities offering this level of care within the geographic area requested by the patient (or if unable, by the patient's family).  Yes   Patient/family informed of their freedom to choose among providers that offer the needed level of care, that participate in Medicare, Medicaid or managed care program needed by the patient, have an available bed and are willing to accept the patient.  Yes   Patient/family informed of [facility]'s ownership interest in [facility] and [facility], as well as of the fact that they are under no obligation to receive care at these facilities.  PASRR submitted to EDS on       PASRR number received on       Existing PASRR number confirmed on 11/01/17     FL2 transmitted to all facilities in geographic area requested by pt/family on 11/01/17     FL2 transmitted to all facilities within larger geographic area on       Patient informed that his/her managed care company has contracts with or will negotiate with certain facilities, including the following:        Yes   Patient/family informed of bed offers received.  Patient chooses bed at [place] [place]     Physician recommends and patient chooses bed at      Patient to be transferred to [facility] on 11/01/17.  Patient to be transferred to facility by [facility]     Patient family notified on 11/01/17 of transfer.  Name of family member notified:  Spouse     PHYSICIAN Please prepare priority discharge summary, including medications      Additional Comment:    _______________________________________________ Benard Halsted, [place] 11/01/2017, 1:56 PM

## 2017-11-01 NOTE — Progress Notes (Signed)
Patient was discharged to nursing home ([facility]) by MD order; discharged instructions review and sent to facility with care notes and prescriptions; IV DIC; facility was called and report was given to nurse Estill Bamberg.Patient will be transported to facility by her husband.

## 2017-11-01 NOTE — Consult Note (Signed)
Consultation Note Date: 11/01/2017   Patient Name: [name] [name]  DOB: 1930/02/27  MRN: [id number]  Age / Sex: 84 y.o., female  PCP: [name], MD Referring Physician: Alma Friendly, MD  Reason for Consultation: Establishing goals of care  HPI/Patient Profile: 84 y.o. female  with past medical history of HTN, COPD (stable, not on home O2), GERD, depression/anxiety, pancreatitis, peptic valve regurgitation, a fib on coumadin, IBS,   admitted on 10/28/2017 with abdominal pain. Workup revealed likely gastritis, possible UTI. OT noted patient with decreased mental status, declining functional status over the past year- palliative medicine consulted for "overall decline in physical activity, ?dementia, advanced age".   Clinical Assessment and Goals of Care:  I have reviewed medical records including EPIC notes, labs and imaging, assessed the patient and then met at the bedside along with patient's  to discuss diagnosis prognosis, GOC, EOL wishes, disposition and options.  I introduced Palliative Medicine as specialized medical care for people living with serious illness. It focuses on providing relief from the symptoms and stress of a serious illness. The goal is to improve quality of life for both the patient and the family.  We discussed a brief life review of the patient. She and spouse married in their 26's. They do not have children. They met in a nursing home where their parents were staying.   As far as functional and nutritional status- prior to admission she was independent with ADL's. She was ambulating with a walker. Able to dress, bathe, toilet and feed herself. Her spouse notes that when at home she spent a lot of time in her bed- but she says that this is where she is comfortable. He takes her out to eat with friends- but she is no longer able to go to church.    We discussed their  current illness and what it means in the larger context of their on-going co-morbidities.  Natural disease trajectory and expectations at EOL were discussed. We discussed the possibility that patient has dementia- Patient's spouse says she has not been diagnosed, and he is uncertain if her pain causes her confusion or if she actually has dementia. Discussed with patient what she expects of her future. She says "I would like to get better, but I know I'm not going to get back to my 84 year old self".   Advanced directives, concepts specific to code status, artifical feeding and hydration, and rehospitalization were considered and discussed. Patient has a living will. She would not want long term ventilation or artificial feeding. She requests full code status for now.   Palliative Care services outpatient were explained and offered.  Questions and concerns were addressed.  Hard Choices booklet left for review. The family was encouraged to call with questions or concerns.    Primary Decision Maker NEXT OF KIN- patient's spouse (he also has HCPOA)    SUMMARY OF RECOMMENDATIONS -Recommend outpatient palliative at SNF for continued [place] -Patient and spouse are hopeful for d/c to SNF for rehab with eventual return  to patient's home -Discussed that [facility] services may be helpful when patient returns home    Code Status/Advance Care Planning:  Full code   Additional Recommendations (Limitations, Scope, Preferences):  Full Scope Treatment  Prognosis:    Unable to determine  Discharge Planning: [place] for rehab with Palliative care service follow-up  Primary Diagnoses: Present on Admission: . HTN (hypertension) . Depression . Atrial fibrillation  . Anxiety . COPD (chronic obstructive pulmonary disease) ([place]) . Abdominal pain . UTI (urinary tract infection) . Hypokalemia   I have reviewed the medical record, interviewed the patient and family, and  examined the patient. The following aspects are pertinent.  Past Medical History:  Diagnosis Date  . Atrial fibrillation (HCC)    RVR hx.  Chronic Coumadin  . C. difficile colitis 09/21/2013, 11/2013  . Carotid stenosis    a. Carotid US (10/2013):  bilat 1-39%; f/u 1 year  . Chronic lower back pain   . COPD (chronic obstructive pulmonary disease) ([place])   . Depression   . GERD (gastroesophageal reflux disease)    with HH  . Glaucoma   . HCAP (healthcare-associated pneumonia) 11/21/2011  . High cholesterol   . Hypertension   . Metabolic encephalopathy 03/06/2632  . Mitral regurgitation 03/11/2012  . Pancreatitis    ~2008, 11/2011 post ERCP  . Pneumonia 12/2011   "first time I know about"  . Sepsis ([place]) 11/27/2013  . SIRS (systemic inflammatory response syndrome) ([place]) 03/13/2012   a/w post ERCP  pancreatitis.   . Type II diabetes mellitus ([place])    Social History   Socioeconomic History  . Marital status: Married    Spouse name: [name]  . Number of children: 0  . Years of education: None  . Highest education level: None  Social Needs  . Financial resource strain: None  . Food insecurity - worry: None  . Food insecurity - inability: None  . Transportation needs - medical: None  . Transportation needs - non-medical: None  Occupational History  . None  Tobacco Use  . Smoking status: Never Smoker  . Smokeless tobacco: Never Used  Substance and Sexual Activity  . Alcohol use: No  . Drug use: No  . Sexual activity: Not Currently  Other Topics Concern  . None  Social History Narrative   Lives at home with her husband.   No children   Family History  Problem Relation Age of Onset  . Stroke Father 55  . Stroke Mother 66  . Anesthesia problems Neg Hx   . Hypotension Neg Hx   . Malignant hyperthermia Neg Hx   . Pseudochol deficiency Neg Hx   . Stomach cancer Neg Hx   . Esophageal cancer Neg Hx    Scheduled Meds: . dicyclomine  10 mg Oral TID AC  . diltiazem  120 mg Oral  Daily  . gabapentin  100 mg Oral BID  . insulin aspart  0-9 Units Subcutaneous TID WC  . insulin glargine  10 Units Subcutaneous Daily  . latanoprost  1 drop Both Eyes QHS  . LORazepam  1 mg Oral TID  . methylphenidate  5 mg Oral Daily  . metoprolol tartrate  12.5 mg Oral BID  . mirtazapine  7.5 mg Oral QHS  . mometasone-formoterol  2 puff Inhalation BID  . multivitamin with minerals  1 tablet Oral Daily  . oxybutynin  5 mg Oral QHS  . pantoprazole  40 mg Oral BID  . timolol  1  drop Both Eyes Daily  . Warfarin - Pharmacist Dosing Inpatient   Does not apply q1800   Continuous Infusions: PRN Meds:.acetaminophen **OR** acetaminophen, albuterol, alum & mag hydroxide-simeth, hydrALAZINE, HYDROcodone-acetaminophen, loperamide, ondansetron (ZOFRAN) IV, zolpidem Medications Prior to Admission:  Prior to Admission medications   Medication Sig Start Date End Date Taking? Authorizing Provider  dicyclomine (BENTYL) 20 MG tablet Every AM and then every 6 hrs as needed Patient taking differently: Take 20 mg by mouth daily.  02/05/15  Yes Gatha Mayer, MD  dicyclomine (BENTYL) 20 MG tablet TAKE 1/2 TABLET BY MOUTH EVERY 6 HOURS AS NEEDED FOR SPASMS(ABDOMINAL PAIN) 09/16/16  Yes Gatha Mayer, MD  DILT-XR 120 MG 24 hr capsule TAKE 1 CAPSULE(120 MG) BY MOUTH DAILY 09/29/16  Yes Minus Breeding, MD  gabapentin (NEURONTIN) 100 MG capsule Take 1 capsule by mouth 2 (two) times daily. 04/06/15  Yes [provider]  glimepiride (AMARYL) 4 MG tablet Take 4 mg by mouth daily with breakfast.    Yes [provider]  HYDROcodone-acetaminophen (NORCO/VICODIN) 5-325 MG per tablet Take 1 tablet by mouth every 6 (six) hours as needed.  04/01/15  Yes [provider]  LANTUS SOLOSTAR 100 UNIT/ML Solostar Pen Inject 22 Units into the skin daily.  09/01/13  Yes [provider]  latanoprost (XALATAN) 0.005 % ophthalmic solution Place 1 drop into both eyes at bedtime.   Yes [provider]  loperamide (LOPERAMIDE A-D) 2 MG tablet Take 1 tablet (2 mg total) by mouth as needed for diarrhea or loose stools (use at bedtime). 11/27/14  Yes Gatha Mayer, MD  LORazepam (ATIVAN) 1 MG tablet Take 1 mg by mouth 3 (three) times daily.  06/27/14  Yes [provider]  methylphenidate (RITALIN) 5 MG tablet Take 5 mg by mouth See admin instructions. take 50m in the morning daily and then 526mtwice a day on every other day 06/27/14  Yes [provider]  metoprolol tartrate (LOPRESSOR) 25 MG tablet TAKE 1 TABLET(25 MG) BY MOUTH TWICE DAILY 08/30/17  Yes HoMinus BreedingMD  mirtazapine (REMERON) 7.5 MG tablet TAKE 1 TABLET(7.5 MG) BY MOUTH AT BEDTIME 10/06/16  Yes GeGatha MayerMD  Multiple Vitamin (MULTIVITAMIN WITH MINERALS) TABS Take 1 tablet by mouth daily.   Yes [provider]  nitrofurantoin, macrocrystal-monohydrate, (MACROBID) 100 MG capsule Take 1 capsule (100 mg total) by mouth 2 (two) times daily. X 7 days 10/24/17  Yes BeMalvin JohnsMD  omeprazole (PRILOSEC) 20 MG capsule Take 20 mg by mouth 2 (two) times daily.    Yes [provider]  oxybutynin (DITROPAN) 5 MG tablet Take 1 tablet by mouth at bedtime. 07/29/16  Yes [provider]  promethazine (PHENERGAN) 25 MG tablet Take 25 mg by mouth 3 (three) times daily as needed for nausea or vomiting.   Yes [provider]  SYMBICORT 160-4.5 MCG/ACT inhaler Inhale 2 puffs into the lungs 2 (two) times daily.  03/08/14  Yes [provider]  timolol (TIMOPTIC) 0.5 % ophthalmic solution  06/02/15  Yes [provider]  warfarin (COUMADIN) 5 MG tablet Take 2.5-5 mg by mouth daily. Take 1.2589mvery Sun/Wed then 2.5 mg the rest of the week   Yes [provider]   Allergies  Allergen Reactions  . Lactose Intolerance (Gi) Nausea And Vomiting    severe stomach pain  . Penicillins Hives and Itching    "haven't used any since 1970's" "haven't used any since  1970's"  .  Sulfa Antibiotics Hives and Itching    "haven't used any since 1970's"  . Ciprofloxacin Swelling  . Sulfasalazine Hives and Itching    "haven't used any since 1970's"   Review of Systems  Constitutional: Positive for activity change.  All other systems reviewed and are negative.   Physical Exam  Constitutional: She is oriented to person, place, and time. She appears well-developed and well-nourished.  Pulmonary/Chest: Effort normal.  Neurological: She is alert and oriented to person, place, and time.  Skin: Skin is warm and dry.  Psychiatric:  Some disturbance in thought content  Nursing note and vitals reviewed.   Vital Signs: BP (!) 165/96   Pulse 89   Temp 98 F (36.7 C) (Oral)   Resp 18   Ht 5' (1.524 m)   Wt 63.4 kg (139 lb 12.4 oz)   SpO2 95%   BMI 27.30 kg/m  Pain Assessment: 0-10   Pain Score: Asleep   SpO2: SpO2: 95 % O2 Device:SpO2: 95 % O2 Flow Rate: .   IO: Intake/output summary:   Intake/Output Summary (Last 24 hours) at 11/01/2017 1330 Last data filed at 11/01/2017 1032 Gross per 24 hour  Intake 380 ml  Output 1100 ml  Net -720 ml    LBM: Last BM Date: 10/28/17 Baseline Weight: Weight: 63.4 kg (139 lb 12.4 oz) Most recent weight: Weight: 63.4 kg (139 lb 12.4 oz)     Palliative Assessment/Data:     Thank you for this consult. Palliative medicine will continue to follow and assist as needed.   Time In: 1045 Time Out: 1155 Time Total: 70 mins Greater than 50%  of this time was spent counseling and coordinating care related to the above assessment and plan.  Signed by: Mariana Kaufman, AGNP-C Palliative Medicine    Please contact Palliative Medicine Team phone at [phone] for questions and concerns.  For individual provider: See Shea Evans

## 2017-11-01 NOTE — Progress Notes (Signed)
Patient will DC to: [facility] Anticipated DC date: 11/01/17 Family notified: Spouse Transport by: Spouse by car (returning to hospital soon)   Per MD patient ready for DC to [facility]. RN, patient, patient's family, and facility notified of DC. Discharge Summary sent to facility. RN given number for report [phone] Room 103A).    CSW signing off.  Cedric Fishman, [place] Social Worker [phone]

## 2017-11-01 NOTE — Discharge Summary (Signed)
Discharge Summary  [name] [name] OVA:[id number] DOB: 12-11-1929  PCP: [name], MD  Admit date: 10/28/2017 Discharge date: 11/01/2017  Time spent: < 30 mins  Recommendations for Outpatient Follow-up:  1. PCP 2. GI  Discharge Diagnoses:  Active Hospital Problems   Diagnosis Date Noted  . Abdominal pain 08/05/2012    Class: Acute  . Advance care planning   . Goals of care, counseling/discussion   . COPD (chronic obstructive pulmonary disease) ([place]) 10/28/2017  . Encounter for long-term (current) use of anticoagulants 03/17/2012  . Depression 03/13/2012  . Anxiety 03/13/2012  . Atrial fibrillation    . Hypokalemia 11/26/2011  . HTN (hypertension) 11/13/2011  . DM II (diabetes mellitus, type II), controlled ([place]) 11/13/2011  . UTI (urinary tract infection) 11/13/2011    Resolved Hospital Problems  No resolved problems to display.    Discharge Condition: Stable  Diet recommendation: Heart healthy  Vitals:   11/01/17 0900 11/01/17 1331  BP:  (!) 140/56  Pulse: 89 81  Resp: 18 18  Temp:  98 F (36.7 C)  SpO2: 95% 96%    History of present illness:  [name] [name] a 84 y.o.femalewith medical history significant ofhypertension, hyperlipidemia, diabetes mellitus, COPD, GERD, depression with anxiety, pancreatitis, atrial fibrillation on Coumadin, C. difficile colitis, IBS, who presents with chronic intermittent abdominal pain for the past couple of months, worse over the past 3 weeks. Pain is associated with nausea and occasional vomiting for prolonged time. On admission, pain is located in epigastric region, radiates to her back, 10/10 in intensity. She has occasional loose stool, but has a hx of IBS. Denies fever or chills or any other symptoms. Of note, pt had EGD by Dr. Carlean Purl on 06/02/16, which showedtortuous esophagus and gastritis, neg for H.pylori. Pt denies NSAIDs or ASA or alcohol usage. In the ED, lipase was noted to be WNL. CT abdomen/pelvis  showed no acute abnormality. Pt has a hx of cholecystectomy. Patient admitted for further management.  Today, met patient resting in bed, denied any new complaints, denies any abdominal pain, epigastric pain, chest pain, nausea/vomiting/diarrhea/constipation, fever/chills.  Patient stable to be discharged to [facility] Course:  Principal Problem:   Abdominal pain Active Problems:   UTI (urinary tract infection)   DM II (diabetes mellitus, type II), controlled (HCC)   HTN (hypertension)   Hypokalemia   Atrial fibrillation    Depression   Anxiety   Encounter for long-term (current) use of anticoagulants   COPD (chronic obstructive pulmonary disease) (H[facility])   Advance care planning   Goals of care, counseling/discussion  Abdominal pain likely due to chronic gastritis Resolved Unknown etiology, likely gastritis. Unlikely pancreatitis, r/o ??mesenteric ischemia, although atypical for epigastric pain. Watch out for any rash, ??shingles (although pain is chronic) Afebrile, no leukocytosis Lipase WNL, LA WNL CT abdomen/pelvis no acute changes EGD on 06/02/16 by Dr. Ross Ludwig showed gastritis and tortuous esophagus  Continue PPI  UTI Currently asypmtomatic Repeat UA neg, UC no growth Completed Macrobid for 7 days  Chronic Atrial Fibrillation Rate controlled CHA2DS2-VASc Scoreis 5 Continue Coumadin, metoprololand Dilt-Xr  DM type II  Last A1c7.1 on 03/11/14 Continue home regimen, lantus and Amaryl  HTN:  Continue home medications:Metoprolol, Dilt-Xr  Depression and anxiety Stable, no suicidal or homicidal ideations. Continue home medications:Remeron, Ativan  COPD Stable Dulera inhaler and prn albuterol nebs  Hypokalemia Resolved Replace prn    Procedures:  None  Consultations:  None   Discharge Exam: BP (!) 140/56 (BP Location: Left Wrist)  Pulse 81   Temp 98 F (36.7 C) (Oral)   Resp 18   Ht 5' (1.524 m)   Wt 63.4 kg (139 lb 12.4 oz)    SpO2 96%   BMI 27.30 kg/m   General: Awake, alert, NAD Cardiovascular: Irregularly irregular, no added heart sounds Respiratory: Chest clear bilaterally  Discharge Instructions You were cared for by a hospitalist during your hospital stay. If you have any questions about your discharge medications or the care you received while you were in the hospital after you are discharged, you can call the unit and asked to speak with the hospitalist on call if the hospitalist that took care of you is not available. Once you are discharged, your primary care physician will handle any further medical issues. Please note that NO REFILLS for any discharge medications will be authorized once you are discharged, as it is imperative that you return to your primary care physician (or establish a relationship with a primary care physician if you do not have one) for your aftercare needs so that they can reassess your need for medications and monitor your lab values.   Allergies as of 11/01/2017      Reactions   Lactose Intolerance (gi) Nausea And Vomiting   severe stomach pain   Penicillins Hives, Itching   "haven't used any since 1970's" "haven't used any since 1970's"   Sulfa Antibiotics Hives, Itching   "haven't used any since 1970's"   Ciprofloxacin Swelling   Sulfasalazine Hives, Itching   "haven't used any since 1970's"      Medication List    STOP taking these medications   nitrofurantoin (macrocrystal-monohydrate) 100 MG capsule Commonly known as:  MACROBID     TAKE these medications   dicyclomine 20 MG tablet Commonly known as:  BENTYL TAKE 1/2 TABLET BY MOUTH EVERY 6 HOURS AS NEEDED FOR SPASMS(ABDOMINAL PAIN) What changed:  Another medication with the same name was removed. Continue taking this medication, and follow the directions you see here.   DILT-XR 120 MG 24 hr capsule Generic drug:  diltiazem TAKE 1 CAPSULE(120 MG) BY MOUTH DAILY   gabapentin 100 MG capsule Commonly known  as:  NEURONTIN Take 1 capsule by mouth 2 (two) times daily.   glimepiride 4 MG tablet Commonly known as:  AMARYL Take 4 mg by mouth daily with breakfast.   HYDROcodone-acetaminophen 5-325 MG tablet Commonly known as:  NORCO/VICODIN Take 1 tablet by mouth every 6 (six) hours as needed.   LANTUS SOLOSTAR 100 UNIT/ML Solostar Pen Generic drug:  Insulin Glargine Inject 22 Units into the skin daily.   latanoprost 0.005 % ophthalmic solution Commonly known as:  XALATAN Place 1 drop into both eyes at bedtime.   loperamide 2 MG tablet Commonly known as:  LOPERAMIDE A-D Take 1 tablet (2 mg total) by mouth as needed for diarrhea or loose stools (use at bedtime).   LORazepam 1 MG tablet Commonly known as:  ATIVAN Take 1 mg by mouth 3 (three) times daily.   methylphenidate 5 MG tablet Commonly known as:  RITALIN Take 5 mg by mouth See admin instructions. take 26m in the morning daily and then 528mtwice a day on every other day   metoprolol tartrate 25 MG tablet Commonly known as:  LOPRESSOR TAKE 1 TABLET(25 MG) BY MOUTH TWICE DAILY   mirtazapine 7.5 MG tablet Commonly known as:  REMERON TAKE 1 TABLET(7.5 MG) BY MOUTH AT BEDTIME   multivitamin with minerals Tabs tablet Take 1 tablet by  mouth daily.   omeprazole 20 MG capsule Commonly known as:  PRILOSEC Take 20 mg by mouth 2 (two) times daily.   oxybutynin 5 MG tablet Commonly known as:  DITROPAN Take 1 tablet by mouth at bedtime.   promethazine 25 MG tablet Commonly known as:  PHENERGAN Take 25 mg by mouth 3 (three) times daily as needed for nausea or vomiting.   SYMBICORT 160-4.5 MCG/ACT inhaler Generic drug:  budesonide-formoterol Inhale 2 puffs into the lungs 2 (two) times daily.   timolol 0.5 % ophthalmic solution Commonly known as:  TIMOPTIC   warfarin 5 MG tablet Commonly known as:  COUMADIN Take 2.5-5 mg by mouth daily. Take 1.66m every Sun/Wed then 2.5 mg the rest of the week      Allergies  Allergen  Reactions  . Lactose Intolerance (Gi) Nausea And Vomiting    severe stomach pain  . Penicillins Hives and Itching    "haven't used any since 1970's" "haven't used any since 1970's"  . Sulfa Antibiotics Hives and Itching    "haven't used any since 1970's"  . Ciprofloxacin Swelling  . Sulfasalazine Hives and Itching    "haven't used any since 1970's"    Contact information for follow-up providers    PLeanna Battles MD. Schedule an appointment as soon as possible for a visit in 1 week(s).   Specialty:  Internal Medicine Contact information: [phone]        GGatha Mayer MD. Schedule an appointment as soon as possible for a visit in 4 week(s).   Specialty:  Gastroenterology Contact information: [address] [phone]            Contact information for after-discharge care    Destination    [facility] Follow up.   Service:  Skilled Nursing Contact information: [phone]                  The results of significant diagnostics from this hospitalization (including imaging, microbiology, ancillary and laboratory) are listed below for reference.    Significant Diagnostic Studies: Ct Abdomen Pelvis W Contrast  Result Date: 10/28/2017 CLINICAL DATA:  Nausea, vomiting, epigastric and LEFT upper quadrant pain for several weeks, history pancreatitis, COPD, hypertension, atrial fibrillation, type II diabetes mellitus EXAM: CT ABDOMEN AND PELVIS WITH CONTRAST TECHNIQUE: Multidetector CT imaging of the abdomen and pelvis was performed using the standard protocol following bolus administration of intravenous contrast. Sagittal and coronal MPR images reconstructed from axial data set. CONTRAST:  103mISOVUE-300 IOPAMIDOL (ISOVUE-300) INJECTION 61% IV. No oral contrast. COMPARISON:  04/26/2014 FINDINGS: Lower chest: Lung  bases clear Hepatobiliary: Gallbladder surgically absent. Liver normal appearance. Pancreas: Pancreas unremarkable. No evidence of pancreatic mass, ductal dilatation or peripancreatic infiltrative changes. Spleen: Normal appearance Adrenals/Urinary Tract: Adrenal glands normal appearance. Large cyst at upper pole RIGHT kidney 4.8 x 4.6 cm image 32. Kidneys, ureters, and bladder otherwise normal appearance. Stomach/Bowel: Appendix surgically absent by history. Stomach and bowel loops grossly unremarkable for exam lacking adequate distention and contrast. Vascular/Lymphatic: Atherosclerotic calcifications of aorta, iliac arteries and coronary arteries. Mitral annular calcification. Aorta normal caliber. Retroaortic LEFT renal vein. No adenopathy. Reproductive: Uterus surgically absent with unremarkable ovaries. Other: No free air, free fluid, hernia, or acute inflammatory process. Musculoskeletal: Bones demineralized diffusely. Advanced degenerative disc disease changes at L4-L5 progressive since prior exam with slightly increased disc space narrowing and anterolisthesis. Prominent pseudo  disc at L4-L5 with multifactorial central acquired spinal stenosis. IMPRESSION: Distal colonic diverticulosis without evidence of diverticulitis. No acute intra-abdominal or intrapelvic abnormalities identified. Central acquired spinal stenosis at L4-L5 with increased anterolisthesis, pseudo disc and narrowing at the L4-L5 disc space since the previous exam. Aortic atherosclerosis and coronary arterial calcifications. Electronically Signed   By: Lavonia Dana M.D.   On: 10/28/2017 18:15   Dg Abd Acute W/chest  Result Date: 10/24/2017 CLINICAL DATA:  Vomiting EXAM: DG ABDOMEN ACUTE W/ 1V CHEST COMPARISON:  CT abdomen pelvis 04/26/2014, chest radiograph 03/10/2014 FINDINGS: Single-view chest demonstrates no acute consolidation or effusion. Heart size within normal limits. Aortic atherosclerosis. Dense mitral calcification. Supine and  upright views of the abdomen demonstrate no free air beneath the diaphragm. There are surgical clips in the right upper quadrant. Nonobstructed bowel-gas pattern. No abnormal calcifications. IMPRESSION: Nonobstructed bowel-gas pattern.  No acute cardiopulmonary disease. Electronically Signed   By: Donavan Foil M.D.   On: 10/24/2017 22:35    Microbiology: Recent Results (from the past 240 hour(s))  Urine culture     Status: Abnormal   Collection Time: 10/24/17 10:32 PM  Result Value Ref Range Status   Specimen Description URINE, CLEAN CATCH  Final   Special Requests NONE  Final   Culture >=100,000 COLONIES/mL ESCHERICHIA COLI (A)  Final   Report Status 10/27/2017 FINAL  Final   Organism ID, Bacteria ESCHERICHIA COLI (A)  Final      Susceptibility   Escherichia coli - MIC*    AMPICILLIN <=2 SENSITIVE Sensitive     CEFAZOLIN <=4 SENSITIVE Sensitive     CEFTRIAXONE <=1 SENSITIVE Sensitive     CIPROFLOXACIN <=0.25 SENSITIVE Sensitive     GENTAMICIN <=1 SENSITIVE Sensitive     IMIPENEM <=0.25 SENSITIVE Sensitive     NITROFURANTOIN 32 SENSITIVE Sensitive     TRIMETH/SULFA <=20 SENSITIVE Sensitive     AMPICILLIN/SULBACTAM <=2 SENSITIVE Sensitive     PIP/TAZO <=4 SENSITIVE Sensitive     Extended ESBL NEGATIVE Sensitive     * >=100,000 COLONIES/mL ESCHERICHIA COLI  Urine Culture     Status: None   Collection Time: 10/28/17  6:06 PM  Result Value Ref Range Status   Specimen Description URINE, RANDOM  Final   Special Requests NONE  Final   Culture NO GROWTH  Final   Report Status 10/30/2017 FINAL  Final     Labs: Basic Metabolic Panel: Recent Labs  Lab 10/28/17 1504 10/29/17 0308 10/30/17 0331  NA 136 140 141  K 3.1* 3.6 3.6  CL 106 114* 114*  CO2 14* 15* 19*  GLUCOSE 100* 70 112*  BUN _0 CREATININE 0.87 0.77 0.92  CALCIUM 9.2 8.4* 8.8*  MG  --  1.7  --    Liver Function Tests: Recent Labs  Lab 10/28/17 1504  AST 52*  ALT 28  ALKPHOS 69  BILITOT 1.2  PROT 7.1    ALBUMIN 3.8   Recent Labs  Lab 10/28/17 1504  LIPASE 33   No results for input(s): AMMONIA in the last 168 hours. CBC: Recent Labs  Lab 10/28/17 1504 10/29/17 0308 10/30/17 0331  WBC 12.5* 10.4 9.0  NEUTROABS  --   --  5.0  HGB 16.1* 13.7 13.9  HCT 45.8 39.6 40.5  MCV 89.6 90.6 92.5  PLT 261 217 194   Cardiac Enzymes: No results for input(s): CKTOTAL, CKMB, CKMBINDEX, TROPONINI in the last 168 hours. BNP: BNP (last 3 results) No results for input(s): BNP in  the last 8760 hours.  ProBNP (last 3 results) No results for input(s): PROBNP in the last 8760 hours.  CBG: Recent Labs  Lab 10/31/17 1157 10/31/17 1720 10/31/17 2108 11/01/17 0744 11/01/17 1202  GLUCAP 143* 96 133* 109* 139*       Signed:  Alma Friendly, MD [facility] 11/01/2017, 3:17 PM

## 2017-11-01 NOTE — Progress Notes (Signed)
Physical Therapy Treatment Patient Details Name: [name] [name] MRN: [id number] DOB: 1930/05/29 Today's Date: 11/01/2017    History of Present Illness 84 y.o. female admitted on 10/28/17 for abdominal pain.  Pt dx with UTI, hypokalemia, and A-fib.  CT scan of abdomen/pelvis was negative.  Pt with other significant PMH of DM 2, SIRS, pancreatitis, mitral regurgitation, metabolic encepholopath, HTN, depression, COPD, chronic low back pain, and cholecystectomy.    PT Comments    Pt remains to have confusion and strongly dislikes BP cuff on bicep and will rip it off unless you do a manual BP. NP from palliative present to complete manual BP. Pt did tolerate ambulation this date but gets irritated very easily. Due to confusion and impaired function cont to recommend SNF upon d/c. Acute PT to con't to follow.  Supine BP: 144/101 Sitting BP" 139/105 BP s/p amb: 140/89    Follow Up Recommendations  SNF;Supervision/Assistance - 24 hour     Equipment Recommendations  None recommended by PT    Recommendations for Other Services       Precautions / Restrictions Precautions Precautions: Fall Restrictions Weight Bearing Restrictions: No    Mobility  Bed Mobility Overal bed mobility: Needs Assistance Bed Mobility: Supine to Sit     Supine to sit: HOB elevated;Mod assist     General bed mobility comments: modA for trunk elevation and to scoot to EOB  Transfers Overall transfer level: Needs assistance Equipment used: Rolling walker (2 wheeled) Transfers: Sit to/from Stand Sit to Stand: Min assist         General transfer comment: v/c's to push up from bed, increased time, minA to steady during transition of hands  Ambulation/Gait Ambulation/Gait assistance: Min guard;Mod assist Ambulation Distance (Feet): 60 Feet Assistive device: Rolling walker (2 wheeled) Gait Pattern/deviations: Step-through pattern;Decreased stride length Gait velocity: dec Gait velocity  interpretation: Below normal speed for age/gender General Gait Details: modA for walker management due to pushing to far in front, 3 standing rest breaks, max encouragement to keep going   Stairs            Wheelchair Mobility    Modified Rankin (Stroke Patients Only)       Balance Overall balance assessment: Needs assistance Sitting-balance support: Feet supported;Bilateral upper extremity supported Sitting balance-Leahy Scale: Fair Sitting balance - Comments: pt able to maintain static sitting balance   Standing balance support: Bilateral upper extremity supported Standing balance-Leahy Scale: Poor Standing balance comment: needs assist from therapist and RW to stand and walk.                             Cognition Arousal/Alertness: Awake/alert Behavior During Therapy: Anxious Overall Cognitive Status: Impaired/Different from baseline Area of Impairment: Awareness;Problem solving;Memory;Orientation;Following commands;Safety/judgement                 Orientation Level: Disoriented to;Time(in/out of orientation)   Memory: Decreased short-term memory Following Commands: Follows one step commands with increased time Safety/Judgement: Decreased awareness of safety;Decreased awareness of deficits Awareness: Intellectual Problem Solving: Slow processing;Difficulty sequencing;Requires tactile cues;Requires verbal cues General Comments: pt becomes very agitated and aggressive if you place BP cuff on bicep. pt ripped it off       Exercises      General Comments General comments (skin integrity, edema, etc.): pt with bruising on distal left forearm      Pertinent Vitals/Pain Pain Assessment: Faces Faces Pain Scale: Hurts little more Pain Location: feet with amb  Pain Descriptors / Indicators: Grimacing;Guarding Pain Intervention(s): Monitored during session    Home Living                      Prior Function            PT Goals  (current goals can now be found in the care plan section) Progress towards PT goals: Progressing toward goals    Frequency    Min 3X/week      PT Plan Current plan remains appropriate    Co-evaluation              AM-PAC PT "6 Clicks" Daily Activity  Outcome Measure  Difficulty turning over in bed (including adjusting bedclothes, sheets and blankets)?: Unable Difficulty moving from lying on back to sitting on the side of the bed? : Unable Difficulty sitting down on and standing up from a chair with arms (e.g., wheelchair, bedside commode, etc,.)?: Unable Help needed moving to and from a bed to chair (including a wheelchair)?: A Lot Help needed walking in hospital room?: A Lot Help needed climbing 3-5 steps with a railing? : Total 6 Click Score: 8    End of Session Equipment Utilized During Treatment: Gait belt Activity Tolerance: Patient tolerated treatment well Patient left: in chair;with call bell/phone within reach;with chair alarm set;with family/visitor present(NP from palliative present) Nurse Communication: Mobility status PT Visit Diagnosis: Muscle weakness (generalized) (M62.81);Difficulty in walking, not elsewhere classified (R26.2)     Time: 7096-2836 PT Time Calculation (min) (ACUTE ONLY): 33 min  Charges:  $Gait Training: 8-22 mins $Therapeutic Activity: 8-22 mins                    G Codes:       Kittie Plater, PT, DPT Pager #: [phone] Office #: [phone]    [place] 11/01/2017, 11:49 AM

## 2017-11-02 DIAGNOSIS — I4891 Unspecified atrial fibrillation: Secondary | ICD-10-CM | POA: Diagnosis not present

## 2017-11-02 DIAGNOSIS — R109 Unspecified abdominal pain: Secondary | ICD-10-CM | POA: Diagnosis not present

## 2017-11-02 DIAGNOSIS — E139 Other specified diabetes mellitus without complications: Secondary | ICD-10-CM | POA: Diagnosis not present

## 2017-11-02 DIAGNOSIS — F322 Major depressive disorder, single episode, severe without psychotic features: Secondary | ICD-10-CM | POA: Diagnosis not present

## 2017-11-02 DIAGNOSIS — J449 Chronic obstructive pulmonary disease, unspecified: Secondary | ICD-10-CM | POA: Diagnosis not present

## 2017-11-02 DIAGNOSIS — M25579 Pain in unspecified ankle and joints of unspecified foot: Secondary | ICD-10-CM | POA: Diagnosis not present

## 2017-11-02 DIAGNOSIS — I10 Essential (primary) hypertension: Secondary | ICD-10-CM | POA: Diagnosis not present

## 2017-11-02 DIAGNOSIS — N390 Urinary tract infection, site not specified: Secondary | ICD-10-CM | POA: Diagnosis not present

## 2017-11-12 ENCOUNTER — Ambulatory Visit: Payer: Self-pay | Admitting: Podiatry

## 2017-11-16 DIAGNOSIS — J449 Chronic obstructive pulmonary disease, unspecified: Secondary | ICD-10-CM | POA: Diagnosis not present

## 2017-11-16 DIAGNOSIS — I700 Atherosclerosis of aorta: Secondary | ICD-10-CM | POA: Diagnosis not present

## 2017-11-16 DIAGNOSIS — K589 Irritable bowel syndrome without diarrhea: Secondary | ICD-10-CM | POA: Diagnosis not present

## 2017-11-16 DIAGNOSIS — M48061 Spinal stenosis, lumbar region without neurogenic claudication: Secondary | ICD-10-CM | POA: Diagnosis not present

## 2017-11-16 DIAGNOSIS — I10 Essential (primary) hypertension: Secondary | ICD-10-CM | POA: Diagnosis not present

## 2017-11-16 DIAGNOSIS — E119 Type 2 diabetes mellitus without complications: Secondary | ICD-10-CM | POA: Diagnosis not present

## 2017-11-16 DIAGNOSIS — I482 Chronic atrial fibrillation: Secondary | ICD-10-CM | POA: Diagnosis not present

## 2017-11-16 DIAGNOSIS — K2950 Unspecified chronic gastritis without bleeding: Secondary | ICD-10-CM | POA: Diagnosis not present

## 2017-11-16 DIAGNOSIS — K861 Other chronic pancreatitis: Secondary | ICD-10-CM | POA: Diagnosis not present

## 2017-11-17 ENCOUNTER — Other Ambulatory Visit: Payer: Self-pay | Admitting: *Deleted

## 2017-11-17 DIAGNOSIS — I10 Essential (primary) hypertension: Secondary | ICD-10-CM | POA: Diagnosis not present

## 2017-11-17 DIAGNOSIS — K589 Irritable bowel syndrome without diarrhea: Secondary | ICD-10-CM | POA: Diagnosis not present

## 2017-11-17 DIAGNOSIS — K2950 Unspecified chronic gastritis without bleeding: Secondary | ICD-10-CM | POA: Diagnosis not present

## 2017-11-17 DIAGNOSIS — K861 Other chronic pancreatitis: Secondary | ICD-10-CM | POA: Diagnosis not present

## 2017-11-17 DIAGNOSIS — E119 Type 2 diabetes mellitus without complications: Secondary | ICD-10-CM | POA: Diagnosis not present

## 2017-11-17 DIAGNOSIS — I482 Chronic atrial fibrillation: Secondary | ICD-10-CM | POA: Diagnosis not present

## 2017-11-17 NOTE — Patient Outreach (Signed)
[facility] [facility]) Care Management  11/17/2017  [name] [name] 1930/07/07 [id number]  Transition of care (Initial)  Initial outreach attempt as RN introduced [facility] to the pt's spouse who requested RN to call back due to pt currently working with her therapist for PT services. RN will attempt another call later today for pending services.  Raina Mina, RN Care Management Coordinator [place] Office [phone]

## 2017-11-18 ENCOUNTER — Other Ambulatory Visit: Payer: Self-pay | Admitting: *Deleted

## 2017-11-18 DIAGNOSIS — J449 Chronic obstructive pulmonary disease, unspecified: Secondary | ICD-10-CM | POA: Diagnosis not present

## 2017-11-18 DIAGNOSIS — I482 Chronic atrial fibrillation: Secondary | ICD-10-CM | POA: Diagnosis not present

## 2017-11-18 DIAGNOSIS — Z7901 Long term (current) use of anticoagulants: Secondary | ICD-10-CM | POA: Diagnosis not present

## 2017-11-18 DIAGNOSIS — M545 Low back pain: Secondary | ICD-10-CM | POA: Diagnosis not present

## 2017-11-18 DIAGNOSIS — N390 Urinary tract infection, site not specified: Secondary | ICD-10-CM | POA: Diagnosis not present

## 2017-11-18 DIAGNOSIS — I10 Essential (primary) hypertension: Secondary | ICD-10-CM | POA: Diagnosis not present

## 2017-11-18 DIAGNOSIS — E1151 Type 2 diabetes mellitus with diabetic peripheral angiopathy without gangrene: Secondary | ICD-10-CM | POA: Diagnosis not present

## 2017-11-18 DIAGNOSIS — D6489 Other specified anemias: Secondary | ICD-10-CM | POA: Diagnosis not present

## 2017-11-18 DIAGNOSIS — K219 Gastro-esophageal reflux disease without esophagitis: Secondary | ICD-10-CM | POA: Diagnosis not present

## 2017-11-18 NOTE — Patient Outreach (Signed)
[facility] [facility]) Care Management  11/18/2017  [name] [name] 06-May-1930 [id number]    Transition of care   RN was informed to call back later yesterday however was not able to return a call. Therefore a call was made today however unsuccessful but RN able to leave a HIPAA approved voice message. Will attempt once again on tomorrow prior to sending an outreach letter.  Raina Mina, RN Care Management Coordinator [place] Office [phone]

## 2017-11-19 ENCOUNTER — Other Ambulatory Visit: Payer: Self-pay | Admitting: *Deleted

## 2017-11-19 DIAGNOSIS — K589 Irritable bowel syndrome without diarrhea: Secondary | ICD-10-CM | POA: Diagnosis not present

## 2017-11-19 DIAGNOSIS — K2950 Unspecified chronic gastritis without bleeding: Secondary | ICD-10-CM | POA: Diagnosis not present

## 2017-11-19 DIAGNOSIS — I482 Chronic atrial fibrillation: Secondary | ICD-10-CM | POA: Diagnosis not present

## 2017-11-19 DIAGNOSIS — K861 Other chronic pancreatitis: Secondary | ICD-10-CM | POA: Diagnosis not present

## 2017-11-19 DIAGNOSIS — I10 Essential (primary) hypertension: Secondary | ICD-10-CM | POA: Diagnosis not present

## 2017-11-19 DIAGNOSIS — E119 Type 2 diabetes mellitus without complications: Secondary | ICD-10-CM | POA: Diagnosis not present

## 2017-11-19 NOTE — Patient Outreach (Signed)
[place] [facility]) Care Management  11/19/2017  Jazmon Kos 06-Aug-1930 [id number]    RN spoke with pt's spouse who indicated they were on their way out for the evening and requested another call back on Momday at 3 pm. RN will call pt back on next Monday in a attempt to completed a transition of care call.   Raina Mina, RN Care Management Coordinator [place] Office [phone]

## 2017-11-22 ENCOUNTER — Encounter: Payer: Self-pay | Admitting: *Deleted

## 2017-11-22 ENCOUNTER — Other Ambulatory Visit: Payer: Self-pay | Admitting: *Deleted

## 2017-11-22 NOTE — Patient Outreach (Signed)
[place] [facility]) Care Management  11/22/2017  [name] [name] 1930/08/24 [id number]    Transition of care  Several unsuccessful outreach calls so RN will send outreach letter and attempt one last contact in 10 days to allow pt to return a call for pending services.  Raina Mina, RN Care Management Coordinator [place] Office [phone]

## 2017-11-23 ENCOUNTER — Other Ambulatory Visit: Payer: Self-pay | Admitting: *Deleted

## 2017-11-23 ENCOUNTER — Encounter: Payer: Self-pay | Admitting: *Deleted

## 2017-11-23 NOTE — Patient Outreach (Signed)
[facility]) Care Management  11/23/2017  Tache Bobst 06-Feb-1930 [id number]   Transition of care (post D/C SNF)  RN spoke with pt who provider permission to speak with her spouse [name]) after identifiers confirmed. RN able to complete the transition of care template, review all medications and introduce all [facility] services available to assist. RN explained all of the pt's medical condition and verified no current issues or problems related specifically to the following:  COPD: No problems or symptoms and pt and caregiver was educated on the COPD action plan and what to do if acute symptoms occur. Pt remains in the GREEN zone. Pt takes Symbicort. ATRIAL FIBRILLATION: No problems encountered (controlled) DIABETES: CBGs average AM fasting 130 on Glimepiride and Lantus HYPERTENSION: "always been very good"  Spouse reports only ssues are related to incontinent with GI/GU as pt wears incontinent diapers/pads. Caregiver indicated pt currently taking medication for this problem. Pt has hired help to assist every other week to assist with household work and knows a Engineer, structural nearby if needed. Caregiver states pt has everything in order right now and does not need [facility] services at this time but very appreciative for the inquire. Spouse inquired about how to find out about resources in the community if needed in the future as RN provider [employer] 211 for such assistance. Also inquired if his insurance company has a Doctor, general practice. RN encouraged spouse to contact [employer] and inquire further on that request. RN also informed spouse if pt develops any needs or has any inquires concerning [facility] services in there future to contact the [facility] office directly or call this RN case manager (verbalized an understanding). RN has informed spouse and pt that her provider will be notified that at this time she has no needs to address and will not be active for [facility] services based upon the conversation  today. Spouse agreed and expressed how appreciative he was for the contact today and services offered. No needs case will be closed and provider notified.  Patient was recently discharged from hospital and all medications have been reviewed.  Raina Mina, RN Care Management Coordinator [place] Office [phone]

## 2017-11-24 DIAGNOSIS — K2950 Unspecified chronic gastritis without bleeding: Secondary | ICD-10-CM | POA: Diagnosis not present

## 2017-11-24 DIAGNOSIS — E119 Type 2 diabetes mellitus without complications: Secondary | ICD-10-CM | POA: Diagnosis not present

## 2017-11-24 DIAGNOSIS — I482 Chronic atrial fibrillation: Secondary | ICD-10-CM | POA: Diagnosis not present

## 2017-11-24 DIAGNOSIS — K861 Other chronic pancreatitis: Secondary | ICD-10-CM | POA: Diagnosis not present

## 2017-11-24 DIAGNOSIS — I10 Essential (primary) hypertension: Secondary | ICD-10-CM | POA: Diagnosis not present

## 2017-11-24 DIAGNOSIS — K589 Irritable bowel syndrome without diarrhea: Secondary | ICD-10-CM | POA: Diagnosis not present

## 2017-11-26 DIAGNOSIS — E119 Type 2 diabetes mellitus without complications: Secondary | ICD-10-CM | POA: Diagnosis not present

## 2017-11-26 DIAGNOSIS — K2950 Unspecified chronic gastritis without bleeding: Secondary | ICD-10-CM | POA: Diagnosis not present

## 2017-11-26 DIAGNOSIS — K861 Other chronic pancreatitis: Secondary | ICD-10-CM | POA: Diagnosis not present

## 2017-11-26 DIAGNOSIS — I10 Essential (primary) hypertension: Secondary | ICD-10-CM | POA: Diagnosis not present

## 2017-11-26 DIAGNOSIS — K589 Irritable bowel syndrome without diarrhea: Secondary | ICD-10-CM | POA: Diagnosis not present

## 2017-11-26 DIAGNOSIS — I482 Chronic atrial fibrillation: Secondary | ICD-10-CM | POA: Diagnosis not present

## 2017-11-29 DIAGNOSIS — E119 Type 2 diabetes mellitus without complications: Secondary | ICD-10-CM | POA: Diagnosis not present

## 2017-11-29 DIAGNOSIS — I482 Chronic atrial fibrillation: Secondary | ICD-10-CM | POA: Diagnosis not present

## 2017-11-29 DIAGNOSIS — K861 Other chronic pancreatitis: Secondary | ICD-10-CM | POA: Diagnosis not present

## 2017-11-29 DIAGNOSIS — K2950 Unspecified chronic gastritis without bleeding: Secondary | ICD-10-CM | POA: Diagnosis not present

## 2017-11-29 DIAGNOSIS — K589 Irritable bowel syndrome without diarrhea: Secondary | ICD-10-CM | POA: Diagnosis not present

## 2017-11-29 DIAGNOSIS — I10 Essential (primary) hypertension: Secondary | ICD-10-CM | POA: Diagnosis not present

## 2017-11-30 ENCOUNTER — Ambulatory Visit: Payer: Medicare HMO | Admitting: Podiatry

## 2017-11-30 ENCOUNTER — Encounter: Payer: Self-pay | Admitting: Podiatry

## 2017-11-30 DIAGNOSIS — G629 Polyneuropathy, unspecified: Secondary | ICD-10-CM | POA: Diagnosis not present

## 2017-11-30 DIAGNOSIS — K2950 Unspecified chronic gastritis without bleeding: Secondary | ICD-10-CM | POA: Diagnosis not present

## 2017-11-30 DIAGNOSIS — I482 Chronic atrial fibrillation: Secondary | ICD-10-CM | POA: Diagnosis not present

## 2017-11-30 DIAGNOSIS — B351 Tinea unguium: Secondary | ICD-10-CM

## 2017-11-30 DIAGNOSIS — K861 Other chronic pancreatitis: Secondary | ICD-10-CM | POA: Diagnosis not present

## 2017-11-30 DIAGNOSIS — M79676 Pain in unspecified toe(s): Secondary | ICD-10-CM

## 2017-11-30 DIAGNOSIS — E119 Type 2 diabetes mellitus without complications: Secondary | ICD-10-CM | POA: Diagnosis not present

## 2017-11-30 DIAGNOSIS — I10 Essential (primary) hypertension: Secondary | ICD-10-CM | POA: Diagnosis not present

## 2017-11-30 DIAGNOSIS — K589 Irritable bowel syndrome without diarrhea: Secondary | ICD-10-CM | POA: Diagnosis not present

## 2017-11-30 NOTE — Progress Notes (Signed)
Patient ID: [name] [name], female   DOB: [birth year], 84 y.o.   MRN: [id number]  Subjective: 84 y.o.-year-old female returns the office today for painful, elongated, thickened toenails which she is unable to trim herself. Denies any redness or drainage around the nails. Denies any acute changes since last appointment and no new complaints today. Denies any systemic complaints such as fevers, chills, nausea, vomiting.   Objective: NAD; presents with her husband who also has no new concerns. DP/PT pulses 1/4 bilaterally, CRT less than 3 seconds Protective sensation decreased with Simms Weinstein monofilament Nails hypertrophic, dystrophic, elongated, brittle, discolored 10. There is tenderness overlying the nails 1-5 bilaterally. There is no surrounding erythema or drainage along the nail sites. No open lesions or pre-ulcerative lesions are identified. No other areas of tenderness bilateral lower extremities. No overlying edema, erythema, increased warmth. No pain with calf compression, swelling, warmth, erythema.  Assessment: Patient presents with symptomatic onychomycosis  Plan: -Treatment options including alternatives, risks, complications were discussed -Nails sharply debrided 10 without complication/bleeding. -Discussed daily foot inspection. If there are any changes, to call the office immediately.  -Follow-up in 3 months or sooner if any problems are to arise. In the meantime, encouraged to call the office with any questions, concerns, changes symptoms.  Celesta Gentile, DPM

## 2017-12-02 DIAGNOSIS — E119 Type 2 diabetes mellitus without complications: Secondary | ICD-10-CM | POA: Diagnosis not present

## 2017-12-02 DIAGNOSIS — K589 Irritable bowel syndrome without diarrhea: Secondary | ICD-10-CM | POA: Diagnosis not present

## 2017-12-02 DIAGNOSIS — K2950 Unspecified chronic gastritis without bleeding: Secondary | ICD-10-CM | POA: Diagnosis not present

## 2017-12-02 DIAGNOSIS — I482 Chronic atrial fibrillation: Secondary | ICD-10-CM | POA: Diagnosis not present

## 2017-12-02 DIAGNOSIS — I10 Essential (primary) hypertension: Secondary | ICD-10-CM | POA: Diagnosis not present

## 2017-12-02 DIAGNOSIS — K861 Other chronic pancreatitis: Secondary | ICD-10-CM | POA: Diagnosis not present

## 2017-12-03 ENCOUNTER — Ambulatory Visit: Payer: Self-pay | Admitting: *Deleted

## 2017-12-07 DIAGNOSIS — I10 Essential (primary) hypertension: Secondary | ICD-10-CM | POA: Diagnosis not present

## 2017-12-07 DIAGNOSIS — I482 Chronic atrial fibrillation: Secondary | ICD-10-CM | POA: Diagnosis not present

## 2017-12-07 DIAGNOSIS — K861 Other chronic pancreatitis: Secondary | ICD-10-CM | POA: Diagnosis not present

## 2017-12-07 DIAGNOSIS — K2950 Unspecified chronic gastritis without bleeding: Secondary | ICD-10-CM | POA: Diagnosis not present

## 2017-12-07 DIAGNOSIS — K589 Irritable bowel syndrome without diarrhea: Secondary | ICD-10-CM | POA: Diagnosis not present

## 2017-12-07 DIAGNOSIS — E119 Type 2 diabetes mellitus without complications: Secondary | ICD-10-CM | POA: Diagnosis not present

## 2017-12-09 DIAGNOSIS — K589 Irritable bowel syndrome without diarrhea: Secondary | ICD-10-CM | POA: Diagnosis not present

## 2017-12-09 DIAGNOSIS — I482 Chronic atrial fibrillation: Secondary | ICD-10-CM | POA: Diagnosis not present

## 2017-12-09 DIAGNOSIS — K2950 Unspecified chronic gastritis without bleeding: Secondary | ICD-10-CM | POA: Diagnosis not present

## 2017-12-09 DIAGNOSIS — E119 Type 2 diabetes mellitus without complications: Secondary | ICD-10-CM | POA: Diagnosis not present

## 2017-12-09 DIAGNOSIS — K861 Other chronic pancreatitis: Secondary | ICD-10-CM | POA: Diagnosis not present

## 2017-12-09 DIAGNOSIS — I10 Essential (primary) hypertension: Secondary | ICD-10-CM | POA: Diagnosis not present

## 2017-12-11 ENCOUNTER — Other Ambulatory Visit: Payer: Self-pay | Admitting: Cardiology

## 2017-12-16 DIAGNOSIS — K861 Other chronic pancreatitis: Secondary | ICD-10-CM | POA: Diagnosis not present

## 2017-12-16 DIAGNOSIS — Z7901 Long term (current) use of anticoagulants: Secondary | ICD-10-CM | POA: Diagnosis not present

## 2017-12-16 DIAGNOSIS — E1151 Type 2 diabetes mellitus with diabetic peripheral angiopathy without gangrene: Secondary | ICD-10-CM | POA: Diagnosis not present

## 2017-12-16 DIAGNOSIS — Z6827 Body mass index (BMI) 27.0-27.9, adult: Secondary | ICD-10-CM | POA: Diagnosis not present

## 2017-12-16 DIAGNOSIS — E119 Type 2 diabetes mellitus without complications: Secondary | ICD-10-CM | POA: Diagnosis not present

## 2017-12-16 DIAGNOSIS — K589 Irritable bowel syndrome without diarrhea: Secondary | ICD-10-CM | POA: Diagnosis not present

## 2017-12-16 DIAGNOSIS — K2950 Unspecified chronic gastritis without bleeding: Secondary | ICD-10-CM | POA: Diagnosis not present

## 2017-12-16 DIAGNOSIS — I482 Chronic atrial fibrillation: Secondary | ICD-10-CM | POA: Diagnosis not present

## 2017-12-16 DIAGNOSIS — I10 Essential (primary) hypertension: Secondary | ICD-10-CM | POA: Diagnosis not present

## 2017-12-21 DIAGNOSIS — I482 Chronic atrial fibrillation: Secondary | ICD-10-CM | POA: Diagnosis not present

## 2017-12-21 DIAGNOSIS — E119 Type 2 diabetes mellitus without complications: Secondary | ICD-10-CM | POA: Diagnosis not present

## 2017-12-21 DIAGNOSIS — K2950 Unspecified chronic gastritis without bleeding: Secondary | ICD-10-CM | POA: Diagnosis not present

## 2017-12-21 DIAGNOSIS — I10 Essential (primary) hypertension: Secondary | ICD-10-CM | POA: Diagnosis not present

## 2017-12-21 DIAGNOSIS — K589 Irritable bowel syndrome without diarrhea: Secondary | ICD-10-CM | POA: Diagnosis not present

## 2017-12-21 DIAGNOSIS — K861 Other chronic pancreatitis: Secondary | ICD-10-CM | POA: Diagnosis not present

## 2017-12-28 DIAGNOSIS — H353133 Nonexudative age-related macular degeneration, bilateral, advanced atrophic without subfoveal involvement: Secondary | ICD-10-CM | POA: Diagnosis not present

## 2018-01-04 ENCOUNTER — Other Ambulatory Visit: Payer: Self-pay | Admitting: Cardiology

## 2018-01-04 MED ORDER — DILTIAZEM HCL ER 120 MG PO CP24: ORAL_CAPSULE | ORAL | 1 refills | Status: AC

## 2018-01-04 NOTE — Telephone Encounter (Signed)
Rx(s) sent to pharmacy electronically.  

## 2018-01-05 ENCOUNTER — Telehealth: Payer: Self-pay | Admitting: Internal Medicine

## 2018-01-05 NOTE — Telephone Encounter (Signed)
I spoke with her husband and encouraged him to keep the appt as we have not seen her in more than a year.  He agrees and will keep appt on 01/07/18 at 1:45pm.

## 2018-01-06 ENCOUNTER — Inpatient Hospital Stay (HOSPITAL_COMMUNITY): Payer: Medicare HMO

## 2018-01-06 ENCOUNTER — Emergency Department (HOSPITAL_COMMUNITY): Payer: Medicare HMO

## 2018-01-06 ENCOUNTER — Inpatient Hospital Stay (HOSPITAL_COMMUNITY)
Admission: EM | Admit: 2018-01-06 | Discharge: 2018-01-11 | DRG: 470 | Disposition: A | Discharge status: Skilled Nursing Facility | Payer: Medicare HMO | Attending: Internal Medicine | Admitting: Internal Medicine

## 2018-01-06 ENCOUNTER — Other Ambulatory Visit: Payer: Self-pay

## 2018-01-06 DIAGNOSIS — R69 Illness, unspecified: Secondary | ICD-10-CM | POA: Diagnosis not present

## 2018-01-06 DIAGNOSIS — R05 Cough: Secondary | ICD-10-CM | POA: Diagnosis not present

## 2018-01-06 DIAGNOSIS — R2681 Unsteadiness on feet: Secondary | ICD-10-CM | POA: Diagnosis not present

## 2018-01-06 DIAGNOSIS — S72142A Displaced intertrochanteric fracture of left femur, initial encounter for closed fracture: Secondary | ICD-10-CM | POA: Diagnosis not present

## 2018-01-06 DIAGNOSIS — Z471 Aftercare following joint replacement surgery: Secondary | ICD-10-CM | POA: Diagnosis not present

## 2018-01-06 DIAGNOSIS — E1142 Type 2 diabetes mellitus with diabetic polyneuropathy: Secondary | ICD-10-CM | POA: Diagnosis not present

## 2018-01-06 DIAGNOSIS — Z7984 Long term (current) use of oral hypoglycemic drugs: Secondary | ICD-10-CM | POA: Diagnosis not present

## 2018-01-06 DIAGNOSIS — R278 Other lack of coordination: Secondary | ICD-10-CM | POA: Diagnosis not present

## 2018-01-06 DIAGNOSIS — D72829 Elevated white blood cell count, unspecified: Secondary | ICD-10-CM

## 2018-01-06 DIAGNOSIS — Z96642 Presence of left artificial hip joint: Secondary | ICD-10-CM | POA: Diagnosis not present

## 2018-01-06 DIAGNOSIS — R5383 Other fatigue: Secondary | ICD-10-CM

## 2018-01-06 DIAGNOSIS — Z7901 Long term (current) use of anticoagulants: Secondary | ICD-10-CM

## 2018-01-06 DIAGNOSIS — W010XXA Fall on same level from slipping, tripping and stumbling without subsequent striking against object, initial encounter: Secondary | ICD-10-CM | POA: Diagnosis not present

## 2018-01-06 DIAGNOSIS — Z79899 Other long term (current) drug therapy: Secondary | ICD-10-CM | POA: Diagnosis not present

## 2018-01-06 DIAGNOSIS — K219 Gastro-esophageal reflux disease without esophagitis: Secondary | ICD-10-CM | POA: Diagnosis present

## 2018-01-06 DIAGNOSIS — S79911A Unspecified injury of right hip, initial encounter: Secondary | ICD-10-CM | POA: Diagnosis not present

## 2018-01-06 DIAGNOSIS — N3289 Other specified disorders of bladder: Secondary | ICD-10-CM | POA: Diagnosis present

## 2018-01-06 DIAGNOSIS — E7800 Pure hypercholesterolemia, unspecified: Secondary | ICD-10-CM | POA: Diagnosis present

## 2018-01-06 DIAGNOSIS — F419 Anxiety disorder, unspecified: Secondary | ICD-10-CM | POA: Diagnosis present

## 2018-01-06 DIAGNOSIS — T148XXA Other injury of unspecified body region, initial encounter: Secondary | ICD-10-CM | POA: Diagnosis not present

## 2018-01-06 DIAGNOSIS — E1122 Type 2 diabetes mellitus with diabetic chronic kidney disease: Secondary | ICD-10-CM | POA: Diagnosis present

## 2018-01-06 DIAGNOSIS — D62 Acute posthemorrhagic anemia: Secondary | ICD-10-CM | POA: Diagnosis not present

## 2018-01-06 DIAGNOSIS — Y92009 Unspecified place in unspecified non-institutional (private) residence as the place of occurrence of the external cause: Secondary | ICD-10-CM

## 2018-01-06 DIAGNOSIS — I700 Atherosclerosis of aorta: Secondary | ICD-10-CM | POA: Diagnosis not present

## 2018-01-06 DIAGNOSIS — H409 Unspecified glaucoma: Secondary | ICD-10-CM | POA: Diagnosis present

## 2018-01-06 DIAGNOSIS — W19XXXA Unspecified fall, initial encounter: Secondary | ICD-10-CM

## 2018-01-06 DIAGNOSIS — S72142D Displaced intertrochanteric fracture of left femur, subsequent encounter for closed fracture with routine healing: Secondary | ICD-10-CM | POA: Diagnosis not present

## 2018-01-06 DIAGNOSIS — E119 Type 2 diabetes mellitus without complications: Secondary | ICD-10-CM

## 2018-01-06 DIAGNOSIS — I4891 Unspecified atrial fibrillation: Secondary | ICD-10-CM | POA: Diagnosis present

## 2018-01-06 DIAGNOSIS — N183 Chronic kidney disease, stage 3 (moderate): Secondary | ICD-10-CM | POA: Diagnosis present

## 2018-01-06 DIAGNOSIS — E1151 Type 2 diabetes mellitus with diabetic peripheral angiopathy without gangrene: Secondary | ICD-10-CM | POA: Diagnosis not present

## 2018-01-06 DIAGNOSIS — S72002A Fracture of unspecified part of neck of left femur, initial encounter for closed fracture: Secondary | ICD-10-CM | POA: Diagnosis not present

## 2018-01-06 DIAGNOSIS — R41841 Cognitive communication deficit: Secondary | ICD-10-CM | POA: Diagnosis not present

## 2018-01-06 DIAGNOSIS — E1165 Type 2 diabetes mellitus with hyperglycemia: Secondary | ICD-10-CM | POA: Diagnosis not present

## 2018-01-06 DIAGNOSIS — G8911 Acute pain due to trauma: Secondary | ICD-10-CM | POA: Diagnosis not present

## 2018-01-06 DIAGNOSIS — I340 Nonrheumatic mitral (valve) insufficiency: Secondary | ICD-10-CM | POA: Diagnosis not present

## 2018-01-06 DIAGNOSIS — S72009A Fracture of unspecified part of neck of unspecified femur, initial encounter for closed fracture: Secondary | ICD-10-CM

## 2018-01-06 DIAGNOSIS — J449 Chronic obstructive pulmonary disease, unspecified: Secondary | ICD-10-CM | POA: Diagnosis present

## 2018-01-06 DIAGNOSIS — Z4789 Encounter for other orthopedic aftercare: Secondary | ICD-10-CM | POA: Diagnosis not present

## 2018-01-06 DIAGNOSIS — I10 Essential (primary) hypertension: Secondary | ICD-10-CM | POA: Diagnosis not present

## 2018-01-06 DIAGNOSIS — Z794 Long term (current) use of insulin: Secondary | ICD-10-CM | POA: Diagnosis not present

## 2018-01-06 DIAGNOSIS — E1140 Type 2 diabetes mellitus with diabetic neuropathy, unspecified: Secondary | ICD-10-CM

## 2018-01-06 DIAGNOSIS — E875 Hyperkalemia: Secondary | ICD-10-CM | POA: Diagnosis present

## 2018-01-06 DIAGNOSIS — S72032A Displaced midcervical fracture of left femur, initial encounter for closed fracture: Secondary | ICD-10-CM | POA: Diagnosis not present

## 2018-01-06 DIAGNOSIS — M79605 Pain in left leg: Secondary | ICD-10-CM | POA: Diagnosis not present

## 2018-01-06 DIAGNOSIS — N390 Urinary tract infection, site not specified: Secondary | ICD-10-CM | POA: Diagnosis present

## 2018-01-06 DIAGNOSIS — Z09 Encounter for follow-up examination after completed treatment for conditions other than malignant neoplasm: Secondary | ICD-10-CM

## 2018-01-06 DIAGNOSIS — I482 Chronic atrial fibrillation: Secondary | ICD-10-CM | POA: Diagnosis present

## 2018-01-06 DIAGNOSIS — M25552 Pain in left hip: Secondary | ICD-10-CM | POA: Diagnosis not present

## 2018-01-06 DIAGNOSIS — I129 Hypertensive chronic kidney disease with stage 1 through stage 4 chronic kidney disease, or unspecified chronic kidney disease: Secondary | ICD-10-CM | POA: Diagnosis not present

## 2018-01-06 DIAGNOSIS — J9811 Atelectasis: Secondary | ICD-10-CM | POA: Diagnosis not present

## 2018-01-06 DIAGNOSIS — R059 Cough, unspecified: Secondary | ICD-10-CM

## 2018-01-06 DIAGNOSIS — N3000 Acute cystitis without hematuria: Secondary | ICD-10-CM | POA: Diagnosis not present

## 2018-01-06 DIAGNOSIS — S0990XA Unspecified injury of head, initial encounter: Secondary | ICD-10-CM | POA: Diagnosis not present

## 2018-01-06 DIAGNOSIS — S72042A Displaced fracture of base of neck of left femur, initial encounter for closed fracture: Secondary | ICD-10-CM | POA: Diagnosis not present

## 2018-01-06 LAB — URINALYSIS, ROUTINE W REFLEX MICROSCOPIC
BILIRUBIN URINE: NEGATIVE
Glucose, UA: NEGATIVE mg/dL
Hgb urine dipstick: NEGATIVE
KETONES UR: NEGATIVE mg/dL
NITRITE: NEGATIVE
PH: 7 (ref 5.0–8.0)
Protein, ur: 100 mg/dL — AB
SPECIFIC GRAVITY, URINE: 1.008 (ref 1.005–1.030)

## 2018-01-06 LAB — CBC WITH DIFFERENTIAL/PLATELET
Basophils Absolute: 0 10*3/uL (ref 0.0–0.1)
Basophils Relative: 0 %
Eosinophils Absolute: 0.1 10*3/uL (ref 0.0–0.7)
Eosinophils Relative: 0 %
HCT: 45.5 % (ref 36.0–46.0)
Hemoglobin: 15.5 g/dL — ABNORMAL HIGH (ref 12.0–15.0)
Lymphocytes Relative: 12 %
Lymphs Abs: 2.4 10*3/uL (ref 0.7–4.0)
MCH: 31.3 pg (ref 26.0–34.0)
MCHC: 34.1 g/dL (ref 30.0–36.0)
MCV: 91.7 fL (ref 78.0–100.0)
Monocytes Absolute: 0.8 10*3/uL (ref 0.1–1.0)
Monocytes Relative: 4 %
Neutro Abs: 15.9 10*3/uL — ABNORMAL HIGH (ref 1.7–7.7)
Neutrophils Relative %: 84 %
Platelets: 252 10*3/uL (ref 150–400)
RBC: 4.96 MIL/uL (ref 3.87–5.11)
RDW: 13.3 % (ref 11.5–15.5)
WBC: 19.2 10*3/uL — ABNORMAL HIGH (ref 4.0–10.5)

## 2018-01-06 LAB — BASIC METABOLIC PANEL
Anion gap: 11 (ref 5–15)
BUN: 11 mg/dL (ref 6–20)
CHLORIDE: 101 mmol/L (ref 101–111)
CO2: 26 mmol/L (ref 22–32)
CREATININE: 0.82 mg/dL (ref 0.44–1.00)
Calcium: 9.6 mg/dL (ref 8.9–10.3)
GFR calc Af Amer: >60 mL/min (ref >60)
GLUCOSE: 148 mg/dL — AB (ref 65–99)
POTASSIUM: 5.3 mmol/L — AB (ref 3.5–5.1)
Sodium: 138 mmol/L (ref 135–145)

## 2018-01-06 LAB — PROTIME-INR
INR: 2.06
Prothrombin Time: 23.1 seconds — ABNORMAL HIGH (ref 11.4–15.2)

## 2018-01-06 MED ORDER — METHYLPHENIDATE HCL 5 MG PO TABS
5.0000 mg | ORAL_TABLET | Freq: Every day | ORAL | Status: DC
  Administered 2018-01-08 – 2018-01-11 (×4): 5 mg via ORAL
  Filled 2018-01-06 (×4): qty 1

## 2018-01-06 MED ORDER — DILTIAZEM HCL ER COATED BEADS 120 MG PO CP24
120.0000 mg | ORAL_CAPSULE | Freq: Every day | ORAL | Status: DC
  Administered 2018-01-07 – 2018-01-11 (×5): 120 mg via ORAL
  Filled 2018-01-06 (×8): qty 1

## 2018-01-06 MED ORDER — MAGNESIUM CITRATE PO SOLN
1.0000 | Freq: Every day | ORAL | Status: DC | PRN
Start: 2018-01-06 — End: 2018-01-11

## 2018-01-06 MED ORDER — POLYETHYLENE GLYCOL 3350 17 G PO PACK: 17.0000 g | PACK | Freq: Every day | ORAL | Status: DC | PRN

## 2018-01-06 MED ORDER — METOPROLOL TARTRATE 25 MG PO TABS
25.0000 mg | ORAL_TABLET | Freq: Two times a day (BID) | ORAL | Status: DC
  Administered 2018-01-07 – 2018-01-11 (×10): 25 mg via ORAL
  Filled 2018-01-06 (×10): qty 1

## 2018-01-06 MED ORDER — MORPHINE SULFATE (PF) 4 MG/ML IV SOLN
4.0000 mg | Freq: Once | INTRAVENOUS | Status: AC
  Administered 2018-01-06: 4 mg via INTRAVENOUS
  Filled 2018-01-06: qty 1

## 2018-01-06 MED ORDER — PROMETHAZINE HCL 25 MG PO TABS
25.0000 mg | ORAL_TABLET | Freq: Three times a day (TID) | ORAL | Status: DC | PRN
  Administered 2018-01-07: 25 mg via ORAL
  Filled 2018-01-06: qty 1

## 2018-01-06 MED ORDER — HYDROCODONE-ACETAMINOPHEN 5-325 MG PO TABS
1.0000 | ORAL_TABLET | Freq: Four times a day (QID) | ORAL | Status: DC | PRN
  Administered 2018-01-08: 2 via ORAL
  Filled 2018-01-06: qty 2

## 2018-01-06 MED ORDER — BISACODYL 5 MG PO TBEC: 5.0000 mg | DELAYED_RELEASE_TABLET | Freq: Every day | ORAL | Status: DC | PRN

## 2018-01-06 MED ORDER — LOPERAMIDE HCL 2 MG PO CAPS
2.0000 mg | ORAL_CAPSULE | ORAL | Status: DC | PRN
  Administered 2018-01-08 – 2018-01-10 (×3): 2 mg via ORAL
  Filled 2018-01-06 (×3): qty 1

## 2018-01-06 MED ORDER — IPRATROPIUM-ALBUTEROL 0.5-2.5 (3) MG/3ML IN SOLN: 3.0000 mL | RESPIRATORY_TRACT | Status: DC | PRN

## 2018-01-06 MED ORDER — DICYCLOMINE HCL 20 MG PO TABS: 10.0000 mg | ORAL_TABLET | Freq: Four times a day (QID) | ORAL | Status: DC | PRN

## 2018-01-06 MED ORDER — MORPHINE SULFATE (PF) 2 MG/ML IV SOLN
2.0000 mg | Freq: Once | INTRAVENOUS | Status: AC
  Administered 2018-01-06: 2 mg via INTRAVENOUS
  Filled 2018-01-06: qty 1

## 2018-01-06 MED ORDER — SODIUM CHLORIDE 0.9 % IV SOLN
INTRAVENOUS | Status: DC
  Administered 2018-01-07 (×2): via INTRAVENOUS
  Administered 2018-01-09: 50 mL/h via INTRAVENOUS
  Administered 2018-01-10: 18:00:00 via INTRAVENOUS

## 2018-01-06 MED ORDER — MOMETASONE FURO-FORMOTEROL FUM 200-5 MCG/ACT IN AERO
2.0000 | INHALATION_SPRAY | Freq: Two times a day (BID) | RESPIRATORY_TRACT | Status: DC
  Administered 2018-01-07 – 2018-01-11 (×7): 2 via RESPIRATORY_TRACT
  Filled 2018-01-06 (×2): qty 8.8

## 2018-01-06 MED ORDER — LORAZEPAM 1 MG PO TABS
1.0000 mg | ORAL_TABLET | Freq: Three times a day (TID) | ORAL | Status: DC | PRN
  Administered 2018-01-09: 1 mg via ORAL
  Filled 2018-01-06: qty 1

## 2018-01-06 MED ORDER — LATANOPROST 0.005 % OP SOLN
1.0000 [drp] | Freq: Every day | OPHTHALMIC | Status: DC
  Administered 2018-01-07 – 2018-01-10 (×4): 1 [drp] via OPHTHALMIC
  Filled 2018-01-06 (×2): qty 2.5

## 2018-01-06 MED ORDER — OXYBUTYNIN CHLORIDE 5 MG PO TABS
5.0000 mg | ORAL_TABLET | Freq: Every day | ORAL | Status: DC
  Administered 2018-01-07 – 2018-01-10 (×5): 5 mg via ORAL
  Filled 2018-01-06 (×5): qty 1

## 2018-01-06 MED ORDER — GABAPENTIN 100 MG PO CAPS
100.0000 mg | ORAL_CAPSULE | Freq: Three times a day (TID) | ORAL | Status: DC
  Administered 2018-01-07 – 2018-01-11 (×12): 100 mg via ORAL
  Filled 2018-01-06 (×12): qty 1

## 2018-01-06 MED ORDER — TIMOLOL MALEATE 0.5 % OP SOLN
1.0000 [drp] | Freq: Every day | OPHTHALMIC | Status: DC
  Administered 2018-01-08 – 2018-01-11 (×4): 1 [drp] via OPHTHALMIC
  Filled 2018-01-06 (×2): qty 5

## 2018-01-06 MED ORDER — MORPHINE SULFATE (PF) 2 MG/ML IV SOLN
0.5000 mg | INTRAVENOUS | Status: DC | PRN
  Administered 2018-01-07 – 2018-01-08 (×4): 0.5 mg via INTRAVENOUS
  Filled 2018-01-06 (×3): qty 1

## 2018-01-06 MED ORDER — INSULIN ASPART 100 UNIT/ML ~~LOC~~ SOLN: 0.0000 [IU] | Freq: Three times a day (TID) | SUBCUTANEOUS | Status: DC

## 2018-01-06 MED ORDER — INSULIN GLARGINE 100 UNIT/ML ~~LOC~~ SOLN
10.0000 [IU] | Freq: Every day | SUBCUTANEOUS | Status: DC
  Administered 2018-01-07 – 2018-01-10 (×4): 10 [IU] via SUBCUTANEOUS
  Filled 2018-01-06 (×8): qty 0.1

## 2018-01-06 MED ORDER — FAMOTIDINE 20 MG PO TABS
20.0000 mg | ORAL_TABLET | Freq: Two times a day (BID) | ORAL | Status: DC
  Administered 2018-01-07 – 2018-01-11 (×9): 20 mg via ORAL
  Filled 2018-01-06 (×10): qty 1

## 2018-01-06 MED ORDER — SUCRALFATE 1 G PO TABS
1.0000 g | ORAL_TABLET | Freq: Two times a day (BID) | ORAL | Status: DC
Start: 2018-01-06 — End: 2018-01-11
  Administered 2018-01-07 – 2018-01-11 (×9): 1 g via ORAL
  Filled 2018-01-06 (×9): qty 1

## 2018-01-06 MED ORDER — MIRTAZAPINE 15 MG PO TABS
7.5000 mg | ORAL_TABLET | Freq: Every day | ORAL | Status: DC
  Administered 2018-01-07 – 2018-01-10 (×5): 7.5 mg via ORAL
  Filled 2018-01-06 (×5): qty 1

## 2018-01-06 MED ORDER — PANTOPRAZOLE SODIUM 40 MG PO TBEC
80.0000 mg | DELAYED_RELEASE_TABLET | Freq: Every day | ORAL | Status: DC
Start: 2018-01-07 — End: 2018-01-11
  Administered 2018-01-08 – 2018-01-11 (×4): 80 mg via ORAL
  Filled 2018-01-06 (×3): qty 2

## 2018-01-06 MED ORDER — VITAMIN K1 10 MG/ML IJ SOLN
5.0000 mg | Freq: Once | INTRAVENOUS | Status: AC
  Administered 2018-01-06: 5 mg via INTRAVENOUS
  Filled 2018-01-06: qty 0.5

## 2018-01-06 MED ORDER — ADULT MULTIVITAMIN W/MINERALS CH
1.0000 | ORAL_TABLET | Freq: Every day | ORAL | Status: DC
  Administered 2018-01-08 – 2018-01-11 (×4): 1 via ORAL
  Filled 2018-01-06 (×4): qty 1

## 2018-01-06 NOTE — ED Notes (Signed)
Attempted blood draw x2- NT to try

## 2018-01-06 NOTE — ED Triage Notes (Signed)
Per EMS, patient comes from home, was not using her walker and lost balance and fell. No LOC, did not hit head, no neck or back pain, alert and oriented. Possible dislocated L hip. Good pms in LLE. VSS. 100 fent given. 20L hand. NSR. HX afib, COPD, DM. BS=186.

## 2018-01-06 NOTE — H&P (Addendum)
History and Physical    [name] [name] YTK:[id number] DOB: 04-30-1930 DOA: 01/06/2018  Referring MD/NP/PA: Dalia Heading PCP: [name], MD   Patient coming from: home with husband  Chief Complaint: Fall with hip pain  HPI: [name] [name] is a 84 y.o. female with history of chronic atrial fibrillation on Coumadin, diabetes mellitus type 2, hypertension, hyperlipidemia, COPD, C. difficile colitis, who presents with a mechanical fall and hip pain.  Patient ambulates with a walker at baseline due to balance problems but she practices with a walker at home.  Today, she was attempting to walk her closet to the bathroom she suddenly lost her balance and fell on her left side hitting her head and her left hip, for immediate pain in her left hip that was severe.  She was unable to get up without assistance.  She had to wait for her husband returned from walking her dog for help.  She has very severe pain in the left lateral with any sort of movement but feels relatively comfortable when resting still.  Last few days she has felt a little fatigued and has had a mild cough but has otherwise been feeling well.  She denies dysuria, increased urinary frequency or urgency, or fevers.    ED Course: Vital signs afebrile, mildly tachycardic, stable blood pressure, mildly hypoxic on room air at 86% which improved with 2 L nasal cannula.  Labs: Mild hyperkalemia at 5.3 (difficult stick), white blood cell count 19.2, INR 2.06.  X-ray of the hips demonstrated an acute left proximal femur fracture which appears to be a basocervical neck fracture possibly extending into the greater trochanter with some angulation.  ER physician discussed the case with Dr. Griffin Basil for orthopedic surgery who is asked that the patient be admitted over at [facility] for possible surgery tomorrow.  Review of Systems: Increased from 100 mg 3 times daily to 300 mg 3 times daily and she has not noticed any improvement in  her neuropathy.  Complete 12 point review of systems reviewed with patient and negative except as mentioned above.    Past Medical History:  Diagnosis Date  . Atrial fibrillation (HCC)    RVR hx.  Chronic Coumadin  . C. difficile colitis 09/21/2013, 11/2013  . Carotid stenosis    a. Carotid US (10/2013):  bilat 1-39%; f/u 1 year  . Chronic lower back pain   . COPD (chronic obstructive pulmonary disease) ([place])   . Depression   . GERD (gastroesophageal reflux disease)    with HH  . Glaucoma   . HCAP (healthcare-associated pneumonia) 11/21/2011  . High cholesterol   . Hypertension   . Metabolic encephalopathy 02/06/7321  . Mitral regurgitation 03/11/2012  . Pancreatitis    ~2008, 11/2011 post ERCP  . Pneumonia 12/2011   "first time I know about"  . Sepsis ([place]) 11/27/2013  . SIRS (systemic inflammatory response syndrome) ([place]) 03/13/2012   a/w post ERCP  pancreatitis.   . Type II diabetes mellitus ([place])     Past Surgical History:  Procedure Laterality Date  . APPENDECTOMY  ~ 1960  . CATARACT EXTRACTION W/ INTRAOCULAR LENS  IMPLANT, BILATERAL  2000's  . CHOLECYSTECTOMY  1990's   Lap chole  . COLONOSCOPY  01/2003   diverticulosis, 3mm hyperplastic sigmoid polyp  . DILATION AND CURETTAGE OF UTERUS    . ERCP  11/13/2011   Procedure: ENDOSCOPIC RETROGRADE CHOLANGIOPANCREATOGRAPHY (ERCP);  Surgeon: Beryle Beams, MD;  Location: [facility] ENDOSCOPY;  Service: Endoscopy;  Laterality: N/A;  .  FLEXIBLE SIGMOIDOSCOPY N/A 05/02/2014   Procedure: FLEXIBLE SIGMOIDOSCOPY;  Surgeon: Gatha Mayer, MD;  Location: [facility] ENDOSCOPY;  Service: Endoscopy;  Laterality: N/A;  . TONSILLECTOMY AND ADENOIDECTOMY  ~ 1939  . VAGINAL HYSTERECTOMY  ~ 1960's     reports that she has never smoked. She has never used smokeless tobacco. She reports that she does not drink alcohol or use drugs.  Allergies  Allergen Reactions  . Lactose Intolerance (Gi) Nausea And Vomiting    severe stomach pain  . Penicillins Hives and  Itching    "haven't used any since 1970's" "haven't used any since 1970's"  . Sulfa Antibiotics Hives and Itching    "haven't used any since 1970's"  . Ciprofloxacin Swelling  . Sulfasalazine Hives and Itching    "haven't used any since 1970's"    Family History  Problem Relation Age of Onset  . Stroke Father 61  . Stroke Mother 8  . Anesthesia problems Neg Hx   . Hypotension Neg Hx   . Malignant hyperthermia Neg Hx   . Pseudochol deficiency Neg Hx   . Stomach cancer Neg Hx   . Esophageal cancer Neg Hx     Prior to Admission medications   Medication Sig Start Date End Date Taking? Authorizing Provider  dicyclomine (BENTYL) 20 MG tablet TAKE 1/2 TABLET BY MOUTH EVERY 6 HOURS AS NEEDED FOR SPASMS(ABDOMINAL PAIN) 09/16/16  Yes Gatha Mayer, MD  diltiazem (DILT-XR) 120 MG 24 hr capsule TAKE 1 CAPSULE(120 MG) BY MOUTH DAILY 01/04/18  Yes Minus Breeding, MD  famotidine (PEPCID) 20 MG tablet Take 20 mg by mouth 2 (two) times daily.   Yes [provider]  gabapentin (NEURONTIN) 300 MG capsule Take 300 mg by mouth 3 (three) times daily. 12/24/17  Yes [provider]  glimepiride (AMARYL) 4 MG tablet Take 4 mg by mouth daily with breakfast.    Yes [provider]  HYDROcodone-acetaminophen (NORCO/VICODIN) 5-325 MG tablet Take 1 tablet by mouth every 6 (six) hours as needed. 11/01/17  Yes Alma Friendly, MD  LANTUS SOLOSTAR 100 UNIT/ML Solostar Pen Inject 22 Units into the skin daily.  09/01/13  Yes [provider]  latanoprost (XALATAN) 0.005 % ophthalmic solution Place 1 drop into both eyes at bedtime.   Yes [provider]  loperamide (LOPERAMIDE A-D) 2 MG tablet Take 1 tablet (2 mg total) by mouth as needed for diarrhea or loose stools (use at bedtime). Patient taking differently: Take 2 mg by mouth at bedtime.  11/27/14  Yes Gatha Mayer, MD  LORazepam (ATIVAN) 1 MG tablet Take 1 tablet (1 mg total) by mouth 3 (three) times daily.  11/01/17  Yes Alma Friendly, MD  methylphenidate (RITALIN) 5 MG tablet Take 1 tablet (5 mg total) by mouth See admin instructions. take 5mg  in the morning daily and then 5mg  twice a day on every other day 11/01/17  Yes Ezenduka, Adline Peals, MD  metoprolol tartrate (LOPRESSOR) 25 MG tablet TAKE 1 TABLET(25 MG) BY MOUTH TWICE DAILY WITH FOOD 12/13/17  Yes Minus Breeding, MD  mirtazapine (REMERON) 7.5 MG tablet TAKE 1 TABLET(7.5 MG) BY MOUTH AT BEDTIME 10/06/16  Yes Gatha Mayer, MD  Multiple Vitamin (MULTIVITAMIN WITH MINERALS) TABS Take 1 tablet by mouth daily.   Yes [provider]  omeprazole (PRILOSEC) 20 MG capsule Take 20 mg by mouth 2 (two) times daily.    Yes [provider]  oxybutynin (DITROPAN) 5 MG tablet Take 1 tablet by mouth  at bedtime. 07/29/16  Yes [provider]  promethazine (PHENERGAN) 25 MG tablet Take 25 mg by mouth 3 (three) times daily as needed for nausea or vomiting.   Yes [provider]  sucralfate (CARAFATE) 1 g tablet Take 1 g by mouth 2 (two) times daily.   Yes [provider]  SYMBICORT 160-4.5 MCG/ACT inhaler Inhale 2 puffs into the lungs 2 (two) times daily.  03/08/14  Yes [provider]  timolol (TIMOPTIC) 0.5 % ophthalmic solution  06/02/15  Yes [provider]  warfarin (COUMADIN) 5 MG tablet Take 2.5-5 mg by mouth daily. Take 1.25mg  every Sun/Wed then 2.5 mg the rest of the week   Yes [provider]    Physical Exam: Vitals:   01/06/18 1337 01/06/18 1339 01/06/18 1515 01/06/18 1721  BP: (!) 149/64  (!) 138/96 (!) 156/85  Pulse: 86  85 (!) 113  Resp: 18  16 20   Temp: 97.7 F (36.5 C)     TempSrc: Oral     SpO2: (!) 86% (!) 88% 93% 93%  Weight:      Height:        Constitutional:  NAD, calm, comfortable, lying in bed with nasal canula in place Eyes: PERRL, lids and conjunctivae normal ENMT:  Dry mucous membranes.  Oropharynx nonerythematous, no exudates.   Neck:  No nuchal  rigidity, no masses Respiratory:  No wheezes, rales, or rhonchi Cardiovascular: IRRR, no murmurs / rubs / gallops.  2+ radial pulses. Abdomen:  Normal active bowel sounds, soft, nondistended, nontender Musculoskeletal: Normal muscle tone and bulk.  No contractures.  Left leg is externally rotated and foreshortened.  Tender to palpation along the left lateral hip Skin:  no rashes, abrasions, or ulcers Neurologic:  CN 2-12 grossly intact. Sensation intact to light touch, strength 5/5 throughout Psychiatric:  Alert and oriented x 3. Normal affect.   Labs on Admission: I have personally reviewed following labs and imaging studies  CBC: Recent Labs  Lab 01/06/18 1541  WBC 19.2*  NEUTROABS 15.9*  HGB 15.5*  HCT 45.5  MCV 91.7  PLT 176   Basic Metabolic Panel: Recent Labs  Lab 01/06/18 1424  NA 138  K 5.3*  CL 101  CO2 26  GLUCOSE 148*  BUN 11  CREATININE 0.82  CALCIUM 9.6   GFR: Estimated Creatinine Clearance: 41.6 mL/min (by C-G formula based on SCr of 0.82 mg/dL). Liver Function Tests: No results for input(s): AST, ALT, ALKPHOS, BILITOT, PROT, ALBUMIN in the last 168 hours. No results for input(s): LIPASE, AMYLASE in the last 168 hours. No results for input(s): AMMONIA in the last 168 hours. Coagulation Profile: Recent Labs  Lab 01/06/18 1541  INR 2.06   Cardiac Enzymes: No results for input(s): CKTOTAL, CKMB, CKMBINDEX, TROPONINI in the last 168 hours. BNP (last 3 results) No results for input(s): PROBNP in the last 8760 hours. HbA1C: No results for input(s): HGBA1C in the last 72 hours. CBG: No results for input(s): GLUCAP in the last 168 hours. Lipid Profile: No results for input(s): CHOL, HDL, LDLCALC, TRIG, CHOLHDL, LDLDIRECT in the last 72 hours. Thyroid Function Tests: No results for input(s): TSH, T4TOTAL, FREET4, T3FREE, THYROIDAB in the last 72 hours. Anemia Panel: No results for input(s): VITAMINB12, FOLATE, FERRITIN, TIBC, IRON, RETICCTPCT in the  last 72 hours. Urine analysis:    Component Value Date/Time   COLORURINE YELLOW 10/28/2017 1806   APPEARANCEUR CLEAR 10/28/2017 1806   LABSPEC 1.029 10/28/2017 1806   PHURINE 7.0 10/28/2017 1806  GLUCOSEU NEGATIVE 10/28/2017 1806   HGBUR NEGATIVE 10/28/2017 1806   BILIRUBINUR NEGATIVE 10/28/2017 1806   KETONESUR 20 (A) 10/28/2017 1806   PROTEINUR 30 (A) 10/28/2017 1806   UROBILINOGEN 0.2 11/23/2013 1431   NITRITE NEGATIVE 10/28/2017 1806   LEUKOCYTESUR NEGATIVE 10/28/2017 1806   Sepsis Labs: @LABRCNTIP (procalcitonin:4,lacticidven:4) )No results found for this or any previous visit (from the past 240 hour(s)).   Radiological Exams on Admission: Ct Head Wo Contrast  Result Date: 01/06/2018 CLINICAL DATA:  Head trauma, patient on anticoagulation EXAM: CT HEAD WITHOUT CONTRAST TECHNIQUE: Contiguous axial images were obtained from the base of the skull through the vertex without intravenous contrast. COMPARISON:  None. FINDINGS: Brain: No evidence of acute infarction, hemorrhage, extra-axial collection, ventriculomegaly, or mass effect. Generalized cerebral atrophy. Periventricular white matter low attenuation likely secondary to microangiopathy. Vascular: Cerebrovascular atherosclerotic calcifications are noted. Skull: Negative for fracture or focal lesion. Sinuses/Orbits: Visualized portions of the orbits are unremarkable. Visualized portions of the paranasal sinuses and mastoid air cells are unremarkable. Other: None. IMPRESSION: No acute intracranial pathology. Electronically Signed   By: Kathreen Devoid   On: 01/06/2018 16:22   Dg Hip Unilat W Or Wo Pelvis 2-3 Views Left  Result Date: 01/06/2018 CLINICAL DATA:  84 y/o  F; severe left hip pain after fall. EXAM: DG HIP (WITH OR WITHOUT PELVIS) 2-3V LEFT COMPARISON:  None. FINDINGS: Acute fracture of the left proximal femur which appears to be a basicervical neck fracture, possibly extending into the greater trochanter. Coxa vara angulation. Hip  joints are maintained. No pelvic fracture or diastasis. IMPRESSION: Acute left proximal femur fracture which appears to be a basicervical neck fracture, possibly extending into the greater trochanter. Coxa vara angulation. Electronically Signed   By: Kristine Garbe M.D.   On: 01/06/2018 15:26    EKG: Independently reviewed.  A-fib, no acute ischemia  Assessment/Plan Principal Problem:   Hip fracture (HCC) Active Problems:   DM II (diabetes mellitus, type II), controlled (HCC)   HTN (hypertension)   Atrial fibrillation    Anxiety   COPD (chronic obstructive pulmonary disease) (HCC)   Acute left proximal femur fracture -Bedrest pending surgery -SCDs for DVT prophylaxis pending surgery - purewick pending surgery -  Reversing INR as below -  NPO at [facility] for possible surgery -  Transfer to [facility] per Orthopedics recommendations -  RCRI, 1 risk factor (diabetes) = 1% Rate of cardiac death, nonfatal myocardial infarction, and nonfatal cardiac arrest according to the number of predictors.  1.3% Rate of myocardial infarction, pulmonary edema, ventricular fibrillation, primary cardiac arrest, and complete heart block  Leukocytosis likely reactive, but she has had fatigue and cough, and her urine appears cloudy -  CXR and UA  -  Trend WBC  Chronic atrial fibrillation on Coumadin, INR 2, heart rate in the low 100s.  CHA2DS2 - VASc score of 4 with a risk of stroke of 4% - hold coumadin - vitamin K 5mg  IV once -  Repeat INR in AM -  Continue diltiazem and metoprolol with hold parameters -  telemetry  Diabetes mellitus type 2, no recent A1c -  A1c  -Hold oral medications -Start low-dose sliding scale insulin -Reduce Lantus to 10 units (normally takes 22 units)  Hypertension, blood pressure is within normal limits  -Continue metoprolol, diltiazem with hold parameters   COPD, stable, continue Symbicort  -Add as needed duo nebs   Hx of C. difficile colitis, monitor for  diarrhea  GERD, stable, continue Prilosec and famotidine  Diabetic neuropathy, not improved with gabapentin which may be contributing to her lightheadedness -  Decrease to 100mg  TID and potentially taper off  Anxiety, stable, continue Ativan prn and Remeron  Bladder spasms, continue oxybutynin  Glaucoma, stable, continue latanoprost and timolol eyedrops  DVT prophylaxis: SCDs Code Status: full code Family Communication:  Patient alone Disposition Plan: likely to SNF a few days after surgery   Consults called: Orthopedic surgery  Admission status: inpatient, tele, to [place] MD [facility] Pager [phone]  If 7PM-7AM, please contact night-coverage [email or web address] Password [facility]  01/06/2018, 6:11 PM

## 2018-01-06 NOTE — ED Notes (Signed)
Phlebotomy called for blood draw. °

## 2018-01-06 NOTE — ED Provider Notes (Signed)
  Face-to-face evaluation   History: She presents for evaluation of left hip pain which occurred when she fell because "I was off balance."  She also hit her head and injured her left arm she fell.  Physical exam: Alert elderly female.  Head nontender to palpation without deformity or crepitation.  Left hip tender laterally.  No significant left leg shortening.  Oral mucous members are dry.  No dysarthria or aphasia.  Medical screening examination/treatment/procedure(s) were conducted as a shared visit with non-physician practitioner(s) and myself.  I personally evaluated the patient during the encounter   Daleen Bo, MD 01/09/18 1747

## 2018-01-06 NOTE — ED Provider Notes (Signed)
[facility] DEPT Provider Note   CSN: [id number] Arrival date & time: 01/06/18  1317     History   Chief Complaint No chief complaint on file.   HPI [name] [name] is a 84 y.o. female.  HPI Patient presents to the emergency department with a fall and left hip pain.  The patient states that happened this morning after she woke up.  She states she normally uses a walker but then sided that she was able to walk a few feet but got caught up in her own feet and fell on the left side.  She states she did hit her head but did not lose consciousness.  Patient states that she has no other pain other than her left hip.  Patient states she did not take any medications prior to arrival.  She states EMS did give her 100 mcg of fentanyl.  The patient denies chest pain, shortness of breath, headache,blurred vision, neck pain, fever, cough, weakness, numbness, dizziness, anorexia, edema, abdominal pain, nausea, vomiting, diarrhea, rash, back pain, dysuria, hematemesis, bloody stool, near syncope, or syncope. Past Medical History:  Diagnosis Date  . Atrial fibrillation (HCC)    RVR hx.  Chronic Coumadin  . C. difficile colitis 09/21/2013, 11/2013  . Carotid stenosis    a. Carotid US (10/2013):  bilat 1-39%; f/u 1 year  . Chronic lower back pain   . COPD (chronic obstructive pulmonary disease) ([place])   . Depression   . GERD (gastroesophageal reflux disease)    with HH  . Glaucoma   . HCAP (healthcare-associated pneumonia) 11/21/2011  . High cholesterol   . Hypertension   . Metabolic encephalopathy 04/11/9380  . Mitral regurgitation 03/11/2012  . Pancreatitis    ~2008, 11/2011 post ERCP  . Pneumonia 12/2011   "first time I know about"  . Sepsis ([place]) 11/27/2013  . SIRS (systemic inflammatory response syndrome) ([place]) 03/13/2012   a/w post ERCP  pancreatitis.   . Type II diabetes mellitus [facility])     Patient Active Problem List   Diagnosis Date Noted  . Advance care  planning   . Goals of care, counseling/discussion   . COPD (chronic obstructive pulmonary disease) ([place]) 10/28/2017  . RLQ abdominal pain 04/24/2014  . Nausea alone 04/11/2014  . Abdominal pain 08/05/2012    Class: Acute  . Irritable bowel syndrome 04/12/2012  . Chronic diarrhea 04/12/2012  . Encounter for long-term (current) use of anticoagulants 03/17/2012  . Depression 03/13/2012  . Anxiety 03/13/2012  . Mitral regurgitation 03/11/2012  . Chronic epigastric pain 01/25/2012  . Atrial fibrillation    . Hypokalemia 11/26/2011  . UTI (urinary tract infection) 11/13/2011  . DM II (diabetes mellitus, type II), controlled ([place]) 11/13/2011  . HTN (hypertension) 11/13/2011  . Hypercholesteremia 11/13/2011  . Glaucoma (increased eye pressure) 11/13/2011    Past Surgical History:  Procedure Laterality Date  . APPENDECTOMY  ~ 1960  . CATARACT EXTRACTION W/ INTRAOCULAR LENS  IMPLANT, BILATERAL  2000's  . CHOLECYSTECTOMY  1990's   Lap chole  . COLONOSCOPY  01/2003   diverticulosis, 45mm hyperplastic sigmoid polyp  . DILATION AND CURETTAGE OF UTERUS    . ERCP  11/13/2011   Procedure: ENDOSCOPIC RETROGRADE CHOLANGIOPANCREATOGRAPHY (ERCP);  Surgeon: Beryle Beams, MD;  Location: [facility] ENDOSCOPY;  Service: Endoscopy;  Laterality: N/A;  . FLEXIBLE SIGMOIDOSCOPY N/A 05/02/2014   Procedure: FLEXIBLE SIGMOIDOSCOPY;  Surgeon: Gatha Mayer, MD;  Location: [facility] ENDOSCOPY;  Service: Endoscopy;  Laterality: N/A;  . TONSILLECTOMY AND  ADENOIDECTOMY  ~ 1939  . VAGINAL HYSTERECTOMY  ~ 1960's     OB History   None      Home Medications    Prior to Admission medications   Medication Sig Start Date End Date Taking? Authorizing Provider  dicyclomine (BENTYL) 20 MG tablet TAKE 1/2 TABLET BY MOUTH EVERY 6 HOURS AS NEEDED FOR SPASMS(ABDOMINAL PAIN) 09/16/16  Yes Gatha Mayer, MD  diltiazem (DILT-XR) 120 MG 24 hr capsule TAKE 1 CAPSULE(120 MG) BY MOUTH DAILY 01/04/18  Yes Minus Breeding, MD  famotidine  (PEPCID) 20 MG tablet Take 20 mg by mouth 2 (two) times daily.   Yes [provider]  gabapentin (NEURONTIN) 300 MG capsule Take 300 mg by mouth 3 (three) times daily. 12/24/17  Yes [provider]  glimepiride (AMARYL) 4 MG tablet Take 4 mg by mouth daily with breakfast.    Yes [provider]  HYDROcodone-acetaminophen (NORCO/VICODIN) 5-325 MG tablet Take 1 tablet by mouth every 6 (six) hours as needed. 11/01/17  Yes Alma Friendly, MD  LANTUS SOLOSTAR 100 UNIT/ML Solostar Pen Inject 22 Units into the skin daily.  09/01/13  Yes [provider]  latanoprost (XALATAN) 0.005 % ophthalmic solution Place 1 drop into both eyes at bedtime.   Yes [provider]  loperamide (LOPERAMIDE A-D) 2 MG tablet Take 1 tablet (2 mg total) by mouth as needed for diarrhea or loose stools (use at bedtime). Patient taking differently: Take 2 mg by mouth at bedtime.  11/27/14  Yes Gatha Mayer, MD  LORazepam (ATIVAN) 1 MG tablet Take 1 tablet (1 mg total) by mouth 3 (three) times daily. 11/01/17  Yes Alma Friendly, MD  methylphenidate (RITALIN) 5 MG tablet Take 1 tablet (5 mg total) by mouth See admin instructions. take 5mg  in the morning daily and then 5mg  twice a day on every other day 11/01/17  Yes Alma Friendly, MD  metoprolol tartrate (LOPRESSOR) 25 MG tablet TAKE 1 TABLET(25 MG) BY MOUTH TWICE DAILY WITH FOOD 12/13/17  Yes Minus Breeding, MD  mirtazapine (REMERON) 7.5 MG tablet TAKE 1 TABLET(7.5 MG) BY MOUTH AT BEDTIME 10/06/16  Yes Gatha Mayer, MD  Multiple Vitamin (MULTIVITAMIN WITH MINERALS) TABS Take 1 tablet by mouth daily.   Yes [provider]  omeprazole (PRILOSEC) 20 MG capsule Take 20 mg by mouth 2 (two) times daily.    Yes [provider]  oxybutynin (DITROPAN) 5 MG tablet Take 1 tablet by mouth at bedtime. 07/29/16  Yes [provider]  promethazine (PHENERGAN) 25 MG tablet Take 25 mg by mouth 3 (three) times  daily as needed for nausea or vomiting.   Yes [provider]  sucralfate (CARAFATE) 1 g tablet Take 1 g by mouth 2 (two) times daily.   Yes [provider]  SYMBICORT 160-4.5 MCG/ACT inhaler Inhale 2 puffs into the lungs 2 (two) times daily.  03/08/14  Yes [provider]  timolol (TIMOPTIC) 0.5 % ophthalmic solution  06/02/15  Yes [provider]  warfarin (COUMADIN) 5 MG tablet Take 2.5-5 mg by mouth daily. Take 1.25mg  every Sun/Wed then 2.5 mg the rest of the week   Yes [provider]    Family History Family History  Problem Relation Age of Onset  . Stroke Father 74  . Stroke Mother 22  . Anesthesia problems Neg Hx   . Hypotension Neg Hx   . Malignant hyperthermia Neg Hx   . Pseudochol deficiency Neg Hx   .  Stomach cancer Neg Hx   . Esophageal cancer Neg Hx     Social History Social History   Tobacco Use  . Smoking status: Never Smoker  . Smokeless tobacco: Never Used  Substance Use Topics  . Alcohol use: No  . Drug use: No     Allergies   Lactose intolerance (gi); Penicillins; Sulfa antibiotics; Ciprofloxacin; and Sulfasalazine   Review of Systems Review of Systems All other systems negative except as documented in the HPI. All pertinent positives and negatives as reviewed in the HPI.  Physical Exam Updated Vital Signs BP (!) 138/96   Pulse 85   Temp 97.7 F (36.5 C) (Oral)   Resp 16   Ht 5' (1.524 m)   Wt 63 kg (139 lb)   SpO2 93%   BMI 27.15 kg/m   Physical Exam  Constitutional: She is oriented to person, place, and time. She appears well-developed and well-nourished. No distress.  HENT:  Head: Normocephalic and atraumatic.  Mouth/Throat: Oropharynx is clear and moist.  Eyes: Pupils are equal, round, and reactive to light.  Neck: Normal range of motion. Neck supple.  Cardiovascular: Normal rate, regular rhythm and normal heart sounds. Exam reveals no gallop and no friction rub.  No murmur  heard. Pulmonary/Chest: Effort normal and breath sounds normal. No respiratory distress. She has no wheezes.  Abdominal: Soft. Bowel sounds are normal. She exhibits no distension. There is no tenderness.  Neurological: She is alert and oriented to person, place, and time. She exhibits normal muscle tone. Coordination normal.  Skin: Skin is warm and dry. Capillary refill takes less than 2 seconds. No rash noted. No erythema.  Psychiatric: She has a normal mood and affect. Her behavior is normal.  Nursing note and vitals reviewed.    ED Treatments / Results  Labs (all labs ordered are listed, but only abnormal results are displayed) Labs Reviewed  BASIC METABOLIC PANEL - Abnormal; Notable for the following components:      Result Value   Potassium 5.3 (*)    Glucose, Bld 148 (*)    All other components within normal limits  CBC WITH DIFFERENTIAL/PLATELET  PROTIME-INR    EKG None  Radiology Dg Hip Unilat W Or Wo Pelvis 2-3 Views Left  Result Date: 01/06/2018 CLINICAL DATA:  85 y/o  F; severe left hip pain after fall. EXAM: DG HIP (WITH OR WITHOUT PELVIS) 2-3V LEFT COMPARISON:  None. FINDINGS: Acute fracture of the left proximal femur which appears to be a basicervical neck fracture, possibly extending into the greater trochanter. Coxa vara angulation. Hip joints are maintained. No pelvic fracture or diastasis. IMPRESSION: Acute left proximal femur fracture which appears to be a basicervical neck fracture, possibly extending into the greater trochanter. Coxa vara angulation. Electronically Signed   By: Kristine Garbe M.D.   On: 01/06/2018 15:26    Procedures Procedures (including critical care time)  Medications Ordered in ED Medications  morphine 4 MG/ML injection 4 mg (4 mg Intravenous Given 01/06/18 1549)     Initial Impression / Assessment and Plan / ED Course  I have reviewed the triage vital signs and the nursing notes.  Pertinent labs & imaging results that were  available during my care of the patient were reviewed by me and considered in my medical decision making (see chart for details).     The patient has a left anterior throat fracture and I spoke with Dr. Griffin Basil who would like the patient admitted to come in by the hospitalist  and they will operate on her tomorrow.  I advised the patient and the family the plan and all questions were answered.  Final Clinical Impressions(s) / ED Diagnoses   Final diagnoses:  None    ED Discharge Orders    None       Dalia Heading, PA-C 01/06/18 1620    Daleen Bo, MD 01/09/18 1747

## 2018-01-06 NOTE — ED Notes (Signed)
Was only able to collect CMP. RN was notified. Failed attempt for other labs.

## 2018-01-06 NOTE — ED Notes (Signed)
Bed: WA20 Expected date:  Expected time:  Means of arrival:  Comments: EMS/fall/hip pain 

## 2018-01-07 ENCOUNTER — Encounter (HOSPITAL_COMMUNITY): Payer: Self-pay | Admitting: Surgery

## 2018-01-07 ENCOUNTER — Ambulatory Visit: Payer: Medicare HMO | Admitting: Internal Medicine

## 2018-01-07 ENCOUNTER — Other Ambulatory Visit: Payer: Self-pay

## 2018-01-07 ENCOUNTER — Inpatient Hospital Stay (HOSPITAL_COMMUNITY): Payer: Medicare HMO | Admitting: Anesthesiology

## 2018-01-07 ENCOUNTER — Inpatient Hospital Stay (HOSPITAL_COMMUNITY): Payer: Medicare HMO

## 2018-01-07 ENCOUNTER — Encounter (HOSPITAL_COMMUNITY)
Admission: EM | Disposition: A | Discharge status: Skilled Nursing Facility | Payer: Self-pay | Source: Home / Self Care | Attending: Internal Medicine

## 2018-01-07 DIAGNOSIS — E1140 Type 2 diabetes mellitus with diabetic neuropathy, unspecified: Secondary | ICD-10-CM

## 2018-01-07 HISTORY — PX: HIP ARTHROPLASTY: SHX981

## 2018-01-07 LAB — CBC
HEMATOCRIT: 43.6 % (ref 36.0–46.0)
Hemoglobin: 14.6 g/dL (ref 12.0–15.0)
MCH: 30.5 pg (ref 26.0–34.0)
MCHC: 33.5 g/dL (ref 30.0–36.0)
MCV: 91 fL (ref 78.0–100.0)
PLATELETS: 232 10*3/uL (ref 150–400)
RBC: 4.79 MIL/uL (ref 3.87–5.11)
RDW: 13.4 % (ref 11.5–15.5)
WBC: 13.3 10*3/uL — AB (ref 4.0–10.5)

## 2018-01-07 LAB — PROTIME-INR
INR: 1.27
Prothrombin Time: 15.8 seconds — ABNORMAL HIGH (ref 11.4–15.2)

## 2018-01-07 LAB — HEMOGLOBIN A1C
Hgb A1c MFr Bld: 7.1 % — ABNORMAL HIGH (ref 4.8–5.6)
Mean Plasma Glucose: 157.07 mg/dL

## 2018-01-07 LAB — SURGICAL PCR SCREEN
MRSA, PCR: NEGATIVE
STAPHYLOCOCCUS AUREUS: NEGATIVE

## 2018-01-07 LAB — BASIC METABOLIC PANEL
ANION GAP: 14 (ref 5–15)
BUN: 9 mg/dL (ref 6–20)
CO2: 22 mmol/L (ref 22–32)
Calcium: 8.9 mg/dL (ref 8.9–10.3)
Chloride: 100 mmol/L — ABNORMAL LOW (ref 101–111)
Creatinine, Ser: 0.85 mg/dL (ref 0.44–1.00)
GFR calc Af Amer: >60 mL/min (ref >60)
GLUCOSE: 195 mg/dL — AB (ref 65–99)
POTASSIUM: 3.7 mmol/L (ref 3.5–5.1)
Sodium: 136 mmol/L (ref 135–145)

## 2018-01-07 LAB — GLUCOSE, CAPILLARY
GLUCOSE-CAPILLARY: 166 mg/dL — AB (ref 65–99)
GLUCOSE-CAPILLARY: 172 mg/dL — AB (ref 65–99)
GLUCOSE-CAPILLARY: 202 mg/dL — AB (ref 65–99)
GLUCOSE-CAPILLARY: 226 mg/dL — AB (ref 65–99)
Glucose-Capillary: 187 mg/dL — ABNORMAL HIGH (ref 65–99)

## 2018-01-07 SURGERY — HEMIARTHROPLASTY, HIP, DIRECT ANTERIOR APPROACH, FOR FRACTURE
Anesthesia: General | Laterality: Left

## 2018-01-07 MED ORDER — INSULIN ASPART 100 UNIT/ML ~~LOC~~ SOLN
0.0000 [IU] | Freq: Four times a day (QID) | SUBCUTANEOUS | Status: DC
  Administered 2018-01-07: 3 [IU] via SUBCUTANEOUS
  Administered 2018-01-08: 5 [IU] via SUBCUTANEOUS
  Administered 2018-01-08 (×3): 3 [IU] via SUBCUTANEOUS
  Administered 2018-01-09: 2 [IU] via SUBCUTANEOUS
  Administered 2018-01-09: 3 [IU] via SUBCUTANEOUS
  Administered 2018-01-09 – 2018-01-10 (×2): 2 [IU] via SUBCUTANEOUS
  Administered 2018-01-10: 1 [IU] via SUBCUTANEOUS
  Administered 2018-01-10: 3 [IU] via SUBCUTANEOUS
  Administered 2018-01-10: 2 [IU] via SUBCUTANEOUS
  Administered 2018-01-11: 1 [IU] via SUBCUTANEOUS

## 2018-01-07 MED ORDER — OXYCODONE HCL 5 MG PO TABS: 5.0000 mg | ORAL_TABLET | Freq: Once | ORAL | Status: DC | PRN

## 2018-01-07 MED ORDER — ACETAMINOPHEN 325 MG PO TABS: 650.0000 mg | ORAL_TABLET | Freq: Four times a day (QID) | ORAL | Status: DC | PRN

## 2018-01-07 MED ORDER — FENTANYL CITRATE (PF) 250 MCG/5ML IJ SOLN
INTRAMUSCULAR | Status: AC
  Filled 2018-01-07: qty 5

## 2018-01-07 MED ORDER — EPHEDRINE 5 MG/ML INJ
INTRAVENOUS | Status: AC
  Filled 2018-01-07: qty 10

## 2018-01-07 MED ORDER — METOCLOPRAMIDE HCL 5 MG/ML IJ SOLN: 5.0000 mg | Freq: Three times a day (TID) | INTRAMUSCULAR | Status: DC | PRN

## 2018-01-07 MED ORDER — METOCLOPRAMIDE HCL 5 MG PO TABS: 5.0000 mg | ORAL_TABLET | Freq: Three times a day (TID) | ORAL | Status: DC | PRN

## 2018-01-07 MED ORDER — VANCOMYCIN HCL 1000 MG IV SOLR
INTRAVENOUS | Status: AC
  Filled 2018-01-07: qty 1000

## 2018-01-07 MED ORDER — FENTANYL CITRATE (PF) 100 MCG/2ML IJ SOLN
INTRAMUSCULAR | Status: DC | PRN
  Administered 2018-01-07 (×3): 50 ug via INTRAVENOUS

## 2018-01-07 MED ORDER — ONDANSETRON HCL 4 MG/2ML IJ SOLN
INTRAMUSCULAR | Status: AC
  Filled 2018-01-07: qty 2

## 2018-01-07 MED ORDER — SUCCINYLCHOLINE CHLORIDE 20 MG/ML IJ SOLN
INTRAMUSCULAR | Status: DC | PRN
  Administered 2018-01-07: 100 mg via INTRAVENOUS

## 2018-01-07 MED ORDER — ONDANSETRON HCL 4 MG/2ML IJ SOLN: 4.0000 mg | Freq: Once | INTRAMUSCULAR | Status: DC | PRN

## 2018-01-07 MED ORDER — DEXAMETHASONE SODIUM PHOSPHATE 10 MG/ML IJ SOLN
INTRAMUSCULAR | Status: AC
Start: 2018-01-07 — End: 2018-01-07
  Filled 2018-01-07: qty 1

## 2018-01-07 MED ORDER — ONDANSETRON HCL 4 MG PO TABS: 4.0000 mg | ORAL_TABLET | Freq: Four times a day (QID) | ORAL | Status: DC | PRN

## 2018-01-07 MED ORDER — PROPOFOL 10 MG/ML IV BOLUS
INTRAVENOUS | Status: AC
  Filled 2018-01-07: qty 20

## 2018-01-07 MED ORDER — MENTHOL 3 MG MT LOZG: 1.0000 | LOZENGE | OROMUCOSAL | Status: DC | PRN

## 2018-01-07 MED ORDER — PHENYLEPHRINE HCL 10 MG/ML IJ SOLN
INTRAMUSCULAR | Status: DC | PRN
  Administered 2018-01-07 (×6): 80 ug via INTRAVENOUS

## 2018-01-07 MED ORDER — ACETAMINOPHEN 500 MG PO TABS
1000.0000 mg | ORAL_TABLET | Freq: Four times a day (QID) | ORAL | Status: AC
  Administered 2018-01-07 – 2018-01-08 (×3): 1000 mg via ORAL
  Filled 2018-01-07 (×3): qty 2

## 2018-01-07 MED ORDER — ROCURONIUM BROMIDE 100 MG/10ML IV SOLN
INTRAVENOUS | Status: DC | PRN
  Administered 2018-01-07: 30 mg via INTRAVENOUS

## 2018-01-07 MED ORDER — CEFAZOLIN SODIUM 1 G IJ SOLR
INTRAMUSCULAR | Status: AC
  Filled 2018-01-07: qty 20

## 2018-01-07 MED ORDER — PHENYLEPHRINE 40 MCG/ML (10ML) SYRINGE FOR IV PUSH (FOR BLOOD PRESSURE SUPPORT)
PREFILLED_SYRINGE | INTRAVENOUS | Status: AC
  Filled 2018-01-07: qty 20

## 2018-01-07 MED ORDER — PHENYLEPHRINE 40 MCG/ML (10ML) SYRINGE FOR IV PUSH (FOR BLOOD PRESSURE SUPPORT)
PREFILLED_SYRINGE | INTRAVENOUS | Status: AC
  Filled 2018-01-07: qty 10

## 2018-01-07 MED ORDER — 0.9 % SODIUM CHLORIDE (POUR BTL) OPTIME
TOPICAL | Status: DC | PRN
  Administered 2018-01-07: 1000 mL

## 2018-01-07 MED ORDER — CEFAZOLIN SODIUM-DEXTROSE 2-3 GM-%(50ML) IV SOLR
2.0000 g | Freq: Once | INTRAVENOUS | Status: AC
  Administered 2018-01-07: 2 g via INTRAVENOUS

## 2018-01-07 MED ORDER — FENTANYL CITRATE (PF) 100 MCG/2ML IJ SOLN
INTRAMUSCULAR | Status: AC
  Administered 2018-01-07: 50 ug via INTRAVENOUS
  Filled 2018-01-07: qty 2

## 2018-01-07 MED ORDER — LIDOCAINE HCL (CARDIAC) 20 MG/ML IV SOLN
INTRAVENOUS | Status: DC | PRN
  Administered 2018-01-07: 80 mg via INTRAVENOUS

## 2018-01-07 MED ORDER — FENTANYL CITRATE (PF) 100 MCG/2ML IJ SOLN
25.0000 ug | INTRAMUSCULAR | Status: DC | PRN
  Administered 2018-01-07 (×2): 50 ug via INTRAVENOUS

## 2018-01-07 MED ORDER — DEXAMETHASONE SODIUM PHOSPHATE 10 MG/ML IJ SOLN
INTRAMUSCULAR | Status: DC | PRN
  Administered 2018-01-07: 10 mg via INTRAVENOUS

## 2018-01-07 MED ORDER — LIDOCAINE HCL (CARDIAC) 20 MG/ML IV SOLN
INTRAVENOUS | Status: AC
  Filled 2018-01-07: qty 5

## 2018-01-07 MED ORDER — ALBUMIN HUMAN 5 % IV SOLN
INTRAVENOUS | Status: DC | PRN
  Administered 2018-01-07: 17:00:00 via INTRAVENOUS

## 2018-01-07 MED ORDER — OXYCODONE HCL 5 MG/5ML PO SOLN: 5.0000 mg | Freq: Once | ORAL | Status: DC | PRN

## 2018-01-07 MED ORDER — SUGAMMADEX SODIUM 200 MG/2ML IV SOLN
INTRAVENOUS | Status: AC
  Filled 2018-01-07: qty 2

## 2018-01-07 MED ORDER — SUGAMMADEX SODIUM 200 MG/2ML IV SOLN
INTRAVENOUS | Status: DC | PRN
  Administered 2018-01-07: 150 mg via INTRAVENOUS

## 2018-01-07 MED ORDER — VANCOMYCIN HCL 1 G IV SOLR
INTRAVENOUS | Status: DC | PRN
  Administered 2018-01-07: 1000 mg

## 2018-01-07 MED ORDER — LACTATED RINGERS IV SOLN
INTRAVENOUS | Status: DC | PRN
  Administered 2018-01-07: 14:00:00 via INTRAVENOUS

## 2018-01-07 MED ORDER — PROPOFOL 10 MG/ML IV BOLUS
INTRAVENOUS | Status: DC | PRN
  Administered 2018-01-07: 100 mg via INTRAVENOUS
  Administered 2018-01-07: 20 mg via INTRAVENOUS

## 2018-01-07 MED ORDER — PHENOL 1.4 % MT LIQD: 1.0000 | OROMUCOSAL | Status: DC | PRN

## 2018-01-07 MED ORDER — ROCURONIUM BROMIDE 10 MG/ML (PF) SYRINGE
PREFILLED_SYRINGE | INTRAVENOUS | Status: AC
  Filled 2018-01-07: qty 5

## 2018-01-07 MED ORDER — VANCOMYCIN HCL 500 MG IV SOLR
INTRAVENOUS | Status: AC
  Filled 2018-01-07: qty 500

## 2018-01-07 MED ORDER — ONDANSETRON HCL 4 MG/2ML IJ SOLN
4.0000 mg | Freq: Four times a day (QID) | INTRAMUSCULAR | Status: DC | PRN
  Administered 2018-01-09: 4 mg via INTRAVENOUS
  Filled 2018-01-07: qty 2

## 2018-01-07 MED ORDER — ENOXAPARIN SODIUM 40 MG/0.4ML ~~LOC~~ SOLN
40.0000 mg | SUBCUTANEOUS | Status: DC
  Administered 2018-01-08: 40 mg via SUBCUTANEOUS
  Filled 2018-01-07: qty 0.4

## 2018-01-07 MED ORDER — CEFAZOLIN SODIUM-DEXTROSE 2-4 GM/100ML-% IV SOLN
2.0000 g | Freq: Four times a day (QID) | INTRAVENOUS | Status: AC
  Administered 2018-01-07 – 2018-01-08 (×2): 2 g via INTRAVENOUS
  Filled 2018-01-07 (×2): qty 100

## 2018-01-07 MED ORDER — ONDANSETRON HCL 4 MG/2ML IJ SOLN
INTRAMUSCULAR | Status: DC | PRN
  Administered 2018-01-07: 4 mg via INTRAVENOUS

## 2018-01-07 MED ORDER — OXYCODONE HCL 5 MG PO TABS: 5.0000 mg | ORAL_TABLET | ORAL | Status: DC | PRN

## 2018-01-07 MED ORDER — DOCUSATE SODIUM 100 MG PO CAPS
100.0000 mg | ORAL_CAPSULE | Freq: Two times a day (BID) | ORAL | Status: DC
  Administered 2018-01-07 – 2018-01-08 (×3): 100 mg via ORAL
  Filled 2018-01-07 (×6): qty 1

## 2018-01-07 SURGICAL SUPPLY — 51 items
BLADE SAGITTAL 25.0X1.27X90 (BLADE) ×2 IMPLANT
BLADE SAGITTAL 25.0X1.27X90MM (BLADE) ×1
CHLORAPREP W/TINT 26ML (MISCELLANEOUS) ×6 IMPLANT
CLOSURE STERI-STRIP 1/2X4 (GAUZE/BANDAGES/DRESSINGS) ×1
CLSR STERI-STRIP ANTIMIC 1/2X4 (GAUZE/BANDAGES/DRESSINGS) ×2 IMPLANT
COVER SURGICAL LIGHT HANDLE (MISCELLANEOUS) ×3 IMPLANT
DRAPE INCISE IOBAN 66X45 STRL (DRAPES) ×3 IMPLANT
DRAPE ORTHO SPLIT 77X108 STRL (DRAPES) ×6
DRAPE SURG ORHT 6 SPLT 77X108 (DRAPES) ×2 IMPLANT
DRAPE U-SHAPE 47X51 STRL (DRAPES) ×3 IMPLANT
DRSG AQUACEL AG ADV 3.5X10 (GAUZE/BANDAGES/DRESSINGS) ×2 IMPLANT
ELECT BLADE 4.0 EZ CLEAN MEGAD (MISCELLANEOUS) ×3
ELECT CAUTERY BLADE 6.4 (BLADE) ×3 IMPLANT
ELECT REM PT RETURN 9FT ADLT (ELECTROSURGICAL) ×3
ELECTRODE BLDE 4.0 EZ CLN MEGD (MISCELLANEOUS) IMPLANT
ELECTRODE REM PT RTRN 9FT ADLT (ELECTROSURGICAL) ×1 IMPLANT
GLOVE BIO SURGEON STRL SZ8 (GLOVE) ×6 IMPLANT
GLOVE BIOGEL PI IND STRL 8 (GLOVE) ×1 IMPLANT
GLOVE BIOGEL PI INDICATOR 8 (GLOVE) ×2
GLOVE BIOGEL PI ORTHO PRO SZ8 (GLOVE) ×2
GLOVE PI ORTHO PRO STRL SZ8 (GLOVE) IMPLANT
GOWN STRL REUS W/ TWL LRG LVL3 (GOWN DISPOSABLE) ×2 IMPLANT
GOWN STRL REUS W/ TWL XL LVL3 (GOWN DISPOSABLE) ×2 IMPLANT
GOWN STRL REUS W/TWL 2XL LVL3 (GOWN DISPOSABLE) ×2 IMPLANT
GOWN STRL REUS W/TWL LRG LVL3 (GOWN DISPOSABLE) ×6
GOWN STRL REUS W/TWL XL LVL3 (GOWN DISPOSABLE) ×6
HEAD MODULAR ENDO (Orthopedic Implant) ×3 IMPLANT
HEAD UNPLR 40XMDLR STRL HIP (Orthopedic Implant) IMPLANT
KIT BASIN OR (CUSTOM PROCEDURE TRAY) ×3 IMPLANT
KIT TURNOVER KIT B (KITS) ×3 IMPLANT
MANIFOLD NEPTUNE II (INSTRUMENTS) ×3 IMPLANT
NDL MAYO TROCAR (NEEDLE) ×1 IMPLANT
NEEDLE MAYO TROCAR (NEEDLE) ×3 IMPLANT
PACK TOTAL JOINT (CUSTOM PROCEDURE TRAY) ×3 IMPLANT
PAD ARMBOARD 7.5X6 YLW CONV (MISCELLANEOUS) ×6 IMPLANT
PILLOW ABDUCTION HIP (SOFTGOODS) ×3 IMPLANT
RETRIEVER SUT HEWSON (MISCELLANEOUS) ×3 IMPLANT
SLEEVE UNITRAX V40 STD (Orthopedic Implant) ×2 IMPLANT
STAPLER VISISTAT 35W (STAPLE) ×2 IMPLANT
STEM ACCOLADE SZ 1 34X96X27 (Stem) ×2 IMPLANT
SUT ETHIBOND 2 V 37 (SUTURE) IMPLANT
SUT ETHIBOND 5 LR DA (SUTURE) IMPLANT
SUT FIBERWIRE #2 38 T-5 BLUE (SUTURE) ×12
SUT MON AB 3-0 SH 27 (SUTURE) ×3
SUT MON AB 3-0 SH27 (SUTURE) IMPLANT
SUT VIC AB 0 CT1 27 (SUTURE) ×9
SUT VIC AB 0 CT1 27XBRD ANBCTR (SUTURE) ×2 IMPLANT
SUT VIC AB 2-0 CT1 27 (SUTURE) ×6
SUT VIC AB 2-0 CT1 TAPERPNT 27 (SUTURE) ×2 IMPLANT
SUTURE FIBERWR #2 38 T-5 BLUE (SUTURE) IMPLANT
TRAY FOLEY W/BAG SLVR 14FR (SET/KITS/TRAYS/PACK) ×1 IMPLANT

## 2018-01-07 NOTE — Progress Notes (Signed)
Initial Nutrition Assessment  DOCUMENTATION CODES:   Not applicable  INTERVENTION:  Recommend EE once NPO lifted Ensure Enlive po BID, each supplement provides 350 kcal and 20 grams of protein   NUTRITION DIAGNOSIS:   Increased nutrient needs related to hip fracture as evidenced by estimated needs.  GOAL:   Patient will meet greater than or equal to 90% of their needs  MONITOR:   Diet advancement, Weight trends, I & O's, Labs  REASON FOR ASSESSMENT:   Consult Assessment of nutrition requirement/status  ASSESSMENT:   84 y.o. F admitted for L hip fracture. PMH of chronic a.fib, T2DM, hypertension, hyperlipidemia, COPD, and infections (C.dif, sepsis, recurrent pneumonia). PSH of cholecystectomy.   Dietetic intern unable to obtain nutrition history:   Initially upon visit pt was under the impression that dietetic intern visit was for diabetes diet education; informed pt that nutritional management was consulted per hip fracture protocol to promote post-op healing and recovery. Pt requested revisit after surgery. Pt in severe pain. Plans for surgery later today. Pt complained that she has been moved around a lot today. Asked pt if she would be open to taking a supplement and asked what flavors she liked for after the surgery. Husband was appreciative and understood importance of optimizing nutrition post-op.     Medications reviewed: pepcid, neurontin, novolog 0-9 units, lantus 10 units, lopressor, remeron, MVI with minerals, protonix, morphine, and promethazine.   Labs reviewed: 01/06/18: BG 148 (H), K+ 5.3 (H), WBC 19.2 (H), and hemoglobin 15.5 (H).    Intake/Output Summary (Last 24 hours) at 01/07/2018 1257 Last data filed at 01/07/2018 1100 Gross per 24 hour  Intake 250 ml  Output 1050 ml  Net -800 ml   Lab Results  Component Value Date   HGBA1C 7.1 (H) 01/07/2018   NUTRITION - FOCUSED PHYSICAL EXAM:  Unable to complete. Pt in severe pain.   Diet Order:  Diet NPO  time specified  EDUCATION NEEDS:   Education needs have been addressed(Discussed with pt the importance of optimizing nutrition for hip fractures.)  Skin:  Skin Assessment: Skin Integrity Issues: Skin Integrity Issues:: Other (Comment) Other: skin tear L arm, ecchymosis L back  Last BM:  01/07/18  Height:   Ht Readings from Last 1 Encounters:  01/06/18 5' (1.524 m)    Weight:   Wt Readings from Last 1 Encounters:  01/06/18 139 lb (63 kg)   UBW: 145 lbs % UBW: 96%  Ideal Body Weight:  45.45 kg  BMI:  Body mass index is 27.15 kg/m.  Estimated Nutritional Needs:   Kcal:  1300-1500 kcal  Protein:  65-80 grams  Fluid:  > 1.3 L    Hope Budds, Dietetic Intern

## 2018-01-07 NOTE — Progress Notes (Signed)
PROGRESS NOTE  [name] [name] POE:[id number] DOB: 05/26/1930 DOA: 01/06/2018 PCP: [name], MD  Brief Narrative: 84 year old woman PMH atrial fibrillation, diabetes, COPD, presented with hip pain status post mechanical fall.  Suddenly lost balance, hit head and left hip with immediate pain in left hip.  Admitted for treatment of proximal femur fracture  Assessment/Plan Acute proximal left femur fracture --Bedrest, pain control, SCDs for DVT prophylaxis --Given vitamin K yesterday to reverse INR.  INR within acceptable limits. --No previous history of cardiac disease other than atrial fibrillation which is stable  Hyperkalemia --Resolved.  Chronic atrial fibrillation on warfarin. CHA2DS2-VASc 4.  EKG atrial fibrillation on admission, no acute changes. --Continue to hold warfarin.  INR within acceptable range. --Continue diltiazem and metoprolol --Given atrial fibrillation is stable, discontinue telemetry  Diabetes mellitus type 2 with diabetic neuropathy.  Apparently not improved with gabapentin, consider tapering off as an outpatient. --CBG stable --Continue to hold oral medications, continue sliding scale insulin, continue Lantus at reduced dose until taking diet  COPD --Appears stable.  Continue Symbicort.  Bronchodilators as needed.  Aortic atherosclerosis --    Await labs   DVT prophylaxis: SCDs Code Status: Full Family Communication: none Disposition Plan: pending PT eval    Murray Hodgkins, MD  [facility] Direct contact: [phone] --[address]ia [address]  --[email or web address]; password [email or web address]  7PM-7AM contact night coverage as above 01/07/2018, 5:50 PM  LOS: 1 day   Consultants:  Orthopedics  Procedures:    Antimicrobials:    Interval history/Subjective: Has significant left leg pain.  Chronic low back pain at baseline.  Breathing at baseline.  (Short of breath at baseline).  No history of coronary disease.  No chest  pain.  Objective: Vitals:  Vitals:   01/07/18 0014 01/07/18 0409  BP: (!) 144/88 (!) 155/89  Pulse: (!) 102 (!) 101  Resp: (!) 22 20  Temp: 98.4 F (36.9 C) 97.8 F (36.6 C)  SpO2: 94% 95%    Exam:  Constitutional:  . Appears calm, uncomfortable, nontoxic Eyes:  . pupils and irises appear normal . Normal lids  ENMT:  . grossly normal hearing  . Lips appear normal Respiratory:  . CTA bilaterally, no w/r/r.  . Respiratory effort mildly increased.  Speaks in full sentences. Cardiovascular:  . Irregular, no m/r/g Telemetry atrial fibrillation 90s, 100s . No LE extremity edema   Abdomen:  . Soft Musculoskeletal:  . Digits/nails BUE: no clubbing, cyanosis, petechiae, infection . RLE, LLE   . Left lower extremity shortened, externally rotated. Skin:  . No rashes, lesions, ulcers Psychiatric:  . Mental status o Mood, affect appropriate . judgement and insight appear normal    I have personally reviewed the following:   Labs:  Blood sugar stable.  Basic metabolic panel unremarkable.  Potassium is normalized.  WBC significantly decreased, 13.3.  Hemoglobin stable 14.6.  Platelets within normal limits.    INR 1.27.  Hemoglobin A1c 7.1.  Imaging studies:  Chest x-ray reviewed  CT head negative  EKG atrial fibrillation  Scheduled Meds: . [MAR Hold] diltiazem  120 mg Oral Daily  . [MAR Hold] famotidine  20 mg Oral BID  . [MAR Hold] gabapentin  100 mg Oral TID  . [MAR Hold] insulin aspart  0-9 Units Subcutaneous Q6H  . [MAR Hold] insulin glargine  10 Units Subcutaneous Q2200  . [MAR Hold] latanoprost  1 drop Both Eyes QHS  . [MAR Hold] methylphenidate  5 mg Oral Daily  . [MAR Hold] metoprolol tartrate  25 mg  Oral BID  . [MAR Hold] mirtazapine  7.5 mg Oral QHS  . [MAR Hold] mometasone-formoterol  2 puff Inhalation BID  . [MAR Hold] multivitamin with minerals  1 tablet Oral Daily  . [MAR Hold] oxybutynin  5 mg Oral QHS  . [MAR Hold] pantoprazole  80 mg  Oral Daily  . [MAR Hold] sucralfate  1 g Oral BID  . [MAR Hold] timolol  1 drop Both Eyes Daily   Continuous Infusions: . sodium chloride 50 mL/hr at 01/07/18 0111    Principal Problem:   Hip fracture (HCC) Active Problems:   DM II (diabetes mellitus, type II), controlled (HCC)   Atrial fibrillation    COPD (chronic obstructive pulmonary disease) (HCC)   DM neuropathy, type II diabetes mellitus ([place])   LOS: 1 day

## 2018-01-07 NOTE — Interval H&P Note (Signed)
Discussed case, risks and benefits with patient again.  All questions answered, no change to history.  Specifically went through consent again with husband who expressed understanding.  Ophelia Charter MD

## 2018-01-07 NOTE — Transfer of Care (Signed)
Immediate Anesthesia Transfer of Care Note  Patient: [name] [name]  Procedure(s) Performed: ARTHROPLASTY BIPOLAR HIP (HEMIARTHROPLASTY) (Left )  Patient Location: PACU  Anesthesia Type:General  Level of Consciousness: awake, oriented and patient cooperative  Airway & Oxygen Therapy: Patient Spontanous Breathing and Patient connected to nasal cannula oxygen  Post-op Assessment: Report given to RN and Post -op Vital signs reviewed and stable  Post vital signs: Reviewed  Last Vitals:  Vitals Value Taken Time  BP 168/88 01/07/2018  5:57 PM  Temp    Pulse 108 01/07/2018  6:05 PM  Resp 18 01/07/2018  6:05 PM  SpO2 95 % 01/07/2018  6:05 PM  Vitals shown include unvalidated device data.  Last Pain:  Vitals:   01/07/18 1755  TempSrc:   PainSc: (P) 0-No pain      Patients Stated Pain Goal: 1 (95/18/84 1660)  Complications: No apparent anesthesia complications

## 2018-01-07 NOTE — Anesthesia Preprocedure Evaluation (Addendum)
Anesthesia Evaluation  Patient identified by MRN, date of birth, ID band Patient awake    Reviewed: Allergy & Precautions, NPO status , Patient's Chart, lab work & pertinent test results, reviewed documented beta blocker date and time   Airway Mallampati: III  TM Distance: >3 FB Neck ROM: Full    Dental  (+) Dental Advisory Given, Teeth Intact   Pulmonary COPD,    Pulmonary exam normal breath sounds clear to auscultation       Cardiovascular hypertension, Pt. on medications and Pt. on home beta blockers + Peripheral Vascular Disease  Normal cardiovascular exam+ dysrhythmias Atrial Fibrillation + Valvular Problems/Murmurs MR  Rhythm:Irregular Rate:Normal  '16 Carotid US - 1-39% b/l ICAS  '15 TTE - EF 55% to 60%. Mild MR. Moderately dilated LA.   Neuro/Psych Anxiety Depression negative neurological ROS     GI/Hepatic Neg liver ROS, GERD  Medicated and Controlled,  Endo/Other  diabetes, Type 2, Insulin Dependent  Renal/GU negative Renal ROS  negative genitourinary   Musculoskeletal Chronic low back pain   Abdominal   Peds  Hematology negative hematology ROS (+)   Anesthesia Other Findings   Reproductive/Obstetrics                           Anesthesia Physical Anesthesia Plan  ASA: III  Anesthesia Plan: General   Post-op Pain Management:    Induction: Intravenous  PONV Risk Score and Plan: 4 or greater and Treatment may vary due to age or medical condition, Ondansetron and Propofol infusion  Airway Management Planned: Oral ETT  Additional Equipment: None  Intra-op Plan:   Post-operative Plan: Extubation in OR  Informed Consent: I have reviewed the patients History and Physical, chart, labs and discussed the procedure including the risks, benefits and alternatives for the proposed anesthesia with the patient or authorized representative who has indicated his/her understanding and  acceptance.   Dental advisory given  Plan Discussed with: CRNA and Anesthesiologist  Anesthesia Plan Comments:         Anesthesia Quick Evaluation

## 2018-01-07 NOTE — Consult Note (Signed)
ORTHOPAEDIC CONSULTATION  REQUESTING PHYSICIAN: Samuella Cota, MD  Chief Complaint: L hip pain  HPI: Gwenivere Hiraldo is a 84 y.o. female with  acute traumatic fall from standing resulting in L hip pain and inability to ambulate.  No LOC, at baseline state of health prior to fall.  Walked with walker at baseline due to falls, lives with husband.  Reports significant pain at site of injury, no other areas of pain reported acutely.    Past Medical History:  Diagnosis Date  . Atrial fibrillation (HCC)    RVR hx.  Chronic Coumadin  . C. difficile colitis 09/21/2013, 11/2013  . Carotid stenosis    a. Carotid US (10/2013):  bilat 1-39%; f/u 1 year  . Chronic lower back pain   . COPD (chronic obstructive pulmonary disease) ([place])   . Depression   . GERD (gastroesophageal reflux disease)    with HH  . Glaucoma   . HCAP (healthcare-associated pneumonia) 11/21/2011  . High cholesterol   . Hypertension   . Metabolic encephalopathy 7/0/9628  . Mitral regurgitation 03/11/2012  . Pancreatitis    ~2008, 11/2011 post ERCP  . Pneumonia 12/2011   "first time I know about"  . Sepsis ([place]) 11/27/2013  . SIRS (systemic inflammatory response syndrome) ([place]) 03/13/2012   a/w post ERCP  pancreatitis.   . Type II diabetes mellitus ([place])    Past Surgical History:  Procedure Laterality Date  . APPENDECTOMY  ~ 1960  . CATARACT EXTRACTION W/ INTRAOCULAR LENS  IMPLANT, BILATERAL  2000's  . CHOLECYSTECTOMY  1990's   Lap chole  . COLONOSCOPY  01/2003   diverticulosis, 35m hyperplastic sigmoid polyp  . DILATION AND CURETTAGE OF UTERUS    . ERCP  11/13/2011   Procedure: ENDOSCOPIC RETROGRADE CHOLANGIOPANCREATOGRAPHY (ERCP);  Surgeon: PBeryle Beams MD;  [facility];  Service: Endoscopy;  Laterality: N/A;  . FLEXIBLE SIGMOIDOSCOPY N/A 05/02/2014   Procedure: FLEXIBLE SIGMOIDOSCOPY;  Surgeon: CGatha Mayer MD;  Location: [facility] ENDOSCOPY;  Service: Endoscopy;  Laterality: N/A;  .  TONSILLECTOMY AND ADENOIDECTOMY  ~ 1939  . VAGINAL HYSTERECTOMY  ~ 1960's   Social History   Socioeconomic History  . Marital status: Married    Spouse name: [name]  . Number of children: 0  . Years of education: Not on file  . Highest education level: Not on file  Occupational History  . Not on file  Social Needs  . Financial resource strain: Not on file  . Food insecurity:    Worry: Not on file    Inability: Not on file  . Transportation needs:    Medical: Not on file    Non-medical: Not on file  Tobacco Use  . Smoking status: Never Smoker  . Smokeless tobacco: Never Used  Substance and Sexual Activity  . Alcohol use: No  . Drug use: No  . Sexual activity: Not Currently  Lifestyle  . Physical activity:    Days per week: Not on file    Minutes per session: Not on file  . Stress: Not on file  Relationships  . Social connections:    Talks on phone: Not on file    Gets together: Not on file    Attends religious service: Not on file    Active member of club or organization: Not on file    Attends meetings of clubs or organizations: Not on file    Relationship status: Not on file  Other Topics Concern  . Not on  file  Social History Narrative   Lives at home with her husband.   No children   Family History  Problem Relation Age of Onset  . Stroke Father 48  . Stroke Mother 69  . Anesthesia problems Neg Hx   . Hypotension Neg Hx   . Malignant hyperthermia Neg Hx   . Pseudochol deficiency Neg Hx   . Stomach cancer Neg Hx   . Esophageal cancer Neg Hx    Allergies  Allergen Reactions  . Lactose Intolerance (Gi) Nausea And Vomiting    severe stomach pain  . Penicillins Hives and Itching    "haven't used any since 1970's" "haven't used any since 1970's"  . Sulfa Antibiotics Hives and Itching    "haven't used any since 1970's"  . Ciprofloxacin Swelling  . Sulfasalazine Hives and Itching    "haven't used any since 1970's"   Prior to Admission medications     Medication Sig Start Date End Date Taking? Authorizing Provider  dicyclomine (BENTYL) 20 MG tablet TAKE 1/2 TABLET BY MOUTH EVERY 6 HOURS AS NEEDED FOR SPASMS(ABDOMINAL PAIN) 09/16/16  Yes Gatha Mayer, MD  diltiazem (DILT-XR) 120 MG 24 hr capsule TAKE 1 CAPSULE(120 MG) BY MOUTH DAILY 01/04/18  Yes Minus Breeding, MD  famotidine (PEPCID) 20 MG tablet Take 20 mg by mouth 2 (two) times daily.   Yes [provider]  gabapentin (NEURONTIN) 300 MG capsule Take 300 mg by mouth 3 (three) times daily. 12/24/17  Yes [provider]  glimepiride (AMARYL) 4 MG tablet Take 4 mg by mouth daily with breakfast.    Yes [provider]  HYDROcodone-acetaminophen (NORCO/VICODIN) 5-325 MG tablet Take 1 tablet by mouth every 6 (six) hours as needed. 11/01/17  Yes Alma Friendly, MD  LANTUS SOLOSTAR 100 UNIT/ML Solostar Pen Inject 22 Units into the skin daily.  09/01/13  Yes [provider]  latanoprost (XALATAN) 0.005 % ophthalmic solution Place 1 drop into both eyes at bedtime.   Yes [provider]  loperamide (LOPERAMIDE A-D) 2 MG tablet Take 1 tablet (2 mg total) by mouth as needed for diarrhea or loose stools (use at bedtime). Patient taking differently: Take 2 mg by mouth at bedtime.  11/27/14  Yes Gatha Mayer, MD  LORazepam (ATIVAN) 1 MG tablet Take 1 tablet (1 mg total) by mouth 3 (three) times daily. 11/01/17  Yes Alma Friendly, MD  methylphenidate (RITALIN) 5 MG tablet Take 1 tablet (5 mg total) by mouth See admin instructions. take 50m in the morning daily and then 544mtwice a day on every other day 11/01/17  Yes EzAlma FriendlyMD  metoprolol tartrate (LOPRESSOR) 25 MG tablet TAKE 1 TABLET(25 MG) BY MOUTH TWICE DAILY WITH FOOD 12/13/17  Yes HoMinus BreedingMD  mirtazapine (REMERON) 7.5 MG tablet TAKE 1 TABLET(7.5 MG) BY MOUTH AT BEDTIME 10/06/16  Yes GeGatha MayerMD  Multiple Vitamin (MULTIVITAMIN WITH MINERALS) TABS Take 1 tablet by mouth  daily.   Yes [provider]  omeprazole (PRILOSEC) 20 MG capsule Take 20 mg by mouth 2 (two) times daily.    Yes [provider]  oxybutynin (DITROPAN) 5 MG tablet Take 1 tablet by mouth at bedtime. 07/29/16  Yes [provider]  promethazine (PHENERGAN) 25 MG tablet Take 25 mg by mouth 3 (three) times daily as needed for nausea or vomiting.   Yes [provider]  sucralfate (CARAFATE) 1 g tablet Take 1 g by mouth 2 (two) times daily.  Yes [provider]  SYMBICORT 160-4.5 MCG/ACT inhaler Inhale 2 puffs into the lungs 2 (two) times daily.  03/08/14  Yes [provider]  timolol (TIMOPTIC) 0.5 % ophthalmic solution  06/02/15  Yes [provider]  warfarin (COUMADIN) 5 MG tablet Take 2.5-5 mg by mouth daily. Take 1.65m every Sun/Wed then 2.5 mg the rest of the week   Yes [provider]   Ct Head Wo Contrast  Result Date: 01/06/2018 CLINICAL DATA:  Head trauma, patient on anticoagulation EXAM: CT HEAD WITHOUT CONTRAST TECHNIQUE: Contiguous axial images were obtained from the base of the skull through the vertex without intravenous contrast. COMPARISON:  None. FINDINGS: Brain: No evidence of acute infarction, hemorrhage, extra-axial collection, ventriculomegaly, or mass effect. Generalized cerebral atrophy. Periventricular white matter low attenuation likely secondary to microangiopathy. Vascular: Cerebrovascular atherosclerotic calcifications are noted. Skull: Negative for fracture or focal lesion. Sinuses/Orbits: Visualized portions of the orbits are unremarkable. Visualized portions of the paranasal sinuses and mastoid air cells are unremarkable. Other: None. IMPRESSION: No acute intracranial pathology. Electronically Signed   By: HKathreen Devoid  On: 01/06/2018 16:22   Ct Hip Left Wo Contrast  Result Date: 01/06/2018 CLINICAL DATA:  Left hip fracture. EXAM: CT OF THE LEFT HIP WITHOUT CONTRAST TECHNIQUE: Multidetector CT imaging of  the left hip was performed according to the standard protocol. Multiplanar CT image reconstructions were also generated. COMPARISON:  Left hip x-rays from same day. FINDINGS: Bones/Joint/Cartilage Acute transcervical fracture of the left femoral neck with apex anterior angulation. No additional fracture seen. No dislocation. Osteopenia. Small left hip joint effusion. Ligaments Suboptimally assessed by CT. Muscles and Tendons Mild atrophy of the gluteal muscles. Visualized tendons are grossly unremarkable. Soft tissues No hematoma.  Sigmoid diverticulosis. IMPRESSION: 1. Acute transcervical fracture of the left femoral neck. Electronically Signed   By: WTitus DubinM.D.   On: 01/06/2018 23:16   Chest Portable 1 View  Result Date: 01/06/2018 CLINICAL DATA:  84y/o  F; dry cough and fatigue. EXAM: PORTABLE CHEST 1 VIEW COMPARISON:  03/10/2014 chest radiograph. FINDINGS: Stable cardiac silhouette given projection and technique. Aortic atherosclerosis with calcification. Elevated right hemidiaphragm with streaky right basilar opacity. Reticular and peripheral linear opacities at the left lung base. No acute osseous abnormality is evident. IMPRESSION: 1. Elevated right hemidiaphragm and streaky right basilar opacity probably representing atelectasis. 2. Mild interstitial pulmonary edema. 3. Aortic atherosclerosis. Electronically Signed   By: LKristine GarbeM.D.   On: 01/06/2018 18:30   Dg Hip Unilat W Or Wo Pelvis 2-3 Views Left  Result Date: 01/06/2018 CLINICAL DATA:  84y/o  F; severe left hip pain after fall. EXAM: DG HIP (WITH OR WITHOUT PELVIS) 2-3V LEFT COMPARISON:  None. FINDINGS: Acute fracture of the left proximal femur which appears to be a basicervical neck fracture, possibly extending into the greater trochanter. Coxa vara angulation. Hip joints are maintained. No pelvic fracture or diastasis. IMPRESSION: Acute left proximal femur fracture which appears to be a basicervical neck fracture,  possibly extending into the greater trochanter. Coxa vara angulation. Electronically Signed   By: LKristine GarbeM.D.   On: 01/06/2018 15:26   Family History Reviewed and non-contributory, no pertinent history of problems with bleeding or anesthesia      Review of Systems 14 system ROS conducted and negative except for that noted in HPI   OBJECTIVE  Vitals: Patient Vitals for the past 8 hrs:  BP Temp Temp src Pulse Resp SpO2  01/07/18 0409 (Marland Kitchen  155/89 97.8 F (36.6 C) Oral (!) 101 20 95 %  01/07/18 0014 (!) 144/88 98.4 F (36.9 C) Oral (!) 102 (!) 22 94 %   General: Alert, no acute distress Cardiovascular: No pedal edema Respiratory: No cyanosis, no use of accessory musculature GI: No organomegaly, abdomen is soft and non-tender Skin: No lesions in the area of chief complaint other than those listed below in MSK exam.  Neurologic: Sensation intact distally save for the below mentioned MSK exam Psychiatric: Patient is competent for consent with normal mood and affect Lymphatic: No axillary or cervical lymphadenopathy Extremities  RLE: Shortened and externally rotated.  ROM deferred. + GS/TA/EHL. Sensation intact in DP/SP/S/S/P distributions. 2+ DP pulse with warm and well perfused digits. Compartments soft and compressible, with no pain on passive stretch.     Test Results Imaging R hip xr demonstrates inadequate rotational profile to call fracture, worrisome for femoral neck fracture, CT scan clarfies and demonstrates acute displaced femoral neck fracture  Labs cbc Recent Labs    01/06/18 1541  WBC 19.2*  HGB 15.5*  HCT 45.5  PLT 252    Labs inflam No results for input(s): CRP in the last 72 hours.  Invalid input(s): ESR  Labs coag Recent Labs    01/06/18 1541  INR 2.06    Recent Labs    01/06/18 1424  NA 138  K 5.3*  CL 101  CO2 26  GLUCOSE 148*  BUN 11  CREATININE 0.82  CALCIUM 9.6     ASSESSMENT AND PLAN: 84 y.o. female with the  following: L femoral neck fracture, displaced.  Discussed the nature of the injury as well as the care with the patient as well as the family.  Nonoperative measures are not well tolerated as patient's on bedrest for extended periods of time tend to develop secondary issues such as pneumonia, urinary tract infections, bedsores and delirium.  Based on this our recommendation is for operative measures  The risks benefits and alternatives were discussed with the patient including but not limited to the risks of nonoperative treatment, versus surgical intervention including infection, bleeding, nerve injury, periprosthetic fracture, the need for revision surgery, dislocation, leg length discrepancy, gait change, blood clots, cardiopulmonary complications, morbidity, mortality, among others, and they were willing to proceed.     Plan for L hip hemiarthroplasty this afternoon, INR needs to be less than 2 but ideally more in the 1.5 range to reduce risk of hematoma.  OK to proceed as long as less than 2.  May need additional Vitamin K to reverse and or FFP. Marland Kitchen

## 2018-01-07 NOTE — Op Note (Signed)
Orthopaedic Surgery Operative Note (CSN: [id number])  [name] [name]  1930-06-11 Date of Surgery: 01/06/2018 - 01/07/2018   Diagnoses:  L displaced femoral neck fracture  Procedure: Left hip hemiarthroplasty 27236   Operative Finding Successful completion of planned procedure.  Small amount of calcar bone removed by fracture but no extension.  Appeared intact throughout.  Moderate bone quality.  Bleeding controlled at end of case.  Post-operative plan: The patient will be WBAT.  The patient will be admitted back to medicine.  DVT prophylaxis with baseline coumadin.  Pain control with PRN pain medication preferring oral medicines.  Follow up plan will be scheduled in approximately 14 days for incision check and XR.  Post-Op Diagnosis: Same Surgeons:Primary: Hiram Gash, MD Assistants:Brandon Leslee Home OPA Location: [facility] OR ROOM 06 Anesthesia: General Antibiotics: Ancef 2g preop, Vancomycin 1000mg  locally  Tourniquet time: * No tourniquets in log * Estimated Blood Loss: 782 Complications: None Specimens: None Implants: Implant Name Type Inv. Item Serial No. Manufacturer Lot No. LRB No. Used Action  ACCOLADE 127 DEGREE NECK ANGLE HIP STEM SIZE #1, STEM LENGTH 96MM    [employer] [id number] Left 1 Implanted  HEAD MODULAR ENDO - [id number] Orthopedic Implant HEAD MODULAR ENDO  [employer] [id number] Left 1 Implanted  SLEEVE UNITRAX V40 STD - [id number] Orthopedic Implant SLEEVE UNITRAX V40 STD  [employer] [id number] Left 1 Implanted    Indications for Surgery:   [name] [name] is a 84 y.o. female with fall resulting in above injury.  The risks benefits and alternatives were discussed with the patient and her husband including but not limited to the risks of nonoperative treatment, versus surgical intervention including infection, bleeding, nerve injury, periprosthetic fracture, the need for revision surgery, dislocation, leg length discrepancy, gait change, blood clots,  cardiopulmonary complications, morbidity, mortality, among others, and they were willing to proceed.      Procedure:   The patient was identified in the preoperative holding area where the surgical site was marked. The patient was taken to the OR where a procedural timeout was called and the above noted anesthesia was induced.  The patient was positioned lateral on a regular bed.  Preoperative antibiotics were dosed.  The patient's left hip was prepped and draped in the usual sterile fashion.  A second preoperative timeout was called.      We made an incision centered over the greater trochanter with a scalpel. We used the scalpel to continue to dissect to the fascia. The fascia was pierced with Bovie electrocautery. Mayo scissors were used to cut the fascia in a longitudinal fashion. The gluteus maximus fibers were bluntly split in line with their fibers. The femur was slowly internally rotated, putting tension on the posterior structures. Bovie electrocautery was used to dissect the short external rotators off of the insertion onto the femur. After the short external rotators were transected, we visualized the femoral neck fracture. We identified the sciatic nerve by palpation and verified that it was not in danger from dissection. We made a T-shaped capsulotomy. We made a neck cut approximately 1 cm above the lesser trochanter with a a reciprocating saw. We removed the femoral head. We used the box cutter to cut away some of the greater trochanter for ease of insertion of the stem. We then used the canal finder to locate the femoral canal. Using the angle of the femoral neck as our guide for version, we broached sequentially. We trialed components and found appropriate fit and stability.  The patient would come  to full extension of the hip. The hip was stable at 90, adduction and 50 IR.  We then removed the trials as well as the broach. We ensured there were no foreign bodies or bone within the acetabulum.  We copiously irrigated the wound.  We then carefully placed our stem, size listed above before assembling the head and neck together onto the stem. We then reduced the hip. Again, that was stable in the previously mentioned manipulations. We repaired the posterior capsule. Short external rotators repaired to the greater trochanter. We closed the fascia of the iliotibial band and gluteus maximus with running and intterupted Vicryl sutures. We then closed Scarpa's fascia with running Vicryl sutures. Skin was closed with absorbable sutures.  Aquacel placed and abduction pillow..   The patient was awoken from general anesthesia and taken to the PACU in stable condition without complication.    [name] [name], OPA-C, present and scrubbed throughout the case, critical for completion in a timely fashion, and for retraction, instrumentation, closure.

## 2018-01-07 NOTE — Progress Notes (Signed)
Received a call from Dr. Griffin Basil concerning morning labs that were not drawn, including an INR. Physician asked nurse to follow up with lab to get the blood work drawn. Called lab and lab stated they could not see the orders in Epic.  Reorderd 5am blood work and discontinued previous orders. Lab tech states that she now sees the orders and will get the blood drawn as soon as possible.

## 2018-01-07 NOTE — Anesthesia Procedure Notes (Signed)
Procedure Name: Intubation Date/Time: 01/07/2018 4:00 PM Performed by: Jenne Campus, CRNA Pre-anesthesia Checklist: Patient identified, Emergency Drugs available, Suction available and Patient being monitored Patient Re-evaluated:Patient Re-evaluated prior to induction Oxygen Delivery Method: Circle System Utilized Preoxygenation: Pre-oxygenation with 100% oxygen Induction Type: IV induction Ventilation: Mask ventilation without difficulty Laryngoscope Size: Miller and 2 Grade View: Grade II Tube type: Oral Tube size: 7.0 mm Number of attempts: 1 Airway Equipment and Method: Stylet and Oral airway Placement Confirmation: ETT inserted through vocal cords under direct vision,  positive ETCO2 and breath sounds checked- equal and bilateral Secured at: 21 cm Tube secured with: Tape Dental Injury: Teeth and Oropharynx as per pre-operative assessment

## 2018-01-08 LAB — BASIC METABOLIC PANEL
Anion gap: 15 (ref 5–15)
BUN: 19 mg/dL (ref 6–20)
CALCIUM: 8.7 mg/dL — AB (ref 8.9–10.3)
CHLORIDE: 101 mmol/L (ref 101–111)
CO2: 20 mmol/L — AB (ref 22–32)
Creatinine, Ser: 0.96 mg/dL (ref 0.44–1.00)
GFR calc non Af Amer: 52 mL/min — ABNORMAL LOW (ref >60)
Glucose, Bld: 225 mg/dL — ABNORMAL HIGH (ref 65–99)
Potassium: 3.7 mmol/L (ref 3.5–5.1)
SODIUM: 136 mmol/L (ref 135–145)

## 2018-01-08 LAB — CBC
HEMATOCRIT: 36.8 % (ref 36.0–46.0)
HEMOGLOBIN: 11.9 g/dL — AB (ref 12.0–15.0)
MCH: 30.1 pg (ref 26.0–34.0)
MCHC: 32.3 g/dL (ref 30.0–36.0)
MCV: 92.9 fL (ref 78.0–100.0)
Platelets: 236 10*3/uL (ref 150–400)
RBC: 3.96 MIL/uL (ref 3.87–5.11)
RDW: 13.9 % (ref 11.5–15.5)
WBC: 11.6 10*3/uL — ABNORMAL HIGH (ref 4.0–10.5)

## 2018-01-08 LAB — GLUCOSE, CAPILLARY
GLUCOSE-CAPILLARY: 281 mg/dL — AB (ref 65–99)
Glucose-Capillary: 223 mg/dL — ABNORMAL HIGH (ref 65–99)
Glucose-Capillary: 231 mg/dL — ABNORMAL HIGH (ref 65–99)
Glucose-Capillary: 231 mg/dL — ABNORMAL HIGH (ref 65–99)

## 2018-01-08 LAB — URINE CULTURE: Culture: >=100000 — AB

## 2018-01-08 MED ORDER — ENOXAPARIN SODIUM 80 MG/0.8ML ~~LOC~~ SOLN
1.0000 mg/kg | Freq: Two times a day (BID) | SUBCUTANEOUS | Status: DC
  Administered 2018-01-08 – 2018-01-10 (×4): 65 mg via SUBCUTANEOUS
  Filled 2018-01-08 (×4): qty 0.8

## 2018-01-08 MED ORDER — WARFARIN - PHARMACIST DOSING INPATIENT: Freq: Every day | Status: DC

## 2018-01-08 MED ORDER — WARFARIN SODIUM 4 MG PO TABS
4.0000 mg | ORAL_TABLET | Freq: Once | ORAL | Status: AC
  Administered 2018-01-08: 4 mg via ORAL
  Filled 2018-01-08: qty 1

## 2018-01-08 MED ORDER — ENOXAPARIN SODIUM 30 MG/0.3ML ~~LOC~~ SOLN
30.0000 mg | Freq: Once | SUBCUTANEOUS | Status: AC
  Administered 2018-01-08: 30 mg via SUBCUTANEOUS
  Filled 2018-01-08: qty 0.3

## 2018-01-08 MED ORDER — CEFTRIAXONE SODIUM 1 G IJ SOLR
1.0000 g | INTRAMUSCULAR | Status: DC
Start: 2018-01-08 — End: 2018-01-11
  Administered 2018-01-08 – 2018-01-11 (×4): 1 g via INTRAVENOUS
  Filled 2018-01-08 (×5): qty 10

## 2018-01-08 NOTE — Evaluation (Signed)
Physical Therapy Evaluation Patient Details Name: [name] [name] MRN: [id number] DOB: 12-19-1929 Today's Date: 01/08/2018   History of Present Illness  Patient fell in her home. She uses a walker but was not at the time. She suffered a left hip fracture. She required a left hemiarthroslasty on 01/08/2018. PMH: SIRS, sepsis, DMII, Metabolic encephalopathy, COPD, Low back pain, carotid stenosis, C-diff, a-fib   Clinical Impression  Patient required min a to stand. She was able to ambulate 8' with min guard before fatiguing. She is motivated to participate. She would benefit from skilled acute therapy as well as further therapy at a rehab center to improv gait distance and stability before returning home.     Follow Up Recommendations SNF    Equipment Recommendations  Rolling walker with 5" wheels    Recommendations for Other Services Rehab consult     Precautions / Restrictions Precautions Precautions: Fall;Posterior Hip Precaution Comments: Patient and husband given posterior aproach percaution sheet  Restrictions Weight Bearing Restrictions: Yes Other Position/Activity Restrictions: WBAT      Mobility  Bed Mobility Overal bed mobility: Needs Assistance Bed Mobility: Sit to Supine       Sit to supine: Min assist   General bed mobility comments: able to move her left leg out of the bed with minimal assistance  Transfers Overall transfer level: Needs assistance Equipment used: Rolling walker (2 wheeled) Transfers: Sit to/from Stand Sit to Stand: Min assist         General transfer comment: min a for strength and stability to stand. Once standing patient able to weight bear on the left leg.   Ambulation/Gait Ambulation/Gait assistance: Min assist Ambulation Distance (Feet): 8 Feet Assistive device: Rolling walker (2 wheeled) Gait Pattern/deviations: Step-to pattern   Gait velocity interpretation: Below normal speed for age/gender General Gait Details: slow step  to gait pattern. Mod cuing for weight shifting onto the left leg. patient fatigued and needed to sit   Stairs            Wheelchair Mobility    Modified Rankin (Stroke Patients Only)       Balance Overall balance assessment: Needs assistance Sitting-balance support: Single extremity supported Sitting balance-Leahy Scale: Fair Sitting balance - Comments: hesitant to sit on left hip    Standing balance support: Bilateral upper extremity supported Standing balance-Leahy Scale: Poor Standing balance comment: needed min guard for balance                              Pertinent Vitals/Pain Pain Assessment: 0-10 Pain Score: 5  Pain Location: left hip  Pain Descriptors / Indicators: Aching Pain Intervention(s): Limited activity within patient's tolerance;Monitored during session;Repositioned    Home Living Family/patient expects to be discharged to:: Skilled nursing facility Living Arrangements: Spouse/significant other Available Help at Discharge: Family;Available 24 hours/day Type of Home: House Home Access: Ramped entrance     Home Layout: One level Home Equipment: Walker - 4 wheels      Prior Function Level of Independence: Needs assistance   Gait / Transfers Assistance Needed: Patient reports she was trying to get out more and eat when she could. She was using a walker but was not when she fell.    ADL's / Homemaking Assistance Needed: she does her own sponge bath and dresses herself.         Hand Dominance        Extremity/Trunk Assessment   Upper Extremity Assessment Upper Extremity  Assessment: Overall WFL for tasks assessed    Lower Extremity Assessment Lower Extremity Assessment: LLE deficits/detail LLE: Unable to fully assess due to pain LLE Sensation: WNL       Communication   Communication: No difficulties  Cognition Arousal/Alertness: Awake/alert Behavior During Therapy: WFL for tasks assessed/performed Overall Cognitive  Status: Within Functional Limits for tasks assessed                                        General Comments      Exercises     Assessment/Plan    PT Assessment Patient needs continued PT services  PT Problem List Decreased strength;Decreased range of motion;Decreased activity tolerance;Decreased mobility;Pain;Decreased safety awareness       PT Treatment Interventions Gait training;Stair training;Functional mobility training;Therapeutic activities;Therapeutic exercise;Neuromuscular re-education;DME instruction    PT Goals (Current goals can be found in the Care Plan section)  Acute Rehab PT Goals Patient Stated Goal: to walk further  PT Goal Formulation: With patient/family Time For Goal Achievement: 01/22/18 Potential to Achieve Goals: Good    Frequency Min 5X/week   Barriers to discharge        Co-evaluation               AM-PAC PT "6 Clicks" Daily Activity  Outcome Measure Difficulty turning over in bed (including adjusting bedclothes, sheets and blankets)?: Unable Difficulty moving from lying on back to sitting on the side of the bed? : Unable Difficulty sitting down on and standing up from a chair with arms (e.g., wheelchair, bedside commode, etc,.)?: Unable Help needed moving to and from a bed to chair (including a wheelchair)?: A Lot Help needed walking in hospital room?: A Lot Help needed climbing 3-5 steps with a railing? : Total 6 Click Score: 8    End of Session Equipment Utilized During Treatment: Gait belt Activity Tolerance: Patient tolerated treatment well Patient left: in chair;with bed alarm set;with family/visitor present Nurse Communication: Mobility status PT Visit Diagnosis: Unsteadiness on feet (R26.81);Difficulty in walking, not elsewhere classified (R26.2);Muscle weakness (generalized) (M62.81);Other abnormalities of gait and mobility (R26.89)    Time: 1010-1031 PT Time Calculation (min) (ACUTE ONLY): 21  min   Charges:   PT Evaluation $PT Eval Moderate Complexity: 1 Mod     PT G Codes:          Carney Living PT DPT 01/08/2018, 12:50 PM

## 2018-01-08 NOTE — Progress Notes (Addendum)
Patient ID: [name] [name], female   DOB: March 25, 1930, 84 y.o.   MRN: [id number]                                                                PROGRESS NOTE                                                                                                                                                                                                             Patient Demographics:    [name] [name], is a 84 y.o. female, DOB - 1930-02-06, YHC:[id number]  Admit date - 01/06/2018   Admitting Physician [name], MD  Outpatient Primary MD for the patient is [name], MD  LOS - 2  Outpatient Specialists:     No chief complaint on file.      Brief Narrative    84 y.o. female with history of chronic atrial fibrillation on Coumadin, diabetes mellitus type 2, hypertension, hyperlipidemia, COPD, C. difficile colitis, who presents with a mechanical fall and hip pain.  Patient ambulates with a walker at baseline due to balance problems but she practices with a walker at home.  Today, she was attempting to walk her closet to the bathroom she suddenly lost her balance and fell on her left side hitting her head and her left hip, for immediate pain in her left hip that was severe.  She was unable to get up without assistance.  She had to wait for her husband returned from walking her dog for help.  She has very severe pain in the left lateral with any sort of movement but feels relatively comfortable when resting still.  Last few days she has felt a little fatigued and has had a mild cough but has otherwise been feeling well.  She denies dysuria, increased urinary frequency or urgency, or fevers.    ED Course: Vital signs afebrile, mildly tachycardic, stable blood pressure, mildly hypoxic on room air at 86% which improved with 2 L nasal cannula.  Labs: Mild hyperkalemia at 5.3 (difficult stick), white blood cell count 19.2, INR 2.06.  X-ray of the hips demonstrated an acute left proximal femur  fracture which appears to be a basocervical neck fracture possibly extending into the greater trochanter with some angulation.  ER physician discussed the case with Dr. Griffin Basil for  orthopedic surgery who is asked that the patient be admitted over at [facility] for possible surgery tomorrow.       Subjective:    [name] [name] today is POD#1, PT has not yet seen her.  She did have a good BM.   No headache, No chest pain, No abdominal pain - No Nausea, No new weakness tingling or numbness, No Cough - SOB.    Assessment  & Plan :    Principal Problem:   Hip fracture ([place]) Active Problems:   DM II (diabetes mellitus, type II), controlled (HCC)   Atrial fibrillation    COPD (chronic obstructive pulmonary disease) (HCC)   DM neuropathy, type II diabetes mellitus ([place])    Acute proximal left femur fracture --Bedrest, pain control, SCDs for DVT prophylaxis --Given vitamin K 4/4 to reverse INR.  INR within acceptable limits. --No previous history of cardiac disease other than atrial fibrillation which is stable  Hyperkalemia --Resolved.  Chronic atrial fibrillation on warfarin. CHA2DS2-VASc 4.  EKG atrial fibrillation on admission, no acute changes. --Resume Warfarin, pharmacy to dose  , Lovenox 1mg /kg Patrick bid x 3 days  --Continue diltiazem and metoprolol --Given atrial fibrillation is stable, discontinue telemetry  Diabetes mellitus type 2 with diabetic neuropathy.  Apparently not improved with gabapentin, consider tapering off as an outpatient. --CBG stable --Continue to hold oral medications, continue sliding scale insulin, continue Lantus at reduced dose until taking diet  COPD --Appears stable.  Continue Symbicort.  Bronchodilators as needed.  Aortic atherosclerosis       Code Status : FULL CODE  Family Communication  : w patient  Disposition Plan  :  TBD  Barriers For Discharge :   Consults  :  orthopedics  Procedures  : L hip hemiarthroplasty  4/5  DVT Prophylaxis  :  Coumadin, SCDs   Lab Results  Component Value Date   PLT 236 01/08/2018    Antibiotics  :    Anti-infectives (From admission, onward)   Start     Dose/Rate Route Frequency Ordered Stop   01/07/18 1945  ceFAZolin (ANCEF) IVPB 2g/100 mL premix     2 g 200 mL/hr over 30 Minutes Intravenous Every 6 hours 01/07/18 1937 01/08/18 0238   01/07/18 1700  ceFAZolin (ANCEF) IVPB 2 g/50 mL premix     2 g 100 mL/hr over 30 Minutes Intravenous  Once 01/07/18 1647 01/07/18 1650   01/07/18 1544  vancomycin (VANCOCIN) powder  Status:  Discontinued       As needed 01/07/18 1544 01/07/18 1747        Objective:   Vitals:   01/07/18 1845 01/07/18 2022 01/08/18 0224 01/08/18 0511  BP:  (!) 125/56 122/82 (!) 146/109  Pulse:  (!) 117 80 94  Resp:      Temp: 97.7 F (36.5 C) 98.9 F (37.2 C)  97.8 F (36.6 C)  TempSrc:  Oral  Oral  SpO2:  98% 95% 97%  Weight:      Height:        Wt Readings from Last 3 Encounters:  01/06/18 63 kg (139 lb)  10/28/17 63.4 kg (139 lb 12.4 oz)  10/24/17 65.8 kg (145 lb)     Intake/Output Summary (Last 24 hours) at 01/08/2018 0909 Last data filed at 01/07/2018 1729 Gross per 24 hour  Intake 1050 ml  Output 600 ml  Net 450 ml     Physical Exam  Awake Alert, Oriented X 3, No new F.N deficits, Normal affect [place].AT,PERRAL  Supple Neck,No JVD, No cervical lymphadenopathy appriciated.  Symmetrical Chest wall movement, Good air movement bilaterally, CTAB RRR,No Gallops,Rubs or new Murmurs, No Parasternal Heave +ve B.Sounds, Abd Soft, No tenderness, No organomegaly appriciated, No rebound - guarding or rigidity. No Cyanosis, Clubbing or edema, No new Rash or bruise     Data Review:    CBC Recent Labs  Lab 01/06/18 1541 01/07/18 1123 01/08/18 0443  WBC 19.2* 13.3* 11.6*  HGB 15.5* 14.6 11.9*  HCT 45.5 43.6 36.8  PLT 252 232 236  MCV 91.7 91.0 92.9  MCH 31.3 30.5 30.1  MCHC 34.1 33.5 32.3  RDW 13.3 13.4 13.9  LYMPHSABS  2.4  --   --   MONOABS 0.8  --   --   EOSABS 0.1  --   --   BASOSABS 0.0  --   --     Chemistries  Recent Labs  Lab 01/06/18 1424 01/07/18 1123 01/08/18 0443  NA 138 136 136  K 5.3* 3.7 3.7  CL 101 100* 101  CO2 26 22 20*  GLUCOSE 148* 195* 225*  BUN 11 9 19   CREATININE 0.82 0.85 0.96  CALCIUM 9.6 8.9 8.7*   ------------------------------------------------------------------------------------------------------------------ No results for input(s): CHOL, HDL, LDLCALC, TRIG, CHOLHDL, LDLDIRECT in the last 72 hours.  Lab Results  Component Value Date   HGBA1C 7.1 (H) 01/07/2018   ------------------------------------------------------------------------------------------------------------------ No results for input(s): TSH, T4TOTAL, T3FREE, THYROIDAB in the last 72 hours.  Invalid input(s): FREET3 ------------------------------------------------------------------------------------------------------------------ No results for input(s): VITAMINB12, FOLATE, FERRITIN, TIBC, IRON, RETICCTPCT in the last 72 hours.  Coagulation profile Recent Labs  Lab 01/06/18 1541 01/07/18 1123  INR 2.06 1.27    No results for input(s): DDIMER in the last 72 hours.  Cardiac Enzymes No results for input(s): CKMB, TROPONINI, MYOGLOBIN in the last 168 hours.  Invalid input(s): CK ------------------------------------------------------------------------------------------------------------------ No results found for: BNP  Inpatient Medications  Scheduled Meds: . acetaminophen  1,000 mg Oral Q6H  . diltiazem  120 mg Oral Daily  . docusate sodium  100 mg Oral BID  . enoxaparin (LOVENOX) injection  40 mg Subcutaneous Q24H  . famotidine  20 mg Oral BID  . gabapentin  100 mg Oral TID  . insulin aspart  0-9 Units Subcutaneous Q6H  . insulin glargine  10 Units Subcutaneous Q2200  . latanoprost  1 drop Both Eyes QHS  . methylphenidate  5 mg Oral Daily  . metoprolol tartrate  25 mg Oral BID  .  mirtazapine  7.5 mg Oral QHS  . mometasone-formoterol  2 puff Inhalation BID  . multivitamin with minerals  1 tablet Oral Daily  . oxybutynin  5 mg Oral QHS  . pantoprazole  80 mg Oral Daily  . sucralfate  1 g Oral BID  . timolol  1 drop Both Eyes Daily   Continuous Infusions: . sodium chloride 50 mL/hr at 01/07/18 2137   PRN Meds:.acetaminophen, bisacodyl, dicyclomine, HYDROcodone-acetaminophen, ipratropium-albuterol, loperamide, LORazepam, magnesium citrate, menthol-cetylpyridinium **OR** phenol, metoCLOPramide **OR** metoCLOPramide (REGLAN) injection, morphine injection, ondansetron **OR** ondansetron (ZOFRAN) IV, oxyCODONE, polyethylene glycol, promethazine  Micro Results Recent Results (from the past 240 hour(s))  Culture, Urine     Status: Abnormal (Preliminary result)   Collection Time: 01/06/18  7:24 PM  Result Value Ref Range Status   Specimen Description   Final    URINE, CLEAN CATCH Performed at [facility] [address], [place], [place] [address]    Special Requests   Final    NONE Performed at  [facility], [place] [address], [place], [place] [address]    Culture >=100,000 COLONIES/mL ESCHERICHIA COLI (A)  Final   Report Status PENDING  Incomplete  Surgical pcr screen     Status: None   Collection Time: 01/07/18  9:38 AM  Result Value Ref Range Status   MRSA, PCR NEGATIVE NEGATIVE Final   Staphylococcus aureus NEGATIVE NEGATIVE Final    Comment: (NOTE) The Xpert SA Assay (FDA approved for NASAL specimens in patients 52 years of age and older), is one component of a comprehensive surveillance program. It is not intended to diagnose infection nor to guide or monitor treatment. Performed at [facility], [place] [address] [place],  [address]     Radiology Reports Ct Head Wo Contrast  Result Date: 01/06/2018 CLINICAL DATA:  Head trauma, patient on anticoagulation EXAM: CT HEAD WITHOUT CONTRAST TECHNIQUE:  Contiguous axial images were obtained from the base of the skull through the vertex without intravenous contrast. COMPARISON:  None. FINDINGS: Brain: No evidence of acute infarction, hemorrhage, extra-axial collection, ventriculomegaly, or mass effect. Generalized cerebral atrophy. Periventricular white matter low attenuation likely secondary to microangiopathy. Vascular: Cerebrovascular atherosclerotic calcifications are noted. Skull: Negative for fracture or focal lesion. Sinuses/Orbits: Visualized portions of the orbits are unremarkable. Visualized portions of the paranasal sinuses and mastoid air cells are unremarkable. Other: None. IMPRESSION: No acute intracranial pathology. Electronically Signed   By: Kathreen Devoid   On: 01/06/2018 16:22   Pelvis Portable  Result Date: 01/07/2018 CLINICAL DATA:  Left hip hemiarthroplasty. EXAM: PORTABLE PELVIS 1-2 VIEWS COMPARISON:  CT left hip from yesterday. FINDINGS: The left hip demonstrates a hemiarthroplasty without evidence of hardware failure or complication. There is expected intra-articular air. There is no fracture or dislocation. The alignment is anatomic. Post-surgical changes noted in the surrounding soft tissues. IMPRESSION: Interval left hip hemiarthroplasty without evidence of acute postoperative complication. Electronically Signed   By: Titus Dubin M.D.   On: 01/07/2018 19:17   Ct Hip Left Wo Contrast  Result Date: 01/06/2018 CLINICAL DATA:  Left hip fracture. EXAM: CT OF THE LEFT HIP WITHOUT CONTRAST TECHNIQUE: Multidetector CT imaging of the left hip was performed according to the standard protocol. Multiplanar CT image reconstructions were also generated. COMPARISON:  Left hip x-rays from same day. FINDINGS: Bones/Joint/Cartilage Acute transcervical fracture of the left femoral neck with apex anterior angulation. No additional fracture seen. No dislocation. Osteopenia. Small left hip joint effusion. Ligaments Suboptimally assessed by CT. Muscles  and Tendons Mild atrophy of the gluteal muscles. Visualized tendons are grossly unremarkable. Soft tissues No hematoma.  Sigmoid diverticulosis. IMPRESSION: 1. Acute transcervical fracture of the left femoral neck. Electronically Signed   By: Titus Dubin M.D.   On: 01/06/2018 23:16   Chest Portable 1 View  Result Date: 01/06/2018 CLINICAL DATA:  84 y/o  F; dry cough and fatigue. EXAM: PORTABLE CHEST 1 VIEW COMPARISON:  03/10/2014 chest radiograph. FINDINGS: Stable cardiac silhouette given projection and technique. Aortic atherosclerosis with calcification. Elevated right hemidiaphragm with streaky right basilar opacity. Reticular and peripheral linear opacities at the left lung base. No acute osseous abnormality is evident. IMPRESSION: 1. Elevated right hemidiaphragm and streaky right basilar opacity probably representing atelectasis. 2. Mild interstitial pulmonary edema. 3. Aortic atherosclerosis. Electronically Signed   By: Kristine Garbe M.D.   On: 01/06/2018 18:30   Dg Hip Unilat W Or Wo Pelvis 2-3 Views Left  Result Date: 01/06/2018 CLINICAL DATA:  84 y/o  F; severe left hip pain  after fall. EXAM: DG HIP (WITH OR WITHOUT PELVIS) 2-3V LEFT COMPARISON:  None. FINDINGS: Acute fracture of the left proximal femur which appears to be a basicervical neck fracture, possibly extending into the greater trochanter. Coxa vara angulation. Hip joints are maintained. No pelvic fracture or diastasis. IMPRESSION: Acute left proximal femur fracture which appears to be a basicervical neck fracture, possibly extending into the greater trochanter. Coxa vara angulation. Electronically Signed   By: Kristine Garbe M.D.   On: 01/06/2018 15:26    Time Spent in minutes  30   Jani Gravel M.D on 01/08/2018 at 9:09 AM  Between 7am to 7pm - Pager - [phone]    After 7pm go to [email or web address] - password [facility]  [facility] -  Office  [phone]

## 2018-01-08 NOTE — Progress Notes (Signed)
Orthopedic Tech Progress Note Patient Details:  [name] [name] 1930/04/18 [id number]  Ortho Devices Ortho Device/Splint Location: Trapeze bar   Post Interventions Patient Tolerated: Unable to use device properly   [name] 01/08/2018, 6:34 AM

## 2018-01-08 NOTE — Progress Notes (Signed)
ANTICOAGULATION CONSULT NOTE - Initial Consult  Pharmacy Consult for Coumadin Indication: atrial fibrillation  Allergies  Allergen Reactions  . Lactose Intolerance (Gi) Nausea And Vomiting    severe stomach pain  . Penicillins Hives and Itching    "haven't used any since 1970's" "haven't used any since 1970's"  . Sulfa Antibiotics Hives and Itching    "haven't used any since 1970's"  . Ciprofloxacin Swelling  . Sulfasalazine Hives and Itching    "haven't used any since 1970's"    Patient Measurements: Height: 5' (152.4 cm) Weight: 139 lb (63 kg) IBW/kg (Calculated) : 45.5  Vital Signs: Temp: 97.8 F (36.6 C) (04/06 0511) Temp Source: Oral (04/06 0511) BP: 146/109 (04/06 0511) Pulse Rate: 94 (04/06 0511)  Labs: Recent Labs    01/06/18 1424  01/06/18 1541 01/07/18 1123 01/08/18 0443  HGB  --    < > 15.5* 14.6 11.9*  HCT  --   --  45.5 43.6 36.8  PLT  --   --  252 232 236  LABPROT  --   --  23.1* 15.8*  --   INR  --   --  2.06 1.27  --   CREATININE 0.82  --   --  0.85 0.96   < > = values in this interval not displayed.    Estimated Creatinine Clearance: 35.5 mL/min (by C-G formula based on SCr of 0.96 mg/dL).   Medical History: Past Medical History:  Diagnosis Date  . Atrial fibrillation (HCC)    RVR hx.  Chronic Coumadin  . C. difficile colitis 09/21/2013, 11/2013  . Carotid stenosis    a. Carotid US (10/2013):  bilat 1-39%; f/u 1 year  . Chronic lower back pain   . COPD (chronic obstructive pulmonary disease) ([place])   . Depression   . GERD (gastroesophageal reflux disease)    with HH  . Glaucoma   . HCAP (healthcare-associated pneumonia) 11/21/2011  . High cholesterol   . Hypertension   . Metabolic encephalopathy 01/09/4258  . Mitral regurgitation 03/11/2012  . Pancreatitis    ~2008, 11/2011 post ERCP  . Pneumonia 12/2011   "first time I know about"  . Sepsis ([place]) 11/27/2013  . SIRS (systemic inflammatory response syndrome) ([place]) 03/13/2012   a/w post  ERCP  pancreatitis.   . Type II diabetes mellitus ([place])     Assessment: CC/HPI: hip fx after fall. THA 4/5 PM.  PMH: Afib, DM, HTN, HLD, COPD, h/o cdiff 2015, carotid stenosis, chronic low back pain, COPD, depression, GERD, glaucoma, HLD, HTN, neuropathy  Anticoag: Afib. CHA2DS2-VASc 4. Baseline Hgb 15.5>11.9 post-op. INR 1.27 - 4/4: Vitamin K 5mg  IV x 1 - PTA warfarin 2.5mg  daily except 1.25mg  SunWed for afib. Admit INR 2.06  Goal of Therapy:  INR 2-3 Monitor platelets by anticoagulation protocol: Yes   Plan:  Lovenox 65mg  BID x 3 days? Coumadin 4mg  po x 1 tonight Daily INR  [name] [name], PharmD, [facility] Clinical Staff Pharmacist Pager [phone]  [name] [name] 01/08/2018,10:16 AM

## 2018-01-08 NOTE — Progress Notes (Addendum)
Orthopedic Trauma Service Progress Note   Patient ID: [name] [name] MRN: [id number] DOB/AGE: 1930/05/02 84 y.o.  Subjective: Patient pleasant and optimistic for PT. Not complaining of pain.    ROS As above. Objective:   VITALS:   Vitals:   01/07/18 1845 01/07/18 2022 01/08/18 0224 01/08/18 0511  BP:  (!) 125/56 122/82 (!) 146/109  Pulse:  (!) 117 80 94  Resp:      Temp: 97.7 F (36.5 C) 98.9 F (37.2 C)  97.8 F (36.6 C)  TempSrc:  Oral  Oral  SpO2:  98% 95% 97%  Weight:      Height:        Estimated body mass index is 27.15 kg/m as calculated from the following:   Height as of this encounter: 5' (1.524 m).   Weight as of this encounter: 63 kg (139 lb).   Intake/Output      04/05 0701 - 04/06 0700 04/06 0701 - 04/07 0700   P.O. 0 120   I.V. (mL/kg) 800 (12.7)    IV Piggyback 250    Total Intake(mL/kg) 1050 (16.7) 120 (1.9)   Urine (mL/kg/hr) 400 (0.3)    Stool 0    Blood 200    Total Output 600    Net +450 +120        Urine Occurrence 2 x 1 x   Stool Occurrence 1 x 1 x     LABS  Results for orders placed or performed during the hospital encounter of 01/06/18 (from the past 24 hour(s))  Protime-INR     Status: Abnormal   Collection Time: 01/07/18 11:23 AM  Result Value Ref Range   Prothrombin Time 15.8 (H) 11.4 - 15.2 seconds   INR 1.27   CBC     Status: Abnormal   Collection Time: 01/07/18 11:23 AM  Result Value Ref Range   WBC 13.3 (H) 4.0 - 10.5 K/uL   RBC 4.79 3.87 - 5.11 MIL/uL   Hemoglobin 14.6 12.0 - 15.0 g/dL   HCT 43.6 36.0 - 46.0 %   MCV 91.0 78.0 - 100.0 fL   MCH 30.5 26.0 - 34.0 pg   MCHC 33.5 30.0 - 36.0 g/dL   RDW 13.4 11.5 - 15.5 %   Platelets 232 150 - 400 K/uL  Basic metabolic panel     Status: Abnormal   Collection Time: 01/07/18 11:23 AM  Result Value Ref Range   Sodium 136 135 - 145 mmol/L   Potassium 3.7 3.5 - 5.1 mmol/L   Chloride 100 (L) 101 - 111 mmol/L   CO2 22 22 - 32 mmol/L   Glucose,  Bld 195 (H) 65 - 99 mg/dL   BUN 9 6 - 20 mg/dL   Creatinine, Ser 0.85 0.44 - 1.00 mg/dL   Calcium 8.9 8.9 - 10.3 mg/dL   GFR calc non Af Amer >60 >60 mL/min   GFR calc Af Amer >60 >60 mL/min   Anion gap 14 5 - 15  Hemoglobin A1c     Status: Abnormal   Collection Time: 01/07/18 11:23 AM  Result Value Ref Range   Hgb A1c MFr Bld 7.1 (H) 4.8 - 5.6 %   Mean Plasma Glucose 157.07 mg/dL  Glucose, capillary     Status: Abnormal   Collection Time: 01/07/18 12:27 PM  Result Value Ref Range   Glucose-Capillary 187 (H) 65 - 99 mg/dL  Glucose, capillary     Status: Abnormal   Collection Time: 01/07/18  5:55 PM  Result Value  Ref Range   Glucose-Capillary 202 (H) 65 - 99 mg/dL   Comment 1 Notify RN    Comment 2 Document in Chart   Glucose, capillary     Status: Abnormal   Collection Time: 01/07/18  8:26 PM  Result Value Ref Range   Glucose-Capillary 226 (H) 65 - 99 mg/dL   Comment 1 Document in Chart   Glucose, capillary     Status: Abnormal   Collection Time: 01/08/18  2:18 AM  Result Value Ref Range   Glucose-Capillary 223 (H) 65 - 99 mg/dL   Comment 1 Document in Chart   CBC     Status: Abnormal   Collection Time: 01/08/18  4:43 AM  Result Value Ref Range   WBC 11.6 (H) 4.0 - 10.5 K/uL   RBC 3.96 3.87 - 5.11 MIL/uL   Hemoglobin 11.9 (L) 12.0 - 15.0 g/dL   HCT 36.8 36.0 - 46.0 %   MCV 92.9 78.0 - 100.0 fL   MCH 30.1 26.0 - 34.0 pg   MCHC 32.3 30.0 - 36.0 g/dL   RDW 13.9 11.5 - 15.5 %   Platelets 236 150 - 400 K/uL  Basic metabolic panel     Status: Abnormal   Collection Time: 01/08/18  4:43 AM  Result Value Ref Range   Sodium 136 135 - 145 mmol/L   Potassium 3.7 3.5 - 5.1 mmol/L   Chloride 101 101 - 111 mmol/L   CO2 20 (L) 22 - 32 mmol/L   Glucose, Bld 225 (H) 65 - 99 mg/dL   BUN 19 6 - 20 mg/dL   Creatinine, Ser 0.96 0.44 - 1.00 mg/dL   Calcium 8.7 (L) 8.9 - 10.3 mg/dL   GFR calc non Af Amer 52 (L) >60 mL/min   GFR calc Af Amer >60 >60 mL/min   Anion gap 15 5 - 15   Glucose, capillary     Status: Abnormal   Collection Time: 01/08/18  8:57 AM  Result Value Ref Range   Glucose-Capillary 231 (H) 65 - 99 mg/dL   CBG (last 3)  Recent Labs    01/07/18 2026 01/08/18 0218 01/08/18 0857  GLUCAP 226* 223* 231*    PHYSICAL EXAM:   Gen: Alert, laying in bed. NAD. Ext:   Left Lower Extremity    Dressing in place, clean, dry, without blood or discharge   Minimal swelling   Sensation to foot grossly intact   EHL, FHL, and lesser toe extensors and flexors grossly intact  Extremity warm and well perfused GU: external urinary catheter showing dark orange urine  Assessment/Plan: 1 Day Post-Op   Principal Problem:   Hip fracture (HCC) Active Problems:   DM II (diabetes mellitus, type II), controlled ([place])   Atrial fibrillation    COPD (chronic obstructive pulmonary disease) ([place])   DM neuropathy, type II diabetes mellitus ([place])   Anti-infectives (From admission, onward)   Start     Dose/Rate Route Frequency Ordered Stop   01/07/18 1945  ceFAZolin (ANCEF) IVPB 2g/100 mL premix     2 g 200 mL/hr over 30 Minutes Intravenous Every 6 hours 01/07/18 1937 01/08/18 0238   01/07/18 1700  ceFAZolin (ANCEF) IVPB 2 g/50 mL premix     2 g 100 mL/hr over 30 Minutes Intravenous  Once 01/07/18 1647 01/07/18 1650   01/07/18 1544  vancomycin (VANCOCIN) powder  Status:  Discontinued       As needed 01/07/18 1544 01/07/18 1747    .  POD/HD#: 1  84  y/o female s/p Fall   - left displaced femoral neck fracture s/p left hip hemiarthroplasty by Dr. Celene Squibb  Posterior hip precautions  PT/OT  Dressing changes as needed starting tomorrow    - Pain management:  Tylenol PRN  Neurontin  Norco q6 PRN  - ABL anemia/Hemodynamics  Stable - continue to monitor  - Medical Issues  Per medicine service  hyperglycemia  - DVT/PE prophylaxis:  Lovenox bridge to coumadin  - ID:   periop abx completed  UTI - Rocephin  - Activity:  WBAT  Posterior hip  precautions  - FEN/GI prophylaxis/Foley/Lines:  Urine wick in place  - Dispo:  PT/OT  Social work for SNF placement    Tonny Branch, PA-S 01/08/2018, 10:33 AM   Seen and evaluated, agree with PA-S Hunt  U/a shows probable uti and Urine culture confirms E.colli >10^5 Will start Rocephin now. Definitive abx plan per medicine  CBGs are terrible. Consistent sugars >200 mg/dL increases risks of complications such as infection  Adjustments per medicine  HTN: 146/109, unclear if any one was paged about this. Antihypertensives are ordered   Recheck vitals    PT/OT evals Likely will need snf   Jari Pigg, PA-C [facility] [phone] (C) [phone] (O) 01/08/2018 11:02 AM

## 2018-01-08 NOTE — Anesthesia Postprocedure Evaluation (Signed)
Anesthesia Post Note  Patient: Caroll Murray-Rush  Procedure(s) Performed: ARTHROPLASTY BIPOLAR HIP (HEMIARTHROPLASTY) (Left )     Patient location during evaluation: PACU Anesthesia Type: General Level of consciousness: awake and alert and combative Pain management: pain level controlled Vital Signs Assessment: post-procedure vital signs reviewed and stable Respiratory status: spontaneous breathing, nonlabored ventilation and respiratory function stable Cardiovascular status: blood pressure returned to baseline and stable Postop Assessment: no apparent nausea or vomiting Anesthetic complications: no    Last Vitals:  Vitals:   01/08/18 0224 01/08/18 0511  BP: 122/82 (!) 146/109  Pulse: 80 94  Resp:    Temp:  36.6 C  SpO2: 95% 97%    Last Pain:  Vitals:   01/08/18 0511  TempSrc: Oral  PainSc:                  Catalina Gravel

## 2018-01-08 NOTE — Discharge Instructions (Addendum)
Information on my medicine - Coumadin®   (Warfarin) ° °Why was Coumadin prescribed for you? °Coumadin was prescribed for you because you have a blood clot or a medical condition that can cause an increased risk of forming blood clots. Blood clots can cause serious health problems by blocking the flow of blood to the heart, lung, or brain. Coumadin can prevent harmful blood clots from forming. °As a reminder your indication for Coumadin is:   Stroke Prevention Because Of Atrial Fibrillation ° °What test will check on my response to Coumadin? °While on Coumadin (warfarin) you will need to have an INR test regularly to ensure that your dose is keeping you in the desired range. The INR (international normalized ratio) number is calculated from the result of the laboratory test called prothrombin time (PT). ° °If an INR APPOINTMENT HAS NOT ALREADY BEEN MADE FOR YOU please schedule an appointment to have this lab work done by your health care provider within 7 days. °Your INR goal is usually a number between:  2 to 3 or your provider may give you a more narrow range like 2-2.5.  Ask your health care provider during an office visit what your goal INR is. ° °What  do you need to  know  About  COUMADIN? °Take Coumadin (warfarin) exactly as prescribed by your healthcare provider about the same time each day.  DO NOT stop taking without talking to the doctor who prescribed the medication.  Stopping without other blood clot prevention medication to take the place of Coumadin may increase your risk of developing a new clot or stroke.  Get refills before you run out. ° °What do you do if you miss a dose? °If you miss a dose, take it as soon as you remember on the same day then continue your regularly scheduled regimen the next day.  Do not take two doses of Coumadin at the same time. ° °Important Safety Information °A possible side effect of Coumadin (Warfarin) is an increased risk of bleeding. You should call your healthcare  provider right away if you experience any of the following: °? Bleeding from an injury or your nose that does not stop. °? Unusual colored urine (red or dark brown) or unusual colored stools (red or black). °? Unusual bruising for unknown reasons. °? A serious fall or if you hit your head (even if there is no bleeding). ° °Some foods or medicines interact with Coumadin® (warfarin) and might alter your response to warfarin. To help avoid this: °? Eat a balanced diet, maintaining a consistent amount of Vitamin K. °? Notify your provider about major diet changes you plan to make. °? Avoid alcohol or limit your intake to 1 drink for women and 2 drinks for men per day. °(1 drink is 5 oz. wine, 12 oz. beer, or 1.5 oz. liquor.) ° °Make sure that ANY health care provider who prescribes medication for you knows that you are taking Coumadin (warfarin).  Also make sure the healthcare provider who is monitoring your Coumadin knows when you have started a new medication including herbals and non-prescription products. ° °Coumadin® (Warfarin)  Major Drug Interactions  °Increased Warfarin Effect Decreased Warfarin Effect  °Alcohol (large quantities) °Antibiotics (esp. Septra/Bactrim, Flagyl, Cipro) °Amiodarone (Cordarone) °Aspirin (ASA) °Cimetidine (Tagamet) °Megestrol (Megace) °NSAIDs (ibuprofen, naproxen, etc.) °Piroxicam (Feldene) °Propafenone (Rythmol SR) °Propranolol (Inderal) °Isoniazid (INH) °Posaconazole (Noxafil) Barbiturates (Phenobarbital) °Carbamazepine (Tegretol) °Chlordiazepoxide (Librium) °Cholestyramine (Questran) °Griseofulvin °Oral Contraceptives °Rifampin °Sucralfate (Carafate) °Vitamin K  ° °Coumadin® (Warfarin) Major Herbal   Interactions  Increased Warfarin Effect Decreased Warfarin Effect  Garlic Ginseng Ginkgo biloba Coenzyme Q10 Green tea St. Johns wort    Coumadin (Warfarin) FOOD Interactions  Eat a consistent number of servings per week of foods HIGH in Vitamin K (1 serving =  cup)  Collards  (cooked, or boiled & drained) Kale (cooked, or boiled & drained) Mustard greens (cooked, or boiled & drained) Parsley *serving size only =  cup Spinach (cooked, or boiled & drained) Swiss chard (cooked, or boiled & drained) Turnip greens (cooked, or boiled & drained)  Eat a consistent number of servings per week of foods MEDIUM-HIGH in Vitamin K (1 serving = 1 cup)  Asparagus (cooked, or boiled & drained) Broccoli (cooked, boiled & drained, or raw & chopped) Brussel sprouts (cooked, or boiled & drained) *serving size only =  cup Lettuce, raw (green leaf, endive, romaine) Spinach, raw Turnip greens, raw & chopped   These websites have more information on Coumadin (warfarin):  FailFactory.se; VeganReport.com.au;   Ophelia Charter MD, MPH [facility] [address] [phone] (fax)  POST-OPERATIVE INSTRUCTIONS  WOUND CARE ? With your diarrheal illness we have had to change your dressing while in the hospital.  Please keep watch on this dressing.  IF no drainage ok to leave on until followup.  If draining or staining on dressing will need to be checked and changed every other day.  Keep incision covered while still having stomach issues. ? KEEP THE INCISIONS CLEAN AND DRY. ? You may shower on Post-Op Day #2. Gently pat the area dry.  The dressing is waterproof. Do not soak the knee in water. Do not go swimming in the pool or ocean until 4 weeks after surgery or when otherwise instructed.  EXERCISES ? You may bear weight as tolerated on your operative extremity. ? When in bed and while sleeping it is imperative you wear your knee immobilizer until we instruct you otherwise while in clinic. ? It is essential that you follow your posterior hip precautions.  o Do not sit in a low chair. (No hip flexion >90 degrees) o Do not cross your legs.  (No Adduction past neutral) o Keep your toes pointed straight ahead at all  times (No internal rotation or excessive external rotation) ? Therapy may be performed by an outpatient therapist or at a rehab facility  [place] ? A multi-modal approach will be used to treat your pain.  Oxycodone - This is a strong narcotic, to be used only on an as needed basis for pain.  Acetaminophen - A non-narcotic pain medicine.  Use 1000mg  three times a day for the first 14 days after surgery.  Restart home coumadin for DVT prophylaxis and baseline use ? If you have any adverse effects with the medications, please call our office.  FOLLOW-UP ? If you develop a Fever (>101.5), Redness or Drainage from the surgical incision site, please call our office to arrange for an evaluation. ? Please call the office to schedule a follow-up appointment for your suture removal, 10-14 days post-operatively. IF YOU HAVE ANY QUESTIONS, PLEASE FEEL FREE TO CALL OUR OFFICE.

## 2018-01-09 ENCOUNTER — Inpatient Hospital Stay (HOSPITAL_COMMUNITY): Payer: Medicare HMO

## 2018-01-09 DIAGNOSIS — S72002A Fracture of unspecified part of neck of left femur, initial encounter for closed fracture: Secondary | ICD-10-CM

## 2018-01-09 DIAGNOSIS — E1140 Type 2 diabetes mellitus with diabetic neuropathy, unspecified: Secondary | ICD-10-CM

## 2018-01-09 DIAGNOSIS — Z794 Long term (current) use of insulin: Secondary | ICD-10-CM

## 2018-01-09 DIAGNOSIS — W19XXXA Unspecified fall, initial encounter: Secondary | ICD-10-CM

## 2018-01-09 DIAGNOSIS — I4891 Unspecified atrial fibrillation: Secondary | ICD-10-CM

## 2018-01-09 DIAGNOSIS — S72142A Displaced intertrochanteric fracture of left femur, initial encounter for closed fracture: Secondary | ICD-10-CM

## 2018-01-09 DIAGNOSIS — J449 Chronic obstructive pulmonary disease, unspecified: Secondary | ICD-10-CM

## 2018-01-09 DIAGNOSIS — N3000 Acute cystitis without hematuria: Secondary | ICD-10-CM

## 2018-01-09 LAB — COMPREHENSIVE METABOLIC PANEL
ALBUMIN: 3 g/dL — AB (ref 3.5–5.0)
ALT: 5 U/L — ABNORMAL LOW (ref 14–54)
AST: 40 U/L (ref 15–41)
Alkaline Phosphatase: 60 U/L (ref 38–126)
Anion gap: 11 (ref 5–15)
BUN: 23 mg/dL — AB (ref 6–20)
CHLORIDE: 100 mmol/L — AB (ref 101–111)
CO2: 23 mmol/L (ref 22–32)
Calcium: 8.7 mg/dL — ABNORMAL LOW (ref 8.9–10.3)
Creatinine, Ser: 1.04 mg/dL — ABNORMAL HIGH (ref 0.44–1.00)
GFR calc Af Amer: 55 mL/min — ABNORMAL LOW (ref >60)
GFR calc non Af Amer: 48 mL/min — ABNORMAL LOW (ref >60)
GLUCOSE: 219 mg/dL — AB (ref 65–99)
POTASSIUM: 4 mmol/L (ref 3.5–5.1)
Sodium: 134 mmol/L — ABNORMAL LOW (ref 135–145)
Total Bilirubin: 1.2 mg/dL (ref 0.3–1.2)
Total Protein: 6.1 g/dL — ABNORMAL LOW (ref 6.5–8.1)

## 2018-01-09 LAB — GLUCOSE, CAPILLARY
GLUCOSE-CAPILLARY: 209 mg/dL — AB (ref 65–99)
GLUCOSE-CAPILLARY: 196 mg/dL — AB (ref 65–99)
GLUCOSE-CAPILLARY: 197 mg/dL — AB (ref 65–99)
Glucose-Capillary: 151 mg/dL — ABNORMAL HIGH (ref 65–99)

## 2018-01-09 LAB — PROTIME-INR
INR: 1.22
Prothrombin Time: 15.3 seconds — ABNORMAL HIGH (ref 11.4–15.2)

## 2018-01-09 LAB — CBC
HEMATOCRIT: 32.9 % — AB (ref 36.0–46.0)
Hemoglobin: 10.8 g/dL — ABNORMAL LOW (ref 12.0–15.0)
MCH: 30.3 pg (ref 26.0–34.0)
MCHC: 32.8 g/dL (ref 30.0–36.0)
MCV: 92.4 fL (ref 78.0–100.0)
PLATELETS: 221 10*3/uL (ref 150–400)
RBC: 3.56 MIL/uL — AB (ref 3.87–5.11)
RDW: 13.6 % (ref 11.5–15.5)
WBC: 15 10*3/uL — AB (ref 4.0–10.5)

## 2018-01-09 MED ORDER — HALOPERIDOL LACTATE 5 MG/ML IJ SOLN
2.0000 mg | Freq: Once | INTRAMUSCULAR | Status: AC
  Administered 2018-01-10: 2 mg via INTRAVENOUS
  Filled 2018-01-09: qty 1

## 2018-01-09 MED ORDER — OXYCODONE HCL 5 MG PO TABS
5.0000 mg | ORAL_TABLET | ORAL | Status: DC | PRN
  Administered 2018-01-10 – 2018-01-11 (×3): 5 mg via ORAL
  Filled 2018-01-09 (×3): qty 1

## 2018-01-09 MED ORDER — ACETAMINOPHEN 325 MG PO TABS
650.0000 mg | ORAL_TABLET | Freq: Four times a day (QID) | ORAL | Status: DC
  Administered 2018-01-09 – 2018-01-11 (×7): 650 mg via ORAL
  Filled 2018-01-09 (×8): qty 2

## 2018-01-09 MED ORDER — WARFARIN SODIUM 5 MG PO TABS
5.0000 mg | ORAL_TABLET | Freq: Once | ORAL | Status: AC
  Administered 2018-01-09: 5 mg via ORAL
  Filled 2018-01-09: qty 1

## 2018-01-09 NOTE — Plan of Care (Signed)
  Problem: Education: Goal: Verbalization of understanding the information provided (i.e., activity precautions, restrictions, etc) will improve Outcome: Progressing   Problem: Activity: Goal: Ability to ambulate and perform ADLs will improve Outcome: Progressing   Problem: Clinical Measurements: Goal: Postoperative complications will be avoided or minimized Outcome: Progressing   Problem: Self-Concept: Goal: Ability to maintain and perform role responsibilities to the fullest extent possible will improve Outcome: Progressing   Problem: Pain Management: Goal: Pain level will decrease Outcome: Progressing   Problem: Education: Goal: Knowledge of General Education information will improve Outcome: Progressing   Problem: Health Behavior/Discharge Planning: Goal: Ability to manage health-related needs will improve Outcome: Progressing   Problem: Clinical Measurements: Goal: Will remain free from infection Outcome: Progressing Goal: Respiratory complications will improve Outcome: Progressing Goal: Cardiovascular complication will be avoided Outcome: Progressing   Problem: Activity: Goal: Risk for activity intolerance will decrease Outcome: Progressing   Problem: Nutrition: Goal: Adequate nutrition will be maintained Outcome: Progressing   Problem: Coping: Goal: Level of anxiety will decrease Outcome: Progressing   Problem: Elimination: Goal: Will not experience complications related to bowel motility Outcome: Progressing Goal: Will not experience complications related to urinary retention Outcome: Progressing   Problem: Safety: Goal: Ability to remain free from injury will improve Outcome: Progressing   Problem: Skin Integrity: Goal: Risk for impaired skin integrity will decrease Outcome: Progressing

## 2018-01-09 NOTE — NC FL2 (Signed)
[place] MEDICAID FL2 LEVEL OF CARE SCREENING TOOL     IDENTIFICATION  Patient Name: [name] [name] Birthdate: Jul 20, 1930 Sex: female Admission Date (Current Location): 01/06/2018  [facility] and [place] Number:  Herbalist and Address:  [facility], [place] [address] [place], [place] [address]      Provider Number: [id number]  Attending Physician Name and Address:  Cristal Ford, DO  Relative Name and Phone Number:       Current Level of Care: Hospital Recommended Level of Care: [place] Prior Approval Number:    Date Approved/Denied:   PASRR Number: [id number]  Discharge Plan: SNF    Current Diagnoses: Patient Active Problem List   Diagnosis Date Noted  . DM neuropathy, type II diabetes mellitus ([place]) 01/07/2018  . Hip fracture ([place]) 01/06/2018  . Advance care planning   . Goals of care, counseling/discussion   . COPD (chronic obstructive pulmonary disease) ([place]) 10/28/2017  . RLQ abdominal pain 04/24/2014  . Nausea alone 04/11/2014  . Abdominal pain 08/05/2012    Class: Acute  . Irritable bowel syndrome 04/12/2012  . Chronic diarrhea 04/12/2012  . Encounter for long-term (current) use of anticoagulants 03/17/2012  . Depression 03/13/2012  . Anxiety 03/13/2012  . Mitral regurgitation 03/11/2012  . Chronic epigastric pain 01/25/2012  . Atrial fibrillation    . Hypokalemia 11/26/2011  . UTI (urinary tract infection) 11/13/2011  . DM II (diabetes mellitus, type II), controlled ([place]) 11/13/2011  . HTN (hypertension) 11/13/2011  . Hypercholesteremia 11/13/2011  . Glaucoma (increased eye pressure) 11/13/2011    Orientation RESPIRATION BLADDER Height & Weight     Self, Situation, Place, Time  Normal Incontinent Weight: 139 lb (63 kg) Height:  5' (152.4 cm)  BEHAVIORAL SYMPTOMS/MOOD NEUROLOGICAL BOWEL NUTRITION STATUS      Continent Diet(Regular diet, thin liquids)  AMBULATORY STATUS COMMUNICATION OF NEEDS  Skin   Limited Assist Verbally Surgical wounds(Closed incision left hip, silicone dressing)                       Personal Care Assistance Level of Assistance  Bathing, Feeding, Dressing Bathing Assistance: Limited assistance Feeding assistance: Independent Dressing Assistance: Limited assistance     Functional Limitations Info  Sight, Speech, Hearing Sight Info: Adequate Hearing Info: Adequate Speech Info: Adequate    SPECIAL CARE FACTORS FREQUENCY  PT (By licensed PT), OT (By licensed OT)     PT Frequency: 5x OT Frequency: 5x            Contractures Contractures Info: Not present    Additional Factors Info  Code Status, Allergies Code Status Info: Full Code Allergies Info: Lactose Intolerance (Gi), Penicillins, Sulfa Antibiotics, Ciprofloxacin, Sulfasalazine           Current Medications (01/09/2018):  This is the current hospital active medication list Current Facility-Administered Medications  Medication Dose Route Frequency Provider Last Rate Last Dose  . 0.9 %  sodium chloride infusion   Intravenous Continuous Samuella Cota, MD 50 mL/hr at 01/07/18 2137    . acetaminophen (TYLENOL) tablet 650 mg  650 mg Oral Q6H Ainsley Spinner, PA-C      . bisacodyl (DULCOLAX) EC tablet 5 mg  5 mg Oral Daily PRN Samuella Cota, MD      . cefTRIAXone (ROCEPHIN) 1 g in sodium chloride 0.9 % 100 mL IVPB  1 g Intravenous Q24H Karren Cobble, [place]   Stopped at 01/09/18 1225  . dicyclomine (BENTYL) tablet  10 mg  10 mg Oral Q6H PRN Samuella Cota, MD      . diltiazem (CARDIZEM CD) 24 hr capsule 120 mg  120 mg Oral Daily Samuella Cota, MD   120 mg at 01/09/18 0941  . docusate sodium (COLACE) capsule 100 mg  100 mg Oral BID Ophelia Charter T, MD   100 mg at 01/08/18 2219  . enoxaparin (LOVENOX) injection 65 mg  1 mg/kg Subcutaneous Q12H Jani Gravel, MD   65 mg at 01/09/18 0942  . famotidine (PEPCID) tablet 20 mg  20 mg Oral BID Samuella Cota, MD   20 mg at  01/09/18 1962  . gabapentin (NEURONTIN) capsule 100 mg  100 mg Oral TID Samuella Cota, MD   100 mg at 01/09/18 2297  . insulin aspart (novoLOG) injection 0-9 Units  0-9 Units Subcutaneous Q6H Samuella Cota, MD   3 Units at 01/09/18 [phone]  . insulin glargine (LANTUS) injection 10 Units  10 Units Subcutaneous Q2200 Samuella Cota, MD   10 Units at 01/08/18 2218  . ipratropium-albuterol (DUONEB) 0.5-2.5 (3) MG/3ML nebulizer solution 3 mL  3 mL Nebulization Q4H PRN Samuella Cota, MD      . latanoprost (XALATAN) 0.005 % ophthalmic solution 1 drop  1 drop Both Eyes QHS Samuella Cota, MD   1 drop at 01/08/18 2220  . loperamide (IMODIUM) capsule 2 mg  2 mg Oral PRN Samuella Cota, MD   2 mg at 01/08/18 2249  . LORazepam (ATIVAN) tablet 1 mg  1 mg Oral Q8H PRN Samuella Cota, MD      . magnesium citrate solution 1 Bottle  1 Bottle Oral Daily PRN Samuella Cota, MD      . menthol-cetylpyridinium (CEPACOL) lozenge 3 mg  1 lozenge Oral PRN Hiram Gash, MD       Or  . phenol (CHLORASEPTIC) mouth spray 1 spray  1 spray Mouth/Throat PRN Hiram Gash, MD      . methylphenidate (RITALIN) tablet 5 mg  5 mg Oral Daily Samuella Cota, MD   5 mg at 01/09/18 0941  . metoCLOPramide (REGLAN) tablet 5-10 mg  5-10 mg Oral Q8H PRN Hiram Gash, MD       Or  . metoCLOPramide (REGLAN) injection 5-10 mg  5-10 mg Intravenous Q8H PRN Ophelia Charter T, MD      . metoprolol tartrate (LOPRESSOR) tablet 25 mg  25 mg Oral BID Samuella Cota, MD   25 mg at 01/09/18 0942  . mirtazapine (REMERON) tablet 7.5 mg  7.5 mg Oral QHS Samuella Cota, MD   7.5 mg at 01/08/18 2219  . mometasone-formoterol (DULERA) 200-5 MCG/ACT inhaler 2 puff  2 puff Inhalation BID Samuella Cota, MD   2 puff at 01/09/18 0806  . morphine 2 MG/ML injection 0.5 mg  0.5 mg Intravenous Q2H PRN Samuella Cota, MD   0.5 mg at 01/08/18 2234  . multivitamin with minerals tablet 1 tablet  1 tablet Oral Daily  Samuella Cota, MD   1 tablet at 01/09/18 0941  . ondansetron (ZOFRAN) tablet 4 mg  4 mg Oral Q6H PRN Hiram Gash, MD       Or  . ondansetron [facility]) injection 4 mg  4 mg Intravenous Q6H PRN Hiram Gash, MD   4 mg at 01/09/18 0940  . oxybutynin (DITROPAN) tablet 5 mg  5 mg Oral QHS Samuella Cota, MD  5 mg at 01/08/18 2220  . oxyCODONE (Oxy IR/ROXICODONE) immediate release tablet 5 mg  5 mg Oral Q4H PRN Ainsley Spinner, PA-C      . pantoprazole (PROTONIX) EC tablet 80 mg  80 mg Oral Daily Samuella Cota, MD   80 mg at 01/09/18 6701  . polyethylene glycol (MIRALAX / GLYCOLAX) packet 17 g  17 g Oral Daily PRN Samuella Cota, MD      . promethazine (PHENERGAN) tablet 25 mg  25 mg Oral TID PRN Samuella Cota, MD   25 mg at 01/07/18 0934  . sucralfate (CARAFATE) tablet 1 g  1 g Oral BID Samuella Cota, MD   1 g at 01/09/18 0941  . timolol (TIMOPTIC) 0.5 % ophthalmic solution 1 drop  1 drop Both Eyes Daily Samuella Cota, MD   1 drop at 01/09/18 0943  . warfarin (COUMADIN) tablet 5 mg  5 mg Oral ONCE-1800 [place], [place]      . Warfarin - Pharmacist Dosing Inpatient   Does not apply q1800 Karren Cobble [facility]         Discharge Medications: Please see discharge summary for a list of discharge medications.  Relevant Imaging Results:  Relevant Lab Results:   Additional Information SSN: [id number]  Eileen Stanford, LCSW

## 2018-01-09 NOTE — Progress Notes (Addendum)
Orthopedic Trauma Service Progress Note Weekend Coverage   Patient ID: [name] [name] MRN: [id number] DOB/AGE: Jan 08, 1930 84 y.o.  Subjective:  Doing ok but complaining of moderate pain  Pt has not had any pain meds since 2230 yesterday  Mobilized with PT yesterday  States she is having some diarrhea   Plan for SNF   Review of Systems  Constitutional: Negative for chills and fever.  Respiratory: Negative for shortness of breath.   Cardiovascular: Negative for chest pain and palpitations.  Gastrointestinal: Positive for diarrhea. Negative for abdominal pain, nausea and vomiting.  Neurological: Negative for tingling and headaches.    Objective:   VITALS:   Vitals:   01/08/18 2020 01/08/18 2209 01/09/18 0612 01/09/18 0806  BP: 132/69  (!) 111/57   Pulse: 92  70   Resp: 18  18   Temp: 98 F (36.7 C)  97.7 F (36.5 C)   TempSrc: Oral  Oral   SpO2: 94% 94% 94% 96%  Weight:      Height:        Estimated body mass index is 27.15 kg/m as calculated from the following:   Height as of this encounter: 5' (1.524 m).   Weight as of this encounter: 63 kg (139 lb).   Intake/Output      04/06 0701 - 04/07 0700 04/07 0701 - 04/08 0700   P.O. 360 120   I.V. (mL/kg) 1650 (26.2)    IV Piggyback 100    Total Intake(mL/kg) 2110 (33.5) 120 (1.9)   Urine (mL/kg/hr) 800 (0.5)    Stool     Blood     Total Output 800    Net +1310 +120        Urine Occurrence 2 x 2 x   Stool Occurrence 2 x 1 x     LABS  Results for orders placed or performed during the hospital encounter of 01/06/18 (from the past 24 hour(s))  Glucose, capillary     Status: Abnormal   Collection Time: 01/08/18  1:56 PM  Result Value Ref Range   Glucose-Capillary 281 (H) 65 - 99 mg/dL  Glucose, capillary     Status: Abnormal   Collection Time: 01/08/18  8:22 PM  Result Value Ref Range   Glucose-Capillary 231 (H) 65 - 99 mg/dL  Glucose, capillary     Status: Abnormal    Collection Time: 01/09/18  2:18 AM  Result Value Ref Range   Glucose-Capillary 151 (H) 65 - 99 mg/dL  CBC     Status: Abnormal   Collection Time: 01/09/18  9:03 AM  Result Value Ref Range   WBC 15.0 (H) 4.0 - 10.5 K/uL   RBC 3.56 (L) 3.87 - 5.11 MIL/uL   Hemoglobin 10.8 (L) 12.0 - 15.0 g/dL   HCT 32.9 (L) 36.0 - 46.0 %   MCV 92.4 78.0 - 100.0 fL   MCH 30.3 26.0 - 34.0 pg   MCHC 32.8 30.0 - 36.0 g/dL   RDW 13.6 11.5 - 15.5 %   Platelets 221 150 - 400 K/uL  Comprehensive metabolic panel     Status: Abnormal   Collection Time: 01/09/18  9:03 AM  Result Value Ref Range   Sodium 134 (L) 135 - 145 mmol/L   Potassium 4.0 3.5 - 5.1 mmol/L   Chloride 100 (L) 101 - 111 mmol/L   CO2 23 22 - 32 mmol/L   Glucose, Bld 219 (H) 65 - 99 mg/dL   BUN 23 (H) 6 - 20 mg/dL  Creatinine, Ser 1.04 (H) 0.44 - 1.00 mg/dL   Calcium 8.7 (L) 8.9 - 10.3 mg/dL   Total Protein 6.1 (L) 6.5 - 8.1 g/dL   Albumin 3.0 (L) 3.5 - 5.0 g/dL   AST 40 15 - 41 U/L   ALT 5 (L) 14 - 54 U/L   Alkaline Phosphatase 60 38 - 126 U/L   Total Bilirubin 1.2 0.3 - 1.2 mg/dL   GFR calc non Af Amer 48 (L) >60 mL/min   GFR calc Af Amer 55 (L) >60 mL/min   Anion gap 11 5 - 15  Protime-INR     Status: Abnormal   Collection Time: 01/09/18  9:03 AM  Result Value Ref Range   Prothrombin Time 15.3 (H) 11.4 - 15.2 seconds   INR 1.22   Glucose, capillary     Status: Abnormal   Collection Time: 01/09/18  9:39 AM  Result Value Ref Range   Glucose-Capillary 209 (H) 65 - 99 mg/dL   Results for [name], [name] (MRN [id number]) as of 01/09/2018 12:38  Ref. Range 01/06/2018 19:24  URINALYSIS, ROUTINE W REFLEX MICROSCOPIC Unknown Rpt (A)  Appearance Latest Ref Range: CLEAR  HAZY (A)  Bilirubin Urine Latest Ref Range: NEGATIVE  NEGATIVE  Color, Urine Latest Ref Range: YELLOW  YELLOW  Glucose Latest Ref Range: NEGATIVE mg/dL NEGATIVE  Hgb urine dipstick Latest Ref Range: NEGATIVE  NEGATIVE  Ketones, ur Latest Ref Range: NEGATIVE mg/dL  NEGATIVE  Leukocytes, UA Latest Ref Range: NEGATIVE  MODERATE (A)  Nitrite Latest Ref Range: NEGATIVE  NEGATIVE  pH Latest Ref Range: 5.0 - 8.0  7.0  Protein Latest Ref Range: NEGATIVE mg/dL 100 (A)  Specific Gravity, Urine Latest Ref Range: 1.005 - 1.030  1.008  Bacteria, UA Latest Ref Range: NONE SEEN  RARE (A)  RBC / HPF Latest Ref Range: 0 - 5 RBC/hpf 0-5  Squamous Epithelial / LPF Latest Ref Range: NONE SEEN  0-5 (A)  WBC, UA Latest Ref Range: 0 - 5 WBC/hpf 6-30   Status:  Final result   Visible to patient:  No (Not Released) Next appt:  03/01/2018 at 02:15 PM in Podiatry Trula Slade, DPM)  Specimen Information: Urine, Clean Catch      Component 3d ago  Specimen Description URINE, CLEAN CATCH  Performed at [facility], [place] [address], [place], [place] [id number]     Special Requests NONE  Performed at [facility], [place] [address], [place], [place] [address]     Culture >=100,000 COLONIES/mL ESCHERICHIA COLIAbnormal    Report Status 01/08/2018 FINAL   Organism ID, Bacteria ESCHERICHIA COLIAbnormal    Resulting Agency [facility]  Susceptibility    Escherichia coli    MIC    AMPICILLIN 8 SENSITIVE  Sensitive    AMPICILLIN/SULBACTAM <=2 SENSITIVE  Sensitive    CEFAZOLIN <=4 SENSITIVE  Sensitive    CEFTRIAXONE <=1 SENSITIVE  Sensitive    CIPROFLOXACIN <=0.25 SENS... Sensitive    Extended ESBL NEGATIVE  Sensitive    GENTAMICIN <=1 SENSITIVE  Sensitive    IMIPENEM <=0.25 SENS... Sensitive    NITROFURANTOIN <=16 SENSIT... Sensitive    PIP/TAZO <=4 SENSITIVE  Sensitive    TRIMETH/SULFA <=20 SENSIT... Sensitive         Susceptibility Comments   Escherichia coli  >=100,000 COLONIES/mL ESCHERICHIA COLI      Specimen Collected: 01/06/18 19:24 Last Resulted: 01/08/18 09:25         PHYSICAL EXAM:    Gen: resting  comfortably in bed, NAD Lungs: breathing unlabored  Cardiac: regular  Ext:       Left Lower Extremity    Dressing removed, some of steri-strips came off with dressing  Wound stable  Moderate ecchymosis  She appears to also have a hematoma around the distal aspect of the wound   No active drainage from incision   Distal motor an sensory functions intact  Ext warm    Swelling controlled  + DP pulse  Assessment/Plan: 2 Days Post-Op   Principal Problem:   Hip fracture ([place]) Active Problems:   UTI (urinary tract infection)   DM II (diabetes mellitus, type II), controlled ([place])   Atrial fibrillation    COPD (chronic obstructive pulmonary disease) ([place])   DM neuropathy, type II diabetes mellitus ([place])   Anti-infectives (From admission, onward)   Start     Dose/Rate Route Frequency Ordered Stop   01/08/18 1200  cefTRIAXone (ROCEPHIN) 1 g in sodium chloride 0.9 % 100 mL IVPB     1 g 200 mL/hr over 30 Minutes Intravenous Every 24 hours 01/08/18 1122     01/07/18 1945  ceFAZolin (ANCEF) IVPB 2g/100 mL premix     2 g 200 mL/hr over 30 Minutes Intravenous Every 6 hours 01/07/18 1937 01/08/18 0238   01/07/18 1700  ceFAZolin (ANCEF) IVPB 2 g/50 mL premix     2 g 100 mL/hr over 30 Minutes Intravenous  Once 01/07/18 1647 01/07/18 1650   01/07/18 1544  vancomycin (VANCOCIN) powder  Status:  Discontinued       As needed 01/07/18 1544 01/07/18 1747    .  POD/HD#: 2   84 y/o female s/p Fall    - left displaced femoral neck fracture s/p left hip hemiarthroplasty by Dr. Celene Squibb             Posterior hip precautions             PT/OT             Dressing changed today   Dressing changes as needed  Will need SNF at dc    Monitor hematoma L lateral thigh               - Pain management:             change tylenol to scheduled    Having some basal pain control might facilitate therapy   Oxy IR as needed   Dc morphine after today    - ABL anemia/Hemodynamics             Stable - continue to monitor   CBC in am   - Medical Issues             Per medicine service              hyperglycemia   Need better blood sugar control    >200 increases risk of complications such as infection    - DVT/PE prophylaxis:             Lovenox bridge to coumadin   INR 1.22 today   - ID:              periop abx completed             UTI - Rocephin day 2/5   - Activity:             WBAT  Posterior hip precautions   - FEN/GI prophylaxis/Foley/Lines:             dc urine wick  Now pt is post op, transferring to and from [facility] or ambulating to the bathroom are additional therapy sessions     - Dispo:             PT/OT             Social work for SNF placement   Suspect she will be ready on Tuesday for [place], PA-C [facility] [phone] ([phone] Levi Aland (C) 01/09/2018, 12:36 PM

## 2018-01-09 NOTE — Progress Notes (Signed)
[facility] informed that patient is showing signs of delirium. Patient was given 1mg  of Ativan po because she was having anxiety about a "storm coming". Arthor Captain LPN

## 2018-01-09 NOTE — Clinical Social Work Note (Signed)
Clinical Social Work Assessment  Patient Details  Name: [name] [name] MRN: [id number] Date of Birth: 02-02-1930  Date of referral:  01/09/18               Reason for consult:  Facility Placement                Permission sought to share information with:  Family Supports Permission granted to share information::     Name::     Scientist, clinical (histocompatibility and immunogenetics)::  [facility]  Relationship::  Spouse  Contact Information:     Housing/Transportation Living arrangements for the past 2 months:  Single Family Home Source of Information:  Patient, Spouse Patient Interpreter Needed:    Criminal Activity/Legal Involvement Pertinent to Current Situation/Hospitalization:  No - Comment as needed Significant Relationships:  Spouse Lives with:  Spouse Do you feel safe going back to the place where you live?  No Need for family participation in patient care:  No (Coment)  Care giving concerns:  Pt is alert and oriented. Pt lives with spouse. No additional caregiver noted at this time.   Social Worker assessment / plan:  CSW spoke with pt. Pt is aware and agreeable to SNF at d/c. Pt has been to [facility] and prefers to go there. Pt has no alternative choices at this time. CSW will follow up with facility on day of d/c.  Employment status:  Retired Nurse, adult PT Recommendations:  [place] / Referral to community resources:  [place]  Patient/Family's Response to care:  Pt verbalized understanding of CSW role and expressed appreciation for support. Pt denies any concern regarding pt care at this time.   Patient/Family's Understanding of and Emotional Response to Diagnosis, Current Treatment, and Prognosis:  Pt understanding and realistic regarding physical limitations. Pt understands the need for SNF placement at d/c. Pt agreeable to SNF placement at d/c, at this time. Pt's responses emotionally appropriate during conversation with CSW. Pt  denies any concern regarding treatment plan at this time. CSW will continue to provide support and facilitate d/c needs.   Emotional Assessment Appearance:  Appears stated age Attitude/Demeanor/Rapport:  (Patient was appropriate) Affect (typically observed):  Accepting, Appropriate, Calm Orientation:  Oriented to Situation, Oriented to Place, Oriented to Self, Oriented to  Time Alcohol / Substance use:  Not Applicable Psych involvement (Current and /or in the community):  No (Comment)  Discharge Needs  Concerns to be addressed:  Basic Needs, Care Coordination Readmission within the last 30 days:  No Current discharge risk:  Dependent with Mobility Barriers to Discharge:  Continued Medical Work up   [employer], LCSW 01/09/2018, 5:02 PM

## 2018-01-09 NOTE — Progress Notes (Signed)
PROGRESS NOTE    [name] [name]  TOI:[id number] DOB: 16-Jan-1930 DOA: 01/06/2018 PCP: [name], MD   Brief Narrative:  HPI on 01/06/2018 by Dr. [name] [name] [name] is a 84 y.o. female with history of chronic atrial fibrillation on Coumadin, diabetes mellitus type 2, hypertension, hyperlipidemia, COPD, C. difficile colitis, who presents with a mechanical fall and hip pain.  Patient ambulates with a walker at baseline due to balance problems but she practices with a walker at home.  Today, she was attempting to walk her closet to the bathroom she suddenly lost her balance and fell on her left side hitting her head and her left hip, for immediate pain in her left hip that was severe.  She was unable to get up without assistance.  She had to wait for her husband returned from walking her dog for help.  She has very severe pain in the left lateral with any sort of movement but feels relatively comfortable when resting still.  Last few days she has felt a little fatigued and has had a mild cough but has otherwise been feeling well.  She denies dysuria, increased urinary frequency or urgency, or fevers.    Interim history Admitted for left proximal femur fracture s/p surgery Assessment & Plan   Acute left proximal femur fracture -s/p fall -Orthopedic surgery consulted and appreciated, status post left hip hemiarthroplasty -Patient uses a walker at baseline -PT recommended SNF -Continue pain control  Leukocytosis  -thought to be reactive  -WBC currently up to 15 from 11.6 -UA rare bacteria, 6-30 WBC, moderate leukocytes -CXR on admission showed mild interstitial pulmonary edema, right basilar opacity possibly atelectasis -Given that leukocytosis is increased today, will repeat chest x-ray  UTI -UA as above, urine culture >100K Ecoli, pansensitive  -Currently on ceftriaxone  Chronic atrial fibrillation -On Coumadin, on admission, patient was given 5 mg of IV vitamin K  for reversal of INR 2.06 -Coumadin has been restarted, currently INR 1.22; continue bridging with full dose Lovenox -CHADSVASC 4 -Currently on diltiazem and metoprolol with holding parameters  Diabetes mellitus, type II with neuropathy -Continue Lantus, insulin sliding scale CBG monitoring -Home oral medications held -Hemoglobin A1c 7.1 -Currently on gabapentin, may need to be tapered as this currently does not appear to be helping her and may be contributing to her lightheadedness   Anemia of acute blood loss -Likely secondary to recent surgery -Hemoglobin currently 10.8 baseline appears to be approximately 14 -Continue to monitor CBC closely given the patient is on Lovenox as well as Coumadin  Essential hypertension -Continue metoprolol and diltiazem  COPD -appears to be stable, continue Dulera (in place of symbicort)  GERD -Continue PPI   Anxiety -Continue Ativan as needed  Bladder spasms -Continue oxybutynin  Glaucoma -continue eyedrops  Chronic kidney disease, stage III -Creatinine appears to be stable, continue to monitor BMP  DVT Prophylaxis Coumadin/Lovenox  Code Status: Full  Family Communication: None at bedside  Disposition Plan: Admitted.  Suspect patient will be discharged to SNF within the next 24-48 hours  Consultants Orthopedic surgery  Procedures  Left hip hemiarthroplasty  Antibiotics   Anti-infectives (From admission, onward)   Start     Dose/Rate Route Frequency Ordered Stop   01/08/18 1200  cefTRIAXone (ROCEPHIN) 1 g in sodium chloride 0.9 % 100 mL IVPB     1 g 200 mL/hr over 30 Minutes Intravenous Every 24 hours 01/08/18 1122     01/07/18 1945  ceFAZolin (ANCEF) IVPB 2g/100 mL premix  2 g 200 mL/hr over 30 Minutes Intravenous Every 6 hours 01/07/18 1937 01/08/18 0238   01/07/18 1700  ceFAZolin (ANCEF) IVPB 2 g/50 mL premix     2 g 100 mL/hr over 30 Minutes Intravenous  Once 01/07/18 1647 01/07/18 1650   01/07/18 1544  vancomycin  (VANCOCIN) powder  Status:  Discontinued       As needed 01/07/18 1544 01/07/18 1747      Subjective:   [name] [name] seen and examined today.  States she has some pain. Denies any current chest pain, shortness of breath, abdominal pain, nausea or vomiting, dizziness or headache.  Objective:   Vitals:   01/08/18 2020 01/08/18 2209 01/09/18 0612 01/09/18 0806  BP: 132/69  (!) 111/57   Pulse: 92  70   Resp: 18  18   Temp: 98 F (36.7 C)  97.7 F (36.5 C)   TempSrc: Oral  Oral   SpO2: 94% 94% 94% 96%  Weight:      Height:        Intake/Output Summary (Last 24 hours) at 01/09/2018 1227 Last data filed at 01/09/2018 0900 Gross per 24 hour  Intake 2110 ml  Output 800 ml  Net 1310 ml   Filed Weights   01/06/18 1336  Weight: 63 kg (139 lb)    Exam  General: Well developed, well nourished, NAD, appears stated age  HEENT: NCAT, mucous membranes moist.   Neck: Supple  Cardiovascular: S1 S2 auscultated, irregular, no murmur  Respiratory: Clear to auscultation bilaterally with equal chest rise  Abdomen: Soft, nontender, nondistended, + bowel sounds  Extremities: warm dry without cyanosis clubbing or edema. LLE with ecchymosis, hematoma noted   Neuro: AAOx3, nonfocal  Psych: Appropriate mood and affect   Data Reviewed: I have personally reviewed following labs and imaging studies  CBC: Recent Labs  Lab 01/06/18 1541 01/07/18 1123 01/08/18 0443 01/09/18 0903  WBC 19.2* 13.3* 11.6* 15.0*  NEUTROABS 15.9*  --   --   --   HGB 15.5* 14.6 11.9* 10.8*  HCT 45.5 43.6 36.8 32.9*  MCV 91.7 91.0 92.9 92.4  PLT 252 232 236 659   Basic Metabolic Panel: Recent Labs  Lab 01/06/18 1424 01/07/18 1123 01/08/18 0443 01/09/18 0903  NA 138 136 136 134*  K 5.3* 3.7 3.7 4.0  CL 101 100* 101 100*  CO2 26 22 20* 23  GLUCOSE 148* 195* 225* 219*  BUN 11 9 19  23*  CREATININE 0.82 0.85 0.96 1.04*  CALCIUM 9.6 8.9 8.7* 8.7*   GFR: Estimated Creatinine Clearance: 32.8  mL/min (A) (by C-G formula based on SCr of 1.04 mg/dL (H)). Liver Function Tests: Recent Labs  Lab 01/09/18 0903  AST 40  ALT 5*  ALKPHOS 60  BILITOT 1.2  PROT 6.1*  ALBUMIN 3.0*   No results for input(s): LIPASE, AMYLASE in the last 168 hours. No results for input(s): AMMONIA in the last 168 hours. Coagulation Profile: Recent Labs  Lab 01/06/18 1541 01/07/18 1123 01/09/18 0903  INR 2.06 1.27 1.22   Cardiac Enzymes: No results for input(s): CKTOTAL, CKMB, CKMBINDEX, TROPONINI in the last 168 hours. BNP (last 3 results) No results for input(s): PROBNP in the last 8760 hours. HbA1C: Recent Labs    01/07/18 1123  HGBA1C 7.1*   CBG: Recent Labs  Lab 01/08/18 0857 01/08/18 1356 01/08/18 2022 01/09/18 0218 01/09/18 0939  GLUCAP 231* 281* 231* 151* 209*   Lipid Profile: No results for input(s): CHOL, HDL, LDLCALC, TRIG, CHOLHDL, LDLDIRECT in  the last 72 hours. Thyroid Function Tests: No results for input(s): TSH, T4TOTAL, FREET4, T3FREE, THYROIDAB in the last 72 hours. Anemia Panel: No results for input(s): VITAMINB12, FOLATE, FERRITIN, TIBC, IRON, RETICCTPCT in the last 72 hours. Urine analysis:    Component Value Date/Time   COLORURINE YELLOW 01/06/2018 1924   APPEARANCEUR HAZY (A) 01/06/2018 1924   LABSPEC 1.008 01/06/2018 1924   PHURINE 7.0 01/06/2018 1924   GLUCOSEU NEGATIVE 01/06/2018 1924   HGBUR NEGATIVE 01/06/2018 1924   BILIRUBINUR NEGATIVE 01/06/2018 1924   KETONESUR NEGATIVE 01/06/2018 1924   PROTEINUR 100 (A) 01/06/2018 1924   UROBILINOGEN 0.2 11/23/2013 1431   NITRITE NEGATIVE 01/06/2018 1924   LEUKOCYTESUR MODERATE (A) 01/06/2018 1924   Sepsis Labs: @LABRCNTIP (procalcitonin:4,lacticidven:4)  ) Recent Results (from the past 240 hour(s))  Culture, Urine     Status: Abnormal   Collection Time: 01/06/18  7:24 PM  Result Value Ref Range Status   Specimen Description   Final    URINE, CLEAN CATCH Performed at [facility],  [place] [address], [place], [place] [address]    Special Requests   Final    NONE Performed at [facility], [place] [address], [place], [place] [address]    Culture >=100,000 COLONIES/mL ESCHERICHIA COLI (A)  Final   Report Status 01/08/2018 FINAL  Final   Organism ID, Bacteria ESCHERICHIA COLI (A)  Final      Susceptibility   Escherichia coli - MIC*    AMPICILLIN 8 SENSITIVE Sensitive     CEFAZOLIN <=4 SENSITIVE Sensitive     CEFTRIAXONE <=1 SENSITIVE Sensitive     CIPROFLOXACIN <=0.25 SENSITIVE Sensitive     GENTAMICIN <=1 SENSITIVE Sensitive     IMIPENEM <=0.25 SENSITIVE Sensitive     NITROFURANTOIN <=16 SENSITIVE Sensitive     TRIMETH/SULFA <=20 SENSITIVE Sensitive     AMPICILLIN/SULBACTAM <=2 SENSITIVE Sensitive     PIP/TAZO <=4 SENSITIVE Sensitive     Extended ESBL NEGATIVE Sensitive     * >=100,000 COLONIES/mL ESCHERICHIA COLI  Surgical pcr screen     Status: None   Collection Time: 01/07/18  9:38 AM  Result Value Ref Range Status   MRSA, PCR NEGATIVE NEGATIVE Final   Staphylococcus aureus NEGATIVE NEGATIVE Final    Comment: (NOTE) The Xpert SA Assay (FDA approved for NASAL specimens in patients 82 years of age and older), is one component of a comprehensive surveillance program. It is not intended to diagnose infection nor to guide or monitor treatment. Performed at [facility], [place] [address], [place], [place] [address]       Radiology Studies: Pelvis Portable  Result Date: 01/07/2018 CLINICAL DATA:  Left hip hemiarthroplasty. EXAM: PORTABLE PELVIS 1-2 VIEWS COMPARISON:  CT left hip from yesterday. FINDINGS: The left hip demonstrates a hemiarthroplasty without evidence of hardware failure or complication. There is expected intra-articular air. There is no fracture or dislocation. The alignment is anatomic. Post-surgical changes noted in the surrounding soft tissues. IMPRESSION: Interval left hip hemiarthroplasty without evidence of acute  postoperative complication. Electronically Signed   By: Titus Dubin M.D.   On: 01/07/2018 19:17     Scheduled Meds: . diltiazem  120 mg Oral Daily  . docusate sodium  100 mg Oral BID  . enoxaparin (LOVENOX) injection  1 mg/kg Subcutaneous Q12H  . famotidine  20 mg Oral BID  . gabapentin  100 mg Oral TID  . insulin aspart  0-9 Units Subcutaneous Q6H  . insulin glargine  10 Units  Subcutaneous Q2200  . latanoprost  1 drop Both Eyes QHS  . methylphenidate  5 mg Oral Daily  . metoprolol tartrate  25 mg Oral BID  . mirtazapine  7.5 mg Oral QHS  . mometasone-formoterol  2 puff Inhalation BID  . multivitamin with minerals  1 tablet Oral Daily  . oxybutynin  5 mg Oral QHS  . pantoprazole  80 mg Oral Daily  . sucralfate  1 g Oral BID  . timolol  1 drop Both Eyes Daily  . warfarin  5 mg Oral ONCE-1800  . Warfarin - Pharmacist Dosing Inpatient   Does not apply q1800   Continuous Infusions: . sodium chloride 50 mL/hr at 01/07/18 2137  . cefTRIAXone (ROCEPHIN)  IV 1 g (01/09/18 1155)     LOS: 3 days   Time Spent in minutes   30 minutes  Alexandru Moorer D.O. on 01/09/2018 at 12:27 PM  Between 7am to 7pm - Pager - [phone]  After 7pm go to [email or web address] - password [email or web address]  And look for the night coverage person covering for me after hours  [facility] Office  [phone]

## 2018-01-09 NOTE — Progress Notes (Signed)
ANTICOAGULATION CONSULT NOTE - f/u Consult  Pharmacy Consult for Coumadin Indication: atrial fibrillation  Allergies  Allergen Reactions  . Lactose Intolerance (Gi) Nausea And Vomiting    severe stomach pain  . Penicillins Hives and Itching    "haven't used any since 1970's" "haven't used any since 1970's"  . Sulfa Antibiotics Hives and Itching    "haven't used any since 1970's"  . Ciprofloxacin Swelling  . Sulfasalazine Hives and Itching    "haven't used any since 1970's"    Patient Measurements: Height: 5' (152.4 cm) Weight: 139 lb (63 kg) IBW/kg (Calculated) : 45.5  Vital Signs: Temp: 97.7 F (36.5 C) (04/07 0612) Temp Source: Oral (04/07 0612) BP: 111/57 (04/07 0612) Pulse Rate: 70 (04/07 0612)  Labs: Recent Labs    01/06/18 1541 01/07/18 1123 01/08/18 0443 01/09/18 0903  HGB 15.5* 14.6 11.9* 10.8*  HCT 45.5 43.6 36.8 32.9*  PLT 252 232 236 221  LABPROT 23.1* 15.8*  --  15.3*  INR 2.06 1.27  --  1.22  CREATININE  --  0.85 0.96 1.04*    Estimated Creatinine Clearance: 32.8 mL/min (A) (by C-G formula based on SCr of 1.04 mg/dL (H)).   Medical History: Past Medical History:  Diagnosis Date  . Atrial fibrillation (HCC)    RVR hx.  Chronic Coumadin  . C. difficile colitis 09/21/2013, 11/2013  . Carotid stenosis    a. Carotid US (10/2013):  bilat 1-39%; f/u 1 year  . Chronic lower back pain   . COPD (chronic obstructive pulmonary disease) ([place])   . Depression   . GERD (gastroesophageal reflux disease)    with HH  . Glaucoma   . HCAP (healthcare-associated pneumonia) 11/21/2011  . High cholesterol   . Hypertension   . Metabolic encephalopathy 2/0/9470  . Mitral regurgitation 03/11/2012  . Pancreatitis    ~2008, 11/2011 post ERCP  . Pneumonia 12/2011   "first time I know about"  . Sepsis ([place]) 11/27/2013  . SIRS (systemic inflammatory response syndrome) ([place]) 03/13/2012   a/w post ERCP  pancreatitis.   . Type II diabetes mellitus ([place])      Assessment:  Anticoag: Afib. CHA2DS2-VASc 4. Baseline Hgb 15.5>11.9>10.8 today post-op. INR 1.22 - 4/4: Vitamin K 5mg  IV x 1 - PTA warfarin 2.5mg  daily except 1.25mg  SunWed for afib. Admit INR 2.06  Goal of Therapy:  INR 2-3 Monitor platelets by anticoagulation protocol: Yes   Plan:  Lovenox 65mg  BID x 3 days? Call MD to continue after 4/8 Coumadin 5mg  po x 1 tonight Daily INR  Marcelina Mclaurin S. Alford Highland, PharmD, BCPS Clinical Staff Pharmacist Pager [phone]  [place], [place] 01/09/2018,11:03 AM

## 2018-01-10 LAB — PROTIME-INR
INR: 1.34
Prothrombin Time: 16.5 seconds — ABNORMAL HIGH (ref 11.4–15.2)

## 2018-01-10 LAB — CBC
HEMATOCRIT: 31.9 % — AB (ref 36.0–46.0)
Hemoglobin: 10.2 g/dL — ABNORMAL LOW (ref 12.0–15.0)
MCH: 29.8 pg (ref 26.0–34.0)
MCHC: 32 g/dL (ref 30.0–36.0)
MCV: 93.3 fL (ref 78.0–100.0)
Platelets: 221 10*3/uL (ref 150–400)
RBC: 3.42 MIL/uL — ABNORMAL LOW (ref 3.87–5.11)
RDW: 13.7 % (ref 11.5–15.5)
WBC: 12.5 10*3/uL — ABNORMAL HIGH (ref 4.0–10.5)

## 2018-01-10 LAB — GLUCOSE, CAPILLARY
GLUCOSE-CAPILLARY: 124 mg/dL — AB (ref 65–99)
GLUCOSE-CAPILLARY: 249 mg/dL — AB (ref 65–99)
GLUCOSE-CAPILLARY: 167 mg/dL — AB (ref 65–99)
Glucose-Capillary: 193 mg/dL — ABNORMAL HIGH (ref 65–99)
Glucose-Capillary: 166 mg/dL — ABNORMAL HIGH (ref 65–99)

## 2018-01-10 LAB — BASIC METABOLIC PANEL
Anion gap: 12 (ref 5–15)
BUN: 16 mg/dL (ref 6–20)
CHLORIDE: 105 mmol/L (ref 101–111)
CO2: 23 mmol/L (ref 22–32)
Calcium: 8.3 mg/dL — ABNORMAL LOW (ref 8.9–10.3)
Creatinine, Ser: 0.82 mg/dL (ref 0.44–1.00)
GFR calc Af Amer: >60 mL/min (ref >60)
GFR calc non Af Amer: >60 mL/min (ref >60)
GLUCOSE: 84 mg/dL (ref 65–99)
POTASSIUM: 2.9 mmol/L — AB (ref 3.5–5.1)
Sodium: 140 mmol/L (ref 135–145)

## 2018-01-10 LAB — HEMOGLOBIN AND HEMATOCRIT, BLOOD
HCT: 32.5 % — ABNORMAL LOW (ref 36.0–46.0)
Hemoglobin: 10.4 g/dL — ABNORMAL LOW (ref 12.0–15.0)

## 2018-01-10 MED ORDER — WARFARIN SODIUM 7.5 MG PO TABS
7.5000 mg | ORAL_TABLET | Freq: Once | ORAL | Status: AC
  Administered 2018-01-10: 7.5 mg via ORAL
  Filled 2018-01-10: qty 1

## 2018-01-10 MED ORDER — GLUCERNA SHAKE PO LIQD: 237.0000 mL | Freq: Three times a day (TID) | ORAL | Status: DC

## 2018-01-10 MED ORDER — OXYCODONE HCL 5 MG PO TABS: ORAL_TABLET | ORAL | 0 refills | Status: AC

## 2018-01-10 MED ORDER — ACETAMINOPHEN 500 MG PO TABS: 1000.0000 mg | ORAL_TABLET | Freq: Three times a day (TID) | ORAL | 0 refills | Status: AC

## 2018-01-10 MED ORDER — ENOXAPARIN SODIUM 80 MG/0.8ML ~~LOC~~ SOLN
1.0000 mg/kg | Freq: Two times a day (BID) | SUBCUTANEOUS | Status: DC
  Administered 2018-01-10 – 2018-01-11 (×2): 65 mg via SUBCUTANEOUS
  Filled 2018-01-10 (×2): qty 0.8

## 2018-01-10 NOTE — Progress Notes (Signed)
ORTHOPAEDIC PROGRESS NOTE  s/p Procedure(s): L hip hemiarthroplasty  SUBJECTIVE: Minimal pain, continues to have diarrhea which puts incision at risk of infection  OBJECTIVE: PE: LLE: incision CDI, abduction pillow in place, leg lengths equal, warm well perfused foot, intact EHL/TA/GSC   Vitals:   01/10/18 0529 01/10/18 1254  BP: 134/61 (!) 120/49  Pulse: 85 84  Resp: 20 16  Temp: 97.9 F (36.6 C)   SpO2: (!) 89% 96%     ASSESSMENT: [name] [name] is a 84 y.o. female doing well postoperatively.  PLAN: Weightbearing: WBAT LLE w posterior hip precautions Insicional and dressing care: Ideally aquacel to be left in place until followup in 1 week.  Since patient having diarrhea may require incisional check if anything on dressing.  I replaced dressing 4/8 at 1345 myself.  If any significant spotting on dressing needs to be changed by SNF or RN Orthopedic device(s): Abduction pillow or just pillows between legs while sleeping. Showering: prn VTE prophylaxis: Baseline coumadin  Pain control: tylenol scheduled Follow - up plan: 1 week Contact information:  Weekdays 8-5 Ophelia Charter MD [phone], After hours and holidays please check [email or web address] for group call information for [facility]

## 2018-01-10 NOTE — Progress Notes (Signed)
ANTICOAGULATION CONSULT NOTE - f/u Consult  Pharmacy Consult for Coumadin Indication: atrial fibrillation  Allergies  Allergen Reactions  . Lactose Intolerance (Gi) Nausea And Vomiting    severe stomach pain  . Penicillins Hives and Itching    "haven't used any since 1970's" "haven't used any since 1970's"  . Sulfa Antibiotics Hives and Itching    "haven't used any since 1970's"  . Ciprofloxacin Swelling  . Sulfasalazine Hives and Itching    "haven't used any since 1970's"    Patient Measurements: Height: 5' (152.4 cm) Weight: 139 lb (63 kg) IBW/kg (Calculated) : 45.5  Vital Signs: Temp: 97.9 F (36.6 C) (04/08 0529) Temp Source: Oral (04/08 0529) BP: 134/61 (04/08 0529) Pulse Rate: 85 (04/08 0529)  Labs: Recent Labs    01/08/18 0443 01/09/18 0903 01/10/18 0622  HGB 11.9* 10.8* 10.2*  HCT 36.8 32.9* 31.9*  PLT 236 221 221  LABPROT  --  15.3* 16.5*  INR  --  1.22 1.34  CREATININE 0.96 1.04* 0.82    Estimated Creatinine Clearance: 41.6 mL/min (by C-G formula based on SCr of 0.82 mg/dL).  CC/HPI: hip fx after fall. THA 4/5 PM.  PMH: Afib, DM, HTN, HLD, COPD, h/o cdiff 2015, carotid stenosis, chronic low back pain, COPD, depression, GERD, glaucoma, HLD, HTN, neuropathy  Anticoag: Afib. CHA2DS2-VASc 4. Baseline Hgb 15.5>11.9>10.8 today post-op. INR 1.22 - 4/4: Vitamin K 5mg  IV x 1 - PTA warfarin 2.5mg  daily except 1.25mg  SunWed for afib. Admit INR 2.06  Renal: Scr 0.82  Heme/Onc: H&H 10.2/31.9, Plt 221  Goal of Therapy:  INR 2-3 Monitor platelets by anticoagulation protocol: Yes   Plan:  Lovenox 65mg  BID  Coumadin 7.5 mg po x 1 tonight (try to overcome to vit k) Daily INR  Levester Fresh, PharmD, BCPS, BCCCP Clinical Pharmacist Clinical phone for 01/10/2018 from 7a-3:30p: [phone] If after 3:30p, please call main pharmacy at: [phone] 01/10/2018 12:52 PM

## 2018-01-10 NOTE — Progress Notes (Signed)
Nutrition Follow Up  DOCUMENTATION CODES:   Not applicable  INTERVENTION:    Glucerna Shake po TID, each supplement provides 220 kcal and 10 grams of protein  NUTRITION DIAGNOSIS:   Increased nutrient needs related to hip fracture as evidenced by estimated needs, ongoing  GOAL:   Patient will meet greater than or equal to 90% of their needs, progressing  MONITOR:   PO intake, Supplement acceptance, Labs, Skin, Weight trends, I & O's  ASSESSMENT:   84 y.o. F admitted for L hip fracture. PMH of chronic a.fib, T2DM, hypertension, hyperlipidemia, COPD, and infections (C.dif, sepsis, recurrent pneumonia). PSH of cholecystectomy.   Pt sleeping soundly upon visit. Name called x 2. Unable to wake. Observed breakfast meal untouched on pt's tray table. PO intake variable at 25-50% per flowsheet records. Labs and medications reviewed. K 2.9 (L). CBG's [phone].  Will order Glucerna Shake vs Ensure Enlive to optimize glycemic control. Unable to complete Nutrition-Focused physical exam at this time.   Diet Order:  Diet regular Room service appropriate? Yes; Fluid consistency: Thin Posterior total hip precautions  EDUCATION NEEDS:   Education needs have been addressed  Skin:  Skin Assessment: Skin Integrity Issues: Skin Integrity Issues:: Other (Comment) Other: skin tear L arm, ecchymosis L back  Last BM:  4/7  Height:   Ht Readings from Last 1 Encounters:  01/06/18 5' (1.524 m)    Weight:   Wt Readings from Last 1 Encounters:  01/06/18 139 lb (63 kg)    Ideal Body Weight:  45.45 kg  BMI:  Body mass index is 27.15 kg/m.  Estimated Nutritional Needs:   Kcal:  1300-1500 kcal  Protein:  65-80 grams  Fluid:  >/= 1.5 L  Arthur Holms, RD, LDN Pager #: [phone] After-Hours Pager #: [phone]

## 2018-01-10 NOTE — Progress Notes (Signed)
PROGRESS NOTE    [name] [name]  ZOX:[id number] DOB: 1930-03-17 DOA: 01/06/2018 PCP: [name], MD   Brief Narrative:  HPI on 01/06/2018 by Dr. [name] [name] [name] is a 84 y.o. female with history of chronic atrial fibrillation on Coumadin, diabetes mellitus type 2, hypertension, hyperlipidemia, COPD, C. difficile colitis, who presents with a mechanical fall and hip pain.  Patient ambulates with a walker at baseline due to balance problems but she practices with a walker at home.  Today, she was attempting to walk her closet to the bathroom she suddenly lost her balance and fell on her left side hitting her head and her left hip, for immediate pain in her left hip that was severe.  She was unable to get up without assistance.  She had to wait for her husband returned from walking her dog for help.  She has very severe pain in the left lateral with any sort of movement but feels relatively comfortable when resting still.  Last few days she has felt a little fatigued and has had a mild cough but has otherwise been feeling well.  She denies dysuria, increased urinary frequency or urgency, or fevers.    Interim history Admitted for left proximal femur fracture s/p surgery Assessment & Plan   Acute left proximal femur fracture -s/p fall -Orthopedic surgery consulted and appreciated, status post left hip hemiarthroplasty -Patient uses a walker at baseline -PT recommended SNF- pending insurance authorization -Continue pain control  Leukocytosis  -thought to be reactive  -WBC improving 12.5 today -UA rare bacteria, 6-30 WBC, moderate leukocytes -CXR on admission showed mild interstitial pulmonary edema, right basilar opacity possibly atelectasis -repeat CXR shows possible pneumonia  UTI -UA as above, urine culture >100K Ecoli, pansensitive  -Currently on ceftriaxone  Chronic atrial fibrillation -On Coumadin, on admission, patient was given 5 mg of IV vitamin K for  reversal of INR 2.06 -Coumadin has been restarted, currently INR 1.34; continue bridging with full dose Lovenox -CHADSVASC 4 -Currently on diltiazem and metoprolol with holding parameters  Diabetes mellitus, type II with neuropathy -Continue Lantus, insulin sliding scale CBG monitoring -Home oral medications held -Hemoglobin A1c 7.1 -Currently on gabapentin, may need to be tapered as this currently does not appear to be helping her and may be contributing to her lightheadedness   Anemia of acute blood loss -Likely secondary to recent surgery -Hemoglobin currently 10.2 baseline appears to be approximately 14 -Continue to monitor CBC closely given the patient is on Lovenox as well as Coumadin  Essential hypertension -Continue metoprolol and diltiazem  COPD -appears to be stable, continue Dulera (in place of symbicort)  GERD -Continue PPI   Anxiety -Continue Ativan as needed  Bladder spasms -Continue oxybutynin  Glaucoma -continue eyedrops  Chronic kidney disease, stage III -Creatinine appears to be stable, continue to monitor BMP  DVT Prophylaxis Coumadin/Lovenox  Code Status: Full  Family Communication: None at bedside  Disposition Plan: Admitted.  Suspect patient will be discharged to SNF within the next 24-48 hours pending insurance authorization  Consultants Orthopedic surgery  Procedures  Left hip hemiarthroplasty  Antibiotics   Anti-infectives (From admission, onward)   Start     Dose/Rate Route Frequency Ordered Stop   01/08/18 1200  cefTRIAXone (ROCEPHIN) 1 g in sodium chloride 0.9 % 100 mL IVPB     1 g 200 mL/hr over 30 Minutes Intravenous Every 24 hours 01/08/18 1122     01/07/18 1945  ceFAZolin (ANCEF) IVPB 2g/100 mL premix  2 g 200 mL/hr over 30 Minutes Intravenous Every 6 hours 01/07/18 1937 01/08/18 0238   01/07/18 1700  ceFAZolin (ANCEF) IVPB 2 g/50 mL premix     2 g 100 mL/hr over 30 Minutes Intravenous  Once 01/07/18 1647 01/07/18 1650     01/07/18 1544  vancomycin (VANCOCIN) powder  Status:  Discontinued       As needed 01/07/18 1544 01/07/18 1747      Subjective:   Micah Murray-Rush seen and examined today.  Not very interactive today. Not wanting to engage in conversation.   Objective:   Vitals:   01/09/18 2045 01/09/18 2052 01/10/18 0529 01/10/18 1254  BP: (!) 129/56  134/61 (!) 120/49  Pulse: 88 76 85 84  Resp: 20 18 20 16   Temp: 98.6 F (37 C)  97.9 F (36.6 C)   TempSrc: Oral  Oral   SpO2: 94% 92% (!) 89% 96%  Weight:      Height:        Intake/Output Summary (Last 24 hours) at 01/10/2018 1501 Last data filed at 01/10/2018 1300 Gross per 24 hour  Intake 360 ml  Output -  Net 360 ml   Filed Weights   01/06/18 1336  Weight: 63 kg (139 lb)   Exam  General: Well developed, well nourished, NAD, appears stated age  79: NCAT, mucous membranes moist.   Neck: Supple  Cardiovascular: S1 S2 auscultated, irregular, np murmur  Respiratory: Clear to auscultation bilaterally with equal chest rise  Abdomen: Soft, nontender, nondistended, + bowel sounds  Extremities: warm dry without cyanosis clubbing or edema  Neuro: AAOx3 (at first patient stated "I don't know" when asked where she was and her name,etc- after explaining that this could keep her longer, she was able to answer the questions), nonfocal  Data Reviewed: I have personally reviewed following labs and imaging studies  CBC: Recent Labs  Lab 01/06/18 1541 01/07/18 1123 01/08/18 0443 01/09/18 0903 01/10/18 0622  WBC 19.2* 13.3* 11.6* 15.0* 12.5*  NEUTROABS 15.9*  --   --   --   --   HGB 15.5* 14.6 11.9* 10.8* 10.2*  HCT 45.5 43.6 36.8 32.9* 31.9*  MCV 91.7 91.0 92.9 92.4 93.3  PLT 252 232 236 221 564   Basic Metabolic Panel: Recent Labs  Lab 01/06/18 1424 01/07/18 1123 01/08/18 0443 01/09/18 0903 01/10/18 0622  NA 138 136 136 134* 140  K 5.3* 3.7 3.7 4.0 2.9*  CL 101 100* 101 100* 105  CO2 26 22 20* 23 23  GLUCOSE 148*  195* 225* 219* 84  BUN 11 9 19  23* 16  CREATININE 0.82 0.85 0.96 1.04* 0.82  CALCIUM 9.6 8.9 8.7* 8.7* 8.3*   GFR: Estimated Creatinine Clearance: 41.6 mL/min (by C-G formula based on SCr of 0.82 mg/dL). Liver Function Tests: Recent Labs  Lab 01/09/18 0903  AST 40  ALT 5*  ALKPHOS 60  BILITOT 1.2  PROT 6.1*  ALBUMIN 3.0*   No results for input(s): LIPASE, AMYLASE in the last 168 hours. No results for input(s): AMMONIA in the last 168 hours. Coagulation Profile: Recent Labs  Lab 01/06/18 1541 01/07/18 1123 01/09/18 0903 01/10/18 0622  INR 2.06 1.27 1.22 1.34   Cardiac Enzymes: No results for input(s): CKTOTAL, CKMB, CKMBINDEX, TROPONINI in the last 168 hours. BNP (last 3 results) No results for input(s): PROBNP in the last 8760 hours. HbA1C: No results for input(s): HGBA1C in the last 72 hours. CBG: Recent Labs  Lab 01/09/18 0939 01/09/18 1630 01/09/18  2002 01/10/18 0223 01/10/18 1252  GLUCAP 209* 196* 197* 124* 249*   Lipid Profile: No results for input(s): CHOL, HDL, LDLCALC, TRIG, CHOLHDL, LDLDIRECT in the last 72 hours. Thyroid Function Tests: No results for input(s): TSH, T4TOTAL, FREET4, T3FREE, THYROIDAB in the last 72 hours. Anemia Panel: No results for input(s): VITAMINB12, FOLATE, FERRITIN, TIBC, IRON, RETICCTPCT in the last 72 hours. Urine analysis:    Component Value Date/Time   COLORURINE YELLOW 01/06/2018 1924   APPEARANCEUR HAZY (A) 01/06/2018 1924   LABSPEC 1.008 01/06/2018 1924   PHURINE 7.0 01/06/2018 1924   GLUCOSEU NEGATIVE 01/06/2018 1924   HGBUR NEGATIVE 01/06/2018 1924   BILIRUBINUR NEGATIVE 01/06/2018 1924   KETONESUR NEGATIVE 01/06/2018 1924   PROTEINUR 100 (A) 01/06/2018 1924   UROBILINOGEN 0.2 11/23/2013 1431   NITRITE NEGATIVE 01/06/2018 1924   LEUKOCYTESUR MODERATE (A) 01/06/2018 1924   Sepsis Labs: @LABRCNTIP (procalcitonin:4,lacticidven:4)  ) Recent Results (from the past 240 hour(s))  Culture, Urine     Status:  Abnormal   Collection Time: 01/06/18  7:24 PM  Result Value Ref Range Status   Specimen Description   Final    URINE, CLEAN CATCH Performed at [facility], [place] [address], [place], [place] [address]    Special Requests   Final    NONE Performed at [facility], [place] [address], [place], [place] [address]    Culture >=100,000 COLONIES/mL ESCHERICHIA COLI (A)  Final   Report Status 01/08/2018 FINAL  Final   Organism ID, Bacteria ESCHERICHIA COLI (A)  Final      Susceptibility   Escherichia coli - MIC*    AMPICILLIN 8 SENSITIVE Sensitive     CEFAZOLIN <=4 SENSITIVE Sensitive     CEFTRIAXONE <=1 SENSITIVE Sensitive     CIPROFLOXACIN <=0.25 SENSITIVE Sensitive     GENTAMICIN <=1 SENSITIVE Sensitive     IMIPENEM <=0.25 SENSITIVE Sensitive     NITROFURANTOIN <=16 SENSITIVE Sensitive     TRIMETH/SULFA <=20 SENSITIVE Sensitive     AMPICILLIN/SULBACTAM <=2 SENSITIVE Sensitive     PIP/TAZO <=4 SENSITIVE Sensitive     Extended ESBL NEGATIVE Sensitive     * >=100,000 COLONIES/mL ESCHERICHIA COLI  Surgical pcr screen     Status: None   Collection Time: 01/07/18  9:38 AM  Result Value Ref Range Status   MRSA, PCR NEGATIVE NEGATIVE Final   Staphylococcus aureus NEGATIVE NEGATIVE Final    Comment: (NOTE) The Xpert SA Assay (FDA approved for NASAL specimens in patients 46 years of age and older), is one component of a comprehensive surveillance program. It is not intended to diagnose infection nor to guide or monitor treatment. Performed at [facility], [place] [address] [place],  [address]       Radiology Studies: Dg Chest Port 1 View  Result Date: 01/09/2018 CLINICAL DATA:  Leukocytosis. EXAM: PORTABLE CHEST 1 VIEW COMPARISON:  January 06, 2018 FINDINGS: The elevated right hemidiaphragm persists. Superimposed opacity is unchanged since January 06, 2018 but new compared to more remote studies. The heart, hila, mediastinum, lungs, and pleura are  otherwise unchanged. IMPRESSION: Opacity projected over the elevated right hemidiaphragm may represent atelectasis or pneumonia. Recommend follow-up to resolution. Electronically Signed   By: Dorise Bullion III M.D   On: 01/09/2018 15:42     Scheduled Meds: . acetaminophen  650 mg Oral Q6H  . diltiazem  120 mg Oral Daily  . docusate sodium  100 mg Oral BID  . enoxaparin (LOVENOX) injection  1  mg/kg Subcutaneous Q12H  . famotidine  20 mg Oral BID  . feeding supplement (GLUCERNA SHAKE)  237 mL Oral TID BM  . gabapentin  100 mg Oral TID  . insulin aspart  0-9 Units Subcutaneous Q6H  . insulin glargine  10 Units Subcutaneous Q2200  . latanoprost  1 drop Both Eyes QHS  . methylphenidate  5 mg Oral Daily  . metoprolol tartrate  25 mg Oral BID  . mirtazapine  7.5 mg Oral QHS  . mometasone-formoterol  2 puff Inhalation BID  . multivitamin with minerals  1 tablet Oral Daily  . oxybutynin  5 mg Oral QHS  . pantoprazole  80 mg Oral Daily  . sucralfate  1 g Oral BID  . timolol  1 drop Both Eyes Daily  . warfarin  7.5 mg Oral ONCE-1800  . Warfarin - Pharmacist Dosing Inpatient   Does not apply q1800   Continuous Infusions: . sodium chloride 50 mL/hr (01/09/18 1948)  . cefTRIAXone (ROCEPHIN)  IV Stopped (01/10/18 1509)     LOS: 4 days   Time Spent in minutes   30 minutes  Jammal Sarr D.O. on 01/10/2018 at 3:01 PM  Between 7am to 7pm - Pager - [phone]  After 7pm go to [email or web address] - password [email or web address]  And look for the night coverage person covering for me after hours  [facility] Office  [phone]

## 2018-01-10 NOTE — Care Management Important Message (Signed)
Important Message  Patient Details  Name: Emmelia Holdsworth MRN: [id number] Date of Birth: [birth year]   Medicare Important Message Given:  Yes    Jacon Whetzel 01/10/2018, 2:30 PM

## 2018-01-10 NOTE — Progress Notes (Signed)
Physical Therapy Treatment Patient Details Name: [name] [name] MRN: [id number] DOB: [birth year] Today's Date: 01/10/2018    History of Present Illness Patient fell in her home. SShe uses a walker but was not at the time. She suffered a left hip fracture. She required a left hemiarthroslasty on 01/08/2018. PMH: SIRS, sepsis, DMII, Metabolic encephalopathy, COPD, Low back pain, carotid stenosis, C-diff, a-fib     PT Comments    Continuing work on functional mobility and activity tolerance;  Needing encouragement to participate, but noting very nice hip active motion with therex and improving weight acceptance with amb; noted plans for dc to SNF for more post-acute rehab; I anticipate good progress   Follow Up Recommendations  SNF     Equipment Recommendations  Rolling walker with 5" wheels    Recommendations for Other Services       Precautions / Restrictions Precautions Precautions: Fall;Posterior Hip Precaution Booklet Issued: Yes (comment) Precaution Comments: Patient and husband given posterior aproach percaution sheet  Restrictions LLE Weight Bearing: Weight bearing as tolerated    Mobility  Bed Mobility Overal bed mobility: Needs Assistance Bed Mobility: Supine to Sit     Supine to sit: Mod assist     General bed mobility comments: Cues for technique; close guard for precautions; light mod assist to elevate trunk to sit  Transfers Overall transfer level: Needs assistance Equipment used: Rolling walker (2 wheeled) Transfers: Sit to/from Stand Sit to Stand: Min assist         General transfer comment: Cues for positioning fo rpost hip prec and hand placement; min assist to steady  Ambulation/Gait Ambulation/Gait assistance: Min assist;+2 safety/equipment Ambulation Distance (Feet): 8 Feet Assistive device: Rolling walker (2 wheeled) Gait Pattern/deviations: Step-through pattern(emerging)     General Gait Details: Cues to self-monitor for activity  tolerance; Overall nice weight acceptance onto L hip; reported dizziness, so opted to sit   Stairs            Wheelchair Mobility    Modified Rankin (Stroke Patients Only)       Balance     Sitting balance-Leahy Scale: Fair       Standing balance-Leahy Scale: Poor                              Cognition Arousal/Alertness: Awake/alert Behavior During Therapy: WFL for tasks assessed/performed Overall Cognitive Status: Within Functional Limits for tasks assessed                                        Exercises Total Joint Exercises Ankle Circles/Pumps: AROM;Both;20 reps Gluteal Sets: AROM;Both;10 reps Towel Squeeze: AROM;Both;10 reps Heel Slides: AAROM;Left;10 reps Hip ABduction/ADduction: AROM;Left;10 reps    General Comments        Pertinent Vitals/Pain Pain Assessment: Faces Faces Pain Scale: Hurts little more Pain Location: left hip  Pain Descriptors / Indicators: Aching;Grimacing Pain Intervention(s): Monitored during session    Home Living                      Prior Function            PT Goals (current goals can now be found in the care plan section) Acute Rehab PT Goals Patient Stated Goal: to walk further  PT Goal Formulation: With patient/family Time For Goal Achievement: 01/22/18 Potential to Achieve Goals: Good Progress  towards PT goals: Progressing toward goals    Frequency    Min 5X/week      PT Plan Current plan remains appropriate    Co-evaluation              AM-PAC PT "6 Clicks" Daily Activity  Outcome Measure  Difficulty turning over in bed (including adjusting bedclothes, sheets and blankets)?: A Lot Difficulty moving from lying on back to sitting on the side of the bed? : A Lot Difficulty sitting down on and standing up from a chair with arms (e.g., wheelchair, bedside commode, etc,.)?: A Lot Help needed moving to and from a bed to chair (including a wheelchair)?: A  Lot Help needed walking in hospital room?: A Little Help needed climbing 3-5 steps with a railing? : A Lot 6 Click Score: 13    End of Session Equipment Utilized During Treatment: Gait belt Activity Tolerance: Patient tolerated treatment well Patient left: in chair;with call bell/phone within reach;with family/visitor present Nurse Communication: Mobility status PT Visit Diagnosis: Unsteadiness on feet (R26.81);Difficulty in walking, not elsewhere classified (R26.2);Muscle weakness (generalized) (M62.81);Other abnormalities of gait and mobility (R26.89)     Time: 2902-1115 PT Time Calculation (min) (ACUTE ONLY): 33 min  Charges:  $Gait Training: 8-22 mins $Therapeutic Exercise: 8-22 mins                    G Codes:       Roney Marion, PT  Acute Rehabilitation Services Pager [phone] Office [place] 01/10/2018, 1:20 PM

## 2018-01-10 NOTE — Progress Notes (Signed)
CSW confirmed with [facility] that they can accept patient- [facility] starting [employer] auth this morning  CSW will continue to follow  Jorge Ny, [place] Social Worker [phone]

## 2018-01-11 DIAGNOSIS — I482 Chronic atrial fibrillation: Secondary | ICD-10-CM | POA: Diagnosis not present

## 2018-01-11 DIAGNOSIS — R69 Illness, unspecified: Secondary | ICD-10-CM | POA: Diagnosis not present

## 2018-01-11 DIAGNOSIS — S72142A Displaced intertrochanteric fracture of left femur, initial encounter for closed fracture: Secondary | ICD-10-CM | POA: Diagnosis not present

## 2018-01-11 DIAGNOSIS — E1140 Type 2 diabetes mellitus with diabetic neuropathy, unspecified: Secondary | ICD-10-CM | POA: Diagnosis not present

## 2018-01-11 DIAGNOSIS — M79605 Pain in left leg: Secondary | ICD-10-CM | POA: Diagnosis not present

## 2018-01-11 DIAGNOSIS — Z4789 Encounter for other orthopedic aftercare: Secondary | ICD-10-CM | POA: Diagnosis not present

## 2018-01-11 DIAGNOSIS — R278 Other lack of coordination: Secondary | ICD-10-CM | POA: Diagnosis not present

## 2018-01-11 DIAGNOSIS — E1151 Type 2 diabetes mellitus with diabetic peripheral angiopathy without gangrene: Secondary | ICD-10-CM | POA: Diagnosis not present

## 2018-01-11 DIAGNOSIS — S72002A Fracture of unspecified part of neck of left femur, initial encounter for closed fracture: Secondary | ICD-10-CM | POA: Diagnosis not present

## 2018-01-11 DIAGNOSIS — J449 Chronic obstructive pulmonary disease, unspecified: Secondary | ICD-10-CM | POA: Diagnosis not present

## 2018-01-11 DIAGNOSIS — R41841 Cognitive communication deficit: Secondary | ICD-10-CM | POA: Diagnosis not present

## 2018-01-11 DIAGNOSIS — S72002D Fracture of unspecified part of neck of left femur, subsequent encounter for closed fracture with routine healing: Secondary | ICD-10-CM | POA: Diagnosis not present

## 2018-01-11 DIAGNOSIS — M545 Low back pain: Secondary | ICD-10-CM | POA: Diagnosis not present

## 2018-01-11 DIAGNOSIS — R410 Disorientation, unspecified: Secondary | ICD-10-CM | POA: Diagnosis not present

## 2018-01-11 DIAGNOSIS — N3000 Acute cystitis without hematuria: Secondary | ICD-10-CM | POA: Diagnosis not present

## 2018-01-11 DIAGNOSIS — I4891 Unspecified atrial fibrillation: Secondary | ICD-10-CM | POA: Diagnosis not present

## 2018-01-11 DIAGNOSIS — S7292XD Unspecified fracture of left femur, subsequent encounter for closed fracture with routine healing: Secondary | ICD-10-CM | POA: Diagnosis not present

## 2018-01-11 DIAGNOSIS — W19XXXA Unspecified fall, initial encounter: Secondary | ICD-10-CM | POA: Diagnosis not present

## 2018-01-11 DIAGNOSIS — Z794 Long term (current) use of insulin: Secondary | ICD-10-CM | POA: Diagnosis not present

## 2018-01-11 DIAGNOSIS — S79911A Unspecified injury of right hip, initial encounter: Secondary | ICD-10-CM | POA: Diagnosis not present

## 2018-01-11 DIAGNOSIS — R2681 Unsteadiness on feet: Secondary | ICD-10-CM | POA: Diagnosis not present

## 2018-01-11 DIAGNOSIS — Z7901 Long term (current) use of anticoagulants: Secondary | ICD-10-CM | POA: Diagnosis not present

## 2018-01-11 DIAGNOSIS — S72142D Displaced intertrochanteric fracture of left femur, subsequent encounter for closed fracture with routine healing: Secondary | ICD-10-CM | POA: Diagnosis not present

## 2018-01-11 DIAGNOSIS — Z96642 Presence of left artificial hip joint: Secondary | ICD-10-CM | POA: Diagnosis not present

## 2018-01-11 DIAGNOSIS — G8911 Acute pain due to trauma: Secondary | ICD-10-CM | POA: Diagnosis not present

## 2018-01-11 LAB — BASIC METABOLIC PANEL
ANION GAP: 9 (ref 5–15)
BUN: 13 mg/dL (ref 6–20)
CALCIUM: 8.3 mg/dL — AB (ref 8.9–10.3)
CO2: 22 mmol/L (ref 22–32)
Chloride: 109 mmol/L (ref 101–111)
Creatinine, Ser: 0.8 mg/dL (ref 0.44–1.00)
GLUCOSE: 143 mg/dL — AB (ref 65–99)
Potassium: 3.3 mmol/L — ABNORMAL LOW (ref 3.5–5.1)
Sodium: 140 mmol/L (ref 135–145)

## 2018-01-11 LAB — PROTIME-INR
INR: 1.72
PROTHROMBIN TIME: 20 s — AB (ref 11.4–15.2)

## 2018-01-11 LAB — GLUCOSE, CAPILLARY
GLUCOSE-CAPILLARY: 117 mg/dL — AB (ref 65–99)
Glucose-Capillary: 147 mg/dL — ABNORMAL HIGH (ref 65–99)
Glucose-Capillary: 120 mg/dL — ABNORMAL HIGH (ref 65–99)

## 2018-01-11 MED ORDER — GABAPENTIN 100 MG PO CAPS: ORAL_CAPSULE | ORAL | Status: DC

## 2018-01-11 MED ORDER — GLUCERNA SHAKE PO LIQD: 237.0000 mL | Freq: Three times a day (TID) | ORAL | 0 refills | Status: DC

## 2018-01-11 MED ORDER — DOCUSATE SODIUM 100 MG PO CAPS: 100.0000 mg | ORAL_CAPSULE | Freq: Two times a day (BID) | ORAL | 0 refills | Status: DC

## 2018-01-11 MED ORDER — ENOXAPARIN SODIUM 80 MG/0.8ML ~~LOC~~ SOLN: 1.0000 mg/kg | Freq: Two times a day (BID) | SUBCUTANEOUS | Status: DC

## 2018-01-11 MED ORDER — POTASSIUM CHLORIDE CRYS ER 20 MEQ PO TBCR
40.0000 meq | EXTENDED_RELEASE_TABLET | Freq: Once | ORAL | Status: AC
  Administered 2018-01-11: 40 meq via ORAL

## 2018-01-11 MED ORDER — WARFARIN SODIUM 5 MG PO TABS: 2.5000 mg | ORAL_TABLET | Freq: Every day | ORAL | Status: DC

## 2018-01-11 MED ORDER — WARFARIN SODIUM 4 MG PO TABS
4.0000 mg | ORAL_TABLET | Freq: Once | ORAL | Status: DC
  Filled 2018-01-11: qty 1

## 2018-01-11 MED ORDER — LORAZEPAM 1 MG PO TABS: 1.0000 mg | ORAL_TABLET | Freq: Three times a day (TID) | ORAL | 0 refills | Status: DC

## 2018-01-11 NOTE — Progress Notes (Signed)
Traditional [employer] authorization received  Patient will discharge to [facility] Anticipated discharge date: 4/9 Family notified: spouse at bedside Transportation by [facility]- scheduled for 1:30pm  CSW signing off.  Jorge Ny, LCSW Clinical Social Worker [phone]

## 2018-01-11 NOTE — Clinical Social Work Placement (Signed)
   CLINICAL SOCIAL WORK PLACEMENT  NOTE  Date:  01/11/2018  Patient Details  Name: [name] [name] MRN: [id number] Date of Birth: [birth year]  Clinical Social Work is seeking post-discharge placement for this patient at the [place] level of care (*CSW will initial, date and re-position this form in  chart as items are completed):      Patient/family provided with [facility] Work Department's list of facilities offering this level of care within the geographic area requested by the patient (or if unable, by the patient's family).  Yes   Patient/family informed of their freedom to choose among providers that offer the needed level of care, that participate in Medicare, Medicaid or managed care program needed by the patient, have an available bed and are willing to accept the patient.      Patient/family informed of 's ownership interest in [facility] and [facility], as well as of the fact that they are under no obligation to receive care at these facilities.  PASRR submitted to EDS on       PASRR number received on 01/09/18     Existing PASRR number confirmed on       FL2 transmitted to all facilities in geographic area requested by pt/family on 01/09/18     FL2 transmitted to all facilities within larger geographic area on       Patient informed that his/her managed care company has contracts with or will negotiate with certain facilities, including the following:        Yes   Patient/family informed of bed offers received.  Patient chooses bed at [place], [place]     Physician recommends and patient chooses bed at      Patient to be transferred to [place], [place] on 01/11/18.  Patient to be transferred to facility by ptar     Patient family notified on 01/11/18 of transfer.  Name of family member notified:  [name]     PHYSICIAN Please sign FL2     Additional Comment:     _______________________________________________ Jorge Ny, LCSW 01/11/2018, 11:15 AM

## 2018-01-11 NOTE — Discharge Summary (Signed)
Physician Discharge Summary  [name] [name] AST:[id number] DOB: 01/31/1930 DOA: 01/06/2018  PCP: [name], MD  Admit date: 01/06/2018 Discharge date: 01/11/2018  Time spent: 45 minutes  Recommendations for Outpatient Follow-up:  Patient will be discharged to skilled nursing facility.   Continue physical and occupational therapy.  Check INR within 2 days, 01/13/2018. Repeat CBC and BMP in one week. Patient will need to follow up with primary care provider within one week of discharge.   Follow-up with orthopedics, Dr.Varkey, in one week. Patient should continue medications as prescribed.   Patient should follow a heart healthy/carb modified diet.   Discharge Diagnoses:  Acute left proximal femur fracture Leukocytosis  UTI Chronic atrial fibrillation Diabetes mellitus, type II with neuropathy Anemia of acute blood loss Essential hypertension COPD GERD Anxiety Bladder spasms Glaucoma Chronic kidney disease, stage III  Discharge Condition: Stable  Diet recommendation: heart healthy/carb modified  Filed Weights   01/06/18 1336  Weight: 63 kg (139 lb)    History of present illness:  on 01/06/2018 by Dr. [name] Thai [name] a 84 y.o.femalewith history ofchronic atrial fibrillation on Coumadin, diabetes mellitus type 2, hypertension, hyperlipidemia, COPD,C. difficile colitis, who presents with a mechanical fall and hip pain.Patient ambulates with a walker at baseline due to balance problems but she practices with a walker at home. Today, she was attempting to walk her closet to the bathroom she suddenly lost her balance and fell on her left side hitting her head and her left hip, for immediate pain in her left hip that was severe. She was unable to get up without assistance. She had to wait for her husband returned from walking her dog for help. She has very severe pain in the left lateral with any sort of movement but feels relatively comfortable  when resting still. Last few days she has felt a little fatigued and has had a mild cough but has otherwise been feeling well. She denies dysuria, increased urinary frequency or urgency, or fevers.   Hospital Course:  Acute left proximal femur fracture -s/p fall -Orthopedic surgery consulted and appreciated, status post left hip hemiarthroplasty -Patient uses a walker at baseline -PT recommended SNF- pending insurance authorization -Continue pain control- Tylenol or oxycodone -Follow up with Dr. Griffin Basil in one week -ortho recommendations: WBAT LLE with posterior hip precautions. Insicional and dressing care: Ideally aquacel to be left in place until followup in 1 week.  Since patient having diarrhea may require incisional check if anything on dressing.  I replaced dressing 4/8 at 1345 myself.  If any significant spotting on dressing needs to be changed by SNF or RN Orthopedic device(s): Abduction pillow or just pillows between legs while sleeping.  Leukocytosis  -thought to be reactive  -WBC improving 12.5 (from 19.2) -UA rare bacteria, 6-30 WBC, moderate leukocytes -CXR on admission showed mild interstitial pulmonary edema, right basilar opacity possibly atelectasis -repeat CXR shows possible pneumonia- however no cough or SOB -Repeat CBC in one week  UTI -UA as above, urine culture >100K Ecoli, pansensitive  -Completed ceftriaxone course during hospitalization   Chronic atrial fibrillation -On Coumadin, on admission, patient was given 5 mg of IV vitamin K for reversal of INR 2.06 -Coumadin has been restarted, currently INR 1.7; continue bridging with full dose Lovenox for another 2 days and recheck INR  -CHADSVASC 4 -Currently on diltiazem and metoprolol   Diabetes mellitus, type II with neuropathy -was placed on Lantus, insulin sliding scale CBG monitoring during hospitalization -Home oral medications held- continue upon  discharge  -Hemoglobin A1c 7.1 -Currently on  gabapentin, may need to be tapered as this currently does not appear to be helping her and may be contributing to her lightheadedness    Anemia of acute blood loss -Likely secondary to recent surgery -Hemoglobin currently 10.4 baseline appears to be approximately 14 -repeat CBC in one week  Essential hypertension -Continue metoprolol and diltiazem  COPD -appears to be stable, continue symbicort  GERD -Continue PPI   Anxiety -Continue Ativan as needed  Bladder spasms -Continue oxybutynin  Glaucoma -continue eyedrops  Chronic kidney disease, stage III -Creatinine appears to be stable, continue to monitor BMP  Consultants Orthopedic surgery  Procedures  Left hip hemiarthroplasty  Discharge Exam: Vitals:   01/10/18 1254 01/10/18 1944  BP: (!) 120/49 (!) 110/49  Pulse: 84 90  Resp: 16 14  Temp:  98.2 F (36.8 C)  SpO2: 96% 95%   Patient seen and examined. Denies current chest pain, shortness breath, abdominal pain, nausea vomiting, diarrhea or constipation.   General: Well developed, well nourished, NAD  HEENT: NCAT, mucous membranes moist.  Cardiovascular: S1 S2 auscultated, irregular, no murmur  Respiratory: Clear to auscultation bilaterally with equal chest rise  Abdomen: Soft, nontender, nondistended, + bowel sounds  Extremities: warm dry without cyanosis clubbing or edema  Neuro: AAOx3, nonfocal  Psych: Normal affect and demeanor  Discharge Instructions Discharge Instructions    Discharge instructions   Complete by:  As directed    Patient will be discharged to skilled nursing facility.   Continue physical and occupational therapy.  Check INR within 2 days, 01/13/2018. Repeat CBC and BMP in one week. Patient will need to follow up with primary care provider within one week of discharge.   Follow-up with orthopedics, Dr.Varkey, in one week. Patient should continue medications as prescribed.   Patient should follow a heart healthy/carb  modified diet.     Allergies as of 01/11/2018      Reactions   Lactose Intolerance (gi) Nausea And Vomiting   severe stomach pain   Penicillins Hives, Itching   "haven't used any since 1970's" "haven't used any since 1970's"   Sulfa Antibiotics Hives, Itching   "haven't used any since 1970's"   Ciprofloxacin Swelling   Sulfasalazine Hives, Itching   "haven't used any since 1970's"      Medication List    STOP taking these medications   HYDROcodone-acetaminophen 5-325 MG tablet Commonly known as:  NORCO/VICODIN     TAKE these medications   acetaminophen 500 MG tablet Commonly known as:  TYLENOL Take 2 tablets (1,000 mg total) by mouth every 8 (eight) hours for 14 days.   dicyclomine 20 MG tablet Commonly known as:  BENTYL TAKE 1/2 TABLET BY MOUTH EVERY 6 HOURS AS NEEDED FOR SPASMS(ABDOMINAL PAIN)   diltiazem 120 MG 24 hr capsule Commonly known as:  DILT-XR TAKE 1 CAPSULE(120 MG) BY MOUTH DAILY   docusate sodium 100 MG capsule Commonly known as:  COLACE Take 1 capsule (100 mg total) by mouth 2 (two) times daily.   enoxaparin 80 MG/0.8ML injection Commonly known as:  LOVENOX Inject 0.65 mLs (65 mg total) into the skin every 12 (twelve) hours for 2 days.   famotidine 20 MG tablet Commonly known as:  PEPCID Take 20 mg by mouth 2 (two) times daily.   feeding supplement (GLUCERNA SHAKE) Liqd Take 237 mLs by mouth 3 (three) times daily between meals.   gabapentin 100 MG capsule Commonly known as:  NEURONTIN Take 1 pill twice  daily for 3 days, then 1 pill daily for 3 days and stop. What changed:    medication strength  how much to take  how to take this  when to take this  additional instructions   glimepiride 4 MG tablet Commonly known as:  AMARYL Take 4 mg by mouth daily with breakfast.   LANTUS SOLOSTAR 100 UNIT/ML Solostar Pen Generic drug:  Insulin Glargine Inject 22 Units into the skin daily.   latanoprost 0.005 % ophthalmic solution Commonly  known as:  XALATAN Place 1 drop into both eyes at bedtime.   loperamide 2 MG tablet Commonly known as:  LOPERAMIDE A-D Take 1 tablet (2 mg total) by mouth as needed for diarrhea or loose stools (use at bedtime). What changed:  when to take this   LORazepam 1 MG tablet Commonly known as:  ATIVAN Take 1 tablet (1 mg total) by mouth 3 (three) times daily.   methylphenidate 5 MG tablet Commonly known as:  RITALIN Take 1 tablet (5 mg total) by mouth See admin instructions. take 5mg  in the morning daily and then 5mg  twice a day on every other day   metoprolol tartrate 25 MG tablet Commonly known as:  LOPRESSOR TAKE 1 TABLET(25 MG) BY MOUTH TWICE DAILY WITH FOOD   mirtazapine 7.5 MG tablet Commonly known as:  REMERON TAKE 1 TABLET(7.5 MG) BY MOUTH AT BEDTIME   multivitamin with minerals Tabs tablet Take 1 tablet by mouth daily.   omeprazole 20 MG capsule Commonly known as:  PRILOSEC Take 20 mg by mouth 2 (two) times daily.   oxybutynin 5 MG tablet Commonly known as:  DITROPAN Take 1 tablet by mouth at bedtime.   oxyCODONE 5 MG immediate release tablet Commonly known as:  Oxy IR/ROXICODONE Take 1 pills every 6 hrs as needed for pain   promethazine 25 MG tablet Commonly known as:  PHENERGAN Take 25 mg by mouth 3 (three) times daily as needed for nausea or vomiting.   sucralfate 1 g tablet Commonly known as:  CARAFATE Take 1 g by mouth 2 (two) times daily.   SYMBICORT 160-4.5 MCG/ACT inhaler Generic drug:  budesonide-formoterol Inhale 2 puffs into the lungs 2 (two) times daily.   timolol 0.5 % ophthalmic solution Commonly known as:  TIMOPTIC   warfarin 5 MG tablet Commonly known as:  COUMADIN Take 0.5-1 tablets (2.5-5 mg total) by mouth daily. Take 1.25mg  every Sun/Wed then 2.5 mg the rest of the week Start taking on:  01/12/2018      Allergies  Allergen Reactions  . Lactose Intolerance (Gi) Nausea And Vomiting    severe stomach pain  . Penicillins Hives and  Itching    "haven't used any since 1970's" "haven't used any since 1970's"  . Sulfa Antibiotics Hives and Itching    "haven't used any since 1970's"  . Ciprofloxacin Swelling  . Sulfasalazine Hives and Itching    "haven't used any since 1970's"    Contact information for follow-up providers    Hiram Gash, MD Follow up in 1 week(s).   Specialty:  Orthopedic Surgery Contact information: [address] [place] [address] [phone]        [name], MD. Schedule an appointment as soon as possible for a visit in 1 week(s).   Specialty:  Internal Medicine Why:  Hospital follow up Contact information: [place] [address] [phone]            Contact information for after-discharge care  Destination    [facility] .   Service:  Skilled Nursing Contact information: [place] [phone]                   The results of significant diagnostics from this hospitalization (including imaging, microbiology, ancillary and laboratory) are listed below for reference.    Significant Diagnostic Studies: Ct Head Wo Contrast  Result Date: 01/06/2018 CLINICAL DATA:  Head trauma, patient on anticoagulation EXAM: CT HEAD WITHOUT CONTRAST TECHNIQUE: Contiguous axial images were obtained from the base of the skull through the vertex without intravenous contrast. COMPARISON:  None. FINDINGS: Brain: No evidence of acute infarction, hemorrhage, extra-axial collection, ventriculomegaly, or mass effect. Generalized cerebral atrophy. Periventricular white matter low attenuation likely secondary to microangiopathy. Vascular: Cerebrovascular atherosclerotic calcifications are noted. Skull: Negative for fracture or focal lesion. Sinuses/Orbits: Visualized portions of the orbits are unremarkable. Visualized portions of the paranasal sinuses and mastoid air cells are unremarkable.  Other: None. IMPRESSION: No acute intracranial pathology. Electronically Signed   By: Kathreen Devoid   On: 01/06/2018 16:22   Pelvis Portable  Result Date: 01/07/2018 CLINICAL DATA:  Left hip hemiarthroplasty. EXAM: PORTABLE PELVIS 1-2 VIEWS COMPARISON:  CT left hip from yesterday. FINDINGS: The left hip demonstrates a hemiarthroplasty without evidence of hardware failure or complication. There is expected intra-articular air. There is no fracture or dislocation. The alignment is anatomic. Post-surgical changes noted in the surrounding soft tissues. IMPRESSION: Interval left hip hemiarthroplasty without evidence of acute postoperative complication. Electronically Signed   By: Titus Dubin M.D.   On: 01/07/2018 19:17   Ct Hip Left Wo Contrast  Result Date: 01/06/2018 CLINICAL DATA:  Left hip fracture. EXAM: CT OF THE LEFT HIP WITHOUT CONTRAST TECHNIQUE: Multidetector CT imaging of the left hip was performed according to the standard protocol. Multiplanar CT image reconstructions were also generated. COMPARISON:  Left hip x-rays from same day. FINDINGS: Bones/Joint/Cartilage Acute transcervical fracture of the left femoral neck with apex anterior angulation. No additional fracture seen. No dislocation. Osteopenia. Small left hip joint effusion. Ligaments Suboptimally assessed by CT. Muscles and Tendons Mild atrophy of the gluteal muscles. Visualized tendons are grossly unremarkable. Soft tissues No hematoma.  Sigmoid diverticulosis. IMPRESSION: 1. Acute transcervical fracture of the left femoral neck. Electronically Signed   By: Titus Dubin M.D.   On: 01/06/2018 23:16   Dg Chest Port 1 View  Result Date: 01/09/2018 CLINICAL DATA:  Leukocytosis. EXAM: PORTABLE CHEST 1 VIEW COMPARISON:  January 06, 2018 FINDINGS: The elevated right hemidiaphragm persists. Superimposed opacity is unchanged since January 06, 2018 but new compared to more remote studies. The heart, hila, mediastinum, lungs, and pleura are otherwise  unchanged. IMPRESSION: Opacity projected over the elevated right hemidiaphragm may represent atelectasis or pneumonia. Recommend follow-up to resolution. Electronically Signed   By: Dorise Bullion III M.D   On: 01/09/2018 15:42   Chest Portable 1 View  Result Date: 01/06/2018 CLINICAL DATA:  84 y/o  F; dry cough and fatigue. EXAM: PORTABLE CHEST 1 VIEW COMPARISON:  03/10/2014 chest radiograph. FINDINGS: Stable cardiac silhouette given projection and technique. Aortic atherosclerosis with calcification. Elevated right hemidiaphragm with streaky right basilar opacity. Reticular and peripheral linear opacities at the left lung base. No acute osseous abnormality is evident. IMPRESSION: 1. Elevated right hemidiaphragm and streaky right basilar opacity probably representing atelectasis. 2. Mild interstitial pulmonary edema. 3. Aortic atherosclerosis. Electronically Signed   By: Kristine Garbe M.D.   On: 01/06/2018  18:30   Dg Hip Unilat W Or Wo Pelvis 2-3 Views Left  Result Date: 01/06/2018 CLINICAL DATA:  84 y/o  F; severe left hip pain after fall. EXAM: DG HIP (WITH OR WITHOUT PELVIS) 2-3V LEFT COMPARISON:  None. FINDINGS: Acute fracture of the left proximal femur which appears to be a basicervical neck fracture, possibly extending into the greater trochanter. Coxa vara angulation. Hip joints are maintained. No pelvic fracture or diastasis. IMPRESSION: Acute left proximal femur fracture which appears to be a basicervical neck fracture, possibly extending into the greater trochanter. Coxa vara angulation. Electronically Signed   By: Kristine Garbe M.D.   On: 01/06/2018 15:26    Microbiology: Recent Results (from the past 240 hour(s))  Culture, Urine     Status: Abnormal   Collection Time: 01/06/18  7:24 PM  Result Value Ref Range Status   Specimen Description   Final    URINE, CLEAN CATCH Performed at [facility] [address] [place], [place] [address]     Special Requests   Final    NONE Performed at [facility], [place] [address], [place], [place] [address]    Culture >=100,000 COLONIES/mL ESCHERICHIA COLI (A)  Final   Report Status 01/08/2018 FINAL  Final   Organism ID, Bacteria ESCHERICHIA COLI (A)  Final      Susceptibility   Escherichia coli - MIC*    AMPICILLIN 8 SENSITIVE Sensitive     CEFAZOLIN <=4 SENSITIVE Sensitive     CEFTRIAXONE <=1 SENSITIVE Sensitive     CIPROFLOXACIN <=0.25 SENSITIVE Sensitive     GENTAMICIN <=1 SENSITIVE Sensitive     IMIPENEM <=0.25 SENSITIVE Sensitive     NITROFURANTOIN <=16 SENSITIVE Sensitive     TRIMETH/SULFA <=20 SENSITIVE Sensitive     AMPICILLIN/SULBACTAM <=2 SENSITIVE Sensitive     PIP/TAZO <=4 SENSITIVE Sensitive     Extended ESBL NEGATIVE Sensitive     * >=100,000 COLONIES/mL ESCHERICHIA COLI  Surgical pcr screen     Status: None   Collection Time: 01/07/18  9:38 AM  Result Value Ref Range Status   MRSA, PCR NEGATIVE NEGATIVE Final   Staphylococcus aureus NEGATIVE NEGATIVE Final    Comment: (NOTE) The Xpert SA Assay (FDA approved for NASAL specimens in patients 55 years of age and older), is one component of a comprehensive surveillance program. It is not intended to diagnose infection nor to guide or monitor treatment. Performed at [facility], [place] [address] [place], [place] [address]      Labs: Basic Metabolic Panel: Recent Labs  Lab 01/07/18 1123 01/08/18 0443 01/09/18 0903 01/10/18 0622 01/11/18 0806  NA 136 136 134* 140 140  K 3.7 3.7 4.0 2.9* 3.3*  CL 100* 101 100* 105 109  CO2 22 20* 23 23 22   GLUCOSE 195* 225* 219* 84 143*  BUN 9 19 23* 16 13  CREATININE 0.85 0.96 1.04* 0.82 0.80  CALCIUM 8.9 8.7* 8.7* 8.3* 8.3*   Liver Function Tests: Recent Labs  Lab 01/09/18 0903  AST 40  ALT 5*  ALKPHOS 60  BILITOT 1.2  PROT 6.1*  ALBUMIN 3.0*   No results for input(s): LIPASE, AMYLASE in the last 168 hours. No results for input(s):  AMMONIA in the last 168 hours. CBC: Recent Labs  Lab 01/06/18 1541 01/07/18 1123 01/08/18 0443 01/09/18 0903 01/10/18 0622 01/10/18 1512  WBC 19.2* 13.3* 11.6* 15.0* 12.5*  --   NEUTROABS 15.9*  --   --   --   --   --  HGB 15.5* 14.6 11.9* 10.8* 10.2* 10.4*  HCT 45.5 43.6 36.8 32.9* 31.9* 32.5*  MCV 91.7 91.0 92.9 92.4 93.3  --   PLT 252 232 236 221 221  --    Cardiac Enzymes: No results for input(s): CKTOTAL, CKMB, CKMBINDEX, TROPONINI in the last 168 hours. BNP: BNP (last 3 results) No results for input(s): BNP in the last 8760 hours.  ProBNP (last 3 results) No results for input(s): PROBNP in the last 8760 hours.  CBG: Recent Labs  Lab 01/10/18 1252 01/10/18 1632 01/10/18 2054 01/10/18 2135 01/11/18 0201  GLUCAP 249* 193* 167* 166* 117*       Signed:  Traveion Ruddock  [facility] 01/11/2018, 10:36 AM

## 2018-01-11 NOTE — Progress Notes (Signed)
RN called [facility] and gave report to [employer] pt comfortable and awaiting PTAR will transport at 130 to Room 101A

## 2018-01-11 NOTE — Progress Notes (Signed)
ANTICOAGULATION CONSULT NOTE - f/u Consult  Pharmacy Consult for Coumadin Indication: atrial fibrillation  Allergies  Allergen Reactions  . Lactose Intolerance (Gi) Nausea And Vomiting    severe stomach pain  . Penicillins Hives and Itching    "haven't used any since 1970's" "haven't used any since 1970's"  . Sulfa Antibiotics Hives and Itching    "haven't used any since 1970's"  . Ciprofloxacin Swelling  . Sulfasalazine Hives and Itching    "haven't used any since 1970's"    Patient Measurements: Height: 5' (152.4 cm) Weight: 139 lb (63 kg) IBW/kg (Calculated) : 45.5  Vital Signs:    Labs: Recent Labs    01/09/18 0903 01/10/18 0622 01/10/18 1512 01/11/18 0518 01/11/18 0806  HGB 10.8* 10.2* 10.4*  --   --   HCT 32.9* 31.9* 32.5*  --   --   PLT 221 221  --   --   --   LABPROT 15.3* 16.5*  --  20.0*  --   INR 1.22 1.34  --  1.72  --   CREATININE 1.04* 0.82  --   --  0.80    Estimated Creatinine Clearance: 42.6 mL/min (by C-G formula based on SCr of 0.8 mg/dL).  CC/HPI: hip fx after fall. THA 4/5 PM.  PMH: Afib, DM, HTN, HLD, COPD, h/o cdiff 2015, carotid stenosis, chronic low back pain, COPD, depression, GERD, glaucoma, HLD, HTN, neuropathy  Anticoag: Afib. CHA2DS2-VASc 4. INR 1.34>1.72 - 4/4: Vitamin K 5mg  IV x 1 - PTA warfarin 2.5mg  daily except 1.25mg  SunWed for afib. Admit INR 2.06  Renal: Scr 0.8  Heme/Onc: H&H 10.4/32.5, Plt 221  Goal of Therapy:  INR 2-3 Monitor platelets by anticoagulation protocol: Yes   Plan:  Lovenox 65mg  BID  Coumadin 4 mg po x 1 tonight  Daily INR  Levester Fresh, PharmD, BCPS, BCCCP Clinical Pharmacist Clinical phone for 01/11/2018 from 7a-3:30p: [phone] If after 3:30p, please call main pharmacy at: [phone] 01/11/2018 10:17 AM

## 2018-01-12 ENCOUNTER — Encounter (HOSPITAL_COMMUNITY): Payer: Self-pay | Admitting: Orthopaedic Surgery

## 2018-01-14 DIAGNOSIS — S72002D Fracture of unspecified part of neck of left femur, subsequent encounter for closed fracture with routine healing: Secondary | ICD-10-CM | POA: Diagnosis not present

## 2018-01-15 DIAGNOSIS — I482 Chronic atrial fibrillation: Secondary | ICD-10-CM | POA: Diagnosis not present

## 2018-01-15 DIAGNOSIS — M545 Low back pain: Secondary | ICD-10-CM | POA: Diagnosis not present

## 2018-01-15 DIAGNOSIS — Z7901 Long term (current) use of anticoagulants: Secondary | ICD-10-CM | POA: Diagnosis not present

## 2018-01-15 DIAGNOSIS — J449 Chronic obstructive pulmonary disease, unspecified: Secondary | ICD-10-CM | POA: Diagnosis not present

## 2018-01-15 DIAGNOSIS — Z96642 Presence of left artificial hip joint: Secondary | ICD-10-CM | POA: Diagnosis not present

## 2018-01-15 DIAGNOSIS — E1151 Type 2 diabetes mellitus with diabetic peripheral angiopathy without gangrene: Secondary | ICD-10-CM | POA: Diagnosis not present

## 2018-01-17 ENCOUNTER — Other Ambulatory Visit: Payer: Self-pay | Admitting: *Deleted

## 2018-01-17 NOTE — Patient Outreach (Signed)
[place]) Care Management  01/17/2018  [name] [name] July 02, 1930 [id number]   CSW was able to make initial contact with patient t on 01/14/2018 at [facility]. CSW obtained HIPAA compliant identifiers from patient, which included patient's name, address and date of birth. CSW introduced self, explained role and reason for vist; SNF admit post hospitalization.  Per pt, she had a fall and broke her hip. Husband, [name], states, "they did a partial total hip".   CSW explained services that [facility] can provide and left a [facility] Welcome packet with pt at SNF.   Pt anticipates return to home once stable.  CSW plans f/u visit at SNF for further determination of needs and interest in participating in [facility] program.   Eduard Clos, MSW, [place] Worker  [place] [phone]

## 2018-01-20 ENCOUNTER — Other Ambulatory Visit: Payer: Self-pay | Admitting: *Deleted

## 2018-01-20 NOTE — Patient Outreach (Signed)
[place]) Care Management  01/20/2018  [place] Nishi 02/16/1930 [id number]   CSW visited pt at [facility] where she continues to rehabiliate. Per SN rep, she is ambulating over 100 feet with PT and is expected to dc home with [facility] and family support in the next week.  Pt reports her hip is doing well; she also reports having DM, COPD and AFIB. Pt reminded of [facility] benefits and services and packet provided. CSW will plan followup call or visit next week for SNF updates on progress and dc plans/disposition.    Eduard Clos, MSW, [place] Worker  [place] [phone]

## 2018-01-24 ENCOUNTER — Other Ambulatory Visit: Payer: Self-pay | Admitting: *Deleted

## 2018-01-26 ENCOUNTER — Other Ambulatory Visit: Payer: Self-pay | Admitting: *Deleted

## 2018-01-26 NOTE — Patient Outreach (Signed)
[place]) Care Management  01/26/2018  [name] [name] June 05, 1930 [id number]   Pt remains at [facility]- she is progressing with PT per SNF rep and dc home is anticipated next week.  Pt is eager to get home to her dog. CSW will plan f/u call or visit to SNF next week.   Eduard Clos, MSW, [place] Worker  [place] [phone]

## 2018-01-30 DIAGNOSIS — S7292XD Unspecified fracture of left femur, subsequent encounter for closed fracture with routine healing: Secondary | ICD-10-CM | POA: Diagnosis not present

## 2018-01-30 DIAGNOSIS — I482 Chronic atrial fibrillation: Secondary | ICD-10-CM | POA: Diagnosis not present

## 2018-01-30 DIAGNOSIS — R410 Disorientation, unspecified: Secondary | ICD-10-CM | POA: Diagnosis not present

## 2018-01-31 ENCOUNTER — Other Ambulatory Visit: Payer: Self-pay | Admitting: *Deleted

## 2018-02-01 DIAGNOSIS — S72042D Displaced fracture of base of neck of left femur, subsequent encounter for closed fracture with routine healing: Secondary | ICD-10-CM | POA: Diagnosis not present

## 2018-02-01 DIAGNOSIS — E1122 Type 2 diabetes mellitus with diabetic chronic kidney disease: Secondary | ICD-10-CM | POA: Diagnosis not present

## 2018-02-01 DIAGNOSIS — B9620 Unspecified Escherichia coli [E. coli] as the cause of diseases classified elsewhere: Secondary | ICD-10-CM | POA: Diagnosis not present

## 2018-02-01 DIAGNOSIS — J449 Chronic obstructive pulmonary disease, unspecified: Secondary | ICD-10-CM | POA: Diagnosis not present

## 2018-02-01 DIAGNOSIS — E1140 Type 2 diabetes mellitus with diabetic neuropathy, unspecified: Secondary | ICD-10-CM | POA: Diagnosis not present

## 2018-02-01 DIAGNOSIS — D631 Anemia in chronic kidney disease: Secondary | ICD-10-CM | POA: Diagnosis not present

## 2018-02-01 DIAGNOSIS — I129 Hypertensive chronic kidney disease with stage 1 through stage 4 chronic kidney disease, or unspecified chronic kidney disease: Secondary | ICD-10-CM | POA: Diagnosis not present

## 2018-02-01 DIAGNOSIS — N390 Urinary tract infection, site not specified: Secondary | ICD-10-CM | POA: Diagnosis not present

## 2018-02-01 DIAGNOSIS — N183 Chronic kidney disease, stage 3 (moderate): Secondary | ICD-10-CM | POA: Diagnosis not present

## 2018-02-01 NOTE — Patient Outreach (Signed)
[facility]) Care Management  02/01/2018  Zuleica Seith [birth year] [id number]   CSW spoke with pt's husband by phone who reports they came home from SNF yesterday by car and doing well. He reports [facility] staff have just left and he feels he needs some time to "breath" given all the commotion, calls and people involved.  CSW advised husband of [facility]'s program and role and he is agreeable to RN phone follow up; "but give [place] some time to catch our breath".  CSW offered to call next week to see how things are going and for possible [facility] referral.    At this time, no CSW needs identified s/p SNF.   Eduard Clos, MSW, [place] Worker  [place] [phone]

## 2018-02-02 NOTE — Progress Notes (Signed)
This encounter was created in error - please disregard.

## 2018-02-04 ENCOUNTER — Other Ambulatory Visit: Payer: Self-pay | Admitting: *Deleted

## 2018-02-04 DIAGNOSIS — S72042D Displaced fracture of base of neck of left femur, subsequent encounter for closed fracture with routine healing: Secondary | ICD-10-CM | POA: Diagnosis not present

## 2018-02-04 DIAGNOSIS — N390 Urinary tract infection, site not specified: Secondary | ICD-10-CM | POA: Diagnosis not present

## 2018-02-04 DIAGNOSIS — I129 Hypertensive chronic kidney disease with stage 1 through stage 4 chronic kidney disease, or unspecified chronic kidney disease: Secondary | ICD-10-CM | POA: Diagnosis not present

## 2018-02-04 DIAGNOSIS — N183 Chronic kidney disease, stage 3 (moderate): Secondary | ICD-10-CM | POA: Diagnosis not present

## 2018-02-04 DIAGNOSIS — B9620 Unspecified Escherichia coli [E. coli] as the cause of diseases classified elsewhere: Secondary | ICD-10-CM | POA: Diagnosis not present

## 2018-02-04 DIAGNOSIS — E1122 Type 2 diabetes mellitus with diabetic chronic kidney disease: Secondary | ICD-10-CM | POA: Diagnosis not present

## 2018-02-04 NOTE — Patient Outreach (Signed)
[facility]) Care Management  02/04/2018  [name] [name] 1930/09/16 [id number]   Referral received from LCSW as member was recently discharged from SNF.  She was recently in the hospital after a fall requiring hip surgery.  Per chart, she also has history of COPD, diabetes, and atrial fibrillation. Call placed to husband/caregiver for transition of care.  He report they have the home health nurse present at this time, sate he will call this care manager back.  Will await call back.  If no call back within the next 3 business days, will make 2nd attempt for engagement.  [name], [place], MSN [place] [phone]

## 2018-02-07 ENCOUNTER — Ambulatory Visit: Payer: Self-pay | Admitting: *Deleted

## 2018-02-07 ENCOUNTER — Other Ambulatory Visit: Payer: Self-pay | Admitting: *Deleted

## 2018-02-07 DIAGNOSIS — E1122 Type 2 diabetes mellitus with diabetic chronic kidney disease: Secondary | ICD-10-CM | POA: Diagnosis not present

## 2018-02-07 DIAGNOSIS — N390 Urinary tract infection, site not specified: Secondary | ICD-10-CM | POA: Diagnosis not present

## 2018-02-07 DIAGNOSIS — B9620 Unspecified Escherichia coli [E. coli] as the cause of diseases classified elsewhere: Secondary | ICD-10-CM | POA: Diagnosis not present

## 2018-02-07 DIAGNOSIS — N183 Chronic kidney disease, stage 3 (moderate): Secondary | ICD-10-CM | POA: Diagnosis not present

## 2018-02-07 DIAGNOSIS — I129 Hypertensive chronic kidney disease with stage 1 through stage 4 chronic kidney disease, or unspecified chronic kidney disease: Secondary | ICD-10-CM | POA: Diagnosis not present

## 2018-02-07 DIAGNOSIS — S72042D Displaced fracture of base of neck of left femur, subsequent encounter for closed fracture with routine healing: Secondary | ICD-10-CM | POA: Diagnosis not present

## 2018-02-07 NOTE — Patient Outreach (Signed)
[facility]) Care Management  02/07/2018  [name] [name] [birth year] [id number]   CSW attempted phone outreach today and was unsuccessful in reaching pt.  CSW left a HIPPA compliant voice message and will plan outreach call again in 2-3 days.   Eduard Clos, MSW, [place] Worker  [place] [phone]

## 2018-02-08 ENCOUNTER — Other Ambulatory Visit: Payer: Self-pay | Admitting: *Deleted

## 2018-02-08 DIAGNOSIS — I7389 Other specified peripheral vascular diseases: Secondary | ICD-10-CM | POA: Diagnosis not present

## 2018-02-08 DIAGNOSIS — M25552 Pain in left hip: Secondary | ICD-10-CM | POA: Diagnosis not present

## 2018-02-08 DIAGNOSIS — I10 Essential (primary) hypertension: Secondary | ICD-10-CM | POA: Diagnosis not present

## 2018-02-08 DIAGNOSIS — Z6826 Body mass index (BMI) 26.0-26.9, adult: Secondary | ICD-10-CM | POA: Diagnosis not present

## 2018-02-08 DIAGNOSIS — Z7901 Long term (current) use of anticoagulants: Secondary | ICD-10-CM | POA: Diagnosis not present

## 2018-02-08 DIAGNOSIS — E1151 Type 2 diabetes mellitus with diabetic peripheral angiopathy without gangrene: Secondary | ICD-10-CM | POA: Diagnosis not present

## 2018-02-08 DIAGNOSIS — I482 Chronic atrial fibrillation: Secondary | ICD-10-CM | POA: Diagnosis not present

## 2018-02-08 DIAGNOSIS — R2681 Unsteadiness on feet: Secondary | ICD-10-CM | POA: Diagnosis not present

## 2018-02-08 NOTE — Patient Outreach (Signed)
[place] [facility]) Care Management  02/08/2018  [name] [name] 08-26-1930 [id number]   2nd attempt made to contact member & caregiver for transition of care, unsuccessful.  HIPAA compliant voice message left.  Outreach letter sent, will follow up within the next 4 business days.  [name], [place], MSN [place] [phone]

## 2018-02-09 ENCOUNTER — Encounter: Payer: Self-pay | Admitting: *Deleted

## 2018-02-09 ENCOUNTER — Other Ambulatory Visit: Payer: Self-pay | Admitting: *Deleted

## 2018-02-09 DIAGNOSIS — S72042D Displaced fracture of base of neck of left femur, subsequent encounter for closed fracture with routine healing: Secondary | ICD-10-CM | POA: Diagnosis not present

## 2018-02-09 DIAGNOSIS — B9620 Unspecified Escherichia coli [E. coli] as the cause of diseases classified elsewhere: Secondary | ICD-10-CM | POA: Diagnosis not present

## 2018-02-09 DIAGNOSIS — E1122 Type 2 diabetes mellitus with diabetic chronic kidney disease: Secondary | ICD-10-CM | POA: Diagnosis not present

## 2018-02-09 DIAGNOSIS — N183 Chronic kidney disease, stage 3 (moderate): Secondary | ICD-10-CM | POA: Diagnosis not present

## 2018-02-09 DIAGNOSIS — I129 Hypertensive chronic kidney disease with stage 1 through stage 4 chronic kidney disease, or unspecified chronic kidney disease: Secondary | ICD-10-CM | POA: Diagnosis not present

## 2018-02-09 DIAGNOSIS — N390 Urinary tract infection, site not specified: Secondary | ICD-10-CM | POA: Diagnosis not present

## 2018-02-09 NOTE — Patient Outreach (Signed)
[facility] [facility]) Care Management  02/09/2018  Ronnette Rump 09-10-1930 [id number]   CSW spoke with pt and husband by phone today. CSW explained at length to them the difference between [facility] (She is getting [facility] care) and our [facility] program.  Pt has DM, COPD, afib and is home after being released from [facility] rehab after hip fx.    [name] is feeling a bit overwhelmed and out of control with his managing and understanding of her RX regimen; stating it has been changed a few times recently and there are so many.  CSW discussed a referral for [facility] to contact them to plan a visit to help him in management and overall organization and understanding of her RX's.   CSW will also place order for [facility] RN to reach out to pt by phone for post SNF follow up given her chronic diseases and for further assessment of nursing needs.   Husband reports they have no needs or concerns at this time for CSW.  CSW offered to submit request for [facility] for pt and they are agreeable to this.  CSW will also plan a f/u call in 2-3 weeks for update on her status/progress and to assess for CSW needs.   Eduard Clos, MSW, [place] Worker  [place] [phone]

## 2018-02-09 NOTE — Telephone Encounter (Signed)
This encounter was created in error - please disregard.

## 2018-02-10 ENCOUNTER — Other Ambulatory Visit: Payer: Self-pay | Admitting: Pharmacist

## 2018-02-10 ENCOUNTER — Other Ambulatory Visit: Payer: Self-pay

## 2018-02-10 DIAGNOSIS — B9620 Unspecified Escherichia coli [E. coli] as the cause of diseases classified elsewhere: Secondary | ICD-10-CM | POA: Diagnosis not present

## 2018-02-10 DIAGNOSIS — I129 Hypertensive chronic kidney disease with stage 1 through stage 4 chronic kidney disease, or unspecified chronic kidney disease: Secondary | ICD-10-CM | POA: Diagnosis not present

## 2018-02-10 DIAGNOSIS — S72042D Displaced fracture of base of neck of left femur, subsequent encounter for closed fracture with routine healing: Secondary | ICD-10-CM | POA: Diagnosis not present

## 2018-02-10 DIAGNOSIS — N183 Chronic kidney disease, stage 3 (moderate): Secondary | ICD-10-CM | POA: Diagnosis not present

## 2018-02-10 DIAGNOSIS — E1122 Type 2 diabetes mellitus with diabetic chronic kidney disease: Secondary | ICD-10-CM | POA: Diagnosis not present

## 2018-02-10 DIAGNOSIS — N390 Urinary tract infection, site not specified: Secondary | ICD-10-CM | POA: Diagnosis not present

## 2018-02-10 NOTE — Patient Outreach (Signed)
[facility]) Care Management  02/10/2018  [name] [name] 12/28/1929 [id number]   BSW arranged meals through [facility] benefit with [employer]. Representative estimated patient's meals to arrive next week on 02/20/18.  Daneen Schick, BSW, CDP Social Worker Cell # [phone] [email or web address]

## 2018-02-10 NOTE — Patient Outreach (Signed)
[place] [facility]) Care Management  02/10/2018  Pricilla Moehle [birth year] [id number]  84 year old female referred to [place] Management for transition of care services  [place] services requested for medication management.  PMHx includes, but not limited to, HTN, HLD, atrial fibrillation on chronic anticoagulation, COPD, anxiety, depression, diabetes type II, CKD -III, and IBS.  Patient recently hospitalized for fall with left proximal femur fracture s/p left hip arthroplasty.  Discharged home from SNF on 01/30/2018.   Successful call placed to Ms. Sahvannah Murray-Rush's spouse this morning. HIPAA identifiers verified. Spouse is agreeable to home visit next week to review medications as he does not have time to speak with me today.    Plan: Home visit scheduled for next Tuesday, May 14th, at 10:00 AM.   Ralene Bathe, PharmD, [place] [phone]

## 2018-02-11 ENCOUNTER — Other Ambulatory Visit: Payer: Self-pay | Admitting: *Deleted

## 2018-02-11 NOTE — Patient Outreach (Addendum)
[facility]) Care Management  02/11/2018  [name] [name] 1929/11/03 [id number]   3rd attempt made to contact member & husband for transition of care unsuccessful.  HIPAA compliant voice message left.  Home visit scheduled with [facility] pharmacist next week, this care manager unable to accompany due to scheduling conflict.  Will collaborate with Pharmacist to have member call this care manager back.  If no call back by 5/17 will close case to nursing.     Update:    Call received back from husband, identify verified.  Member is heard in the background granting permission to speak with husband.  They report she is doing well, denies any concerns regarding her hip.  State she has been having problems with her stomach today, which is a recurrent issue, report taking PRN medication with some relief.    They report compliance with home health for nursing and PT, report improvement.  He state he may need some assistance with medication management, [facility] pharmacist has home visit scheduled for next week.    She has already followed up with surgeon during her stay at [place], but will have another appointment next week.  Report she had appointment with primary MD this week.  Denies any urgent concerns, home visit scheduled for within the next 2 weeks.  Advised to contact this care manager with questions.  [facility] CM Care Plan Problem One     Most Recent Value  Care Plan Problem One  Risk for readmission related to complications of hip surgery as evidenced by recent hospitalization requiring rehab stay  Role Documenting the Problem One  Care Management [place] for Problem One  Active  [facility] Long Term Goal   Member will not be readmitted to acute care facility within 31 days of discharge  [facility] Long Term Goal Start Date  02/11/18  Interventions for Problem One Long Term Goal  Discussed with member the importance of following discharge instructions, including follow up  appointments, medications, diet, and home health involvement, to decrease the risk of readmission  [facility] CM Short Term Goal #1   Member will report compliance with medications as instructed over the next 4 weeks  [facility] CM Short Term Goal #1 Start Date  02/11/18  Interventions for Short Term Goal #1  Reminded of appointment next week with pharmacist.  Advised to have medications out and ready for review/reconciliation.  [facility] CM Short Term Goal #2   Member will keep and attend appointment with surgeon within the next 2 weeks  [facility] CM Short Term Goal #2 Start Date  02/11/18  Interventions for Short Term Goal #2  Advised of importance of attending follow up appointments in effort to decrease risk of infection and promote healing.      [name], [place], MSN [place] [phone]

## 2018-02-14 DIAGNOSIS — S72042D Displaced fracture of base of neck of left femur, subsequent encounter for closed fracture with routine healing: Secondary | ICD-10-CM | POA: Diagnosis not present

## 2018-02-14 DIAGNOSIS — N183 Chronic kidney disease, stage 3 (moderate): Secondary | ICD-10-CM | POA: Diagnosis not present

## 2018-02-14 DIAGNOSIS — N390 Urinary tract infection, site not specified: Secondary | ICD-10-CM | POA: Diagnosis not present

## 2018-02-14 DIAGNOSIS — B9620 Unspecified Escherichia coli [E. coli] as the cause of diseases classified elsewhere: Secondary | ICD-10-CM | POA: Diagnosis not present

## 2018-02-14 DIAGNOSIS — E1122 Type 2 diabetes mellitus with diabetic chronic kidney disease: Secondary | ICD-10-CM | POA: Diagnosis not present

## 2018-02-14 DIAGNOSIS — I129 Hypertensive chronic kidney disease with stage 1 through stage 4 chronic kidney disease, or unspecified chronic kidney disease: Secondary | ICD-10-CM | POA: Diagnosis not present

## 2018-02-15 ENCOUNTER — Other Ambulatory Visit: Payer: Self-pay | Admitting: Pharmacist

## 2018-02-15 DIAGNOSIS — N390 Urinary tract infection, site not specified: Secondary | ICD-10-CM | POA: Diagnosis not present

## 2018-02-15 DIAGNOSIS — I129 Hypertensive chronic kidney disease with stage 1 through stage 4 chronic kidney disease, or unspecified chronic kidney disease: Secondary | ICD-10-CM | POA: Diagnosis not present

## 2018-02-15 DIAGNOSIS — N183 Chronic kidney disease, stage 3 (moderate): Secondary | ICD-10-CM | POA: Diagnosis not present

## 2018-02-15 DIAGNOSIS — S72042D Displaced fracture of base of neck of left femur, subsequent encounter for closed fracture with routine healing: Secondary | ICD-10-CM | POA: Diagnosis not present

## 2018-02-15 DIAGNOSIS — B9620 Unspecified Escherichia coli [E. coli] as the cause of diseases classified elsewhere: Secondary | ICD-10-CM | POA: Diagnosis not present

## 2018-02-15 DIAGNOSIS — E1122 Type 2 diabetes mellitus with diabetic chronic kidney disease: Secondary | ICD-10-CM | POA: Diagnosis not present

## 2018-02-15 NOTE — Patient Outreach (Signed)
[facility]) Care Management  [place]   02/15/2018  [name] [name] 10/02/1930 [id number]  84 year old female referred to [place] Management for transition of care services.  [place] services requested for medication management.  PMHx includes, but not limited to, HTN, HLD, atrial fibrillation on chronic anticoagulation, COPD, anxiety, depression, diabetes type II, CKD -III, and IBS.  Patient recently hospitalized for fall with left proximal femur fracture s/p left hip arthroplasty.  Discharged home from SNF on 01/30/2018.    Subjective: Successful home visit with [name] and spouse.  HIPAA identifiers verified.  Patient reports that yesterday was "a bad day" as she was in pain possibly related to changes in weather but she is feeling better today.  Her spouse fills a weekly pillbox for her each Saturday and states he has been doing this since 2013.  Patient currently has all medications [facility].   [facility] is working with patient. Patient reports checking her blood sugar at least once daily and that CBGs range 100-130s.      Objective:  Hemoglobin A1c = 7.1 (01/07/18)  Encounter Medications: Outpatient Encounter Medications as of 02/15/2018  Medication Sig  . Carboxymethylcellulose Sod PF (EQ RESTORE PLUS LUBRICANT EYE) 0.5 % SOLN Apply 1 drop to eye daily as needed.  . dicyclomine (BENTYL) 20 MG tablet TAKE 1/2 TABLET BY MOUTH EVERY 6 HOURS AS NEEDED FOR SPASMS(ABDOMINAL PAIN)  . diltiazem (DILT-XR) 120 MG 24 hr capsule TAKE 1 CAPSULE(120 MG) BY MOUTH DAILY  . divalproex (DEPAKOTE SPRINKLE) 125 MG capsule Take 125 mg by mouth 2 (two) times daily.  Marland Kitchen gabapentin (NEURONTIN) 100 MG capsule Take 100 mg by mouth 3 (three) times daily.  Mar[place] Kitchen glimepiride (AMARYL) 4 MG tablet Take 4 mg by mouth daily with breakfast.   . HYDROcodone-acetaminophen (NORCO/VICODIN) 5-325 MG tablet Take 1 tablet by mouth every 6 (six) hours as  needed for moderate pain.  Marland Kitchen lactose free nutrition (BOOST) LIQD Take 237 mLs by mouth as needed.  Marland Kitchen LANTUS SOLOSTAR 100 UNIT/ML Solostar Pen Inject 22 Units into the skin daily.   Marland Kitchen latanoprost (XALATAN) 0.005 % ophthalmic solution Place 1 drop into both eyes at bedtime.  Marland Kitchen loperamide (LOPERAMIDE A-D) 2 MG tablet Take 1 tablet (2 mg total) by mouth as needed for diarrhea or loose stools (use at bedtime). (Patient taking differently: Take 2 mg by mouth at bedtime. )  . LORazepam (ATIVAN) 1 MG tablet Take 1 tablet (1 mg total) by mouth 3 (three) times daily. (Patient taking differently: Take 1 mg by mouth every 8 (eight) hours as needed. )  . methylphenidate (RITALIN) 5 MG tablet Take 1 tablet (5 mg total) by mouth See admin instructions. take 5mg  in the morning daily and then 5mg  twice a day on every other day (Patient taking differently: Take 5 mg by mouth daily. take 5mg  in the morning daily and then 5mg  twice a day on every other day)  . metoprolol tartrate (LOPRESSOR) 25 MG tablet TAKE 1 TABLET(25 MG) BY MOUTH TWICE DAILY WITH FOOD  . mirtazapine (REMERON) 7.5 MG tablet TAKE 1 TABLET(7.5 MG) BY MOUTH AT BEDTIME  . Multiple Vitamin (MULTIVITAMIN WITH MINERALS) TABS Take 1 tablet by mouth daily.  Marland Kitchen omeprazole (PRILOSEC) 20 MG capsule Take 20 mg by mouth 2 (two) times daily.   . promethazine (PHENERGAN) 25 MG tablet Take 25 mg by mouth 3 (three) times daily as needed for nausea or vomiting.  . sucralfate (  CARAFATE) 1 g tablet Take 1 g by mouth 2 (two) times daily.  . SYMBICORT 160-4.5 MCG/ACT inhaler Inhale 2 puffs into the lungs 2 (two) times daily.   . timolol (TIMOPTIC) 0.5 % ophthalmic solution Place 1 drop into the right eye.   . warfarin (COUMADIN) 5 MG tablet Take 0.5-1 tablets (2.5-5 mg total) by mouth daily. Take 1.25mg  every Sun/Wed then 2.5 mg the rest of the week  . oxybutynin (DITROPAN) 5 MG tablet Take 1 tablet by mouth at bedtime.  . [DISCONTINUED] docusate sodium (COLACE) 100 MG  capsule Take 1 capsule (100 mg total) by mouth 2 (two) times daily. (Patient not taking: Reported on 02/15/2018)  . [DISCONTINUED] enoxaparin (LOVENOX) 80 MG/0.8ML injection Inject 0.65 mLs (65 mg total) into the skin every 12 (twelve) hours for 2 days.  . [DISCONTINUED] famotidine (PEPCID) 20 MG tablet Take 20 mg by mouth 2 (two) times daily.  . [DISCONTINUED] feeding supplement, GLUCERNA SHAKE, (GLUCERNA SHAKE) LIQD Take 237 mLs by mouth 3 (three) times daily between meals.  . [DISCONTINUED] gabapentin (NEURONTIN) 100 MG capsule Take 1 pill twice daily for 3 days, then 1 pill daily for 3 days and stop.   No facility-administered encounter medications on file as of 02/15/2018.     Functional Status: In your present state of health, do you have any difficulty performing the following activities: 01/07/2018 10/28/2017  Hearing? - N  Vision? - N  Difficulty concentrating or making decisions? - N  Walking or climbing stairs? - Y  Dressing or bathing? - N  Doing errands, shopping? Y N  Some recent data might be hidden    Fall/Depression Screening: No flowsheet data found. No flowsheet data found.  Assessment:  Drugs sorted by system:  Neurologic/Psychologic: depakote sprinkles, gabapentin, lorazepam, mirtazapine  Cardiovascular: diltiazem, metoprolol  Pulmonary/Allergy: symbicort  Gastrointestinal:boost liquid, loperamide, omeprazole, promethazine, sucralfate  Endocrine: glimepiride, lantus  Renal: none  Topical: restore eye drops, latanoprost eye drops, timolol eye drops  Pain: dicyclomine, hydrocodone-acetaminophen, methylphenidate  Vitamins/Minerals: MVI  Infectious Diseases: none  Miscellaneous: oxybutynin, warfarin   Duplications in therapy: none Gaps in therapy: none Medications to avoid in the elderly:  Promethazine: Promethazine, a first-generation antihistamine, is identified in the Beers Criteria as a potentially inappropriate medication to be avoided in  patients 65 years and older (independent of diagnosis or condition) due to its potent anticholinergic properties resulting in increased risk of confusion, dry mouth, constipation, and other anticholinergic effects or toxicity; use should also be avoided due to reduced clearance with advanced age and tolerance associated with use as a hypnotic.  Dicyclomine: This medication has been identified by Beers Criteria as a potentially inappropriate medication to be avoided in patients 65 years and older (independent of diagnosis or condition) due to its highly anticholinergic properties and uncertain effectiveness as an antispasmodic. Per Beers list, medications with strong anticholinergic properties should be avoided in older adults with dementia and cognitive impairment, those with or at high risk of delirium, and those with lower urinary tract symptoms including benign prostatic hyperplasia.   Glimepiride: Geriatric patients may have an increased risk of hypoglycemia. Lower dosages may be required. Per the Beers list, there is a greater risk of severe prolonged hypoglycemia in older adults using this medication. This medication should be avoided in the elderly.    Lorazepam:This drug is identified in the Beers Criteria as a potentially inappropriate medication to be avoided in patients 65 years and older (independent of diagnosis or condition) due to increased risk  of impaired cognition, delirium, falls, fractures, and motor vehicle accidents with benzodiazepine use. If a benzodiazepine is indicated, this medication may be a preferred agent to use in elderly patients because it is relatively short-acting with an inactive metabolite.   Drug interactions: none  Other issues noted:   Symbicort inhaler: patient has poor administration technique and complains that she has a hard time coordinating the inhaler actuation with her breathing.  She may benefit from use of a spacer.  I counseled patient on how to  appropriately use Symbicort and reviewed with her to rinse her mouth / brush teeth after each use.  Patient voiced understanding and is agreeable to try to use a spacer if provider agrees.    Gabapentin: Patient has 2 doses of medications (100mg  + 300mg ) with different instructions written by different providers.  She is unsure which medication she should be taking.     Oxybutynin: Patient complains of overactive bladder symptoms but is not sure if she is supposed to be taking oxybutynin.     Medication Adherence:  Medications filled by spouse in pillbox who has vision and hearing problems and states he has trouble with his memory at times.  He is using an out dated list from [facility] to fill medications but list is marked up and hard to read.  Pillbox is very old with weekdays and time of day worn off.  Spouse reports he is doing alright with the pillbox but would like an easier method if possible. I reviewed compliance packaging and showed an example of a compliance packaging system.  Patient and spouse who are agreeable to try this.  They would like to use [facility] in [place], [place].     Plan: I will contact providers about issues listed above for patient, request new medications be sent to [facility], and  follow-up with patient in the next few days.   Ralene Bathe, PharmD, [place] [phone]

## 2018-02-16 DIAGNOSIS — N183 Chronic kidney disease, stage 3 (moderate): Secondary | ICD-10-CM | POA: Diagnosis not present

## 2018-02-16 DIAGNOSIS — S72042D Displaced fracture of base of neck of left femur, subsequent encounter for closed fracture with routine healing: Secondary | ICD-10-CM | POA: Diagnosis not present

## 2018-02-16 DIAGNOSIS — N390 Urinary tract infection, site not specified: Secondary | ICD-10-CM | POA: Diagnosis not present

## 2018-02-16 DIAGNOSIS — B9620 Unspecified Escherichia coli [E. coli] as the cause of diseases classified elsewhere: Secondary | ICD-10-CM | POA: Diagnosis not present

## 2018-02-16 DIAGNOSIS — I129 Hypertensive chronic kidney disease with stage 1 through stage 4 chronic kidney disease, or unspecified chronic kidney disease: Secondary | ICD-10-CM | POA: Diagnosis not present

## 2018-02-16 DIAGNOSIS — E1122 Type 2 diabetes mellitus with diabetic chronic kidney disease: Secondary | ICD-10-CM | POA: Diagnosis not present

## 2018-02-17 ENCOUNTER — Telehealth: Payer: Self-pay | Admitting: Pharmacist

## 2018-02-17 NOTE — Patient Outreach (Signed)
[name]) Care Management  02/17/2018  Tamee Battin [birth year] [id number]   Incoming call and voicemail received 02/16/2018 from Dr. Shon Baton office to clarify medications:   Oxybutynin discontinued  Gabapentin 300mg  TID most current order  New prescriptions sent to new pharmacy, [facility], so compliance packaging and delivery services can be initiated  Care coordination call to [facility]  Pharmacy: Several new prescriptions called in by Dr. Philip Aspen however some medications on currently list in [facility] missing:   Timolol  Xalatan  PRN dicyclomine  Depakote  Lantus  Diltiazem  Ativan  Ritalin (C-II, patient will likely need to bring hard copy to pharmacy of choice to fill)  Care coordination placed to Dr. Shon Baton RN (DJ) and another message left requesting call back to review all current medications.  No other provider listed in [facility] for patient.   Plan: I will await call back from Dr. Shon Baton RN.  If I do not hear back today, I will follow-up again tomorrow with office.   Ralene Bathe, PharmD, [place] [phone]

## 2018-02-18 ENCOUNTER — Other Ambulatory Visit: Payer: Self-pay | Admitting: Pharmacist

## 2018-02-18 ENCOUNTER — Ambulatory Visit: Payer: Self-pay | Admitting: Pharmacist

## 2018-02-18 ENCOUNTER — Other Ambulatory Visit: Payer: Self-pay | Admitting: *Deleted

## 2018-02-18 DIAGNOSIS — S72002D Fracture of unspecified part of neck of left femur, subsequent encounter for closed fracture with routine healing: Secondary | ICD-10-CM | POA: Diagnosis not present

## 2018-02-18 NOTE — Patient Outreach (Addendum)
[place] [facility]) Care Management  02/18/2018  [name] [name] January 27, 1930 [id number]   Care coordination call placed to Dr. Shon Baton office regarding medications.  I faxed medication list on file in [facility] to Dr. Philip Aspen as this provider does not have access to [facility].  RN to return call or fax back updated medication list.    Plan: I will await response from provider office and communicate with [place] once up-to-date medication list confirmed.   Ralene Bathe, PharmD, [place] [phone]  Fax received from Dr. Shon Baton office.  Medications not on current list include: oxybutynin, MVI, boost, timolol eye drops, dicyclomine, divalproex sodium, and refresh eye drops.    Incoming call from patient's spouse who is concerned about patient.  He reports that yesterday and this morning, patient was saying she was too sick to get out of bed and feels "on the verge of death." This feeling slowly goes away after 15-20 minutes.  He reports that patient had an office visit with Dr. Griffin Basil earlier this morning regarding recent hip arthroplasty and no issues noted.   I recommended to patient that he call Dr. Shon Baton office for treatment recommendations regarding patient symptoms.  Patient reports only new medication is Depakote which she reports was started during recent SNF stay.    I reviewed medication list from Dr. Shon Baton office with spouse and patient.  Spouse reports patient's eye drops are prescribed by Dr. Claudean Kinds.    Plan: I left a 2nd message with Dr. Shon Baton office regarding patient symptoms and to request new medications be called in for remaining medications on patient list for compliance packaging including: diltiazem, lantus, lorazepam.   I spoke with representative with Dr. Aletha Halim office who will send in  new prescription for Timolol and Xalatan eye drops to  [facility].   I will follow-up with  patient next week unless I hear back from office beforehand.   Ralene Bathe, PharmD, [place] [phone]    Addendum: Incoming call received from Dr. Shon Baton office.  After looking into medications from SNF, RN states that dicyclomine and divalproex are now current medications.  She will send in new prescriptions for these as well as diltiazem and lantus.  She confirms patient uses paper prescriptions for control substances.  On-call MD recommended for patient to take the depakote in AM and at noon rather than QPM to hopefully reduce side effects.   RN has already spoken with patient and provided these instructions.    Plan: I will follow-up next week with [facility] regarding compliance packaging.   Ralene Bathe, PharmD, [place] [phone]

## 2018-02-19 NOTE — Patient Outreach (Signed)
[facility]) Care Management  02/19/2018  Calley Drenning 10-Nov-1929 [id number]   Weekly transition of care call placed to member/husband.  He report he does not have time to talk as they are on their way to a follow up appointment.  He state he will call this care manager back, does confirm home visit for next week.  [name], [place], MSN [place] [phone]

## 2018-02-21 ENCOUNTER — Other Ambulatory Visit: Payer: Self-pay | Admitting: Pharmacist

## 2018-02-21 ENCOUNTER — Ambulatory Visit: Payer: Self-pay | Admitting: Pharmacist

## 2018-02-21 DIAGNOSIS — D72829 Elevated white blood cell count, unspecified: Secondary | ICD-10-CM | POA: Diagnosis not present

## 2018-02-21 DIAGNOSIS — R5383 Other fatigue: Secondary | ICD-10-CM | POA: Diagnosis not present

## 2018-02-21 DIAGNOSIS — I482 Chronic atrial fibrillation: Secondary | ICD-10-CM | POA: Diagnosis not present

## 2018-02-21 DIAGNOSIS — N3281 Overactive bladder: Secondary | ICD-10-CM | POA: Diagnosis not present

## 2018-02-21 DIAGNOSIS — D6489 Other specified anemias: Secondary | ICD-10-CM | POA: Diagnosis not present

## 2018-02-21 DIAGNOSIS — N390 Urinary tract infection, site not specified: Secondary | ICD-10-CM | POA: Diagnosis not present

## 2018-02-21 DIAGNOSIS — B372 Candidiasis of skin and nail: Secondary | ICD-10-CM | POA: Insufficient documentation

## 2018-02-21 DIAGNOSIS — Z6826 Body mass index (BMI) 26.0-26.9, adult: Secondary | ICD-10-CM | POA: Diagnosis not present

## 2018-02-21 NOTE — Patient Outreach (Signed)
[facility]) Care Management  02/21/2018  [name] [name] 03-11-1930 [id number]  Care coordination call placed to [place] regarding initiation of compliance packs.  Pharmacy still needs new prescriptions for several oral medications.  They will directly request new prescriptions from Dr. Shon Baton office.    Successful call to spouse of [name].  HIPAA identifiers verified. Spouse reports that she is feeling much better today than last week.  She has stopped taking the Depakote after speaking with Dr. Philip Aspen last week and does not wish to resume this.  She will run out of gabapentin this week and will need to pick up a short-supply until the compliance packaging can begin.  Spouse verbalized understanding and reports patient has all other medications through at least next week.   Plan: I will follow-up with pharmacy on Thursday regarding compliance packaging and gabapentin for patient.   Ralene Bathe, PharmD, [place] [phone]

## 2018-02-22 ENCOUNTER — Other Ambulatory Visit: Payer: Self-pay

## 2018-02-22 DIAGNOSIS — E1122 Type 2 diabetes mellitus with diabetic chronic kidney disease: Secondary | ICD-10-CM | POA: Diagnosis not present

## 2018-02-22 DIAGNOSIS — S72042D Displaced fracture of base of neck of left femur, subsequent encounter for closed fracture with routine healing: Secondary | ICD-10-CM | POA: Diagnosis not present

## 2018-02-22 DIAGNOSIS — N183 Chronic kidney disease, stage 3 (moderate): Secondary | ICD-10-CM | POA: Diagnosis not present

## 2018-02-22 DIAGNOSIS — I129 Hypertensive chronic kidney disease with stage 1 through stage 4 chronic kidney disease, or unspecified chronic kidney disease: Secondary | ICD-10-CM | POA: Diagnosis not present

## 2018-02-22 DIAGNOSIS — N390 Urinary tract infection, site not specified: Secondary | ICD-10-CM | POA: Diagnosis not present

## 2018-02-22 DIAGNOSIS — B9620 Unspecified Escherichia coli [E. coli] as the cause of diseases classified elsewhere: Secondary | ICD-10-CM | POA: Diagnosis not present

## 2018-02-22 NOTE — Patient Outreach (Signed)
[facility]) Care Management  02/22/2018  Traniyah Hallett [birth year] [id number]  BSW received a call from patients wife stating he just found the package of meals delivered through [facility] benefit at their front door with meals already thawed. [name] able to verify patient HIPAA identifiers and had patient on speaker phone. [name] continued to say the meals were delivered to their front door and were overlooked because they do not use that door often.  BSW placed a call to [facility] to inquire on possibility of patient utilizing this benefit again. BSW placed customer service representative, Danae Chen, on a three-way call with patient and her husband. Danae Chen is re-issuing meals for patient. Danae Chen gave patient expected delivery date of 5/23. At end of call BSW reminded [name] to check all doors for package in the coming days.  Daneen Schick, BSW, CDP Social Worker Cell # [phone] [email or web address]

## 2018-02-23 DIAGNOSIS — S72042D Displaced fracture of base of neck of left femur, subsequent encounter for closed fracture with routine healing: Secondary | ICD-10-CM | POA: Diagnosis not present

## 2018-02-23 DIAGNOSIS — E1122 Type 2 diabetes mellitus with diabetic chronic kidney disease: Secondary | ICD-10-CM | POA: Diagnosis not present

## 2018-02-23 DIAGNOSIS — I129 Hypertensive chronic kidney disease with stage 1 through stage 4 chronic kidney disease, or unspecified chronic kidney disease: Secondary | ICD-10-CM | POA: Diagnosis not present

## 2018-02-23 DIAGNOSIS — N390 Urinary tract infection, site not specified: Secondary | ICD-10-CM | POA: Diagnosis not present

## 2018-02-23 DIAGNOSIS — N183 Chronic kidney disease, stage 3 (moderate): Secondary | ICD-10-CM | POA: Diagnosis not present

## 2018-02-23 DIAGNOSIS — B9620 Unspecified Escherichia coli [E. coli] as the cause of diseases classified elsewhere: Secondary | ICD-10-CM | POA: Diagnosis not present

## 2018-02-24 ENCOUNTER — Other Ambulatory Visit: Payer: Self-pay | Admitting: Pharmacist

## 2018-02-24 ENCOUNTER — Encounter: Payer: Self-pay | Admitting: *Deleted

## 2018-02-24 ENCOUNTER — Other Ambulatory Visit: Payer: Self-pay | Admitting: *Deleted

## 2018-02-24 DIAGNOSIS — B9620 Unspecified Escherichia coli [E. coli] as the cause of diseases classified elsewhere: Secondary | ICD-10-CM | POA: Diagnosis not present

## 2018-02-24 DIAGNOSIS — N390 Urinary tract infection, site not specified: Secondary | ICD-10-CM | POA: Diagnosis not present

## 2018-02-24 DIAGNOSIS — N183 Chronic kidney disease, stage 3 (moderate): Secondary | ICD-10-CM | POA: Diagnosis not present

## 2018-02-24 DIAGNOSIS — S72042D Displaced fracture of base of neck of left femur, subsequent encounter for closed fracture with routine healing: Secondary | ICD-10-CM | POA: Diagnosis not present

## 2018-02-24 DIAGNOSIS — E1122 Type 2 diabetes mellitus with diabetic chronic kidney disease: Secondary | ICD-10-CM | POA: Diagnosis not present

## 2018-02-24 DIAGNOSIS — I129 Hypertensive chronic kidney disease with stage 1 through stage 4 chronic kidney disease, or unspecified chronic kidney disease: Secondary | ICD-10-CM | POA: Diagnosis not present

## 2018-02-24 NOTE — Patient Outreach (Signed)
[facility]) Care Management  02/24/2018  Ezrah Dembeck [birth year] [id number]   Care coordination call with [place].  All prescriptions received from Dr. Philip Aspen.  Pharmacist will coordinate with patient on when compliance packaging will be started.   Plan: I will await call back from pharmacy regarding start date for packs.  I will coordinate home visit with patient after packs are delivered.   Ralene Bathe, PharmD, [place] [phone]

## 2018-02-24 NOTE — Patient Outreach (Signed)
[place] [facility] [facility]) Care Management   02/24/2018  [name] [name] 1930-03-20 [id number]  [name] [name] is an 84 y.o. female  Subjective:   Member alert and oriented 3, denies any pain or discomfort at this time, but does report some intermittent pain in the hip area.  Has Hydrocodone for pain relief.  Report taking medications as instructed with help of husband.  Objective:   Review of Systems  Constitutional: Negative.   HENT: Negative.   Eyes: Negative.   Respiratory: Negative.   Cardiovascular: Negative.   Gastrointestinal: Negative.   Genitourinary: Negative.   Musculoskeletal: Negative.   Skin: Negative.   Neurological: Negative.   Endo/Heme/Allergies: Negative.   Psychiatric/Behavioral: Negative.     Physical Exam  Constitutional: She is oriented to person, place, and time. She appears well-developed and well-nourished.  Neck: Normal range of motion.  Cardiovascular: Normal rate, regular rhythm and normal heart sounds.  Respiratory: Effort normal and breath sounds normal.  GI: Soft. Bowel sounds are normal.  Musculoskeletal: Normal range of motion.  Neurological: She is alert and oriented to person, place, and time.  Skin: Skin is warm and dry.   BP 131/79 (BP Location: Left Wrist, Patient Position: Sitting, Cuff Size: Normal)   Pulse 81   Resp 18   Ht 1.549 m (_0 )   Wt 135 lb (61.2 kg)   SpO2 97%   BMI 25.51 kg/m   Encounter Medications:   Outpatient Encounter Medications as of 02/24/2018  Medication Sig  . Carboxymethylcellulose Sod PF (EQ RESTORE PLUS LUBRICANT EYE) 0.5 % SOLN Apply 1 drop to eye daily as needed.  . dicyclomine (BENTYL) 20 MG tablet TAKE 1/2 TABLET BY MOUTH EVERY 6 HOURS AS NEEDED FOR SPASMS(ABDOMINAL PAIN)  . diltiazem (DILT-XR) 120 MG 24 hr capsule TAKE 1 CAPSULE(120 MG) BY MOUTH DAILY  . gabapentin (NEURONTIN) 300 MG capsule Take 300 mg by mouth 3 (three) times daily.  Marland Kitchen glimepiride (AMARYL) 4 MG tablet Take 4 mg  by mouth daily with breakfast.   . HYDROcodone-acetaminophen (NORCO/VICODIN) 5-325 MG tablet Take 1 tablet by mouth every 6 (six) hours as needed for moderate pain.  Marland Kitchen lactose free nutrition (BOOST) LIQD Take 237 mLs by mouth as needed.  Marland Kitchen LANTUS SOLOSTAR 100 UNIT/ML Solostar Pen Inject 22 Units into the skin daily.   Marland Kitchen latanoprost (XALATAN) 0.005 % ophthalmic solution Place 1 drop into both eyes at bedtime.  Marland Kitchen loperamide (LOPERAMIDE A-D) 2 MG tablet Take 1 tablet (2 mg total) by mouth as needed for diarrhea or loose stools (use at bedtime). (Patient taking differently: Take 2 mg by mouth at bedtime. )  . LORazepam (ATIVAN) 1 MG tablet Take 1 tablet (1 mg total) by mouth 3 (three) times daily. (Patient taking differently: Take 1 mg by mouth every 8 (eight) hours as needed. )  . methylphenidate (RITALIN) 5 MG tablet Take 1 tablet (5 mg total) by mouth See admin instructions. take 14m in the morning daily and then 568mtwice a day on every other day (Patient taking differently: Take 5 mg by mouth daily. take 29m48mn the morning daily and then 29mg59mice a day on every other day)  . metoprolol tartrate (LOPRESSOR) 25 MG tablet TAKE 1 TABLET(25 MG) BY MOUTH TWICE DAILY WITH FOOD  . mirtazapine (REMERON) 7.5 MG tablet TAKE 1 TABLET(7.5 MG) BY MOUTH AT BEDTIME  . Multiple Vitamin (MULTIVITAMIN WITH MINERALS) TABS Take 1 tablet by mouth daily.  . omMarland Kitchenprazole (PRILOSEC) 20 MG capsule Take 20 mg  by mouth 2 (two) times daily.   . sucralfate (CARAFATE) 1 g tablet Take 1 g by mouth 2 (two) times daily.  . SYMBICORT 160-4.5 MCG/ACT inhaler Inhale 2 puffs into the lungs 2 (two) times daily.   . timolol (TIMOPTIC) 0.5 % ophthalmic solution Place 1 drop into the right eye.   . warfarin (COUMADIN) 5 MG tablet Take 0.5-1 tablets (2.5-5 mg total) by mouth daily. Take 1.'25mg'$  every Sun/Wed then 2.5 mg the rest of the week  . divalproex (DEPAKOTE SPRINKLE) 125 MG capsule Take 125 mg by mouth 2 (two) times daily.  Marland Kitchen  oxybutynin (DITROPAN) 5 MG tablet Take 1 tablet by mouth at bedtime.  . promethazine (PHENERGAN) 25 MG tablet Take 25 mg by mouth 3 (three) times daily as needed for nausea or vomiting.   No facility-administered encounter medications on file as of 02/24/2018.     Functional Status:   In your present state of health, do you have any difficulty performing the following activities: 02/24/2018 01/07/2018  Hearing? N -  Vision? Y -  Difficulty concentrating or making decisions? N -  Walking or climbing stairs? Y -  Dressing or bathing? N -  Doing errands, shopping? [name]  Preparing Food and eating ? Y -  Using the Toilet? N -  In the past six months, have you accidently leaked urine? Y -  Do you have problems with loss of bowel control? Y -  Managing your Medications? Y -  Managing your Finances? N -  Housekeeping or managing your Housekeeping? Y -  Some recent data might be hidden    Fall/Depression Screening:    Fall Risk  02/24/2018  Falls in the past year? Yes  Number falls in past yr: 2 or more  Injury with Fall? Yes  Risk Factor Category  High Fall Risk  Risk for fall due to : History of fall(s)  Follow up Falls prevention discussed   PHQ 2/9 Scores 02/24/2018  PHQ - 2 Score 0    Assessment:    Met with member at scheduled time, husband present during visit.  Report compliance with home health PT and OT, denies concerns at this time.  State she does have some days that are better than others but overall feel she is progressing well.    Report husband is very active and supportive in her care, denies any urgent needs at this time.  Plan:   Will follow up next week for weekly transition of care call.  THN CM Care Plan Problem One     Most Recent Value  Care Plan Problem One  Risk for readmission related to complications of hip surgery as evidenced by recent hospitalization requiring rehab stay  Role Documenting the Problem One  Care Management [place] for  Problem One  Active  [facility] Long Term Goal   Member will not be readmitted to acute care facility within 31 days of discharge  [facility] Long Term Goal Start Date  02/11/18  Interventions for Problem One Long Term Goal  Provided member with [facility] calendar tool book with resources for 24 hour nurse triage line.  THN CM Short Term Goal #1   Member will report compliance with medications as instructed over the next 4 weeks  THN CM Short Term Goal #1 Start Date  02/11/18  Interventions for Short Term Goal #1  Medications reviewed in the home, discussed collaboration with [facility] pharmacist to transition to pill packaging  THN CM Short Term  Goal #2   Member will keep and attend appointment with surgeon within the next 2 weeks  THN CM Short Term Goal #2 Start Date  02/11/18  [facility] CM Short Term Goal #2 Met Date  02/24/18     [name], RN, MSN [place] Manager [phone]

## 2018-02-25 ENCOUNTER — Other Ambulatory Visit: Payer: Self-pay | Admitting: Pharmacist

## 2018-02-25 ENCOUNTER — Other Ambulatory Visit: Payer: Self-pay | Admitting: *Deleted

## 2018-02-25 ENCOUNTER — Ambulatory Visit: Payer: Self-pay | Admitting: Pharmacist

## 2018-02-25 NOTE — Patient Outreach (Signed)
[facility]) Care Management  02/25/2018  Dollye Glasser [birth year] [id number]   [facility] still waiting on return call from family regarding preference for when to start compliance packs.  I will follow back up with patient and pharmacy next week.   Ralene Bathe, PharmD, [place] [phone]

## 2018-02-25 NOTE — Patient Outreach (Signed)
[place] [facility]) Care Management  02/25/2018  [name] [name] [birth year] [id number]   CSW spoke with pt's husband who reports they have received the [facility] and he is fixing one for her to eat today.  He has also received the resources sent and denies any further CSW needs/support.  CSW will close CSW consult and advise PCP and [facility] team.    Eduard Clos, MSW, [place] Worker  [place] [phone]

## 2018-03-01 ENCOUNTER — Encounter: Payer: Self-pay | Admitting: Podiatry

## 2018-03-01 ENCOUNTER — Ambulatory Visit: Payer: Medicare HMO | Admitting: Podiatry

## 2018-03-01 DIAGNOSIS — B351 Tinea unguium: Secondary | ICD-10-CM | POA: Diagnosis not present

## 2018-03-01 DIAGNOSIS — I129 Hypertensive chronic kidney disease with stage 1 through stage 4 chronic kidney disease, or unspecified chronic kidney disease: Secondary | ICD-10-CM | POA: Diagnosis not present

## 2018-03-01 DIAGNOSIS — S72042D Displaced fracture of base of neck of left femur, subsequent encounter for closed fracture with routine healing: Secondary | ICD-10-CM | POA: Diagnosis not present

## 2018-03-01 DIAGNOSIS — G629 Polyneuropathy, unspecified: Secondary | ICD-10-CM | POA: Diagnosis not present

## 2018-03-01 DIAGNOSIS — B9620 Unspecified Escherichia coli [E. coli] as the cause of diseases classified elsewhere: Secondary | ICD-10-CM | POA: Diagnosis not present

## 2018-03-01 DIAGNOSIS — E1122 Type 2 diabetes mellitus with diabetic chronic kidney disease: Secondary | ICD-10-CM | POA: Diagnosis not present

## 2018-03-01 DIAGNOSIS — M79676 Pain in unspecified toe(s): Secondary | ICD-10-CM

## 2018-03-01 DIAGNOSIS — N183 Chronic kidney disease, stage 3 (moderate): Secondary | ICD-10-CM | POA: Diagnosis not present

## 2018-03-01 DIAGNOSIS — N390 Urinary tract infection, site not specified: Secondary | ICD-10-CM | POA: Diagnosis not present

## 2018-03-02 DIAGNOSIS — B9620 Unspecified Escherichia coli [E. coli] as the cause of diseases classified elsewhere: Secondary | ICD-10-CM | POA: Diagnosis not present

## 2018-03-02 DIAGNOSIS — N183 Chronic kidney disease, stage 3 (moderate): Secondary | ICD-10-CM | POA: Diagnosis not present

## 2018-03-02 DIAGNOSIS — S72042D Displaced fracture of base of neck of left femur, subsequent encounter for closed fracture with routine healing: Secondary | ICD-10-CM | POA: Diagnosis not present

## 2018-03-02 DIAGNOSIS — I129 Hypertensive chronic kidney disease with stage 1 through stage 4 chronic kidney disease, or unspecified chronic kidney disease: Secondary | ICD-10-CM | POA: Diagnosis not present

## 2018-03-02 DIAGNOSIS — E1122 Type 2 diabetes mellitus with diabetic chronic kidney disease: Secondary | ICD-10-CM | POA: Diagnosis not present

## 2018-03-02 DIAGNOSIS — N390 Urinary tract infection, site not specified: Secondary | ICD-10-CM | POA: Diagnosis not present

## 2018-03-02 NOTE — Progress Notes (Signed)
Patient ID: [name] [name], female   DOB: Oct 27, 1929, 84 y.o.   MRN: [id number]  Subjective: 84 y.o.-year-old female returns the office today for painful, elongated, thickened toenails which she is unable to trim herself. Denies any redness or drainage around the nails. Denies any acute changes since last appointment and no new complaints today. Denies any systemic complaints such as fevers, chills, nausea, vomiting.   Since I last saw her she did have a fall and broke a hip. No other lower extremity concerns.   Objective: NAD; presents with her husband who also has no new concerns. DP/PT pulses 1/4 bilaterally, CRT less than 3 seconds Protective sensation decreased with Simms Weinstein monofilament Nails hypertrophic, dystrophic, elongated, brittle, discolored 10. There is tenderness overlying the nails 1-5 bilaterally. There is no surrounding erythema or drainage along the nail sites. No open lesions or pre-ulcerative lesions are identified. No other areas of tenderness bilateral lower extremities. No overlying edema, erythema, increased warmth. No pain with calf compression, swelling, warmth, erythema.  Assessment: Patient presents with symptomatic onychomycosis  Plan: -Treatment options including alternatives, risks, complications were discussed -Nails sharply debrided 10 without complication/bleeding. -Discussed daily foot inspection. If there are any changes, to call the office immediately.  -Follow-up in 3 months or sooner if any problems are to arise. In the meantime, encouraged to call the office with any questions, concerns, changes symptoms.  Celesta Gentile, DPM

## 2018-03-03 ENCOUNTER — Other Ambulatory Visit: Payer: Self-pay | Admitting: Pharmacist

## 2018-03-03 ENCOUNTER — Ambulatory Visit: Payer: Self-pay | Admitting: Pharmacist

## 2018-03-03 ENCOUNTER — Other Ambulatory Visit: Payer: Self-pay | Admitting: *Deleted

## 2018-03-03 NOTE — Patient Outreach (Signed)
[place] [facility]) Care Management  03/03/2018  Aronda Burford 1930/01/03 [id number]   Successful call placed to Ms. Leyton Murray-Rush.  Spouse answered and confirmed that [place] will deliver short-supply of medications to patient starting tomorrow and will continue to deliver weekly fills until all refills are aligned for compliance packaging.  He reports this will likely not start until July or August.  He has my phone number and will call me if he has any questions or medication needs before the compliance packaging starts.    Plan: I will contact patient in August to follow-up regarding medication adherence and ensure patient understands how to use compliance packaging.   Ralene Bathe, PharmD, [place] [phone]

## 2018-03-03 NOTE — Patient Outreach (Signed)
[facility]) Care Management  03/03/2018  [name] [name] 1929/10/28 [id number]    Weekly transition of care call placed to member's husband.  He report she is doing well, mobility has improved with therapy.  State her pain is better managed as well.  He has been in contact with [facility] pharmacist in effort to better manager member's medications, state he will receive the first set of packaged pills within the next week.  Member denies any urgent concerns at this time, will follow up next week.  THN CM Care Plan Problem One     Most Recent Value  Care Plan Problem One  Risk for readmission related to complications of hip surgery as evidenced by recent hospitalization requiring rehab stay  Role Documenting the Problem One  Care Management [place] for Problem One  Active  [facility] Long Term Goal   Member will not be readmitted to acute care facility within 31 days of discharge  THN Long Term Goal Start Date  02/11/18  Interventions for Problem One Long Term Goal  Confirmed member remains active with PT, advised of importance of maintaining therapy in effort to increase mobility and decrease risk of fall  THN CM Short Term Goal #1   Member will report compliance with medications as instructed over the next 4 weeks  THN CM Short Term Goal #1 Start Date  02/11/18  [facility] CM Short Term Goal #1 Met Date  03/03/18     [name], RN, MSN [place] Manager [phone]

## 2018-03-06 ENCOUNTER — Encounter: Payer: Self-pay | Admitting: *Deleted

## 2018-03-09 DIAGNOSIS — E1122 Type 2 diabetes mellitus with diabetic chronic kidney disease: Secondary | ICD-10-CM | POA: Diagnosis not present

## 2018-03-09 DIAGNOSIS — N390 Urinary tract infection, site not specified: Secondary | ICD-10-CM | POA: Diagnosis not present

## 2018-03-09 DIAGNOSIS — S72042D Displaced fracture of base of neck of left femur, subsequent encounter for closed fracture with routine healing: Secondary | ICD-10-CM | POA: Diagnosis not present

## 2018-03-09 DIAGNOSIS — N183 Chronic kidney disease, stage 3 (moderate): Secondary | ICD-10-CM | POA: Diagnosis not present

## 2018-03-09 DIAGNOSIS — I129 Hypertensive chronic kidney disease with stage 1 through stage 4 chronic kidney disease, or unspecified chronic kidney disease: Secondary | ICD-10-CM | POA: Diagnosis not present

## 2018-03-09 DIAGNOSIS — B9620 Unspecified Escherichia coli [E. coli] as the cause of diseases classified elsewhere: Secondary | ICD-10-CM | POA: Diagnosis not present

## 2018-03-11 ENCOUNTER — Other Ambulatory Visit: Payer: Self-pay | Admitting: *Deleted

## 2018-03-11 NOTE — Patient Outreach (Signed)
[place] [facility]) Care Management  03/11/2018  [name] [name] Mar 26, 1930 [id number]   Weekly transition of care call placed to member, no answer.  HIPAA compliant voice message left.  Will follow up within the next 2 business days if no call back.  [name], [place], MSN [place] [phone]

## 2018-03-14 ENCOUNTER — Encounter: Payer: Self-pay | Admitting: Pharmacist

## 2018-03-14 ENCOUNTER — Other Ambulatory Visit: Payer: Self-pay | Admitting: *Deleted

## 2018-03-14 NOTE — Patient Outreach (Signed)
[place] [facility]) Care Management  03/14/2018  Shantanique Hodo 12/04/1929 [id number]   Weekly transition of care call placed to member, husband answers phone.  He report member had a good weekend, but today is not feeling that well.  He state she usually is not feeling well when she wake up, but is better as the day progresses.  Member has chronic GI issues causing some abdominal discomfort and sometimes diarrhea.  She was active in the past with a specialist, but has not had a follow up recently due to hospitalization for fall.  Husband report he will call today to schedule appointment.  Denies any ortho concerns.  Agree to home visit next week, if remain stable and goals met, will consider case closure versus transition to health coach.   THN CM Care Plan Problem One     Most Recent Value  Care Plan Problem One  Risk for readmission related to complications of hip surgery as evidenced by recent hospitalization requiring rehab stay  Role Documenting the Problem One  Care Management [place] for Problem One  Active  [facility] Long Term Goal   Member will not be readmitted to acute care facility within 31 days of discharge  [facility] Long Term Goal Start Date  02/11/18  [facility] Long Term Goal Met Date  03/14/18  [facility] CM Short Term Goal #1   Member will have appointment scheduled with GI specialist within the next 2 weeks  THN CM Short Term Goal #1 Start Date  03/14/18  Interventions for Short Term Goal #1  Advised of importance of ongoing follow up with GI specialist in effort to effectively manage GI symptoms.  Advised to contact as soon as possible to schedule appointment.     [name], [place], MSN [place] [phone]

## 2018-03-14 NOTE — Telephone Encounter (Signed)
This encounter was created in error - please disregard.

## 2018-03-16 DIAGNOSIS — I129 Hypertensive chronic kidney disease with stage 1 through stage 4 chronic kidney disease, or unspecified chronic kidney disease: Secondary | ICD-10-CM | POA: Diagnosis not present

## 2018-03-16 DIAGNOSIS — S72042D Displaced fracture of base of neck of left femur, subsequent encounter for closed fracture with routine healing: Secondary | ICD-10-CM | POA: Diagnosis not present

## 2018-03-16 DIAGNOSIS — N390 Urinary tract infection, site not specified: Secondary | ICD-10-CM | POA: Diagnosis not present

## 2018-03-16 DIAGNOSIS — N183 Chronic kidney disease, stage 3 (moderate): Secondary | ICD-10-CM | POA: Diagnosis not present

## 2018-03-16 DIAGNOSIS — B9620 Unspecified Escherichia coli [E. coli] as the cause of diseases classified elsewhere: Secondary | ICD-10-CM | POA: Diagnosis not present

## 2018-03-16 DIAGNOSIS — E1122 Type 2 diabetes mellitus with diabetic chronic kidney disease: Secondary | ICD-10-CM | POA: Diagnosis not present

## 2018-03-22 DIAGNOSIS — E109 Type 1 diabetes mellitus without complications: Secondary | ICD-10-CM | POA: Diagnosis not present

## 2018-03-22 DIAGNOSIS — N3281 Overactive bladder: Secondary | ICD-10-CM | POA: Diagnosis not present

## 2018-03-22 DIAGNOSIS — E1151 Type 2 diabetes mellitus with diabetic peripheral angiopathy without gangrene: Secondary | ICD-10-CM | POA: Diagnosis not present

## 2018-03-22 DIAGNOSIS — Z7901 Long term (current) use of anticoagulants: Secondary | ICD-10-CM | POA: Diagnosis not present

## 2018-03-22 DIAGNOSIS — I482 Chronic atrial fibrillation: Secondary | ICD-10-CM | POA: Diagnosis not present

## 2018-03-22 DIAGNOSIS — R110 Nausea: Secondary | ICD-10-CM | POA: Diagnosis not present

## 2018-03-22 DIAGNOSIS — D649 Anemia, unspecified: Secondary | ICD-10-CM | POA: Diagnosis not present

## 2018-03-22 DIAGNOSIS — M545 Low back pain: Secondary | ICD-10-CM | POA: Diagnosis not present

## 2018-03-22 DIAGNOSIS — I10 Essential (primary) hypertension: Secondary | ICD-10-CM | POA: Diagnosis not present

## 2018-03-22 DIAGNOSIS — R2681 Unsteadiness on feet: Secondary | ICD-10-CM | POA: Diagnosis not present

## 2018-03-24 ENCOUNTER — Other Ambulatory Visit: Payer: Self-pay | Admitting: *Deleted

## 2018-03-24 NOTE — Patient Outreach (Signed)
[place] [facility]) Care Management   03/24/2018  Whitney Hillegass 1930/06/13 [id number]  [name] [name] is an 84 y.o. female  Subjective:   Member alert and oriented x3, denies pain but does complain of some abdominal discomfort, having diarrhea almost daily.  Primary MD scheduled for 8/2, GI specialist scheduled for 8/12.  Objective:   Review of Systems  Constitutional: Negative.   HENT: Negative.   Eyes: Negative.   Respiratory: Negative.   Cardiovascular: Negative.   Gastrointestinal: Positive for abdominal pain and diarrhea.  Genitourinary: Negative.   Musculoskeletal: Negative.   Skin: Negative.   Neurological: Negative.   Endo/Heme/Allergies: Negative.   Psychiatric/Behavioral: Negative.     Physical Exam  Constitutional: She is oriented to person, place, and time. She appears well-developed and well-nourished.  Neck: Normal range of motion.  Cardiovascular: Normal rate, regular rhythm and normal heart sounds.  Respiratory: Effort normal and breath sounds normal.  GI: Soft. Bowel sounds are normal.  Musculoskeletal: Normal range of motion.  Neurological: She is alert and oriented to person, place, and time.  Skin: Skin is warm and dry.   BP 118/82 (BP Location: Right Arm, Patient Position: Sitting, Cuff Size: Normal)   Pulse 89   Resp 20   SpO2 98%   Encounter Medications:   Outpatient Encounter Medications as of 03/24/2018  Medication Sig  . Carboxymethylcellulose Sod PF (EQ RESTORE PLUS LUBRICANT EYE) 0.5 % SOLN Apply 1 drop to eye daily as needed.  . dicyclomine (BENTYL) 20 MG tablet TAKE 1/2 TABLET BY MOUTH EVERY 6 HOURS AS NEEDED FOR SPASMS(ABDOMINAL PAIN)  . diltiazem (DILT-XR) 120 MG 24 hr capsule TAKE 1 CAPSULE(120 MG) BY MOUTH DAILY  . divalproex (DEPAKOTE SPRINKLE) 125 MG capsule Take 125 mg by mouth 2 (two) times daily.  Marland Kitchen gabapentin (NEURONTIN) 300 MG capsule Take 300 mg by mouth 3 (three) times daily.  Marland Kitchen glimepiride (AMARYL) 4 MG  tablet Take 4 mg by mouth daily with breakfast.   . HYDROcodone-acetaminophen (NORCO/VICODIN) 5-325 MG tablet Take 1 tablet by mouth every 6 (six) hours as needed for moderate pain.  Marland Kitchen lactose free nutrition (BOOST) LIQD Take 237 mLs by mouth as needed.  Marland Kitchen LANTUS SOLOSTAR 100 UNIT/ML Solostar Pen Inject 22 Units into the skin daily.   Marland Kitchen latanoprost (XALATAN) 0.005 % ophthalmic solution Place 1 drop into both eyes at bedtime.  Marland Kitchen loperamide (LOPERAMIDE A-D) 2 MG tablet Take 1 tablet (2 mg total) by mouth as needed for diarrhea or loose stools (use at bedtime). (Patient taking differently: Take 2 mg by mouth at bedtime. )  . LORazepam (ATIVAN) 1 MG tablet Take 1 tablet (1 mg total) by mouth 3 (three) times daily. (Patient taking differently: Take 1 mg by mouth every 8 (eight) hours as needed. )  . methylphenidate (RITALIN) 5 MG tablet Take 1 tablet (5 mg total) by mouth See admin instructions. take 39m in the morning daily and then 574mtwice a day on every other day (Patient taking differently: Take 5 mg by mouth daily. take 46m346mn the morning daily and then 46mg27mice a day on every other day)  . metoprolol tartrate (LOPRESSOR) 25 MG tablet TAKE 1 TABLET(25 MG) BY MOUTH TWICE DAILY WITH FOOD  . mirtazapine (REMERON) 7.5 MG tablet TAKE 1 TABLET(7.5 MG) BY MOUTH AT BEDTIME  . Multiple Vitamin (MULTIVITAMIN WITH MINERALS) TABS Take 1 tablet by mouth daily.  . omMarland Kitchenprazole (PRILOSEC) 20 MG capsule Take 20 mg by mouth 2 (two) times daily.   .Marland Kitchen  oxybutynin (DITROPAN) 5 MG tablet Take 1 tablet by mouth at bedtime.  . promethazine (PHENERGAN) 25 MG tablet Take 25 mg by mouth 3 (three) times daily as needed for nausea or vomiting.  . sucralfate (CARAFATE) 1 g tablet Take 1 g by mouth 2 (two) times daily.  . SYMBICORT 160-4.5 MCG/ACT inhaler Inhale 2 puffs into the lungs 2 (two) times daily.   . timolol (TIMOPTIC) 0.5 % ophthalmic solution Place 1 drop into the right eye.   . warfarin (COUMADIN) 5 MG tablet Take  0.5-1 tablets (2.5-5 mg total) by mouth daily. Take 1.66m every Sun/Wed then 2.5 mg the rest of the week   No facility-administered encounter medications on file as of 03/24/2018.     Functional Status:   In your present state of health, do you have any difficulty performing the following activities: 02/24/2018 01/07/2018  Hearing? N -  Vision? Y -  Difficulty concentrating or making decisions? N -  Walking or climbing stairs? Y -  Dressing or bathing? N -  Doing errands, shopping? YTempie Donning Preparing Food and eating ? Y -  Using the Toilet? N -  In the past six months, have you accidently leaked urine? Y -  Do you have problems with loss of bowel control? Y -  Managing your Medications? Y -  Managing your Finances? N -  Housekeeping or managing your Housekeeping? Y -  Some recent data might be hidden    Fall/Depression Screening:    Fall Risk  02/24/2018  Falls in the past year? Yes  Number falls in past yr: 2 or more  Injury with Fall? Yes  Risk Factor Category  High Fall Risk  Risk for fall due to : History of fall(s)  Follow up Falls prevention discussed   PHQ 2/9 Scores 02/24/2018  PHQ - 2 Score 0    Assessment:    Met with member at scheduled time, husband present during visit.  Breathing through mouth, making sounds as if she is having trouble breathing, but denies shortness of breath.  Oxygen level normal.  She state overall, she does not feel well.  Discussed potential need to see MD earlier than scheduled, but they both state they will wait.  Report she goes through cycles and feel she will be better within the next few days.    Denies any urgent concerns, advised to contact this care manager with questions.  Plan:   Will follow up with member next month.  THN CM Care Plan Problem One     Most Recent Value  Care Plan Problem One  Risk for readmission related to complications of hip surgery as evidenced by recent hospitalization requiring rehab stay  Role Documenting  the Problem One  Care Management [place] Problem One  Active  THN CM Short Term Goal #1   Member will have appointment scheduled with GI specialist within the next 2 weeks  THN CM Short Term Goal #1 Start Date  03/14/18  [facility] Met Date  03/24/18    [facility] Problem [facility]     Most Recent Value  Care Plan Problem Two  Risk for ED visit related to persistent abdominal discomfort/  Role Documenting the Problem Two  Care Management [place] Problem [place]  Active  Interventions for Problem Two Long Term Goal   Upcoming appointments reviewed with member and husband. Advised to importance of reaching out to GI  specialist or primary MD sooner if member continues to have unrelieved discomfort  THN Long Term Goal  Member will keep and attend appointment with GI specialist within the next 45 days  THN Long Term Goal Start Date  03/24/18  [facility] CM Short Term Goal #1   Member will report decrease in abdominal discomfort within the next 4 weeks  THN CM Short Term Goal #1 Start Date  03/24/18  Interventions for Short Term Goal #2   Reviewed medications with member & husband, including antispasmodic, to help relieve discomfort     [name], RN, MSN [place] Manager [phone]

## 2018-04-14 ENCOUNTER — Other Ambulatory Visit: Payer: Self-pay | Admitting: *Deleted

## 2018-04-14 NOTE — Patient Outreach (Signed)
[place] [facility]) Care Management  04/14/2018  [name] [name] 1930/03/17 [id number]   Call placed to member to follow up on current health condition and follow up with MD.  She and husband report she continues to have difficulty when she first wake up, mainly with abdominal discomfort, but state it gets better as the day goes on.  They still don't feel as if they need to contact the MD at this time and will wait until next moth for their scheduled visit.  They do agree to have this care manager visit next week, but request call before visit as member's brother is currently on hospice.  Will follow up next week.  [name], [place], MSN [place] [phone]

## 2018-04-21 ENCOUNTER — Other Ambulatory Visit: Payer: Self-pay | Admitting: *Deleted

## 2018-04-21 NOTE — Patient Outreach (Signed)
[facility]) Care Management   04/21/2018  [name] [name] 16-Sep-1930 [id number]  [name] [name] is an 84 y.o. female  Subjective:   Member alert and oriented x3, denies pain or discomfort at this time.  Report abdominal discomfort is minimal today, stating "I've had better days but I've also had worse days.  I can't complain."    Objective:   Review of Systems  Constitutional: Negative.   HENT: Negative.   Eyes: Negative.   Respiratory: Negative.   Cardiovascular: Negative.   Gastrointestinal: Positive for abdominal pain and diarrhea.       Intermittent  Genitourinary: Negative.   Musculoskeletal: Negative.   Skin: Negative.   Neurological: Negative.   Endo/Heme/Allergies: Negative.   Psychiatric/Behavioral: Negative.     Physical Exam  Constitutional: She is oriented to person, place, and time. She appears well-developed and well-nourished.  Neck: Normal range of motion.  Cardiovascular:  Irregular  Respiratory: Effort normal and breath sounds normal.  GI: Soft. Bowel sounds are normal.  Musculoskeletal: Normal range of motion.  Neurological: She is alert and oriented to person, place, and time.  Skin: Skin is warm and dry.   BP 122/70 (BP Location: Right Arm, Patient Position: Sitting, Cuff Size: Normal)   Pulse 87   Resp (!) 22   Ht 1.549 m (_0 )   Wt 135 lb (61.2 kg)   SpO2 98%   BMI 25.51 kg/m   Encounter Medications:   Outpatient Encounter Medications as of 04/21/2018  Medication Sig Note  . Carboxymethylcellulose Sod PF (EQ RESTORE PLUS LUBRICANT EYE) 0.5 % SOLN Apply 1 drop to eye daily as needed.   . dicyclomine (BENTYL) 20 MG tablet TAKE 1/2 TABLET BY MOUTH EVERY 6 HOURS AS NEEDED FOR SPASMS(ABDOMINAL PAIN)   . diltiazem (DILT-XR) 120 MG 24 hr capsule TAKE 1 CAPSULE(120 MG) BY MOUTH DAILY   . gabapentin (NEURONTIN) 300 MG capsule Take 300 mg by mouth 3 (three) times daily. 04/21/2018: Taking once a day  . glimepiride (AMARYL)  4 MG tablet Take 4 mg by mouth daily with breakfast.    . HYDROcodone-acetaminophen (NORCO/VICODIN) 5-325 MG tablet Take 1 tablet by mouth every 6 (six) hours as needed for moderate pain.   Marland Kitchen lactose free nutrition (BOOST) LIQD Take 237 mLs by mouth as needed.   Marland Kitchen LANTUS SOLOSTAR 100 UNIT/ML Solostar Pen Inject 22 Units into the skin daily.    Marland Kitchen latanoprost (XALATAN) 0.005 % ophthalmic solution Place 1 drop into both eyes at bedtime.   Marland Kitchen loperamide (LOPERAMIDE A-D) 2 MG tablet Take 1 tablet (2 mg total) by mouth as needed for diarrhea or loose stools (use at bedtime). (Patient taking differently: Take 2 mg by mouth at bedtime. )   . LORazepam (ATIVAN) 1 MG tablet Take 1 tablet (1 mg total) by mouth 3 (three) times daily. (Patient taking differently: Take 1 mg by mouth every 8 (eight) hours as needed. ) 04/21/2018: Taking once a day  . methylphenidate (RITALIN) 5 MG tablet Take 1 tablet (5 mg total) by mouth See admin instructions. take 16m in the morning daily and then 53mtwice a day on every other day (Patient taking differently: Take 5 mg by mouth daily. take 56m52mn the morning daily and then 56mg50mice a day on every other day) 04/21/2018: Taking once a day  . metoprolol tartrate (LOPRESSOR) 25 MG tablet TAKE 1 TABLET(25 MG) BY MOUTH TWICE DAILY WITH FOOD   . mirtazapine (REMERON) 7.5 MG tablet TAKE 1 TABLET(7.5  MG) BY MOUTH AT BEDTIME   . Multiple Vitamin (MULTIVITAMIN WITH MINERALS) TABS Take 1 tablet by mouth daily.   Marland Kitchen omeprazole (PRILOSEC) 20 MG capsule Take 20 mg by mouth 2 (two) times daily.    Marland Kitchen oxybutynin (DITROPAN) 5 MG tablet Take 1 tablet by mouth at bedtime.   . promethazine (PHENERGAN) 25 MG tablet Take 25 mg by mouth 3 (three) times daily as needed for nausea or vomiting.   . SYMBICORT 160-4.5 MCG/ACT inhaler Inhale 2 puffs into the lungs 2 (two) times daily.    . timolol (TIMOPTIC) 0.5 % ophthalmic solution Place 1 drop into the right eye.    . warfarin (COUMADIN) 5 MG tablet Take  0.5-1 tablets (2.5-5 mg total) by mouth daily. Take 1.52m every Sun/Wed then 2.5 mg the rest of the week   . divalproex (DEPAKOTE SPRINKLE) 125 MG capsule Take 125 mg by mouth 2 (two) times daily.   . sucralfate (CARAFATE) 1 g tablet Take 1 g by mouth 2 (two) times daily.    No facility-administered encounter medications on file as of 04/21/2018.     Functional Status:   In your present state of health, do you have any difficulty performing the following activities: 02/24/2018 01/07/2018  Hearing? N -  Vision? Y -  Difficulty concentrating or making decisions? N -  Walking or climbing stairs? Y -  Dressing or bathing? N -  Doing errands, shopping? YTempie Donning Preparing Food and eating ? Y -  Using the Toilet? N -  In the past six months, have you accidently leaked urine? Y -  Do you have problems with loss of bowel control? Y -  Managing your Medications? Y -  Managing your Finances? N -  Housekeeping or managing your Housekeeping? Y -  Some recent data might be hidden    Fall/Depression Screening:    Fall Risk  02/24/2018  Falls in the past year? Yes  Number falls in past yr: 2 or more  Injury with Fall? Yes  Risk Factor Category  High Fall Risk  Risk for fall due to : History of fall(s)  Follow up Falls prevention discussed   PHQ 2/9 Scores 02/24/2018  PHQ - 2 Score 0    Assessment:    Met with member at scheduled time.  Husband present during visit.  No distress noted.  He continues to manage her medications, but state that he has changed some of the member's doses to help control her symptoms (notes made on medication review).  Has been trying to transition to pill packaging, advised to concern for pharmacy to fill packages according to the MD order instead of what the member is actually taking.  He is advised to discuss concerns with MD rather than self adjusting, next appointment scheduled for 8/2.  He is aware that [facility]  [facility].  Both member and husband deny any urgent concerns, advised to contact this care manager with questions.  Plan:   [facility] to verify member will be taking correct medication doses once all pill packs are complete. Will follow up with member within the next month.  If remain stable will consider case closure.  THN CM Care Plan Problem Two     Most Recent Value  Care Plan Problem Two  Risk for ED visit related to persistent abdominal discomfort/  Role Documenting the Problem Two  Care Management Coordinator  Care Plan  for Problem Two  Active  Interventions for Problem Two Long Term Goal   Member assessed for persistent GI symptoms. Medications reviewed, will collaborate with [facility] pharmacist regarding regime  THN Long Term Goal  Member will keep and attend appointment with GI specialist within the next 45 days  THN Long Term Goal Start Date  03/24/18  [facility] CM Short Term Goal #1   Member will report decrease in abdominal discomfort within the next 4 weeks  THN CM Short Term Goal #1 Start Date  03/24/18  [facility] CM Short Term Goal #1 Met Date   04/21/18     [name], RN, MSN [place] [phone]

## 2018-04-22 ENCOUNTER — Other Ambulatory Visit: Payer: Self-pay | Admitting: Pharmacist

## 2018-04-22 NOTE — Patient Outreach (Signed)
[facility]) Care Management  04/22/2018  [name] [name] Feb 01, 1930 [id number]  Communication received from [place] regarding patient's medications and discrepancies with dosing. Successful call placed to patient's spouse.  HIPAA identifiers verified. Spouse reports patient is taking 3 medications differently than prescribed as follows:    Methylphenidate: 5mg  daily rather than BID  Gabapentin: 300mg  TID --> Qday.  Patient notes no difference in symptoms with reduced or increased dose.  Lorazepam TID --> Qday (at lunch) PRN.    Spouse and patient prefer this change in dose however they have not notified provider Dr. [name] of changes.    Spouse confirms plan to start compliance packaging from [facility] in early August.   Call coordination call placed to Dr. Shon Baton office to relay changes and to request new RX for gabapentin be called into [facility] if provider agrees with change.  Methylphenidate and Lorazepam are control substances therefore will need new monthly paper prescriptions from provider.    Plan: I will await call back from Dr. Shon Baton office.   Ralene Bathe, PharmD, [place] [phone]

## 2018-04-27 DIAGNOSIS — Z79899 Other long term (current) drug therapy: Secondary | ICD-10-CM | POA: Diagnosis not present

## 2018-04-27 DIAGNOSIS — M545 Low back pain: Secondary | ICD-10-CM | POA: Diagnosis not present

## 2018-04-27 DIAGNOSIS — I482 Chronic atrial fibrillation: Secondary | ICD-10-CM | POA: Diagnosis not present

## 2018-04-27 DIAGNOSIS — Z7901 Long term (current) use of anticoagulants: Secondary | ICD-10-CM | POA: Diagnosis not present

## 2018-04-27 DIAGNOSIS — I10 Essential (primary) hypertension: Secondary | ICD-10-CM | POA: Diagnosis not present

## 2018-04-27 DIAGNOSIS — R110 Nausea: Secondary | ICD-10-CM | POA: Diagnosis not present

## 2018-04-27 DIAGNOSIS — N3281 Overactive bladder: Secondary | ICD-10-CM | POA: Diagnosis not present

## 2018-04-28 ENCOUNTER — Other Ambulatory Visit: Payer: Self-pay | Admitting: Pharmacist

## 2018-04-28 NOTE — Patient Outreach (Signed)
[facility]) Care Management  04/28/2018  [name] [name] 12/16/1929 [id number]  [place] has received new prescription for gabapentin with updated dosing instructions per spouse's request.  Pharmacist states [name]'s spouse requested compliance packs be started a few weeks later than originally planned as he thinks patient has more medication than he originally thought. Pharmacist will follow-up with patient regarding start date for packs.   Plan: I will follow-up with patient later this month regarding compliance packs.   Ralene Bathe, PharmD, [place] [phone]

## 2018-05-10 DIAGNOSIS — S72002D Fracture of unspecified part of neck of left femur, subsequent encounter for closed fracture with routine healing: Secondary | ICD-10-CM | POA: Diagnosis not present

## 2018-05-11 ENCOUNTER — Ambulatory Visit: Payer: Self-pay | Admitting: Pharmacist

## 2018-05-13 ENCOUNTER — Other Ambulatory Visit: Payer: Self-pay | Admitting: *Deleted

## 2018-05-13 NOTE — Patient Outreach (Signed)
[name] [name]) Care Management  05/13/2018  [name] [name] [birth year] [id number]   Call placed to member/husband to follow up on current health condition.  He report member continue to have GI concerns daily, particularly in the morning.  Experience nausea and diarrhea the is relieved with medications and rest.  She has appointment with GI specialist on 8/12.  Denies any other concerns, report appointment with ortho surgeon went well, will follow up within the next 3 months.  Agrees to nursing closure, will contact this care manager if they need assistance pending office visit on Monday.  Remain active with [facility] pharmacist for assistance with pill packaging.  Will notify primary MD and pharmacist of nursing closure.  [name] [name], [place], MSN [place] [phone]

## 2018-05-16 ENCOUNTER — Encounter (HOSPITAL_COMMUNITY): Payer: Self-pay | Admitting: Emergency Medicine

## 2018-05-16 ENCOUNTER — Emergency Department (HOSPITAL_COMMUNITY): Payer: Medicare HMO

## 2018-05-16 ENCOUNTER — Ambulatory Visit: Payer: Medicare HMO | Admitting: Internal Medicine

## 2018-05-16 ENCOUNTER — Emergency Department (HOSPITAL_COMMUNITY)
Admission: EM | Admit: 2018-05-16 | Discharge: 2018-05-17 | Disposition: A | Discharge status: Home / Self Care | Payer: Medicare HMO | Attending: Emergency Medicine | Admitting: Emergency Medicine

## 2018-05-16 DIAGNOSIS — Z794 Long term (current) use of insulin: Secondary | ICD-10-CM | POA: Insufficient documentation

## 2018-05-16 DIAGNOSIS — H539 Unspecified visual disturbance: Secondary | ICD-10-CM | POA: Diagnosis not present

## 2018-05-16 DIAGNOSIS — R1030 Lower abdominal pain, unspecified: Secondary | ICD-10-CM | POA: Diagnosis not present

## 2018-05-16 DIAGNOSIS — E1140 Type 2 diabetes mellitus with diabetic neuropathy, unspecified: Secondary | ICD-10-CM | POA: Diagnosis not present

## 2018-05-16 DIAGNOSIS — I10 Essential (primary) hypertension: Secondary | ICD-10-CM | POA: Diagnosis not present

## 2018-05-16 DIAGNOSIS — H538 Other visual disturbances: Secondary | ICD-10-CM

## 2018-05-16 DIAGNOSIS — Z96642 Presence of left artificial hip joint: Secondary | ICD-10-CM | POA: Insufficient documentation

## 2018-05-16 DIAGNOSIS — Z79899 Other long term (current) drug therapy: Secondary | ICD-10-CM | POA: Insufficient documentation

## 2018-05-16 DIAGNOSIS — Z7901 Long term (current) use of anticoagulants: Secondary | ICD-10-CM | POA: Insufficient documentation

## 2018-05-16 DIAGNOSIS — R109 Unspecified abdominal pain: Secondary | ICD-10-CM | POA: Diagnosis not present

## 2018-05-16 DIAGNOSIS — J449 Chronic obstructive pulmonary disease, unspecified: Secondary | ICD-10-CM | POA: Insufficient documentation

## 2018-05-16 DIAGNOSIS — R112 Nausea with vomiting, unspecified: Secondary | ICD-10-CM | POA: Diagnosis not present

## 2018-05-16 DIAGNOSIS — R1111 Vomiting without nausea: Secondary | ICD-10-CM | POA: Diagnosis not present

## 2018-05-16 DIAGNOSIS — R42 Dizziness and giddiness: Secondary | ICD-10-CM | POA: Diagnosis not present

## 2018-05-16 DIAGNOSIS — R110 Nausea: Secondary | ICD-10-CM | POA: Diagnosis not present

## 2018-05-16 DIAGNOSIS — I6782 Cerebral ischemia: Secondary | ICD-10-CM | POA: Diagnosis not present

## 2018-05-16 LAB — COMPREHENSIVE METABOLIC PANEL
ALK PHOS: 95 U/L (ref 38–126)
ALT: 15 U/L (ref 0–44)
ANION GAP: 13 (ref 5–15)
AST: 28 U/L (ref 15–41)
Albumin: 3.7 g/dL (ref 3.5–5.0)
BUN: 8 mg/dL (ref 8–23)
CALCIUM: 9.6 mg/dL (ref 8.9–10.3)
CO2: 23 mmol/L (ref 22–32)
CREATININE: 0.9 mg/dL (ref 0.44–1.00)
Chloride: 102 mmol/L (ref 98–111)
GFR calc non Af Amer: 56 mL/min — ABNORMAL LOW (ref >60)
GLUCOSE: 125 mg/dL — AB (ref 70–99)
Potassium: 4.7 mmol/L (ref 3.5–5.1)
SODIUM: 138 mmol/L (ref 135–145)
TOTAL PROTEIN: 7.7 g/dL (ref 6.5–8.1)
Total Bilirubin: 0.6 mg/dL (ref 0.3–1.2)

## 2018-05-16 LAB — CBC
HCT: 44.4 % (ref 36.0–46.0)
Hemoglobin: 14.3 g/dL (ref 12.0–15.0)
MCH: 28 pg (ref 26.0–34.0)
MCHC: 32.2 g/dL (ref 30.0–36.0)
MCV: 86.9 fL (ref 78.0–100.0)
PLATELETS: 281 10*3/uL (ref 150–400)
RBC: 5.11 MIL/uL (ref 3.87–5.11)
RDW: 14.5 % (ref 11.5–15.5)
WBC: 11.5 10*3/uL — ABNORMAL HIGH (ref 4.0–10.5)

## 2018-05-16 LAB — PROTIME-INR
INR: 2.49
Prothrombin Time: 26.7 seconds — ABNORMAL HIGH (ref 11.4–15.2)

## 2018-05-16 LAB — I-STAT TROPONIN, ED: TROPONIN I, POC: 0 ng/mL (ref 0.00–0.08)

## 2018-05-16 LAB — LIPASE, BLOOD: Lipase: 29 U/L (ref 11–51)

## 2018-05-16 MED ORDER — SODIUM CHLORIDE 0.9 % IV BOLUS
500.0000 mL | Freq: Once | INTRAVENOUS | Status: AC
  Administered 2018-05-16: 500 mL via INTRAVENOUS

## 2018-05-16 MED ORDER — HYDROCODONE-ACETAMINOPHEN 5-325 MG PO TABS
1.0000 | ORAL_TABLET | Freq: Once | ORAL | Status: AC
  Administered 2018-05-16: 1 via ORAL
  Filled 2018-05-16: qty 1

## 2018-05-16 MED ORDER — ONDANSETRON HCL 4 MG/2ML IJ SOLN: 4.0000 mg | Freq: Once | INTRAMUSCULAR | Status: DC | PRN

## 2018-05-16 MED ORDER — LORAZEPAM 2 MG/ML IJ SOLN
0.5000 mg | Freq: Once | INTRAMUSCULAR | Status: AC
  Administered 2018-05-16: 0.5 mg via INTRAVENOUS
  Filled 2018-05-16: qty 1

## 2018-05-16 MED ORDER — METOPROLOL TARTRATE 5 MG/5ML IV SOLN
5.0000 mg | Freq: Once | INTRAVENOUS | Status: AC
  Administered 2018-05-16: 5 mg via INTRAVENOUS
  Filled 2018-05-16: qty 5

## 2018-05-16 NOTE — ED Triage Notes (Signed)
Pt coming from home via [place] with hx of GI problems and has been having N/V and intermittent lower abd. pain for 7-10 days that wakes her up at night. Pt is prescribed promethizine and took it at 0500 and wasn't feeling better.  Pt took morning meds including her hydrocodone without eating breakfast and threw up.  Per her husband she has thrown up 2x 24 hours. VS EMS 148/78, Afib with pulses 70-93, CBG 153, RR 20, 97% RA. No pain.

## 2018-05-16 NOTE — ED Notes (Signed)
Patient transported to MRI 

## 2018-05-16 NOTE — ED Notes (Signed)
Husband [place] number: [phone] -Cell number: [phone]

## 2018-05-16 NOTE — ED Provider Notes (Signed)
MR r/o posterior CVA Few days of vertiginous symptoms Anticoagulated secondary to atrial fib  10:45 - Re-evaluation: patient lying in bed, reports asymptomatic if lying still. She states she take hydrocodone 3 times every day and is requesting regular medication.   She reports she has had dizziness everyday for years, getting progressively worse, with significant change described as intensity of dizziness causing difficulty walking. She states she fell secondary to dizziness in May and caused hip fracture, indicating dizziness has been significant for some time.   She lives at home with husband who is her care giver. Pending MR.    MR does not show any changes of stroke.   Recheck with that patient. She is still awake, alert. Discussed MR findings and pending d/ch home. She tells me she has been seeing a doctor that is treating her visual changes which she states is her worst problem, that her symptoms are "not dizziness but the problem is my vision, its so bad". Reassured her she is safe to be at home. Husband was contacted who will come to pick her up.    Charlann Lange, PA-C 05/17/18 0023    Julianne Rice, MD 05/20/18 [phone]

## 2018-05-16 NOTE — ED Notes (Signed)
Patient transported to CT 

## 2018-05-16 NOTE — ED Provider Notes (Signed)
[facility] EMERGENCY DEPARTMENT Provider Note   CSN: [id number] Arrival date & time: 05/16/18  1622     History   Chief Complaint Chief Complaint  Patient presents with  . Nausea  . Abdominal Pain    HPI [name] [name] is a 84 y.o. female.  HPI Patient with history of atrial fibrillation on chronic Coumadin.  Presents with dizziness and feeling off balance.  Associated with nausea and several episodes of vomiting.  Husband believes this is related to a gastrointestinal problem and had an appointment to see Dr. Carlean Purl today.  Was referred to the emergency department.  She states she feels nauseated but denies any abdominal pain.  Endorses urinary frequency.  States she has a history of vertigo in the past with similar symptoms.  Denies any new focal weakness or numbness. Past Medical History:  Diagnosis Date  . Atrial fibrillation (HCC)    RVR hx.  Chronic Coumadin  . C. difficile colitis 09/21/2013, 11/2013  . Carotid stenosis    a. Carotid US (10/2013):  bilat 1-39%; f/u 1 year  . Chronic lower back pain   . COPD (chronic obstructive pulmonary disease) ([place])   . Depression   . GERD (gastroesophageal reflux disease)    with HH  . Glaucoma   . HCAP (healthcare-associated pneumonia) 11/21/2011  . High cholesterol   . Hypertension   . Metabolic encephalopathy 10/11/3843  . Mitral regurgitation 03/11/2012  . Pancreatitis    ~2008, 11/2011 post ERCP  . Pneumonia 12/2011   "first time I know about"  . Sepsis ([place]) 11/27/2013  . SIRS (systemic inflammatory response syndrome) ([place]) 03/13/2012   a/w post ERCP  pancreatitis.   . Type II diabetes mellitus [facility])     Patient Active Problem List   Diagnosis Date Noted  . DM neuropathy, type II diabetes mellitus ([place]) 01/07/2018  . Hip fracture ([place]) 01/06/2018  . Advance care planning   . Goals of care, counseling/discussion   . COPD (chronic obstructive pulmonary disease) ([place]) 10/28/2017  . RLQ abdominal pain  04/24/2014  . Nausea alone 04/11/2014  . Abdominal pain 08/05/2012    Class: Acute  . Irritable bowel syndrome 04/12/2012  . Chronic diarrhea 04/12/2012  . Encounter for long-term (current) use of anticoagulants 03/17/2012  . Depression 03/13/2012  . Anxiety 03/13/2012  . Mitral regurgitation 03/11/2012  . Chronic epigastric pain 01/25/2012  . Atrial fibrillation    . Hypokalemia 11/26/2011  . UTI (urinary tract infection) 11/13/2011  . DM II (diabetes mellitus, type II), controlled ([place]) 11/13/2011  . HTN (hypertension) 11/13/2011  . Hypercholesteremia 11/13/2011  . Glaucoma (increased eye pressure) 11/13/2011    Past Surgical History:  Procedure Laterality Date  . APPENDECTOMY  ~ 1960  . CATARACT EXTRACTION W/ INTRAOCULAR LENS  IMPLANT, BILATERAL  2000's  . CHOLECYSTECTOMY  1990's   Lap chole  . COLONOSCOPY  01/2003   diverticulosis, 72mm hyperplastic sigmoid polyp  . DILATION AND CURETTAGE OF UTERUS    . ERCP  11/13/2011   Procedure: ENDOSCOPIC RETROGRADE CHOLANGIOPANCREATOGRAPHY (ERCP);  Surgeon: Beryle Beams, MD;  Location: [facility] ENDOSCOPY;  Service: Endoscopy;  Laterality: N/A;  . FLEXIBLE SIGMOIDOSCOPY N/A 05/02/2014   Procedure: FLEXIBLE SIGMOIDOSCOPY;  Surgeon: Gatha Mayer, MD;  Location: [facility] ENDOSCOPY;  Service: Endoscopy;  Laterality: N/A;  . HIP ARTHROPLASTY Left 01/07/2018   Procedure: ARTHROPLASTY BIPOLAR HIP (HEMIARTHROPLASTY);  Surgeon: Hiram Gash, MD;  Location: [facility];  Service: Orthopedics;  Laterality: Left;  . TONSILLECTOMY AND ADENOIDECTOMY  ~  [place]  ~ 1960's     OB History   None      Home Medications    Prior to Admission medications   Medication Sig Start Date End Date Taking? Authorizing Provider  Carboxymethylcellulose Sod PF (EQ RESTORE PLUS LUBRICANT EYE) 0.5 % SOLN Apply 1 drop to eye daily as needed.    [provider]  dicyclomine (BENTYL) 20 MG tablet TAKE 1/2 TABLET BY MOUTH EVERY 6 HOURS AS NEEDED FOR  SPASMS(ABDOMINAL PAIN) 09/16/16   Gatha Mayer, MD  diltiazem (DILT-XR) 120 MG 24 hr capsule TAKE 1 CAPSULE(120 MG) BY MOUTH DAILY 01/04/18   Minus Breeding, MD  divalproex (DEPAKOTE SPRINKLE) 125 MG capsule Take 125 mg by mouth 2 (two) times daily.    [provider]  gabapentin (NEURONTIN) 300 MG capsule Take 300 mg by mouth 3 (three) times daily.    [provider]  glimepiride (AMARYL) 4 MG tablet Take 4 mg by mouth daily with breakfast.     [provider]  HYDROcodone-acetaminophen (NORCO/VICODIN) 5-325 MG tablet Take 1 tablet by mouth every 6 (six) hours as needed for moderate pain.    [provider]  lactose free nutrition (BOOST) LIQD Take 237 mLs by mouth as needed.    [provider]  LANTUS SOLOSTAR 100 UNIT/ML Solostar Pen Inject 22 Units into the skin daily.  09/01/13   [provider]  latanoprost (XALATAN) 0.005 % ophthalmic solution Place 1 drop into both eyes at bedtime.    [provider]  loperamide (LOPERAMIDE A-D) 2 MG tablet Take 1 tablet (2 mg total) by mouth as needed for diarrhea or loose stools (use at bedtime). Patient taking differently: Take 2 mg by mouth at bedtime.  11/27/14   Gatha Mayer, MD  LORazepam (ATIVAN) 1 MG tablet Take 1 tablet (1 mg total) by mouth 3 (three) times daily. Patient taking differently: Take 1 mg by mouth every 8 (eight) hours as needed.  01/11/18   Mikhail, Velta Addison, DO  methylphenidate (RITALIN) 5 MG tablet Take 1 tablet (5 mg total) by mouth See admin instructions. take 5mg  in the morning daily and then 5mg  twice a day on every other day Patient taking differently: Take 5 mg by mouth daily. take 5mg  in the morning daily and then 5mg  twice a day on every other day 11/01/17   Alma Friendly, MD  metoprolol tartrate (LOPRESSOR) 25 MG tablet TAKE 1 TABLET(25 MG) BY MOUTH TWICE DAILY WITH FOOD 12/13/17   Minus Breeding, MD  mirtazapine (REMERON) 7.5 MG tablet TAKE 1 TABLET(7.5  MG) BY MOUTH AT BEDTIME 10/06/16   Gatha Mayer, MD  Multiple Vitamin (MULTIVITAMIN WITH MINERALS) TABS Take 1 tablet by mouth daily.    [provider]  omeprazole (PRILOSEC) 20 MG capsule Take 20 mg by mouth 2 (two) times daily.     [provider]  oxybutynin (DITROPAN) 5 MG tablet Take 1 tablet by mouth at bedtime. 07/29/16   [provider]  promethazine (PHENERGAN) 25 MG tablet Take 25 mg by mouth 3 (three) times daily as needed for nausea or vomiting.    [provider]  sucralfate (CARAFATE) 1 g tablet Take 1 g by mouth 2 (two) times daily.    [provider]  SYMBICORT 160-4.5 MCG/ACT inhaler Inhale 2 puffs into the lungs 2 (two) times daily.  03/08/14   [provider]  timolol (TIMOPTIC) 0.5 % ophthalmic solution Place 1 drop into  the right eye.  06/02/15   [provider]  warfarin (COUMADIN) 5 MG tablet Take 0.5-1 tablets (2.5-5 mg total) by mouth daily. Take 1.25mg  every Sun/Wed then 2.5 mg the rest of the week 01/12/18   Cristal Ford, DO    Family History Family History  Problem Relation Age of Onset  . Stroke Father 72  . Stroke Mother 47  . Anesthesia problems Neg Hx   . Hypotension Neg Hx   . Malignant hyperthermia Neg Hx   . Pseudochol deficiency Neg Hx   . Stomach cancer Neg Hx   . Esophageal cancer Neg Hx     Social History Social History   Tobacco Use  . Smoking status: Never Smoker  . Smokeless tobacco: Never Used  Substance Use Topics  . Alcohol use: No  . Drug use: No     Allergies   Lactose intolerance (gi); Penicillins; Sulfa antibiotics; Ciprofloxacin; and Sulfasalazine   Review of Systems Review of Systems  Constitutional: Negative for chills, fatigue and fever.  HENT: Negative for sore throat and trouble swallowing.   Eyes: Positive for visual disturbance. Negative for pain.  Respiratory: Negative for cough and shortness of breath.   Cardiovascular: Negative for chest pain,  palpitations and leg swelling.  Gastrointestinal: Positive for nausea and vomiting. Negative for abdominal pain, constipation and diarrhea.  Genitourinary: Positive for frequency. Negative for dysuria, flank pain and hematuria.  Musculoskeletal: Positive for gait problem. Negative for arthralgias, back pain, myalgias and neck pain.  Skin: Negative for rash and wound.  Neurological: Positive for dizziness. Negative for syncope, weakness, numbness and headaches.  Psychiatric/Behavioral: The patient is nervous/anxious.   All other systems reviewed and are negative.    Physical Exam Updated Vital Signs BP (!) 153/86   Pulse 100   Temp 98.1 F (36.7 C) (Oral)   Resp 18   Ht 5' (1.524 m)   Wt 61.2 kg   SpO2 99%   BMI 26.37 kg/m   Physical Exam  Constitutional: She is oriented to person, place, and time. She appears well-developed and well-nourished.  HENT:  Head: Normocephalic and atraumatic.  Mouth/Throat: Oropharynx is clear and moist. No oropharyngeal exudate.  No obvious head trauma.  Cranial nerves II through XII grossly intact.  Eyes: Pupils are equal, round, and reactive to light. EOM are normal.  Few beats of horizontal nystagmus which appears to be fatigable.  Neck: Normal range of motion. Neck supple.  No meningismus  Cardiovascular:  Tachycardia.  Irregularly irregular.  Pulmonary/Chest: Effort normal and breath sounds normal. No stridor. No respiratory distress. She has no wheezes. She has no rales. She exhibits no tenderness.  Abdominal: Soft. Bowel sounds are normal. There is no tenderness. There is no rebound and no guarding.  Musculoskeletal: Normal range of motion. She exhibits no edema or tenderness.  No midline thoracic or lumbar tenderness.  No CVA tenderness.  No lower extremity swelling, asymmetry or tenderness.  Distal pulses intact.  Lymphadenopathy:    She has no cervical adenopathy.  Neurological: She is alert and oriented to person, place, and time.    Patient is alert and oriented x3 with clear, goal oriented speech. Patient has 5/5 motor in bilateral upper and right lower extremity.  Patient does have some weakness which she attributes to her previous hip replacement in the left lower extremity.  Sensation is intact to light touch. Bilateral finger-to-nose is slow.   Skin: Skin is warm and dry. No rash noted. No erythema.  Psychiatric: She  has a normal mood and affect. Her behavior is normal.  Nursing note and vitals reviewed.    ED Treatments / Results  Labs (all labs ordered are listed, but only abnormal results are displayed) Labs Reviewed  COMPREHENSIVE METABOLIC PANEL - Abnormal; Notable for the following components:      Result Value   Glucose, Bld 125 (*)    GFR calc non Af Amer 56 (*)    All other components within normal limits  CBC - Abnormal; Notable for the following components:   WBC 11.5 (*)    All other components within normal limits  PROTIME-INR - Abnormal; Notable for the following components:   Prothrombin Time 26.7 (*)    All other components within normal limits  CBG MONITORING, ED - Abnormal; Notable for the following components:   Glucose-Capillary 111 (*)    All other components within normal limits  LIPASE, BLOOD  I-STAT TROPONIN, ED    EKG EKG Interpretation  Date/Time:  Monday May 16 2018 16:29:04 EDT Ventricular Rate:  133 PR Interval:    QRS Duration: 80 QT Interval:  330 QTC Calculation: 491 R Axis:   30 Text Interpretation:  Atrial fibrillation Low voltage, extremity leads Minimal ST depression, inferior leads Borderline prolonged QT interval Confirmed by Julianne Rice [phone]) on 05/16/2018 8:15:07 PM Also confirmed by Julianne Rice [phone]), editor Hattie Perch ([id number])  on 05/17/2018 6:41:16 AM   Radiology Ct Head Wo Contrast  Result Date: 05/16/2018 CLINICAL DATA:  Vision problems while ambulating EXAM: CT HEAD WITHOUT CONTRAST TECHNIQUE: Contiguous axial images were  obtained from the base of the skull through the vertex without intravenous contrast. COMPARISON:  01/06/2018 FINDINGS: Brain: Diffuse atrophic changes are identified. Mild chronic white matter ischemic change is seen. No findings to suggest acute hemorrhage, acute infarction or space-occupying mass lesion are noted. Vascular: No hyperdense vessel or unexpected calcification. Skull: Normal. Negative for fracture or focal lesion. Sinuses/Orbits: No acute finding. Other: None. IMPRESSION: Chronic atrophic and ischemic changes without acute abnormality. Electronically Signed   By: Inez Catalina M.D.   On: 05/16/2018 19:59   Mr Brain Wo Contrast  Result Date: 05/16/2018 CLINICAL DATA:  84 y/o  F; vision problems. EXAM: MRI HEAD WITHOUT CONTRAST TECHNIQUE: Multiplanar, multiecho pulse sequences of the brain and surrounding structures were obtained without intravenous contrast. COMPARISON:  05/16/2018 CT head.  03/05/2004 MRI head. FINDINGS: Brain: No acute infarction, hemorrhage, hydrocephalus, extra-axial collection or mass lesion. Several nonspecific T2 FLAIR hyperintensities in subcortical and periventricular white matter are compatible with mild chronic microvascular ischemic changes for age. Moderate volume loss of the brain. Vascular: Normal flow voids. Skull and upper cervical spine: Normal marrow signal. Sinuses/Orbits: Negative. Other: Bilateral intra-ocular lens replacement. IMPRESSION: 1. No acute intracranial abnormality. 2. Mild for age chronic microvascular ischemic changes and moderate volume loss of the brain. Mild progression from 2005. Electronically Signed   By: Kristine Garbe M.D.   On: 05/16/2018 23:32    Procedures Procedures (including critical care time)  Medications Ordered in ED Medications  LORazepam (ATIVAN) injection 0.5 mg (0.5 mg Intravenous Given 05/16/18 1718)  sodium chloride 0.9 % bolus 500 mL (0 mLs Intravenous Stopped 05/16/18 1911)  metoprolol tartrate (LOPRESSOR)  injection 5 mg (5 mg Intravenous Given 05/16/18 2042)  HYDROcodone-acetaminophen (NORCO/VICODIN) 5-325 MG per tablet 1 tablet (1 tablet Oral Given 05/16/18 2240)     Initial Impression / Assessment and Plan / ED Course  I have reviewed the triage vital signs and the nursing notes.  Pertinent labs & imaging results that were available during my care of the patient were reviewed by me and considered in my medical decision making (see chart for details).     CT head without evidence of intracranial bleed.  Will get MRI to rule out posterior stroke though suspect her symptoms are likely due to peripheral vertigo.  Signed out to oncoming emergency provider pending MRI and disposition.  Final Clinical Impressions(s) / ED Diagnoses   Final diagnoses:  Visual blurriness    ED Discharge Orders    None       Julianne Rice, MD 05/17/18 1624

## 2018-05-17 ENCOUNTER — Ambulatory Visit: Payer: Self-pay | Admitting: Pharmacist

## 2018-05-17 ENCOUNTER — Other Ambulatory Visit: Payer: Self-pay | Admitting: Pharmacist

## 2018-05-17 LAB — CBG MONITORING, ED: GLUCOSE-CAPILLARY: 111 mg/dL — AB (ref 70–99)

## 2018-05-17 NOTE — Patient Outreach (Signed)
[facility]) Care Management  05/17/2018  Hilma Steinhilber [birth year] [id number]   Successful call to Ms. Licet Murray-Rush's spouse today.  Per review of [facility], patient had ED visit yesterday for abdominal pain and blurred vision.  MRI negative for stroke. Spouse reports patient is resting right now.  Patient has rescheduled GI appointment for 06/02/2018.    Medication compliance: Patient started on compliance packs last Saturday.  Per spouse, she still has several OTC pills that she takes separately.  He states they are "very happy" with the packs, and that is has reduced time spent organizing medications.  He denies having any further medication related questions at this time.  He is aware he can call [facility] for specific dispensing questions or may call me with other concerns.   Plan: I will close patient case at this time.  I am happy to assist in the future as needed.   Ralene Bathe, PharmD, [place] [phone]

## 2018-05-17 NOTE — Discharge Instructions (Addendum)
Your studies tonight do not show any stroke as the cause of your symptoms. Follow up with your doctors in the outpatient setting for ongoing evaluation to identify and treat your condition. Return to the emergency department as needed if you have any concerns.

## 2018-05-25 DIAGNOSIS — E1151 Type 2 diabetes mellitus with diabetic peripheral angiopathy without gangrene: Secondary | ICD-10-CM | POA: Diagnosis not present

## 2018-05-25 DIAGNOSIS — I10 Essential (primary) hypertension: Secondary | ICD-10-CM | POA: Diagnosis not present

## 2018-05-25 DIAGNOSIS — Z6826 Body mass index (BMI) 26.0-26.9, adult: Secondary | ICD-10-CM | POA: Diagnosis not present

## 2018-05-25 DIAGNOSIS — K529 Noninfective gastroenteritis and colitis, unspecified: Secondary | ICD-10-CM | POA: Diagnosis not present

## 2018-05-25 DIAGNOSIS — J449 Chronic obstructive pulmonary disease, unspecified: Secondary | ICD-10-CM | POA: Diagnosis not present

## 2018-05-25 DIAGNOSIS — I482 Chronic atrial fibrillation: Secondary | ICD-10-CM | POA: Diagnosis not present

## 2018-05-25 DIAGNOSIS — Z7901 Long term (current) use of anticoagulants: Secondary | ICD-10-CM | POA: Diagnosis not present

## 2018-05-31 ENCOUNTER — Ambulatory Visit: Payer: Medicare HMO | Admitting: Podiatry

## 2018-05-31 DIAGNOSIS — I482 Chronic atrial fibrillation: Secondary | ICD-10-CM | POA: Diagnosis not present

## 2018-05-31 DIAGNOSIS — R2681 Unsteadiness on feet: Secondary | ICD-10-CM | POA: Diagnosis not present

## 2018-05-31 DIAGNOSIS — Z6826 Body mass index (BMI) 26.0-26.9, adult: Secondary | ICD-10-CM | POA: Diagnosis not present

## 2018-05-31 DIAGNOSIS — I7389 Other specified peripheral vascular diseases: Secondary | ICD-10-CM | POA: Diagnosis not present

## 2018-05-31 DIAGNOSIS — I10 Essential (primary) hypertension: Secondary | ICD-10-CM | POA: Diagnosis not present

## 2018-05-31 DIAGNOSIS — J449 Chronic obstructive pulmonary disease, unspecified: Secondary | ICD-10-CM | POA: Diagnosis not present

## 2018-05-31 DIAGNOSIS — R58 Hemorrhage, not elsewhere classified: Secondary | ICD-10-CM | POA: Diagnosis not present

## 2018-05-31 DIAGNOSIS — E1151 Type 2 diabetes mellitus with diabetic peripheral angiopathy without gangrene: Secondary | ICD-10-CM | POA: Diagnosis not present

## 2018-05-31 DIAGNOSIS — R0902 Hypoxemia: Secondary | ICD-10-CM | POA: Diagnosis not present

## 2018-05-31 DIAGNOSIS — I959 Hypotension, unspecified: Secondary | ICD-10-CM | POA: Diagnosis not present

## 2018-05-31 DIAGNOSIS — R404 Transient alteration of awareness: Secondary | ICD-10-CM | POA: Diagnosis not present

## 2018-06-01 ENCOUNTER — Other Ambulatory Visit: Payer: Self-pay

## 2018-06-01 ENCOUNTER — Inpatient Hospital Stay (HOSPITAL_COMMUNITY)
Admission: EM | Admit: 2018-06-01 | Discharge: 2018-06-09 | DRG: 377 | Disposition: A | Discharge status: Skilled Nursing Facility | Payer: Medicare HMO | Attending: Internal Medicine | Admitting: Internal Medicine

## 2018-06-01 ENCOUNTER — Inpatient Hospital Stay (HOSPITAL_COMMUNITY): Payer: Medicare HMO

## 2018-06-01 ENCOUNTER — Encounter (HOSPITAL_COMMUNITY): Payer: Self-pay | Admitting: Emergency Medicine

## 2018-06-01 DIAGNOSIS — F329 Major depressive disorder, single episode, unspecified: Secondary | ICD-10-CM | POA: Diagnosis present

## 2018-06-01 DIAGNOSIS — Z9841 Cataract extraction status, right eye: Secondary | ICD-10-CM

## 2018-06-01 DIAGNOSIS — K219 Gastro-esophageal reflux disease without esophagitis: Secondary | ICD-10-CM | POA: Diagnosis present

## 2018-06-01 DIAGNOSIS — Z7951 Long term (current) use of inhaled steroids: Secondary | ICD-10-CM

## 2018-06-01 DIAGNOSIS — D120 Benign neoplasm of cecum: Secondary | ICD-10-CM | POA: Diagnosis not present

## 2018-06-01 DIAGNOSIS — I4891 Unspecified atrial fibrillation: Secondary | ICD-10-CM | POA: Diagnosis not present

## 2018-06-01 DIAGNOSIS — E739 Lactose intolerance, unspecified: Secondary | ICD-10-CM | POA: Diagnosis present

## 2018-06-01 DIAGNOSIS — E1140 Type 2 diabetes mellitus with diabetic neuropathy, unspecified: Secondary | ICD-10-CM | POA: Diagnosis present

## 2018-06-01 DIAGNOSIS — Z7902 Long term (current) use of antithrombotics/antiplatelets: Secondary | ICD-10-CM

## 2018-06-01 DIAGNOSIS — J449 Chronic obstructive pulmonary disease, unspecified: Secondary | ICD-10-CM | POA: Diagnosis not present

## 2018-06-01 DIAGNOSIS — K649 Unspecified hemorrhoids: Secondary | ICD-10-CM | POA: Diagnosis not present

## 2018-06-01 DIAGNOSIS — R55 Syncope and collapse: Secondary | ICD-10-CM | POA: Diagnosis not present

## 2018-06-01 DIAGNOSIS — I340 Nonrheumatic mitral (valve) insufficiency: Secondary | ICD-10-CM | POA: Diagnosis present

## 2018-06-01 DIAGNOSIS — Z794 Long term (current) use of insulin: Secondary | ICD-10-CM

## 2018-06-01 DIAGNOSIS — E7800 Pure hypercholesterolemia, unspecified: Secondary | ICD-10-CM | POA: Diagnosis not present

## 2018-06-01 DIAGNOSIS — K589 Irritable bowel syndrome without diarrhea: Secondary | ICD-10-CM | POA: Diagnosis present

## 2018-06-01 DIAGNOSIS — Z9842 Cataract extraction status, left eye: Secondary | ICD-10-CM

## 2018-06-01 DIAGNOSIS — N390 Urinary tract infection, site not specified: Secondary | ICD-10-CM | POA: Diagnosis present

## 2018-06-01 DIAGNOSIS — K922 Gastrointestinal hemorrhage, unspecified: Secondary | ICD-10-CM

## 2018-06-01 DIAGNOSIS — D62 Acute posthemorrhagic anemia: Secondary | ICD-10-CM | POA: Diagnosis present

## 2018-06-01 DIAGNOSIS — R42 Dizziness and giddiness: Secondary | ICD-10-CM | POA: Diagnosis not present

## 2018-06-01 DIAGNOSIS — K5730 Diverticulosis of large intestine without perforation or abscess without bleeding: Secondary | ICD-10-CM

## 2018-06-01 DIAGNOSIS — K921 Melena: Secondary | ICD-10-CM

## 2018-06-01 DIAGNOSIS — D689 Coagulation defect, unspecified: Secondary | ICD-10-CM

## 2018-06-01 DIAGNOSIS — E119 Type 2 diabetes mellitus without complications: Secondary | ICD-10-CM | POA: Diagnosis not present

## 2018-06-01 DIAGNOSIS — K625 Hemorrhage of anus and rectum: Secondary | ICD-10-CM | POA: Diagnosis present

## 2018-06-01 DIAGNOSIS — Z882 Allergy status to sulfonamides status: Secondary | ICD-10-CM

## 2018-06-01 DIAGNOSIS — Z880 Allergy status to penicillin: Secondary | ICD-10-CM

## 2018-06-01 DIAGNOSIS — Z79899 Other long term (current) drug therapy: Secondary | ICD-10-CM

## 2018-06-01 DIAGNOSIS — D649 Anemia, unspecified: Secondary | ICD-10-CM | POA: Diagnosis not present

## 2018-06-01 DIAGNOSIS — K5731 Diverticulosis of large intestine without perforation or abscess with bleeding: Principal | ICD-10-CM | POA: Diagnosis present

## 2018-06-01 DIAGNOSIS — R579 Shock, unspecified: Secondary | ICD-10-CM

## 2018-06-01 DIAGNOSIS — I10 Essential (primary) hypertension: Secondary | ICD-10-CM | POA: Diagnosis not present

## 2018-06-01 DIAGNOSIS — K648 Other hemorrhoids: Secondary | ICD-10-CM | POA: Diagnosis present

## 2018-06-01 DIAGNOSIS — I482 Chronic atrial fibrillation: Secondary | ICD-10-CM | POA: Diagnosis present

## 2018-06-01 DIAGNOSIS — F419 Anxiety disorder, unspecified: Secondary | ICD-10-CM | POA: Diagnosis present

## 2018-06-01 DIAGNOSIS — H409 Unspecified glaucoma: Secondary | ICD-10-CM | POA: Diagnosis present

## 2018-06-01 DIAGNOSIS — Z961 Presence of intraocular lens: Secondary | ICD-10-CM | POA: Diagnosis present

## 2018-06-01 DIAGNOSIS — D72829 Elevated white blood cell count, unspecified: Secondary | ICD-10-CM | POA: Diagnosis not present

## 2018-06-01 DIAGNOSIS — Z96643 Presence of artificial hip joint, bilateral: Secondary | ICD-10-CM | POA: Diagnosis present

## 2018-06-01 DIAGNOSIS — M6281 Muscle weakness (generalized): Secondary | ICD-10-CM | POA: Diagnosis not present

## 2018-06-01 DIAGNOSIS — R2689 Other abnormalities of gait and mobility: Secondary | ICD-10-CM | POA: Diagnosis not present

## 2018-06-01 DIAGNOSIS — R578 Other shock: Secondary | ICD-10-CM | POA: Diagnosis not present

## 2018-06-01 DIAGNOSIS — Z7901 Long term (current) use of anticoagulants: Secondary | ICD-10-CM | POA: Diagnosis not present

## 2018-06-01 DIAGNOSIS — K5790 Diverticulosis of intestine, part unspecified, without perforation or abscess without bleeding: Secondary | ICD-10-CM | POA: Diagnosis not present

## 2018-06-01 DIAGNOSIS — I480 Paroxysmal atrial fibrillation: Secondary | ICD-10-CM | POA: Diagnosis not present

## 2018-06-01 DIAGNOSIS — B9620 Unspecified Escherichia coli [E. coli] as the cause of diseases classified elsewhere: Secondary | ICD-10-CM | POA: Diagnosis present

## 2018-06-01 DIAGNOSIS — G9341 Metabolic encephalopathy: Secondary | ICD-10-CM | POA: Diagnosis not present

## 2018-06-01 DIAGNOSIS — R498 Other voice and resonance disorders: Secondary | ICD-10-CM | POA: Diagnosis not present

## 2018-06-01 DIAGNOSIS — R571 Hypovolemic shock: Secondary | ICD-10-CM | POA: Diagnosis not present

## 2018-06-01 DIAGNOSIS — Z888 Allergy status to other drugs, medicaments and biological substances status: Secondary | ICD-10-CM

## 2018-06-01 DIAGNOSIS — R488 Other symbolic dysfunctions: Secondary | ICD-10-CM | POA: Diagnosis not present

## 2018-06-01 LAB — CBC WITH DIFFERENTIAL/PLATELET
ABS IMMATURE GRANULOCYTES: 0.1 10*3/uL (ref 0.0–0.1)
Abs Immature Granulocytes: 0.2 10*3/uL — ABNORMAL HIGH (ref 0.0–0.1)
Band Neutrophils: 0 %
Basophils Absolute: 0 10*3/uL (ref 0.0–0.1)
Basophils Absolute: 0.1 10*3/uL (ref 0.0–0.1)
Basophils Absolute: 0.1 10*3/uL (ref 0.0–0.1)
Basophils Relative: 0 %
Basophils Relative: 1 %
Basophils Relative: 1 %
Blasts: 0 %
EOS ABS: 0.1 10*3/uL (ref 0.0–0.7)
EOS ABS: 0.4 10*3/uL (ref 0.0–0.7)
EOS PCT: 0 %
EOS PCT: 2 %
Eosinophils Absolute: 0.1 10*3/uL (ref 0.0–0.7)
Eosinophils Relative: 1 %
HCT: 16.3 % — ABNORMAL LOW (ref 36.0–46.0)
HCT: 41 % (ref 36.0–46.0)
HEMATOCRIT: 41.7 % (ref 36.0–46.0)
HEMOGLOBIN: 13.6 g/dL (ref 12.0–15.0)
Hemoglobin: 5.2 g/dL — CL (ref 12.0–15.0)
Hemoglobin: 13.5 g/dL (ref 12.0–15.0)
IMMATURE GRANULOCYTES: 1 %
IMMATURE GRANULOCYTES: 1 %
LYMPHS ABS: 3.4 10*3/uL (ref 0.7–4.0)
LYMPHS ABS: 4.7 10*3/uL — AB (ref 0.7–4.0)
Lymphocytes Relative: 24 %
Lymphocytes Relative: 26 %
Lymphocytes Relative: 17 %
Lymphs Abs: 2.3 10*3/uL (ref 0.7–4.0)
MCH: 29.2 pg (ref 26.0–34.0)
MCH: 28.3 pg (ref 26.0–34.0)
MCH: 28.2 pg (ref 26.0–34.0)
MCHC: 31.9 g/dL (ref 30.0–36.0)
MCHC: 32.6 g/dL (ref 30.0–36.0)
MCHC: 32.9 g/dL (ref 30.0–36.0)
MCV: 91.6 fL (ref 78.0–100.0)
MCV: 86.7 fL (ref 78.0–100.0)
MCV: 85.8 fL (ref 78.0–100.0)
METAMYELOCYTES PCT: 0 %
MONOS PCT: 6 %
MYELOCYTES: 0 %
Monocytes Absolute: 0.6 10*3/uL (ref 0.1–1.0)
Monocytes Absolute: 0.8 10*3/uL (ref 0.1–1.0)
Monocytes Absolute: 0.7 10*3/uL (ref 0.1–1.0)
Monocytes Relative: 3 %
Monocytes Relative: 5 %
NEUTROS ABS: 8.6 10*3/uL — AB (ref 1.7–7.7)
NEUTROS PCT: 65 %
NEUTROS PCT: 76 %
Neutro Abs: 13.7 10*3/uL — ABNORMAL HIGH (ref 1.7–7.7)
Neutro Abs: 10.4 10*3/uL — ABNORMAL HIGH (ref 1.7–7.7)
Neutrophils Relative %: 71 %
Other: 0 %
PLATELETS: 205 10*3/uL (ref 150–400)
PLATELETS: 358 10*3/uL (ref 150–400)
Platelets: 167 10*3/uL (ref 150–400)
Promyelocytes Relative: 0 %
RBC: 1.78 MIL/uL — AB (ref 3.87–5.11)
RBC: 4.78 MIL/uL (ref 3.87–5.11)
RBC: 4.81 MIL/uL (ref 3.87–5.11)
RDW: 15.4 % (ref 11.5–15.5)
RDW: 16.5 % — ABNORMAL HIGH (ref 11.5–15.5)
RDW: 15.9 % — AB (ref 11.5–15.5)
WBC: 13 10*3/uL — ABNORMAL HIGH (ref 4.0–10.5)
WBC: 13.7 10*3/uL — AB (ref 4.0–10.5)
WBC: 19.4 10*3/uL — AB (ref 4.0–10.5)
nRBC: 0 /100 WBC

## 2018-06-01 LAB — CBC
HEMATOCRIT: 43.1 % (ref 36.0–46.0)
HEMATOCRIT: 44.1 % (ref 36.0–46.0)
HEMOGLOBIN: 14.4 g/dL (ref 12.0–15.0)
Hemoglobin: 14.6 g/dL (ref 12.0–15.0)
MCH: 28.4 pg (ref 26.0–34.0)
MCH: 28.5 pg (ref 26.0–34.0)
MCHC: 33.4 g/dL (ref 30.0–36.0)
MCHC: 33.1 g/dL (ref 30.0–36.0)
MCV: 85 fL (ref 78.0–100.0)
MCV: 86.1 fL (ref 78.0–100.0)
Platelets: 186 10*3/uL (ref 150–400)
Platelets: 190 10*3/uL (ref 150–400)
RBC: 5.07 MIL/uL (ref 3.87–5.11)
RBC: 5.12 MIL/uL — AB (ref 3.87–5.11)
RDW: 16.8 % — ABNORMAL HIGH (ref 11.5–15.5)
RDW: 15.9 % — AB (ref 11.5–15.5)
WBC: 12.4 10*3/uL — ABNORMAL HIGH (ref 4.0–10.5)
WBC: 14.5 10*3/uL — AB (ref 4.0–10.5)

## 2018-06-01 LAB — COMPREHENSIVE METABOLIC PANEL
ALBUMIN: 3.3 g/dL — AB (ref 3.5–5.0)
ALT: 14 U/L (ref 0–44)
AST: 25 U/L (ref 15–41)
Alkaline Phosphatase: 80 U/L (ref 38–126)
Anion gap: 13 (ref 5–15)
BUN: 13 mg/dL (ref 8–23)
CHLORIDE: 102 mmol/L (ref 98–111)
CO2: 21 mmol/L — AB (ref 22–32)
CREATININE: 0.97 mg/dL (ref 0.44–1.00)
Calcium: 8.9 mg/dL (ref 8.9–10.3)
GFR calc Af Amer: 60 mL/min — ABNORMAL LOW (ref >60)
GFR calc non Af Amer: 51 mL/min — ABNORMAL LOW (ref >60)
Glucose, Bld: 196 mg/dL — ABNORMAL HIGH (ref 70–99)
Potassium: 3.7 mmol/L (ref 3.5–5.1)
SODIUM: 136 mmol/L (ref 135–145)
Total Bilirubin: 0.6 mg/dL (ref 0.3–1.2)
Total Protein: 6.4 g/dL — ABNORMAL LOW (ref 6.5–8.1)

## 2018-06-01 LAB — PROTIME-INR
INR: 1.23
INR: 2.58
INR: 2.74
PROTHROMBIN TIME: 27.5 s — AB (ref 11.4–15.2)
Prothrombin Time: 28.8 seconds — ABNORMAL HIGH (ref 11.4–15.2)
Prothrombin Time: 15.4 seconds — ABNORMAL HIGH (ref 11.4–15.2)

## 2018-06-01 LAB — GLUCOSE, CAPILLARY
GLUCOSE-CAPILLARY: 92 mg/dL (ref 70–99)
GLUCOSE-CAPILLARY: 132 mg/dL — AB (ref 70–99)
Glucose-Capillary: 103 mg/dL — ABNORMAL HIGH (ref 70–99)
Glucose-Capillary: 121 mg/dL — ABNORMAL HIGH (ref 70–99)
Glucose-Capillary: 123 mg/dL — ABNORMAL HIGH (ref 70–99)

## 2018-06-01 LAB — URINALYSIS, ROUTINE W REFLEX MICROSCOPIC
BILIRUBIN URINE: NEGATIVE
Glucose, UA: NEGATIVE mg/dL
KETONES UR: NEGATIVE mg/dL
Nitrite: POSITIVE — AB
PH: 6 (ref 5.0–8.0)
Protein, ur: NEGATIVE mg/dL
Specific Gravity, Urine: 1.006 (ref 1.005–1.030)

## 2018-06-01 LAB — CBG MONITORING, ED: GLUCOSE-CAPILLARY: 175 mg/dL — AB (ref 70–99)

## 2018-06-01 LAB — PREPARE RBC (CROSSMATCH)

## 2018-06-01 LAB — MRSA PCR SCREENING: MRSA by PCR: NEGATIVE

## 2018-06-01 MED ORDER — ONDANSETRON HCL 4 MG/2ML IJ SOLN: 4.0000 mg | Freq: Four times a day (QID) | INTRAMUSCULAR | Status: DC | PRN

## 2018-06-01 MED ORDER — SODIUM CHLORIDE 0.9% IV SOLUTION: Freq: Once | INTRAVENOUS | Status: AC

## 2018-06-01 MED ORDER — MOMETASONE FURO-FORMOTEROL FUM 200-5 MCG/ACT IN AERO
2.0000 | INHALATION_SPRAY | Freq: Two times a day (BID) | RESPIRATORY_TRACT | Status: DC
  Administered 2018-06-01 – 2018-06-09 (×15): 2 via RESPIRATORY_TRACT
  Filled 2018-06-01: qty 8.8

## 2018-06-01 MED ORDER — FENTANYL CITRATE (PF) 100 MCG/2ML IJ SOLN
INTRAMUSCULAR | Status: AC
  Filled 2018-06-01: qty 2

## 2018-06-01 MED ORDER — VITAMIN K1 10 MG/ML IJ SOLN
10.0000 mg | Freq: Once | INTRAVENOUS | Status: AC
  Administered 2018-06-01: 10 mg via INTRAVENOUS
  Filled 2018-06-01: qty 1

## 2018-06-01 MED ORDER — FAMOTIDINE IN NACL 20-0.9 MG/50ML-% IV SOLN
20.0000 mg | Freq: Every day | INTRAVENOUS | Status: DC
  Administered 2018-06-01: 20 mg via INTRAVENOUS
  Filled 2018-06-01: qty 50

## 2018-06-01 MED ORDER — METOCLOPRAMIDE HCL 5 MG/ML IJ SOLN
10.0000 mg | Freq: Once | INTRAMUSCULAR | Status: AC
  Administered 2018-06-01: 10 mg via INTRAVENOUS
  Filled 2018-06-01: qty 2

## 2018-06-01 MED ORDER — PROTHROMBIN COMPLEX CONC HUMAN 500 UNITS IV KIT
1627.0000 [IU] | PACK | Freq: Once | Status: AC
  Administered 2018-06-01: 1627 [IU] via INTRAVENOUS
  Filled 2018-06-01: qty 1127

## 2018-06-01 MED ORDER — LATANOPROST 0.005 % OP SOLN
1.0000 [drp] | Freq: Every day | OPHTHALMIC | Status: DC
  Administered 2018-06-01 – 2018-06-08 (×8): 1 [drp] via OPHTHALMIC
  Filled 2018-06-01 (×3): qty 2.5

## 2018-06-01 MED ORDER — HYPROMELLOSE (GONIOSCOPIC) 2.5 % OP SOLN
1.0000 [drp] | Freq: Every day | OPHTHALMIC | Status: DC | PRN
  Filled 2018-06-01: qty 15

## 2018-06-01 MED ORDER — PEG-KCL-NACL-NASULF-NA ASC-C 100 G PO SOLR
0.5000 | Freq: Once | ORAL | Status: DC
  Filled 2018-06-01: qty 1

## 2018-06-01 MED ORDER — PEG-KCL-NACL-NASULF-NA ASC-C 100 G PO SOLR: 1.0000 | Freq: Once | ORAL | Status: DC

## 2018-06-01 MED ORDER — SODIUM CHLORIDE 0.9% IV SOLUTION
Freq: Once | INTRAVENOUS | Status: AC
  Administered 2018-06-01: 11:00:00 via INTRAVENOUS

## 2018-06-01 MED ORDER — SODIUM CHLORIDE 0.9 % IV SOLN
10.0000 mL/h | Freq: Once | INTRAVENOUS | Status: AC
  Administered 2018-06-01: 10 mL/h via INTRAVENOUS

## 2018-06-01 MED ORDER — INSULIN ASPART 100 UNIT/ML ~~LOC~~ SOLN
SUBCUTANEOUS | Status: AC
  Filled 2018-06-01: qty 1

## 2018-06-01 MED ORDER — LORAZEPAM 2 MG/ML IJ SOLN: 0.5000 mg | Freq: Four times a day (QID) | INTRAMUSCULAR | Status: DC | PRN

## 2018-06-01 MED ORDER — SODIUM CHLORIDE 0.9 % IV SOLN
8.0000 mg/h | INTRAVENOUS | Status: AC
  Administered 2018-06-02: 8 mg/h via INTRAVENOUS
  Filled 2018-06-01 (×4): qty 80

## 2018-06-01 MED ORDER — INSULIN ASPART 100 UNIT/ML ~~LOC~~ SOLN
0.0000 [IU] | SUBCUTANEOUS | Status: DC
  Administered 2018-06-01: 2 [IU] via SUBCUTANEOUS
  Administered 2018-06-01 (×3): 1 [IU] via SUBCUTANEOUS
  Administered 2018-06-02: 3 [IU] via SUBCUTANEOUS
  Administered 2018-06-02 – 2018-06-04 (×5): 1 [IU] via SUBCUTANEOUS
  Administered 2018-06-04 (×2): 2 [IU] via SUBCUTANEOUS
  Administered 2018-06-05 (×2): 1 [IU] via SUBCUTANEOUS
  Administered 2018-06-05 – 2018-06-06 (×3): 2 [IU] via SUBCUTANEOUS
  Administered 2018-06-06: 1 [IU] via SUBCUTANEOUS
  Administered 2018-06-06: 2 [IU] via SUBCUTANEOUS
  Administered 2018-06-06 (×2): 1 [IU] via SUBCUTANEOUS
  Administered 2018-06-07: 2 [IU] via SUBCUTANEOUS
  Administered 2018-06-07: 1 [IU] via SUBCUTANEOUS
  Administered 2018-06-07: 2 [IU] via SUBCUTANEOUS
  Administered 2018-06-07 – 2018-06-08 (×5): 1 [IU] via SUBCUTANEOUS
  Administered 2018-06-08: 2 [IU] via SUBCUTANEOUS
  Administered 2018-06-08: 3 [IU] via SUBCUTANEOUS
  Administered 2018-06-08: 1 [IU] via SUBCUTANEOUS
  Administered 2018-06-09 (×3): 2 [IU] via SUBCUTANEOUS

## 2018-06-01 MED ORDER — CARBOXYMETHYLCELLULOSE SOD PF 0.5 % OP SOLN: 1.0000 [drp] | Freq: Every day | OPHTHALMIC | Status: DC | PRN

## 2018-06-01 MED ORDER — GLUCERNA SHAKE PO LIQD: 237.0000 mL | Freq: Two times a day (BID) | ORAL | Status: DC

## 2018-06-01 MED ORDER — FENTANYL CITRATE (PF) 100 MCG/2ML IJ SOLN: 12.5000 ug | INTRAMUSCULAR | Status: DC | PRN

## 2018-06-01 MED ORDER — PANTOPRAZOLE SODIUM 40 MG PO TBEC: 40.0000 mg | DELAYED_RELEASE_TABLET | Freq: Two times a day (BID) | ORAL | Status: DC

## 2018-06-01 MED ORDER — SODIUM CHLORIDE 0.9 % IV SOLN: INTRAVENOUS | Status: DC

## 2018-06-01 MED ORDER — ONDANSETRON HCL 4 MG PO TABS: 4.0000 mg | ORAL_TABLET | Freq: Four times a day (QID) | ORAL | Status: DC | PRN

## 2018-06-01 MED ORDER — ACETAMINOPHEN 650 MG RE SUPP: 650.0000 mg | Freq: Four times a day (QID) | RECTAL | Status: DC | PRN

## 2018-06-01 MED ORDER — SODIUM CHLORIDE 0.9 % IV SOLN
INTRAVENOUS | Status: DC
  Administered 2018-06-01 – 2018-06-03 (×5): via INTRAVENOUS

## 2018-06-01 MED ORDER — SODIUM CHLORIDE 0.9 % IV SOLN
80.0000 mg | Freq: Once | INTRAVENOUS | Status: AC
  Administered 2018-06-01: 80 mg via INTRAVENOUS
  Filled 2018-06-01: qty 80

## 2018-06-01 MED ORDER — METOCLOPRAMIDE HCL 5 MG/ML IJ SOLN
10.0000 mg | Freq: Once | INTRAMUSCULAR | Status: AC
  Administered 2018-06-02: 10 mg via INTRAVENOUS
  Filled 2018-06-01: qty 2

## 2018-06-01 MED ORDER — MIDAZOLAM HCL 2 MG/2ML IJ SOLN
INTRAMUSCULAR | Status: AC
  Filled 2018-06-01: qty 2

## 2018-06-01 MED ORDER — PANTOPRAZOLE SODIUM 40 MG IV SOLR
40.0000 mg | Freq: Once | INTRAVENOUS | Status: AC
  Filled 2018-06-01: qty 40

## 2018-06-01 MED ORDER — ORAL CARE MOUTH RINSE
15.0000 mL | Freq: Two times a day (BID) | OROMUCOSAL | Status: DC
  Administered 2018-06-01 – 2018-06-09 (×12): 15 mL via OROMUCOSAL

## 2018-06-01 MED ORDER — TIMOLOL MALEATE 0.5 % OP SOLN
1.0000 [drp] | Freq: Every day | OPHTHALMIC | Status: DC
  Administered 2018-06-01 – 2018-06-09 (×9): 1 [drp] via OPHTHALMIC
  Filled 2018-06-01 (×2): qty 5

## 2018-06-01 MED ORDER — ACETAMINOPHEN 325 MG PO TABS
650.0000 mg | ORAL_TABLET | Freq: Four times a day (QID) | ORAL | Status: DC | PRN
  Administered 2018-06-02 – 2018-06-08 (×11): 650 mg via ORAL
  Filled 2018-06-01 (×11): qty 2

## 2018-06-01 MED ORDER — PANTOPRAZOLE SODIUM 40 MG IV SOLR: 40.0000 mg | Freq: Two times a day (BID) | INTRAVENOUS | Status: DC

## 2018-06-01 MED ORDER — BISACODYL 5 MG PO TBEC
10.0000 mg | DELAYED_RELEASE_TABLET | Freq: Once | ORAL | Status: AC
  Administered 2018-06-01: 10 mg via ORAL
  Filled 2018-06-01: qty 2

## 2018-06-01 MED ORDER — SODIUM CHLORIDE 0.9% FLUSH
3.0000 mL | Freq: Two times a day (BID) | INTRAVENOUS | Status: DC
  Administered 2018-06-01 – 2018-06-09 (×14): 3 mL via INTRAVENOUS

## 2018-06-01 NOTE — ED Notes (Signed)
Patient completed 2nd unit PRBC with no adverse effect , Afebrile/VSS, denies pain/respirations unlabored , IV site intact .

## 2018-06-01 NOTE — ED Notes (Signed)
Dr. Stark Jock notified on pt.'s low Hgb result .

## 2018-06-01 NOTE — ED Notes (Signed)
Pt.is getting blood at this time 

## 2018-06-01 NOTE — Progress Notes (Signed)
Initial Nutrition Assessment  DOCUMENTATION CODES:   Not applicable  INTERVENTION:    Glucerna Shake po BID, each supplement provides 220 kcal and 10 grams of protein  NUTRITION DIAGNOSIS:   Inadequate oral intake related to poor appetite as evidenced by per patient/family report.  GOAL:   Patient will meet greater than or equal to 90% of their needs  Unmet  MONITOR:   Diet advancement, PO intake  REASON FOR ASSESSMENT:   Malnutrition Screening Tool    ASSESSMENT:   84 yo female with PMH of pancreatitis, HTN, HLD, GERD, DM-2, and COPD who was admitted on 8/28 with symptomatic anemia, syncope, related to rectal bleeding.  Patient reports usual weight of 160 lbs 1 year ago. According to recent weight encounters, patient has weighed 135-145 lbs for the past 2 years. No significant weight loss. She reports poor appetite and poor intake for the past few months. She drinks Boost diabetic supplements at home. She has trouble swallowing at times.   GI following. No rectal bleeding since admission. Diet being advanced to full liquids today.   Labs and medications reviewed.   NUTRITION - FOCUSED PHYSICAL EXAM:    Most Recent Value  Orbital Region  No depletion  Upper Arm Region  No depletion  Thoracic and Lumbar Region  No depletion  Buccal Region  No depletion  Temple Region  Mild depletion  Clavicle Bone Region  No depletion  Clavicle and Acromion Bone Region  No depletion  Scapular Bone Region  No depletion  Dorsal Hand  No depletion  Patellar Region  No depletion  Anterior Thigh Region  No depletion  Posterior Calf Region  No depletion  Edema (RD Assessment)  None  Hair  Reviewed  Eyes  Reviewed  Mouth  Reviewed  Skin  Reviewed  Nails  Reviewed       Diet Order:   Diet Order            Diet full liquid Room service appropriate? Yes; Fluid consistency: Thin  Diet effective now              EDUCATION NEEDS:   No education needs have been  identified at this time  Skin:  Skin Assessment: Reviewed RN Assessment  Last BM:  8/27  Height:   Ht Readings from Last 1 Encounters:  06/01/18 5' (1.524 m)    Weight:   Wt Readings from Last 1 Encounters:  06/01/18 62 kg    Ideal Body Weight:  45.5 kg  BMI:  Body mass index is 26.69 kg/m.  Estimated Nutritional Needs:   Kcal:  1400-1600  Protein:  65-75 gm  Fluid:  1.4-1.6 L    Molli Barrows, RD, LDN, CNSC Pager [phone] After Hours Pager [phone]

## 2018-06-01 NOTE — ED Notes (Signed)
1st unit PRBC completed with no adverse effect .

## 2018-06-01 NOTE — ED Notes (Signed)
2nd unit PRBC started at 120 ml/hr , VSS/Afebrile , denies pain/respirations unlabored , IV sites unremarkable .

## 2018-06-01 NOTE — ED Notes (Signed)
Patient's spouse signed consent form for blood transfusion of pt.

## 2018-06-01 NOTE — Procedures (Signed)
Central Venous Catheter Insertion Procedure Note [name] [name] [id number] 07-17-1930  Procedure: Insertion of Central Venous Catheter, Large-bore catheter, Cordis  Indications: Drug and/or fluid administration  Procedure Details Consent: Risks of procedure as well as the alternatives and risks of each were explained to the (patient/caregiver).  Consent for procedure obtained. Verbal consent from husband, urgent line placement   Time Out: Verified patient identification, verified procedure, site/side was marked, verified correct patient position, special equipment/implants available, medications/allergies/relevent history reviewed, required imaging and test results available.  Performed  Maximum sterile technique was used including antiseptics, cap, gloves, gown, hand hygiene, mask and sheet. Skin prep: Chlorhexidine; local anesthetic administered A antimicrobial bonded/coated single lumen catheter was placed in the right femoral vein due to emergent situation using the Seldinger technique. Ultrasound was used for real time vascular access.   Evaluation Blood flow good Complications: No apparent complications Patient did tolerate procedure well. Chest X-ray ordered to verify placement.  CXR: not needed.  Octavio Graves Cally Nygard 06/01/2018, 5:33 PM

## 2018-06-01 NOTE — Progress Notes (Signed)
Pt having large bloody stools with clots. RN went in to clean pt. Up. Pt briefly went into V-fib and briefly lost consciousness. Pt regained consciousness with agonal breathing and was able to state her name and where she was. CCM was on the unit and notified of pts status. 2U PRBCs ordered stat along with Kcentra and vitamin K to reverse INR. Stat H/H ordered now and 2 hours post blood transfusion. Maintenance fluids started.

## 2018-06-01 NOTE — Consult Note (Signed)
[name] [name]  RKY:[id number] DOB: [birth year] DOA: 06/01/2018 PCP: [name], MD    LOS: 0 days   Reason for Consult / Chief Complaint:  Hypovolemic shock, acute GI bleeding  Consulting MD and date of consult:  Dr. Tyrell Antonio, 06/01/2018   HPI/summary of hospital stay:  This is a 84 year old female that was admitted on 06/01/2018 to [facility] with a past medical history of chronic atrial fibrillation on Coumadin, anxiety, insulin-dependent diabetes, COPD.  She presented to the emergency department with an episode of syncope and rectal bleeding.  Critical care was called to the bedside urgently for evaluation of acute hemodynamic change.  The patient became bradycardic into the 30s with agonal respirations.  She was given aggressive fluid resuscitation and blood product.  She was found to have a very large bloody bowel movement within the bed.  Her symptoms were suspect to syncope.  She was initially found to have a heme globin 5.2 in the ER was given blood products and repleted up to 14.  This was repeated and was considered stable.  Her INR at the time was greater than 2.5.  She was not reversed for her Coumadin.  And was transferred to the stepdown unit for evaluation by GI as she at the time was hemodynamically stable.  Due to the hemodynamic compromise and the development of hemorrhagic shock critical care was consulted for recommendations and management.  Subjective  Patient states that she feels weak and tired.  But is able to converse in complete sentences.  She denies abdominal pain.  Consultants: date of consult/date signed off (if applicable)/final recs   Gastroenterology-06/01/2018  Procedures: 06/01/2018-right femoral Cordis  Significant Diagnostic Tests: N/a  Micro Data: None  Antimicrobials:  None   Objective    Examination: General appearance: 84 y.o., female, lethargic, tachyapenic, ill appearing but conversant  Eyes: anicteric sclerae, moist  conjunctivae; no lid-lag; PERRL, tracking appropriately HENT: NCAT; oropharynx, MMM Neck: Trachea midline; supple, no lymphadenopathy, no JVD Lungs: CTAB, no crackles, no wheeze, with normal respiratory effort and no intercostal retractions, tachypneic CV: RRR, S1, S2, no MRGs  Abdomen: Soft, non-tender; non-distended, bowel sounds active and present Significant amount of dark rectal bleeding approximately 2 to 3 units of blood within the bed. Extremities: No peripheral edema or radial and DP pulses present bilaterally  Skin: Normal temperature, turgor and texture; no rash Psych: Appropriate affect Neuro: Alert and oriented to person and place, no focal deficit   Blood pressure 109/69, pulse 100, temperature 97.9 F (36.6 C), temperature source Oral, resp. rate 19, height 5' (1.524 m), weight 62 kg, SpO2 96 %.    FiO2 (%):  [2 %] 2 %   Intake/Output Summary (Last 24 hours) at 06/01/2018 1744 Last data filed at 06/01/2018 1500 Gross per 24 hour  Intake 1547.74 ml  Output -  Net 1547.74 ml   Filed Weights   06/01/18 0047 06/01/18 0816  Weight: 63.5 kg 62 kg    Labs    CBC: Recent Labs  Lab 06/01/18 0051 06/01/18 0614 06/01/18 0820 06/01/18 1719  WBC 19.4* 13.0* 12.4* 14.5*  NEUTROABS 13.7* 8.6*  --   --   HGB 5.2* 13.6 14.4 14.6  HCT 16.3* 41.7 43.1 44.1  MCV 91.6 86.7 85.0 86.1  PLT 358 205 186 315   Basic Metabolic Panel: Recent Labs  Lab 06/01/18 0051  NA 136  K 3.7  CL 102  CO2 21*  GLUCOSE 196*  BUN 13  CREATININE 0.97  CALCIUM 8.9   GFR: Estimated Creatinine Clearance: 34.2 mL/min (by C-G formula based on SCr of 0.97 mg/dL). Recent Labs  Lab 06/01/18 0051 06/01/18 0614 06/01/18 0820 06/01/18 1719  WBC 19.4* 13.0* 12.4* 14.5*   Liver Function Tests: Recent Labs  Lab 06/01/18 0051  AST 25  ALT 14  ALKPHOS 80  BILITOT 0.6  PROT 6.4*  ALBUMIN 3.3*   No results for input(s): LIPASE, AMYLASE in the last 168 hours. No results for input(s):  AMMONIA in the last 168 hours. ABG No results found for: PHART, PCO2ART, PO2ART, HCO3, TCO2, ACIDBASEDEF, O2SAT  Coagulation Profile: Recent Labs  Lab 06/01/18 0051 06/01/18 0614  INR 2.74 2.58   Cardiac Enzymes: No results for input(s): CKTOTAL, CKMB, CKMBINDEX, TROPONINI in the last 168 hours. HbA1C: Hgb A1c MFr Bld  Date/Time Value Ref Range Status  01/07/2018 11:23 AM 7.1 (H) 4.8 - 5.6 % Final    Comment:    (NOTE) Pre diabetes:          5.7%-6.4% Diabetes:              >6.4% Glycemic control for   <7.0% adults with diabetes   03/11/2014 02:57 AM 7.1 (H) <5.7 % Final    Comment:    (NOTE)                                                                       According to the ADA Clinical Practice Recommendations for 2011, when HbA1c is used as a screening test:  >=6.5%   Diagnostic of Diabetes Mellitus           (if abnormal result is confirmed) 5.7-6.4%   Increased risk of developing Diabetes Mellitus References:Diagnosis and Classification of Diabetes Mellitus,Diabetes EGBT,5176,16(WVPXT 1):S62-S69 and Standards of Medical Care in         Diabetes - 2011,Diabetes GGYI,9485,46 (Suppl 1):S11-S61.   CBG: Recent Labs  Lab 06/01/18 0359 06/01/18 0818 06/01/18 1223 06/01/18 1511  GLUCAP 175* 92 121* 123*    Review of Systems:   Review of Systems  Constitutional: Negative for chills, fever, malaise/fatigue and weight loss.  HENT: Negative for hearing loss, sore throat and tinnitus.   Eyes: Negative for blurred vision and double vision.  Respiratory: Negative for cough, hemoptysis, sputum production, shortness of breath, wheezing and stridor.   Cardiovascular: Negative for chest pain, palpitations, orthopnea, leg swelling and PND.  Gastrointestinal: Positive for blood in stool, diarrhea and melena. Negative for abdominal pain, heartburn, nausea and vomiting.  Genitourinary: Negative for dysuria, hematuria and urgency.  Musculoskeletal: Negative for joint pain and  myalgias.  Skin: Negative for itching and rash.  Neurological: Negative for dizziness, tingling, weakness and headaches.  Endo/Heme/Allergies: Negative for environmental allergies. Does not bruise/bleed easily.  Psychiatric/Behavioral: Negative for depression. The patient is not nervous/anxious and does not have insomnia.   All other systems reviewed and are negative.  Past medical history  She,  has a past medical history of Atrial fibrillation ([place]), C. difficile colitis (09/21/2013, 11/2013), Carotid stenosis, Chronic lower back pain, COPD (chronic obstructive pulmonary disease) ([place]), Depression, GERD (gastroesophageal reflux disease), Glaucoma, HCAP (healthcare-associated pneumonia) (11/21/2011), High cholesterol, Hypertension, Metabolic encephalopathy (11/11/348), Mitral regurgitation (03/11/2012), Pancreatitis, Pneumonia (12/2011), Sepsis ([place]) (11/27/2013), SIRS (systemic inflammatory  response syndrome) ([place]) (03/13/2012), and Type II diabetes mellitus ([place]).   Surgical History    Past Surgical History:  Procedure Laterality Date  . APPENDECTOMY  ~ 1960  . CATARACT EXTRACTION W/ INTRAOCULAR LENS  IMPLANT, BILATERAL  2000's  . CHOLECYSTECTOMY  1990's   Lap chole  . COLONOSCOPY  01/2003   diverticulosis, 59mm hyperplastic sigmoid polyp  . DILATION AND CURETTAGE OF UTERUS    . ERCP  11/13/2011   Procedure: ENDOSCOPIC RETROGRADE CHOLANGIOPANCREATOGRAPHY (ERCP);  Surgeon: Beryle Beams, MD;  Location: [facility] ENDOSCOPY;  Service: Endoscopy;  Laterality: N/A;  . FLEXIBLE SIGMOIDOSCOPY N/A 05/02/2014   Procedure: FLEXIBLE SIGMOIDOSCOPY;  Surgeon: Gatha Mayer, MD;  Location: [facility] ENDOSCOPY;  Service: Endoscopy;  Laterality: N/A;  . HIP ARTHROPLASTY Left 01/07/2018   Procedure: ARTHROPLASTY BIPOLAR HIP (HEMIARTHROPLASTY);  Surgeon: Hiram Gash, MD;  Location: [place];  Service: Orthopedics;  Laterality: Left;  . TONSILLECTOMY AND ADENOIDECTOMY  ~ 1939  . VAGINAL HYSTERECTOMY  ~ 1960's     Social History     Social History   Socioeconomic History  . Marital status: Married    Spouse name: [name]  . Number of children: 0  . Years of education: Not on file  . Highest education level: Not on file  Occupational History  . Not on file  Social Needs  . Financial resource strain: Not on file  . Food insecurity:    Worry: Not on file    Inability: Not on file  . Transportation needs:    Medical: Not on file    Non-medical: Not on file  Tobacco Use  . Smoking status: Never Smoker  . Smokeless tobacco: Never Used  Substance and Sexual Activity  . Alcohol use: No  . Drug use: No  . Sexual activity: Not Currently  Lifestyle  . Physical activity:    Days per week: Not on file    Minutes per session: Not on file  . Stress: Not on file  Relationships  . Social connections:    Talks on phone: Not on file    Gets together: Not on file    Attends religious service: Not on file    Active member of club or organization: Not on file    Attends meetings of clubs or organizations: Not on file    Relationship status: Not on file  . Intimate partner violence:    Fear of current or ex partner: Not on file    Emotionally abused: Not on file    Physically abused: Not on file    Forced sexual activity: Not on file  Other Topics Concern  . Not on file  Social History Narrative   Lives at home with her husband.   No children  ,  reports that she has never smoked. She has never used smokeless tobacco. She reports that she does not drink alcohol or use drugs.   Family history   Her family history includes Stroke (age of onset: 65) in her mother; Stroke (age of onset: 46) in her father. There is no history of Anesthesia problems, Hypotension, Malignant hyperthermia, Pseudochol deficiency, Stomach cancer, or Esophageal cancer.   Allergies Allergies  Allergen Reactions  . Lactose Intolerance (Gi) Nausea And Vomiting and Other (See Comments)    severe stomach pain  . Penicillins Hives and Itching     "haven't used any since 1970's" Has patient had a PCN reaction causing immediate rash, facial/tongue/throat swelling, SOB or lightheadedness with hypotension: Yes Has patient  had a PCN reaction causing severe rash involving mucus membranes or skin necrosis: Unk Has patient had a PCN reaction that required hospitalization: Unk Has patient had a PCN reaction occurring within the last 10 years: No If all of the above answers are "NO", then may proceed with Cephalosporin use.   . Sulfa Antibiotics Hives and Itching    "haven't used any since 1970's"  . Ciprofloxacin Swelling    Site of swelling not recalled  . Sulfasalazine Hives and Itching    "haven't used any since 1970's"    Home meds  Prior to Admission medications   Medication Sig Start Date End Date Taking? Authorizing Provider  diltiazem (DILT-XR) 120 MG 24 hr capsule TAKE 1 CAPSULE(120 MG) BY MOUTH DAILY Patient taking differently: Take 120 mg by mouth daily. TAKE 1 CAPSULE(120 MG) BY MOUTH DAILY 01/04/18  Yes Minus Breeding, MD  gabapentin (NEURONTIN) 300 MG capsule Take 300 mg by mouth daily.    Yes [provider]  glimepiride (AMARYL) 4 MG tablet Take 4 mg by mouth daily with breakfast.    Yes [provider]  HYDROcodone-acetaminophen (NORCO/VICODIN) 5-325 MG tablet Take 1 tablet by mouth every 6 (six) hours as needed for moderate pain.   Yes [provider]  LANTUS SOLOSTAR 100 UNIT/ML Solostar Pen Inject 22 Units into the skin daily.  09/01/13  Yes [provider]  latanoprost (XALATAN) 0.005 % ophthalmic solution Place 1 drop into both eyes at bedtime.   Yes [provider]  LORazepam (ATIVAN) 1 MG tablet Take 1 tablet (1 mg total) by mouth 3 (three) times daily. Patient taking differently: Take 1 mg by mouth every 8 (eight) hours as needed.  01/11/18  Yes Mikhail, [place], DO  methylphenidate (RITALIN) 5 MG tablet Take 1 tablet (5 mg total) by mouth See admin instructions. take 5mg   in the morning daily and then 5mg  twice a day on every other day Patient taking differently: Take 5 mg by mouth daily. take 5mg  in the morning daily and then 5mg  twice a day on every other day 11/01/17  Yes Ezenduka, Adline Peals, MD  metoprolol tartrate (LOPRESSOR) 25 MG tablet TAKE 1 TABLET(25 MG) BY MOUTH TWICE DAILY WITH FOOD Patient taking differently: Take 25 mg by mouth 2 (two) times daily.  12/13/17  Yes Minus Breeding, MD  mirtazapine (REMERON) 7.5 MG tablet TAKE 1 TABLET(7.5 MG) BY MOUTH AT BEDTIME Patient taking differently: Take 7.5 mg by mouth at bedtime.  10/06/16  Yes Gatha Mayer, MD  omeprazole (PRILOSEC) 20 MG capsule Take 20 mg by mouth 2 (two) times daily.    Yes [provider]  oxybutynin (DITROPAN) 5 MG tablet Take 1 tablet by mouth at bedtime. 07/29/16  Yes [provider]  SYMBICORT 160-4.5 MCG/ACT inhaler Inhale 2 puffs into the lungs 2 (two) times daily.  03/08/14  Yes [provider]  warfarin (COUMADIN) 5 MG tablet Take 0.5-1 tablets (2.5-5 mg total) by mouth daily. Take 1.25mg  every Sun/Wed then 2.5 mg the rest of the week Patient taking differently: Take 2.5-5 mg by mouth daily.  01/12/18  Yes Mikhail, Clinical biochemist, DO  Carboxymethylcellulose Sod PF (EQ RESTORE PLUS LUBRICANT EYE) 0.5 % SOLN Apply 1 drop to eye daily as needed.    [provider]  divalproex (DEPAKOTE SPRINKLE) 125 MG capsule Take 125 mg by mouth 2 (two) times daily.    [provider]  lactose free nutrition (BOOST) LIQD Take 237 mLs by mouth as needed.  [provider]  loperamide (LOPERAMIDE A-D) 2 MG tablet Take 1 tablet (2 mg total) by mouth as needed for diarrhea or loose stools (use at bedtime). Patient taking differently: Take 2 mg by mouth at bedtime.  11/27/14   Gatha Mayer, MD  Multiple Vitamin (MULTIVITAMIN WITH MINERALS) TABS Take 1 tablet by mouth daily.    [provider]  promethazine (PHENERGAN) 25 MG tablet Take 25 mg by mouth  3 (three) times daily as needed for nausea or vomiting.    [provider]  sucralfate (CARAFATE) 1 g tablet Take 1 g by mouth 2 (two) times daily.    [provider]     Imaging:  CXR - no active disease, looks clear The patient's images have been independently reviewed by me.   Assessment & Plan:   Acute hemorrhagic shock, hypovolemic shock Acute blood loss anemia, presumed active lower GI bleeding Coagulopathy secondary to Coumadin, vitamin K antagonist Chronic atrial fibrillation, with occasional runs of atrial fibrillation with RVR History of COPD History of type 2 diabetes  Plan: Active blood product and fluid resuscitation secondary to shock Patient was given full dose [place] (Kcentra) as well as vitamin K for VK antagonist reversal in the setting of life-threatening bleeding. She will need stat repeat labs now with a repeat hemoglobin/CBC in 2 hours We have discussed management with GI at the bedside regarding needs for endoscopy once hemodynamically stable Femoral Cordis was placed for resuscitation.  Once resuscitation needs are done can insert triple-lumen catheter through cordis for more access points if needed (The cap was placed sterilely).  However would leave the Cordis in place for this time if she has additional episodes of rebleeding.  Please see separate procedure note. PPI bolus IV twice daily, or drip per GI recs Bowel prep per GI recs   CCM will follow   Best Practice / Goals of Care / Disposition.   DVT prophylaxis: Holding secondary to bleeding GI prophylaxis: PPI bolus Diet: N.p.o. Mobility: In bed ICU Code Status: Full Family Communication: Updated husband as well as family at bedside  Disposition / Summary of Today's Plan 06/01/18   Active fluid resuscitation and blood product resuscitation  This patient is critically ill with multiple organ system failure; which, requires frequent high complexity decision making, assessment, support,  evaluation, and titration of therapies. This was completed through the application of advanced monitoring technologies and extensive interpretation of multiple databases. During this encounter critical care time was devoted to patient care services described in this note for 40 minutes.   Garner Nash, DO [facility] 06/01/2018 6:08 PM  Personal pager: [phone] If unanswered, please page CCM On-call: [phone]

## 2018-06-01 NOTE — ED Notes (Addendum)
1st unit PRBC infusing at 278ml/hr with no adverse effect , respirations unlabored / afebrile , VSS.

## 2018-06-01 NOTE — ED Provider Notes (Signed)
[facility] EMERGENCY DEPARTMENT Provider Note   CSN: [id number] Arrival date & time: 06/01/18  0045     History   Chief Complaint Chief Complaint  Patient presents with  . Rectal Bleeding    HPI [name] [name] is a 84 y.o. female.  Patient is an 84 year old female with history of atrial fibrillation for which she is on Coumadin, COPD, hypertension.  She presents today for evaluation of rectal bleeding.  She reports having to bowel movements this evening that were exclusively bright red blood and no stool.  She denies any pain, however did experience a syncopal spell after the second episode.  She reports for the past few days noticing something poking out of her rectum which she has pushed back in.  She denies any fevers, chills, or abdominal pain.  Her GI doctor is Dr. Carlean Purl.  She reports having a colonoscopy approximately 2 years ago which she recalls as being unremarkable.  The history is provided by the patient.  Rectal Bleeding  Quality:  Bright red Amount:  Moderate Duration:  2 hours Timing:  Intermittent Chronicity:  New Context: hemorrhoids   Context: not rectal pain   Relieved by:  Nothing Worsened by:  Defecation Ineffective treatments:  None tried Associated symptoms: dizziness     Past Medical History:  Diagnosis Date  . Atrial fibrillation (HCC)    RVR hx.  Chronic Coumadin  . C. difficile colitis 09/21/2013, 11/2013  . Carotid stenosis    a. Carotid US (10/2013):  bilat 1-39%; f/u 1 year  . Chronic lower back pain   . COPD (chronic obstructive pulmonary disease) ([place])   . Depression   . GERD (gastroesophageal reflux disease)    with HH  . Glaucoma   . HCAP (healthcare-associated pneumonia) 11/21/2011  . High cholesterol   . Hypertension   . Metabolic encephalopathy 11/06/9796  . Mitral regurgitation 03/11/2012  . Pancreatitis    ~2008, 11/2011 post ERCP  . Pneumonia 12/2011   "first time I know about"  . Sepsis ([place]) 11/27/2013   . SIRS (systemic inflammatory response syndrome) ([place]) 03/13/2012   a/w post ERCP  pancreatitis.   . Type II diabetes mellitus [facility])     Patient Active Problem List   Diagnosis Date Noted  . DM neuropathy, type II diabetes mellitus ([place]) 01/07/2018  . Hip fracture ([place]) 01/06/2018  . Advance care planning   . Goals of care, counseling/discussion   . COPD (chronic obstructive pulmonary disease) ([place]) 10/28/2017  . RLQ abdominal pain 04/24/2014  . Nausea alone 04/11/2014  . Abdominal pain 08/05/2012    Class: Acute  . Irritable bowel syndrome 04/12/2012  . Chronic diarrhea 04/12/2012  . Encounter for long-term (current) use of anticoagulants 03/17/2012  . Depression 03/13/2012  . Anxiety 03/13/2012  . Mitral regurgitation 03/11/2012  . Chronic epigastric pain 01/25/2012  . Atrial fibrillation    . Hypokalemia 11/26/2011  . UTI (urinary tract infection) 11/13/2011  . DM II (diabetes mellitus, type II), controlled ([place]) 11/13/2011  . HTN (hypertension) 11/13/2011  . Hypercholesteremia 11/13/2011  . Glaucoma (increased eye pressure) 11/13/2011    Past Surgical History:  Procedure Laterality Date  . APPENDECTOMY  ~ 1960  . CATARACT EXTRACTION W/ INTRAOCULAR LENS  IMPLANT, BILATERAL  2000's  . CHOLECYSTECTOMY  1990's   Lap chole  . COLONOSCOPY  01/2003   diverticulosis, 20mm hyperplastic sigmoid polyp  . DILATION AND CURETTAGE OF UTERUS    . ERCP  11/13/2011   Procedure: ENDOSCOPIC RETROGRADE  CHOLANGIOPANCREATOGRAPHY (ERCP);  Surgeon: Beryle Beams, MD;  Location[facility] ENDOSCOPY;  Service: Endoscopy;  Laterality: N/A;  . FLEXIBLE SIGMOIDOSCOPY N/A 05/02/2014   Procedure: FLEXIBLE SIGMOIDOSCOPY;  Surgeon: Gatha Mayer, MD;  Location: [facility] ENDOSCOPY;  Service: Endoscopy;  Laterality: N/A;  . HIP ARTHROPLASTY Left 01/07/2018   Procedure: ARTHROPLASTY BIPOLAR HIP (HEMIARTHROPLASTY);  Surgeon: Hiram Gash, MD;  Location: [facility];  Service: Orthopedics;  Laterality: Left;  . TONSILLECTOMY  AND ADENOIDECTOMY  ~ 1939  . VAGINAL HYSTERECTOMY  ~ 1960's     OB History   None      Home Medications    Prior to Admission medications   Medication Sig Start Date End Date Taking? Authorizing Provider  Carboxymethylcellulose Sod PF (EQ RESTORE PLUS LUBRICANT EYE) 0.5 % SOLN Apply 1 drop to eye daily as needed.    [provider]  dicyclomine (BENTYL) 20 MG tablet TAKE 1/2 TABLET BY MOUTH EVERY 6 HOURS AS NEEDED FOR SPASMS(ABDOMINAL PAIN) 09/16/16   Gatha Mayer, MD  diltiazem (DILT-XR) 120 MG 24 hr capsule TAKE 1 CAPSULE(120 MG) BY MOUTH DAILY 01/04/18   Minus Breeding, MD  divalproex (DEPAKOTE SPRINKLE) 125 MG capsule Take 125 mg by mouth 2 (two) times daily.    [provider]  gabapentin (NEURONTIN) 300 MG capsule Take 300 mg by mouth 3 (three) times daily.    [provider]  glimepiride (AMARYL) 4 MG tablet Take 4 mg by mouth daily with breakfast.     [provider]  HYDROcodone-acetaminophen (NORCO/VICODIN) 5-325 MG tablet Take 1 tablet by mouth every 6 (six) hours as needed for moderate pain.    [provider]  lactose free nutrition (BOOST) LIQD Take 237 mLs by mouth as needed.    [provider]  LANTUS SOLOSTAR 100 UNIT/ML Solostar Pen Inject 22 Units into the skin daily.  09/01/13   [provider]  latanoprost (XALATAN) 0.005 % ophthalmic solution Place 1 drop into both eyes at bedtime.    [provider]  loperamide (LOPERAMIDE A-D) 2 MG tablet Take 1 tablet (2 mg total) by mouth as needed for diarrhea or loose stools (use at bedtime). Patient taking differently: Take 2 mg by mouth at bedtime.  11/27/14   Gatha Mayer, MD  LORazepam (ATIVAN) 1 MG tablet Take 1 tablet (1 mg total) by mouth 3 (three) times daily. Patient taking differently: Take 1 mg by mouth every 8 (eight) hours as needed.  01/11/18   Mikhail, Velta Addison, DO  methylphenidate (RITALIN) 5 MG tablet Take 1 tablet (5 mg total) by mouth  See admin instructions. take 5mg  in the morning daily and then 5mg  twice a day on every other day Patient taking differently: Take 5 mg by mouth daily. take 5mg  in the morning daily and then 5mg  twice a day on every other day 11/01/17   Alma Friendly, MD  metoprolol tartrate (LOPRESSOR) 25 MG tablet TAKE 1 TABLET(25 MG) BY MOUTH TWICE DAILY WITH FOOD 12/13/17   Minus Breeding, MD  mirtazapine (REMERON) 7.5 MG tablet TAKE 1 TABLET(7.5 MG) BY MOUTH AT BEDTIME 10/06/16   Gatha Mayer, MD  Multiple Vitamin (MULTIVITAMIN WITH MINERALS) TABS Take 1 tablet by mouth daily.    [provider]  omeprazole (PRILOSEC) 20 MG capsule Take 20 mg by mouth 2 (two) times daily.     [provider]  oxybutynin (DITROPAN) 5 MG tablet Take 1 tablet by mouth at bedtime. 07/29/16   [provider]  promethazine (PHENERGAN) 25 MG tablet Take 25 mg by mouth 3 (three) times daily as needed for nausea or vomiting.    [provider]  sucralfate (CARAFATE) 1 g tablet Take 1 g by mouth 2 (two) times daily.    [provider]  SYMBICORT 160-4.5 MCG/ACT inhaler Inhale 2 puffs into the lungs 2 (two) times daily.  03/08/14   [provider]  timolol (TIMOPTIC) 0.5 % ophthalmic solution Place 1 drop into the right eye.  06/02/15   [provider]  warfarin (COUMADIN) 5 MG tablet Take 0.5-1 tablets (2.5-5 mg total) by mouth daily. Take 1.25mg  every Sun/Wed then 2.5 mg the rest of the week 01/12/18   Cristal Ford, DO    Family History Family History  Problem Relation Age of Onset  . Stroke Father 39  . Stroke Mother 30  . Anesthesia problems Neg Hx   . Hypotension Neg Hx   . Malignant hyperthermia Neg Hx   . Pseudochol deficiency Neg Hx   . Stomach cancer Neg Hx   . Esophageal cancer Neg Hx     Social History Social History   Tobacco Use  . Smoking status: Never Smoker  . Smokeless tobacco: Never Used  Substance Use Topics  . Alcohol use: No  .  Drug use: No     Allergies   Lactose intolerance (gi); Penicillins; Sulfa antibiotics; Ciprofloxacin; and Sulfasalazine   Review of Systems Review of Systems  Gastrointestinal: Positive for hematochezia.  Neurological: Positive for dizziness.  All other systems reviewed and are negative.    Physical Exam Updated Vital Signs BP (!) 111/59   Pulse 88   Temp 97.7 F (36.5 C) (Oral)   Resp (!) 30   Ht 5\' 1"  (1.549 m)   Wt 63.5 kg   SpO2 96%   BMI 26.45 kg/m   Physical Exam  Constitutional: She is oriented to person, place, and time. She appears well-developed and well-nourished. No distress.  HENT:  Head: Normocephalic and atraumatic.  Neck: Normal range of motion. Neck supple.  Cardiovascular: Normal rate and regular rhythm. Exam reveals no gallop and no friction rub.  No murmur heard. Pulmonary/Chest: Effort normal and breath sounds normal. No respiratory distress. She has no wheezes.  Abdominal: Soft. Bowel sounds are normal. She exhibits no distension. There is no tenderness.  Genitourinary:  Genitourinary Comments: There is a hemorrhoid noted, but no active bleeding.  Rectal examination reveals no obvious masses.  Musculoskeletal: Normal range of motion.  Neurological: She is alert and oriented to person, place, and time.  Skin: Skin is warm and dry. She is not diaphoretic.  Nursing note and vitals reviewed.    ED Treatments / Results  Labs (all labs ordered are listed, but only abnormal results are displayed) Labs Reviewed  CBC WITH DIFFERENTIAL/PLATELET  COMPREHENSIVE METABOLIC PANEL  PROTIME-INR  TYPE AND SCREEN    EKG None  Radiology No results found.  Procedures Procedures (including critical care time)  Medications Ordered in ED Medications - No data to display   Initial Impression / Assessment and Plan / ED Course  I have reviewed the triage vital signs and the nursing notes.  Pertinent labs & imaging results that were available during  my care of the patient were reviewed by me and considered in my medical decision making (see chart for details).  Patient presents with rectal bleeding.  Hemoglobin is 5.2, however she appears hemodynamically stable with no tachycardia or hypotension.  She did experience a syncopal  episode prior to coming here.  She will be transfused 2 units of packed red blood cells and admitted to the hospitalist service.  I have spoken with Dr. Hilarie Fredrickson from GI who is in agreement with this care plan.  CRITICAL CARE Performed by: Veryl Speak Total critical care time: 35 minutes Critical care time was exclusive of separately billable procedures and treating other patients. Critical care was necessary to treat or prevent imminent or life-threatening deterioration. Critical care was time spent personally by me on the following activities: development of treatment plan with patient and/or surrogate as well as nursing, discussions with consultants, evaluation of patient's response to treatment, examination of patient, obtaining history from patient or surrogate, ordering and performing treatments and interventions, ordering and review of laboratory studies, ordering and review of radiographic studies, pulse oximetry and re-evaluation of patient's condition.   Final Clinical Impressions(s) / ED Diagnoses   Final diagnoses:  None    ED Discharge Orders    None       Veryl Speak, MD 06/01/18 [phone]

## 2018-06-01 NOTE — Progress Notes (Addendum)
PROGRESS NOTE    [name] [name]  RXV:[id number] DOB: 1930-03-14 DOA: 06/01/2018 PCP: [name], MD    Brief Narrative:  [name] [name] is a 84 y.o. female with medical history significant for chronic atrial fibrillation on Coumadin, anxiety, insulin-dependent diabetes mellitus, and COPD, now presenting to the emergency department with rectal bleeding and syncope.  Patient reports that she was in her usual state of health when she felt as though she might have diarrhea.  She reports passing bright red blood only.  She had a second episode shortly after this, became acutely lightheaded upon standing up from the commode, and had a brief loss of consciousness, reportedly falling onto a couch without any significant injury and recovering quickly.  She denies any abdominal pain, chest pain, or headache.  No vomiting.  ED Course: Upon arrival to the ED, patient is found to be afebrile, saturating well on room air, and with vitals otherwise stable.  Chemistry panel is unremarkable and CBC is notable for leukocytosis to 19,400 and a hemoglobin of 5.2, down from 14.3 earlier this month.  INR is 2.74.  Gastroenterology was consulted by the ED physician and recommended blood transfusion and reversal of INR only if there is further bleeding.  2 units of packed red blood cells were ordered for immediate transfusion, patient remains hemodynamically stable, has not had any further bleeding since arrival, and will be admitted for ongoing evaluation and management.    Assessment & Plan:   Principal Problem:   Symptomatic anemia Active Problems:   Leukocytosis   Atrial fibrillation    COPD (chronic obstructive pulmonary disease) (HCC)   DM neuropathy, type II diabetes mellitus (HCC)   Rectal bleeding   Acute GI bleed;  Presents with hematochezia.  On coumadin INR at 2.7--2.5. Early today per GI no INR reversal unless re-bleed.  Called this afternoon by nurse, that patient started to bleed,  profuse, multiples clot on bed.  CCM at bedside. Patient to received 2 units of PRBC, K centra for coumadin reversal.  She had an episode of syncope in the ICU and her HR twice drop to the 30.Marland Kitchen  Repeat cbc, further blood transfusion as needed.  Hb on admission at 5, after 2 units --13. Unclear if accurate. Repeated hb at 14  GI contacted and informed of change in patient conditions.   2-syncope;  In setting of GI bleed.  Blood transfusion.  IV fluids.   Chronic A fib;  Holding coumadin   COPD; continue with ICS/LABA    DM  SSI.   Anxiety; ativan PRN.   Leukocytosis;  Monitor trending down.  ua with positive nitrates. Follow urine culture.    DVT prophylaxis: scd Code Status: full code.  Family Communication: care discussed with husband.  Disposition Plan: remain in the ICU.   Consultants:   GI  CCM   Procedures:   none  Antimicrobials: none  Subjective: Seen this am, she was alert, no distress. Do bleeding  since last night at 10 pm.   Called by nurse this afternoon, that patient started to have Bright red blood per rectum.  Seen at bedside, patient is pale. BP 110 range. CCM at bedside. Patient to get Blood transfusion. Also femoral line.   Objective: Vitals:   06/01/18 0615 06/01/18 0645 06/01/18 0700 06/01/18 0730  BP: (!) 145/75 (!) 154/74 (!) 149/66 (!) 153/78  Pulse: 79 80 85 89  Resp: 19 18 (!) 26 17  Temp:      TempSrc:  SpO2: 95% 96% 96% 97%  Weight:      Height:        Intake/Output Summary (Last 24 hours) at 06/01/2018 0812 Last data filed at 06/01/2018 0356 Gross per 24 hour  Intake 1260 ml  Output -  Net 1260 ml   Filed Weights   06/01/18 0047  Weight: 63.5 kg    Examination:  General exam: Appears calm and comfortable  Respiratory system: Clear to auscultation. Respiratory effort normal. Cardiovascular system: S1 & S2 heard, RRR. No JVD, murmurs, rubs, gallops or clicks. No pedal edema. Gastrointestinal system: Abdomen  is nondistended, soft and nontender. No organomegaly or masses felt. Normal bowel sounds heard. Central nervous system: Alert and oriented. No focal neurological deficits. Extremities: Symmetric 5 x 5 power. Skin: No rashes, lesions or ulcers   Data Reviewed: I have personally reviewed following labs and imaging studies  CBC: Recent Labs  Lab 06/01/18 0051 06/01/18 0614  WBC 19.4* 13.0*  NEUTROABS 13.7* 8.6*  HGB 5.2* 13.6  HCT 16.3* 41.7  MCV 91.6 86.7  PLT 358 932   Basic Metabolic Panel: Recent Labs  Lab 06/01/18 0051  NA 136  K 3.7  CL 102  CO2 21*  GLUCOSE 196*  BUN 13  CREATININE 0.97  CALCIUM 8.9   GFR: Estimated Creatinine Clearance: 35.6 mL/min (by C-G formula based on SCr of 0.97 mg/dL). Liver Function Tests: Recent Labs  Lab 06/01/18 0051  AST 25  ALT 14  ALKPHOS 80  BILITOT 0.6  PROT 6.4*  ALBUMIN 3.3*   No results for input(s): LIPASE, AMYLASE in the last 168 hours. No results for input(s): AMMONIA in the last 168 hours. Coagulation Profile: Recent Labs  Lab 06/01/18 0051 06/01/18 0614  INR 2.74 2.58   Cardiac Enzymes: No results for input(s): CKTOTAL, CKMB, CKMBINDEX, TROPONINI in the last 168 hours. BNP (last 3 results) No results for input(s): PROBNP in the last 8760 hours. HbA1C: No results for input(s): HGBA1C in the last 72 hours. CBG: Recent Labs  Lab 06/01/18 0359  GLUCAP 175*   Lipid Profile: No results for input(s): CHOL, HDL, LDLCALC, TRIG, CHOLHDL, LDLDIRECT in the last 72 hours. Thyroid Function Tests: No results for input(s): TSH, T4TOTAL, FREET4, T3FREE, THYROIDAB in the last 72 hours. Anemia Panel: No results for input(s): VITAMINB12, FOLATE, FERRITIN, TIBC, IRON, RETICCTPCT in the last 72 hours. Sepsis Labs: No results for input(s): PROCALCITON, LATICACIDVEN in the last 168 hours.  No results found for this or any previous visit (from the past 240 hour(s)).       Radiology Studies: Dg Chest Port 1  View  Result Date: 06/01/2018 CLINICAL DATA:  84 y/o  F; syncope and weakness. EXAM: PORTABLE CHEST 1 VIEW COMPARISON:  01/09/2018 chest radiograph FINDINGS: Stable cardiac silhouette within normal limits given projection and technique. Dense mitral annular calcification. Aortic atherosclerosis with calcification. Clear lungs. No pleural effusion or pneumothorax. No acute osseous abnormality is evident. Right upper quadrant cholecystectomy clips. IMPRESSION: No active disease. Aortic Atherosclerosis (ICD10-I70.0). Electronically Signed   By: Kristine Garbe M.D.   On: 06/01/2018 04:28        Scheduled Meds: . sodium chloride   Intravenous Once  . insulin aspart  0-9 Units Subcutaneous Q4H  . latanoprost  1 drop Both Eyes QHS  . mometasone-formoterol  2 puff Inhalation BID  . sodium chloride flush  3 mL Intravenous Q12H  . timolol  1 drop Right Eye Daily   Continuous Infusions: . famotidine (PEPCID) IV  LOS: 0 days    Time spent: 35 minutes.     Elmarie Shiley, MD [facility] Pager [phone]  If 7PM-7AM, please contact night-coverage [email or web address] Password [facility] 06/01/2018, 8:12 AM    CCM will take over patient care. Please call [facility] when transfer out of ICU.  Appreciate Dr Valeta Harms help.

## 2018-06-01 NOTE — Consult Note (Addendum)
[facility] Consult: 11:04 AM 06/01/2018  LOS: 0 days    Referring Provider: Dr Tyrell Antonio  Primary Care Physician:  [name], MD Primary Gastroenterologist:  Dr. Carlean Purl    Reason for Consultation:  Anemia.     HPI: [name] [name] is a 84 y.o. female.  Hx A fib.  On Coumadin.  IDDM.  COPD.  Anxiety.  IBS.  Hyperplastic colon polyp.  Gastritis.  Diverticulosis.  Post ERCP pancreatitis.  C diff colitis 2014 and 2015.  Renal cyst. Traumatic right hip fx after fall, s/p left hemiarthroplasty 01/07/18.     01/2003 colonoscopy. Routine screening:  Diverticulosis.  Single tiny polyp (path hyperplastic) removed from sigmoid. 11/2011 ERCP with sphincterotomy for suspected stone:  Dr. Benson Norway noted dilated CBD, ? Filling defect but no material seen on sweeps, no clear evidence of retained stone.  Question retroperitoneal perforation? 12/2013 esophagram: Presbyesophagus with tertiary contractions which reproduce patient's symptoms.  No fixed esophageal obstruction, hiatal hernia or GER.  Tablet passage delayed but successful 04/2014 flexible sigmoidoscopy for abnormal CT and abdominal pain:   Sigmoid diverticulosis.  Recommended dicyclomine for IBS and possible adjustment of methylphenidate and other similar medications to treat malaise, fatigue. 06/02/2016 EGD for nausea and vomiting:  Tortuous esophagus, patchy antral gastritis with edema, erosions, erythema.  Otherwise normal study.  Pathology revealed reactive gastropathy with focal erosion and intestinal metaplasia.  Stained negative for H. pylori.  In addition to Coumadin, other relevant chronic meds in include Carafate, omeprazole.  No aspirin, no NSAIDs  Seen in the emergency department 8/12 with vertigo, dizziness,blurry vision.   CT and MR of the head showed  nothing acute.  There was mild for age, chronic microvascular ischemia with moderate loss of brain volume which had progressed mildly compared with 2005.   Baseline BM'S: soft, brown 2 x day.  Chronic intermittent postprandial nausea, hardly ever vomits.  This is stable in frequency occurring several times a month.  Occasional solid dysphagia. She has chronic malaise which is worse in the morning.  It improves by the afternoon at which point she and her husband can go out to eat.  Her appetite is poor.  She feels tired a lot. + balance disorder, uses a walker at home  Patient had brown stool earlier in the day.  At about 1030 last night, over the course of 30 minutes, she had continuous episodes of painless hematochezia.  These stopped but she was weak and had a syncopal spell with brief LOC. She managed to get to the back of an arm chair and fell forward on this.  She was very weak, her husband and some neighbors were able to assist her to the floor before EMS arrived.  No new nausea, vomiting.  No abdominal pain.  No recent GI bleeding.  No recent change in dose of Coumadin.  INR assays monthly, done last week.      Hgb 5.2 >> 13.6 >> 14.4 after 2 U PRBCs.  Baseline 14.3 on 05/16/18, 10.4 on 01/10/18.   WBCs 19,400.   BUN not elevated.  Past Medical History:  Diagnosis Date  . Atrial fibrillation (HCC)    RVR hx.  Chronic Coumadin  . C. difficile colitis 09/21/2013, 11/2013  . Carotid stenosis    a. Carotid US (10/2013):  bilat 1-39%; f/u 1 year  . Chronic lower back pain   . COPD (chronic obstructive pulmonary disease) ([place])   . Depression   . GERD (gastroesophageal reflux disease)    with HH  . Glaucoma   . HCAP (healthcare-associated pneumonia) 11/21/2011  . High cholesterol   . Hypertension   . Metabolic encephalopathy 12/11/7562  . Mitral regurgitation 03/11/2012  . Pancreatitis    ~2008, 11/2011 post ERCP  . Pneumonia 12/2011   "first time I know about"  . Sepsis ([place]) 11/27/2013  .  SIRS (systemic inflammatory response syndrome) ([place]) 03/13/2012   a/w post ERCP  pancreatitis.   . Type II diabetes mellitus ([place])     Past Surgical History:  Procedure Laterality Date  . APPENDECTOMY  ~ 1960  . CATARACT EXTRACTION W/ INTRAOCULAR LENS  IMPLANT, BILATERAL  2000's  . CHOLECYSTECTOMY  1990's   Lap chole  . COLONOSCOPY  01/2003   diverticulosis, 69m hyperplastic sigmoid polyp  . DILATION AND CURETTAGE OF UTERUS    . ERCP  11/13/2011   Procedure: ENDOSCOPIC RETROGRADE CHOLANGIOPANCREATOGRAPHY (ERCP);  Surgeon: PBeryle Beams MD;  [facility];  Service: Endoscopy;  Laterality: N/A;  . FLEXIBLE SIGMOIDOSCOPY N/A 05/02/2014   Procedure: FLEXIBLE SIGMOIDOSCOPY;  Surgeon: CGatha Mayer MD;  Location: [facility];  Service: Endoscopy;  Laterality: N/A;  . HIP ARTHROPLASTY Left 01/07/2018   Procedure: ARTHROPLASTY BIPOLAR HIP (HEMIARTHROPLASTY);  Surgeon: VHiram Gash MD;  [facility] [facility]  Service: Orthopedics;  Laterality: Left;  . TONSILLECTOMY AND ADENOIDECTOMY  ~ 1939  . VAGINAL HYSTERECTOMY  ~ 1960's    Prior to Admission medications   Medication Sig Start Date End Date Taking? Authorizing Provider  latanoprost (XALATAN) 0.005 % ophthalmic solution Place 1 drop into both eyes at bedtime.   Yes [provider]  Carboxymethylcellulose Sod PF (EQ RESTORE PLUS LUBRICANT EYE) 0.5 % SOLN Apply 1 drop to eye daily as needed.    [provider]  dicyclomine (BENTYL) 20 MG tablet TAKE 1/2 TABLET BY MOUTH EVERY 6 HOURS AS NEEDED FOR SPASMS(ABDOMINAL PAIN) 09/16/16   GGatha Mayer MD  diltiazem (DILT-XR) 120 MG 24 hr capsule TAKE 1 CAPSULE(120 MG) BY MOUTH DAILY 01/04/18   HMinus Breeding MD  divalproex (DEPAKOTE SPRINKLE) 125 MG capsule Take 125 mg by mouth 2 (two) times daily.    [provider]  gabapentin (NEURONTIN) 300 MG capsule Take 300 mg by mouth 3 (three) times daily.    [provider]  glimepiride (AMARYL) 4 MG tablet Take 4  mg by mouth daily with breakfast.     [provider]  HYDROcodone-acetaminophen (NORCO/VICODIN) 5-325 MG tablet Take 1 tablet by mouth every 6 (six) hours as needed for moderate pain.    [provider]  lactose free nutrition (BOOST) LIQD Take 237 mLs by mouth as needed.    [provider]  LANTUS SOLOSTAR 100 UNIT/ML Solostar Pen Inject 22 Units into the skin daily.  09/01/13   [provider]  loperamide (LOPERAMIDE A-D) 2 MG tablet Take 1 tablet (2 mg total) by mouth as needed for diarrhea or loose stools (use at bedtime). Patient taking differently: Take 2 mg by mouth at bedtime.  11/27/14   GGatha Mayer MD  LORazepam (  ATIVAN) 1 MG tablet Take 1 tablet (1 mg total) by mouth 3 (three) times daily. Patient taking differently: Take 1 mg by mouth every 8 (eight) hours as needed.  01/11/18   Mikhail, Velta Addison, DO  methylphenidate (RITALIN) 5 MG tablet Take 1 tablet (5 mg total) by mouth See admin instructions. take 24m in the morning daily and then 59mtwice a day on every other day Patient taking differently: Take 5 mg by mouth daily. take 51m50mn the morning daily and then 51mg33mice a day on every other day 11/01/17   EzenAlma Friendly  metoprolol tartrate (LOPRESSOR) 25 MG tablet TAKE 1 TABLET(25 MG) BY MOUTH TWICE DAILY WITH FOOD 12/13/17   HochMinus Breeding  mirtazapine (REMERON) 7.5 MG tablet TAKE 1 TABLET(7.5 MG) BY MOUTH AT BEDTIME 10/06/16   GessGatha Mayer  Multiple Vitamin (MULTIVITAMIN WITH MINERALS) TABS Take 1 tablet by mouth daily.    [provider]  omeprazole (PRILOSEC) 20 MG capsule Take 20 mg by mouth 2 (two) times daily.     [provider]  oxybutynin (DITROPAN) 5 MG tablet Take 1 tablet by mouth at bedtime. 07/29/16   [provider]  promethazine (PHENERGAN) 25 MG tablet Take 25 mg by mouth 3 (three) times daily as needed for nausea or vomiting.    [provider]  sucralfate (CARAFATE) 1 g tablet  Take 1 g by mouth 2 (two) times daily.    [provider]  SYMBICORT 160-4.5 MCG/ACT inhaler Inhale 2 puffs into the lungs 2 (two) times daily.  03/08/14   [provider]  timolol (TIMOPTIC) 0.5 % ophthalmic solution Place 1 drop into the right eye daily.  06/02/15   [provider]  warfarin (COUMADIN) 5 MG tablet Take 0.5-1 tablets (2.5-5 mg total) by mouth daily. Take 1.251mg32mry Sun/Wed then 2.5 mg the rest of the week 01/12/18   MikhaCristal Ford   Scheduled Meds: . insulin aspart  0-9 Units Subcutaneous Q4H  . latanoprost  1 drop Both Eyes QHS  . mouth rinse  15 mL Mouth Rinse BID  . mometasone-formoterol  2 puff Inhalation BID  . sodium chloride flush  3 mL Intravenous Q12H  . timolol  1 drop Right Eye Daily   Infusions: . famotidine (PEPCID) IV 20 mg (06/01/18 1031)   PRN Meds: acetaminophen **OR** acetaminophen, fentaNYL (SUBLIMAZE) injection, hydroxypropyl methylcellulose / hypromellose, LORazepam, ondansetron **OR** ondansetron (ZOFRAN) IV   Allergies as of 06/01/2018 - Review Complete 06/01/2018  Allergen Reaction Noted  . Lactose intolerance (gi) Nausea And Vomiting and Other (See Comments) 11/13/2011  . Penicillins Hives and Itching 11/12/2011  . Sulfa antibiotics Hives and Itching 11/12/2011  . Ciprofloxacin Swelling 11/23/2013  . Sulfasalazine Hives and Itching 08/22/2014    Family History  Problem Relation Age of Onset  . Stroke Father 90  .31troke Mother 85  .78nesthesia problems Neg Hx   . Hypotension Neg Hx   . Malignant hyperthermia Neg Hx   . Pseudochol deficiency Neg Hx   . Stomach cancer Neg Hx   . Esophageal cancer Neg Hx     Social History   Socioeconomic History  . Marital status: Married    Spouse name: [name]  . Number of children: 0  . Years of education: Not on file  . Highest education level: Not on file  Occupational History  . Not on file  Social Needs  . Financial resource strain: Not on file  .  Food  insecurity:    Worry: Not on file    Inability: Not on file  . Transportation needs:    Medical: Not on file    Non-medical: Not on file  Tobacco Use  . Smoking status: Never Smoker  . Smokeless tobacco: Never Used  Substance and Sexual Activity  . Alcohol use: No  . Drug use: No  . Sexual activity: Not Currently  Lifestyle  . Physical activity:    Days per week: Not on file    Minutes per session: Not on file  . Stress: Not on file  Relationships  . Social connections:    Talks on phone: Not on file    Gets together: Not on file    Attends religious service: Not on file    Active member of club or organization: Not on file    Attends meetings of clubs or organizations: Not on file    Relationship status: Not on file  . Intimate partner violence:    Fear of current or ex partner: Not on file    Emotionally abused: Not on file    Physically abused: Not on file    Forced sexual activity: Not on file  Other Topics Concern  . Not on file  Social History Narrative   Lives at home with her husband.   No children    REVIEW OF SYSTEMS: Constitutional: Generally feels poorly, especially in the morning.  Nonspecific malaise. ENT:  No nose bleeds Pulm: No shortness of breath, no cough. CV:  No palpitations, no LE edema.  No chest pain. GU:  No hematuria.  Urge incontinence.  No dysuria. GI: Per HPI Heme: No unusual bleeding or bruising. Transfusions: No prior transfusions. Neuro: Brief syncope as per HPI.  No headaches, no peripheral tingling or numbness Derm:  No itching, no rash or sores.  Endocrine: Sugars averaging around 130.  No sweats or chills.  No polyuria or dysuria Immunization: Not queried. Travel:  None beyond local counties in last few months.    PHYSICAL EXAM: Vital signs in last 24 hours: Vitals:   06/01/18 0900 06/01/18 1000  BP: 121/67 133/69  Pulse: 88 84  Resp: 13 14  Temp:    SpO2: 95% 96%   Wt Readings from Last 3 Encounters:  06/01/18 62 kg   05/16/18 61.2 kg  04/21/18 61.2 kg    General: Aged, moderately ill, tired appearing WF.  Easily awakened from her rest. Head: No facial asymmetry or swelling.  No signs of head trauma. Eyes: No conjunctival pallor.  No scleral icterus.  EOMI. Ears: Not hard of hearing Nose: No discharge or congestion Mouth: Oropharynx pink, moist, clear.  Tongue midline.  Good dentition. Neck: No JVD, no masses, no thyromegaly Lungs: Clear bilaterally.  No labored breathing or cough. Heart: Irregularly irregular, rate controlled.  S1, S2 present.  No MRG. Abdomen: Soft, obese, not tender or distended.  No HSM, masses, bruits, hernias..   Rectal: Nonbleeding external hemorrhoids which are slightly erythematous but not thrombosed.  No rectal masses.  Scant amount of medium to dark or red blood on exam glove. Musc/Skeltl: No joint redness, swelling or significant deformity.  No hematomas. Extremities: No CCE. Neurologic: Alert.  Oriented x3.  No tremor, no limb weakness. Skin: No rash, no sores, no abrasions. Nodes: No cervical adenopathy. Psych: Paucity of speech.  Flat affect.  Intake/Output from previous day: 08/27 0701 - 08/28 0700 In: 1260 [Blood:1260] Out: -  Intake/Output this shift: No intake/output data  recorded.  LAB RESULTS: Recent Labs    06/01/18 0051 06/01/18 0614 06/01/18 0820  WBC 19.4* 13.0* 12.4*  HGB 5.2* 13.6 14.4  HCT 16.3* 41.7 43.1  PLT 358 205 186   BMET Lab Results  Component Value Date   NA 136 06/01/2018   NA 138 05/16/2018   NA 140 01/11/2018   K 3.7 06/01/2018   K 4.7 05/16/2018   K 3.3 (L) 01/11/2018   CL 102 06/01/2018   CL 102 05/16/2018   CL 109 01/11/2018   CO2 21 (L) 06/01/2018   CO2 23 05/16/2018   CO2 22 01/11/2018   GLUCOSE 196 (H) 06/01/2018   GLUCOSE 125 (H) 05/16/2018   GLUCOSE 143 (H) 01/11/2018   BUN 13 06/01/2018   BUN 8 05/16/2018   BUN 13 01/11/2018   CREATININE 0.97 06/01/2018   CREATININE 0.90 05/16/2018   CREATININE 0.80  01/11/2018   CALCIUM 8.9 06/01/2018   CALCIUM 9.6 05/16/2018   CALCIUM 8.3 (L) 01/11/2018   LFT Recent Labs    06/01/18 0051  PROT 6.4*  ALBUMIN 3.3*  AST 25  ALT 14  ALKPHOS 80  BILITOT 0.6   PT/INR Lab Results  Component Value Date   INR 2.58 06/01/2018   INR 2.74 06/01/2018   INR 2.49 05/16/2018   Hepatitis Panel No results for input(s): HEPBSAG, HCVAB, HEPAIGM, HEPBIGM in the last 72 hours. C-Diff No components found for: CDIFF Lipase     Component Value Date/Time   LIPASE 29 05/16/2018 1635     RADIOLOGY STUDIES: Dg Chest Port 1 View  Result Date: 06/01/2018 CLINICAL DATA:  84 y/o  F; syncope and weakness. EXAM: PORTABLE CHEST 1 VIEW COMPARISON:  01/09/2018 chest radiograph FINDINGS: Stable cardiac silhouette within normal limits given projection and technique. Dense mitral annular calcification. Aortic atherosclerosis with calcification. Clear lungs. No pleural effusion or pneumothorax. No acute osseous abnormality is evident. Right upper quadrant cholecystectomy clips. IMPRESSION: No active disease. Aortic Atherosclerosis (ICD10-I70.0). Electronically Signed   By: Kristine Garbe M.D.   On: 06/01/2018 04:28     IMPRESSION:   *   Painless hematochezia.  Suspect diverticular bleed.  Duration of ~30 minutes last night.  Has not recurred.  *   Acute anemia.  She's had twice verified supra response to 2 U PRBCs with Hgb increase of 8.4 to 9.2 gms so suspect the Hgb of 5.2 was spurious.    *    Chronic Coumadin for A. Fib.  Therapeutic INR 2.7 at admission.  Coumadin on hold and INR drifting down, 2.5 today  *    IBS.  History gastritis.  Dysphagia, presbyesophagus. Has fairly regular but intermittent occurrence of postprandial nausea vomiting.  On Carafate and BID PPI at home.  *   Left hemiarthroplasty after fall, fx femur 01/07/18.     PLAN:     *   Stopped IV Pepcid, started Protonix 40 mg PO BID.  CBC in AM.    *   Since bleeding has not  recurred since ~ 11PM last night, 13 hours ago, no role for nuc med study.    *   Diet to full liquids.     Azucena Freed  06/01/2018, 11:04 AM Phone [phone]   Attending physician's note   I have taken an interval history, reviewed the chart and examined the patient. I agree with the Advanced Practitioner's note, impression, and recommendations. Since she was seen earlier by PA Gribbin, she has had recurrence of  BRBPR with mild lower abdominal cramping discomfort. She has remained HD stable, but with recurrence of overt GI blood loss. Discussed ddx to include hemorrhoids, diverticular bleed, AVM, ischemic colitis, etc. I additionally discussed further endoscopic evaluation for diagnostic and therapeutic intent with the patient and her husband at length and she would like to proceed with colonoscopy.   - Changed diet to clears - NPO at MN (except meds and prep) for colonoscopy in AM with MAC - Bowel prep ordered - Serial CBCs    [place], DO, FACG [phone] office

## 2018-06-01 NOTE — ED Triage Notes (Signed)
Patient arrived with EMS from home reports bloody stools this evening with generalized weakness/ fatigue and multiple near syncopal episodes with pallor and somnolence .

## 2018-06-01 NOTE — H&P (Addendum)
History and Physical    Despina Boan POE:[id number] DOB: 04-28-1930 DOA: 06/01/2018  PCP: [name], MD   Patient coming from: Home   Chief Complaint: Rectal bleeding, syncope   HPI: [name] [name] is a 84 y.o. female with medical history significant for chronic atrial fibrillation on Coumadin, anxiety, insulin-dependent diabetes mellitus, and COPD, now presenting to the emergency department with rectal bleeding and syncope.  Patient reports that she was in her usual state of health when she felt as though she might have diarrhea.  She reports passing bright red blood only.  She had a second episode shortly after this, became acutely lightheaded upon standing up from the commode, and had a brief loss of consciousness, reportedly falling onto a couch without any significant injury and recovering quickly.  She denies any abdominal pain, chest pain, or headache.  No vomiting.  ED Course: Upon arrival to the ED, patient is found to be afebrile, saturating well on room air, and with vitals otherwise stable.  Chemistry panel is unremarkable and CBC is notable for leukocytosis to 19,400 and a hemoglobin of 5.2, down from 14.3 earlier this month.  INR is 2.74.  Gastroenterology was consulted by the ED physician and recommended blood transfusion and reversal of INR only if there is further bleeding.  2 units of packed red blood cells were ordered for immediate transfusion, patient remains hemodynamically stable, has not had any further bleeding since arrival, and will be admitted for ongoing evaluation and management.  Review of Systems:  All other systems reviewed and apart from HPI, are negative.  Past Medical History:  Diagnosis Date  . Atrial fibrillation (HCC)    RVR hx.  Chronic Coumadin  . C. difficile colitis 09/21/2013, 11/2013  . Carotid stenosis    a. Carotid US (10/2013):  bilat 1-39%; f/u 1 year  . Chronic lower back pain   . COPD (chronic obstructive pulmonary disease)  ([place])   . Depression   . GERD (gastroesophageal reflux disease)    with HH  . Glaucoma   . HCAP (healthcare-associated pneumonia) 11/21/2011  . High cholesterol   . Hypertension   . Metabolic encephalopathy 12/03/5398  . Mitral regurgitation 03/11/2012  . Pancreatitis    ~2008, 11/2011 post ERCP  . Pneumonia 12/2011   "first time I know about"  . Sepsis ([place]) 11/27/2013  . SIRS (systemic inflammatory response syndrome) ([place]) 03/13/2012   a/w post ERCP  pancreatitis.   . Type II diabetes mellitus ([place])     Past Surgical History:  Procedure Laterality Date  . APPENDECTOMY  ~ 1960  . CATARACT EXTRACTION W/ INTRAOCULAR LENS  IMPLANT, BILATERAL  2000's  . CHOLECYSTECTOMY  1990's   Lap chole  . COLONOSCOPY  01/2003   diverticulosis, 43mm hyperplastic sigmoid polyp  . DILATION AND CURETTAGE OF UTERUS    . ERCP  11/13/2011   Procedure: ENDOSCOPIC RETROGRADE CHOLANGIOPANCREATOGRAPHY (ERCP);  Surgeon: Beryle Beams, MD;  Location: [facility] ENDOSCOPY;  Service: Endoscopy;  Laterality: N/A;  . FLEXIBLE SIGMOIDOSCOPY N/A 05/02/2014   Procedure: FLEXIBLE SIGMOIDOSCOPY;  Surgeon: Gatha Mayer, MD;  Location: [facility] ENDOSCOPY;  Service: Endoscopy;  Laterality: N/A;  . HIP ARTHROPLASTY Left 01/07/2018   Procedure: ARTHROPLASTY BIPOLAR HIP (HEMIARTHROPLASTY);  Surgeon: Hiram Gash, MD;  Location: [facility];  Service: Orthopedics;  Laterality: Left;  . TONSILLECTOMY AND ADENOIDECTOMY  ~ 1939  . VAGINAL HYSTERECTOMY  ~ 1960's     reports that she has never smoked. She has never used smokeless tobacco. She  reports that she does not drink alcohol or use drugs.  Allergies  Allergen Reactions  . Lactose Intolerance (Gi) Nausea And Vomiting and Other (See Comments)    severe stomach pain  . Penicillins Hives and Itching    "haven't used any since 1970's" Has patient had a PCN reaction causing immediate rash, facial/tongue/throat swelling, SOB or lightheadedness with hypotension: Yes Has patient had a PCN reaction  causing severe rash involving mucus membranes or skin necrosis: Unk Has patient had a PCN reaction that required hospitalization: Unk Has patient had a PCN reaction occurring within the last 10 years: No If all of the above answers are "NO", then may proceed with Cephalosporin use.   . Sulfa Antibiotics Hives and Itching    "haven't used any since 1970's"  . Ciprofloxacin Swelling    Site of swelling not recalled  . Sulfasalazine Hives and Itching    "haven't used any since 1970's"    Family History  Problem Relation Age of Onset  . Stroke Father 42  . Stroke Mother 22  . Anesthesia problems Neg Hx   . Hypotension Neg Hx   . Malignant hyperthermia Neg Hx   . Pseudochol deficiency Neg Hx   . Stomach cancer Neg Hx   . Esophageal cancer Neg Hx      Prior to Admission medications   Medication Sig Start Date End Date Taking? Authorizing Provider  latanoprost (XALATAN) 0.005 % ophthalmic solution Place 1 drop into both eyes at bedtime.   Yes [provider]  Carboxymethylcellulose Sod PF (EQ RESTORE PLUS LUBRICANT EYE) 0.5 % SOLN Apply 1 drop to eye daily as needed.    [provider]  dicyclomine (BENTYL) 20 MG tablet TAKE 1/2 TABLET BY MOUTH EVERY 6 HOURS AS NEEDED FOR SPASMS(ABDOMINAL PAIN) 09/16/16   Gatha Mayer, MD  diltiazem (DILT-XR) 120 MG 24 hr capsule TAKE 1 CAPSULE(120 MG) BY MOUTH DAILY 01/04/18   Minus Breeding, MD  divalproex (DEPAKOTE SPRINKLE) 125 MG capsule Take 125 mg by mouth 2 (two) times daily.    [provider]  gabapentin (NEURONTIN) 300 MG capsule Take 300 mg by mouth 3 (three) times daily.    [provider]  glimepiride (AMARYL) 4 MG tablet Take 4 mg by mouth daily with breakfast.     [provider]  HYDROcodone-acetaminophen (NORCO/VICODIN) 5-325 MG tablet Take 1 tablet by mouth every 6 (six) hours as needed for moderate pain.    [provider]  lactose free nutrition (BOOST) LIQD Take 237 mLs by  mouth as needed.    [provider]  LANTUS SOLOSTAR 100 UNIT/ML Solostar Pen Inject 22 Units into the skin daily.  09/01/13   [provider]  loperamide (LOPERAMIDE A-D) 2 MG tablet Take 1 tablet (2 mg total) by mouth as needed for diarrhea or loose stools (use at bedtime). Patient taking differently: Take 2 mg by mouth at bedtime.  11/27/14   Gatha Mayer, MD  LORazepam (ATIVAN) 1 MG tablet Take 1 tablet (1 mg total) by mouth 3 (three) times daily. Patient taking differently: Take 1 mg by mouth every 8 (eight) hours as needed.  01/11/18   Mikhail, Velta Addison, DO  methylphenidate (RITALIN) 5 MG tablet Take 1 tablet (5 mg total) by mouth See admin instructions. take 5mg  in the morning daily and then 5mg  twice a day on every other day Patient taking differently: Take 5 mg by mouth daily. take 5mg  in the morning daily and then 5mg  twice  a day on every other day 11/01/17   Alma Friendly, MD  metoprolol tartrate (LOPRESSOR) 25 MG tablet TAKE 1 TABLET(25 MG) BY MOUTH TWICE DAILY WITH FOOD 12/13/17   Minus Breeding, MD  mirtazapine (REMERON) 7.5 MG tablet TAKE 1 TABLET(7.5 MG) BY MOUTH AT BEDTIME 10/06/16   Gatha Mayer, MD  Multiple Vitamin (MULTIVITAMIN WITH MINERALS) TABS Take 1 tablet by mouth daily.    [provider]  omeprazole (PRILOSEC) 20 MG capsule Take 20 mg by mouth 2 (two) times daily.     [provider]  oxybutynin (DITROPAN) 5 MG tablet Take 1 tablet by mouth at bedtime. 07/29/16   [provider]  promethazine (PHENERGAN) 25 MG tablet Take 25 mg by mouth 3 (three) times daily as needed for nausea or vomiting.    [provider]  sucralfate (CARAFATE) 1 g tablet Take 1 g by mouth 2 (two) times daily.    [provider]  SYMBICORT 160-4.5 MCG/ACT inhaler Inhale 2 puffs into the lungs 2 (two) times daily.  03/08/14   [provider]  timolol (TIMOPTIC) 0.5 % ophthalmic solution Place 1 drop into the right eye daily.   06/02/15   [provider]  warfarin (COUMADIN) 5 MG tablet Take 0.5-1 tablets (2.5-5 mg total) by mouth daily. Take 1.25mg  every Sun/Wed then 2.5 mg the rest of the week 01/12/18   Cristal Ford, DO    Physical Exam: Vitals:   06/01/18 0300 06/01/18 0315 06/01/18 0330 06/01/18 0340  BP: 124/61 114/68 135/67 132/66  Pulse: 88 91 88 89  Resp: (!) 23 (!) 25 (!) 25 16  Temp:    98.2 F (36.8 C)  TempSrc:    Oral  SpO2: 98% 97% 98%   Weight:      Height:          Constitutional: NAD, calm  Eyes: PERTLA, lids and conjunctivae normal ENMT: Mucous membranes are moist. Posterior pharynx clear of any exudate or lesions.   Neck: normal, supple, no masses, no thyromegaly Respiratory: clear to auscultation bilaterally, no wheezing, no crackles. Normal respiratory effort.    Cardiovascular: Rate ~80 and irregular. No extremity edema.   Abdomen: No distension, no tenderness, soft. Bowel sounds active.  Musculoskeletal: no clubbing / cyanosis. No joint deformity upper and lower extremities.   Skin: no significant rashes, lesions, ulcers. Pale. Neurologic: No facial asymmetry. Sensation intact. Moving all extremities.  Psychiatric: Alert and oriented x 3. Normal mood and affect.     Labs on Admission: I have personally reviewed following labs and imaging studies  CBC: Recent Labs  Lab 06/01/18 0051  WBC 19.4*  NEUTROABS 13.7*  HGB 5.2*  HCT 16.3*  MCV 91.6  PLT 562   Basic Metabolic Panel: Recent Labs  Lab 06/01/18 0051  NA 136  K 3.7  CL 102  CO2 21*  GLUCOSE 196*  BUN 13  CREATININE 0.97  CALCIUM 8.9   GFR: Estimated Creatinine Clearance: 35.6 mL/min (by C-G formula based on SCr of 0.97 mg/dL). Liver Function Tests: Recent Labs  Lab 06/01/18 0051  AST 25  ALT 14  ALKPHOS 80  BILITOT 0.6  PROT 6.4*  ALBUMIN 3.3*   No results for input(s): LIPASE, AMYLASE in the last 168 hours. No results for input(s): AMMONIA in the last 168 hours. Coagulation  Profile: Recent Labs  Lab 06/01/18 0051  INR 2.74   Cardiac Enzymes: No results for input(s): CKTOTAL, CKMB, CKMBINDEX, TROPONINI in the last 168 hours. BNP (  last 3 results) No results for input(s): PROBNP in the last 8760 hours. HbA1C: No results for input(s): HGBA1C in the last 72 hours. CBG: No results for input(s): GLUCAP in the last 168 hours. Lipid Profile: No results for input(s): CHOL, HDL, LDLCALC, TRIG, CHOLHDL, LDLDIRECT in the last 72 hours. Thyroid Function Tests: No results for input(s): TSH, T4TOTAL, FREET4, T3FREE, THYROIDAB in the last 72 hours. Anemia Panel: No results for input(s): VITAMINB12, FOLATE, FERRITIN, TIBC, IRON, RETICCTPCT in the last 72 hours. Urine analysis:    Component Value Date/Time   COLORURINE YELLOW 01/06/2018 1924   APPEARANCEUR HAZY (A) 01/06/2018 1924   LABSPEC 1.008 01/06/2018 1924   PHURINE 7.0 01/06/2018 1924   GLUCOSEU NEGATIVE 01/06/2018 1924   HGBUR NEGATIVE 01/06/2018 1924   BILIRUBINUR NEGATIVE 01/06/2018 1924   KETONESUR NEGATIVE 01/06/2018 1924   PROTEINUR 100 (A) 01/06/2018 1924   UROBILINOGEN 0.2 11/23/2013 1431   NITRITE NEGATIVE 01/06/2018 1924   LEUKOCYTESUR MODERATE (A) 01/06/2018 1924   Sepsis Labs: @LABRCNTIP (procalcitonin:4,lacticidven:4) )No results found for this or any previous visit (from the past 240 hour(s)).   Radiological Exams on Admission: No results found.  EKG: Independently reviewed. Atrial fibrillation, rate 94.   Assessment/Plan   1. Rectal bleeding; symptomatic anemia  - Presents after two episodes of painless rectal bleeding with syncopal episode upon standing  - She has been hemodynamically stable in ED with no further bleeding  - Hgb is 5.2, down from 14.3 two weeks earlier  - She has known sigmoid diverticulosis and, based on hx, this is likely a diverticular bleed or possibly AVM  - She is on warfarin for a fib with INR 2.74  - Gastroenterology consulted by ED physician and much  appreciated, recommending transfusion and reversal of warfarin if there is any further bleeding  - 2 units RBC ordered for immediate transfusion, will check post-transfusion H&H    2. Syncope  - Patient became acutely lightheaded upon standing up from the commode where she had BRBPR and suffered a brief loss of awareness  - She denies chest pain or palpitations  - Likely orthostatic in setting of acute blood-loss  - Transfuse as above, continue cardiac monitoring, hold antihypertensives initially     3. Chronic atrial fibrillation  - CHADS-VASc is at least 64 (age x2, gender, DM)  - INR is therapeutic at 2.74 on admission - Warfarin held in light of GIB  - Per GI recommendations, reverse if she has any more bleeding    4. COPD  - No wheezing or SOB   - Continue ICS/LABA    5. Insulin-dependent DM  - A1c was 7.1% in April  - Managed at home with glimepiride and Lantus 22 units daily  - She is NPO on admission  - Check CBG's, use SSI with Novolog for now    6. Anxiety  - Continue Ativan as needed   7. Leukocytosis  - WBC is 19,400 on admission without fever  - Likely reactive, check CXR and UA, culture if febrile    DVT prophylaxis: SCD's, warfarin pta  Code Status: Full  Family Communication: Discussed with patient  Consults called: Gastroenterology  Admission status: Inpatient     Vianne Bulls, MD [facility] Pager [phone]  If 7PM-7AM, please contact night-coverage [email or web address] Password [facility]  06/01/2018, 3:47 AM

## 2018-06-01 NOTE — ED Notes (Signed)
1st unit PRBC infusing at 120 ml/hr , afebrile/VSS , respirations unlabored , no itching or SOB /IV sites intact .

## 2018-06-01 NOTE — ED Notes (Signed)
2nd unit PRBC infusing at 250 ml/hr , VSS/afebrile , respirations unlabored .denies pain / no adverse effect.

## 2018-06-01 NOTE — Anesthesia Preprocedure Evaluation (Addendum)
Anesthesia Evaluation  Patient identified by MRN, date of birth, ID band Patient awake    Reviewed: Allergy & Precautions, NPO status , Patient's Chart, lab work & pertinent test results  History of Anesthesia Complications Negative for: history of anesthetic complications  Airway Mallampati: II  TM Distance: >3 FB Neck ROM: Full    Dental  (+) Dental Advisory Given, Teeth Intact   Pulmonary COPD,    breath sounds clear to auscultation       Cardiovascular hypertension, Pt. on home beta blockers and Pt. on medications + Peripheral Vascular Disease  + dysrhythmias Atrial Fibrillation + Valvular Problems/Murmurs MR  Rhythm:Irregular Rate:Normal   '16 Carotid US - 1-39% b/l ICAS  '15 TTE - EF 55-60%, mild MR    Neuro/Psych Anxiety Depression negative neurological ROS     GI/Hepatic Neg liver ROS, GERD  Medicated and Controlled,  Endo/Other  diabetes, Type 2, Insulin Dependent  Renal/GU negative Renal ROS  negative genitourinary   Musculoskeletal negative musculoskeletal ROS (+)   Abdominal   Peds  Hematology negative hematology ROS (+)   Anesthesia Other Findings   Reproductive/Obstetrics                            Anesthesia Physical Anesthesia Plan  ASA: III  Anesthesia Plan: MAC   Post-op Pain Management:    Induction: Intravenous  PONV Risk Score and Plan: 2 and Propofol infusion and Treatment may vary due to age or medical condition  Airway Management Planned: Nasal Cannula and Natural Airway  Additional Equipment: None  Intra-op Plan:   Post-operative Plan:   Informed Consent: I have reviewed the patients History and Physical, chart, labs and discussed the procedure including the risks, benefits and alternatives for the proposed anesthesia with the patient or authorized representative who has indicated his/her understanding and acceptance.     Plan Discussed with:  CRNA and Anesthesiologist  Anesthesia Plan Comments:        Anesthesia Quick Evaluation

## 2018-06-02 ENCOUNTER — Ambulatory Visit: Payer: Medicare HMO | Admitting: Podiatry

## 2018-06-02 ENCOUNTER — Inpatient Hospital Stay (HOSPITAL_COMMUNITY): Payer: Medicare HMO | Admitting: Anesthesiology

## 2018-06-02 ENCOUNTER — Encounter (HOSPITAL_COMMUNITY): Payer: Self-pay | Admitting: *Deleted

## 2018-06-02 ENCOUNTER — Encounter (HOSPITAL_COMMUNITY)
Admission: EM | Disposition: A | Discharge status: Skilled Nursing Facility | Payer: Self-pay | Source: Home / Self Care | Attending: Internal Medicine

## 2018-06-02 DIAGNOSIS — I4891 Unspecified atrial fibrillation: Secondary | ICD-10-CM

## 2018-06-02 DIAGNOSIS — K5730 Diverticulosis of large intestine without perforation or abscess without bleeding: Secondary | ICD-10-CM

## 2018-06-02 DIAGNOSIS — R55 Syncope and collapse: Secondary | ICD-10-CM

## 2018-06-02 DIAGNOSIS — D689 Coagulation defect, unspecified: Secondary | ICD-10-CM

## 2018-06-02 DIAGNOSIS — J449 Chronic obstructive pulmonary disease, unspecified: Secondary | ICD-10-CM

## 2018-06-02 DIAGNOSIS — D120 Benign neoplasm of cecum: Secondary | ICD-10-CM

## 2018-06-02 DIAGNOSIS — K649 Unspecified hemorrhoids: Secondary | ICD-10-CM

## 2018-06-02 DIAGNOSIS — K625 Hemorrhage of anus and rectum: Secondary | ICD-10-CM

## 2018-06-02 HISTORY — PX: COLONOSCOPY WITH PROPOFOL: SHX5780

## 2018-06-02 LAB — CBC WITH DIFFERENTIAL/PLATELET
ABS IMMATURE GRANULOCYTES: 0.1 10*3/uL (ref 0.0–0.1)
ABS IMMATURE GRANULOCYTES: 0.2 10*3/uL — AB (ref 0.0–0.1)
Abs Immature Granulocytes: 0.1 10*3/uL (ref 0.0–0.1)
Abs Immature Granulocytes: 0.2 10*3/uL — ABNORMAL HIGH (ref 0.0–0.1)
BASOS ABS: 0.1 10*3/uL (ref 0.0–0.1)
BASOS PCT: 1 %
Basophils Absolute: 0.1 10*3/uL (ref 0.0–0.1)
Basophils Absolute: 0.1 10*3/uL (ref 0.0–0.1)
Basophils Absolute: 0.1 10*3/uL (ref 0.0–0.1)
Basophils Relative: 0 %
Basophils Relative: 1 %
Basophils Relative: 1 %
EOS ABS: 0.1 10*3/uL (ref 0.0–0.7)
EOS ABS: 0.1 10*3/uL (ref 0.0–0.7)
EOS ABS: 0.1 10*3/uL (ref 0.0–0.7)
EOS PCT: 1 %
EOS PCT: 1 %
Eosinophils Absolute: 0.1 10*3/uL (ref 0.0–0.7)
Eosinophils Relative: 1 %
Eosinophils Relative: 1 %
HCT: 37.1 % (ref 36.0–46.0)
HEMATOCRIT: 37.5 % (ref 36.0–46.0)
HEMATOCRIT: 39.3 % (ref 36.0–46.0)
HEMATOCRIT: 38 % (ref 36.0–46.0)
HEMOGLOBIN: 12.2 g/dL (ref 12.0–15.0)
Hemoglobin: 12.4 g/dL (ref 12.0–15.0)
Hemoglobin: 12.6 g/dL (ref 12.0–15.0)
Hemoglobin: 13.2 g/dL (ref 12.0–15.0)
IMMATURE GRANULOCYTES: 1 %
Immature Granulocytes: 1 %
Immature Granulocytes: 1 %
Immature Granulocytes: 1 %
LYMPHS ABS: 2.6 10*3/uL (ref 0.7–4.0)
LYMPHS ABS: 2.8 10*3/uL (ref 0.7–4.0)
LYMPHS ABS: 2.6 10*3/uL (ref 0.7–4.0)
LYMPHS PCT: 18 %
Lymphocytes Relative: 21 %
Lymphocytes Relative: 22 %
Lymphocytes Relative: 23 %
Lymphs Abs: 2.2 10*3/uL (ref 0.7–4.0)
MCH: 27.9 pg (ref 26.0–34.0)
MCH: 28.3 pg (ref 26.0–34.0)
MCH: 28.6 pg (ref 26.0–34.0)
MCH: 28.7 pg (ref 26.0–34.0)
MCHC: 32.6 g/dL (ref 30.0–36.0)
MCHC: 32.9 g/dL (ref 30.0–36.0)
MCHC: 33.6 g/dL (ref 30.0–36.0)
MCHC: 33.6 g/dL (ref 30.0–36.0)
MCV: 85.1 fL (ref 78.0–100.0)
MCV: 85.4 fL (ref 78.0–100.0)
MCV: 85.6 fL (ref 78.0–100.0)
MCV: 86.1 fL (ref 78.0–100.0)
MONO ABS: 0.6 10*3/uL (ref 0.1–1.0)
MONO ABS: 0.6 10*3/uL (ref 0.1–1.0)
MONO ABS: 0.8 10*3/uL (ref 0.1–1.0)
MONOS PCT: 6 %
MONOS PCT: 6 %
MONOS PCT: 5 %
Monocytes Absolute: 0.7 10*3/uL (ref 0.1–1.0)
Monocytes Relative: 6 %
NEUTROS ABS: 8.9 10*3/uL — AB (ref 1.7–7.7)
NEUTROS PCT: 69 %
Neutro Abs: 8 10*3/uL — ABNORMAL HIGH (ref 1.7–7.7)
Neutro Abs: 8.1 10*3/uL — ABNORMAL HIGH (ref 1.7–7.7)
Neutro Abs: 9.5 10*3/uL — ABNORMAL HIGH (ref 1.7–7.7)
Neutrophils Relative %: 68 %
Neutrophils Relative %: 71 %
Neutrophils Relative %: 74 %
PLATELETS: 154 10*3/uL (ref 150–400)
Platelets: 153 10*3/uL (ref 150–400)
Platelets: 153 10*3/uL (ref 150–400)
Platelets: 158 10*3/uL (ref 150–400)
RBC: 4.39 MIL/uL (ref 3.87–5.11)
RBC: 4.44 MIL/uL (ref 3.87–5.11)
RBC: 4.31 MIL/uL (ref 3.87–5.11)
RBC: 4.62 MIL/uL (ref 3.87–5.11)
RDW: 16.1 % — ABNORMAL HIGH (ref 11.5–15.5)
RDW: 15.9 % — AB (ref 11.5–15.5)
RDW: 15.9 % — AB (ref 11.5–15.5)
RDW: 16.2 % — AB (ref 11.5–15.5)
WBC: 11.6 10*3/uL — ABNORMAL HIGH (ref 4.0–10.5)
WBC: 11.6 10*3/uL — ABNORMAL HIGH (ref 4.0–10.5)
WBC: 11.9 10*3/uL — ABNORMAL HIGH (ref 4.0–10.5)
WBC: 13.4 10*3/uL — AB (ref 4.0–10.5)

## 2018-06-02 LAB — BASIC METABOLIC PANEL
Anion gap: 10 (ref 5–15)
BUN: 8 mg/dL (ref 8–23)
CALCIUM: 7.4 mg/dL — AB (ref 8.9–10.3)
CO2: 21 mmol/L — ABNORMAL LOW (ref 22–32)
CREATININE: 0.72 mg/dL (ref 0.44–1.00)
Chloride: 112 mmol/L — ABNORMAL HIGH (ref 98–111)
GFR calc non Af Amer: >60 mL/min (ref >60)
Glucose, Bld: 121 mg/dL — ABNORMAL HIGH (ref 70–99)
Potassium: 3.1 mmol/L — ABNORMAL LOW (ref 3.5–5.1)
SODIUM: 143 mmol/L (ref 135–145)

## 2018-06-02 LAB — GLUCOSE, CAPILLARY
GLUCOSE-CAPILLARY: 94 mg/dL (ref 70–99)
GLUCOSE-CAPILLARY: 205 mg/dL — AB (ref 70–99)
Glucose-Capillary: 114 mg/dL — ABNORMAL HIGH (ref 70–99)
Glucose-Capillary: 110 mg/dL — ABNORMAL HIGH (ref 70–99)
Glucose-Capillary: 149 mg/dL — ABNORMAL HIGH (ref 70–99)
Glucose-Capillary: 92 mg/dL (ref 70–99)

## 2018-06-02 LAB — COMPREHENSIVE METABOLIC PANEL
ALT: 12 U/L (ref 0–44)
ANION GAP: 7 (ref 5–15)
AST: 18 U/L (ref 15–41)
Albumin: 2.6 g/dL — ABNORMAL LOW (ref 3.5–5.0)
Alkaline Phosphatase: 59 U/L (ref 38–126)
BILIRUBIN TOTAL: 1.1 mg/dL (ref 0.3–1.2)
BUN: 10 mg/dL (ref 8–23)
CHLORIDE: 112 mmol/L — AB (ref 98–111)
CO2: 21 mmol/L — ABNORMAL LOW (ref 22–32)
CREATININE: 0.83 mg/dL (ref 0.44–1.00)
Calcium: 7.9 mg/dL — ABNORMAL LOW (ref 8.9–10.3)
Glucose, Bld: 121 mg/dL — ABNORMAL HIGH (ref 70–99)
POTASSIUM: 3.4 mmol/L — AB (ref 3.5–5.1)
Sodium: 140 mmol/L (ref 135–145)
TOTAL PROTEIN: 5 g/dL — AB (ref 6.5–8.1)

## 2018-06-02 LAB — HEMOGLOBIN AND HEMATOCRIT, BLOOD
HEMATOCRIT: 38.7 % (ref 36.0–46.0)
Hemoglobin: 13 g/dL (ref 12.0–15.0)

## 2018-06-02 LAB — PROTIME-INR
INR: 1.19
INR: 1.26
PROTHROMBIN TIME: 15.7 s — AB (ref 11.4–15.2)
Prothrombin Time: 15 seconds (ref 11.4–15.2)

## 2018-06-02 LAB — MAGNESIUM: Magnesium: 1.5 mg/dL — ABNORMAL LOW (ref 1.7–2.4)

## 2018-06-02 SURGERY — COLONOSCOPY WITH PROPOFOL
Anesthesia: Monitor Anesthesia Care

## 2018-06-02 MED ORDER — FLEET ENEMA 7-19 GM/118ML RE ENEM
2.0000 | ENEMA | Freq: Once | RECTAL | Status: AC
  Administered 2018-06-02: 2 via RECTAL
  Filled 2018-06-02: qty 2

## 2018-06-02 MED ORDER — PANTOPRAZOLE SODIUM 40 MG PO TBEC
40.0000 mg | DELAYED_RELEASE_TABLET | Freq: Two times a day (BID) | ORAL | Status: DC
  Administered 2018-06-02 – 2018-06-09 (×15): 40 mg via ORAL
  Filled 2018-06-02 (×17): qty 1

## 2018-06-02 MED ORDER — MAGNESIUM SULFATE 4 GM/100ML IV SOLN
4.0000 g | Freq: Once | INTRAVENOUS | Status: AC
  Administered 2018-06-02: 4 g via INTRAVENOUS
  Filled 2018-06-02: qty 100

## 2018-06-02 MED ORDER — SODIUM CHLORIDE 0.9 % IV SOLN
2.0000 g | INTRAVENOUS | Status: DC
  Administered 2018-06-02: 2 g via INTRAVENOUS
  Filled 2018-06-02 (×2): qty 20

## 2018-06-02 MED ORDER — LIDOCAINE HCL (CARDIAC) PF 100 MG/5ML IV SOSY
PREFILLED_SYRINGE | INTRAVENOUS | Status: DC | PRN
  Administered 2018-06-02: 20 mg via INTRATRACHEAL

## 2018-06-02 MED ORDER — PROPOFOL 500 MG/50ML IV EMUL
INTRAVENOUS | Status: DC | PRN
  Administered 2018-06-02: 50 ug/kg/min via INTRAVENOUS

## 2018-06-02 MED ORDER — POTASSIUM CHLORIDE 10 MEQ/50ML IV SOLN
10.0000 meq | INTRAVENOUS | Status: AC
Start: 2018-06-02 — End: 2018-06-02
  Administered 2018-06-02 (×2): 10 meq via INTRAVENOUS
  Filled 2018-06-02 (×2): qty 50

## 2018-06-02 MED ORDER — POTASSIUM CHLORIDE CRYS ER 20 MEQ PO TBCR
40.0000 meq | EXTENDED_RELEASE_TABLET | Freq: Two times a day (BID) | ORAL | Status: AC
  Administered 2018-06-02 (×2): 40 meq via ORAL
  Filled 2018-06-02 (×2): qty 2

## 2018-06-02 MED ORDER — SODIUM CHLORIDE 0.9 % IV SOLN
INTRAVENOUS | Status: DC | PRN
  Administered 2018-06-02: 10:00:00 via INTRAVENOUS

## 2018-06-02 MED ORDER — PROPOFOL 10 MG/ML IV BOLUS
INTRAVENOUS | Status: DC | PRN
  Administered 2018-06-02 (×4): 20 mg via INTRAVENOUS

## 2018-06-02 SURGICAL SUPPLY — 22 items

## 2018-06-02 NOTE — Progress Notes (Signed)
Pt confused and not answering orientation questions appropriately. Pt is refusing PO bowel prep for procedure, which she was agreeable to yesterday. Pt is unable to remember why she is admitted at this time. Notified spouse [name] [name] and he stated that this is not uncommon behavior for her if she has been under stress. PO bowel prep exchanged for fleet enema via GI service, at this time will attempt to administer.  Will continue to monitor.

## 2018-06-02 NOTE — Transfer of Care (Signed)
Immediate Anesthesia Transfer of Care Note  Patient: [name] [name]  Procedure(s) Performed: COLONOSCOPY WITH PROPOFOL (N/A )  Patient Location: Endoscopy Unit  Anesthesia Type:MAC  Level of Consciousness: awake, alert  and oriented  Airway & Oxygen Therapy: Patient Spontanous Breathing and Patient connected to nasal cannula oxygen  Post-op Assessment: Report given to RN, Post -op Vital signs reviewed and stable and Patient moving all extremities X 4  Post vital signs: Reviewed and stable  Last Vitals:  Vitals Value Taken Time  BP    Temp    Pulse    Resp    SpO2      Last Pain:  Vitals:   06/02/18 1001  TempSrc: Oral  PainSc: 0-No pain      Patients Stated Pain Goal: 0 (98/26/41 5830)  Complications: No apparent anesthesia complications

## 2018-06-02 NOTE — Progress Notes (Addendum)
[name] [name]  PPJ:[id number] DOB: 19-Jul-1930 DOA: 06/01/2018 PCP: [name], MD    Reason for Consult / Chief Complaint:  Acute GI bleed, hemorrhagic shock  Consulting MD:  Dr. Tyrell Antonio, 06/01/18  HPI/Brief Narrative   84 yo female with hx of chronic afib on coumadin, anxiety,TIIDM, and COPD admitted 8/28 for a GI bleed after presenting to the ED with syncope, witnessed rectal bleeding & a Hb of 5.2. She was fluid resuscitated, Hb repleted to 14. She was not reversed on her coumadin. She was admitted to the floor and CCM was consulted for management for her hemorrhagic shock.  Subjective  Patient confused about where she is and why. Nursing states she was A&O yesterday. She refused to take bowel prep but may be agreeable to enemas. She does not remember her last BM, states she feels weak. Per husband she is often confused when she becomes stressed out.   Assessment & Plan:   Acute hemorrhagic shock secondary to GI bleed: s/p 4U 8/28, given Vit K & Kcentra yesterday. Hb 12.6, INR 1.26  - GI following, endoscopy today  - enema bowel prep as pt refused po prep overnight  - PPI prophylaxis - transfuse for Hb <7   Hypokalemia: repleting K  Atrial fibrillation: episodes of Afib w/HR into 150s. BP stable in 140s. Reversed coagulation with Vit K and Kcentra yesterday  - hold home dilt, metoprolol, coumadin  COPD: on 2L [place]  TIIDM:   - SSI   Best Practice / Goals of Care / Disposition.   DVT prophylaxis: SCDs GI prophylaxis: Protonix 80mg  infusion 25cc/hr Diet: NPO  Mobility: In bed ICU Code Status: Full Family Communication: husband returning this am  Disposition / Summary of Today's Plan 06/02/18   Give patient enema bowel prep for colonoscopy to be done.   Consultants:  GI: 06/01/18  Procedures: 8/28 CVC right femoral >>  Significant Diagnostic Tests:   Micro Data: none  Antimicrobials:  none   Objective    Examination: General: lying supine  in bed, confused, somnolent HEENT: [place]/AT, [place] in place  Lungs: CTAB, no w/r/r Cardiovascular: afib, tachycardic, no m/r/g Abdomen: +bs, NTTP, non-distended, soft Extremities: no pitting edema, +pedal pulses, warm extremities, weak Neuro: not oriented to time, place, only self, disagreeable, CN intact and symmetric but difficult to obtain  Blood pressure (!) 167/94, pulse 96, temperature 98.1 F (36.7 C), temperature source Oral, resp. rate 15, height 5' (1.524 m), weight 61.9 kg, SpO2 98 %. . sodium chloride 100 mL/hr at 06/02/18 0600  . pantoprozole (PROTONIX) infusion 8 mg/hr (06/02/18 0600)  . potassium chloride 10 mEq (06/02/18 0615)      FiO2 (%):  [2 %] 2 %   Intake/Output Summary (Last 24 hours) at 06/02/2018 0628 Last data filed at 06/02/2018 0600 Gross per 24 hour  Intake 2721.48 ml  Output 200 ml  Net 2521.48 ml   Filed Weights   06/01/18 0047 06/01/18 0816 06/02/18 0416  Weight: 63.5 kg 62 kg 61.9 kg     Labs   CBC: Recent Labs  Lab 06/01/18 0051 06/01/18 0614 06/01/18 0820 06/01/18 1719 06/01/18 2010 06/01/18 2357 06/02/18 0157 06/02/18 0333  WBC 19.4* 13.0* 12.4* 14.5* 13.7* 13.4*  --  11.6*  NEUTROABS 13.7* 8.6*  --   --  10.4* 9.5*  --  8.1*  HGB 5.2* 13.6 14.4 14.6 13.5 13.2 13.0 12.6  HCT 16.3* 41.7 43.1 44.1 41.0 39.3 38.7 37.5  MCV 91.6 86.7 85.0 86.1 85.8 85.1  --  85.4  PLT 358 205 186 190 167 158  --  916   Basic Metabolic Panel: Recent Labs  Lab 06/01/18 0051 06/02/18 0333  NA 136 140  K 3.7 3.4*  CL 102 112*  CO2 21* 21*  GLUCOSE 196* 121*  BUN 13 10  CREATININE 0.97 0.83  CALCIUM 8.9 7.9*   GFR: Estimated Creatinine Clearance: 40 mL/min (by C-G formula based on SCr of 0.83 mg/dL). Recent Labs  Lab 06/01/18 1719 06/01/18 2010 06/01/18 2357 06/02/18 0333  WBC 14.5* 13.7* 13.4* 11.6*   Liver Function Tests: Recent Labs  Lab 06/01/18 0051 06/02/18 0333  AST 25 18  ALT 14 12  ALKPHOS 80 59  BILITOT 0.6 1.1  PROT 6.4*  5.0*  ALBUMIN 3.3* 2.6*   No results for input(s): LIPASE, AMYLASE in the last 168 hours. No results for input(s): AMMONIA in the last 168 hours. ABG No results found for: PHART, PCO2ART, PO2ART, HCO3, TCO2, ACIDBASEDEF, O2SAT  Coagulation Profile: Recent Labs  Lab 06/01/18 0051 06/01/18 0614 06/01/18 1826 06/01/18 2357 06/02/18 0333  INR 2.74 2.58 1.23 1.19 1.26   Cardiac Enzymes: No results for input(s): CKTOTAL, CKMB, CKMBINDEX, TROPONINI in the last 168 hours. HbA1C: Hgb A1c MFr Bld  Date/Time Value Ref Range Status  01/07/2018 11:23 AM 7.1 (H) 4.8 - 5.6 % Final    Comment:    (NOTE) Pre diabetes:          5.7%-6.4% Diabetes:              >6.4% Glycemic control for   <7.0% adults with diabetes   03/11/2014 02:57 AM 7.1 (H) <5.7 % Final    Comment:    (NOTE)                                                                       According to the ADA Clinical Practice Recommendations for 2011, when HbA1c is used as a screening test:  >=6.5%   Diagnostic of Diabetes Mellitus           (if abnormal result is confirmed) 5.7-6.4%   Increased risk of developing Diabetes Mellitus References:Diagnosis and Classification of Diabetes Mellitus,Diabetes BWGY,6599,35(TSVXB 1):S62-S69 and Standards of Medical Care in         Diabetes - 2011,Diabetes LTJQ,3009,23 (Suppl 1):S11-S61.   CBG: Recent Labs  Lab 06/01/18 1223 06/01/18 1511 06/01/18 1927 06/01/18 2352 06/02/18 0336  GLUCAP 121* 123* 132* 103* 114*    Past medical history   Past Medical History:  Diagnosis Date  . Atrial fibrillation (HCC)    RVR hx.  Chronic Coumadin  . C. difficile colitis 09/21/2013, 11/2013  . Carotid stenosis    a. Carotid US (10/2013):  bilat 1-39%; f/u 1 year  . Chronic lower back pain   . COPD (chronic obstructive pulmonary disease) ([place])   . Depression   . GERD (gastroesophageal reflux disease)    with HH  . Glaucoma   . HCAP (healthcare-associated pneumonia) 11/21/2011  . High  cholesterol   . Hypertension   . Metabolic encephalopathy 3/0/0762  . Mitral regurgitation 03/11/2012  . Pancreatitis    ~2008, 11/2011 post ERCP  . Pneumonia 12/2011   "first time I know about"  . Sepsis ([place]) 11/27/2013  .  SIRS (systemic inflammatory response syndrome) ([place]) 03/13/2012   a/w post ERCP  pancreatitis.   . Type II diabetes mellitus ([place])    01/2003 colonoscopy. Routine screening:  Diverticulosis.  Single tiny polyp (path hyperplastic) removed from sigmoid. 11/2011 ERCP with sphincterotomy for suspected stone:  Dr. Benson Norway noted dilated CBD, ? Filling defect but no material seen on sweeps, no clear evidence of retained stone.  Question retroperitoneal perforation? 12/2013 esophagram: Presbyesophagus with tertiary contractions which reproduce patient's symptoms.  No fixed esophageal obstruction, hiatal hernia or GER.  Tablet passage delayed but successful 04/2014 flexible sigmoidoscopy for abnormal CT and abdominal pain:   Sigmoid diverticulosis.  Recommended dicyclomine for IBS and possible adjustment of methylphenidate and other similar medications to treat malaise, fatigue. 06/02/2016 EGD for nausea and vomiting:  Tortuous esophagus, patchy antral gastritis with edema, erosions, erythema.  Otherwise normal study.  Pathology revealed reactive gastropathy with focal erosion and intestinal metaplasia.  Stained negative for H. pylori.  Social History   Social History   Socioeconomic History  . Marital status: Married    Spouse name: [name]  . Number of children: 0  . Years of education: Not on file  . Highest education level: Not on file  Occupational History  . Not on file  Social Needs  . Financial resource strain: Not on file  . Food insecurity:    Worry: Not on file    Inability: Not on file  . Transportation needs:    Medical: Not on file    Non-medical: Not on file  Tobacco Use  . Smoking status: Never Smoker  . Smokeless tobacco: Never Used  Substance and Sexual Activity    . Alcohol use: No  . Drug use: No  . Sexual activity: Not Currently  Lifestyle  . Physical activity:    Days per week: Not on file    Minutes per session: Not on file  . Stress: Not on file  Relationships  . Social connections:    Talks on phone: Not on file    Gets together: Not on file    Attends religious service: Not on file    Active member of club or organization: Not on file    Attends meetings of clubs or organizations: Not on file    Relationship status: Not on file  . Intimate partner violence:    Fear of current or ex partner: Not on file    Emotionally abused: Not on file    Physically abused: Not on file    Forced sexual activity: Not on file  Other Topics Concern  . Not on file  Social History Narrative   Lives at home with her husband.   No children    Family history    Family History  Problem Relation Age of Onset  . Stroke Father 27  . Stroke Mother 59  . Anesthesia problems Neg Hx   . Hypotension Neg Hx   . Malignant hyperthermia Neg Hx   . Pseudochol deficiency Neg Hx   . Stomach cancer Neg Hx   . Esophageal cancer Neg Hx     Allergies Allergies  Allergen Reactions  . Lactose Intolerance (Gi) Nausea And Vomiting and Other (See Comments)    severe stomach pain  . Penicillins Hives and Itching    "haven't used any since 1970's" Has patient had a PCN reaction causing immediate rash, facial/tongue/throat swelling, SOB or lightheadedness with hypotension: Yes Has patient had a PCN reaction causing severe rash involving mucus membranes or  skin necrosis: Unk Has patient had a PCN reaction that required hospitalization: Unk Has patient had a PCN reaction occurring within the last 10 years: No If all of the above answers are "NO", then may proceed with Cephalosporin use.   . Sulfa Antibiotics Hives and Itching    "haven't used any since 1970's"  . Ciprofloxacin Swelling    Site of swelling not recalled  . Sulfasalazine Hives and Itching     "haven't used any since 1970's"    Home meds  Prior to Admission medications   Medication Sig Start Date End Date Taking? Authorizing Provider  diltiazem (DILT-XR) 120 MG 24 hr capsule TAKE 1 CAPSULE(120 MG) BY MOUTH DAILY Patient taking differently: Take 120 mg by mouth daily. TAKE 1 CAPSULE(120 MG) BY MOUTH DAILY 01/04/18  Yes Minus Breeding, MD  gabapentin (NEURONTIN) 300 MG capsule Take 300 mg by mouth daily.    Yes [provider]  glimepiride (AMARYL) 4 MG tablet Take 4 mg by mouth daily with breakfast.    Yes [provider]  HYDROcodone-acetaminophen (NORCO/VICODIN) 5-325 MG tablet Take 1 tablet by mouth every 6 (six) hours as needed for moderate pain.   Yes [provider]  LANTUS SOLOSTAR 100 UNIT/ML Solostar Pen Inject 22 Units into the skin daily.  09/01/13  Yes [provider]  latanoprost (XALATAN) 0.005 % ophthalmic solution Place 1 drop into both eyes at bedtime.   Yes [provider]  LORazepam (ATIVAN) 1 MG tablet Take 1 tablet (1 mg total) by mouth 3 (three) times daily. Patient taking differently: Take 1 mg by mouth every 8 (eight) hours as needed.  01/11/18  Yes Mikhail, [place], DO  methylphenidate (RITALIN) 5 MG tablet Take 1 tablet (5 mg total) by mouth See admin instructions. take 5mg  in the morning daily and then 5mg  twice a day on every other day Patient taking differently: Take 5 mg by mouth daily. take 5mg  in the morning daily and then 5mg  twice a day on every other day 11/01/17  Yes Alma Friendly, MD  metoprolol tartrate (LOPRESSOR) 25 MG tablet TAKE 1 TABLET(25 MG) BY MOUTH TWICE DAILY WITH FOOD Patient taking differently: Take 25 mg by mouth 2 (two) times daily.  12/13/17  Yes Minus Breeding, MD  mirtazapine (REMERON) 7.5 MG tablet TAKE 1 TABLET(7.5 MG) BY MOUTH AT BEDTIME Patient taking differently: Take 7.5 mg by mouth at bedtime.  10/06/16  Yes Gatha Mayer, MD  omeprazole (PRILOSEC) 20 MG capsule Take 20 mg by  mouth 2 (two) times daily.    Yes [provider]  oxybutynin (DITROPAN) 5 MG tablet Take 1 tablet by mouth at bedtime. 07/29/16  Yes [provider]  SYMBICORT 160-4.5 MCG/ACT inhaler Inhale 2 puffs into the lungs 2 (two) times daily.  03/08/14  Yes [provider]  warfarin (COUMADIN) 5 MG tablet Take 0.5-1 tablets (2.5-5 mg total) by mouth daily. Take 1.25mg  every Sun/Wed then 2.5 mg the rest of the week Patient taking differently: Take 2.5-5 mg by mouth daily.  01/12/18  Yes Mikhail, Clinical biochemist, DO  Carboxymethylcellulose Sod PF (EQ RESTORE PLUS LUBRICANT EYE) 0.5 % SOLN Apply 1 drop to eye daily as needed.    [provider]  divalproex (DEPAKOTE SPRINKLE) 125 MG capsule Take 125 mg by mouth 2 (two) times daily.    [provider]  lactose free nutrition (BOOST) LIQD Take 237 mLs by mouth as needed.    [provider]  loperamide (LOPERAMIDE A-D) 2  MG tablet Take 1 tablet (2 mg total) by mouth as needed for diarrhea or loose stools (use at bedtime). Patient taking differently: Take 2 mg by mouth at bedtime.  11/27/14   Gatha Mayer, MD  Multiple Vitamin (MULTIVITAMIN WITH MINERALS) TABS Take 1 tablet by mouth daily.    [provider]  promethazine (PHENERGAN) 25 MG tablet Take 25 mg by mouth 3 (three) times daily as needed for nausea or vomiting.    [provider]  sucralfate (CARAFATE) 1 g tablet Take 1 g by mouth 2 (two) times daily.    [provider]     LOS: 1 day

## 2018-06-02 NOTE — Anesthesia Procedure Notes (Signed)
Procedure Name: MAC Date/Time: 06/02/2018 10:34 AM Performed by: Neldon Newport, CRNA Pre-anesthesia Checklist: Timeout performed, Suction available, Patient being monitored, Emergency Drugs available and Patient identified Patient Re-evaluated:Patient Re-evaluated prior to induction Oxygen Delivery Method: Nasal cannula

## 2018-06-02 NOTE — Op Note (Signed)
[facility] Patient Name: [name] [name] Procedure Date : 06/02/2018 MRN: [id number] Attending MD: Gerrit Heck , MD Date of Birth: Nov 06, 1929 CSN: [id number] Age: 84 Admit Type: Inpatient Procedure:                Colonoscopy Indications:              Hematochezia, Acute post hemorrhagic anemia Providers:                Gerrit Heck, MD, Kingsley Plan, RN, Elspeth Cho Tech., Technician, Neldon Newport                            CRNA, CRNA Referring MD:              Medicines:                Monitored Anesthesia Care Complications:            No immediate complications. Estimated Blood Loss:     Estimated blood loss: none. Procedure:                Pre-Anesthesia Assessment:                           - Prior to the procedure, a History and Physical                            was performed, and patient medications and                            allergies were reviewed. The patient's tolerance of                            previous anesthesia was also reviewed. The risks                            and benefits of the procedure and the sedation                            options and risks were discussed with the patient.                            All questions were answered, and informed consent                            was obtained. Prior Anticoagulants: The patient has                            taken Coumadin (warfarin), last dose was 2 days                            prior to procedure. ASA Grade Assessment: III - A  patient with severe systemic disease. After                            reviewing the risks and benefits, the patient was                            deemed in satisfactory condition to undergo the                            procedure.                           After obtaining informed consent, the colonoscope                            was passed under direct vision. Throughout the                             procedure, the patient's blood pressure, pulse, and                            oxygen saturations were monitored continuously. The                            CF-HQ190L ([id number]) Olympus adult colon was                            introduced through the anus and advanced to the the                            terminal ileum. The colonoscopy was performed                            without difficulty. The patient tolerated the                            procedure well. The quality of the bowel                            preparation was adequate. Scope In: 10:37:25 AM Scope Out: 10:55:10 AM Scope Withdrawal Time: 0 hours 8 minutes 44 seconds  Total Procedure Duration: 0 hours 17 minutes 45 seconds  Findings:      Hemorrhoids were found on perianal exam.      Multiple small and large-mouthed diverticula were found in the sigmoid       colon and distal descending colon. Lavage of the area was performed       using copious amounts of sterile water, resulting in clearance with good       visualization. There was no active bleeding nor high grade stigmata of       bleeding noted on this study.      Non-bleeding internal hemorrhoids were found during retroflexion. The       hemorrhoids were small.      A 2 mm polyp was found in the cecum. The polyp was sessile. This was not  resected during this procedure.      The exam was otherwise without abnormality. There was no blood noted       throughout the colon. No active bleeding or high grade stigmata of       bleeding noted.      The terminal ileum appeared normal. Impression:               - Hemorrhoids found on perianal exam.                           - Diverticulosis in the sigmoid colon and in the                            distal descending colon.                           - Non-bleeding internal hemorrhoids.                           - One 2 mm polyp in the cecum.                           - The examination  was otherwise normal.                           - The examined portion of the ileum was normal.                           - No specimens collected.                           - Clinical presentation most consistent with                            self-limiting acute diverticular bleed. Recommendation:           - Return patient to ICU for ongoing care.                           - Full liquid diet today.                           - Continue present medications.                           - Repeat colonoscopy PRN.                           - If rebleeding, recommend either tagged RBC scan                            vs CT Angiography for suspected diverticular bleed. Procedure Code(s):        --- Professional ---                           [id number], Colonoscopy, flexible; diagnostic, including  collection of specimen(s) by brushing or washing,                            when performed (separate procedure) Diagnosis Code(s):        --- Professional ---                           K64.8, Other hemorrhoids                           D12.0, Benign neoplasm of cecum                           K92.1, Melena (includes Hematochezia)                           D62, Acute posthemorrhagic anemia                           K57.30, Diverticulosis of large intestine without                            perforation or abscess without bleeding CPT copyright 2017 American Medical Association. All rights reserved. The codes documented in this report are preliminary and upon coder review may  be revised to meet current compliance requirements. Gerrit Heck, MD 06/02/2018 11:22:47 AM Number of Addenda: 0

## 2018-06-02 NOTE — Consult Note (Signed)
   [facility] CM Inpatient Consult   06/02/2018  Avyanna Spada 08/26/1930 [id number]   Patient was assessed for [place] Management for community services with [facility]. Patient was previously active with [place] Management with [place], [facility] Social Worker, and [place].  Patient is currently post procedure for GI Bleeding.  [facility] Written consent form signed and on file.   Will follow for progress and check for re-start at appropriate time.  Of note, [facility] Care Management services does not replace or interfere with any services that are arranged by inpatient case management or social work. For additional questions or referrals please contact:  [name], RN BSN [facility] Liaison  [phone] business mobile phone Toll free office [phone]

## 2018-06-02 NOTE — Progress Notes (Signed)
Patient refused bowel prep. Explained to patient the purpose of this. Patient states, "If the doctor would have wanted me to take this, he would have told me."

## 2018-06-02 NOTE — Progress Notes (Signed)
[facility] ADULT ICU REPLACEMENT PROTOCOL FOR AM LAB REPLACEMENT ONLY  The patient does apply for the [facility] Adult ICU Electrolyte Replacment Protocol based on the criteria listed below:   1. Is GFR >/= 40 ml/min? Yes.    Patient's GFR today is >60 2. Is urine output >/= 0.5 ml/kg/hr for the last 6 hours? Yes.   Patient's UOP is 0.5 ml/kg/hr 3. Is BUN < 60 mg/dL? Yes.    Patient's BUN today is 10 4. Abnormal electrolyte(s): K3.4 5. Ordered repletion with: per protocol 6. If a panic level lab has been reported, has the CCM MD in charge been notified? Yes.  .   Physician:  Bethann Humble MD  Vear Clock 06/02/2018 6:05 AM

## 2018-06-02 NOTE — Anesthesia Postprocedure Evaluation (Signed)
Anesthesia Post Note  Patient: [name] [name]  Procedure(s) Performed: COLONOSCOPY WITH PROPOFOL (N/A )     Patient location during evaluation: PACU Anesthesia Type: MAC Level of consciousness: awake and alert Pain management: pain level controlled Vital Signs Assessment: post-procedure vital signs reviewed and stable Respiratory status: spontaneous breathing, nonlabored ventilation and respiratory function stable Cardiovascular status: stable and blood pressure returned to baseline Anesthetic complications: no    Last Vitals:  Vitals:   06/02/18 1103 06/02/18 1108  BP:  (!) 131/108  Pulse: 77 85  Resp:  20  Temp:    SpO2: 99% 100%    Last Pain:  Vitals:   06/02/18 1108  TempSrc:   PainSc: 0-No pain                 Audry Pili

## 2018-06-03 ENCOUNTER — Encounter (HOSPITAL_COMMUNITY): Payer: Self-pay | Admitting: Gastroenterology

## 2018-06-03 LAB — URINE CULTURE

## 2018-06-03 LAB — TYPE AND SCREEN
ABO/RH(D): A POS
Antibody Screen: NEGATIVE
UNIT DIVISION: 0
Unit division: 0
Unit division: 0
Unit division: 0
Unit division: 0
Unit division: 0

## 2018-06-03 LAB — BPAM RBC
BLOOD PRODUCT EXPIRATION DATE: 201909072359
BLOOD PRODUCT EXPIRATION DATE: 201909072359
Blood Product Expiration Date: 201909032359
Blood Product Expiration Date: 201909032359
Blood Product Expiration Date: 201909072359
Blood Product Expiration Date: 201909072359
ISSUE DATE / TIME: 201908280205
ISSUE DATE / TIME: 201908280348
ISSUE DATE / TIME: 201908281616
ISSUE DATE / TIME: 201908281616
UNIT TYPE AND RH: 6200
UNIT TYPE AND RH: 6200
UNIT TYPE AND RH: 6200
UNIT TYPE AND RH: 6200
Unit Type and Rh: 6200
Unit Type and Rh: 6200

## 2018-06-03 LAB — BASIC METABOLIC PANEL
Anion gap: 5 (ref 5–15)
BUN: 7 mg/dL — ABNORMAL LOW (ref 8–23)
CALCIUM: 8.2 mg/dL — AB (ref 8.9–10.3)
CO2: 22 mmol/L (ref 22–32)
CREATININE: 0.74 mg/dL (ref 0.44–1.00)
Chloride: 112 mmol/L — ABNORMAL HIGH (ref 98–111)
GFR calc Af Amer: >60 mL/min (ref >60)
GLUCOSE: 136 mg/dL — AB (ref 70–99)
Potassium: 3.8 mmol/L (ref 3.5–5.1)
Sodium: 139 mmol/L (ref 135–145)

## 2018-06-03 LAB — PROTIME-INR
INR: 1.16
PROTHROMBIN TIME: 14.7 s (ref 11.4–15.2)

## 2018-06-03 LAB — GLUCOSE, CAPILLARY
GLUCOSE-CAPILLARY: 116 mg/dL — AB (ref 70–99)
Glucose-Capillary: 114 mg/dL — ABNORMAL HIGH (ref 70–99)
Glucose-Capillary: 121 mg/dL — ABNORMAL HIGH (ref 70–99)
Glucose-Capillary: 109 mg/dL — ABNORMAL HIGH (ref 70–99)
Glucose-Capillary: 138 mg/dL — ABNORMAL HIGH (ref 70–99)

## 2018-06-03 MED ORDER — DILTIAZEM HCL ER COATED BEADS 120 MG PO CP24
120.0000 mg | ORAL_CAPSULE | Freq: Every day | ORAL | Status: DC
  Administered 2018-06-03 – 2018-06-09 (×7): 120 mg via ORAL
  Filled 2018-06-03 (×7): qty 1

## 2018-06-03 MED ORDER — SODIUM CHLORIDE 0.9 % IV SOLN
1.0000 g | INTRAVENOUS | Status: DC
  Administered 2018-06-03: 1 g via INTRAVENOUS
  Filled 2018-06-03: qty 10

## 2018-06-03 MED ORDER — WARFARIN SODIUM 2.5 MG PO TABS
2.5000 mg | ORAL_TABLET | Freq: Once | ORAL | Status: AC
  Administered 2018-06-03: 2.5 mg via ORAL
  Filled 2018-06-03: qty 1

## 2018-06-03 MED ORDER — WARFARIN - PHARMACIST DOSING INPATIENT
Freq: Every day | Status: DC
  Administered 2018-06-04: 1
  Administered 2018-06-07: 17:00:00

## 2018-06-03 MED ORDER — CEPHALEXIN 500 MG PO CAPS
500.0000 mg | ORAL_CAPSULE | Freq: Two times a day (BID) | ORAL | Status: AC
  Administered 2018-06-03 – 2018-06-06 (×6): 500 mg via ORAL
  Filled 2018-06-03 (×7): qty 1

## 2018-06-03 NOTE — Progress Notes (Signed)
CSW notes new consult for SNF. [employer] will be closed until next Tuesday, so patient will likely not be able to be placed until Wednesday.   Percell Locus Reni Hausner LCSW [phone]

## 2018-06-03 NOTE — Progress Notes (Signed)
PROGRESS NOTE [facility]   [name] [name]   VZC:[id number] DOB: [birth year]  DOA: 06/01/2018 PCP: [name], MD   Brief Narrative:  [name] [name] the 84-year-old female with medical history of chronic A. fib on Coumadin, anxiety, COPD and type 2 diabetes mellitus who presented to the emergency department complaining of an passing out.  Upon ED evaluation she was found to be pale, elevated white count at 19 K, hemoglobin of 5.2 down from 14.3 earlier this month.  An INR of 2.7.  Patient admitted with working diagnosis of GI bleed.  Patient was transfused 2 units of PRBCs and subsequently hemoglobin was up to 13, however patient became hemodynamically stable, she was given Kcentra an vitamin K, and 2 more units of PRBCs were ordered.  Patient was transferred to ICU for further management.  Patient underwent colonoscopy which showed diverticular disease, nonbleeding hemorrhoids and 2 mm polyp.   Subjective: Patient seen and examined, her hemoglobin has remained stable, she reported feeling weak.  Denies dizziness, chest pain and shortness of breath.  No further GI bleed.  No acute events overnight.  Assessment & Plan: Acute GI bleed in setting of warfarin Felt to be secondary to diverticular/hemorrhoidal bleed.  Status post colonoscopy 8/29. Patient received a total of 4 units of PRBC during hospital stay.  Unclear if initial hemoglobin of 5 was accurate.  However patient remains with hemoglobin of 12 and steady.  No signs of overt bleeding.  Continue to monitor hemoglobin.  Case discussed with GI, okay to resume warfarin tonight.  Will monitor hemoglobin in a.m. if remains stable DC to SNF.  Syncope/hypovolemic shock Due to GI bleed, patient was resuscitated with IV fluids.  Blood pressure improved.  DC IV fluids as patient is tolerating oral diet.  A. fib Heart rate well controlled, initially anticoagulation was reversed with vitamin K and Kcentra.  Diltiazem was on hold,  will resume as blood pressure improved.  Holding metoprolol resume in a.m. if BP stable.  Per GI okay to resume warfarin tonight.  Check INR in a.m.  COPD Stable, no wheezings.  Patient was on 2 L nasal cannula after anesthesia.  Successfully weaned off.  Home inhalers.  UTI Urine culture grew E. coli pansensitive. Initially treated with ceftriaxone, will switch to Keflex.  Deconditioning PT recommending SNF  Type 2 diabetes mellitus CBGs stable, continue SSI  DVT prophylaxis: SCDs, starting warfarin tonight Code Status: Full code Family Communication: Case discussed with husband Disposition Plan: Transferred to MedSurg and if hemoglobin remains stable SNF in a.m.   Consultants:   GI, PCCM  Procedures:   EGD/colonoscopy  Antimicrobials: Anti-infectives (From admission, onward)   Start     Dose/Rate Route Frequency Ordered Stop   06/03/18 1600  cephALEXin (KEFLEX) capsule 500 mg     500 mg Oral Every 12 hours 06/03/18 1433     06/03/18 1100  cefTRIAXone (ROCEPHIN) 1 g in sodium chloride 0.9 % 100 mL IVPB  Status:  Discontinued     1 g 200 mL/hr over 30 Minutes Intravenous Every 24 hours 06/03/18 0755 06/03/18 1433   06/02/18 1100  cefTRIAXone (ROCEPHIN) 2 g in sodium chloride 0.9 % 100 mL IVPB  Status:  Discontinued     2 g 200 mL/hr over 30 Minutes Intravenous Every 24 hours 06/02/18 1019 06/03/18 0755        Objective: Vitals:   06/03/18 0900 06/03/18 1000 06/03/18 1039 06/03/18 1404  BP:   (!) 155/79 (!) 149/92  Pulse: (!) 105  95 93 (!) 104  Resp: 17 19 18 20   Temp:   97.6 F (36.4 C) 98.6 F (37 C)  TempSrc:   Oral Oral  SpO2: 96% 97% 97% 97%  Weight:      Height:        Intake/Output Summary (Last 24 hours) at 06/03/2018 1441 Last data filed at 06/03/2018 1000 Gross per 24 hour  Intake 1729.02 ml  Output 2300 ml  Net -570.98 ml   Filed Weights   06/01/18 0816 06/02/18 0416 06/03/18 0500  Weight: 62 kg 61.9 kg 61.6 kg    Examination:  General  exam: NAD  HEENT: OP clear  Respiratory system: Clear to auscultation. No wheezes,crackle or rhonchi Cardiovascular system: S1 & S2 heard, RRR. No JVD, murmurs, rubs or gallops Gastrointestinal system: Abdomen is nondistended, soft and nontender. Central nervous system: Alert and oriented. No focal neurological deficits. Extremities: No pedal edema. Skin: No rashes Psychiatry:  Mood & affect appropriate.    Data Reviewed: I have personally reviewed following labs and imaging studies  CBC: Recent Labs  Lab 06/01/18 2010 06/01/18 2357 06/02/18 0157 06/02/18 0333 06/02/18 0838 06/02/18 1310  WBC 13.7* 13.4*  --  11.6* 11.6* 11.9*  NEUTROABS 10.4* 9.5*  --  8.1* 8.0* 8.9*  HGB 13.5 13.2 13.0 12.6 12.4 12.2  HCT 41.0 39.3 38.7 37.5 38.0 37.1  MCV 85.8 85.1  --  85.4 85.6 86.1  PLT 167 158  --  154 153 024   Basic Metabolic Panel: Recent Labs  Lab 06/01/18 0051 06/02/18 0333 06/02/18 1310 06/02/18 1312 06/03/18 0520  NA 136 140  --  143 139  K 3.7 3.4*  --  3.1* 3.8  CL 102 112*  --  112* 112*  CO2 21* 21*  --  21* 22  GLUCOSE 196* 121*  --  121* 136*  BUN 13 10  --  8 7*  CREATININE 0.97 0.83  --  0.72 0.74  CALCIUM 8.9 7.9*  --  7.4* 8.2*  MG  --   --  1.5*  --   --    GFR: Estimated Creatinine Clearance: 41.4 mL/min (by C-G formula based on SCr of 0.74 mg/dL). Liver Function Tests: Recent Labs  Lab 06/01/18 0051 06/02/18 0333  AST 25 18  ALT 14 12  ALKPHOS 80 59  BILITOT 0.6 1.1  PROT 6.4* 5.0*  ALBUMIN 3.3* 2.6*   No results for input(s): LIPASE, AMYLASE in the last 168 hours. No results for input(s): AMMONIA in the last 168 hours. Coagulation Profile: Recent Labs  Lab 06/01/18 0614 06/01/18 1826 06/01/18 2357 06/02/18 0333 06/03/18 0520  INR 2.58 1.23 1.19 1.26 1.16   Cardiac Enzymes: No results for input(s): CKTOTAL, CKMB, CKMBINDEX, TROPONINI in the last 168 hours. BNP (last 3 results) No results for input(s): PROBNP in the last 8760  hours. HbA1C: No results for input(s): HGBA1C in the last 72 hours. CBG: Recent Labs  Lab 06/02/18 1921 06/02/18 2311 06/03/18 0355 06/03/18 0740 06/03/18 1116  GLUCAP 149* 92 114* 121* 109*   Lipid Profile: No results for input(s): CHOL, HDL, LDLCALC, TRIG, CHOLHDL, LDLDIRECT in the last 72 hours. Thyroid Function Tests: No results for input(s): TSH, T4TOTAL, FREET4, T3FREE, THYROIDAB in the last 72 hours. Anemia Panel: No results for input(s): VITAMINB12, FOLATE, FERRITIN, TIBC, IRON, RETICCTPCT in the last 72 hours. Sepsis Labs: No results for input(s): PROCALCITON, LATICACIDVEN in the last 168 hours.  Recent Results (from the past 240 hour(s))  Urine Culture  Status: Abnormal   Collection Time: 06/01/18  6:48 AM  Result Value Ref Range Status   Specimen Description URINE, RANDOM  Final   Special Requests   Final    NONE Performed at [facility], [address] [place], [place] [address]    Culture >=100,000 COLONIES/mL ESCHERICHIA COLI (A)  Final   Report Status 06/03/2018 FINAL  Final   Organism ID, Bacteria ESCHERICHIA COLI (A)  Final      Susceptibility   Escherichia coli - MIC*    AMPICILLIN <=2 SENSITIVE Sensitive     CEFAZOLIN <=4 SENSITIVE Sensitive     CEFTRIAXONE <=1 SENSITIVE Sensitive     CIPROFLOXACIN <=0.25 SENSITIVE Sensitive     GENTAMICIN <=1 SENSITIVE Sensitive     IMIPENEM <=0.25 SENSITIVE Sensitive     NITROFURANTOIN <=16 SENSITIVE Sensitive     TRIMETH/SULFA <=20 SENSITIVE Sensitive     AMPICILLIN/SULBACTAM <=2 SENSITIVE Sensitive     PIP/TAZO <=4 SENSITIVE Sensitive     Extended ESBL NEGATIVE Sensitive     * >=100,000 COLONIES/mL ESCHERICHIA COLI  MRSA PCR Screening     Status: None   Collection Time: 06/01/18  7:58 AM  Result Value Ref Range Status   MRSA by PCR NEGATIVE NEGATIVE Final    Comment:        The GeneXpert MRSA Assay (FDA approved for NASAL specimens only), is one component of a comprehensive MRSA  colonization surveillance program. It is not intended to diagnose MRSA infection nor to guide or monitor treatment for MRSA infections. Performed at [facility], [place] [address], [place], [place] [address]       Radiology Studies: No results found.    Scheduled Meds: . cephALEXin  500 mg Oral Q12H  . diltiazem  120 mg Oral Daily  . insulin aspart  0-9 Units Subcutaneous Q4H  . latanoprost  1 drop Both Eyes QHS  . mouth rinse  15 mL Mouth Rinse BID  . mometasone-formoterol  2 puff Inhalation BID  . pantoprazole  40 mg Oral BID  . sodium chloride flush  3 mL Intravenous Q12H  . timolol  1 drop Right Eye Daily  . warfarin  2.5 mg Oral ONCE-1800  . Warfarin - Pharmacist Dosing Inpatient   Does not apply q1800   Continuous Infusions:   LOS: 2 days   Time spent: Total of 25 minutes spent with pt, greater than 50% of which was spent in discussion of  treatment, counseling and coordination of care  Chipper Oman, MD Pager: Text Page via [email or web address]   If 7PM-7AM, please contact night-coverage [email or web address] 06/03/2018, 2:41 PM   Note - This record has been created using [employer]. Chart creation errors have been sought, but may not always have been located. Such creation errors do not reflect on the standard of medical care.

## 2018-06-03 NOTE — Evaluation (Signed)
Physical Therapy Evaluation Patient Details Name: [name] [name] MRN: [id number] DOB: June 04, 1930 Today's Date: 06/03/2018   History of Present Illness  84 yo female with hx of chronic afib on coumadin, anxiety,TIIDM, and COPD admitted 8/28 for a GI bleed after presenting to the ED with syncope, witnessed rectal bleeding & a Hb of 5.2. She was fluid resuscitated, Hb repleted to 14. Colonoscopy performed 06/02/18 Clinical presentation most consistent with self-limiting acute diverticular bleed.   Clinical Impression  PTA pt living with husband and able to ambulate limited household distances independently with Rollator, requires assist for bathing and dressing and husband performs iADLS. Pt currently limited in safe mobility by decreased awareness of safety with DME, decreased strength, balance and endurance. Pt requires min A for bed moblity, min guard for transfers and minA for ambulation of 30 feet with RW. PT recommending SNF level rehab at d/c to improve strength, and endurance for eventual safety in her home environment.  PT will continue to follow acutely.     Follow Up Recommendations SNF    Equipment Recommendations  None recommended by PT    Recommendations for Other Services       Precautions / Restrictions Precautions Precautions: Fall Precaution Comments: hx of hip fx Restrictions Weight Bearing Restrictions: No      Mobility  Bed Mobility Overal bed mobility: Needs Assistance Bed Mobility: Supine to Sit;Sit to Supine     Supine to sit: Supervision Sit to supine: Min assist   General bed mobility comments: supervision for supine to sit, increased time and effort with HoB elevated, minA for managing LE into bed  Transfers Overall transfer level: Needs assistance Equipment used: Rolling walker (2 wheeled) Transfers: Sit to/from Stand Sit to Stand: Min guard         General transfer comment: min guard for safety, good power up and steadying in  standing  Ambulation/Gait Ambulation/Gait assistance: Min assist Gait Distance (Feet): 30 Feet Assistive device: Rolling walker (2 wheeled) Gait Pattern/deviations: Step-through pattern;Decreased stride length;Decreased weight shift to left;Shuffle;Trunk flexed Gait velocity: slowed Gait velocity interpretation: <1.31 ft/sec, indicative of household ambulator General Gait Details: minA for steadying with RW, pt used to Rollator and required frequent vc for proximity to RW, mild instability throughout, no overt LoB      Balance Overall balance assessment: Needs assistance Sitting-balance support: No upper extremity supported;Feet supported Sitting balance-Leahy Scale: Good     Standing balance support: Bilateral upper extremity supported Standing balance-Leahy Scale: Poor Standing balance comment: requires RW                             Pertinent Vitals/Pain Pain Assessment: No/denies pain    Home Living Family/patient expects to be discharged to:: Private residence Living Arrangements: Spouse/significant other Available Help at Discharge: Family;Available 24 hours/day Type of Home: House Home Access: Ramped entrance     Home Layout: One level Home Equipment: Walker - 4 wheels;Shower seat;Toilet riser;Grab bars - tub/shower;Hand held shower head      Prior Function Level of Independence: Needs assistance   Gait / Transfers Assistance Needed: ambulates with Rollator  ADL's / Homemaking Assistance Needed: husband helps with showers and dressing, she and her husband eat out often and he does any cooking and cleaning        Hand Dominance        Extremity/Trunk Assessment   Upper Extremity Assessment Upper Extremity Assessment: Generalized weakness    Lower Extremity Assessment Lower  Extremity Assessment: LLE deficits/detail;Generalized weakness LLE Deficits / Details: L partial hip replacement, limited hip ROM, hip strength grossly assessed at 2+/5,  knee and ankle strength grossly 3/5 LLE Coordination: decreased fine motor;decreased gross motor       Communication   Communication: No difficulties  Cognition Arousal/Alertness: Lethargic Behavior During Therapy: Flat affect Overall Cognitive Status: Within Functional Limits for tasks assessed                                        General Comments General comments (skin integrity, edema, etc.): pt with baseline SoB throughout session, SaO2 >90%O2 on RA, husband present at end of session        Assessment/Plan    PT Assessment Patient needs continued PT services  PT Problem List Decreased range of motion;Decreased strength;Decreased activity tolerance;Decreased balance;Decreased mobility;Decreased cognition;Decreased knowledge of use of DME       PT Treatment Interventions DME instruction;Gait training;Functional mobility training;Therapeutic activities;Therapeutic exercise;Balance training;Cognitive remediation;Patient/family education    PT Goals (Current goals can be found in the Care Plan section)  Acute Rehab PT Goals Patient Stated Goal: go home PT Goal Formulation: With patient/family Time For Goal Achievement: 06/17/18 Potential to Achieve Goals: Fair    Frequency Min 2X/week    AM-PAC PT "6 Clicks" Daily Activity  Outcome Measure Difficulty turning over in bed (including adjusting bedclothes, sheets and blankets)?: A Little Difficulty moving from lying on back to sitting on the side of the bed? : A Little Difficulty sitting down on and standing up from a chair with arms (e.g., wheelchair, bedside commode, etc,.)?: Unable Help needed moving to and from a bed to chair (including a wheelchair)?: A Little Help needed walking in hospital room?: A Little Help needed climbing 3-5 steps with a railing? : A Lot 6 Click Score: 15    End of Session Equipment Utilized During Treatment: Gait belt Activity Tolerance: Patient limited by lethargy Patient  left: in bed;with call bell/phone within reach;with bed alarm set;with family/visitor present Nurse Communication: Mobility status PT Visit Diagnosis: Unsteadiness on feet (R26.81);Other abnormalities of gait and mobility (R26.89);Muscle weakness (generalized) (M62.81);History of falling (Z91.81);Difficulty in walking, not elsewhere classified (R26.2)    Time: 1205-1229 PT Time Calculation (min) (ACUTE ONLY): 24 min   Charges:   PT Evaluation $PT Eval Moderate Complexity: 1 Mod PT Treatments $Gait Training: 8-22 mins        Montrell Cessna B. Migdalia Dk PT, DPT Acute Rehabilitation Services Pager [phone] Office [phone]   [place] 06/03/2018, 12:48 PM

## 2018-06-03 NOTE — Progress Notes (Addendum)
ANTICOAGULATION CONSULT NOTE - Initial Consult  Pharmacy Consult for Warfarin Indication: atrial fibrillation  Allergies  Allergen Reactions  . Lactose Intolerance (Gi) Nausea And Vomiting and Other (See Comments)    severe stomach pain  . Penicillins Hives and Itching    "haven't used any since 1970's" Has patient had a PCN reaction causing immediate rash, facial/tongue/throat swelling, SOB or lightheadedness with hypotension: Yes Has patient had a PCN reaction causing severe rash involving mucus membranes or skin necrosis: Unk Has patient had a PCN reaction that required hospitalization: Unk Has patient had a PCN reaction occurring within the last 10 years: No If all of the above answers are "NO", then may proceed with Cephalosporin use.   . Sulfa Antibiotics Hives and Itching    "haven't used any since 1970's"  . Ciprofloxacin Swelling    Site of swelling not recalled  . Sulfasalazine Hives and Itching    "haven't used any since 1970's"    Patient Measurements: Height: 5' (152.4 cm) Weight: 135 lb 12.9 oz (61.6 kg) IBW/kg (Calculated) : 45.5  Vital Signs: Temp: 97.5 F (36.4 C) (08/30 0742) Temp Source: Oral (08/30 0742) BP: 155/76 (08/30 0800) Pulse Rate: 105 (08/30 0900)  Labs: Recent Labs    06/01/18 2357  06/02/18 0333 06/02/18 0838 06/02/18 1310 06/02/18 1312 06/03/18 0520  HGB 13.2   < > 12.6 12.4 12.2  --   --   HCT 39.3   < > 37.5 38.0 37.1  --   --   PLT 158  --  154 153 153  --   --   LABPROT 15.0  --  15.7*  --   --   --  14.7  INR 1.19  --  1.26  --   --   --  1.16  CREATININE  --   --  0.83  --   --  0.72 0.74   < > = values in this interval not displayed.    Estimated Creatinine Clearance: 41.4 mL/min (by C-G formula based on SCr of 0.74 mg/dL).   Medical History: Past Medical History:  Diagnosis Date  . Atrial fibrillation (HCC)    RVR hx.  Chronic Coumadin  . C. difficile colitis 09/21/2013, 11/2013  . Carotid stenosis    a. Carotid  US (10/2013):  bilat 1-39%; f/u 1 year  . Chronic lower back pain   . COPD (chronic obstructive pulmonary disease) ([place])   . Depression   . GERD (gastroesophageal reflux disease)    with HH  . Glaucoma   . HCAP (healthcare-associated pneumonia) 11/21/2011  . High cholesterol   . Hypertension   . Metabolic encephalopathy 01/03/6605  . Mitral regurgitation 03/11/2012  . Pancreatitis    ~2008, 11/2011 post ERCP  . Pneumonia 12/2011   "first time I know about"  . Sepsis ([place]) 11/27/2013  . SIRS (systemic inflammatory response syndrome) ([place]) 03/13/2012   a/w post ERCP  pancreatitis.   . Type II diabetes mellitus ([place])     Assessment: Patient is an 84 yo F presented with rectal bleeding and syncope. She was taking warfarin for Afib PTA ([facility] - age, female, DM) and presented with Hgb of 5.2 and therapeutic INR of 2.74. Recent INRs have been within goal of 2-3. Last warfarin dose 8/27. Pt went into hypovolemic shock and found to have very large bloody BM. Was given KCentra and VKA.  8/29 EDG shows no active bleeding. INR subtherapeutic at 1.16. CBC WNL. Will restart home dose  of warfarin as she has clinically improved and not actively bleeding. Will not bridge due to significant GI bleed this admission.  Goal of Therapy:  INR 2-3 Monitor platelets by anticoagulation protocol: Yes   Plan:  Initiate Warfarin 2.5 mg PO once daily Monitor for s/sxs bleeding  Richardine Service, PharmD Candidate 06/03/2018,9:52 AM

## 2018-06-04 LAB — GLUCOSE, CAPILLARY
GLUCOSE-CAPILLARY: 171 mg/dL — AB (ref 70–99)
Glucose-Capillary: 136 mg/dL — ABNORMAL HIGH (ref 70–99)
Glucose-Capillary: 144 mg/dL — ABNORMAL HIGH (ref 70–99)
Glucose-Capillary: 191 mg/dL — ABNORMAL HIGH (ref 70–99)
Glucose-Capillary: 86 mg/dL (ref 70–99)
Glucose-Capillary: 95 mg/dL (ref 70–99)

## 2018-06-04 LAB — CBC WITH DIFFERENTIAL/PLATELET
ABS IMMATURE GRANULOCYTES: 0.1 10*3/uL (ref 0.0–0.1)
Basophils Absolute: 0.1 10*3/uL (ref 0.0–0.1)
Basophils Relative: 1 %
Eosinophils Absolute: 0.2 10*3/uL (ref 0.0–0.7)
Eosinophils Relative: 2 %
HEMATOCRIT: 37.5 % (ref 36.0–46.0)
Hemoglobin: 12.6 g/dL (ref 12.0–15.0)
Immature Granulocytes: 1 %
Lymphocytes Relative: 26 %
Lymphs Abs: 3 10*3/uL (ref 0.7–4.0)
MCH: 29 pg (ref 26.0–34.0)
MCHC: 33.6 g/dL (ref 30.0–36.0)
MCV: 86.2 fL (ref 78.0–100.0)
MONO ABS: 0.6 10*3/uL (ref 0.1–1.0)
Monocytes Relative: 6 %
Neutro Abs: 7.4 10*3/uL (ref 1.7–7.7)
Neutrophils Relative %: 64 %
Platelets: 212 10*3/uL (ref 150–400)
RBC: 4.35 MIL/uL (ref 3.87–5.11)
RDW: 15.6 % — ABNORMAL HIGH (ref 11.5–15.5)
WBC: 11.4 10*3/uL — ABNORMAL HIGH (ref 4.0–10.5)

## 2018-06-04 LAB — BASIC METABOLIC PANEL
Anion gap: 11 (ref 5–15)
BUN: 7 mg/dL — ABNORMAL LOW (ref 8–23)
CO2: 19 mmol/L — AB (ref 22–32)
Calcium: 8.7 mg/dL — ABNORMAL LOW (ref 8.9–10.3)
Chloride: 109 mmol/L (ref 98–111)
Creatinine, Ser: 0.73 mg/dL (ref 0.44–1.00)
GFR calc Af Amer: >60 mL/min (ref >60)
GFR calc non Af Amer: >60 mL/min (ref >60)
Glucose, Bld: 133 mg/dL — ABNORMAL HIGH (ref 70–99)
POTASSIUM: 3.4 mmol/L — AB (ref 3.5–5.1)
Sodium: 139 mmol/L (ref 135–145)

## 2018-06-04 LAB — VITAMIN B12: VITAMIN B 12: 218 pg/mL (ref 180–914)

## 2018-06-04 LAB — TSH: TSH: 1.846 u[IU]/mL (ref 0.350–4.500)

## 2018-06-04 LAB — PROTIME-INR
INR: 1.12
PROTHROMBIN TIME: 14.3 s (ref 11.4–15.2)

## 2018-06-04 MED ORDER — WARFARIN SODIUM 2.5 MG PO TABS
2.5000 mg | ORAL_TABLET | Freq: Once | ORAL | Status: AC
  Administered 2018-06-04: 2.5 mg via ORAL
  Filled 2018-06-04: qty 1

## 2018-06-04 NOTE — Progress Notes (Signed)
PROGRESS NOTE    [name] [name]  GYK:[id number] DOB: 1930-05-01 DOA: 06/01/2018 PCP: [name], MD     Brief Narrative:  84 year old woman admitted from home on 8/28 after syncopal episode.  She has a past medical history that is significant for chronic atrial fibrillation on Coumadin, COPD type 2 diabetes.  On admission she was found to have a hemoglobin of 5.2 and an INR of 2.0, she also been complaining of bright red blood in her stools.  She was initially transfused 2 units of PRBCs, subsequent hemoglobin was up to 13 (question validity of initial hemoglobin of 5) she subsequently became hemodynamically unstable and was given Kcentra and vitamin K and additional 2 units of PRBCs were ordered.  She underwent colonoscopy that showed diverticular disease and nonbleeding hemorrhoids.  She has been evaluated by physical therapy and recommendation is for SNF.   Assessment & Plan:   Principal Problem:   Symptomatic anemia Active Problems:   Leukocytosis   Atrial fibrillation    Coagulopathy (HCC)   Acute GI bleeding   COPD (chronic obstructive pulmonary disease) (HCC)   DM neuropathy, type II diabetes mellitus ([place])   Rectal bleeding   Hematochezia   Shock ([place])   Diverticulosis of colon without hemorrhage   Hemorrhoids   Acute GI bleeding in the setting of warfarin/acute blood loss anemia -Felt to be secondary to a diverticular bleed. -She is status post colonoscopy on 8/29. -Has received a total of 4 units of PRBCs, her hemoglobin remained stable at 12.6.  States that she had some red blood in her stools overnight but hemoglobin remained stable.  Syncope/hypovolemic shock -Due to GI bleed, patient was resuscitated with fluids and blood products. -Shock physiology has resolved.  Atrial fibrillation -Rate is currently controlled, diltiazem has been resumed as of 8/30. -She was initially anticoagulated and was reversed with vitamin K and Kcentra given hemodynamic  instability and GI bleed. -Per GI, okay to resume warfarin, this is been restarted on 8/30.  UTI -Urine culture grew pansensitive E. coli -Was initially treated with Rocephin, will give an additional 3 days of Keflex and then discontinue.  Generalized weakness/deconditioning -Evaluated by physical therapy with recommendations for SNF -We will also check TSH and vitamin B12.   DVT prophylaxis: Coumadin, no bridging due to acute GI bleed Code Status: Full code Family Communication: Patient only Disposition Plan: SNF once bed available, per social work notes it appears that insurance authorization will not happen over holiday weekend  Consultants:   GI  Procedures:   Colonoscopy on 8/29  Antimicrobials:  Anti-infectives (From admission, onward)   Start     Dose/Rate Route Frequency Ordered Stop   06/03/18 1600  cephALEXin (KEFLEX) capsule 500 mg     500 mg Oral Every 12 hours 06/03/18 1433     06/03/18 1100  cefTRIAXone (ROCEPHIN) 1 g in sodium chloride 0.9 % 100 mL IVPB  Status:  Discontinued     1 g 200 mL/hr over 30 Minutes Intravenous Every 24 hours 06/03/18 0755 06/03/18 1433   06/02/18 1100  cefTRIAXone (ROCEPHIN) 2 g in sodium chloride 0.9 % 100 mL IVPB  Status:  Discontinued     2 g 200 mL/hr over 30 Minutes Intravenous Every 24 hours 06/02/18 1019 06/03/18 0755       Subjective: Lying in bed, feels weak but otherwise no acute issues.  Does state she had a scant amount of right red blood in her stools overnight.  Denies chest pain or shortness of  breath.  Objective: Vitals:   06/03/18 2105 06/03/18 2106 06/03/18 2155 06/04/18 0600  BP:   136/73 (!) 160/88  Pulse:  94 97 (!) 102  Resp:  20 16 (!) 21  Temp:   98 F (36.7 C) 97.6 F (36.4 C)  TempSrc:    Oral  SpO2: 97% 97% 96% 99%  Weight:      Height:        Intake/Output Summary (Last 24 hours) at 06/04/2018 0806 Last data filed at 06/03/2018 1000 Gross per 24 hour  Intake 129.13 ml  Output -  Net  129.13 ml   Filed Weights   06/01/18 0816 06/02/18 0416 06/03/18 0500  Weight: 62 kg 61.9 kg 61.6 kg    Examination:  General exam: Alert, awake, oriented x 3 Respiratory system: Clear to auscultation. Respiratory effort normal. Cardiovascular system:RRR. No murmurs, rubs, gallops. Gastrointestinal system: Abdomen is nondistended, soft and nontender. No organomegaly or masses felt. Normal bowel sounds heard. Central nervous system: Alert and oriented. No focal neurological deficits. Extremities: No C/C/E, +pedal pulses Skin: No rashes, lesions or ulcers Psychiatry: Judgement and insight appear normal. Mood & affect appropriate.     Data Reviewed: I have personally reviewed following labs and imaging studies  CBC: Recent Labs  Lab 06/01/18 2357 06/02/18 0157 06/02/18 0333 06/02/18 0838 06/02/18 1310 06/04/18 0631  WBC 13.4*  --  11.6* 11.6* 11.9* 11.4*  NEUTROABS 9.5*  --  8.1* 8.0* 8.9* 7.4  HGB 13.2 13.0 12.6 12.4 12.2 12.6  HCT 39.3 38.7 37.5 38.0 37.1 37.5  MCV 85.1  --  85.4 85.6 86.1 86.2  PLT 158  --  154 153 153 509   Basic Metabolic Panel: Recent Labs  Lab 06/01/18 0051 06/02/18 0333 06/02/18 1310 06/02/18 1312 06/03/18 0520  NA 136 140  --  143 139  K 3.7 3.4*  --  3.1* 3.8  CL 102 112*  --  112* 112*  CO2 21* 21*  --  21* 22  GLUCOSE 196* 121*  --  121* 136*  BUN 13 10  --  8 7*  CREATININE 0.97 0.83  --  0.72 0.74  CALCIUM 8.9 7.9*  --  7.4* 8.2*  MG  --   --  1.5*  --   --    GFR: Estimated Creatinine Clearance: 41.4 mL/min (by C-G formula based on SCr of 0.74 mg/dL). Liver Function Tests: Recent Labs  Lab 06/01/18 0051 06/02/18 0333  AST 25 18  ALT 14 12  ALKPHOS 80 59  BILITOT 0.6 1.1  PROT 6.4* 5.0*  ALBUMIN 3.3* 2.6*   No results for input(s): LIPASE, AMYLASE in the last 168 hours. No results for input(s): AMMONIA in the last 168 hours. Coagulation Profile: Recent Labs  Lab 06/01/18 1826 06/01/18 2357 06/02/18 0333  06/03/18 0520 06/04/18 0631  INR 1.23 1.19 1.26 1.16 1.12   Cardiac Enzymes: No results for input(s): CKTOTAL, CKMB, CKMBINDEX, TROPONINI in the last 168 hours. BNP (last 3 results) No results for input(s): PROBNP in the last 8760 hours. HbA1C: No results for input(s): HGBA1C in the last 72 hours. CBG: Recent Labs  Lab 06/03/18 1612 06/03/18 2102 06/04/18 0006 06/04/18 0435 06/04/18 0803  GLUCAP 116* 138* 86 144* 136*   Lipid Profile: No results for input(s): CHOL, HDL, LDLCALC, TRIG, CHOLHDL, LDLDIRECT in the last 72 hours. Thyroid Function Tests: No results for input(s): TSH, T4TOTAL, FREET4, T3FREE, THYROIDAB in the last 72 hours. Anemia Panel: No results for input(s): VITAMINB12, FOLATE,  FERRITIN, TIBC, IRON, RETICCTPCT in the last 72 hours. Urine analysis:    Component Value Date/Time   COLORURINE YELLOW 06/01/2018 [place] 06/01/2018 0647   LABSPEC 1.006 06/01/2018 0647   PHURINE 6.0 06/01/2018 0647   GLUCOSEU NEGATIVE 06/01/2018 0647   HGBUR MODERATE (A) 06/01/2018 0647   BILIRUBINUR NEGATIVE 06/01/2018 0647   KETONESUR NEGATIVE 06/01/2018 0647   PROTEINUR NEGATIVE 06/01/2018 0647   UROBILINOGEN 0.2 11/23/2013 1431   NITRITE POSITIVE (A) 06/01/2018 0647   LEUKOCYTESUR TRACE (A) 06/01/2018 0647   Sepsis Labs: @LABRCNTIP (procalcitonin:4,lacticidven:4)  ) Recent Results (from the past 240 hour(s))  Urine Culture     Status: Abnormal   Collection Time: 06/01/18  6:48 AM  Result Value Ref Range Status   Specimen Description URINE, RANDOM  Final   Special Requests   Final    NONE Performed at [facility], [place] [address] [place], [place] [address]    Culture >=100,000 COLONIES/mL ESCHERICHIA COLI (A)  Final   Report Status 06/03/2018 FINAL  Final   Organism ID, Bacteria ESCHERICHIA COLI (A)  Final      Susceptibility   Escherichia coli - MIC*    AMPICILLIN <=2 SENSITIVE Sensitive     CEFAZOLIN <=4 SENSITIVE Sensitive      CEFTRIAXONE <=1 SENSITIVE Sensitive     CIPROFLOXACIN <=0.25 SENSITIVE Sensitive     GENTAMICIN <=1 SENSITIVE Sensitive     IMIPENEM <=0.25 SENSITIVE Sensitive     NITROFURANTOIN <=16 SENSITIVE Sensitive     TRIMETH/SULFA <=20 SENSITIVE Sensitive     AMPICILLIN/SULBACTAM <=2 SENSITIVE Sensitive     PIP/TAZO <=4 SENSITIVE Sensitive     Extended ESBL NEGATIVE Sensitive     * >=100,000 COLONIES/mL ESCHERICHIA COLI  MRSA PCR Screening     Status: None   Collection Time: 06/01/18  7:58 AM  Result Value Ref Range Status   MRSA by PCR NEGATIVE NEGATIVE Final    Comment:        The GeneXpert MRSA Assay (FDA approved for NASAL specimens only), is one component of a comprehensive MRSA colonization surveillance program. It is not intended to diagnose MRSA infection nor to guide or monitor treatment for MRSA infections. Performed at [facility], [place] [address], [place], [place] [address]          Radiology Studies: No results found.      Scheduled Meds: . cephALEXin  500 mg Oral Q12H  . diltiazem  120 mg Oral Daily  . insulin aspart  0-9 Units Subcutaneous Q4H  . latanoprost  1 drop Both Eyes QHS  . mouth rinse  15 mL Mouth Rinse BID  . mometasone-formoterol  2 puff Inhalation BID  . pantoprazole  40 mg Oral BID  . sodium chloride flush  3 mL Intravenous Q12H  . timolol  1 drop Right Eye Daily  . Warfarin - Pharmacist Dosing Inpatient   Does not apply q1800   Continuous Infusions:   LOS: 3 days    Time spent: 25 minutes.    Lelon Frohlich, MD [facility] Pager [phone]  If 7PM-7AM, please contact night-coverage [email or web address] Password [email or web address] 06/04/2018, 8:06 AM

## 2018-06-04 NOTE — NC FL2 (Signed)
[place] MEDICAID FL2 LEVEL OF CARE SCREENING TOOL     IDENTIFICATION  Patient Name: [name] [name] Birthdate: 08-Nov-1929 Sex: female Admission Date (Current Location): 06/01/2018  [facility] and [place] Number:  Herbalist and Address:  [facility], [place] [address] [place], [place] [address]      Provider Number: [id number]  Attending Physician Name and Address:  Isaac Bliss, [place]  Relative Name and Phone Number:  [name], spouse, [phone]    Current Level of Care: Hospital Recommended Level of Care: [place] Prior Approval Number:    Date Approved/Denied:   PASRR Number: [id number]  Discharge Plan: SNF    Current Diagnoses: Patient Active Problem List   Diagnosis Date Noted  . Diverticulosis of colon without hemorrhage   . Hemorrhoids   . Rectal bleeding 06/01/2018  . Syncope   . Hematochezia   . Shock ([place])   . DM neuropathy, type II diabetes mellitus ([place]) 01/07/2018  . Hip fracture ([place]) 01/06/2018  . Advance care planning   . Goals of care, counseling/discussion   . COPD (chronic obstructive pulmonary disease) ([place]) 10/28/2017  . RLQ abdominal pain 04/24/2014  . Nausea alone 04/11/2014  . Abdominal pain 08/05/2012    Class: Acute  . Irritable bowel syndrome 04/12/2012  . Chronic diarrhea 04/12/2012  . Encounter for long-term (current) use of anticoagulants 03/17/2012  . Depression 03/13/2012  . Anxiety 03/13/2012  . Mitral regurgitation 03/11/2012  . Chronic epigastric pain 01/25/2012  . Coagulopathy ([place]) 12/12/2011  . Symptomatic anemia 12/12/2011  . Acute GI bleeding 12/12/2011  . Atrial fibrillation    . Hypokalemia 11/26/2011  . Leukocytosis 11/16/2011  . UTI (urinary tract infection) 11/13/2011  . DM II (diabetes mellitus, type II), controlled ([place]) 11/13/2011  . HTN (hypertension) 11/13/2011  . Hypercholesteremia 11/13/2011  . Glaucoma (increased eye pressure) 11/13/2011     Orientation RESPIRATION BLADDER Height & Weight     Self, Time, Situation, Place  Normal Continent, External catheter Weight: 61.6 kg Height:  5' (152.4 cm)  BEHAVIORAL SYMPTOMS/MOOD NEUROLOGICAL BOWEL NUTRITION STATUS      Continent Diet(Please see DC Summary)  AMBULATORY STATUS COMMUNICATION OF NEEDS Skin   Extensive Assist Verbally Normal                       Personal Care Assistance Level of Assistance  Bathing, Feeding, Dressing Bathing Assistance: Maximum assistance Feeding assistance: Independent Dressing Assistance: Limited assistance     Functional Limitations Info  Sight, Hearing, Speech Sight Info: Adequate Hearing Info: Adequate Speech Info: Adequate    SPECIAL CARE FACTORS FREQUENCY  PT (By licensed PT), OT (By licensed OT)     PT Frequency: 5x OT Frequency: 3x            Contractures Contractures Info: Not present    Additional Factors Info  Code Status, Allergies, Insulin Sliding Scale Code Status Info: Full Allergies Info: Allergies:  Lactose Intolerance (Gi), Penicillins, Sulfa Antibiotics, Ciprofloxacin, Sulfasalazine   Insulin Sliding Scale Info: Every 4 hours        Current Medications (06/04/2018):  This is the current hospital active medication list Current Facility-Administered Medications  Medication Dose Route Frequency Provider Last Rate Last Dose  . acetaminophen (TYLENOL) tablet 650 mg  650 mg Oral Q6H PRN Cirigliano, Vito V, DO   650 mg at 06/04/18 0012   Or  . acetaminophen (TYLENOL) suppository 650 mg  650 mg Rectal Q6H PRN Cirigliano, Dominic Pea,  DO      . cephALEXin (KEFLEX) capsule 500 mg  500 mg Oral Q12H Isaac Bliss, Rayford Halsted, MD   500 mg at 06/04/18 1012  . diltiazem (CARDIZEM CD) 24 hr capsule 120 mg  120 mg Oral Daily Patrecia Pour, Christean Grief, MD   120 mg at 06/04/18 1012  . hydroxypropyl methylcellulose / hypromellose (ISOPTO TEARS / GONIOVISC) 2.5 % ophthalmic solution 1 drop  1 drop Both Eyes Daily PRN Cirigliano,  Vito V, DO      . insulin aspart (novoLOG) injection 0-9 Units  0-9 Units Subcutaneous Q4H Cirigliano, Vito V, DO   1 Units at 06/04/18 0900  . latanoprost (XALATAN) 0.005 % ophthalmic solution 1 drop  1 drop Both Eyes QHS Cirigliano, Vito V, DO   1 drop at 06/03/18 2203  . MEDLINE mouth rinse  15 mL Mouth Rinse BID Cirigliano, Vito V, DO   15 mL at 06/03/18 2204  . mometasone-formoterol (DULERA) 200-5 MCG/ACT inhaler 2 puff  2 puff Inhalation BID Cirigliano, Vito V, DO   2 puff at 06/03/18 2105  . ondansetron (ZOFRAN) tablet 4 mg  4 mg Oral Q6H PRN Cirigliano, Vito V, DO       Or  . ondansetron (ZOFRAN) injection 4 mg  4 mg Intravenous Q6H PRN Cirigliano, Vito V, DO      . pantoprazole (PROTONIX) EC tablet 40 mg  40 mg Oral BID Cirigliano, Vito V, DO   40 mg at 06/04/18 1012  . sodium chloride flush (NS) 0.9 % injection 3 mL  3 mL Intravenous Q12H Cirigliano, Vito V, DO   3 mL at 06/03/18 2159  . timolol (TIMOPTIC) 0.5 % ophthalmic solution 1 drop  1 drop Right Eye Daily Cirigliano, Vito V, DO   1 drop at 06/04/18 1016  . warfarin (COUMADIN) tablet 2.5 mg  2.5 mg Oral ONCE-1800 Isaac Bliss, Rayford Halsted, MD      . Warfarin - Pharmacist Dosing Inpatient   Does not apply q1800 Masters, Jake Church, [facility]         Discharge Medications: Please see discharge summary for a list of discharge medications.  Relevant Imaging Results:  Relevant Lab Results:   Additional Information SSN: [id number]  Benard Halsted, LCSWA

## 2018-06-04 NOTE — Progress Notes (Signed)
ANTICOAGULATION CONSULT NOTE - Initial Consult  Pharmacy Consult for Warfarin Indication: atrial fibrillation  Allergies  Allergen Reactions  . Lactose Intolerance (Gi) Nausea And Vomiting and Other (See Comments)    severe stomach pain  . Penicillins Hives and Itching    "haven't used any since 1970's" - have gotten cephalosporins multiple times Has patient had a PCN reaction causing immediate rash, facial/tongue/throat swelling, SOB or lightheadedness with hypotension: Yes Has patient had a PCN reaction causing severe rash involving mucus membranes or skin necrosis: Unk Has patient had a PCN reaction that required hospitalization: Unk Has patient had a PCN reaction occurring within the last 10 years: No If all of the above answers are "NO", then may proceed with C  . Sulfa Antibiotics Hives and Itching    "haven't used any since 1970's"  . Ciprofloxacin Swelling    Site of swelling not recalled  . Sulfasalazine Hives and Itching    "haven't used any since 1970's"    Patient Measurements: Height: 5' (152.4 cm) Weight: 135 lb 12.9 oz (61.6 kg) IBW/kg (Calculated) : 45.5  Vital Signs: Temp: 97.6 F (36.4 C) (08/31 0600) Temp Source: Oral (08/31 0600) BP: 160/88 (08/31 0600) Pulse Rate: 102 (08/31 0600)  Labs: Recent Labs    06/02/18 0333 06/02/18 0838 06/02/18 1310 06/02/18 1312 06/03/18 0520 06/04/18 0631  HGB 12.6 12.4 12.2  --   --  12.6  HCT 37.5 38.0 37.1  --   --  37.5  PLT 154 153 153  --   --  212  LABPROT 15.7*  --   --   --  14.7 14.3  INR 1.26  --   --   --  1.16 1.12  CREATININE 0.83  --   --  0.72 0.74 0.73    Estimated Creatinine Clearance: 41.4 mL/min (by C-G formula based on SCr of 0.73 mg/dL).   Assessment: Patient is an 84 yo F presented with rectal bleeding and syncope. She was taking warfarin for Afib PTA ([facility] - age, female, DM) and presented with Hgb of 5.2 and therapeutic INR of 2.74. Recent INRs have been within goal of 2-3. Last  warfarin dose 8/27. Pt went into hypovolemic shock and found to have very large bloody BM. Was given KCentra and VKA.  INR subtherapeutic  Goal of Therapy:  INR 2-3 Monitor platelets by anticoagulation protocol: Yes   Plan:  Repeat Warfarin 2.5 mg po x 1 Daily INR Monitor for s/sxs bleeding  Thank you Anette Guarneri, PharmD [phone] 06/04/2018,11:08 AM

## 2018-06-05 LAB — CBC
HEMATOCRIT: 39.1 % (ref 36.0–46.0)
HEMOGLOBIN: 13.1 g/dL (ref 12.0–15.0)
MCH: 29 pg (ref 26.0–34.0)
MCHC: 33.5 g/dL (ref 30.0–36.0)
MCV: 86.5 fL (ref 78.0–100.0)
Platelets: 221 10*3/uL (ref 150–400)
RBC: 4.52 MIL/uL (ref 3.87–5.11)
RDW: 15.8 % — ABNORMAL HIGH (ref 11.5–15.5)
WBC: 12 10*3/uL — ABNORMAL HIGH (ref 4.0–10.5)

## 2018-06-05 LAB — GLUCOSE, CAPILLARY
GLUCOSE-CAPILLARY: 122 mg/dL — AB (ref 70–99)
GLUCOSE-CAPILLARY: 178 mg/dL — AB (ref 70–99)
Glucose-Capillary: 121 mg/dL — ABNORMAL HIGH (ref 70–99)
Glucose-Capillary: 138 mg/dL — ABNORMAL HIGH (ref 70–99)
Glucose-Capillary: 139 mg/dL — ABNORMAL HIGH (ref 70–99)
Glucose-Capillary: 159 mg/dL — ABNORMAL HIGH (ref 70–99)

## 2018-06-05 LAB — PROTIME-INR
INR: 1.16
Prothrombin Time: 14.7 seconds (ref 11.4–15.2)

## 2018-06-05 MED ORDER — WARFARIN SODIUM 5 MG PO TABS
5.0000 mg | ORAL_TABLET | Freq: Once | ORAL | Status: AC
  Administered 2018-06-05: 5 mg via ORAL
  Filled 2018-06-05: qty 1

## 2018-06-05 NOTE — Plan of Care (Signed)
Pt ambulated halls and tolerated well. Pt walked with walker the length of hallway. Tolerated well.

## 2018-06-05 NOTE — Progress Notes (Signed)
[place] for Warfarin Indication: atrial fibrillation  Allergies  Allergen Reactions  . Lactose Intolerance (Gi) Nausea And Vomiting and Other (See Comments)    severe stomach pain  . Penicillins Hives and Itching    "haven't used any since 1970's" - have gotten cephalosporins multiple times Has patient had a PCN reaction causing immediate rash, facial/tongue/throat swelling, SOB or lightheadedness with hypotension: Yes Has patient had a PCN reaction causing severe rash involving mucus membranes or skin necrosis: Unk Has patient had a PCN reaction that required hospitalization: Unk Has patient had a PCN reaction occurring within the last 10 years: No If all of the above answers are "NO", then may proceed with C  . Sulfa Antibiotics Hives and Itching    "haven't used any since 1970's"  . Ciprofloxacin Swelling    Site of swelling not recalled  . Sulfasalazine Hives and Itching    "haven't used any since 1970's"    Patient Measurements: Height: 5' (152.4 cm) Weight: 139 lb 1.8 oz (63.1 kg) IBW/kg (Calculated) : 45.5  Vital Signs: Temp: 97.8 F (36.6 C) (09/01 0655) Temp Source: Oral (09/01 0655) BP: 142/72 (09/01 0655) Pulse Rate: 89 (09/01 0655)  Labs: Recent Labs    06/02/18 1310 06/02/18 1312 06/03/18 0520 06/04/18 0631 06/05/18 0412  HGB 12.2  --   --  12.6 13.1  HCT 37.1  --   --  37.5 39.1  PLT 153  --   --  212 221  LABPROT  --   --  14.7 14.3 14.7  INR  --   --  1.16 1.12 1.16  CREATININE  --  0.72 0.74 0.73  --     Estimated Creatinine Clearance: 41.8 mL/min (by C-G formula based on SCr of 0.73 mg/dL).   Assessment: Patient is an 84 yo F presented with rectal bleeding and syncope. She was taking warfarin for Afib PTA (CHADS Va[facility] - age, female, DM) and presented with Hgb of 5.2 and therapeutic INR of 2.74. Recent INRs have been within goal of 2-3. Last warfarin dose 8/27. Pt went into hypovolemic shock and found to  have very large bloody BM. Was given KCentra and VKA.  INR subtherapeutic and no increase after 2 doses of 2.5 mg  Goal of Therapy:  INR 2-3 Monitor platelets by anticoagulation protocol: Yes   Plan:  Warfarin 5 mg po x 1 (to overcome Vitamin K) Daily INR Monitor for s/sxs bleeding  Thank you Anette Guarneri, PharmD [phone] 06/05/2018,11:57 AM

## 2018-06-05 NOTE — Progress Notes (Signed)
PROGRESS NOTE    [name] [name]  IRW:[id number] DOB: 1930/03/01 DOA: 06/01/2018 PCP: [name], MD    Brief Narrative:  84 year old female who presented with rectal bleeding and syncope.  She does have significant past medical history for chronic atrial fibrillation, anxiety, type 2 diabetes mellitus and COPD.  Patient reported 2 episodes of bright red blood per rectum.  Orthostatic symptoms after standing up from the commode and experienced a syncope episode.  Physical examination blood pressure 124/61, heart rate 88, respiratory rate 23, oxygen saturation 98%.  Patient was pale, mucous membranes, lungs clear to auscultation bilaterally, heart S1-S2 present, irregularly irregular, abdomen soft nontender, nondistended, no lower extremity edema.  140, potassium 3.4, chloride 112, bicarb 21, glucose 121, BUN 10, creatinine 0.8, white count 11.6, hemoglobin 12.6, hematocrit 37.5, platelets 154, INR 1.26, urinalysis negative for infection.  Chest x-ray with right rotation, right hemidiaphragm elevation.  EKG with atrial fibrillation rhythm.  Patient was admitted to the hospital with working diagnosis of lower GI bleed complicated by symptomatic acute blood loss anemia and syncope.   Assessment & Plan:   Principal Problem:   Symptomatic anemia Active Problems:   Leukocytosis   Atrial fibrillation    Coagulopathy (HCC)   Acute GI bleeding   COPD (chronic obstructive pulmonary disease) (HCC)   DM neuropathy, type II diabetes mellitus ([place])   Rectal bleeding   Hematochezia   Shock ([place])   Diverticulosis of colon without hemorrhage   Hemorrhoids   1. Symptomatic acute blood loss anemia due to lower gastrointestinal bleeding/ diverticular bleeding. Sp 4 units prbc transfusion. Patient remains high risk for bleeding, warfarin has been resumed, will continue close monitoring. Today had diarrhea but no melena or hematochezia. Heart rate at 92 and respiratory rate at 21. Hb at 13 and Hct  at 39. Follow on cell count in am.   2. Atrial fibrillation. Continue rate control with diltiazem, warfarin has been resumed for anticoagulation, INR at 1,16, continue dosing per pharmacy protocol.   3. COPD. Stable with no signs of exacerbation, will continue oxymetry monitoring. Continue dulera.   4. T2DM. Will continue glucose cover and monitoring with insulin sliding scale. Capillary glucose 171, 139, 138, 122, 159.   5. Anxiety. Continue neuro checks per unit protocol.   DVT prophylaxis: warfarin   Code Status:  full Family Communication: no family at the bedside  Disposition Plan/ discharge barriers: pending placement at snf    Consultants:   GI   Procedures:   Colonoscopy   Antimicrobials:   Cephalexin     Subjective: Positive diarrhea this am, no nausea or vomiting, feeling very weak and deconditioned. No chest pain or dyspnea.   Objective: Vitals:   06/04/18 1318 06/04/18 2135 06/05/18 0655 06/05/18 0713  BP: (!) 144/76 (!) 141/69 (!) 142/72   Pulse: 87 92 89   Resp: 16 18 (!) 21   Temp: 98 F (36.7 C) 97.9 F (36.6 C) 97.8 F (36.6 C)   TempSrc: Oral  Oral   SpO2: 97% 97% 96% 98%  Weight:   63.1 kg   Height:        Intake/Output Summary (Last 24 hours) at 06/05/2018 0933 Last data filed at 06/04/2018 2100 Gross per 24 hour  Intake 293 ml  Output -  Net 293 ml   Filed Weights   06/02/18 0416 06/03/18 0500 06/05/18 0655  Weight: 61.9 kg 61.6 kg 63.1 kg    Examination:   General: deconditioned  Neurology: Awake and alert, non focal  E ENT: mild pallor, no icterus, oral mucosa moist Cardiovascular: No JVD. S1-S2 present, irregularly irregular, no gallops, rubs, or murmurs. No lower extremity edema. Pulmonary: vesicular breath sounds bilaterally, adequate air movement, no wheezing, rhonchi or rales. Gastrointestinal. Abdomen with no organomegaly, non tender, no rebound or guarding Skin. No rashes Musculoskeletal: no joint  deformities     Data Reviewed: I have personally reviewed following labs and imaging studies  CBC: Recent Labs  Lab 06/01/18 2357  06/02/18 0333 06/02/18 0838 06/02/18 1310 06/04/18 0631 06/05/18 0412  WBC 13.4*  --  11.6* 11.6* 11.9* 11.4* 12.0*  NEUTROABS 9.5*  --  8.1* 8.0* 8.9* 7.4  --   HGB 13.2   < > 12.6 12.4 12.2 12.6 13.1  HCT 39.3   < > 37.5 38.0 37.1 37.5 39.1  MCV 85.1  --  85.4 85.6 86.1 86.2 86.5  PLT 158  --  154 153 153 212 221   < > = values in this interval not displayed.   Basic Metabolic Panel: Recent Labs  Lab 06/01/18 0051 06/02/18 0333 06/02/18 1310 06/02/18 1312 06/03/18 0520 06/04/18 0631  NA 136 140  --  143 139 139  K 3.7 3.4*  --  3.1* 3.8 3.4*  CL 102 112*  --  112* 112* 109  CO2 21* 21*  --  21* 22 19*  GLUCOSE 196* 121*  --  121* 136* 133*  BUN 13 10  --  8 7* 7*  CREATININE 0.97 0.83  --  0.72 0.74 0.73  CALCIUM 8.9 7.9*  --  7.4* 8.2* 8.7*  MG  --   --  1.5*  --   --   --    GFR: Estimated Creatinine Clearance: 41.8 mL/min (by C-G formula based on SCr of 0.73 mg/dL). Liver Function Tests: Recent Labs  Lab 06/01/18 0051 06/02/18 0333  AST 25 18  ALT 14 12  ALKPHOS 80 59  BILITOT 0.6 1.1  PROT 6.4* 5.0*  ALBUMIN 3.3* 2.6*   No results for input(s): LIPASE, AMYLASE in the last 168 hours. No results for input(s): AMMONIA in the last 168 hours. Coagulation Profile: Recent Labs  Lab 06/01/18 2357 06/02/18 0333 06/03/18 0520 06/04/18 0631 06/05/18 0412  INR 1.19 1.26 1.16 1.12 1.16   Cardiac Enzymes: No results for input(s): CKTOTAL, CKMB, CKMBINDEX, TROPONINI in the last 168 hours. BNP (last 3 results) No results for input(s): PROBNP in the last 8760 hours. HbA1C: No results for input(s): HGBA1C in the last 72 hours. CBG: Recent Labs  Lab 06/04/18 1656 06/04/18 2011 06/05/18 0038 06/05/18 0435 06/05/18 0803  GLUCAP 95 171* 139* 138* 122*   Lipid Profile: No results for input(s): CHOL, HDL, LDLCALC, TRIG,  CHOLHDL, LDLDIRECT in the last 72 hours. Thyroid Function Tests: Recent Labs    06/04/18 0951  TSH 1.846   Anemia Panel: Recent Labs    06/04/18 0951  VITAMINB12 218      Radiology Studies: I have reviewed all of the imaging during this hospital visit personally     Scheduled Meds: . cephALEXin  500 mg Oral Q12H  . diltiazem  120 mg Oral Daily  . insulin aspart  0-9 Units Subcutaneous Q4H  . latanoprost  1 drop Both Eyes QHS  . mouth rinse  15 mL Mouth Rinse BID  . mometasone-formoterol  2 puff Inhalation BID  . pantoprazole  40 mg Oral BID  . sodium chloride flush  3 mL Intravenous Q12H  . timolol  1 drop  Right Eye Daily  . Warfarin - Pharmacist Dosing Inpatient   Does not apply q1800   Continuous Infusions:   LOS: 4 days        Kayleb Warshaw Gerome Apley, MD [facility] Pager [phone]

## 2018-06-06 LAB — CBC WITH DIFFERENTIAL/PLATELET
ABS IMMATURE GRANULOCYTES: 0.2 10*3/uL — AB (ref 0.0–0.1)
BASOS ABS: 0.1 10*3/uL (ref 0.0–0.1)
Basophils Relative: 1 %
Eosinophils Absolute: 0.2 10*3/uL (ref 0.0–0.7)
Eosinophils Relative: 1 %
HCT: 39.4 % (ref 36.0–46.0)
HEMOGLOBIN: 13.1 g/dL (ref 12.0–15.0)
Immature Granulocytes: 1 %
LYMPHS PCT: 21 %
Lymphs Abs: 2.8 10*3/uL (ref 0.7–4.0)
MCH: 29 pg (ref 26.0–34.0)
MCHC: 33.2 g/dL (ref 30.0–36.0)
MCV: 87.4 fL (ref 78.0–100.0)
Monocytes Absolute: 1 10*3/uL (ref 0.1–1.0)
Monocytes Relative: 8 %
NEUTROS ABS: 9 10*3/uL — AB (ref 1.7–7.7)
NEUTROS PCT: 68 %
Platelets: 226 10*3/uL (ref 150–400)
RBC: 4.51 MIL/uL (ref 3.87–5.11)
RDW: 15.6 % — ABNORMAL HIGH (ref 11.5–15.5)
WBC: 13.1 10*3/uL — AB (ref 4.0–10.5)

## 2018-06-06 LAB — GLUCOSE, CAPILLARY
GLUCOSE-CAPILLARY: 119 mg/dL — AB (ref 70–99)
GLUCOSE-CAPILLARY: 138 mg/dL — AB (ref 70–99)
GLUCOSE-CAPILLARY: 150 mg/dL — AB (ref 70–99)
Glucose-Capillary: 139 mg/dL — ABNORMAL HIGH (ref 70–99)
Glucose-Capillary: 153 mg/dL — ABNORMAL HIGH (ref 70–99)
Glucose-Capillary: 171 mg/dL — ABNORMAL HIGH (ref 70–99)
Glucose-Capillary: 180 mg/dL — ABNORMAL HIGH (ref 70–99)

## 2018-06-06 LAB — PROTIME-INR
INR: 1.14
Prothrombin Time: 14.5 seconds (ref 11.4–15.2)

## 2018-06-06 MED ORDER — LOPERAMIDE HCL 2 MG PO CAPS
2.0000 mg | ORAL_CAPSULE | ORAL | Status: DC | PRN
  Administered 2018-06-06 – 2018-06-08 (×2): 2 mg via ORAL
  Filled 2018-06-06 (×2): qty 1

## 2018-06-06 MED ORDER — WARFARIN SODIUM 4 MG PO TABS
4.0000 mg | ORAL_TABLET | Freq: Once | ORAL | Status: AC
  Administered 2018-06-06: 4 mg via ORAL
  Filled 2018-06-06: qty 1

## 2018-06-06 NOTE — Care Management Important Message (Signed)
Important Message  Patient Details  Name: [name] [name] MRN: [id number] Date of Birth: 28-Oct-1929   Medicare Important Message Given:  Yes    Bettina Warn 06/06/2018, 3:52 PM

## 2018-06-06 NOTE — Progress Notes (Signed)
[facility] for Warfarin Indication: atrial fibrillation  Allergies  Allergen Reactions  . Lactose Intolerance (Gi) Nausea And Vomiting and Other (See Comments)    severe stomach pain  . Penicillins Hives and Itching    "haven't used any since 1970's" - have gotten cephalosporins multiple times Has patient had a PCN reaction causing immediate rash, facial/tongue/throat swelling, SOB or lightheadedness with hypotension: Yes Has patient had a PCN reaction causing severe rash involving mucus membranes or skin necrosis: Unk Has patient had a PCN reaction that required hospitalization: Unk Has patient had a PCN reaction occurring within the last 10 years: No If all of the above answers are "NO", then may proceed with C  . Sulfa Antibiotics Hives and Itching    "haven't used any since 1970's"  . Ciprofloxacin Swelling    Site of swelling not recalled  . Sulfasalazine Hives and Itching    "haven't used any since 1970's"    Patient Measurements: Height: 5' (152.4 cm) Weight: 139 lb 1.8 oz (63.1 kg) IBW/kg (Calculated) : 45.5  Vital Signs: Temp: 98.1 F (36.7 C) (09/02 0626) Temp Source: Oral (09/02 0626) BP: 131/76 (09/02 0626) Pulse Rate: 80 (09/02 0626)  Labs: Recent Labs    06/04/18 0631 06/05/18 0412 06/06/18 0417  HGB 12.6 13.1 13.1  HCT 37.5 39.1 39.4  PLT 212 221 226  LABPROT 14.3 14.7 14.5  INR 1.12 1.16 1.14  CREATININE 0.73  --   --     Estimated Creatinine Clearance: 41.8 mL/min (by C-G formula based on SCr of 0.73 mg/dL).   Assessment: Patient is an 84 yo F presented with rectal bleeding and syncope. She was taking warfarin for Afib PTA ([facility] - age, female, DM) and presented with Hgb of 5.2 and therapeutic INR of 2.74. Recent INRs had been within goal of 2-3. Last warfarin dose 8/27. Pt went into hypovolemic shock and found to have very large bloody BM. Was given KCentra and VKA.  INR subtherapeutic and no increase  after 2 doses of 2.5 mg.  INR dropped slightly to 1.14 today  Hgb 13.1 (much improved),S/p  4 units prbc's No re-bleed noted (had lower GI/diverticular bleeding initially) ; on 9/1 pt had diarrhea but no melena or hematochezia.   Goal of Therapy:  INR 2-3 Monitor platelets by anticoagulation protocol: Yes   Plan:  Warfarin 4 mg po x 1 (to overcome Vitamin K) Daily INR Monitor for s/sxs bleeding  Thank you Nicole Cella, RPh Clinical Pharmacist Pager: [phone] Please check AMION for all [facility] phone numbers After 10:00 PM, call [place] [phone] 06/06/2018,9:17 AM

## 2018-06-06 NOTE — Clinical Social Work Note (Signed)
Clinical Social Work Assessment  Patient Details  Name: [name] [name] MRN: [id number] Date of Birth: 06-25-1930  Date of referral:  06/06/18               Reason for consult:  Facility Placement, Care Management Concerns, Discharge Planning                Permission sought to share information with:  Facility Sport and exercise psychologist, Family Supports Permission granted to share information::  Yes, Verbal Permission Granted  Name::     Scientist, clinical (histocompatibility and immunogenetics)::  SN[employer]  Relationship::  Spouse  Contact Information:  [phone]  Housing/Transportation Living arrangements for the past 2 months:  [place] of Information:  Patient, Spouse Patient Interpreter Needed:  None Criminal Activity/Legal Involvement Pertinent to Current Situation/Hospitalization:  No - Comment as needed Significant Relationships:  Spouse Lives with:  Spouse Do you feel safe going back to the place where you live?  No Need for family participation in patient care:  No (Coment)  Care giving concerns:  CSW received consult for discharge planning and introduced role to patient. Patient is interested in rehab and requested we call her spouse. Spouse was called and agreed he is unable to care for patient, and is interested in [facility] for SNF placement, as they have previously used [facility], however [place] would be a more convenient drive for husband. CSW to continue to follow and assist with discharge planning needs.   Social Worker assessment / plan: Spoke with patient regarding SNF, patient requested [facility] and for CSW to speak to husband. Spoke with husband regarding SNF.  Employment status:  Retired Nurse, adult PT Recommendations:  [place] / Referral to community resources:  [place]  Patient/Family's Response to care:  Husband confirmed SNF placement of [facility] was priority, he reports he really liked [facility] in the past  however [facility] is closer to him and less travel time. CSW described insurance auth process which may take up to 3 days. Patients spouse stated understanding and awareness of this process.  Patient/Family's Understanding of and Emotional Response to Diagnosis, Current Treatment, and Prognosis:  Patient/family is realistic regarding therapy needs and expressed being hopefull for [facility] placement. Patient expressed understanding of CSW role and discharge process as well as medical condition. No questions/concerns about plan or treatment at this time.   Emotional Assessment Appearance:  Appears stated age Attitude/Demeanor/Rapport:  Gracious, Engaged Affect (typically observed):  Appropriate, Accepting, Calm, Pleasant Orientation:  Oriented to Self, Oriented to Situation, Oriented to Place, Oriented to  Time Alcohol / Substance use:  Not Applicable Psych involvement (Current and /or in the community):  No (Comment)  Discharge Needs  Concerns to be addressed:  Care Coordination Readmission within the last 30 days:  No Current discharge risk:  Dependent with Mobility, Physical Impairment Barriers to Discharge:  Continued Medical Work up   [employer], LCSW 06/06/2018, 10:44 AM

## 2018-06-06 NOTE — Progress Notes (Signed)
PROGRESS NOTE    [name] [name]  PYK:[id number] DOB: [birth year] DOA: 06/01/2018 PCP: [name], MD    Brief Narrative:  84 year old female who presented with rectal bleeding and syncope.  She does have significant past medical history for chronic atrial fibrillation, anxiety, type 2 diabetes mellitus and COPD.  Patient reported 2 episodes of bright red blood per rectum.  Orthostatic symptoms after standing up from the commode and experienced a syncope episode.  Physical examination blood pressure 124/61, heart rate 88, respiratory rate 23, oxygen saturation 98%.  Patient was pale, mucous membranes, lungs clear to auscultation bilaterally, heart S1-S2 present, irregularly irregular, abdomen soft nontender, nondistended, no lower extremity edema.  140, potassium 3.4, chloride 112, bicarb 21, glucose 121, BUN 10, creatinine 0.8, white count 11.6, hemoglobin 12.6, hematocrit 37.5, platelets 154, INR 1.26, urinalysis negative for infection.  Chest x-ray with right rotation, right hemidiaphragm elevation.  EKG with atrial fibrillation rhythm.  Patient was admitted to the hospital with working diagnosis of lower GI bleed complicated by symptomatic acute blood loss anemia and syncope.   Assessment & Plan:   Principal Problem:   Symptomatic anemia Active Problems:   Leukocytosis   Atrial fibrillation    Coagulopathy (HCC)   Acute GI bleeding   COPD (chronic obstructive pulmonary disease) (HCC)   DM neuropathy, type II diabetes mellitus ([place])   Rectal bleeding   Hematochezia   Shock ([place])   Diverticulosis of colon without hemorrhage   Hemorrhoids   1. Symptomatic acute blood loss anemia due to lower gastrointestinal bleeding/ diverticular bleeding. Sp 4 units prbc transfusion. Patient does have high risk for bleeding due to anticoagulation, will continue close monitoring hb and hct. Patient will need snf at discharge. Continue antiacid therapy.   2. New acute on chronic diarrhea.  Will resume loperamide as needed, noted mid elevation in wbc, antibiotics have been discontinued. If persistent and worsening diarrhea along with leukocytosis, will consider c diff testing.    2. Atrial fibrillation. On diltiazem for rate control and  warfarin for anticoagulation. INR 1,14 today.   3. COPD.  No current clinical signs of exacerbation. On dulera.   4. T2DM. Glucose cover and monitoring with insulin sliding scale. Capillary glucose 178, 138, 150, 153, 119. Holding on basal insulin for now.   5. Anxiety. No confusion or agitation.   DVT prophylaxis: warfarin   Code Status:  full Family Communication: no family at the bedside  Disposition Plan/ discharge barriers: pending placement at snf    Consultants:   GI   Procedures:   Colonoscopy   Antimicrobials:   Cephalexin     Subjective: Patient had loose stools, yesterday. This am continue to be loose. At home take imodium. No nausea or vomiting, no chest pain or dyspnea.   Objective: Vitals:   06/05/18 2004 06/05/18 2045 06/06/18 0626 06/06/18 0717  BP:  127/67 131/76   Pulse: 88 (!) 111 80   Resp: 20 20 18    Temp:  98 F (36.7 C) 98.1 F (36.7 C)   TempSrc:  Oral Oral   SpO2: 98% 96% 97% 90%  Weight:      Height:        Intake/Output Summary (Last 24 hours) at 06/06/2018 1113 Last data filed at 06/06/2018 0930 Gross per 24 hour  Intake 240 ml  Output 1100 ml  Net -860 ml   Filed Weights   06/02/18 0416 06/03/18 0500 06/05/18 0655  Weight: 61.9 kg 61.6 kg 63.1 kg    Examination:  General: deconditioned  Neurology: Awake and alert, non focal  E ENT: mild pallor, no icterus, oral mucosa moist Cardiovascular: No JVD. S1-S2 present, rhythmic, no gallops, rubs, or murmurs. No lower extremity edema. Pulmonary: positive breath sounds bilaterally, adequate air movement, no wheezing, rhonchi or rales. Gastrointestinal. Abdomen protuberant with no organomegaly, non tender, no rebound or  guarding Skin. No rashes Musculoskeletal: no joint deformities     Data Reviewed: I have personally reviewed following labs and imaging studies  CBC: Recent Labs  Lab 06/02/18 0333 06/02/18 0838 06/02/18 1310 06/04/18 0631 06/05/18 0412 06/06/18 0417  WBC 11.6* 11.6* 11.9* 11.4* 12.0* 13.1*  NEUTROABS 8.1* 8.0* 8.9* 7.4  --  9.0*  HGB 12.6 12.4 12.2 12.6 13.1 13.1  HCT 37.5 38.0 37.1 37.5 39.1 39.4  MCV 85.4 85.6 86.1 86.2 86.5 87.4  PLT 154 153 153 212 221 832   Basic Metabolic Panel: Recent Labs  Lab 06/01/18 0051 06/02/18 0333 06/02/18 1310 06/02/18 1312 06/03/18 0520 06/04/18 0631  NA 136 140  --  143 139 139  K 3.7 3.4*  --  3.1* 3.8 3.4*  CL 102 112*  --  112* 112* 109  CO2 21* 21*  --  21* 22 19*  GLUCOSE 196* 121*  --  121* 136* 133*  BUN 13 10  --  8 7* 7*  CREATININE 0.97 0.83  --  0.72 0.74 0.73  CALCIUM 8.9 7.9*  --  7.4* 8.2* 8.7*  MG  --   --  1.5*  --   --   --    GFR: Estimated Creatinine Clearance: 41.8 mL/min (by C-G formula based on SCr of 0.73 mg/dL). Liver Function Tests: Recent Labs  Lab 06/01/18 0051 06/02/18 0333  AST 25 18  ALT 14 12  ALKPHOS 80 59  BILITOT 0.6 1.1  PROT 6.4* 5.0*  ALBUMIN 3.3* 2.6*   No results for input(s): LIPASE, AMYLASE in the last 168 hours. No results for input(s): AMMONIA in the last 168 hours. Coagulation Profile: Recent Labs  Lab 06/02/18 0333 06/03/18 0520 06/04/18 0631 06/05/18 0412 06/06/18 0417  INR 1.26 1.16 1.12 1.16 1.14   Cardiac Enzymes: No results for input(s): CKTOTAL, CKMB, CKMBINDEX, TROPONINI in the last 168 hours. BNP (last 3 results) No results for input(s): PROBNP in the last 8760 hours. HbA1C: No results for input(s): HGBA1C in the last 72 hours. CBG: Recent Labs  Lab 06/05/18 1751 06/05/18 2005 06/06/18 0049 06/06/18 0407 06/06/18 0845  GLUCAP 121* 178* 138* 150* 153*   Lipid Profile: No results for input(s): CHOL, HDL, LDLCALC, TRIG, CHOLHDL, LDLDIRECT in the  last 72 hours. Thyroid Function Tests: Recent Labs    06/04/18 0951  TSH 1.846   Anemia Panel: Recent Labs    06/04/18 0951  VITAMINB12 218      Radiology Studies: I have reviewed all of the imaging during this hospital visit personally     Scheduled Meds: . diltiazem  120 mg Oral Daily  . insulin aspart  0-9 Units Subcutaneous Q4H  . latanoprost  1 drop Both Eyes QHS  . mouth rinse  15 mL Mouth Rinse BID  . mometasone-formoterol  2 puff Inhalation BID  . pantoprazole  40 mg Oral BID  . sodium chloride flush  3 mL Intravenous Q12H  . timolol  1 drop Right Eye Daily  . warfarin  4 mg Oral ONCE-1800  . Warfarin - Pharmacist Dosing Inpatient   Does not apply q1800   Continuous Infusions:  LOS: 5 days        Tawni Millers, MD [facility] Pager [phone]

## 2018-06-07 LAB — CBC WITH DIFFERENTIAL/PLATELET
ABS IMMATURE GRANULOCYTES: 0.2 10*3/uL — AB (ref 0.0–0.1)
BASOS ABS: 0.1 10*3/uL (ref 0.0–0.1)
BASOS PCT: 1 %
EOS ABS: 0.2 10*3/uL (ref 0.0–0.7)
Eosinophils Relative: 1 %
HCT: 37.2 % (ref 36.0–46.0)
Hemoglobin: 12.7 g/dL (ref 12.0–15.0)
IMMATURE GRANULOCYTES: 1 %
Lymphocytes Relative: 23 %
Lymphs Abs: 2.8 10*3/uL (ref 0.7–4.0)
MCH: 29.3 pg (ref 26.0–34.0)
MCHC: 34.1 g/dL (ref 30.0–36.0)
MCV: 85.7 fL (ref 78.0–100.0)
MONOS PCT: 8 %
Monocytes Absolute: 1 10*3/uL (ref 0.1–1.0)
NEUTROS ABS: 8.3 10*3/uL — AB (ref 1.7–7.7)
NEUTROS PCT: 66 %
PLATELETS: 224 10*3/uL (ref 150–400)
RBC: 4.34 MIL/uL (ref 3.87–5.11)
RDW: 15.3 % (ref 11.5–15.5)
WBC: 12.4 10*3/uL — AB (ref 4.0–10.5)

## 2018-06-07 LAB — PROTIME-INR
INR: 1.48
PROTHROMBIN TIME: 17.8 s — AB (ref 11.4–15.2)

## 2018-06-07 LAB — GLUCOSE, CAPILLARY
GLUCOSE-CAPILLARY: 180 mg/dL — AB (ref 70–99)
Glucose-Capillary: 148 mg/dL — ABNORMAL HIGH (ref 70–99)
Glucose-Capillary: 136 mg/dL — ABNORMAL HIGH (ref 70–99)
Glucose-Capillary: 132 mg/dL — ABNORMAL HIGH (ref 70–99)
Glucose-Capillary: 165 mg/dL — ABNORMAL HIGH (ref 70–99)

## 2018-06-07 MED ORDER — GLUCERNA SHAKE PO LIQD
237.0000 mL | Freq: Two times a day (BID) | ORAL | Status: DC
  Administered 2018-06-07 – 2018-06-09 (×4): 237 mL via ORAL

## 2018-06-07 MED ORDER — PANTOPRAZOLE SODIUM 40 MG PO TBEC: 40.0000 mg | DELAYED_RELEASE_TABLET | Freq: Two times a day (BID) | ORAL | 0 refills | Status: AC

## 2018-06-07 MED ORDER — DICLOFENAC SODIUM 1 % TD GEL: 2.0000 g | Freq: Four times a day (QID) | TRANSDERMAL | 0 refills | Status: DC

## 2018-06-07 MED ORDER — DICLOFENAC SODIUM 1 % TD GEL
2.0000 g | Freq: Four times a day (QID) | TRANSDERMAL | Status: DC
  Administered 2018-06-07 – 2018-06-09 (×7): 2 g via TOPICAL
  Filled 2018-06-07: qty 100

## 2018-06-07 MED ORDER — WARFARIN SODIUM 2.5 MG PO TABS
2.5000 mg | ORAL_TABLET | Freq: Once | ORAL | Status: AC
  Administered 2018-06-07: 2.5 mg via ORAL
  Filled 2018-06-07: qty 1

## 2018-06-07 MED ORDER — LORAZEPAM 1 MG PO TABS: 1.0000 mg | ORAL_TABLET | Freq: Three times a day (TID) | ORAL | 0 refills | Status: DC | PRN

## 2018-06-07 NOTE — Progress Notes (Signed)
Per [facility] requires updated PT note. Will send update to them after patient is seen today.   Percell Locus Suraj Ramdass LCSW [phone]

## 2018-06-07 NOTE — Progress Notes (Signed)
Physical Therapy Treatment Patient Details Name: [name] [name] MRN: [id number] DOB: [birth year] Today's Date: 06/07/2018    History of Present Illness 84 yo female with hx of chronic afib on coumadin, anxiety,TIIDM, and COPD admitted 8/28 for a GI bleed after presenting to the ED with syncope, witnessed rectal bleeding & a Hb of 5.2. She was fluid resuscitated, Hb repleted to 14. Colonoscopy performed 06/02/18 Clinical presentation most consistent with self-limiting acute diverticular bleed.     PT Comments    Patient seen for mobility progression. Pt requires assistance for safe OOB mobility and limited this session by R ankle pain and 2/4 DOE. Continue to progress as tolerated with anticipated d/c to SNF for further skilled PT services.     Follow Up Recommendations  SNF     Equipment Recommendations  None recommended by PT    Recommendations for Other Services       Precautions / Restrictions Precautions Precautions: Fall Precaution Comments: hx of hip fx Restrictions Weight Bearing Restrictions: No    Mobility  Bed Mobility Overal bed mobility: Needs Assistance Bed Mobility: Rolling;Sit to Sidelying Rolling: Supervision       Sit to sidelying: Mod assist General bed mobility comments: assist to bring bilat LE into bed  Transfers Overall transfer level: Needs assistance Equipment used: Rolling walker (2 wheeled) Transfers: Sit to/from Stand Sit to Stand: Min assist         General transfer comment: assist to power up into standing   Ambulation/Gait Ambulation/Gait assistance: Min assist Gait Distance (Feet): 50 Feet Assistive device: Rolling walker (2 wheeled) Gait Pattern/deviations: Step-through pattern;Decreased stride length;Trunk flexed;Decreased stance time - right;Antalgic Gait velocity: decreased   General Gait Details: seated break required due to R ankle pain; assist for balance and cues for posture and safe use of AD   Stairs              Wheelchair Mobility    Modified Rankin (Stroke Patients Only)       Balance Overall balance assessment: Needs assistance Sitting-balance support: No upper extremity supported;Feet supported Sitting balance-Leahy Scale: Good     Standing balance support: Bilateral upper extremity supported Standing balance-Leahy Scale: Poor                              Cognition Arousal/Alertness: Awake/alert Behavior During Therapy: WFL for tasks assessed/performed Overall Cognitive Status: Within Functional Limits for tasks assessed                                        Exercises      General Comments General comments (skin integrity, edema, etc.): pt with 2/4 DOE with mobility       Pertinent Vitals/Pain Pain Assessment: Faces Faces Pain Scale: Hurts little more Pain Location: R ankle  Pain Descriptors / Indicators: Guarding;Grimacing;Sore Pain Intervention(s): Limited activity within patient's tolerance;Monitored during session;Repositioned;Patient requesting pain meds-RN notified    Home Living                      Prior Function            PT Goals (current goals can now be found in the care plan section) Acute Rehab PT Goals PT Goal Formulation: With patient/family Time For Goal Achievement: 06/17/18 Potential to Achieve Goals: Fair Progress towards PT goals: Progressing toward goals  Frequency    Min 2X/week      PT Plan Current plan remains appropriate    Co-evaluation              AM-PAC PT "6 Clicks" Daily Activity  Outcome Measure  Difficulty turning over in bed (including adjusting bedclothes, sheets and blankets)?: A Little Difficulty moving from lying on back to sitting on the side of the bed? : A Little Difficulty sitting down on and standing up from a chair with arms (e.g., wheelchair, bedside commode, etc,.)?: Unable Help needed moving to and from a bed to chair (including a wheelchair)?: A  Little Help needed walking in hospital room?: A Little Help needed climbing 3-5 steps with a railing? : A Lot 6 Click Score: 15    End of Session Equipment Utilized During Treatment: Gait belt Activity Tolerance: Patient limited by pain Patient left: in bed;with call bell/phone within reach;with family/visitor present Nurse Communication: Mobility status PT Visit Diagnosis: Unsteadiness on feet (R26.81);Other abnormalities of gait and mobility (R26.89);Muscle weakness (generalized) (M62.81);History of falling (Z91.81);Difficulty in walking, not elsewhere classified (R26.2)     Time: 6160-7371 PT Time Calculation (min) (ACUTE ONLY): 27 min  Charges:  $Gait Training: 8-22 mins $Therapeutic Activity: 8-22 mins                     Earney Navy, PTA Pager: [phone]     Darliss Cheney 06/07/2018, 4:14 PM

## 2018-06-07 NOTE — Consult Note (Signed)
[facility] CM Inpatient Consult   06/07/2018  Grettell Ransdell 11-Feb-1930 [id number]  Follow up:  Patient is for skilled nursing facility for post hospital transition of care. Met with the patient and her husband at the bedside regarding being restarted with [facility] services.  They consent to post hospital transition.  He states, "we've hoping she will be able to go to [facility] [SNF] for some rehab today." Patient with [facility].  Endorses Dr. [name] with [facility] as her primary care provider.  This office provides the transition of care follow up when patient is transitioning home. There is a written consent form on file with [facility] Care Management and  a brochure, 24 hour nurse advise line magnet with contact information given.  Of note, [facility] Care Management services does not replace or interfere with any services that are arranged by inpatient case management or social work. For additional questions or referrals please contact:  [name], RN BSN [facility] Liaison  [phone] business mobile phone Toll free office [phone]

## 2018-06-07 NOTE — Progress Notes (Signed)
Nutrition Follow-up  INTERVENTION:   - Glucerna Shake po BID between meals, each supplement provides 220 kcal and 10 grams of protein  - HS snack  NUTRITION DIAGNOSIS:   Inadequate oral intake related to poor appetite as evidenced by per patient/family report.  Improving  GOAL:   Patient will meet greater than or equal to 90% of their needs  Progressing  MONITOR:   Diet advancement, PO intake  REASON FOR ASSESSMENT:   Malnutrition Screening Tool    ASSESSMENT:   84 yo female with PMH of pancreatitis, HTN, HLD, GERD, DM-2, and COPD who was admitted on 8/28 with symptomatic anemia, syncope, related to rectal bleeding.  8/28 - v-fib with brief LOC related to large bloody stools with clots, CCM consulted 8/30 - colonoscopy showing diverticular disease, non-bleeding hemorrhoids, and 2 mm polyp  Noted PT recommending SNF at this time.  Spoke with pt at bedside who reports that she is experiencing pain in her ankle and that she is not getting out of bed. Pt believes she may have injured her ankle when she "passed out a few days ago." Pt states MD is aware.  Pt states that she is not eating much at meals as her appetite is poor. Pt reports that this has been the case for 1 year and that she has lost 20-30 lbs during this time. Pt has enjoyed having graham crackers and peanut butter for a snack. RD to order HS snack.  Pt agreeable to RD re-ordering Glucerna BID between meals.  Meal Completion: 10-75% since 8/31  Medications reviewed and include: sliding scale Novolog, 40 mg Protonix BID, warfarin  Labs reviewed: potassium 3.4 (L), CO2 19 (L) CBG's: 136, 148, 171, 180, 139, 119 x 24 hours  UOP: 1500 ml x 24 hours I/O's: +3.8 L since admission  Diet Order:   Diet Order            Diet heart healthy/carb modified Room service appropriate? Yes; Fluid consistency: Thin  Diet effective now              EDUCATION NEEDS:   No education needs have been identified at  this time  Skin:  Skin Assessment: Reviewed RN Assessment  Last BM:  06/06/18 - small type 7  Height:   Ht Readings from Last 1 Encounters:  06/01/18 5' (1.524 m)    Weight:   Wt Readings from Last 1 Encounters:  06/05/18 63.1 kg    Ideal Body Weight:  45.5 kg  BMI:  Body mass index is 27.17 kg/m.  Estimated Nutritional Needs:   Kcal:  1400-1600  Protein:  65-75 gm  Fluid:  1.4-1.6 L    Gaynell Face, MS, RD, LDN Pager: [phone] Weekend/After Hours: [phone]

## 2018-06-07 NOTE — Progress Notes (Signed)
[place] for Warfarin Indication: atrial fibrillation  Allergies  Allergen Reactions  . Lactose Intolerance (Gi) Nausea And Vomiting and Other (See Comments)    severe stomach pain  . Penicillins Hives and Itching    "haven't used any since 1970's" - have gotten cephalosporins multiple times Has patient had a PCN reaction causing immediate rash, facial/tongue/throat swelling, SOB or lightheadedness with hypotension: Yes Has patient had a PCN reaction causing severe rash involving mucus membranes or skin necrosis: Unk Has patient had a PCN reaction that required hospitalization: Unk Has patient had a PCN reaction occurring within the last 10 years: No If all of the above answers are "NO", then may proceed with C  . Sulfa Antibiotics Hives and Itching    "haven't used any since 1970's"  . Ciprofloxacin Swelling    Site of swelling not recalled  . Sulfasalazine Hives and Itching    "haven't used any since 1970's"    Patient Measurements: Height: 5' (152.4 cm) Weight: 139 lb 1.8 oz (63.1 kg) IBW/kg (Calculated) : 45.5  Vital Signs: Temp: 97.9 F (36.6 C) (09/03 0419) Temp Source: Oral (09/03 0419) BP: 131/72 (09/03 0419) Pulse Rate: 89 (09/03 0419)  Labs: Recent Labs    06/05/18 0412 06/06/18 0417 06/07/18 0510  HGB 13.1 13.1 12.7  HCT 39.1 39.4 37.2  PLT 221 226 224  LABPROT 14.7 14.5 17.8*  INR 1.16 1.14 1.48    Estimated Creatinine Clearance: 41.8 mL/min (by C-G formula based on SCr of 0.73 mg/dL).   Assessment: Patient is an 84 yo F on Coumadin 2.5mg  daily exc 1.25mg  on Wed/Sun PTA for AFib on hold 2/2 to GIB. Given Kcentra and Vit K on admit.  Now Coumadin resumed 8/30. CHADS-VASc 4. INR trending up to 1.48. CBC stable  Goal of Therapy:  INR 2-3 Monitor platelets by anticoagulation protocol: Yes   Plan:  Coumadin 2.5mg  PO x 1  Monitor daily INR, CBC, s/s of bleed  Elenor Quinones, PharmD, BCPS Clinical  Pharmacist Phone number [phone] 06/07/2018 8:28 AM

## 2018-06-07 NOTE — Plan of Care (Signed)
Complain of headache gave PO tylenol per pt feels better will continue tomitor

## 2018-06-07 NOTE — Discharge Summary (Signed)
Physician Discharge Summary  [name] [name] JJK:[id number] DOB: May 12, 1930 DOA: 06/01/2018  PCP: [name], MD  Admit date: 06/01/2018 Discharge date: 06/07/2018  Admitted From: Home  Disposition:  SNF   Recommendations for Outpatient Follow-up and new medication changes:  1. Follow up with Dr. Philip Aspen in 7 days 2. Please obtain INR in 48 hours, target INR 2 to 3.   Home Health: na   Equipment/Devices: na   Discharge Condition: stable  CODE STATUS: full  Diet recommendation: Heart healthy and diabetic prudent   Brief/Interim Summary: 84 year old female who presented with rectal bleeding and syncope. She does have significant past medical history for chronic atrial fibrillation, anxiety, type 2 diabetes mellitus and COPD. Patient reported 2 episodes of bright red blood per rectum.Orthostatic symptoms after standing up from the commode and experienced a syncope episode. Physical examination blood pressure 124/61, heart rate 88, respiratory rate 23, oxygen saturation 98%.Patient was pale, moist mucous membranes, lungs clear to auscultation bilaterally, heart S1-S2 present, irregularly irregular, abdomen soft nontender, nondistended, no lower extremity edema.  Sodium136, potassium 3.7, chloride 102, bicarb 31, glucose 26, BUN 13, creatinine 0.97, white count 19.4, hemoglobin 5.2, hematocrit 16.3, platelets 358, urinalysis negative for infection. Chest x-ray with right rotation, right hemidiaphragm elevation. EKG with atrial fibrillation rhythm.  Patient was admitted to the hospital with working diagnosis of lower GI bleed complicated bysymptomatic acute blood loss anemia andsyncope.  1.  Symptomatic acute blood loss anemia due to lower gastrointestinal bleeding/ diverticular bleeding. Sp 4 units prbc transfusion.  Patient was admitted to the stepdown unit, she was placed on a remote telemetry monitor, patient received 2 units of packed red blood cells transfusion on  admission.  Patient had recurrent profuse bleeding, associated with altered mentation and bradycardia.  Patient was transferred to the intensive care unit, received 2 more units of PRBC transfusion and vitamin K to revert warfarin induced coagulopathy.  Further work-up with colonoscopy showed diverticulosis in sigmoid colon and in the distal descending colon.  Her hemoglobin and hematocrit at discharge was 12.7 and 37.2.  Warfarin has been resumed.  2.  Chronic atrial fibrillation.  Controlled with diltiazem, anticoagulation has been resumed for cerebrovascular accident prophylaxis.  Discharge INR is 1,48.  Patient will resume metoprolol at discharge 25 mg twice  3.  Acute on chronic diarrhea.  Patient was placed back on loperamide with improvement of her symptoms,  4.  COPD.  Has remained stable with no signs of exacerbation. Continue Symbicort.   5.  Type 2 diabetes mellitus.  Capillary glucose remained stable, he was placed on insulin sliding scale.  Resume glimepiride and Lantus 22 units at discharge  6.  Anxiety.  No confusion or agitation.  Continue as needed lorazepam.  Resumed divalproex and mirtazapine at discharge.  Discharge Diagnoses:  Principal Problem:   Symptomatic anemia Active Problems:   Leukocytosis   Atrial fibrillation    Coagulopathy (HCC)   Acute GI bleeding   COPD (chronic obstructive pulmonary disease) (HCC)   DM neuropathy, type II diabetes mellitus ([place])   Rectal bleeding   Hematochezia   Shock ([place])   Diverticulosis of colon without hemorrhage   Hemorrhoids    Discharge Instructions   Allergies as of 06/07/2018      Reactions   Lactose Intolerance (gi) Nausea And Vomiting, Other (See Comments)   severe stomach pain   Penicillins Hives, Itching   "haven't used any since 1970's" - have gotten cephalosporins multiple times Has patient had a PCN reaction causing immediate  rash, facial/tongue/throat swelling, SOB or lightheadedness with hypotension:  Yes Has patient had a PCN reaction causing severe rash involving mucus membranes or skin necrosis: Unk Has patient had a PCN reaction that required hospitalization: Unk Has patient had a PCN reaction occurring within the last 10 years: No If all of the above answers are "NO", then may proceed with C   Sulfa Antibiotics Hives, Itching   "haven't used any since 1970's"   Ciprofloxacin Swelling   Site of swelling not recalled   Sulfasalazine Hives, Itching   "haven't used any since 1970's"      Medication List    STOP taking these medications   HYDROcodone-acetaminophen 5-325 MG tablet Commonly known as:  NORCO/VICODIN   omeprazole 20 MG capsule Commonly known as:  PRILOSEC     TAKE these medications   diclofenac sodium 1 % Gel Commonly known as:  VOLTAREN Apply 2 g topically 4 (four) times daily. Apply to right ankle.   diltiazem 120 MG 24 hr capsule Commonly known as:  DILACOR XR TAKE 1 CAPSULE(120 MG) BY MOUTH DAILY What changed:    how much to take  how to take this  when to take this   divalproex 125 MG capsule Commonly known as:  DEPAKOTE SPRINKLE Take 125 mg by mouth 2 (two) times daily.   EQ RESTORE PLUS LUBRICANT EYE 0.5 % Soln Generic drug:  Carboxymethylcellulose Sod PF Apply 1 drop to eye daily as needed.   gabapentin 300 MG capsule Commonly known as:  NEURONTIN Take 300 mg by mouth daily.   glimepiride 4 MG tablet Commonly known as:  AMARYL Take 4 mg by mouth daily with breakfast.   lactose free nutrition Liqd Take 237 mLs by mouth as needed.   LANTUS SOLOSTAR 100 UNIT/ML Solostar Pen Generic drug:  Insulin Glargine Inject 22 Units into the skin daily.   latanoprost 0.005 % ophthalmic solution Commonly known as:  XALATAN Place 1 drop into both eyes at bedtime.   loperamide 2 MG tablet Commonly known as:  IMODIUM A-D Take 1 tablet (2 mg total) by mouth as needed for diarrhea or loose stools (use at bedtime). What changed:  when to take  this   LORazepam 1 MG tablet Commonly known as:  ATIVAN Take 1 tablet (1 mg total) by mouth every 8 (eight) hours as needed.   methylphenidate 5 MG tablet Commonly known as:  RITALIN Take 1 tablet (5 mg total) by mouth See admin instructions. take 5mg  in the morning daily and then 5mg  twice a day on every other day What changed:  when to take this   metoprolol tartrate 25 MG tablet Commonly known as:  LOPRESSOR TAKE 1 TABLET(25 MG) BY MOUTH TWICE DAILY WITH FOOD What changed:  See the new instructions.   mirtazapine 7.5 MG tablet Commonly known as:  REMERON TAKE 1 TABLET(7.5 MG) BY MOUTH AT BEDTIME What changed:  See the new instructions.   multivitamin with minerals Tabs tablet Take 1 tablet by mouth daily.   oxybutynin 5 MG tablet Commonly known as:  DITROPAN Take 1 tablet by mouth at bedtime.   pantoprazole 40 MG tablet Commonly known as:  PROTONIX Take 1 tablet (40 mg total) by mouth 2 (two) times daily.   promethazine 25 MG tablet Commonly known as:  PHENERGAN Take 25 mg by mouth 3 (three) times daily as needed for nausea or vomiting.   sucralfate 1 g tablet Commonly known as:  CARAFATE Take 1 g by mouth 2 (two)  times daily.   SYMBICORT 160-4.5 MCG/ACT inhaler Generic drug:  budesonide-formoterol Inhale 2 puffs into the lungs 2 (two) times daily.   warfarin 5 MG tablet Commonly known as:  COUMADIN Take 0.5-1 tablets (2.5-5 mg total) by mouth daily. Take 1.25mg  every Sun/Wed then 2.5 mg the rest of the week What changed:  additional instructions      Follow-up Information    [facility] Follow up.   Specialty:  Gastroenterology Why:  Call or schedule a follow-up appointment as needed.  Contact information: [place] [address] [phone]         Allergies  Allergen Reactions  . Lactose Intolerance (Gi) Nausea And Vomiting and Other (See Comments)    severe stomach pain  . Penicillins Hives and  Itching    "haven't used any since 1970's" - have gotten cephalosporins multiple times Has patient had a PCN reaction causing immediate rash, facial/tongue/throat swelling, SOB or lightheadedness with hypotension: Yes Has patient had a PCN reaction causing severe rash involving mucus membranes or skin necrosis: Unk Has patient had a PCN reaction that required hospitalization: Unk Has patient had a PCN reaction occurring within the last 10 years: No If all of the above answers are "NO", then may proceed with C  . Sulfa Antibiotics Hives and Itching    "haven't used any since 1970's"  . Ciprofloxacin Swelling    Site of swelling not recalled  . Sulfasalazine Hives and Itching    "haven't used any since 1970's"    Consultations:  GI   Procedures/Studies: Ct Head Wo Contrast  Result Date: 05/16/2018 CLINICAL DATA:  Vision problems while ambulating EXAM: CT HEAD WITHOUT CONTRAST TECHNIQUE: Contiguous axial images were obtained from the base of the skull through the vertex without intravenous contrast. COMPARISON:  01/06/2018 FINDINGS: Brain: Diffuse atrophic changes are identified. Mild chronic white matter ischemic change is seen. No findings to suggest acute hemorrhage, acute infarction or space-occupying mass lesion are noted. Vascular: No hyperdense vessel or unexpected calcification. Skull: Normal. Negative for fracture or focal lesion. Sinuses/Orbits: No acute finding. Other: None. IMPRESSION: Chronic atrophic and ischemic changes without acute abnormality. Electronically Signed   By: Inez Catalina M.D.   On: 05/16/2018 19:59   Mr Brain Wo Contrast  Result Date: 05/16/2018 CLINICAL DATA:  84 y/o  F; vision problems. EXAM: MRI HEAD WITHOUT CONTRAST TECHNIQUE: Multiplanar, multiecho pulse sequences of the brain and surrounding structures were obtained without intravenous contrast. COMPARISON:  05/16/2018 CT head.  03/05/2004 MRI head. FINDINGS: Brain: No acute infarction, hemorrhage,  hydrocephalus, extra-axial collection or mass lesion. Several nonspecific T2 FLAIR hyperintensities in subcortical and periventricular white matter are compatible with mild chronic microvascular ischemic changes for age. Moderate volume loss of the brain. Vascular: Normal flow voids. Skull and upper cervical spine: Normal marrow signal. Sinuses/Orbits: Negative. Other: Bilateral intra-ocular lens replacement. IMPRESSION: 1. No acute intracranial abnormality. 2. Mild for age chronic microvascular ischemic changes and moderate volume loss of the brain. Mild progression from 2005. Electronically Signed   By: Kristine Garbe M.D.   On: 05/16/2018 23:32   Dg Chest Port 1 View  Result Date: 06/01/2018 CLINICAL DATA:  84 y/o  F; syncope and weakness. EXAM: PORTABLE CHEST 1 VIEW COMPARISON:  01/09/2018 chest radiograph FINDINGS: Stable cardiac silhouette within normal limits given projection and technique. Dense mitral annular calcification. Aortic atherosclerosis with calcification. Clear lungs. No pleural effusion or pneumothorax. No acute osseous abnormality is evident. Right upper quadrant cholecystectomy clips. IMPRESSION:  No active disease. Aortic Atherosclerosis (ICD10-I70.0). Electronically Signed   By: Kristine Garbe M.D.   On: 06/01/2018 04:28       Subjective: Patient is feeling better, has been out of bed, no nausea or vomiting, continue to be very weak and deconditioned.   Discharge Exam: Vitals:   06/06/18 2052 06/07/18 0419  BP: (!) 145/85 131/72  Pulse: (!) 110 89  Resp: 20 19  Temp: (!) 97.5 F (36.4 C) 97.9 F (36.6 C)  SpO2: 98% 100%   Vitals:   06/06/18 1403 06/06/18 2007 06/06/18 2052 06/07/18 0419  BP: 123/64  (!) 145/85 131/72  Pulse: 95  (!) 110 89  Resp: 14  20 19   Temp: (!) 97.5 F (36.4 C)  (!) 97.5 F (36.4 C) 97.9 F (36.6 C)  TempSrc: Oral  Oral Oral  SpO2: 98% 98% 98% 100%  Weight:      Height:        General: Not in pain or  dyspnea Neurology: Awake and alert, non focal  E ENT: mild pallor, no icterus, oral mucosa moist Cardiovascular: No JVD. S1-S2 present, rhythmic, no gallops, rubs, or murmurs. No lower extremity edema. Pulmonary: vesicular breath sounds bilaterally, adequate air movement, no wheezing, rhonchi or rales. Gastrointestinal. Abdomen with, no organomegaly, non tender, no rebound or guarding Skin. No rashes Musculoskeletal: no joint deformities   The results of significant diagnostics from this hospitalization (including imaging, microbiology, ancillary and laboratory) are listed below for reference.     Microbiology: Recent Results (from the past 240 hour(s))  Urine Culture     Status: Abnormal   Collection Time: 06/01/18  6:48 AM  Result Value Ref Range Status   Specimen Description URINE, RANDOM  Final   Special Requests   Final    NONE Performed at [facility], [address], [place], [place] [address]    Culture >=100,000 COLONIES/mL ESCHERICHIA COLI (A)  Final   Report Status 06/03/2018 FINAL  Final   Organism ID, Bacteria ESCHERICHIA COLI (A)  Final      Susceptibility   Escherichia coli - MIC*    AMPICILLIN <=2 SENSITIVE Sensitive     CEFAZOLIN <=4 SENSITIVE Sensitive     CEFTRIAXONE <=1 SENSITIVE Sensitive     CIPROFLOXACIN <=0.25 SENSITIVE Sensitive     GENTAMICIN <=1 SENSITIVE Sensitive     IMIPENEM <=0.25 SENSITIVE Sensitive     NITROFURANTOIN <=16 SENSITIVE Sensitive     TRIMETH/SULFA <=20 SENSITIVE Sensitive     AMPICILLIN/SULBACTAM <=2 SENSITIVE Sensitive     PIP/TAZO <=4 SENSITIVE Sensitive     Extended ESBL NEGATIVE Sensitive     * >=100,000 COLONIES/mL ESCHERICHIA COLI  MRSA PCR Screening     Status: None   Collection Time: 06/01/18  7:58 AM  Result Value Ref Range Status   MRSA by PCR NEGATIVE NEGATIVE Final    Comment:        The GeneXpert MRSA Assay (FDA approved for NASAL specimens only), is one component of a comprehensive MRSA  colonization surveillance program. It is not intended to diagnose MRSA infection nor to guide or monitor treatment for MRSA infections. Performed at [facility], [employer] [address] [place], [place] [address]      Labs: BNP (last 3 results) No results for input(s): BNP in the last 8760 hours. Basic Metabolic Panel: Recent Labs  Lab 06/01/18 0051 06/02/18 0333 06/02/18 1310 06/02/18 1312 06/03/18 0520 06/04/18 0631  NA 136 140  --  143 139  139  K 3.7 3.4*  --  3.1* 3.8 3.4*  CL 102 112*  --  112* 112* 109  CO2 21* 21*  --  21* 22 19*  GLUCOSE 196* 121*  --  121* 136* 133*  BUN 13 10  --  8 7* 7*  CREATININE 0.97 0.83  --  0.72 0.74 0.73  CALCIUM 8.9 7.9*  --  7.4* 8.2* 8.7*  MG  --   --  1.5*  --   --   --    Liver Function Tests: Recent Labs  Lab 06/01/18 0051 06/02/18 0333  AST 25 18  ALT 14 12  ALKPHOS 80 59  BILITOT 0.6 1.1  PROT 6.4* 5.0*  ALBUMIN 3.3* 2.6*   No results for input(s): LIPASE, AMYLASE in the last 168 hours. No results for input(s): AMMONIA in the last 168 hours. CBC: Recent Labs  Lab 06/02/18 0838 06/02/18 1310 06/04/18 0631 06/05/18 0412 06/06/18 0417 06/07/18 0510  WBC 11.6* 11.9* 11.4* 12.0* 13.1* 12.4*  NEUTROABS 8.0* 8.9* 7.4  --  9.0* 8.3*  HGB 12.4 12.2 12.6 13.1 13.1 12.7  HCT 38.0 37.1 37.5 39.1 39.4 37.2  MCV 85.6 86.1 86.2 86.5 87.4 85.7  PLT 153 153 212 221 226 224   Cardiac Enzymes: No results for input(s): CKTOTAL, CKMB, CKMBINDEX, TROPONINI in the last 168 hours. BNP: Invalid input(s): POCBNP CBG: Recent Labs  Lab 06/06/18 2053 06/06/18 2345 06/07/18 0418 06/07/18 0753 06/07/18 1152  GLUCAP 180* 171* 148* 136* 132*   D-Dimer No results for input(s): DDIMER in the last 72 hours. Hgb A1c No results for input(s): HGBA1C in the last 72 hours. Lipid Profile No results for input(s): CHOL, HDL, LDLCALC, TRIG, CHOLHDL, LDLDIRECT in the last 72 hours. Thyroid function studies No results for input(s): TSH,  T4TOTAL, T3FREE, THYROIDAB in the last 72 hours.  Invalid input(s): FREET3 Anemia work up No results for input(s): VITAMINB12, FOLATE, FERRITIN, TIBC, IRON, RETICCTPCT in the last 72 hours. Urinalysis    Component Value Date/Time   COLORURINE YELLOW 06/01/2018 [place] 06/01/2018 0647   LABSPEC 1.006 06/01/2018 0647   PHURINE 6.0 06/01/2018 0647   GLUCOSEU NEGATIVE 06/01/2018 0647   HGBUR MODERATE (A) 06/01/2018 0647   BILIRUBINUR NEGATIVE 06/01/2018 0647   KETONESUR NEGATIVE 06/01/2018 0647   PROTEINUR NEGATIVE 06/01/2018 0647   UROBILINOGEN 0.2 11/23/2013 1431   NITRITE POSITIVE (A) 06/01/2018 0647   LEUKOCYTESUR TRACE (A) 06/01/2018 0647   Sepsis Labs Invalid input(s): PROCALCITONIN,  WBC,  LACTICIDVEN Microbiology Recent Results (from the past 240 hour(s))  Urine Culture     Status: Abnormal   Collection Time: 06/01/18  6:48 AM  Result Value Ref Range Status   Specimen Description URINE, RANDOM  Final   Special Requests   Final    NONE Performed at [facility], [place] [address] [place], [place] [address]    Culture >=100,000 COLONIES/mL ESCHERICHIA COLI (A)  Final   Report Status 06/03/2018 FINAL  Final   Organism ID, Bacteria ESCHERICHIA COLI (A)  Final      Susceptibility   Escherichia coli - MIC*    AMPICILLIN <=2 SENSITIVE Sensitive     CEFAZOLIN <=4 SENSITIVE Sensitive     CEFTRIAXONE <=1 SENSITIVE Sensitive     CIPROFLOXACIN <=0.25 SENSITIVE Sensitive     GENTAMICIN <=1 SENSITIVE Sensitive     IMIPENEM <=0.25 SENSITIVE Sensitive     NITROFURANTOIN <=16 SENSITIVE Sensitive     TRIMETH/SULFA <=20 SENSITIVE Sensitive  AMPICILLIN/SULBACTAM <=2 SENSITIVE Sensitive     PIP/TAZO <=4 SENSITIVE Sensitive     Extended ESBL NEGATIVE Sensitive     * >=100,000 COLONIES/mL ESCHERICHIA COLI  MRSA PCR Screening     Status: None   Collection Time: 06/01/18  7:58 AM  Result Value Ref Range Status   MRSA by PCR NEGATIVE NEGATIVE Final    Comment:         The GeneXpert MRSA Assay (FDA approved for NASAL specimens only), is one component of a comprehensive MRSA colonization surveillance program. It is not intended to diagnose MRSA infection nor to guide or monitor treatment for MRSA infections. Performed at [facility], [place] [address], [place], [place] [address]      Time coordinating discharge: 45 minutes  SIGNED:   Tawni Millers, MD  [facility] 06/07/2018, 12:34 PM Pager [phone]  If 7PM-7AM, please contact night-coverage [email or web address] Password [email or web address]

## 2018-06-08 LAB — GLUCOSE, CAPILLARY
Glucose-Capillary: 138 mg/dL — ABNORMAL HIGH (ref 70–99)
Glucose-Capillary: 139 mg/dL — ABNORMAL HIGH (ref 70–99)
Glucose-Capillary: 140 mg/dL — ABNORMAL HIGH (ref 70–99)
Glucose-Capillary: 142 mg/dL — ABNORMAL HIGH (ref 70–99)
Glucose-Capillary: 186 mg/dL — ABNORMAL HIGH (ref 70–99)
Glucose-Capillary: 216 mg/dL — ABNORMAL HIGH (ref 70–99)

## 2018-06-08 LAB — PROTIME-INR
INR: 1.62
PROTHROMBIN TIME: 19.1 s — AB (ref 11.4–15.2)

## 2018-06-08 MED ORDER — INSULIN ASPART 100 UNIT/ML FLEXPEN: PEN_INJECTOR | SUBCUTANEOUS | 11 refills | Status: DC

## 2018-06-08 MED ORDER — WARFARIN SODIUM 2.5 MG PO TABS
2.5000 mg | ORAL_TABLET | Freq: Once | ORAL | Status: AC
  Administered 2018-06-08: 2.5 mg via ORAL
  Filled 2018-06-08: qty 1

## 2018-06-08 MED ORDER — LORAZEPAM 1 MG PO TABS: 1.0000 mg | ORAL_TABLET | Freq: Three times a day (TID) | ORAL | 0 refills | Status: DC | PRN

## 2018-06-08 MED ORDER — INSULIN ASPART 100 UNIT/ML ~~LOC~~ SOLN: 0.0000 [IU] | SUBCUTANEOUS | 11 refills | Status: DC

## 2018-06-08 NOTE — Progress Notes (Signed)
CSW updated patient's spouse that we are still waiting on insurance approval.  Cedric Fishman LCSW [phone]

## 2018-06-08 NOTE — Progress Notes (Signed)
[employer] reviewing updated PT note for SNF.   [name] Locus Reaghan Kawa LCSW [phone]

## 2018-06-08 NOTE — Discharge Summary (Signed)
Physician Discharge Summary  Liesa Tsan YME:[id number] DOB: Jun 21, 1930 DOA: 06/01/2018  PCP: [name], MD  Admit date: 06/01/2018 Discharge date: 06/08/2018  Admitted From: Home  Disposition:  SNF   Recommendations for Outpatient Follow-up and new medication changes:  1. Follow up with Dr. Philip Aspen in 7 days 2. Please obtain INR in 48 hours, target INR 2 to 3.  3. Follow-up with gastroenterology 4. CBC, BMP weekly  Home Health: na   Equipment/Devices: na   Discharge Condition: stable  CODE STATUS: full  Diet recommendation: Heart healthy and diabetic prudent   Brief/Interim Summary: 84 year old female who presented with rectal bleeding and syncope. She does have significant past medical history for chronic atrial fibrillation, anxiety, type 2 diabetes mellitus and COPD. Patient reported 2 episodes of bright red blood per rectum.Orthostatic symptoms after standing up from the commode and experienced a syncope episode. Physical examination blood pressure 124/61, heart rate 88, respiratory rate 23, oxygen saturation 98%.Patient was pale, moist mucous membranes, lungs clear to auscultation bilaterally, heart S1-S2 present, irregularly irregular, abdomen soft nontender, nondistended, no lower extremity edema.  Sodium136, potassium 3.7, chloride 102, bicarb 31, glucose 26, BUN 13, creatinine 0.97, white count 19.4, hemoglobin 5.2, hematocrit 16.3, platelets 358, urinalysis negative for infection. Chest x-ray with right rotation, right hemidiaphragm elevation. EKG with atrial fibrillation rhythm.  Patient was admitted to the hospital with working diagnosis of lower GI bleed complicated bysymptomatic acute blood loss anemia andsyncope.  1.  Symptomatic acute blood loss anemia due to lower gastrointestinal bleeding/ diverticular bleeding. Sp 4 units prbc transfusion.  Patient was admitted to the stepdown unit, she was placed on a remote telemetry monitor, patient received  2 units of packed red blood cells transfusion on admission.  Patient had recurrent profuse bleeding, associated with altered mentation and bradycardia.  Patient was transferred to the intensive care unit, received 2 more units of PRBC transfusion and vitamin K to revert warfarin induced coagulopathy.  Further work-up with colonoscopy showed diverticulosis in sigmoid colon and in the distal descending colon.  Her hemoglobin and hematocrit at discharge was 12.7 and 37.2.  Warfarin has been resumed.  2.  Chronic atrial fibrillation.  Controlled with diltiazem, anticoagulation has been resumed for cerebrovascular accident prophylaxis.  Discharge INR is 1,48.  Patient will resume metoprolol at discharge 25 mg twice  3.  Acute on chronic diarrhea.  Patient was placed back on loperamide with improvement of her symptoms,  4.  COPD.  Has remained stable with no signs of exacerbation. Continue Symbicort.   5.  Type 2 diabetes mellitus.  Capillary glucose remained stable, he was placed on insulin sliding scale.  Resume glimepiride and Lantus 22 units at discharge  6.  Anxiety.  No confusion or agitation.  Continue as needed lorazepam.  Resumed divalproex and mirtazapine at discharge.  Discharge Diagnoses:  Principal Problem:   Symptomatic anemia Active Problems:   Leukocytosis   Atrial fibrillation    Coagulopathy (HCC)   Acute GI bleeding   COPD (chronic obstructive pulmonary disease) (HCC)   DM neuropathy, type II diabetes mellitus ([place])   Rectal bleeding   Hematochezia   Shock ([place])   Diverticulosis of colon without hemorrhage   Hemorrhoids    Discharge Instructions  Discharge Instructions    Diet - low sodium heart healthy   Complete by:  As directed    Discharge instructions   Complete by:  As directed    Please follow with primary care in 7 days.   Increase activity  slowly   Complete by:  As directed      Allergies as of 06/08/2018      Reactions   Lactose Intolerance (gi) Nausea  And Vomiting, Other (See Comments)   severe stomach pain   Penicillins Hives, Itching   "haven't used any since 1970's" - have gotten cephalosporins multiple times Has patient had a PCN reaction causing immediate rash, facial/tongue/throat swelling, SOB or lightheadedness with hypotension: Yes Has patient had a PCN reaction causing severe rash involving mucus membranes or skin necrosis: Unk Has patient had a PCN reaction that required hospitalization: Unk Has patient had a PCN reaction occurring within the last 10 years: No If all of the above answers are "NO", then may proceed with C   Sulfa Antibiotics Hives, Itching   "haven't used any since 1970's"   Ciprofloxacin Swelling   Site of swelling not recalled   Sulfasalazine Hives, Itching   "haven't used any since 1970's"      Medication List    STOP taking these medications   HYDROcodone-acetaminophen 5-325 MG tablet Commonly known as:  NORCO/VICODIN   omeprazole 20 MG capsule Commonly known as:  PRILOSEC     TAKE these medications   diclofenac sodium 1 % Gel Commonly known as:  VOLTAREN Apply 2 g topically 4 (four) times daily. Apply to right ankle.   diltiazem 120 MG 24 hr capsule Commonly known as:  DILACOR XR TAKE 1 CAPSULE(120 MG) BY MOUTH DAILY What changed:    how much to take  how to take this  when to take this   divalproex 125 MG capsule Commonly known as:  DEPAKOTE SPRINKLE Take 125 mg by mouth 2 (two) times daily.   EQ RESTORE PLUS LUBRICANT EYE 0.5 % Soln Generic drug:  Carboxymethylcellulose Sod PF Apply 1 drop to eye daily as needed.   gabapentin 300 MG capsule Commonly known as:  NEURONTIN Take 300 mg by mouth daily.   glimepiride 4 MG tablet Commonly known as:  AMARYL Take 4 mg by mouth daily with breakfast.   insulin aspart 100 UNIT/ML injection Commonly known as:  novoLOG Inject 0-9 Units into the skin every 4 (four) hours.   lactose free nutrition Liqd Take 237 mLs by mouth as  needed.   LANTUS SOLOSTAR 100 UNIT/ML Solostar Pen Generic drug:  Insulin Glargine Inject 22 Units into the skin daily.   latanoprost 0.005 % ophthalmic solution Commonly known as:  XALATAN Place 1 drop into both eyes at bedtime.   loperamide 2 MG tablet Commonly known as:  IMODIUM A-D Take 1 tablet (2 mg total) by mouth as needed for diarrhea or loose stools (use at bedtime). What changed:  when to take this   LORazepam 1 MG tablet Commonly known as:  ATIVAN Take 1 tablet (1 mg total) by mouth every 8 (eight) hours as needed.   methylphenidate 5 MG tablet Commonly known as:  RITALIN Take 1 tablet (5 mg total) by mouth See admin instructions. take 5mg  in the morning daily and then 5mg  twice a day on every other day What changed:  when to take this   metoprolol tartrate 25 MG tablet Commonly known as:  LOPRESSOR TAKE 1 TABLET(25 MG) BY MOUTH TWICE DAILY WITH FOOD What changed:  See the new instructions.   mirtazapine 7.5 MG tablet Commonly known as:  REMERON TAKE 1 TABLET(7.5 MG) BY MOUTH AT BEDTIME What changed:  See the new instructions.   multivitamin with minerals Tabs tablet Take 1 tablet by  mouth daily.   oxybutynin 5 MG tablet Commonly known as:  DITROPAN Take 1 tablet by mouth at bedtime.   pantoprazole 40 MG tablet Commonly known as:  PROTONIX Take 1 tablet (40 mg total) by mouth 2 (two) times daily.   promethazine 25 MG tablet Commonly known as:  PHENERGAN Take 25 mg by mouth 3 (three) times daily as needed for nausea or vomiting.   sucralfate 1 g tablet Commonly known as:  CARAFATE Take 1 g by mouth 2 (two) times daily.   SYMBICORT 160-4.5 MCG/ACT inhaler Generic drug:  budesonide-formoterol Inhale 2 puffs into the lungs 2 (two) times daily.   warfarin 5 MG tablet Commonly known as:  COUMADIN Take 0.5-1 tablets (2.5-5 mg total) by mouth daily. Take 1.25mg  every Sun/Wed then 2.5 mg the rest of the week What changed:  additional instructions        Contact information for follow-up providers    [facility] Follow up.   Specialty:  Gastroenterology Why:  Call or schedule a follow-up appointment as needed.  Contact information: [place] [address] [phone]       [name], MD Follow up in 1 week(s).   Specialty:  Internal Medicine Contact information: [place] [address] [phone]            Contact information for after-discharge care    Destination    [facility] .   Service:  Skilled Nursing Contact information: [place] [address] [phone]                 Allergies  Allergen Reactions  . Lactose Intolerance (Gi) Nausea And Vomiting and Other (See Comments)    severe stomach pain  . Penicillins Hives and Itching    "haven't used any since 1970's" - have gotten cephalosporins multiple times Has patient had a PCN reaction causing immediate rash, facial/tongue/throat swelling, SOB or lightheadedness with hypotension: Yes Has patient had a PCN reaction causing severe rash involving mucus membranes or skin necrosis: Unk Has patient had a PCN reaction that required hospitalization: Unk Has patient had a PCN reaction occurring within the last 10 years: No If all of the above answers are "NO", then may proceed with C  . Sulfa Antibiotics Hives and Itching    "haven't used any since 1970's"  . Ciprofloxacin Swelling    Site of swelling not recalled  . Sulfasalazine Hives and Itching    "haven't used any since 1970's"    Consultations:  GI   Procedures/Studies: Ct Head Wo Contrast  Result Date: 05/16/2018 CLINICAL DATA:  Vision problems while ambulating EXAM: CT HEAD WITHOUT CONTRAST TECHNIQUE: Contiguous axial images were obtained from the base of the skull through the vertex without intravenous contrast. COMPARISON:  01/06/2018 FINDINGS: Brain: Diffuse atrophic  changes are identified. Mild chronic white matter ischemic change is seen. No findings to suggest acute hemorrhage, acute infarction or space-occupying mass lesion are noted. Vascular: No hyperdense vessel or unexpected calcification. Skull: Normal. Negative for fracture or focal lesion. Sinuses/Orbits: No acute finding. Other: None. IMPRESSION: Chronic atrophic and ischemic changes without acute abnormality. Electronically Signed   By: Inez Catalina M.D.   On: 05/16/2018 19:59   Mr Brain Wo Contrast  Result Date: 05/16/2018 CLINICAL DATA:  84 y/o  F; vision problems. EXAM: MRI HEAD WITHOUT CONTRAST TECHNIQUE: Multiplanar, multiecho pulse sequences of the brain and surrounding structures were obtained without intravenous contrast. COMPARISON:  05/16/2018  CT head.  03/05/2004 MRI head. FINDINGS: Brain: No acute infarction, hemorrhage, hydrocephalus, extra-axial collection or mass lesion. Several nonspecific T2 FLAIR hyperintensities in subcortical and periventricular white matter are compatible with mild chronic microvascular ischemic changes for age. Moderate volume loss of the brain. Vascular: Normal flow voids. Skull and upper cervical spine: Normal marrow signal. Sinuses/Orbits: Negative. Other: Bilateral intra-ocular lens replacement. IMPRESSION: 1. No acute intracranial abnormality. 2. Mild for age chronic microvascular ischemic changes and moderate volume loss of the brain. Mild progression from 2005. Electronically Signed   By: Kristine Garbe M.D.   On: 05/16/2018 23:32   Dg Chest Port 1 View  Result Date: 06/01/2018 CLINICAL DATA:  84 y/o  F; syncope and weakness. EXAM: PORTABLE CHEST 1 VIEW COMPARISON:  01/09/2018 chest radiograph FINDINGS: Stable cardiac silhouette within normal limits given projection and technique. Dense mitral annular calcification. Aortic atherosclerosis with calcification. Clear lungs. No pleural effusion or pneumothorax. No acute osseous abnormality is evident. Right  upper quadrant cholecystectomy clips. IMPRESSION: No active disease. Aortic Atherosclerosis (ICD10-I70.0). Electronically Signed   By: Kristine Garbe M.D.   On: 06/01/2018 04:28      Subjective: Patient is feeling better, has been out of bed, no nausea or vomiting, continue to be very weak and deconditioned.   Discharge Exam: Vitals:   06/08/18 0822 06/08/18 0901  BP: 139/75   Pulse: 88   Resp:    Temp:    SpO2:  98%   Vitals:   06/07/18 2132 06/08/18 0504 06/08/18 0822 06/08/18 0901  BP: 103/68 135/81 139/75   Pulse: 99 (!) 106 88   Resp: 16 18    Temp: 98.2 F (36.8 C) (!) 97.2 F (36.2 C)    TempSrc: Oral     SpO2: 96% 98%  98%  Weight:  58.6 kg    Height:        General: Not in pain or dyspnea Neurology: Awake and alert, non focal  E ENT: mild pallor, no icterus, oral mucosa moist Cardiovascular: No JVD. S1-S2 present, rhythmic, no gallops, rubs, or murmurs. No lower extremity edema. Pulmonary: vesicular breath sounds bilaterally, adequate air movement, no wheezing, rhonchi or rales. Gastrointestinal. Abdomen with, no organomegaly, non tender, no rebound or guarding Skin. No rashes Musculoskeletal: no joint deformities   The results of significant diagnostics from this hospitalization (including imaging, microbiology, ancillary and laboratory) are listed below for reference.     Microbiology: Recent Results (from the past 240 hour(s))  Urine Culture     Status: Abnormal   Collection Time: 06/01/18  6:48 AM  Result Value Ref Range Status   Specimen Description URINE, RANDOM  Final   Special Requests   Final    NONE Performed at [facility], [address], [place], [place] [address]    Culture >=100,000 COLONIES/mL ESCHERICHIA COLI (A)  Final   Report Status 06/03/2018 FINAL  Final   Organism ID, Bacteria ESCHERICHIA COLI (A)  Final      Susceptibility   Escherichia coli - MIC*    AMPICILLIN <=2 SENSITIVE Sensitive     CEFAZOLIN <=4  SENSITIVE Sensitive     CEFTRIAXONE <=1 SENSITIVE Sensitive     CIPROFLOXACIN <=0.25 SENSITIVE Sensitive     GENTAMICIN <=1 SENSITIVE Sensitive     IMIPENEM <=0.25 SENSITIVE Sensitive     NITROFURANTOIN <=16 SENSITIVE Sensitive     TRIMETH/SULFA <=20 SENSITIVE Sensitive     AMPICILLIN/SULBACTAM <=2 SENSITIVE Sensitive     PIP/TAZO <=4 SENSITIVE Sensitive  Extended ESBL NEGATIVE Sensitive     * >=100,000 COLONIES/mL ESCHERICHIA COLI  MRSA PCR Screening     Status: None   Collection Time: 06/01/18  7:58 AM  Result Value Ref Range Status   MRSA by PCR NEGATIVE NEGATIVE Final    Comment:        The GeneXpert MRSA Assay (FDA approved for NASAL specimens only), is one component of a comprehensive MRSA colonization surveillance program. It is not intended to diagnose MRSA infection nor to guide or monitor treatment for MRSA infections. Performed at [facility], [place] [address] [place], [place] [address]      Labs: BNP (last 3 results) No results for input(s): BNP in the last 8760 hours. Basic Metabolic Panel: Recent Labs  Lab 06/02/18 0333 06/02/18 1310 06/02/18 1312 06/03/18 0520 06/04/18 0631  NA 140  --  143 139 139  K 3.4*  --  3.1* 3.8 3.4*  CL 112*  --  112* 112* 109  CO2 21*  --  21* 22 19*  GLUCOSE 121*  --  121* 136* 133*  BUN 10  --  8 7* 7*  CREATININE 0.83  --  0.72 0.74 0.73  CALCIUM 7.9*  --  7.4* 8.2* 8.7*  MG  --  1.5*  --   --   --    Liver Function Tests: Recent Labs  Lab 06/02/18 0333  AST 18  ALT 12  ALKPHOS 59  BILITOT 1.1  PROT 5.0*  ALBUMIN 2.6*   No results for input(s): LIPASE, AMYLASE in the last 168 hours. No results for input(s): AMMONIA in the last 168 hours. CBC: Recent Labs  Lab 06/02/18 0838 06/02/18 1310 06/04/18 0631 06/05/18 0412 06/06/18 0417 06/07/18 0510  WBC 11.6* 11.9* 11.4* 12.0* 13.1* 12.4*  NEUTROABS 8.0* 8.9* 7.4  --  9.0* 8.3*  HGB 12.4 12.2 12.6 13.1 13.1 12.7  HCT 38.0 37.1 37.5 39.1 39.4 37.2   MCV 85.6 86.1 86.2 86.5 87.4 85.7  PLT 153 153 212 221 226 224   Cardiac Enzymes: No results for input(s): CKTOTAL, CKMB, CKMBINDEX, TROPONINI in the last 168 hours. BNP: Invalid input(s): POCBNP CBG: Recent Labs  Lab 06/07/18 1640 06/07/18 2004 06/08/18 0017 06/08/18 0501 06/08/18 0758  GLUCAP 180* 165* 140* 138* 142*   D-Dimer No results for input(s): DDIMER in the last 72 hours. Hgb A1c No results for input(s): HGBA1C in the last 72 hours. Lipid Profile No results for input(s): CHOL, HDL, LDLCALC, TRIG, CHOLHDL, LDLDIRECT in the last 72 hours. Thyroid function studies No results for input(s): TSH, T4TOTAL, T3FREE, THYROIDAB in the last 72 hours.  Invalid input(s): FREET3 Anemia work up No results for input(s): VITAMINB12, FOLATE, FERRITIN, TIBC, IRON, RETICCTPCT in the last 72 hours. Urinalysis    Component Value Date/Time   COLORURINE YELLOW 06/01/2018 [place] 06/01/2018 0647   LABSPEC 1.006 06/01/2018 0647   PHURINE 6.0 06/01/2018 0647   GLUCOSEU NEGATIVE 06/01/2018 0647   HGBUR MODERATE (A) 06/01/2018 0647   BILIRUBINUR NEGATIVE 06/01/2018 0647   KETONESUR NEGATIVE 06/01/2018 0647   PROTEINUR NEGATIVE 06/01/2018 0647   UROBILINOGEN 0.2 11/23/2013 1431   NITRITE POSITIVE (A) 06/01/2018 0647   LEUKOCYTESUR TRACE (A) 06/01/2018 0647   Sepsis Labs Invalid input(s): PROCALCITONIN,  WBC,  LACTICIDVEN Microbiology Recent Results (from the past 240 hour(s))  Urine Culture     Status: Abnormal   Collection Time: 06/01/18  6:48 AM  Result Value Ref Range Status   Specimen  Description URINE, RANDOM  Final   Special Requests   Final    NONE Performed at [facility], [place] [address] [place], [place] [address]    Culture >=100,000 COLONIES/mL ESCHERICHIA COLI (A)  Final   Report Status 06/03/2018 FINAL  Final   Organism ID, Bacteria ESCHERICHIA COLI (A)  Final      Susceptibility   Escherichia coli - MIC*    AMPICILLIN <=2 SENSITIVE  Sensitive     CEFAZOLIN <=4 SENSITIVE Sensitive     CEFTRIAXONE <=1 SENSITIVE Sensitive     CIPROFLOXACIN <=0.25 SENSITIVE Sensitive     GENTAMICIN <=1 SENSITIVE Sensitive     IMIPENEM <=0.25 SENSITIVE Sensitive     NITROFURANTOIN <=16 SENSITIVE Sensitive     TRIMETH/SULFA <=20 SENSITIVE Sensitive     AMPICILLIN/SULBACTAM <=2 SENSITIVE Sensitive     PIP/TAZO <=4 SENSITIVE Sensitive     Extended ESBL NEGATIVE Sensitive     * >=100,000 COLONIES/mL ESCHERICHIA COLI  MRSA PCR Screening     Status: None   Collection Time: 06/01/18  7:58 AM  Result Value Ref Range Status   MRSA by PCR NEGATIVE NEGATIVE Final    Comment:        The GeneXpert MRSA Assay (FDA approved for NASAL specimens only), is one component of a comprehensive MRSA colonization surveillance program. It is not intended to diagnose MRSA infection nor to guide or monitor treatment for MRSA infections. Performed at [facility], [place] [address], [place],  [address]      Time coordinating discharge: 45 minutes  SIGNED:   Reyne Dumas, MD  [facility] 06/08/2018, 10:42 AM Pager [phone]  If 7PM-7AM, please contact night-coverage [email or web address] Password [email or web address]

## 2018-06-08 NOTE — Plan of Care (Signed)
POC reviewed with patient. No acute events throughout shift. Pt expected to d/c home today.

## 2018-06-08 NOTE — Progress Notes (Signed)
[facility] for Warfarin Indication: atrial fibrillation  Allergies  Allergen Reactions  . Lactose Intolerance (Gi) Nausea And Vomiting and Other (See Comments)    severe stomach pain  . Penicillins Hives and Itching    "haven't used any since 1970's" - have gotten cephalosporins multiple times Has patient had a PCN reaction causing immediate rash, facial/tongue/throat swelling, SOB or lightheadedness with hypotension: Yes Has patient had a PCN reaction causing severe rash involving mucus membranes or skin necrosis: Unk Has patient had a PCN reaction that required hospitalization: Unk Has patient had a PCN reaction occurring within the last 10 years: No If all of the above answers are "NO", then may proceed with C  . Sulfa Antibiotics Hives and Itching    "haven't used any since 1970's"  . Ciprofloxacin Swelling    Site of swelling not recalled  . Sulfasalazine Hives and Itching    "haven't used any since 1970's"    Patient Measurements: Height: 5' (152.4 cm) Weight: 129 lb 3 oz (58.6 kg) IBW/kg (Calculated) : 45.5  Vital Signs: Temp: 97.2 F (36.2 C) (09/04 0504) Temp Source: Oral (09/03 2132) BP: 135/81 (09/04 0504) Pulse Rate: 106 (09/04 0504)  Labs: Recent Labs    06/06/18 0417 06/07/18 0510 06/08/18 0408  HGB 13.1 12.7  --   HCT 39.4 37.2  --   PLT 226 224  --   LABPROT 14.5 17.8* 19.1*  INR 1.14 1.48 1.62    Estimated Creatinine Clearance: 40.4 mL/min (by C-G formula based on SCr of 0.73 mg/dL).   Assessment: Patient is an 84 yo F on Coumadin 2.5mg  daily exc 1.25mg  on Wed/Sun PTA for AFib on hold 2/2 to GIB. Given Kcentra and Vit K on admit.  Now Coumadin resumed 8/30. CHADS-VASc 4. INR trending up to 1.62. CBC stable  Goal of Therapy:  INR 2-3 Monitor platelets by anticoagulation protocol: Yes   Plan:  Coumadin 2.5mg  PO x 1  Monitor daily INR, CBC, s/s of bleed  Elenor Quinones, PharmD, BCPS Clinical  Pharmacist Phone number [phone] 06/08/2018 7:46 AM

## 2018-06-09 DIAGNOSIS — M6281 Muscle weakness (generalized): Secondary | ICD-10-CM | POA: Diagnosis not present

## 2018-06-09 DIAGNOSIS — R0781 Pleurodynia: Secondary | ICD-10-CM | POA: Diagnosis not present

## 2018-06-09 DIAGNOSIS — G4700 Insomnia, unspecified: Secondary | ICD-10-CM | POA: Diagnosis not present

## 2018-06-09 DIAGNOSIS — F419 Anxiety disorder, unspecified: Secondary | ICD-10-CM | POA: Diagnosis not present

## 2018-06-09 DIAGNOSIS — R197 Diarrhea, unspecified: Secondary | ICD-10-CM | POA: Diagnosis not present

## 2018-06-09 DIAGNOSIS — I482 Chronic atrial fibrillation: Secondary | ICD-10-CM | POA: Diagnosis not present

## 2018-06-09 DIAGNOSIS — E119 Type 2 diabetes mellitus without complications: Secondary | ICD-10-CM | POA: Diagnosis not present

## 2018-06-09 DIAGNOSIS — R062 Wheezing: Secondary | ICD-10-CM | POA: Diagnosis not present

## 2018-06-09 DIAGNOSIS — R488 Other symbolic dysfunctions: Secondary | ICD-10-CM | POA: Diagnosis not present

## 2018-06-09 DIAGNOSIS — D649 Anemia, unspecified: Secondary | ICD-10-CM | POA: Diagnosis not present

## 2018-06-09 DIAGNOSIS — E876 Hypokalemia: Secondary | ICD-10-CM | POA: Diagnosis not present

## 2018-06-09 DIAGNOSIS — K5731 Diverticulosis of large intestine without perforation or abscess with bleeding: Secondary | ICD-10-CM | POA: Diagnosis not present

## 2018-06-09 DIAGNOSIS — Z7901 Long term (current) use of anticoagulants: Secondary | ICD-10-CM | POA: Diagnosis not present

## 2018-06-09 DIAGNOSIS — R110 Nausea: Secondary | ICD-10-CM | POA: Diagnosis not present

## 2018-06-09 DIAGNOSIS — M549 Dorsalgia, unspecified: Secondary | ICD-10-CM | POA: Diagnosis not present

## 2018-06-09 DIAGNOSIS — I4891 Unspecified atrial fibrillation: Secondary | ICD-10-CM | POA: Diagnosis not present

## 2018-06-09 DIAGNOSIS — J449 Chronic obstructive pulmonary disease, unspecified: Secondary | ICD-10-CM | POA: Diagnosis not present

## 2018-06-09 DIAGNOSIS — K649 Unspecified hemorrhoids: Secondary | ICD-10-CM | POA: Diagnosis not present

## 2018-06-09 DIAGNOSIS — K922 Gastrointestinal hemorrhage, unspecified: Secondary | ICD-10-CM | POA: Diagnosis not present

## 2018-06-09 DIAGNOSIS — D72829 Elevated white blood cell count, unspecified: Secondary | ICD-10-CM | POA: Diagnosis not present

## 2018-06-09 DIAGNOSIS — J189 Pneumonia, unspecified organism: Secondary | ICD-10-CM | POA: Diagnosis not present

## 2018-06-09 DIAGNOSIS — R2689 Other abnormalities of gait and mobility: Secondary | ICD-10-CM | POA: Diagnosis not present

## 2018-06-09 DIAGNOSIS — R498 Other voice and resonance disorders: Secondary | ICD-10-CM | POA: Diagnosis not present

## 2018-06-09 DIAGNOSIS — I480 Paroxysmal atrial fibrillation: Secondary | ICD-10-CM | POA: Diagnosis not present

## 2018-06-09 LAB — CBC
HCT: 39.3 % (ref 36.0–46.0)
HEMOGLOBIN: 12.9 g/dL (ref 12.0–15.0)
MCH: 28.6 pg (ref 26.0–34.0)
MCHC: 32.8 g/dL (ref 30.0–36.0)
MCV: 87.1 fL (ref 78.0–100.0)
PLATELETS: 319 10*3/uL (ref 150–400)
RBC: 4.51 MIL/uL (ref 3.87–5.11)
RDW: 15.5 % (ref 11.5–15.5)
WBC: 10.6 10*3/uL — ABNORMAL HIGH (ref 4.0–10.5)

## 2018-06-09 LAB — GLUCOSE, CAPILLARY
GLUCOSE-CAPILLARY: 153 mg/dL — AB (ref 70–99)
GLUCOSE-CAPILLARY: 156 mg/dL — AB (ref 70–99)

## 2018-06-09 LAB — PROTIME-INR
INR: 1.7
Prothrombin Time: 19.8 seconds — ABNORMAL HIGH (ref 11.4–15.2)

## 2018-06-09 MED ORDER — WARFARIN SODIUM 3 MG PO TABS
3.0000 mg | ORAL_TABLET | Freq: Once | ORAL | Status: DC
  Filled 2018-06-09: qty 1

## 2018-06-09 NOTE — Progress Notes (Signed)
Patient will DC to: [facility] date: 06/09/18 Family notified: Spouse at bedside Transport by: Spouse by car   Per MD patient ready for DC to [facility]. RN, patient, patient's family, and facility notified of DC. Discharge Summary sent to facility. RN given number for report [phone] Room 509P).   CSW signing off.  Cedric Fishman, LCSW Clinical Social Worker [phone]

## 2018-06-09 NOTE — Clinical Social Work Placement (Signed)
   CLINICAL SOCIAL WORK PLACEMENT  NOTE  Date:  06/09/2018  Patient Details  Name: [name] [name] MRN: [id number] Date of Birth: 05-12-1930  Clinical Social Work is seeking post-discharge placement for this patient at the [place] level of care (*CSW will initial, date and re-position this form in  chart as items are completed):  Yes   Patient/family provided with [facility] Work Department's list of facilities offering this level of care within the geographic area requested by the patient (or if unable, by the patient's family).  Yes   Patient/family informed of their freedom to choose among providers that offer the needed level of care, that participate in Medicare, Medicaid or managed care program needed by the patient, have an available bed and are willing to accept the patient.  Yes   Patient/family informed of [facility]'s ownership interest in [facility] and [facility], as well as of the fact that they are under no obligation to receive care at these facilities.  PASRR submitted to EDS on       PASRR number received on       Existing PASRR number confirmed on 06/04/18     FL2 transmitted to all facilities in geographic area requested by pt/family on 06/04/18     FL2 transmitted to all facilities within larger geographic area on       Patient informed that his/her managed care company has contracts with or will negotiate with certain facilities, including the following:        Yes   Patient/family informed of bed offers received.  Patient chooses bed at [facility]     Physician recommends and patient chooses bed at      Patient to be transferred to [facility] on 06/09/18.  Patient to be transferred to facility by [facility]     Patient family notified on 06/09/18 of transfer.  Name of family member notified:  Spouse at bedside     PHYSICIAN Please sign FL2     Additional Comment:     _______________________________________________ Benard Halsted, [place] 06/09/2018, 10:59 AM

## 2018-06-09 NOTE — Progress Notes (Signed)
[place] for Warfarin Indication: atrial fibrillation  Allergies  Allergen Reactions  . Lactose Intolerance (Gi) Nausea And Vomiting and Other (See Comments)    severe stomach pain  . Penicillins Hives and Itching    "haven't used any since 1970's" - have gotten cephalosporins multiple times Has patient had a PCN reaction causing immediate rash, facial/tongue/throat swelling, SOB or lightheadedness with hypotension: Yes Has patient had a PCN reaction causing severe rash involving mucus membranes or skin necrosis: Unk Has patient had a PCN reaction that required hospitalization: Unk Has patient had a PCN reaction occurring within the last 10 years: No If all of the above answers are "NO", then may proceed with C  . Sulfa Antibiotics Hives and Itching    "haven't used any since 1970's"  . Ciprofloxacin Swelling    Site of swelling not recalled  . Sulfasalazine Hives and Itching    "haven't used any since 1970's"    Patient Measurements: Height: 5' (152.4 cm) Weight: 129 lb 3 oz (58.6 kg) IBW/kg (Calculated) : 45.5  Vital Signs: Temp: 97.7 F (36.5 C) (09/05 0455) Temp Source: Oral (09/05 0455) BP: 110/87 (09/05 0455) Pulse Rate: 102 (09/05 0847)  Labs: Recent Labs    06/07/18 0510 06/08/18 0408 06/09/18 0346 06/09/18 0831  HGB 12.7  --   --  12.9  HCT 37.2  --   --  39.3  PLT 224  --   --  319  LABPROT 17.8* 19.1* 19.8*  --   INR 1.48 1.62 1.70  --     Estimated Creatinine Clearance: 40.4 mL/min (by C-G formula based on SCr of 0.73 mg/dL).   Assessment: Patient is an 84 yo F on Coumadin 2.5mg  daily exc 1.25mg  on Wed/Sun PTA for AFib on hold d/t GIB. Given Kcentra and Vit K on admit.  Now Coumadin resumed 8/30. CHADS-VASc 4. INR trending up to 1.7 today. CBC stable. PO intake variable, also receiving Glucerna shakes. No bleeding noted by RN.  Goal of Therapy:  INR 2-3 Monitor platelets by anticoagulation protocol: Yes    Plan:  Give warfarin 3 mg po x 1 Monitor daily INR, CBC, clinical course, s/sx of bleed, PO intake, DDI   Thank you for allowing [place] to participate in this patients care.   Jens Som, PharmD Please utilize Amion (under [facility]) for appropriate number for your unit pharmacist. 06/09/2018 10:42 AM

## 2018-06-09 NOTE — Discharge Summary (Signed)
Physician Discharge Summary  Malie Kashani HCW:[id number] DOB: [birth year] DOA: 06/01/2018  PCP: [name], MD  Admit date: 06/01/2018 Discharge date: 06/09/2018  Admitted From: Home  Disposition:  SNF   Recommendations for Outpatient Follow-up and new medication changes:  1. Follow up with Dr. Philip Aspen in 7 days 2. Please obtain INR in 48 hours, target INR 2 to 3.  3. Follow-up with gastroenterology 4. CBC, BMP weekly  Home Health: na   Equipment/Devices: na   Discharge Condition: stable  CODE STATUS: full  Diet recommendation: Heart healthy and diabetic prudent   Brief/Interim Summary: 84 year old female who presented with rectal bleeding and syncope. She does have significant past medical history for chronic atrial fibrillation, anxiety, type 2 diabetes mellitus and COPD. Patient reported 2 episodes of bright red blood per rectum.Orthostatic symptoms after standing up from the commode and experienced a syncope episode. Physical examination blood pressure 124/61, heart rate 88, respiratory rate 23, oxygen saturation 98%.Patient was pale, moist mucous membranes, lungs clear to auscultation bilaterally, heart S1-S2 present, irregularly irregular, abdomen soft nontender, nondistended, no lower extremity edema.  Sodium136, potassium 3.7, chloride 102, bicarb 31, glucose 26, BUN 13, creatinine 0.97, white count 19.4, hemoglobin 5.2, hematocrit 16.3, platelets 358, urinalysis negative for infection. Chest x-ray with right rotation, right hemidiaphragm elevation. EKG with atrial fibrillation rhythm.  Patient was admitted to the hospital with working diagnosis of lower GI bleed complicated bysymptomatic acute blood loss anemia andsyncope.  1.  Symptomatic acute blood loss anemia due to lower gastrointestinal bleeding/ diverticular bleeding. Sp 4 units prbc transfusion.  Patient was admitted to the stepdown unit, she was placed on a remote telemetry monitor, patient received  2 units of packed red blood cells transfusion on admission.  Patient had recurrent profuse bleeding, associated with altered mentation and bradycardia.  Patient was transferred to the intensive care unit, received 2 more units of PRBC transfusion and vitamin K to revert warfarin induced coagulopathy.  Further work-up with colonoscopy showed diverticulosis in sigmoid colon and in the distal descending colon.  Her hemoglobin and hematocrit at discharge was 12.7 and 37.2.  Warfarin has been resumed.  2.  Chronic atrial fibrillation.  Controlled with diltiazem, anticoagulation has been resumed for cerebrovascular accident prophylaxis.  Discharge INR is 1,48.  Patient will resume metoprolol at discharge 25 mg twice  3.  Acute on chronic diarrhea.  Patient was placed back on loperamide with improvement of her symptoms,  4.  COPD.  Has remained stable with no signs of exacerbation. Continue Symbicort.   5.  Type 2 diabetes mellitus.  Capillary glucose remained stable, he was placed on insulin sliding scale.  Resume glimepiride and Lantus 22 units at discharge  6.  Anxiety.  No confusion or agitation.  Continue as needed lorazepam.  Resumed divalproex and mirtazapine at discharge.  Discharge Diagnoses:  Principal Problem:   Symptomatic anemia Active Problems:   Leukocytosis   Atrial fibrillation    Coagulopathy (HCC)   Acute GI bleeding   COPD (chronic obstructive pulmonary disease) (HCC)   DM neuropathy, type II diabetes mellitus ([place])   Rectal bleeding   Hematochezia   Shock ([place])   Diverticulosis of colon without hemorrhage   Hemorrhoids    Discharge Instructions  Discharge Instructions    Diet - low sodium heart healthy   Complete by:  As directed    Discharge instructions   Complete by:  As directed    Please follow with primary care in 7 days.   Increase activity  slowly   Complete by:  As directed      Allergies as of 06/09/2018      Reactions   Lactose Intolerance (gi) Nausea  And Vomiting, Other (See Comments)   severe stomach pain   Penicillins Hives, Itching   "haven't used any since 1970's" - have gotten cephalosporins multiple times Has patient had a PCN reaction causing immediate rash, facial/tongue/throat swelling, SOB or lightheadedness with hypotension: Yes Has patient had a PCN reaction causing severe rash involving mucus membranes or skin necrosis: Unk Has patient had a PCN reaction that required hospitalization: Unk Has patient had a PCN reaction occurring within the last 10 years: No If all of the above answers are "NO", then may proceed with C   Sulfa Antibiotics Hives, Itching   "haven't used any since 1970's"   Ciprofloxacin Swelling   Site of swelling not recalled   Sulfasalazine Hives, Itching   "haven't used any since 1970's"      Medication List    STOP taking these medications   HYDROcodone-acetaminophen 5-325 MG tablet Commonly known as:  NORCO/VICODIN   omeprazole 20 MG capsule Commonly known as:  PRILOSEC     TAKE these medications   diclofenac sodium 1 % Gel Commonly known as:  VOLTAREN Apply 2 g topically 4 (four) times daily. Apply to right ankle.   diltiazem 120 MG 24 hr capsule Commonly known as:  DILACOR XR TAKE 1 CAPSULE(120 MG) BY MOUTH DAILY What changed:    how much to take  how to take this  when to take this   divalproex 125 MG capsule Commonly known as:  DEPAKOTE SPRINKLE Take 125 mg by mouth 2 (two) times daily.   EQ RESTORE PLUS LUBRICANT EYE 0.5 % Soln Generic drug:  Carboxymethylcellulose Sod PF Apply 1 drop to eye daily as needed.   gabapentin 300 MG capsule Commonly known as:  NEURONTIN Take 300 mg by mouth daily.   glimepiride 4 MG tablet Commonly known as:  AMARYL Take 4 mg by mouth daily with breakfast.   insulin aspart 100 UNIT/ML FlexPen Commonly known as:  NOVOLOG Correction coverage: Sensitive (thin, NPO, renal)  CBG < 70: implement hypoglycemia protocol  CBG 70 - 120: 0  units  CBG 121 - 150: 1 unit  CBG 151 - 200: 2 units  CBG 201 - 250: 3 units  CBG 251 - 300: 5 units  CBG 301 - 350: 7 units  CBG 351 - 400 9 units  CBG > 400 call MD and obtain STAT lab verification   lactose free nutrition Liqd Take 237 mLs by mouth as needed.   LANTUS SOLOSTAR 100 UNIT/ML Solostar Pen Generic drug:  Insulin Glargine Inject 22 Units into the skin daily.   latanoprost 0.005 % ophthalmic solution Commonly known as:  XALATAN Place 1 drop into both eyes at bedtime.   loperamide 2 MG tablet Commonly known as:  IMODIUM A-D Take 1 tablet (2 mg total) by mouth as needed for diarrhea or loose stools (use at bedtime). What changed:  when to take this   LORazepam 1 MG tablet Commonly known as:  ATIVAN Take 1 tablet (1 mg total) by mouth every 8 (eight) hours as needed.   methylphenidate 5 MG tablet Commonly known as:  RITALIN Take 1 tablet (5 mg total) by mouth See admin instructions. take 5mg  in the morning daily and then 5mg  twice a day on every other day What changed:  when to take this   metoprolol  tartrate 25 MG tablet Commonly known as:  LOPRESSOR TAKE 1 TABLET(25 MG) BY MOUTH TWICE DAILY WITH FOOD What changed:  See the new instructions.   mirtazapine 7.5 MG tablet Commonly known as:  REMERON TAKE 1 TABLET(7.5 MG) BY MOUTH AT BEDTIME What changed:  See the new instructions.   multivitamin with minerals Tabs tablet Take 1 tablet by mouth daily.   oxybutynin 5 MG tablet Commonly known as:  DITROPAN Take 1 tablet by mouth at bedtime.   pantoprazole 40 MG tablet Commonly known as:  PROTONIX Take 1 tablet (40 mg total) by mouth 2 (two) times daily.   promethazine 25 MG tablet Commonly known as:  PHENERGAN Take 25 mg by mouth 3 (three) times daily as needed for nausea or vomiting.   sucralfate 1 g tablet Commonly known as:  CARAFATE Take 1 g by mouth 2 (two) times daily.   SYMBICORT 160-4.5 MCG/ACT inhaler Generic drug:   budesonide-formoterol Inhale 2 puffs into the lungs 2 (two) times daily.   warfarin 5 MG tablet Commonly known as:  COUMADIN Take 0.5-1 tablets (2.5-5 mg total) by mouth daily. Take 1.25mg  every Sun/Wed then 2.5 mg the rest of the week What changed:  additional instructions       Contact information for follow-up providers    [facility] Follow up.   Specialty:  Gastroenterology Why:  Call or schedule a follow-up appointment as needed.  Contact information: [place] [id number] [phone]       [name], MD Follow up in 1 week(s).   Specialty:  Internal Medicine Contact information: [place] [address] [phone]            Contact information for after-discharge care    Destination    [facility] .   Service:  Skilled Nursing Contact information: [place] [address] [phone]                 Allergies  Allergen Reactions  . Lactose Intolerance (Gi) Nausea And Vomiting and Other (See Comments)    severe stomach pain  . Penicillins Hives and Itching    "haven't used any since 1970's" - have gotten cephalosporins multiple times Has patient had a PCN reaction causing immediate rash, facial/tongue/throat swelling, SOB or lightheadedness with hypotension: Yes Has patient had a PCN reaction causing severe rash involving mucus membranes or skin necrosis: Unk Has patient had a PCN reaction that required hospitalization: Unk Has patient had a PCN reaction occurring within the last 10 years: No If all of the above answers are "NO", then may proceed with C  . Sulfa Antibiotics Hives and Itching    "haven't used any since 1970's"  . Ciprofloxacin Swelling    Site of swelling not recalled  . Sulfasalazine Hives and Itching    "haven't used any since 1970's"    Consultations:  GI   Procedures/Studies: Ct Head Wo Contrast  Result  Date: 05/16/2018 CLINICAL DATA:  Vision problems while ambulating EXAM: CT HEAD WITHOUT CONTRAST TECHNIQUE: Contiguous axial images were obtained from the base of the skull through the vertex without intravenous contrast. COMPARISON:  01/06/2018 FINDINGS: Brain: Diffuse atrophic changes are identified. Mild chronic white matter ischemic change is seen. No findings to suggest acute hemorrhage, acute infarction or space-occupying mass lesion are noted. Vascular: No hyperdense vessel or unexpected calcification. Skull: Normal. Negative for fracture or focal lesion. Sinuses/Orbits: No acute finding. Other: None. IMPRESSION: Chronic atrophic  and ischemic changes without acute abnormality. Electronically Signed   By: Inez Catalina M.D.   On: 05/16/2018 19:59   Mr Brain Wo Contrast  Result Date: 05/16/2018 CLINICAL DATA:  84 y/o  F; vision problems. EXAM: MRI HEAD WITHOUT CONTRAST TECHNIQUE: Multiplanar, multiecho pulse sequences of the brain and surrounding structures were obtained without intravenous contrast. COMPARISON:  05/16/2018 CT head.  03/05/2004 MRI head. FINDINGS: Brain: No acute infarction, hemorrhage, hydrocephalus, extra-axial collection or mass lesion. Several nonspecific T2 FLAIR hyperintensities in subcortical and periventricular white matter are compatible with mild chronic microvascular ischemic changes for age. Moderate volume loss of the brain. Vascular: Normal flow voids. Skull and upper cervical spine: Normal marrow signal. Sinuses/Orbits: Negative. Other: Bilateral intra-ocular lens replacement. IMPRESSION: 1. No acute intracranial abnormality. 2. Mild for age chronic microvascular ischemic changes and moderate volume loss of the brain. Mild progression from 2005. Electronically Signed   By: Kristine Garbe M.D.   On: 05/16/2018 23:32   Dg Chest Port 1 View  Result Date: 06/01/2018 CLINICAL DATA:  84 y/o  F; syncope and weakness. EXAM: PORTABLE CHEST 1 VIEW COMPARISON:  01/09/2018  chest radiograph FINDINGS: Stable cardiac silhouette within normal limits given projection and technique. Dense mitral annular calcification. Aortic atherosclerosis with calcification. Clear lungs. No pleural effusion or pneumothorax. No acute osseous abnormality is evident. Right upper quadrant cholecystectomy clips. IMPRESSION: No active disease. Aortic Atherosclerosis (ICD10-I70.0). Electronically Signed   By: Kristine Garbe M.D.   On: 06/01/2018 04:28      Subjective: Patient is feeling better, has been out of bed, no nausea or vomiting, continue to be very weak and deconditioned.   Discharge Exam: Vitals:   06/09/18 0455 06/09/18 0847  BP: 110/87   Pulse: 95 (!) 102  Resp: 20 20  Temp: 97.7 F (36.5 C)   SpO2: 99% 99%   Vitals:   06/08/18 1930 06/08/18 2029 06/09/18 0455 06/09/18 0847  BP:  (!) 145/65 110/87   Pulse:  92 95 (!) 102  Resp:  18 20 20   Temp:  97.7 F (36.5 C) 97.7 F (36.5 C)   TempSrc:  Oral Oral   SpO2: 96% 96% 99% 99%  Weight:      Height:        General: Not in pain or dyspnea Neurology: Awake and alert, non focal  E ENT: mild pallor, no icterus, oral mucosa moist Cardiovascular: No JVD. S1-S2 present, rhythmic, no gallops, rubs, or murmurs. No lower extremity edema. Pulmonary: vesicular breath sounds bilaterally, adequate air movement, no wheezing, rhonchi or rales. Gastrointestinal. Abdomen with, no organomegaly, non tender, no rebound or guarding Skin. No rashes Musculoskeletal: no joint deformities   The results of significant diagnostics from this hospitalization (including imaging, microbiology, ancillary and laboratory) are listed below for reference.     Microbiology: Recent Results (from the past 240 hour(s))  Urine Culture     Status: Abnormal   Collection Time: 06/01/18  6:48 AM  Result Value Ref Range Status   Specimen Description URINE, RANDOM  Final   Special Requests   Final    NONE Performed at [facility], [address], [place], [place] [address]    Culture >=100,000 COLONIES/mL ESCHERICHIA COLI (A)  Final   Report Status 06/03/2018 FINAL  Final   Organism ID, Bacteria ESCHERICHIA COLI (A)  Final      Susceptibility   Escherichia coli - MIC*    AMPICILLIN <=2 SENSITIVE Sensitive     CEFAZOLIN <=4  SENSITIVE Sensitive     CEFTRIAXONE <=1 SENSITIVE Sensitive     CIPROFLOXACIN <=0.25 SENSITIVE Sensitive     GENTAMICIN <=1 SENSITIVE Sensitive     IMIPENEM <=0.25 SENSITIVE Sensitive     NITROFURANTOIN <=16 SENSITIVE Sensitive     TRIMETH/SULFA <=20 SENSITIVE Sensitive     AMPICILLIN/SULBACTAM <=2 SENSITIVE Sensitive     PIP/TAZO <=4 SENSITIVE Sensitive     Extended ESBL NEGATIVE Sensitive     * >=100,000 COLONIES/mL ESCHERICHIA COLI  MRSA PCR Screening     Status: None   Collection Time: 06/01/18  7:58 AM  Result Value Ref Range Status   MRSA by PCR NEGATIVE NEGATIVE Final    Comment:        The GeneXpert MRSA Assay (FDA approved for NASAL specimens only), is one component of a comprehensive MRSA colonization surveillance program. It is not intended to diagnose MRSA infection nor to guide or monitor treatment for MRSA infections. Performed at [facility], [place] [address] [place], [place] [address]      Labs: BNP (last 3 results) No results for input(s): BNP in the last 8760 hours. Basic Metabolic Panel: Recent Labs  Lab 06/02/18 1310 06/02/18 1312 06/03/18 0520 06/04/18 0631  NA  --  143 139 139  K  --  3.1* 3.8 3.4*  CL  --  112* 112* 109  CO2  --  21* 22 19*  GLUCOSE  --  121* 136* 133*  BUN  --  8 7* 7*  CREATININE  --  0.72 0.74 0.73  CALCIUM  --  7.4* 8.2* 8.7*  MG 1.5*  --   --   --    Liver Function Tests: No results for input(s): AST, ALT, ALKPHOS, BILITOT, PROT, ALBUMIN in the last 168 hours. No results for input(s): LIPASE, AMYLASE in the last 168 hours. No results for input(s): AMMONIA in the last 168 hours. CBC: Recent Labs  Lab  06/02/18 1310 06/04/18 0631 06/05/18 0412 06/06/18 0417 06/07/18 0510 06/09/18 0831  WBC 11.9* 11.4* 12.0* 13.1* 12.4* 10.6*  NEUTROABS 8.9* 7.4  --  9.0* 8.3*  --   HGB 12.2 12.6 13.1 13.1 12.7 12.9  HCT 37.1 37.5 39.1 39.4 37.2 39.3  MCV 86.1 86.2 86.5 87.4 85.7 87.1  PLT 153 212 221 226 224 319   Cardiac Enzymes: No results for input(s): CKTOTAL, CKMB, CKMBINDEX, TROPONINI in the last 168 hours. BNP: Invalid input(s): POCBNP CBG: Recent Labs  Lab 06/08/18 1205 06/08/18 1640 06/08/18 2030 06/09/18 0355 06/09/18 0750  GLUCAP 186* 139* 216* 153* 156*   D-Dimer No results for input(s): DDIMER in the last 72 hours. Hgb A1c No results for input(s): HGBA1C in the last 72 hours. Lipid Profile No results for input(s): CHOL, HDL, LDLCALC, TRIG, CHOLHDL, LDLDIRECT in the last 72 hours. Thyroid function studies No results for input(s): TSH, T4TOTAL, T3FREE, THYROIDAB in the last 72 hours.  Invalid input(s): FREET3 Anemia work up No results for input(s): VITAMINB12, FOLATE, FERRITIN, TIBC, IRON, RETICCTPCT in the last 72 hours. Urinalysis    Component Value Date/Time   COLORURINE YELLOW 06/01/2018 [place] 06/01/2018 0647   LABSPEC 1.006 06/01/2018 0647   PHURINE 6.0 06/01/2018 0647   GLUCOSEU NEGATIVE 06/01/2018 0647   HGBUR MODERATE (A) 06/01/2018 0647   BILIRUBINUR NEGATIVE 06/01/2018 0647   KETONESUR NEGATIVE 06/01/2018 0647   PROTEINUR NEGATIVE 06/01/2018 0647   UROBILINOGEN 0.2 11/23/2013 1431   NITRITE POSITIVE (A) 06/01/2018 0647   LEUKOCYTESUR TRACE (  A) 06/01/2018 0647   Sepsis Labs Invalid input(s): PROCALCITONIN,  WBC,  LACTICIDVEN Microbiology Recent Results (from the past 240 hour(s))  Urine Culture     Status: Abnormal   Collection Time: 06/01/18  6:48 AM  Result Value Ref Range Status   Specimen Description URINE, RANDOM  Final   Special Requests   Final    NONE Performed at [facility], [address], [place],  [place] [address]    Culture >=100,000 COLONIES/mL ESCHERICHIA COLI (A)  Final   Report Status 06/03/2018 FINAL  Final   Organism ID, Bacteria ESCHERICHIA COLI (A)  Final      Susceptibility   Escherichia coli - MIC*    AMPICILLIN <=2 SENSITIVE Sensitive     CEFAZOLIN <=4 SENSITIVE Sensitive     CEFTRIAXONE <=1 SENSITIVE Sensitive     CIPROFLOXACIN <=0.25 SENSITIVE Sensitive     GENTAMICIN <=1 SENSITIVE Sensitive     IMIPENEM <=0.25 SENSITIVE Sensitive     NITROFURANTOIN <=16 SENSITIVE Sensitive     TRIMETH/SULFA <=20 SENSITIVE Sensitive     AMPICILLIN/SULBACTAM <=2 SENSITIVE Sensitive     PIP/TAZO <=4 SENSITIVE Sensitive     Extended ESBL NEGATIVE Sensitive     * >=100,000 COLONIES/mL ESCHERICHIA COLI  MRSA PCR Screening     Status: None   Collection Time: 06/01/18  7:58 AM  Result Value Ref Range Status   MRSA by PCR NEGATIVE NEGATIVE Final    Comment:        The GeneXpert MRSA Assay (FDA approved for NASAL specimens only), is one component of a comprehensive MRSA colonization surveillance program. It is not intended to diagnose MRSA infection nor to guide or monitor treatment for MRSA infections. Performed at [facility], [place] [address], [place], [place] [address]      Time coordinating discharge: 45 minutes  SIGNED:   Reyne Dumas, MD  [facility] 06/09/2018, 10:57 AM Pager [phone]  If 7PM-7AM, please contact night-coverage [email or web address] Password [email or web address]

## 2018-06-09 NOTE — Progress Notes (Signed)
Physical Therapy Treatment Patient Details Name: [name] [name] MRN: [id number] DOB: 1930/07/01 Today's Date: 06/09/2018    History of Present Illness 84 yo female with hx of chronic afib on coumadin, anxiety,TIIDM, and COPD admitted 8/28 for a GI bleed after presenting to the ED with syncope, witnessed rectal bleeding & a Hb of 5.2. She was fluid resuscitated, Hb repleted to 14. Colonoscopy performed 06/02/18 Clinical presentation most consistent with self-limiting acute diverticular bleed.     PT Comments    Patient motivated to work with PT. Patient today performing bed mobility at Mod I level, however does require Min guard for transfers and mobility with patient limited by fatigue and general weakness. Labored breathing throughout mobility, however SpO2 on RA 98% with HR 89 bpm. Making good progress towards goals. Will continue to recommend SNF.     Follow Up Recommendations  SNF     Equipment Recommendations  None recommended by PT    Recommendations for Other Services       Precautions / Restrictions Precautions Precautions: Fall Precaution Comments: hx of hip fx Restrictions Weight Bearing Restrictions: No    Mobility  Bed Mobility Overal bed mobility: Needs Assistance       Supine to sit: Modified independent (Device/Increase time)     General bed mobility comments: increased time and effort but wihtout physical assist or verbal cues   Transfers Overall transfer level: Needs assistance Equipment used: Rolling walker (2 wheeled) Transfers: Sit to/from Stand Sit to Stand: Min guard         General transfer comment: min guard for safety and immediate standing balance  Ambulation/Gait Ambulation/Gait assistance: Min guard Gait Distance (Feet): 50 Feet Assistive device: Rolling walker (2 wheeled) Gait Pattern/deviations: Step-through pattern;Decreased stride length;Trunk flexed Gait velocity: decreased   General Gait Details: slow pace of gait wiht  labored breathing - SpO2 at 98% on RA with HR 89 bpm wiht acitivity   Stairs             Wheelchair Mobility    Modified Rankin (Stroke Patients Only)       Balance Overall balance assessment: Needs assistance Sitting-balance support: No upper extremity supported;Feet supported Sitting balance-Leahy Scale: Good     Standing balance support: Bilateral upper extremity supported;During functional activity Standing balance-Leahy Scale: Fair Standing balance comment: requires RW                            Cognition Arousal/Alertness: Awake/alert Behavior During Therapy: WFL for tasks assessed/performed Overall Cognitive Status: Within Functional Limits for tasks assessed                                        Exercises      General Comments        Pertinent Vitals/Pain Pain Assessment: Faces Faces Pain Scale: Hurts little more Pain Location: generalized Pain Descriptors / Indicators: Grimacing;Moaning Pain Intervention(s): Limited activity within patient's tolerance;Monitored during session;Repositioned    Home Living                      Prior Function            PT Goals (current goals can now be found in the care plan section) Acute Rehab PT Goals Patient Stated Goal: go home PT Goal Formulation: With patient/family Time For Goal Achievement: 06/17/18 Potential to Achieve  Goals: Fair Progress towards PT goals: Progressing toward goals    Frequency    Min 2X/week      PT Plan Current plan remains appropriate    Co-evaluation              AM-PAC PT "6 Clicks" Daily Activity  Outcome Measure  Difficulty turning over in bed (including adjusting bedclothes, sheets and blankets)?: A Little Difficulty moving from lying on back to sitting on the side of the bed? : A Little Difficulty sitting down on and standing up from a chair with arms (e.g., wheelchair, bedside commode, etc,.)?: Unable Help needed  moving to and from a bed to chair (including a wheelchair)?: A Little Help needed walking in hospital room?: A Little Help needed climbing 3-5 steps with a railing? : A Lot 6 Click Score: 15    End of Session Equipment Utilized During Treatment: Gait belt Activity Tolerance: Patient tolerated treatment well Patient left: in chair;with call bell/phone within reach;with chair alarm set;with family/visitor present Nurse Communication: Mobility status PT Visit Diagnosis: Unsteadiness on feet (R26.81);Other abnormalities of gait and mobility (R26.89);Muscle weakness (generalized) (M62.81);History of falling (Z91.81);Difficulty in walking, not elsewhere classified (R26.2)     Time: 7253-6644 PT Time Calculation (min) (ACUTE ONLY): 21 min  Charges:  $Gait Training: 8-22 mins                     Lanney Gins, PT, DPT 06/09/18 10:49 AM Pager: [phone]

## 2018-06-09 NOTE — Progress Notes (Signed)
Patient was discharged to nursing home [facility]) by MD order; discharged instructions  review and give to patient and her husband with care notes and prescriptions; IV DIC; skin intact; patient will be escorted to the car by nurse tech via wheelchair. Patient's husband will transport her to the facility. Facility was called and report was given yo the nurse who is going to receive the patient.

## 2018-06-10 ENCOUNTER — Encounter: Payer: Self-pay | Admitting: *Deleted

## 2018-06-10 ENCOUNTER — Other Ambulatory Visit: Payer: Self-pay | Admitting: *Deleted

## 2018-06-10 DIAGNOSIS — K5731 Diverticulosis of large intestine without perforation or abscess with bleeding: Secondary | ICD-10-CM | POA: Diagnosis not present

## 2018-06-10 DIAGNOSIS — I482 Chronic atrial fibrillation: Secondary | ICD-10-CM | POA: Diagnosis not present

## 2018-06-10 DIAGNOSIS — D649 Anemia, unspecified: Secondary | ICD-10-CM | POA: Diagnosis not present

## 2018-06-10 DIAGNOSIS — J449 Chronic obstructive pulmonary disease, unspecified: Secondary | ICD-10-CM | POA: Diagnosis not present

## 2018-06-10 NOTE — Patient Outreach (Signed)
[name]) Care Management  06/10/2018  [name] [name] December 29, 1929 [id number]   CSW was able to make initial contact with patient and patient's husband, [name] [name] today to perform the initial assessment, as well as assess and assist with social work needs and services.  CSW introduced self, explained role and types of services provided through [facility] ([name]).  CSW further explained to patient and Mr. [name] [name] that CSW works with [name], [facility], also with [name].  CSW then explained the reason for the visit, indicating that [name] [name] thought that patient would benefit from social work services and resources to assist with discharge planning needs and services from the skilled nursing facility.  CSW obtained two HIPAA compliant identifiers from patient, which included patient's name and date of birth. Patient and Mr. [name] [name] appeared to be in good spirits today, reporting that patient has had a wonderful experience at [name], thus far.  Patient was just admitted to [facility] on Thursday, June 09, 2018 and has already had an opportunity to work with therapies (both physical and occupational).  Patient obviously does not have a tentative discharge date scheduled at this time; however, CSW will continue to follow along to assess and assist with discharge planning needs and services.  Patient endorses that she will be returning home to live with Mr. [name] [name] at time of discharge.  Patient is agreeable to home health services and durable medical equipment being arranged for her at time of discharge.  CSW will follow-up with patient at [facility] again next week to assess progress and to discuss patient's discharge plan of care. [name], BSW, MSW, LCSW  Licensed Education officer, environmental Health System  Mailing [place] [address], [place], [place]  [address] Physical Address[address], [place], [place] [address] Toll Free Main # [phone] Fax # [phone] Cell # [phone]  Office # [phone] [email or web address]

## 2018-06-14 DIAGNOSIS — I482 Chronic atrial fibrillation: Secondary | ICD-10-CM | POA: Diagnosis not present

## 2018-06-14 DIAGNOSIS — E119 Type 2 diabetes mellitus without complications: Secondary | ICD-10-CM | POA: Diagnosis not present

## 2018-06-14 DIAGNOSIS — D72829 Elevated white blood cell count, unspecified: Secondary | ICD-10-CM | POA: Diagnosis not present

## 2018-06-14 DIAGNOSIS — E876 Hypokalemia: Secondary | ICD-10-CM | POA: Diagnosis not present

## 2018-06-15 DIAGNOSIS — G4700 Insomnia, unspecified: Secondary | ICD-10-CM | POA: Diagnosis not present

## 2018-06-15 DIAGNOSIS — J189 Pneumonia, unspecified organism: Secondary | ICD-10-CM | POA: Diagnosis not present

## 2018-06-17 ENCOUNTER — Other Ambulatory Visit: Payer: Self-pay | Admitting: *Deleted

## 2018-06-17 DIAGNOSIS — J189 Pneumonia, unspecified organism: Secondary | ICD-10-CM | POA: Diagnosis not present

## 2018-06-17 DIAGNOSIS — M549 Dorsalgia, unspecified: Secondary | ICD-10-CM | POA: Diagnosis not present

## 2018-06-17 DIAGNOSIS — D72829 Elevated white blood cell count, unspecified: Secondary | ICD-10-CM | POA: Diagnosis not present

## 2018-06-17 DIAGNOSIS — K649 Unspecified hemorrhoids: Secondary | ICD-10-CM | POA: Diagnosis not present

## 2018-06-17 NOTE — Patient Outreach (Signed)
[facility]) Care Management  06/17/2018  [name] [name] 1929/12/07 [id number]  CSW was able to meet with patient and patient's husband, [name] [name] today at [facility] [place] where patient currently resides to receive short-term rehabilitative services, to perform a routine visit.  Patient reported that she was recently diagnosed with Pneumonia; therefore, she has been started on a 7 days course of antibiotics.  Patient admits that she is already started to feel much better.  Patient indicated that she is working well with therapies, both physical and occupational, and that she is able to ambulate at least 20 feet with the assistance of her Rolator walker.  Patient is hopeful that she will be able to return home to live with [name] by the end of next week.  Patient is agreeable to home health services being arranged for her at time of discharge from [facility].  CSW agreed to follow-up with patient again next week (Friday, June 24, 2018) at 9:00AM, to assess and assist with discharge planning needs and services.  Patient believes that she already has all the durable medical equipment that she will need in the home.  [name] reported that he is able to provide patient with 24 hour care and supervision in the home and feels confident in being able to assist patient with activities of daily living, if necessary.  [name], BSW, MSW, LCSW  Licensed Education officer, environmental Health System  Mailing [place] [address] [place], [place] [address] Physical Address-[address], [place], [place] [address] Toll Free Main # [phone] Fax # [phone] Cell # [phone]  Office # [phone] [email or web address]

## 2018-06-20 DIAGNOSIS — R110 Nausea: Secondary | ICD-10-CM | POA: Diagnosis not present

## 2018-06-20 DIAGNOSIS — Z7901 Long term (current) use of anticoagulants: Secondary | ICD-10-CM | POA: Diagnosis not present

## 2018-06-20 DIAGNOSIS — I482 Chronic atrial fibrillation: Secondary | ICD-10-CM | POA: Diagnosis not present

## 2018-06-22 DIAGNOSIS — G4700 Insomnia, unspecified: Secondary | ICD-10-CM | POA: Diagnosis not present

## 2018-06-23 DIAGNOSIS — R0781 Pleurodynia: Secondary | ICD-10-CM | POA: Diagnosis not present

## 2018-06-23 DIAGNOSIS — R062 Wheezing: Secondary | ICD-10-CM | POA: Diagnosis not present

## 2018-06-24 ENCOUNTER — Other Ambulatory Visit: Payer: Self-pay | Admitting: *Deleted

## 2018-06-24 DIAGNOSIS — R0781 Pleurodynia: Secondary | ICD-10-CM | POA: Diagnosis not present

## 2018-06-24 NOTE — Patient Outreach (Signed)
[place] [facility]) Care Management  06/24/2018  Mahitha Hickling 01/04/1930 [id number]  CSW was able to meet with patient and patient's husband, [name] today at [facility] [place] where patient currently resides to receive short-term rehabilitative services, to perform a routine visit.  Patient reported being very tired today, admitting that she is not sleeping well at night.  Patient indicated that she will try and nap throughout the day today, as she is too weak and fatigued to do much else. Chest X-rays were performed on Thursday, June 23, 2018 to check the status of patient's Pneumonia, but patient, nor Mr. Rush have received the results yet.  Patient stopped taking antibiotics for Pneumonia on Wednesday, June 22, 2018.  Patient also admitted that she has no appetite, resulting in another 3-4 pound weight loss, for a total of 10 pounds over the last month. [name] has been bringing in food to the facility to try and spark patient's appetite, without success. Patient reports receiving very vigorous therapies (both physical and occupational), and that she is actually very pleased with her progress.  Patient is hopeful that she will be able to return home within the next week, but won't know for sure until she has her plan of care meeting, scheduled for next Friday, July 01, 2018 at [facility] will make arrangements to attend this meeting to assess and assist with discharge planning needs and services. [name], BSW, MSW, LCSW  Licensed Education officer, environmental Health System  Mailing [place] [address] [place], [place] [address] Physical Address-[address], [place],  [address] Toll Free Main # [phone] Fax # [phone] Cell # [phone]  Office # [phone] [name]

## 2018-06-28 ENCOUNTER — Other Ambulatory Visit: Payer: Self-pay | Admitting: *Deleted

## 2018-06-28 ENCOUNTER — Encounter: Payer: Self-pay | Admitting: *Deleted

## 2018-06-28 NOTE — Patient Outreach (Addendum)
[place] [facility]) Care Management  06/28/2018  [name] [name] 10-22-1929 [id number]   CSW received a call from patient's husband, [name] this morning informing CSW that patient had been discharged from [facility] [place] where patient was residing to receive short-term rehabilitative services, on Monday, June 27, 2018.  [name] reports that patient is doing very well and happy to be back home.  [name] further reported that patient is able to perform all activities of daily living independently. [name] was confused about the services that CSW is able to provide, thinking that [facility] is a home health agency.  CSW politely explained the difference and then informed [name] that home health services have been arranged for patient through [facility] and that home health services typically begin within 24-48 hours of discharge.  These home health services include a physical therapist, occupational therapist and a nurse. CSW will perform a case closure on patient, as all goals of treatment have been met from social work standpoint and no additional social work needs have been identified at this time.  CSW will place an order for patient to receive an RNCM through [facility] to follow patient in the community for disease management services.  CSW will fax an update to patient's Primary Care Physician, Dr. [name] to ensure that they are aware of CSW's involvement with patient's plan of care. [name], BSW, MSW, LCSW  Licensed Education officer, environmental Health System  Mailing [place] [address] [place], [place] [address] Physical Address-[address], [place], [place] [address] Toll Free Main # [phone] Fax # [phone] Cell # [phone]  Office #  [phone] [email or web address]

## 2018-06-29 ENCOUNTER — Other Ambulatory Visit: Payer: Self-pay | Admitting: *Deleted

## 2018-06-29 NOTE — Patient Outreach (Signed)
[facility]) Care Management  06/29/2018  [name] [name] 1930/09/24 [id number]  Referral received 9/25 post discharged from SNF  Transition of care (Initial)  RN spoke with pt and introduced the [facility] services and purpose for today's call. Pt and spouse both available on this call. RN inquired on medications as spouse [name] [name]) indicated he has had a long discussion on her medication with the pharmacy and more aware of the purpose of most of her medications now. RN was able to completed the transition of care template and inquired further on pt's needs. No nursing issues to address at this time however states [name] [name] will resume services pending a home visit date. Spouse verifies office visit with pt's primary provider Dr. Sharlett [name] this Friday. RN office to continue with transition of care call due to no issues for community home visits as pt and spouse very receptive to this plan of care.   Plan of care discussed related to decrease risk of readmission, adherence with medications and appointments. Will follow up next week with ongoing contact to inquired further on pt's adherence. No further inquires or request.  Patient was recently discharged from hospital and all medications have been reviewed. THN CM Care Plan Problem One     Most Recent Value  Care Plan Problem One  Risk of readmission related to complications of GI hemorrhage that required rehab stay  Role Documenting the Problem One  Care Management [place] for Problem One  Active  THN Long Term Goal   Pt will not be readmitted to acute facility within the next 31 days   THN Long Term Goal Start Date  06/29/18  Interventions for Problem One Long Term Goal  Will educate on prevention measures and discuss the improtance of following the dischare instructions and following recommendations with the involved [place] agency.  THN CM Short Term Goal #1   Adherence with attending all post-op SNF  appointments over the next 30 days.  THN CM Short Term Goal #1 Start Date  06/29/18  Interventions for Short Term Goal #1  Will strongly encouraged adherence with attending all scheudled appointments to effectively manage her GI symptoms.  THN CM Short Term Goal #2   Adherence with medications post SNF discharge over the next 30 days  THN CM Short Term Goal #2 Start Date  06/29/18  Interventions for Short Term Goal #2  Will strongly encouraged pt to maintain her medication regimen in efforts to decrease her risk of acute syptoms and promote healing.      [name] Mina, RN Care Management Coordinator [place] Office [phone]

## 2018-06-30 DIAGNOSIS — H353133 Nonexudative age-related macular degeneration, bilateral, advanced atrophic without subfoveal involvement: Secondary | ICD-10-CM | POA: Diagnosis not present

## 2018-06-30 DIAGNOSIS — H401132 Primary open-angle glaucoma, bilateral, moderate stage: Secondary | ICD-10-CM | POA: Diagnosis not present

## 2018-07-01 ENCOUNTER — Ambulatory Visit: Payer: Medicare HMO | Admitting: *Deleted

## 2018-07-01 DIAGNOSIS — I482 Chronic atrial fibrillation: Secondary | ICD-10-CM | POA: Diagnosis not present

## 2018-07-01 DIAGNOSIS — G2581 Restless legs syndrome: Secondary | ICD-10-CM | POA: Diagnosis not present

## 2018-07-01 DIAGNOSIS — R2681 Unsteadiness on feet: Secondary | ICD-10-CM | POA: Diagnosis not present

## 2018-07-01 DIAGNOSIS — E1151 Type 2 diabetes mellitus with diabetic peripheral angiopathy without gangrene: Secondary | ICD-10-CM | POA: Diagnosis not present

## 2018-07-01 DIAGNOSIS — Z6825 Body mass index (BMI) 25.0-25.9, adult: Secondary | ICD-10-CM | POA: Diagnosis not present

## 2018-07-01 DIAGNOSIS — E109 Type 1 diabetes mellitus without complications: Secondary | ICD-10-CM | POA: Diagnosis not present

## 2018-07-01 DIAGNOSIS — Z7901 Long term (current) use of anticoagulants: Secondary | ICD-10-CM | POA: Diagnosis not present

## 2018-07-01 DIAGNOSIS — J449 Chronic obstructive pulmonary disease, unspecified: Secondary | ICD-10-CM | POA: Diagnosis not present

## 2018-07-05 DIAGNOSIS — F329 Major depressive disorder, single episode, unspecified: Secondary | ICD-10-CM | POA: Diagnosis not present

## 2018-07-05 DIAGNOSIS — K5732 Diverticulitis of large intestine without perforation or abscess without bleeding: Secondary | ICD-10-CM | POA: Diagnosis not present

## 2018-07-05 DIAGNOSIS — F3289 Other specified depressive episodes: Secondary | ICD-10-CM | POA: Diagnosis not present

## 2018-07-05 DIAGNOSIS — E1140 Type 2 diabetes mellitus with diabetic neuropathy, unspecified: Secondary | ICD-10-CM | POA: Diagnosis not present

## 2018-07-05 DIAGNOSIS — E1151 Type 2 diabetes mellitus with diabetic peripheral angiopathy without gangrene: Secondary | ICD-10-CM | POA: Diagnosis not present

## 2018-07-05 DIAGNOSIS — I482 Chronic atrial fibrillation, unspecified: Secondary | ICD-10-CM | POA: Diagnosis not present

## 2018-07-05 DIAGNOSIS — N3281 Overactive bladder: Secondary | ICD-10-CM | POA: Diagnosis not present

## 2018-07-05 DIAGNOSIS — G2581 Restless legs syndrome: Secondary | ICD-10-CM | POA: Diagnosis not present

## 2018-07-05 DIAGNOSIS — J449 Chronic obstructive pulmonary disease, unspecified: Secondary | ICD-10-CM | POA: Diagnosis not present

## 2018-07-05 DIAGNOSIS — I10 Essential (primary) hypertension: Secondary | ICD-10-CM | POA: Diagnosis not present

## 2018-07-08 ENCOUNTER — Other Ambulatory Visit: Payer: Self-pay | Admitting: *Deleted

## 2018-07-08 ENCOUNTER — Encounter: Payer: Self-pay | Admitting: *Deleted

## 2018-07-08 NOTE — Patient Outreach (Signed)
[facility]) Care Management  07/08/2018  [name] [name] January 10, 1930 [id number]    Transition of care  RN spoke with [name] [name] (spouse) of pt who put the phone on speaker for pt to intervene during this call. Pt very alert and able to answer most of the initial assessment inquires today. Pt states she is doing well however currently awaiting for [place] to arrange for PT services over the next week. RN able to obtain the necessary information in order to completed the initial assessment and review the current plan of care in place related to her GI dx. Verified pt continue to attend her appointment pending upcoming appointments and spouse inquired about two medications he would like to refill for pt's anxiety. RN strongly encouraged pt/spouse to inquired on appointment next week with her provided due to spouse verifying he has a few doses left to give if needed. Goals and interventions discussed and adjusted accordingly to allow ongoing adherence for pt to participate. RN will re-evaluate next week with ogoing transition of care contact. No additional issues to address at this time.   THN CM Care Plan Problem One     Most Recent Value  Care Plan Problem One  Risk of readmission related to complications of GI hemorrhage that required rehab stay  Role Documenting the Problem One  Care Management [place] for Problem One  Active  THN Long Term Goal   Pt will not be readmitted to acute facility within the next 31 days   THN Long Term Goal Start Date  06/29/18  Interventions for Problem One Long Term Goal  Continue to discuss invovled [facility] services as an ongoing prevention measure for readmission. Will continue to stress the importance of  managing her care to avoid possible acute issues from occuring. Will continue to follow up accordingly with pt's behaviors with these present prevention measures.  THN CM Short Term Goal #1   Adherence with attending all  post-op SNF appointments over the next 30 days.  THN CM Short Term Goal #1 Start Date  06/29/18  Interventions for Short Term Goal #1  Will continue to stress the importance of attending all medical appointments to avoid NO SHOW fees and to reschedule if necessary for those missed appointments. Will continue to verify pt has sufficient transportation to all her pending appointments (supportive spouse).  THN CM Short Term Goal #2   Adherence with medications post SNF discharge over the next 30 days  THN CM Short Term Goal #2 Start Date  06/29/18  Interventions for Short Term Goal #2  Will verify supply on medications as spouse mention several he would like to get re-initiated and will speak with the provider's office concerning filling of these medications. Will reassess next week on the progress and extend this goal to allow adherence with all her medications.      [name] Mina, RN Care Management Coordinator [place] Office [phone]

## 2018-07-12 DIAGNOSIS — E1151 Type 2 diabetes mellitus with diabetic peripheral angiopathy without gangrene: Secondary | ICD-10-CM | POA: Diagnosis not present

## 2018-07-12 DIAGNOSIS — I10 Essential (primary) hypertension: Secondary | ICD-10-CM | POA: Diagnosis not present

## 2018-07-12 DIAGNOSIS — E1140 Type 2 diabetes mellitus with diabetic neuropathy, unspecified: Secondary | ICD-10-CM | POA: Diagnosis not present

## 2018-07-12 DIAGNOSIS — I482 Chronic atrial fibrillation, unspecified: Secondary | ICD-10-CM | POA: Diagnosis not present

## 2018-07-12 DIAGNOSIS — K5732 Diverticulitis of large intestine without perforation or abscess without bleeding: Secondary | ICD-10-CM | POA: Diagnosis not present

## 2018-07-12 DIAGNOSIS — N3281 Overactive bladder: Secondary | ICD-10-CM | POA: Diagnosis not present

## 2018-07-12 DIAGNOSIS — F329 Major depressive disorder, single episode, unspecified: Secondary | ICD-10-CM | POA: Diagnosis not present

## 2018-07-12 DIAGNOSIS — G2581 Restless legs syndrome: Secondary | ICD-10-CM | POA: Diagnosis not present

## 2018-07-12 DIAGNOSIS — J449 Chronic obstructive pulmonary disease, unspecified: Secondary | ICD-10-CM | POA: Diagnosis not present

## 2018-07-13 DIAGNOSIS — K5732 Diverticulitis of large intestine without perforation or abscess without bleeding: Secondary | ICD-10-CM | POA: Diagnosis not present

## 2018-07-13 DIAGNOSIS — F329 Major depressive disorder, single episode, unspecified: Secondary | ICD-10-CM | POA: Diagnosis not present

## 2018-07-13 DIAGNOSIS — N3281 Overactive bladder: Secondary | ICD-10-CM | POA: Diagnosis not present

## 2018-07-13 DIAGNOSIS — I482 Chronic atrial fibrillation, unspecified: Secondary | ICD-10-CM | POA: Diagnosis not present

## 2018-07-13 DIAGNOSIS — I10 Essential (primary) hypertension: Secondary | ICD-10-CM | POA: Diagnosis not present

## 2018-07-13 DIAGNOSIS — J449 Chronic obstructive pulmonary disease, unspecified: Secondary | ICD-10-CM | POA: Diagnosis not present

## 2018-07-13 DIAGNOSIS — E1140 Type 2 diabetes mellitus with diabetic neuropathy, unspecified: Secondary | ICD-10-CM | POA: Diagnosis not present

## 2018-07-13 DIAGNOSIS — E1151 Type 2 diabetes mellitus with diabetic peripheral angiopathy without gangrene: Secondary | ICD-10-CM | POA: Diagnosis not present

## 2018-07-13 DIAGNOSIS — G2581 Restless legs syndrome: Secondary | ICD-10-CM | POA: Diagnosis not present

## 2018-07-14 DIAGNOSIS — E1151 Type 2 diabetes mellitus with diabetic peripheral angiopathy without gangrene: Secondary | ICD-10-CM | POA: Diagnosis not present

## 2018-07-14 DIAGNOSIS — G2581 Restless legs syndrome: Secondary | ICD-10-CM | POA: Diagnosis not present

## 2018-07-14 DIAGNOSIS — E1140 Type 2 diabetes mellitus with diabetic neuropathy, unspecified: Secondary | ICD-10-CM | POA: Diagnosis not present

## 2018-07-14 DIAGNOSIS — F329 Major depressive disorder, single episode, unspecified: Secondary | ICD-10-CM | POA: Diagnosis not present

## 2018-07-14 DIAGNOSIS — I482 Chronic atrial fibrillation, unspecified: Secondary | ICD-10-CM | POA: Diagnosis not present

## 2018-07-14 DIAGNOSIS — N3281 Overactive bladder: Secondary | ICD-10-CM | POA: Diagnosis not present

## 2018-07-14 DIAGNOSIS — J449 Chronic obstructive pulmonary disease, unspecified: Secondary | ICD-10-CM | POA: Diagnosis not present

## 2018-07-14 DIAGNOSIS — I10 Essential (primary) hypertension: Secondary | ICD-10-CM | POA: Diagnosis not present

## 2018-07-14 DIAGNOSIS — K5732 Diverticulitis of large intestine without perforation or abscess without bleeding: Secondary | ICD-10-CM | POA: Diagnosis not present

## 2018-07-15 ENCOUNTER — Other Ambulatory Visit: Payer: Self-pay | Admitting: *Deleted

## 2018-07-15 NOTE — Patient Outreach (Signed)
[facility]) Care Management  07/15/2018  Adalee Kathan 1929/11/05 [id number]   Transition of care  RN spoke with pt's caregiver (spouse) with update on pt's ongoing management of care. Spouse reports pt is doing well and continue with [place] with nursing and PT services. No reported issues or acute problems at this time as pt continue to attend all medical appointments with sufficient transportation services and taking all medications (updated list today) with few changes as documented. Will update plan of care and discussed goals and adjust interventions accordingly based upon pt's progress.   Will inquired and continue to offer any community base needs for this pt. Caregiver denies any needs at this time. Caregiver inquired on nutritional needs for this pt related to her GI bleed for food tolerance. RN strongly encourage if needed to request a nutritionist to consult on pt for proper dietary measures.  Also encouraged to avoid spicy, hot and hard food items that take longer to process such as nuts.   Will scheduled ongoing transition of care contact for next week.  Raina Mina, RN Care Management Coordinator [place] Office [phone]

## 2018-07-18 DIAGNOSIS — K5732 Diverticulitis of large intestine without perforation or abscess without bleeding: Secondary | ICD-10-CM | POA: Diagnosis not present

## 2018-07-18 DIAGNOSIS — I482 Chronic atrial fibrillation, unspecified: Secondary | ICD-10-CM | POA: Diagnosis not present

## 2018-07-18 DIAGNOSIS — E1151 Type 2 diabetes mellitus with diabetic peripheral angiopathy without gangrene: Secondary | ICD-10-CM | POA: Diagnosis not present

## 2018-07-18 DIAGNOSIS — F329 Major depressive disorder, single episode, unspecified: Secondary | ICD-10-CM | POA: Diagnosis not present

## 2018-07-18 DIAGNOSIS — I10 Essential (primary) hypertension: Secondary | ICD-10-CM | POA: Diagnosis not present

## 2018-07-18 DIAGNOSIS — J449 Chronic obstructive pulmonary disease, unspecified: Secondary | ICD-10-CM | POA: Diagnosis not present

## 2018-07-18 DIAGNOSIS — N3281 Overactive bladder: Secondary | ICD-10-CM | POA: Diagnosis not present

## 2018-07-18 DIAGNOSIS — E1140 Type 2 diabetes mellitus with diabetic neuropathy, unspecified: Secondary | ICD-10-CM | POA: Diagnosis not present

## 2018-07-18 DIAGNOSIS — G2581 Restless legs syndrome: Secondary | ICD-10-CM | POA: Diagnosis not present

## 2018-07-20 DIAGNOSIS — F329 Major depressive disorder, single episode, unspecified: Secondary | ICD-10-CM | POA: Diagnosis not present

## 2018-07-20 DIAGNOSIS — J449 Chronic obstructive pulmonary disease, unspecified: Secondary | ICD-10-CM | POA: Diagnosis not present

## 2018-07-20 DIAGNOSIS — G2581 Restless legs syndrome: Secondary | ICD-10-CM | POA: Diagnosis not present

## 2018-07-20 DIAGNOSIS — I482 Chronic atrial fibrillation, unspecified: Secondary | ICD-10-CM | POA: Diagnosis not present

## 2018-07-20 DIAGNOSIS — E1140 Type 2 diabetes mellitus with diabetic neuropathy, unspecified: Secondary | ICD-10-CM | POA: Diagnosis not present

## 2018-07-20 DIAGNOSIS — E1151 Type 2 diabetes mellitus with diabetic peripheral angiopathy without gangrene: Secondary | ICD-10-CM | POA: Diagnosis not present

## 2018-07-20 DIAGNOSIS — K5732 Diverticulitis of large intestine without perforation or abscess without bleeding: Secondary | ICD-10-CM | POA: Diagnosis not present

## 2018-07-20 DIAGNOSIS — N3281 Overactive bladder: Secondary | ICD-10-CM | POA: Diagnosis not present

## 2018-07-20 DIAGNOSIS — I10 Essential (primary) hypertension: Secondary | ICD-10-CM | POA: Diagnosis not present

## 2018-07-22 ENCOUNTER — Other Ambulatory Visit: Payer: Self-pay | Admitting: *Deleted

## 2018-07-22 DIAGNOSIS — F329 Major depressive disorder, single episode, unspecified: Secondary | ICD-10-CM | POA: Diagnosis not present

## 2018-07-22 DIAGNOSIS — N3281 Overactive bladder: Secondary | ICD-10-CM | POA: Diagnosis not present

## 2018-07-22 DIAGNOSIS — I10 Essential (primary) hypertension: Secondary | ICD-10-CM | POA: Diagnosis not present

## 2018-07-22 DIAGNOSIS — E1151 Type 2 diabetes mellitus with diabetic peripheral angiopathy without gangrene: Secondary | ICD-10-CM | POA: Diagnosis not present

## 2018-07-22 DIAGNOSIS — J449 Chronic obstructive pulmonary disease, unspecified: Secondary | ICD-10-CM | POA: Diagnosis not present

## 2018-07-22 DIAGNOSIS — G2581 Restless legs syndrome: Secondary | ICD-10-CM | POA: Diagnosis not present

## 2018-07-22 DIAGNOSIS — I482 Chronic atrial fibrillation, unspecified: Secondary | ICD-10-CM | POA: Diagnosis not present

## 2018-07-22 DIAGNOSIS — K5732 Diverticulitis of large intestine without perforation or abscess without bleeding: Secondary | ICD-10-CM | POA: Diagnosis not present

## 2018-07-22 DIAGNOSIS — E1140 Type 2 diabetes mellitus with diabetic neuropathy, unspecified: Secondary | ICD-10-CM | POA: Diagnosis not present

## 2018-07-22 NOTE — Patient Outreach (Signed)
[place] [facility]) Care Management  07/22/2018  Carlos Quackenbush Oct 04, 1930 [id number]    Outreach call unsuccessful however RN able to leave a HIPAA approved voice message requesting a call back. Will continue to follow up with another call next week and inquire further on pt's progress in managing her ongoing needs.  Raina Mina, RN Care Management Coordinator [place] Office [phone]

## 2018-07-25 ENCOUNTER — Encounter: Payer: Self-pay | Admitting: Podiatry

## 2018-07-25 ENCOUNTER — Ambulatory Visit: Payer: Medicare HMO | Admitting: Podiatry

## 2018-07-25 DIAGNOSIS — M79676 Pain in unspecified toe(s): Secondary | ICD-10-CM

## 2018-07-25 DIAGNOSIS — F329 Major depressive disorder, single episode, unspecified: Secondary | ICD-10-CM | POA: Diagnosis not present

## 2018-07-25 DIAGNOSIS — E1151 Type 2 diabetes mellitus with diabetic peripheral angiopathy without gangrene: Secondary | ICD-10-CM | POA: Diagnosis not present

## 2018-07-25 DIAGNOSIS — G629 Polyneuropathy, unspecified: Secondary | ICD-10-CM

## 2018-07-25 DIAGNOSIS — E1140 Type 2 diabetes mellitus with diabetic neuropathy, unspecified: Secondary | ICD-10-CM | POA: Diagnosis not present

## 2018-07-25 DIAGNOSIS — B351 Tinea unguium: Secondary | ICD-10-CM

## 2018-07-25 DIAGNOSIS — K5732 Diverticulitis of large intestine without perforation or abscess without bleeding: Secondary | ICD-10-CM | POA: Diagnosis not present

## 2018-07-25 DIAGNOSIS — I482 Chronic atrial fibrillation, unspecified: Secondary | ICD-10-CM | POA: Diagnosis not present

## 2018-07-25 DIAGNOSIS — J449 Chronic obstructive pulmonary disease, unspecified: Secondary | ICD-10-CM | POA: Diagnosis not present

## 2018-07-25 DIAGNOSIS — I10 Essential (primary) hypertension: Secondary | ICD-10-CM | POA: Diagnosis not present

## 2018-07-25 DIAGNOSIS — N3281 Overactive bladder: Secondary | ICD-10-CM | POA: Diagnosis not present

## 2018-07-25 DIAGNOSIS — G2581 Restless legs syndrome: Secondary | ICD-10-CM | POA: Diagnosis not present

## 2018-07-26 DIAGNOSIS — K5732 Diverticulitis of large intestine without perforation or abscess without bleeding: Secondary | ICD-10-CM | POA: Diagnosis not present

## 2018-07-26 DIAGNOSIS — J449 Chronic obstructive pulmonary disease, unspecified: Secondary | ICD-10-CM | POA: Diagnosis not present

## 2018-07-26 DIAGNOSIS — N3281 Overactive bladder: Secondary | ICD-10-CM | POA: Diagnosis not present

## 2018-07-26 DIAGNOSIS — G2581 Restless legs syndrome: Secondary | ICD-10-CM | POA: Diagnosis not present

## 2018-07-26 DIAGNOSIS — E1140 Type 2 diabetes mellitus with diabetic neuropathy, unspecified: Secondary | ICD-10-CM | POA: Diagnosis not present

## 2018-07-26 DIAGNOSIS — I482 Chronic atrial fibrillation, unspecified: Secondary | ICD-10-CM | POA: Diagnosis not present

## 2018-07-26 DIAGNOSIS — I10 Essential (primary) hypertension: Secondary | ICD-10-CM | POA: Diagnosis not present

## 2018-07-26 DIAGNOSIS — F329 Major depressive disorder, single episode, unspecified: Secondary | ICD-10-CM | POA: Diagnosis not present

## 2018-07-26 DIAGNOSIS — E1151 Type 2 diabetes mellitus with diabetic peripheral angiopathy without gangrene: Secondary | ICD-10-CM | POA: Diagnosis not present

## 2018-07-27 ENCOUNTER — Other Ambulatory Visit: Payer: Self-pay | Admitting: *Deleted

## 2018-07-27 DIAGNOSIS — N3281 Overactive bladder: Secondary | ICD-10-CM | POA: Diagnosis not present

## 2018-07-27 DIAGNOSIS — K5732 Diverticulitis of large intestine without perforation or abscess without bleeding: Secondary | ICD-10-CM | POA: Diagnosis not present

## 2018-07-27 DIAGNOSIS — J449 Chronic obstructive pulmonary disease, unspecified: Secondary | ICD-10-CM | POA: Diagnosis not present

## 2018-07-27 DIAGNOSIS — I482 Chronic atrial fibrillation, unspecified: Secondary | ICD-10-CM | POA: Diagnosis not present

## 2018-07-27 DIAGNOSIS — E1151 Type 2 diabetes mellitus with diabetic peripheral angiopathy without gangrene: Secondary | ICD-10-CM | POA: Diagnosis not present

## 2018-07-27 DIAGNOSIS — G2581 Restless legs syndrome: Secondary | ICD-10-CM | POA: Diagnosis not present

## 2018-07-27 DIAGNOSIS — E1140 Type 2 diabetes mellitus with diabetic neuropathy, unspecified: Secondary | ICD-10-CM | POA: Diagnosis not present

## 2018-07-27 DIAGNOSIS — F329 Major depressive disorder, single episode, unspecified: Secondary | ICD-10-CM | POA: Diagnosis not present

## 2018-07-27 DIAGNOSIS — I10 Essential (primary) hypertension: Secondary | ICD-10-CM | POA: Diagnosis not present

## 2018-07-28 NOTE — Progress Notes (Signed)
Patient ID: [name] [name], female   DOB: Jul 09, 1930, 84 y.o.   MRN: [id number]  Subjective: 84 y.o.-year-old female returns the office today for painful, elongated, thickened toenails which she is unable to trim herself. Denies any redness or drainage around the nails. Denies any acute changes since last appointment and no new complaints today. Denies any systemic complaints such as fevers, chills, nausea, vomiting.   She is having missed her last appointments because of "bleeding issues"  Objective: NAD; presents with her husband who also has no new concerns. DP/PT pulses 1/4 bilaterally, CRT less than 3 seconds Protective sensation decreased with Simms Weinstein monofilament Nails hypertrophic, dystrophic, elongated, brittle, discolored 10. There is tenderness overlying the nails 1-5 bilaterally. There is no surrounding erythema or drainage along the nail sites. No open lesions or pre-ulcerative lesions are identified. No pain with calf compression, swelling, warmth, erythema.  Assessment: Patient presents with symptomatic onychomycosis  Plan: -Treatment options including alternatives, risks, complications were discussed -Nails sharply debrided 10 without complication/bleeding. -Discussed daily foot inspection. If there are any changes, to call the office immediately.  -Follow-up in 3 months or sooner if any problems are to arise. In the meantime, encouraged to call the office with any questions, concerns, changes symptoms.  Celesta Gentile, DPM

## 2018-07-28 NOTE — Patient Outreach (Signed)
[place] [facility]) Care Management  07/27/2018  Marlita Keil [birth year] [id number]    RN attempted to reach pt however unsuccessful but was able to leave a HIPAA approved voice message requesting a call back. Will continue outreach calls accordingly. RN will also send outreach letter and await possible call back for ongoing [facility] services.   Raina Mina, RN Care Management Coordinator [place] Office [phone]

## 2018-07-29 DIAGNOSIS — E1140 Type 2 diabetes mellitus with diabetic neuropathy, unspecified: Secondary | ICD-10-CM | POA: Diagnosis not present

## 2018-07-29 DIAGNOSIS — I482 Chronic atrial fibrillation, unspecified: Secondary | ICD-10-CM | POA: Diagnosis not present

## 2018-07-29 DIAGNOSIS — J449 Chronic obstructive pulmonary disease, unspecified: Secondary | ICD-10-CM | POA: Diagnosis not present

## 2018-07-29 DIAGNOSIS — F329 Major depressive disorder, single episode, unspecified: Secondary | ICD-10-CM | POA: Diagnosis not present

## 2018-07-29 DIAGNOSIS — K5732 Diverticulitis of large intestine without perforation or abscess without bleeding: Secondary | ICD-10-CM | POA: Diagnosis not present

## 2018-07-29 DIAGNOSIS — I10 Essential (primary) hypertension: Secondary | ICD-10-CM | POA: Diagnosis not present

## 2018-07-29 DIAGNOSIS — G2581 Restless legs syndrome: Secondary | ICD-10-CM | POA: Diagnosis not present

## 2018-07-29 DIAGNOSIS — N3281 Overactive bladder: Secondary | ICD-10-CM | POA: Diagnosis not present

## 2018-07-29 DIAGNOSIS — E1151 Type 2 diabetes mellitus with diabetic peripheral angiopathy without gangrene: Secondary | ICD-10-CM | POA: Diagnosis not present

## 2018-08-02 DIAGNOSIS — Z7901 Long term (current) use of anticoagulants: Secondary | ICD-10-CM | POA: Diagnosis not present

## 2018-08-02 DIAGNOSIS — F329 Major depressive disorder, single episode, unspecified: Secondary | ICD-10-CM | POA: Diagnosis not present

## 2018-08-02 DIAGNOSIS — N3281 Overactive bladder: Secondary | ICD-10-CM | POA: Diagnosis not present

## 2018-08-02 DIAGNOSIS — E1151 Type 2 diabetes mellitus with diabetic peripheral angiopathy without gangrene: Secondary | ICD-10-CM | POA: Diagnosis not present

## 2018-08-02 DIAGNOSIS — K5732 Diverticulitis of large intestine without perforation or abscess without bleeding: Secondary | ICD-10-CM | POA: Diagnosis not present

## 2018-08-02 DIAGNOSIS — E1140 Type 2 diabetes mellitus with diabetic neuropathy, unspecified: Secondary | ICD-10-CM | POA: Diagnosis not present

## 2018-08-02 DIAGNOSIS — Z23 Encounter for immunization: Secondary | ICD-10-CM | POA: Diagnosis not present

## 2018-08-02 DIAGNOSIS — I10 Essential (primary) hypertension: Secondary | ICD-10-CM | POA: Diagnosis not present

## 2018-08-02 DIAGNOSIS — I482 Chronic atrial fibrillation, unspecified: Secondary | ICD-10-CM | POA: Diagnosis not present

## 2018-08-02 DIAGNOSIS — G2581 Restless legs syndrome: Secondary | ICD-10-CM | POA: Diagnosis not present

## 2018-08-02 DIAGNOSIS — J449 Chronic obstructive pulmonary disease, unspecified: Secondary | ICD-10-CM | POA: Diagnosis not present

## 2018-08-03 ENCOUNTER — Other Ambulatory Visit: Payer: Self-pay | Admitting: *Deleted

## 2018-08-03 DIAGNOSIS — F329 Major depressive disorder, single episode, unspecified: Secondary | ICD-10-CM | POA: Diagnosis not present

## 2018-08-03 DIAGNOSIS — J449 Chronic obstructive pulmonary disease, unspecified: Secondary | ICD-10-CM | POA: Diagnosis not present

## 2018-08-03 DIAGNOSIS — N3281 Overactive bladder: Secondary | ICD-10-CM | POA: Diagnosis not present

## 2018-08-03 DIAGNOSIS — E1140 Type 2 diabetes mellitus with diabetic neuropathy, unspecified: Secondary | ICD-10-CM | POA: Diagnosis not present

## 2018-08-03 DIAGNOSIS — E1151 Type 2 diabetes mellitus with diabetic peripheral angiopathy without gangrene: Secondary | ICD-10-CM | POA: Diagnosis not present

## 2018-08-03 DIAGNOSIS — I482 Chronic atrial fibrillation, unspecified: Secondary | ICD-10-CM | POA: Diagnosis not present

## 2018-08-03 DIAGNOSIS — I10 Essential (primary) hypertension: Secondary | ICD-10-CM | POA: Diagnosis not present

## 2018-08-03 DIAGNOSIS — G2581 Restless legs syndrome: Secondary | ICD-10-CM | POA: Diagnosis not present

## 2018-08-03 DIAGNOSIS — K5732 Diverticulitis of large intestine without perforation or abscess without bleeding: Secondary | ICD-10-CM | POA: Diagnosis not present

## 2018-08-03 NOTE — Patient Outreach (Signed)
[place] [facility]) Care Management  08/03/2018  [name] [name] Jun 14, 1930 [id number]    RN 3rd attempt to reach pt however remains unsuccessful. RN able to leave a HIPAA approved voice message requesting a call back. Note letter has been sent to pt with no response. Will close this case per policy required.  Raina Mina, RN Care Management Coordinator [place] Office [phone]

## 2018-08-04 DIAGNOSIS — I10 Essential (primary) hypertension: Secondary | ICD-10-CM | POA: Diagnosis not present

## 2018-08-04 DIAGNOSIS — E1140 Type 2 diabetes mellitus with diabetic neuropathy, unspecified: Secondary | ICD-10-CM | POA: Diagnosis not present

## 2018-08-04 DIAGNOSIS — E1151 Type 2 diabetes mellitus with diabetic peripheral angiopathy without gangrene: Secondary | ICD-10-CM | POA: Diagnosis not present

## 2018-08-04 DIAGNOSIS — I482 Chronic atrial fibrillation, unspecified: Secondary | ICD-10-CM | POA: Diagnosis not present

## 2018-08-04 DIAGNOSIS — G2581 Restless legs syndrome: Secondary | ICD-10-CM | POA: Diagnosis not present

## 2018-08-04 DIAGNOSIS — J449 Chronic obstructive pulmonary disease, unspecified: Secondary | ICD-10-CM | POA: Diagnosis not present

## 2018-08-04 DIAGNOSIS — N3281 Overactive bladder: Secondary | ICD-10-CM | POA: Diagnosis not present

## 2018-08-04 DIAGNOSIS — F329 Major depressive disorder, single episode, unspecified: Secondary | ICD-10-CM | POA: Diagnosis not present

## 2018-08-04 DIAGNOSIS — K5732 Diverticulitis of large intestine without perforation or abscess without bleeding: Secondary | ICD-10-CM | POA: Diagnosis not present

## 2018-08-09 DIAGNOSIS — I10 Essential (primary) hypertension: Secondary | ICD-10-CM | POA: Diagnosis not present

## 2018-08-09 DIAGNOSIS — G2581 Restless legs syndrome: Secondary | ICD-10-CM | POA: Diagnosis not present

## 2018-08-09 DIAGNOSIS — F329 Major depressive disorder, single episode, unspecified: Secondary | ICD-10-CM | POA: Diagnosis not present

## 2018-08-09 DIAGNOSIS — E1151 Type 2 diabetes mellitus with diabetic peripheral angiopathy without gangrene: Secondary | ICD-10-CM | POA: Diagnosis not present

## 2018-08-09 DIAGNOSIS — J449 Chronic obstructive pulmonary disease, unspecified: Secondary | ICD-10-CM | POA: Diagnosis not present

## 2018-08-09 DIAGNOSIS — E1140 Type 2 diabetes mellitus with diabetic neuropathy, unspecified: Secondary | ICD-10-CM | POA: Diagnosis not present

## 2018-08-09 DIAGNOSIS — I482 Chronic atrial fibrillation, unspecified: Secondary | ICD-10-CM | POA: Diagnosis not present

## 2018-08-09 DIAGNOSIS — N3281 Overactive bladder: Secondary | ICD-10-CM | POA: Diagnosis not present

## 2018-08-09 DIAGNOSIS — K5732 Diverticulitis of large intestine without perforation or abscess without bleeding: Secondary | ICD-10-CM | POA: Diagnosis not present

## 2018-08-11 ENCOUNTER — Encounter: Payer: Self-pay | Admitting: Internal Medicine

## 2018-08-11 ENCOUNTER — Ambulatory Visit (INDEPENDENT_AMBULATORY_CARE_PROVIDER_SITE_OTHER): Payer: Medicare HMO | Admitting: Internal Medicine

## 2018-08-11 VITALS — BP 128/70 | HR 80 | Ht 60.0 in | Wt 135.0 lb

## 2018-08-11 DIAGNOSIS — R110 Nausea: Secondary | ICD-10-CM | POA: Diagnosis not present

## 2018-08-11 DIAGNOSIS — I482 Chronic atrial fibrillation, unspecified: Secondary | ICD-10-CM | POA: Diagnosis not present

## 2018-08-11 DIAGNOSIS — N3281 Overactive bladder: Secondary | ICD-10-CM | POA: Diagnosis not present

## 2018-08-11 DIAGNOSIS — J449 Chronic obstructive pulmonary disease, unspecified: Secondary | ICD-10-CM | POA: Diagnosis not present

## 2018-08-11 DIAGNOSIS — K580 Irritable bowel syndrome with diarrhea: Secondary | ICD-10-CM | POA: Diagnosis not present

## 2018-08-11 DIAGNOSIS — I10 Essential (primary) hypertension: Secondary | ICD-10-CM | POA: Diagnosis not present

## 2018-08-11 DIAGNOSIS — K529 Noninfective gastroenteritis and colitis, unspecified: Secondary | ICD-10-CM

## 2018-08-11 DIAGNOSIS — K5731 Diverticulosis of large intestine without perforation or abscess with bleeding: Secondary | ICD-10-CM

## 2018-08-11 DIAGNOSIS — E1140 Type 2 diabetes mellitus with diabetic neuropathy, unspecified: Secondary | ICD-10-CM | POA: Diagnosis not present

## 2018-08-11 DIAGNOSIS — G2581 Restless legs syndrome: Secondary | ICD-10-CM | POA: Diagnosis not present

## 2018-08-11 DIAGNOSIS — F329 Major depressive disorder, single episode, unspecified: Secondary | ICD-10-CM | POA: Diagnosis not present

## 2018-08-11 DIAGNOSIS — E1151 Type 2 diabetes mellitus with diabetic peripheral angiopathy without gangrene: Secondary | ICD-10-CM | POA: Diagnosis not present

## 2018-08-11 DIAGNOSIS — K5732 Diverticulitis of large intestine without perforation or abscess without bleeding: Secondary | ICD-10-CM | POA: Diagnosis not present

## 2018-08-11 NOTE — Patient Instructions (Signed)
   You have been given a testing kit to check for small intestine bacterial overgrowth (SIBO) which is completed by a company named [employer]. Make sure to return your test in the mail using the return mailing label given you along with the kit. Your demographic and insurance information have already been sent to the company and they should be in contact with you over the next week regarding this test. Please keep in mind that you will be getting a call from phone number [phone] or a similar number. If you do not hear from them within this time frame, please call our office at [phone].     I appreciate the opportunity to care for you. Carl Gessner, MD, FACG    

## 2018-08-11 NOTE — Progress Notes (Signed)
[name] [name] 84 y.o. May 17, 1930 [id number]  Assessment & Plan:   Encounter Diagnoses  Name Primary?  . Irritable bowel syndrome with diarrhea Yes  . Chronic nausea   . Chronic diarrhea   . Diverticulosis of colon with hemorrhage - resolved     It is possible she could have small intestinal bacterial overgrowth, chronic PPI therapy diabetes diverticulosis are all risk factors for that.  We will test for that though it might not be the [place] treatment might make a difference.  She takes warfarin so antibiotic prescribing could be tricky but we will cross that bridge when and if we come to it.  In the meantime she will continue with her regular therapy and follow-up will be arranged pending these results.  I appreciate the opportunity to care for this patient. CC: [name], MD  Subjective:   Chief Complaint: Follow-up after diverticular hemorrhage  HPI Patient is here with her husband as usual, for a follow-up after in August admission for GI bleeding thought to be due to diverticulosis with hemorrhage.  She had a colonoscopy by Dr. Bryan Lemma she had a 2 mm cecal polyps and hemorrhoids and diverticulosis.  She had a syncopal episode and a hemoglobin of 5 though after transfusion the hemoglobin went back to normal.  Her last hemoglobin in early September at discharge was normal, she was weak and went to rehab at a skilled nursing facility.  She had some insomnia issues when there and had some medications adjustments that made her sleep too much but she seems to be coming back to her normal state now at home for a couple of weeks.  She still suffers with a lot of nausea and abdominal cramping and very loose stools, especially in the morning such that it limits her activity and she has not been to church in some time.  Things are manageable with her current regimen but if there was a way to make that better she and her husband would like that.  Wt Readings from Last 3  Encounters:  08/11/18 135 lb (61.2 kg)  06/08/18 129 lb 3 oz (58.6 kg)  05/16/18 135 lb (61.2 kg)    Allergies  Allergen Reactions  . Lactose Intolerance (Gi) Nausea And Vomiting and Other (See Comments)    severe stomach pain  . Penicillins Hives and Itching    "haven't used any since 1970's" - have gotten cephalosporins multiple times Has patient had a PCN reaction causing immediate rash, facial/tongue/throat swelling, SOB or lightheadedness with hypotension: Yes Has patient had a PCN reaction causing severe rash involving mucus membranes or skin necrosis: Unk Has patient had a PCN reaction that required hospitalization: Unk Has patient had a PCN reaction occurring within the last 10 years: No If all of the above answers are "NO", then may proceed with C  . Sulfa Antibiotics Hives and Itching    "haven't used any since 1970's"  . Ciprofloxacin Swelling    Site of swelling not recalled  . Sulfasalazine Hives and Itching    "haven't used any since 1970's"   Current Meds  Medication Sig  . Carboxymethylcellulose Sod PF (EQ RESTORE PLUS LUBRICANT EYE) 0.5 % SOLN Apply 1 drop to eye daily as needed.  . diclofenac sodium (VOLTAREN) 1 % GEL Apply 2 g topically 4 (four) times daily. Apply to right ankle.  . diltiazem (DILT-XR) 120 MG 24 hr capsule TAKE 1 CAPSULE(120 MG) BY MOUTH DAILY (Patient taking differently: Take 120 mg by mouth daily.  TAKE 1 CAPSULE(120 MG) BY MOUTH DAILY)  . gabapentin (NEURONTIN) 300 MG capsule Take 300 mg by mouth daily.   Marland Kitchen glimepiride (AMARYL) 4 MG tablet Take 4 mg by mouth daily with breakfast.   . lactose free nutrition (BOOST) LIQD Take 237 mLs by mouth as needed.  Marland Kitchen LANTUS SOLOSTAR 100 UNIT/ML Solostar Pen Inject 22 Units into the skin daily.   Marland Kitchen latanoprost (XALATAN) 0.005 % ophthalmic solution Place 1 drop into both eyes at bedtime.  Marland Kitchen loperamide (LOPERAMIDE A-D) 2 MG tablet Take 1 tablet (2 mg total) by mouth as needed for diarrhea or loose stools (use  at bedtime). (Patient taking differently: Take 2 mg by mouth at bedtime. )  . LORazepam (ATIVAN) 1 MG tablet Take 1 tablet (1 mg total) by mouth every 8 (eight) hours as needed.  . metoprolol tartrate (LOPRESSOR) 25 MG tablet TAKE 1 TABLET(25 MG) BY MOUTH TWICE DAILY WITH FOOD (Patient taking differently: Take 25 mg by mouth 2 (two) times daily. )  . mirtazapine (REMERON) 7.5 MG tablet TAKE 1 TABLET(7.5 MG) BY MOUTH AT BEDTIME (Patient taking differently: Take 7.5 mg by mouth at bedtime. )  . Multiple Vitamin (MULTIVITAMIN WITH MINERALS) TABS Take 1 tablet by mouth daily.  Marland Kitchen oxybutynin (DITROPAN) 5 MG tablet Take 1 tablet by mouth at bedtime.  . promethazine (PHENERGAN) 25 MG tablet Take 25 mg by mouth 3 (three) times daily as needed for nausea or vomiting.  . SYMBICORT 160-4.5 MCG/ACT inhaler Inhale 2 puffs into the lungs 2 (two) times daily.   Marland Kitchen warfarin (COUMADIN) 5 MG tablet Take 0.5-1 tablets (2.5-5 mg total) by mouth daily. Take 1.25mg  every Sun/Wed then 2.5 mg the rest of the week (Patient taking differently: Take 2.5-5 mg by mouth daily. )   Past Medical History:  Diagnosis Date  . Atrial fibrillation (HCC)    RVR hx.  Chronic Coumadin  . C. difficile colitis 09/21/2013, 11/2013  . Carotid stenosis    a. Carotid US (10/2013):  bilat 1-39%; f/u 1 year  . Chronic lower back pain   . COPD (chronic obstructive pulmonary disease) ([place])   . Depression   . GERD (gastroesophageal reflux disease)    with HH  . Glaucoma   . HCAP (healthcare-associated pneumonia) 11/21/2011  . High cholesterol   . Hypertension   . Metabolic encephalopathy 05/05/8298  . Mitral regurgitation 03/11/2012  . Pancreatitis    ~2008, 11/2011 post ERCP  . Pneumonia 12/2011   "first time I know about"  . Sepsis ([place]) 11/27/2013  . SIRS (systemic inflammatory response syndrome) ([place]) 03/13/2012   a/w post ERCP  pancreatitis.   . Type II diabetes mellitus ([place])    Past Surgical History:  Procedure Laterality Date  .  APPENDECTOMY  ~ 1960  . CATARACT EXTRACTION W/ INTRAOCULAR LENS  IMPLANT, BILATERAL  2000's  . CHOLECYSTECTOMY  1990's   Lap chole  . COLONOSCOPY  01/2003   diverticulosis, 82mm hyperplastic sigmoid polyp  . COLONOSCOPY WITH PROPOFOL N/A 06/02/2018   Procedure: COLONOSCOPY WITH PROPOFOL;  Surgeon: Lavena Bullion, DO;  Location: [place];  Service: Gastroenterology;  Laterality: N/A;  . DILATION AND CURETTAGE OF UTERUS    . ERCP  11/13/2011   Procedure: ENDOSCOPIC RETROGRADE CHOLANGIOPANCREATOGRAPHY (ERCP);  Surgeon: Beryle Beams, MD;  Location[facility] ENDOSCOPY;  Service: Endoscopy;  Laterality: N/A;  . FLEXIBLE SIGMOIDOSCOPY N/A 05/02/2014   Procedure: FLEXIBLE SIGMOIDOSCOPY;  Surgeon: Gatha Mayer, MD;  Location: [facility] ENDOSCOPY;  Service: Endoscopy;  Laterality: N/A;  . HIP ARTHROPLASTY Left 01/07/2018   Procedure: ARTHROPLASTY BIPOLAR HIP (HEMIARTHROPLASTY);  Surgeon: Hiram Gash, MD;  Location: [place];  Service: Orthopedics;  Laterality: Left;  . TONSILLECTOMY AND ADENOIDECTOMY  ~ 1939  . VAGINAL HYSTERECTOMY  ~ 1960's   Social History   Social History Narrative   Lives at home with her husband.   No children   family history includes Stroke (age of onset: 73) in her mother; Stroke (age of onset: 3) in her father.   Review of Systems Walks using a rolling walker As above otherwise  Objective:   Physical Exam BP 128/70   Pulse 80   Ht 5' (1.524 m)   Wt 135 lb (61.2 kg)   BMI 26.37 kg/m  No acute distress  Reviewed includes hospital records to include labs discharge summary GI consult note colonoscopy note

## 2018-08-23 DIAGNOSIS — Z7901 Long term (current) use of anticoagulants: Secondary | ICD-10-CM | POA: Diagnosis not present

## 2018-08-23 DIAGNOSIS — I10 Essential (primary) hypertension: Secondary | ICD-10-CM | POA: Diagnosis not present

## 2018-08-23 DIAGNOSIS — Z6826 Body mass index (BMI) 26.0-26.9, adult: Secondary | ICD-10-CM | POA: Diagnosis not present

## 2018-08-23 DIAGNOSIS — G4700 Insomnia, unspecified: Secondary | ICD-10-CM | POA: Diagnosis not present

## 2018-08-23 DIAGNOSIS — G2581 Restless legs syndrome: Secondary | ICD-10-CM | POA: Diagnosis not present

## 2018-08-23 DIAGNOSIS — I482 Chronic atrial fibrillation, unspecified: Secondary | ICD-10-CM | POA: Diagnosis not present

## 2018-08-26 ENCOUNTER — Other Ambulatory Visit: Payer: Self-pay | Admitting: *Deleted

## 2018-08-26 NOTE — Patient Outreach (Signed)
[facility]) Care Management  08/26/2018  [name] [name] May 11, 1930 [id number]    RN spoke with pt's consented spouse [name] [name]) who indicated pt is having a "good day" however ongoing issues with occassional stomach and sleeping issues. RN has encouraged the caregiver to inform the pt's provider for interventions at the time these incidents occur.  RN reviewed the plan of care with all goals met as pt continue to recover with no acute issues and no needed resources at this time. Caregiver instructed this case will be closed based upon pt's progress with no needs to address at this time. RN has provider the caregiver with a contact number to call if pt is in need of [facility] assistance in the future. Case will be closed and provider notified.  [name] Mina, RN Care Management Coordinator [place] Office [phone]

## 2018-09-07 NOTE — Progress Notes (Signed)
HPI The patient presents for evaluation of atrial fibrillation.  She presents for routine follow-up.  She does not really feel the fibrillation.  She does not have any palpitations.  She does notice that she have some feeling of lightheadedness when she goes from a lying to a sitting position.  She is very limited and gets around in a wheelchair for any distance and a walker at home.  I do see that she was in the hospital in August with GI bleeding.  I reviewed this.  She had a diverticular bleed.  She briefly was off her warfarin but it was restarted before she left the hospital.  She had 4 units of blood.  Since then she has had no problems with her anticoagulation.  She is had no bleeding.  She has had problems with sleep and her meds have been manipulated although I do not have these office records.  It sounds like she is going to have mirtazapine removed from her meds and they are going to the pharmacy today to check on that order.  Her husband says she is also been on and off something for her restless legs.  Coincidently she had an episode of unresponsiveness last night.  Her husband says she was sitting on the couch.  She does not remember him getting up from the couch.  He says that she was able to answer some yes/no questions but she was "a little out of it".  He coded and near syncope.  Her blood pressure was okay.  Her pulse seemed to be okay.  She recovered relatively quickly.  She had one episode about 2 weeks ago walking in the hallway with her walker when her legs gave out and she had to ease herself to the ground but she has not had frank syncope or presyncope.   Allergies  Allergen Reactions  . Lactose Intolerance (Gi) Nausea And Vomiting and Other (See Comments)    severe stomach pain  . Penicillins Hives and Itching    "haven't used any since 1970's" - have gotten cephalosporins multiple times Has patient had a PCN reaction causing immediate rash, facial/tongue/throat swelling,  SOB or lightheadedness with hypotension: Yes Has patient had a PCN reaction causing severe rash involving mucus membranes or skin necrosis: Unk Has patient had a PCN reaction that required hospitalization: Unk Has patient had a PCN reaction occurring within the last 10 years: No If all of the above answers are "NO", then may proceed with C  . Sulfa Antibiotics Hives and Itching    "haven't used any since 1970's"  . Ciprofloxacin Swelling    Site of swelling not recalled  . Sulfasalazine Hives and Itching    "haven't used any since 1970's"   Prior to Admission medications   Medication Sig Start Date End Date Taking? Authorizing Provider  Carboxymethylcellulose Sod PF (EQ RESTORE PLUS LUBRICANT EYE) 0.5 % SOLN Apply 1 drop to eye daily as needed.   Yes [provider]  diclofenac sodium (VOLTAREN) 1 % GEL Apply 2 g topically 4 (four) times daily. Apply to right ankle. 06/07/18  Yes Arrien, Jimmy Picket, MD  diltiazem (DILT-XR) 120 MG 24 hr capsule TAKE 1 CAPSULE(120 MG) BY MOUTH DAILY Patient taking differently: Take 120 mg by mouth daily. TAKE 1 CAPSULE(120 MG) BY MOUTH DAILY 01/04/18  Yes Minus Breeding, MD  gabapentin (NEURONTIN) 300 MG capsule Take 300 mg by mouth daily.    Yes [provider]  glimepiride (AMARYL) 4  MG tablet Take 4 mg by mouth daily with breakfast.    Yes [provider]  lactose free nutrition (BOOST) LIQD Take 237 mLs by mouth as needed.   Yes [provider]  LANTUS SOLOSTAR 100 UNIT/ML Solostar Pen Inject 22 Units into the skin daily.  09/01/13  Yes [provider]  latanoprost (XALATAN) 0.005 % ophthalmic solution Place 1 drop into both eyes at bedtime.   Yes [provider]  loperamide (LOPERAMIDE A-D) 2 MG tablet Take 1 tablet (2 mg total) by mouth as needed for diarrhea or loose stools (use at bedtime). Patient taking differently: Take 2 mg by mouth at bedtime.  11/27/14  Yes Gatha Mayer, MD  LORazepam  (ATIVAN) 1 MG tablet Take 1 tablet (1 mg total) by mouth every 8 (eight) hours as needed. 06/08/18  Yes Reyne Dumas, MD  metoprolol tartrate (LOPRESSOR) 25 MG tablet TAKE 1 TABLET(25 MG) BY MOUTH TWICE DAILY WITH FOOD Patient taking differently: Take 25 mg by mouth 2 (two) times daily.  12/13/17  Yes Minus Breeding, MD  mirtazapine (REMERON) 7.5 MG tablet TAKE 1 TABLET(7.5 MG) BY MOUTH AT BEDTIME Patient taking differently: Take 7.5 mg by mouth at bedtime.  10/06/16  Yes Gatha Mayer, MD  Multiple Vitamin (MULTIVITAMIN WITH MINERALS) TABS Take 1 tablet by mouth daily.   Yes [provider]  oxybutynin (DITROPAN) 5 MG tablet Take 1 tablet by mouth at bedtime. 07/29/16  Yes [provider]  promethazine (PHENERGAN) 25 MG tablet Take 25 mg by mouth 3 (three) times daily as needed for nausea or vomiting.   Yes [provider]  SYMBICORT 160-4.5 MCG/ACT inhaler Inhale 2 puffs into the lungs 2 (two) times daily.  03/08/14  Yes [provider]  warfarin (COUMADIN) 5 MG tablet Take 0.5-1 tablets (2.5-5 mg total) by mouth daily. Take 1.25mg  every Sun/Wed then 2.5 mg the rest of the week Patient taking differently: Take 2.5-5 mg by mouth daily.  01/12/18  Yes Mikhail, Maryann, DO  pantoprazole (PROTONIX) 40 MG tablet Take 1 tablet (40 mg total) by mouth 2 (two) times daily. 06/07/18 07/07/18  Arrien, Jimmy Picket, MD    Past Medical History:  Diagnosis Date  . Atrial fibrillation (HCC)    RVR hx.  Chronic Coumadin  . C. difficile colitis 09/21/2013, 11/2013  . Carotid stenosis    a. Carotid US (10/2013):  bilat 1-39%; f/u 1 year  . Chronic lower back pain   . COPD (chronic obstructive pulmonary disease) ([place])   . Depression   . Diverticulosis of colon without hemorrhage   . GERD (gastroesophageal reflux disease)    with HH  . Glaucoma   . HCAP (healthcare-associated pneumonia) 11/21/2011  . High cholesterol   . Hypertension   . Metabolic encephalopathy 11/07/7626  .  Mitral regurgitation 03/11/2012  . Pancreatitis    ~2008, 11/2011 post ERCP  . Pneumonia 12/2011   "first time I know about"  . Sepsis ([place]) 11/27/2013  . SIRS (systemic inflammatory response syndrome) ([place]) 03/13/2012   a/w post ERCP  pancreatitis.   . Syncope   . Type II diabetes mellitus ([place])     Past Surgical History:  Procedure Laterality Date  . APPENDECTOMY  ~ 1960  . CATARACT EXTRACTION W/ INTRAOCULAR LENS  IMPLANT, BILATERAL  2000's  . CHOLECYSTECTOMY  1990's   Lap chole  . COLONOSCOPY  01/2003   diverticulosis, 38mm hyperplastic sigmoid polyp  . COLONOSCOPY WITH PROPOFOL N/A 06/02/2018   Procedure:  COLONOSCOPY WITH PROPOFOL;  Surgeon: Lavena Bullion, DO;  Location: [facility];  Service: Gastroenterology;  Laterality: N/A;  . DILATION AND CURETTAGE OF UTERUS    . ERCP  11/13/2011   Procedure: ENDOSCOPIC RETROGRADE CHOLANGIOPANCREATOGRAPHY (ERCP);  Surgeon: Beryle Beams, MD;  Location: [facility] ENDOSCOPY;  Service: Endoscopy;  Laterality: N/A;  . FLEXIBLE SIGMOIDOSCOPY N/A 05/02/2014   Procedure: FLEXIBLE SIGMOIDOSCOPY;  Surgeon: Gatha Mayer, MD;  [facility];  Service: Endoscopy;  Laterality: N/A;  . HIP ARTHROPLASTY Left 01/07/2018   Procedure: ARTHROPLASTY BIPOLAR HIP (HEMIARTHROPLASTY);  Surgeon: Hiram Gash, MD;  Location: [place];  Service: Orthopedics;  Laterality: Left;  . TONSILLECTOMY AND ADENOIDECTOMY  ~ 1939  . VAGINAL HYSTERECTOMY  ~ 1960's    ROS:  As stated in the HPI and negative for all other systems.   PHYSICAL EXAM BP 130/64   Pulse 100   Ht 5\' 1"  (1.549 m)   Wt 136 lb 6.4 oz (61.9 kg)   BMI 25.77 kg/m   GENERAL:  Frail appearing NECK:  No jugular venous distention, waveform within normal limits, carotid upstroke brisk and symmetric, no bruits, no thyromegaly LUNGS:  Clear to auscultation bilaterally CHEST:  Unremarkable HEART:  PMI not displaced or sustained,S1 and S2 within normal limits, no O6,ZT clicks, no rubs, no murmurs,  irregular ABD:  Flat, positive bowel sounds normal in frequency in pitch, no bruits, no rebound, no guarding, no midline pulsatile mass, no hepatomegaly, no splenomegaly EXT:  2 plus pulses throughout, no edema, no cyanosis no clubbing   EKG:  Atrial fibrillation, rate 94, axis within normal limits, intervals within normal limits, no acute ST-T wave changes lower voltage in limb leads. 06/01/18   ASSESSMENT AND PLAN   Atrial fibrillation -  [name] has a CHA2DS2 - VASc score of 4 with a risk of stroke of 4%.  She tolerates her anticoagulation despite the GI bleed earlier this year.  No change in therapy.  Presyncope - The patient has presyncope as described.  I am to reduce her metoprolol to 12 and half milligrams twice daily.  HTN (hypertension) -  Meds will be  changed as above.  She needs permissive hypertension.  Mitral regurgitation -  This was mild on echo in 2458 and certainly she would have this managed conservatively.  No further imaging is indicated.  This was mild on echo in 2015.  I would not suspect that this was different clinically.  No change in therapy is indicated and   Carotid stenosis - This was mild in 2016.  No repeat testing is indicated.

## 2018-09-08 DIAGNOSIS — I482 Chronic atrial fibrillation, unspecified: Secondary | ICD-10-CM | POA: Diagnosis not present

## 2018-09-08 DIAGNOSIS — R2681 Unsteadiness on feet: Secondary | ICD-10-CM | POA: Diagnosis not present

## 2018-09-08 DIAGNOSIS — Z7901 Long term (current) use of anticoagulants: Secondary | ICD-10-CM | POA: Diagnosis not present

## 2018-09-08 DIAGNOSIS — I10 Essential (primary) hypertension: Secondary | ICD-10-CM | POA: Diagnosis not present

## 2018-09-08 DIAGNOSIS — Z6826 Body mass index (BMI) 26.0-26.9, adult: Secondary | ICD-10-CM | POA: Diagnosis not present

## 2018-09-08 DIAGNOSIS — E538 Deficiency of other specified B group vitamins: Secondary | ICD-10-CM | POA: Diagnosis not present

## 2018-09-08 DIAGNOSIS — E1151 Type 2 diabetes mellitus with diabetic peripheral angiopathy without gangrene: Secondary | ICD-10-CM | POA: Diagnosis not present

## 2018-09-08 DIAGNOSIS — G4709 Other insomnia: Secondary | ICD-10-CM | POA: Diagnosis not present

## 2018-09-08 DIAGNOSIS — G2581 Restless legs syndrome: Secondary | ICD-10-CM | POA: Diagnosis not present

## 2018-09-09 ENCOUNTER — Ambulatory Visit (INDEPENDENT_AMBULATORY_CARE_PROVIDER_SITE_OTHER): Payer: Medicare HMO | Admitting: Cardiology

## 2018-09-09 ENCOUNTER — Encounter: Payer: Self-pay | Admitting: Cardiology

## 2018-09-09 ENCOUNTER — Ambulatory Visit: Payer: Medicare HMO | Admitting: Cardiology

## 2018-09-09 VITALS — BP 130/64 | HR 100 | Ht 61.0 in | Wt 136.4 lb

## 2018-09-09 DIAGNOSIS — I10 Essential (primary) hypertension: Secondary | ICD-10-CM

## 2018-09-09 DIAGNOSIS — I482 Chronic atrial fibrillation, unspecified: Secondary | ICD-10-CM | POA: Diagnosis not present

## 2018-09-09 DIAGNOSIS — R42 Dizziness and giddiness: Secondary | ICD-10-CM | POA: Diagnosis not present

## 2018-09-09 NOTE — Patient Instructions (Signed)
Medication Instructions:  DECREASE- Metoprolol 12.5 mg twice a day  If you need a refill on your cardiac medications before your next appointment, please call your pharmacy.  Labwork: None Ordered    If you have labs (blood work) drawn today and your tests are completely normal, you will receive your results only by: Marland Kitchen MyChart Message (if you have MyChart) OR . A paper copy in the mail If you have any lab test that is abnormal or we need to change your treatment, we will call you to review the results.  Testing/Procedures: None Ordered  Follow-Up: You will need a follow up appointment in 1 Year.  Please call our office 2 months in advance([phone]) to schedule the (1 Year) appointment.  You may see  DR Percival Spanish, or one of the following Advanced Practice Providers on your designated Care Team:    . Rosaria Ferries, PA-C  -[place], DNP, ANP  . Kerin Ransom, [place]  . Almyra Deforest, [place], PA-C  At [facility], you and your health needs are our priority.  As part of our continuing mission to provide you with exceptional heart care, we have created designated Provider Care Teams.  These Care Teams include your primary Cardiologist (physician) and Advanced Practice Providers (APPs -  Physician Assistants and Nurse Practitioners) who all work together to provide you with the care you need, when you need it.   Thank you for choosing [facility] at [facility]!!

## 2018-09-20 DIAGNOSIS — R2681 Unsteadiness on feet: Secondary | ICD-10-CM | POA: Diagnosis not present

## 2018-09-20 DIAGNOSIS — I4821 Permanent atrial fibrillation: Secondary | ICD-10-CM | POA: Diagnosis not present

## 2018-09-20 DIAGNOSIS — Z7901 Long term (current) use of anticoagulants: Secondary | ICD-10-CM | POA: Diagnosis not present

## 2018-09-20 DIAGNOSIS — Z6826 Body mass index (BMI) 26.0-26.9, adult: Secondary | ICD-10-CM | POA: Diagnosis not present

## 2018-10-04 DIAGNOSIS — R82998 Other abnormal findings in urine: Secondary | ICD-10-CM | POA: Diagnosis not present

## 2018-10-04 DIAGNOSIS — I10 Essential (primary) hypertension: Secondary | ICD-10-CM | POA: Diagnosis not present

## 2018-10-04 DIAGNOSIS — E1151 Type 2 diabetes mellitus with diabetic peripheral angiopathy without gangrene: Secondary | ICD-10-CM | POA: Diagnosis not present

## 2018-10-05 DIAGNOSIS — R7881 Bacteremia: Secondary | ICD-10-CM

## 2018-10-05 HISTORY — DX: Bacteremia: R78.81

## 2018-10-06 DIAGNOSIS — E785 Hyperlipidemia, unspecified: Secondary | ICD-10-CM | POA: Diagnosis not present

## 2018-10-06 DIAGNOSIS — D6489 Other specified anemias: Secondary | ICD-10-CM | POA: Diagnosis not present

## 2018-10-06 DIAGNOSIS — Z0000 Encounter for general adult medical examination without abnormal findings: Secondary | ICD-10-CM | POA: Diagnosis not present

## 2018-10-06 DIAGNOSIS — R82998 Other abnormal findings in urine: Secondary | ICD-10-CM | POA: Diagnosis not present

## 2018-10-11 DIAGNOSIS — I4821 Permanent atrial fibrillation: Secondary | ICD-10-CM | POA: Diagnosis not present

## 2018-10-11 DIAGNOSIS — R2681 Unsteadiness on feet: Secondary | ICD-10-CM | POA: Diagnosis not present

## 2018-10-11 DIAGNOSIS — M79671 Pain in right foot: Secondary | ICD-10-CM | POA: Diagnosis not present

## 2018-10-11 DIAGNOSIS — I10 Essential (primary) hypertension: Secondary | ICD-10-CM | POA: Diagnosis not present

## 2018-10-11 DIAGNOSIS — Z0000 Encounter for general adult medical examination without abnormal findings: Secondary | ICD-10-CM | POA: Diagnosis not present

## 2018-10-11 DIAGNOSIS — E1151 Type 2 diabetes mellitus with diabetic peripheral angiopathy without gangrene: Secondary | ICD-10-CM | POA: Diagnosis not present

## 2018-10-11 DIAGNOSIS — G2581 Restless legs syndrome: Secondary | ICD-10-CM | POA: Diagnosis not present

## 2018-10-11 DIAGNOSIS — Z7901 Long term (current) use of anticoagulants: Secondary | ICD-10-CM | POA: Diagnosis not present

## 2018-10-11 DIAGNOSIS — E7849 Other hyperlipidemia: Secondary | ICD-10-CM | POA: Diagnosis not present

## 2018-10-11 DIAGNOSIS — R1310 Dysphagia, unspecified: Secondary | ICD-10-CM | POA: Insufficient documentation

## 2018-10-11 HISTORY — DX: Pain in right foot: M79.671

## 2018-10-12 ENCOUNTER — Other Ambulatory Visit (HOSPITAL_COMMUNITY): Payer: Self-pay | Admitting: Internal Medicine

## 2018-10-12 ENCOUNTER — Other Ambulatory Visit: Payer: Self-pay | Admitting: Internal Medicine

## 2018-10-12 DIAGNOSIS — R1310 Dysphagia, unspecified: Secondary | ICD-10-CM

## 2018-10-13 DIAGNOSIS — S92344A Nondisplaced fracture of fourth metatarsal bone, right foot, initial encounter for closed fracture: Secondary | ICD-10-CM | POA: Diagnosis not present

## 2018-10-13 DIAGNOSIS — Z1212 Encounter for screening for malignant neoplasm of rectum: Secondary | ICD-10-CM | POA: Diagnosis not present

## 2018-10-13 DIAGNOSIS — S92334A Nondisplaced fracture of third metatarsal bone, right foot, initial encounter for closed fracture: Secondary | ICD-10-CM | POA: Diagnosis not present

## 2018-10-21 ENCOUNTER — Ambulatory Visit (HOSPITAL_COMMUNITY)
Admission: RE | Admit: 2018-10-21 | Discharge: 2018-10-21 | Disposition: A | Discharge status: Home / Self Care | Payer: Medicare HMO | Source: Ambulatory Visit | Attending: Internal Medicine | Admitting: Internal Medicine

## 2018-10-21 DIAGNOSIS — R1310 Dysphagia, unspecified: Secondary | ICD-10-CM | POA: Diagnosis not present

## 2018-10-24 ENCOUNTER — Telehealth: Payer: Self-pay | Admitting: Podiatry

## 2018-10-24 NOTE — Telephone Encounter (Signed)
I spoke with pt's husband, [name], and informed that if the boot could be taken off, we would be able to trim the toenails and help put the boot back one, but advised [name] to have pt tell the doctor where she was tender. [name] states understanding.

## 2018-10-24 NOTE — Telephone Encounter (Signed)
Pt is scheduled for appt tomorrow 1/21 to have her toenails trimmed and has had a fall recently that fractured her right foot. Pt is not having to wear her boot all the time but wears it to get around. Pt wants to know if this will interfere with her toenail trimming tomorrow. Please give husband a call back.

## 2018-10-25 ENCOUNTER — Other Ambulatory Visit: Payer: Self-pay

## 2018-10-25 ENCOUNTER — Ambulatory Visit: Payer: Medicare HMO | Admitting: Podiatry

## 2018-10-25 ENCOUNTER — Encounter (HOSPITAL_COMMUNITY): Payer: Self-pay | Admitting: Emergency Medicine

## 2018-10-25 ENCOUNTER — Inpatient Hospital Stay (HOSPITAL_COMMUNITY)
Admission: EM | Admit: 2018-10-25 | Discharge: 2018-11-05 | DRG: 871 | Disposition: A | Discharge status: Skilled Nursing Facility | Payer: Medicare HMO | Attending: Internal Medicine | Admitting: Internal Medicine

## 2018-10-25 ENCOUNTER — Emergency Department (HOSPITAL_COMMUNITY): Payer: Medicare HMO

## 2018-10-25 DIAGNOSIS — R6510 Systemic inflammatory response syndrome (SIRS) of non-infectious origin without acute organ dysfunction: Secondary | ICD-10-CM | POA: Diagnosis not present

## 2018-10-25 DIAGNOSIS — K219 Gastro-esophageal reflux disease without esophagitis: Secondary | ICD-10-CM | POA: Diagnosis present

## 2018-10-25 DIAGNOSIS — J9811 Atelectasis: Secondary | ICD-10-CM | POA: Diagnosis not present

## 2018-10-25 DIAGNOSIS — N3000 Acute cystitis without hematuria: Secondary | ICD-10-CM | POA: Diagnosis not present

## 2018-10-25 DIAGNOSIS — I4891 Unspecified atrial fibrillation: Secondary | ICD-10-CM | POA: Diagnosis present

## 2018-10-25 DIAGNOSIS — S92909D Unspecified fracture of unspecified foot, subsequent encounter for fracture with routine healing: Secondary | ICD-10-CM | POA: Diagnosis not present

## 2018-10-25 DIAGNOSIS — Z794 Long term (current) use of insulin: Secondary | ICD-10-CM

## 2018-10-25 DIAGNOSIS — Z9049 Acquired absence of other specified parts of digestive tract: Secondary | ICD-10-CM

## 2018-10-25 DIAGNOSIS — Z7901 Long term (current) use of anticoagulants: Secondary | ICD-10-CM | POA: Diagnosis not present

## 2018-10-25 DIAGNOSIS — G92 Toxic encephalopathy: Secondary | ICD-10-CM | POA: Diagnosis not present

## 2018-10-25 DIAGNOSIS — H409 Unspecified glaucoma: Secondary | ICD-10-CM | POA: Diagnosis present

## 2018-10-25 DIAGNOSIS — E860 Dehydration: Secondary | ICD-10-CM | POA: Diagnosis present

## 2018-10-25 DIAGNOSIS — I10 Essential (primary) hypertension: Secondary | ICD-10-CM | POA: Diagnosis present

## 2018-10-25 DIAGNOSIS — Z9841 Cataract extraction status, right eye: Secondary | ICD-10-CM

## 2018-10-25 DIAGNOSIS — F419 Anxiety disorder, unspecified: Secondary | ICD-10-CM | POA: Diagnosis present

## 2018-10-25 DIAGNOSIS — G8929 Other chronic pain: Secondary | ICD-10-CM | POA: Diagnosis present

## 2018-10-25 DIAGNOSIS — Z7951 Long term (current) use of inhaled steroids: Secondary | ICD-10-CM

## 2018-10-25 DIAGNOSIS — Z961 Presence of intraocular lens: Secondary | ICD-10-CM | POA: Diagnosis present

## 2018-10-25 DIAGNOSIS — N390 Urinary tract infection, site not specified: Secondary | ICD-10-CM | POA: Diagnosis not present

## 2018-10-25 DIAGNOSIS — M545 Low back pain: Secondary | ICD-10-CM | POA: Diagnosis present

## 2018-10-25 DIAGNOSIS — Z8701 Personal history of pneumonia (recurrent): Secondary | ICD-10-CM

## 2018-10-25 DIAGNOSIS — W19XXXD Unspecified fall, subsequent encounter: Secondary | ICD-10-CM | POA: Diagnosis present

## 2018-10-25 DIAGNOSIS — E119 Type 2 diabetes mellitus without complications: Secondary | ICD-10-CM | POA: Diagnosis not present

## 2018-10-25 DIAGNOSIS — E739 Lactose intolerance, unspecified: Secondary | ICD-10-CM | POA: Diagnosis present

## 2018-10-25 DIAGNOSIS — R7881 Bacteremia: Secondary | ICD-10-CM

## 2018-10-25 DIAGNOSIS — A419 Sepsis, unspecified organism: Secondary | ICD-10-CM

## 2018-10-25 DIAGNOSIS — G9340 Encephalopathy, unspecified: Secondary | ICD-10-CM

## 2018-10-25 DIAGNOSIS — Z882 Allergy status to sulfonamides status: Secondary | ICD-10-CM

## 2018-10-25 DIAGNOSIS — R32 Unspecified urinary incontinence: Secondary | ICD-10-CM | POA: Diagnosis present

## 2018-10-25 DIAGNOSIS — R509 Fever, unspecified: Secondary | ICD-10-CM | POA: Diagnosis not present

## 2018-10-25 DIAGNOSIS — R319 Hematuria, unspecified: Secondary | ICD-10-CM | POA: Diagnosis not present

## 2018-10-25 DIAGNOSIS — R6520 Severe sepsis without septic shock: Secondary | ICD-10-CM | POA: Diagnosis not present

## 2018-10-25 DIAGNOSIS — J189 Pneumonia, unspecified organism: Secondary | ICD-10-CM

## 2018-10-25 DIAGNOSIS — R0600 Dyspnea, unspecified: Secondary | ICD-10-CM | POA: Diagnosis not present

## 2018-10-25 DIAGNOSIS — R197 Diarrhea, unspecified: Secondary | ICD-10-CM | POA: Diagnosis not present

## 2018-10-25 DIAGNOSIS — Z90710 Acquired absence of both cervix and uterus: Secondary | ICD-10-CM

## 2018-10-25 DIAGNOSIS — R0902 Hypoxemia: Secondary | ICD-10-CM | POA: Diagnosis not present

## 2018-10-25 DIAGNOSIS — E1140 Type 2 diabetes mellitus with diabetic neuropathy, unspecified: Secondary | ICD-10-CM | POA: Diagnosis present

## 2018-10-25 DIAGNOSIS — I959 Hypotension, unspecified: Secondary | ICD-10-CM | POA: Diagnosis not present

## 2018-10-25 DIAGNOSIS — A4151 Sepsis due to Escherichia coli [E. coli]: Principal | ICD-10-CM | POA: Diagnosis present

## 2018-10-25 DIAGNOSIS — R1312 Dysphagia, oropharyngeal phase: Secondary | ICD-10-CM | POA: Diagnosis not present

## 2018-10-25 DIAGNOSIS — Z9181 History of falling: Secondary | ICD-10-CM

## 2018-10-25 DIAGNOSIS — A409 Streptococcal sepsis, unspecified: Secondary | ICD-10-CM | POA: Diagnosis not present

## 2018-10-25 DIAGNOSIS — I482 Chronic atrial fibrillation, unspecified: Secondary | ICD-10-CM | POA: Diagnosis not present

## 2018-10-25 DIAGNOSIS — R4182 Altered mental status, unspecified: Secondary | ICD-10-CM | POA: Diagnosis not present

## 2018-10-25 DIAGNOSIS — R110 Nausea: Secondary | ICD-10-CM | POA: Diagnosis not present

## 2018-10-25 DIAGNOSIS — E8770 Fluid overload, unspecified: Secondary | ICD-10-CM | POA: Diagnosis not present

## 2018-10-25 DIAGNOSIS — Z881 Allergy status to other antibiotic agents status: Secondary | ICD-10-CM

## 2018-10-25 DIAGNOSIS — Z79899 Other long term (current) drug therapy: Secondary | ICD-10-CM

## 2018-10-25 DIAGNOSIS — Z9842 Cataract extraction status, left eye: Secondary | ICD-10-CM

## 2018-10-25 DIAGNOSIS — J449 Chronic obstructive pulmonary disease, unspecified: Secondary | ICD-10-CM | POA: Diagnosis present

## 2018-10-25 DIAGNOSIS — B952 Enterococcus as the cause of diseases classified elsewhere: Secondary | ICD-10-CM | POA: Diagnosis present

## 2018-10-25 DIAGNOSIS — E872 Acidosis: Secondary | ICD-10-CM | POA: Diagnosis not present

## 2018-10-25 DIAGNOSIS — M6281 Muscle weakness (generalized): Secondary | ICD-10-CM | POA: Diagnosis not present

## 2018-10-25 DIAGNOSIS — Z880 Allergy status to penicillin: Secondary | ICD-10-CM

## 2018-10-25 DIAGNOSIS — R278 Other lack of coordination: Secondary | ICD-10-CM | POA: Diagnosis not present

## 2018-10-25 DIAGNOSIS — R1319 Other dysphagia: Secondary | ICD-10-CM | POA: Diagnosis not present

## 2018-10-25 DIAGNOSIS — R41841 Cognitive communication deficit: Secondary | ICD-10-CM | POA: Diagnosis not present

## 2018-10-25 HISTORY — DX: Bacteremia: R78.81

## 2018-10-25 LAB — COMPREHENSIVE METABOLIC PANEL
ALT: 18 U/L (ref 0–44)
AST: 33 U/L (ref 15–41)
Albumin: 3.5 g/dL (ref 3.5–5.0)
Alkaline Phosphatase: 89 U/L (ref 38–126)
Anion gap: 14 (ref 5–15)
BILIRUBIN TOTAL: 1.2 mg/dL (ref 0.3–1.2)
BUN: 13 mg/dL (ref 8–23)
CO2: 17 mmol/L — ABNORMAL LOW (ref 22–32)
CREATININE: 1.14 mg/dL — AB (ref 0.44–1.00)
Calcium: 9.4 mg/dL (ref 8.9–10.3)
Chloride: 106 mmol/L (ref 98–111)
GFR calc Af Amer: 50 mL/min — ABNORMAL LOW (ref >60)
GFR calc non Af Amer: 44 mL/min — ABNORMAL LOW (ref >60)
Glucose, Bld: 207 mg/dL — ABNORMAL HIGH (ref 70–99)
Potassium: 3.7 mmol/L (ref 3.5–5.1)
Sodium: 137 mmol/L (ref 135–145)
TOTAL PROTEIN: 7.2 g/dL (ref 6.5–8.1)

## 2018-10-25 LAB — CBC WITH DIFFERENTIAL/PLATELET
ABS IMMATURE GRANULOCYTES: 0.14 10*3/uL — AB (ref 0.00–0.07)
BASOS ABS: 0.1 10*3/uL (ref 0.0–0.1)
Basophils Relative: 0 %
Eosinophils Absolute: 0 10*3/uL (ref 0.0–0.5)
Eosinophils Relative: 0 %
HEMATOCRIT: 44.8 % (ref 36.0–46.0)
HEMOGLOBIN: 14.4 g/dL (ref 12.0–15.0)
IMMATURE GRANULOCYTES: 1 %
LYMPHS PCT: 3 %
Lymphs Abs: 0.7 10*3/uL (ref 0.7–4.0)
MCH: 30.2 pg (ref 26.0–34.0)
MCHC: 32.1 g/dL (ref 30.0–36.0)
MCV: 93.9 fL (ref 80.0–100.0)
Monocytes Absolute: 0.6 10*3/uL (ref 0.1–1.0)
Monocytes Relative: 3 %
NEUTROS ABS: 19.1 10*3/uL — AB (ref 1.7–7.7)
NEUTROS PCT: 93 %
NRBC: 0 % (ref 0.0–0.2)
Platelets: 245 10*3/uL (ref 150–400)
RBC: 4.77 MIL/uL (ref 3.87–5.11)
RDW: 14.2 % (ref 11.5–15.5)
WBC: 20.6 10*3/uL — ABNORMAL HIGH (ref 4.0–10.5)

## 2018-10-25 LAB — URINALYSIS, ROUTINE W REFLEX MICROSCOPIC
Bilirubin Urine: NEGATIVE
GLUCOSE, UA: NEGATIVE mg/dL
Ketones, ur: NEGATIVE mg/dL
NITRITE: POSITIVE — AB
PH: 5 (ref 5.0–8.0)
Protein, ur: 100 mg/dL — AB
Specific Gravity, Urine: 1.011 (ref 1.005–1.030)
Trans Epithel, UA: 2

## 2018-10-25 LAB — LACTIC ACID, PLASMA
Lactic Acid, Venous: 2 mmol/L (ref 0.5–1.9)
Lactic Acid, Venous: 2.2 mmol/L (ref 0.5–1.9)

## 2018-10-25 LAB — PROTIME-INR
INR: 1.93
Prothrombin Time: 21.8 seconds — ABNORMAL HIGH (ref 11.4–15.2)

## 2018-10-25 LAB — GLUCOSE, CAPILLARY: Glucose-Capillary: 185 mg/dL — ABNORMAL HIGH (ref 70–99)

## 2018-10-25 MED ORDER — ADULT MULTIVITAMIN W/MINERALS CH
1.0000 | ORAL_TABLET | Freq: Every day | ORAL | Status: DC
  Administered 2018-10-26 – 2018-11-05 (×11): 1 via ORAL
  Filled 2018-10-25 (×11): qty 1

## 2018-10-25 MED ORDER — GLIMEPIRIDE 4 MG PO TABS: 4.0000 mg | ORAL_TABLET | Freq: Every day | ORAL | Status: DC

## 2018-10-25 MED ORDER — ONDANSETRON HCL 4 MG PO TABS: 4.0000 mg | ORAL_TABLET | Freq: Four times a day (QID) | ORAL | Status: DC | PRN

## 2018-10-25 MED ORDER — ZOLPIDEM TARTRATE 5 MG PO TABS
5.0000 mg | ORAL_TABLET | Freq: Once | ORAL | Status: AC
  Administered 2018-10-25: 5 mg via ORAL
  Filled 2018-10-25: qty 1

## 2018-10-25 MED ORDER — PANTOPRAZOLE SODIUM 40 MG PO TBEC
40.0000 mg | DELAYED_RELEASE_TABLET | Freq: Two times a day (BID) | ORAL | Status: DC
  Administered 2018-10-25 – 2018-11-05 (×22): 40 mg via ORAL
  Filled 2018-10-25 (×22): qty 1

## 2018-10-25 MED ORDER — SODIUM CHLORIDE 0.9 % IV SOLN: 1.0000 g | INTRAVENOUS | Status: DC

## 2018-10-25 MED ORDER — WARFARIN SODIUM 2.5 MG PO TABS
2.5000 mg | ORAL_TABLET | Freq: Once | ORAL | Status: AC
  Administered 2018-10-25: 2.5 mg via ORAL
  Filled 2018-10-25 (×2): qty 1

## 2018-10-25 MED ORDER — SODIUM CHLORIDE 0.9 % IV SOLN: 1.0000 g | Freq: Once | INTRAVENOUS | Status: DC

## 2018-10-25 MED ORDER — ALBUTEROL SULFATE (2.5 MG/3ML) 0.083% IN NEBU: 2.5000 mg | INHALATION_SOLUTION | RESPIRATORY_TRACT | Status: DC | PRN

## 2018-10-25 MED ORDER — SODIUM CHLORIDE 0.9 % IV SOLN
INTRAVENOUS | Status: DC
  Administered 2018-10-25 – 2018-10-29 (×6): via INTRAVENOUS

## 2018-10-25 MED ORDER — ONDANSETRON HCL 4 MG/2ML IJ SOLN: 4.0000 mg | Freq: Four times a day (QID) | INTRAMUSCULAR | Status: DC | PRN

## 2018-10-25 MED ORDER — INSULIN GLARGINE 100 UNIT/ML ~~LOC~~ SOLN
10.0000 [IU] | Freq: Every day | SUBCUTANEOUS | Status: DC
  Filled 2018-10-25: qty 0.1

## 2018-10-25 MED ORDER — DILTIAZEM HCL ER COATED BEADS 120 MG PO CP24
120.0000 mg | ORAL_CAPSULE | Freq: Every day | ORAL | Status: DC
  Administered 2018-10-26 – 2018-11-05 (×11): 120 mg via ORAL
  Filled 2018-10-25 (×12): qty 1

## 2018-10-25 MED ORDER — GABAPENTIN 300 MG PO CAPS
300.0000 mg | ORAL_CAPSULE | Freq: Every day | ORAL | Status: DC
  Administered 2018-10-26 – 2018-11-05 (×11): 300 mg via ORAL
  Filled 2018-10-25 (×11): qty 1

## 2018-10-25 MED ORDER — SODIUM CHLORIDE 0.9 % IV SOLN
INTRAVENOUS | Status: DC
  Administered 2018-10-25: 17:00:00 via INTRAVENOUS

## 2018-10-25 MED ORDER — INSULIN GLARGINE 100 UNIT/ML SOLOSTAR PEN: 22.0000 [IU] | PEN_INJECTOR | Freq: Every day | SUBCUTANEOUS | Status: DC

## 2018-10-25 MED ORDER — DILTIAZEM HCL-DEXTROSE 100-5 MG/100ML-% IV SOLN (PREMIX)
5.0000 mg/h | INTRAVENOUS | Status: DC
  Administered 2018-10-25 – 2018-10-26 (×2): 5 mg/h via INTRAVENOUS
  Filled 2018-10-25 (×2): qty 100

## 2018-10-25 MED ORDER — SODIUM CHLORIDE 0.9 % IV BOLUS
500.0000 mL | Freq: Once | INTRAVENOUS | Status: AC
  Administered 2018-10-25: 500 mL via INTRAVENOUS

## 2018-10-25 MED ORDER — OXYBUTYNIN CHLORIDE 5 MG PO TABS
5.0000 mg | ORAL_TABLET | Freq: Every day | ORAL | Status: DC
  Administered 2018-10-25 – 2018-11-04 (×11): 5 mg via ORAL
  Filled 2018-10-25 (×11): qty 1

## 2018-10-25 MED ORDER — SODIUM CHLORIDE 0.9 % IV SOLN
1.0000 g | Freq: Once | INTRAVENOUS | Status: AC
  Administered 2018-10-25: 1 g via INTRAVENOUS
  Filled 2018-10-25: qty 10

## 2018-10-25 MED ORDER — ONDANSETRON HCL 4 MG/2ML IJ SOLN
4.0000 mg | Freq: Once | INTRAMUSCULAR | Status: AC
  Administered 2018-10-25: 4 mg via INTRAVENOUS
  Filled 2018-10-25: qty 2

## 2018-10-25 MED ORDER — MOMETASONE FURO-FORMOTEROL FUM 200-5 MCG/ACT IN AERO
2.0000 | INHALATION_SPRAY | Freq: Two times a day (BID) | RESPIRATORY_TRACT | Status: DC
  Administered 2018-10-26 – 2018-11-05 (×20): 2 via RESPIRATORY_TRACT
  Filled 2018-10-25: qty 8.8

## 2018-10-25 MED ORDER — ACETAMINOPHEN 325 MG PO TABS
650.0000 mg | ORAL_TABLET | Freq: Four times a day (QID) | ORAL | Status: DC | PRN
  Administered 2018-10-25 – 2018-11-04 (×15): 650 mg via ORAL
  Filled 2018-10-25 (×15): qty 2

## 2018-10-25 MED ORDER — WARFARIN - PHARMACIST DOSING INPATIENT
Freq: Every day | Status: DC
  Administered 2018-10-26 – 2018-11-04 (×4)

## 2018-10-25 MED ORDER — ACETAMINOPHEN 650 MG RE SUPP: 650.0000 mg | Freq: Four times a day (QID) | RECTAL | Status: DC | PRN

## 2018-10-25 MED ORDER — LORAZEPAM 1 MG PO TABS
1.0000 mg | ORAL_TABLET | Freq: Three times a day (TID) | ORAL | Status: DC | PRN
  Administered 2018-10-25 – 2018-11-04 (×11): 1 mg via ORAL
  Filled 2018-10-25 (×11): qty 1

## 2018-10-25 MED ORDER — DILTIAZEM HCL 25 MG/5ML IV SOLN
15.0000 mg | Freq: Once | INTRAVENOUS | Status: AC
  Administered 2018-10-25: 15 mg via INTRAVENOUS
  Filled 2018-10-25: qty 5

## 2018-10-25 MED ORDER — LATANOPROST 0.005 % OP SOLN
1.0000 [drp] | Freq: Every day | OPHTHALMIC | Status: DC
  Administered 2018-10-25 – 2018-11-04 (×11): 1 [drp] via OPHTHALMIC
  Filled 2018-10-25: qty 2.5

## 2018-10-25 MED ORDER — SODIUM CHLORIDE 0.9 % IV BOLUS
1000.0000 mL | Freq: Once | INTRAVENOUS | Status: AC
  Administered 2018-10-25: 1000 mL via INTRAVENOUS

## 2018-10-25 MED ORDER — SODIUM CHLORIDE 0.9 % IV BOLUS: 1000.0000 mL | Freq: Once | INTRAVENOUS | Status: DC

## 2018-10-25 NOTE — ED Notes (Signed)
Patient transported to X-ray 

## 2018-10-25 NOTE — ED Notes (Addendum)
Pt started feeling very nauseous. Pt became very weak and pale. Pts HR increased to 180s. MD Tyrone Nine notified. MD in room.  Zoll pads placed on pt.

## 2018-10-25 NOTE — ED Notes (Signed)
Pt husband would like to be notified with updates: [name] [phone]

## 2018-10-25 NOTE — Progress Notes (Signed)
ANTICOAGULATION CONSULT NOTE - Initial Consult  Pharmacy Consult for warfarin Indication: atrial fibrillation  Patient Measurements: Height: 5' (152.4 cm) Weight: 150 lb (68 kg) IBW/kg (Calculated) : 45.5   Vital Signs: Temp: 101.9 F (38.8 C) (01/21 1613) Temp Source: Rectal (01/21 1613) BP: 137/78 (01/21 1530) Pulse Rate: 111 (01/21 1530)  Labs: Recent Labs    10/25/18 1233  HGB 14.4  HCT 44.8  PLT 245  LABPROT 21.8*  INR 1.93  CREATININE 1.14*     Medical History: Past Medical History:  Diagnosis Date  . Atrial fibrillation (HCC)    RVR hx.  Chronic Coumadin  . C. difficile colitis 09/21/2013, 11/2013  . Carotid stenosis    a. Carotid US (10/2013):  bilat 1-39%; f/u 1 year  . Chronic lower back pain   . COPD (chronic obstructive pulmonary disease) ([place])   . Depression   . Diverticulosis of colon without hemorrhage   . GERD (gastroesophageal reflux disease)    with HH  . Glaucoma   . HCAP (healthcare-associated pneumonia) 11/21/2011  . High cholesterol   . Hypertension   . Metabolic encephalopathy 3/0/8657  . Mitral regurgitation 03/11/2012  . Pancreatitis    ~2008, 11/2011 post ERCP  . Pneumonia 12/2011   "first time I know about"  . Sepsis ([place]) 11/27/2013  . SIRS (systemic inflammatory response syndrome) ([place]) 03/13/2012   a/w post ERCP  pancreatitis.   . Syncope   . Type II diabetes mellitus [facility])     Assessment:  85 yo female with recent fall + resultant ankle fracture. She has AFib and is on warfarin prior to admission. Current INR 1.9.  Goal of Therapy:  INR 2-3 Monitor platelets by anticoagulation protocol: Yes    Plan:  -warfarin 2.5 mg po x1 -daily INR   Harvel Quale 10/25/2018,4:33 PM

## 2018-10-25 NOTE — ED Notes (Signed)
Critical lab: Lactic - 2.2

## 2018-10-25 NOTE — H&P (Signed)
TRH H&P   Patient Demographics:    [name] [name], is a 85 y.o. female  MRN: [id number]   DOB - [birth year]  Admit Date - 10/25/2018  Outpatient Primary MD for the patient is [name], MD  Referring MD/NP/PA: Beaulah Corin Martinique  Outpatient Specialists: GI Dr Carlean Purl  Patient coming from: Home  No chief complaint on file.     HPI:    [name] [name]  is a 85 y.o. female, with past medical history of chronic A. fib, on warfarin, carotid stenosis, COPD, GERD, hypertension, type 2 diabetes mellitus, insulin-dependent, C. difficile in 2013 ,presents to ED with complaints of fever, chills, dysuria, polyuria, incontinence, as well she does report abdominal pain, nausea and vomiting, she has diarrhea, which is chronic, patient reports she was recently diagnosed with UTI by PCP, started on nitrofurantoin, finished that course a week ago, she felt better, but all of a sudden she presents with above complaints, ports she recently had speech evaluation, where she was told she had esophageal stenosis, and supports to follow with GI as an outpatient.  Husband reports patient sustained fall recently, with an ankle fracture, where she is wearing a protective boots. -In ED patient was febrile 101.9, leukocytosis 20.9, chest x-ray was clear, blood cultures were sent, urine analysis was positive, she was in A. fib with RVR at the time of my exam until she was as high as 180, I was called to admit     Review of systems:    At The time of my exam, patient was confused provide any reliable review of system, please review HPI for review of system provided by husband   With Past History of the following :    Past Medical History:  Diagnosis Date  . Atrial fibrillation (HCC)    RVR hx.  Chronic Coumadin  . C. difficile colitis 09/21/2013, 11/2013  . Carotid stenosis    a. Carotid US (10/2013):   bilat 1-39%; f/u 1 year  . Chronic lower back pain   . COPD (chronic obstructive pulmonary disease) ([place])   . Depression   . Diverticulosis of colon without hemorrhage   . GERD (gastroesophageal reflux disease)    with HH  . Glaucoma   . HCAP (healthcare-associated pneumonia) 11/21/2011  . High cholesterol   . Hypertension   . Metabolic encephalopathy 0/10/270  . Mitral regurgitation 03/11/2012  . Pancreatitis    ~2008, 11/2011 post ERCP  . Pneumonia 12/2011   "first time I know about"  . Sepsis ([place]) 11/27/2013  . SIRS (systemic inflammatory response syndrome) ([place]) 03/13/2012   a/w post ERCP  pancreatitis.   . Syncope   . Type II diabetes mellitus ([place])       Past Surgical History:  Procedure Laterality Date  . APPENDECTOMY  ~ 1960  . CATARACT EXTRACTION W/ INTRAOCULAR LENS  IMPLANT, BILATERAL  2000's  . CHOLECYSTECTOMY  1990's  Lap chole  . COLONOSCOPY  01/2003   diverticulosis, 51mm hyperplastic sigmoid polyp  . COLONOSCOPY WITH PROPOFOL N/A 06/02/2018   Procedure: COLONOSCOPY WITH PROPOFOL;  Surgeon: Lavena Bullion, DO;  Location: [place];  Service: Gastroenterology;  Laterality: N/A;  . DILATION AND CURETTAGE OF UTERUS    . ERCP  11/13/2011   Procedure: ENDOSCOPIC RETROGRADE CHOLANGIOPANCREATOGRAPHY (ERCP);  Surgeon: Beryle Beams, MD;  Location: [facility] ENDOSCOPY;  Service: Endoscopy;  Laterality: N/A;  . FLEXIBLE SIGMOIDOSCOPY N/A 05/02/2014   Procedure: FLEXIBLE SIGMOIDOSCOPY;  Surgeon: Gatha Mayer, MD;  Location: [facility] ENDOSCOPY;  Service: Endoscopy;  Laterality: N/A;  . HIP ARTHROPLASTY Left 01/07/2018   Procedure: ARTHROPLASTY BIPOLAR HIP (HEMIARTHROPLASTY);  Surgeon: Hiram Gash, MD;  Location: [place];  Service: Orthopedics;  Laterality: Left;  . TONSILLECTOMY AND ADENOIDECTOMY  ~ 1939  . VAGINAL HYSTERECTOMY  ~ 1960's      Social History:     Social History   Tobacco Use  . Smoking status: Never Smoker  . Smokeless tobacco: Never Used  Substance Use Topics    . Alcohol use: No     Lives -at home  Mobility -with walker    Family History :     Family History  Problem Relation Age of Onset  . Stroke Father 38  . Stroke Mother 18  . Anesthesia problems Neg Hx   . Hypotension Neg Hx   . Malignant hyperthermia Neg Hx   . Pseudochol deficiency Neg Hx   . Stomach cancer Neg Hx   . Esophageal cancer Neg Hx       Home Medications:   Prior to Admission medications   Medication Sig Start Date End Date Taking? Authorizing Provider  acetaminophen (TYLENOL) 325 MG tablet Take 650 mg by mouth every 6 (six) hours.   Yes [provider]  loperamide (LOPERAMIDE A-D) 2 MG tablet Take 1 tablet (2 mg total) by mouth as needed for diarrhea or loose stools (use at bedtime). Patient taking differently: Take 1-2 mg by mouth 4 (four) times daily as needed for diarrhea or loose stools. Take 1 mg by mouth at bedtime and 2 mg four times a day as needed for diarrhea or loose stools 11/27/14  Yes Gatha Mayer, MD  zolpidem (AMBIEN) 10 MG tablet Take 10 mg by mouth at bedtime.   Yes [provider]  Carboxymethylcellulose Sod PF (EQ RESTORE PLUS LUBRICANT EYE) 0.5 % SOLN Apply 1 drop to eye daily as needed.    [provider]  diclofenac sodium (VOLTAREN) 1 % GEL Apply 2 g topically 4 (four) times daily. Apply to right ankle. 06/07/18   Arrien, Jimmy Picket, MD  diltiazem (DILT-XR) 120 MG 24 hr capsule TAKE 1 CAPSULE(120 MG) BY MOUTH DAILY Patient taking differently: Take 120 mg by mouth daily. TAKE 1 CAPSULE(120 MG) BY MOUTH DAILY 01/04/18   Minus Breeding, MD  gabapentin (NEURONTIN) 300 MG capsule Take 300 mg by mouth daily.     [provider]  glimepiride (AMARYL) 4 MG tablet Take 4 mg by mouth daily with breakfast.     [provider]  lactose free nutrition (BOOST) LIQD Take 237 mLs by mouth as needed.    [provider]  LANTUS SOLOSTAR 100 UNIT/ML Solostar Pen Inject 22 Units into the skin daily.   09/01/13   [provider]  latanoprost (XALATAN) 0.005 % ophthalmic solution Place 1 drop into both eyes at bedtime.    [provider]  LORazepam (  ATIVAN) 1 MG tablet Take 1 tablet (1 mg total) by mouth every 8 (eight) hours as needed. 06/08/18   Reyne Dumas, MD  metoprolol tartrate (LOPRESSOR) 25 MG tablet Take 12.5 mg by mouth 2 (two) times daily.    [provider]  mirtazapine (REMERON) 7.5 MG tablet TAKE 1 TABLET(7.5 MG) BY MOUTH AT BEDTIME Patient taking differently: Take 7.5 mg by mouth at bedtime.  10/06/16   Gatha Mayer, MD  Multiple Vitamin (MULTIVITAMIN WITH MINERALS) TABS Take 1 tablet by mouth daily.    [provider]  oxybutynin (DITROPAN) 5 MG tablet Take 1 tablet by mouth at bedtime. 07/29/16   [provider]  pantoprazole (PROTONIX) 40 MG tablet Take 1 tablet (40 mg total) by mouth 2 (two) times daily. 06/07/18 10/25/18  Arrien, Jimmy Picket, MD  promethazine (PHENERGAN) 25 MG tablet Take 25 mg by mouth 3 (three) times daily as needed for nausea or vomiting.    [provider]  SYMBICORT 160-4.5 MCG/ACT inhaler Inhale 2 puffs into the lungs 2 (two) times daily.  03/08/14   [provider]  warfarin (COUMADIN) 5 MG tablet Take 0.5-1 tablets (2.5-5 mg total) by mouth daily. Take 1.25mg  every Sun/Wed then 2.5 mg the rest of the week Patient taking differently: Take 2.5-5 mg by mouth daily.  01/12/18   Cristal Ford, DO     Allergies:     Allergies  Allergen Reactions  . Lactose Intolerance (Gi) Nausea And Vomiting and Other (See Comments)    severe stomach pain  . Penicillins Hives and Itching    "haven't used any since 1970's" - have gotten cephalosporins multiple times Has patient had a PCN reaction causing immediate rash, facial/tongue/throat swelling, SOB or lightheadedness with hypotension: Yes Has patient had a PCN reaction causing severe rash involving mucus membranes or skin necrosis: Unk Has patient  had a PCN reaction that required hospitalization: Unk Has patient had a PCN reaction occurring within the last 10 years: No If all of the above answers are "NO", then may proceed with C  . Sulfa Antibiotics Hives and Itching    "haven't used any since 1970's"  . Ciprofloxacin Swelling    Site of swelling not recalled  . Sulfasalazine Hives and Itching    "haven't used any since 1970's"     Physical Exam:   Vitals  Blood pressure 130/81, pulse (!) 113, temperature 97.9 F (36.6 C), temperature source Oral, resp. rate (!) 23, height 5' (1.524 m), weight 68 kg, SpO2 96 %.   1. General ill, sick appearing female, laying in bed in mild discomfort secondary to fever and rigors   2.  Awake, confused, does not follow commands  3. No F.N deficits, ALL C.Nerves Intact, Strength 5/5 all 4 extremities, Sensation intact all 4 extremities, Plantars down going.  4. Ears and Eyes appear Normal, Conjunctivae clear, PERRLA. Moist Oral Mucosa.  5. Supple Neck, No JVD, No cervical lymphadenopathy appriciated, No Carotid Bruits.  6. Symmetrical Chest wall movement, Good air movement bilaterally, CTAB.  7.  irregular irregular, tachycardic no Gallops, Rubs or Murmurs, No Parasternal Heave.  8. Positive Bowel Sounds, Abdomen Soft, No tenderness, No organomegaly appriciated,No rebound -guarding or rigidity.  9.  No Cyanosis, Normal Skin Turgor, No Skin Rash or Bruise.  10. Good muscle tone,  joints appear normal , no effusions, Normal ROM.  11. No Palpable Lymph Nodes in Neck or Axillae    Data Review:    CBC Recent Labs  Lab  10/25/18 1233  WBC 20.6*  HGB 14.4  HCT 44.8  PLT 245  MCV 93.9  MCH 30.2  MCHC 32.1  RDW 14.2  LYMPHSABS 0.7  MONOABS 0.6  EOSABS 0.0  BASOSABS 0.1   ------------------------------------------------------------------------------------------------------------------  Chemistries  Recent Labs  Lab 10/25/18 1233  NA 137  K 3.7  CL 106  CO2 17*    GLUCOSE 207*  BUN 13  CREATININE 1.14*  CALCIUM 9.4  AST 33  ALT 18  ALKPHOS 89  BILITOT 1.2   ------------------------------------------------------------------------------------------------------------------ estimated creatinine clearance is 30.5 mL/min (A) (by C-G formula based on SCr of 1.14 mg/dL (H)). ------------------------------------------------------------------------------------------------------------------ No results for input(s): TSH, T4TOTAL, T3FREE, THYROIDAB in the last 72 hours.  Invalid input(s): FREET3  Coagulation profile Recent Labs  Lab 10/25/18 1233  INR 1.93   ------------------------------------------------------------------------------------------------------------------- No results for input(s): DDIMER in the last 72 hours. -------------------------------------------------------------------------------------------------------------------  Cardiac Enzymes No results for input(s): CKMB, TROPONINI, MYOGLOBIN in the last 168 hours.  Invalid input(s): CK ------------------------------------------------------------------------------------------------------------------ No results found for: BNP   ---------------------------------------------------------------------------------------------------------------  Urinalysis    Component Value Date/Time   COLORURINE YELLOW 06/01/2018 [place] 06/01/2018 0647   LABSPEC 1.006 06/01/2018 0647   PHURINE 6.0 06/01/2018 0647   GLUCOSEU NEGATIVE 06/01/2018 0647   HGBUR MODERATE (A) 06/01/2018 0647   BILIRUBINUR NEGATIVE 06/01/2018 0647   KETONESUR NEGATIVE 06/01/2018 0647   PROTEINUR NEGATIVE 06/01/2018 0647   UROBILINOGEN 0.2 11/23/2013 1431   NITRITE POSITIVE (A) 06/01/2018 0647   LEUKOCYTESUR TRACE (A) 06/01/2018 0647    ----------------------------------------------------------------------------------------------------------------   Imaging Results:    Dg Chest 2 View  Result  Date: 10/25/2018 CLINICAL DATA:  Atrial fibrillation with rapid ventricular response, woke up this morning with chills, shaking, body aches, history COPD, hypertension EXAM: CHEST - 2 VIEW COMPARISON:  06/01/2018 FINDINGS: Upper normal heart size with mitral annular calcification. Atherosclerotic calcification aorta. Mediastinal contours and pulmonary vascularity normal. Bronchitic changes with minimal LEFT basilar atelectasis. Lungs otherwise clear. No pulmonary infiltrate, pleural effusion or pneumothorax. Diffuse osseous demineralization. IMPRESSION: Bronchitic changes with minimal LEFT basilar atelectasis. Electronically Signed   By: Lavonia Dana M.D.   On: 10/25/2018 13:17    My personal review of EKG: Showing A. fib with RVR at rate 134, QTc 469   Assessment & Plan:    Active Problems:   UTI (urinary tract infection)   DM II (diabetes mellitus, type II), controlled (HCC)   HTN (hypertension)   Atrial fibrillation    Anxiety   COPD (chronic obstructive pulmonary disease) (HCC)   DM neuropathy, type II diabetes mellitus ([place])    Sepsis -Patient is tachycardic, fever with leukocytosis, with elevated lactic acid, with encephalopathy, source due to UTI -follow on Blood cultures, urine cultures, continue with IV fluids, I will increase her Rocephin to 2 2 g daily given her severe Reiger's, as I would think she would end having bacteremia . -Continue with IV fluids, admit to stepdown   Chronic atrial fibrillation with RVR -Heart rate significantly uncontrolled on admission, 180s at 1 point, she received 50 mg of IV Cardizem push, currently in the 150s, I will start on Cardizem drip, continue with IV fluids, continue with home dose Cardizem and metoprolol. -Pharmacy consulted to dose warfarin  History of dysphagia -With recent work-up as an outpatient, supposed to follow with Dr. Carlean Purl, now we will keep n.p.o. except meds  COPD.   -no active wheezing, continue with home meds,  Symbicort, and PRN nebs  Type 2 diabetes  mellitus. -Resume Lantus at a lower dose given she is n.p.o., continue with insulin sliding scale, will hold her glimepiride  Metabolic encephalopathy  -Secondary to sepsis, reassess when she is clinically improved  anxiety.  -Continue home medication  Hypertension -Continue with home meds  Foot fracture -Continue with her boots, husband to bring the boots, consult PT when she is more stable  DVT Prophylaxis : on warfarin  AM Labs Ordered, also please review Full Orders  Family Communication: Admission, patients condition and plan of care including tests being ordered have been discussed with the patient and husband who indicate understanding and agree with the plan and Code Status.  Code Status Full  Likely DC to Home  Condition GUARDED    Consults called: None  Admission status: Observation  Time spent in minutes : 65 minutes   Phillips Climes M.D on 10/25/2018 at 4:03 PM  Between 7am to 7pm - Pager - [phone]. After 7pm go to [email or web address] - password [email or web address]  [facility] - Office  [phone]

## 2018-10-25 NOTE — ED Notes (Signed)
Pt urinated w/ urine collected via pure wick.

## 2018-10-25 NOTE — ED Provider Notes (Signed)
[facility] EMERGENCY DEPARTMENT Provider Note   CSN: [id number] Arrival date & time: 10/25/18  1214     History   Chief Complaint No chief complaint on file.   HPI [name] [name] is a 85 y.o. female with past medical history of chronic atrial fibrillation, carotid stenosis, COPD, GERD, hypertension, type 2 diabetes, presenting to the emergency department via EMS from home with generalized body aches with chills.  Patient states yesterday she had an episode of dysuria and began having urinary incontinence which is new for her.  She states her low back hurts as well.  She denies chest pain, URI symptoms, abdominal pain, nausea, vomiting, diarrhea.  Patient reports compliance with her daily medications, including Coumadin. Per pt's husband, pt recently treated by PCP for UTI with Macrobid, finished about 1 week ago, however urinary sx recurred yesterday.   The history is provided by the patient.    Past Medical History:  Diagnosis Date  . Atrial fibrillation (HCC)    RVR hx.  Chronic Coumadin  . C. difficile colitis 09/21/2013, 11/2013  . Carotid stenosis    a. Carotid US (10/2013):  bilat 1-39%; f/u 1 year  . Chronic lower back pain   . COPD (chronic obstructive pulmonary disease) ([place])   . Depression   . Diverticulosis of colon without hemorrhage   . GERD (gastroesophageal reflux disease)    with HH  . Glaucoma   . HCAP (healthcare-associated pneumonia) 11/21/2011  . High cholesterol   . Hypertension   . Metabolic encephalopathy 12/09/1060  . Mitral regurgitation 03/11/2012  . Pancreatitis    ~2008, 11/2011 post ERCP  . Pneumonia 12/2011   "first time I know about"  . Sepsis ([place]) 11/27/2013  . SIRS (systemic inflammatory response syndrome) ([place]) 03/13/2012   a/w post ERCP  pancreatitis.   . Syncope   . Type II diabetes mellitus [facility])     Patient Active Problem List   Diagnosis Date Noted  . SIRS (systemic inflammatory response syndrome) ([place]) 10/25/2018   . Hemorrhoids   . Shock ([place])   . DM neuropathy, type II diabetes mellitus ([place]) 01/07/2018  . Hip fracture ([place]) 01/06/2018  . Advance care planning   . Goals of care, counseling/discussion   . COPD (chronic obstructive pulmonary disease) ([place]) 10/28/2017  . RLQ abdominal pain 04/24/2014  . Nausea alone 04/11/2014  . Abdominal pain 08/05/2012    Class: Acute  . Irritable bowel syndrome 04/12/2012  . Chronic diarrhea 04/12/2012  . Encounter for long-term (current) use of anticoagulants 03/17/2012  . Depression 03/13/2012  . Anxiety 03/13/2012  . Mitral regurgitation 03/11/2012  . Chronic epigastric pain 01/25/2012  . Coagulopathy ([place]) 12/12/2011  . Symptomatic anemia 12/12/2011  . Atrial fibrillation    . UTI (urinary tract infection) 11/13/2011  . DM II (diabetes mellitus, type II), controlled ([place]) 11/13/2011  . HTN (hypertension) 11/13/2011  . Hypercholesteremia 11/13/2011  . Glaucoma (increased eye pressure) 11/13/2011    Past Surgical History:  Procedure Laterality Date  . APPENDECTOMY  ~ 1960  . CATARACT EXTRACTION W/ INTRAOCULAR LENS  IMPLANT, BILATERAL  2000's  . CHOLECYSTECTOMY  1990's   Lap chole  . COLONOSCOPY  01/2003   diverticulosis, 84mm hyperplastic sigmoid polyp  . COLONOSCOPY WITH PROPOFOL N/A 06/02/2018   Procedure: COLONOSCOPY WITH PROPOFOL;  Surgeon: Lavena Bullion, DO;  Location: [facility];  Service: Gastroenterology;  Laterality: N/A;  . DILATION AND CURETTAGE OF UTERUS    . ERCP  11/13/2011   Procedure:  ENDOSCOPIC RETROGRADE CHOLANGIOPANCREATOGRAPHY (ERCP);  Surgeon: Beryle Beams, MD;  Location: [facility] ENDOSCOPY;  Service: Endoscopy;  Laterality: N/A;  . FLEXIBLE SIGMOIDOSCOPY N/A 05/02/2014   Procedure: FLEXIBLE SIGMOIDOSCOPY;  Surgeon: Gatha Mayer, MD;  Location: [facility] ENDOSCOPY;  Service: Endoscopy;  Laterality: N/A;  . HIP ARTHROPLASTY Left 01/07/2018   Procedure: ARTHROPLASTY BIPOLAR HIP (HEMIARTHROPLASTY);  Surgeon: Hiram Gash, MD;   Location: [facility];  Service: Orthopedics;  Laterality: Left;  . TONSILLECTOMY AND ADENOIDECTOMY  ~ 1939  . VAGINAL HYSTERECTOMY  ~ 1960's     OB History   No obstetric history on file.      Home Medications    Prior to Admission medications   Medication Sig Start Date End Date Taking? Authorizing Provider  acetaminophen (TYLENOL) 325 MG tablet Take 650 mg by mouth every 6 (six) hours.   Yes [provider]  loperamide (LOPERAMIDE A-D) 2 MG tablet Take 1 tablet (2 mg total) by mouth as needed for diarrhea or loose stools (use at bedtime). Patient taking differently: Take 1-2 mg by mouth 4 (four) times daily as needed for diarrhea or loose stools. Take 1 mg by mouth at bedtime and 2 mg four times a day as needed for diarrhea or loose stools 11/27/14  Yes Gatha Mayer, MD  zolpidem (AMBIEN) 10 MG tablet Take 10 mg by mouth at bedtime.   Yes [provider]  Carboxymethylcellulose Sod PF (EQ RESTORE PLUS LUBRICANT EYE) 0.5 % SOLN Apply 1 drop to eye daily as needed.    [provider]  diclofenac sodium (VOLTAREN) 1 % GEL Apply 2 g topically 4 (four) times daily. Apply to right ankle. 06/07/18   Arrien, Jimmy Picket, MD  diltiazem (DILT-XR) 120 MG 24 hr capsule TAKE 1 CAPSULE(120 MG) BY MOUTH DAILY Patient taking differently: Take 120 mg by mouth daily. TAKE 1 CAPSULE(120 MG) BY MOUTH DAILY 01/04/18   Minus Breeding, MD  gabapentin (NEURONTIN) 300 MG capsule Take 300 mg by mouth daily.     [provider]  glimepiride (AMARYL) 4 MG tablet Take 4 mg by mouth daily with breakfast.     [provider]  lactose free nutrition (BOOST) LIQD Take 237 mLs by mouth as needed.    [provider]  LANTUS SOLOSTAR 100 UNIT/ML Solostar Pen Inject 22 Units into the skin daily.  09/01/13   [provider]  latanoprost (XALATAN) 0.005 % ophthalmic solution Place 1 drop into both eyes at bedtime.    [provider]  LORazepam (ATIVAN) 1  MG tablet Take 1 tablet (1 mg total) by mouth every 8 (eight) hours as needed. 06/08/18   Reyne Dumas, MD  metoprolol tartrate (LOPRESSOR) 25 MG tablet Take 12.5 mg by mouth 2 (two) times daily.    [provider]  mirtazapine (REMERON) 7.5 MG tablet TAKE 1 TABLET(7.5 MG) BY MOUTH AT BEDTIME Patient taking differently: Take 7.5 mg by mouth at bedtime.  10/06/16   Gatha Mayer, MD  Multiple Vitamin (MULTIVITAMIN WITH MINERALS) TABS Take 1 tablet by mouth daily.    [provider]  oxybutynin (DITROPAN) 5 MG tablet Take 1 tablet by mouth at bedtime. 07/29/16   [provider]  pantoprazole (PROTONIX) 40 MG tablet Take 1 tablet (40 mg total) by mouth 2 (two) times daily. 06/07/18 10/25/18  Arrien, Jimmy Picket, MD  promethazine (PHENERGAN) 25 MG tablet Take 25 mg by mouth 3 (three) times daily as needed for nausea or vomiting.  [provider]  SYMBICORT 160-4.5 MCG/ACT inhaler Inhale 2 puffs into the lungs 2 (two) times daily.  03/08/14   [provider]  warfarin (COUMADIN) 5 MG tablet Take 0.5-1 tablets (2.5-5 mg total) by mouth daily. Take 1.25mg  every Sun/Wed then 2.5 mg the rest of the week Patient taking differently: Take 2.5-5 mg by mouth daily.  01/12/18   Cristal Ford, DO    Family History Family History  Problem Relation Age of Onset  . Stroke Father 66  . Stroke Mother 39  . Anesthesia problems Neg Hx   . Hypotension Neg Hx   . Malignant hyperthermia Neg Hx   . Pseudochol deficiency Neg Hx   . Stomach cancer Neg Hx   . Esophageal cancer Neg Hx     Social History Social History   Tobacco Use  . Smoking status: Never Smoker  . Smokeless tobacco: Never Used  Substance Use Topics  . Alcohol use: No  . Drug use: No     Allergies   Lactose intolerance (gi); Penicillins; Sulfa antibiotics; Ciprofloxacin; and Sulfasalazine   Review of Systems Review of Systems  Constitutional: Positive for chills. Negative for fever.    Respiratory: Negative for cough and shortness of breath.   Cardiovascular: Negative for chest pain, palpitations and leg swelling.  Gastrointestinal: Negative for abdominal pain, diarrhea, nausea and vomiting.  Genitourinary: Positive for dysuria.       Incontinence  Musculoskeletal: Positive for back pain.  Psychiatric/Behavioral: Negative for confusion.  All other systems reviewed and are negative.    Physical Exam Updated Vital Signs BP (!) 110/57 (BP Location: Right Arm)   Pulse 96   Temp 98.1 F (36.7 C) (Oral)   Resp 17   Ht 5' (1.524 m)   Wt 63.3 kg   SpO2 98%   BMI 27.25 kg/m   Physical Exam Vitals signs and nursing note reviewed.  Constitutional:      General: She is not in acute distress.    Appearance: She is well-developed. She is not toxic-appearing.  HENT:     Head: Normocephalic and atraumatic.     Mouth/Throat:     Mouth: Mucous membranes are moist.  Eyes:     Conjunctiva/sclera: Conjunctivae normal.  Neck:     Musculoskeletal: Normal range of motion and neck supple.  Cardiovascular:     Rate and Rhythm: Tachycardia present. Rhythm regularly irregular.  Pulmonary:     Effort: Pulmonary effort is normal. Tachypnea present.     Breath sounds: Normal breath sounds.  Abdominal:     General: Bowel sounds are normal.     Palpations: Abdomen is soft.     Tenderness: There is no abdominal tenderness. There is no guarding or rebound.  Skin:    General: Skin is warm.     Coloration: Skin is pale.  Neurological:     Mental Status: She is alert and oriented to person, place, and time.     Comments: Alert and oriented to person place and time.  Psychiatric:        Behavior: Behavior normal.      ED Treatments / Results  Labs (all labs ordered are listed, but only abnormal results are displayed) Labs Reviewed  URINALYSIS, ROUTINE W REFLEX MICROSCOPIC - Abnormal; Notable for the following components:      Result Value   APPearance CLOUDY (*)    Hgb  urine dipstick MODERATE (*)    Protein, ur 100 (*)    Nitrite POSITIVE (*)  Leukocytes, UA LARGE (*)    WBC, UA >50 (*)    Bacteria, UA MANY (*)    All other components within normal limits  CBC WITH DIFFERENTIAL/PLATELET - Abnormal; Notable for the following components:   WBC 20.6 (*)    Neutro Abs 19.1 (*)    Abs Immature Granulocytes 0.14 (*)    All other components within normal limits  PROTIME-INR - Abnormal; Notable for the following components:   Prothrombin Time 21.8 (*)    All other components within normal limits  COMPREHENSIVE METABOLIC PANEL - Abnormal; Notable for the following components:   CO2 17 (*)    Glucose, Bld 207 (*)    Creatinine, Ser 1.14 (*)    GFR calc non Af Amer 44 (*)    GFR calc Af Amer 50 (*)    All other components within normal limits  LACTIC ACID, PLASMA - Abnormal; Notable for the following components:   Lactic Acid, Venous 2.0 (*)    All other components within normal limits  LACTIC ACID, PLASMA - Abnormal; Notable for the following components:   Lactic Acid, Venous 2.2 (*)    All other components within normal limits  GLUCOSE, CAPILLARY - Abnormal; Notable for the following components:   Glucose-Capillary 185 (*)    All other components within normal limits  CULTURE, BLOOD (ROUTINE X 2)  CULTURE, BLOOD (ROUTINE X 2)  URINE CULTURE  PROTIME-INR  BASIC METABOLIC PANEL  CBC    EKG EKG Interpretation  Date/Time:  Tuesday October 25 2018 12:22:41 EST Ventricular Rate:  134 PR Interval:    QRS Duration: 81 QT Interval:  321 QTC Calculation: 469 R Axis:   48 Text Interpretation:  Atrial fibrillation Ventricular premature complex Low voltage, precordial leads ST depression, probably rate related Baseline wander in lead(s) V6 Otherwise no significant change Confirmed by Deno Etienne [phone]) on 10/25/2018 12:48:49 PM   Radiology Dg Chest 2 View  Result Date: 10/25/2018 CLINICAL DATA:  Atrial fibrillation with rapid ventricular response,  woke up this morning with chills, shaking, body aches, history COPD, hypertension EXAM: CHEST - 2 VIEW COMPARISON:  06/01/2018 FINDINGS: Upper normal heart size with mitral annular calcification. Atherosclerotic calcification aorta. Mediastinal contours and pulmonary vascularity normal. Bronchitic changes with minimal LEFT basilar atelectasis. Lungs otherwise clear. No pulmonary infiltrate, pleural effusion or pneumothorax. Diffuse osseous demineralization. IMPRESSION: Bronchitic changes with minimal LEFT basilar atelectasis. Electronically Signed   By: Lavonia Dana M.D.   On: 10/25/2018 13:17    Procedures Procedures (including critical care time) CRITICAL CARE Performed by: [place] N Consuela Widener   Total critical care time: 40 minutes  Critical care time was exclusive of separately billable procedures and treating other patients.  Critical care was necessary to treat or prevent imminent or life-threatening deterioration.  Critical care was time spent personally by me on the following activities: development of treatment plan with patient and/or surrogate as well as nursing, discussions with consultants, evaluation of patient's response to treatment, examination of patient, obtaining history from patient or surrogate, ordering and performing treatments and interventions, ordering and review of laboratory studies, ordering and review of radiographic studies, pulse oximetry and re-evaluation of patient's condition.  Medications Ordered in ED Medications  diltiazem (DILACOR XR) 24 hr capsule 120 mg (has no administration in time range)  gabapentin (NEURONTIN) capsule 300 mg (has no administration in time range)  latanoprost (XALATAN) 0.005 % ophthalmic solution 1 drop (has no administration in time range)  LORazepam (ATIVAN) tablet 1 mg (has no  administration in time range)  multivitamin with minerals tablet 1 tablet (has no administration in time range)  oxybutynin (DITROPAN) tablet 5 mg (5 mg Oral  Given 10/25/18 2106)  pantoprazole (PROTONIX) EC tablet 40 mg (40 mg Oral Given 10/25/18 2106)  mometasone-formoterol (DULERA) 200-5 MCG/ACT inhaler 2 puff (2 puffs Inhalation Not Given 10/25/18 1915)  0.9 %  sodium chloride infusion ( Intravenous New Bag/Given 10/25/18 2047)  acetaminophen (TYLENOL) tablet 650 mg (650 mg Oral Given 10/25/18 2106)    Or  acetaminophen (TYLENOL) suppository 650 mg ( Rectal See Alternative 10/25/18 2106)  ondansetron (ZOFRAN) tablet 4 mg (has no administration in time range)    Or  ondansetron (ZOFRAN) injection 4 mg (has no administration in time range)  albuterol (PROVENTIL) (2.5 MG/3ML) 0.083% nebulizer solution 2.5 mg (has no administration in time range)  diltiazem (CARDIZEM) 100 mg in dextrose 5% 182mL (1 mg/mL) infusion (10 mg/hr Intravenous Rate/Dose Change 10/25/18 1748)  cefTRIAXone (ROCEPHIN) 1 g in sodium chloride 0.9 % 100 mL IVPB (has no administration in time range)  0.9 %  sodium chloride infusion ( Intravenous Stopped 10/25/18 2047)  insulin glargine (LANTUS) injection 10 Units (has no administration in time range)  Warfarin - Pharmacist Dosing Inpatient (has no administration in time range)  sodium chloride 0.9 % bolus 500 mL (0 mLs Intravenous Stopped 10/25/18 1519)  sodium chloride 0.9 % bolus 1,000 mL (0 mLs Intravenous Stopped 10/25/18 1529)  cefTRIAXone (ROCEPHIN) 1 g in sodium chloride 0.9 % 100 mL IVPB (0 g Intravenous Stopped 10/25/18 1604)  ondansetron (ZOFRAN) injection 4 mg (4 mg Intravenous Given 10/25/18 1553)  diltiazem (CARDIZEM) injection 15 mg (15 mg Intravenous Given 10/25/18 1619)  warfarin (COUMADIN) tablet 2.5 mg (2.5 mg Oral Given 10/25/18 2105)     Initial Impression / Assessment and Plan / ED Course  I have reviewed the triage vital signs and the nursing notes.  Pertinent labs & imaging results that were available during my care of the patient were reviewed by me and considered in my medical decision making (see chart for  details).     Patient presenting via EMS from home with recurrent urinary symptoms as well as fever and chills after recently being treated by PCP with Macrobid for UTI.  On arrival, patient is in A. fib (chronic) however with rapid rate ranging from 130s to 150s.  Oral temp is normal.  Lungs are clear.  Abdomen is benign.  Patient is alert and oriented, following simple commands.  Labs and urine ordered.  IV fluids.  Patient discussed with Dr. Tyrone Nine.  Plan to hold on rate control at this time, as suspect tachycardia secondary to dehydration and likely UTI.  Will reevaluate after fluids and labs result.  Patient reassessed, heart rate slightly improving to the 110s. Multiple liters of IVF administered. Labs revealing significant leukocytosis of 20.6.  Creatinine elevated from baseline to 1.14, bicarb 17 with normal gap.  Lactic acidosis of 2.  Rectal temp 101.9 F.  Nursing still attempting to obtain urine.  Given patient's presentation, likelihood of recurrent UTI.  IV antibiotics ordered to treat patient's likely sepsis.  Will consult triad for admission.  Dr. Waldron Labs with triad accepting admission.  3:45pm per nursing, patient's heart rate elevated into the 180s and patient with episodes of emesis.   Dr. Tyrone Nine at bedside during this time, who recommends acute elevation in heart rate likely secondary to vomiting.  Treated with IV Zofran and repeat EKG. Upon my evaluation, pt appears anxious and  is unable to tell me her symptoms, stating she doesn't fell well. Heart rate improved slightly to 150s.   Hospitalist at bedside.   Final Clinical Impressions(s) / ED Diagnoses   Final diagnoses:  Urinary tract infection with hematuria, site unspecified  Atrial fibrillation with RVR ([place])  Sepsis due to Streptococcus species without acute organ dysfunction [facility])    ED Discharge Orders    None       [place] Zarazua, [place] N, PA-C 10/25/18 2145    Deno Etienne, DO 10/26/18 [phone]

## 2018-10-25 NOTE — ED Triage Notes (Signed)
Pt presents to ED from home. Pt woke up this morning with chills, shaking and body aches. Pt has been incontinent of urine

## 2018-10-26 DIAGNOSIS — R6510 Systemic inflammatory response syndrome (SIRS) of non-infectious origin without acute organ dysfunction: Secondary | ICD-10-CM | POA: Diagnosis present

## 2018-10-26 DIAGNOSIS — Z9841 Cataract extraction status, right eye: Secondary | ICD-10-CM | POA: Diagnosis not present

## 2018-10-26 DIAGNOSIS — Z961 Presence of intraocular lens: Secondary | ICD-10-CM | POA: Diagnosis present

## 2018-10-26 DIAGNOSIS — F419 Anxiety disorder, unspecified: Secondary | ICD-10-CM | POA: Diagnosis present

## 2018-10-26 DIAGNOSIS — R110 Nausea: Secondary | ICD-10-CM | POA: Diagnosis not present

## 2018-10-26 DIAGNOSIS — Z9842 Cataract extraction status, left eye: Secondary | ICD-10-CM | POA: Diagnosis not present

## 2018-10-26 DIAGNOSIS — R4182 Altered mental status, unspecified: Secondary | ICD-10-CM | POA: Diagnosis not present

## 2018-10-26 DIAGNOSIS — M545 Low back pain: Secondary | ICD-10-CM | POA: Diagnosis present

## 2018-10-26 DIAGNOSIS — K219 Gastro-esophageal reflux disease without esophagitis: Secondary | ICD-10-CM | POA: Diagnosis present

## 2018-10-26 DIAGNOSIS — R32 Unspecified urinary incontinence: Secondary | ICD-10-CM | POA: Diagnosis present

## 2018-10-26 DIAGNOSIS — Z9181 History of falling: Secondary | ICD-10-CM | POA: Diagnosis not present

## 2018-10-26 DIAGNOSIS — I10 Essential (primary) hypertension: Secondary | ICD-10-CM

## 2018-10-26 DIAGNOSIS — N390 Urinary tract infection, site not specified: Secondary | ICD-10-CM | POA: Diagnosis present

## 2018-10-26 DIAGNOSIS — R7881 Bacteremia: Secondary | ICD-10-CM

## 2018-10-26 DIAGNOSIS — B952 Enterococcus as the cause of diseases classified elsewhere: Secondary | ICD-10-CM | POA: Diagnosis present

## 2018-10-26 DIAGNOSIS — E1140 Type 2 diabetes mellitus with diabetic neuropathy, unspecified: Secondary | ICD-10-CM | POA: Diagnosis present

## 2018-10-26 DIAGNOSIS — J449 Chronic obstructive pulmonary disease, unspecified: Secondary | ICD-10-CM | POA: Diagnosis present

## 2018-10-26 DIAGNOSIS — S92909D Unspecified fracture of unspecified foot, subsequent encounter for fracture with routine healing: Secondary | ICD-10-CM | POA: Diagnosis not present

## 2018-10-26 DIAGNOSIS — I482 Chronic atrial fibrillation, unspecified: Secondary | ICD-10-CM | POA: Diagnosis present

## 2018-10-26 DIAGNOSIS — A4151 Sepsis due to Escherichia coli [E. coli]: Secondary | ICD-10-CM | POA: Diagnosis present

## 2018-10-26 DIAGNOSIS — Z7901 Long term (current) use of anticoagulants: Secondary | ICD-10-CM | POA: Diagnosis not present

## 2018-10-26 DIAGNOSIS — A419 Sepsis, unspecified organism: Secondary | ICD-10-CM | POA: Diagnosis not present

## 2018-10-26 DIAGNOSIS — G92 Toxic encephalopathy: Secondary | ICD-10-CM | POA: Diagnosis present

## 2018-10-26 DIAGNOSIS — E8770 Fluid overload, unspecified: Secondary | ICD-10-CM | POA: Diagnosis not present

## 2018-10-26 DIAGNOSIS — E739 Lactose intolerance, unspecified: Secondary | ICD-10-CM | POA: Diagnosis present

## 2018-10-26 DIAGNOSIS — G8929 Other chronic pain: Secondary | ICD-10-CM | POA: Diagnosis present

## 2018-10-26 DIAGNOSIS — E860 Dehydration: Secondary | ICD-10-CM | POA: Diagnosis present

## 2018-10-26 DIAGNOSIS — R1319 Other dysphagia: Secondary | ICD-10-CM

## 2018-10-26 DIAGNOSIS — H409 Unspecified glaucoma: Secondary | ICD-10-CM | POA: Diagnosis present

## 2018-10-26 DIAGNOSIS — E872 Acidosis: Secondary | ICD-10-CM | POA: Diagnosis present

## 2018-10-26 DIAGNOSIS — W19XXXD Unspecified fall, subsequent encounter: Secondary | ICD-10-CM | POA: Diagnosis present

## 2018-10-26 DIAGNOSIS — E119 Type 2 diabetes mellitus without complications: Secondary | ICD-10-CM

## 2018-10-26 LAB — BLOOD CULTURE ID PANEL (REFLEXED)
Acinetobacter baumannii: NOT DETECTED
CANDIDA GLABRATA: NOT DETECTED
Candida albicans: NOT DETECTED
Candida krusei: NOT DETECTED
Candida parapsilosis: NOT DETECTED
Candida tropicalis: NOT DETECTED
Carbapenem resistance: NOT DETECTED
Enterobacter cloacae complex: NOT DETECTED
Enterobacteriaceae species: DETECTED — AB
Enterococcus species: NOT DETECTED
Escherichia coli: DETECTED — AB
Haemophilus influenzae: NOT DETECTED
Klebsiella oxytoca: NOT DETECTED
Klebsiella pneumoniae: NOT DETECTED
Listeria monocytogenes: NOT DETECTED
NEISSERIA MENINGITIDIS: NOT DETECTED
Proteus species: NOT DETECTED
Pseudomonas aeruginosa: NOT DETECTED
Serratia marcescens: NOT DETECTED
Staphylococcus aureus (BCID): NOT DETECTED
Staphylococcus species: NOT DETECTED
Streptococcus agalactiae: NOT DETECTED
Streptococcus pneumoniae: NOT DETECTED
Streptococcus pyogenes: NOT DETECTED
Streptococcus species: NOT DETECTED

## 2018-10-26 LAB — GLUCOSE, CAPILLARY
GLUCOSE-CAPILLARY: 172 mg/dL — AB (ref 70–99)
Glucose-Capillary: 79 mg/dL (ref 70–99)
Glucose-Capillary: 114 mg/dL — ABNORMAL HIGH (ref 70–99)
Glucose-Capillary: 117 mg/dL — ABNORMAL HIGH (ref 70–99)

## 2018-10-26 LAB — BASIC METABOLIC PANEL
Anion gap: 9 (ref 5–15)
BUN: 14 mg/dL (ref 8–23)
CALCIUM: 8 mg/dL — AB (ref 8.9–10.3)
CO2: 17 mmol/L — ABNORMAL LOW (ref 22–32)
Chloride: 113 mmol/L — ABNORMAL HIGH (ref 98–111)
Creatinine, Ser: 0.99 mg/dL (ref 0.44–1.00)
GFR calc Af Amer: 60 mL/min — ABNORMAL LOW (ref >60)
GFR calc non Af Amer: 52 mL/min — ABNORMAL LOW (ref >60)
Glucose, Bld: 109 mg/dL — ABNORMAL HIGH (ref 70–99)
POTASSIUM: 3.4 mmol/L — AB (ref 3.5–5.1)
Sodium: 139 mmol/L (ref 135–145)

## 2018-10-26 LAB — CBC
HCT: 34.8 % — ABNORMAL LOW (ref 36.0–46.0)
Hemoglobin: 11.2 g/dL — ABNORMAL LOW (ref 12.0–15.0)
MCH: 29.7 pg (ref 26.0–34.0)
MCHC: 32.2 g/dL (ref 30.0–36.0)
MCV: 92.3 fL (ref 80.0–100.0)
Platelets: 170 10*3/uL (ref 150–400)
RBC: 3.77 MIL/uL — ABNORMAL LOW (ref 3.87–5.11)
RDW: 14.6 % (ref 11.5–15.5)
WBC: 17.3 10*3/uL — ABNORMAL HIGH (ref 4.0–10.5)
nRBC: 0 % (ref 0.0–0.2)

## 2018-10-26 LAB — PROTIME-INR
INR: 1.99
Prothrombin Time: 22.4 seconds — ABNORMAL HIGH (ref 11.4–15.2)

## 2018-10-26 MED ORDER — WARFARIN SODIUM 5 MG PO TABS
5.0000 mg | ORAL_TABLET | Freq: Once | ORAL | Status: AC
  Administered 2018-10-26: 5 mg via ORAL
  Filled 2018-10-26: qty 1

## 2018-10-26 MED ORDER — INSULIN ASPART 100 UNIT/ML ~~LOC~~ SOLN
0.0000 [IU] | Freq: Three times a day (TID) | SUBCUTANEOUS | Status: DC
  Administered 2018-10-27 – 2018-10-29 (×6): 2 [IU] via SUBCUTANEOUS
  Administered 2018-10-29: 1 [IU] via SUBCUTANEOUS
  Administered 2018-10-30 (×2): 2 [IU] via SUBCUTANEOUS
  Administered 2018-10-30: 3 [IU] via SUBCUTANEOUS
  Administered 2018-10-31 – 2018-11-02 (×8): 2 [IU] via SUBCUTANEOUS
  Administered 2018-11-03: 3 [IU] via SUBCUTANEOUS
  Administered 2018-11-03 – 2018-11-04 (×3): 2 [IU] via SUBCUTANEOUS
  Administered 2018-11-04: 1 [IU] via SUBCUTANEOUS
  Administered 2018-11-04: 3 [IU] via SUBCUTANEOUS
  Administered 2018-11-05: 2 [IU] via SUBCUTANEOUS
  Administered 2018-11-05: 5 [IU] via SUBCUTANEOUS

## 2018-10-26 MED ORDER — SODIUM CHLORIDE 0.9 % IV SOLN
2.0000 g | Freq: Once | INTRAVENOUS | Status: AC
  Administered 2018-10-26: 2 g via INTRAVENOUS
  Filled 2018-10-26 (×2): qty 20

## 2018-10-26 MED ORDER — SODIUM CHLORIDE 0.9 % IV SOLN: 1.0000 g | INTRAVENOUS | Status: DC

## 2018-10-26 MED ORDER — WARFARIN SODIUM 2.5 MG PO TABS: 2.5000 mg | ORAL_TABLET | Freq: Once | ORAL | Status: DC

## 2018-10-26 MED ORDER — POTASSIUM CHLORIDE CRYS ER 20 MEQ PO TBCR
40.0000 meq | EXTENDED_RELEASE_TABLET | Freq: Once | ORAL | Status: AC
  Administered 2018-10-26: 40 meq via ORAL
  Filled 2018-10-26: qty 2

## 2018-10-26 MED ORDER — LOPERAMIDE HCL 2 MG PO CAPS
2.0000 mg | ORAL_CAPSULE | ORAL | Status: DC | PRN
  Administered 2018-10-26 – 2018-11-04 (×6): 2 mg via ORAL
  Filled 2018-10-26 (×7): qty 1

## 2018-10-26 MED ORDER — INSULIN ASPART 100 UNIT/ML ~~LOC~~ SOLN
0.0000 [IU] | Freq: Every day | SUBCUTANEOUS | Status: DC
  Administered 2018-10-30 – 2018-11-04 (×3): 2 [IU] via SUBCUTANEOUS

## 2018-10-26 MED ORDER — MIRTAZAPINE 15 MG PO TABS
7.5000 mg | ORAL_TABLET | Freq: Every day | ORAL | Status: DC
  Administered 2018-10-26 – 2018-11-04 (×10): 7.5 mg via ORAL
  Filled 2018-10-26 (×10): qty 1

## 2018-10-26 MED ORDER — METOPROLOL TARTRATE 12.5 MG HALF TABLET
12.5000 mg | ORAL_TABLET | Freq: Two times a day (BID) | ORAL | Status: DC
  Administered 2018-10-26 – 2018-11-05 (×21): 12.5 mg via ORAL
  Filled 2018-10-26 (×21): qty 1

## 2018-10-26 MED ORDER — SODIUM CHLORIDE 0.9 % IV SOLN
2.0000 g | INTRAVENOUS | Status: DC
  Administered 2018-10-27 – 2018-10-31 (×5): 2 g via INTRAVENOUS
  Filled 2018-10-26 (×5): qty 20

## 2018-10-26 NOTE — Progress Notes (Addendum)
[place] for warfarin Indication: atrial fibrillation  Patient Measurements: Height: 5' (152.4 cm) Weight: 139 lb 8.8 oz (63.3 kg) IBW/kg (Calculated) : 45.5   Vital Signs: Temp: 97.6 F (36.4 C) (01/22 1117) Temp Source: Oral (01/22 1117) BP: 121/64 (01/22 1117) Pulse Rate: 92 (01/22 1117)  Labs: Recent Labs    10/25/18 1233 10/26/18 0350  HGB 14.4 11.2*  HCT 44.8 34.8*  PLT 245 170  LABPROT 21.8* 22.4*  INR 1.93 1.99  CREATININE 1.14* 0.99     Medical History: Past Medical History:  Diagnosis Date  . Atrial fibrillation (HCC)    RVR hx.  Chronic Coumadin  . C. difficile colitis 09/21/2013, 11/2013  . Carotid stenosis    a. Carotid US (10/2013):  bilat 1-39%; f/u 1 year  . Chronic lower back pain   . COPD (chronic obstructive pulmonary disease) ([place])   . Depression   . Diverticulosis of colon without hemorrhage   . GERD (gastroesophageal reflux disease)    with HH  . Glaucoma   . HCAP (healthcare-associated pneumonia) 11/21/2011  . High cholesterol   . Hypertension   . Metabolic encephalopathy 01/03/9378  . Mitral regurgitation 03/11/2012  . Pancreatitis    ~2008, 11/2011 post ERCP  . Pneumonia 12/2011   "first time I know about"  . Sepsis ([place]) 11/27/2013  . SIRS (systemic inflammatory response syndrome) ([place]) 03/13/2012   a/w post ERCP  pancreatitis.   . Syncope   . Type II diabetes mellitus [facility])     Assessment:  85 yo female with recent fall + resultant ankle fracture noted with ecoli bacteremia. She has AFib and is on warfarin prior to admission. -INR 1.99  Need to clarify home warfarin regimen but family not available at this time. Last know regimen was:  2.5mg /d except take 5mg  MWF  Goal of Therapy:  INR 2-3 Monitor platelets by anticoagulation protocol: Yes    Plan:  -warfarin 5 mg po x1 -daily INR  Hildred Laser, PharmD Clinical Pharmacist **Pharmacist phone directory can now be found on [email or web address] ([email or web address]  [email or web address]).  Listed under [place].

## 2018-10-26 NOTE — Progress Notes (Addendum)
PROGRESS NOTE    [name] [name]  KZS:[id number] DOB: October 01, 1930 DOA: 10/25/2018 PCP: [name], MD   Brief Narrative: Patient is 85 year old female with past medical history of chronic A. fib on warfarin, carotid stenosis, COPD, GERD, hypertension, type 2 diabetes, C. difficile who presents to the emergency  department with complaints of fever, chills, dysuria and incontinence.  She is from home and lives with her husband.  She ambulates with the help of rolling walker.  When she presented she was febrile with temperature of 101.9. ,  Had leukocytosis.  UA was abnormal.  Urinary tract infection was suspected on presentation.  Currently blood cultures showing E. coli.  Started on ceftriaxone.  Assessment & Plan:   Principal Problem:   Bacteremia Active Problems:   UTI (urinary tract infection)   DM II (diabetes mellitus, type II), controlled (HCC)   HTN (hypertension)   Atrial fibrillation    Anxiety   COPD (chronic obstructive pulmonary disease) (HCC)   DM neuropathy, type II diabetes mellitus (HCC)   SIRS (systemic inflammatory response syndrome) (HCC)  Gram-negative bacteremia/sepsis: Presented with fever, tachycardia, lactic acidosis.  She was confused on presentation. UA was abnormal.Likely source is urinary.  Blood cultures showing E. coli.  Currently on ceftriaxone 2 g daily.  Currently she is afebrile.  Hemodynamically stable.  We will continue ceftriaxone until we get the final sensitivity.  Altered mental status: Metabolic encephalopathy due to sepsis.  Mental status has improved this morning.  Currently she is alert and oriented.  Chronic A. fib with RVR: Heart rate better this morning.  She was given Cardizem IV push and was started on Cardizem drip on presentation.  She is on oral Cardizem and metoprolol at home which we will continue..  On warfarin for anticoagulation.  Continue monitoring INR daily.  Dysphagia: She has history of dysphagia.  She follows with GI  as an outpatient.  Speech therapy already evaluated her and recommended dysphagia 3 diet.  COPD: Currently stable.  Continue inhalers and bronchodilators.  Type 2 diabetes mellitus: On insulin at home.  Lantus and sliding scale insulin will be continued.  Hypertension: Currently blood pressure stable.  We will continue to monitor.  Deconditioning/debility/recent foot fracture: Physical therapy will evaluate her.          DVT prophylaxis:Warfarin Code Status: Full code Family Communication: None present at the bedside Disposition Plan: Pending PT evaluation.  Medically not ready for discharge   Consultants: None  Procedures: None  Antimicrobials:  Anti-infectives (From admission, onward)   Start     Dose/Rate Route Frequency Ordered Stop   10/27/18 0800  cefTRIAXone (ROCEPHIN) 1 g in sodium chloride 0.9 % 100 mL IVPB  Status:  Discontinued     1 g 200 mL/hr over 30 Minutes Intravenous Every 24 hours 10/26/18 0400 10/26/18 0937   10/27/18 0800  cefTRIAXone (ROCEPHIN) 2 g in sodium chloride 0.9 % 100 mL IVPB     2 g 200 mL/hr over 30 Minutes Intravenous Every 24 hours 10/26/18 0937     10/26/18 1400  cefTRIAXone (ROCEPHIN) 1 g in sodium chloride 0.9 % 100 mL IVPB  Status:  Discontinued     1 g 200 mL/hr over 30 Minutes Intravenous Every 24 hours 10/25/18 1556 10/25/18 1625   10/26/18 1400  cefTRIAXone (ROCEPHIN) 1 g in sodium chloride 0.9 % 100 mL IVPB  Status:  Discontinued     1 g 200 mL/hr over 30 Minutes Intravenous Every 24 hours 10/25/18 1625 10/26/18 0400  10/26/18 0430  cefTRIAXone (ROCEPHIN) 2 g in sodium chloride 0.9 % 100 mL IVPB     2 g 200 mL/hr over 30 Minutes Intravenous  Once 10/26/18 0400 10/26/18 0757   10/25/18 1630  cefTRIAXone (ROCEPHIN) 1 g in sodium chloride 0.9 % 100 mL IVPB  Status:  Discontinued     1 g 200 mL/hr over 30 Minutes Intravenous  Once 10/25/18 1625 10/25/18 1643   10/25/18 1500  cefTRIAXone (ROCEPHIN) 1 g in sodium chloride 0.9 % 100  mL IVPB     1 g 200 mL/hr over 30 Minutes Intravenous  Once 10/25/18 1457 10/25/18 1604      Subjective: Patient seen and examined the bedside this morning.  Her heart rate is better controlled this morning.  Hemodynamically stable.  Afebrile.  Her mental status is improved.  Currently she is alert and oriented.  Still remains significantly weak.  Objective: Vitals:   10/26/18 0754 10/26/18 0812 10/26/18 1115 10/26/18 1117  BP: (!) 102/58  121/64 121/64  Pulse: 77   92  Resp: (!) 23   20  Temp: 98 F (36.7 C)   97.6 F (36.4 C)  TempSrc: Oral   Oral  SpO2: 96% 94%  96%  Weight:      Height:        Intake/Output Summary (Last 24 hours) at 10/26/2018 1323 Last data filed at 10/26/2018 2637 Gross per 24 hour  Intake 2336.03 ml  Output 0 ml  Net 2336.03 ml   Filed Weights   10/25/18 1219 10/25/18 2015  Weight: 68 kg 63.3 kg    Examination:  General exam: Elderly debilitated female  HEENT:PERRL,Oral mucosa moist, Ear/Nose normal on gross exam Respiratory system: Bilateral equal air entry, normal vesicular breath sounds, no wheezes or crackles  Cardiovascular system: Irregularly irregular no JVD, murmurs, rubs, gallops or clicks. No pedal edema. Gastrointestinal system: Abdomen is nondistended, soft and nontender. No organomegaly or masses felt. Normal bowel sounds heard. Central nervous system: Alert and oriented. No focal neurological deficits. Extremities: No edema, no clubbing ,no cyanosis, distal peripheral pulses palpable. Skin: No rashes, lesions or ulcers,no icterus ,no pallor MSK: Normal muscle bulk,tone ,power Psychiatry: Judgement and insight appear normal. Mood & affect appropriate.     Data Reviewed: I have personally reviewed following labs and imaging studies  CBC: Recent Labs  Lab 10/25/18 1233 10/26/18 0350  WBC 20.6* 17.3*  NEUTROABS 19.1*  --   HGB 14.4 11.2*  HCT 44.8 34.8*  MCV 93.9 92.3  PLT 245 858   Basic Metabolic Panel: Recent Labs    Lab 10/25/18 1233 10/26/18 0350  NA 137 139  K 3.7 3.4*  CL 106 113*  CO2 17* 17*  GLUCOSE 207* 109*  BUN 13 14  CREATININE 1.14* 0.99  CALCIUM 9.4 8.0*   GFR: Estimated Creatinine Clearance: 33.9 mL/min (by C-G formula based on SCr of 0.99 mg/dL). Liver Function Tests: Recent Labs  Lab 10/25/18 1233  AST 33  ALT 18  ALKPHOS 89  BILITOT 1.2  PROT 7.2  ALBUMIN 3.5   No results for input(s): LIPASE, AMYLASE in the last 168 hours. No results for input(s): AMMONIA in the last 168 hours. Coagulation Profile: Recent Labs  Lab 10/25/18 1233 10/26/18 0350  INR 1.93 1.99   Cardiac Enzymes: No results for input(s): CKTOTAL, CKMB, CKMBINDEX, TROPONINI in the last 168 hours. BNP (last 3 results) No results for input(s): PROBNP in the last 8760 hours. HbA1C: No results for input(s): HGBA1C in the  last 72 hours. CBG: Recent Labs  Lab 10/25/18 2044 10/26/18 0756 10/26/18 1111  GLUCAP 185* 79 114*   Lipid Profile: No results for input(s): CHOL, HDL, LDLCALC, TRIG, CHOLHDL, LDLDIRECT in the last 72 hours. Thyroid Function Tests: No results for input(s): TSH, T4TOTAL, FREET4, T3FREE, THYROIDAB in the last 72 hours. Anemia Panel: No results for input(s): VITAMINB12, FOLATE, FERRITIN, TIBC, IRON, RETICCTPCT in the last 72 hours. Sepsis Labs: Recent Labs  Lab 10/25/18 1330 10/25/18 1517  LATICACIDVEN 2.0* 2.2*    Recent Results (from the past 240 hour(s))  Culture, blood (routine x 2)     Status: None (Preliminary result)   Collection Time: 10/25/18 12:50 PM  Result Value Ref Range Status   Specimen Description BLOOD BLOOD RIGHT HAND  Final   Special Requests   Final    BOTTLES DRAWN AEROBIC AND ANAEROBIC Blood Culture adequate volume   Culture  Setup Time   Final    GRAM NEGATIVE RODS AEROBIC BOTTLE ONLY CRITICAL VALUE NOTED.  VALUE IS CONSISTENT WITH PREVIOUSLY REPORTED AND CALLED VALUE.    Culture   Final    NO GROWTH < 24 HOURS Performed at [facility], [place] [address] [place], [place] [address]    Report Status PENDING  Incomplete  Culture, blood (routine x 2)     Status: None (Preliminary result)   Collection Time: 10/25/18 12:55 PM  Result Value Ref Range Status   Specimen Description BLOOD BLOOD LEFT HAND  Final   Special Requests   Final    BOTTLES DRAWN AEROBIC AND ANAEROBIC Blood Culture adequate volume   Culture  Setup Time   Final    GRAM NEGATIVE RODS ANAEROBIC BOTTLE ONLY CRITICAL RESULT CALLED TO, READ BACK BY AND VERIFIED WITHDenton Brick PHARMD 1950 10/26/2018 A BROWNING Performed at [facility], [place] [address] [place], [place] [address]    Culture GRAM NEGATIVE RODS  Final   Report Status PENDING  Incomplete  Blood Culture ID Panel (Reflexed)     Status: Abnormal   Collection Time: 10/25/18 12:55 PM  Result Value Ref Range Status   Enterococcus species NOT DETECTED NOT DETECTED Final   Listeria monocytogenes NOT DETECTED NOT DETECTED Final   Staphylococcus species NOT DETECTED NOT DETECTED Final   Staphylococcus aureus (BCID) NOT DETECTED NOT DETECTED Final   Streptococcus species NOT DETECTED NOT DETECTED Final   Streptococcus agalactiae NOT DETECTED NOT DETECTED Final   Streptococcus pneumoniae NOT DETECTED NOT DETECTED Final   Streptococcus pyogenes NOT DETECTED NOT DETECTED Final   Acinetobacter baumannii NOT DETECTED NOT DETECTED Final   Enterobacteriaceae species DETECTED (A) NOT DETECTED Final    Comment: Enterobacteriaceae represent a large family of gram-negative bacteria, not a single organism. CRITICAL RESULT CALLED TO, READ BACK BY AND VERIFIED WITH: Denton Brick PHARMD 1245 10/26/2018 A BROWNING    Enterobacter cloacae complex NOT DETECTED NOT DETECTED Final   Escherichia coli DETECTED (A) NOT DETECTED Final    Comment: CRITICAL RESULT CALLED TO, READ BACK BY AND VERIFIED WITH: Denton Brick PHARMD 8099 10/26/2018 A BROWNING    Klebsiella oxytoca NOT DETECTED NOT DETECTED Final   Klebsiella  pneumoniae NOT DETECTED NOT DETECTED Final   Proteus species NOT DETECTED NOT DETECTED Final   Serratia marcescens NOT DETECTED NOT DETECTED Final   Carbapenem resistance NOT DETECTED NOT DETECTED Final   Haemophilus influenzae NOT DETECTED NOT DETECTED Final   Neisseria meningitidis NOT DETECTED NOT DETECTED Final   Pseudomonas aeruginosa NOT  DETECTED NOT DETECTED Final   Candida albicans NOT DETECTED NOT DETECTED Final   Candida glabrata NOT DETECTED NOT DETECTED Final   Candida krusei NOT DETECTED NOT DETECTED Final   Candida parapsilosis NOT DETECTED NOT DETECTED Final   Candida tropicalis NOT DETECTED NOT DETECTED Final    Comment: Performed at [facility], [place] [address] [place], [place] [address]         Radiology Studies: Dg Chest 2 View  Result Date: 10/25/2018 CLINICAL DATA:  Atrial fibrillation with rapid ventricular response, woke up this morning with chills, shaking, body aches, history COPD, hypertension EXAM: CHEST - 2 VIEW COMPARISON:  06/01/2018 FINDINGS: Upper normal heart size with mitral annular calcification. Atherosclerotic calcification aorta. Mediastinal contours and pulmonary vascularity normal. Bronchitic changes with minimal LEFT basilar atelectasis. Lungs otherwise clear. No pulmonary infiltrate, pleural effusion or pneumothorax. Diffuse osseous demineralization. IMPRESSION: Bronchitic changes with minimal LEFT basilar atelectasis. Electronically Signed   By: Lavonia Dana M.D.   On: 10/25/2018 13:17        Scheduled Meds: . diltiazem  120 mg Oral Daily  . gabapentin  300 mg Oral Daily  . insulin aspart  0-5 Units Subcutaneous QHS  . insulin aspart  0-9 Units Subcutaneous TID WC  . latanoprost  1 drop Both Eyes QHS  . metoprolol tartrate  12.5 mg Oral BID  . mirtazapine  7.5 mg Oral QHS  . mometasone-formoterol  2 puff Inhalation BID  . multivitamin with minerals  1 tablet Oral Daily  . oxybutynin  5 mg Oral QHS  . pantoprazole  40 mg Oral BID   . warfarin  5 mg Oral ONCE-1800  . Warfarin - Pharmacist Dosing Inpatient   Does not apply q1800   Continuous Infusions: . sodium chloride 75 mL/hr at 10/26/18 1148  . [START ON 10/27/2018] cefTRIAXone (ROCEPHIN)  IV       LOS: 0 days    Time spent: 35 mins.More than 50% of that time was spent in counseling and/or coordination of care.      Shelly Coss, MD [facility] Pager [phone]  If 7PM-7AM, please contact night-coverage [email or web address] Password [email or web address] 10/26/2018, 1:23 PM

## 2018-10-26 NOTE — Progress Notes (Signed)
Spoke with Dr. Waldron Labs about any additional orders for tonight. Ordered to continue with orders as they are until the morning. Pt fluid change from 100 ml/hr to 75 ml/hr. Will continue to monitor.

## 2018-10-26 NOTE — Progress Notes (Signed)
Report received from Mardene Celeste, [place]. Pt arrived to 4E22 from the ED. CHG complete. Vitals taken. Tele applied and CCMD notified. Initial assessment complete. Pt oriented to room and call bell system. Will continue to monitor.

## 2018-10-26 NOTE — Evaluation (Signed)
Clinical/Bedside Swallow Evaluation Patient Details  Name: [name] [name] MRN: [id number] Date of Birth: [birth year]  Today's Date: 10/26/2018 Time: SLP Start Time (ACUTE ONLY): 0858 SLP Stop Time (ACUTE ONLY): 0915 SLP Time Calculation (min) (ACUTE ONLY): 17 min  Past Medical History:  Past Medical History:  Diagnosis Date  . Atrial fibrillation (HCC)    RVR hx.  Chronic Coumadin  . C. difficile colitis 09/21/2013, 11/2013  . Carotid stenosis    a. Carotid US (10/2013):  bilat 1-39%; f/u 1 year  . Chronic lower back pain   . COPD (chronic obstructive pulmonary disease) ([place])   . Depression   . Diverticulosis of colon without hemorrhage   . GERD (gastroesophageal reflux disease)    with HH  . Glaucoma   . HCAP (healthcare-associated pneumonia) 11/21/2011  . High cholesterol   . Hypertension   . Metabolic encephalopathy 4/0/9811  . Mitral regurgitation 03/11/2012  . Pancreatitis    ~2008, 11/2011 post ERCP  . Pneumonia 12/2011   "first time I know about"  . Sepsis ([facility] [place]) 11/27/2013  . SIRS (systemic inflammatory response syndrome) ([place]) 03/13/2012   a/w post ERCP  pancreatitis.   . Syncope   . Type II diabetes mellitus ([place])    Past Surgical History:  Past Surgical History:  Procedure Laterality Date  . APPENDECTOMY  ~ 1960  . CATARACT EXTRACTION W/ INTRAOCULAR LENS  IMPLANT, BILATERAL  2000's  . CHOLECYSTECTOMY  1990's   Lap chole  . COLONOSCOPY  01/2003   diverticulosis, 55mm hyperplastic sigmoid polyp  . COLONOSCOPY WITH PROPOFOL N/A 06/02/2018   Procedure: COLONOSCOPY WITH PROPOFOL;  Surgeon: Lavena Bullion, DO;  Location: [facility];  Service: Gastroenterology;  Laterality: N/A;  . DILATION AND CURETTAGE OF UTERUS    . ERCP  11/13/2011   Procedure: ENDOSCOPIC RETROGRADE CHOLANGIOPANCREATOGRAPHY (ERCP);  Surgeon: Beryle Beams, MD;  Location: [facility] ENDOSCOPY;  Service: Endoscopy;  Laterality: N/A;  . FLEXIBLE SIGMOIDOSCOPY N/A 05/02/2014   Procedure: FLEXIBLE  SIGMOIDOSCOPY;  Surgeon: Gatha Mayer, MD;  Location: [facility] ENDOSCOPY;  Service: Endoscopy;  Laterality: N/A;  . HIP ARTHROPLASTY Left 01/07/2018   Procedure: ARTHROPLASTY BIPOLAR HIP (HEMIARTHROPLASTY);  Surgeon: Hiram Gash, MD;  Location: [facility];  Service: Orthopedics;  Laterality: Left;  . TONSILLECTOMY AND ADENOIDECTOMY  ~ 1939  . VAGINAL HYSTERECTOMY  ~ 1960's   HPI:  Pt is an 85 yo female admitted with sepsis from UTI. Pt had an esophogram 10/21/18 that showed severe esophageal dysmotility and concern for stricture. Oropharyngeal swallow was described as normal on that study. CXR upon admission showed bronchitic changes with minimal L basilar atelectasis but no focal infiltrate. PMH: chronic A. fib, on warfarin, carotid stenosis, COPD, GERD, hypertension, type 2 diabetes mellitus, insulin-dependent, PNA   Assessment / Plan / Recommendation Clinical Impression  Pt presents with a functional appearing oropharyngeal swallow, which is further supported by recent esophogram, which commends on her pharyngeal phase as normal. It does however describe significant esophageal dysmotility and potential stricture, not allowing barium tablet to pass into the stomach. Therefore, recommend Dys 3 diet, thin liquids, and meds crushed in puree. Pt can select softer foods per her preference. Education was provided on esophageal and aspiration precautions. Would consider GI consult if further w/u is warranted. SLP Visit Diagnosis: Dysphagia, unspecified (R13.10)    Aspiration Risk  Mild aspiration risk(primarily post-prandial)    Diet Recommendation Dysphagia 3 (Mech soft);Thin liquid   Liquid Administration via: Cup;Straw Medication Administration: Crushed with puree Supervision: Patient able  to self feed;Intermittent supervision to cue for compensatory strategies Compensations: Slow rate;Small sips/bites;Follow solids with liquid Postural Changes: Seated upright at 90 degrees;Remain upright for at least 30  minutes after po intake    Other  Recommendations Oral Care Recommendations: Oral care BID   Follow up Recommendations None      Frequency and Duration            Prognosis Prognosis for Safe Diet Advancement: Fair Barriers to Reach Goals: Other (Comment)(esophageal component)      Swallow Study   General HPI: Pt is an 85 yo female admitted with sepsis from UTI. Pt had an esophogram 10/21/18 that showed severe esophageal dysmotility and concern for stricture. Oropharyngeal swallow was described as normal on that study. CXR upon admission showed bronchitic changes with minimal L basilar atelectasis but no focal infiltrate. PMH: chronic A. fib, on warfarin, carotid stenosis, COPD, GERD, hypertension, type 2 diabetes mellitus, insulin-dependent, PNA Type of Study: Bedside Swallow Evaluation Previous Swallow Assessment: BSE in 2013 recommendinf Dys 3 diet, thin liquids Diet Prior to this Study: NPO Temperature Spikes Noted: Yes Respiratory Status: Nasal cannula History of Recent Intubation: No Behavior/Cognition: Alert;Cooperative;Pleasant mood Oral Cavity Assessment: Within Functional Limits Oral Care Completed by SLP: No Oral Cavity - Dentition: Adequate natural dentition Vision: Functional for self-feeding Self-Feeding Abilities: Able to feed self Patient Positioning: Upright in bed Baseline Vocal Quality: Normal Volitional Cough: Strong Volitional Swallow: Able to elicit    Oral/Motor/Sensory Function Overall Oral Motor/Sensory Function: Within functional limits   Ice Chips Ice chips: Not tested   Thin Liquid Thin Liquid: Within functional limits Presentation: Self Fed;Cup;Straw    Nectar Thick Nectar Thick Liquid: Not tested   Honey Thick Honey Thick Liquid: Not tested   Puree Puree: Within functional limits Presentation: Self Fed;Spoon   Solid     Solid: Within functional limits Presentation: Self Ennis Forts 10/26/2018,9:24 AM  Germain Osgood, M.A. [place] [facility] Environmental education officer [phone] Office [phone]

## 2018-10-26 NOTE — Progress Notes (Signed)
Dr. Hilbert Bible page about patients blood pressure (see flow sheet). Orders given to titrate cardizem drip down to 5 and monitor pt tolerance for now. Will continue to monitor.

## 2018-10-26 NOTE — Progress Notes (Signed)
PHARMACY - PHYSICIAN COMMUNICATION CRITICAL VALUE ALERT - BLOOD CULTURE IDENTIFICATION (BCID)  [name] [name] is an 85 y.o. female who presented to [facility] on 10/25/2018 with a chief complaint of urosepsis  Assessment:  Blood culture growing E. Coli  Name of physician (or Provider) Contacted: Raliegh Ip Schorr  Current antibiotics:  Rocephin 1 g IV daily  Changes to prescribed antibiotics recommended:   Increase Rocephin 2 g IV daily  Results for orders placed or performed during the hospital encounter of 10/25/18  Blood Culture ID Panel (Reflexed) (Collected: 10/25/2018 12:55 PM)  Result Value Ref Range   Enterococcus species NOT DETECTED NOT DETECTED   Listeria monocytogenes NOT DETECTED NOT DETECTED   Staphylococcus species NOT DETECTED NOT DETECTED   Staphylococcus aureus (BCID) NOT DETECTED NOT DETECTED   Streptococcus species NOT DETECTED NOT DETECTED   Streptococcus agalactiae NOT DETECTED NOT DETECTED   Streptococcus pneumoniae NOT DETECTED NOT DETECTED   Streptococcus pyogenes NOT DETECTED NOT DETECTED   Acinetobacter baumannii NOT DETECTED NOT DETECTED   Enterobacteriaceae species DETECTED (A) NOT DETECTED   Enterobacter cloacae complex NOT DETECTED NOT DETECTED   Escherichia coli DETECTED (A) NOT DETECTED   Klebsiella oxytoca NOT DETECTED NOT DETECTED   Klebsiella pneumoniae NOT DETECTED NOT DETECTED   Proteus species NOT DETECTED NOT DETECTED   Serratia marcescens NOT DETECTED NOT DETECTED   Carbapenem resistance NOT DETECTED NOT DETECTED   Haemophilus influenzae NOT DETECTED NOT DETECTED   Neisseria meningitidis NOT DETECTED NOT DETECTED   Pseudomonas aeruginosa NOT DETECTED NOT DETECTED   Candida albicans NOT DETECTED NOT DETECTED   Candida glabrata NOT DETECTED NOT DETECTED   Candida krusei NOT DETECTED NOT DETECTED   Candida parapsilosis NOT DETECTED NOT DETECTED   Candida tropicalis NOT DETECTED NOT DETECTED    [name] 10/26/2018  3:50 AM

## 2018-10-27 ENCOUNTER — Encounter (HOSPITAL_COMMUNITY): Payer: Self-pay | Admitting: General Practice

## 2018-10-27 LAB — BASIC METABOLIC PANEL
Anion gap: 7 (ref 5–15)
BUN: 12 mg/dL (ref 8–23)
CO2: 17 mmol/L — ABNORMAL LOW (ref 22–32)
Calcium: 8.1 mg/dL — ABNORMAL LOW (ref 8.9–10.3)
Chloride: 114 mmol/L — ABNORMAL HIGH (ref 98–111)
Creatinine, Ser: 0.98 mg/dL (ref 0.44–1.00)
GFR calc Af Amer: >60 mL/min (ref >60)
GFR, EST NON AFRICAN AMERICAN: 52 mL/min — AB (ref >60)
Glucose, Bld: 154 mg/dL — ABNORMAL HIGH (ref 70–99)
Potassium: 4.1 mmol/L (ref 3.5–5.1)
SODIUM: 138 mmol/L (ref 135–145)

## 2018-10-27 LAB — CBC WITH DIFFERENTIAL/PLATELET
Abs Immature Granulocytes: 0.06 10*3/uL (ref 0.00–0.07)
BASOS PCT: 1 %
Basophils Absolute: 0.1 10*3/uL (ref 0.0–0.1)
Eosinophils Absolute: 0.2 10*3/uL (ref 0.0–0.5)
Eosinophils Relative: 2 %
HCT: 34 % — ABNORMAL LOW (ref 36.0–46.0)
Hemoglobin: 11.2 g/dL — ABNORMAL LOW (ref 12.0–15.0)
Immature Granulocytes: 1 %
Lymphocytes Relative: 21 %
Lymphs Abs: 2.3 10*3/uL (ref 0.7–4.0)
MCH: 31.1 pg (ref 26.0–34.0)
MCHC: 32.9 g/dL (ref 30.0–36.0)
MCV: 94.4 fL (ref 80.0–100.0)
Monocytes Absolute: 0.6 10*3/uL (ref 0.1–1.0)
Monocytes Relative: 6 %
NEUTROS PCT: 69 %
Neutro Abs: 7.7 10*3/uL (ref 1.7–7.7)
Platelets: 167 10*3/uL (ref 150–400)
RBC: 3.6 MIL/uL — AB (ref 3.87–5.11)
RDW: 15 % (ref 11.5–15.5)
WBC: 10.9 10*3/uL — ABNORMAL HIGH (ref 4.0–10.5)
nRBC: 0 % (ref 0.0–0.2)

## 2018-10-27 LAB — PROTIME-INR
INR: 2.49
Prothrombin Time: 26.6 seconds — ABNORMAL HIGH (ref 11.4–15.2)

## 2018-10-27 LAB — URINE CULTURE

## 2018-10-27 LAB — GLUCOSE, CAPILLARY
Glucose-Capillary: 89 mg/dL (ref 70–99)
Glucose-Capillary: 192 mg/dL — ABNORMAL HIGH (ref 70–99)
Glucose-Capillary: 183 mg/dL — ABNORMAL HIGH (ref 70–99)
Glucose-Capillary: 147 mg/dL — ABNORMAL HIGH (ref 70–99)

## 2018-10-27 MED ORDER — WARFARIN SODIUM 2.5 MG PO TABS
2.5000 mg | ORAL_TABLET | Freq: Once | ORAL | Status: AC
  Administered 2018-10-27: 2.5 mg via ORAL
  Filled 2018-10-27: qty 1

## 2018-10-27 MED ORDER — SODIUM CHLORIDE 0.9% FLUSH
10.0000 mL | INTRAVENOUS | Status: DC | PRN
  Administered 2018-11-04: 10 mL
  Filled 2018-10-27: qty 40

## 2018-10-27 NOTE — Plan of Care (Signed)
  Problem: Education: Goal: Knowledge of General Education information will improve Description Including pain rating scale, medication(s)/side effects and non-pharmacologic comfort measures Outcome: Progressing   

## 2018-10-27 NOTE — Progress Notes (Signed)
Physical Therapy Evaluation Patient Details Name: [name] [name] MRN: [id number] DOB: [birth year] Today's Date: 10/27/2018   History of Present Illness  Patient 85 y/o female presenting to hospital 1/21 with fever, chills, and incontinence seconday to bacteremia/sepsis. Patient also has AMS due diagnosis and a UTI. PMH includes DMII, COPD, HTN, a fib, syncope, and carotid stenosis. Patient has hx of recent fall resulting in ankle/toe fx and is suppose to wear walking boot when out of house  Clinical Impression  Patient admitted to hospital secondary to above problems with deficits below. Patient required minA-modA for sit to stand transfer and min guard-minA for stand pivot transfer with RW. Further mobility limited due to baseline dizziness. Patient has had increase in falls last few months and has become weaker recently. Patient is at increased risk of falls therefore recommending SNF level therapy to optimize mobility and strength. Patient will benefit from acute physical therapy services to maximize independence and safety with functional mobility.     Follow Up Recommendations SNF;Supervision for mobility/OOB    Equipment Recommendations  None recommended by PT    Recommendations for Other Services       Precautions / Restrictions Precautions Precautions: Fall Required Braces or Orthoses: Other Brace(wears walking boot but not for household ambulation) Restrictions Weight Bearing Restrictions: No      Mobility  Bed Mobility Overal bed mobility: Needs Assistance             General bed mobility comments: Patient required supervision with bed mobility for safety with HOB elevated. Increased dizziness when sitting EOB with mild sway.  Transfers Overall transfer level: Needs assistance Equipment used: Rolling walker (2 wheeled) Transfers: Sit to/from [facility] Sit to Stand: Min assist;Mod assist Stand pivot transfers: Min guard;Min assist        General transfer comment: Patient required minA for lift to stand from elevated surface and modA from lower surface with RW. Patient was min guard to minA for stand pivot transfer for steadying and safety. Verbal cues for hand placement using RW. Patient became dizzy upon standing but was able to complete stand pivot transfer on second trial standing. Patient with baseline dizziness with mobility   Ambulation/Gait                Stairs            Wheelchair Mobility    Modified Rankin (Stroke Patients Only)       Balance Overall balance assessment: Needs assistance Sitting-balance support: Feet supported;No upper extremity supported Sitting balance-Leahy Scale: Fair Sitting balance - Comments: Patient had increased dizziness while sitting that has been occuring for last few months. Required supervision for safety while sitting due to dizziness   Standing balance support: Bilateral upper extremity supported Standing balance-Leahy Scale: Poor Standing balance comment: reliant on BUE and RW to maintain standing balance                             Pertinent Vitals/Pain Pain Assessment: Faces Faces Pain Scale: No hurt    Home Living Family/patient expects to be discharged to:: Private residence Living Arrangements: Spouse/significant other Available Help at Discharge: Family;Available 24 hours/day Type of Home: House Home Access: Ramped entrance     Home Layout: One level Home Equipment: Walker - 4 wheels;Shower seat;Toilet riser;Grab bars - tub/shower;Hand held shower head;Wheelchair - manual Additional Comments: Some house information taken from previous admission    Prior Function Level of Independence: Needs  assistance   Gait / Transfers Assistance Needed: ambulates with rollator but uses wheelchair for longer distances  ADL's / Homemaking Assistance Needed: Has help with ADLs and IADLs        Hand Dominance        Extremity/Trunk  Assessment   Upper Extremity Assessment Upper Extremity Assessment: Overall WFL for tasks assessed    Lower Extremity Assessment Lower Extremity Assessment: Generalized weakness    Cervical / Trunk Assessment Cervical / Trunk Assessment: Kyphotic  Communication   Communication: No difficulties  Cognition Arousal/Alertness: Awake/alert Behavior During Therapy: WFL for tasks assessed/performed Overall Cognitive Status: Within Functional Limits for tasks assessed                                        General Comments General comments (skin integrity, edema, etc.): Patient reports dizziness while sitting or standing that has occured for last few months. Patient on 2L of oxygen during session. Patient husband in room.     Exercises     Assessment/Plan    PT Assessment Patient needs continued PT services  PT Problem List Decreased strength;Decreased range of motion;Decreased activity tolerance;Decreased balance;Decreased mobility;Decreased knowledge of use of DME;Decreased knowledge of precautions;Decreased safety awareness       PT Treatment Interventions DME instruction;Gait training;Functional mobility training;Therapeutic activities;Therapeutic exercise;Balance training;Patient/family education    PT Goals (Current goals can be found in the Care Plan section)  Acute Rehab PT Goals Patient Stated Goal: go home PT Goal Formulation: With patient Time For Goal Achievement: 11/10/18 Potential to Achieve Goals: Fair    Frequency Min 2X/week   Barriers to discharge        Co-evaluation               AM-PAC PT "6 Clicks" Mobility  Outcome Measure Help needed turning from your back to your side while in a flat bed without using bedrails?: A Little Help needed moving from lying on your back to sitting on the side of a flat bed without using bedrails?: A Little Help needed moving to and from a bed to a chair (including a wheelchair)?: A Little Help  needed standing up from a chair using your arms (e.g., wheelchair or bedside chair)?: A Little Help needed to walk in hospital room?: A Lot Help needed climbing 3-5 steps with a railing? : A Lot 6 Click Score: 16    End of Session Equipment Utilized During Treatment: Gait belt;Oxygen Activity Tolerance: Treatment limited secondary to medical complications (Comment)(limited due to dizziness) Patient left: in chair;with call bell/phone within reach;with chair alarm set;with family/visitor present Nurse Communication: Mobility status PT Visit Diagnosis: Unsteadiness on feet (R26.81);Difficulty in walking, not elsewhere classified (R26.2);History of falling (Z91.81);Dizziness and giddiness (R42)    Time: 2800-3491 PT Time Calculation (min) (ACUTE ONLY): 32 min   Charges:   PT Evaluation $PT Eval Moderate Complexity: 1 Mod PT Treatments $Gait Training: 8-22 mins        Erick Blinks, SPT  Erick Blinks 10/27/2018, 5:10 PM

## 2018-10-27 NOTE — Progress Notes (Signed)
ANTICOAGULATION CONSULT NOTE  Pharmacy Consult for warfarin Indication: atrial fibrillation  Patient Measurements: Height: 5' (152.4 cm) Weight: 139 lb 8.8 oz (63.3 kg) IBW/kg (Calculated) : 45.5   Vital Signs: Temp: 97.9 F (36.6 C) (01/23 0803) Temp Source: Oral (01/23 0803) BP: 143/70 (01/23 0859) Pulse Rate: 99 (01/23 0859)  Labs: Recent Labs    10/25/18 1233 10/26/18 0350 10/27/18 0356  HGB 14.4 11.2* 11.2*  HCT 44.8 34.8* 34.0*  PLT 245 170 167  LABPROT 21.8* 22.4* 26.6*  INR 1.93 1.99 2.49  CREATININE 1.14* 0.99 0.98     Medical History: Past Medical History:  Diagnosis Date  . Atrial fibrillation (HCC)    RVR hx.  Chronic Coumadin  . C. difficile colitis 09/21/2013, 11/2013  . Carotid stenosis    a. Carotid US (10/2013):  bilat 1-39%; f/u 1 year  . Chronic lower back pain   . COPD (chronic obstructive pulmonary disease) ([place])   . Depression   . Diverticulosis of colon without hemorrhage   . GERD (gastroesophageal reflux disease)    with HH  . Glaucoma   . HCAP (healthcare-associated pneumonia) 11/21/2011  . High cholesterol   . Hypertension   . Metabolic encephalopathy 11/12/32  . Mitral regurgitation 03/11/2012  . Pancreatitis    ~2008, 11/2011 post ERCP  . Pneumonia 12/2011   "first time I know about"  . Sepsis ([place]) 11/27/2013  . SIRS (systemic inflammatory response syndrome) ([place]) 03/13/2012   a/w post ERCP  pancreatitis.   . Syncope   . Type II diabetes mellitus [facility])     Assessment:  85 yo female with recent fall + resultant ankle fracture noted with ecoli bacteremia. She has AFib and is on warfarin prior to admission. -INR 1.99>>2.49  home warfarin regimen: 2.5mg /d except take 5mg  MWF  Goal of Therapy:  INR 2-3 Monitor platelets by anticoagulation protocol: Yes    Plan:  -warfarin 2.5 mg po x1 -daily INR  Hildred Laser, PharmD Clinical Pharmacist **Pharmacist phone directory can now be found on [email or web address] ([email or web address]).  Listed under [place].

## 2018-10-27 NOTE — Progress Notes (Addendum)
PROGRESS NOTE    [name] [name]  YYQ:[id number] DOB: May 31, 1930 DOA: 10/25/2018 PCP: [name], MD   Brief Narrative: Patient is 85 year old female with past medical history of chronic A. fib on warfarin, carotid stenosis, COPD, GERD, hypertension, type 2 diabetes, C. difficile who presents to the emergency  department with complaints of fever, chills, dysuria and incontinence.  She is from home and lives with her husband.  She ambulates with the help of rolling walker.  When she presented she was febrile with temperature of 101.9. ,  Had leukocytosis.  UA was abnormal.  Urinary tract infection was suspected on presentation.  Currently blood cultures showing E. coli.  Started on ceftriaxone.  Currently hemodynamically stable.  Assessment & Plan:   Principal Problem:   Bacteremia Active Problems:   UTI (urinary tract infection)   DM II (diabetes mellitus, type II), controlled (HCC)   HTN (hypertension)   Atrial fibrillation    Anxiety   COPD (chronic obstructive pulmonary disease) (HCC)   DM neuropathy, type II diabetes mellitus (HCC)   SIRS (systemic inflammatory response syndrome) (HCC)  Gram-negative bacteremia/sepsis present on admission: Presented with fever, tachycardia, lactic acidosis.  She was confused on presentation. UA was abnormal. Blood cultures showing E. coli. Likely source is urinary.  Currently on ceftriaxone 2 g daily.  Currently she is afebrile.  Hemodynamically stable.  We will continue ceftriaxone until we get the final sensitivity.  Altered mental status: Metabolic encephalopathy due to sepsis.  Mental status has improved this morning.  Currently she is alert and oriented.  Chronic A. fib with RVR: Heart rate better. She was given Cardizem IV push and was started on Cardizem drip on presentation.  She is on oral Cardizem and metoprolol at home which we will continue..  On warfarin for anticoagulation.  Continue monitoring INR daily.  Dysphagia: She has  history of dysphagia.  She follows with GI as an outpatient.  Speech therapy already evaluated her and recommended dysphagia 3 diet.  COPD: Currently stable.  Continue inhalers and bronchodilators.  Type 2 diabetes mellitus: On insulin at home.  Lantus and sliding scale insulin will be continued.  Hypertension: Currently blood pressure stable.  We will continue to monitor.  Deconditioning/debility/recent foot fracture: Physical therapy will evaluate her.          DVT prophylaxis:Warfarin Code Status: Full code Family Communication: Discussed with husband on the phone today Disposition Plan: Pending PT evaluation.  Medically not ready for discharge   Consultants: None  Procedures: None  Antimicrobials:  Anti-infectives (From admission, onward)   Start     Dose/Rate Route Frequency Ordered Stop   10/27/18 0800  cefTRIAXone (ROCEPHIN) 1 g in sodium chloride 0.9 % 100 mL IVPB  Status:  Discontinued     1 g 200 mL/hr over 30 Minutes Intravenous Every 24 hours 10/26/18 0400 10/26/18 0937   10/27/18 0800  cefTRIAXone (ROCEPHIN) 2 g in sodium chloride 0.9 % 100 mL IVPB     2 g 200 mL/hr over 30 Minutes Intravenous Every 24 hours 10/26/18 0937     10/26/18 1400  cefTRIAXone (ROCEPHIN) 1 g in sodium chloride 0.9 % 100 mL IVPB  Status:  Discontinued     1 g 200 mL/hr over 30 Minutes Intravenous Every 24 hours 10/25/18 1556 10/25/18 1625   10/26/18 1400  cefTRIAXone (ROCEPHIN) 1 g in sodium chloride 0.9 % 100 mL IVPB  Status:  Discontinued     1 g 200 mL/hr over 30 Minutes Intravenous Every 24  hours 10/25/18 1625 10/26/18 0400   10/26/18 0430  cefTRIAXone (ROCEPHIN) 2 g in sodium chloride 0.9 % 100 mL IVPB     2 g 200 mL/hr over 30 Minutes Intravenous  Once 10/26/18 0400 10/26/18 0757   10/25/18 1630  cefTRIAXone (ROCEPHIN) 1 g in sodium chloride 0.9 % 100 mL IVPB  Status:  Discontinued     1 g 200 mL/hr over 30 Minutes Intravenous  Once 10/25/18 1625 10/25/18 1643   10/25/18 1500   cefTRIAXone (ROCEPHIN) 1 g in sodium chloride 0.9 % 100 mL IVPB     1 g 200 mL/hr over 30 Minutes Intravenous  Once 10/25/18 1457 10/25/18 1604      Subjective: Patient seen and examined at bedside this morning.  Hemodynamically stable.  Afebrile.  Still found to be weak.  Lies on the bed most of the time.  Alert and oriented.  Asking when she can go home. Objective: Vitals:   10/27/18 0003 10/27/18 0442 10/27/18 0803 10/27/18 0859  BP: 102/61 118/69 (!) 143/70 (!) 143/70  Pulse: 85 97 (!) 108 99  Resp: 15 16 18 17   Temp: 97.8 F (36.6 C) 97.7 F (36.5 C) 97.9 F (36.6 C)   TempSrc: Oral Axillary Oral   SpO2: 96% 97% 96% 97%  Weight:      Height:        Intake/Output Summary (Last 24 hours) at 10/27/2018 1139 Last data filed at 10/27/2018 0930 Gross per 24 hour  Intake 1165.86 ml  Output 600 ml  Net 565.86 ml   Filed Weights   10/25/18 1219 10/25/18 2015  Weight: 68 kg 63.3 kg    Examination:  General exam: Elderly debilitated female  HEENT:PERRL,Oral mucosa moist, Ear/Nose normal on gross exam Respiratory system: Bilateral equal air entry, normal vesicular breath sounds, no wheezes or crackles  Cardiovascular system: A. fib , no JVD, murmurs, rubs, gallops or clicks. Gastrointestinal system: Abdomen is nondistended, soft and nontender. No organomegaly or masses felt. Normal bowel sounds heard. Central nervous system: Alert and oriented. No focal neurological deficits.  Generalized weakness Extremities: No edema, no clubbing ,no cyanosis, distal peripheral pulses palpable. Skin: No rashes, lesions or ulcers,no icterus ,no pallor     Data Reviewed: I have personally reviewed following labs and imaging studies  CBC: Recent Labs  Lab 10/25/18 1233 10/26/18 0350 10/27/18 0356  WBC 20.6* 17.3* 10.9*  NEUTROABS 19.1*  --  7.7  HGB 14.4 11.2* 11.2*  HCT 44.8 34.8* 34.0*  MCV 93.9 92.3 94.4  PLT 245 170 341   Basic Metabolic Panel: Recent Labs  Lab  10/25/18 1233 10/26/18 0350 10/27/18 0356  NA 137 139 138  K 3.7 3.4* 4.1  CL 106 113* 114*  CO2 17* 17* 17*  GLUCOSE 207* 109* 154*  BUN 13 14 12   CREATININE 1.14* 0.99 0.98  CALCIUM 9.4 8.0* 8.1*   GFR: Estimated Creatinine Clearance: 34.2 mL/min (by C-G formula based on SCr of 0.98 mg/dL). Liver Function Tests: Recent Labs  Lab 10/25/18 1233  AST 33  ALT 18  ALKPHOS 89  BILITOT 1.2  PROT 7.2  ALBUMIN 3.5   No results for input(s): LIPASE, AMYLASE in the last 168 hours. No results for input(s): AMMONIA in the last 168 hours. Coagulation Profile: Recent Labs  Lab 10/25/18 1233 10/26/18 0350 10/27/18 0356  INR 1.93 1.99 2.49   Cardiac Enzymes: No results for input(s): CKTOTAL, CKMB, CKMBINDEX, TROPONINI in the last 168 hours. BNP (last 3 results) No results for  input(s): PROBNP in the last 8760 hours. HbA1C: No results for input(s): HGBA1C in the last 72 hours. CBG: Recent Labs  Lab 10/26/18 1111 10/26/18 1640 10/26/18 2057 10/27/18 0550 10/27/18 1111  GLUCAP 114* 117* 172* 89 192*   Lipid Profile: No results for input(s): CHOL, HDL, LDLCALC, TRIG, CHOLHDL, LDLDIRECT in the last 72 hours. Thyroid Function Tests: No results for input(s): TSH, T4TOTAL, FREET4, T3FREE, THYROIDAB in the last 72 hours. Anemia Panel: No results for input(s): VITAMINB12, FOLATE, FERRITIN, TIBC, IRON, RETICCTPCT in the last 72 hours. Sepsis Labs: Recent Labs  Lab 10/25/18 1330 10/25/18 1517  LATICACIDVEN 2.0* 2.2*    Recent Results (from the past 240 hour(s))  Culture, blood (routine x 2)     Status: Abnormal (Preliminary result)   Collection Time: 10/25/18 12:50 PM  Result Value Ref Range Status   Specimen Description BLOOD BLOOD RIGHT HAND  Final   Special Requests   Final    BOTTLES DRAWN AEROBIC AND ANAEROBIC Blood Culture adequate volume   Culture  Setup Time   Final    GRAM NEGATIVE RODS AEROBIC BOTTLE ONLY CRITICAL VALUE NOTED.  VALUE IS CONSISTENT WITH  PREVIOUSLY REPORTED AND CALLED VALUE.    Culture (A)  Final    ESCHERICHIA COLI SUSCEPTIBILITIES TO FOLLOW Performed at [facility], [place] [address], [place], [place] [address]    Report Status PENDING  Incomplete  Culture, blood (routine x 2)     Status: Abnormal (Preliminary result)   Collection Time: 10/25/18 12:55 PM  Result Value Ref Range Status   Specimen Description BLOOD BLOOD LEFT HAND  Final   Special Requests   Final    BOTTLES DRAWN AEROBIC AND ANAEROBIC Blood Culture adequate volume   Culture  Setup Time   Final    GRAM NEGATIVE RODS ANAEROBIC BOTTLE ONLY CRITICAL RESULT CALLED TO, READ BACK BY AND VERIFIED WITH: Denton Brick PHARMD [id number] 10/26/2018 A BROWNING    Culture (A)  Final    ESCHERICHIA COLI SUSCEPTIBILITIES TO FOLLOW Performed at [facility], [place] [address], [place], [place] [address]    Report Status PENDING  Incomplete  Blood Culture ID Panel (Reflexed)     Status: Abnormal   Collection Time: 10/25/18 12:55 PM  Result Value Ref Range Status   Enterococcus species NOT DETECTED NOT DETECTED Final   Listeria monocytogenes NOT DETECTED NOT DETECTED Final   Staphylococcus species NOT DETECTED NOT DETECTED Final   Staphylococcus aureus (BCID) NOT DETECTED NOT DETECTED Final   Streptococcus species NOT DETECTED NOT DETECTED Final   Streptococcus agalactiae NOT DETECTED NOT DETECTED Final   Streptococcus pneumoniae NOT DETECTED NOT DETECTED Final   Streptococcus pyogenes NOT DETECTED NOT DETECTED Final   Acinetobacter baumannii NOT DETECTED NOT DETECTED Final   Enterobacteriaceae species DETECTED (A) NOT DETECTED Final    Comment: Enterobacteriaceae represent a large family of gram-negative bacteria, not a single organism. CRITICAL RESULT CALLED TO, READ BACK BY AND VERIFIED WITH: Denton Brick PHARMD [id number] 10/26/2018 A BROWNING    Enterobacter cloacae complex NOT DETECTED NOT DETECTED Final   Escherichia coli DETECTED (A) NOT DETECTED Final    Comment:  CRITICAL RESULT CALLED TO, READ BACK BY AND VERIFIED WITH: Denton Brick PHARMD [id number] 10/26/2018 A BROWNING    Klebsiella oxytoca NOT DETECTED NOT DETECTED Final   Klebsiella pneumoniae NOT DETECTED NOT DETECTED Final   Proteus species NOT DETECTED NOT DETECTED Final   Serratia marcescens NOT DETECTED NOT DETECTED Final   Carbapenem resistance  NOT DETECTED NOT DETECTED Final   Haemophilus influenzae NOT DETECTED NOT DETECTED Final   Neisseria meningitidis NOT DETECTED NOT DETECTED Final   Pseudomonas aeruginosa NOT DETECTED NOT DETECTED Final   Candida albicans NOT DETECTED NOT DETECTED Final   Candida glabrata NOT DETECTED NOT DETECTED Final   Candida krusei NOT DETECTED NOT DETECTED Final   Candida parapsilosis NOT DETECTED NOT DETECTED Final   Candida tropicalis NOT DETECTED NOT DETECTED Final    Comment: Performed at [facility], [place] [address] [place], [place] [address]         Radiology Studies: Dg Chest 2 View  Result Date: 10/25/2018 CLINICAL DATA:  Atrial fibrillation with rapid ventricular response, woke up this morning with chills, shaking, body aches, history COPD, hypertension EXAM: CHEST - 2 VIEW COMPARISON:  06/01/2018 FINDINGS: Upper normal heart size with mitral annular calcification. Atherosclerotic calcification aorta. Mediastinal contours and pulmonary vascularity normal. Bronchitic changes with minimal LEFT basilar atelectasis. Lungs otherwise clear. No pulmonary infiltrate, pleural effusion or pneumothorax. Diffuse osseous demineralization. IMPRESSION: Bronchitic changes with minimal LEFT basilar atelectasis. Electronically Signed   By: Lavonia Dana M.D.   On: 10/25/2018 13:17        Scheduled Meds: . diltiazem  120 mg Oral Daily  . gabapentin  300 mg Oral Daily  . insulin aspart  0-5 Units Subcutaneous QHS  . insulin aspart  0-9 Units Subcutaneous TID WC  . latanoprost  1 drop Both Eyes QHS  . metoprolol tartrate  12.5 mg Oral BID  . mirtazapine  7.5 mg  Oral QHS  . mometasone-formoterol  2 puff Inhalation BID  . multivitamin with minerals  1 tablet Oral Daily  . oxybutynin  5 mg Oral QHS  . pantoprazole  40 mg Oral BID  . warfarin  2.5 mg Oral ONCE-1800  . Warfarin - Pharmacist Dosing Inpatient   Does not apply q1800   Continuous Infusions: . sodium chloride Stopped (10/27/18 0932)  . cefTRIAXone (ROCEPHIN)  IV 2 g (10/27/18 0853)     LOS: 1 day    Time spent: 25 mins.More than 50% of that time was spent in counseling and/or coordination of care.      Shelly Coss, MD [facility] Pager [phone]  If 7PM-7AM, please contact night-coverage [email or web address] Password [facility] 10/27/2018, 11:39 AM

## 2018-10-28 DIAGNOSIS — R7881 Bacteremia: Secondary | ICD-10-CM

## 2018-10-28 LAB — CULTURE, BLOOD (ROUTINE X 2)
Special Requests: ADEQUATE
Special Requests: ADEQUATE

## 2018-10-28 LAB — GLUCOSE, CAPILLARY
Glucose-Capillary: 108 mg/dL — ABNORMAL HIGH (ref 70–99)
Glucose-Capillary: 180 mg/dL — ABNORMAL HIGH (ref 70–99)
Glucose-Capillary: 172 mg/dL — ABNORMAL HIGH (ref 70–99)
Glucose-Capillary: 144 mg/dL — ABNORMAL HIGH (ref 70–99)

## 2018-10-28 LAB — PROTIME-INR
INR: 3.05
Prothrombin Time: 31.1 seconds — ABNORMAL HIGH (ref 11.4–15.2)

## 2018-10-28 NOTE — Progress Notes (Signed)
PROGRESS NOTE    [name] [name]  OZD:[id number] DOB: 04/13/1930 DOA: 10/25/2018 PCP: [name], MD   Brief Narrative: Patient is 85 year old female with past medical history of chronic A. fib on warfarin, carotid stenosis, COPD, GERD, hypertension, type 2 diabetes, C. difficile who presents to the emergency  department with complaints of fever, chills, dysuria and incontinence.  She is from home and lives with her husband.  She ambulates with the help of rolling walker.  When she presented she was febrile with temperature of 101.9. ,  Had leukocytosis.  UA was abnormal.  Urinary tract infection was suspected on presentation.  Currently blood cultures showing E. coli.  Started on ceftriaxone.  Currently hemodynamically stable.  10/28/2018: Patient seen.  No significant history from patient.  Patient remains drowsy/lethargic.  Urine and blood culture results noted.  Continue IV antibiotics for now.  Assessment & Plan:   Principal Problem:   Bacteremia Active Problems:   UTI (urinary tract infection)   DM II (diabetes mellitus, type II), controlled (HCC)   HTN (hypertension)   Atrial fibrillation    Anxiety   COPD (chronic obstructive pulmonary disease) (HCC)   DM neuropathy, type II diabetes mellitus (HCC)   SIRS (systemic inflammatory response syndrome) (HCC)  UTI/sepsis secondary to E. coli:  Presented with fever, tachycardia, lactic acidosis.  She was confused on presentation. UA was abnormal. Blood cultures showing E. coli. Likely source is urinary.  Currently on ceftriaxone 2 g daily.  Currently she is afebrile.  Hemodynamically stable.  We will continue ceftriaxone until we get the final sensitivity. 10/28/2018: Urine and blood culture grew E. coli.  Continue IV ceftriaxone for now.  Culture result is noted.  Altered mental status: Metabolic encephalopathy due to sepsis.  Mental status has improved this morning.  Currently she is alert and oriented. 10/28/2018: Patient  remains drowsy/lethargic.  Continue to treat underlying sepsis.  Chronic A. fib with RVR: Heart rate better. She was given Cardizem IV push and was started on Cardizem drip on presentation.  She is on oral Cardizem and metoprolol at home which we will continue..  On warfarin for anticoagulation.  Continue monitoring INR daily.  Dysphagia: She has history of dysphagia.  She follows with GI as an outpatient.  Speech therapy already evaluated her and recommended dysphagia 3 diet.  COPD: Currently stable.  Continue inhalers and bronchodilators.  Type 2 diabetes mellitus: On insulin at home.  Lantus and sliding scale insulin will be continued.  Hypertension: Currently blood pressure stable.  We will continue to monitor.  Deconditioning/debility/recent foot fracture: Physical therapy will evaluate her.  DVT prophylaxis:Warfarin Code Status: Full code Family Communication: Discussed with husband on the phone today Disposition Plan: Pending PT evaluation.  Medically not ready for discharge   Consultants: None  Procedures: None  Antimicrobials:  Anti-infectives (From admission, onward)   Start     Dose/Rate Route Frequency Ordered Stop   10/27/18 0800  cefTRIAXone (ROCEPHIN) 1 g in sodium chloride 0.9 % 100 mL IVPB  Status:  Discontinued     1 g 200 mL/hr over 30 Minutes Intravenous Every 24 hours 10/26/18 0400 10/26/18 0937   10/27/18 0800  cefTRIAXone (ROCEPHIN) 2 g in sodium chloride 0.9 % 100 mL IVPB     2 g 200 mL/hr over 30 Minutes Intravenous Every 24 hours 10/26/18 0937     10/26/18 1400  cefTRIAXone (ROCEPHIN) 1 g in sodium chloride 0.9 % 100 mL IVPB  Status:  Discontinued     1  g 200 mL/hr over 30 Minutes Intravenous Every 24 hours 10/25/18 1556 10/25/18 1625   10/26/18 1400  cefTRIAXone (ROCEPHIN) 1 g in sodium chloride 0.9 % 100 mL IVPB  Status:  Discontinued     1 g 200 mL/hr over 30 Minutes Intravenous Every 24 hours 10/25/18 1625 10/26/18 0400   10/26/18 0430  cefTRIAXone  (ROCEPHIN) 2 g in sodium chloride 0.9 % 100 mL IVPB     2 g 200 mL/hr over 30 Minutes Intravenous  Once 10/26/18 0400 10/26/18 0757   10/25/18 1630  cefTRIAXone (ROCEPHIN) 1 g in sodium chloride 0.9 % 100 mL IVPB  Status:  Discontinued     1 g 200 mL/hr over 30 Minutes Intravenous  Once 10/25/18 1625 10/25/18 1643   10/25/18 1500  cefTRIAXone (ROCEPHIN) 1 g in sodium chloride 0.9 % 100 mL IVPB     1 g 200 mL/hr over 30 Minutes Intravenous  Once 10/25/18 1457 10/25/18 1604      Subjective: Patient seen and examined at bedside this morning.  Hemodynamically stable.  Afebrile.  Still found to be weak.  Lies on the bed most of the time.  Alert and oriented.  Asking when she can go home. Objective: Vitals:   10/27/18 2337 10/28/18 0411 10/28/18 0839 10/28/18 0843  BP: 114/64 131/66 (!) 148/77   Pulse:   96 94  Resp: 20 (!) 22 20 19   Temp: 98.3 F (36.8 C) 97.6 F (36.4 C) 98.2 F (36.8 C)   TempSrc: Oral Oral Oral   SpO2: 96% 92% 95% 96%  Weight:      Height:        Intake/Output Summary (Last 24 hours) at 10/28/2018 1346 Last data filed at 10/28/2018 1330 Gross per 24 hour  Intake 1531.88 ml  Output 1800 ml  Net -268.12 ml   Filed Weights   10/25/18 1219 10/25/18 2015  Weight: 68 kg 63.3 kg    Examination:  General exam: Elderly.  Drowsy/lethargic.   HEENT: Pallor.  No jaundice.  Respiratory system: Clear to auscultation.   Cardiovascular system: A. fib , no JVD, murmurs, rubs, gallops or clicks. Gastrointestinal system: Abdomen is nondistended, soft and nontender. No organomegaly or masses felt. Normal bowel sounds heard. Central nervous system: Drowsy/lethargic.   Extremities: Swollen right upper extremity from infiltrated IV site.    Data Reviewed: I have personally reviewed following labs and imaging studies  CBC: Recent Labs  Lab 10/25/18 1233 10/26/18 0350 10/27/18 0356  WBC 20.6* 17.3* 10.9*  NEUTROABS 19.1*  --  7.7  HGB 14.4 11.2* 11.2*  HCT 44.8  34.8* 34.0*  MCV 93.9 92.3 94.4  PLT 245 170 419   Basic Metabolic Panel: Recent Labs  Lab 10/25/18 1233 10/26/18 0350 10/27/18 0356  NA 137 139 138  K 3.7 3.4* 4.1  CL 106 113* 114*  CO2 17* 17* 17*  GLUCOSE 207* 109* 154*  BUN 13 14 12   CREATININE 1.14* 0.99 0.98  CALCIUM 9.4 8.0* 8.1*   GFR: Estimated Creatinine Clearance: 34.2 mL/min (by C-G formula based on SCr of 0.98 mg/dL). Liver Function Tests: Recent Labs  Lab 10/25/18 1233  AST 33  ALT 18  ALKPHOS 89  BILITOT 1.2  PROT 7.2  ALBUMIN 3.5   No results for input(s): LIPASE, AMYLASE in the last 168 hours. No results for input(s): AMMONIA in the last 168 hours. Coagulation Profile: Recent Labs  Lab 10/25/18 1233 10/26/18 0350 10/27/18 0356 10/28/18 0407  INR 1.93 1.99 2.49 3.05  Cardiac Enzymes: No results for input(s): CKTOTAL, CKMB, CKMBINDEX, TROPONINI in the last 168 hours. BNP (last 3 results) No results for input(s): PROBNP in the last 8760 hours. HbA1C: No results for input(s): HGBA1C in the last 72 hours. CBG: Recent Labs  Lab 10/27/18 1111 10/27/18 1653 10/27/18 2046 10/28/18 0622 10/28/18 1142  GLUCAP 192* 183* 147* 108* 180*   Lipid Profile: No results for input(s): CHOL, HDL, LDLCALC, TRIG, CHOLHDL, LDLDIRECT in the last 72 hours. Thyroid Function Tests: No results for input(s): TSH, T4TOTAL, FREET4, T3FREE, THYROIDAB in the last 72 hours. Anemia Panel: No results for input(s): VITAMINB12, FOLATE, FERRITIN, TIBC, IRON, RETICCTPCT in the last 72 hours. Sepsis Labs: Recent Labs  Lab 10/25/18 1330 10/25/18 1517  LATICACIDVEN 2.0* 2.2*    Recent Results (from the past 240 hour(s))  Culture, blood (routine x 2)     Status: Abnormal   Collection Time: 10/25/18 12:50 PM  Result Value Ref Range Status   Specimen Description BLOOD BLOOD RIGHT HAND  Final   Special Requests   Final    BOTTLES DRAWN AEROBIC AND ANAEROBIC Blood Culture adequate volume   Culture  Setup Time   Final      GRAM NEGATIVE RODS AEROBIC BOTTLE ONLY CRITICAL VALUE NOTED.  VALUE IS CONSISTENT WITH PREVIOUSLY REPORTED AND CALLED VALUE.    Culture ESCHERICHIA COLI (A)  Final   Report Status 10/28/2018 FINAL  Final   Organism ID, Bacteria ESCHERICHIA COLI  Final      Susceptibility   Escherichia coli - MIC*    AMPICILLIN >=32 RESISTANT Resistant     CEFAZOLIN 16 SENSITIVE Sensitive     CEFEPIME <=1 SENSITIVE Sensitive     CEFTAZIDIME <=1 SENSITIVE Sensitive     CEFTRIAXONE <=1 SENSITIVE Sensitive     CIPROFLOXACIN <=0.25 SENSITIVE Sensitive     GENTAMICIN <=1 SENSITIVE Sensitive     IMIPENEM <=0.25 SENSITIVE Sensitive     TRIMETH/SULFA <=20 SENSITIVE Sensitive     AMPICILLIN/SULBACTAM >=32 RESISTANT Resistant     PIP/TAZO <=4 SENSITIVE Sensitive     Extended ESBL NEGATIVE Sensitive     * ESCHERICHIA COLI  Culture, blood (routine x 2)     Status: Abnormal   Collection Time: 10/25/18 12:55 PM  Result Value Ref Range Status   Specimen Description BLOOD BLOOD LEFT HAND  Final   Special Requests   Final    BOTTLES DRAWN AEROBIC AND ANAEROBIC Blood Culture adequate volume   Culture  Setup Time   Final    GRAM NEGATIVE RODS ANAEROBIC BOTTLE ONLY CRITICAL RESULT CALLED TO, READ BACK BY AND VERIFIED WITH: Denton Brick PHARMD [id number] 10/26/2018 A BROWNING    Culture (A)  Final    ESCHERICHIA COLI SUSCEPTIBILITIES PERFORMED ON PREVIOUS CULTURE WITHIN THE LAST 5 DAYS. Performed at [facility], [place] [address], [place], [place] [address]    Report Status 10/28/2018 FINAL  Final  Blood Culture ID Panel (Reflexed)     Status: Abnormal   Collection Time: 10/25/18 12:55 PM  Result Value Ref Range Status   Enterococcus species NOT DETECTED NOT DETECTED Final   Listeria monocytogenes NOT DETECTED NOT DETECTED Final   Staphylococcus species NOT DETECTED NOT DETECTED Final   Staphylococcus aureus (BCID) NOT DETECTED NOT DETECTED Final   Streptococcus species NOT DETECTED NOT DETECTED Final    Streptococcus agalactiae NOT DETECTED NOT DETECTED Final   Streptococcus pneumoniae NOT DETECTED NOT DETECTED Final   Streptococcus pyogenes NOT DETECTED NOT DETECTED Final  Acinetobacter baumannii NOT DETECTED NOT DETECTED Final   Enterobacteriaceae species DETECTED (A) NOT DETECTED Final    Comment: Enterobacteriaceae represent a large family of gram-negative bacteria, not a single organism. CRITICAL RESULT CALLED TO, READ BACK BY AND VERIFIED WITH: Denton Brick PHARMD [id number] 10/26/2018 A BROWNING    Enterobacter cloacae complex NOT DETECTED NOT DETECTED Final   Escherichia coli DETECTED (A) NOT DETECTED Final    Comment: CRITICAL RESULT CALLED TO, READ BACK BY AND VERIFIED WITH: Denton Brick PHARMD 1540 10/26/2018 A BROWNING    Klebsiella oxytoca NOT DETECTED NOT DETECTED Final   Klebsiella pneumoniae NOT DETECTED NOT DETECTED Final   Proteus species NOT DETECTED NOT DETECTED Final   Serratia marcescens NOT DETECTED NOT DETECTED Final   Carbapenem resistance NOT DETECTED NOT DETECTED Final   Haemophilus influenzae NOT DETECTED NOT DETECTED Final   Neisseria meningitidis NOT DETECTED NOT DETECTED Final   Pseudomonas aeruginosa NOT DETECTED NOT DETECTED Final   Candida albicans NOT DETECTED NOT DETECTED Final   Candida glabrata NOT DETECTED NOT DETECTED Final   Candida krusei NOT DETECTED NOT DETECTED Final   Candida parapsilosis NOT DETECTED NOT DETECTED Final   Candida tropicalis NOT DETECTED NOT DETECTED Final    Comment: Performed at [facility], [place] [address], [place], [place] [address]  Culture, Urine     Status: Abnormal   Collection Time: 10/25/18  3:57 PM  Result Value Ref Range Status   Specimen Description URINE, RANDOM  Final   Special Requests   Final    NONE Performed at [facility], [place] [address] [place], [place] [address]    Culture MULTIPLE SPECIES PRESENT, SUGGEST RECOLLECTION (A)  Final   Report Status 10/27/2018 FINAL  Final         Radiology  Studies: No results found.      Scheduled Meds: . diltiazem  120 mg Oral Daily  . gabapentin  300 mg Oral Daily  . insulin aspart  0-5 Units Subcutaneous QHS  . insulin aspart  0-9 Units Subcutaneous TID WC  . latanoprost  1 drop Both Eyes QHS  . metoprolol tartrate  12.5 mg Oral BID  . mirtazapine  7.5 mg Oral QHS  . mometasone-formoterol  2 puff Inhalation BID  . multivitamin with minerals  1 tablet Oral Daily  . oxybutynin  5 mg Oral QHS  . pantoprazole  40 mg Oral BID  . Warfarin - Pharmacist Dosing Inpatient   Does not apply q1800   Continuous Infusions: . sodium chloride 75 mL/hr at 10/28/18 0400  . cefTRIAXone (ROCEPHIN)  IV 2 g (10/28/18 0831)     LOS: 2 days   Time spent: [place], MD [facility] Pager [phone]  If 7PM-7AM, please contact night-coverage [email or web address] Password [email or web address] 10/28/2018, 1:46 PM

## 2018-10-28 NOTE — Plan of Care (Signed)
  Problem: Education: Goal: Knowledge of General Education information will improve Description Including pain rating scale, medication(s)/side effects and non-pharmacologic comfort measures Outcome: Progressing   Problem: Health Behavior/Discharge Planning: Goal: Ability to manage health-related needs will improve Outcome: Progressing   

## 2018-10-28 NOTE — Progress Notes (Signed)
ANTICOAGULATION CONSULT NOTE  Pharmacy Consult for warfarin Indication: atrial fibrillation  Patient Measurements: Height: 5' (152.4 cm) Weight: 139 lb 8.8 oz (63.3 kg) IBW/kg (Calculated) : 45.5   Vital Signs: Temp: 98.2 F (36.8 C) (01/24 0839) Temp Source: Oral (01/24 0839) BP: 148/77 (01/24 0839) Pulse Rate: 94 (01/24 0843)  Labs: Recent Labs    10/25/18 1233 10/26/18 0350 10/27/18 0356 10/28/18 0407  HGB 14.4 11.2* 11.2*  --   HCT 44.8 34.8* 34.0*  --   PLT 245 170 167  --   LABPROT 21.8* 22.4* 26.6* 31.1*  INR 1.93 1.99 2.49 3.05  CREATININE 1.14* 0.99 0.98  --      Medical History: Past Medical History:  Diagnosis Date  . Atrial fibrillation (HCC)    RVR hx.  Chronic Coumadin  . Bacteremia 10/2018  . C. difficile colitis 09/21/2013, 11/2013  . Carotid stenosis    a. Carotid US (10/2013):  bilat 1-39%; f/u 1 year  . Chronic lower back pain   . COPD (chronic obstructive pulmonary disease) ([place])   . Depression   . Diverticulosis of colon without hemorrhage   . GERD (gastroesophageal reflux disease)    with HH  . Glaucoma   . HCAP (healthcare-associated pneumonia) 11/21/2011  . High cholesterol   . Hypertension   . Metabolic encephalopathy 12/09/4678  . Mitral regurgitation 03/11/2012  . Pancreatitis    ~2008, 11/2011 post ERCP  . Pneumonia 12/2011   "first time I know about"  . Sepsis ([place]) 11/27/2013  . SIRS (systemic inflammatory response syndrome) ([place]) 03/13/2012   a/w post ERCP  pancreatitis.   . Syncope   . Type II diabetes mellitus [facility])     Assessment:  85 yo female with recent fall + resultant ankle fracture noted with ecoli bacteremia. She has AFib and is on warfarin prior to admission. -INR 1.99>>2.49>>3.05  home warfarin regimen: 2.5mg /d except take 5mg  MWF  Goal of Therapy:  INR 2-3 Monitor platelets by anticoagulation protocol: Yes    Plan:  -Hold warfarin due to INR rate of rise -daily INR  Hildred Laser, PharmD Clinical  Pharmacist **Pharmacist phone directory can now be found on [email or web address] ([email or web address]).  Listed under [place].

## 2018-10-28 NOTE — Care Management Important Message (Signed)
Important Message  Patient Details  Name: [name] [name] MRN: [id number] Date of Birth: 1930-06-21   Medicare Important Message Given:  Yes    [name] [name] 10/28/2018, 11:18 AM

## 2018-10-28 NOTE — Consult Note (Signed)
   [facility] CM Inpatient Consult   10/28/2018  [name] [name] [birth year] [id number]    Patient screened for potential [facility] Care Management services due to unplanned readmission risk score of 22% (high).  Spoke with inpatient RNCM. Discussed that if disposition plans change, please place [facility] Care Management consult.    Marthenia Rolling, MSN-Ed, RN,BSN [facility] Liaison [phone]

## 2018-10-28 NOTE — Plan of Care (Signed)
  Problem: Education: Goal: Knowledge of General Education information will improve Description Including pain rating scale, medication(s)/side effects and non-pharmacologic comfort measures 10/28/2018 2036 by Drenda Freeze, RN Outcome: Progressing 10/28/2018 2035 by Drenda Freeze, RN Outcome: Progressing   Problem: Health Behavior/Discharge Planning: Goal: Ability to manage health-related needs will improve 10/28/2018 2036 by Drenda Freeze, RN Outcome: Progressing 10/28/2018 2035 by Drenda Freeze, RN Outcome: Progressing

## 2018-10-29 ENCOUNTER — Inpatient Hospital Stay (HOSPITAL_COMMUNITY): Payer: Medicare HMO

## 2018-10-29 LAB — URINALYSIS, ROUTINE W REFLEX MICROSCOPIC
Bilirubin Urine: NEGATIVE
Glucose, UA: NEGATIVE mg/dL
Ketones, ur: NEGATIVE mg/dL
Nitrite: NEGATIVE
Protein, ur: NEGATIVE mg/dL
Specific Gravity, Urine: 1.008 (ref 1.005–1.030)
pH: 6 (ref 5.0–8.0)

## 2018-10-29 LAB — CBC WITH DIFFERENTIAL/PLATELET
Abs Immature Granulocytes: 0.18 10*3/uL — ABNORMAL HIGH (ref 0.00–0.07)
Basophils Absolute: 0.1 10*3/uL (ref 0.0–0.1)
Basophils Relative: 1 %
Eosinophils Absolute: 0.2 10*3/uL (ref 0.0–0.5)
Eosinophils Relative: 2 %
HCT: 34.7 % — ABNORMAL LOW (ref 36.0–46.0)
Hemoglobin: 11.5 g/dL — ABNORMAL LOW (ref 12.0–15.0)
Immature Granulocytes: 2 %
Lymphocytes Relative: 20 %
Lymphs Abs: 2 10*3/uL (ref 0.7–4.0)
MCH: 30.7 pg (ref 26.0–34.0)
MCHC: 33.1 g/dL (ref 30.0–36.0)
MCV: 92.8 fL (ref 80.0–100.0)
Monocytes Absolute: 0.8 10*3/uL (ref 0.1–1.0)
Monocytes Relative: 8 %
Neutro Abs: 6.9 10*3/uL (ref 1.7–7.7)
Neutrophils Relative %: 67 %
Platelets: 219 10*3/uL (ref 150–400)
RBC: 3.74 MIL/uL — ABNORMAL LOW (ref 3.87–5.11)
RDW: 14.2 % (ref 11.5–15.5)
WBC: 10.1 10*3/uL (ref 4.0–10.5)
nRBC: 0 % (ref 0.0–0.2)

## 2018-10-29 LAB — RENAL FUNCTION PANEL
Albumin: 2.5 g/dL — ABNORMAL LOW (ref 3.5–5.0)
Anion gap: 9 (ref 5–15)
BUN: 6 mg/dL — ABNORMAL LOW (ref 8–23)
CO2: 20 mmol/L — ABNORMAL LOW (ref 22–32)
Calcium: 8.4 mg/dL — ABNORMAL LOW (ref 8.9–10.3)
Chloride: 109 mmol/L (ref 98–111)
Creatinine, Ser: 0.78 mg/dL (ref 0.44–1.00)
GFR calc Af Amer: >60 mL/min (ref >60)
GFR calc non Af Amer: >60 mL/min (ref >60)
Glucose, Bld: 198 mg/dL — ABNORMAL HIGH (ref 70–99)
Phosphorus: 2.6 mg/dL (ref 2.5–4.6)
Potassium: 3.7 mmol/L (ref 3.5–5.1)
Sodium: 138 mmol/L (ref 135–145)

## 2018-10-29 LAB — PROTIME-INR
INR: 2.73
Prothrombin Time: 28.5 seconds — ABNORMAL HIGH (ref 11.4–15.2)

## 2018-10-29 LAB — BRAIN NATRIURETIC PEPTIDE: B Natriuretic Peptide: 302.9 pg/mL — ABNORMAL HIGH (ref 0.0–100.0)

## 2018-10-29 LAB — GLUCOSE, CAPILLARY
GLUCOSE-CAPILLARY: 190 mg/dL — AB (ref 70–99)
Glucose-Capillary: 150 mg/dL — ABNORMAL HIGH (ref 70–99)
Glucose-Capillary: 196 mg/dL — ABNORMAL HIGH (ref 70–99)
Glucose-Capillary: 190 mg/dL — ABNORMAL HIGH (ref 70–99)

## 2018-10-29 LAB — MAGNESIUM: Magnesium: 1.4 mg/dL — ABNORMAL LOW (ref 1.7–2.4)

## 2018-10-29 MED ORDER — FUROSEMIDE 10 MG/ML IJ SOLN
40.0000 mg | Freq: Once | INTRAMUSCULAR | Status: AC
  Administered 2018-10-29: 40 mg via INTRAVENOUS
  Filled 2018-10-29: qty 4

## 2018-10-29 MED ORDER — WARFARIN SODIUM 2.5 MG PO TABS
2.5000 mg | ORAL_TABLET | Freq: Once | ORAL | Status: AC
  Administered 2018-10-29: 2.5 mg via ORAL
  Filled 2018-10-29: qty 1

## 2018-10-29 NOTE — Plan of Care (Signed)
  Problem: Education: Goal: Knowledge of General Education information will improve Description Including pain rating scale, medication(s)/side effects and non-pharmacologic comfort measures 10/29/2018 2030 by Drenda Freeze, RN Outcome: Progressing 10/29/2018 2029 by Drenda Freeze, RN Outcome: Progressing   Problem: Health Behavior/Discharge Planning: Goal: Ability to manage health-related needs will improve 10/29/2018 2030 by Drenda Freeze, RN Outcome: Progressing 10/29/2018 2029 by Drenda Freeze, RN Outcome: Progressing

## 2018-10-29 NOTE — Plan of Care (Signed)
  Problem: Education: Goal: Knowledge of General Education information will improve Description Including pain rating scale, medication(s)/side effects and non-pharmacologic comfort measures Outcome: Progressing   Problem: Health Behavior/Discharge Planning: Goal: Ability to manage health-related needs will improve Outcome: Progressing   

## 2018-10-29 NOTE — Progress Notes (Signed)
PROGRESS NOTE    [name] [name]  FOY:[id number] DOB: October 28, 1929 DOA: 10/25/2018 PCP: [name], MD   Brief Narrative: Patient is 85 year old female with past medical history of chronic A. fib on warfarin, carotid stenosis, COPD, GERD, hypertension, type 2 diabetes, C. difficile who presents to the emergency  department with complaints of fever, chills, dysuria and incontinence.  She is from home and lives with her husband.  She ambulates with the help of rolling walker.  When she presented she was febrile with temperature of 101.9. ,  Had leukocytosis.  UA was abnormal.  Urinary tract infection was suspected on presentation.  Currently blood cultures showing E. coli.  Started on ceftriaxone.  Currently hemodynamically stable.  10/28/2018: Patient seen.  No significant history from patient.  Patient remains drowsy/lethargic.  Urine and blood culture results noted.  Continue IV antibiotics for now.  10/29/2018: Patient seen alongside patient's husband.  Patient is more interactive today, however, still having intermittent episodes of mild drowsiness.  Will discontinue IV fluids.  Will give 1 dose of IV Lasix.  Lab work in a.m.  Will repeat chest x-ray.  Will also repeat urinalysis and urine culture.  Continue IV antibiotics for now.  Assessment & Plan:   Principal Problem:   Bacteremia Active Problems:   UTI (urinary tract infection)   DM II (diabetes mellitus, type II), controlled (HCC)   HTN (hypertension)   Atrial fibrillation    Anxiety   COPD (chronic obstructive pulmonary disease) (HCC)   DM neuropathy, type II diabetes mellitus (HCC)   SIRS (systemic inflammatory response syndrome) (HCC)  UTI/sepsis secondary to E. coli:  Presented with fever, tachycardia, lactic acidosis.  She was confused on presentation. UA was abnormal. Blood cultures showing E. coli. Likely source is urinary.  Currently on ceftriaxone 2 g daily.  Currently she is afebrile.  Hemodynamically stable.  We  will continue ceftriaxone until we get the final sensitivity. 10/28/2018: Urine and blood culture grew E. coli.  Continue IV ceftriaxone for now.  Culture result is noted. 10/29/2018: Repeat urinalysis and urine culture.  Altered mental status: Metabolic encephalopathy due to sepsis.  Mental status has improved this morning.  Currently she is alert and oriented. 10/28/2018: Patient remains drowsy/lethargic.  Continue to treat underlying sepsis. 10/29/2018: Improving slowly.  More interactive today.  Chronic A. fib with RVR: Heart rate better. She was given Cardizem IV push and was started on Cardizem drip on presentation.  She is on oral Cardizem and metoprolol at home which we will continue..  On warfarin for anticoagulation.  Continue monitoring INR daily.  Dysphagia: She has history of dysphagia.  She follows with GI as an outpatient.  Speech therapy already evaluated her and recommended dysphagia 3 diet.  COPD: Currently stable.  Continue inhalers and bronchodilators.  Type 2 diabetes mellitus: On insulin at home.  Lantus and sliding scale insulin will be continued.  Hypertension: Currently blood pressure stable.  We will continue to monitor.  Deconditioning/debility/recent foot fracture: Physical therapy will evaluate her.  DVT prophylaxis:Warfarin Code Status: Full code Family Communication: Discussed with husband on the phone today Disposition Plan: Pending PT evaluation.  Medically not ready for discharge   Consultants: None  Procedures: None  Antimicrobials:  Anti-infectives (From admission, onward)   Start     Dose/Rate Route Frequency Ordered Stop   10/27/18 0800  cefTRIAXone (ROCEPHIN) 1 g in sodium chloride 0.9 % 100 mL IVPB  Status:  Discontinued     1 g 200 mL/hr over 30  Minutes Intravenous Every 24 hours 10/26/18 0400 10/26/18 0937   10/27/18 0800  cefTRIAXone (ROCEPHIN) 2 g in sodium chloride 0.9 % 100 mL IVPB     2 g 200 mL/hr over 30 Minutes Intravenous Every 24  hours 10/26/18 0937     10/26/18 1400  cefTRIAXone (ROCEPHIN) 1 g in sodium chloride 0.9 % 100 mL IVPB  Status:  Discontinued     1 g 200 mL/hr over 30 Minutes Intravenous Every 24 hours 10/25/18 1556 10/25/18 1625   10/26/18 1400  cefTRIAXone (ROCEPHIN) 1 g in sodium chloride 0.9 % 100 mL IVPB  Status:  Discontinued     1 g 200 mL/hr over 30 Minutes Intravenous Every 24 hours 10/25/18 1625 10/26/18 0400   10/26/18 0430  cefTRIAXone (ROCEPHIN) 2 g in sodium chloride 0.9 % 100 mL IVPB     2 g 200 mL/hr over 30 Minutes Intravenous  Once 10/26/18 0400 10/26/18 0757   10/25/18 1630  cefTRIAXone (ROCEPHIN) 1 g in sodium chloride 0.9 % 100 mL IVPB  Status:  Discontinued     1 g 200 mL/hr over 30 Minutes Intravenous  Once 10/25/18 1625 10/25/18 1643   10/25/18 1500  cefTRIAXone (ROCEPHIN) 1 g in sodium chloride 0.9 % 100 mL IVPB     1 g 200 mL/hr over 30 Minutes Intravenous  Once 10/25/18 1457 10/25/18 1604      Subjective: Patient seen and examined at bedside this morning.  Hemodynamically stable.  Afebrile.  Still found to be weak.  Lies on the bed most of the time.  Alert and oriented.  Asking when she can go home. Objective: Vitals:   10/29/18 0443 10/29/18 0451 10/29/18 0800 10/29/18 0838  BP: (!) 147/84  134/84   Pulse: 89     Resp:  (!) 30    Temp: 99.2 F (37.3 C)  98.2 F (36.8 C)   TempSrc: Oral  Oral   SpO2: 96% 95%  97%  Weight:  67.2 kg    Height:        Intake/Output Summary (Last 24 hours) at 10/29/2018 0955 Last data filed at 10/29/2018 0600 Gross per 24 hour  Intake 2477.9 ml  Output 2250 ml  Net 227.9 ml   Filed Weights   10/25/18 1219 10/25/18 2015 10/29/18 0451  Weight: 68 kg 63.3 kg 67.2 kg    Examination:  General exam: Elderly.  More interactive today.     HEENT: Pallor.  No jaundice.  Respiratory system: Clear to auscultation.   Cardiovascular system: A. fib , no JVD, murmurs, rubs, gallops or clicks. Gastrointestinal system: Abdomen is  nondistended, soft and nontender. No organomegaly or masses felt. Normal bowel sounds heard. Central nervous system: Drowsy/lethargic.   Extremities: Swollen right upper extremity from infiltrated IV site.    Data Reviewed: I have personally reviewed following labs and imaging studies  CBC: Recent Labs  Lab 10/25/18 1233 10/26/18 0350 10/27/18 0356  WBC 20.6* 17.3* 10.9*  NEUTROABS 19.1*  --  7.7  HGB 14.4 11.2* 11.2*  HCT 44.8 34.8* 34.0*  MCV 93.9 92.3 94.4  PLT 245 170 580   Basic Metabolic Panel: Recent Labs  Lab 10/25/18 1233 10/26/18 0350 10/27/18 0356  NA 137 139 138  K 3.7 3.4* 4.1  CL 106 113* 114*  CO2 17* 17* 17*  GLUCOSE 207* 109* 154*  BUN 13 14 12   CREATININE 1.14* 0.99 0.98  CALCIUM 9.4 8.0* 8.1*   GFR: Estimated Creatinine Clearance: 35.3 mL/min (by C-G formula  based on SCr of 0.98 mg/dL). Liver Function Tests: Recent Labs  Lab 10/25/18 1233  AST 33  ALT 18  ALKPHOS 89  BILITOT 1.2  PROT 7.2  ALBUMIN 3.5   No results for input(s): LIPASE, AMYLASE in the last 168 hours. No results for input(s): AMMONIA in the last 168 hours. Coagulation Profile: Recent Labs  Lab 10/25/18 1233 10/26/18 0350 10/27/18 0356 10/28/18 0407 10/29/18 0630  INR 1.93 1.99 2.49 3.05 2.73   Cardiac Enzymes: No results for input(s): CKTOTAL, CKMB, CKMBINDEX, TROPONINI in the last 168 hours. BNP (last 3 results) No results for input(s): PROBNP in the last 8760 hours. HbA1C: No results for input(s): HGBA1C in the last 72 hours. CBG: Recent Labs  Lab 10/28/18 0622 10/28/18 1142 10/28/18 1607 10/28/18 2052 10/29/18 0611  GLUCAP 108* 180* 172* 144* 150*   Lipid Profile: No results for input(s): CHOL, HDL, LDLCALC, TRIG, CHOLHDL, LDLDIRECT in the last 72 hours. Thyroid Function Tests: No results for input(s): TSH, T4TOTAL, FREET4, T3FREE, THYROIDAB in the last 72 hours. Anemia Panel: No results for input(s): VITAMINB12, FOLATE, FERRITIN, TIBC, IRON,  RETICCTPCT in the last 72 hours. Sepsis Labs: Recent Labs  Lab 10/25/18 1330 10/25/18 1517  LATICACIDVEN 2.0* 2.2*    Recent Results (from the past 240 hour(s))  Culture, blood (routine x 2)     Status: Abnormal   Collection Time: 10/25/18 12:50 PM  Result Value Ref Range Status   Specimen Description BLOOD BLOOD RIGHT HAND  Final   Special Requests   Final    BOTTLES DRAWN AEROBIC AND ANAEROBIC Blood Culture adequate volume   Culture  Setup Time   Final    GRAM NEGATIVE RODS AEROBIC BOTTLE ONLY CRITICAL VALUE NOTED.  VALUE IS CONSISTENT WITH PREVIOUSLY REPORTED AND CALLED VALUE.    Culture ESCHERICHIA COLI (A)  Final   Report Status 10/28/2018 FINAL  Final   Organism ID, Bacteria ESCHERICHIA COLI  Final      Susceptibility   Escherichia coli - MIC*    AMPICILLIN >=32 RESISTANT Resistant     CEFAZOLIN 16 SENSITIVE Sensitive     CEFEPIME <=1 SENSITIVE Sensitive     CEFTAZIDIME <=1 SENSITIVE Sensitive     CEFTRIAXONE <=1 SENSITIVE Sensitive     CIPROFLOXACIN <=0.25 SENSITIVE Sensitive     GENTAMICIN <=1 SENSITIVE Sensitive     IMIPENEM <=0.25 SENSITIVE Sensitive     TRIMETH/SULFA <=20 SENSITIVE Sensitive     AMPICILLIN/SULBACTAM >=32 RESISTANT Resistant     PIP/TAZO <=4 SENSITIVE Sensitive     Extended ESBL NEGATIVE Sensitive     * ESCHERICHIA COLI  Culture, blood (routine x 2)     Status: Abnormal   Collection Time: 10/25/18 12:55 PM  Result Value Ref Range Status   Specimen Description BLOOD BLOOD LEFT HAND  Final   Special Requests   Final    BOTTLES DRAWN AEROBIC AND ANAEROBIC Blood Culture adequate volume   Culture  Setup Time   Final    GRAM NEGATIVE RODS ANAEROBIC BOTTLE ONLY CRITICAL RESULT CALLED TO, READ BACK BY AND VERIFIED WITH: Denton Brick PHARMD [id number] 10/26/2018 A BROWNING    Culture (A)  Final    ESCHERICHIA COLI SUSCEPTIBILITIES PERFORMED ON PREVIOUS CULTURE WITHIN THE LAST 5 DAYS. Performed at [facility], [place] [address], [place], [place]  [address]    Report Status 10/28/2018 FINAL  Final  Blood Culture ID Panel (Reflexed)     Status: Abnormal   Collection Time: 10/25/18 12:55 PM  Result Value Ref Range Status   Enterococcus species NOT DETECTED NOT DETECTED Final   Listeria monocytogenes NOT DETECTED NOT DETECTED Final   Staphylococcus species NOT DETECTED NOT DETECTED Final   Staphylococcus aureus (BCID) NOT DETECTED NOT DETECTED Final   Streptococcus species NOT DETECTED NOT DETECTED Final   Streptococcus agalactiae NOT DETECTED NOT DETECTED Final   Streptococcus pneumoniae NOT DETECTED NOT DETECTED Final   Streptococcus pyogenes NOT DETECTED NOT DETECTED Final   Acinetobacter baumannii NOT DETECTED NOT DETECTED Final   Enterobacteriaceae species DETECTED (A) NOT DETECTED Final    Comment: Enterobacteriaceae represent a large family of gram-negative bacteria, not a single organism. CRITICAL RESULT CALLED TO, READ BACK BY AND VERIFIED WITH: Denton Brick PHARMD [id number] 10/26/2018 A BROWNING    Enterobacter cloacae complex NOT DETECTED NOT DETECTED Final   Escherichia coli DETECTED (A) NOT DETECTED Final    Comment: CRITICAL RESULT CALLED TO, READ BACK BY AND VERIFIED WITH: Denton Brick PHARMD [id number] 10/26/2018 A BROWNING    Klebsiella oxytoca NOT DETECTED NOT DETECTED Final   Klebsiella pneumoniae NOT DETECTED NOT DETECTED Final   Proteus species NOT DETECTED NOT DETECTED Final   Serratia marcescens NOT DETECTED NOT DETECTED Final   Carbapenem resistance NOT DETECTED NOT DETECTED Final   Haemophilus influenzae NOT DETECTED NOT DETECTED Final   Neisseria meningitidis NOT DETECTED NOT DETECTED Final   Pseudomonas aeruginosa NOT DETECTED NOT DETECTED Final   Candida albicans NOT DETECTED NOT DETECTED Final   Candida glabrata NOT DETECTED NOT DETECTED Final   Candida krusei NOT DETECTED NOT DETECTED Final   Candida parapsilosis NOT DETECTED NOT DETECTED Final   Candida tropicalis NOT DETECTED NOT DETECTED Final    Comment: Performed  at [facility], [place] [address], [place], [place] [address]  Culture, Urine     Status: Abnormal   Collection Time: 10/25/18  3:57 PM  Result Value Ref Range Status   Specimen Description URINE, RANDOM  Final   Special Requests   Final    NONE Performed at [facility], [place] [address], [place], [place] [address]    Culture MULTIPLE SPECIES PRESENT, SUGGEST RECOLLECTION (A)  Final   Report Status 10/27/2018 FINAL  Final         Radiology Studies: No results found.      Scheduled Meds: . diltiazem  120 mg Oral Daily  . gabapentin  300 mg Oral Daily  . insulin aspart  0-5 Units Subcutaneous QHS  . insulin aspart  0-9 Units Subcutaneous TID WC  . latanoprost  1 drop Both Eyes QHS  . metoprolol tartrate  12.5 mg Oral BID  . mirtazapine  7.5 mg Oral QHS  . mometasone-formoterol  2 puff Inhalation BID  . multivitamin with minerals  1 tablet Oral Daily  . oxybutynin  5 mg Oral QHS  . pantoprazole  40 mg Oral BID  . Warfarin - Pharmacist Dosing Inpatient   Does not apply q1800   Continuous Infusions: . sodium chloride 75 mL/hr at 10/29/18 0600  . cefTRIAXone (ROCEPHIN)  IV 2 g (10/29/18 0803)     LOS: 3 days   Time spent: [place], MD [facility] Pager [phone]  If 7PM-7AM, please contact night-coverage [email or web address] Password [facility] 10/29/2018, 9:55 AM

## 2018-10-29 NOTE — Clinical Social Work Note (Signed)
Clinical Social Work Assessment  Patient Details  Name: [name] [name] MRN: [id number] Date of Birth: 06/14/1930  Date of referral:  10/29/18               Reason for consult:  Facility Placement                Permission sought to share information with:  Facility Sport and exercise psychologist Permission granted to share information::  Yes, Verbal Permission Granted  Name::     [name] [name]  Agency::  [facility]  Relationship::  spouse   Contact Information:  [phone] or [phone]  Housing/Transportation Living arrangements for the past 2 months:  [place] of Information:  Patient, Spouse Patient Interpreter Needed:  None Criminal Activity/Legal Involvement Pertinent to Current Situation/Hospitalization:  No - Comment as needed Significant Relationships:  Spouse Lives with:  Spouse Do you feel safe going back to the place where you live?  No Need for family participation in patient care:  Yes (Comment)  Care giving concerns:  CSW received consult for discharge needs. CSW spoke with patient and her spouse regarding PT recommendation of SNF placement at time of discharge. Patient states that originally they refused SNF . However after some consideration they agree it would be best to go to rehab and get stronger before returning home. Patient's spouse expressed patient needs assistance with walking and he wants he to get stronger so he can better care for her.    Social Worker assessment / plan:  CSW spoke with patient and her spouse concerning possibility of rehab at [facility] before returning home.   Employment status:  Other (Comment) Insurance information:  Medicare PT Recommendations:  [place] / Referral to community resources:  [place]  Patient/Family's Response to care:  Patient recognizes need for rehab before returning home and is agreeable to a SNF placement. Patient reported preference for [facility].   Patient/Family's Understanding of and Emotional Response to Diagnosis, Current Treatment, and Prognosis:  Patient is realistic regarding therapy needs and expressed being hopeful for SNF placement. Patient expressed understanding of CSW role and discharge process as well as medical condition. No questions/concerns about plan or treatment at this time.   Emotional Assessment Appearance:  Appears stated age Attitude/Demeanor/Rapport:  Engaged Affect (typically observed):  Accepting, Pleasant, Appropriate Orientation:  Oriented to Self, Oriented to Place, Oriented to  Time, Oriented to Situation Alcohol / Substance use:  Not Applicable Psych involvement (Current and /or in the community):  No (Comment)  Discharge Needs  Concerns to be addressed:  Basic Needs Readmission within the last 30 days:  No Current discharge risk:  Dependent with Mobility Barriers to Discharge:  Continued Medical Work up   [employer], [place] 10/29/2018, 10:16 PM

## 2018-10-29 NOTE — Progress Notes (Signed)
[place] for warfarin Indication: atrial fibrillation  Patient Measurements: Height: 5' (152.4 cm) Weight: 148 lb 2.4 oz (67.2 kg) IBW/kg (Calculated) : 45.5   Vital Signs: Temp: 98.2 F (36.8 C) (01/25 0800) Temp Source: Oral (01/25 0800) BP: 134/84 (01/25 0800) Pulse Rate: 89 (01/25 0443)  Labs: Recent Labs    10/27/18 0356 10/28/18 0407 10/29/18 0630 10/29/18 1001  HGB 11.2*  --   --  11.5*  HCT 34.0*  --   --  34.7*  PLT 167  --   --  219  LABPROT 26.6* 31.1* 28.5*  --   INR 2.49 3.05 2.73  --   CREATININE 0.98  --   --  0.78     Medical History: Past Medical History:  Diagnosis Date  . Atrial fibrillation (HCC)    RVR hx.  Chronic Coumadin  . Bacteremia 10/2018  . C. difficile colitis 09/21/2013, 11/2013  . Carotid stenosis    a. Carotid US (10/2013):  bilat 1-39%; f/u 1 year  . Chronic lower back pain   . COPD (chronic obstructive pulmonary disease) ([place])   . Depression   . Diverticulosis of colon without hemorrhage   . GERD (gastroesophageal reflux disease)    with HH  . Glaucoma   . HCAP (healthcare-associated pneumonia) 11/21/2011  . High cholesterol   . Hypertension   . Metabolic encephalopathy 0/06/7352  . Mitral regurgitation 03/11/2012  . Pancreatitis    ~2008, 11/2011 post ERCP  . Pneumonia 12/2011   "first time I know about"  . Sepsis ([place]) 11/27/2013  . SIRS (systemic inflammatory response syndrome) ([place]) 03/13/2012   a/w post ERCP  pancreatitis.   . Syncope   . Type II diabetes mellitus [facility])     Assessment:  85 yo female with recent fall + resultant ankle fracture noted with ecoli bacteremia. She has AFib and is on warfarin prior to admission. -INR 1.99>>2.49>>3.05>>2.73 CBC stable  home warfarin regimen: 2.5mg /d except take 5mg  MWF  Goal of Therapy:  INR 2-3 Monitor platelets by anticoagulation protocol: Yes    Plan:  -Restart warfarin at 2.5 mg po x 1 tonight -daily INR  Alanda Slim,  PharmD, [facility] Clinical Pharmacist Please see AMION for all Pharmacists' Contact Phone Numbers 10/29/2018, 10:57 AM

## 2018-10-30 LAB — CBC WITH DIFFERENTIAL/PLATELET
Abs Immature Granulocytes: 0.25 10*3/uL — ABNORMAL HIGH (ref 0.00–0.07)
Basophils Absolute: 0.1 10*3/uL (ref 0.0–0.1)
Basophils Relative: 1 %
Eosinophils Absolute: 0.2 10*3/uL (ref 0.0–0.5)
Eosinophils Relative: 2 %
HCT: 33.8 % — ABNORMAL LOW (ref 36.0–46.0)
Hemoglobin: 11 g/dL — ABNORMAL LOW (ref 12.0–15.0)
Immature Granulocytes: 2 %
Lymphocytes Relative: 20 %
Lymphs Abs: 2.1 10*3/uL (ref 0.7–4.0)
MCH: 30.1 pg (ref 26.0–34.0)
MCHC: 32.5 g/dL (ref 30.0–36.0)
MCV: 92.3 fL (ref 80.0–100.0)
Monocytes Absolute: 0.7 10*3/uL (ref 0.1–1.0)
Monocytes Relative: 7 %
Neutro Abs: 7.2 10*3/uL (ref 1.7–7.7)
Neutrophils Relative %: 68 %
Platelets: 235 10*3/uL (ref 150–400)
RBC: 3.66 MIL/uL — ABNORMAL LOW (ref 3.87–5.11)
RDW: 14 % (ref 11.5–15.5)
WBC: 10.5 10*3/uL (ref 4.0–10.5)
nRBC: 0 % (ref 0.0–0.2)

## 2018-10-30 LAB — GLUCOSE, CAPILLARY
Glucose-Capillary: 153 mg/dL — ABNORMAL HIGH (ref 70–99)
Glucose-Capillary: 159 mg/dL — ABNORMAL HIGH (ref 70–99)
Glucose-Capillary: 229 mg/dL — ABNORMAL HIGH (ref 70–99)
Glucose-Capillary: 204 mg/dL — ABNORMAL HIGH (ref 70–99)

## 2018-10-30 LAB — RENAL FUNCTION PANEL
Albumin: 2.6 g/dL — ABNORMAL LOW (ref 3.5–5.0)
Anion gap: 12 (ref 5–15)
BUN: 5 mg/dL — ABNORMAL LOW (ref 8–23)
CO2: 24 mmol/L (ref 22–32)
Calcium: 8.6 mg/dL — ABNORMAL LOW (ref 8.9–10.3)
Chloride: 103 mmol/L (ref 98–111)
Creatinine, Ser: 0.72 mg/dL (ref 0.44–1.00)
GFR calc Af Amer: >60 mL/min (ref >60)
GFR calc non Af Amer: >60 mL/min (ref >60)
Glucose, Bld: 140 mg/dL — ABNORMAL HIGH (ref 70–99)
Phosphorus: 2.6 mg/dL (ref 2.5–4.6)
Potassium: 2.9 mmol/L — ABNORMAL LOW (ref 3.5–5.1)
Sodium: 139 mmol/L (ref 135–145)

## 2018-10-30 LAB — PROTIME-INR
INR: 2.6
Prothrombin Time: 27.5 seconds — ABNORMAL HIGH (ref 11.4–15.2)

## 2018-10-30 LAB — MAGNESIUM: Magnesium: 1.4 mg/dL — ABNORMAL LOW (ref 1.7–2.4)

## 2018-10-30 MED ORDER — MAGNESIUM SULFATE 50 % IJ SOLN
3.0000 g | Freq: Once | INTRAVENOUS | Status: AC
  Administered 2018-10-30: 3 g via INTRAVENOUS
  Filled 2018-10-30: qty 6

## 2018-10-30 MED ORDER — FUROSEMIDE 10 MG/ML IJ SOLN
40.0000 mg | Freq: Once | INTRAMUSCULAR | Status: AC
  Administered 2018-10-30: 40 mg via INTRAVENOUS
  Filled 2018-10-30: qty 4

## 2018-10-30 MED ORDER — WARFARIN SODIUM 2.5 MG PO TABS
2.5000 mg | ORAL_TABLET | Freq: Once | ORAL | Status: AC
  Administered 2018-10-30: 2.5 mg via ORAL
  Filled 2018-10-30: qty 1

## 2018-10-30 MED ORDER — POTASSIUM CHLORIDE CRYS ER 20 MEQ PO TBCR
40.0000 meq | EXTENDED_RELEASE_TABLET | ORAL | Status: AC
  Administered 2018-10-30 (×3): 40 meq via ORAL
  Filled 2018-10-30 (×3): qty 2

## 2018-10-30 NOTE — Progress Notes (Signed)
ANTICOAGULATION CONSULT NOTE  Pharmacy Consult for warfarin Indication: atrial fibrillation  Patient Measurements: Height: 5' (152.4 cm) Weight: 148 lb 2.4 oz (67.2 kg) IBW/kg (Calculated) : 45.5   Vital Signs: Temp: 98.2 F (36.8 C) (01/26 0820) Temp Source: Oral (01/26 0820) BP: 143/65 (01/26 0820) Pulse Rate: 107 (01/26 0820)  Labs: Recent Labs    10/28/18 0407 10/29/18 0630 10/29/18 1001 10/30/18 0500  HGB  --   --  11.5* 11.0*  HCT  --   --  34.7* 33.8*  PLT  --   --  219 235  LABPROT 31.1* 28.5*  --  27.5*  INR 3.05 2.73  --  2.60  CREATININE  --   --  0.78 0.72     Medical History: Past Medical History:  Diagnosis Date  . Atrial fibrillation (HCC)    RVR hx.  Chronic Coumadin  . Bacteremia 10/2018  . C. difficile colitis 09/21/2013, 11/2013  . Carotid stenosis    a. Carotid US (10/2013):  bilat 1-39%; f/u 1 year  . Chronic lower back pain   . COPD (chronic obstructive pulmonary disease) ([place])   . Depression   . Diverticulosis of colon without hemorrhage   . GERD (gastroesophageal reflux disease)    with HH  . Glaucoma   . HCAP (healthcare-associated pneumonia) 11/21/2011  . High cholesterol   . Hypertension   . Metabolic encephalopathy 06/09/1739  . Mitral regurgitation 03/11/2012  . Pancreatitis    ~2008, 11/2011 post ERCP  . Pneumonia 12/2011   "first time I know about"  . Sepsis ([place]) 11/27/2013  . SIRS (systemic inflammatory response syndrome) ([place]) 03/13/2012   a/w post ERCP  pancreatitis.   . Syncope   . Type II diabetes mellitus [facility])     Assessment:  85 yo female with recent fall + resultant ankle fracture noted with ecoli bacteremia. She has AFib and is on warfarin prior to admission. -INR 1.99>>2.49>>3.05>>2.73>>2.6 CBC stable  home warfarin regimen: 2.5mg /d except take 5mg  MWF  Goal of Therapy:  INR 2-3 Monitor platelets by anticoagulation protocol: Yes    Plan:  -Warfarin at 2.5 mg po x 1 tonight -daily INR  Alanda Slim,  PharmD, [facility] Clinical Pharmacist Please see AMION for all Pharmacists' Contact Phone Numbers 10/30/2018, 11:15 AM

## 2018-10-30 NOTE — Progress Notes (Signed)
PROGRESS NOTE    [name] [name]  YWV:[id number] DOB: January 02, 1930 DOA: 10/25/2018 PCP: [name], MD   Brief Narrative: Patient is 85 year old female with past medical history of chronic A. fib on warfarin, carotid stenosis, COPD, GERD, hypertension, type 2 diabetes, C. difficile who presents to the emergency  department with complaints of fever, chills, dysuria and incontinence.  She is from home and lives with her husband.  She ambulates with the help of rolling walker.  When she presented she was febrile with temperature of 101.9. ,  Had leukocytosis.  UA was abnormal.  Urinary tract infection was suspected on presentation.  Currently blood cultures showing E. coli.  Started on ceftriaxone.  Currently hemodynamically stable.  10/28/2018: Patient seen.  No significant history from patient.  Patient remains drowsy/lethargic.  Urine and blood culture results noted.  Continue IV antibiotics for now.  10/29/2018: Patient seen alongside patient's husband.  Patient is more interactive today, however, still having intermittent episodes of mild drowsiness.  Will discontinue IV fluids.  Will give 1 dose of IV Lasix.  Lab work in a.m.  Will repeat chest x-ray.  Will also repeat urinalysis and urine culture.  Continue IV antibiotics for now.  10/30/2018: Patient seen alongside patient's nurse.  Patient looks better today.  Will give 1 more dose of IV Lasix 40 mg x 1 dose.  Will repeat blood cultures.  Consult OT.  Physical therapy input is highly appreciated.  Likely discharge in the next 1 to 2 days.  Assessment & Plan:   Principal Problem:   Bacteremia Active Problems:   UTI (urinary tract infection)   DM II (diabetes mellitus, type II), controlled (HCC)   HTN (hypertension)   Atrial fibrillation    Anxiety   COPD (chronic obstructive pulmonary disease) (HCC)   DM neuropathy, type II diabetes mellitus (HCC)   SIRS (systemic inflammatory response syndrome) (HCC)  UTI/sepsis secondary to E.  coli:  Presented with fever, tachycardia, lactic acidosis.  She was confused on presentation. UA was abnormal. Blood cultures showing E. coli. Likely source is urinary.  Currently on ceftriaxone 2 g daily.  Currently she is afebrile.  Hemodynamically stable.  We will continue ceftriaxone until we get the final sensitivity. 10/28/2018: Urine and blood culture grew E. coli.  Continue IV ceftriaxone for now.  Culture result is noted. 10/29/2018: Repeat urinalysis and urine culture. 09/30/2019: Follow repeat urine culture.  Reculture patient's blood.  Continue IV antibiotics for now.  Altered mental status: Metabolic encephalopathy due to sepsis.  Mental status has improved this morning.  Currently she is alert and oriented. 10/28/2018: Patient remains drowsy/lethargic.  Continue to treat underlying sepsis. 10/29/2018: Improving slowly.  More interactive today. 10/30/2018: Improved significantly.  Patient is still on more communicative today.  Chronic A. fib with RVR: Heart rate better. She was given Cardizem IV push and was started on Cardizem drip on presentation.  She is on oral Cardizem and metoprolol at home which we will continue..  On warfarin for anticoagulation.  Continue monitoring INR daily.  Dysphagia: She has history of dysphagia.  She follows with GI as an outpatient.  Speech therapy already evaluated her and recommended dysphagia 3 diet.  COPD: Currently stable.  Continue inhalers and bronchodilators.  Type 2 diabetes mellitus: On insulin at home.  Lantus and sliding scale insulin will be continued.  Hypertension: Currently blood pressure stable.  We will continue to monitor.  Deconditioning/debility/recent foot fracture: Physical therapy will evaluate her.  DVT prophylaxis:Warfarin Code Status: Full code  Family Communication: Discussed with husband on the phone today Disposition Plan: Pending PT evaluation.  Medically not ready for discharge   Consultants: None  Procedures:  None  Antimicrobials:  Anti-infectives (From admission, onward)   Start     Dose/Rate Route Frequency Ordered Stop   10/27/18 0800  cefTRIAXone (ROCEPHIN) 1 g in sodium chloride 0.9 % 100 mL IVPB  Status:  Discontinued     1 g 200 mL/hr over 30 Minutes Intravenous Every 24 hours 10/26/18 0400 10/26/18 0937   10/27/18 0800  cefTRIAXone (ROCEPHIN) 2 g in sodium chloride 0.9 % 100 mL IVPB     2 g 200 mL/hr over 30 Minutes Intravenous Every 24 hours 10/26/18 0937     10/26/18 1400  cefTRIAXone (ROCEPHIN) 1 g in sodium chloride 0.9 % 100 mL IVPB  Status:  Discontinued     1 g 200 mL/hr over 30 Minutes Intravenous Every 24 hours 10/25/18 1556 10/25/18 1625   10/26/18 1400  cefTRIAXone (ROCEPHIN) 1 g in sodium chloride 0.9 % 100 mL IVPB  Status:  Discontinued     1 g 200 mL/hr over 30 Minutes Intravenous Every 24 hours 10/25/18 1625 10/26/18 0400   10/26/18 0430  cefTRIAXone (ROCEPHIN) 2 g in sodium chloride 0.9 % 100 mL IVPB     2 g 200 mL/hr over 30 Minutes Intravenous  Once 10/26/18 0400 10/26/18 0757   10/25/18 1630  cefTRIAXone (ROCEPHIN) 1 g in sodium chloride 0.9 % 100 mL IVPB  Status:  Discontinued     1 g 200 mL/hr over 30 Minutes Intravenous  Once 10/25/18 1625 10/25/18 1643   10/25/18 1500  cefTRIAXone (ROCEPHIN) 1 g in sodium chloride 0.9 % 100 mL IVPB     1 g 200 mL/hr over 30 Minutes Intravenous  Once 10/25/18 1457 10/25/18 1604      Subjective: Patient seen and examined at bedside this morning.  Hemodynamically stable.  Afebrile.  Still found to be weak.  Lies on the bed most of the time.  Alert and oriented.  Asking when she can go home. Objective: Vitals:   10/29/18 2357 10/30/18 0418 10/30/18 0808 10/30/18 0820  BP: 138/69 (!) 159/88  (!) 143/65  Pulse: 88 (!) 112 96 (!) 107  Resp: 20 (!) 25 20 16   Temp: 98.1 F (36.7 C) 98.5 F (36.9 C)  98.2 F (36.8 C)  TempSrc: Oral Oral  Oral  SpO2: 94% 95% 95% 97%  Weight:      Height:        Intake/Output Summary (Last  24 hours) at 10/30/2018 0937 Last data filed at 10/30/2018 0800 Gross per 24 hour  Intake 340 ml  Output 3000 ml  Net -2660 ml   Filed Weights   10/25/18 1219 10/25/18 2015 10/29/18 0451  Weight: 68 kg 63.3 kg 67.2 kg    Examination:  General exam: Looks a lot better today.  A lot more interactive today.     HEENT: Pallor.  No jaundice.  Respiratory system: Clear to auscultation.   Cardiovascular system: A. fib , no JVD, murmurs, rubs, gallops or clicks. Gastrointestinal system: Abdomen is nondistended, soft and nontender. No organomegaly or masses felt. Normal bowel sounds heard. Central nervous system: Awake and alert.  Patient moves all limbs. Extremities: Swollen right upper extremity from infiltrated IV site.    Data Reviewed: I have personally reviewed following labs and imaging studies  CBC: Recent Labs  Lab 10/25/18 1233 10/26/18 0350 10/27/18 0356 10/29/18 1001 10/30/18 0500  WBC 20.6* 17.3* 10.9* 10.1 10.5  NEUTROABS 19.1*  --  7.7 6.9 7.2  HGB 14.4 11.2* 11.2* 11.5* 11.0*  HCT 44.8 34.8* 34.0* 34.7* 33.8*  MCV 93.9 92.3 94.4 92.8 92.3  PLT 245 170 167 219 716   Basic Metabolic Panel: Recent Labs  Lab 10/25/18 1233 10/26/18 0350 10/27/18 0356 10/29/18 1001 10/30/18 0500  NA 137 139 138 138 139  K 3.7 3.4* 4.1 3.7 2.9*  CL 106 113* 114* 109 103  CO2 17* 17* 17* 20* 24  GLUCOSE 207* 109* 154* 198* 140*  BUN 13 14 12  6* 5*  CREATININE 1.14* 0.99 0.98 0.78 0.72  CALCIUM 9.4 8.0* 8.1* 8.4* 8.6*  MG  --   --   --  1.4* 1.4*  PHOS  --   --   --  2.6 2.6   GFR: Estimated Creatinine Clearance: 43.2 mL/min (by C-G formula based on SCr of 0.72 mg/dL). Liver Function Tests: Recent Labs  Lab 10/25/18 1233 10/29/18 1001 10/30/18 0500  AST 33  --   --   ALT 18  --   --   ALKPHOS 89  --   --   BILITOT 1.2  --   --   PROT 7.2  --   --   ALBUMIN 3.5 2.5* 2.6*   No results for input(s): LIPASE, AMYLASE in the last 168 hours. No results for input(s):  AMMONIA in the last 168 hours. Coagulation Profile: Recent Labs  Lab 10/26/18 0350 10/27/18 0356 10/28/18 0407 10/29/18 0630 10/30/18 0500  INR 1.99 2.49 3.05 2.73 2.60   Cardiac Enzymes: No results for input(s): CKTOTAL, CKMB, CKMBINDEX, TROPONINI in the last 168 hours. BNP (last 3 results) No results for input(s): PROBNP in the last 8760 hours. HbA1C: No results for input(s): HGBA1C in the last 72 hours. CBG: Recent Labs  Lab 10/29/18 0611 10/29/18 1109 10/29/18 1559 10/29/18 2146 10/30/18 0630  GLUCAP 150* 196* 190* 190* 153*   Lipid Profile: No results for input(s): CHOL, HDL, LDLCALC, TRIG, CHOLHDL, LDLDIRECT in the last 72 hours. Thyroid Function Tests: No results for input(s): TSH, T4TOTAL, FREET4, T3FREE, THYROIDAB in the last 72 hours. Anemia Panel: No results for input(s): VITAMINB12, FOLATE, FERRITIN, TIBC, IRON, RETICCTPCT in the last 72 hours. Sepsis Labs: Recent Labs  Lab 10/25/18 1330 10/25/18 1517  LATICACIDVEN 2.0* 2.2*    Recent Results (from the past 240 hour(s))  Culture, blood (routine x 2)     Status: Abnormal   Collection Time: 10/25/18 12:50 PM  Result Value Ref Range Status   Specimen Description BLOOD BLOOD RIGHT HAND  Final   Special Requests   Final    BOTTLES DRAWN AEROBIC AND ANAEROBIC Blood Culture adequate volume   Culture  Setup Time   Final    GRAM NEGATIVE RODS AEROBIC BOTTLE ONLY CRITICAL VALUE NOTED.  VALUE IS CONSISTENT WITH PREVIOUSLY REPORTED AND CALLED VALUE.    Culture ESCHERICHIA COLI (A)  Final   Report Status 10/28/2018 FINAL  Final   Organism ID, Bacteria ESCHERICHIA COLI  Final      Susceptibility   Escherichia coli - MIC*    AMPICILLIN >=32 RESISTANT Resistant     CEFAZOLIN 16 SENSITIVE Sensitive     CEFEPIME <=1 SENSITIVE Sensitive     CEFTAZIDIME <=1 SENSITIVE Sensitive     CEFTRIAXONE <=1 SENSITIVE Sensitive     CIPROFLOXACIN <=0.25 SENSITIVE Sensitive     GENTAMICIN <=1 SENSITIVE Sensitive      IMIPENEM <=0.25 SENSITIVE Sensitive  TRIMETH/SULFA <=20 SENSITIVE Sensitive     AMPICILLIN/SULBACTAM >=32 RESISTANT Resistant     PIP/TAZO <=4 SENSITIVE Sensitive     Extended ESBL NEGATIVE Sensitive     * ESCHERICHIA COLI  Culture, blood (routine x 2)     Status: Abnormal   Collection Time: 10/25/18 12:55 PM  Result Value Ref Range Status   Specimen Description BLOOD BLOOD LEFT HAND  Final   Special Requests   Final    BOTTLES DRAWN AEROBIC AND ANAEROBIC Blood Culture adequate volume   Culture  Setup Time   Final    GRAM NEGATIVE RODS ANAEROBIC BOTTLE ONLY CRITICAL RESULT CALLED TO, READ BACK BY AND VERIFIED WITH: Denton Brick PHARMD [id number] 10/26/2018 A BROWNING    Culture (A)  Final    ESCHERICHIA COLI SUSCEPTIBILITIES PERFORMED ON PREVIOUS CULTURE WITHIN THE LAST 5 DAYS. Performed at [facility], [place] [address], [place], [place] [address]    Report Status 10/28/2018 FINAL  Final  Blood Culture ID Panel (Reflexed)     Status: Abnormal   Collection Time: 10/25/18 12:55 PM  Result Value Ref Range Status   Enterococcus species NOT DETECTED NOT DETECTED Final   Listeria monocytogenes NOT DETECTED NOT DETECTED Final   Staphylococcus species NOT DETECTED NOT DETECTED Final   Staphylococcus aureus (BCID) NOT DETECTED NOT DETECTED Final   Streptococcus species NOT DETECTED NOT DETECTED Final   Streptococcus agalactiae NOT DETECTED NOT DETECTED Final   Streptococcus pneumoniae NOT DETECTED NOT DETECTED Final   Streptococcus pyogenes NOT DETECTED NOT DETECTED Final   Acinetobacter baumannii NOT DETECTED NOT DETECTED Final   Enterobacteriaceae species DETECTED (A) NOT DETECTED Final    Comment: Enterobacteriaceae represent a large family of gram-negative bacteria, not a single organism. CRITICAL RESULT CALLED TO, READ BACK BY AND VERIFIED WITH: Denton Brick PHARMD [id number] 10/26/2018 A BROWNING    Enterobacter cloacae complex NOT DETECTED NOT DETECTED Final   Escherichia coli DETECTED (A)  NOT DETECTED Final    Comment: CRITICAL RESULT CALLED TO, READ BACK BY AND VERIFIED WITH: Denton Brick PHARMD [id number] 10/26/2018 A BROWNING    Klebsiella oxytoca NOT DETECTED NOT DETECTED Final   Klebsiella pneumoniae NOT DETECTED NOT DETECTED Final   Proteus species NOT DETECTED NOT DETECTED Final   Serratia marcescens NOT DETECTED NOT DETECTED Final   Carbapenem resistance NOT DETECTED NOT DETECTED Final   Haemophilus influenzae NOT DETECTED NOT DETECTED Final   Neisseria meningitidis NOT DETECTED NOT DETECTED Final   Pseudomonas aeruginosa NOT DETECTED NOT DETECTED Final   Candida albicans NOT DETECTED NOT DETECTED Final   Candida glabrata NOT DETECTED NOT DETECTED Final   Candida krusei NOT DETECTED NOT DETECTED Final   Candida parapsilosis NOT DETECTED NOT DETECTED Final   Candida tropicalis NOT DETECTED NOT DETECTED Final    Comment: Performed at [facility], [place] [address], [place], [place] [address]  Culture, Urine     Status: Abnormal   Collection Time: 10/25/18  3:57 PM  Result Value Ref Range Status   Specimen Description URINE, RANDOM  Final   Special Requests   Final    NONE Performed at [facility], [place] [address], [place], [place] [address]    Culture MULTIPLE SPECIES PRESENT, SUGGEST RECOLLECTION (A)  Final   Report Status 10/27/2018 FINAL  Final         Radiology Studies: Dg Chest 1 View  Result Date: 10/29/2018 CLINICAL DATA:  Difficulty breathing EXAM: CHEST  1 VIEW COMPARISON:  10/25/2018  FINDINGS: Cardiac shadow is mildly enlarged. Aortic calcifications and mitral annular calcifications are noted. Elevation of the right hemidiaphragm is seen. Platelike atelectasis is again noted on the left. No focal confluent infiltrate is seen. No bony abnormality is noted. IMPRESSION: Stable appearance of the chest from the previous exam. Electronically Signed   By: Inez Catalina M.D.   On: 10/29/2018 16:43        Scheduled Meds: . diltiazem  120 mg Oral Daily   . gabapentin  300 mg Oral Daily  . insulin aspart  0-5 Units Subcutaneous QHS  . insulin aspart  0-9 Units Subcutaneous TID WC  . latanoprost  1 drop Both Eyes QHS  . metoprolol tartrate  12.5 mg Oral BID  . mirtazapine  7.5 mg Oral QHS  . mometasone-formoterol  2 puff Inhalation BID  . multivitamin with minerals  1 tablet Oral Daily  . oxybutynin  5 mg Oral QHS  . pantoprazole  40 mg Oral BID  . potassium chloride  40 mEq Oral Q4H  . Warfarin - Pharmacist Dosing Inpatient   Does not apply q1800   Continuous Infusions: . cefTRIAXone (ROCEPHIN)  IV 2 g (10/30/18 0822)  . magnesium sulfate 1 - 4 g bolus IVPB       LOS: 4 days   Time spent: [place], MD [facility] Pager [phone]  If 7PM-7AM, please contact night-coverage [email or web address] Password [facility] 10/30/2018, 9:37 AM

## 2018-10-30 NOTE — Clinical Social Work Placement (Signed)
   CLINICAL SOCIAL WORK PLACEMENT  NOTE  Date:  10/30/2018  Patient Details  Name: [name] [name] MRN: [id number] Date of Birth: 05/12/1930  Clinical Social Work is seeking post-discharge placement for this patient at the [place] level of care (*CSW will initial, date and re-position this form in  chart as items are completed):      Patient/family provided with [facility] Work Department's list of facilities offering this level of care within the geographic area requested by the patient (or if unable, by the patient's family).      Patient/family informed of their freedom to choose among providers that offer the needed level of care, that participate in Medicare, Medicaid or managed care program needed by the patient, have an available bed and are willing to accept the patient.      Patient/family informed of [facility]'s ownership interest in [facility] and [facility], as well as of the fact that they are under no obligation to receive care at these facilities.  PASRR submitted to EDS on 10/30/18     PASRR number received on       Existing PASRR number confirmed on 10/30/18     FL2 transmitted to all facilities in geographic area requested by pt/family on 10/29/18     FL2 transmitted to all facilities within larger geographic area on       Patient informed that his/her managed care company has contracts with or will negotiate with certain facilities, including the following:            Patient/family informed of bed offers received.  Patient chooses bed at       Physician recommends and patient chooses bed at      Patient to be transferred to   on  .  Patient to be transferred to facility by       Patient family notified on   of transfer.  Name of family member notified:        PHYSICIAN Please sign FL2     Additional Comment:    _______________________________________________ Candie Chroman, LCSW 10/30/2018, 11:13  AM

## 2018-10-30 NOTE — Plan of Care (Signed)
  Problem: Education: Goal: Knowledge of General Education information will improve Description Including pain rating scale, medication(s)/side effects and non-pharmacologic comfort measures Outcome: Progressing   Problem: Clinical Measurements: Goal: Diagnostic test results will improve Outcome: Progressing Goal: Respiratory complications will improve Outcome: Progressing   

## 2018-10-30 NOTE — NC FL2 (Signed)
[place] MEDICAID FL2 LEVEL OF CARE SCREENING TOOL     IDENTIFICATION  Patient Name: [name] [name] Birthdate: [birth year] Sex: female Admission Date (Current Location): 10/25/2018  [facility] and [place] Number:  Herbalist and Address:  [facility] [place], [place] [address] [place], [place] [address]      Provider Number: [id number]  Attending Physician Name and Address:  Bonnell Public, MD  Relative Name and Phone Number:       Current Level of Care: Hospital Recommended Level of Care: [place] Prior Approval Number:    Date Approved/Denied:   PASRR Number: [id number]  Discharge Plan: SNF    Current Diagnoses: Patient Active Problem List   Diagnosis Date Noted  . Bacteremia 10/26/2018  . SIRS (systemic inflammatory response syndrome) ([place]) 10/25/2018  . Hemorrhoids   . Shock ([place])   . DM neuropathy, type II diabetes mellitus ([place]) 01/07/2018  . Hip fracture ([place]) 01/06/2018  . Advance care planning   . Goals of care, counseling/discussion   . COPD (chronic obstructive pulmonary disease) ([place]) 10/28/2017  . RLQ abdominal pain 04/24/2014  . Nausea alone 04/11/2014  . Abdominal pain 08/05/2012    Class: Acute  . Irritable bowel syndrome 04/12/2012  . Chronic diarrhea 04/12/2012  . Encounter for long-term (current) use of anticoagulants 03/17/2012  . Depression 03/13/2012  . Anxiety 03/13/2012  . Mitral regurgitation 03/11/2012  . Chronic epigastric pain 01/25/2012  . Coagulopathy ([place]) 12/12/2011  . Symptomatic anemia 12/12/2011  . Atrial fibrillation    . UTI (urinary tract infection) 11/13/2011  . DM II (diabetes mellitus, type II), controlled ([place]) 11/13/2011  . HTN (hypertension) 11/13/2011  . Hypercholesteremia 11/13/2011  . Glaucoma (increased eye pressure) 11/13/2011    Orientation RESPIRATION BLADDER Height & Weight     Self, Time, Place, Situation  O2(Nasal Canula 1 L) Incontinent, External  catheter Weight: 148 lb 2.4 oz (67.2 kg) Height:  5' (152.4 cm)  BEHAVIORAL SYMPTOMS/MOOD NEUROLOGICAL BOWEL NUTRITION STATUS  (None) (None) Continent Diet(DYS 3. Meds crushed in puree.)  AMBULATORY STATUS COMMUNICATION OF NEEDS Skin   Extensive Assist Verbally Bruising                       Personal Care Assistance Level of Assistance  Bathing, Feeding, Dressing Bathing Assistance: Maximum assistance Feeding assistance: Limited assistance Dressing Assistance: Maximum assistance     Functional Limitations Info  Sight, Hearing, Speech Sight Info: Adequate Hearing Info: Adequate Speech Info: Adequate    SPECIAL CARE FACTORS FREQUENCY  PT (By licensed PT), Blood pressure, OT (By licensed OT)     PT Frequency: 5 x week OT Frequency: 5 x week            Contractures Contractures Info: Not present    Additional Factors Info  Code Status, Allergies, Psychotropic Code Status Info: Full code Allergies Info: Lactose Intolerance (GI), Penicillins, Sulfa Antibiotics, Ciprofloxacin, Sulfasalazine. Psychotropic Info: Anxiety, Depression: Remeron 7.5 mg PO QHS, Ativan 1 mg PO every 8 hours prn.         Current Medications (10/30/2018):  This is the current hospital active medication list Current Facility-Administered Medications  Medication Dose Route Frequency Provider Last Rate Last Dose  . acetaminophen (TYLENOL) tablet 650 mg  650 mg Oral Q6H PRN Elgergawy, Silver Huguenin, MD   650 mg at 10/30/18 0102   Or  . acetaminophen (TYLENOL) suppository 650 mg  650 mg Rectal Q6H PRN Elgergawy, Silver Huguenin, MD      .  albuterol (PROVENTIL) (2.5 MG/3ML) 0.083% nebulizer solution 2.5 mg  2.5 mg Nebulization Q2H PRN Elgergawy, Silver Huguenin, MD      . cefTRIAXone (ROCEPHIN) 2 g in sodium chloride 0.9 % 100 mL IVPB  2 g Intravenous Q24H Shelly Coss, MD 200 mL/hr at 10/30/18 0822 2 g at 10/30/18 1157  . diltiazem (CARDIZEM CD) 24 hr capsule 120 mg  120 mg Oral Daily Elgergawy, Silver Huguenin, MD   120 mg  at 10/30/18 0820  . gabapentin (NEURONTIN) capsule 300 mg  300 mg Oral Daily Elgergawy, Silver Huguenin, MD   300 mg at 10/30/18 2620  . insulin aspart (novoLOG) injection 0-5 Units  0-5 Units Subcutaneous QHS Adhikari, Amrit, MD      . insulin aspart (novoLOG) injection 0-9 Units  0-9 Units Subcutaneous TID WC Shelly Coss, MD   2 Units at 10/30/18 [phone]  . latanoprost (XALATAN) 0.005 % ophthalmic solution 1 drop  1 drop Both Eyes QHS Elgergawy, Silver Huguenin, MD   1 drop at 10/29/18 2211  . loperamide (IMODIUM) capsule 2 mg  2 mg Oral PRN Shelly Coss, MD   2 mg at 10/29/18 1727  . LORazepam (ATIVAN) tablet 1 mg  1 mg Oral Q8H PRN Elgergawy, Silver Huguenin, MD   1 mg at 10/29/18 2208  . magnesium sulfate 3 g in dextrose 5 % 100 mL IVPB  3 g Intravenous Once Dana Allan I, MD 53 mL/hr at 10/30/18 1022 3 g at 10/30/18 1022  . metoprolol tartrate (LOPRESSOR) tablet 12.5 mg  12.5 mg Oral BID Shelly Coss, MD   12.5 mg at 10/30/18 7416  . mirtazapine (REMERON) tablet 7.5 mg  7.5 mg Oral QHS Shelly Coss, MD   7.5 mg at 10/29/18 2210  . mometasone-formoterol (DULERA) 200-5 MCG/ACT inhaler 2 puff  2 puff Inhalation BID Elgergawy, Silver Huguenin, MD   2 puff at 10/30/18 [phone]  . multivitamin with minerals tablet 1 tablet  1 tablet Oral Daily Elgergawy, Silver Huguenin, MD   1 tablet at 10/30/18 0820  . ondansetron (ZOFRAN) tablet 4 mg  4 mg Oral Q6H PRN Elgergawy, Silver Huguenin, MD       Or  . ondansetron (ZOFRAN) injection 4 mg  4 mg Intravenous Q6H PRN Elgergawy, Silver Huguenin, MD      . oxybutynin (DITROPAN) tablet 5 mg  5 mg Oral QHS Elgergawy, Silver Huguenin, MD   5 mg at 10/29/18 2208  . pantoprazole (PROTONIX) EC tablet 40 mg  40 mg Oral BID Elgergawy, Silver Huguenin, MD   40 mg at 10/30/18 3646  . potassium chloride SA (K-DUR,KLOR-CON) CR tablet 40 mEq  40 mEq Oral Q4H Dana Allan I, MD   40 mEq at 10/30/18 1022  . sodium chloride flush (NS) 0.9 % injection 10-40 mL  10-40 mL Intracatheter PRN Shelly Coss, MD      .  Warfarin - Pharmacist Dosing Inpatient   Does not apply q1800 Harvel Quale, [facility]   Stopped at 10/28/18 1800     Discharge Medications: Please see discharge summary for a list of discharge medications.  Relevant Imaging Results:  Relevant Lab Results:   Additional Information SS#: [id number]  Candie Chroman, LCSW

## 2018-10-31 LAB — RENAL FUNCTION PANEL
Albumin: 2.4 g/dL — ABNORMAL LOW (ref 3.5–5.0)
Anion gap: 11 (ref 5–15)
BUN: 8 mg/dL (ref 8–23)
CO2: 25 mmol/L (ref 22–32)
Calcium: 8.6 mg/dL — ABNORMAL LOW (ref 8.9–10.3)
Chloride: 101 mmol/L (ref 98–111)
Creatinine, Ser: 0.75 mg/dL (ref 0.44–1.00)
GFR calc Af Amer: >60 mL/min (ref >60)
GFR calc non Af Amer: >60 mL/min (ref >60)
Glucose, Bld: 147 mg/dL — ABNORMAL HIGH (ref 70–99)
Phosphorus: 2.7 mg/dL (ref 2.5–4.6)
Potassium: 4.4 mmol/L (ref 3.5–5.1)
Sodium: 137 mmol/L (ref 135–145)

## 2018-10-31 LAB — GLUCOSE, CAPILLARY
GLUCOSE-CAPILLARY: 140 mg/dL — AB (ref 70–99)
Glucose-Capillary: 155 mg/dL — ABNORMAL HIGH (ref 70–99)
Glucose-Capillary: 151 mg/dL — ABNORMAL HIGH (ref 70–99)
Glucose-Capillary: 133 mg/dL — ABNORMAL HIGH (ref 70–99)

## 2018-10-31 LAB — URINE CULTURE: Culture: 40000 — AB

## 2018-10-31 LAB — PROTIME-INR
INR: 3.03
Prothrombin Time: 31 seconds — ABNORMAL HIGH (ref 11.4–15.2)

## 2018-10-31 LAB — MAGNESIUM: Magnesium: 1.8 mg/dL (ref 1.7–2.4)

## 2018-10-31 MED ORDER — CEPHALEXIN 250 MG PO CAPS
250.0000 mg | ORAL_CAPSULE | Freq: Three times a day (TID) | ORAL | Status: AC
  Administered 2018-10-31 – 2018-11-03 (×9): 250 mg via ORAL
  Filled 2018-10-31 (×11): qty 1

## 2018-10-31 MED ORDER — FOSFOMYCIN TROMETHAMINE 3 G PO PACK
3.0000 g | PACK | Freq: Once | ORAL | Status: AC
  Administered 2018-10-31: 3 g via ORAL
  Filled 2018-10-31: qty 3

## 2018-10-31 MED ORDER — WARFARIN SODIUM 1 MG PO TABS
1.0000 mg | ORAL_TABLET | Freq: Once | ORAL | Status: AC
  Administered 2018-10-31: 1 mg via ORAL
  Filled 2018-10-31: qty 1

## 2018-10-31 NOTE — Evaluation (Signed)
Occupational Therapy Evaluation Patient Details Name: [name] [name] MRN: [id number] DOB: 08-Jan-1930 Today's Date: 10/31/2018    History of Present Illness Patient 85 y/o female presenting to hospital 1/21 with fever, chills, and incontinence seconday to bacteremia/sepsis. Patient also has AMS due diagnosis and a UTI. PMH includes DMII, COPD, HTN, a fib, syncope, and carotid stenosis. Patient has hx of recent fall resulting in ankle/toe fx and is suppose to wear walking boot when out of house   Clinical Impression   Pt typically walks with a rollator and can perform ADL with occasional assistance prior to admission. She is not responsible for IADL at home. She presents with anxiety about falling, impaired balance and generalized weakness. She required min assist for mobility and ADL this visit. Recommending short term rehab in SNF prior to return home.     Follow Up Recommendations  SNF    Equipment Recommendations  None recommended by OT    Recommendations for Other Services       Precautions / Restrictions Precautions Precautions: Fall Required Braces or Orthoses: Other Brace(wears R walking boot but not for inside ambulation) Restrictions Weight Bearing Restrictions: No      Mobility Bed Mobility Overal bed mobility: Needs Assistance Bed Mobility: Sit to Supine           General bed mobility comments: assist for LEs back into bed  Transfers Overall transfer level: Needs assistance Equipment used: Rolling walker (2 wheeled) Transfers: Sit to/from [facility] Sit to Stand: Min assist Stand pivot transfers: Min assist       General transfer comment: steadying assist, cues for hand placement    Balance Overall balance assessment: Needs assistance Sitting-balance support: Feet supported;No upper extremity supported Sitting balance-Leahy Scale: Fair     Standing balance support: Bilateral upper extremity supported Standing balance-Leahy  Scale: Poor Standing balance comment: reliant on BUE and RW to maintain standing balance                           ADL either performed or assessed with clinical judgement   ADL Overall ADL's : Needs assistance/impaired Eating/Feeding: Independent;Sitting   Grooming: Set up;Sitting   Upper Body Bathing: Set up;Sitting   Lower Body Bathing: Minimal assistance;Sit to/from stand   Upper Body Dressing : Set up;Sitting   Lower Body Dressing: Minimal assistance;Sit to/from stand   Toilet Transfer: Minimal assistance;Stand-pivot;BSC;RW   Toileting- Clothing Manipulation and Hygiene: Minimal assistance;Sit to/from stand       Functional mobility during ADLs: Minimal assistance;Rolling walker       Vision Baseline Vision/History: Wears glasses Wears Glasses: At all times Patient Visual Report: No change from baseline       Perception     Praxis      Pertinent Vitals/Pain Pain Assessment: Faces Faces Pain Scale: No hurt     Hand Dominance Right   Extremity/Trunk Assessment Upper Extremity Assessment Upper Extremity Assessment: Overall WFL for tasks assessed   Lower Extremity Assessment Lower Extremity Assessment: Defer to PT evaluation   Cervical / Trunk Assessment Cervical / Trunk Assessment: Kyphotic   Communication Communication Communication: No difficulties   Cognition Arousal/Alertness: Awake/alert Behavior During Therapy: WFL for tasks assessed/performed Overall Cognitive Status: Within Functional Limits for tasks assessed                                     General Comments  patient reports "my head feels funny" when ambulating, no nystagmus noted    Exercises     Shoulder Instructions      Home Living Family/patient expects to be discharged to:: Private residence Living Arrangements: Spouse/significant other Available Help at Discharge: Family;Available 24 hours/day Type of Home: House Home Access: Ramped entrance      Home Layout: One level     Bathroom Shower/Tub: Teacher, early years/pre: Handicapped height     Home Equipment: Environmental consultant - 4 wheels;Shower seat;Toilet riser;Grab bars - tub/shower;Hand held shower head;Wheelchair - manual(adjustable bed)   Additional Comments: per pt and husband      Prior Functioning/Environment Level of Independence: Needs assistance  Gait / Transfers Assistance Needed: ambulates with rollator but uses wheelchair for longer distances ADL's / Homemaking Assistance Needed: sponge bathes with occasional assist for ADL, husband and paid help do IADL            OT Problem List: Decreased activity tolerance;Impaired balance (sitting and/or standing);Decreased knowledge of use of DME or AE;Decreased strength      OT Treatment/Interventions: Self-care/ADL training;DME and/or AE instruction;Patient/family education;Balance training    OT Goals(Current goals can be found in the care plan section) Acute Rehab OT Goals Patient Stated Goal: not fall OT Goal Formulation: With patient Time For Goal Achievement: 11/14/18 Potential to Achieve Goals: Good ADL Goals Pt Will Perform Grooming: with supervision;standing Pt Will Perform Lower Body Bathing: with supervision;sit to/from stand Pt Will Perform Lower Body Dressing: with supervision;sit to/from stand Pt Will Transfer to Toilet: with supervision;ambulating;bedside commode Pt Will Perform Toileting - Clothing Manipulation and hygiene: with supervision;sit to/from stand  OT Frequency: Min 2X/week   Barriers to D/C:            Co-evaluation              AM-PAC OT "6 Clicks" Daily Activity     Outcome Measure Help from another person eating meals?: None Help from another person taking care of personal grooming?: A Little Help from another person toileting, which includes using toliet, bedpan, or urinal?: A Little Help from another person bathing (including washing, rinsing, drying)?: A  Little Help from another person to put on and taking off regular upper body clothing?: None Help from another person to put on and taking off regular lower body clothing?: A Little 6 Click Score: 20   End of Session Equipment Utilized During Treatment: Gait belt;Rolling walker;Oxygen Nurse Communication: Other (comment)(needs pure wick replaced)  Activity Tolerance: Patient tolerated treatment well Patient left: in bed;with call bell/phone within reach;with family/visitor present  OT Visit Diagnosis: Unsteadiness on feet (R26.81);Other abnormalities of gait and mobility (R26.89);Muscle weakness (generalized) (M62.81);History of falling (Z91.81)                Time: 5176-1607 OT Time Calculation (min): 32 min Charges:  OT General Charges $OT Visit: 1 Visit OT Evaluation $OT Eval Moderate Complexity: 1 Mod OT Treatments $Self Care/Home Management : 8-22 mins  Nestor Lewandowsky, OTR/L Acute Rehabilitation Services Pager: [phone] Office: [phone]  Malka So 10/31/2018, 3:19 PM

## 2018-10-31 NOTE — Progress Notes (Signed)
Physical Therapy Treatment Patient Details Name: [name] [name] MRN: [id number] DOB: 1930-08-04 Today's Date: 10/31/2018    History of Present Illness Patient 85 y/o female presenting to hospital 1/21 with fever, chills, and incontinence seconday to bacteremia/sepsis. Patient also has AMS due diagnosis and a UTI. PMH includes DMII, COPD, HTN, a fib, syncope, and carotid stenosis. Patient has hx of recent fall resulting in ankle/toe fx and is suppose to wear walking boot when out of house    PT Comments    Pt with improved ability to transfer today and was able to tolerate 10' of ambulation today with max encouragement. Pt remains very deconditioned but desired to work with PT daily. PT explained role of PT in the hospital versus rehab. Acute PT to cont to follow to progress mobility.    Follow Up Recommendations  SNF;Supervision for mobility/OOB     Equipment Recommendations  None recommended by PT    Recommendations for Other Services       Precautions / Restrictions Precautions Precautions: Fall Restrictions Weight Bearing Restrictions: No    Mobility  Bed Mobility Overal bed mobility: Modified Independent             General bed mobility comments: HOB elevated, no physical assist needed, used bed rail to pull self over  Transfers Overall transfer level: Needs assistance Equipment used: Rolling walker (2 wheeled) Transfers: Sit to/from Stand Sit to Stand: Min assist;Mod assist Stand pivot transfers: Min assist;Mod assist       General transfer comment: min/modA to power up and maintain balance during the transition of hands from bed to RW. pt with increased fear of falling during std pvt to chair requiring modA due to retropulsion  Ambulation/Gait Ambulation/Gait assistance: Mod assist;+2 safety/equipment Gait Distance (Feet): 10 Feet Assistive device: Rolling walker (2 wheeled) Gait Pattern/deviations: Step-through pattern;Decreased stride length Gait  velocity: slow Gait velocity interpretation: <1.8 ft/sec, indicate of risk for recurrent falls General Gait Details: max encouragement, pt with quick onset of faitgue, pt with c/o "my head feels funny, no dizzy just funny"   Stairs             Wheelchair Mobility    Modified Rankin (Stroke Patients Only)       Balance Overall balance assessment: Needs assistance Sitting-balance support: Feet supported;No upper extremity supported Sitting balance-Leahy Scale: Fair     Standing balance support: Bilateral upper extremity supported Standing balance-Leahy Scale: Poor Standing balance comment: reliant on BUE and RW to maintain standing balance                            Cognition Arousal/Alertness: Awake/alert Behavior During Therapy: WFL for tasks assessed/performed Overall Cognitive Status: Within Functional Limits for tasks assessed                                        Exercises      General Comments General comments (skin integrity, edema, etc.): patient reports "my head feels funny" when ambulating, no nystagmus noted      Pertinent Vitals/Pain Pain Assessment: Faces Faces Pain Scale: No hurt    Home Living                      Prior Function            PT Goals (current goals can now be found  in the care plan section) Acute Rehab PT Goals Patient Stated Goal: amb Progress towards PT goals: Progressing toward goals    Frequency    Min 2X/week      PT Plan Current plan remains appropriate    Co-evaluation              AM-PAC PT "6 Clicks" Mobility   Outcome Measure  Help needed turning from your back to your side while in a flat bed without using bedrails?: A Little Help needed moving from lying on your back to sitting on the side of a flat bed without using bedrails?: A Little Help needed moving to and from a bed to a chair (including a wheelchair)?: A Lot Help needed standing up from a chair  using your arms (e.g., wheelchair or bedside chair)?: A Lot Help needed to walk in hospital room?: A Lot Help needed climbing 3-5 steps with a railing? : A Lot 6 Click Score: 14    End of Session Equipment Utilized During Treatment: Gait belt;Oxygen Activity Tolerance: Patient limited by fatigue Patient left: in chair;with call bell/phone within reach;with chair alarm set;with family/visitor present Nurse Communication: Mobility status PT Visit Diagnosis: Unsteadiness on feet (R26.81);Difficulty in walking, not elsewhere classified (R26.2);History of falling (Z91.81);Dizziness and giddiness (R42)     Time: 9767-3419 PT Time Calculation (min) (ACUTE ONLY): 20 min  Charges:  $Gait Training: 8-22 mins                     Kittie Plater, PT, DPT Acute Rehabilitation Services Pager #: [phone] Office #: [phone]    Berline Lopes 10/31/2018, 2:26 PM

## 2018-10-31 NOTE — Progress Notes (Signed)
ANTICOAGULATION CONSULT NOTE  Pharmacy Consult for warfarin Indication: atrial fibrillation  Patient Measurements: Height: 5' (152.4 cm) Weight: 142 lb 6.7 oz (64.6 kg) IBW/kg (Calculated) : 45.5   Vital Signs: Temp: 97.5 F (36.4 C) (01/27 0429) Temp Source: Oral (01/27 0429) BP: 130/90 (01/27 0429) Pulse Rate: 85 (01/26 2356)  Labs: Recent Labs    10/29/18 0630 10/29/18 1001 10/30/18 0500 10/31/18 0441  HGB  --  11.5* 11.0*  --   HCT  --  34.7* 33.8*  --   PLT  --  219 235  --   LABPROT 28.5*  --  27.5* 31.0*  INR 2.73  --  2.60 3.03  CREATININE  --  0.78 0.72 0.75     Medical History: Past Medical History:  Diagnosis Date  . Atrial fibrillation (HCC)    RVR hx.  Chronic Coumadin  . Bacteremia 10/2018  . C. difficile colitis 09/21/2013, 11/2013  . Carotid stenosis    a. Carotid US (10/2013):  bilat 1-39%; f/u 1 year  . Chronic lower back pain   . COPD (chronic obstructive pulmonary disease) ([place])   . Depression   . Diverticulosis of colon without hemorrhage   . GERD (gastroesophageal reflux disease)    with HH  . Glaucoma   . HCAP (healthcare-associated pneumonia) 11/21/2011  . High cholesterol   . Hypertension   . Metabolic encephalopathy 04/07/813  . Mitral regurgitation 03/11/2012  . Pancreatitis    ~2008, 11/2011 post ERCP  . Pneumonia 12/2011   "first time I know about"  . Sepsis ([place]) 11/27/2013  . SIRS (systemic inflammatory response syndrome) ([place]) 03/13/2012   a/w post ERCP  pancreatitis.   . Syncope   . Type II diabetes mellitus [facility])     Assessment:  85 yo female with recent fall + resultant ankle fracture noted with EColi bacteremia. She has AFib and is on warfarin prior to admission.  INR 1.99>>2.49>>3.05>>2.73>>2.6>>3.03 - slightly supratherapeutic. Hgb 11, plt 235. No s/sx of bleeding.  Home warfarin regimen: 2.5mg /d except take 5mg  MWF  Goal of Therapy:  INR 2-3 Monitor platelets by anticoagulation protocol: Yes   Plan:  -Warfarin  at 1 mg po x 1 tonight -Daily INR  Antonietta Jewel, PharmD, BCCCP Clinical Pharmacist  Pager: [phone] Phone: [phone] Please see AMION for all Pharmacists' Contact Phone Numbers 10/31/2018, 7:34 AM

## 2018-10-31 NOTE — Progress Notes (Signed)
PROGRESS NOTE    [name] [name]  LPF:[id number] DOB: [birth year] DOA: 10/25/2018 PCP: [name], MD   Brief Narrative: Patient is 85 year old female with past medical history of chronic A. fib on warfarin, carotid stenosis, COPD, GERD, hypertension, type 2 diabetes, C. difficile who presents to the emergency  department with complaints of fever, chills, dysuria and incontinence.  She is from home and lives with her husband.  She ambulates with the help of rolling walker.  When she presented she was febrile with temperature of 101.9. ,  Had leukocytosis.  UA was abnormal.  Urinary tract infection was suspected on presentation.  Currently blood cultures showing E. coli.  Started on ceftriaxone.  Currently hemodynamically stable.  10/28/2018: Patient seen.  No significant history from patient.  Patient remains drowsy/lethargic.  Urine and blood culture results noted.  Continue IV antibiotics for now.  10/29/2018: Patient seen alongside patient's husband.  Patient is more interactive today, however, still having intermittent episodes of mild drowsiness.  Will discontinue IV fluids.  Will give 1 dose of IV Lasix.  Lab work in a.m.  Will repeat chest x-ray.  Will also repeat urinalysis and urine culture.  Continue IV antibiotics for now.  10/30/2018: Patient seen alongside patient's nurse.  Patient looks better today.  Will give 1 more dose of IV Lasix 40 mg x 1 dose.  Will repeat blood cultures.  Consult OT.  Physical therapy input is highly appreciated.  Likely discharge in the next 1 to 2 days.  10/31/2018: Patient seen alongside patient's husband.  Patient looks a lot better today.  Patient is sitting out in a chair.  Patient is a lot more communicative and alert.  Repeat urine culture is growing Enterococcus faecalis!.  Prior urine culture grew multiple organisms, but the blood culture grew E. coli.  Will complete course of antibiotics treatment for E. coli bacteremia.  Discussed extensively with  the pharmacy team, will also give 1 dose of fosfomycin.  Kindly pursue disposition.  Assessment & Plan:   Principal Problem:   Bacteremia Active Problems:   UTI (urinary tract infection)   DM II (diabetes mellitus, type II), controlled (HCC)   HTN (hypertension)   Atrial fibrillation    Anxiety   COPD (chronic obstructive pulmonary disease) (HCC)   DM neuropathy, type II diabetes mellitus (HCC)   SIRS (systemic inflammatory response syndrome) (HCC)  UTI/sepsis secondary to E. coli:  Presented with fever, tachycardia, lactic acidosis.  She was confused on presentation. UA was abnormal. Blood cultures showing E. coli. Likely source is urinary.  Currently on ceftriaxone 2 g daily.  Currently she is afebrile.  Hemodynamically stable.  We will continue ceftriaxone until we get the final sensitivity. 10/28/2018: Urine and blood culture grew E. coli.  Continue IV ceftriaxone for now.  Culture result is noted. 10/29/2018: Repeat urinalysis and urine culture. 10/30/2018: Follow repeat urine culture.  Reculture patient's blood.  Continue IV antibiotics for now. 10/31/2018:  Patient seen alongside patient's husband.  Patient looks a lot better today.  Patient is sitting out in a chair.  Patient is a lot more communicative and alert.  Repeat urine culture is growing Enterococcus faecalis!.  Prior urine culture grew multiple organisms, but the blood culture grew E. coli.  Will complete course of antibiotics treatment for E. coli bacteremia.  Discussed extensively with the pharmacy team, will also give 1 dose of fosfomycin.  Kindly pursue disposition.  Altered mental status: Metabolic encephalopathy due to sepsis.  Mental status has improved this  morning.  Currently she is alert and oriented. 10/28/2018: Patient remains drowsy/lethargic.  Continue to treat underlying sepsis. 10/29/2018: Improving slowly.  More interactive today. 10/30/2018: Improved significantly.  Patient is still on more communicative  today. 10/31/2018: Resolved significantly.  Chronic A. fib with RVR: Heart rate better. She was given Cardizem IV push and was started on Cardizem drip on presentation.  She is on oral Cardizem and metoprolol at home which we will continue..  On warfarin for anticoagulation.  Continue monitoring INR daily.  Dysphagia: She has history of dysphagia.  She follows with GI as an outpatient.  Speech therapy already evaluated her and recommended dysphagia 3 diet.  COPD: Currently stable.  Continue inhalers and bronchodilators.  Type 2 diabetes mellitus: On insulin at home.  Lantus and sliding scale insulin will be continued.  Hypertension: Currently blood pressure stable.  We will continue to monitor.  Deconditioning/debility/recent foot fracture: Physical therapy will evaluate her.  DVT prophylaxis:Warfarin Code Status: Full code Family Communication: Discussed with husband on the phone today Disposition Plan: Skilled nursing facility placement.  Pursue disposition.  Patient is medically stable for discharge.  Consultants: None  Procedures: None  Antimicrobials:  Anti-infectives (From admission, onward)   Start     Dose/Rate Route Frequency Ordered Stop   10/31/18 1400  cephALEXin (KEFLEX) capsule 250 mg     250 mg Oral Every 8 hours 10/31/18 1315 11/03/18 1359   10/27/18 0800  cefTRIAXone (ROCEPHIN) 1 g in sodium chloride 0.9 % 100 mL IVPB  Status:  Discontinued     1 g 200 mL/hr over 30 Minutes Intravenous Every 24 hours 10/26/18 0400 10/26/18 0937   10/27/18 0800  cefTRIAXone (ROCEPHIN) 2 g in sodium chloride 0.9 % 100 mL IVPB  Status:  Discontinued     2 g 200 mL/hr over 30 Minutes Intravenous Every 24 hours 10/26/18 0937 10/31/18 1315   10/26/18 1400  cefTRIAXone (ROCEPHIN) 1 g in sodium chloride 0.9 % 100 mL IVPB  Status:  Discontinued     1 g 200 mL/hr over 30 Minutes Intravenous Every 24 hours 10/25/18 1556 10/25/18 1625   10/26/18 1400  cefTRIAXone (ROCEPHIN) 1 g in sodium  chloride 0.9 % 100 mL IVPB  Status:  Discontinued     1 g 200 mL/hr over 30 Minutes Intravenous Every 24 hours 10/25/18 1625 10/26/18 0400   10/26/18 0430  cefTRIAXone (ROCEPHIN) 2 g in sodium chloride 0.9 % 100 mL IVPB     2 g 200 mL/hr over 30 Minutes Intravenous  Once 10/26/18 0400 10/26/18 0757   10/25/18 1630  cefTRIAXone (ROCEPHIN) 1 g in sodium chloride 0.9 % 100 mL IVPB  Status:  Discontinued     1 g 200 mL/hr over 30 Minutes Intravenous  Once 10/25/18 1625 10/25/18 1643   10/25/18 1500  cefTRIAXone (ROCEPHIN) 1 g in sodium chloride 0.9 % 100 mL IVPB     1 g 200 mL/hr over 30 Minutes Intravenous  Once 10/25/18 1457 10/25/18 1604      Subjective: Patient seen and examined at bedside this morning.  Hemodynamically stable.  Afebrile.  Still found to be weak.  Lies on the bed most of the time.  Alert and oriented.  Asking when she can go home. Objective: Vitals:   10/31/18 0429 10/31/18 0431 10/31/18 0801 10/31/18 1124  BP: 130/90  (!) 151/86 135/88  Pulse:      Resp: 15  18 (!) 24  Temp: (!) 97.5 F (36.4 C)  (!) 97.5 F (  36.4 C) 97.8 F (36.6 C)  TempSrc: Oral  Oral Oral  SpO2: 97%  93% 95%  Weight:  64.6 kg    Height:        Intake/Output Summary (Last 24 hours) at 10/31/2018 1634 Last data filed at 10/31/2018 0631 Gross per 24 hour  Intake 160 ml  Output 1650 ml  Net -1490 ml   Filed Weights   10/25/18 2015 10/29/18 0451 10/31/18 0431  Weight: 63.3 kg 67.2 kg 64.6 kg    Examination:  General exam: Looks a lot better today.  A lot more interactive today.     HEENT: Pallor.  No jaundice.  Respiratory system: Clear to auscultation.   Cardiovascular system: A. fib , no JVD, murmurs, rubs, gallops or clicks. Gastrointestinal system: Abdomen is nondistended, soft and nontender. No organomegaly or masses felt. Normal bowel sounds heard. Central nervous system: Awake and alert.  Patient moves all limbs. Extremities: Swollen right upper extremity from infiltrated IV  site.    Data Reviewed: I have personally reviewed following labs and imaging studies  CBC: Recent Labs  Lab 10/25/18 1233 10/26/18 0350 10/27/18 0356 10/29/18 1001 10/30/18 0500  WBC 20.6* 17.3* 10.9* 10.1 10.5  NEUTROABS 19.1*  --  7.7 6.9 7.2  HGB 14.4 11.2* 11.2* 11.5* 11.0*  HCT 44.8 34.8* 34.0* 34.7* 33.8*  MCV 93.9 92.3 94.4 92.8 92.3  PLT 245 170 167 219 710   Basic Metabolic Panel: Recent Labs  Lab 10/26/18 0350 10/27/18 0356 10/29/18 1001 10/30/18 0500 10/31/18 0441  NA 139 138 138 139 137  K 3.4* 4.1 3.7 2.9* 4.4  CL 113* 114* 109 103 101  CO2 17* 17* 20* 24 25  GLUCOSE 109* 154* 198* 140* 147*  BUN 14 12 6* 5* 8  CREATININE 0.99 0.98 0.78 0.72 0.75  CALCIUM 8.0* 8.1* 8.4* 8.6* 8.6*  MG  --   --  1.4* 1.4* 1.8  PHOS  --   --  2.6 2.6 2.7   GFR: Estimated Creatinine Clearance: 42.3 mL/min (by C-G formula based on SCr of 0.75 mg/dL). Liver Function Tests: Recent Labs  Lab 10/25/18 1233 10/29/18 1001 10/30/18 0500 10/31/18 0441  AST 33  --   --   --   ALT 18  --   --   --   ALKPHOS 89  --   --   --   BILITOT 1.2  --   --   --   PROT 7.2  --   --   --   ALBUMIN 3.5 2.5* 2.6* 2.4*   No results for input(s): LIPASE, AMYLASE in the last 168 hours. No results for input(s): AMMONIA in the last 168 hours. Coagulation Profile: Recent Labs  Lab 10/27/18 0356 10/28/18 0407 10/29/18 0630 10/30/18 0500 10/31/18 0441  INR 2.49 3.05 2.73 2.60 3.03   Cardiac Enzymes: No results for input(s): CKTOTAL, CKMB, CKMBINDEX, TROPONINI in the last 168 hours. BNP (last 3 results) No results for input(s): PROBNP in the last 8760 hours. HbA1C: No results for input(s): HGBA1C in the last 72 hours. CBG: Recent Labs  Lab 10/30/18 1119 10/30/18 1555 10/30/18 2135 10/31/18 0627 10/31/18 1122  GLUCAP 159* 229* 204* 140* 155*   Lipid Profile: No results for input(s): CHOL, HDL, LDLCALC, TRIG, CHOLHDL, LDLDIRECT in the last 72 hours. Thyroid Function  Tests: No results for input(s): TSH, T4TOTAL, FREET4, T3FREE, THYROIDAB in the last 72 hours. Anemia Panel: No results for input(s): VITAMINB12, FOLATE, FERRITIN, TIBC, IRON, RETICCTPCT in the last  72 hours. Sepsis Labs: Recent Labs  Lab 10/25/18 1330 10/25/18 1517  LATICACIDVEN 2.0* 2.2*    Recent Results (from the past 240 hour(s))  Culture, blood (routine x 2)     Status: Abnormal   Collection Time: 10/25/18 12:50 PM  Result Value Ref Range Status   Specimen Description BLOOD BLOOD RIGHT HAND  Final   Special Requests   Final    BOTTLES DRAWN AEROBIC AND ANAEROBIC Blood Culture adequate volume   Culture  Setup Time   Final    GRAM NEGATIVE RODS AEROBIC BOTTLE ONLY CRITICAL VALUE NOTED.  VALUE IS CONSISTENT WITH PREVIOUSLY REPORTED AND CALLED VALUE.    Culture ESCHERICHIA COLI (A)  Final   Report Status 10/28/2018 FINAL  Final   Organism ID, Bacteria ESCHERICHIA COLI  Final      Susceptibility   Escherichia coli - MIC*    AMPICILLIN >=32 RESISTANT Resistant     CEFAZOLIN 16 SENSITIVE Sensitive     CEFEPIME <=1 SENSITIVE Sensitive     CEFTAZIDIME <=1 SENSITIVE Sensitive     CEFTRIAXONE <=1 SENSITIVE Sensitive     CIPROFLOXACIN <=0.25 SENSITIVE Sensitive     GENTAMICIN <=1 SENSITIVE Sensitive     IMIPENEM <=0.25 SENSITIVE Sensitive     TRIMETH/SULFA <=20 SENSITIVE Sensitive     AMPICILLIN/SULBACTAM >=32 RESISTANT Resistant     PIP/TAZO <=4 SENSITIVE Sensitive     Extended ESBL NEGATIVE Sensitive     * ESCHERICHIA COLI  Culture, blood (routine x 2)     Status: Abnormal   Collection Time: 10/25/18 12:55 PM  Result Value Ref Range Status   Specimen Description BLOOD BLOOD LEFT HAND  Final   Special Requests   Final    BOTTLES DRAWN AEROBIC AND ANAEROBIC Blood Culture adequate volume   Culture  Setup Time   Final    GRAM NEGATIVE RODS ANAEROBIC BOTTLE ONLY CRITICAL RESULT CALLED TO, READ BACK BY AND VERIFIED WITH: Denton Brick PHARMD [id number] 10/26/2018 A BROWNING    Culture  (A)  Final    ESCHERICHIA COLI SUSCEPTIBILITIES PERFORMED ON PREVIOUS CULTURE WITHIN THE LAST 5 DAYS. Performed at [facility], [place] [address] [place], [place] [address]    Report Status 10/28/2018 FINAL  Final  Blood Culture ID Panel (Reflexed)     Status: Abnormal   Collection Time: 10/25/18 12:55 PM  Result Value Ref Range Status   Enterococcus species NOT DETECTED NOT DETECTED Final   Listeria monocytogenes NOT DETECTED NOT DETECTED Final   Staphylococcus species NOT DETECTED NOT DETECTED Final   Staphylococcus aureus (BCID) NOT DETECTED NOT DETECTED Final   Streptococcus species NOT DETECTED NOT DETECTED Final   Streptococcus agalactiae NOT DETECTED NOT DETECTED Final   Streptococcus pneumoniae NOT DETECTED NOT DETECTED Final   Streptococcus pyogenes NOT DETECTED NOT DETECTED Final   Acinetobacter baumannii NOT DETECTED NOT DETECTED Final   Enterobacteriaceae species DETECTED (A) NOT DETECTED Final    Comment: Enterobacteriaceae represent a large family of gram-negative bacteria, not a single organism. CRITICAL RESULT CALLED TO, READ BACK BY AND VERIFIED WITH: Denton Brick PHARMD [id number] 10/26/2018 A BROWNING    Enterobacter cloacae complex NOT DETECTED NOT DETECTED Final   Escherichia coli DETECTED (A) NOT DETECTED Final    Comment: CRITICAL RESULT CALLED TO, READ BACK BY AND VERIFIED WITH: Denton Brick PHARMD [id number] 10/26/2018 A BROWNING    Klebsiella oxytoca NOT DETECTED NOT DETECTED Final   Klebsiella pneumoniae NOT DETECTED NOT DETECTED Final   Proteus species NOT DETECTED  NOT DETECTED Final   Serratia marcescens NOT DETECTED NOT DETECTED Final   Carbapenem resistance NOT DETECTED NOT DETECTED Final   Haemophilus influenzae NOT DETECTED NOT DETECTED Final   Neisseria meningitidis NOT DETECTED NOT DETECTED Final   Pseudomonas aeruginosa NOT DETECTED NOT DETECTED Final   Candida albicans NOT DETECTED NOT DETECTED Final   Candida glabrata NOT DETECTED NOT DETECTED Final    Candida krusei NOT DETECTED NOT DETECTED Final   Candida parapsilosis NOT DETECTED NOT DETECTED Final   Candida tropicalis NOT DETECTED NOT DETECTED Final    Comment: Performed at [facility], [place] [address] [place], [place] [address]  Culture, Urine     Status: Abnormal   Collection Time: 10/25/18  3:57 PM  Result Value Ref Range Status   Specimen Description URINE, RANDOM  Final   Special Requests   Final    NONE Performed at [facility], [place] [address] [place], [place] [address]    Culture MULTIPLE SPECIES PRESENT, SUGGEST RECOLLECTION (A)  Final   Report Status 10/27/2018 FINAL  Final  Culture, Urine     Status: Abnormal   Collection Time: 10/29/18  9:57 AM  Result Value Ref Range Status   Specimen Description URINE, CATHETERIZED  Final   Special Requests   Final    NONE Performed at [facility], [place] [address], [place], [place] [address]    Culture 40,000 COLONIES/mL ENTEROCOCCUS FAECALIS (A)  Final   Report Status 10/31/2018 FINAL  Final   Organism ID, Bacteria ENTEROCOCCUS FAECALIS (A)  Final      Susceptibility   Enterococcus faecalis - MIC*    AMPICILLIN <=2 SENSITIVE Sensitive     LEVOFLOXACIN >=8 RESISTANT Resistant     NITROFURANTOIN <=16 SENSITIVE Sensitive     VANCOMYCIN 1 SENSITIVE Sensitive     * 40,000 COLONIES/mL ENTEROCOCCUS FAECALIS  Culture, blood (routine x 2)     Status: None (Preliminary result)   Collection Time: 10/30/18  2:34 PM  Result Value Ref Range Status   Specimen Description BLOOD LEFT ANTECUBITAL  Final   Special Requests   Final    BOTTLES DRAWN AEROBIC ONLY Blood Culture adequate volume   Culture   Final    NO GROWTH < 24 HOURS Performed at [facility], [place] [address], [place],  [place] [address]    Report Status PENDING  Incomplete  Culture, blood (routine x 2)     Status: None (Preliminary result)   Collection Time: 10/30/18  2:40 PM  Result Value Ref Range Status   Specimen Description BLOOD LEFT HAND   Final   Special Requests   Final    BOTTLES DRAWN AEROBIC ONLY Blood Culture adequate volume   Culture   Final    NO GROWTH < 24 HOURS Performed at [facility], [place] [address], [place], [place] [address]    Report Status PENDING  Incomplete         Radiology Studies: No results found.      Scheduled Meds: . cephALEXin  250 mg Oral Q8H  . diltiazem  120 mg Oral Daily  . gabapentin  300 mg Oral Daily  . insulin aspart  0-5 Units Subcutaneous QHS  . insulin aspart  0-9 Units Subcutaneous TID WC  . latanoprost  1 drop Both Eyes QHS  . metoprolol tartrate  12.5 mg Oral BID  . mirtazapine  7.5 mg Oral QHS  . mometasone-formoterol  2 puff Inhalation BID  .  multivitamin with minerals  1 tablet Oral Daily  . oxybutynin  5 mg Oral QHS  . pantoprazole  40 mg Oral BID  . warfarin  1 mg Oral ONCE-1800  . Warfarin - Pharmacist Dosing Inpatient   Does not apply q1800   Continuous Infusions:    LOS: 5 days   Time spent: [place], MD [facility] Pager [phone]  If 7PM-7AM, please contact night-coverage [email or web address] Password [facility] 10/31/2018, 4:34 PM

## 2018-10-31 NOTE — Progress Notes (Signed)
Abx Consult:  Pt with current e.coli bacteremia on D7 of ceftriaxone. D/w Dr. Marthenia Rolling today and will narrow to Keflex x 3 more days to complete 10d. He would also like to treat the enterococcus in the urine. We will give 1 dose of fosfomycin.   Keflex 250mg  TID x3d Fosfomycin 3g PO x1  Onnie Boer, PharmD, [place], AAHIVP, CPP Infectious Disease Pharmacist 10/31/2018 1:19 PM

## 2018-11-01 LAB — GLUCOSE, CAPILLARY
GLUCOSE-CAPILLARY: 185 mg/dL — AB (ref 70–99)
GLUCOSE-CAPILLARY: 180 mg/dL — AB (ref 70–99)
Glucose-Capillary: 190 mg/dL — ABNORMAL HIGH (ref 70–99)
Glucose-Capillary: 186 mg/dL — ABNORMAL HIGH (ref 70–99)

## 2018-11-01 LAB — PROTIME-INR
INR: 2.86
Prothrombin Time: 29.6 seconds — ABNORMAL HIGH (ref 11.4–15.2)

## 2018-11-01 MED ORDER — WARFARIN SODIUM 2.5 MG PO TABS
2.5000 mg | ORAL_TABLET | Freq: Once | ORAL | Status: AC
  Administered 2018-11-01: 2.5 mg via ORAL
  Filled 2018-11-01: qty 1

## 2018-11-01 NOTE — Progress Notes (Signed)
ANTICOAGULATION CONSULT NOTE  Pharmacy Consult for warfarin Indication: atrial fibrillation  Patient Measurements: Height: 5' (152.4 cm) Weight: 142 lb 6.7 oz (64.6 kg) IBW/kg (Calculated) : 45.5   Vital Signs: Temp: 97.6 F (36.4 C) (01/28 0810) Temp Source: Oral (01/28 0810) BP: 128/86 (01/28 0810) Pulse Rate: 100 (01/28 0852)  Labs: Recent Labs    10/29/18 1001 10/30/18 0500 10/31/18 0441 11/01/18 0309  HGB 11.5* 11.0*  --   --   HCT 34.7* 33.8*  --   --   PLT 219 235  --   --   LABPROT  --  27.5* 31.0* 29.6*  INR  --  2.60 3.03 2.86  CREATININE 0.78 0.72 0.75  --      Medical History: Past Medical History:  Diagnosis Date  . Atrial fibrillation (HCC)    RVR hx.  Chronic Coumadin  . Bacteremia 10/2018  . C. difficile colitis 09/21/2013, 11/2013  . Carotid stenosis    a. Carotid US (10/2013):  bilat 1-39%; f/u 1 year  . Chronic lower back pain   . COPD (chronic obstructive pulmonary disease) ([place])   . Depression   . Diverticulosis of colon without hemorrhage   . GERD (gastroesophageal reflux disease)    with HH  . Glaucoma   . HCAP (healthcare-associated pneumonia) 11/21/2011  . High cholesterol   . Hypertension   . Metabolic encephalopathy 03/10/6811  . Mitral regurgitation 03/11/2012  . Pancreatitis    ~2008, 11/2011 post ERCP  . Pneumonia 12/2011   "first time I know about"  . Sepsis ([place]) 11/27/2013  . SIRS (systemic inflammatory response syndrome) ([place]) 03/13/2012   a/w post ERCP  pancreatitis.   . Syncope   . Type II diabetes mellitus [facility])     [name] [name]:  85 yo female with recent fall + resultant ankle fracture noted with EColi bacteremia. She has AFib and is on warfarin prior to admission.  INR is therapeutic at 2.86 (decreased from 3.03). Hgb 11, plt 235. No s/sx of bleeding.  Home warfarin regimen: 2.5mg /d except take 5mg  MWF  Goal of Therapy:  INR 2-3 Monitor platelets by anticoagulation protocol: Yes   Plan:  -Warfarin 2.5 mg po x 1  tonight -Daily INR  Antonietta Jewel, PharmD, BCCCP Clinical Pharmacist  Pager: [phone] Phone: [phone] Please see AMION for all Pharmacists' Contact Phone Numbers 11/01/2018, 9:00 AM

## 2018-11-01 NOTE — Progress Notes (Signed)
PROGRESS NOTE    [name] [name]  YBW:[id number] DOB: 11-16-1929 DOA: 10/25/2018 PCP: [name], MD   Brief Narrative: Patient is an 85 year old female with past medical history of chronic atrial fibrillation on warfarin, carotid stenosis, COPD, GERD, hypertension, type 2 diabetes and C. difficile.  Patient presented with fever, chills, dysuria and incontinence.  Work-up revealed UTI sepsis secondary to E. coli.  Repeat urine culture after a few days in the hospital also grew enterococcus faecalis.  Patient has received 7-day course of IV Rocephin, and currently completing treatment course for the E. coli bacteremia with 3-day course of Keflex.  1 dose of IV fosfomycin was given for the enterococcus UTI.  Patient is awaiting discharge to skilled nursing facility.  Patient is stable for discharge.  Encephalopathy, likely combined toxic and metabolic has resolved.  Assessment & Plan:   Principal Problem:   Bacteremia Active Problems:   UTI (urinary tract infection)   DM II (diabetes mellitus, type II), controlled (HCC)   HTN (hypertension)   Atrial fibrillation    Anxiety   COPD (chronic obstructive pulmonary disease) (HCC)   DM neuropathy, type II diabetes mellitus (HCC)   SIRS (systemic inflammatory response syndrome) (HCC)  UTI/sepsis secondary to E. coli/enterococcus faecalis UTI:  -Patient has received 7-day course of ceftriaxone 2 g daily, and about to complete 3-day course of oral Keflex. -1 dose of IV fosfomycin already received for possible Enterococcus faecalis UTI. -Sepsis physiology has resolved. -Positive disposition.  Acute combined metabolic and toxic encephalopathy:  This is likely secondary to above.   Resolved significantly.    Volume overload: Given IV Lasix 40 mg daily x2 days. Resolved significantly. Continue to monitor.   Chronic A. fib with RVR:  Patient was initially on Cardizem drip.   Back to metoprolol.   On warfarin for anticoagulation.     Continue to monitor.   Dysphagia:  Patient has history of dysphagia.   Follows with GI as an outpatient.   Speech therapy already evaluated her and recommended dysphagia 3 diet. Consider repeating a speech evaluation as the patient's mental status has improved significantly  COPD:  Currently stable.   Continue inhalers and bronchodilators.  Type 2 diabetes mellitus:  She was on Lantus insulin at home.   Currently on Sliding scale insulin. Continue to monitor and optimize.  Hypertension:  Optimize.   Continue to monitor.    Deconditioning/debility/recent foot fracture:  Proceed disposition (SNF).  DVT prophylaxis:Warfarin Code Status: Full code Family Communication:  Disposition Plan: Skilled nursing facility placement.  Pursue disposition.  Patient is medically stable for discharge.  Consultants: None  Procedures: None  Antimicrobials:  Anti-infectives (From admission, onward)   Start     Dose/Rate Route Frequency Ordered Stop   10/31/18 1400  cephALEXin (KEFLEX) capsule 250 mg     250 mg Oral Every 8 hours 10/31/18 1315 11/03/18 1359   10/27/18 0800  cefTRIAXone (ROCEPHIN) 1 g in sodium chloride 0.9 % 100 mL IVPB  Status:  Discontinued     1 g 200 mL/hr over 30 Minutes Intravenous Every 24 hours 10/26/18 0400 10/26/18 0937   10/27/18 0800  cefTRIAXone (ROCEPHIN) 2 g in sodium chloride 0.9 % 100 mL IVPB  Status:  Discontinued     2 g 200 mL/hr over 30 Minutes Intravenous Every 24 hours 10/26/18 0937 10/31/18 1315   10/26/18 1400  cefTRIAXone (ROCEPHIN) 1 g in sodium chloride 0.9 % 100 mL IVPB  Status:  Discontinued     1 g 200  mL/hr over 30 Minutes Intravenous Every 24 hours 10/25/18 1556 10/25/18 1625   10/26/18 1400  cefTRIAXone (ROCEPHIN) 1 g in sodium chloride 0.9 % 100 mL IVPB  Status:  Discontinued     1 g 200 mL/hr over 30 Minutes Intravenous Every 24 hours 10/25/18 1625 10/26/18 0400   10/26/18 0430  cefTRIAXone (ROCEPHIN) 2 g in sodium chloride 0.9 % 100  mL IVPB     2 g 200 mL/hr over 30 Minutes Intravenous  Once 10/26/18 0400 10/26/18 0757   10/25/18 1630  cefTRIAXone (ROCEPHIN) 1 g in sodium chloride 0.9 % 100 mL IVPB  Status:  Discontinued     1 g 200 mL/hr over 30 Minutes Intravenous  Once 10/25/18 1625 10/25/18 1643   10/25/18 1500  cefTRIAXone (ROCEPHIN) 1 g in sodium chloride 0.9 % 100 mL IVPB     1 g 200 mL/hr over 30 Minutes Intravenous  Once 10/25/18 1457 10/25/18 1604      Subjective: Patient seen and examined at bedside this morning. No fever or chills No shortness of breath No dysuria  Objective: Vitals:   11/01/18 0624 11/01/18 0810 11/01/18 0852 11/01/18 1227  BP: 135/69 128/86  135/61  Pulse: 97 84 100 99  Resp: (!) 22 (!) 23 20 20   Temp: 97.8 F (36.6 C) 97.6 F (36.4 C)  97.9 F (36.6 C)  TempSrc: Oral Oral  Oral  SpO2: 97% 97% 95% 99%  Weight:      Height:        Intake/Output Summary (Last 24 hours) at 11/01/2018 1431 Last data filed at 10/31/2018 2338 Gross per 24 hour  Intake 240 ml  Output 1700 ml  Net -1460 ml   Filed Weights   10/25/18 2015 10/29/18 0451 10/31/18 0431  Weight: 63.3 kg 67.2 kg 64.6 kg    Examination:  General exam: Looks a lot better today.  A lot more interactive today.     HEENT: Pallor.  No jaundice.  Respiratory system: Clear to auscultation.   Cardiovascular system: A. fib , no JVD, murmurs, rubs, gallops or clicks. Gastrointestinal system: Abdomen is nondistended, soft and nontender. No organomegaly or masses felt. Normal bowel sounds heard. Central nervous system: Awake and alert.  Patient moves all limbs. Extremities: No significant edema elicited.  Data Reviewed: I have personally reviewed following labs and imaging studies  CBC: Recent Labs  Lab 10/26/18 0350 10/27/18 0356 10/29/18 1001 10/30/18 0500  WBC 17.3* 10.9* 10.1 10.5  NEUTROABS  --  7.7 6.9 7.2  HGB 11.2* 11.2* 11.5* 11.0*  HCT 34.8* 34.0* 34.7* 33.8*  MCV 92.3 94.4 92.8 92.3  PLT 170 167  219 761   Basic Metabolic Panel: Recent Labs  Lab 10/26/18 0350 10/27/18 0356 10/29/18 1001 10/30/18 0500 10/31/18 0441  NA 139 138 138 139 137  K 3.4* 4.1 3.7 2.9* 4.4  CL 113* 114* 109 103 101  CO2 17* 17* 20* 24 25  GLUCOSE 109* 154* 198* 140* 147*  BUN 14 12 6* 5* 8  CREATININE 0.99 0.98 0.78 0.72 0.75  CALCIUM 8.0* 8.1* 8.4* 8.6* 8.6*  MG  --   --  1.4* 1.4* 1.8  PHOS  --   --  2.6 2.6 2.7   GFR: Estimated Creatinine Clearance: 42.3 mL/min (by C-G formula based on SCr of 0.75 mg/dL). Liver Function Tests: Recent Labs  Lab 10/29/18 1001 10/30/18 0500 10/31/18 0441  ALBUMIN 2.5* 2.6* 2.4*   No results for input(s): LIPASE, AMYLASE in the last 168  hours. No results for input(s): AMMONIA in the last 168 hours. Coagulation Profile: Recent Labs  Lab 10/28/18 0407 10/29/18 0630 10/30/18 0500 10/31/18 0441 11/01/18 0309  INR 3.05 2.73 2.60 3.03 2.86   Cardiac Enzymes: No results for input(s): CKTOTAL, CKMB, CKMBINDEX, TROPONINI in the last 168 hours. BNP (last 3 results) No results for input(s): PROBNP in the last 8760 hours. HbA1C: No results for input(s): HGBA1C in the last 72 hours. CBG: Recent Labs  Lab 10/31/18 1122 10/31/18 1646 10/31/18 2135 11/01/18 0628 11/01/18 1129  GLUCAP 155* 151* 133* 190* 185*   Lipid Profile: No results for input(s): CHOL, HDL, LDLCALC, TRIG, CHOLHDL, LDLDIRECT in the last 72 hours. Thyroid Function Tests: No results for input(s): TSH, T4TOTAL, FREET4, T3FREE, THYROIDAB in the last 72 hours. Anemia Panel: No results for input(s): VITAMINB12, FOLATE, FERRITIN, TIBC, IRON, RETICCTPCT in the last 72 hours. Sepsis Labs: Recent Labs  Lab 10/25/18 1517  LATICACIDVEN 2.2*    Recent Results (from the past 240 hour(s))  Culture, blood (routine x 2)     Status: Abnormal   Collection Time: 10/25/18 12:50 PM  Result Value Ref Range Status   Specimen Description BLOOD BLOOD RIGHT HAND  Final   Special Requests   Final     BOTTLES DRAWN AEROBIC AND ANAEROBIC Blood Culture adequate volume   Culture  Setup Time   Final    GRAM NEGATIVE RODS AEROBIC BOTTLE ONLY CRITICAL VALUE NOTED.  VALUE IS CONSISTENT WITH PREVIOUSLY REPORTED AND CALLED VALUE.    Culture ESCHERICHIA COLI (A)  Final   Report Status 10/28/2018 FINAL  Final   Organism ID, Bacteria ESCHERICHIA COLI  Final      Susceptibility   Escherichia coli - MIC*    AMPICILLIN >=32 RESISTANT Resistant     CEFAZOLIN 16 SENSITIVE Sensitive     CEFEPIME <=1 SENSITIVE Sensitive     CEFTAZIDIME <=1 SENSITIVE Sensitive     CEFTRIAXONE <=1 SENSITIVE Sensitive     CIPROFLOXACIN <=0.25 SENSITIVE Sensitive     GENTAMICIN <=1 SENSITIVE Sensitive     IMIPENEM <=0.25 SENSITIVE Sensitive     TRIMETH/SULFA <=20 SENSITIVE Sensitive     AMPICILLIN/SULBACTAM >=32 RESISTANT Resistant     PIP/TAZO <=4 SENSITIVE Sensitive     Extended ESBL NEGATIVE Sensitive     * ESCHERICHIA COLI  Culture, blood (routine x 2)     Status: Abnormal   Collection Time: 10/25/18 12:55 PM  Result Value Ref Range Status   Specimen Description BLOOD BLOOD LEFT HAND  Final   Special Requests   Final    BOTTLES DRAWN AEROBIC AND ANAEROBIC Blood Culture adequate volume   Culture  Setup Time   Final    GRAM NEGATIVE RODS ANAEROBIC BOTTLE ONLY CRITICAL RESULT CALLED TO, READ BACK BY AND VERIFIED WITH: Denton Brick PHARMD [id number] 10/26/2018 A BROWNING    Culture (A)  Final    ESCHERICHIA COLI SUSCEPTIBILITIES PERFORMED ON PREVIOUS CULTURE WITHIN THE LAST 5 DAYS. Performed at [facility], [place] [address], [place], [place] [address]    Report Status 10/28/2018 FINAL  Final  Blood Culture ID Panel (Reflexed)     Status: Abnormal   Collection Time: 10/25/18 12:55 PM  Result Value Ref Range Status   Enterococcus species NOT DETECTED NOT DETECTED Final   Listeria monocytogenes NOT DETECTED NOT DETECTED Final   Staphylococcus species NOT DETECTED NOT DETECTED Final   Staphylococcus aureus (BCID)  NOT DETECTED NOT DETECTED Final   Streptococcus species NOT DETECTED  NOT DETECTED Final   Streptococcus agalactiae NOT DETECTED NOT DETECTED Final   Streptococcus pneumoniae NOT DETECTED NOT DETECTED Final   Streptococcus pyogenes NOT DETECTED NOT DETECTED Final   Acinetobacter baumannii NOT DETECTED NOT DETECTED Final   Enterobacteriaceae species DETECTED (A) NOT DETECTED Final    Comment: Enterobacteriaceae represent a large family of gram-negative bacteria, not a single organism. CRITICAL RESULT CALLED TO, READ BACK BY AND VERIFIED WITH: Denton Brick PHARMD [id number] 10/26/2018 A BROWNING    Enterobacter cloacae complex NOT DETECTED NOT DETECTED Final   Escherichia coli DETECTED (A) NOT DETECTED Final    Comment: CRITICAL RESULT CALLED TO, READ BACK BY AND VERIFIED WITH: Denton Brick PHARMD [id number] 10/26/2018 A BROWNING    Klebsiella oxytoca NOT DETECTED NOT DETECTED Final   Klebsiella pneumoniae NOT DETECTED NOT DETECTED Final   Proteus species NOT DETECTED NOT DETECTED Final   Serratia marcescens NOT DETECTED NOT DETECTED Final   Carbapenem resistance NOT DETECTED NOT DETECTED Final   Haemophilus influenzae NOT DETECTED NOT DETECTED Final   Neisseria meningitidis NOT DETECTED NOT DETECTED Final   Pseudomonas aeruginosa NOT DETECTED NOT DETECTED Final   Candida albicans NOT DETECTED NOT DETECTED Final   Candida glabrata NOT DETECTED NOT DETECTED Final   Candida krusei NOT DETECTED NOT DETECTED Final   Candida parapsilosis NOT DETECTED NOT DETECTED Final   Candida tropicalis NOT DETECTED NOT DETECTED Final    Comment: Performed at [facility], [place] [address] [place], [place] [address]  Culture, Urine     Status: Abnormal   Collection Time: 10/25/18  3:57 PM  Result Value Ref Range Status   Specimen Description URINE, RANDOM  Final   Special Requests   Final    NONE Performed at [facility], [place] [address] [place], [place] [address]    Culture MULTIPLE SPECIES PRESENT, SUGGEST  RECOLLECTION (A)  Final   Report Status 10/27/2018 FINAL  Final  Culture, Urine     Status: Abnormal   Collection Time: 10/29/18  9:57 AM  Result Value Ref Range Status   Specimen Description URINE, CATHETERIZED  Final   Special Requests   Final    NONE Performed at [facility], [place] [address], [place], [place] [address]    Culture 40,000 COLONIES/mL ENTEROCOCCUS FAECALIS (A)  Final   Report Status 10/31/2018 FINAL  Final   Organism ID, Bacteria ENTEROCOCCUS FAECALIS (A)  Final      Susceptibility   Enterococcus faecalis - MIC*    AMPICILLIN <=2 SENSITIVE Sensitive     LEVOFLOXACIN >=8 RESISTANT Resistant     NITROFURANTOIN <=16 SENSITIVE Sensitive     VANCOMYCIN 1 SENSITIVE Sensitive     * 40,000 COLONIES/mL ENTEROCOCCUS FAECALIS  Culture, blood (routine x 2)     Status: None (Preliminary result)   Collection Time: 10/30/18  2:34 PM  Result Value Ref Range Status   Specimen Description BLOOD LEFT ANTECUBITAL  Final   Special Requests   Final    BOTTLES DRAWN AEROBIC ONLY Blood Culture adequate volume   Culture   Final    NO GROWTH 2 DAYS Performed at [facility], [place] [address] [place],  [address]    Report Status PENDING  Incomplete  Culture, blood (routine x 2)     Status: None (Preliminary result)   Collection Time: 10/30/18  2:40 PM  Result Value Ref Range Status   Specimen Description BLOOD LEFT HAND  Final   Special Requests  Final    BOTTLES DRAWN AEROBIC ONLY Blood Culture adequate volume   Culture   Final    NO GROWTH 2 DAYS Performed at [facility], [place] [address], [place], [place] [address]    Report Status PENDING  Incomplete         Radiology Studies: No results found.      Scheduled Meds: . cephALEXin  250 mg Oral Q8H  . diltiazem  120 mg Oral Daily  . gabapentin  300 mg Oral Daily  . insulin aspart  0-5 Units Subcutaneous QHS  . insulin aspart  0-9 Units Subcutaneous TID WC  . latanoprost  1 drop Both Eyes QHS    . metoprolol tartrate  12.5 mg Oral BID  . mirtazapine  7.5 mg Oral QHS  . mometasone-formoterol  2 puff Inhalation BID  . multivitamin with minerals  1 tablet Oral Daily  . oxybutynin  5 mg Oral QHS  . pantoprazole  40 mg Oral BID  . warfarin  2.5 mg Oral ONCE-1800  . Warfarin - Pharmacist Dosing Inpatient   Does not apply q1800   Continuous Infusions:    LOS: 6 days   Time spent: [place], MD [facility] Pager [phone]  If 7PM-7AM, please contact night-coverage [email or web address] Password [facility] 11/01/2018, 2:31 PM

## 2018-11-01 NOTE — Care Management Important Message (Signed)
Important Message  Patient Details  Name: [name] [name] MRN: [id number] Date of Birth: 03-Oct-1930   Medicare Important Message Given:  Yes    Tommy Medal 11/01/2018, 11:01 AM

## 2018-11-02 LAB — GLUCOSE, CAPILLARY
Glucose-Capillary: 160 mg/dL — ABNORMAL HIGH (ref 70–99)
Glucose-Capillary: 174 mg/dL — ABNORMAL HIGH (ref 70–99)
Glucose-Capillary: 199 mg/dL — ABNORMAL HIGH (ref 70–99)
Glucose-Capillary: 163 mg/dL — ABNORMAL HIGH (ref 70–99)

## 2018-11-02 LAB — PROTIME-INR
INR: 2.48
Prothrombin Time: 26.5 seconds — ABNORMAL HIGH (ref 11.4–15.2)

## 2018-11-02 MED ORDER — WARFARIN SODIUM 2.5 MG PO TABS
2.5000 mg | ORAL_TABLET | Freq: Once | ORAL | Status: AC
  Administered 2018-11-02: 2.5 mg via ORAL
  Filled 2018-11-02: qty 1

## 2018-11-02 NOTE — Progress Notes (Signed)
PROGRESS NOTE    [name] [name]  RKY:[id number] DOB: 1930/03/28 DOA: 10/25/2018 PCP: [name], MD   Brief Narrative: Patient is a 85 year old female with past medical history of chronic A. fib on warfarin, carotid stenosis, COPD, GERD, hypertension, type 2 diabetes mellitus who presented with fever, chills, dysuria and incontinence.  Work-up revealed UTI sepsis secondary to E. coli.  Urine culture also revealed Enterococcus faecalis.  Was treated initially with IV Rocephin now on Keflex.  She also got a dose of IV fosfomycin for enterococcus UTI.  She is waiting for skilled nursing facility bed.  Assessment & Plan:   Principal Problem:   Bacteremia Active Problems:   UTI (urinary tract infection)   DM II (diabetes mellitus, type II), controlled (HCC)   HTN (hypertension)   Atrial fibrillation    Anxiety   COPD (chronic obstructive pulmonary disease) (HCC)   DM neuropathy, type II diabetes mellitus (HCC)   SIRS (systemic inflammatory response syndrome) (HCC)  Gram-negative bacteremia/sepsis present on admission: Presented with fever, tachycardia, lactic acidosis.  She was confused on presentation. UA was abnormal. Blood cultures showed pansensitive E. coli.  She was treated with ceftriaxone.  Now on Keflex. Hemodynamically stable.   Urinary tract infection: Urine cultures were Enterococcus faecalis.  She received a dose of fosfomycin  Altered mental status: Metabolic encephalopathy due to sepsis.  Mental status has improved and is on baseline.  Currently she is alert and oriented.  Chronic A. fib with RVR: Heart rate better. She was given Cardizem IV push and was started on Cardizem drip on presentation.  She is on oral Cardizem and metoprolol at home which we will continue  On warfarin for anticoagulation.  Continue monitoring INR daily.  Dysphagia: She has history of dysphagia.  She follows with GI as an outpatient.  Speech therapy already evaluated her and recommended  dysphagia 3 diet.  COPD: Currently stable.  Continue inhalers and bronchodilators.  Type 2 diabetes mellitus: On insulin at home.  Sliding scale insulin will be continued.  Hypertension: Currently blood pressure stable.  We will continue to monitor.  Deconditioning/debility/recent foot fracture: Physical therapy will evaluated her and recommended skilled nursing facility on discharge.  Waiting for authorization.          DVT prophylaxis: warfarin Code Status: Full Family Communication: None present at the bedside Disposition Plan: Skilled nursing facility as soon as the bed is available   Consultants: None  Procedures: None  Antimicrobials:  Anti-infectives (From admission, onward)   Start     Dose/Rate Route Frequency Ordered Stop   10/31/18 1400  cephALEXin (KEFLEX) capsule 250 mg     250 mg Oral Every 8 hours 10/31/18 1315 11/03/18 1359   10/27/18 0800  cefTRIAXone (ROCEPHIN) 1 g in sodium chloride 0.9 % 100 mL IVPB  Status:  Discontinued     1 g 200 mL/hr over 30 Minutes Intravenous Every 24 hours 10/26/18 0400 10/26/18 0937   10/27/18 0800  cefTRIAXone (ROCEPHIN) 2 g in sodium chloride 0.9 % 100 mL IVPB  Status:  Discontinued     2 g 200 mL/hr over 30 Minutes Intravenous Every 24 hours 10/26/18 0937 10/31/18 1315   10/26/18 1400  cefTRIAXone (ROCEPHIN) 1 g in sodium chloride 0.9 % 100 mL IVPB  Status:  Discontinued     1 g 200 mL/hr over 30 Minutes Intravenous Every 24 hours 10/25/18 1556 10/25/18 1625   10/26/18 1400  cefTRIAXone (ROCEPHIN) 1 g in sodium chloride 0.9 % 100 mL IVPB  Status:  Discontinued     1 g 200 mL/hr over 30 Minutes Intravenous Every 24 hours 10/25/18 1625 10/26/18 0400   10/26/18 0430  cefTRIAXone (ROCEPHIN) 2 g in sodium chloride 0.9 % 100 mL IVPB     2 g 200 mL/hr over 30 Minutes Intravenous  Once 10/26/18 0400 10/26/18 0757   10/25/18 1630  cefTRIAXone (ROCEPHIN) 1 g in sodium chloride 0.9 % 100 mL IVPB  Status:  Discontinued     1  g 200 mL/hr over 30 Minutes Intravenous  Once 10/25/18 1625 10/25/18 1643   10/25/18 1500  cefTRIAXone (ROCEPHIN) 1 g in sodium chloride 0.9 % 100 mL IVPB     1 g 200 mL/hr over 30 Minutes Intravenous  Once 10/25/18 1457 10/25/18 1604      Subjective: Patient seen and examined at bedside this morning.  Looks comfortable.  She was working with physical therapy.  Denies any chest pain or shortness of breath.  Heart rate is well controlled.  Hemodynamically stable  Objective: Vitals:   11/01/18 2044 11/02/18 0757 11/02/18 0858 11/02/18 0942  BP:  (!) 144/85    Pulse:  (!) 101 (!) 117 100  Resp:  (!) 23 17   Temp:  98.1 F (36.7 C)    TempSrc:  Oral    SpO2: 97% 93% 96%   Weight:      Height:        Intake/Output Summary (Last 24 hours) at 11/02/2018 1102 Last data filed at 11/02/2018 0800 Gross per 24 hour  Intake 340 ml  Output 2450 ml  Net -2110 ml   Filed Weights   10/25/18 2015 10/29/18 0451 10/31/18 0431  Weight: 63.3 kg 67.2 kg 64.6 kg    Examination:  General exam: Appears calm and comfortable ,Not in distress,average built, elderly female HEENT:PERRL,Oral mucosa moist, Ear/Nose normal on gross exam Respiratory system: Bilateral equal air entry, normal vesicular breath sounds, no wheezes or crackles  Cardiovascular system: Afib, No JVD, murmurs, rubs, gallops or clicks. No pedal edema. Gastrointestinal system: Abdomen is nondistended, soft and nontender. No organomegaly or masses felt. Normal bowel sounds heard. Central nervous system: Alert and oriented. No focal neurological deficits. Extremities: No edema, no clubbing ,no cyanosis, distal peripheral pulses palpable. Skin: No rashes, lesions or ulcers,no icterus ,no pallor MSK: Normal muscle bulk,tone ,power Psychiatry: Judgement and insight appear normal. Mood & affect appropriate.     Data Reviewed: I have personally reviewed following labs and imaging studies  CBC: Recent Labs  Lab 10/27/18 0356  10/29/18 1001 10/30/18 0500  WBC 10.9* 10.1 10.5  NEUTROABS 7.7 6.9 7.2  HGB 11.2* 11.5* 11.0*  HCT 34.0* 34.7* 33.8*  MCV 94.4 92.8 92.3  PLT 167 219 270   Basic Metabolic Panel: Recent Labs  Lab 10/27/18 0356 10/29/18 1001 10/30/18 0500 10/31/18 0441  NA 138 138 139 137  K 4.1 3.7 2.9* 4.4  CL 114* 109 103 101  CO2 17* 20* 24 25  GLUCOSE 154* 198* 140* 147*  BUN 12 6* 5* 8  CREATININE 0.98 0.78 0.72 0.75  CALCIUM 8.1* 8.4* 8.6* 8.6*  MG  --  1.4* 1.4* 1.8  PHOS  --  2.6 2.6 2.7   GFR: Estimated Creatinine Clearance: 42.3 mL/min (by C-G formula based on SCr of 0.75 mg/dL). Liver Function Tests: Recent Labs  Lab 10/29/18 1001 10/30/18 0500 10/31/18 0441  ALBUMIN 2.5* 2.6* 2.4*   No results for input(s): LIPASE, AMYLASE in the last 168 hours. No results for  input(s): AMMONIA in the last 168 hours. Coagulation Profile: Recent Labs  Lab 10/29/18 0630 10/30/18 0500 10/31/18 0441 11/01/18 0309 11/02/18 0600  INR 2.73 2.60 3.03 2.86 2.48   Cardiac Enzymes: No results for input(s): CKTOTAL, CKMB, CKMBINDEX, TROPONINI in the last 168 hours. BNP (last 3 results) No results for input(s): PROBNP in the last 8760 hours. HbA1C: No results for input(s): HGBA1C in the last 72 hours. CBG: Recent Labs  Lab 11/01/18 0628 11/01/18 1129 11/01/18 1620 11/01/18 2126 11/02/18 0630  GLUCAP 190* 185* 180* 186* 160*   Lipid Profile: No results for input(s): CHOL, HDL, LDLCALC, TRIG, CHOLHDL, LDLDIRECT in the last 72 hours. Thyroid Function Tests: No results for input(s): TSH, T4TOTAL, FREET4, T3FREE, THYROIDAB in the last 72 hours. Anemia Panel: No results for input(s): VITAMINB12, FOLATE, FERRITIN, TIBC, IRON, RETICCTPCT in the last 72 hours. Sepsis Labs: No results for input(s): PROCALCITON, LATICACIDVEN in the last 168 hours.  Recent Results (from the past 240 hour(s))  Culture, blood (routine x 2)     Status: Abnormal   Collection Time: 10/25/18 12:50 PM   Result Value Ref Range Status   Specimen Description BLOOD BLOOD RIGHT HAND  Final   Special Requests   Final    BOTTLES DRAWN AEROBIC AND ANAEROBIC Blood Culture adequate volume   Culture  Setup Time   Final    GRAM NEGATIVE RODS AEROBIC BOTTLE ONLY CRITICAL VALUE NOTED.  VALUE IS CONSISTENT WITH PREVIOUSLY REPORTED AND CALLED VALUE.    Culture ESCHERICHIA COLI (A)  Final   Report Status 10/28/2018 FINAL  Final   Organism ID, Bacteria ESCHERICHIA COLI  Final      Susceptibility   Escherichia coli - MIC*    AMPICILLIN >=32 RESISTANT Resistant     CEFAZOLIN 16 SENSITIVE Sensitive     CEFEPIME <=1 SENSITIVE Sensitive     CEFTAZIDIME <=1 SENSITIVE Sensitive     CEFTRIAXONE <=1 SENSITIVE Sensitive     CIPROFLOXACIN <=0.25 SENSITIVE Sensitive     GENTAMICIN <=1 SENSITIVE Sensitive     IMIPENEM <=0.25 SENSITIVE Sensitive     TRIMETH/SULFA <=20 SENSITIVE Sensitive     AMPICILLIN/SULBACTAM >=32 RESISTANT Resistant     PIP/TAZO <=4 SENSITIVE Sensitive     Extended ESBL NEGATIVE Sensitive     * ESCHERICHIA COLI  Culture, blood (routine x 2)     Status: Abnormal   Collection Time: 10/25/18 12:55 PM  Result Value Ref Range Status   Specimen Description BLOOD BLOOD LEFT HAND  Final   Special Requests   Final    BOTTLES DRAWN AEROBIC AND ANAEROBIC Blood Culture adequate volume   Culture  Setup Time   Final    GRAM NEGATIVE RODS ANAEROBIC BOTTLE ONLY CRITICAL RESULT CALLED TO, READ BACK BY AND VERIFIED WITH: Denton Brick PHARMD [id number] 10/26/2018 A BROWNING    Culture (A)  Final    ESCHERICHIA COLI SUSCEPTIBILITIES PERFORMED ON PREVIOUS CULTURE WITHIN THE LAST 5 DAYS. Performed at [facility], [place] [address], [place], [place] [address]    Report Status 10/28/2018 FINAL  Final  Blood Culture ID Panel (Reflexed)     Status: Abnormal   Collection Time: 10/25/18 12:55 PM  Result Value Ref Range Status   Enterococcus species NOT DETECTED NOT DETECTED Final   Listeria monocytogenes NOT  DETECTED NOT DETECTED Final   Staphylococcus species NOT DETECTED NOT DETECTED Final   Staphylococcus aureus (BCID) NOT DETECTED NOT DETECTED Final   Streptococcus species NOT DETECTED NOT DETECTED Final  Streptococcus agalactiae NOT DETECTED NOT DETECTED Final   Streptococcus pneumoniae NOT DETECTED NOT DETECTED Final   Streptococcus pyogenes NOT DETECTED NOT DETECTED Final   Acinetobacter baumannii NOT DETECTED NOT DETECTED Final   Enterobacteriaceae species DETECTED (A) NOT DETECTED Final    Comment: Enterobacteriaceae represent a large family of gram-negative bacteria, not a single organism. CRITICAL RESULT CALLED TO, READ BACK BY AND VERIFIED WITH: Denton Brick PHARMD [id number] 10/26/2018 A BROWNING    Enterobacter cloacae complex NOT DETECTED NOT DETECTED Final   Escherichia coli DETECTED (A) NOT DETECTED Final    Comment: CRITICAL RESULT CALLED TO, READ BACK BY AND VERIFIED WITH: Denton Brick PHARMD [id number] 10/26/2018 A BROWNING    Klebsiella oxytoca NOT DETECTED NOT DETECTED Final   Klebsiella pneumoniae NOT DETECTED NOT DETECTED Final   Proteus species NOT DETECTED NOT DETECTED Final   Serratia marcescens NOT DETECTED NOT DETECTED Final   Carbapenem resistance NOT DETECTED NOT DETECTED Final   Haemophilus influenzae NOT DETECTED NOT DETECTED Final   Neisseria meningitidis NOT DETECTED NOT DETECTED Final   Pseudomonas aeruginosa NOT DETECTED NOT DETECTED Final   Candida albicans NOT DETECTED NOT DETECTED Final   Candida glabrata NOT DETECTED NOT DETECTED Final   Candida krusei NOT DETECTED NOT DETECTED Final   Candida parapsilosis NOT DETECTED NOT DETECTED Final   Candida tropicalis NOT DETECTED NOT DETECTED Final    Comment: Performed at [facility], [place] [address] [place], [place] [address]  Culture, Urine     Status: Abnormal   Collection Time: 10/25/18  3:57 PM  Result Value Ref Range Status   Specimen Description URINE, RANDOM  Final   Special Requests   Final     NONE Performed at [facility], [place] [address], [place], [place] [address]    Culture MULTIPLE SPECIES PRESENT, SUGGEST RECOLLECTION (A)  Final   Report Status 10/27/2018 FINAL  Final  Culture, Urine     Status: Abnormal   Collection Time: 10/29/18  9:57 AM  Result Value Ref Range Status   Specimen Description URINE, CATHETERIZED  Final   Special Requests   Final    NONE Performed at [facility], [place] [address] [place], [place] [address]    Culture 40,000 COLONIES/mL ENTEROCOCCUS FAECALIS (A)  Final   Report Status 10/31/2018 FINAL  Final   Organism ID, Bacteria ENTEROCOCCUS FAECALIS (A)  Final      Susceptibility   Enterococcus faecalis - MIC*    AMPICILLIN <=2 SENSITIVE Sensitive     LEVOFLOXACIN >=8 RESISTANT Resistant     NITROFURANTOIN <=16 SENSITIVE Sensitive     VANCOMYCIN 1 SENSITIVE Sensitive     * 40,000 COLONIES/mL ENTEROCOCCUS FAECALIS  Culture, blood (routine x 2)     Status: None (Preliminary result)   Collection Time: 10/30/18  2:34 PM  Result Value Ref Range Status   Specimen Description BLOOD LEFT ANTECUBITAL  Final   Special Requests   Final    BOTTLES DRAWN AEROBIC ONLY Blood Culture adequate volume   Culture   Final    NO GROWTH 3 DAYS Performed at [facility], [place] [address], [place], [place] [address]    Report Status PENDING  Incomplete  Culture, blood (routine x 2)     Status: None (Preliminary result)   Collection Time: 10/30/18  2:40 PM  Result Value Ref Range Status   Specimen Description BLOOD LEFT HAND  Final   Special Requests   Final  BOTTLES DRAWN AEROBIC ONLY Blood Culture adequate volume   Culture   Final    NO GROWTH 3 DAYS Performed at [facility], [place] [address], [place], [place] [address]    Report Status PENDING  Incomplete         Radiology Studies: No results found.      Scheduled Meds: . cephALEXin  250 mg Oral Q8H  . diltiazem  120 mg Oral Daily  . gabapentin  300 mg Oral Daily  .  insulin aspart  0-5 Units Subcutaneous QHS  . insulin aspart  0-9 Units Subcutaneous TID WC  . latanoprost  1 drop Both Eyes QHS  . metoprolol tartrate  12.5 mg Oral BID  . mirtazapine  7.5 mg Oral QHS  . mometasone-formoterol  2 puff Inhalation BID  . multivitamin with minerals  1 tablet Oral Daily  . oxybutynin  5 mg Oral QHS  . pantoprazole  40 mg Oral BID  . warfarin  2.5 mg Oral ONCE-1800  . Warfarin - Pharmacist Dosing Inpatient   Does not apply q1800   Continuous Infusions:   LOS: 7 days    Time spent: 35 mins.More than 50% of that time was spent in counseling and/or coordination of care.      Shelly Coss, MD [facility] Pager [phone]  If 7PM-7AM, please contact night-coverage [email or web address] Password [email or web address] 11/02/2018, 11:02 AM

## 2018-11-02 NOTE — Progress Notes (Signed)
Physical Therapy Treatment Patient Details Name: [name] [name] MRN: [id number] DOB: 1930/02/27 Today's Date: 11/02/2018    History of Present Illness Patient 85 y/o female presenting to hospital 1/21 with fever, chills, and incontinence seconday to bacteremia/sepsis. Patient also has AMS due diagnosis and a UTI. PMH includes DMII, COPD, HTN, a fib, syncope, and carotid stenosis. Patient has hx of recent fall resulting in ankle/toe fx and is suppose to wear walking boot when out of house    PT Comments    Patient seen for activity progression. Tolerated session well with improvements in activity tolerance. Ambulated on room air with stable saturations.  Patient continues to requires assist for stability and balance. Current POC remains appropriate. Recommend ST SNF upon discharge.  Follow Up Recommendations  SNF;Supervision for mobility/OOB     Equipment Recommendations  None recommended by PT    Recommendations for Other Services       Precautions / Restrictions Precautions Precautions: Fall Required Braces or Orthoses: Other Brace(wears R walking boot, but not for household distances) Restrictions Weight Bearing Restrictions: No    Mobility  Bed Mobility Overal bed mobility: Needs Assistance Bed Mobility: Supine to Sit     Supine to sit: Supervision     General bed mobility comments: supervision for lines, safety  Transfers Overall transfer level: Needs assistance Equipment used: Rolling walker (2 wheeled) Transfers: Sit to/from Stand Sit to Stand: Min assist         General transfer comment: steadying assist, mild posterior lean upon initially standing  Ambulation/Gait Ambulation/Gait assistance: Min assist Gait Distance (Feet): 60 Feet Assistive device: Rolling walker (2 wheeled) Gait Pattern/deviations: Step-through pattern;Decreased stride length Gait velocity: decreased Gait velocity interpretation: <1.8 ft/sec, indicate of risk for recurrent  falls General Gait Details: VC for upright posture and positioning, 3 standing rest breaks. Decreased cadence   Stairs             Wheelchair Mobility    Modified Rankin (Stroke Patients Only)       Balance Overall balance assessment: Needs assistance   Sitting balance-Leahy Scale: Fair     Standing balance support: Bilateral upper extremity supported Standing balance-Leahy Scale: Poor Standing balance comment: reliant on BUE and RW to maintain standing balance                            Cognition Arousal/Alertness: Awake/alert Behavior During Therapy: WFL for tasks assessed/performed Overall Cognitive Status: Within Functional Limits for tasks assessed                                        Exercises      General Comments        Pertinent Vitals/Pain Pain Assessment: No/denies pain    Home Living                      Prior Function            PT Goals (current goals can now be found in the care plan section) Acute Rehab PT Goals Patient Stated Goal: get stronger and return home PT Goal Formulation: With patient Time For Goal Achievement: 11/10/18 Potential to Achieve Goals: Fair Progress towards PT goals: Progressing toward goals    Frequency    Min 2X/week      PT Plan Current plan remains appropriate    Co-evaluation  PT/OT/SLP Co-Evaluation/Treatment: Yes Reason for Co-Treatment: For patient/therapist safety PT goals addressed during session: Mobility/safety with mobility OT goals addressed during session: ADL's and self-care      AM-PAC PT "6 Clicks" Mobility   Outcome Measure  Help needed turning from your back to your side while in a flat bed without using bedrails?: A Little Help needed moving from lying on your back to sitting on the side of a flat bed without using bedrails?: A Little Help needed moving to and from a bed to a chair (including a wheelchair)?: A Lot Help needed standing up  from a chair using your arms (e.g., wheelchair or bedside chair)?: A Lot Help needed to walk in hospital room?: A Lot Help needed climbing 3-5 steps with a railing? : A Lot 6 Click Score: 14    End of Session Equipment Utilized During Treatment: Gait belt Activity Tolerance: Patient limited by fatigue Patient left: in chair;with call bell/phone within reach;with chair alarm set;with family/visitor present Nurse Communication: Mobility status PT Visit Diagnosis: Unsteadiness on feet (R26.81);Difficulty in walking, not elsewhere classified (R26.2);History of falling (Z91.81);Dizziness and giddiness (R42)     Time: 8850-2774 PT Time Calculation (min) (ACUTE ONLY): 16 min  Charges:  $Gait Training: 8-22 mins                     Alben Deeds, PT DPT  Board Certified Neurologic Specialist [place] Pager [phone] Office [phone]    Duncan Dull 11/02/2018, 2:03 PM

## 2018-11-02 NOTE — Progress Notes (Signed)
[facility] for warfarin Indication: atrial fibrillation  Patient Measurements: Height: 5' (152.4 cm) Weight: 142 lb 6.7 oz (64.6 kg) IBW/kg (Calculated) : 45.5   Vital Signs: Temp: 98.1 F (36.7 C) (01/29 0757) Temp Source: Oral (01/29 0757) BP: 144/85 (01/29 0757) Pulse Rate: 117 (01/29 0858)  Labs: Recent Labs    10/31/18 0441 11/01/18 0309 11/02/18 0600  LABPROT 31.0* 29.6* 26.5*  INR 3.03 2.86 2.48  CREATININE 0.75  --   --      Medical History: Past Medical History:  Diagnosis Date  . Atrial fibrillation (HCC)    RVR hx.  Chronic Coumadin  . Bacteremia 10/2018  . C. difficile colitis 09/21/2013, 11/2013  . Carotid stenosis    a. Carotid US (10/2013):  bilat 1-39%; f/u 1 year  . Chronic lower back pain   . COPD (chronic obstructive pulmonary disease) ([place])   . Depression   . Diverticulosis of colon without hemorrhage   . GERD (gastroesophageal reflux disease)    with HH  . Glaucoma   . HCAP (healthcare-associated pneumonia) 11/21/2011  . High cholesterol   . Hypertension   . Metabolic encephalopathy 01/04/7407  . Mitral regurgitation 03/11/2012  . Pancreatitis    ~2008, 11/2011 post ERCP  . Pneumonia 12/2011   "first time I know about"  . Sepsis ([place]) 11/27/2013  . SIRS (systemic inflammatory response syndrome) ([place]) 03/13/2012   a/w post ERCP  pancreatitis.   . Syncope   . Type II diabetes mellitus [facility])     Assessment:  85 yo female with recent fall + resultant ankle fracture noted with EColi bacteremia. She has AFib and is on warfarin prior to admission.   INR is therapeutic at 2.48 (decreased from 2.86). CBC stable. No s/sx of bleeding. PO intake has improved.   Home warfarin regimen: 2.5mg /d except take 5mg  MWF  Goal of Therapy:  INR 2-3 Monitor platelets by anticoagulation protocol: Yes   Plan:  Warfarin 2.5 mg po x 1 tonight Monitor daily INR, CBC, clinical course, s/sx of bleed, PO intake, DDI   Thank  you for allowing [place] to participate in this patients care.   Jens Som, PharmD Please utilize Amion (under [place]) for appropriate number for your unit pharmacist. 11/02/2018 9:13 AM

## 2018-11-02 NOTE — Progress Notes (Signed)
Occupational Therapy Treatment Patient Details Name: [name] [name] MRN: [id number] DOB: Nov 24, 1929 Today's Date: 11/02/2018    History of present illness Patient 85 y/o female presenting to hospital 1/21 with fever, chills, and incontinence seconday to bacteremia/sepsis. Patient also has AMS due diagnosis and a UTI. PMH includes DMII, COPD, HTN, a fib, syncope, and carotid stenosis. Patient has hx of recent fall resulting in ankle/toe fx and is suppose to wear walking boot when out of house   OT comments  Pt progressing well.  Performed grooming with set up and dressing with set up to min assist. Pt with Sp02 in mid 90's on RA with activity. Ambulated in hallway with min assist and RW, chair closely following to encourage increased distance. Will continue to follow.  Follow Up Recommendations  SNF    Equipment Recommendations  None recommended by OT    Recommendations for Other Services      Precautions / Restrictions Precautions Precautions: Fall Required Braces or Orthoses: Other Brace(wears R walking boot, but not for household distances) Restrictions Weight Bearing Restrictions: No       Mobility Bed Mobility Overal bed mobility: Needs Assistance Bed Mobility: Supine to Sit     Supine to sit: Supervision     General bed mobility comments: supervision for lines, safety  Transfers Overall transfer level: Needs assistance Equipment used: Rolling walker (2 wheeled) Transfers: Sit to/from Stand Sit to Stand: Min assist         General transfer comment: steadying assist, mild posterior lean upon initially standing    Balance Overall balance assessment: Needs assistance   Sitting balance-Leahy Scale: Fair     Standing balance support: Bilateral upper extremity supported Standing balance-Leahy Scale: Poor Standing balance comment: reliant on BUE and RW to maintain standing balance                           ADL either performed or assessed with  clinical judgement   ADL Overall ADL's : Needs assistance/impaired     Grooming: Brushing hair;Sitting;Set up           Upper Body Dressing : Set up;Sitting   Lower Body Dressing: Minimal assistance;Sit to/from stand Lower Body Dressing Details (indicate cue type and reason): min assist for strap on back of crocs Toilet Transfer: Minimal assistance;Ambulation;RW;BSC   Toileting- Clothing Manipulation and Hygiene: Minimal assistance;Sit to/from stand       Functional mobility during ADLs: Minimal assistance;Rolling walker(chair following to increase distance)       Vision       Perception     Praxis      Cognition Arousal/Alertness: Awake/alert Behavior During Therapy: WFL for tasks assessed/performed Overall Cognitive Status: Within Functional Limits for tasks assessed                                          Exercises     Shoulder Instructions       General Comments      Pertinent Vitals/ Pain       Pain Assessment: No/denies pain  Home Living                                          Prior Functioning/Environment  Frequency  Min 2X/week        Progress Toward Goals  OT Goals(current goals can now be found in the care plan section)  Progress towards OT goals: Progressing toward goals  Acute Rehab OT Goals Patient Stated Goal: get stronger and return home OT Goal Formulation: With patient Time For Goal Achievement: 11/14/18 Potential to Achieve Goals: Good  Plan Discharge plan remains appropriate    Co-evaluation    PT/OT/SLP Co-Evaluation/Treatment: Yes Reason for Co-Treatment: For patient/therapist safety   OT goals addressed during session: ADL's and self-care      AM-PAC OT "6 Clicks" Daily Activity     Outcome Measure   Help from another person eating meals?: None Help from another person taking care of personal grooming?: A Little Help from another person toileting, which  includes using toliet, bedpan, or urinal?: A Little Help from another person bathing (including washing, rinsing, drying)?: A Little Help from another person to put on and taking off regular upper body clothing?: None Help from another person to put on and taking off regular lower body clothing?: A Little 6 Click Score: 20    End of Session Equipment Utilized During Treatment: Gait belt;Rolling walker  OT Visit Diagnosis: Unsteadiness on feet (R26.81);Other abnormalities of gait and mobility (R26.89);Muscle weakness (generalized) (M62.81);History of falling (Z91.81)   Activity Tolerance Patient tolerated treatment well   Patient Left in chair;with call bell/phone within reach;with chair alarm set   Nurse Communication Mobility status(needs pure wick replaced)        Time: 3818-4037 OT Time Calculation (min): 26 min  Charges: OT General Charges $OT Visit: 1 Visit OT Treatments $Self Care/Home Management : 8-22 mins  Nestor Lewandowsky, OTR/L Acute Rehabilitation Services Pager: [phone] Office: [phone]  [name] [name] 11/02/2018, 11:09 AM

## 2018-11-03 LAB — CBC WITH DIFFERENTIAL/PLATELET
Abs Immature Granulocytes: 0.5 10*3/uL — ABNORMAL HIGH (ref 0.00–0.07)
Basophils Absolute: 0.1 10*3/uL (ref 0.0–0.1)
Basophils Relative: 1 %
Eosinophils Absolute: 0.2 10*3/uL (ref 0.0–0.5)
Eosinophils Relative: 2 %
HCT: 38.1 % (ref 36.0–46.0)
Hemoglobin: 12.2 g/dL (ref 12.0–15.0)
Immature Granulocytes: 4 %
LYMPHS ABS: 3.1 10*3/uL (ref 0.7–4.0)
Lymphocytes Relative: 25 %
MCH: 29.6 pg (ref 26.0–34.0)
MCHC: 32 g/dL (ref 30.0–36.0)
MCV: 92.5 fL (ref 80.0–100.0)
Monocytes Absolute: 0.7 10*3/uL (ref 0.1–1.0)
Monocytes Relative: 5 %
Neutro Abs: 7.8 10*3/uL — ABNORMAL HIGH (ref 1.7–7.7)
Neutrophils Relative %: 63 %
Platelets: 420 10*3/uL — ABNORMAL HIGH (ref 150–400)
RBC: 4.12 MIL/uL (ref 3.87–5.11)
RDW: 13.8 % (ref 11.5–15.5)
WBC: 12.3 10*3/uL — ABNORMAL HIGH (ref 4.0–10.5)
nRBC: 0 % (ref 0.0–0.2)

## 2018-11-03 LAB — BASIC METABOLIC PANEL
Anion gap: 13 (ref 5–15)
BUN: 10 mg/dL (ref 8–23)
CO2: 24 mmol/L (ref 22–32)
Calcium: 9 mg/dL (ref 8.9–10.3)
Chloride: 100 mmol/L (ref 98–111)
Creatinine, Ser: 0.8 mg/dL (ref 0.44–1.00)
GFR calc Af Amer: >60 mL/min (ref >60)
GFR calc non Af Amer: >60 mL/min (ref >60)
Glucose, Bld: 184 mg/dL — ABNORMAL HIGH (ref 70–99)
Potassium: 3.6 mmol/L (ref 3.5–5.1)
Sodium: 137 mmol/L (ref 135–145)

## 2018-11-03 LAB — GLUCOSE, CAPILLARY
GLUCOSE-CAPILLARY: 172 mg/dL — AB (ref 70–99)
GLUCOSE-CAPILLARY: 216 mg/dL — AB (ref 70–99)
Glucose-Capillary: 207 mg/dL — ABNORMAL HIGH (ref 70–99)
Glucose-Capillary: 170 mg/dL — ABNORMAL HIGH (ref 70–99)

## 2018-11-03 LAB — PROTIME-INR
INR: 2.26
Prothrombin Time: 24.6 seconds — ABNORMAL HIGH (ref 11.4–15.2)

## 2018-11-03 MED ORDER — ROPINIROLE HCL 1 MG PO TABS
0.5000 mg | ORAL_TABLET | Freq: Once | ORAL | Status: AC
  Administered 2018-11-03: 0.5 mg via ORAL
  Filled 2018-11-03: qty 1

## 2018-11-03 MED ORDER — WARFARIN SODIUM 2.5 MG PO TABS
2.5000 mg | ORAL_TABLET | Freq: Once | ORAL | Status: AC
  Administered 2018-11-03: 2.5 mg via ORAL
  Filled 2018-11-03: qty 1

## 2018-11-03 NOTE — Progress Notes (Signed)
PROGRESS NOTE    [name] [name]  ZOX:[id number] DOB: Mar 30, 1930 DOA: 10/25/2018 PCP: [name], MD   Brief Narrative: Patient is a 85 year old female with past medical history of chronic A. fib on warfarin, carotid stenosis, COPD, GERD, hypertension, type 2 diabetes mellitus who presented with fever, chills, dysuria and incontinence.  Work-up revealed UTI sepsis secondary to E. coli.  Urine culture also revealed Enterococcus faecalis.  Was treated initially with IV Rocephin ,changd to Keflex.  She also got a dose of IV fosfomycin for enterococcus UTI.  She is waiting for skilled nursing facility bed.  Assessment & Plan:   Principal Problem:   Bacteremia Active Problems:   UTI (urinary tract infection)   DM II (diabetes mellitus, type II), controlled (HCC)   HTN (hypertension)   Atrial fibrillation    Anxiety   COPD (chronic obstructive pulmonary disease) (HCC)   DM neuropathy, type II diabetes mellitus (HCC)   SIRS (systemic inflammatory response syndrome) (HCC)  Gram-negative bacteremia/sepsis present on admission: Presented with fever, tachycardia, lactic acidosis.  She was confused on presentation. UA was abnormal. Blood cultures showed pansensitive E. coli.  She was treated with ceftriaxone.  Changed to Keflex. She has completed antibiotic course.Hemodynamically stable.   Urinary tract infection: Urine cultures were Enterococcus faecalis.  She received a dose of fosfomycin  Altered mental status: Metabolic encephalopathy due to sepsis.  Mental status has improved and is on baseline.  Currently she is alert and oriented.  Chronic A. fib with RVR: Heart rate better. She was given Cardizem IV push and was started on Cardizem drip on presentation.  She is on oral Cardizem and metoprolol at home which we will continue  On warfarin for anticoagulation.  Continue monitoring INR daily.  Dysphagia: She has history of dysphagia.  She follows with GI as an outpatient.  Speech  therapy already evaluated her and recommended dysphagia 3 diet.  COPD: Currently stable.  Continue inhalers and bronchodilators.  Type 2 diabetes mellitus: On insulin at home.  Sliding scale insulin will be continued.  Hypertension: Currently blood pressure stable.  We will continue to monitor.  Deconditioning/debility/recent foot fracture: Physical therapy will evaluated her and recommended skilled nursing facility on discharge.  Waiting for authorization.          DVT prophylaxis: warfarin Code Status: Full Family Communication: None present at the bedside Disposition Plan: Skilled nursing facility as soon as the bed is available   Consultants: None  Procedures: None  Antimicrobials:  Anti-infectives (From admission, onward)   Start     Dose/Rate Route Frequency Ordered Stop   10/31/18 1400  cephALEXin (KEFLEX) capsule 250 mg     250 mg Oral Every 8 hours 10/31/18 1315 11/03/18 0611   10/27/18 0800  cefTRIAXone (ROCEPHIN) 1 g in sodium chloride 0.9 % 100 mL IVPB  Status:  Discontinued     1 g 200 mL/hr over 30 Minutes Intravenous Every 24 hours 10/26/18 0400 10/26/18 0937   10/27/18 0800  cefTRIAXone (ROCEPHIN) 2 g in sodium chloride 0.9 % 100 mL IVPB  Status:  Discontinued     2 g 200 mL/hr over 30 Minutes Intravenous Every 24 hours 10/26/18 0937 10/31/18 1315   10/26/18 1400  cefTRIAXone (ROCEPHIN) 1 g in sodium chloride 0.9 % 100 mL IVPB  Status:  Discontinued     1 g 200 mL/hr over 30 Minutes Intravenous Every 24 hours 10/25/18 1556 10/25/18 1625   10/26/18 1400  cefTRIAXone (ROCEPHIN) 1 g in sodium chloride 0.9 %  100 mL IVPB  Status:  Discontinued     1 g 200 mL/hr over 30 Minutes Intravenous Every 24 hours 10/25/18 1625 10/26/18 0400   10/26/18 0430  cefTRIAXone (ROCEPHIN) 2 g in sodium chloride 0.9 % 100 mL IVPB     2 g 200 mL/hr over 30 Minutes Intravenous  Once 10/26/18 0400 10/26/18 0757   10/25/18 1630  cefTRIAXone (ROCEPHIN) 1 g in sodium chloride 0.9  % 100 mL IVPB  Status:  Discontinued     1 g 200 mL/hr over 30 Minutes Intravenous  Once 10/25/18 1625 10/25/18 1643   10/25/18 1500  cefTRIAXone (ROCEPHIN) 1 g in sodium chloride 0.9 % 100 mL IVPB     1 g 200 mL/hr over 30 Minutes Intravenous  Once 10/25/18 1457 10/25/18 1604      Subjective: Patient seen and examined the bedside this morning.  Hemodynamically stable.  Complains of some nausea this morning and was given Zofran  Objective: Vitals:   11/02/18 1954 11/02/18 2348 11/03/18 0500 11/03/18 0605  BP:  (!) 129/56 (!) 155/70 (!) 155/70  Pulse:  (!) 110    Resp:  18 20 (!) 21  Temp:  97.6 F (36.4 C) 98.3 F (36.8 C)   TempSrc:  Oral Oral   SpO2: 95% 93% 93% 94%  Weight:      Height:        Intake/Output Summary (Last 24 hours) at 11/03/2018 1249 Last data filed at 11/03/2018 5885 Gross per 24 hour  Intake 120 ml  Output 1100 ml  Net -980 ml   Filed Weights   10/25/18 2015 10/29/18 0451 10/31/18 0431  Weight: 63.3 kg 67.2 kg 64.6 kg    Examination:  General exam: Appears calm and comfortable ,Not in distress,average built, elderly female HEENT:PERRL,Oral mucosa moist, Ear/Nose normal on gross exam Respiratory system: Bilateral equal air entry, normal vesicular breath sounds, no wheezes or crackles  Cardiovascular system: A. fib, No JVD, murmurs, rubs, gallops or clicks. Gastrointestinal system: Abdomen is nondistended, soft and nontender. No organomegaly or masses felt. Normal bowel sounds heard. Central nervous system: Alert and oriented. No focal neurological deficits. Extremities: No edema, no clubbing ,no cyanosis, distal peripheral pulses palpable. Skin: No rashes, lesions or ulcers,no icterus ,no pallor MSK: Normal muscle bulk,tone ,power Psychiatry: Judgement and insight appear normal. Mood & affect appropriate.      Data Reviewed: I have personally reviewed following labs and imaging studies  CBC: Recent Labs  Lab 10/29/18 1001 10/30/18 0500  11/03/18 0334  WBC 10.1 10.5 12.3*  NEUTROABS 6.9 7.2 7.8*  HGB 11.5* 11.0* 12.2  HCT 34.7* 33.8* 38.1  MCV 92.8 92.3 92.5  PLT 219 235 027*   Basic Metabolic Panel: Recent Labs  Lab 10/29/18 1001 10/30/18 0500 10/31/18 0441 11/03/18 0334  NA 138 139 137 137  K 3.7 2.9* 4.4 3.6  CL 109 103 101 100  CO2 20* 24 25 24   GLUCOSE 198* 140* 147* 184*  BUN 6* 5* 8 10  CREATININE 0.78 0.72 0.75 0.80  CALCIUM 8.4* 8.6* 8.6* 9.0  MG 1.4* 1.4* 1.8  --   PHOS 2.6 2.6 2.7  --    GFR: Estimated Creatinine Clearance: 42.3 mL/min (by C-G formula based on SCr of 0.8 mg/dL). Liver Function Tests: Recent Labs  Lab 10/29/18 1001 10/30/18 0500 10/31/18 0441  ALBUMIN 2.5* 2.6* 2.4*   No results for input(s): LIPASE, AMYLASE in the last 168 hours. No results for input(s): AMMONIA in the last 168 hours.  Coagulation Profile: Recent Labs  Lab 10/30/18 0500 10/31/18 0441 11/01/18 0309 11/02/18 0600 11/03/18 0334  INR 2.60 3.03 2.86 2.48 2.26   Cardiac Enzymes: No results for input(s): CKTOTAL, CKMB, CKMBINDEX, TROPONINI in the last 168 hours. BNP (last 3 results) No results for input(s): PROBNP in the last 8760 hours. HbA1C: No results for input(s): HGBA1C in the last 72 hours. CBG: Recent Labs  Lab 11/02/18 1138 11/02/18 1626 11/02/18 2131 11/03/18 0607 11/03/18 1129  GLUCAP 174* 199* 163* 207* 170*   Lipid Profile: No results for input(s): CHOL, HDL, LDLCALC, TRIG, CHOLHDL, LDLDIRECT in the last 72 hours. Thyroid Function Tests: No results for input(s): TSH, T4TOTAL, FREET4, T3FREE, THYROIDAB in the last 72 hours. Anemia Panel: No results for input(s): VITAMINB12, FOLATE, FERRITIN, TIBC, IRON, RETICCTPCT in the last 72 hours. Sepsis Labs: No results for input(s): PROCALCITON, LATICACIDVEN in the last 168 hours.  Recent Results (from the past 240 hour(s))  Culture, blood (routine x 2)     Status: Abnormal   Collection Time: 10/25/18 12:50 PM  Result Value Ref Range  Status   Specimen Description BLOOD BLOOD RIGHT HAND  Final   Special Requests   Final    BOTTLES DRAWN AEROBIC AND ANAEROBIC Blood Culture adequate volume   Culture  Setup Time   Final    GRAM NEGATIVE RODS AEROBIC BOTTLE ONLY CRITICAL VALUE NOTED.  VALUE IS CONSISTENT WITH PREVIOUSLY REPORTED AND CALLED VALUE.    Culture ESCHERICHIA COLI (A)  Final   Report Status 10/28/2018 FINAL  Final   Organism ID, Bacteria ESCHERICHIA COLI  Final      Susceptibility   Escherichia coli - MIC*    AMPICILLIN >=32 RESISTANT Resistant     CEFAZOLIN 16 SENSITIVE Sensitive     CEFEPIME <=1 SENSITIVE Sensitive     CEFTAZIDIME <=1 SENSITIVE Sensitive     CEFTRIAXONE <=1 SENSITIVE Sensitive     CIPROFLOXACIN <=0.25 SENSITIVE Sensitive     GENTAMICIN <=1 SENSITIVE Sensitive     IMIPENEM <=0.25 SENSITIVE Sensitive     TRIMETH/SULFA <=20 SENSITIVE Sensitive     AMPICILLIN/SULBACTAM >=32 RESISTANT Resistant     PIP/TAZO <=4 SENSITIVE Sensitive     Extended ESBL NEGATIVE Sensitive     * ESCHERICHIA COLI  Culture, blood (routine x 2)     Status: Abnormal   Collection Time: 10/25/18 12:55 PM  Result Value Ref Range Status   Specimen Description BLOOD BLOOD LEFT HAND  Final   Special Requests   Final    BOTTLES DRAWN AEROBIC AND ANAEROBIC Blood Culture adequate volume   Culture  Setup Time   Final    GRAM NEGATIVE RODS ANAEROBIC BOTTLE ONLY CRITICAL RESULT CALLED TO, READ BACK BY AND VERIFIED WITH: Denton Brick PHARMD [id number] 10/26/2018 A BROWNING    Culture (A)  Final    ESCHERICHIA COLI SUSCEPTIBILITIES PERFORMED ON PREVIOUS CULTURE WITHIN THE LAST 5 DAYS. Performed at [facility], [place] [address], [place], [place] [address]    Report Status 10/28/2018 FINAL  Final  Blood Culture ID Panel (Reflexed)     Status: Abnormal   Collection Time: 10/25/18 12:55 PM  Result Value Ref Range Status   Enterococcus species NOT DETECTED NOT DETECTED Final   Listeria monocytogenes NOT DETECTED NOT DETECTED Final    Staphylococcus species NOT DETECTED NOT DETECTED Final   Staphylococcus aureus (BCID) NOT DETECTED NOT DETECTED Final   Streptococcus species NOT DETECTED NOT DETECTED Final   Streptococcus agalactiae NOT DETECTED NOT DETECTED  Final   Streptococcus pneumoniae NOT DETECTED NOT DETECTED Final   Streptococcus pyogenes NOT DETECTED NOT DETECTED Final   Acinetobacter baumannii NOT DETECTED NOT DETECTED Final   Enterobacteriaceae species DETECTED (A) NOT DETECTED Final    Comment: Enterobacteriaceae represent a large family of gram-negative bacteria, not a single organism. CRITICAL RESULT CALLED TO, READ BACK BY AND VERIFIED WITH: Denton Brick PHARMD [id number] 10/26/2018 A BROWNING    Enterobacter cloacae complex NOT DETECTED NOT DETECTED Final   Escherichia coli DETECTED (A) NOT DETECTED Final    Comment: CRITICAL RESULT CALLED TO, READ BACK BY AND VERIFIED WITH: Denton Brick PHARMD [id number] 10/26/2018 A BROWNING    Klebsiella oxytoca NOT DETECTED NOT DETECTED Final   Klebsiella pneumoniae NOT DETECTED NOT DETECTED Final   Proteus species NOT DETECTED NOT DETECTED Final   Serratia marcescens NOT DETECTED NOT DETECTED Final   Carbapenem resistance NOT DETECTED NOT DETECTED Final   Haemophilus influenzae NOT DETECTED NOT DETECTED Final   Neisseria meningitidis NOT DETECTED NOT DETECTED Final   Pseudomonas aeruginosa NOT DETECTED NOT DETECTED Final   Candida albicans NOT DETECTED NOT DETECTED Final   Candida glabrata NOT DETECTED NOT DETECTED Final   Candida krusei NOT DETECTED NOT DETECTED Final   Candida parapsilosis NOT DETECTED NOT DETECTED Final   Candida tropicalis NOT DETECTED NOT DETECTED Final    Comment: Performed at [facility], [place] [address] [place], [place] [address]  Culture, Urine     Status: Abnormal   Collection Time: 10/25/18  3:57 PM  Result Value Ref Range Status   Specimen Description URINE, RANDOM  Final   Special Requests   Final    NONE Performed at [place], [place] [address], [place], [place] [address]    Culture MULTIPLE SPECIES PRESENT, SUGGEST RECOLLECTION (A)  Final   Report Status 10/27/2018 FINAL  Final  Culture, Urine     Status: Abnormal   Collection Time: 10/29/18  9:57 AM  Result Value Ref Range Status   Specimen Description URINE, CATHETERIZED  Final   Special Requests   Final    NONE Performed at [facility], [place] [address] [place], [place] [address]    Culture 40,000 COLONIES/mL ENTEROCOCCUS FAECALIS (A)  Final   Report Status 10/31/2018 FINAL  Final   Organism ID, Bacteria ENTEROCOCCUS FAECALIS (A)  Final      Susceptibility   Enterococcus faecalis - MIC*    AMPICILLIN <=2 SENSITIVE Sensitive     LEVOFLOXACIN >=8 RESISTANT Resistant     NITROFURANTOIN <=16 SENSITIVE Sensitive     VANCOMYCIN 1 SENSITIVE Sensitive     * 40,000 COLONIES/mL ENTEROCOCCUS FAECALIS  Culture, blood (routine x 2)     Status: None (Preliminary result)   Collection Time: 10/30/18  2:34 PM  Result Value Ref Range Status   Specimen Description BLOOD LEFT ANTECUBITAL  Final   Special Requests   Final    BOTTLES DRAWN AEROBIC ONLY Blood Culture adequate volume   Culture   Final    NO GROWTH 4 DAYS Performed at [facility], [place] [address], [place], [place] [address]    Report Status PENDING  Incomplete  Culture, blood (routine x 2)     Status: None (Preliminary result)   Collection Time: 10/30/18  2:40 PM  Result Value Ref Range Status   Specimen Description BLOOD LEFT HAND  Final   Special Requests   Final    BOTTLES DRAWN AEROBIC ONLY Blood Culture  adequate volume   Culture   Final    NO GROWTH 4 DAYS Performed at [facility], [place] [address] [place], [place] [address]    Report Status PENDING  Incomplete         Radiology Studies: No results found.      Scheduled Meds: . diltiazem  120 mg Oral Daily  . gabapentin  300 mg Oral Daily  . insulin aspart  0-5 Units Subcutaneous QHS  . insulin aspart  0-9 Units  Subcutaneous TID WC  . latanoprost  1 drop Both Eyes QHS  . metoprolol tartrate  12.5 mg Oral BID  . mirtazapine  7.5 mg Oral QHS  . mometasone-formoterol  2 puff Inhalation BID  . multivitamin with minerals  1 tablet Oral Daily  . oxybutynin  5 mg Oral QHS  . pantoprazole  40 mg Oral BID  . warfarin  2.5 mg Oral ONCE-1800  . Warfarin - Pharmacist Dosing Inpatient   Does not apply q1800   Continuous Infusions:   LOS: 8 days    Time spent: 35 mins.More than 50% of that time was spent in counseling and/or coordination of care.      Shelly Coss, MD [facility] Pager [phone]  If 7PM-7AM, please contact night-coverage [email or web address] Password [facility] 11/03/2018, 12:49 PM

## 2018-11-03 NOTE — Progress Notes (Signed)
CSW contacted [place] remains pending- CSW updated the patient, family and Therapist, sports.   Thurmond Butts, [place] Social Worker [phone]

## 2018-11-03 NOTE — Progress Notes (Signed)
[place] for warfarin Indication: atrial fibrillation  Patient Measurements: Height: 5' (152.4 cm) Weight: 142 lb 6.7 oz (64.6 kg) IBW/kg (Calculated) : 45.5   Vital Signs: Temp: 98.3 F (36.8 C) (01/30 0500) Temp Source: Oral (01/30 0500) BP: 155/70 (01/30 0605) Pulse Rate: 110 (01/29 2348)  Labs: Recent Labs    11/01/18 0309 11/02/18 0600 11/03/18 0334  HGB  --   --  12.2  HCT  --   --  38.1  PLT  --   --  420*  LABPROT 29.6* 26.5* 24.6*  INR 2.86 2.48 2.26  CREATININE  --   --  0.80     Medical History: Past Medical History:  Diagnosis Date  . Atrial fibrillation (HCC)    RVR hx.  Chronic Coumadin  . Bacteremia 10/2018  . C. difficile colitis 09/21/2013, 11/2013  . Carotid stenosis    a. Carotid US (10/2013):  bilat 1-39%; f/u 1 year  . Chronic lower back pain   . COPD (chronic obstructive pulmonary disease) ([place])   . Depression   . Diverticulosis of colon without hemorrhage   . GERD (gastroesophageal reflux disease)    with HH  . Glaucoma   . HCAP (healthcare-associated pneumonia) 11/21/2011  . High cholesterol   . Hypertension   . Metabolic encephalopathy 10/11/107  . Mitral regurgitation 03/11/2012  . Pancreatitis    ~2008, 11/2011 post ERCP  . Pneumonia 12/2011   "first time I know about"  . Sepsis ([place]) 11/27/2013  . SIRS (systemic inflammatory response syndrome) ([place]) 03/13/2012   a/w post ERCP  pancreatitis.   . Syncope   . Type II diabetes mellitus [facility])     Assessment:  85 yo female with recent fall + resultant ankle fracture noted with EColi bacteremia. She has AFib and is on warfarin prior to admission.   INR is therapeutic at 2.26 (decreased from 2.48, however given lower dose of 1mg  on 1/27 which may be contributing). CBC stable. No s/sx of bleeding. PO intake varies 50-100% per meal but improved overall. Remains on Keflex.    Home warfarin regimen: 2.5mg /d except take 5mg  MWF  Goal of Therapy:  INR  2-3 Monitor platelets by anticoagulation protocol: Yes   Plan:  Warfarin 2.5 mg po x 1 again tonight Monitor daily INR, CBC, clinical course, s/sx of bleed, PO intake, DDI   Thank you for allowing [place] to participate in this patients care.   Sloan Leiter, PharmD, BCPS, BCCCP Clinical Pharmacist Please refer to [facility] for [place] numbers 11/03/2018 11:30 AM

## 2018-11-04 DIAGNOSIS — R110 Nausea: Secondary | ICD-10-CM

## 2018-11-04 DIAGNOSIS — R197 Diarrhea, unspecified: Secondary | ICD-10-CM

## 2018-11-04 LAB — GLUCOSE, CAPILLARY
Glucose-Capillary: 194 mg/dL — ABNORMAL HIGH (ref 70–99)
Glucose-Capillary: 246 mg/dL — ABNORMAL HIGH (ref 70–99)
Glucose-Capillary: 146 mg/dL — ABNORMAL HIGH (ref 70–99)
Glucose-Capillary: 208 mg/dL — ABNORMAL HIGH (ref 70–99)

## 2018-11-04 LAB — CULTURE, BLOOD (ROUTINE X 2)
Culture: NO GROWTH
Culture: NO GROWTH
Special Requests: ADEQUATE
Special Requests: ADEQUATE

## 2018-11-04 LAB — PROTIME-INR
INR: 2
Prothrombin Time: 22.4 seconds — ABNORMAL HIGH (ref 11.4–15.2)

## 2018-11-04 MED ORDER — WARFARIN SODIUM 5 MG PO TABS
5.0000 mg | ORAL_TABLET | Freq: Once | ORAL | Status: AC
  Administered 2018-11-04: 5 mg via ORAL
  Filled 2018-11-04: qty 1

## 2018-11-04 MED ORDER — HYDROCORTISONE 1 % EX CREA
TOPICAL_CREAM | Freq: Four times a day (QID) | CUTANEOUS | Status: DC | PRN
  Filled 2018-11-04: qty 28

## 2018-11-04 MED ORDER — HYDROCORTISONE 0.5 % EX CREA
TOPICAL_CREAM | Freq: Four times a day (QID) | CUTANEOUS | Status: DC | PRN
  Filled 2018-11-04: qty 28.35

## 2018-11-04 NOTE — Progress Notes (Signed)
PROGRESS NOTE    [name] [name]  QIW:[id number] DOB: [birth year] DOA: 10/25/2018 PCP: [name], MD   Brief Narrative: Patient is a 85 year old female with past medical history of chronic A. fib on warfarin, carotid stenosis, COPD, GERD, hypertension, type 2 diabetes mellitus who presented with fever, chills, dysuria and incontinence.  Work-up revealed UTI sepsis secondary to E. coli.  Urine culture also revealed Enterococcus faecalis.  Was treated initially with IV Rocephin ,changedd to Keflex.  She also got a dose of IV fosfomycin for enterococcus UTI.  She has completed the antibiotics course.She is waiting for skilled nursing facility bed.  Assessment & Plan:   Principal Problem:   Bacteremia Active Problems:   UTI (urinary tract infection)   DM II (diabetes mellitus, type II), controlled (HCC)   HTN (hypertension)   Atrial fibrillation    Anxiety   COPD (chronic obstructive pulmonary disease) (HCC)   DM neuropathy, type II diabetes mellitus (HCC)   SIRS (systemic inflammatory response syndrome) (HCC)  Gram-negative bacteremia/sepsis present on admission: Presented with fever, tachycardia, lactic acidosis.  She was confused on presentation. UA was abnormal. Blood cultures showed pansensitive E. coli.  She was treated with ceftriaxone.  Changed to Keflex. She has completed antibiotic course.Hemodynamically stable.   Urinary tract infection: Urine cultures were Enterococcus faecalis.  She received a dose of fosfomycin  Altered mental status: Metabolic encephalopathy due to sepsis.  Mental status has improved and is on baseline.  Currently she is alert and oriented.  Chronic A. fib with RVR: Heart rate better. She was given Cardizem IV push and was started on Cardizem drip on presentation.  She is on oral Cardizem and metoprolol at home which we will continue  On warfarin for anticoagulation.  Continue monitoring INR daily.  Dysphagia: She has history of dysphagia.  She  follows with GI as an outpatient.  Speech therapy already evaluated her and recommended dysphagia 3 diet.  COPD: Currently stable.  Continue inhalers and bronchodilators.  Type 2 diabetes mellitus: On insulin at home.  Sliding scale insulin will be continued.  Hypertension: Currently blood pressure stable.  We will continue to monitor.  Nausea/diarrhea: Continue Zofran, Imodium  Deconditioning/debility/recent foot fracture: Physical therapy will evaluated her and recommended skilled nursing facility on discharge.  Waiting for authorization.          DVT prophylaxis: warfarin Code Status: Full Family Communication: None present at the bedside Disposition Plan: Skilled nursing facility as soon as the bed is available   Consultants: None  Procedures: None  Antimicrobials:  Anti-infectives (From admission, onward)   Start     Dose/Rate Route Frequency Ordered Stop   10/31/18 1400  cephALEXin (KEFLEX) capsule 250 mg     250 mg Oral Every 8 hours 10/31/18 1315 11/03/18 0611   10/27/18 0800  cefTRIAXone (ROCEPHIN) 1 g in sodium chloride 0.9 % 100 mL IVPB  Status:  Discontinued     1 g 200 mL/hr over 30 Minutes Intravenous Every 24 hours 10/26/18 0400 10/26/18 0937   10/27/18 0800  cefTRIAXone (ROCEPHIN) 2 g in sodium chloride 0.9 % 100 mL IVPB  Status:  Discontinued     2 g 200 mL/hr over 30 Minutes Intravenous Every 24 hours 10/26/18 0937 10/31/18 1315   10/26/18 1400  cefTRIAXone (ROCEPHIN) 1 g in sodium chloride 0.9 % 100 mL IVPB  Status:  Discontinued     1 g 200 mL/hr over 30 Minutes Intravenous Every 24 hours 10/25/18 1556 10/25/18 1625   10/26/18 1400  cefTRIAXone (ROCEPHIN) 1 g in sodium chloride 0.9 % 100 mL IVPB  Status:  Discontinued     1 g 200 mL/hr over 30 Minutes Intravenous Every 24 hours 10/25/18 1625 10/26/18 0400   10/26/18 0430  cefTRIAXone (ROCEPHIN) 2 g in sodium chloride 0.9 % 100 mL IVPB     2 g 200 mL/hr over 30 Minutes Intravenous  Once 10/26/18  0400 10/26/18 0757   10/25/18 1630  cefTRIAXone (ROCEPHIN) 1 g in sodium chloride 0.9 % 100 mL IVPB  Status:  Discontinued     1 g 200 mL/hr over 30 Minutes Intravenous  Once 10/25/18 1625 10/25/18 1643   10/25/18 1500  cefTRIAXone (ROCEPHIN) 1 g in sodium chloride 0.9 % 100 mL IVPB     1 g 200 mL/hr over 30 Minutes Intravenous  Once 10/25/18 1457 10/25/18 1604      Subjective: Patient seen and examined the bedside this morning.  Hemodynamically stable.  Complained of 2 episodes of loose stools today and was asking for Imodium.  Nausea has improved  Objective: Vitals:   11/04/18 0449 11/04/18 0809 11/04/18 0812 11/04/18 1146  BP: 121/65  133/66 (!) 127/58  Pulse:  91  93  Resp: 18 (!) 23 14 (!) 21  Temp: 98 F (36.7 C)  97.7 F (36.5 C) 97.6 F (36.4 C)  TempSrc: Oral  Oral Oral  SpO2: 95% 94% 96% 96%  Weight:      Height:        Intake/Output Summary (Last 24 hours) at 11/04/2018 1154 Last data filed at 11/04/2018 0717 Gross per 24 hour  Intake -  Output 1900 ml  Net -1900 ml   Filed Weights   10/25/18 2015 10/29/18 0451 10/31/18 0431  Weight: 63.3 kg 67.2 kg 64.6 kg    Examination:   General exam: Appears calm and comfortable ,Not in distress,average built, elderly female HEENT:PERRL,Oral mucosa moist, Ear/Nose normal on gross exam Respiratory system: Bilateral equal air entry, normal vesicular breath sounds, no wheezes or crackles  Cardiovascular system: A. fib, no JVD, murmurs, rubs, gallops or clicks. Gastrointestinal system: Abdomen is nondistended, soft and nontender. No organomegaly or masses felt. Normal bowel sounds heard. Central nervous system: Alert and oriented. No focal neurological deficits. Extremities: No edema, no clubbing ,no cyanosis, distal peripheral pulses palpable. Skin: No rashes, lesions or ulcers,no icterus ,no pallor   Data Reviewed: I have personally reviewed following labs and imaging studies  CBC: Recent Labs  Lab 10/29/18 1001  10/30/18 0500 11/03/18 0334  WBC 10.1 10.5 12.3*  NEUTROABS 6.9 7.2 7.8*  HGB 11.5* 11.0* 12.2  HCT 34.7* 33.8* 38.1  MCV 92.8 92.3 92.5  PLT 219 235 644*   Basic Metabolic Panel: Recent Labs  Lab 10/29/18 1001 10/30/18 0500 10/31/18 0441 11/03/18 0334  NA 138 139 137 137  K 3.7 2.9* 4.4 3.6  CL 109 103 101 100  CO2 20* 24 25 24   GLUCOSE 198* 140* 147* 184*  BUN 6* 5* 8 10  CREATININE 0.78 0.72 0.75 0.80  CALCIUM 8.4* 8.6* 8.6* 9.0  MG 1.4* 1.4* 1.8  --   PHOS 2.6 2.6 2.7  --    GFR: Estimated Creatinine Clearance: 42.3 mL/min (by C-G formula based on SCr of 0.8 mg/dL). Liver Function Tests: Recent Labs  Lab 10/29/18 1001 10/30/18 0500 10/31/18 0441  ALBUMIN 2.5* 2.6* 2.4*   No results for input(s): LIPASE, AMYLASE in the last 168 hours. No results for input(s): AMMONIA in the last 168 hours. Coagulation  Profile: Recent Labs  Lab 10/31/18 0441 11/01/18 0309 11/02/18 0600 11/03/18 0334 11/04/18 0513  INR 3.03 2.86 2.48 2.26 2.00   Cardiac Enzymes: No results for input(s): CKTOTAL, CKMB, CKMBINDEX, TROPONINI in the last 168 hours. BNP (last 3 results) No results for input(s): PROBNP in the last 8760 hours. HbA1C: No results for input(s): HGBA1C in the last 72 hours. CBG: Recent Labs  Lab 11/03/18 1129 11/03/18 1611 11/03/18 2029 11/04/18 0624 11/04/18 1104  GLUCAP 170* 172* 216* 194* 246*   Lipid Profile: No results for input(s): CHOL, HDL, LDLCALC, TRIG, CHOLHDL, LDLDIRECT in the last 72 hours. Thyroid Function Tests: No results for input(s): TSH, T4TOTAL, FREET4, T3FREE, THYROIDAB in the last 72 hours. Anemia Panel: No results for input(s): VITAMINB12, FOLATE, FERRITIN, TIBC, IRON, RETICCTPCT in the last 72 hours. Sepsis Labs: No results for input(s): PROCALCITON, LATICACIDVEN in the last 168 hours.  Recent Results (from the past 240 hour(s))  Culture, blood (routine x 2)     Status: Abnormal   Collection Time: 10/25/18 12:50 PM  Result  Value Ref Range Status   Specimen Description BLOOD BLOOD RIGHT HAND  Final   Special Requests   Final    BOTTLES DRAWN AEROBIC AND ANAEROBIC Blood Culture adequate volume   Culture  Setup Time   Final    GRAM NEGATIVE RODS AEROBIC BOTTLE ONLY CRITICAL VALUE NOTED.  VALUE IS CONSISTENT WITH PREVIOUSLY REPORTED AND CALLED VALUE.    Culture ESCHERICHIA COLI (A)  Final   Report Status 10/28/2018 FINAL  Final   Organism ID, Bacteria ESCHERICHIA COLI  Final      Susceptibility   Escherichia coli - MIC*    AMPICILLIN >=32 RESISTANT Resistant     CEFAZOLIN 16 SENSITIVE Sensitive     CEFEPIME <=1 SENSITIVE Sensitive     CEFTAZIDIME <=1 SENSITIVE Sensitive     CEFTRIAXONE <=1 SENSITIVE Sensitive     CIPROFLOXACIN <=0.25 SENSITIVE Sensitive     GENTAMICIN <=1 SENSITIVE Sensitive     IMIPENEM <=0.25 SENSITIVE Sensitive     TRIMETH/SULFA <=20 SENSITIVE Sensitive     AMPICILLIN/SULBACTAM >=32 RESISTANT Resistant     PIP/TAZO <=4 SENSITIVE Sensitive     Extended ESBL NEGATIVE Sensitive     * ESCHERICHIA COLI  Culture, blood (routine x 2)     Status: Abnormal   Collection Time: 10/25/18 12:55 PM  Result Value Ref Range Status   Specimen Description BLOOD BLOOD LEFT HAND  Final   Special Requests   Final    BOTTLES DRAWN AEROBIC AND ANAEROBIC Blood Culture adequate volume   Culture  Setup Time   Final    GRAM NEGATIVE RODS ANAEROBIC BOTTLE ONLY CRITICAL RESULT CALLED TO, READ BACK BY AND VERIFIED WITH: Denton Brick PHARMD [id number] 10/26/2018 A BROWNING    Culture (A)  Final    ESCHERICHIA COLI SUSCEPTIBILITIES PERFORMED ON PREVIOUS CULTURE WITHIN THE LAST 5 DAYS. Performed at [facility], [place] [address] [place], [place] [address]    Report Status 10/28/2018 FINAL  Final  Blood Culture ID Panel (Reflexed)     Status: Abnormal   Collection Time: 10/25/18 12:55 PM  Result Value Ref Range Status   Enterococcus species NOT DETECTED NOT DETECTED Final   Listeria monocytogenes NOT DETECTED  NOT DETECTED Final   Staphylococcus species NOT DETECTED NOT DETECTED Final   Staphylococcus aureus (BCID) NOT DETECTED NOT DETECTED Final   Streptococcus species NOT DETECTED NOT DETECTED Final   Streptococcus agalactiae NOT DETECTED NOT DETECTED Final  Streptococcus pneumoniae NOT DETECTED NOT DETECTED Final   Streptococcus pyogenes NOT DETECTED NOT DETECTED Final   Acinetobacter baumannii NOT DETECTED NOT DETECTED Final   Enterobacteriaceae species DETECTED (A) NOT DETECTED Final    Comment: Enterobacteriaceae represent a large family of gram-negative bacteria, not a single organism. CRITICAL RESULT CALLED TO, READ BACK BY AND VERIFIED WITH: Denton Brick PHARMD [id number] 10/26/2018 A BROWNING    Enterobacter cloacae complex NOT DETECTED NOT DETECTED Final   Escherichia coli DETECTED (A) NOT DETECTED Final    Comment: CRITICAL RESULT CALLED TO, READ BACK BY AND VERIFIED WITH: Denton Brick PHARMD [id number] 10/26/2018 A BROWNING    Klebsiella oxytoca NOT DETECTED NOT DETECTED Final   Klebsiella pneumoniae NOT DETECTED NOT DETECTED Final   Proteus species NOT DETECTED NOT DETECTED Final   Serratia marcescens NOT DETECTED NOT DETECTED Final   Carbapenem resistance NOT DETECTED NOT DETECTED Final   Haemophilus influenzae NOT DETECTED NOT DETECTED Final   Neisseria meningitidis NOT DETECTED NOT DETECTED Final   Pseudomonas aeruginosa NOT DETECTED NOT DETECTED Final   Candida albicans NOT DETECTED NOT DETECTED Final   Candida glabrata NOT DETECTED NOT DETECTED Final   Candida krusei NOT DETECTED NOT DETECTED Final   Candida parapsilosis NOT DETECTED NOT DETECTED Final   Candida tropicalis NOT DETECTED NOT DETECTED Final    Comment: Performed at [facility], [place] [address], [place], [place] [address]  Culture, Urine     Status: Abnormal   Collection Time: 10/25/18  3:57 PM  Result Value Ref Range Status   Specimen Description URINE, RANDOM  Final   Special Requests   Final    NONE Performed at  [facility], [place] [address], [place], [place] [address]    Culture MULTIPLE SPECIES PRESENT, SUGGEST RECOLLECTION (A)  Final   Report Status 10/27/2018 FINAL  Final  Culture, Urine     Status: Abnormal   Collection Time: 10/29/18  9:57 AM  Result Value Ref Range Status   Specimen Description URINE, CATHETERIZED  Final   Special Requests   Final    NONE Performed at [facility], [place] [address], [place], [place] [address]    Culture 40,000 COLONIES/mL ENTEROCOCCUS FAECALIS (A)  Final   Report Status 10/31/2018 FINAL  Final   Organism ID, Bacteria ENTEROCOCCUS FAECALIS (A)  Final      Susceptibility   Enterococcus faecalis - MIC*    AMPICILLIN <=2 SENSITIVE Sensitive     LEVOFLOXACIN >=8 RESISTANT Resistant     NITROFURANTOIN <=16 SENSITIVE Sensitive     VANCOMYCIN 1 SENSITIVE Sensitive     * 40,000 COLONIES/mL ENTEROCOCCUS FAECALIS  Culture, blood (routine x 2)     Status: None   Collection Time: 10/30/18  2:34 PM  Result Value Ref Range Status   Specimen Description BLOOD LEFT ANTECUBITAL  Final   Special Requests   Final    BOTTLES DRAWN AEROBIC ONLY Blood Culture adequate volume   Culture   Final    NO GROWTH 5 DAYS Performed at [facility], [place] [address] [place], [place] [address]    Report Status 11/04/2018 FINAL  Final  Culture, blood (routine x 2)     Status: None   Collection Time: 10/30/18  2:40 PM  Result Value Ref Range Status   Specimen Description BLOOD LEFT HAND  Final   Special Requests   Final    BOTTLES DRAWN AEROBIC ONLY Blood Culture adequate volume   Culture  Final    NO GROWTH 5 DAYS Performed at [facility], [place] [address], [place], [place] [address]    Report Status 11/04/2018 FINAL  Final         Radiology Studies: No results found.      Scheduled Meds: . diltiazem  120 mg Oral Daily  . gabapentin  300 mg Oral Daily  . insulin aspart  0-5 Units Subcutaneous QHS  . insulin aspart  0-9 Units Subcutaneous  TID WC  . latanoprost  1 drop Both Eyes QHS  . metoprolol tartrate  12.5 mg Oral BID  . mirtazapine  7.5 mg Oral QHS  . mometasone-formoterol  2 puff Inhalation BID  . multivitamin with minerals  1 tablet Oral Daily  . oxybutynin  5 mg Oral QHS  . pantoprazole  40 mg Oral BID  . warfarin  5 mg Oral ONCE-1800  . Warfarin - Pharmacist Dosing Inpatient   Does not apply q1800   Continuous Infusions:   LOS: 9 days    Time spent: 35 mins.More than 50% of that time was spent in counseling and/or coordination of care.      Shelly Coss, MD [facility] Pager [phone]  If 7PM-7AM, please contact night-coverage [email or web address] Password [facility] 11/04/2018, 11:54 AM

## 2018-11-04 NOTE — Progress Notes (Signed)
[place] for warfarin Indication: atrial fibrillation  Patient Measurements: Height: 5' (152.4 cm) Weight: 142 lb 6.7 oz (64.6 kg) IBW/kg (Calculated) : 45.5   Vital Signs: Temp: 97.7 F (36.5 C) (01/31 0812) Temp Source: Oral (01/31 0812) BP: 133/66 (01/31 0812) Pulse Rate: 91 (01/31 0809)  Labs: Recent Labs    11/02/18 0600 11/03/18 0334 11/04/18 0513  HGB  --  12.2  --   HCT  --  38.1  --   PLT  --  420*  --   LABPROT 26.5* 24.6* 22.4*  INR 2.48 2.26 2.00  CREATININE  --  0.80  --      Medical History: Past Medical History:  Diagnosis Date  . Atrial fibrillation (HCC)    RVR hx.  Chronic Coumadin  . Bacteremia 10/2018  . C. difficile colitis 09/21/2013, 11/2013  . Carotid stenosis    a. Carotid US (10/2013):  bilat 1-39%; f/u 1 year  . Chronic lower back pain   . COPD (chronic obstructive pulmonary disease) ([place])   . Depression   . Diverticulosis of colon without hemorrhage   . GERD (gastroesophageal reflux disease)    with HH  . Glaucoma   . HCAP (healthcare-associated pneumonia) 11/21/2011  . High cholesterol   . Hypertension   . Metabolic encephalopathy 6/0/1093  . Mitral regurgitation 03/11/2012  . Pancreatitis    ~2008, 11/2011 post ERCP  . Pneumonia 12/2011   "first time I know about"  . Sepsis ([place]) 11/27/2013  . SIRS (systemic inflammatory response syndrome) ([place]) 03/13/2012   a/w post ERCP  pancreatitis.   . Syncope   . Type II diabetes mellitus [facility])     Assessment:  85 yo female with recent fall + resultant ankle fracture noted with EColi bacteremia. She has AFib and is on warfarin prior to admission.   INR is therapeutic at 2 (trending down). CBC stable. No s/sx of bleeding. PO intake varies 50-100% per meal but improved overall. Keflex discontinued.    Home warfarin regimen: 2.5mg /d except take 5mg  MWF  Goal of Therapy:  INR 2-3 Monitor platelets by anticoagulation protocol: Yes   Plan:  Warfarin  5 mg po x 1 tonight per home regimen Monitor daily INR, CBC, clinical course, s/sx of bleed, PO intake, DDI   Thank you for allowing [place] to participate in this patients care.   Sloan Leiter, PharmD, BCPS, BCCCP Clinical Pharmacist Please refer to [facility] for [place] numbers 11/04/2018 11:17 AM

## 2018-11-04 NOTE — Progress Notes (Signed)
Occupational Therapy Treatment Patient Details Name: [name] [name] MRN: [id number] DOB: 1930/01/24 Today's Date: 11/04/2018    History of present illness Patient 85 y/o female presenting to hospital 1/21 with fever, chills, and incontinence seconday to bacteremia/sepsis. Patient also has AMS due diagnosis and a UTI. PMH includes DMII, COPD, HTN, a fib, syncope, and carotid stenosis. Patient has hx of recent fall resulting in ankle/toe fx and is suppose to wear walking boot when out of house   OT comments  Pt eager to get OOB, concerned she is getting weaker and wants as much therapy as she can get. Min assist needed for OOB with RW.   Follow Up Recommendations  SNF    Equipment Recommendations  None recommended by OT    Recommendations for Other Services      Precautions / Restrictions Precautions Precautions: Fall Required Braces or Orthoses: Other Brace(wears walking boot outside) Restrictions Weight Bearing Restrictions: No       Mobility Bed Mobility Overal bed mobility: Modified Independent             General bed mobility comments: no assist, increased time  Transfers Overall transfer level: Needs assistance Equipment used: Rolling walker (2 wheeled) Transfers: Sit to/from Stand Sit to Stand: Min assist         General transfer comment: steadying assist    Balance Overall balance assessment: Needs assistance   Sitting balance-Leahy Scale: Fair       Standing balance-Leahy Scale: Poor Standing balance comment: reliant on BUE and RW to maintain standing balance                           ADL either performed or assessed with clinical judgement   ADL Overall ADL's : Needs assistance/impaired     Grooming: Set up;Sitting           Upper Body Dressing : Set up;Sitting       Toilet Transfer: Minimal assistance;Ambulation;RW;BSC   Toileting- Clothing Manipulation and Hygiene: Minimal assistance;Sit to/from stand        Functional mobility during ADLs: Minimal assistance;Rolling walker       Vision       Perception     Praxis      Cognition Arousal/Alertness: Awake/alert Behavior During Therapy: WFL for tasks assessed/performed Overall Cognitive Status: Within Functional Limits for tasks assessed                                          Exercises     Shoulder Instructions       General Comments      Pertinent Vitals/ Pain       Pain Assessment: No/denies pain  Home Living                                          Prior Functioning/Environment              Frequency  Min 2X/week        Progress Toward Goals  OT Goals(current goals can now be found in the care plan section)  Progress towards OT goals: Progressing toward goals  Acute Rehab OT Goals Patient Stated Goal: get stronger and return home OT Goal Formulation: With patient Time For Goal Achievement: 11/14/18 Potential  to Achieve Goals: Good  Plan Discharge plan remains appropriate    Co-evaluation                 AM-PAC OT "6 Clicks" Daily Activity     Outcome Measure   Help from another person eating meals?: None Help from another person taking care of personal grooming?: A Little Help from another person toileting, which includes using toliet, bedpan, or urinal?: A Little Help from another person bathing (including washing, rinsing, drying)?: A Little Help from another person to put on and taking off regular upper body clothing?: None Help from another person to put on and taking off regular lower body clothing?: A Little 6 Click Score: 20    End of Session Equipment Utilized During Treatment: Gait belt;Rolling walker  OT Visit Diagnosis: Unsteadiness on feet (R26.81);Other abnormalities of gait and mobility (R26.89);Muscle weakness (generalized) (M62.81);History of falling (Z91.81)   Activity Tolerance Patient tolerated treatment well   Patient Left in  chair;with call bell/phone within reach;with chair alarm set;with family/visitor present   Nurse Communication Mobility status        Time: 1340-1355 OT Time Calculation (min): 15 min  Charges: OT General Charges $OT Visit: 1 Visit OT Treatments $Self Care/Home Management : 8-22 mins  Nestor Lewandowsky, OTR/L Acute Rehabilitation Services Pager: [phone] Office: [phone]   Malka So 11/04/2018, 2:01 PM

## 2018-11-04 NOTE — Progress Notes (Addendum)
Pt and husband extremely upset that pt does not have approval to go to [facility]. Pt wishes she never came to the hospital due to inability to leave. Feels she is being held prisoner because she is not getting the therapy she needs to get better and go home. Feels that she is strong emotionally but this has gotten her down and feeling powerless. Pt states that if she could walk she would go home and not stay. Encouraged pt to do exercises in bed and get up to bedside commode when needed. Encouraged pt to get out of bed more tomorrow. Pt will need PT to visit. Pt resting with call bell within reach.  Will continue to monitor. Pt wants to complete survey about insurance issues.

## 2018-11-04 NOTE — Care Management Important Message (Signed)
Important Message  Patient Details  Name: [name] [name] MRN: [id number] Date of Birth: [birth year]   Medicare Important Message Given:  Yes    [name] 11/04/2018, 2:58 PM

## 2018-11-04 NOTE — Progress Notes (Signed)
Patient's authorization for SNF  currently remains pending.  Thurmond Butts, [place] Social Worker [phone]

## 2018-11-05 DIAGNOSIS — N390 Urinary tract infection, site not specified: Secondary | ICD-10-CM | POA: Diagnosis not present

## 2018-11-05 DIAGNOSIS — E119 Type 2 diabetes mellitus without complications: Secondary | ICD-10-CM | POA: Diagnosis not present

## 2018-11-05 DIAGNOSIS — R278 Other lack of coordination: Secondary | ICD-10-CM | POA: Diagnosis not present

## 2018-11-05 DIAGNOSIS — B952 Enterococcus as the cause of diseases classified elsewhere: Secondary | ICD-10-CM | POA: Diagnosis not present

## 2018-11-05 DIAGNOSIS — A419 Sepsis, unspecified organism: Secondary | ICD-10-CM | POA: Diagnosis not present

## 2018-11-05 DIAGNOSIS — R7881 Bacteremia: Secondary | ICD-10-CM | POA: Diagnosis not present

## 2018-11-05 DIAGNOSIS — S92334D Nondisplaced fracture of third metatarsal bone, right foot, subsequent encounter for fracture with routine healing: Secondary | ICD-10-CM | POA: Diagnosis not present

## 2018-11-05 DIAGNOSIS — A409 Streptococcal sepsis, unspecified: Secondary | ICD-10-CM | POA: Diagnosis not present

## 2018-11-05 DIAGNOSIS — G2581 Restless legs syndrome: Secondary | ICD-10-CM | POA: Diagnosis not present

## 2018-11-05 DIAGNOSIS — J449 Chronic obstructive pulmonary disease, unspecified: Secondary | ICD-10-CM | POA: Diagnosis not present

## 2018-11-05 DIAGNOSIS — R110 Nausea: Secondary | ICD-10-CM | POA: Diagnosis not present

## 2018-11-05 DIAGNOSIS — R2689 Other abnormalities of gait and mobility: Secondary | ICD-10-CM | POA: Diagnosis not present

## 2018-11-05 DIAGNOSIS — R41841 Cognitive communication deficit: Secondary | ICD-10-CM | POA: Diagnosis not present

## 2018-11-05 DIAGNOSIS — R197 Diarrhea, unspecified: Secondary | ICD-10-CM | POA: Diagnosis not present

## 2018-11-05 DIAGNOSIS — R4182 Altered mental status, unspecified: Secondary | ICD-10-CM | POA: Diagnosis not present

## 2018-11-05 DIAGNOSIS — R5381 Other malaise: Secondary | ICD-10-CM | POA: Diagnosis not present

## 2018-11-05 DIAGNOSIS — K580 Irritable bowel syndrome with diarrhea: Secondary | ICD-10-CM | POA: Diagnosis not present

## 2018-11-05 DIAGNOSIS — R1312 Dysphagia, oropharyngeal phase: Secondary | ICD-10-CM | POA: Diagnosis not present

## 2018-11-05 DIAGNOSIS — Z7901 Long term (current) use of anticoagulants: Secondary | ICD-10-CM | POA: Diagnosis not present

## 2018-11-05 DIAGNOSIS — D72828 Other elevated white blood cell count: Secondary | ICD-10-CM | POA: Diagnosis not present

## 2018-11-05 DIAGNOSIS — J438 Other emphysema: Secondary | ICD-10-CM | POA: Diagnosis not present

## 2018-11-05 DIAGNOSIS — A4151 Sepsis due to Escherichia coli [E. coli]: Secondary | ICD-10-CM | POA: Diagnosis not present

## 2018-11-05 DIAGNOSIS — I10 Essential (primary) hypertension: Secondary | ICD-10-CM | POA: Diagnosis not present

## 2018-11-05 DIAGNOSIS — M6281 Muscle weakness (generalized): Secondary | ICD-10-CM | POA: Diagnosis not present

## 2018-11-05 DIAGNOSIS — R6510 Systemic inflammatory response syndrome (SIRS) of non-infectious origin without acute organ dysfunction: Secondary | ICD-10-CM | POA: Diagnosis not present

## 2018-11-05 DIAGNOSIS — R1319 Other dysphagia: Secondary | ICD-10-CM | POA: Diagnosis not present

## 2018-11-05 DIAGNOSIS — I482 Chronic atrial fibrillation, unspecified: Secondary | ICD-10-CM | POA: Diagnosis not present

## 2018-11-05 DIAGNOSIS — S92344D Nondisplaced fracture of fourth metatarsal bone, right foot, subsequent encounter for fracture with routine healing: Secondary | ICD-10-CM | POA: Diagnosis not present

## 2018-11-05 DIAGNOSIS — I480 Paroxysmal atrial fibrillation: Secondary | ICD-10-CM | POA: Diagnosis not present

## 2018-11-05 LAB — PROTIME-INR
INR: 1.97
Prothrombin Time: 22.2 seconds — ABNORMAL HIGH (ref 11.4–15.2)

## 2018-11-05 LAB — GLUCOSE, CAPILLARY
GLUCOSE-CAPILLARY: 226 mg/dL — AB (ref 70–99)
Glucose-Capillary: 184 mg/dL — ABNORMAL HIGH (ref 70–99)

## 2018-11-05 MED ORDER — WARFARIN SODIUM 3 MG PO TABS
3.0000 mg | ORAL_TABLET | Freq: Once | ORAL | Status: AC
  Administered 2018-11-05: 3 mg via ORAL
  Filled 2018-11-05: qty 1

## 2018-11-05 NOTE — Progress Notes (Signed)
[place] for warfarin Indication: atrial fibrillation  Patient Measurements: Height: 5' (152.4 cm) Weight: 142 lb 6.7 oz (64.6 kg) IBW/kg (Calculated) : 45.5   Vital Signs: Temp: 97.8 F (36.6 C) (02/01 0611) Temp Source: Oral (02/01 0611) BP: 132/69 (02/01 0611) Pulse Rate: 96 (02/01 0611)  Labs: Recent Labs    11/03/18 0334 11/04/18 0513 11/05/18 0339  HGB 12.2  --   --   HCT 38.1  --   --   PLT 420*  --   --   LABPROT 24.6* 22.4* 22.2*  INR 2.26 2.00 1.97  CREATININE 0.80  --   --      Medical History: Past Medical History:  Diagnosis Date  . Atrial fibrillation (HCC)    RVR hx.  Chronic Coumadin  . Bacteremia 10/2018  . C. difficile colitis 09/21/2013, 11/2013  . Carotid stenosis    a. Carotid US (10/2013):  bilat 1-39%; f/u 1 year  . Chronic lower back pain   . COPD (chronic obstructive pulmonary disease) ([place])   . Depression   . Diverticulosis of colon without hemorrhage   . GERD (gastroesophageal reflux disease)    with HH  . Glaucoma   . HCAP (healthcare-associated pneumonia) 11/21/2011  . High cholesterol   . Hypertension   . Metabolic encephalopathy 10/13/2991  . Mitral regurgitation 03/11/2012  . Pancreatitis    ~2008, 11/2011 post ERCP  . Pneumonia 12/2011   "first time I know about"  . Sepsis ([place]) 11/27/2013  . SIRS (systemic inflammatory response syndrome) ([place]) 03/13/2012   a/w post ERCP  pancreatitis.   . Syncope   . Type II diabetes mellitus [facility])     Assessment:  85 yo female with recent fall + resultant ankle fracture noted with EColi bacteremia. She has AFib and is on warfarin prior to admission.   INR is slightly subtherapeutic at 1.97 (trending down). CBC stable. No s/sx of bleeding. PO intake varies 50% per meal. Keflex discontinued.    Home warfarin regimen: 2.5mg /d except take 5mg  MWF  Goal of Therapy:  INR 2-3 Monitor platelets by anticoagulation protocol: Yes   Plan:  Warfarin 3 mg po x  1 prior to discharge to SNF Would recommend resuming home warfarin regimen at discharge to start on 2/2 with close INR follow-up  Monitor daily INR, CBC, clinical course, s/sx of bleed, PO intake, DDI  Thank you for allowing [place] to participate in this patients care.   Antonietta Jewel, PharmD, BCCCP Clinical Pharmacist  Pager: [phone] Phone: [phone] Please refer to [place] for [place] numbers 11/05/2018 11:58 AM

## 2018-11-05 NOTE — Progress Notes (Addendum)
13:07: Patient is set to discharge to [facility] today. Patient & spouse, [name] [name], aware. Spouse plans to transport patient- CSW informed patient that [facility] could be contacted if needed. Both patient and spouse pleasant during conversation and voiced appreciation with hospital staff throughout duration of hospital stay.   CSW spoke with representative from [facility] has received authorization for patient to go to facility today. CSW notified MD who will complete DC summary.   Kingsley Spittle, [place]  [phone]

## 2018-11-05 NOTE — Discharge Summary (Signed)
Physician Discharge Summary  Jovon Streetman ERD:[id number] DOB: 1929-11-09 DOA: 10/25/2018  PCP: [name], MD  Admit date: 10/25/2018 Discharge date: 11/05/2018  Admitted From: Home Disposition:  SNF  Discharge Condition:Stable CODE STATUS:FULL Diet recommendation: Dysphagia 3 diet  Brief/Interim Summary: Patient is a 85 year old female with past medical history of chronic A. fib on warfarin, carotid stenosis, COPD, GERD, hypertension, type 2 diabetes mellitus who presented with fever, chills, dysuria and incontinence.  Work-up revealed UTI sepsis secondary to E. coli.  Urine culture also revealed Enterococcus faecalis.  Was treated initially with IV Rocephin ,changedd to Keflex.  She also got a dose of IV fosfomycin for enterococcus UTI.  She has completed the antibiotics course.She is waiting for skilled nursing facility bed.  Following problems were addressed during her hospitalization:  Gram-negative bacteremia/sepsispresent on admission:Presented with fever, tachycardia, lactic acidosis. She was confused on presentation. UA was abnormal. Blood cultures showed pansensitive E. coli.  She was treated with ceftriaxone.  Changed to Keflex.She has completed antibiotic course.Hemodynamically stable.   Urinary tract infection: Urine cultures were Enterococcus faecalis.  She received a dose of fosfomycin  Altered mental status: Metabolic encephalopathy due to sepsis. Mental status has improved and is on baseline. Currently she is alert and oriented.  Chronic A. fib with HUD:JSHFW rate better.She was given Cardizem IV push and was started on Cardizem drip on presentation. She is on oral Cardizem and metoprolol at home which we will continue .On warfarin for anticoagulation. Continue monitoring INR daily.  Dysphagia: She has history of dysphagia. She follows with GI as an outpatient. Speech therapy already evaluated her and recommended dysphagia 3  diet.  COPD:Currently stable. Continue home  inhalers .  Type 2 diabetes mellitus: On insulin at home. Resumed home meds.  Hypertension: Currently blood pressure stable.   Nausea/diarrhea: Improved   Deconditioning/debility/recent foot fracture:Physical therapy will evaluated her and recommended skilled nursing facility on discharge.     Discharge Diagnoses:  Principal Problem:   Bacteremia Active Problems:   UTI (urinary tract infection)   DM II (diabetes mellitus, type II), controlled (HCC)   HTN (hypertension)   Atrial fibrillation    Anxiety   COPD (chronic obstructive pulmonary disease) (HCC)   DM neuropathy, type II diabetes mellitus (HCC)   SIRS (systemic inflammatory response syndrome) ([place])    Discharge Instructions  Discharge Instructions    Diet - low sodium heart healthy   Complete by:  As directed    Discharge instructions   Complete by:  As directed    1)Follow up with gastroenterology as an outpatient. 2)Do CBC and BMP tests in a week.   Increase activity slowly   Complete by:  As directed      Allergies as of 11/05/2018      Reactions   Lactose Intolerance (gi) Nausea And Vomiting, Other (See Comments)   severe stomach pain   Penicillins Hives, Itching   "haven't used any since 1970's" - have gotten cephalosporins multiple times Has patient had a PCN reaction causing immediate rash, facial/tongue/throat swelling, SOB or lightheadedness with hypotension: Yes Has patient had a PCN reaction causing severe rash involving mucus membranes or skin necrosis: Unk Has patient had a PCN reaction that required hospitalization: Unk Has patient had a PCN reaction occurring within the last 10 years: No If all of the above answers are "NO", then may proceed with C   Sulfa Antibiotics Hives, Itching   "haven't used any since 1970's"   Ciprofloxacin Swelling   Site of swelling  not recalled   Sulfasalazine Hives, Itching   "haven't used any since 1970's"       Medication List    STOP taking these medications   zolpidem 10 MG tablet Commonly known as:  AMBIEN     TAKE these medications   acetaminophen 325 MG tablet Commonly known as:  TYLENOL Take 650 mg by mouth every 6 (six) hours.   diltiazem 120 MG 24 hr capsule Commonly known as:  DILT-XR TAKE 1 CAPSULE(120 MG) BY MOUTH DAILY What changed:    how much to take  how to take this  when to take this  additional instructions   EQ RESTORE PLUS LUBRICANT EYE 0.5 % Soln Generic drug:  Carboxymethylcellulose Sod PF Apply 1 drop to eye daily as needed (dry eye).   glimepiride 4 MG tablet Commonly known as:  AMARYL Take 4 mg by mouth daily with breakfast.   lactose free nutrition Liqd Take 237 mLs by mouth daily as needed (nutrition).   LANTUS SOLOSTAR 100 UNIT/ML Solostar Pen Generic drug:  Insulin Glargine Inject 22 Units into the skin daily.   latanoprost 0.005 % ophthalmic solution Commonly known as:  XALATAN Place 1 drop into both eyes at bedtime.   loperamide 2 MG tablet Commonly known as:  LOPERAMIDE A-D Take 1 tablet (2 mg total) by mouth as needed for diarrhea or loose stools (use at bedtime). What changed:    how much to take  when to take this  additional instructions   metoprolol tartrate 25 MG tablet Commonly known as:  LOPRESSOR Take 12.5 mg by mouth 2 (two) times daily.   mirtazapine 7.5 MG tablet Commonly known as:  REMERON TAKE 1 TABLET(7.5 MG) BY MOUTH AT BEDTIME What changed:  See the new instructions.   multivitamin with minerals Tabs tablet Take 1 tablet by mouth daily.   oxybutynin 5 MG tablet Commonly known as:  DITROPAN Take 1 tablet by mouth at bedtime.   pantoprazole 40 MG tablet Commonly known as:  PROTONIX Take 1 tablet (40 mg total) by mouth 2 (two) times daily.   promethazine 25 MG tablet Commonly known as:  PHENERGAN Take 25 mg by mouth 3 (three) times daily as needed for nausea or vomiting.   rOPINIRole 0.5 MG  tablet Commonly known as:  REQUIP Take 1 tablet by mouth at bedtime.   SYMBICORT 160-4.5 MCG/ACT inhaler Generic drug:  budesonide-formoterol Inhale 2 puffs into the lungs 2 (two) times daily.   warfarin 5 MG tablet Commonly known as:  COUMADIN Take 0.5-1 tablets (2.5-5 mg total) by mouth daily. Take 1.25mg  every Sun/Wed then 2.5 mg the rest of the week What changed:    when to take this  additional instructions      Follow-up Information    [name], MD. Schedule an appointment as soon as possible for a visit in 1 week(s).   Specialty:  Internal Medicine Contact information: [place] [address] [phone]          Allergies  Allergen Reactions  . Lactose Intolerance (Gi) Nausea And Vomiting and Other (See Comments)    severe stomach pain  . Penicillins Hives and Itching    "haven't used any since 1970's" - have gotten cephalosporins multiple times Has patient had a PCN reaction causing immediate rash, facial/tongue/throat swelling, SOB or lightheadedness with hypotension: Yes Has patient had a PCN reaction causing severe rash involving mucus membranes or skin necrosis: Unk Has patient had a PCN reaction that required hospitalization:  Unk Has patient had a PCN reaction occurring within the last 10 years: No If all of the above answers are "NO", then may proceed with C  . Sulfa Antibiotics Hives and Itching    "haven't used any since 1970's"  . Ciprofloxacin Swelling    Site of swelling not recalled  . Sulfasalazine Hives and Itching    "haven't used any since 1970's"    Consultations: None  Procedures/Studies: Dg Chest 1 View  Result Date: 10/29/2018 CLINICAL DATA:  Difficulty breathing EXAM: CHEST  1 VIEW COMPARISON:  10/25/2018 FINDINGS: Cardiac shadow is mildly enlarged. Aortic calcifications and mitral annular calcifications are noted. Elevation of the right hemidiaphragm is seen. Platelike atelectasis is again noted on the  left. No focal confluent infiltrate is seen. No bony abnormality is noted. IMPRESSION: Stable appearance of the chest from the previous exam. Electronically Signed   By: Inez Catalina M.D.   On: 10/29/2018 16:43   Dg Chest 2 View  Result Date: 10/25/2018 CLINICAL DATA:  Atrial fibrillation with rapid ventricular response, woke up this morning with chills, shaking, body aches, history COPD, hypertension EXAM: CHEST - 2 VIEW COMPARISON:  06/01/2018 FINDINGS: Upper normal heart size with mitral annular calcification. Atherosclerotic calcification aorta. Mediastinal contours and pulmonary vascularity normal. Bronchitic changes with minimal LEFT basilar atelectasis. Lungs otherwise clear. No pulmonary infiltrate, pleural effusion or pneumothorax. Diffuse osseous demineralization. IMPRESSION: Bronchitic changes with minimal LEFT basilar atelectasis. Electronically Signed   By: Lavonia Dana M.D.   On: 10/25/2018 13:17   Dg Esophagus Inc Scout Chest & Delayed Img Single Cm (ba Or Sol)  Result Date: 10/21/2018 CLINICAL DATA:  Dysphagia. EXAM: ESOPHOGRAM/BARIUM SWALLOW TECHNIQUE: Single contrast examination was performed using  thin barium. FLUOROSCOPY TIME:  Fluoroscopy Time:  2 minutes 6 seconds COMPARISON:  Esophagram dated 12/19/2013 FINDINGS: The oropharyngeal swallowing mechanisms are normal. The patient has a poor secondary stripping wave and intermittent extensive tertiary contractions in the distal 1/2 of the esophagus. No appreciable hiatal hernia even with Valsalva maneuver. The patient ingested a 13 mm barium tablet which passed immediately from the mouth to the distal esophagus just above the gastroesophageal junction. However, despite repeated swallows of water and barium and being placed in a semi upright position, the tablet did not pass into the stomach. The patient was observed for 5 minutes and the tablet still did not pass. No appreciable mass. IMPRESSION: 1. Severe esophageal motility disorder with  a poor secondary stripping wave and numerous intermittent tertiary contractions in the distal 1/2 of the esophagus. 2. The 13 mm tablet would not pass into the stomach from the distal esophagus. This is either due to a stricture or persistent spasm. There is no appreciable esophagitis or mass at that level. Electronically Signed   By: Lorriane Shire M.D.   On: 10/21/2018 13:47       Subjective: Patient seen and examined the bedside this morning.  Remains comfortable.  Hemodynamically stable for discharge.  Discharge Exam: Vitals:   11/05/18 0611 11/05/18 1108  BP: 132/69   Pulse: 96   Resp: 17   Temp: 97.8 F (36.6 C)   SpO2: 94% 96%   Vitals:   11/04/18 2028 11/05/18 0025 11/05/18 0611 11/05/18 1108  BP:  119/63 132/69   Pulse:   96   Resp:  14 17   Temp:  97.8 F (36.6 C) 97.8 F (36.6 C)   TempSrc:  Oral Oral   SpO2: 95% 94% 94% 96%  Weight:  Height:        General: Pt is alert, awake, not in acute distress Cardiovascular: RRR, S1/S2 +, no rubs, no gallops Respiratory: CTA bilaterally, no wheezing, no rhonchi Abdominal: Soft, NT, ND, bowel sounds + Extremities: no edema, no cyanosis    The results of significant diagnostics from this hospitalization (including imaging, microbiology, ancillary and laboratory) are listed below for reference.     Microbiology: Recent Results (from the past 240 hour(s))  Culture, Urine     Status: Abnormal   Collection Time: 10/29/18  9:57 AM  Result Value Ref Range Status   Specimen Description URINE, CATHETERIZED  Final   Special Requests   Final    NONE Performed at [facility], [address], [place], [place] [address]    Culture 40,000 COLONIES/mL ENTEROCOCCUS FAECALIS (A)  Final   Report Status 10/31/2018 FINAL  Final   Organism ID, Bacteria ENTEROCOCCUS FAECALIS (A)  Final      Susceptibility   Enterococcus faecalis - MIC*    AMPICILLIN <=2 SENSITIVE Sensitive     LEVOFLOXACIN >=8 RESISTANT Resistant      NITROFURANTOIN <=16 SENSITIVE Sensitive     VANCOMYCIN 1 SENSITIVE Sensitive     * 40,000 COLONIES/mL ENTEROCOCCUS FAECALIS  Culture, blood (routine x 2)     Status: None   Collection Time: 10/30/18  2:34 PM  Result Value Ref Range Status   Specimen Description BLOOD LEFT ANTECUBITAL  Final   Special Requests   Final    BOTTLES DRAWN AEROBIC ONLY Blood Culture adequate volume   Culture   Final    NO GROWTH 5 DAYS Performed at [facility], [place] [address] [place], [place] [address]    Report Status 11/04/2018 FINAL  Final  Culture, blood (routine x 2)     Status: None   Collection Time: 10/30/18  2:40 PM  Result Value Ref Range Status   Specimen Description BLOOD LEFT HAND  Final   Special Requests   Final    BOTTLES DRAWN AEROBIC ONLY Blood Culture adequate volume   Culture   Final    NO GROWTH 5 DAYS Performed at [facility], [place] [address] [place], [place] [address]    Report Status 11/04/2018 FINAL  Final     Labs: BNP (last 3 results) Recent Labs    10/29/18 1409  BNP 099.8*   Basic Metabolic Panel: Recent Labs  Lab 10/30/18 0500 10/31/18 0441 11/03/18 0334  NA 139 137 137  K 2.9* 4.4 3.6  CL 103 101 100  CO2 24 25 24   GLUCOSE 140* 147* 184*  BUN 5* 8 10  CREATININE 0.72 0.75 0.80  CALCIUM 8.6* 8.6* 9.0  MG 1.4* 1.8  --   PHOS 2.6 2.7  --    Liver Function Tests: Recent Labs  Lab 10/30/18 0500 10/31/18 0441  ALBUMIN 2.6* 2.4*   No results for input(s): LIPASE, AMYLASE in the last 168 hours. No results for input(s): AMMONIA in the last 168 hours. CBC: Recent Labs  Lab 10/30/18 0500 11/03/18 0334  WBC 10.5 12.3*  NEUTROABS 7.2 7.8*  HGB 11.0* 12.2  HCT 33.8* 38.1  MCV 92.3 92.5  PLT 235 420*   Cardiac Enzymes: No results for input(s): CKTOTAL, CKMB, CKMBINDEX, TROPONINI in the last 168 hours. BNP: Invalid input(s): POCBNP CBG: Recent Labs  Lab 11/04/18 0624 11/04/18 1104 11/04/18 1552 11/04/18 2111 11/05/18 0609   GLUCAP 194* 246* 146* 208* 184*   D-Dimer No  results for input(s): DDIMER in the last 72 hours. Hgb A1c No results for input(s): HGBA1C in the last 72 hours. Lipid Profile No results for input(s): CHOL, HDL, LDLCALC, TRIG, CHOLHDL, LDLDIRECT in the last 72 hours. Thyroid function studies No results for input(s): TSH, T4TOTAL, T3FREE, THYROIDAB in the last 72 hours.  Invalid input(s): FREET3 Anemia work up No results for input(s): VITAMINB12, FOLATE, FERRITIN, TIBC, IRON, RETICCTPCT in the last 72 hours. Urinalysis    Component Value Date/Time   COLORURINE YELLOW 10/29/2018 1720   APPEARANCEUR HAZY (A) 10/29/2018 1720   LABSPEC 1.008 10/29/2018 1720   PHURINE 6.0 10/29/2018 1720   GLUCOSEU NEGATIVE 10/29/2018 1720   HGBUR SMALL (A) 10/29/2018 1720   BILIRUBINUR NEGATIVE 10/29/2018 1720   KETONESUR NEGATIVE 10/29/2018 1720   PROTEINUR NEGATIVE 10/29/2018 1720   UROBILINOGEN 0.2 11/23/2013 1431   NITRITE NEGATIVE 10/29/2018 1720   LEUKOCYTESUR MODERATE (A) 10/29/2018 1720   Sepsis Labs Invalid input(s): PROCALCITONIN,  WBC,  LACTICIDVEN Microbiology Recent Results (from the past 240 hour(s))  Culture, Urine     Status: Abnormal   Collection Time: 10/29/18  9:57 AM  Result Value Ref Range Status   Specimen Description URINE, CATHETERIZED  Final   Special Requests   Final    NONE Performed at [facility], [address], [place], [place] [address]    Culture 40,000 COLONIES/mL ENTEROCOCCUS FAECALIS (A)  Final   Report Status 10/31/2018 FINAL  Final   Organism ID, Bacteria ENTEROCOCCUS FAECALIS (A)  Final      Susceptibility   Enterococcus faecalis - MIC*    AMPICILLIN <=2 SENSITIVE Sensitive     LEVOFLOXACIN >=8 RESISTANT Resistant     NITROFURANTOIN <=16 SENSITIVE Sensitive     VANCOMYCIN 1 SENSITIVE Sensitive     * 40,000 COLONIES/mL ENTEROCOCCUS FAECALIS  Culture, blood (routine x 2)     Status: None   Collection Time: 10/30/18  2:34 PM  Result Value Ref  Range Status   Specimen Description BLOOD LEFT ANTECUBITAL  Final   Special Requests   Final    BOTTLES DRAWN AEROBIC ONLY Blood Culture adequate volume   Culture   Final    NO GROWTH 5 DAYS Performed at [facility], [place] [address], [place], [place] [address]    Report Status 11/04/2018 FINAL  Final  Culture, blood (routine x 2)     Status: None   Collection Time: 10/30/18  2:40 PM  Result Value Ref Range Status   Specimen Description BLOOD LEFT HAND  Final   Special Requests   Final    BOTTLES DRAWN AEROBIC ONLY Blood Culture adequate volume   Culture   Final    NO GROWTH 5 DAYS Performed at [facility], [place] [address], [place], [place] [address]    Report Status 11/04/2018 FINAL  Final    Please note: You were cared for by a hospitalist during your hospital stay. Once you are discharged, your primary care physician will handle any further medical issues. Please note that NO REFILLS for any discharge medications will be authorized once you are discharged, as it is imperative that you return to your primary care physician (or establish a relationship with a primary care physician if you do not have one) for your post hospital discharge needs so that they can reassess your need for medications and monitor your lab values.    Time coordinating discharge: 40 minutes  SIGNED:   Shelly Coss, MD  [facility] 11/05/2018, 11:13 AM Pager [phone]  If 7PM-7AM, please contact night-coverage [email or web address] Password [email or web address]

## 2018-11-05 NOTE — Progress Notes (Signed)
Pt discharged to SNF with husband driving.  All instructions given and reviewed, all questions answered.  Attempted to call report, got vm, left message with call back number.

## 2018-11-07 DIAGNOSIS — A4151 Sepsis due to Escherichia coli [E. coli]: Secondary | ICD-10-CM | POA: Diagnosis not present

## 2018-11-07 DIAGNOSIS — I480 Paroxysmal atrial fibrillation: Secondary | ICD-10-CM | POA: Diagnosis not present

## 2018-11-07 DIAGNOSIS — J438 Other emphysema: Secondary | ICD-10-CM | POA: Diagnosis not present

## 2018-11-07 DIAGNOSIS — N390 Urinary tract infection, site not specified: Secondary | ICD-10-CM | POA: Diagnosis not present

## 2018-11-08 DIAGNOSIS — I480 Paroxysmal atrial fibrillation: Secondary | ICD-10-CM | POA: Diagnosis not present

## 2018-11-08 DIAGNOSIS — N390 Urinary tract infection, site not specified: Secondary | ICD-10-CM | POA: Diagnosis not present

## 2018-11-08 DIAGNOSIS — B952 Enterococcus as the cause of diseases classified elsewhere: Secondary | ICD-10-CM | POA: Diagnosis not present

## 2018-11-08 DIAGNOSIS — A4151 Sepsis due to Escherichia coli [E. coli]: Secondary | ICD-10-CM | POA: Diagnosis not present

## 2018-11-10 DIAGNOSIS — D72828 Other elevated white blood cell count: Secondary | ICD-10-CM | POA: Diagnosis not present

## 2018-11-10 DIAGNOSIS — K580 Irritable bowel syndrome with diarrhea: Secondary | ICD-10-CM | POA: Diagnosis not present

## 2018-11-10 DIAGNOSIS — G2581 Restless legs syndrome: Secondary | ICD-10-CM | POA: Diagnosis not present

## 2018-11-10 DIAGNOSIS — R5381 Other malaise: Secondary | ICD-10-CM | POA: Diagnosis not present

## 2018-11-14 DIAGNOSIS — S92344D Nondisplaced fracture of fourth metatarsal bone, right foot, subsequent encounter for fracture with routine healing: Secondary | ICD-10-CM | POA: Diagnosis not present

## 2018-11-14 DIAGNOSIS — S92334D Nondisplaced fracture of third metatarsal bone, right foot, subsequent encounter for fracture with routine healing: Secondary | ICD-10-CM | POA: Diagnosis not present

## 2018-11-18 DIAGNOSIS — R2689 Other abnormalities of gait and mobility: Secondary | ICD-10-CM | POA: Diagnosis not present

## 2018-11-18 DIAGNOSIS — I480 Paroxysmal atrial fibrillation: Secondary | ICD-10-CM | POA: Diagnosis not present

## 2018-11-18 DIAGNOSIS — E119 Type 2 diabetes mellitus without complications: Secondary | ICD-10-CM | POA: Diagnosis not present

## 2018-11-18 DIAGNOSIS — I10 Essential (primary) hypertension: Secondary | ICD-10-CM | POA: Diagnosis not present

## 2018-11-18 DIAGNOSIS — G2581 Restless legs syndrome: Secondary | ICD-10-CM | POA: Diagnosis not present

## 2018-11-18 DIAGNOSIS — R5381 Other malaise: Secondary | ICD-10-CM | POA: Diagnosis not present

## 2018-11-21 ENCOUNTER — Telehealth: Payer: Self-pay | Admitting: Internal Medicine

## 2018-11-21 NOTE — Telephone Encounter (Signed)
Patient is advised that she should go ahead and complete the breath test at their convenience

## 2018-11-21 NOTE — Telephone Encounter (Signed)
[name] called -- pt was given SIBO kit in 2019.  Husband reported that pt has not taken the kit because she was hospitalised.  Pt had a bladder infection.  [name] would like to know if she should still take the SIBO kit.

## 2018-11-22 DIAGNOSIS — I480 Paroxysmal atrial fibrillation: Secondary | ICD-10-CM | POA: Diagnosis not present

## 2018-11-22 DIAGNOSIS — E119 Type 2 diabetes mellitus without complications: Secondary | ICD-10-CM | POA: Diagnosis not present

## 2018-11-22 DIAGNOSIS — Z7901 Long term (current) use of anticoagulants: Secondary | ICD-10-CM | POA: Diagnosis not present

## 2018-11-22 DIAGNOSIS — I10 Essential (primary) hypertension: Secondary | ICD-10-CM | POA: Diagnosis not present

## 2018-11-24 DIAGNOSIS — R509 Fever, unspecified: Secondary | ICD-10-CM | POA: Diagnosis not present

## 2018-11-24 DIAGNOSIS — R05 Cough: Secondary | ICD-10-CM | POA: Diagnosis not present

## 2018-11-24 DIAGNOSIS — Z7901 Long term (current) use of anticoagulants: Secondary | ICD-10-CM | POA: Diagnosis not present

## 2018-11-24 DIAGNOSIS — I480 Paroxysmal atrial fibrillation: Secondary | ICD-10-CM | POA: Diagnosis not present

## 2018-11-24 DIAGNOSIS — D649 Anemia, unspecified: Secondary | ICD-10-CM | POA: Diagnosis not present

## 2018-11-24 DIAGNOSIS — Z5181 Encounter for therapeutic drug level monitoring: Secondary | ICD-10-CM | POA: Diagnosis not present

## 2018-11-25 ENCOUNTER — Encounter (HOSPITAL_COMMUNITY): Payer: Self-pay | Admitting: Emergency Medicine

## 2018-11-25 ENCOUNTER — Emergency Department (HOSPITAL_COMMUNITY): Payer: Medicare HMO

## 2018-11-25 ENCOUNTER — Inpatient Hospital Stay (HOSPITAL_COMMUNITY)
Admission: EM | Admit: 2018-11-25 | Discharge: 2018-11-29 | DRG: 871 | Disposition: A | Discharge status: Skilled Nursing Facility | Payer: Medicare HMO | Attending: Internal Medicine | Admitting: Internal Medicine

## 2018-11-25 DIAGNOSIS — R5381 Other malaise: Secondary | ICD-10-CM | POA: Diagnosis not present

## 2018-11-25 DIAGNOSIS — Z823 Family history of stroke: Secondary | ICD-10-CM

## 2018-11-25 DIAGNOSIS — Z7951 Long term (current) use of inhaled steroids: Secondary | ICD-10-CM | POA: Diagnosis not present

## 2018-11-25 DIAGNOSIS — Z794 Long term (current) use of insulin: Secondary | ICD-10-CM

## 2018-11-25 DIAGNOSIS — G9340 Encephalopathy, unspecified: Secondary | ICD-10-CM | POA: Diagnosis not present

## 2018-11-25 DIAGNOSIS — Z90710 Acquired absence of both cervix and uterus: Secondary | ICD-10-CM | POA: Diagnosis not present

## 2018-11-25 DIAGNOSIS — K219 Gastro-esophageal reflux disease without esophagitis: Secondary | ICD-10-CM | POA: Diagnosis present

## 2018-11-25 DIAGNOSIS — J101 Influenza due to other identified influenza virus with other respiratory manifestations: Secondary | ICD-10-CM

## 2018-11-25 DIAGNOSIS — H409 Unspecified glaucoma: Secondary | ICD-10-CM | POA: Diagnosis present

## 2018-11-25 DIAGNOSIS — R4020 Unspecified coma: Secondary | ICD-10-CM | POA: Diagnosis not present

## 2018-11-25 DIAGNOSIS — N3001 Acute cystitis with hematuria: Secondary | ICD-10-CM | POA: Diagnosis not present

## 2018-11-25 DIAGNOSIS — J440 Chronic obstructive pulmonary disease with acute lower respiratory infection: Secondary | ICD-10-CM | POA: Diagnosis present

## 2018-11-25 DIAGNOSIS — E1140 Type 2 diabetes mellitus with diabetic neuropathy, unspecified: Secondary | ICD-10-CM | POA: Diagnosis present

## 2018-11-25 DIAGNOSIS — Z882 Allergy status to sulfonamides status: Secondary | ICD-10-CM

## 2018-11-25 DIAGNOSIS — E119 Type 2 diabetes mellitus without complications: Secondary | ICD-10-CM | POA: Diagnosis not present

## 2018-11-25 DIAGNOSIS — I4891 Unspecified atrial fibrillation: Secondary | ICD-10-CM | POA: Diagnosis present

## 2018-11-25 DIAGNOSIS — Z9049 Acquired absence of other specified parts of digestive tract: Secondary | ICD-10-CM | POA: Diagnosis not present

## 2018-11-25 DIAGNOSIS — G8929 Other chronic pain: Secondary | ICD-10-CM | POA: Diagnosis present

## 2018-11-25 DIAGNOSIS — N390 Urinary tract infection, site not specified: Secondary | ICD-10-CM | POA: Diagnosis not present

## 2018-11-25 DIAGNOSIS — I10 Essential (primary) hypertension: Secondary | ICD-10-CM | POA: Diagnosis present

## 2018-11-25 DIAGNOSIS — G9341 Metabolic encephalopathy: Secondary | ICD-10-CM | POA: Diagnosis present

## 2018-11-25 DIAGNOSIS — E871 Hypo-osmolality and hyponatremia: Secondary | ICD-10-CM | POA: Diagnosis present

## 2018-11-25 DIAGNOSIS — J189 Pneumonia, unspecified organism: Secondary | ICD-10-CM | POA: Diagnosis present

## 2018-11-25 DIAGNOSIS — Z881 Allergy status to other antibiotic agents status: Secondary | ICD-10-CM

## 2018-11-25 DIAGNOSIS — R6510 Systemic inflammatory response syndrome (SIRS) of non-infectious origin without acute organ dysfunction: Secondary | ICD-10-CM | POA: Diagnosis not present

## 2018-11-25 DIAGNOSIS — E11649 Type 2 diabetes mellitus with hypoglycemia without coma: Secondary | ICD-10-CM | POA: Diagnosis present

## 2018-11-25 DIAGNOSIS — F329 Major depressive disorder, single episode, unspecified: Secondary | ICD-10-CM | POA: Diagnosis present

## 2018-11-25 DIAGNOSIS — R41841 Cognitive communication deficit: Secondary | ICD-10-CM | POA: Diagnosis not present

## 2018-11-25 DIAGNOSIS — R000 Tachycardia, unspecified: Secondary | ICD-10-CM | POA: Diagnosis not present

## 2018-11-25 DIAGNOSIS — R419 Unspecified symptoms and signs involving cognitive functions and awareness: Secondary | ICD-10-CM | POA: Diagnosis not present

## 2018-11-25 DIAGNOSIS — J449 Chronic obstructive pulmonary disease, unspecified: Secondary | ICD-10-CM | POA: Diagnosis present

## 2018-11-25 DIAGNOSIS — E876 Hypokalemia: Secondary | ICD-10-CM | POA: Diagnosis present

## 2018-11-25 DIAGNOSIS — Z7401 Bed confinement status: Secondary | ICD-10-CM | POA: Diagnosis not present

## 2018-11-25 DIAGNOSIS — M2550 Pain in unspecified joint: Secondary | ICD-10-CM | POA: Diagnosis not present

## 2018-11-25 DIAGNOSIS — R55 Syncope and collapse: Secondary | ICD-10-CM | POA: Diagnosis present

## 2018-11-25 DIAGNOSIS — Z87440 Personal history of urinary (tract) infections: Secondary | ICD-10-CM | POA: Diagnosis not present

## 2018-11-25 DIAGNOSIS — J1000 Influenza due to other identified influenza virus with unspecified type of pneumonia: Secondary | ICD-10-CM | POA: Diagnosis present

## 2018-11-25 DIAGNOSIS — R4182 Altered mental status, unspecified: Secondary | ICD-10-CM

## 2018-11-25 DIAGNOSIS — Z79899 Other long term (current) drug therapy: Secondary | ICD-10-CM

## 2018-11-25 DIAGNOSIS — K589 Irritable bowel syndrome without diarrhea: Secondary | ICD-10-CM | POA: Diagnosis present

## 2018-11-25 DIAGNOSIS — J438 Other emphysema: Secondary | ICD-10-CM | POA: Diagnosis not present

## 2018-11-25 DIAGNOSIS — I340 Nonrheumatic mitral (valve) insufficiency: Secondary | ICD-10-CM | POA: Diagnosis present

## 2018-11-25 DIAGNOSIS — R2689 Other abnormalities of gait and mobility: Secondary | ICD-10-CM | POA: Diagnosis not present

## 2018-11-25 DIAGNOSIS — Y95 Nosocomial condition: Secondary | ICD-10-CM | POA: Diagnosis present

## 2018-11-25 DIAGNOSIS — R5081 Fever presenting with conditions classified elsewhere: Secondary | ICD-10-CM | POA: Diagnosis not present

## 2018-11-25 DIAGNOSIS — D696 Thrombocytopenia, unspecified: Secondary | ICD-10-CM | POA: Diagnosis present

## 2018-11-25 DIAGNOSIS — R404 Transient alteration of awareness: Secondary | ICD-10-CM | POA: Diagnosis not present

## 2018-11-25 DIAGNOSIS — Z880 Allergy status to penicillin: Secondary | ICD-10-CM

## 2018-11-25 DIAGNOSIS — Z91011 Allergy to milk products: Secondary | ICD-10-CM

## 2018-11-25 DIAGNOSIS — R1312 Dysphagia, oropharyngeal phase: Secondary | ICD-10-CM | POA: Diagnosis not present

## 2018-11-25 DIAGNOSIS — Z7901 Long term (current) use of anticoagulants: Secondary | ICD-10-CM | POA: Diagnosis not present

## 2018-11-25 DIAGNOSIS — I480 Paroxysmal atrial fibrillation: Secondary | ICD-10-CM | POA: Diagnosis not present

## 2018-11-25 DIAGNOSIS — J984 Other disorders of lung: Secondary | ICD-10-CM | POA: Diagnosis not present

## 2018-11-25 DIAGNOSIS — A419 Sepsis, unspecified organism: Principal | ICD-10-CM | POA: Diagnosis present

## 2018-11-25 DIAGNOSIS — I499 Cardiac arrhythmia, unspecified: Secondary | ICD-10-CM | POA: Diagnosis not present

## 2018-11-25 DIAGNOSIS — M6281 Muscle weakness (generalized): Secondary | ICD-10-CM | POA: Diagnosis not present

## 2018-11-25 MED ORDER — ACETAMINOPHEN 650 MG RE SUPP
650.0000 mg | Freq: Once | RECTAL | Status: AC
  Administered 2018-11-26: 650 mg via RECTAL
  Filled 2018-11-25: qty 1

## 2018-11-25 MED ORDER — SODIUM CHLORIDE 0.9 % IV BOLUS
1000.0000 mL | Freq: Once | INTRAVENOUS | Status: AC
  Administered 2018-11-26: 1000 mL via INTRAVENOUS

## 2018-11-25 MED ORDER — ALBUTEROL SULFATE (2.5 MG/3ML) 0.083% IN NEBU
5.0000 mg | INHALATION_SOLUTION | Freq: Once | RESPIRATORY_TRACT | Status: AC
  Administered 2018-11-26: 5 mg via RESPIRATORY_TRACT
  Filled 2018-11-25: qty 6

## 2018-11-25 NOTE — ED Provider Notes (Signed)
[facility] EMERGENCY DEPARTMENT Provider Note   CSN: [id number] Arrival date & time: 11/25/18  2311    History   Chief Complaint Chief Complaint  Patient presents with  . Altered Mental Status    HPI Cresencia Asmus is a 85 y.o. female.     The history is provided by the patient and medical records.  Altered Mental Status    Level 5 caveat: Altered mental status 85 y.o. F with hx of AFIB on coumadin, GERD, depression, COPD, HTN, history of syncope, diabetes, mitral regurgitation, depression, acid reflux, presenting to the ED with altered mental status.  Majority of history provided by EMS.  Patient is a resident at [facility] and rehab.  Found to be altered today by nursing staff.  Recently treated for sepsis secondary to UTI.  Initially patient was somewhat unresponsive, but after 700 cc fluid EMS she is starting to awake some.  Did have fever at facility, given Tylenol suppository at 9 PM.  She was also given dose of IM Rocephin as urine was obtained and appeared cloudy but currently awaiting results.  Does have history of recurrent UTIs in the past.   Patient was around 92% on RA with EMS, started on 2L.  Does not generally require O2.  Past Medical History:  Diagnosis Date  . Atrial fibrillation (HCC)    RVR hx.  Chronic Coumadin  . Bacteremia 10/2018  . C. difficile colitis 09/21/2013, 11/2013  . Carotid stenosis    a. Carotid US (10/2013):  bilat 1-39%; f/u 1 year  . Chronic lower back pain   . COPD (chronic obstructive pulmonary disease) ([place])   . Depression   . Diverticulosis of colon without hemorrhage   . GERD (gastroesophageal reflux disease)    with HH  . Glaucoma   . HCAP (healthcare-associated pneumonia) 11/21/2011  . High cholesterol   . Hypertension   . Metabolic encephalopathy 0/03/3015  . Mitral regurgitation 03/11/2012  . Pancreatitis    ~2008, 11/2011 post ERCP  . Pneumonia 12/2011   "first time I know about"  . Sepsis ([place])  11/27/2013  . SIRS (systemic inflammatory response syndrome) ([place]) 03/13/2012   a/w post ERCP  pancreatitis.   . Syncope   . Type II diabetes mellitus [facility])     Patient Active Problem List   Diagnosis Date Noted  . Bacteremia 10/26/2018  . SIRS (systemic inflammatory response syndrome) ([place]) 10/25/2018  . Hemorrhoids   . Shock ([place])   . DM neuropathy, type II diabetes mellitus ([place]) 01/07/2018  . Hip fracture ([place]) 01/06/2018  . Advance care planning   . Goals of care, counseling/discussion   . COPD (chronic obstructive pulmonary disease) ([place]) 10/28/2017  . RLQ abdominal pain 04/24/2014  . Nausea alone 04/11/2014  . Abdominal pain 08/05/2012    Class: Acute  . Irritable bowel syndrome 04/12/2012  . Chronic diarrhea 04/12/2012  . Encounter for long-term (current) use of anticoagulants 03/17/2012  . Depression 03/13/2012  . Anxiety 03/13/2012  . Mitral regurgitation 03/11/2012  . Chronic epigastric pain 01/25/2012  . Coagulopathy ([place]) 12/12/2011  . Symptomatic anemia 12/12/2011  . Atrial fibrillation    . UTI (urinary tract infection) 11/13/2011  . DM II (diabetes mellitus, type II), controlled ([place]) 11/13/2011  . HTN (hypertension) 11/13/2011  . Hypercholesteremia 11/13/2011  . Glaucoma (increased eye pressure) 11/13/2011    Past Surgical History:  Procedure Laterality Date  . APPENDECTOMY  ~ 1960  . CATARACT EXTRACTION W/ INTRAOCULAR LENS  IMPLANT, BILATERAL  2000's  . CHOLECYSTECTOMY  1990's   Lap chole  . COLONOSCOPY  01/2003   diverticulosis, 25mm hyperplastic sigmoid polyp  . COLONOSCOPY WITH PROPOFOL N/A 06/02/2018   Procedure: COLONOSCOPY WITH PROPOFOL;  Surgeon: Lavena Bullion, DO;  Location: [facility];  Service: Gastroenterology;  Laterality: N/A;  . DILATION AND CURETTAGE OF UTERUS    . ERCP  11/13/2011   Procedure: ENDOSCOPIC RETROGRADE CHOLANGIOPANCREATOGRAPHY (ERCP);  Surgeon: Beryle Beams, MD;  Location: [facility] ENDOSCOPY;  Service: Endoscopy;   Laterality: N/A;  . FLEXIBLE SIGMOIDOSCOPY N/A 05/02/2014   Procedure: FLEXIBLE SIGMOIDOSCOPY;  Surgeon: Gatha Mayer, MD;  Location: [facility] ENDOSCOPY;  Service: Endoscopy;  Laterality: N/A;  . HIP ARTHROPLASTY Left 01/07/2018   Procedure: ARTHROPLASTY BIPOLAR HIP (HEMIARTHROPLASTY);  Surgeon: Hiram Gash, MD;  Location: [facility];  Service: Orthopedics;  Laterality: Left;  . TONSILLECTOMY AND ADENOIDECTOMY  ~ 1939  . VAGINAL HYSTERECTOMY  ~ 1960's     OB History   No obstetric history on file.      Home Medications    Prior to Admission medications   Medication Sig Start Date End Date Taking? Authorizing Provider  acetaminophen (TYLENOL) 325 MG tablet Take 650 mg by mouth every 6 (six) hours.    [provider]  Carboxymethylcellulose Sod PF (EQ RESTORE PLUS LUBRICANT EYE) 0.5 % SOLN Apply 1 drop to eye daily as needed (dry eye).     [provider]  diltiazem (DILT-XR) 120 MG 24 hr capsule TAKE 1 CAPSULE(120 MG) BY MOUTH DAILY Patient taking differently: Take 120 mg by mouth daily.  01/04/18   Minus Breeding, MD  glimepiride (AMARYL) 4 MG tablet Take 4 mg by mouth daily with breakfast.     [provider]  lactose free nutrition (BOOST) LIQD Take 237 mLs by mouth daily as needed (nutrition).     [provider]  LANTUS SOLOSTAR 100 UNIT/ML Solostar Pen Inject 22 Units into the skin daily.  09/01/13   [provider]  latanoprost (XALATAN) 0.005 % ophthalmic solution Place 1 drop into both eyes at bedtime.    [provider]  loperamide (LOPERAMIDE A-D) 2 MG tablet Take 1 tablet (2 mg total) by mouth as needed for diarrhea or loose stools (use at bedtime). Patient taking differently: Take 1-2 mg by mouth See admin instructions. Take 1 mg by mouth at bedtime and 2 mg four times a day as needed for diarrhea or loose stools 11/27/14   Gatha Mayer, MD  metoprolol tartrate (LOPRESSOR) 25 MG tablet Take 12.5 mg by mouth 2 (two) times daily.     [provider]  mirtazapine (REMERON) 7.5 MG tablet TAKE 1 TABLET(7.5 MG) BY MOUTH AT BEDTIME Patient taking differently: Take 7.5 mg by mouth at bedtime.  10/06/16   Gatha Mayer, MD  Multiple Vitamin (MULTIVITAMIN WITH MINERALS) TABS Take 1 tablet by mouth daily.    [provider]  oxybutynin (DITROPAN) 5 MG tablet Take 1 tablet by mouth at bedtime. 07/29/16   [provider]  pantoprazole (PROTONIX) 40 MG tablet Take 1 tablet (40 mg total) by mouth 2 (two) times daily. 06/07/18 10/26/18  Arrien, Jimmy Picket, MD  promethazine (PHENERGAN) 25 MG tablet Take 25 mg by mouth 3 (three) times daily as needed for nausea or vomiting.    [provider]  rOPINIRole (REQUIP) 0.5 MG tablet Take 1 tablet by mouth at bedtime. 10/19/18   [provider]  SYMBICORT 160-4.5 MCG/ACT inhaler Inhale 2 puffs  into the lungs 2 (two) times daily.  03/08/14   [provider]  warfarin (COUMADIN) 5 MG tablet Take 0.5-1 tablets (2.5-5 mg total) by mouth daily. Take 1.25mg  every Sun/Wed then 2.5 mg the rest of the week Patient taking differently: Take 2.5-5 mg by mouth See admin instructions. Take 2.5mg  by mouth on Mon, Wed, Fri and take 5mg  all other days. 01/12/18   Cristal Ford, DO    Family History Family History  Problem Relation Age of Onset  . Stroke Father 47  . Stroke Mother 78  . Anesthesia problems Neg Hx   . Hypotension Neg Hx   . Malignant hyperthermia Neg Hx   . Pseudochol deficiency Neg Hx   . Stomach cancer Neg Hx   . Esophageal cancer Neg Hx     Social History Social History   Tobacco Use  . Smoking status: Never Smoker  . Smokeless tobacco: Never Used  Substance Use Topics  . Alcohol use: No  . Drug use: No     Allergies   Lactose intolerance (gi); Penicillins; Sulfa antibiotics; Ciprofloxacin; and Sulfasalazine   Review of Systems Review of Systems  Unable to perform ROS: Mental status change     Physical Exam Updated  Vital Signs BP 117/68 (BP Location: Right Arm)   Pulse 100   Temp 99.8 F (37.7 C) (Oral)   Resp (!) 22   SpO2 98%   Physical Exam Vitals signs and nursing note reviewed.  Constitutional:      Appearance: She is well-developed.     Comments: Warm to touch, appears lethargic  HENT:     Head: Normocephalic and atraumatic.     Mouth/Throat:     Mouth: Mucous membranes are dry.     Comments: Dry mucous membranes Eyes:     Conjunctiva/sclera: Conjunctivae normal.     Pupils: Pupils are equal, round, and reactive to light.  Neck:     Musculoskeletal: Normal range of motion.  Cardiovascular:     Rate and Rhythm: Regular rhythm. Tachycardia present.     Heart sounds: Normal heart sounds.     Comments: HR 101-105 during exam Pulmonary:     Effort: Pulmonary effort is normal.     Breath sounds: Wheezing present.     Comments: Mildly labored breathing, some wheezes noted on left, O2 sats 98% on 2L Abdominal:     General: Bowel sounds are normal.     Palpations: Abdomen is soft.  Musculoskeletal: Normal range of motion.  Skin:    General: Skin is warm and dry.  Neurological:     Mental Status: She is lethargic.     Comments: Somewhat lethargic, does arouse to loud voice, answers some questions      ED Treatments / Results  Labs (all labs ordered are listed, but only abnormal results are displayed) Labs Reviewed  COMPREHENSIVE METABOLIC PANEL - Abnormal; Notable for the following components:      Result Value   Sodium 130 (*)    Potassium 3.1 (*)    CO2 20 (*)    Glucose, Bld 60 (*)    Calcium 7.6 (*)    Total Protein 5.8 (*)    Albumin 2.7 (*)    AST 51 (*)    All other components within normal limits  PROTIME-INR - Abnormal; Notable for the following components:   Prothrombin Time 16.3 (*)    All other components within normal limits  URINALYSIS, ROUTINE W REFLEX MICROSCOPIC - Abnormal; Notable for the  following components:   Ketones, ur 5 (*)    Protein, ur 30  (*)    Leukocytes,Ua TRACE (*)    Non Squamous Epithelial 0-5 (*)    All other components within normal limits  URINE CULTURE  CULTURE, BLOOD (ROUTINE X 2)  CULTURE, BLOOD (ROUTINE X 2)  CBC WITH DIFFERENTIAL/PLATELET  LACTIC ACID, PLASMA    EKG None  Radiology Dg Chest Port 1 View  Result Date: 11/25/2018 CLINICAL DATA:  85 year old female with altered mental status. EXAM: PORTABLE CHEST 1 VIEW COMPARISON:  Chest radiograph dated 10/29/2018 FINDINGS: Mild diffuse hazy density throughout the left lung may represent atelectatic changes or asymmetric edema. Pleural effusion or pneumonia is less likely. Clinical correlation is recommended. No lobar consolidation, or pneumothorax. Stable cardiomegaly. Calcification of the mitral annulus and aortic arch. No acute osseous pathology. IMPRESSION: Mild diffuse hazy density of the left lung may represent atelectatic changes or asymmetric edema. Pneumonia is less likely. Electronically Signed   By: Anner Crete M.D.   On: 11/25/2018 23:46    Procedures Procedures (including critical care time)  Medications Ordered in ED Medications  vancomycin (VANCOCIN) 1,250 mg in sodium chloride 0.9 % 250 mL IVPB (1,250 mg Intravenous New Bag/Given 11/26/18 0129)  sodium chloride 0.9 % bolus 1,000 mL (0 mLs Intravenous Stopped 11/26/18 0022)  albuterol (PROVENTIL) (2.5 MG/3ML) 0.083% nebulizer solution 5 mg (5 mg Nebulization Given 11/26/18 0006)  acetaminophen (TYLENOL) suppository 650 mg (650 mg Rectal Given 11/26/18 0007)  ceFEPIme (MAXIPIME) 2 g in sodium chloride 0.9 % 100 mL IVPB (0 g Intravenous Stopped 11/26/18 0151)     Initial Impression / Assessment and Plan / ED Course  I have reviewed the triage vital signs and the nursing notes.  Pertinent labs & imaging results that were available during my care of the patient were reviewed by me and considered in my medical decision making (see chart for details).  85 year old female here from nursing  facility with altered mental status.  Febrile here to 102F rectally.  She does appear somewhat lethargic but arouses and is able to answer some questions.  She is spontaneously moving around.  Does have some junky breath sounds on left, O2 sats marginal with EMS and now on 2L.  Does not generally require O2.  Sepsis protocol initiated with labs, cultures, chest x-ray.  She did receive Rocephin earlier this evening for suspected UTI, has history of same and admission last month for urosepsis.  IVF and tylenol suppository given.  Labs are overall reassuring, normal white blood cell count and lactate.  UA without signs of infection.  Chest x-ray with hazy density in the left lung, questionable infectious process versus asymmetric edema.  Concern for HCAP given physical exam findings, fever, and AMS.  Start vanc/cefepime.  Will admit for ongoing care.  Discussed with Dr. Hal Hope-- will admit for ongoing care.  Final Clinical Impressions(s) / ED Diagnoses   Final diagnoses:  HCAP (healthcare-associated pneumonia)  Altered mental status, unspecified altered mental status type    ED Discharge Orders    None       Larene Pickett, PA-C 11/26/18 0224    Merrily Pew, MD 11/26/18 [phone]

## 2018-11-25 NOTE — ED Triage Notes (Signed)
Sent by ems by [facility] and rehab for AMS today.  Recently treated for sepsis from UTI.  Given 735ml NS en route.  CBG-101.  Per ems O2 sat 92% on room air.  Placed on 2 L via [place] in mouth due to being a mouth breather.  Sat now 98%.  Patient very drowsy.  Given tylenol supp by facility at 2100.

## 2018-11-26 ENCOUNTER — Encounter (HOSPITAL_COMMUNITY): Payer: Self-pay | Admitting: Internal Medicine

## 2018-11-26 ENCOUNTER — Inpatient Hospital Stay (HOSPITAL_COMMUNITY): Payer: Medicare HMO

## 2018-11-26 DIAGNOSIS — K219 Gastro-esophageal reflux disease without esophagitis: Secondary | ICD-10-CM | POA: Diagnosis present

## 2018-11-26 DIAGNOSIS — Y95 Nosocomial condition: Secondary | ICD-10-CM | POA: Diagnosis present

## 2018-11-26 DIAGNOSIS — J189 Pneumonia, unspecified organism: Secondary | ICD-10-CM | POA: Diagnosis present

## 2018-11-26 DIAGNOSIS — E871 Hypo-osmolality and hyponatremia: Secondary | ICD-10-CM | POA: Diagnosis present

## 2018-11-26 DIAGNOSIS — H409 Unspecified glaucoma: Secondary | ICD-10-CM | POA: Diagnosis present

## 2018-11-26 DIAGNOSIS — G9340 Encephalopathy, unspecified: Secondary | ICD-10-CM | POA: Diagnosis present

## 2018-11-26 DIAGNOSIS — F329 Major depressive disorder, single episode, unspecified: Secondary | ICD-10-CM | POA: Diagnosis present

## 2018-11-26 DIAGNOSIS — R55 Syncope and collapse: Secondary | ICD-10-CM | POA: Diagnosis present

## 2018-11-26 DIAGNOSIS — Z9049 Acquired absence of other specified parts of digestive tract: Secondary | ICD-10-CM | POA: Diagnosis not present

## 2018-11-26 DIAGNOSIS — E876 Hypokalemia: Secondary | ICD-10-CM | POA: Diagnosis present

## 2018-11-26 DIAGNOSIS — I4891 Unspecified atrial fibrillation: Secondary | ICD-10-CM

## 2018-11-26 DIAGNOSIS — I10 Essential (primary) hypertension: Secondary | ICD-10-CM | POA: Diagnosis present

## 2018-11-26 DIAGNOSIS — K589 Irritable bowel syndrome without diarrhea: Secondary | ICD-10-CM | POA: Diagnosis present

## 2018-11-26 DIAGNOSIS — Z7901 Long term (current) use of anticoagulants: Secondary | ICD-10-CM | POA: Diagnosis not present

## 2018-11-26 DIAGNOSIS — I340 Nonrheumatic mitral (valve) insufficiency: Secondary | ICD-10-CM | POA: Diagnosis present

## 2018-11-26 DIAGNOSIS — E11649 Type 2 diabetes mellitus with hypoglycemia without coma: Secondary | ICD-10-CM | POA: Diagnosis present

## 2018-11-26 DIAGNOSIS — D696 Thrombocytopenia, unspecified: Secondary | ICD-10-CM | POA: Diagnosis present

## 2018-11-26 DIAGNOSIS — J1000 Influenza due to other identified influenza virus with unspecified type of pneumonia: Secondary | ICD-10-CM | POA: Diagnosis present

## 2018-11-26 DIAGNOSIS — Z794 Long term (current) use of insulin: Secondary | ICD-10-CM | POA: Diagnosis not present

## 2018-11-26 DIAGNOSIS — A419 Sepsis, unspecified organism: Secondary | ICD-10-CM | POA: Diagnosis present

## 2018-11-26 DIAGNOSIS — E1140 Type 2 diabetes mellitus with diabetic neuropathy, unspecified: Secondary | ICD-10-CM | POA: Diagnosis present

## 2018-11-26 DIAGNOSIS — Z7951 Long term (current) use of inhaled steroids: Secondary | ICD-10-CM | POA: Diagnosis not present

## 2018-11-26 DIAGNOSIS — G9341 Metabolic encephalopathy: Secondary | ICD-10-CM | POA: Diagnosis present

## 2018-11-26 DIAGNOSIS — J449 Chronic obstructive pulmonary disease, unspecified: Secondary | ICD-10-CM | POA: Diagnosis not present

## 2018-11-26 DIAGNOSIS — J440 Chronic obstructive pulmonary disease with acute lower respiratory infection: Secondary | ICD-10-CM | POA: Diagnosis present

## 2018-11-26 DIAGNOSIS — G8929 Other chronic pain: Secondary | ICD-10-CM | POA: Diagnosis present

## 2018-11-26 DIAGNOSIS — Z90710 Acquired absence of both cervix and uterus: Secondary | ICD-10-CM | POA: Diagnosis not present

## 2018-11-26 DIAGNOSIS — Z87440 Personal history of urinary (tract) infections: Secondary | ICD-10-CM | POA: Diagnosis not present

## 2018-11-26 LAB — STREP PNEUMONIAE URINARY ANTIGEN: STREP PNEUMO URINARY ANTIGEN: NEGATIVE

## 2018-11-26 LAB — BLOOD GAS, ARTERIAL
Acid-base deficit: 3.4 mmol/L — ABNORMAL HIGH (ref 0.0–2.0)
Bicarbonate: 20.9 mmol/L (ref 20.0–28.0)
Drawn by: 347191
O2 Content: 3 L/min
O2 Saturation: 98.3 %
Patient temperature: 98.6
pCO2 arterial: 36.6 mmHg (ref 32.0–48.0)
pH, Arterial: 7.374 (ref 7.350–7.450)
pO2, Arterial: 125 mmHg — ABNORMAL HIGH (ref 83.0–108.0)

## 2018-11-26 LAB — COMPREHENSIVE METABOLIC PANEL
ALBUMIN: 2.7 g/dL — AB (ref 3.5–5.0)
ALT: 18 U/L (ref 0–44)
ALT: 21 U/L (ref 0–44)
AST: 51 U/L — ABNORMAL HIGH (ref 15–41)
AST: 56 U/L — ABNORMAL HIGH (ref 15–41)
Albumin: 2.8 g/dL — ABNORMAL LOW (ref 3.5–5.0)
Alkaline Phosphatase: 59 U/L (ref 38–126)
Alkaline Phosphatase: 59 U/L (ref 38–126)
Anion gap: 11 (ref 5–15)
Anion gap: 12 (ref 5–15)
BILIRUBIN TOTAL: 0.5 mg/dL (ref 0.3–1.2)
BUN: 10 mg/dL (ref 8–23)
BUN: 11 mg/dL (ref 8–23)
CO2: 20 mmol/L — ABNORMAL LOW (ref 22–32)
CO2: 23 mmol/L (ref 22–32)
Calcium: 7.6 mg/dL — ABNORMAL LOW (ref 8.9–10.3)
Calcium: 7.9 mg/dL — ABNORMAL LOW (ref 8.9–10.3)
Chloride: 100 mmol/L (ref 98–111)
Chloride: 99 mmol/L (ref 98–111)
Creatinine, Ser: 0.77 mg/dL (ref 0.44–1.00)
Creatinine, Ser: 0.79 mg/dL (ref 0.44–1.00)
GFR calc Af Amer: >60 mL/min (ref >60)
GFR calc non Af Amer: >60 mL/min (ref >60)
GFR calc non Af Amer: >60 mL/min (ref >60)
GLUCOSE: 60 mg/dL — AB (ref 70–99)
Glucose, Bld: 87 mg/dL (ref 70–99)
Potassium: 3.1 mmol/L — ABNORMAL LOW (ref 3.5–5.1)
Potassium: 3.2 mmol/L — ABNORMAL LOW (ref 3.5–5.1)
Sodium: 130 mmol/L — ABNORMAL LOW (ref 135–145)
Sodium: 135 mmol/L (ref 135–145)
Total Bilirubin: 0.5 mg/dL (ref 0.3–1.2)
Total Protein: 5.8 g/dL — ABNORMAL LOW (ref 6.5–8.1)
Total Protein: 6.1 g/dL — ABNORMAL LOW (ref 6.5–8.1)

## 2018-11-26 LAB — GLUCOSE, CAPILLARY
GLUCOSE-CAPILLARY: 154 mg/dL — AB (ref 70–99)
Glucose-Capillary: 105 mg/dL — ABNORMAL HIGH (ref 70–99)
Glucose-Capillary: 129 mg/dL — ABNORMAL HIGH (ref 70–99)
Glucose-Capillary: 133 mg/dL — ABNORMAL HIGH (ref 70–99)
Glucose-Capillary: 149 mg/dL — ABNORMAL HIGH (ref 70–99)
Glucose-Capillary: 82 mg/dL (ref 70–99)

## 2018-11-26 LAB — URINALYSIS, ROUTINE W REFLEX MICROSCOPIC
Bacteria, UA: NONE SEEN
Bilirubin Urine: NEGATIVE
GLUCOSE, UA: NEGATIVE mg/dL
Hgb urine dipstick: NEGATIVE
Ketones, ur: 5 mg/dL — AB
Nitrite: NEGATIVE
Protein, ur: 30 mg/dL — AB
Specific Gravity, Urine: 1.01 (ref 1.005–1.030)
pH: 6 (ref 5.0–8.0)

## 2018-11-26 LAB — CBC WITH DIFFERENTIAL/PLATELET
ABS IMMATURE GRANULOCYTES: 0.08 10*3/uL — AB (ref 0.00–0.07)
Abs Immature Granulocytes: 0.07 10*3/uL (ref 0.00–0.07)
BASOS ABS: 0 10*3/uL (ref 0.0–0.1)
Basophils Absolute: 0 10*3/uL (ref 0.0–0.1)
Basophils Relative: 0 %
Basophils Relative: 1 %
Eosinophils Absolute: 0 10*3/uL (ref 0.0–0.5)
Eosinophils Absolute: 0 10*3/uL (ref 0.0–0.5)
Eosinophils Relative: 0 %
Eosinophils Relative: 0 %
HCT: 38.5 % (ref 36.0–46.0)
HEMATOCRIT: 37.2 % (ref 36.0–46.0)
HEMOGLOBIN: 12.1 g/dL (ref 12.0–15.0)
Hemoglobin: 12.5 g/dL (ref 12.0–15.0)
IMMATURE GRANULOCYTES: 1 %
Immature Granulocytes: 1 %
Lymphocytes Relative: 19 %
Lymphocytes Relative: 19 %
Lymphs Abs: 1 10*3/uL (ref 0.7–4.0)
Lymphs Abs: 1.1 10*3/uL (ref 0.7–4.0)
MCH: 30.2 pg (ref 26.0–34.0)
MCH: 30.3 pg (ref 26.0–34.0)
MCHC: 32.5 g/dL (ref 30.0–36.0)
MCHC: 32.5 g/dL (ref 30.0–36.0)
MCV: 92.8 fL (ref 80.0–100.0)
MCV: 93.4 fL (ref 80.0–100.0)
Monocytes Absolute: 0.5 10*3/uL (ref 0.1–1.0)
Monocytes Absolute: 0.5 10*3/uL (ref 0.1–1.0)
Monocytes Relative: 9 %
Monocytes Relative: 8 %
NEUTROS ABS: 4.3 10*3/uL (ref 1.7–7.7)
Neutro Abs: 3.8 10*3/uL (ref 1.7–7.7)
Neutrophils Relative %: 71 %
Neutrophils Relative %: 71 %
Platelets: 159 10*3/uL (ref 150–400)
Platelets: 155 10*3/uL (ref 150–400)
RBC: 4.01 MIL/uL (ref 3.87–5.11)
RBC: 4.12 MIL/uL (ref 3.87–5.11)
RDW: 13.1 % (ref 11.5–15.5)
RDW: 13.2 % (ref 11.5–15.5)
WBC: 5.4 10*3/uL (ref 4.0–10.5)
WBC: 6.1 10*3/uL (ref 4.0–10.5)
nRBC: 0 % (ref 0.0–0.2)
nRBC: 0 % (ref 0.0–0.2)

## 2018-11-26 LAB — CBG MONITORING, ED
Glucose-Capillary: 59 mg/dL — ABNORMAL LOW (ref 70–99)
Glucose-Capillary: 221 mg/dL — ABNORMAL HIGH (ref 70–99)

## 2018-11-26 LAB — PROTIME-INR
INR: 1.33
Prothrombin Time: 16.3 seconds — ABNORMAL HIGH (ref 11.4–15.2)

## 2018-11-26 LAB — INFLUENZA PANEL BY PCR (TYPE A & B)
Influenza A By PCR: POSITIVE — AB
Influenza B By PCR: NEGATIVE

## 2018-11-26 LAB — LACTIC ACID, PLASMA: Lactic Acid, Venous: 0.8 mmol/L (ref 0.5–1.9)

## 2018-11-26 LAB — MRSA PCR SCREENING: MRSA BY PCR: NEGATIVE

## 2018-11-26 LAB — AMMONIA: Ammonia: 13 umol/L (ref 9–35)

## 2018-11-26 MED ORDER — ROPINIROLE HCL 1 MG PO TABS
0.5000 mg | ORAL_TABLET | Freq: Two times a day (BID) | ORAL | Status: DC
  Administered 2018-11-26 – 2018-11-28 (×6): 0.5 mg via ORAL
  Filled 2018-11-26 (×6): qty 1

## 2018-11-26 MED ORDER — HYDRALAZINE HCL 20 MG/ML IJ SOLN: 5.0000 mg | INTRAMUSCULAR | Status: DC | PRN

## 2018-11-26 MED ORDER — SODIUM CHLORIDE 0.9 % IV SOLN
500.0000 mg | INTRAVENOUS | Status: DC
  Administered 2018-11-26: 500 mg via INTRAVENOUS
  Filled 2018-11-26: qty 500

## 2018-11-26 MED ORDER — SODIUM CHLORIDE 0.9 % IV SOLN
2.0000 g | Freq: Once | INTRAVENOUS | Status: AC
  Administered 2018-11-26: 2 g via INTRAVENOUS
  Filled 2018-11-26: qty 2

## 2018-11-26 MED ORDER — ONDANSETRON HCL 4 MG/2ML IJ SOLN
4.0000 mg | Freq: Four times a day (QID) | INTRAMUSCULAR | Status: DC | PRN
  Administered 2018-11-26: 4 mg via INTRAVENOUS
  Filled 2018-11-26: qty 2

## 2018-11-26 MED ORDER — DEXTROSE 50 % IV SOLN
INTRAVENOUS | Status: AC
  Administered 2018-11-26: 50 mL
  Filled 2018-11-26: qty 50

## 2018-11-26 MED ORDER — DEXTROSE 50 % IV SOLN
INTRAVENOUS | Status: AC
  Filled 2018-11-26: qty 50

## 2018-11-26 MED ORDER — VANCOMYCIN HCL 10 G IV SOLR
1250.0000 mg | Freq: Once | INTRAVENOUS | Status: AC
  Administered 2018-11-26: 1250 mg via INTRAVENOUS
  Filled 2018-11-26: qty 1250

## 2018-11-26 MED ORDER — ENOXAPARIN SODIUM 100 MG/ML ~~LOC~~ SOLN
1.5000 mg/kg | SUBCUTANEOUS | Status: DC
  Administered 2018-11-26 – 2018-11-28 (×3): 90 mg via SUBCUTANEOUS
  Filled 2018-11-26 (×3): qty 1

## 2018-11-26 MED ORDER — DILTIAZEM HCL ER COATED BEADS 120 MG PO CP24
120.0000 mg | ORAL_CAPSULE | Freq: Every day | ORAL | Status: DC
  Administered 2018-11-26 – 2018-11-29 (×4): 120 mg via ORAL
  Filled 2018-11-26 (×5): qty 1

## 2018-11-26 MED ORDER — METOPROLOL TARTRATE 12.5 MG HALF TABLET
12.5000 mg | ORAL_TABLET | Freq: Two times a day (BID) | ORAL | Status: DC
  Administered 2018-11-26 – 2018-11-29 (×7): 12.5 mg via ORAL
  Filled 2018-11-26 (×7): qty 1

## 2018-11-26 MED ORDER — OSELTAMIVIR PHOSPHATE 30 MG PO CAPS
30.0000 mg | ORAL_CAPSULE | Freq: Two times a day (BID) | ORAL | Status: DC
  Administered 2018-11-26 – 2018-11-29 (×7): 30 mg via ORAL
  Filled 2018-11-26 (×8): qty 1

## 2018-11-26 MED ORDER — PANTOPRAZOLE SODIUM 40 MG PO TBEC
40.0000 mg | DELAYED_RELEASE_TABLET | Freq: Two times a day (BID) | ORAL | Status: DC
  Administered 2018-11-26 – 2018-11-29 (×7): 40 mg via ORAL
  Filled 2018-11-26 (×7): qty 1

## 2018-11-26 MED ORDER — POTASSIUM CHLORIDE CRYS ER 20 MEQ PO TBCR
40.0000 meq | EXTENDED_RELEASE_TABLET | ORAL | Status: AC
  Administered 2018-11-26: 40 meq via ORAL
  Filled 2018-11-26: qty 2

## 2018-11-26 MED ORDER — SODIUM CHLORIDE 0.9 % IV SOLN
1.0000 g | INTRAVENOUS | Status: DC
  Administered 2018-11-26: 1 g via INTRAVENOUS
  Filled 2018-11-26: qty 10

## 2018-11-26 MED ORDER — DEXTROSE 50 % IV SOLN
1.0000 | Freq: Once | INTRAVENOUS | Status: AC
  Administered 2018-11-26: 50 mL via INTRAVENOUS

## 2018-11-26 MED ORDER — ONDANSETRON HCL 4 MG PO TABS: 4.0000 mg | ORAL_TABLET | Freq: Four times a day (QID) | ORAL | Status: DC | PRN

## 2018-11-26 MED ORDER — TRAZODONE HCL 50 MG PO TABS
50.0000 mg | ORAL_TABLET | Freq: Once | ORAL | Status: AC
  Administered 2018-11-26: 50 mg via ORAL
  Filled 2018-11-26: qty 1

## 2018-11-26 MED ORDER — WARFARIN SODIUM 4 MG PO TABS
4.0000 mg | ORAL_TABLET | Freq: Once | ORAL | Status: AC
  Administered 2018-11-26: 4 mg via ORAL
  Filled 2018-11-26: qty 1

## 2018-11-26 MED ORDER — DEXTROSE 50 % IV SOLN
50.0000 mL | Freq: Once | INTRAVENOUS | Status: DC
  Filled 2018-11-26: qty 50

## 2018-11-26 MED ORDER — ACETAMINOPHEN 325 MG PO TABS
650.0000 mg | ORAL_TABLET | Freq: Four times a day (QID) | ORAL | Status: DC | PRN
  Administered 2018-11-26 – 2018-11-28 (×4): 650 mg via ORAL
  Filled 2018-11-26 (×4): qty 2

## 2018-11-26 MED ORDER — ACETAMINOPHEN 650 MG RE SUPP: 650.0000 mg | Freq: Four times a day (QID) | RECTAL | Status: DC | PRN

## 2018-11-26 MED ORDER — LATANOPROST 0.005 % OP SOLN
1.0000 [drp] | Freq: Every day | OPHTHALMIC | Status: DC
  Administered 2018-11-26 – 2018-11-28 (×3): 1 [drp] via OPHTHALMIC
  Filled 2018-11-26: qty 2.5

## 2018-11-26 MED ORDER — HEPARIN (PORCINE) 25000 UT/250ML-% IV SOLN
900.0000 [IU]/h | INTRAVENOUS | Status: DC
  Administered 2018-11-26: 900 [IU]/h via INTRAVENOUS
  Filled 2018-11-26: qty 250

## 2018-11-26 MED ORDER — WARFARIN - PHARMACIST DOSING INPATIENT
Freq: Every day | Status: DC
  Administered 2018-11-28: 17:00:00

## 2018-11-26 NOTE — ED Notes (Signed)
Lungs more coarse than on arrival.  Admitting physician made aware.  Remains on O2 at 3L.  Brother at the bedside.

## 2018-11-26 NOTE — Progress Notes (Signed)
ANTICOAGULATION CONSULT NOTE - Initial Consult  Pharmacy Consult for heparin Indication: atrial fibrillation  Allergies  Allergen Reactions  . Lactose Intolerance (Gi) Nausea And Vomiting and Other (See Comments)    severe stomach pain  . Penicillins Hives and Itching    "haven't used any since 1970's" - have gotten cephalosporins multiple times Has patient had a PCN reaction causing immediate rash, facial/tongue/throat swelling, SOB or lightheadedness with hypotension: Yes Has patient had a PCN reaction causing severe rash involving mucus membranes or skin necrosis: Unk Has patient had a PCN reaction that required hospitalization: Unk Has patient had a PCN reaction occurring within the last 10 years: No If all of the above answers are "NO", then may proceed with C  . Sulfa Antibiotics Hives and Itching    "haven't used any since 1970's"  . Ciprofloxacin Swelling    Site of swelling not recalled  . Sulfasalazine Hives and Itching    "haven't used any since 1970's"    Patient Measurements: Height: 5' (152.4 cm) Weight: 135 lb 12.9 oz (61.6 kg) IBW/kg (Calculated) : 45.5 Heparin Dosing Weight: 60kg  Vital Signs: Temp: 98.2 F (36.8 C) (02/22 0307) Temp Source: Oral (02/22 0307) BP: 113/61 (02/22 0307) Pulse Rate: 82 (02/22 0307)  Labs: Recent Labs    11/25/18 2353 11/26/18 0548  HGB 12.1 12.5  HCT 37.2 38.5  PLT 159 155  LABPROT 16.3*  --   INR 1.33  --   CREATININE 0.77 0.79    Estimated Creatinine Clearance: 41.4 mL/min (by C-G formula based on SCr of 0.79 mg/dL).   Medical History: Past Medical History:  Diagnosis Date  . Atrial fibrillation (HCC)    RVR hx.  Chronic Coumadin  . Bacteremia 10/2018  . C. difficile colitis 09/21/2013, 11/2013  . Carotid stenosis    a. Carotid US (10/2013):  bilat 1-39%; f/u 1 year  . Chronic lower back pain   . COPD (chronic obstructive pulmonary disease) ([place])   . Depression   . Diverticulosis of colon without hemorrhage    . GERD (gastroesophageal reflux disease)    with HH  . Glaucoma   . HCAP (healthcare-associated pneumonia) 11/21/2011  . High cholesterol   . Hypertension   . Metabolic encephalopathy 02/09/3219  . Mitral regurgitation 03/11/2012  . Pancreatitis    ~2008, 11/2011 post ERCP  . Pneumonia 12/2011   "first time I know about"  . Sepsis ([place]) 11/27/2013  . SIRS (systemic inflammatory response syndrome) ([place]) 03/13/2012   a/w post ERCP  pancreatitis.   . Syncope   . Type II diabetes mellitus (HCC)     Medications:  Medications Prior to Admission  Medication Sig Dispense Refill Last Dose  . acetaminophen (TYLENOL) 325 MG tablet Take 650 mg by mouth every 6 (six) hours as needed for mild pain.    11/24/2018 at Unknown time  . cefTRIAXone (ROCEPHIN) 1 g injection Inject 1 g into the muscle once.   11/25/2018 at Unknown time  . diltiazem (DILT-XR) 120 MG 24 hr capsule TAKE 1 CAPSULE(120 MG) BY MOUTH DAILY (Patient taking differently: Take 120 mg by mouth daily. ) 90 capsule 1 11/25/2018 at Unknown time  . glimepiride (AMARYL) 4 MG tablet Take 4 mg by mouth daily with breakfast.    11/25/2018 at Unknown time  . guaiFENesin (MUCINEX) 600 MG 12 hr tablet Take 600 mg by mouth 2 (two) times daily.   11/24/2018 at Unknown time  . guaiFENesin (ROBITUSSIN) 100 MG/5ML SOLN Take 5 mLs by  mouth every 6 (six) hours as needed for cough or to loosen phlegm.   11/24/2018 at Unknown time  . LANTUS SOLOSTAR 100 UNIT/ML Solostar Pen Inject 12 Units into the skin at bedtime.    11/25/2018 at Unknown time  . latanoprost (XALATAN) 0.005 % ophthalmic solution Place 1 drop into both eyes at bedtime.   11/25/2018 at Unknown time  . loperamide (IMODIUM A-D) 2 MG tablet Take 1 mg by mouth at bedtime.   11/25/2018 at Unknown time  . loperamide (LOPERAMIDE A-D) 2 MG tablet Take 1 tablet (2 mg total) by mouth as needed for diarrhea or loose stools (use at bedtime). (Patient taking differently: Take 1-2 mg by mouth at bedtime as needed for  diarrhea or loose stools. ) 30 tablet 0 11/10/2018 at Unknown time  . Melatonin 5 MG TABS Take 5 mg by mouth at bedtime.   11/25/2018 at Unknown time  . metoprolol tartrate (LOPRESSOR) 25 MG tablet Take 12.5 mg by mouth 2 (two) times daily.   11/25/2018 at 0834  . mirtazapine (REMERON) 7.5 MG tablet TAKE 1 TABLET(7.5 MG) BY MOUTH AT BEDTIME (Patient taking differently: Take 7.5 mg by mouth at bedtime. ) 30 tablet 11 11/25/2018 at Unknown time  . Multiple Vitamin (MULTIVITAMIN WITH MINERALS) TABS Take 1 tablet by mouth daily.   11/25/2018 at Unknown time  . oxybutynin (DITROPAN) 5 MG tablet Take 5 mg by mouth at bedtime.   6 11/25/2018 at Unknown time  . pantoprazole (PROTONIX) 40 MG tablet Take 1 tablet (40 mg total) by mouth 2 (two) times daily. 60 tablet 0 11/25/2018 at Unknown time  . Probiotic Product (PROBIOTIC PO) Take 1 capsule by mouth daily.   11/25/2018 at Unknown time  . promethazine (PHENERGAN) 25 MG tablet Take 25 mg by mouth 3 (three) times daily as needed for nausea or vomiting.   11/24/2018 at Unknown time  . rOPINIRole (REQUIP) 0.5 MG tablet Take 0.5 mg by mouth See admin instructions. Take 1 tablet at 4 pm and at bedtime   11/24/2018 at Unknown time  . SYMBICORT 160-4.5 MCG/ACT inhaler Inhale 2 puffs into the lungs 2 (two) times daily.    11/25/2018 at Unknown time  . warfarin (COUMADIN) 2 MG tablet Take 2 mg by mouth daily.   11/25/2018 at 1748  . warfarin (COUMADIN) 5 MG tablet Take 0.5-1 tablets (2.5-5 mg total) by mouth daily. Take 1.25mg  every Sun/Wed then 2.5 mg the rest of the week (Patient not taking: Reported on 11/26/2018)   Not Taking at Unknown time   Scheduled:  . dextrose  50 mL Intravenous Once  . dextrose      . diltiazem  120 mg Oral Daily  . latanoprost  1 drop Both Eyes QHS  . metoprolol tartrate  12.5 mg Oral BID  . pantoprazole  40 mg Oral BID  . potassium chloride  40 mEq Oral Q4H   Infusions:  . azithromycin 500 mg (11/26/18 5400)  . cefTRIAXone (ROCEPHIN)  IV       Assessment: 85yo female admitted w/ acute encephalopathy to transition from Coumadin to heparin for Afib; current INR below goal.  Goal of Therapy:  Heparin level 0.3-0.7 units/ml Monitor platelets by anticoagulation protocol: Yes   Plan:  Will begin heparin gtt at 900 units/hr and monitor heparin levels and CBC.  Wynona Neat, PharmD, BCPS  11/26/2018,7:31 AM

## 2018-11-26 NOTE — Progress Notes (Signed)
Pharmacy Antibiotic Note  [name] [name] is a 85 y.o. female admitted on 11/25/2018 with pneumonia.  Pharmacy has been consulted for cefepime and vancomycin dosing.  Plan: Vancomycin 1250 mg IV q36 hours Cefepime 2gm IV q24 hours F/u renal function, cultures and clinical course  Height: 5' (152.4 cm) Weight: 142 lb 6.7 oz (64.6 kg) IBW/kg (Calculated) : 45.5  Temp (24hrs), Avg:101 F (38.3 C), Min:99.8 F (37.7 C), Max:102.1 F (38.9 C)  No results for input(s): WBC, CREATININE, LATICACIDVEN, VANCOTROUGH, VANCOPEAK, VANCORANDOM, GENTTROUGH, GENTPEAK, GENTRANDOM, TOBRATROUGH, TOBRAPEAK, TOBRARND, AMIKACINPEAK, AMIKACINTROU, AMIKACIN in the last 168 hours.  CrCl cannot be calculated (Patient's most recent lab result is older than the maximum 21 days allowed.).    Allergies  Allergen Reactions  . Lactose Intolerance (Gi) Nausea And Vomiting and Other (See Comments)    severe stomach pain  . Penicillins Hives and Itching    "haven't used any since 1970's" - have gotten cephalosporins multiple times Has patient had a PCN reaction causing immediate rash, facial/tongue/throat swelling, SOB or lightheadedness with hypotension: Yes Has patient had a PCN reaction causing severe rash involving mucus membranes or skin necrosis: Unk Has patient had a PCN reaction that required hospitalization: Unk Has patient had a PCN reaction occurring within the last 10 years: No If all of the above answers are "NO", then may proceed with C  . Sulfa Antibiotics Hives and Itching    "haven't used any since 1970's"  . Ciprofloxacin Swelling    Site of swelling not recalled  . Sulfasalazine Hives and Itching    "haven't used any since 1970's"    Thank you for allowing pharmacy to be a part of this patient's care.  Excell Seltzer [place] 11/26/2018 12:40 AM

## 2018-11-26 NOTE — Progress Notes (Addendum)
PROGRESS NOTE    [name] [name]   OIN:[id number]  DOB: 07/30/1930  DOA: 11/25/2018 PCP: [name], MD   Brief Narrative:  [name] [name] is a 85 y.o. female with history of atrial fibrillation, diabetes mellitus type 2, COPD, hypertension who was recently admitted for sepsis secondary to UTI was eventually discharged to rehab and was able to be discharged home when patient started having fever chills and confusion. She is found to have Influenza A.    Subjective: Feels very weak. Coughing.     Assessment & Plan:   Principal Problem:   Acute encephalopathy/ Influenza A / fever/ tachycardia> sepsis - encephalopathy resolved- back to baseline - start Tamiflu- d/c Ceftriaxone and Azithromcyin  Active Problems:   Hypoglycemia in setting of DM II (diabetes mellitus, type II), controlled  - hold Insulin- cont to follow sugars    HTN (hypertension) - Atrial fibrillation  - cont Lopressor and Cardizem CD - cont Coumadin- have ordered Lovenox until INR therapeutic    COPD (chronic obstructive pulmonary disease) - stable  Time spent in minutes: 35 DVT prophylaxis: Lovenox Code Status: Full code Family Communication: brother at bedside Disposition Plan: return to SNF? Consultants:   none Procedures:   none Antimicrobials:  Anti-infectives (From admission, onward)   Start     Dose/Rate Route Frequency Ordered Stop   11/26/18 1000  oseltamivir (TAMIFLU) capsule 30 mg     30 mg Oral 2 times daily 11/26/18 0832 12/01/18 0959   11/26/18 0800  cefTRIAXone (ROCEPHIN) 1 g in sodium chloride 0.9 % 100 mL IVPB     1 g 200 mL/hr over 30 Minutes Intravenous Every 24 hours 11/26/18 0328 12/03/18 0759   11/26/18 0600  azithromycin (ZITHROMAX) 500 mg in sodium chloride 0.9 % 250 mL IVPB     500 mg 250 mL/hr over 60 Minutes Intravenous Every 24 hours 11/26/18 0328 12/03/18 0559   11/26/18 0045  ceFEPIme (MAXIPIME) 2 g in sodium chloride 0.9 % 100 mL IVPB     2 g 200  mL/hr over 30 Minutes Intravenous  Once 11/26/18 0039 11/26/18 0151   11/26/18 0045  vancomycin (VANCOCIN) 1,250 mg in sodium chloride 0.9 % 250 mL IVPB     1,250 mg 166.7 mL/hr over 90 Minutes Intravenous  Once 11/26/18 0039 11/26/18 0259       Objective: Vitals:   11/26/18 0230 11/26/18 0245 11/26/18 0307 11/26/18 0839  BP: (!) 109/58 (!) 111/46 113/61 121/66  Pulse: 90 87 82 93  Resp: 16 15 18    Temp:   98.2 F (36.8 C) 97.6 F (36.4 C)  TempSrc:   Oral Oral  SpO2: 94% 97% 96% 100%  Weight:   61.6 kg   Height:        Intake/Output Summary (Last 24 hours) at 11/26/2018 0907 Last data filed at 11/26/2018 0259 Gross per 24 hour  Intake 1350 ml  Output -  Net 1350 ml   Filed Weights   11/25/18 2326 11/26/18 0307  Weight: 64.6 kg 61.6 kg    Examination: General exam: Appears comfortable but very fatigued HEENT: PERRLA, oral mucosa moist, no sclera icterus or thrush Respiratory system: Clear to auscultation. Respiratory effort normal. Cardiovascular system: S1 & S2 heard, RRR.   Gastrointestinal system: Abdomen soft, non-tender, nondistended. Normal bowel sounds. Central nervous system: Alert and oriented. No focal neurological deficits. Extremities: No cyanosis, clubbing or edema Skin: No rashes or ulcers Psychiatry:  Mood & affect appropriate.     Data Reviewed: I have  personally reviewed following labs and imaging studies  CBC: Recent Labs  Lab 11/25/18 2353 11/26/18 0548  WBC 5.4 6.1  NEUTROABS 3.8 4.3  HGB 12.1 12.5  HCT 37.2 38.5  MCV 92.8 93.4  PLT 159 400   Basic Metabolic Panel: Recent Labs  Lab 11/25/18 2353 11/26/18 0548  NA 130* 135  K 3.1* 3.2*  CL 99 100  CO2 20* 23  GLUCOSE 60* 87  BUN 10 11  CREATININE 0.77 0.79  CALCIUM 7.6* 7.9*   GFR: Estimated Creatinine Clearance: 41.4 mL/min (by C-G formula based on SCr of 0.79 mg/dL). Liver Function Tests: Recent Labs  Lab 11/25/18 2353 11/26/18 0548  AST 51* 56*  ALT 18 21    ALKPHOS 59 59  BILITOT 0.5 0.5  PROT 5.8* 6.1*  ALBUMIN 2.7* 2.8*   No results for input(s): LIPASE, AMYLASE in the last 168 hours. Recent Labs  Lab 11/26/18 0548  AMMONIA 13   Coagulation Profile: Recent Labs  Lab 11/25/18 2353  INR 1.33   Cardiac Enzymes: No results for input(s): CKTOTAL, CKMB, CKMBINDEX, TROPONINI in the last 168 hours. BNP (last 3 results) No results for input(s): PROBNP in the last 8760 hours. HbA1C: No results for input(s): HGBA1C in the last 72 hours. CBG: Recent Labs  Lab 11/26/18 0224 11/26/18 0239 11/26/18 0419 11/26/18 0623 11/26/18 0838  GLUCAP 59* 221* 105* 82 149*   Lipid Profile: No results for input(s): CHOL, HDL, LDLCALC, TRIG, CHOLHDL, LDLDIRECT in the last 72 hours. Thyroid Function Tests: No results for input(s): TSH, T4TOTAL, FREET4, T3FREE, THYROIDAB in the last 72 hours. Anemia Panel: No results for input(s): VITAMINB12, FOLATE, FERRITIN, TIBC, IRON, RETICCTPCT in the last 72 hours. Urine analysis:    Component Value Date/Time   COLORURINE YELLOW 11/25/2018 2353   APPEARANCEUR CLEAR 11/25/2018 2353   LABSPEC 1.010 11/25/2018 2353   PHURINE 6.0 11/25/2018 2353   GLUCOSEU NEGATIVE 11/25/2018 2353   HGBUR NEGATIVE 11/25/2018 2353   BILIRUBINUR NEGATIVE 11/25/2018 2353   KETONESUR 5 (A) 11/25/2018 2353   PROTEINUR 30 (A) 11/25/2018 2353   UROBILINOGEN 0.2 11/23/2013 1431   NITRITE NEGATIVE 11/25/2018 2353   LEUKOCYTESUR TRACE (A) 11/25/2018 2353   Sepsis Labs: @LABRCNTIP (procalcitonin:4,lacticidven:4) ) Recent Results (from the past 240 hour(s))  MRSA PCR Screening     Status: None   Collection Time: 11/26/18  4:41 AM  Result Value Ref Range Status   MRSA by PCR NEGATIVE NEGATIVE Final    Comment:        The GeneXpert MRSA Assay (FDA approved for NASAL specimens only), is one component of a comprehensive MRSA colonization surveillance program. It is not intended to diagnose MRSA infection nor to guide  or monitor treatment for MRSA infections. Performed at [facility], [place] [address], [place], [place] [address]          Radiology Studies: Ct Head Wo Contrast  Result Date: 11/26/2018 CLINICAL DATA:  85 y/o F; unexplained altered level of consciousness. EXAM: CT HEAD WITHOUT CONTRAST TECHNIQUE: Contiguous axial images were obtained from the base of the skull through the vertex without intravenous contrast. COMPARISON:  05/16/2018 MRI head. 05/16/2018 CT head. FINDINGS: Brain: No evidence of acute infarction, hemorrhage, hydrocephalus, extra-axial collection or mass lesion/mass effect. Stable chronic microvascular ischemic changes and volume loss of the brain. Vascular: Calcific atherosclerosis of the carotid siphons. No hyperdense vessel identified. Skull: Normal. Negative for fracture or focal lesion. Sinuses/Orbits: Mild ethmoid sinus mucosal thickening and small fluid level within the  right sphenoid sinus. Normal aeration of mastoid air cells. Bilateral intra-ocular lens replacement. Other: None. IMPRESSION: 1. No acute intracranial abnormality identified. 2. Stable chronic microvascular ischemic changes and volume loss of the brain. 3. Paranasal sinus disease with a small sphenoid sinus fluid level which may represent acute sinusitis. Electronically Signed   By: Kristine Garbe M.D.   On: 11/26/2018 05:50   Dg Chest Port 1 View  Result Date: 11/25/2018 CLINICAL DATA:  85 year old female with altered mental status. EXAM: PORTABLE CHEST 1 VIEW COMPARISON:  Chest radiograph dated 10/29/2018 FINDINGS: Mild diffuse hazy density throughout the left lung may represent atelectatic changes or asymmetric edema. Pleural effusion or pneumonia is less likely. Clinical correlation is recommended. No lobar consolidation, or pneumothorax. Stable cardiomegaly. Calcification of the mitral annulus and aortic arch. No acute osseous pathology. IMPRESSION: Mild diffuse hazy density of the left lung  may represent atelectatic changes or asymmetric edema. Pneumonia is less likely. Electronically Signed   By: Anner Crete M.D.   On: 11/25/2018 23:46      Scheduled Meds: . dextrose  50 mL Intravenous Once  . dextrose      . diltiazem  120 mg Oral Daily  . latanoprost  1 drop Both Eyes QHS  . metoprolol tartrate  12.5 mg Oral BID  . oseltamivir  30 mg Oral BID  . pantoprazole  40 mg Oral BID  . potassium chloride  40 mEq Oral Q4H  . rOPINIRole  0.5 mg Oral BID   Continuous Infusions: . azithromycin 500 mg (11/26/18 3267)  . cefTRIAXone (ROCEPHIN)  IV    . heparin 900 Units/hr (11/26/18 0824)     LOS: 0 days      Debbe Odea, MD [facility] Pager: [email or web address] Password [facility] [place] 11/26/2018, 9:07 AM

## 2018-11-26 NOTE — H&P (Signed)
History and Physical    [name] [name] EQA:[id number] DOB: [birth year] DOA: 11/25/2018  PCP: [name], MD  Patient coming from: Home.  Chief Complaint: Altered mental status.  Fever.  History obtained from previous records ER physician and patient's brother.  HPI: [name] [name] is a 85 y.o. female with history of atrial fibrillation, diabetes mellitus type 2, COPD, hypertension who was recently admitted for sepsis secondary to UTI was eventually discharged to rehab and was able to be discharged home when patient started having fever chills and confusion yesterday.  Patient was brought to the ER but as per the patient's brother patient did not have any nausea vomiting abdominal pain or diarrhea but has been having some cough and upper respiratory tract symptoms last 3 days and patient has been also has been having similar symptoms.  ED Course: In the ER patient was found to be lethargic confused but after patient was started on fluids and antibiotics patient became more alert awake.  Patient was febrile blood cultures were obtained chest x-ray was showing some congestion concerning for fluid overload but given the symptoms of cough and upper respiratory tract symptoms was more likely pneumonia.  Patient was started on empiric antibiotics.  At the time of my exam patient became more lethargic and stat CBG was done which showed blood glucose of 50.  Patient is given D50 and admitted for further management.  Review of Systems: As per HPI, rest all negative.   Past Medical History:  Diagnosis Date  . Atrial fibrillation (HCC)    RVR hx.  Chronic Coumadin  . Bacteremia 10/2018  . C. difficile colitis 09/21/2013, 11/2013  . Carotid stenosis    a. Carotid US (10/2013):  bilat 1-39%; f/u 1 year  . Chronic lower back pain   . COPD (chronic obstructive pulmonary disease) ([place])   . Depression   . Diverticulosis of colon without hemorrhage   . GERD (gastroesophageal reflux disease)      with HH  . Glaucoma   . HCAP (healthcare-associated pneumonia) 11/21/2011  . High cholesterol   . Hypertension   . Metabolic encephalopathy 06/12/9891  . Mitral regurgitation 03/11/2012  . Pancreatitis    ~2008, 11/2011 post ERCP  . Pneumonia 12/2011   "first time I know about"  . Sepsis ([place]) 11/27/2013  . SIRS (systemic inflammatory response syndrome) ([place]) 03/13/2012   a/w post ERCP  pancreatitis.   . Syncope   . Type II diabetes mellitus ([place])     Past Surgical History:  Procedure Laterality Date  . APPENDECTOMY  ~ 1960  . CATARACT EXTRACTION W/ INTRAOCULAR LENS  IMPLANT, BILATERAL  2000's  . CHOLECYSTECTOMY  1990's   Lap chole  . COLONOSCOPY  01/2003   diverticulosis, 66mm hyperplastic sigmoid polyp  . COLONOSCOPY WITH PROPOFOL N/A 06/02/2018   Procedure: COLONOSCOPY WITH PROPOFOL;  Surgeon: Lavena Bullion, DO;  Location: [facility];  Service: Gastroenterology;  Laterality: N/A;  . DILATION AND CURETTAGE OF UTERUS    . ERCP  11/13/2011   Procedure: ENDOSCOPIC RETROGRADE CHOLANGIOPANCREATOGRAPHY (ERCP);  Surgeon: Beryle Beams, MD;  Location: [facility] ENDOSCOPY;  Service: Endoscopy;  Laterality: N/A;  . FLEXIBLE SIGMOIDOSCOPY N/A 05/02/2014   Procedure: FLEXIBLE SIGMOIDOSCOPY;  Surgeon: Gatha Mayer, MD;  Location: [facility] ENDOSCOPY;  Service: Endoscopy;  Laterality: N/A;  . HIP ARTHROPLASTY Left 01/07/2018   Procedure: ARTHROPLASTY BIPOLAR HIP (HEMIARTHROPLASTY);  Surgeon: Hiram Gash, MD;  Location: [facility];  Service: Orthopedics;  Laterality: Left;  . TONSILLECTOMY AND ADENOIDECTOMY  ~  [place]  ~ 1960's     reports that she has never smoked. She has never used smokeless tobacco. She reports that she does not drink alcohol or use drugs.  Allergies  Allergen Reactions  . Lactose Intolerance (Gi) Nausea And Vomiting and Other (See Comments)    severe stomach pain  . Penicillins Hives and Itching    "haven't used any since 1970's" - have gotten cephalosporins  multiple times Has patient had a PCN reaction causing immediate rash, facial/tongue/throat swelling, SOB or lightheadedness with hypotension: Yes Has patient had a PCN reaction causing severe rash involving mucus membranes or skin necrosis: Unk Has patient had a PCN reaction that required hospitalization: Unk Has patient had a PCN reaction occurring within the last 10 years: No If all of the above answers are "NO", then may proceed with C  . Sulfa Antibiotics Hives and Itching    "haven't used any since 1970's"  . Ciprofloxacin Swelling    Site of swelling not recalled  . Sulfasalazine Hives and Itching    "haven't used any since 1970's"    Family History  Problem Relation Age of Onset  . Stroke Father 81  . Stroke Mother 67  . Anesthesia problems Neg Hx   . Hypotension Neg Hx   . Malignant hyperthermia Neg Hx   . Pseudochol deficiency Neg Hx   . Stomach cancer Neg Hx   . Esophageal cancer Neg Hx     Prior to Admission medications   Medication Sig Start Date End Date Taking? Authorizing Provider  acetaminophen (TYLENOL) 325 MG tablet Take 650 mg by mouth every 6 (six) hours as needed for mild pain.    Yes [provider]  cefTRIAXone (ROCEPHIN) 1 g injection Inject 1 g into the muscle once.   Yes [provider]  diltiazem (DILT-XR) 120 MG 24 hr capsule TAKE 1 CAPSULE(120 MG) BY MOUTH DAILY Patient taking differently: Take 120 mg by mouth daily.  01/04/18  Yes Minus Breeding, MD  glimepiride (AMARYL) 4 MG tablet Take 4 mg by mouth daily with breakfast.    Yes [provider]  guaiFENesin (MUCINEX) 600 MG 12 hr tablet Take 600 mg by mouth 2 (two) times daily.   Yes [provider]  guaiFENesin (ROBITUSSIN) 100 MG/5ML SOLN Take 5 mLs by mouth every 6 (six) hours as needed for cough or to loosen phlegm.   Yes [provider]  LANTUS SOLOSTAR 100 UNIT/ML Solostar Pen Inject 12 Units into the skin at bedtime.  09/01/13  Yes [provider]  latanoprost (XALATAN) 0.005 % ophthalmic solution Place 1 drop into both eyes at bedtime.   Yes [provider]  loperamide (IMODIUM A-D) 2 MG tablet Take 1 mg by mouth at bedtime.   Yes [provider]  loperamide (LOPERAMIDE A-D) 2 MG tablet Take 1 tablet (2 mg total) by mouth as needed for diarrhea or loose stools (use at bedtime). Patient taking differently: Take 1-2 mg by mouth at bedtime as needed for diarrhea or loose stools.  11/27/14  Yes Gatha Mayer, MD  Melatonin 5 MG TABS Take 5 mg by mouth at bedtime.   Yes [provider]  metoprolol tartrate (LOPRESSOR) 25 MG tablet Take 12.5 mg by mouth 2 (two) times daily.   Yes [provider]  mirtazapine (REMERON) 7.5 MG tablet TAKE 1 TABLET(7.5 MG) BY MOUTH AT BEDTIME Patient taking differently: Take 7.5 mg by mouth at bedtime.  10/06/16  Yes Gatha Mayer, MD  Multiple Vitamin (MULTIVITAMIN WITH MINERALS) TABS Take 1 tablet by mouth daily.   Yes [provider]  oxybutynin (DITROPAN) 5 MG tablet Take 5 mg by mouth at bedtime.  07/29/16  Yes [provider]  pantoprazole (PROTONIX) 40 MG tablet Take 1 tablet (40 mg total) by mouth 2 (two) times daily. 06/07/18 11/27/27 Yes Arrien, Jimmy Picket, MD  Probiotic Product (PROBIOTIC PO) Take 1 capsule by mouth daily.   Yes [provider]  promethazine (PHENERGAN) 25 MG tablet Take 25 mg by mouth 3 (three) times daily as needed for nausea or vomiting.   Yes [provider]  rOPINIRole (REQUIP) 0.5 MG tablet Take 0.5 mg by mouth See admin instructions. Take 1 tablet at 4 pm and at bedtime 10/19/18  Yes [provider]  SYMBICORT 160-4.5 MCG/ACT inhaler Inhale 2 puffs into the lungs 2 (two) times daily.  03/08/14  Yes [provider]  warfarin (COUMADIN) 2 MG tablet Take 2 mg by mouth daily.   Yes [provider]  warfarin (COUMADIN) 5 MG tablet Take 0.5-1 tablets (2.5-5 mg total) by mouth  daily. Take 1.25mg  every Sun/Wed then 2.5 mg the rest of the week Patient not taking: Reported on 11/26/2018 01/12/18   Cristal Ford, DO    Physical Exam: Vitals:   11/26/18 0200 11/26/18 0230 11/26/18 0245 11/26/18 0307  BP: (!) 111/48 (!) 109/58 (!) 111/46   Pulse: 94 90 87   Resp: 14 16 15    Temp:    97.8 F (36.6 C)  TempSrc:    Axillary  SpO2: 93% 94% 97%   Weight:      Height:          Constitutional: Moderately built and nourished. Vitals:   11/26/18 0200 11/26/18 0230 11/26/18 0245 11/26/18 0307  BP: (!) 111/48 (!) 109/58 (!) 111/46   Pulse: 94 90 87   Resp: 14 16 15    Temp:    97.8 F (36.6 C)  TempSrc:    Axillary  SpO2: 93% 94% 97%   Weight:      Height:       Eyes: Anicteric no pallor. ENMT: No discharge from the ears eyes nose and mouth. Neck: No neck rigidity no mass felt. Respiratory: No rhonchi or crepitations. Cardiovascular: S1-S2 heard. Abdomen: Soft nontender bowel sounds present. Musculoskeletal: No edema.  No joint effusion. Skin: No rash. Neurologic: Patient is lethargic does not follow commands pupils are reacting to light. Psychiatric: Lethargic.   Labs on Admission: I have personally reviewed following labs and imaging studies  CBC: Recent Labs  Lab 11/25/18 2353  WBC 5.4  NEUTROABS 3.8  HGB 12.1  HCT 37.2  MCV 92.8  PLT 371   Basic Metabolic Panel: Recent Labs  Lab 11/25/18 2353  NA 130*  K 3.1*  CL 99  CO2 20*  GLUCOSE 60*  BUN 10  CREATININE 0.77  CALCIUM 7.6*   GFR: Estimated Creatinine Clearance: 42.3 mL/min (by C-G formula based on SCr of 0.77 mg/dL). Liver Function Tests: Recent Labs  Lab 11/25/18 2353  AST 51*  ALT 18  ALKPHOS 59  BILITOT 0.5  PROT 5.8*  ALBUMIN 2.7*   No results for input(s): LIPASE, AMYLASE in the last 168 hours. No results for input(s): AMMONIA in the last 168 hours. Coagulation Profile: Recent Labs  Lab 11/25/18 2353  INR 1.33   Cardiac Enzymes: No results for  input(s): CKTOTAL, CKMB, CKMBINDEX, TROPONINI in the last 168 hours.  BNP (last 3 results) No results for input(s): PROBNP in the last 8760 hours. HbA1C: No results for input(s): HGBA1C in the last 72 hours. CBG: Recent Labs  Lab 11/26/18 0224 11/26/18 0239  GLUCAP 59* 221*   Lipid Profile: No results for input(s): CHOL, HDL, LDLCALC, TRIG, CHOLHDL, LDLDIRECT in the last 72 hours. Thyroid Function Tests: No results for input(s): TSH, T4TOTAL, FREET4, T3FREE, THYROIDAB in the last 72 hours. Anemia Panel: No results for input(s): VITAMINB12, FOLATE, FERRITIN, TIBC, IRON, RETICCTPCT in the last 72 hours. Urine analysis:    Component Value Date/Time   COLORURINE YELLOW 11/25/2018 2353   APPEARANCEUR CLEAR 11/25/2018 2353   LABSPEC 1.010 11/25/2018 2353   PHURINE 6.0 11/25/2018 2353   GLUCOSEU NEGATIVE 11/25/2018 2353   HGBUR NEGATIVE 11/25/2018 2353   BILIRUBINUR NEGATIVE 11/25/2018 2353   KETONESUR 5 (A) 11/25/2018 2353   PROTEINUR 30 (A) 11/25/2018 2353   UROBILINOGEN 0.2 11/23/2013 1431   NITRITE NEGATIVE 11/25/2018 2353   LEUKOCYTESUR TRACE (A) 11/25/2018 2353   Sepsis Labs: @LABRCNTIP (procalcitonin:4,lacticidven:4) )No results found for this or any previous visit (from the past 240 hour(s)).   Radiological Exams on Admission: Dg Chest Port 1 View  Result Date: 11/25/2018 CLINICAL DATA:  85 year old female with altered mental status. EXAM: PORTABLE CHEST 1 VIEW COMPARISON:  Chest radiograph dated 10/29/2018 FINDINGS: Mild diffuse hazy density throughout the left lung may represent atelectatic changes or asymmetric edema. Pleural effusion or pneumonia is less likely. Clinical correlation is recommended. No lobar consolidation, or pneumothorax. Stable cardiomegaly. Calcification of the mitral annulus and aortic arch. No acute osseous pathology. IMPRESSION: Mild diffuse hazy density of the left lung may represent atelectatic changes or asymmetric edema. Pneumonia is less likely.  Electronically Signed   By: Anner Crete M.D.   On: 11/25/2018 23:46    EKG: Independently reviewed.  A. fib rate controlled.  Assessment/Plan Principal Problem:   Acute encephalopathy Active Problems:   DM II (diabetes mellitus, type II), controlled (HCC)   HTN (hypertension)   Atrial fibrillation    COPD (chronic obstructive pulmonary disease) (HCC)   Pneumonia    1. Acute encephalopathy with fever likely cause could be possible pneumonia.  Will check influenza PCR blood cultures urine for Legionella strep antigen presently kept on empiric antibiotics.  CT head is pending we will also check ABG.  Hypoglycemia probably contributing to her symptoms. 2. Diabetes mellitus type 2 with hypoglycemia.  Check CBGs frequently.  Hold long-acting insulin. 3. Hypertension we will keep patient on PRN IV hydralazine and start oral medications once patient is more alert awake. 4. A. Fib -patient continues to be lethargic we will start patient on heparin and hold Coumadin until patient can swallow.  Oral medication will be held if unless patient can swallow. 5. COPD not actively wheezing continue inhalers.  Check ABG. 6. Mild hyponatremia and hypokalemia replace and recheck gently hydrating.   DVT prophylaxis: Heparin. Code Status: Full code. Family Communication: Patient's brother. Disposition Plan: To be determined. Consults called: None. Admission status: Inpatient.   Rise Patience MD [facility] Pager [phone].  If 7PM-7AM, please contact night-coverage [email or web address] Password [facility]  11/26/2018, 3:28 AM

## 2018-11-26 NOTE — Progress Notes (Signed)
PHARMACY NOTE:  ANTIMICROBIAL RENAL DOSAGE ADJUSTMENT  Current antimicrobial regimen includes a mismatch between antimicrobial dosage and estimated renal function.  As per policy approved by the Pharmacy & Therapeutics and Medical Executive Committees, the antimicrobial dosage will be adjusted accordingly.  Current antimicrobial dosage:  Tamiflu 75 mg BID x 5 days  Indication:  Influenza A positive  Renal Function:  Estimated Creatinine Clearance: 41.4 mL/min (by C-G formula based on SCr of 0.79 mg/dL). []      On intermittent HD, scheduled: []      On CRRT    Antimicrobial dosage has been changed to:  Tamiflu 30 mg BID x 5 days   Thank you for allowing pharmacy to be a part of this patient's care.  Arty Baumgartner, [facility]  Pager: [phone] 11/26/2018 8:46 AM

## 2018-11-26 NOTE — Progress Notes (Signed)
ANTICOAGULATION CONSULT NOTE - Initial Consult  Pharmacy Consult for Lovenox/warfarin Indication: atrial fibrillation  Allergies  Allergen Reactions  . Lactose Intolerance (Gi) Nausea And Vomiting and Other (See Comments)    severe stomach pain  . Penicillins Hives and Itching    "haven't used any since 1970's" - have gotten cephalosporins multiple times Has patient had a PCN reaction causing immediate rash, facial/tongue/throat swelling, SOB or lightheadedness with hypotension: Yes Has patient had a PCN reaction causing severe rash involving mucus membranes or skin necrosis: Unk Has patient had a PCN reaction that required hospitalization: Unk Has patient had a PCN reaction occurring within the last 10 years: No If all of the above answers are "NO", then may proceed with C  . Sulfa Antibiotics Hives and Itching    "haven't used any since 1970's"  . Ciprofloxacin Swelling    Site of swelling not recalled  . Sulfasalazine Hives and Itching    "haven't used any since 1970's"    Patient Measurements: Height: 5' (152.4 cm) Weight: 135 lb 12.9 oz (61.6 kg) IBW/kg (Calculated) : 45.5 Heparin Dosing Weight: 60kg  Vital Signs: Temp: 97.6 F (36.4 C) (02/22 0839) Temp Source: Oral (02/22 0839) BP: 121/66 (02/22 0839) Pulse Rate: 93 (02/22 0839)  Labs: Recent Labs    11/25/18 2353 11/26/18 0548  HGB 12.1 12.5  HCT 37.2 38.5  PLT 159 155  LABPROT 16.3*  --   INR 1.33  --   CREATININE 0.77 0.79    Estimated Creatinine Clearance: 41.4 mL/min (by C-G formula based on SCr of 0.79 mg/dL).   Medical History: Past Medical History:  Diagnosis Date  . Atrial fibrillation (HCC)    RVR hx.  Chronic Coumadin  . Bacteremia 10/2018  . C. difficile colitis 09/21/2013, 11/2013  . Carotid stenosis    a. Carotid US (10/2013):  bilat 1-39%; f/u 1 year  . Chronic lower back pain   . COPD (chronic obstructive pulmonary disease) ([place])   . Depression   . Diverticulosis of colon without  hemorrhage   . GERD (gastroesophageal reflux disease)    with HH  . Glaucoma   . HCAP (healthcare-associated pneumonia) 11/21/2011  . High cholesterol   . Hypertension   . Metabolic encephalopathy 11/07/7626  . Mitral regurgitation 03/11/2012  . Pancreatitis    ~2008, 11/2011 post ERCP  . Pneumonia 12/2011   "first time I know about"  . Sepsis ([place]) 11/27/2013  . SIRS (systemic inflammatory response syndrome) ([place]) 03/13/2012   a/w post ERCP  pancreatitis.   . Syncope   . Type II diabetes mellitus (HCC)     Medications:  Medications Prior to Admission  Medication Sig Dispense Refill Last Dose  . acetaminophen (TYLENOL) 325 MG tablet Take 650 mg by mouth every 6 (six) hours as needed for mild pain.    11/24/2018 at Unknown time  . cefTRIAXone (ROCEPHIN) 1 g injection Inject 1 g into the muscle once.   11/25/2018 at Unknown time  . diltiazem (DILT-XR) 120 MG 24 hr capsule TAKE 1 CAPSULE(120 MG) BY MOUTH DAILY (Patient taking differently: Take 120 mg by mouth daily. ) 90 capsule 1 11/25/2018 at Unknown time  . glimepiride (AMARYL) 4 MG tablet Take 4 mg by mouth daily with breakfast.    11/25/2018 at Unknown time  . guaiFENesin (MUCINEX) 600 MG 12 hr tablet Take 600 mg by mouth 2 (two) times daily.   11/24/2018 at Unknown time  . guaiFENesin (ROBITUSSIN) 100 MG/5ML SOLN Take 5 mLs by  mouth every 6 (six) hours as needed for cough or to loosen phlegm.   11/24/2018 at Unknown time  . LANTUS SOLOSTAR 100 UNIT/ML Solostar Pen Inject 12 Units into the skin at bedtime.    11/25/2018 at Unknown time  . latanoprost (XALATAN) 0.005 % ophthalmic solution Place 1 drop into both eyes at bedtime.   11/25/2018 at Unknown time  . loperamide (IMODIUM A-D) 2 MG tablet Take 1 mg by mouth at bedtime.   11/25/2018 at Unknown time  . loperamide (LOPERAMIDE A-D) 2 MG tablet Take 1 tablet (2 mg total) by mouth as needed for diarrhea or loose stools (use at bedtime). (Patient taking differently: Take 1-2 mg by mouth at bedtime as  needed for diarrhea or loose stools. ) 30 tablet 0 11/10/2018 at Unknown time  . Melatonin 5 MG TABS Take 5 mg by mouth at bedtime.   11/25/2018 at Unknown time  . metoprolol tartrate (LOPRESSOR) 25 MG tablet Take 12.5 mg by mouth 2 (two) times daily.   11/25/2018 at 0834  . mirtazapine (REMERON) 7.5 MG tablet TAKE 1 TABLET(7.5 MG) BY MOUTH AT BEDTIME (Patient taking differently: Take 7.5 mg by mouth at bedtime. ) 30 tablet 11 11/25/2018 at Unknown time  . Multiple Vitamin (MULTIVITAMIN WITH MINERALS) TABS Take 1 tablet by mouth daily.   11/25/2018 at Unknown time  . oxybutynin (DITROPAN) 5 MG tablet Take 5 mg by mouth at bedtime.   6 11/25/2018 at Unknown time  . pantoprazole (PROTONIX) 40 MG tablet Take 1 tablet (40 mg total) by mouth 2 (two) times daily. 60 tablet 0 11/25/2018 at Unknown time  . Probiotic Product (PROBIOTIC PO) Take 1 capsule by mouth daily.   11/25/2018 at Unknown time  . promethazine (PHENERGAN) 25 MG tablet Take 25 mg by mouth 3 (three) times daily as needed for nausea or vomiting.   11/24/2018 at Unknown time  . rOPINIRole (REQUIP) 0.5 MG tablet Take 0.5 mg by mouth See admin instructions. Take 1 tablet at 4 pm and at bedtime   11/24/2018 at Unknown time  . SYMBICORT 160-4.5 MCG/ACT inhaler Inhale 2 puffs into the lungs 2 (two) times daily.    11/25/2018 at Unknown time  . warfarin (COUMADIN) 2 MG tablet Take 2 mg by mouth daily.   11/25/2018 at 1748  . warfarin (COUMADIN) 5 MG tablet Take 0.5-1 tablets (2.5-5 mg total) by mouth daily. Take 1.25mg  every Sun/Wed then 2.5 mg the rest of the week (Patient not taking: Reported on 11/26/2018)   Not Taking at Unknown time   Scheduled:  . dextrose  50 mL Intravenous Once  . dextrose      . diltiazem  120 mg Oral Daily  . latanoprost  1 drop Both Eyes QHS  . metoprolol tartrate  12.5 mg Oral BID  . oseltamivir  30 mg Oral BID  . pantoprazole  40 mg Oral BID  . potassium chloride  40 mEq Oral Q4H  . rOPINIRole  0.5 mg Oral BID   Infusions:   . azithromycin 500 mg (11/26/18 2595)  . cefTRIAXone (ROCEPHIN)  IV    . heparin 900 Units/hr (11/26/18 6387)    Assessment: 85yo female admitted w/ acute encephalopathy to transition from warfarin to heparin for Afib; INR on admission is SUBtherapeutic at 1.33. Drug-drug interaction with azithromycin (started 2/22).  Home warfarin dose: 2 mg daily  Goal of Therapy:  Anti-Xa level 0.6-1 units/ml 4hrs after LMWH dose given Monitor platelets by anticoagulation protocol: Yes   Plan:  D/c heparin Restart warfarin 4 mg x 1 Start Lovenox bridge 1.5 mg/kg subcutaneously q 24 hours   Thank you for allowing [place] to participate in this patients care.   Jens Som, PharmD Please utilize Amion (under [place]) for appropriate number for your unit pharmacist. 11/26/2018 9:12 AM

## 2018-11-26 NOTE — ED Provider Notes (Signed)
Medical screening examination/treatment/procedure(s) were conducted as a shared visit with non-physician practitioner(s) and myself.  I personally evaluated the patient during the encounter.  Here with altered mental status. Abnormal lung sounds. Hypoxic. Tachypneic. Febrile.  Suspect sepsis with pneumonia.  Will start abx. Already had rocephin. Admit.   EKG Interpretation  Date/Time:  Friday November 25 2018 23:14:39 EST Ventricular Rate:  95 PR Interval:    QRS Duration: 96 QT Interval:  357 QTC Calculation: 449 R Axis:   27 Text Interpretation:  Unknown rhythm, irregular rate Prolonged PR interval Borderline low voltage, extremity leads improved rate,  but  No significant change since last tracing Confirmed by Merrily Pew [phone]) on 11/26/2018 3:20:20 AM      Warda Mcqueary, Corene Cornea, MD 11/26/18 8648

## 2018-11-26 NOTE — ED Notes (Signed)
Attempted report at this time.  Nurse tied up and requested I call back in 10 minutes.

## 2018-11-27 LAB — GLUCOSE, CAPILLARY
Glucose-Capillary: 117 mg/dL — ABNORMAL HIGH (ref 70–99)
Glucose-Capillary: 102 mg/dL — ABNORMAL HIGH (ref 70–99)
Glucose-Capillary: 120 mg/dL — ABNORMAL HIGH (ref 70–99)
Glucose-Capillary: 188 mg/dL — ABNORMAL HIGH (ref 70–99)
Glucose-Capillary: 130 mg/dL — ABNORMAL HIGH (ref 70–99)

## 2018-11-27 LAB — CBC
HEMATOCRIT: 36.8 % (ref 36.0–46.0)
Hemoglobin: 11.9 g/dL — ABNORMAL LOW (ref 12.0–15.0)
MCH: 30.5 pg (ref 26.0–34.0)
MCHC: 32.3 g/dL (ref 30.0–36.0)
MCV: 94.4 fL (ref 80.0–100.0)
Platelets: 142 10*3/uL — ABNORMAL LOW (ref 150–400)
RBC: 3.9 MIL/uL (ref 3.87–5.11)
RDW: 13.4 % (ref 11.5–15.5)
WBC: 4.7 10*3/uL (ref 4.0–10.5)
nRBC: 0 % (ref 0.0–0.2)

## 2018-11-27 LAB — URINE CULTURE: Culture: NO GROWTH

## 2018-11-27 LAB — BASIC METABOLIC PANEL
Anion gap: 11 (ref 5–15)
BUN: 12 mg/dL (ref 8–23)
CO2: 19 mmol/L — ABNORMAL LOW (ref 22–32)
Calcium: 8.4 mg/dL — ABNORMAL LOW (ref 8.9–10.3)
Chloride: 103 mmol/L (ref 98–111)
Creatinine, Ser: 0.82 mg/dL (ref 0.44–1.00)
GFR calc non Af Amer: >60 mL/min (ref >60)
Glucose, Bld: 107 mg/dL — ABNORMAL HIGH (ref 70–99)
Potassium: 3.8 mmol/L (ref 3.5–5.1)
Sodium: 133 mmol/L — ABNORMAL LOW (ref 135–145)

## 2018-11-27 LAB — LEGIONELLA PNEUMOPHILA SEROGP 1 UR AG: L. pneumophila Serogp 1 Ur Ag: NEGATIVE

## 2018-11-27 MED ORDER — LOPERAMIDE HCL 2 MG PO CAPS
2.0000 mg | ORAL_CAPSULE | ORAL | Status: DC | PRN
  Administered 2018-11-27: 2 mg via ORAL
  Filled 2018-11-27: qty 1

## 2018-11-27 MED ORDER — WARFARIN SODIUM 4 MG PO TABS
4.0000 mg | ORAL_TABLET | Freq: Once | ORAL | Status: AC
  Administered 2018-11-27: 4 mg via ORAL
  Filled 2018-11-27: qty 1

## 2018-11-27 NOTE — Progress Notes (Signed)
Central telemetry called with HR 40's.  Upon assessment, pt awakens immediately, HR back up to 70's.  States she feels fine.  BP 103/54.  Provider on call Kennon Holter) notified via text page.

## 2018-11-27 NOTE — Progress Notes (Signed)
PROGRESS NOTE    [name] [name]   TJQ:[id number]  DOB: 07-27-1930  DOA: 11/25/2018 PCP: [name], MD   Brief Narrative:  [name] [name] is a 85 y.o. female with history of atrial fibrillation, diabetes mellitus type 2, COPD, hypertension who was recently admitted for sepsis secondary to UTI was eventually discharged to rehab and was able to be discharged home when patient started having fever chills and confusion. She is found to have Influenza A.    Subjective: No complaints today    Assessment & Plan:   Principal Problem:   Acute encephalopathy/ Influenza A / fever/ tachycardia> sepsis - encephalopathy resolved- back to baseline - started Tamiflu- d/c'd Ceftriaxone and Azithromcyin - she continues to progress  Active Problems:   Hypoglycemia in setting of DM II (diabetes mellitus, type II), controlled  - hold Insulin- cont to follow sugars    HTN (hypertension) - Atrial fibrillation  - cont Lopressor and Cardizem CD - cont Coumadin- have ordered Lovenox until INR therapeutic    COPD (chronic obstructive pulmonary disease) - stable  Time spent in minutes: 35 DVT prophylaxis: Lovenox Code Status: Full code Family Communication: brother at bedside Disposition Plan: return to SNF?  Obtain PT eval Consultants:   none Procedures:   none Antimicrobials:  Anti-infectives (From admission, onward)   Start     Dose/Rate Route Frequency Ordered Stop   11/26/18 1000  oseltamivir (TAMIFLU) capsule 30 mg     30 mg Oral 2 times daily 11/26/18 0832 12/01/18 0959   11/26/18 0800  cefTRIAXone (ROCEPHIN) 1 g in sodium chloride 0.9 % 100 mL IVPB  Status:  Discontinued     1 g 200 mL/hr over 30 Minutes Intravenous Every 24 hours 11/26/18 0328 11/26/18 1055   11/26/18 0600  azithromycin (ZITHROMAX) 500 mg in sodium chloride 0.9 % 250 mL IVPB  Status:  Discontinued     500 mg 250 mL/hr over 60 Minutes Intravenous Every 24 hours 11/26/18 0328 11/26/18 1055   11/26/18  0045  ceFEPIme (MAXIPIME) 2 g in sodium chloride 0.9 % 100 mL IVPB     2 g 200 mL/hr over 30 Minutes Intravenous  Once 11/26/18 0039 11/26/18 0151   11/26/18 0045  vancomycin (VANCOCIN) 1,250 mg in sodium chloride 0.9 % 250 mL IVPB     1,250 mg 166.7 mL/hr over 90 Minutes Intravenous  Once 11/26/18 0039 11/26/18 0259       Objective: Vitals:   11/26/18 2037 11/26/18 2304 11/27/18 0404 11/27/18 0747  BP: 126/67 128/68 (!) 105/58 130/61  Pulse: 89 100 73 (!) 58  Resp: (!) 22 (!) 22 16 16   Temp: 99 F (37.2 C) 98.8 F (37.1 C) 97.6 F (36.4 C) 97.6 F (36.4 C)  TempSrc: Oral Oral Oral Oral  SpO2: 99% 100% 99% 96%  Weight:      Height:        Intake/Output Summary (Last 24 hours) at 11/27/2018 0858 Last data filed at 11/26/2018 1300 Gross per 24 hour  Intake 91.09 ml  Output -  Net 91.09 ml   Filed Weights   11/25/18 2326 11/26/18 0307  Weight: 64.6 kg 61.6 kg    Examination: General exam: Appears comfortable  HEENT: PERRLA, oral mucosa moist, no sclera icterus or thrush Respiratory system: Clear to auscultation. Respiratory effort normal. Cardiovascular system: S1 & S2 heard,  No murmurs  Gastrointestinal system: Abdomen soft, non-tender, nondistended. Normal bowel sounds   Central nervous system: Alert and oriented. No focal neurological deficits. Extremities: No  cyanosis, clubbing or edema Skin: No rashes or ulcers Psychiatry:  Mood & affect appropriate.     Data Reviewed: I have personally reviewed following labs and imaging studies  CBC: Recent Labs  Lab 11/25/18 2353 11/26/18 0548 11/27/18 0311  WBC 5.4 6.1 4.7  NEUTROABS 3.8 4.3  --   HGB 12.1 12.5 11.9*  HCT 37.2 38.5 36.8  MCV 92.8 93.4 94.4  PLT 159 155 875*   Basic Metabolic Panel: Recent Labs  Lab 11/25/18 2353 11/26/18 0548 11/27/18 0311  NA 130* 135 133*  K 3.1* 3.2* 3.8  CL 99 100 103  CO2 20* 23 19*  GLUCOSE 60* 87 107*  BUN 10 11 12   CREATININE 0.77 0.79 0.82  CALCIUM 7.6*  7.9* 8.4*   GFR: Estimated Creatinine Clearance: 40.3 mL/min (by C-G formula based on SCr of 0.82 mg/dL). Liver Function Tests: Recent Labs  Lab 11/25/18 2353 11/26/18 0548  AST 51* 56*  ALT 18 21  ALKPHOS 59 59  BILITOT 0.5 0.5  PROT 5.8* 6.1*  ALBUMIN 2.7* 2.8*   No results for input(s): LIPASE, AMYLASE in the last 168 hours. Recent Labs  Lab 11/26/18 0548  AMMONIA 13   Coagulation Profile: Recent Labs  Lab 11/25/18 2353  INR 1.33   Cardiac Enzymes: No results for input(s): CKTOTAL, CKMB, CKMBINDEX, TROPONINI in the last 168 hours. BNP (last 3 results) No results for input(s): PROBNP in the last 8760 hours. HbA1C: No results for input(s): HGBA1C in the last 72 hours. CBG: Recent Labs  Lab 11/26/18 1247 11/26/18 1733 11/26/18 2331 11/27/18 0359 11/27/18 0731  GLUCAP 154* 133* 129* 117* 102*   Lipid Profile: No results for input(s): CHOL, HDL, LDLCALC, TRIG, CHOLHDL, LDLDIRECT in the last 72 hours. Thyroid Function Tests: No results for input(s): TSH, T4TOTAL, FREET4, T3FREE, THYROIDAB in the last 72 hours. Anemia Panel: No results for input(s): VITAMINB12, FOLATE, FERRITIN, TIBC, IRON, RETICCTPCT in the last 72 hours. Urine analysis:    Component Value Date/Time   COLORURINE YELLOW 11/25/2018 2353   APPEARANCEUR CLEAR 11/25/2018 2353   LABSPEC 1.010 11/25/2018 2353   PHURINE 6.0 11/25/2018 2353   GLUCOSEU NEGATIVE 11/25/2018 2353   HGBUR NEGATIVE 11/25/2018 2353   BILIRUBINUR NEGATIVE 11/25/2018 2353   KETONESUR 5 (A) 11/25/2018 2353   PROTEINUR 30 (A) 11/25/2018 2353   UROBILINOGEN 0.2 11/23/2013 1431   NITRITE NEGATIVE 11/25/2018 2353   LEUKOCYTESUR TRACE (A) 11/25/2018 2353   Sepsis Labs: @LABRCNTIP (procalcitonin:4,lacticidven:4) ) Recent Results (from the past 240 hour(s))  Blood culture (routine x 2)     Status: None (Preliminary result)   Collection Time: 11/25/18 11:26 PM  Result Value Ref Range Status   Specimen Description BLOOD RIGHT  HAND  Final   Special Requests   Final    BOTTLES DRAWN AEROBIC AND ANAEROBIC Blood Culture adequate volume   Culture NO GROWTH 1 DAY  Final   Report Status PENDING  Incomplete  Blood culture (routine x 2)     Status: None (Preliminary result)   Collection Time: 11/25/18 11:50 PM  Result Value Ref Range Status   Specimen Description BLOOD RIGHT WRIST  Final   Special Requests   Final    BOTTLES DRAWN AEROBIC AND ANAEROBIC Blood Culture adequate volume   Culture NO GROWTH 1 DAY  Final   Report Status PENDING  Incomplete  MRSA PCR Screening     Status: None   Collection Time: 11/26/18  4:41 AM  Result Value Ref Range Status   MRSA  by PCR NEGATIVE NEGATIVE Final    Comment:        The GeneXpert MRSA Assay (FDA approved for NASAL specimens only), is one component of a comprehensive MRSA colonization surveillance program. It is not intended to diagnose MRSA infection nor to guide or monitor treatment for MRSA infections. Performed at [facility], [place] [address], [place], [place] [address]          Radiology Studies: Ct Head Wo Contrast  Result Date: 11/26/2018 CLINICAL DATA:  85 y/o F; unexplained altered level of consciousness. EXAM: CT HEAD WITHOUT CONTRAST TECHNIQUE: Contiguous axial images were obtained from the base of the skull through the vertex without intravenous contrast. COMPARISON:  05/16/2018 MRI head. 05/16/2018 CT head. FINDINGS: Brain: No evidence of acute infarction, hemorrhage, hydrocephalus, extra-axial collection or mass lesion/mass effect. Stable chronic microvascular ischemic changes and volume loss of the brain. Vascular: Calcific atherosclerosis of the carotid siphons. No hyperdense vessel identified. Skull: Normal. Negative for fracture or focal lesion. Sinuses/Orbits: Mild ethmoid sinus mucosal thickening and small fluid level within the right sphenoid sinus. Normal aeration of mastoid air cells. Bilateral intra-ocular lens replacement. Other: None.  IMPRESSION: 1. No acute intracranial abnormality identified. 2. Stable chronic microvascular ischemic changes and volume loss of the brain. 3. Paranasal sinus disease with a small sphenoid sinus fluid level which may represent acute sinusitis. Electronically Signed   By: Kristine Garbe M.D.   On: 11/26/2018 05:50   Dg Chest Port 1 View  Result Date: 11/25/2018 CLINICAL DATA:  85 year old female with altered mental status. EXAM: PORTABLE CHEST 1 VIEW COMPARISON:  Chest radiograph dated 10/29/2018 FINDINGS: Mild diffuse hazy density throughout the left lung may represent atelectatic changes or asymmetric edema. Pleural effusion or pneumonia is less likely. Clinical correlation is recommended. No lobar consolidation, or pneumothorax. Stable cardiomegaly. Calcification of the mitral annulus and aortic arch. No acute osseous pathology. IMPRESSION: Mild diffuse hazy density of the left lung may represent atelectatic changes or asymmetric edema. Pneumonia is less likely. Electronically Signed   By: Anner Crete M.D.   On: 11/25/2018 23:46      Scheduled Meds: . dextrose  50 mL Intravenous Once  . diltiazem  120 mg Oral Daily  . enoxaparin (LOVENOX) injection  1.5 mg/kg Subcutaneous Q24H  . latanoprost  1 drop Both Eyes QHS  . metoprolol tartrate  12.5 mg Oral BID  . oseltamivir  30 mg Oral BID  . pantoprazole  40 mg Oral BID  . rOPINIRole  0.5 mg Oral BID  . Warfarin - Pharmacist Dosing Inpatient   Does not apply q1800   Continuous Infusions:    LOS: 1 day      Debbe Odea, MD [facility] Pager: [email or web address] Password [facility] 11/27/2018, 8:58 AM

## 2018-11-27 NOTE — Plan of Care (Signed)
Pt without anxiety this shift, calm, cooperative.

## 2018-11-27 NOTE — Progress Notes (Signed)
[place] for Lovenox/Warfarin Indication: atrial fibrillation  Allergies  Allergen Reactions  . Lactose Intolerance (Gi) Nausea And Vomiting and Other (See Comments)    severe stomach pain  . Penicillins Hives and Itching    "haven't used any since 1970's" - have gotten cephalosporins multiple times Has patient had a PCN reaction causing immediate rash, facial/tongue/throat swelling, SOB or lightheadedness with hypotension: Yes Has patient had a PCN reaction causing severe rash involving mucus membranes or skin necrosis: Unk Has patient had a PCN reaction that required hospitalization: Unk Has patient had a PCN reaction occurring within the last 10 years: No If all of the above answers are "NO", then may proceed with C  . Sulfa Antibiotics Hives and Itching    "haven't used any since 1970's"  . Ciprofloxacin Swelling    Site of swelling not recalled  . Sulfasalazine Hives and Itching    "haven't used any since 1970's"    Labs: Recent Labs    11/25/18 2353 11/26/18 0548 11/27/18 0311  HGB 12.1 12.5 11.9*  HCT 37.2 38.5 36.8  PLT 159 155 142*  LABPROT 16.3*  --   --   INR 1.33  --   --   CREATININE 0.77 0.79 0.82    Estimated Creatinine Clearance: 40.3 mL/min (by C-G formula based on SCr of 0.82 mg/dL).    Assessment: 85yo female admitted w/ acute encephalopathy to transition from warfarin to heparin for Afib; INR on admission is SUBtherapeutic at 1.33. Drug-drug interaction with azithromycin (started 2/22).  Home dose = 1.25 mg Sun/Wed, 2.5 mg rest of the wekk  Goal of Therapy:  Anti-Xa level 0.6-1 units/ml 4hrs after LMWH dose given Monitor platelets by anticoagulation protocol: Yes  INR = 2 to 3   Plan:  Repeat warfarin 4 mg x 1 Lovenox 90 mg sq Q 24 hour Follow up AM INR   Thank you  Anette Guarneri, PharmD [phone]   11/27/2018 11:04 AM

## 2018-11-27 NOTE — Evaluation (Signed)
Physical Therapy Evaluation Patient Details Name: [name] [name] MRN: [id number] DOB: 1930-05-07 Today's Date: 11/27/2018   History of Present Illness  [name] [name] is a 85 y.o. female with history of atrial fibrillation, diabetes mellitus type 2, COPD, hypertension who was recently admitted for sepsis secondary to UTI was eventually discharged to rehab and was able to be discharged home when patient started having fever chills and confusion. She is found to have Influenza A.     Clinical Impression  Pt admitted with above diagnosis. Pt currently with functional limitations due to the deficits listed below (see PT Problem List). PTA, pt living at home with husband, reports using RW for household ambulation, no home O2. Today standing EOB with instability and weakness, side stepped with premature sit. Declined walking, likely too unsteady to do so. Will need post acute rehab prior to return home.  Pt will benefit from skilled PT to increase their independence and safety with mobility to allow discharge to the venue listed below.    VSS on RA.     Follow Up Recommendations SNF;Supervision for mobility/OOB    Equipment Recommendations  None recommended by PT    Recommendations for Other Services       Precautions / Restrictions Precautions Precautions: Fall Restrictions Weight Bearing Restrictions: No      Mobility  Bed Mobility Overal bed mobility: Modified Independent Bed Mobility: Supine to Sit     Supine to sit: Supervision     General bed mobility comments: no assist, increased time  Transfers Overall transfer level: Needs assistance Equipment used: Rolling walker (2 wheeled) Transfers: Sit to/from Stand Sit to Stand: Min assist Stand pivot transfers: Min assist       General transfer comment: min A to stabilize requires walker. patient side stepping along EOB   Ambulation/Gait             General Gait Details: unsafe at this time  Stairs             Wheelchair Mobility    Modified Rankin (Stroke Patients Only)       Balance Overall balance assessment: Needs assistance Sitting-balance support: Feet supported;No upper extremity supported Sitting balance-Leahy Scale: Fair       Standing balance-Leahy Scale: Poor Standing balance comment: reliant on BUE and RW to maintain standing balance                             Pertinent Vitals/Pain Faces Pain Scale: No hurt    Home Living Family/patient expects to be discharged to:: Private residence Living Arrangements: Spouse/significant other Available Help at Discharge: Family;Available 24 hours/day Type of Home: House Home Access: Ramped entrance     Home Layout: One level Home Equipment: Walker - 4 wheels;Shower seat;Toilet riser;Grab bars - tub/shower;Hand held shower head;Wheelchair - manual      Prior Function Level of Independence: Needs assistance   Gait / Transfers Assistance Needed: ambulates with rollator but uses wheelchair for longer distances  ADL's / Homemaking Assistance Needed: sponge bath, has tub bench, husband will help with that        Hand Dominance   Dominant Hand: Right    Extremity/Trunk Assessment   Upper Extremity Assessment Upper Extremity Assessment: Generalized weakness    Lower Extremity Assessment Lower Extremity Assessment: Generalized weakness       Communication   Communication: No difficulties  Cognition Arousal/Alertness: Awake/alert Behavior During Therapy: WFL for tasks assessed/performed Overall Cognitive Status: Within  Functional Limits for tasks assessed                                        General Comments      Exercises     Assessment/Plan    PT Assessment Patient needs continued PT services  PT Problem List Decreased strength;Decreased range of motion;Decreased activity tolerance;Decreased balance;Decreased mobility;Decreased knowledge of use of DME;Decreased  knowledge of precautions;Decreased safety awareness       PT Treatment Interventions DME instruction;Gait training;Functional mobility training;Therapeutic activities;Therapeutic exercise;Balance training;Patient/family education    PT Goals (Current goals can be found in the Care Plan section)  Acute Rehab PT Goals Patient Stated Goal: get stronger and return home PT Goal Formulation: With patient Time For Goal Achievement: 12/11/18 Potential to Achieve Goals: Fair    Frequency Min 2X/week   Barriers to discharge        Co-evaluation               AM-PAC PT "6 Clicks" Mobility  Outcome Measure Help needed turning from your back to your side while in a flat bed without using bedrails?: A Little Help needed moving from lying on your back to sitting on the side of a flat bed without using bedrails?: A Little Help needed moving to and from a bed to a chair (including a wheelchair)?: A Lot Help needed standing up from a chair using your arms (e.g., wheelchair or bedside chair)?: A Lot Help needed to walk in hospital room?: A Lot Help needed climbing 3-5 steps with a railing? : A Lot 6 Click Score: 14    End of Session Equipment Utilized During Treatment: Gait belt Activity Tolerance: Patient limited by fatigue Patient left: in chair;with call bell/phone within reach;with chair alarm set;with family/visitor present Nurse Communication: Mobility status PT Visit Diagnosis: Unsteadiness on feet (R26.81);Difficulty in walking, not elsewhere classified (R26.2);History of falling (Z91.81);Dizziness and giddiness (R42)    Time: 1916-6060 PT Time Calculation (min) (ACUTE ONLY): 35 min   Charges:   PT Evaluation $PT Eval Low Complexity: 1 Low PT Treatments $Therapeutic Activity: 8-22 mins        Reinaldo Berber, PT, DPT Acute Rehabilitation Services Pager: [phone] Office: [phone]    Reinaldo Berber 11/27/2018, 12:51 PM

## 2018-11-28 DIAGNOSIS — J101 Influenza due to other identified influenza virus with other respiratory manifestations: Secondary | ICD-10-CM

## 2018-11-28 DIAGNOSIS — E1140 Type 2 diabetes mellitus with diabetic neuropathy, unspecified: Secondary | ICD-10-CM

## 2018-11-28 DIAGNOSIS — Z794 Long term (current) use of insulin: Secondary | ICD-10-CM

## 2018-11-28 DIAGNOSIS — J449 Chronic obstructive pulmonary disease, unspecified: Secondary | ICD-10-CM

## 2018-11-28 HISTORY — DX: Influenza due to other identified influenza virus with other respiratory manifestations: J10.1

## 2018-11-28 LAB — CBC
HCT: 37.6 % (ref 36.0–46.0)
Hemoglobin: 12 g/dL (ref 12.0–15.0)
MCH: 29.8 pg (ref 26.0–34.0)
MCHC: 31.9 g/dL (ref 30.0–36.0)
MCV: 93.3 fL (ref 80.0–100.0)
Platelets: 134 10*3/uL — ABNORMAL LOW (ref 150–400)
RBC: 4.03 MIL/uL (ref 3.87–5.11)
RDW: 13.2 % (ref 11.5–15.5)
WBC: 4.3 10*3/uL (ref 4.0–10.5)
nRBC: 0 % (ref 0.0–0.2)

## 2018-11-28 LAB — GLUCOSE, CAPILLARY
Glucose-Capillary: 181 mg/dL — ABNORMAL HIGH (ref 70–99)
Glucose-Capillary: 102 mg/dL — ABNORMAL HIGH (ref 70–99)
Glucose-Capillary: 114 mg/dL — ABNORMAL HIGH (ref 70–99)
Glucose-Capillary: 167 mg/dL — ABNORMAL HIGH (ref 70–99)
Glucose-Capillary: 188 mg/dL — ABNORMAL HIGH (ref 70–99)
Glucose-Capillary: 181 mg/dL — ABNORMAL HIGH (ref 70–99)

## 2018-11-28 LAB — PROTIME-INR
INR: 2.3
Prothrombin Time: 25 seconds — ABNORMAL HIGH (ref 11.4–15.2)

## 2018-11-28 MED ORDER — WARFARIN SODIUM 2 MG PO TABS
2.0000 mg | ORAL_TABLET | Freq: Once | ORAL | Status: AC
  Administered 2018-11-28: 2 mg via ORAL
  Filled 2018-11-28: qty 1

## 2018-11-28 MED ORDER — OSELTAMIVIR PHOSPHATE 30 MG PO CAPS: 30.0000 mg | ORAL_CAPSULE | Freq: Two times a day (BID) | ORAL | Status: DC

## 2018-11-28 NOTE — Discharge Instructions (Signed)

## 2018-11-28 NOTE — Progress Notes (Signed)
[facility] for Lovenox/Warfarin Indication: h/o  atrial fibrillation  Allergies  Allergen Reactions  . Lactose Intolerance (Gi) Nausea And Vomiting and Other (See Comments)    severe stomach pain  . Penicillins Hives and Itching    "haven't used any since 1970's" - have gotten cephalosporins multiple times Has patient had a PCN reaction causing immediate rash, facial/tongue/throat swelling, SOB or lightheadedness with hypotension: Yes Has patient had a PCN reaction causing severe rash involving mucus membranes or skin necrosis: Unk Has patient had a PCN reaction that required hospitalization: Unk Has patient had a PCN reaction occurring within the last 10 years: No If all of the above answers are "NO", then may proceed with C  . Sulfa Antibiotics Hives and Itching    "haven't used any since 1970's"  . Ciprofloxacin Swelling    Site of swelling not recalled  . Sulfasalazine Hives and Itching    "haven't used any since 1970's"    Labs: Recent Labs    11/25/18 2353 11/26/18 0548 11/27/18 0311 11/28/18 0246  HGB 12.1 12.5 11.9* 12.0  HCT 37.2 38.5 36.8 37.6  PLT 159 155 142* 134*  LABPROT 16.3*  --   --  25.0*  INR 1.33  --   --  2.30  CREATININE 0.77 0.79 0.82  --     Estimated Creatinine Clearance: 40.3 mL/min (by C-G formula based on SCr of 0.82 mg/dL).    Assessment: 85yo female admitted 11/25/18 w/ acute encephalopathy to transition from warfarin to Loveox for h/o Afib; INR on admission was SUBtherapeutic at 1.33. Drug-drug interaction with azithromycin (started 2/22), however received only 1 dose of 500 mg then discontinued.  Changed to Tamiflu for influenza A positive.    INR today up to 2.3 after 2 days of 4 mg doses. INR possibly effected by the 1 dose of azithromycin on 2/21 PM.  As INR is therapeutic we can discontinue the Lovenox.   Dr. Wynelle Cleveland plans to  discharge pt back to SNF today on warfarin as PTA with INR monitoring and  to f/u platelets in 3-4 days for mild thrombocytopenia.   Home dose = 2 mg daily , per [facility], last dose taken 11/25/18.   Goal of Therapy:  Anti-Xa level 0.6-1 units/ml 4hrs after LMWH dose given Monitor platelets by anticoagulation protocol: Yes  INR = 2 to 3   Plan:  Warfarin 2 mg x 1 this PM if patient remains in hospital. Discontinue lovenox Daily INR   Thank you  Nicole Cella, [place]  Please check AMION for all [place] phone numbers After 10:00 PM, call [place] 11/28/2018 9:39 AM

## 2018-11-28 NOTE — Clinical Social Work Note (Signed)
Clinical Social Work Assessment  Patient Details  Name: [name] [name] MRN: [id number] Date of Birth: Jun 08, 1930  Date of referral:  11/28/18               Reason for consult:  Facility Placement                Permission sought to share information with:  Family Supports Permission granted to share information::     Name::     [name]  Agency::  [facility]  Relationship::  Spouse  Contact Information:     Housing/Transportation Living arrangements for the past 2 months:  [place] of Information:  Patient, Spouse Patient Interpreter Needed:  None Criminal Activity/Legal Involvement Pertinent to Current Situation/Hospitalization:  No - Comment as needed Significant Relationships:  Spouse Lives with:  Facility Resident Do you feel safe going back to the place where you live?  Yes Need for family participation in patient care:  No (Coment)  Care giving concerns:  Pt is alert and oriented. Pt's spouse present at bedside.   Social Worker assessment / plan:  CSW spoke with pt and pt's spouse at bedside. Pt is from [facility] and spouse has paid already for the rest of the month. Pt is in copay days. Facility is agreeable to take pt back however, will have to submit for insurance auth in order for pt to get therapies at the facility. Pt's spouse aware. [facility] has started [place].  Employment status:  Retired Nurse, adult PT Recommendations:  [place] / Referral to community resources:  [place]  Patient/Family's Response to care:  Pt verbalized understanding of CSW role and expressed appreciation for support. Pt denies any concern regarding pt care at this time.   Patient/Family's Understanding of and Emotional Response to Diagnosis, Current Treatment, and Prognosis:  Pt understanding and realistic regarding physical limitations. Pt understands the need for SNF placement at d/c. Pt agreeable to SNF  placement at d/c, at this time. Pt's responses emotionally appropriate during conversation with CSW. Pt denies any concern regarding treatment plan at this time. CSW will continue to provide support and facilitate d/c needs.   Emotional Assessment Appearance:  Appears stated age Attitude/Demeanor/Rapport:  (pt appropriate) Affect (typically observed):  Accepting, Appropriate Orientation:  Oriented to Situation, Oriented to  Time, Oriented to Self, Oriented to Place Alcohol / Substance use:  Not Applicable Psych involvement (Current and /or in the community):  No (Comment)  Discharge Needs  Concerns to be addressed:  Care Coordination Readmission within the last 30 days:  Yes Current discharge risk:  Dependent with Mobility Barriers to Discharge:  [place], LCSW 11/28/2018, 11:54 AM

## 2018-11-28 NOTE — Clinical Social Work Note (Addendum)
Pt will need prior insurance auth in order to receive therapies from SNF. Facility has started that [place] today. Pt's spouse aware. Will need updated d/c summary on date of d/c.  Loletha Grayer, [place]

## 2018-11-28 NOTE — Discharge Summary (Addendum)
Physician Discharge Summary  [name] [name] DDU:[id number] DOB: 1930-03-30 DOA: 11/25/2018  PCP: [name], MD  Admit date: 11/25/2018 Discharge date: 11/29/2018  Admitted From: SNF Disposition:  SNF   Recommendations for Outpatient Follow-up:  1. Follow blood sugars TID AC-  currently Lantus and Amaryl are on hold 2. F/u on platelets in 3-4 days   Discharge Condition:  stable   CODE STATUS:  Full code   Diet recommendation:  Carb modified, heart healthy Consultations:  none    Discharge Diagnoses:  Principal Problem:   Acute encephalopathy Active Problems:   Influenza A   DM II (diabetes mellitus, type II), controlled (HCC)   HTN (hypertension)   Atrial fibrillation    COPD (chronic obstructive pulmonary disease) ([place])      Brief Summary: [name] [name] is a 85 y.o.femalewithhistory of atrial fibrillation, diabetes mellitus type 2, COPD, hypertension who was recently admitted for sepsis secondary to UTI was eventually discharged to rehab and was able to be discharged home when patient started having fever chills and confusion. She is found to have Influenza A.   Hospital Course:  Principal Problem:   Acute metabolic encephalopathy/ Influenza A / fever/ tachycardia> sepsis - encephalopathy resolved- back to baseline - started Tamiflu- d/c'd Ceftriaxone and Azithromcyin - she continues to progress well and is stable to d/c to SNF today  Active Problems:   Hypoglycemia in setting of DM II (diabetes mellitus, type II), controlled  - holding Insulin- cont to follow sugars    HTN (hypertension) - Atrial fibrillation  - cont Lopressor and Cardizem CD  - cont Coumadin INR is therapeutic    COPD (chronic obstructive pulmonary disease) - stable  Mild thrombocytopenia - acute- please recheck in a few days   Discharge Exam: Vitals:   11/29/18 0007 11/29/18 0754  BP: 132/62 133/74  Pulse: 68 78  Resp: 17 15  Temp: 98 F (36.7 C) 97.8 F (36.6  C)  SpO2: 95% 98%   Vitals:   11/28/18 1544 11/28/18 2021 11/29/18 0007 11/29/18 0754  BP: (!) 132/54 135/67 132/62 133/74  Pulse: 80 77 68 78  Resp:   17 15  Temp: 98.3 F (36.8 C)  98 F (36.7 C) 97.8 F (36.6 C)  TempSrc: Oral  Oral Oral  SpO2: 98%  95% 98%  Weight:      Height:        General: Pt is alert, awake, not in acute distress Cardiovascular: RRR, S1/S2 +, no rubs, no gallops Respiratory: CTA bilaterally, no wheezing, no rhonchi Abdominal: Soft, NT, ND, bowel sounds + Extremities: no edema, no cyanosis   Discharge Instructions  Discharge Instructions    Diet - low sodium heart healthy   Complete by:  As directed    Diet Carb Modified   Complete by:  As directed    Increase activity slowly   Complete by:  As directed      Allergies as of 11/29/2018      Reactions   Lactose Intolerance (gi) Nausea And Vomiting, Other (See Comments)   severe stomach pain   Penicillins Hives, Itching   "haven't used any since 1970's" - have gotten cephalosporins multiple times Has patient had a PCN reaction causing immediate rash, facial/tongue/throat swelling, SOB or lightheadedness with hypotension: Yes Has patient had a PCN reaction causing severe rash involving mucus membranes or skin necrosis: Unk Has patient had a PCN reaction that required hospitalization: Unk Has patient had a PCN reaction occurring within the last 10 years: No  If all of the above answers are "NO", then may proceed with C   Sulfa Antibiotics Hives, Itching   "haven't used any since 1970's"   Ciprofloxacin Swelling   Site of swelling not recalled   Sulfasalazine Hives, Itching   "haven't used any since 1970's"      Medication List    STOP taking these medications   cefTRIAXone 1 g injection Commonly known as:  ROCEPHIN   glimepiride 4 MG tablet Commonly known as:  AMARYL   LANTUS SOLOSTAR 100 UNIT/ML Solostar Pen Generic drug:  Insulin Glargine     TAKE these medications    acetaminophen 325 MG tablet Commonly known as:  TYLENOL Take 650 mg by mouth every 6 (six) hours as needed for mild pain.   diltiazem 120 MG 24 hr capsule Commonly known as:  DILT-XR TAKE 1 CAPSULE(120 MG) BY MOUTH DAILY What changed:    how much to take  how to take this  when to take this  additional instructions   guaiFENesin 100 MG/5ML Soln Commonly known as:  ROBITUSSIN Take 5 mLs by mouth every 6 (six) hours as needed for cough or to loosen phlegm.   guaiFENesin 600 MG 12 hr tablet Commonly known as:  MUCINEX Take 600 mg by mouth 2 (two) times daily.   latanoprost 0.005 % ophthalmic solution Commonly known as:  XALATAN Place 1 drop into both eyes at bedtime.   loperamide 2 MG tablet Commonly known as:  IMODIUM A-D Take 1 mg by mouth at bedtime. What changed:  Another medication with the same name was changed. Make sure you understand how and when to take each.   loperamide 2 MG tablet Commonly known as:  LOPERAMIDE A-D Take 1 tablet (2 mg total) by mouth as needed for diarrhea or loose stools (use at bedtime). What changed:    how much to take  when to take this  reasons to take this   Melatonin 5 MG Tabs Take 5 mg by mouth at bedtime.   metoprolol tartrate 25 MG tablet Commonly known as:  LOPRESSOR Take 12.5 mg by mouth 2 (two) times daily.   mirtazapine 7.5 MG tablet Commonly known as:  REMERON TAKE 1 TABLET(7.5 MG) BY MOUTH AT BEDTIME What changed:  See the new instructions.   multivitamin with minerals Tabs tablet Take 1 tablet by mouth daily.   oseltamivir 30 MG capsule Commonly known as:  TAMIFLU Take 1 capsule (30 mg total) by mouth 2 (two) times daily.   oxybutynin 5 MG tablet Commonly known as:  DITROPAN Take 5 mg by mouth at bedtime.   pantoprazole 40 MG tablet Commonly known as:  PROTONIX Take 1 tablet (40 mg total) by mouth 2 (two) times daily.   PROBIOTIC PO Take 1 capsule by mouth daily.   promethazine 25 MG  tablet Commonly known as:  PHENERGAN Take 25 mg by mouth 3 (three) times daily as needed for nausea or vomiting.   rOPINIRole 0.5 MG tablet Commonly known as:  REQUIP Take 0.5 mg by mouth See admin instructions. Take 1 tablet at 4 pm and at bedtime   SYMBICORT 160-4.5 MCG/ACT inhaler Generic drug:  budesonide-formoterol Inhale 2 puffs into the lungs 2 (two) times daily.   warfarin 2 MG tablet Commonly known as:  COUMADIN Take 2 mg by mouth daily.       Allergies  Allergen Reactions  . Lactose Intolerance (Gi) Nausea And Vomiting and Other (See Comments)    severe stomach pain  .  Penicillins Hives and Itching    "haven't used any since 1970's" - have gotten cephalosporins multiple times Has patient had a PCN reaction causing immediate rash, facial/tongue/throat swelling, SOB or lightheadedness with hypotension: Yes Has patient had a PCN reaction causing severe rash involving mucus membranes or skin necrosis: Unk Has patient had a PCN reaction that required hospitalization: Unk Has patient had a PCN reaction occurring within the last 10 years: No If all of the above answers are "NO", then may proceed with C  . Sulfa Antibiotics Hives and Itching    "haven't used any since 1970's"  . Ciprofloxacin Swelling    Site of swelling not recalled  . Sulfasalazine Hives and Itching    "haven't used any since 1970's"     Procedures/Studies:    Ct Head Wo Contrast  Result Date: 11/26/2018 CLINICAL DATA:  85 y/o F; unexplained altered level of consciousness. EXAM: CT HEAD WITHOUT CONTRAST TECHNIQUE: Contiguous axial images were obtained from the base of the skull through the vertex without intravenous contrast. COMPARISON:  05/16/2018 MRI head. 05/16/2018 CT head. FINDINGS: Brain: No evidence of acute infarction, hemorrhage, hydrocephalus, extra-axial collection or mass lesion/mass effect. Stable chronic microvascular ischemic changes and volume loss of the brain. Vascular: Calcific  atherosclerosis of the carotid siphons. No hyperdense vessel identified. Skull: Normal. Negative for fracture or focal lesion. Sinuses/Orbits: Mild ethmoid sinus mucosal thickening and small fluid level within the right sphenoid sinus. Normal aeration of mastoid air cells. Bilateral intra-ocular lens replacement. Other: None. IMPRESSION: 1. No acute intracranial abnormality identified. 2. Stable chronic microvascular ischemic changes and volume loss of the brain. 3. Paranasal sinus disease with a small sphenoid sinus fluid level which may represent acute sinusitis. Electronically Signed   By: Kristine Garbe M.D.   On: 11/26/2018 05:50   Dg Chest Port 1 View  Result Date: 11/25/2018 CLINICAL DATA:  85 year old female with altered mental status. EXAM: PORTABLE CHEST 1 VIEW COMPARISON:  Chest radiograph dated 10/29/2018 FINDINGS: Mild diffuse hazy density throughout the left lung may represent atelectatic changes or asymmetric edema. Pleural effusion or pneumonia is less likely. Clinical correlation is recommended. No lobar consolidation, or pneumothorax. Stable cardiomegaly. Calcification of the mitral annulus and aortic arch. No acute osseous pathology. IMPRESSION: Mild diffuse hazy density of the left lung may represent atelectatic changes or asymmetric edema. Pneumonia is less likely. Electronically Signed   By: Anner Crete M.D.   On: 11/25/2018 23:46     The results of significant diagnostics from this hospitalization (including imaging, microbiology, ancillary and laboratory) are listed below for reference.     Microbiology: Recent Results (from the past 240 hour(s))  Blood culture (routine x 2)     Status: None (Preliminary result)   Collection Time: 11/25/18 11:26 PM  Result Value Ref Range Status   Specimen Description BLOOD RIGHT HAND  Final   Special Requests   Final    BOTTLES DRAWN AEROBIC AND ANAEROBIC Blood Culture adequate volume   Culture   Final    NO GROWTH 2  DAYS Performed at [facility], [address] [place], [place] [address]    Report Status PENDING  Incomplete  Blood culture (routine x 2)     Status: None (Preliminary result)   Collection Time: 11/25/18 11:50 PM  Result Value Ref Range Status   Specimen Description BLOOD RIGHT WRIST  Final   Special Requests   Final    BOTTLES DRAWN AEROBIC AND ANAEROBIC Blood Culture adequate volume  Culture   Final    NO GROWTH 2 DAYS Performed at [facility], [place] [address] [place], [place] [address]    Report Status PENDING  Incomplete  Urine culture     Status: None   Collection Time: 11/26/18 12:15 AM  Result Value Ref Range Status   Specimen Description URINE, RANDOM  Final   Special Requests NONE  Final   Culture   Final    NO GROWTH Performed at [facility], [address] [place], [place] [address]    Report Status 11/27/2018 FINAL  Final  MRSA PCR Screening     Status: None   Collection Time: 11/26/18  4:41 AM  Result Value Ref Range Status   MRSA by PCR NEGATIVE NEGATIVE Final    Comment:        The GeneXpert MRSA Assay (FDA approved for NASAL specimens only), is one component of a comprehensive MRSA colonization surveillance program. It is not intended to diagnose MRSA infection nor to guide or monitor treatment for MRSA infections. Performed at [facility], [place] [address] [place], [place] [address]      Labs: BNP (last 3 results) Recent Labs    10/29/18 1409  BNP 735.3*   Basic Metabolic Panel: Recent Labs  Lab 11/25/18 2353 11/26/18 0548 11/27/18 0311  NA 130* 135 133*  K 3.1* 3.2* 3.8  CL 99 100 103  CO2 20* 23 19*  GLUCOSE 60* 87 107*  BUN 10 11 12   CREATININE 0.77 0.79 0.82  CALCIUM 7.6* 7.9* 8.4*   Liver Function Tests: Recent Labs  Lab 11/25/18 2353 11/26/18 0548  AST 51* 56*  ALT 18 21  ALKPHOS 59 59  BILITOT 0.5 0.5  PROT 5.8* 6.1*  ALBUMIN 2.7* 2.8*   No results for input(s): LIPASE, AMYLASE in the  last 168 hours. Recent Labs  Lab 11/26/18 0548  AMMONIA 13   CBC: Recent Labs  Lab 11/25/18 2353 11/26/18 0548 11/27/18 0311 11/28/18 0246 11/29/18 0322  WBC 5.4 6.1 4.7 4.3 4.4  NEUTROABS 3.8 4.3  --   --   --   HGB 12.1 12.5 11.9* 12.0 11.8*  HCT 37.2 38.5 36.8 37.6 35.2*  MCV 92.8 93.4 94.4 93.3 90.5  PLT 159 155 142* 134* 133*   Cardiac Enzymes: No results for input(s): CKTOTAL, CKMB, CKMBINDEX, TROPONINI in the last 168 hours. BNP: Invalid input(s): POCBNP CBG: Recent Labs  Lab 11/28/18 1542 11/28/18 2015 11/29/18 0004 11/29/18 0352 11/29/18 0754  GLUCAP 188* 181* 130* 101* 130*   D-Dimer No results for input(s): DDIMER in the last 72 hours. Hgb A1c No results for input(s): HGBA1C in the last 72 hours. Lipid Profile No results for input(s): CHOL, HDL, LDLCALC, TRIG, CHOLHDL, LDLDIRECT in the last 72 hours. Thyroid function studies No results for input(s): TSH, T4TOTAL, T3FREE, THYROIDAB in the last 72 hours.  Invalid input(s): FREET3 Anemia work up No results for input(s): VITAMINB12, FOLATE, FERRITIN, TIBC, IRON, RETICCTPCT in the last 72 hours. Urinalysis    Component Value Date/Time   COLORURINE YELLOW 11/25/2018 2353   APPEARANCEUR CLEAR 11/25/2018 2353   LABSPEC 1.010 11/25/2018 2353   PHURINE 6.0 11/25/2018 2353   GLUCOSEU NEGATIVE 11/25/2018 2353   HGBUR NEGATIVE 11/25/2018 2353   BILIRUBINUR NEGATIVE 11/25/2018 2353   KETONESUR 5 (A) 11/25/2018 2353   PROTEINUR 30 (A) 11/25/2018 2353   UROBILINOGEN 0.2 11/23/2013 1431   NITRITE NEGATIVE 11/25/2018 2353   LEUKOCYTESUR TRACE (A) 11/25/2018 2353  Sepsis Labs Invalid input(s): PROCALCITONIN,  WBC,  LACTICIDVEN Microbiology Recent Results (from the past 240 hour(s))  Blood culture (routine x 2)     Status: None (Preliminary result)   Collection Time: 11/25/18 11:26 PM  Result Value Ref Range Status   Specimen Description BLOOD RIGHT HAND  Final   Special Requests   Final    BOTTLES  DRAWN AEROBIC AND ANAEROBIC Blood Culture adequate volume   Culture   Final    NO GROWTH 2 DAYS Performed at [facility], [address] [place], [place] [address]    Report Status PENDING  Incomplete  Blood culture (routine x 2)     Status: None (Preliminary result)   Collection Time: 11/25/18 11:50 PM  Result Value Ref Range Status   Specimen Description BLOOD RIGHT WRIST  Final   Special Requests   Final    BOTTLES DRAWN AEROBIC AND ANAEROBIC Blood Culture adequate volume   Culture   Final    NO GROWTH 2 DAYS Performed at [facility], [place] [address], [place], [place] [address]    Report Status PENDING  Incomplete  Urine culture     Status: None   Collection Time: 11/26/18 12:15 AM  Result Value Ref Range Status   Specimen Description URINE, RANDOM  Final   Special Requests NONE  Final   Culture   Final    NO GROWTH Performed at [facility], [address] [place], [place] [address]    Report Status 11/27/2018 FINAL  Final  MRSA PCR Screening     Status: None   Collection Time: 11/26/18  4:41 AM  Result Value Ref Range Status   MRSA by PCR NEGATIVE NEGATIVE Final    Comment:        The GeneXpert MRSA Assay (FDA approved for NASAL specimens only), is one component of a comprehensive MRSA colonization surveillance program. It is not intended to diagnose MRSA infection nor to guide or monitor treatment for MRSA infections. Performed at [facility], [place] [address], [place],  [address]      Time coordinating discharge in minutes: 19  SIGNED:   Debbe Odea, MD  [facility] 11/29/2018, 11:05 AM Pager   If 7PM-7AM, please contact night-coverage [email or web address] Password [email or web address]

## 2018-11-28 NOTE — Plan of Care (Signed)
  Problem: Clinical Measurements: Goal: Ability to maintain clinical measurements within normal limits will improve Outcome: Progressing   Problem: Clinical Measurements: Goal: Respiratory complications will improve Outcome: Progressing   

## 2018-11-28 NOTE — NC FL2 (Signed)
[place] MEDICAID FL2 LEVEL OF CARE SCREENING TOOL     IDENTIFICATION  Patient Name: [name] [name] Birthdate: 21-Dec-1929 Sex: female Admission Date (Current Location): 11/25/2018  [facility] and [place] Number:  Herbalist and Address:  [facility] [place], [address] [place], [place] [address]      Provider Number: [id number]  Attending Physician Name and Address:  Debbe Odea, MD  Relative Name and Phone Number:       Current Level of Care: Hospital Recommended Level of Care: [place] Prior Approval Number:    Date Approved/Denied:   PASRR Number: [id number]  Discharge Plan: SNF    Current Diagnoses: Patient Active Problem List   Diagnosis Date Noted  . Influenza A 11/28/2018  . Acute encephalopathy 11/26/2018  . Bacteremia 10/26/2018  . SIRS (systemic inflammatory response syndrome) ([place]) 10/25/2018  . Hemorrhoids   . Shock ([place])   . DM neuropathy, type II diabetes mellitus ([place]) 01/07/2018  . Hip fracture ([place]) 01/06/2018  . Advance care planning   . Goals of care, counseling/discussion   . COPD (chronic obstructive pulmonary disease) ([place]) 10/28/2017  . RLQ abdominal pain 04/24/2014  . Nausea alone 04/11/2014  . Abdominal pain 08/05/2012    Class: Acute  . Irritable bowel syndrome 04/12/2012  . Chronic diarrhea 04/12/2012  . Encounter for long-term (current) use of anticoagulants 03/17/2012  . Depression 03/13/2012  . Anxiety 03/13/2012  . Mitral regurgitation 03/11/2012  . Chronic epigastric pain 01/25/2012  . Coagulopathy ([place]) 12/12/2011  . Symptomatic anemia 12/12/2011  . Atrial fibrillation    . UTI (urinary tract infection) 11/13/2011  . DM II (diabetes mellitus, type II), controlled ([place]) 11/13/2011  . HTN (hypertension) 11/13/2011  . Hypercholesteremia 11/13/2011  . Glaucoma (increased eye pressure) 11/13/2011    Orientation RESPIRATION BLADDER Height & Weight     Self, Time, Place,  Situation  O2(Nasal Cannula 2L) Incontinent, External catheter(placed 11/26/18) Weight: 135 lb 12.9 oz (61.6 kg) Height:  5' (152.4 cm)  BEHAVIORAL SYMPTOMS/MOOD NEUROLOGICAL BOWEL NUTRITION STATUS      Continent Diet(heart healthy/carb modified, thin liquids)  AMBULATORY STATUS COMMUNICATION OF NEEDS Skin   Limited Assist Verbally Normal                       Personal Care Assistance Level of Assistance  Bathing, Feeding, Dressing Bathing Assistance: Limited assistance Feeding assistance: Independent Dressing Assistance: Limited assistance     Functional Limitations Info  Sight, Hearing, Speech Sight Info: Adequate Hearing Info: Adequate Speech Info: Adequate    SPECIAL CARE FACTORS FREQUENCY  PT (By licensed PT), OT (By licensed OT)     PT Frequency: 2x OT Frequency: 2x            Contractures Contractures Info: Not present    Additional Factors Info  Code Status, Allergies, Psychotropic, Isolation Precautions Code Status Info: Full Code Allergies Info:  Lactose Intolerance (Gi), Penicillins, Sulfa Antibiotics, Ciprofloxacin, Sulfasalazine     Isolation Precautions Info: Droplet; Influenza A     Current Medications (11/28/2018):  This is the current hospital active medication list Current Facility-Administered Medications  Medication Dose Route Frequency Provider Last Rate Last Dose  . acetaminophen (TYLENOL) tablet 650 mg  650 mg Oral Q6H PRN Rise Patience, MD   650 mg at 11/28/18 0455   Or  . acetaminophen (TYLENOL) suppository 650 mg  650 mg Rectal Q6H PRN Rise Patience, MD      .  dextrose 50 % solution 50 mL  50 mL Intravenous Once Rise Patience, MD      . diltiazem (CARDIZEM CD) 24 hr capsule 120 mg  120 mg Oral Daily Rise Patience, MD   120 mg at 11/28/18 0848  . enoxaparin (LOVENOX) injection 90 mg  1.5 mg/kg Subcutaneous Q24H Debbe Odea, MD   90 mg at 11/28/18 0851  . hydrALAZINE (APRESOLINE) injection 5 mg  5 mg  Intravenous Q4H PRN Rise Patience, MD      . latanoprost (XALATAN) 0.005 % ophthalmic solution 1 drop  1 drop Both Eyes QHS Rise Patience, MD   1 drop at 11/27/18 2258  . loperamide (IMODIUM) capsule 2 mg  2 mg Oral PRN Debbe Odea, MD   2 mg at 11/27/18 1759  . metoprolol tartrate (LOPRESSOR) tablet 12.5 mg  12.5 mg Oral BID Rise Patience, MD   12.5 mg at 11/28/18 0848  . ondansetron (ZOFRAN) tablet 4 mg  4 mg Oral Q6H PRN Rise Patience, MD       Or  . ondansetron In[facility]) injection 4 mg  4 mg Intravenous Q6H PRN Rise Patience, MD   4 mg at 11/26/18 1806  . oseltamivir (TAMIFLU) capsule 30 mg  30 mg Oral BID Debbe Odea, MD   30 mg at 11/28/18 0848  . pantoprazole (PROTONIX) EC tablet 40 mg  40 mg Oral BID Rise Patience, MD   40 mg at 11/28/18 0848  . rOPINIRole (REQUIP) tablet 0.5 mg  0.5 mg Oral BID Debbe Odea, MD   0.5 mg at 11/27/18 2259  . Warfarin - Pharmacist Dosing Inpatient   Does not apply [id number] Debbe Odea, MD         Discharge Medications: Please see discharge summary for a list of discharge medications.  Relevant Imaging Results:  Relevant Lab Results:   Additional Information SS#: [id number]  Eileen Stanford, LCSW

## 2018-11-28 NOTE — Consult Note (Signed)
   [facility] CM Inpatient Consult   11/28/2018  [name] Feb 11, 1930 [id number]   Referral: Insurance plan  Chart review reveals the patient [name] [name] is a 85 y.o.femalewithhistory of atrial fibrillation, diabetes mellitus type 2, COPD, hypertension who was recently admitted for sepsis secondary to UTI was eventually discharged to rehab and was able to be discharged home when patient started having fever chills and confusion. She is found to have Influenza A.   Chart review reveals patient and family are currently seeking a skilled facility stay at [facility].   [name], RN BSN [facility] Liaison  [phone] business mobile phone Toll free office [phone]

## 2018-11-29 DIAGNOSIS — G9340 Encephalopathy, unspecified: Secondary | ICD-10-CM | POA: Diagnosis not present

## 2018-11-29 DIAGNOSIS — M6281 Muscle weakness (generalized): Secondary | ICD-10-CM | POA: Diagnosis not present

## 2018-11-29 DIAGNOSIS — R791 Abnormal coagulation profile: Secondary | ICD-10-CM | POA: Diagnosis not present

## 2018-11-29 DIAGNOSIS — E119 Type 2 diabetes mellitus without complications: Secondary | ICD-10-CM | POA: Diagnosis not present

## 2018-11-29 DIAGNOSIS — R05 Cough: Secondary | ICD-10-CM | POA: Diagnosis not present

## 2018-11-29 DIAGNOSIS — R41841 Cognitive communication deficit: Secondary | ICD-10-CM | POA: Diagnosis not present

## 2018-11-29 DIAGNOSIS — R5381 Other malaise: Secondary | ICD-10-CM | POA: Diagnosis not present

## 2018-11-29 DIAGNOSIS — M2550 Pain in unspecified joint: Secondary | ICD-10-CM | POA: Diagnosis not present

## 2018-11-29 DIAGNOSIS — R1312 Dysphagia, oropharyngeal phase: Secondary | ICD-10-CM | POA: Diagnosis not present

## 2018-11-29 DIAGNOSIS — Z7901 Long term (current) use of anticoagulants: Secondary | ICD-10-CM | POA: Diagnosis not present

## 2018-11-29 DIAGNOSIS — R6510 Systemic inflammatory response syndrome (SIRS) of non-infectious origin without acute organ dysfunction: Secondary | ICD-10-CM | POA: Diagnosis not present

## 2018-11-29 DIAGNOSIS — I480 Paroxysmal atrial fibrillation: Secondary | ICD-10-CM | POA: Diagnosis not present

## 2018-11-29 DIAGNOSIS — J438 Other emphysema: Secondary | ICD-10-CM | POA: Diagnosis not present

## 2018-11-29 DIAGNOSIS — M7989 Other specified soft tissue disorders: Secondary | ICD-10-CM | POA: Diagnosis not present

## 2018-11-29 DIAGNOSIS — K219 Gastro-esophageal reflux disease without esophagitis: Secondary | ICD-10-CM | POA: Diagnosis not present

## 2018-11-29 DIAGNOSIS — J101 Influenza due to other identified influenza virus with other respiratory manifestations: Secondary | ICD-10-CM | POA: Diagnosis not present

## 2018-11-29 DIAGNOSIS — D6949 Other primary thrombocytopenia: Secondary | ICD-10-CM | POA: Diagnosis not present

## 2018-11-29 DIAGNOSIS — Z5181 Encounter for therapeutic drug level monitoring: Secondary | ICD-10-CM | POA: Diagnosis not present

## 2018-11-29 DIAGNOSIS — M79601 Pain in right arm: Secondary | ICD-10-CM | POA: Diagnosis not present

## 2018-11-29 DIAGNOSIS — E11649 Type 2 diabetes mellitus with hypoglycemia without coma: Secondary | ICD-10-CM | POA: Diagnosis not present

## 2018-11-29 DIAGNOSIS — R2689 Other abnormalities of gait and mobility: Secondary | ICD-10-CM | POA: Diagnosis not present

## 2018-11-29 DIAGNOSIS — J984 Other disorders of lung: Secondary | ICD-10-CM | POA: Diagnosis not present

## 2018-11-29 DIAGNOSIS — R0989 Other specified symptoms and signs involving the circulatory and respiratory systems: Secondary | ICD-10-CM | POA: Diagnosis not present

## 2018-11-29 DIAGNOSIS — I10 Essential (primary) hypertension: Secondary | ICD-10-CM | POA: Diagnosis not present

## 2018-11-29 DIAGNOSIS — J449 Chronic obstructive pulmonary disease, unspecified: Secondary | ICD-10-CM | POA: Diagnosis not present

## 2018-11-29 DIAGNOSIS — Z7401 Bed confinement status: Secondary | ICD-10-CM | POA: Diagnosis not present

## 2018-11-29 LAB — CBC
HCT: 35.2 % — ABNORMAL LOW (ref 36.0–46.0)
Hemoglobin: 11.8 g/dL — ABNORMAL LOW (ref 12.0–15.0)
MCH: 30.3 pg (ref 26.0–34.0)
MCHC: 33.5 g/dL (ref 30.0–36.0)
MCV: 90.5 fL (ref 80.0–100.0)
Platelets: 133 10*3/uL — ABNORMAL LOW (ref 150–400)
RBC: 3.89 MIL/uL (ref 3.87–5.11)
RDW: 12.9 % (ref 11.5–15.5)
WBC: 4.4 10*3/uL (ref 4.0–10.5)
nRBC: 0 % (ref 0.0–0.2)

## 2018-11-29 LAB — GLUCOSE, CAPILLARY
GLUCOSE-CAPILLARY: 181 mg/dL — AB (ref 70–99)
Glucose-Capillary: 101 mg/dL — ABNORMAL HIGH (ref 70–99)
Glucose-Capillary: 130 mg/dL — ABNORMAL HIGH (ref 70–99)
Glucose-Capillary: 130 mg/dL — ABNORMAL HIGH (ref 70–99)

## 2018-11-29 LAB — PROTIME-INR
INR: 3.3 — ABNORMAL HIGH (ref 0.8–1.2)
Prothrombin Time: 32.7 seconds — ABNORMAL HIGH (ref 11.4–15.2)

## 2018-11-29 NOTE — Progress Notes (Signed)
[facility] for Warfarin Indication: h/o  atrial fibrillation  Allergies  Allergen Reactions  . Lactose Intolerance (Gi) Nausea And Vomiting and Other (See Comments)    severe stomach pain  . Penicillins Hives and Itching    "haven't used any since 1970's" - have gotten cephalosporins multiple times Has patient had a PCN reaction causing immediate rash, facial/tongue/throat swelling, SOB or lightheadedness with hypotension: Yes Has patient had a PCN reaction causing severe rash involving mucus membranes or skin necrosis: Unk Has patient had a PCN reaction that required hospitalization: Unk Has patient had a PCN reaction occurring within the last 10 years: No If all of the above answers are "NO", then may proceed with C  . Sulfa Antibiotics Hives and Itching    "haven't used any since 1970's"  . Ciprofloxacin Swelling    Site of swelling not recalled  . Sulfasalazine Hives and Itching    "haven't used any since 1970's"    Labs: Recent Labs    11/27/18 0311 11/28/18 0246 11/29/18 0322  HGB 11.9* 12.0 11.8*  HCT 36.8 37.6 35.2*  PLT 142* 134* 133*  LABPROT  --  25.0* 32.7*  INR  --  2.30 3.3*  CREATININE 0.82  --   --     Estimated Creatinine Clearance: 40.3 mL/min (by C-G formula based on SCr of 0.82 mg/dL).    Assessment: 85yo female admitted 11/25/18 w/ acute encephalopathy to transition from warfarin to Loveox for h/o Afib; INR on admission was SUBtherapeutic at 1.33. Drug-drug interaction with azithromycin (started 2/22), however received only 1 dose of 500 mg then discontinued.  Changed to Tamiflu for influenza A positive.    INR today up to 3.3 . INR possibly effected by the 1 dose of azithromycin on 2/21 PM.  Lovenox was discontinued yesterday due to INR >2 No bleeding noted.   Dr. Wynelle Cleveland plans to  discharge pt back to SNF today on warfarin as PTA with INR monitoring and to f/u platelets in 3-4 days for mild thrombocytopenia.    Home dose = 2 mg daily , per [facility] MAG, last dose taken 11/25/18.   Goal of Therapy:  Anti-Xa level 0.6-1 units/ml 4hrs after LMWH dose given Monitor platelets by anticoagulation protocol: Yes  INR = 2 to 3   Plan:  Hold Warfarin dose today. Daily INR   Thank you  Nicole Cella, [place]  Please check AMION for all [facility] phone numbers After 10:00 PM, call [place] [phone] 11/29/2018 12:41 PM

## 2018-11-29 NOTE — Clinical Social Work Placement (Signed)
   CLINICAL SOCIAL WORK PLACEMENT  NOTE  Date:  11/29/2018  Patient Details  Name: [name] [name] MRN: [id number] Date of Birth: [birth year]  Clinical Social Work is seeking post-discharge placement for this patient at the [place] level of care (*CSW will initial, date and re-position this form in  chart as items are completed):      Patient/family provided with [place] Work Department's list of facilities offering this level of care within the geographic area requested by the patient (or if unable, by the patient's family).  Yes   Patient/family informed of their freedom to choose among providers that offer the needed level of care, that participate in Medicare, Medicaid or managed care program needed by the patient, have an available bed and are willing to accept the patient.      Patient/family informed of [place]'s ownership interest in [facility] and [facility], as well as of the fact that they are under no obligation to receive care at these facilities.  PASRR submitted to EDS on       PASRR number received on 11/28/18     Existing PASRR number confirmed on 11/28/18     FL2 transmitted to all facilities in geographic area requested by pt/family on 11/28/18     FL2 transmitted to all facilities within larger geographic area on       Patient informed that his/her managed care company has contracts with or will negotiate with certain facilities, including the following:        Yes   Patient/family informed of bed offers received.  Patient [facility] bed at [facility]     Physician recommends and patient chooses bed at      Patient to be transferred to [facility] on 11/29/18.  Patient to be transferred to facility by [facility]     Patient family notified on 11/29/18 of transfer.  Name of family member notified:  [name] spouse     PHYSICIAN Please prepare priority discharge summary, including medications, Please prepare  prescriptions, Please sign FL2     Additional Comment:    _______________________________________________ Eileen Stanford, LCSW 11/29/2018, 2:51 PM

## 2018-11-29 NOTE — Care Management Important Message (Signed)
Important Message  Patient Details  Name: [name] [name] MRN: [id number] Date of Birth: Feb 12, 1930   Medicare Important Message Given:  Yes    Zenon Mayo, RN 11/29/2018, 11:40 AM

## 2018-11-29 NOTE — Progress Notes (Signed)
The patient was discharged yesterday but is still awaiting insurance authorization. I have examined her today and she remains stable for discharge. See my d/c summary from 11/28/18.   Debbe Odea, MD

## 2018-11-29 NOTE — Progress Notes (Signed)
Physical Therapy Treatment Patient Details Name: [name] [name] MRN: [id number] DOB: [birth year] Today's Date: 11/29/2018    History of Present Illness [name] [name] is a 85 y.o. female admitted on 11/23/18 with history of atrial fibrillation, diabetes mellitus type 2, COPD, hypertension who was recently admitted for sepsis secondary to UTI was eventually discharged to rehab and was able to be discharged home when patient started having fever chills and confusion. She is found to have Influenza A.     PT Comments    Pt was able to stand with RW and transfer OOB to chair with RW and one person assist.  She did not feel strong enough to attempt gait in the room.  She was able to complete a LE/UE exercise program and O2 sats remained stable on RA during mobility.  PT will continue to follow acutely for safe mobility progression  Follow Up Recommendations  SNF;Supervision for mobility/OOB     Equipment Recommendations  None recommended by PT    Recommendations for Other Services   NA     Precautions / Restrictions Precautions Precautions: Fall;Other (comment) Precaution Comments: monitor O2 sats on RA    Mobility  Bed Mobility Overal bed mobility: Needs Assistance Bed Mobility: Supine to Sit     Supine to sit: Supervision     General bed mobility comments: close supervision HOB elevated to ~40 degrees and pt heavily reliant on bed rails for leverage.   Transfers Overall transfer level: Needs assistance Equipment used: Rolling walker (2 wheeled) Transfers: Sit to/from Stand Sit to Stand: Min assist;From elevated surface Stand pivot transfers: Mod assist;From elevated surface       General transfer comment: Min assist to stand from bed to RW, verbal cues for safe RW use, mod assist to help steer RW and keep pt's balance to transfer to the chair.    Ambulation/Gait             General Gait Details: Pt did not feel up to attempting gait at this time reporting  she is too weak and unsteady.           Balance Overall balance assessment: Needs assistance Sitting-balance support: Feet supported;No upper extremity supported;Bilateral upper extremity supported Sitting balance-Leahy Scale: Fair     Standing balance support: Bilateral upper extremity supported Standing balance-Leahy Scale: Poor Standing balance comment: reliant on BUE and RW to maintain standing balance and external support from therapist.                             Cognition Arousal/Alertness: Awake/alert Behavior During Therapy: WFL for tasks assessed/performed Overall Cognitive Status: Impaired/Different from baseline Area of Impairment: Memory                     Memory: Decreased short-term memory         General Comments: Pt having difficulty recalling timeline of events, arguing with her husband about it.  At times doesn't make sense.        Exercises General Exercises - Upper Extremity Shoulder Flexion: AROM;Both;10 reps Elbow Flexion: AROM;Both;10 reps General Exercises - Lower Extremity Ankle Circles/Pumps: AROM;Both;20 reps Long Arc Quad: AROM;5 reps;10 reps Hip Flexion/Marching: AROM;Both;10 reps        Pertinent Vitals/Pain Pain Assessment: No/denies pain           PT Goals (current goals can now be found in the care plan section) Acute Rehab PT Goals Patient Stated Goal: get  stronger and return home Progress towards PT goals: Progressing toward goals    Frequency    Min 2X/week      PT Plan Current plan remains appropriate       AM-PAC PT "6 Clicks" Mobility   Outcome Measure  Help needed turning from your back to your side while in a flat bed without using bedrails?: A Little Help needed moving from lying on your back to sitting on the side of a flat bed without using bedrails?: A Little Help needed moving to and from a bed to a chair (including a wheelchair)?: A Lot Help needed standing up from a chair  using your arms (e.g., wheelchair or bedside chair)?: A Little Help needed to walk in hospital room?: A Lot Help needed climbing 3-5 steps with a railing? : Total 6 Click Score: 14    End of Session Equipment Utilized During Treatment: Gait belt Activity Tolerance: Patient limited by fatigue Patient left: in chair;with call bell/phone within reach;with family/visitor present;with chair alarm set   PT Visit Diagnosis: Unsteadiness on feet (R26.81);Difficulty in walking, not elsewhere classified (R26.2);History of falling (Z91.81);Dizziness and giddiness (R42)     Time: 8413-2440 PT Time Calculation (min) (ACUTE ONLY): 31 min  Charges:  $Therapeutic Exercise: 8-22 mins $Therapeutic Activity: 8-22 mins                    Devony Mcgrady B. Shaelin Lalley, PT, DPT  Acute Rehabilitation [phone] pager #[phone] office   11/29/2018, 2:59 PM

## 2018-11-29 NOTE — Clinical Social Work Note (Signed)
Clinical Social Worker facilitated patient discharge including contacting patient family and facility to confirm patient discharge plans.  Clinical information faxed to facility and family agreeable with plan.  CSW arranged ambulance transport via [place] to [facility] (room 1205P) .  RN to call [phone] for report prior to discharge.  Clinical Social Worker will sign off for now as social work intervention is no longer needed. Please consult [place] again if new need arises.  [place], [place]

## 2018-11-30 DIAGNOSIS — R05 Cough: Secondary | ICD-10-CM | POA: Diagnosis not present

## 2018-11-30 DIAGNOSIS — J101 Influenza due to other identified influenza virus with other respiratory manifestations: Secondary | ICD-10-CM | POA: Diagnosis not present

## 2018-11-30 DIAGNOSIS — M79601 Pain in right arm: Secondary | ICD-10-CM | POA: Diagnosis not present

## 2018-11-30 DIAGNOSIS — R791 Abnormal coagulation profile: Secondary | ICD-10-CM | POA: Diagnosis not present

## 2018-12-01 DIAGNOSIS — M7989 Other specified soft tissue disorders: Secondary | ICD-10-CM | POA: Diagnosis not present

## 2018-12-01 DIAGNOSIS — J101 Influenza due to other identified influenza virus with other respiratory manifestations: Secondary | ICD-10-CM | POA: Diagnosis not present

## 2018-12-01 DIAGNOSIS — M79601 Pain in right arm: Secondary | ICD-10-CM | POA: Diagnosis not present

## 2018-12-01 DIAGNOSIS — J984 Other disorders of lung: Secondary | ICD-10-CM | POA: Diagnosis not present

## 2018-12-01 DIAGNOSIS — D6949 Other primary thrombocytopenia: Secondary | ICD-10-CM | POA: Diagnosis not present

## 2018-12-01 DIAGNOSIS — E11649 Type 2 diabetes mellitus with hypoglycemia without coma: Secondary | ICD-10-CM | POA: Diagnosis not present

## 2018-12-01 DIAGNOSIS — R791 Abnormal coagulation profile: Secondary | ICD-10-CM | POA: Diagnosis not present

## 2018-12-01 DIAGNOSIS — I480 Paroxysmal atrial fibrillation: Secondary | ICD-10-CM | POA: Diagnosis not present

## 2018-12-01 DIAGNOSIS — R0989 Other specified symptoms and signs involving the circulatory and respiratory systems: Secondary | ICD-10-CM | POA: Diagnosis not present

## 2018-12-01 LAB — CULTURE, BLOOD (ROUTINE X 2)
CULTURE: NO GROWTH
Culture: NO GROWTH
SPECIAL REQUESTS: ADEQUATE
Special Requests: ADEQUATE

## 2018-12-06 DIAGNOSIS — J101 Influenza due to other identified influenza virus with other respiratory manifestations: Secondary | ICD-10-CM | POA: Diagnosis not present

## 2018-12-06 DIAGNOSIS — J984 Other disorders of lung: Secondary | ICD-10-CM | POA: Diagnosis not present

## 2018-12-06 DIAGNOSIS — E119 Type 2 diabetes mellitus without complications: Secondary | ICD-10-CM | POA: Diagnosis not present

## 2018-12-06 DIAGNOSIS — R791 Abnormal coagulation profile: Secondary | ICD-10-CM | POA: Diagnosis not present

## 2018-12-06 DIAGNOSIS — I480 Paroxysmal atrial fibrillation: Secondary | ICD-10-CM | POA: Diagnosis not present

## 2018-12-06 DIAGNOSIS — I10 Essential (primary) hypertension: Secondary | ICD-10-CM | POA: Diagnosis not present

## 2018-12-06 DIAGNOSIS — J438 Other emphysema: Secondary | ICD-10-CM | POA: Diagnosis not present

## 2018-12-06 DIAGNOSIS — K219 Gastro-esophageal reflux disease without esophagitis: Secondary | ICD-10-CM | POA: Diagnosis not present

## 2018-12-07 DIAGNOSIS — I10 Essential (primary) hypertension: Secondary | ICD-10-CM | POA: Diagnosis not present

## 2018-12-07 DIAGNOSIS — Z7901 Long term (current) use of anticoagulants: Secondary | ICD-10-CM | POA: Diagnosis not present

## 2018-12-07 DIAGNOSIS — Z5181 Encounter for therapeutic drug level monitoring: Secondary | ICD-10-CM | POA: Diagnosis not present

## 2018-12-08 DIAGNOSIS — K219 Gastro-esophageal reflux disease without esophagitis: Secondary | ICD-10-CM | POA: Diagnosis not present

## 2018-12-08 DIAGNOSIS — F329 Major depressive disorder, single episode, unspecified: Secondary | ICD-10-CM | POA: Diagnosis not present

## 2018-12-08 DIAGNOSIS — D6489 Other specified anemias: Secondary | ICD-10-CM | POA: Diagnosis not present

## 2018-12-08 DIAGNOSIS — I700 Atherosclerosis of aorta: Secondary | ICD-10-CM | POA: Diagnosis not present

## 2018-12-08 DIAGNOSIS — I10 Essential (primary) hypertension: Secondary | ICD-10-CM | POA: Diagnosis not present

## 2018-12-08 DIAGNOSIS — F3289 Other specified depressive episodes: Secondary | ICD-10-CM | POA: Diagnosis not present

## 2018-12-08 DIAGNOSIS — I480 Paroxysmal atrial fibrillation: Secondary | ICD-10-CM | POA: Diagnosis not present

## 2018-12-08 DIAGNOSIS — E119 Type 2 diabetes mellitus without complications: Secondary | ICD-10-CM | POA: Diagnosis not present

## 2018-12-08 DIAGNOSIS — J438 Other emphysema: Secondary | ICD-10-CM | POA: Diagnosis not present

## 2018-12-08 DIAGNOSIS — D649 Anemia, unspecified: Secondary | ICD-10-CM | POA: Diagnosis not present

## 2018-12-08 DIAGNOSIS — R1312 Dysphagia, oropharyngeal phase: Secondary | ICD-10-CM | POA: Diagnosis not present

## 2018-12-12 ENCOUNTER — Ambulatory Visit: Payer: Medicare HMO | Admitting: Podiatry

## 2018-12-12 DIAGNOSIS — B351 Tinea unguium: Secondary | ICD-10-CM | POA: Diagnosis not present

## 2018-12-12 DIAGNOSIS — G629 Polyneuropathy, unspecified: Secondary | ICD-10-CM

## 2018-12-12 DIAGNOSIS — M79676 Pain in unspecified toe(s): Secondary | ICD-10-CM

## 2018-12-13 DIAGNOSIS — R1312 Dysphagia, oropharyngeal phase: Secondary | ICD-10-CM | POA: Diagnosis not present

## 2018-12-13 DIAGNOSIS — I10 Essential (primary) hypertension: Secondary | ICD-10-CM | POA: Diagnosis not present

## 2018-12-13 DIAGNOSIS — F329 Major depressive disorder, single episode, unspecified: Secondary | ICD-10-CM | POA: Diagnosis not present

## 2018-12-13 DIAGNOSIS — D649 Anemia, unspecified: Secondary | ICD-10-CM | POA: Diagnosis not present

## 2018-12-13 DIAGNOSIS — E119 Type 2 diabetes mellitus without complications: Secondary | ICD-10-CM | POA: Diagnosis not present

## 2018-12-13 DIAGNOSIS — K219 Gastro-esophageal reflux disease without esophagitis: Secondary | ICD-10-CM | POA: Diagnosis not present

## 2018-12-13 DIAGNOSIS — I700 Atherosclerosis of aorta: Secondary | ICD-10-CM | POA: Diagnosis not present

## 2018-12-13 DIAGNOSIS — J438 Other emphysema: Secondary | ICD-10-CM | POA: Diagnosis not present

## 2018-12-13 DIAGNOSIS — I480 Paroxysmal atrial fibrillation: Secondary | ICD-10-CM | POA: Diagnosis not present

## 2018-12-14 DIAGNOSIS — R1312 Dysphagia, oropharyngeal phase: Secondary | ICD-10-CM | POA: Diagnosis not present

## 2018-12-14 DIAGNOSIS — E119 Type 2 diabetes mellitus without complications: Secondary | ICD-10-CM | POA: Diagnosis not present

## 2018-12-14 DIAGNOSIS — K219 Gastro-esophageal reflux disease without esophagitis: Secondary | ICD-10-CM | POA: Diagnosis not present

## 2018-12-14 DIAGNOSIS — I10 Essential (primary) hypertension: Secondary | ICD-10-CM | POA: Diagnosis not present

## 2018-12-14 DIAGNOSIS — F329 Major depressive disorder, single episode, unspecified: Secondary | ICD-10-CM | POA: Diagnosis not present

## 2018-12-14 DIAGNOSIS — I700 Atherosclerosis of aorta: Secondary | ICD-10-CM | POA: Diagnosis not present

## 2018-12-14 DIAGNOSIS — J438 Other emphysema: Secondary | ICD-10-CM | POA: Diagnosis not present

## 2018-12-14 DIAGNOSIS — I480 Paroxysmal atrial fibrillation: Secondary | ICD-10-CM | POA: Diagnosis not present

## 2018-12-14 DIAGNOSIS — D649 Anemia, unspecified: Secondary | ICD-10-CM | POA: Diagnosis not present

## 2018-12-14 NOTE — Progress Notes (Signed)
Patient ID: [name] [name], female   DOB: 08/14/1930, 85 y.o.   MRN: [id number]  Subjective: 85 y.o.-year-old female returns the office today for painful, elongated, thickened toenails which she is unable to trim herself. Denies any redness or drainage around the nails and she has no new concerns today. Denies any acute changes since last appointment and no new complaints today. Denies any systemic complaints such as fevers, chills, nausea, vomiting.   Objective: NAD; presents with her husband who also has no new concerns. DP/PT pulses 1/4 bilaterally, CRT less than 3 seconds Protective sensation decreased with Simms Weinstein monofilament Nails hypertrophic, dystrophic, elongated, brittle, discolored 10. There is tenderness overlying the nails 1-5 bilaterally. There is no surrounding erythema or drainage along the nail sites. No open lesions or pre-ulcerative lesions are identified. No pain with calf compression, swelling, warmth, erythema.  Assessment: Patient presents with symptomatic onychomycosis  Plan: -Treatment options including alternatives, risks, complications were discussed -Nails sharply debrided 10 without complication/bleeding. -Discussed daily foot inspection. If there are any changes, to call the office immediately.  -Follow-up in 3 months or sooner if any problems are to arise. In the meantime, encouraged to call the office with any questions, concerns, changes symptoms.  Celesta Gentile, DPM

## 2018-12-15 DIAGNOSIS — Z961 Presence of intraocular lens: Secondary | ICD-10-CM | POA: Diagnosis not present

## 2018-12-15 DIAGNOSIS — H353132 Nonexudative age-related macular degeneration, bilateral, intermediate dry stage: Secondary | ICD-10-CM | POA: Diagnosis not present

## 2018-12-15 DIAGNOSIS — H34832 Tributary (branch) retinal vein occlusion, left eye, with macular edema: Secondary | ICD-10-CM | POA: Diagnosis not present

## 2018-12-15 DIAGNOSIS — E119 Type 2 diabetes mellitus without complications: Secondary | ICD-10-CM | POA: Diagnosis not present

## 2018-12-16 DIAGNOSIS — I480 Paroxysmal atrial fibrillation: Secondary | ICD-10-CM | POA: Diagnosis not present

## 2018-12-16 DIAGNOSIS — R1312 Dysphagia, oropharyngeal phase: Secondary | ICD-10-CM | POA: Diagnosis not present

## 2018-12-16 DIAGNOSIS — J438 Other emphysema: Secondary | ICD-10-CM | POA: Diagnosis not present

## 2018-12-16 DIAGNOSIS — E119 Type 2 diabetes mellitus without complications: Secondary | ICD-10-CM | POA: Diagnosis not present

## 2018-12-16 DIAGNOSIS — I700 Atherosclerosis of aorta: Secondary | ICD-10-CM | POA: Diagnosis not present

## 2018-12-16 DIAGNOSIS — D649 Anemia, unspecified: Secondary | ICD-10-CM | POA: Diagnosis not present

## 2018-12-16 DIAGNOSIS — I10 Essential (primary) hypertension: Secondary | ICD-10-CM | POA: Diagnosis not present

## 2018-12-16 DIAGNOSIS — K219 Gastro-esophageal reflux disease without esophagitis: Secondary | ICD-10-CM | POA: Diagnosis not present

## 2018-12-16 DIAGNOSIS — F329 Major depressive disorder, single episode, unspecified: Secondary | ICD-10-CM | POA: Diagnosis not present

## 2018-12-17 DIAGNOSIS — D649 Anemia, unspecified: Secondary | ICD-10-CM | POA: Diagnosis not present

## 2018-12-17 DIAGNOSIS — F329 Major depressive disorder, single episode, unspecified: Secondary | ICD-10-CM | POA: Diagnosis not present

## 2018-12-17 DIAGNOSIS — R1312 Dysphagia, oropharyngeal phase: Secondary | ICD-10-CM | POA: Diagnosis not present

## 2018-12-17 DIAGNOSIS — E119 Type 2 diabetes mellitus without complications: Secondary | ICD-10-CM | POA: Diagnosis not present

## 2018-12-17 DIAGNOSIS — K219 Gastro-esophageal reflux disease without esophagitis: Secondary | ICD-10-CM | POA: Diagnosis not present

## 2018-12-17 DIAGNOSIS — I10 Essential (primary) hypertension: Secondary | ICD-10-CM | POA: Diagnosis not present

## 2018-12-17 DIAGNOSIS — I480 Paroxysmal atrial fibrillation: Secondary | ICD-10-CM | POA: Diagnosis not present

## 2018-12-17 DIAGNOSIS — I700 Atherosclerosis of aorta: Secondary | ICD-10-CM | POA: Diagnosis not present

## 2018-12-17 DIAGNOSIS — J438 Other emphysema: Secondary | ICD-10-CM | POA: Diagnosis not present

## 2018-12-20 DIAGNOSIS — I10 Essential (primary) hypertension: Secondary | ICD-10-CM | POA: Diagnosis not present

## 2018-12-20 DIAGNOSIS — K219 Gastro-esophageal reflux disease without esophagitis: Secondary | ICD-10-CM | POA: Diagnosis not present

## 2018-12-20 DIAGNOSIS — R1312 Dysphagia, oropharyngeal phase: Secondary | ICD-10-CM | POA: Diagnosis not present

## 2018-12-20 DIAGNOSIS — E119 Type 2 diabetes mellitus without complications: Secondary | ICD-10-CM | POA: Diagnosis not present

## 2018-12-20 DIAGNOSIS — F329 Major depressive disorder, single episode, unspecified: Secondary | ICD-10-CM | POA: Diagnosis not present

## 2018-12-20 DIAGNOSIS — D649 Anemia, unspecified: Secondary | ICD-10-CM | POA: Diagnosis not present

## 2018-12-20 DIAGNOSIS — J438 Other emphysema: Secondary | ICD-10-CM | POA: Diagnosis not present

## 2018-12-20 DIAGNOSIS — I480 Paroxysmal atrial fibrillation: Secondary | ICD-10-CM | POA: Diagnosis not present

## 2018-12-20 DIAGNOSIS — I700 Atherosclerosis of aorta: Secondary | ICD-10-CM | POA: Diagnosis not present

## 2018-12-21 DIAGNOSIS — R1312 Dysphagia, oropharyngeal phase: Secondary | ICD-10-CM | POA: Diagnosis not present

## 2018-12-21 DIAGNOSIS — F329 Major depressive disorder, single episode, unspecified: Secondary | ICD-10-CM | POA: Diagnosis not present

## 2018-12-21 DIAGNOSIS — I700 Atherosclerosis of aorta: Secondary | ICD-10-CM | POA: Diagnosis not present

## 2018-12-21 DIAGNOSIS — Z7901 Long term (current) use of anticoagulants: Secondary | ICD-10-CM | POA: Diagnosis not present

## 2018-12-21 DIAGNOSIS — G4700 Insomnia, unspecified: Secondary | ICD-10-CM | POA: Diagnosis not present

## 2018-12-21 DIAGNOSIS — I4821 Permanent atrial fibrillation: Secondary | ICD-10-CM | POA: Diagnosis not present

## 2018-12-21 DIAGNOSIS — E119 Type 2 diabetes mellitus without complications: Secondary | ICD-10-CM | POA: Diagnosis not present

## 2018-12-21 DIAGNOSIS — N390 Urinary tract infection, site not specified: Secondary | ICD-10-CM | POA: Diagnosis not present

## 2018-12-21 DIAGNOSIS — L03011 Cellulitis of right finger: Secondary | ICD-10-CM | POA: Diagnosis not present

## 2018-12-21 DIAGNOSIS — I10 Essential (primary) hypertension: Secondary | ICD-10-CM | POA: Diagnosis not present

## 2018-12-21 DIAGNOSIS — I480 Paroxysmal atrial fibrillation: Secondary | ICD-10-CM | POA: Diagnosis not present

## 2018-12-21 DIAGNOSIS — D649 Anemia, unspecified: Secondary | ICD-10-CM | POA: Diagnosis not present

## 2018-12-21 DIAGNOSIS — E1151 Type 2 diabetes mellitus with diabetic peripheral angiopathy without gangrene: Secondary | ICD-10-CM | POA: Diagnosis not present

## 2018-12-21 DIAGNOSIS — J438 Other emphysema: Secondary | ICD-10-CM | POA: Diagnosis not present

## 2018-12-21 DIAGNOSIS — K219 Gastro-esophageal reflux disease without esophagitis: Secondary | ICD-10-CM | POA: Diagnosis not present

## 2018-12-22 DIAGNOSIS — D649 Anemia, unspecified: Secondary | ICD-10-CM | POA: Diagnosis not present

## 2018-12-22 DIAGNOSIS — F329 Major depressive disorder, single episode, unspecified: Secondary | ICD-10-CM | POA: Diagnosis not present

## 2018-12-22 DIAGNOSIS — I480 Paroxysmal atrial fibrillation: Secondary | ICD-10-CM | POA: Diagnosis not present

## 2018-12-22 DIAGNOSIS — M79644 Pain in right finger(s): Secondary | ICD-10-CM | POA: Diagnosis not present

## 2018-12-22 DIAGNOSIS — J438 Other emphysema: Secondary | ICD-10-CM | POA: Diagnosis not present

## 2018-12-22 DIAGNOSIS — E119 Type 2 diabetes mellitus without complications: Secondary | ICD-10-CM | POA: Diagnosis not present

## 2018-12-22 DIAGNOSIS — R82998 Other abnormal findings in urine: Secondary | ICD-10-CM | POA: Diagnosis not present

## 2018-12-22 DIAGNOSIS — I10 Essential (primary) hypertension: Secondary | ICD-10-CM | POA: Diagnosis not present

## 2018-12-22 DIAGNOSIS — R1312 Dysphagia, oropharyngeal phase: Secondary | ICD-10-CM | POA: Diagnosis not present

## 2018-12-22 DIAGNOSIS — K219 Gastro-esophageal reflux disease without esophagitis: Secondary | ICD-10-CM | POA: Diagnosis not present

## 2018-12-22 DIAGNOSIS — I700 Atherosclerosis of aorta: Secondary | ICD-10-CM | POA: Diagnosis not present

## 2018-12-22 DIAGNOSIS — L03011 Cellulitis of right finger: Secondary | ICD-10-CM | POA: Diagnosis not present

## 2018-12-23 DIAGNOSIS — J438 Other emphysema: Secondary | ICD-10-CM | POA: Diagnosis not present

## 2018-12-23 DIAGNOSIS — I700 Atherosclerosis of aorta: Secondary | ICD-10-CM | POA: Diagnosis not present

## 2018-12-23 DIAGNOSIS — I480 Paroxysmal atrial fibrillation: Secondary | ICD-10-CM | POA: Diagnosis not present

## 2018-12-23 DIAGNOSIS — F329 Major depressive disorder, single episode, unspecified: Secondary | ICD-10-CM | POA: Diagnosis not present

## 2018-12-23 DIAGNOSIS — I10 Essential (primary) hypertension: Secondary | ICD-10-CM | POA: Diagnosis not present

## 2018-12-23 DIAGNOSIS — E119 Type 2 diabetes mellitus without complications: Secondary | ICD-10-CM | POA: Diagnosis not present

## 2018-12-23 DIAGNOSIS — R1312 Dysphagia, oropharyngeal phase: Secondary | ICD-10-CM | POA: Diagnosis not present

## 2018-12-23 DIAGNOSIS — K219 Gastro-esophageal reflux disease without esophagitis: Secondary | ICD-10-CM | POA: Diagnosis not present

## 2018-12-23 DIAGNOSIS — D649 Anemia, unspecified: Secondary | ICD-10-CM | POA: Diagnosis not present

## 2018-12-26 DIAGNOSIS — I700 Atherosclerosis of aorta: Secondary | ICD-10-CM | POA: Diagnosis not present

## 2018-12-26 DIAGNOSIS — B351 Tinea unguium: Secondary | ICD-10-CM | POA: Diagnosis not present

## 2018-12-26 DIAGNOSIS — I10 Essential (primary) hypertension: Secondary | ICD-10-CM | POA: Diagnosis not present

## 2018-12-26 DIAGNOSIS — D649 Anemia, unspecified: Secondary | ICD-10-CM | POA: Diagnosis not present

## 2018-12-26 DIAGNOSIS — L03011 Cellulitis of right finger: Secondary | ICD-10-CM | POA: Diagnosis not present

## 2018-12-26 DIAGNOSIS — F329 Major depressive disorder, single episode, unspecified: Secondary | ICD-10-CM | POA: Diagnosis not present

## 2018-12-26 DIAGNOSIS — K219 Gastro-esophageal reflux disease without esophagitis: Secondary | ICD-10-CM | POA: Diagnosis not present

## 2018-12-26 DIAGNOSIS — L608 Other nail disorders: Secondary | ICD-10-CM | POA: Insufficient documentation

## 2018-12-26 DIAGNOSIS — R1312 Dysphagia, oropharyngeal phase: Secondary | ICD-10-CM | POA: Diagnosis not present

## 2018-12-26 DIAGNOSIS — I480 Paroxysmal atrial fibrillation: Secondary | ICD-10-CM | POA: Diagnosis not present

## 2018-12-26 DIAGNOSIS — E119 Type 2 diabetes mellitus without complications: Secondary | ICD-10-CM | POA: Diagnosis not present

## 2018-12-26 DIAGNOSIS — J438 Other emphysema: Secondary | ICD-10-CM | POA: Diagnosis not present

## 2019-01-11 DIAGNOSIS — E109 Type 1 diabetes mellitus without complications: Secondary | ICD-10-CM | POA: Diagnosis not present

## 2019-01-18 DIAGNOSIS — L03011 Cellulitis of right finger: Secondary | ICD-10-CM | POA: Diagnosis not present

## 2019-01-18 DIAGNOSIS — I4821 Permanent atrial fibrillation: Secondary | ICD-10-CM | POA: Diagnosis not present

## 2019-01-18 DIAGNOSIS — Z7901 Long term (current) use of anticoagulants: Secondary | ICD-10-CM | POA: Diagnosis not present

## 2019-01-18 DIAGNOSIS — Z6826 Body mass index (BMI) 26.0-26.9, adult: Secondary | ICD-10-CM | POA: Diagnosis not present

## 2019-02-14 DIAGNOSIS — Z7901 Long term (current) use of anticoagulants: Secondary | ICD-10-CM | POA: Diagnosis not present

## 2019-02-14 DIAGNOSIS — G2581 Restless legs syndrome: Secondary | ICD-10-CM | POA: Diagnosis not present

## 2019-02-14 DIAGNOSIS — I4821 Permanent atrial fibrillation: Secondary | ICD-10-CM | POA: Diagnosis not present

## 2019-02-21 DIAGNOSIS — R600 Localized edema: Secondary | ICD-10-CM | POA: Insufficient documentation

## 2019-02-21 DIAGNOSIS — I4821 Permanent atrial fibrillation: Secondary | ICD-10-CM | POA: Diagnosis not present

## 2019-02-21 DIAGNOSIS — E1151 Type 2 diabetes mellitus with diabetic peripheral angiopathy without gangrene: Secondary | ICD-10-CM | POA: Diagnosis not present

## 2019-02-21 DIAGNOSIS — G2581 Restless legs syndrome: Secondary | ICD-10-CM | POA: Diagnosis not present

## 2019-02-22 DIAGNOSIS — E109 Type 1 diabetes mellitus without complications: Secondary | ICD-10-CM | POA: Diagnosis not present

## 2019-03-01 DIAGNOSIS — E1151 Type 2 diabetes mellitus with diabetic peripheral angiopathy without gangrene: Secondary | ICD-10-CM | POA: Diagnosis not present

## 2019-03-01 DIAGNOSIS — N3281 Overactive bladder: Secondary | ICD-10-CM | POA: Diagnosis not present

## 2019-03-13 ENCOUNTER — Ambulatory Visit: Payer: Medicare HMO | Admitting: Podiatry

## 2019-03-13 ENCOUNTER — Encounter: Payer: Self-pay | Admitting: Podiatry

## 2019-03-13 ENCOUNTER — Other Ambulatory Visit: Payer: Self-pay

## 2019-03-13 VITALS — Temp 97.9°F

## 2019-03-13 DIAGNOSIS — B351 Tinea unguium: Secondary | ICD-10-CM | POA: Diagnosis not present

## 2019-03-13 DIAGNOSIS — M79676 Pain in unspecified toe(s): Secondary | ICD-10-CM | POA: Diagnosis not present

## 2019-03-13 NOTE — Progress Notes (Signed)
Patient ID: [name] [name], female   DOB: 1929-11-11, 85 y.o.   MRN: [id number]  Subjective: 85 y.o.-year-old female returns the office today for painful, elongated, thickened toenails which she is unable to trim herself. Denies any redness or drainage around the nails and she has no new concerns today. Denies any systemic complaints such as fevers, chills, nausea, vomiting.   Objective: NAD; presents with her husband who also has no new concerns. DP/PT pulses 1/4 bilaterally, CRT less than 3 seconds Protective sensation decreased with Simms Weinstein monofilament Nails hypertrophic, dystrophic, elongated, brittle, discolored 10. There is tenderness overlying the nails 1-5 bilaterally. There is no surrounding erythema or drainage along the nail sites. No open lesions or pre-ulcerative lesions are identified. No pain with calf compression, swelling, warmth, erythema.  Assessment: Patient presents with symptomatic onychomycosis  Plan: -Treatment options including alternatives, risks, complications were discussed -Nails sharply debrided 10 without complication/bleeding. -Discussed daily foot inspection. If there are any changes, to call the office immediately.  -Follow-up in 3 months or sooner if any problems are to arise. In the meantime, encouraged to call the office with any questions, concerns, changes symptoms.  Celesta Gentile, DPM

## 2019-03-16 DIAGNOSIS — E782 Mixed hyperlipidemia: Secondary | ICD-10-CM | POA: Diagnosis not present

## 2019-03-16 DIAGNOSIS — I10 Essential (primary) hypertension: Secondary | ICD-10-CM | POA: Diagnosis not present

## 2019-03-16 DIAGNOSIS — E1142 Type 2 diabetes mellitus with diabetic polyneuropathy: Secondary | ICD-10-CM | POA: Diagnosis not present

## 2019-03-16 DIAGNOSIS — Z794 Long term (current) use of insulin: Secondary | ICD-10-CM | POA: Diagnosis not present

## 2019-03-22 DIAGNOSIS — E109 Type 1 diabetes mellitus without complications: Secondary | ICD-10-CM | POA: Diagnosis not present

## 2019-03-22 DIAGNOSIS — Z7901 Long term (current) use of anticoagulants: Secondary | ICD-10-CM | POA: Diagnosis not present

## 2019-03-22 DIAGNOSIS — I4821 Permanent atrial fibrillation: Secondary | ICD-10-CM | POA: Diagnosis not present

## 2019-03-22 DIAGNOSIS — G4700 Insomnia, unspecified: Secondary | ICD-10-CM | POA: Diagnosis not present

## 2019-03-27 DIAGNOSIS — E109 Type 1 diabetes mellitus without complications: Secondary | ICD-10-CM | POA: Diagnosis not present

## 2019-03-28 DIAGNOSIS — E1151 Type 2 diabetes mellitus with diabetic peripheral angiopathy without gangrene: Secondary | ICD-10-CM | POA: Diagnosis not present

## 2019-03-28 DIAGNOSIS — N390 Urinary tract infection, site not specified: Secondary | ICD-10-CM | POA: Diagnosis not present

## 2019-03-28 DIAGNOSIS — G2581 Restless legs syndrome: Secondary | ICD-10-CM | POA: Diagnosis not present

## 2019-03-28 DIAGNOSIS — R2681 Unsteadiness on feet: Secondary | ICD-10-CM | POA: Diagnosis not present

## 2019-03-28 DIAGNOSIS — M545 Low back pain: Secondary | ICD-10-CM | POA: Diagnosis not present

## 2019-03-28 DIAGNOSIS — I4821 Permanent atrial fibrillation: Secondary | ICD-10-CM | POA: Diagnosis not present

## 2019-04-25 DIAGNOSIS — I4821 Permanent atrial fibrillation: Secondary | ICD-10-CM | POA: Diagnosis not present

## 2019-04-25 DIAGNOSIS — R600 Localized edema: Secondary | ICD-10-CM | POA: Diagnosis not present

## 2019-04-25 DIAGNOSIS — R110 Nausea: Secondary | ICD-10-CM | POA: Diagnosis not present

## 2019-04-25 DIAGNOSIS — Z7901 Long term (current) use of anticoagulants: Secondary | ICD-10-CM | POA: Diagnosis not present

## 2019-04-25 DIAGNOSIS — E1151 Type 2 diabetes mellitus with diabetic peripheral angiopathy without gangrene: Secondary | ICD-10-CM | POA: Diagnosis not present

## 2019-04-25 DIAGNOSIS — G2581 Restless legs syndrome: Secondary | ICD-10-CM | POA: Diagnosis not present

## 2019-05-18 DIAGNOSIS — E1142 Type 2 diabetes mellitus with diabetic polyneuropathy: Secondary | ICD-10-CM | POA: Diagnosis not present

## 2019-06-06 DIAGNOSIS — I4821 Permanent atrial fibrillation: Secondary | ICD-10-CM | POA: Diagnosis not present

## 2019-06-06 DIAGNOSIS — R600 Localized edema: Secondary | ICD-10-CM | POA: Diagnosis not present

## 2019-06-06 DIAGNOSIS — Z7901 Long term (current) use of anticoagulants: Secondary | ICD-10-CM | POA: Diagnosis not present

## 2019-06-14 ENCOUNTER — Ambulatory Visit: Payer: Medicare HMO | Admitting: Podiatry

## 2019-06-26 ENCOUNTER — Other Ambulatory Visit: Payer: Self-pay

## 2019-06-29 DIAGNOSIS — E109 Type 1 diabetes mellitus without complications: Secondary | ICD-10-CM | POA: Diagnosis not present

## 2019-06-30 DIAGNOSIS — E1142 Type 2 diabetes mellitus with diabetic polyneuropathy: Secondary | ICD-10-CM | POA: Diagnosis not present

## 2019-06-30 DIAGNOSIS — Z794 Long term (current) use of insulin: Secondary | ICD-10-CM | POA: Diagnosis not present

## 2019-07-13 DIAGNOSIS — Z7901 Long term (current) use of anticoagulants: Secondary | ICD-10-CM | POA: Diagnosis not present

## 2019-07-13 DIAGNOSIS — Z23 Encounter for immunization: Secondary | ICD-10-CM | POA: Diagnosis not present

## 2019-07-13 DIAGNOSIS — I4821 Permanent atrial fibrillation: Secondary | ICD-10-CM | POA: Diagnosis not present

## 2019-07-13 DIAGNOSIS — E1151 Type 2 diabetes mellitus with diabetic peripheral angiopathy without gangrene: Secondary | ICD-10-CM | POA: Diagnosis not present

## 2019-07-27 DIAGNOSIS — R2681 Unsteadiness on feet: Secondary | ICD-10-CM | POA: Diagnosis not present

## 2019-07-27 DIAGNOSIS — G2581 Restless legs syndrome: Secondary | ICD-10-CM | POA: Diagnosis not present

## 2019-07-27 DIAGNOSIS — G4700 Insomnia, unspecified: Secondary | ICD-10-CM | POA: Diagnosis not present

## 2019-07-27 DIAGNOSIS — N390 Urinary tract infection, site not specified: Secondary | ICD-10-CM | POA: Diagnosis not present

## 2019-07-27 DIAGNOSIS — I4821 Permanent atrial fibrillation: Secondary | ICD-10-CM | POA: Diagnosis not present

## 2019-07-27 DIAGNOSIS — Z794 Long term (current) use of insulin: Secondary | ICD-10-CM | POA: Diagnosis not present

## 2019-07-27 DIAGNOSIS — J449 Chronic obstructive pulmonary disease, unspecified: Secondary | ICD-10-CM | POA: Diagnosis not present

## 2019-07-27 DIAGNOSIS — E1151 Type 2 diabetes mellitus with diabetic peripheral angiopathy without gangrene: Secondary | ICD-10-CM | POA: Diagnosis not present

## 2019-08-08 ENCOUNTER — Encounter: Payer: Self-pay | Admitting: Podiatry

## 2019-08-08 ENCOUNTER — Ambulatory Visit: Payer: Medicare HMO | Admitting: Podiatry

## 2019-08-08 ENCOUNTER — Encounter

## 2019-08-08 ENCOUNTER — Other Ambulatory Visit: Payer: Self-pay

## 2019-08-08 DIAGNOSIS — B351 Tinea unguium: Secondary | ICD-10-CM | POA: Diagnosis not present

## 2019-08-08 DIAGNOSIS — E1140 Type 2 diabetes mellitus with diabetic neuropathy, unspecified: Secondary | ICD-10-CM

## 2019-08-08 DIAGNOSIS — G629 Polyneuropathy, unspecified: Secondary | ICD-10-CM | POA: Diagnosis not present

## 2019-08-08 NOTE — Progress Notes (Signed)
Complaint:  Visit Type: Patient returns to my office for continued preventative foot care services. Complaint: Patient states" my nails have grown long and thick and become painful to walk and wear shoes" Patient has been diagnosed with DM with no foot complications. The patient presents for preventative foot care services.  Podiatric Exam: Vascular: dorsalis pedis and posterior tibial pulses are diminished/absent palpable bilateral. Capillary return is immediate. Temperature gradient is WNL. Skin turgor WNL  Sensorium: Diminished  Semmes Weinstein monofilament test. Normal tactile sensation bilaterally. Nail Exam: Pt has thick disfigured discolored nails with subungual debris noted bilateral entire nail hallux through fifth toenails Ulcer Exam: There is no evidence of ulcer or pre-ulcerative changes or infection. Orthopedic Exam: Muscle tone and strength are WNL. No limitations in general ROM. No crepitus or effusions noted. Foot type and digits show no abnormalities. Bony prominences are unremarkable. Skin: No Porokeratosis. No infection or ulcers  Diagnosis:  Onychomycosis, , Pain in right toe, pain in left toes  Treatment & Plan Procedures and Treatment: Consent by patient was obtained for treatment procedures.   Debridement of mycotic and hypertrophic toenails, 1 through 5 bilateral and clearing of subungual debris. No ulceration, no infection noted.  Return Visit-Office Procedure: Patient instructed to return to the office for a follow up visit 3 months for continued evaluation and treatment.    Gardiner Barefoot DPM

## 2019-08-22 DIAGNOSIS — Z7901 Long term (current) use of anticoagulants: Secondary | ICD-10-CM | POA: Diagnosis not present

## 2019-08-22 DIAGNOSIS — E1151 Type 2 diabetes mellitus with diabetic peripheral angiopathy without gangrene: Secondary | ICD-10-CM | POA: Diagnosis not present

## 2019-08-22 DIAGNOSIS — I4821 Permanent atrial fibrillation: Secondary | ICD-10-CM | POA: Diagnosis not present

## 2019-09-01 IMAGING — DX DG CHEST 1V PORT
1 series · 1 of 1 positions shown · non-contrast
Comparison: January 06, 2018

CLINICAL DATA: Leukocytosis.

EXAM:
PORTABLE CHEST 1 VIEW

[chest ap]
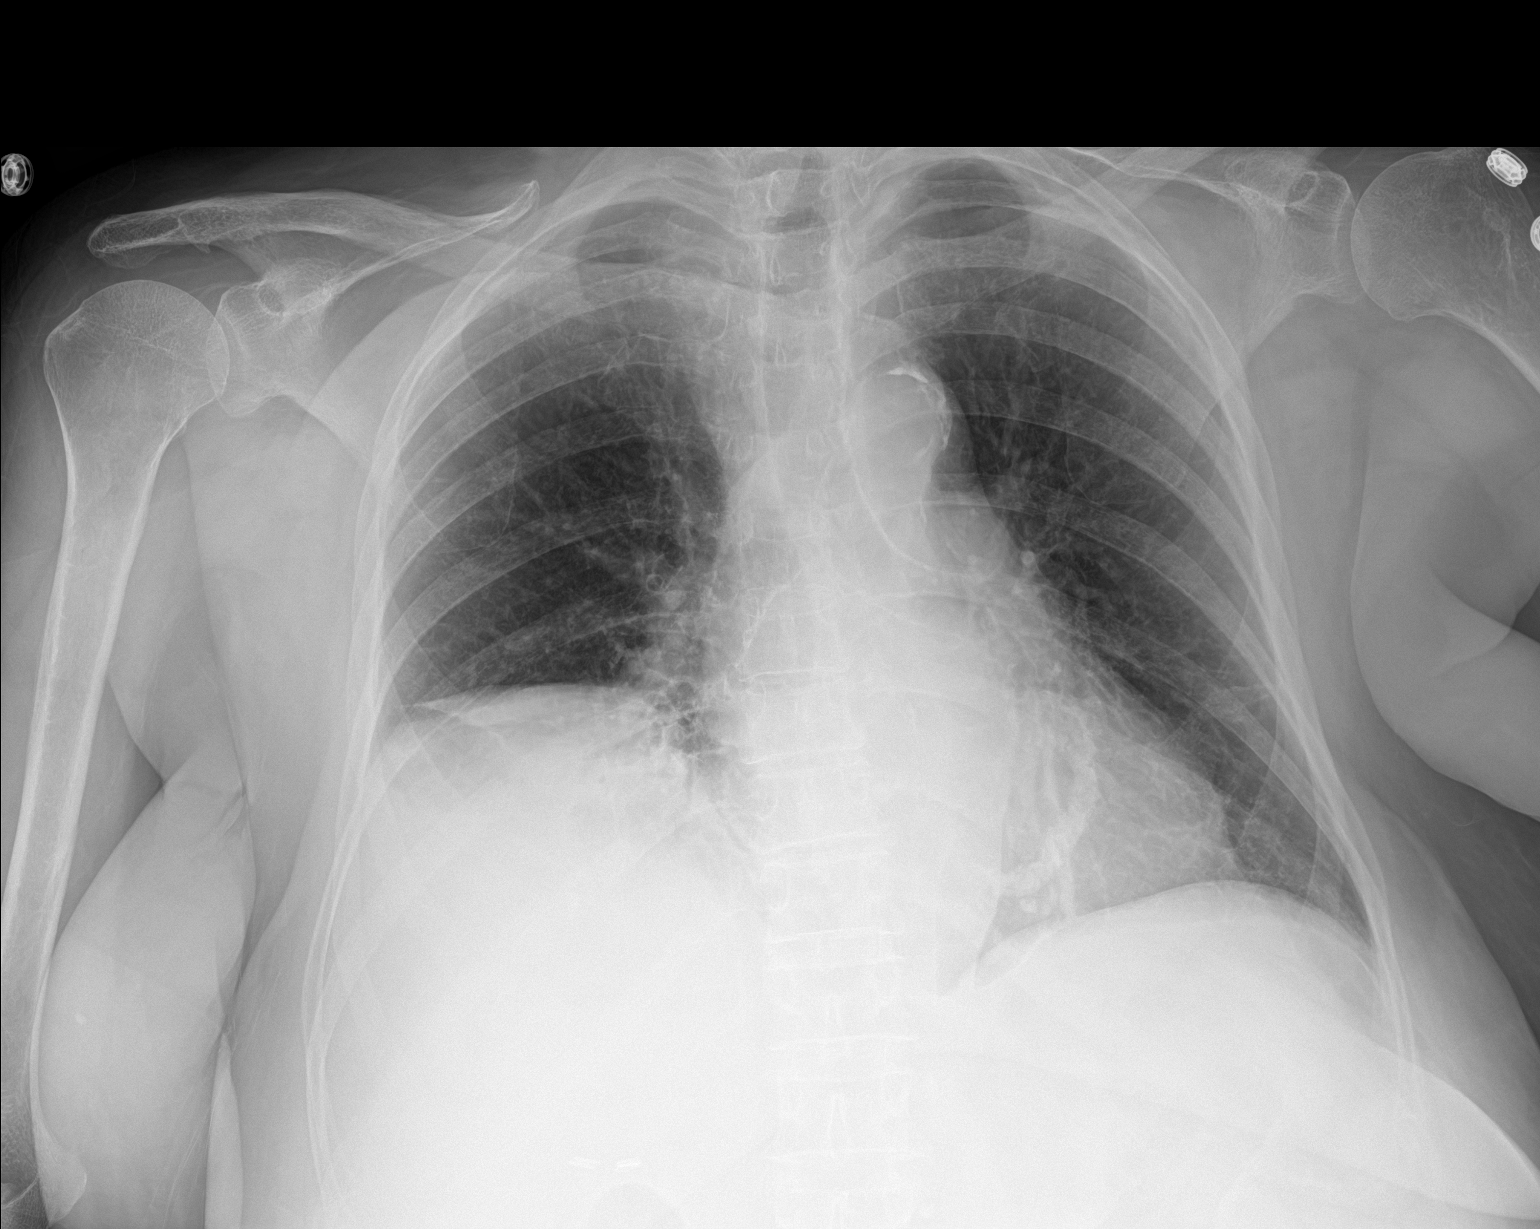

[1 of 1 positions shown; findings below may reference images not displayed]

FINDINGS: The elevated right hemidiaphragm persists. Superimposed opacity is
unchanged since January 06, 2018 but new compared to more remote
studies. The heart, hila, mediastinum, lungs, and pleura are
otherwise unchanged.
IMPRESSION: Opacity projected over the elevated right hemidiaphragm may
represent atelectasis or pneumonia. Recommend follow-up to
resolution.

## 2019-09-07 ENCOUNTER — Telehealth: Payer: Self-pay | Admitting: Cardiology

## 2019-09-07 NOTE — Telephone Encounter (Signed)
RETURNED [place] LIMITATIONS HERE IN OUR OFFICE HE WILL GIVE FD CELL PHONE NUMBER AND LIST OF QUESTIONS AND WE WILL HAVE dR h CALL WHEN HE IS IN THE ROOM.

## 2019-09-07 NOTE — Telephone Encounter (Signed)
New Message:      Husband called, he wants to know if he can come in with pt tomorrow for her appt? He says he takes care of all of her medicine and made sure she takes it right. He said if he can not come in, please just let him know, maybe something else can be worked out.

## 2019-09-11 DIAGNOSIS — I482 Chronic atrial fibrillation, unspecified: Secondary | ICD-10-CM | POA: Insufficient documentation

## 2019-09-11 DIAGNOSIS — R42 Dizziness and giddiness: Secondary | ICD-10-CM | POA: Insufficient documentation

## 2019-09-11 DIAGNOSIS — Z7901 Long term (current) use of anticoagulants: Secondary | ICD-10-CM | POA: Diagnosis not present

## 2019-09-11 DIAGNOSIS — Z7189 Other specified counseling: Secondary | ICD-10-CM | POA: Insufficient documentation

## 2019-09-11 DIAGNOSIS — I4821 Permanent atrial fibrillation: Secondary | ICD-10-CM | POA: Diagnosis not present

## 2019-09-11 HISTORY — DX: Other specified counseling: Z71.89

## 2019-09-11 NOTE — Progress Notes (Signed)
HPI The patient presents for evaluation of atrial fibrillation.  She presents for routine follow-up.  Since I last saw her she is done relatively well. The patient denies any new symptoms such as chest discomfort, neck or arm discomfort. There has been no new shortness of breath, PND or orthopnea. There have been no reported palpitations, presyncope or syncope.  She is not feeling any palpitations or fibrillation.  She is not having any of the unresponsive episodes that she had previously.  She is tolerating her anticoagulation.  She was in the hospital earlier this year with pneumonia but has not been hospitalized since February.   Allergies  Allergen Reactions  . Lactose Intolerance (Gi) Nausea And Vomiting and Other (See Comments)    severe stomach pain  . Penicillins Hives and Itching    "haven't used any since 1970's" - have gotten cephalosporins multiple times Has patient had a PCN reaction causing immediate rash, facial/tongue/throat swelling, SOB or lightheadedness with hypotension: Yes Has patient had a PCN reaction causing severe rash involving mucus membranes or skin necrosis: Unk Has patient had a PCN reaction that required hospitalization: Unk Has patient had a PCN reaction occurring within the last 10 years: No If all of the above answers are "NO", then may proceed with C  . Sulfa Antibiotics Hives and Itching    "haven't used any since 1970's"  . Ciprofloxacin Swelling    Site of swelling not recalled  . Sulfasalazine Hives and Itching    "haven't used any since 1970's"   Prior to Admission medications   Medication Sig Start Date End Date Taking? Authorizing Provider  acetaminophen (TYLENOL) 325 MG tablet Take 650 mg by mouth every 6 (six) hours as needed for mild pain.    Yes [provider]  diltiazem (DILT-XR) 120 MG 24 hr capsule TAKE 1 CAPSULE(120 MG) BY MOUTH DAILY Patient taking differently: Take 120 mg by mouth daily.  01/04/18  Yes Minus Breeding,  MD  guaiFENesin (MUCINEX) 600 MG 12 hr tablet Take 600 mg by mouth 2 (two) times daily.   Yes [provider]  guaiFENesin (ROBITUSSIN) 100 MG/5ML SOLN Take 5 mLs by mouth every 6 (six) hours as needed for cough or to loosen phlegm.   Yes [provider]  latanoprost (XALATAN) 0.005 % ophthalmic solution Place 1 drop into both eyes at bedtime.   Yes [provider]  loperamide (IMODIUM A-D) 2 MG tablet Take 1 mg by mouth at bedtime.   Yes [provider]  loperamide (LOPERAMIDE A-D) 2 MG tablet Take 1 tablet (2 mg total) by mouth as needed for diarrhea or loose stools (use at bedtime). Patient taking differently: Take 1-2 mg by mouth at bedtime as needed for diarrhea or loose stools.  11/27/14  Yes Gatha Mayer, MD  Melatonin 5 MG TABS Take 5 mg by mouth at bedtime.   Yes [provider]  metoprolol tartrate (LOPRESSOR) 25 MG tablet Take 12.5 mg by mouth 2 (two) times daily.   Yes [provider]  mirtazapine (REMERON) 7.5 MG tablet TAKE 1 TABLET(7.5 MG) BY MOUTH AT BEDTIME Patient taking differently: Take 7.5 mg by mouth at bedtime.  10/06/16  Yes Gatha Mayer, MD  Multiple Vitamin (MULTIVITAMIN WITH MINERALS) TABS Take 1 tablet by mouth daily.   Yes [provider]  oseltamivir (TAMIFLU) 30 MG capsule Take 1 capsule (30 mg total) by mouth 2 (two) times daily. 11/28/18  Yes Debbe Odea, MD  oxybutynin (  DITROPAN) 5 MG tablet Take 5 mg by mouth at bedtime.  07/29/16  Yes [provider]  pantoprazole (PROTONIX) 40 MG tablet Take 1 tablet (40 mg total) by mouth 2 (two) times daily. 06/07/18 11/27/27 Yes Arrien, Jimmy Picket, MD  Probiotic Product (PROBIOTIC PO) Take 1 capsule by mouth daily.   Yes [provider]  promethazine (PHENERGAN) 25 MG tablet Take 25 mg by mouth 3 (three) times daily as needed for nausea or vomiting.   Yes [provider]  rOPINIRole (REQUIP) 0.5 MG tablet Take 0.5 mg by mouth See  admin instructions. Take 1 tablet at 4 pm and at bedtime 10/19/18  Yes [provider]  SYMBICORT 160-4.5 MCG/ACT inhaler Inhale 2 puffs into the lungs 2 (two) times daily.  03/08/14  Yes [provider]  warfarin (COUMADIN) 2 MG tablet Take 2 mg by mouth daily.   Yes [provider]     Past Medical History:  Diagnosis Date  . Atrial fibrillation (HCC)    RVR hx.  Chronic Coumadin  . Bacteremia 10/2018  . C. difficile colitis 09/21/2013, 11/2013  . Carotid stenosis    a. Carotid US (10/2013):  bilat 1-39%; f/u 1 year  . Chronic lower back pain   . COPD (chronic obstructive pulmonary disease) ([place])   . Depression   . Diverticulosis of colon without hemorrhage   . GERD (gastroesophageal reflux disease)    with HH  . Glaucoma   . HCAP (healthcare-associated pneumonia) 11/21/2011  . High cholesterol   . Hypertension   . Metabolic encephalopathy 0000000  . Mitral regurgitation 03/11/2012  . Pancreatitis    ~2008, 11/2011 post ERCP  . Pneumonia 12/2011   "first time I know about"  . Sepsis ([place]) 11/27/2013  . SIRS (systemic inflammatory response syndrome) ([place]) 03/13/2012   a/w post ERCP  pancreatitis.   . Syncope   . Type II diabetes mellitus ([place])     Past Surgical History:  Procedure Laterality Date  . APPENDECTOMY  ~ 1960  . CATARACT EXTRACTION W/ INTRAOCULAR LENS  IMPLANT, BILATERAL  2000's  . CHOLECYSTECTOMY  1990's   Lap chole  . COLONOSCOPY  01/2003   diverticulosis, 72mm hyperplastic sigmoid polyp  . COLONOSCOPY WITH PROPOFOL N/A 06/02/2018   Procedure: COLONOSCOPY WITH PROPOFOL;  Surgeon: Lavena Bullion, DO;  Location: [facility];  Service: Gastroenterology;  Laterality: N/A;  . DILATION AND CURETTAGE OF UTERUS    . ERCP  11/13/2011   Procedure: ENDOSCOPIC RETROGRADE CHOLANGIOPANCREATOGRAPHY (ERCP);  Surgeon: Beryle Beams, MD;  Location: [facility] ENDOSCOPY;  Service: Endoscopy;  Laterality: N/A;  . FLEXIBLE SIGMOIDOSCOPY N/A 05/02/2014   Procedure:  FLEXIBLE SIGMOIDOSCOPY;  Surgeon: Gatha Mayer, MD;  Location: [facility] ENDOSCOPY;  Service: Endoscopy;  Laterality: N/A;  . HIP ARTHROPLASTY Left 01/07/2018   Procedure: ARTHROPLASTY BIPOLAR HIP (HEMIARTHROPLASTY);  Surgeon: Hiram Gash, MD;  Location: [place];  Service: Orthopedics;  Laterality: Left;  . TONSILLECTOMY AND ADENOIDECTOMY  ~ 1939  . VAGINAL HYSTERECTOMY  ~ 1960's    ROS:   As stated in the HPI and negative for all other systems.   PHYSICAL EXAM BP 125/61   Pulse (!) 58   Temp (!) 97.5 F (36.4 C)   Ht 5\' 1"  (1.549 m)   Wt 141 lb 9.6 oz (64.2 kg)   SpO2 98%   BMI 26.76 kg/m   GEN:  No distress NECK:  No jugular venous distention at 90 degrees, waveform within normal limits, carotid upstroke  brisk and symmetric, no bruits, no thyromegaly LYMPHATICS:  No cervical adenopathy LUNGS:  Clear to auscultation bilaterally BACK:  No CVA tenderness CHEST:  Unremarkable HEART:  S1 and S2 within normal limits, no S3, no S4, no clicks, no rubs, no murmurs ABD:  Positive bowel sounds normal in frequency in pitch, no bruits, no rebound, no guarding, unable to assess midline mass or bruit with the patient seated. EXT:  2 plus pulses throughout, mild edema, no cyanosis no clubbing SKIN:  No rashes no nodules NEURO:  Cranial nerves II through XII grossly intact, motor grossly intact throughout PSYCH:  Cognitively intact, oriented to person place and time   EKG:  NA   ASSESSMENT AND PLAN   Atrial fibrillation -  Ms. Christyna Murray-Rush has a CHA2DS2 - VASc score of 4 with a risk of stroke of 4%.  Tolerates anticoagulation.  She has had no further episodes of GI bleeding which she had in the past.  She has been eating popcorn and I said no.  She has a history of diverticulitis.  HTN (hypertension) -  Her blood pressure is well controlled.  No change in therapy.   Mitral regurgitation -  This was mild on echo in [id number] and certainly she would have this managed conservatively.  At this  point no further imaging.  I will consider repeat study in the future.    Carotid stenosis - This was mild in 2016.  No further testing at this point.   Covid Education -  We talked about getting glucose.

## 2019-09-12 ENCOUNTER — Encounter: Payer: Self-pay | Admitting: Cardiology

## 2019-09-12 ENCOUNTER — Ambulatory Visit (INDEPENDENT_AMBULATORY_CARE_PROVIDER_SITE_OTHER): Payer: Medicare HMO | Admitting: Cardiology

## 2019-09-12 ENCOUNTER — Other Ambulatory Visit: Payer: Self-pay

## 2019-09-12 VITALS — BP 125/61 | HR 58 | Temp 97.5°F | Ht 61.0 in | Wt 141.6 lb

## 2019-09-12 DIAGNOSIS — I482 Chronic atrial fibrillation, unspecified: Secondary | ICD-10-CM

## 2019-09-12 DIAGNOSIS — R42 Dizziness and giddiness: Secondary | ICD-10-CM

## 2019-09-12 DIAGNOSIS — Z7189 Other specified counseling: Secondary | ICD-10-CM

## 2019-09-12 DIAGNOSIS — I10 Essential (primary) hypertension: Secondary | ICD-10-CM

## 2019-09-12 NOTE — Patient Instructions (Signed)
Medication Instructions:  Your physician recommends that you continue on your current medications as directed. Please refer to the Current Medication list given to you today.  *If you need a refill on your cardiac medications before your next appointment, please call your pharmacy*  Lab Work: none If you have labs (blood work) drawn today and your tests are completely normal, you will receive your results only by: Marland Kitchen MyChart Message (if you have MyChart) OR . A paper copy in the mail If you have any lab test that is abnormal or we need to change your treatment, we will call you to review the results.  Testing/Procedures: none  Follow-Up: At [facility], you and your health needs are our priority.  As part of our continuing mission to provide you with exceptional heart care, we have created designated Provider Care Teams.  These Care Teams include your primary Cardiologist (physician) and Advanced Practice Providers (APPs -  Physician Assistants and Nurse Practitioners) who all work together to provide you with the care you need, when you need it.  Your next appointment:   12 month(s)  The format for your next appointment:   Either In Person or Virtual  Provider:   You may see Dr. Percival Spanish or one of the following Advanced Practice Providers on your designated Care Team:    Rosaria Ferries, PA-C  Jory Sims, DNP, ANP  Cadence Kathlen Mody, NP

## 2019-09-21 DIAGNOSIS — M25511 Pain in right shoulder: Secondary | ICD-10-CM | POA: Diagnosis not present

## 2019-09-21 DIAGNOSIS — M542 Cervicalgia: Secondary | ICD-10-CM | POA: Diagnosis not present

## 2019-10-12 DIAGNOSIS — E1142 Type 2 diabetes mellitus with diabetic polyneuropathy: Secondary | ICD-10-CM | POA: Diagnosis not present

## 2019-10-12 DIAGNOSIS — I10 Essential (primary) hypertension: Secondary | ICD-10-CM | POA: Diagnosis not present

## 2019-10-12 DIAGNOSIS — E782 Mixed hyperlipidemia: Secondary | ICD-10-CM | POA: Diagnosis not present

## 2019-10-12 DIAGNOSIS — Z794 Long term (current) use of insulin: Secondary | ICD-10-CM | POA: Diagnosis not present

## 2019-10-17 DIAGNOSIS — E7849 Other hyperlipidemia: Secondary | ICD-10-CM | POA: Diagnosis not present

## 2019-10-17 DIAGNOSIS — E1151 Type 2 diabetes mellitus with diabetic peripheral angiopathy without gangrene: Secondary | ICD-10-CM | POA: Diagnosis not present

## 2019-10-18 DIAGNOSIS — G2581 Restless legs syndrome: Secondary | ICD-10-CM | POA: Diagnosis not present

## 2019-10-18 DIAGNOSIS — R2681 Unsteadiness on feet: Secondary | ICD-10-CM | POA: Diagnosis not present

## 2019-10-18 DIAGNOSIS — R600 Localized edema: Secondary | ICD-10-CM | POA: Diagnosis not present

## 2019-10-18 DIAGNOSIS — R82998 Other abnormal findings in urine: Secondary | ICD-10-CM | POA: Diagnosis not present

## 2019-10-18 DIAGNOSIS — I4821 Permanent atrial fibrillation: Secondary | ICD-10-CM | POA: Diagnosis not present

## 2019-10-18 DIAGNOSIS — M25511 Pain in right shoulder: Secondary | ICD-10-CM | POA: Diagnosis not present

## 2019-10-18 DIAGNOSIS — Z7901 Long term (current) use of anticoagulants: Secondary | ICD-10-CM | POA: Diagnosis not present

## 2019-10-24 DIAGNOSIS — R2681 Unsteadiness on feet: Secondary | ICD-10-CM | POA: Diagnosis not present

## 2019-10-24 DIAGNOSIS — I4821 Permanent atrial fibrillation: Secondary | ICD-10-CM | POA: Diagnosis not present

## 2019-10-24 DIAGNOSIS — Z0000 Encounter for general adult medical examination without abnormal findings: Secondary | ICD-10-CM | POA: Diagnosis not present

## 2019-10-24 DIAGNOSIS — N390 Urinary tract infection, site not specified: Secondary | ICD-10-CM | POA: Diagnosis not present

## 2019-10-24 DIAGNOSIS — E1151 Type 2 diabetes mellitus with diabetic peripheral angiopathy without gangrene: Secondary | ICD-10-CM | POA: Diagnosis not present

## 2019-10-24 DIAGNOSIS — Z7901 Long term (current) use of anticoagulants: Secondary | ICD-10-CM | POA: Diagnosis not present

## 2019-10-24 DIAGNOSIS — Z794 Long term (current) use of insulin: Secondary | ICD-10-CM | POA: Diagnosis not present

## 2019-10-24 DIAGNOSIS — M25511 Pain in right shoulder: Secondary | ICD-10-CM | POA: Diagnosis not present

## 2019-10-24 DIAGNOSIS — J449 Chronic obstructive pulmonary disease, unspecified: Secondary | ICD-10-CM | POA: Diagnosis not present

## 2019-10-24 DIAGNOSIS — I739 Peripheral vascular disease, unspecified: Secondary | ICD-10-CM | POA: Diagnosis not present

## 2019-10-25 ENCOUNTER — Other Ambulatory Visit: Payer: Self-pay

## 2019-10-25 NOTE — Patient Outreach (Signed)
[facility]) Care Management  10/25/2019  [name] [name] 31-May-1930 [id number]   Telephone Screen  Referral Date: 10/24/2019 Referral Source: Remote Health Referral Reason: "needs dental work-please assist with dentist in network" Insurance: [employer]   Outreach attempt #1 to patient. Spoke with spouse who requested call be completed with him and discussed with referral. He reports that he tried over a year ago to get him and patient an in Database administrator. He had a list and called a few of the numbers but did not connect with a provider. Advised spouse that given it is a new plan year that list of providers may have changed. Instructed him to call [facility] customer service to get updated list of providers. Spouse voices that he is able and capable of doing so. RN CM discussed only option available through [facility] was list of dental clinics from [place]. Spouse aware that these would be out of network providers. He did not wish to pursue this at this time. He reported he was fine calling [employer] back to get a list. He voiced no further RN CM needs or concerns at this time and was appreciative of call.     Plan: RN CM will close case at this time.   Enzo Montgomery, RN,BSN,CCM [facility] [facility] Telephonic Care Management Coordinator Direct Phone: [phone] Toll Free: [phone] Fax: [phone]

## 2019-11-02 DIAGNOSIS — M25511 Pain in right shoulder: Secondary | ICD-10-CM | POA: Diagnosis not present

## 2019-11-07 ENCOUNTER — Other Ambulatory Visit: Payer: Self-pay

## 2019-11-07 ENCOUNTER — Encounter: Payer: Self-pay | Admitting: Podiatry

## 2019-11-07 ENCOUNTER — Ambulatory Visit: Payer: Medicare HMO | Admitting: Podiatry

## 2019-11-07 DIAGNOSIS — B351 Tinea unguium: Secondary | ICD-10-CM

## 2019-11-07 DIAGNOSIS — E1140 Type 2 diabetes mellitus with diabetic neuropathy, unspecified: Secondary | ICD-10-CM | POA: Diagnosis not present

## 2019-11-07 NOTE — Progress Notes (Signed)
Complaint:  Visit Type: Patient returns to my office for continued preventative foot care services. Complaint: Patient states" my nails have grown long and thick and become painful to walk and wear shoes" Patient has been diagnosed with DM with no foot complications. The patient presents for preventative foot care services. Patient presents with her female caregiver.  Podiatric Exam: Vascular: dorsalis pedis and posterior tibial pulses are diminished/absent palpable bilateral. Capillary return is immediate. Temperature gradient is WNL. Skin turgor WNL  Sensorium: Diminished  Semmes Weinstein monofilament test. Normal tactile sensation bilaterally. Nail Exam: Pt has thick disfigured discolored nails with subungual debris noted bilateral entire nail hallux through fifth toenails Ulcer Exam: There is no evidence of ulcer or pre-ulcerative changes or infection. Orthopedic Exam: Muscle tone and strength are WNL. No limitations in general ROM. No crepitus or effusions noted. Foot type and digits show no abnormalities. Bony prominences are unremarkable. Skin: No Porokeratosis. No infection or ulcers  Diagnosis:  Onychomycosis, , Pain in right toe, pain in left toes  Treatment & Plan Procedures and Treatment: Consent by patient was obtained for treatment procedures.   Debridement of mycotic and hypertrophic toenails, 1 through 5 bilateral and clearing of subungual debris. No ulceration, no infection noted.  Return Visit-Office Procedure: Patient instructed to return to the office for a follow up visit 3 months for continued evaluation and treatment.    Gardiner Barefoot DPM

## 2019-11-09 DIAGNOSIS — E119 Type 2 diabetes mellitus without complications: Secondary | ICD-10-CM | POA: Diagnosis not present

## 2019-11-09 DIAGNOSIS — H401132 Primary open-angle glaucoma, bilateral, moderate stage: Secondary | ICD-10-CM | POA: Insufficient documentation

## 2019-11-09 DIAGNOSIS — Z961 Presence of intraocular lens: Secondary | ICD-10-CM | POA: Diagnosis not present

## 2019-11-09 DIAGNOSIS — H353132 Nonexudative age-related macular degeneration, bilateral, intermediate dry stage: Secondary | ICD-10-CM | POA: Diagnosis not present

## 2019-11-09 DIAGNOSIS — H34832 Tributary (branch) retinal vein occlusion, left eye, with macular edema: Secondary | ICD-10-CM | POA: Diagnosis not present

## 2019-11-22 DIAGNOSIS — E109 Type 1 diabetes mellitus without complications: Secondary | ICD-10-CM | POA: Diagnosis not present

## 2019-12-25 DIAGNOSIS — E1151 Type 2 diabetes mellitus with diabetic peripheral angiopathy without gangrene: Secondary | ICD-10-CM | POA: Diagnosis not present

## 2019-12-25 DIAGNOSIS — Z7901 Long term (current) use of anticoagulants: Secondary | ICD-10-CM | POA: Diagnosis not present

## 2019-12-25 DIAGNOSIS — I4821 Permanent atrial fibrillation: Secondary | ICD-10-CM | POA: Diagnosis not present

## 2019-12-25 DIAGNOSIS — H401134 Primary open-angle glaucoma, bilateral, indeterminate stage: Secondary | ICD-10-CM | POA: Diagnosis not present

## 2019-12-26 DIAGNOSIS — M25511 Pain in right shoulder: Secondary | ICD-10-CM | POA: Diagnosis not present

## 2020-01-03 DIAGNOSIS — M25511 Pain in right shoulder: Secondary | ICD-10-CM | POA: Diagnosis not present

## 2020-01-03 DIAGNOSIS — E109 Type 1 diabetes mellitus without complications: Secondary | ICD-10-CM | POA: Diagnosis not present

## 2020-01-04 DIAGNOSIS — H34832 Tributary (branch) retinal vein occlusion, left eye, with macular edema: Secondary | ICD-10-CM | POA: Diagnosis not present

## 2020-01-04 DIAGNOSIS — E119 Type 2 diabetes mellitus without complications: Secondary | ICD-10-CM | POA: Diagnosis not present

## 2020-01-04 DIAGNOSIS — H353132 Nonexudative age-related macular degeneration, bilateral, intermediate dry stage: Secondary | ICD-10-CM | POA: Diagnosis not present

## 2020-01-04 DIAGNOSIS — H401132 Primary open-angle glaucoma, bilateral, moderate stage: Secondary | ICD-10-CM | POA: Diagnosis not present

## 2020-01-04 DIAGNOSIS — Z961 Presence of intraocular lens: Secondary | ICD-10-CM | POA: Diagnosis not present

## 2020-01-06 IMAGING — MR MR HEAD W/O CM
10 of 11 series · 42 of 48 positions shown · non-contrast
Comparison: 05/16/2018 CT head.  03/05/2004 MRI head.

CLINICAL DATA: 88 y/o  F; vision problems.

EXAM:
MRI HEAD WITHOUT CONTRAST
TECHNIQUE: Multiplanar, multiecho pulse sequences of the brain and surrounding
structures were obtained without intravenous contrast.

[Series 5: DWI · axial · 4.0mm · 0.88mm/px · z∈[-65,+71]mm · 8 of 70 slices shown (1 of 4)]
[im 1/70]
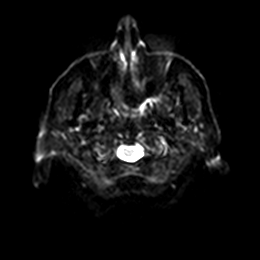
[im 10/70]
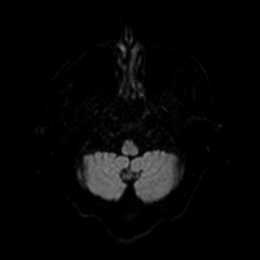
[im 20/70]
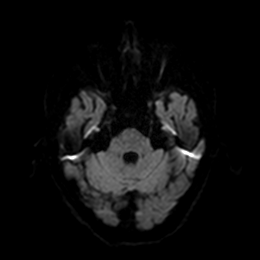
[im 30/70]
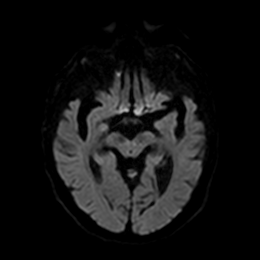
[im 40/70]
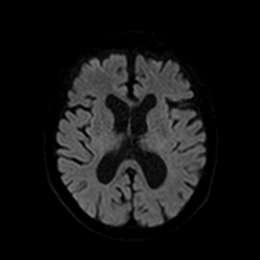
[im 50/70]
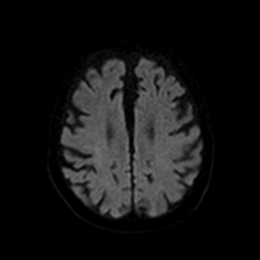
[im 60/70]
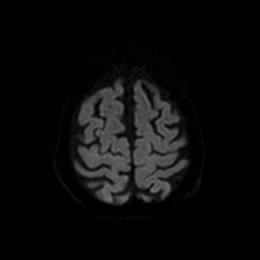
[im 70/70]
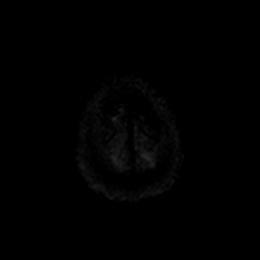

[Series 6: DWI · axial · 4.0mm · 0.88mm/px · z∈[-65,+71]mm · 4 of 35 slices shown (2 of 4)]
[im 1/35]
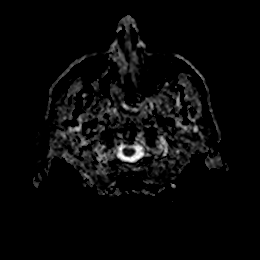
[im 12/35]
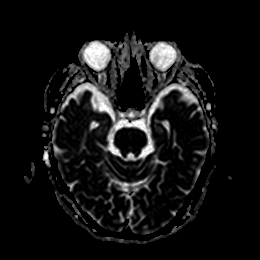
[im 23/35]
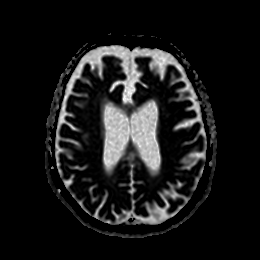
[im 35/35]
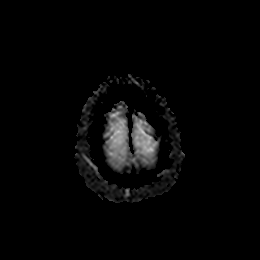

[Series 7: DWI · coronal · 4.0mm · 0.88mm/px · 7 of 68 slices shown (3 of 4)]
[im 1/68]
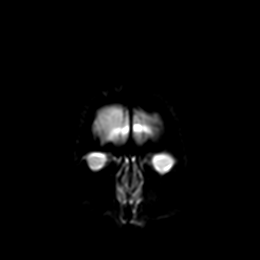
[im 12/68]
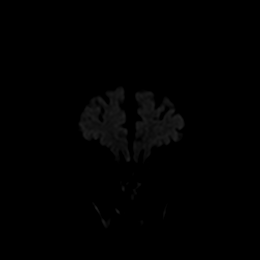
[im 23/68]
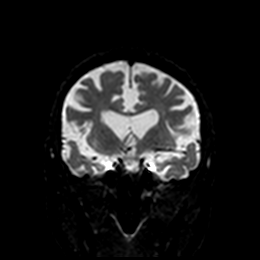
[im 34/68]
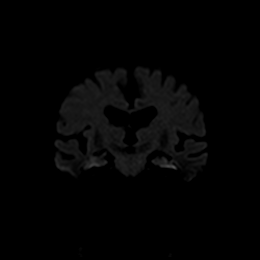
[im 45/68]
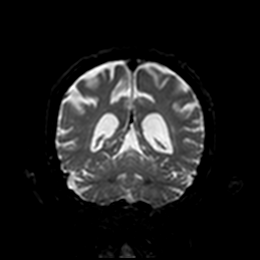
[im 56/68]
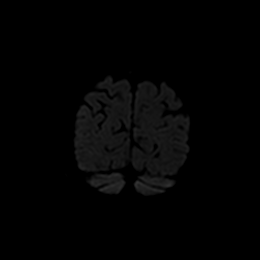
[im 68/68]
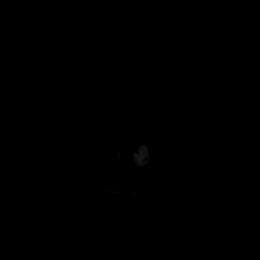

[Series 8: DWI · coronal · 4.0mm · 0.88mm/px · 3 of 34 slices shown (4 of 4)]
[im 1/34]
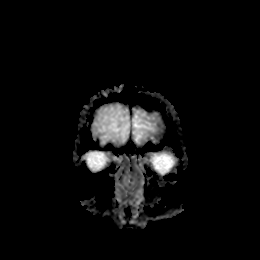
[im 17/34]
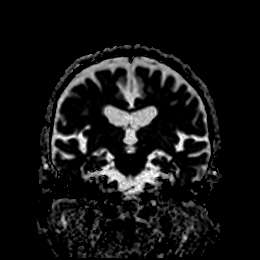
[im 34/34]
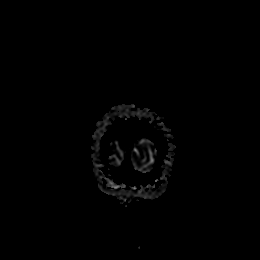

[Series 9: T1 · sagittal · 5.0mm · 0.75mm/px · 2 of 23 slices shown]
[im 1/23]
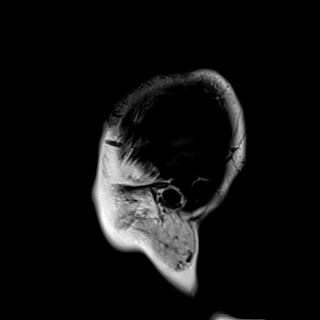
[im 23/23]
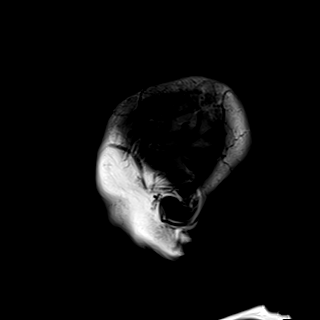

[Series 10: T2 · axial · 5.0mm · 0.72mm/px · z∈[-73,+71]mm · 2 of 25 slices shown (1 of 2)]
[im 1/25]
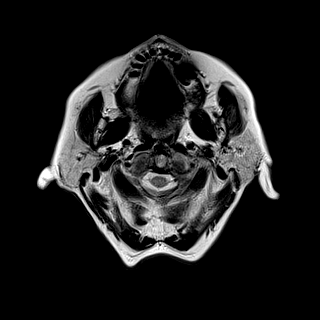
[im 25/25]
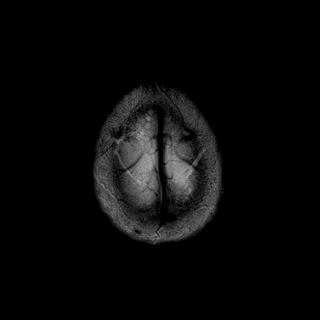

[Series 11: FLAIR · axial · 5.0mm · 0.45mm/px · z∈[-72,+71]mm · 2 of 25 slices shown]
[im 1/25]
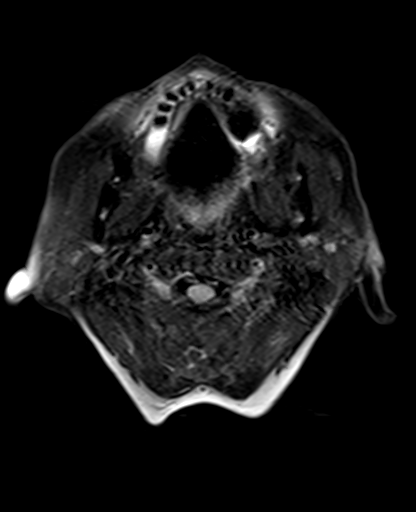
[im 25/25]
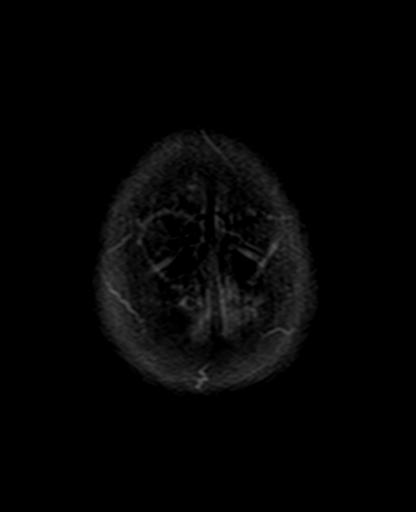

[Series 12: swi_images · axial · 3.0mm · 0.90mm/px · z∈[-79,+98]mm · 6 of 60 slices shown]
[im 1/60]
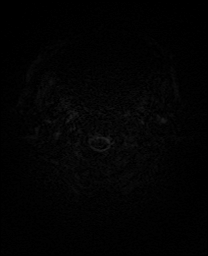
[im 12/60]
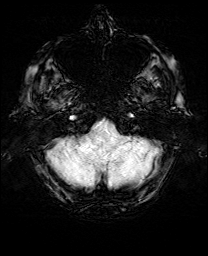
[im 24/60]
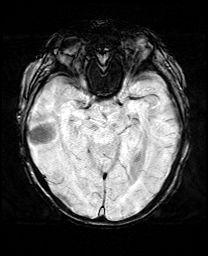
[im 36/60]
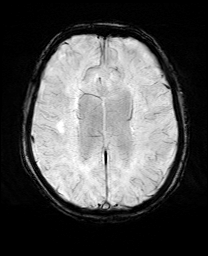
[im 48/60]
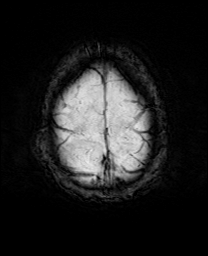
[im 60/60]
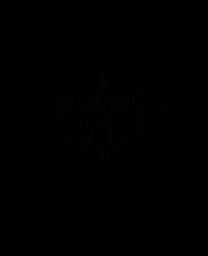

[Series 13: mip_images(sw) · axial · 24.0mm · 0.90mm/px · z∈[-68,+87]mm · 5 of 53 slices shown]
[im 1/53]
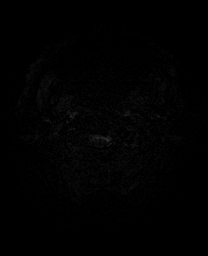
[im 14/53]
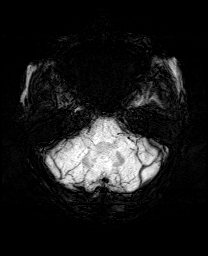
[im 27/53]
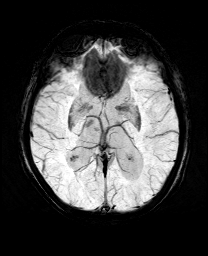
[im 40/53]
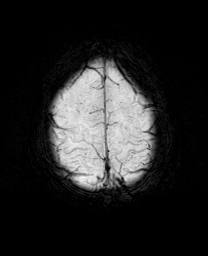
[im 53/53]
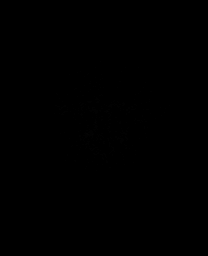

[Series 15: T2 · coronal · 5.0mm · 0.72mm/px · 3 of 28 slices shown (2 of 2)]
[im 1/28]
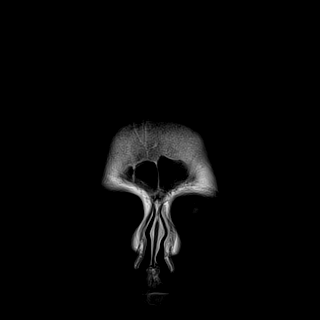
[im 14/28]
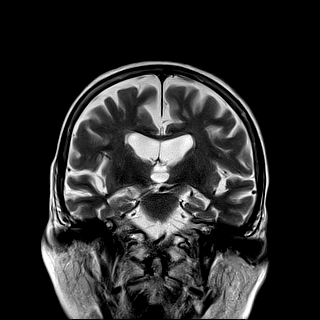
[im 28/28]
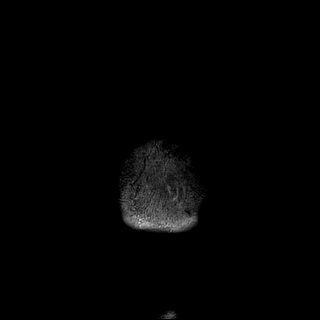

[42 of 48 positions shown; findings below may reference images not displayed]

FINDINGS: Brain: No acute infarction, hemorrhage, hydrocephalus, extra-axial
collection or mass lesion. Several nonspecific T2 FLAIR
hyperintensities in subcortical and periventricular white matter are
compatible with mild chronic microvascular ischemic changes for age.
Moderate volume loss of the brain.

Vascular: Normal flow voids.

Skull and upper cervical spine: Normal marrow signal.

Sinuses/Orbits: Negative.

Other: Bilateral intra-ocular lens replacement.
IMPRESSION: 1. No acute intracranial abnormality.
2. Mild for age chronic microvascular ischemic changes and moderate
volume loss of the brain. Mild progression from 5881.

By: Enzo Mcginty M.D.

## 2020-01-06 IMAGING — CT CT HEAD W/O CM
4 series · 17 of 47 positions shown, 19 images · non-contrast
Comparison: 01/06/2018

CLINICAL DATA: Vision problems while ambulating

EXAM:
CT HEAD WITHOUT CONTRAST
TECHNIQUE: Contiguous axial images were obtained from the base of the skull
through the vertex without intravenous contrast.

[Series 3: head wo · axial · 0.35mm/px · z∈[-117,+5]mm · 7 of 35 slices shown, 9 images]
[im 5/35  brain]
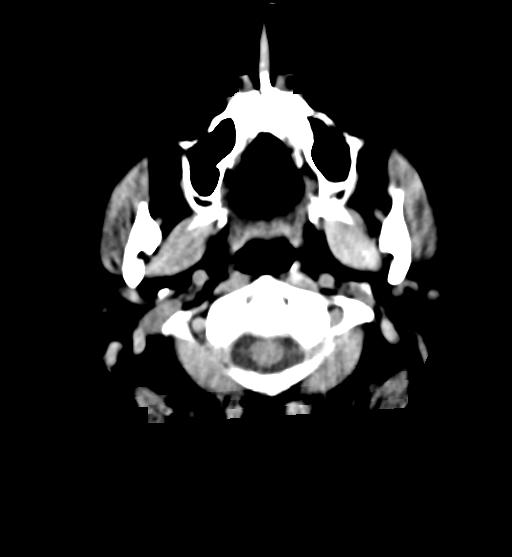
[im 5/35  bone]
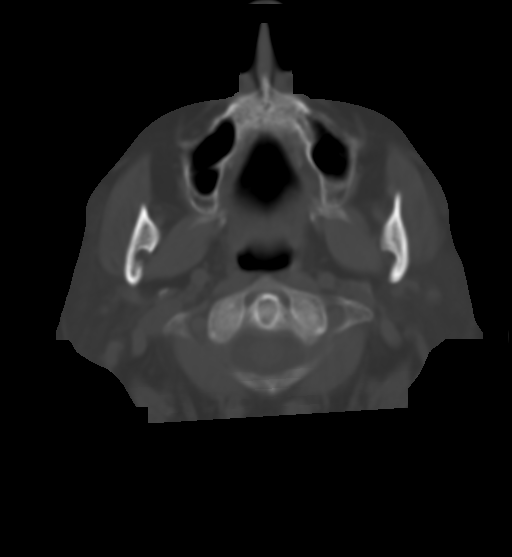
[im 9/35  brain]
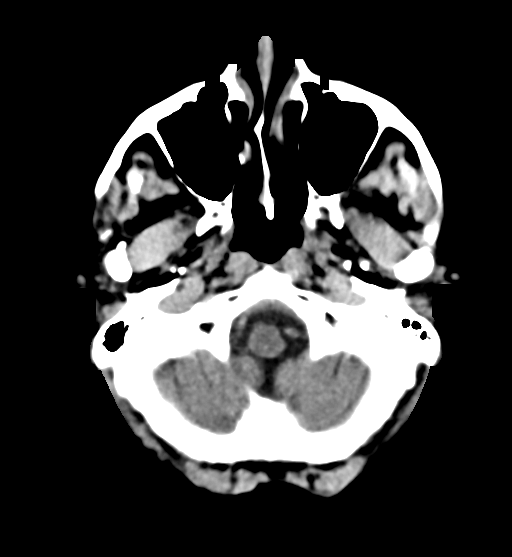
[im 13/35  brain]
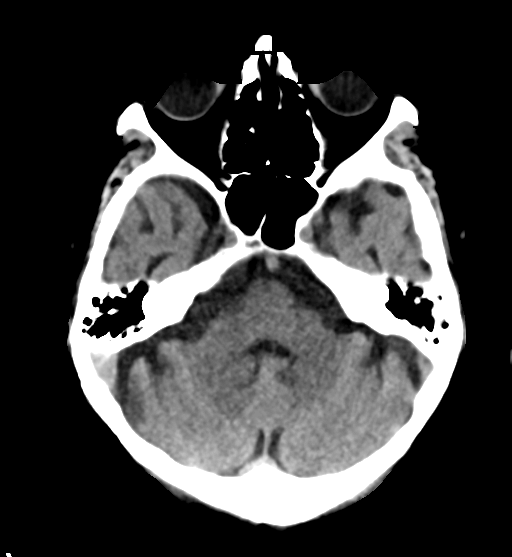
[im 18/35  brain]
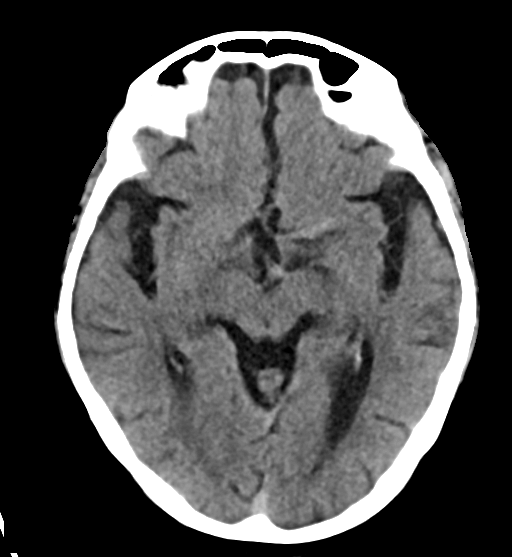
[im 22/35  brain]
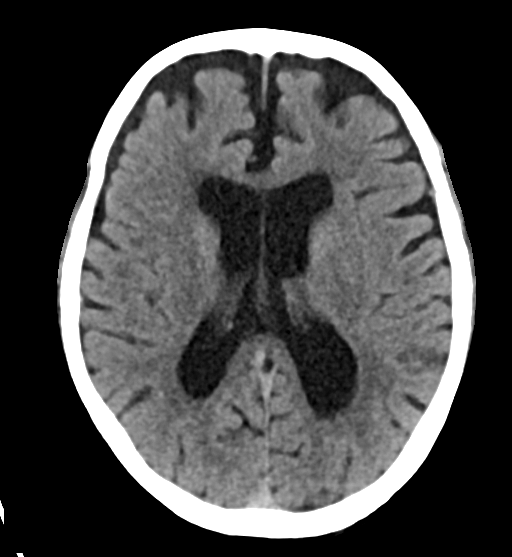
[im 22/35  bone]
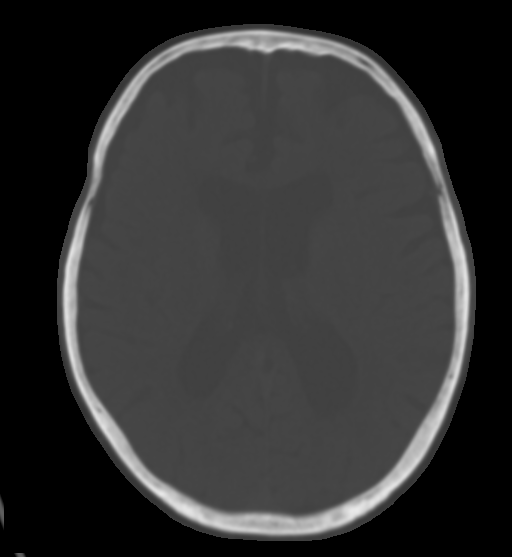
[im 26/35  brain]
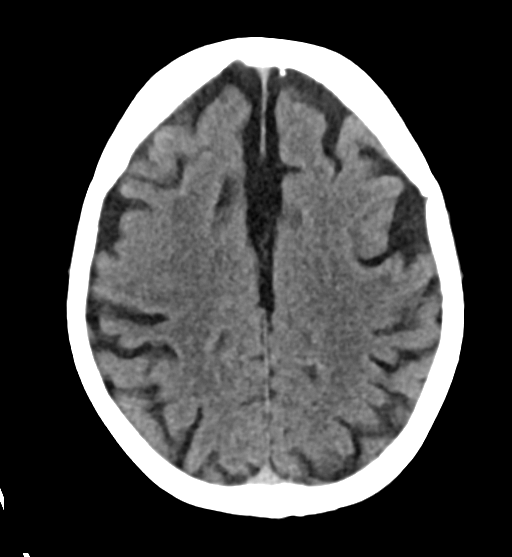
[im 30/35  brain]
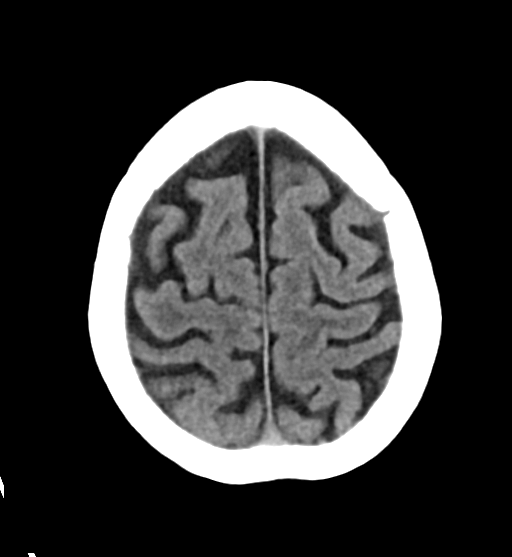

[Series 4: head bone · axial · 0.45mm/px · z∈[-136,-78]mm · 4 of 85 slices shown]
[im 9/85  bone]
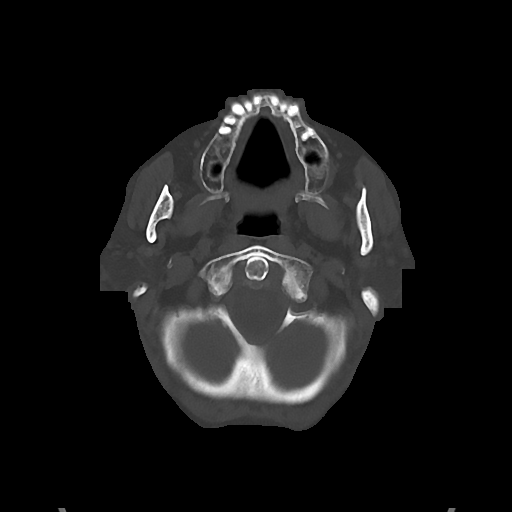
[im 17/85  bone]
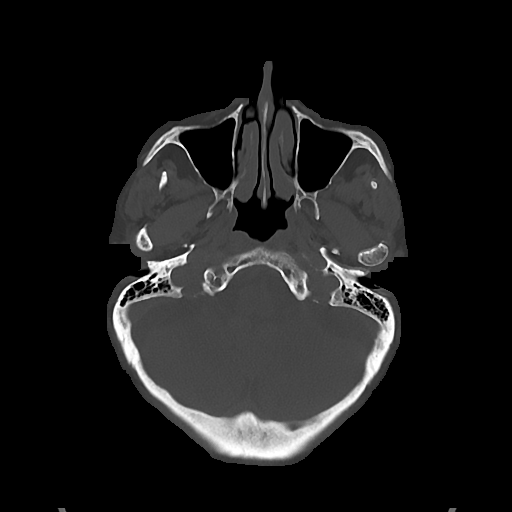
[im 26/85  bone]
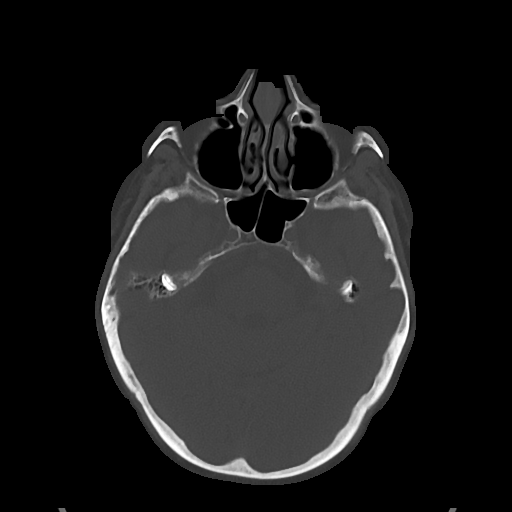
[im 38/85  bone]
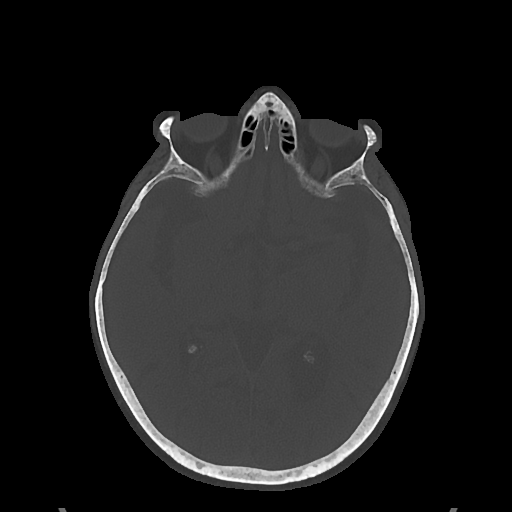

[Series 5: cor soft · coronal · 0.34mm/px · 3 of 66 slices shown]
[im 22/66  brain]
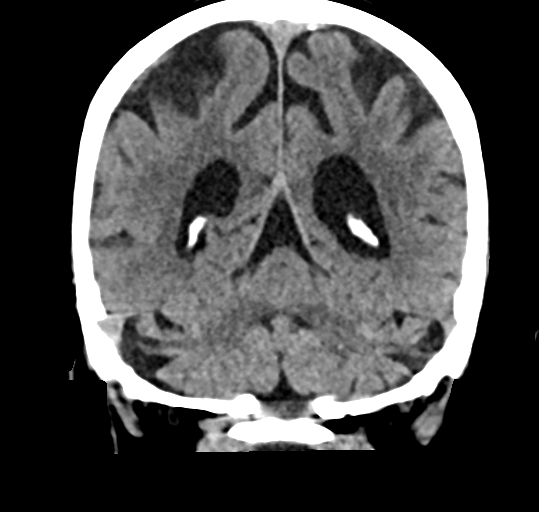
[im 29/66  brain]
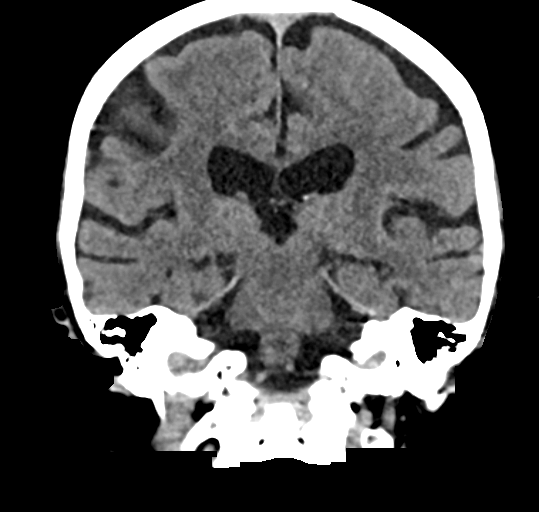
[im 37/66  brain]
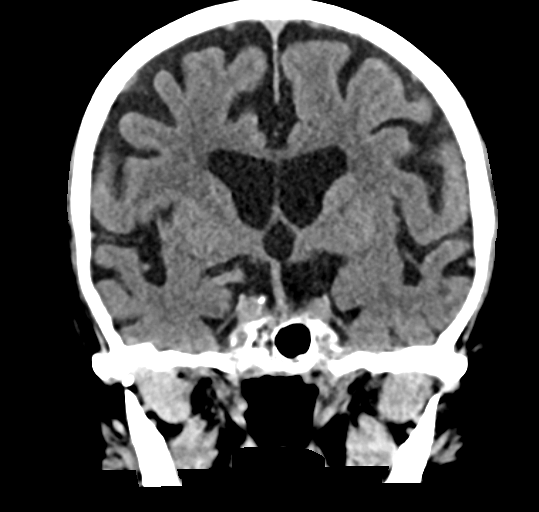

[Series 6: sag soft · sagittal · 0.34mm/px · 3 of 56 slices shown]
[im 19/56  brain]
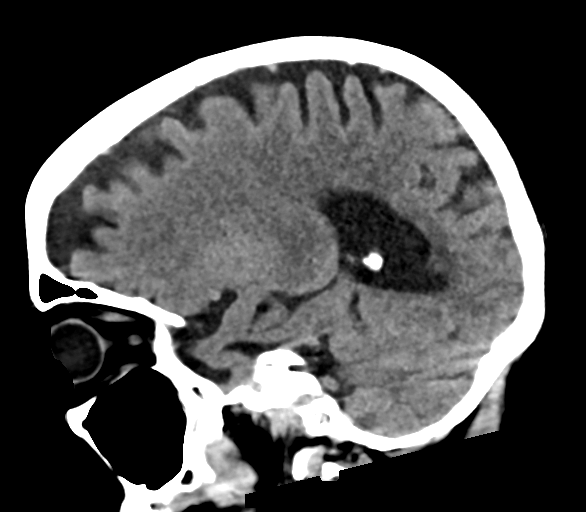
[im 28/56  brain]
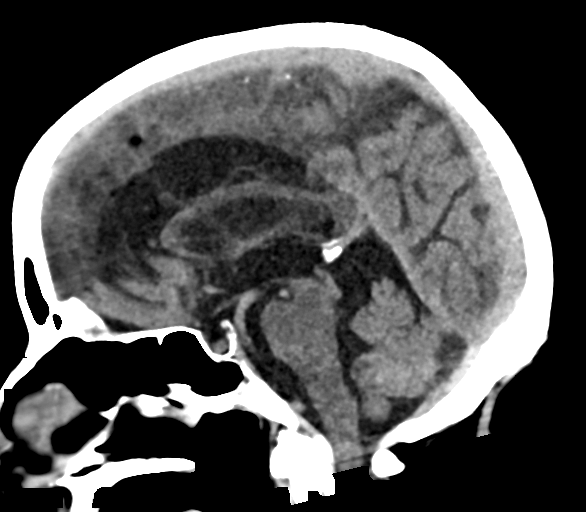
[im 37/56  brain]
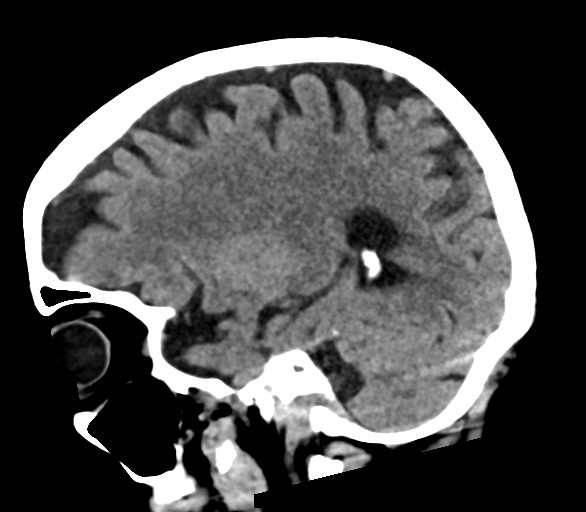

[17 of 47 positions shown; findings below may reference images not displayed]

FINDINGS: Brain: Diffuse atrophic changes are identified. Mild chronic white
matter ischemic change is seen. No findings to suggest acute
hemorrhage, acute infarction or space-occupying mass lesion are
noted.

Vascular: No hyperdense vessel or unexpected calcification.

Skull: Normal. Negative for fracture or focal lesion.

Sinuses/Orbits: No acute finding.

Other: None.
IMPRESSION: Chronic atrophic and ischemic changes without acute abnormality.

## 2020-01-09 DIAGNOSIS — M25511 Pain in right shoulder: Secondary | ICD-10-CM | POA: Diagnosis not present

## 2020-01-10 DIAGNOSIS — E782 Mixed hyperlipidemia: Secondary | ICD-10-CM | POA: Diagnosis not present

## 2020-01-10 DIAGNOSIS — I10 Essential (primary) hypertension: Secondary | ICD-10-CM | POA: Diagnosis not present

## 2020-01-10 DIAGNOSIS — Z794 Long term (current) use of insulin: Secondary | ICD-10-CM | POA: Diagnosis not present

## 2020-01-10 DIAGNOSIS — E1142 Type 2 diabetes mellitus with diabetic polyneuropathy: Secondary | ICD-10-CM | POA: Diagnosis not present

## 2020-01-29 DIAGNOSIS — H401134 Primary open-angle glaucoma, bilateral, indeterminate stage: Secondary | ICD-10-CM | POA: Diagnosis not present

## 2020-01-30 DIAGNOSIS — M75101 Unspecified rotator cuff tear or rupture of right shoulder, not specified as traumatic: Secondary | ICD-10-CM

## 2020-01-30 DIAGNOSIS — K009 Disorder of tooth development, unspecified: Secondary | ICD-10-CM | POA: Diagnosis not present

## 2020-01-30 DIAGNOSIS — Z7901 Long term (current) use of anticoagulants: Secondary | ICD-10-CM | POA: Diagnosis not present

## 2020-01-30 DIAGNOSIS — I4821 Permanent atrial fibrillation: Secondary | ICD-10-CM | POA: Diagnosis not present

## 2020-01-30 HISTORY — DX: Unspecified rotator cuff tear or rupture of right shoulder, not specified as traumatic: M75.101

## 2020-02-05 DIAGNOSIS — E109 Type 1 diabetes mellitus without complications: Secondary | ICD-10-CM | POA: Diagnosis not present

## 2020-02-06 ENCOUNTER — Other Ambulatory Visit: Payer: Self-pay

## 2020-02-06 ENCOUNTER — Ambulatory Visit (INDEPENDENT_AMBULATORY_CARE_PROVIDER_SITE_OTHER): Payer: Medicare HMO | Admitting: Podiatry

## 2020-02-06 ENCOUNTER — Encounter: Payer: Self-pay | Admitting: Podiatry

## 2020-02-06 VITALS — Temp 97.3°F

## 2020-02-06 DIAGNOSIS — E119 Type 2 diabetes mellitus without complications: Secondary | ICD-10-CM | POA: Diagnosis not present

## 2020-02-06 DIAGNOSIS — B351 Tinea unguium: Secondary | ICD-10-CM | POA: Diagnosis not present

## 2020-02-06 DIAGNOSIS — G629 Polyneuropathy, unspecified: Secondary | ICD-10-CM

## 2020-02-06 DIAGNOSIS — E1140 Type 2 diabetes mellitus with diabetic neuropathy, unspecified: Secondary | ICD-10-CM | POA: Diagnosis not present

## 2020-02-06 NOTE — Progress Notes (Signed)
This patient returns to my office for at risk foot care.  This patient requires this care by a professional since this patient will be at risk due to having diabetes with neuropathy and coagulation defect.  This patient is unable to cut nails herself since the patient cannot reach her nails.These nails are painful walking and wearing shoes.  This patient presents for at risk foot care today.  General Appearance  Alert, conversant and in no acute stress.  Vascular  Dorsalis pedis and posterior tibial  pulses are palpable  bilaterally.  Capillary return is within normal limits  bilaterally. Temperature is within normal limits  bilaterally.  Neurologic  Senn-Weinstein monofilament wire test diminished  limits  bilaterally. Muscle power within normal limits bilaterally.  Nails Thick disfigured discolored nails with subungual debris  from hallux to fifth toes bilaterally. No evidence of bacterial infection or drainage bilaterally.  Orthopedic  No limitations of motion  feet .  No crepitus or effusions noted.  No bony pathology or digital deformities noted.  Skin  normotropic skin with no porokeratosis noted bilaterally.  No signs of infections or ulcers noted.     Onychomycosis  Pain in right toes  Pain in left toes  Consent was obtained for treatment procedures.   Mechanical debridement of nails 1-5  bilaterally performed with a nail nipper.  Filed with dremel without incident.    Return office visit  3 months.                    Told patient to return for periodic foot care and evaluation due to potential at risk complications.   Sinthia Karabin DPM  

## 2020-02-14 ENCOUNTER — Encounter: Payer: Self-pay | Admitting: *Deleted

## 2020-02-14 ENCOUNTER — Other Ambulatory Visit: Payer: Self-pay | Admitting: *Deleted

## 2020-02-15 ENCOUNTER — Telehealth: Payer: Self-pay | Admitting: *Deleted

## 2020-02-15 ENCOUNTER — Other Ambulatory Visit (HOSPITAL_COMMUNITY): Payer: Self-pay | Admitting: *Deleted

## 2020-02-15 ENCOUNTER — Telehealth: Payer: Self-pay | Admitting: Neurology

## 2020-02-15 ENCOUNTER — Ambulatory Visit: Payer: Medicare HMO | Admitting: Neurology

## 2020-02-15 NOTE — Telephone Encounter (Signed)
This patient came in late for a new patient appointment today, had to be rescheduled.  Apparently got lost trying to get to the office.

## 2020-02-15 NOTE — Telephone Encounter (Signed)
Patient was no show for new patient appointment today. 

## 2020-02-16 ENCOUNTER — Ambulatory Visit (HOSPITAL_COMMUNITY)
Admission: RE | Admit: 2020-02-16 | Discharge: 2020-02-16 | Disposition: A | Discharge status: Home / Self Care | Payer: Medicare HMO | Source: Ambulatory Visit | Attending: Internal Medicine | Admitting: Internal Medicine

## 2020-02-16 ENCOUNTER — Other Ambulatory Visit: Payer: Self-pay

## 2020-02-16 DIAGNOSIS — M810 Age-related osteoporosis without current pathological fracture: Secondary | ICD-10-CM | POA: Diagnosis not present

## 2020-02-16 MED ORDER — DENOSUMAB 60 MG/ML ~~LOC~~ SOSY
60.0000 mg | PREFILLED_SYRINGE | Freq: Once | SUBCUTANEOUS | Status: AC
  Administered 2020-02-16: 60 mg via SUBCUTANEOUS

## 2020-02-16 MED ORDER — DENOSUMAB 60 MG/ML ~~LOC~~ SOSY
PREFILLED_SYRINGE | SUBCUTANEOUS | Status: AC
  Filled 2020-02-16: qty 1

## 2020-02-26 DIAGNOSIS — M75101 Unspecified rotator cuff tear or rupture of right shoulder, not specified as traumatic: Secondary | ICD-10-CM | POA: Diagnosis not present

## 2020-02-26 DIAGNOSIS — I4821 Permanent atrial fibrillation: Secondary | ICD-10-CM | POA: Diagnosis not present

## 2020-02-26 DIAGNOSIS — Z7901 Long term (current) use of anticoagulants: Secondary | ICD-10-CM | POA: Diagnosis not present

## 2020-02-26 DIAGNOSIS — I739 Peripheral vascular disease, unspecified: Secondary | ICD-10-CM | POA: Diagnosis not present

## 2020-02-26 DIAGNOSIS — E1151 Type 2 diabetes mellitus with diabetic peripheral angiopathy without gangrene: Secondary | ICD-10-CM | POA: Diagnosis not present

## 2020-02-26 DIAGNOSIS — L03011 Cellulitis of right finger: Secondary | ICD-10-CM | POA: Diagnosis not present

## 2020-02-26 DIAGNOSIS — R2681 Unsteadiness on feet: Secondary | ICD-10-CM | POA: Diagnosis not present

## 2020-03-06 DIAGNOSIS — Z7901 Long term (current) use of anticoagulants: Secondary | ICD-10-CM | POA: Diagnosis not present

## 2020-03-06 DIAGNOSIS — K009 Disorder of tooth development, unspecified: Secondary | ICD-10-CM | POA: Diagnosis not present

## 2020-03-06 DIAGNOSIS — I4821 Permanent atrial fibrillation: Secondary | ICD-10-CM | POA: Diagnosis not present

## 2020-03-25 DIAGNOSIS — H5203 Hypermetropia, bilateral: Secondary | ICD-10-CM | POA: Diagnosis not present

## 2020-03-25 DIAGNOSIS — H52203 Unspecified astigmatism, bilateral: Secondary | ICD-10-CM | POA: Diagnosis not present

## 2020-03-25 DIAGNOSIS — H524 Presbyopia: Secondary | ICD-10-CM | POA: Diagnosis not present

## 2020-03-25 DIAGNOSIS — H401134 Primary open-angle glaucoma, bilateral, indeterminate stage: Secondary | ICD-10-CM | POA: Diagnosis not present

## 2020-03-25 DIAGNOSIS — Z961 Presence of intraocular lens: Secondary | ICD-10-CM | POA: Diagnosis not present

## 2020-04-11 ENCOUNTER — Ambulatory Visit: Payer: Medicare HMO | Admitting: Neurology

## 2020-04-11 ENCOUNTER — Encounter: Payer: Self-pay | Admitting: Neurology

## 2020-04-11 ENCOUNTER — Other Ambulatory Visit: Payer: Self-pay

## 2020-04-11 VITALS — BP 152/65 | HR 68 | Ht 61.0 in | Wt 155.0 lb

## 2020-04-11 DIAGNOSIS — J449 Chronic obstructive pulmonary disease, unspecified: Secondary | ICD-10-CM | POA: Diagnosis not present

## 2020-04-11 DIAGNOSIS — R269 Unspecified abnormalities of gait and mobility: Secondary | ICD-10-CM

## 2020-04-11 DIAGNOSIS — E538 Deficiency of other specified B group vitamins: Secondary | ICD-10-CM

## 2020-04-11 DIAGNOSIS — R413 Other amnesia: Secondary | ICD-10-CM | POA: Diagnosis not present

## 2020-04-11 DIAGNOSIS — E1140 Type 2 diabetes mellitus with diabetic neuropathy, unspecified: Secondary | ICD-10-CM | POA: Diagnosis not present

## 2020-04-11 HISTORY — DX: Other amnesia: R41.3

## 2020-04-11 HISTORY — DX: Unspecified abnormalities of gait and mobility: R26.9

## 2020-04-11 NOTE — Progress Notes (Signed)
Reason for visit: Gait disorder  Referring physician: Dr. Domingo Pulse is a 86 y.o. female  History of present illness:  [name] [name] is an 86 year old right-handed white female with a history of diabetes and a diabetic neuropathy.  She has restless leg syndrome, she also has COPD with significant shortness of breath and a history of chronic low back pain.  The patient is a poor historian but she comes in with her husband.  She has apparently had some problems with walking for greater than 5 years, she has been using a walker for least 4 years, but she believes that her balance has slowly gotten worse over time.  She fell about 3-1/2 to 4 years ago and fractured her left hip requiring surgery.  She has not had any further falls.  She notes that her hands are numb, this is what bothers her the most.  She has had some numbness in the hands for about 18 months.  She does have some urgency with the bowel but states that the bladder works fairly well.  She does have some back pain without radiation down the legs off and on, she takes Tylenol for this.  She has significant swelling in the legs.  She has had some troubles with memory, she is on doxepin and oxybutynin currently.  A recent CT scan of the brain done a little over a year ago showed only minimal white matter changes.  Her most recent hemoglobin A1c in January 2021 was 7.7.  She comes to this office for further evaluation.  She is getting physical therapy in the home environment for a left rotator cuff tear, she has had physical therapy for her balance about a year and a half ago.  She has what sounds like macular degeneration and has significant visual impairment, she can no longer read a book.  Past Medical History:  Diagnosis Date  . Atrial fibrillation (HCC)    RVR hx.  Chronic Coumadin  . Bacteremia 10/2018  . C. difficile colitis 09/21/2013, 11/2013  . Carotid stenosis    a. Carotid US (10/2013):  bilat 1-39%; f/u 1  year  . Chronic lower back pain   . COPD (chronic obstructive pulmonary disease) ([place])   . Depression   . Diverticulosis of colon without hemorrhage   . GERD (gastroesophageal reflux disease)    with HH  . Glaucoma   . HCAP (healthcare-associated pneumonia) 11/21/2011  . High cholesterol   . Hypertension   . Insomnia   . Metabolic encephalopathy 05/13/3809  . Mitral regurgitation 03/11/2012  . Pancreatitis    ~2008, 11/2011 post ERCP  . Pneumonia 12/2011   "first time I know about"  . PVD (peripheral vascular disease) ([place])   . Sepsis ([place]) 11/27/2013  . SIRS (systemic inflammatory response syndrome) ([place]) 03/13/2012   a/w post ERCP  pancreatitis.   . Syncope   . Type II diabetes mellitus ([place])   . Unsteady gait     Past Surgical History:  Procedure Laterality Date  . APPENDECTOMY  ~ 1960  . CATARACT EXTRACTION W/ INTRAOCULAR LENS  IMPLANT, BILATERAL  2000's  . CHOLECYSTECTOMY  1990's   Lap chole  . COLONOSCOPY  01/2003   diverticulosis, 25mm hyperplastic sigmoid polyp  . COLONOSCOPY WITH PROPOFOL N/A 06/02/2018   Procedure: COLONOSCOPY WITH PROPOFOL;  Surgeon: Lavena Bullion, DO;  Location: [facility];  Service: Gastroenterology;  Laterality: N/A;  . DILATION AND CURETTAGE OF UTERUS    . ERCP  11/13/2011   Procedure: ENDOSCOPIC RETROGRADE CHOLANGIOPANCREATOGRAPHY (ERCP);  Surgeon: Beryle Beams, MD;  Location: [facility] ENDOSCOPY;  Service: Endoscopy;  Laterality: N/A;  . FLEXIBLE SIGMOIDOSCOPY N/A 05/02/2014   Procedure: FLEXIBLE SIGMOIDOSCOPY;  Surgeon: Gatha Mayer, MD;  Location: [facility] ENDOSCOPY;  Service: Endoscopy;  Laterality: N/A;  . HIP ARTHROPLASTY Left 01/07/2018   Procedure: ARTHROPLASTY BIPOLAR HIP (HEMIARTHROPLASTY);  Surgeon: Hiram Gash, MD;  Location: [place];  Service: Orthopedics;  Laterality: Left;  . TONSILLECTOMY AND ADENOIDECTOMY  ~ 1939  . VAGINAL HYSTERECTOMY  ~ 1960's    Family History  Problem Relation Age of Onset  . Stroke Father 9  . Stroke Mother 37    . Diabetes Mellitus II Mother   . Heart disease Mother   . Hypertension Mother   . Diabetes Mellitus II Sister   . Diabetes Mellitus II Brother   . Anesthesia problems Neg Hx   . Hypotension Neg Hx   . Malignant hyperthermia Neg Hx   . Pseudochol deficiency Neg Hx   . Stomach cancer Neg Hx   . Esophageal cancer Neg Hx     Social history:  reports that she has never smoked. She has never used smokeless tobacco. She reports that she does not drink alcohol and does not use drugs.  Medications:  Prior to Admission medications   Medication Sig Start Date End Date Taking? Authorizing Provider  acetaminophen (TYLENOL) 325 MG tablet Take 650 mg by mouth every 6 (six) hours as needed for mild pain.    Yes [provider]  COMBIGAN 0.2-0.5 % ophthalmic solution  01/29/20  Yes [provider]  diltiazem (CARDIZEM CD) 120 MG 24 hr capsule  11/01/19  Yes [provider]  diltiazem (DILT-XR) 120 MG 24 hr capsule TAKE 1 CAPSULE(120 MG) BY MOUTH DAILY Patient taking differently: Take 120 mg by mouth daily.  01/04/18  Yes Minus Breeding, MD  doxepin (SINEQUAN) 10 MG capsule  11/02/19  Yes [provider]  furosemide (LASIX) 40 MG tablet Take 40 mg by mouth. Take one tablet (40 mg) by mouth once a day   Yes [provider]  gabapentin (NEURONTIN) 100 MG capsule  04/08/20  Yes [provider]  gabapentin (NEURONTIN) 300 MG capsule  11/01/19  Yes [provider]  glimepiride (AMARYL) 4 MG tablet  11/01/19  Yes [provider]  guaiFENesin (MUCINEX) 600 MG 12 hr tablet Take 600 mg by mouth 2 (two) times daily.   Yes [provider]  guaiFENesin (ROBITUSSIN) 100 MG/5ML SOLN Take 5 mLs by mouth every 6 (six) hours as needed for cough or to loosen phlegm.   Yes [provider]  Insulin Glargine, 1 Unit Dial, 300 UNIT/ML SOPN Inject into the skin daily.  10/12/19  Yes [provider]  latanoprost (XALATAN) 0.005 %  ophthalmic solution Place 1 drop into both eyes at bedtime.   Yes [provider]  loperamide (LOPERAMIDE A-D) 2 MG tablet Take 1 tablet (2 mg total) by mouth as needed for diarrhea or loose stools (use at bedtime). Patient taking differently: Take 1-2 mg by mouth at bedtime as needed for diarrhea or loose stools.  11/27/14  Yes Gatha Mayer, MD  Melatonin 5 MG TABS Take 5 mg by mouth at bedtime.   Yes [provider]  metoprolol tartrate (LOPRESSOR) 25 MG tablet Take 12.5 mg by mouth 2 (two) times daily.   Yes [provider]  mirtazapine (REMERON) 7.5 MG tablet TAKE 1 TABLET(7.5  MG) BY MOUTH AT BEDTIME Patient taking differently: Take 7.5 mg by mouth at bedtime.  10/06/16  Yes Gatha Mayer, MD  Multiple Vitamin (MULTIVITAMIN WITH MINERALS) TABS Take 1 tablet by mouth daily.   Yes [provider]  oseltamivir (TAMIFLU) 30 MG capsule Take 1 capsule (30 mg total) by mouth 2 (two) times daily. 11/28/18  Yes Debbe Odea, MD  oxybutynin (DITROPAN) 5 MG tablet Take 5 mg by mouth at bedtime.  07/29/16  Yes [provider]  pantoprazole (PROTONIX) 40 MG tablet Take 1 tablet (40 mg total) by mouth 2 (two) times daily. 06/07/18 11/27/27 Yes Arrien, Jimmy Picket, MD  Probiotic Product (PROBIOTIC PO) Take 1 capsule by mouth daily.   Yes [provider]  promethazine (PHENERGAN) 25 MG tablet Take 25 mg by mouth 3 (three) times daily as needed for nausea or vomiting.   Yes [provider]  rOPINIRole (REQUIP) 0.5 MG tablet Take 0.5 mg by mouth See admin instructions. Take 1 tablet at 4 pm and at bedtime 10/19/18  Yes [provider]  rosuvastatin (CRESTOR) 10 MG tablet daily.  11/01/19  Yes [provider]  SYMBICORT 160-4.5 MCG/ACT inhaler Inhale 2 puffs into the lungs 2 (two) times daily.  03/08/14  Yes [provider]  timolol (TIMOPTIC) 0.5 % ophthalmic solution  09/21/19  Yes [provider]  warfarin  (COUMADIN) 2 MG tablet Take 2 mg by mouth daily.   Yes [provider]  warfarin (COUMADIN) 2.5 MG tablet  04/08/20  Yes [provider]      Allergies  Allergen Reactions  . Lactose Intolerance (Gi) Nausea And Vomiting and Other (See Comments)    severe stomach pain  . Penicillins Hives and Itching    "haven't used any since 1970's" - have gotten cephalosporins multiple times Has patient had a PCN reaction causing immediate rash, facial/tongue/throat swelling, SOB or lightheadedness with hypotension: Yes Has patient had a PCN reaction causing severe rash involving mucus membranes or skin necrosis: Unk Has patient had a PCN reaction that required hospitalization: Unk Has patient had a PCN reaction occurring within the last 10 years: No If all of the above answers are "NO", then may proceed with C  . Sulfa Antibiotics Hives and Itching    "haven't used any since 1970's"  . Ciprofloxacin Swelling    Site of swelling not recalled  . Sulfasalazine Hives and Itching    "haven't used any since 1970's"    ROS:  Out of a complete 14 system review of symptoms, the patient complains only of the following symptoms, and all other reviewed systems are negative.  Decreased vision Walking difficulty Numbness in the hands Swelling in the legs  Blood pressure (!) 152/65, pulse 68, height 5\' 1"  (1.549 m), weight 155 lb (70.3 kg).  Physical Exam  General: The patient is alert and cooperative at the time of the examination.  Eyes: Pupils are equal, round, and reactive to light. Discs are flat bilaterally.  Neck: The neck is supple, no carotid bruits are noted.  Respiratory: The respiratory examination is clear.  Cardiovascular: The cardiovascular examination reveals an irregular heart rhythm, no obvious murmurs or rubs are noted.  Skin: Extremities are with 2-3+ edema below the knees bilaterally.  Neurologic Exam  Mental status: The patient is alert and oriented x 3 at  the time of the examination. The patient has apparent normal recent and remote memory, with an apparently normal attention span and concentration ability.  Cranial  nerves: Facial symmetry is present. There is good sensation of the face to pinprick and soft touch bilaterally. The strength of the facial muscles and the muscles to head turning and shoulder shrug are normal bilaterally. Speech is well enunciated, no aphasia or dysarthria is noted. Extraocular movements are full. Visual fields are full. The tongue is midline, and the patient has symmetric elevation of the soft palate. No obvious hearing deficits are noted.  Motor: The motor testing reveals 5 over 5 strength of all 4 extremities, with exception of mild weakness of intrinsic muscles of the hands bilaterally and 4/5 strength with hip flexion on the left and mild bilateral foot drop. Good symmetric motor tone is noted throughout.  Sensory: Sensory testing is intact to pinprick, soft touch, vibration sensation, and position sense on the upper extremities.  With the lower extremities, no definite stocking pattern pinprick sensory deficit was noted, the patient has significant impairment of vibration sensation in both feet but surprisingly has good position sense in both feet.  No evidence of extinction is noted.  Coordination: Cerebellar testing reveals good finger-nose-finger and heel-to-shin bilaterally.  Gait and station: Gait is wide-based, the patient is able to ambulate with a walker.  She is unable to stand with her feet close together.  Tandem gait was not attempted.  Reflexes: Deep tendon reflexes are symmetric, but are depressed bilaterally. Toes are downgoing bilaterally.   CT head 11/26/18:  IMPRESSION: 1. No acute intracranial abnormality identified. 2. Stable chronic microvascular ischemic changes and volume loss of the brain. 3. Paranasal sinus disease with a small sphenoid sinus fluid level which may represent acute  sinusitis.  * CT scan images were reviewed online. I agree with the written report.    Assessment/Plan:  1.  Chronic gait disorder  2.  Diabetes with neuropathy  3.  Macular degeneration, decreased visual acuity  4.  Shortness of breath, dyspnea on exertion  5.  Memory disorder  The patient likely has some gait instability associated with a diabetic neuropathy.  The patient will be sent for blood work today, nerve conduction studies will be done on both legs and EMG on 1 leg.  The patient does not wish to pursue a repeat session of physical therapy for balance training.  Her ability to walk is also impaired by her COPD with shortness of breath and with decreased visual input.  The patient is using a walker.  She does not operate a motor vehicle.  She will follow-up here in 6 months.  She does appear to have some memory issues which will be followed as well.  The patient is on doxepin and oxybutynin which are anticholinergic medications which could worsen cognitive functioning.  Jill Alexanders MD 04/11/2020 9:20 AM  [facility] [address] [place], [place] [address]  Phone [phone] Fax [phone]

## 2020-04-14 LAB — MULTIPLE MYELOMA PANEL, SERUM
Albumin SerPl Elph-Mcnc: 3.8 g/dL (ref 2.9–4.4)
Albumin/Glob SerPl: 1.3 (ref 0.7–1.7)
Alpha 1: 0.2 g/dL (ref 0.0–0.4)
Alpha2 Glob SerPl Elph-Mcnc: 1 g/dL (ref 0.4–1.0)
B-Globulin SerPl Elph-Mcnc: 1.1 g/dL (ref 0.7–1.3)
Gamma Glob SerPl Elph-Mcnc: 0.7 g/dL (ref 0.4–1.8)
Globulin, Total: 3 g/dL (ref 2.2–3.9)
IgA/Immunoglobulin A, Serum: 362 mg/dL (ref 64–422)
IgG (Immunoglobin G), Serum: 837 mg/dL (ref 586–1602)
IgM (Immunoglobulin M), Srm: 30 mg/dL (ref 26–217)
Total Protein: 6.8 g/dL (ref 6.0–8.5)

## 2020-04-14 LAB — ANA W/REFLEX: Anti Nuclear Antibody (ANA): NEGATIVE

## 2020-04-14 LAB — VITAMIN B12: Vitamin B-12: 577 pg/mL (ref 232–1245)

## 2020-04-14 LAB — COPPER, SERUM: Copper: 93 ug/dL (ref 80–158)

## 2020-04-14 LAB — RPR: RPR Ser Ql: NONREACTIVE

## 2020-04-14 LAB — ANGIOTENSIN CONVERTING ENZYME: Angio Convert Enzyme: 36 U/L (ref 14–82)

## 2020-04-14 LAB — B. BURGDORFI ANTIBODIES: Lyme IgG/IgM Ab: <0.91 {ISR} (ref 0.00–0.90)

## 2020-04-15 ENCOUNTER — Telehealth: Payer: Self-pay

## 2020-04-15 NOTE — Telephone Encounter (Signed)
-----   Message from Kathrynn Ducking, MD sent at 04/14/2020  4:15 PM EDT -----  The blood work results are unremarkable. Please call the patient. ----- Message ----- From: Lavone Neri Lab Results In Sent: 04/12/2020   7:38 AM EDT To: Kathrynn Ducking, MD

## 2020-04-15 NOTE — Telephone Encounter (Signed)
I called pts husband on dpr that labs were unremarkable. He will relay message to his wife and verbalized understanding.

## 2020-05-01 DIAGNOSIS — I10 Essential (primary) hypertension: Secondary | ICD-10-CM | POA: Diagnosis not present

## 2020-05-01 DIAGNOSIS — I700 Atherosclerosis of aorta: Secondary | ICD-10-CM | POA: Diagnosis not present

## 2020-05-01 DIAGNOSIS — E11622 Type 2 diabetes mellitus with other skin ulcer: Secondary | ICD-10-CM | POA: Diagnosis not present

## 2020-05-02 DIAGNOSIS — E109 Type 1 diabetes mellitus without complications: Secondary | ICD-10-CM | POA: Diagnosis not present

## 2020-05-06 DIAGNOSIS — I10 Essential (primary) hypertension: Secondary | ICD-10-CM | POA: Diagnosis not present

## 2020-05-06 DIAGNOSIS — I700 Atherosclerosis of aorta: Secondary | ICD-10-CM | POA: Diagnosis not present

## 2020-05-06 DIAGNOSIS — I4891 Unspecified atrial fibrillation: Secondary | ICD-10-CM | POA: Diagnosis not present

## 2020-05-15 DIAGNOSIS — Z794 Long term (current) use of insulin: Secondary | ICD-10-CM | POA: Diagnosis not present

## 2020-05-15 DIAGNOSIS — E1142 Type 2 diabetes mellitus with diabetic polyneuropathy: Secondary | ICD-10-CM | POA: Diagnosis not present

## 2020-05-15 DIAGNOSIS — I10 Essential (primary) hypertension: Secondary | ICD-10-CM | POA: Diagnosis not present

## 2020-05-15 DIAGNOSIS — E782 Mixed hyperlipidemia: Secondary | ICD-10-CM | POA: Diagnosis not present

## 2020-05-20 DIAGNOSIS — E1142 Type 2 diabetes mellitus with diabetic polyneuropathy: Secondary | ICD-10-CM | POA: Diagnosis not present

## 2020-05-20 DIAGNOSIS — Z794 Long term (current) use of insulin: Secondary | ICD-10-CM | POA: Diagnosis not present

## 2020-05-20 DIAGNOSIS — E782 Mixed hyperlipidemia: Secondary | ICD-10-CM | POA: Diagnosis not present

## 2020-05-22 ENCOUNTER — Ambulatory Visit: Payer: Medicare HMO | Admitting: Neurology

## 2020-05-22 ENCOUNTER — Ambulatory Visit (INDEPENDENT_AMBULATORY_CARE_PROVIDER_SITE_OTHER): Payer: Medicare HMO | Admitting: Neurology

## 2020-05-22 ENCOUNTER — Encounter: Payer: Self-pay | Admitting: Neurology

## 2020-05-22 DIAGNOSIS — E1140 Type 2 diabetes mellitus with diabetic neuropathy, unspecified: Secondary | ICD-10-CM

## 2020-05-22 DIAGNOSIS — R269 Unspecified abnormalities of gait and mobility: Secondary | ICD-10-CM

## 2020-05-22 NOTE — Progress Notes (Signed)
Please refer to EMG and nerve conduction procedure note.  

## 2020-05-22 NOTE — Progress Notes (Addendum)
The patient comes in for EMG and nerve conduction study evaluation.  This confirms the presence of a probable diabetic peripheral neuropathy of at least moderate severity.  We had previously discussed getting physical therapy, the patient may be interested in getting home health therapy, they already have an agency coming out of the house.  If they desire physical therapy they will call and let us know what agency they prefer.    [place]    Nerve / Sites Muscle Latency Ref. Amplitude Ref. Rel Amp Segments Distance Velocity Ref. Area    ms ms mV mV %  cm m/s m/s mVms  L Peroneal - EDB     Ankle EDB 4.5 ?6.5 1.5 ?2.0 100 Ankle - EDB 9   4.4     Fib head EDB 10.8  1.3  89.2 Fib head - Ankle 24 38 ?44 4.0     Pop fossa EDB 13.4  0.9  66.6 Pop fossa - Fib head 10 38 ?44 2.7         Pop fossa - Ankle      R Peroneal - EDB     Ankle EDB 4.9 ?6.5 0.9 ?2.0 100 Ankle - EDB 9   2.9     Fib head EDB NR  NR  NR Fib head - Ankle 23 NR ?44 NR     Pop fossa EDB 12.0  0.8   Pop fossa - Fib head 10 NR ?44 2.9         Pop fossa - Ankle      L Tibial - AH     Ankle AH 3.5 ?5.8 0.6 ?4.0 100 Ankle - AH 9   1.9     Pop fossa AH 12.1  0.3  49.9 Pop fossa - Ankle 35 41 ?41 2.3  R Tibial - AH     Ankle AH 4.3 ?5.8 1.8 ?4.0 100 Ankle - AH 9   6.7     Pop fossa AH 12.7  0.9  48.8 Pop fossa - Ankle 35 42 ?41 2.5             SNC    Nerve / Sites Rec. Site Peak Lat Ref.  Amp Ref. Segments Distance    ms ms V V  cm  L Sural - Ankle (Calf)     Calf Ankle NR ?4.4 NR ?6 Calf - Ankle 14  R Sural - Ankle (Calf)     Calf Ankle NR ?4.4 NR ?6 Calf - Ankle 14  L Superficial peroneal - Ankle     Lat leg Ankle NR ?4.4 NR ?6 Lat leg - Ankle 14  R Superficial peroneal - Ankle     Lat leg Ankle NR ?4.4 NR ?6 Lat leg - Ankle 14             F  Wave    Nerve F Lat Ref.   ms ms  L Tibial - AH 51.8 ?56.0  R Tibial - AH 50.8 ?56.0

## 2020-05-22 NOTE — Procedures (Signed)
     HISTORY:  [name] [name] is an 86 year old patient with a history of diabetes who has had some difficulty with gait instability.  She has mild bilateral foot drop, some weakness with hip flexion on the left.  She is being evaluated for her walking issue.  NERVE CONDUCTION STUDIES:  Nerve conduction studies were performed on both lower extremities.  The distal motor latencies for the peroneal and posterior tibial nerves were normal bilaterally with low motor amplitudes seen for these nerves bilaterally.  Slowing was seen for the left peroneal nerve, but no response was seen for the right peroneal nerve following stimulation at the popliteal fossa and fibular head.  The nerve conduction velocities for the posterior tibial nerves were borderline normal bilaterally.  No response was seen for the sural or peroneal sensory latencies bilaterally.  The F-wave latencies for the posterior tibial nerves were within normal limits bilaterally.  EMG STUDIES:  EMG study was performed on the right lower extremity:  The tibialis anterior muscle reveals 2 to 4K motor units with full recruitment. No fibrillations or positive waves were seen. The peroneus tertius muscle reveals 2 to 4K motor units with decreased recruitment. No fibrillations or positive waves were seen. The medial gastrocnemius muscle reveals 1 to 5K motor units with slightly decreased recruitment. No fibrillations or positive waves were seen. The vastus lateralis muscle reveals 2 to 4K motor units with full recruitment. No fibrillations or positive waves were seen. The iliopsoas muscle reveals 2 to 4K motor units with full recruitment. No fibrillations or positive waves were seen. The biceps femoris muscle (long head) reveals 2 to 4K motor units with full recruitment. No fibrillations or positive waves were seen. The lumbosacral paraspinal muscles were tested at 3 levels, and revealed no abnormalities of insertional activity at all 3  levels tested. There was good relaxation.   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  EMG evaluation of the right lower extremity shows distal chronic neuropathic denervation consistent with the diagnosis of peripheral neuropathy, there does not appear to be evidence of an overlying lumbosacral radiculopathy.  Jill Alexanders MD 05/22/2020 4:06 PM  [facility] [address] [place], [place] [address]  Phone [phone] Fax [phone]

## 2020-06-05 DIAGNOSIS — I4821 Permanent atrial fibrillation: Secondary | ICD-10-CM | POA: Diagnosis not present

## 2020-06-05 DIAGNOSIS — Z7901 Long term (current) use of anticoagulants: Secondary | ICD-10-CM | POA: Diagnosis not present

## 2020-06-11 ENCOUNTER — Telehealth: Payer: Self-pay | Admitting: Neurology

## 2020-06-11 NOTE — Telephone Encounter (Signed)
Arlana Lindau Physical Therapist [facility] called wanting to clarify if the pt was to get in home PT or out patient PT. Pt was not able to be clear on relaying this message to [facility]. Please advise.

## 2020-06-11 NOTE — Telephone Encounter (Signed)
I returned the call to PT. At last office visit on 04/11/20, the following information was documented:  The patient does not wish to pursue a repeat session of physical therapy for balance training. Arlana Lindau verbalized understanding and will make a note in the patient's chart.

## 2020-06-15 DIAGNOSIS — F329 Major depressive disorder, single episode, unspecified: Secondary | ICD-10-CM | POA: Diagnosis not present

## 2020-06-15 DIAGNOSIS — J449 Chronic obstructive pulmonary disease, unspecified: Secondary | ICD-10-CM | POA: Diagnosis not present

## 2020-06-15 DIAGNOSIS — M1990 Unspecified osteoarthritis, unspecified site: Secondary | ICD-10-CM | POA: Diagnosis not present

## 2020-06-15 DIAGNOSIS — I4891 Unspecified atrial fibrillation: Secondary | ICD-10-CM | POA: Diagnosis not present

## 2020-06-15 DIAGNOSIS — M7581 Other shoulder lesions, right shoulder: Secondary | ICD-10-CM | POA: Diagnosis not present

## 2020-06-15 DIAGNOSIS — I119 Hypertensive heart disease without heart failure: Secondary | ICD-10-CM | POA: Diagnosis not present

## 2020-06-15 DIAGNOSIS — E1140 Type 2 diabetes mellitus with diabetic neuropathy, unspecified: Secondary | ICD-10-CM | POA: Diagnosis not present

## 2020-06-15 DIAGNOSIS — F419 Anxiety disorder, unspecified: Secondary | ICD-10-CM | POA: Diagnosis not present

## 2020-06-15 DIAGNOSIS — G9340 Encephalopathy, unspecified: Secondary | ICD-10-CM | POA: Diagnosis not present

## 2020-06-17 ENCOUNTER — Telehealth: Payer: Self-pay | Admitting: Cardiology

## 2020-06-17 NOTE — Telephone Encounter (Signed)
Follow Up:     Husband wants to know if clearance had been received from pt's dentist?

## 2020-06-17 NOTE — Telephone Encounter (Signed)
Returned call back to pt.  Left a detailed message for pt regarding surgical clearance and that we haven't received anything on her behalf, and asked that they contact the dentist / surgeon's office and request they send it over.

## 2020-06-17 NOTE — Telephone Encounter (Signed)
Please check with the patient the detail regarding cardiac clearance and obtain formal clearance from the dentist office. I do not see a clearance recorded in our system

## 2020-06-19 DIAGNOSIS — G9340 Encephalopathy, unspecified: Secondary | ICD-10-CM | POA: Diagnosis not present

## 2020-06-19 DIAGNOSIS — I119 Hypertensive heart disease without heart failure: Secondary | ICD-10-CM | POA: Diagnosis not present

## 2020-06-19 DIAGNOSIS — M1990 Unspecified osteoarthritis, unspecified site: Secondary | ICD-10-CM | POA: Diagnosis not present

## 2020-06-19 DIAGNOSIS — F419 Anxiety disorder, unspecified: Secondary | ICD-10-CM | POA: Diagnosis not present

## 2020-06-19 DIAGNOSIS — E1140 Type 2 diabetes mellitus with diabetic neuropathy, unspecified: Secondary | ICD-10-CM | POA: Diagnosis not present

## 2020-06-19 DIAGNOSIS — J449 Chronic obstructive pulmonary disease, unspecified: Secondary | ICD-10-CM | POA: Diagnosis not present

## 2020-06-19 DIAGNOSIS — I4891 Unspecified atrial fibrillation: Secondary | ICD-10-CM | POA: Diagnosis not present

## 2020-06-19 DIAGNOSIS — F329 Major depressive disorder, single episode, unspecified: Secondary | ICD-10-CM | POA: Diagnosis not present

## 2020-06-19 DIAGNOSIS — M7581 Other shoulder lesions, right shoulder: Secondary | ICD-10-CM | POA: Diagnosis not present

## 2020-06-19 NOTE — Telephone Encounter (Signed)
Left message for Kinnelon to call back to confirm DOB as it is not listed on form received and also to inform us of # of extractions

## 2020-06-21 ENCOUNTER — Other Ambulatory Visit: Payer: Self-pay | Admitting: Sleep Medicine

## 2020-06-21 ENCOUNTER — Other Ambulatory Visit: Payer: Medicare HMO

## 2020-06-21 DIAGNOSIS — Z20822 Contact with and (suspected) exposure to covid-19: Secondary | ICD-10-CM | POA: Diagnosis not present

## 2020-06-23 ENCOUNTER — Telehealth: Payer: Self-pay

## 2020-06-23 ENCOUNTER — Other Ambulatory Visit: Payer: Self-pay | Admitting: Physician Assistant

## 2020-06-23 ENCOUNTER — Ambulatory Visit (HOSPITAL_COMMUNITY)
Admission: RE | Admit: 2020-06-23 | Discharge: 2020-06-23 | Disposition: A | Discharge status: Home / Self Care | Payer: Medicare Other | Source: Ambulatory Visit | Attending: Pulmonary Disease | Admitting: Pulmonary Disease

## 2020-06-23 DIAGNOSIS — I4891 Unspecified atrial fibrillation: Secondary | ICD-10-CM | POA: Diagnosis present

## 2020-06-23 DIAGNOSIS — Z23 Encounter for immunization: Secondary | ICD-10-CM | POA: Insufficient documentation

## 2020-06-23 DIAGNOSIS — J449 Chronic obstructive pulmonary disease, unspecified: Secondary | ICD-10-CM | POA: Insufficient documentation

## 2020-06-23 DIAGNOSIS — E119 Type 2 diabetes mellitus without complications: Secondary | ICD-10-CM | POA: Diagnosis present

## 2020-06-23 DIAGNOSIS — U071 COVID-19: Secondary | ICD-10-CM

## 2020-06-23 DIAGNOSIS — I10 Essential (primary) hypertension: Secondary | ICD-10-CM

## 2020-06-23 MED ORDER — FAMOTIDINE IN NACL 20-0.9 MG/50ML-% IV SOLN: 20.0000 mg | Freq: Once | INTRAVENOUS | Status: DC | PRN

## 2020-06-23 MED ORDER — SODIUM CHLORIDE 0.9 % IV SOLN: INTRAVENOUS | Status: DC | PRN

## 2020-06-23 MED ORDER — SODIUM CHLORIDE 0.9 % IV SOLN
1200.0000 mg | Freq: Once | INTRAVENOUS | Status: AC
  Administered 2020-06-23: 1200 mg via INTRAVENOUS

## 2020-06-23 MED ORDER — METHYLPREDNISOLONE SODIUM SUCC 125 MG IJ SOLR: 125.0000 mg | Freq: Once | INTRAMUSCULAR | Status: DC | PRN

## 2020-06-23 MED ORDER — EPINEPHRINE 0.3 MG/0.3ML IJ SOAJ: 0.3000 mg | Freq: Once | INTRAMUSCULAR | Status: DC | PRN

## 2020-06-23 MED ORDER — ALBUTEROL SULFATE HFA 108 (90 BASE) MCG/ACT IN AERS: 2.0000 | INHALATION_SPRAY | Freq: Once | RESPIRATORY_TRACT | Status: DC | PRN

## 2020-06-23 MED ORDER — DIPHENHYDRAMINE HCL 50 MG/ML IJ SOLN: 50.0000 mg | Freq: Once | INTRAMUSCULAR | Status: DC | PRN

## 2020-06-23 NOTE — Discharge Instructions (Signed)

## 2020-06-23 NOTE — Progress Notes (Signed)
  Diagnosis: COVID-19  Physician: DR.Wright  Procedure: [facility] Med: casirivimab\imdevimab infusion - Provided patient with casirivimab\imdevimab fact sheet for patients, parents and caregivers prior to infusion.  Complications: No immediate complications noted.  Discharge: Discharged home   Ludwig Lean 06/23/2020

## 2020-06-23 NOTE — Progress Notes (Signed)
I connected by phone with [name] [name] on 06/23/2020 at 9:53 AM to discuss the potential use of a new treatment for mild to moderate COVID-19 viral infection in non-hospitalized patients.  This patient is a 87 y.o. female that meets the FDA criteria for Emergency Use Authorization of COVID monoclonal antibody casirivimab/imdevimab.  Has a (+) direct SARS-CoV-2 viral test result  Has mild or moderate COVID-19   Is NOT hospitalized due to COVID-19  Is within 10 days of symptom onset  Has at least one of the high risk factor(s) for progression to severe COVID-19 and/or hospitalization as defined in EUA.  Specific high risk criteria : Older age (>/= 84 yo), Diabetes, Cardiovascular disease or hypertension and Chronic Lung Disease   I have spoken and communicated the following to the patient or parent/caregiver regarding COVID monoclonal antibody treatment:  1. FDA has authorized the emergency use for the treatment of mild to moderate COVID-19 in adults and pediatric patients with positive results of direct SARS-CoV-2 viral testing who are 20 years of age and older weighing at least 40 kg, and who are at high risk for progressing to severe COVID-19 and/or hospitalization.  2. The significant known and potential risks and benefits of COVID monoclonal antibody, and the extent to which such potential risks and benefits are unknown.  3. Information on available alternative treatments and the risks and benefits of those alternatives, including clinical trials.  4. Patients treated with COVID monoclonal antibody should continue to self-isolate and use infection control measures (e.g., wear mask, isolate, social distance, avoid sharing personal items, clean and disinfect "high touch" surfaces, and frequent handwashing) according to CDC guidelines.   5. The patient or parent/caregiver has the option to accept or refuse COVID monoclonal antibody treatment.  After reviewing this information with  the patient, The patient agreed to proceed with receiving casirivimab\imdevimab infusion and will be provided a copy of the Fact sheet prior to receiving the infusion.  Sx onset 9/12. Set up for infusion on 9/19 @ 4:30pm. Directions given to [facility]. Pt is aware that insurance will be charged an infusion fee. Pt is fully vaccinated.   [name] Form 06/23/2020 9:53 AM

## 2020-06-23 NOTE — Telephone Encounter (Signed)
Called husband and informed him that pt's results are not back.

## 2020-06-24 DIAGNOSIS — Z1159 Encounter for screening for other viral diseases: Secondary | ICD-10-CM | POA: Diagnosis not present

## 2020-06-24 LAB — NOVEL CORONAVIRUS, NAA: SARS-CoV-2, NAA: DETECTED — AB

## 2020-06-25 ENCOUNTER — Other Ambulatory Visit: Payer: Self-pay | Admitting: Nurse Practitioner

## 2020-06-28 DIAGNOSIS — Z1159 Encounter for screening for other viral diseases: Secondary | ICD-10-CM | POA: Diagnosis not present

## 2020-06-29 ENCOUNTER — Other Ambulatory Visit (HOSPITAL_COMMUNITY): Payer: Self-pay | Admitting: *Deleted

## 2020-07-01 DIAGNOSIS — I4891 Unspecified atrial fibrillation: Secondary | ICD-10-CM | POA: Diagnosis not present

## 2020-07-02 ENCOUNTER — Encounter (HOSPITAL_COMMUNITY): Payer: Medicare HMO

## 2020-07-03 DIAGNOSIS — M7581 Other shoulder lesions, right shoulder: Secondary | ICD-10-CM | POA: Diagnosis not present

## 2020-07-03 DIAGNOSIS — J449 Chronic obstructive pulmonary disease, unspecified: Secondary | ICD-10-CM | POA: Diagnosis not present

## 2020-07-03 DIAGNOSIS — E1140 Type 2 diabetes mellitus with diabetic neuropathy, unspecified: Secondary | ICD-10-CM | POA: Diagnosis not present

## 2020-07-03 DIAGNOSIS — F329 Major depressive disorder, single episode, unspecified: Secondary | ICD-10-CM | POA: Diagnosis not present

## 2020-07-03 DIAGNOSIS — M1990 Unspecified osteoarthritis, unspecified site: Secondary | ICD-10-CM | POA: Diagnosis not present

## 2020-07-03 DIAGNOSIS — F419 Anxiety disorder, unspecified: Secondary | ICD-10-CM | POA: Diagnosis not present

## 2020-07-03 DIAGNOSIS — I119 Hypertensive heart disease without heart failure: Secondary | ICD-10-CM | POA: Diagnosis not present

## 2020-07-03 DIAGNOSIS — G9340 Encephalopathy, unspecified: Secondary | ICD-10-CM | POA: Diagnosis not present

## 2020-07-03 DIAGNOSIS — I4891 Unspecified atrial fibrillation: Secondary | ICD-10-CM | POA: Diagnosis not present

## 2020-07-04 DIAGNOSIS — G9340 Encephalopathy, unspecified: Secondary | ICD-10-CM | POA: Diagnosis not present

## 2020-07-04 DIAGNOSIS — F329 Major depressive disorder, single episode, unspecified: Secondary | ICD-10-CM | POA: Diagnosis not present

## 2020-07-04 DIAGNOSIS — I4891 Unspecified atrial fibrillation: Secondary | ICD-10-CM | POA: Diagnosis not present

## 2020-07-04 DIAGNOSIS — I119 Hypertensive heart disease without heart failure: Secondary | ICD-10-CM | POA: Diagnosis not present

## 2020-07-04 DIAGNOSIS — F419 Anxiety disorder, unspecified: Secondary | ICD-10-CM | POA: Diagnosis not present

## 2020-07-04 DIAGNOSIS — E1140 Type 2 diabetes mellitus with diabetic neuropathy, unspecified: Secondary | ICD-10-CM | POA: Diagnosis not present

## 2020-07-04 DIAGNOSIS — M7581 Other shoulder lesions, right shoulder: Secondary | ICD-10-CM | POA: Diagnosis not present

## 2020-07-04 DIAGNOSIS — J449 Chronic obstructive pulmonary disease, unspecified: Secondary | ICD-10-CM | POA: Diagnosis not present

## 2020-07-04 DIAGNOSIS — M1990 Unspecified osteoarthritis, unspecified site: Secondary | ICD-10-CM | POA: Diagnosis not present

## 2020-07-08 DIAGNOSIS — I10 Essential (primary) hypertension: Secondary | ICD-10-CM | POA: Diagnosis not present

## 2020-07-08 DIAGNOSIS — J449 Chronic obstructive pulmonary disease, unspecified: Secondary | ICD-10-CM | POA: Diagnosis not present

## 2020-07-08 DIAGNOSIS — I4891 Unspecified atrial fibrillation: Secondary | ICD-10-CM | POA: Diagnosis not present

## 2020-07-08 DIAGNOSIS — E11622 Type 2 diabetes mellitus with other skin ulcer: Secondary | ICD-10-CM | POA: Diagnosis not present

## 2020-07-10 DIAGNOSIS — F419 Anxiety disorder, unspecified: Secondary | ICD-10-CM | POA: Diagnosis not present

## 2020-07-10 DIAGNOSIS — E1140 Type 2 diabetes mellitus with diabetic neuropathy, unspecified: Secondary | ICD-10-CM | POA: Diagnosis not present

## 2020-07-10 DIAGNOSIS — I119 Hypertensive heart disease without heart failure: Secondary | ICD-10-CM | POA: Diagnosis not present

## 2020-07-10 DIAGNOSIS — G9340 Encephalopathy, unspecified: Secondary | ICD-10-CM | POA: Diagnosis not present

## 2020-07-10 DIAGNOSIS — I4891 Unspecified atrial fibrillation: Secondary | ICD-10-CM | POA: Diagnosis not present

## 2020-07-10 DIAGNOSIS — M1990 Unspecified osteoarthritis, unspecified site: Secondary | ICD-10-CM | POA: Diagnosis not present

## 2020-07-10 DIAGNOSIS — J449 Chronic obstructive pulmonary disease, unspecified: Secondary | ICD-10-CM | POA: Diagnosis not present

## 2020-07-10 DIAGNOSIS — F329 Major depressive disorder, single episode, unspecified: Secondary | ICD-10-CM | POA: Diagnosis not present

## 2020-07-10 DIAGNOSIS — M7581 Other shoulder lesions, right shoulder: Secondary | ICD-10-CM | POA: Diagnosis not present

## 2020-07-11 DIAGNOSIS — M1990 Unspecified osteoarthritis, unspecified site: Secondary | ICD-10-CM | POA: Diagnosis not present

## 2020-07-11 DIAGNOSIS — F329 Major depressive disorder, single episode, unspecified: Secondary | ICD-10-CM | POA: Diagnosis not present

## 2020-07-11 DIAGNOSIS — M7581 Other shoulder lesions, right shoulder: Secondary | ICD-10-CM | POA: Diagnosis not present

## 2020-07-11 DIAGNOSIS — F419 Anxiety disorder, unspecified: Secondary | ICD-10-CM | POA: Diagnosis not present

## 2020-07-11 DIAGNOSIS — J449 Chronic obstructive pulmonary disease, unspecified: Secondary | ICD-10-CM | POA: Diagnosis not present

## 2020-07-11 DIAGNOSIS — E1140 Type 2 diabetes mellitus with diabetic neuropathy, unspecified: Secondary | ICD-10-CM | POA: Diagnosis not present

## 2020-07-11 DIAGNOSIS — I119 Hypertensive heart disease without heart failure: Secondary | ICD-10-CM | POA: Diagnosis not present

## 2020-07-11 DIAGNOSIS — I4891 Unspecified atrial fibrillation: Secondary | ICD-10-CM | POA: Diagnosis not present

## 2020-07-11 DIAGNOSIS — G9340 Encephalopathy, unspecified: Secondary | ICD-10-CM | POA: Diagnosis not present

## 2020-07-16 DIAGNOSIS — M1990 Unspecified osteoarthritis, unspecified site: Secondary | ICD-10-CM | POA: Diagnosis not present

## 2020-07-16 DIAGNOSIS — M7581 Other shoulder lesions, right shoulder: Secondary | ICD-10-CM | POA: Diagnosis not present

## 2020-07-16 DIAGNOSIS — J449 Chronic obstructive pulmonary disease, unspecified: Secondary | ICD-10-CM | POA: Diagnosis not present

## 2020-07-16 DIAGNOSIS — F329 Major depressive disorder, single episode, unspecified: Secondary | ICD-10-CM | POA: Diagnosis not present

## 2020-07-16 DIAGNOSIS — I4891 Unspecified atrial fibrillation: Secondary | ICD-10-CM | POA: Diagnosis not present

## 2020-07-16 DIAGNOSIS — G9340 Encephalopathy, unspecified: Secondary | ICD-10-CM | POA: Diagnosis not present

## 2020-07-16 DIAGNOSIS — E1140 Type 2 diabetes mellitus with diabetic neuropathy, unspecified: Secondary | ICD-10-CM | POA: Diagnosis not present

## 2020-07-16 DIAGNOSIS — I119 Hypertensive heart disease without heart failure: Secondary | ICD-10-CM | POA: Diagnosis not present

## 2020-07-16 DIAGNOSIS — F419 Anxiety disorder, unspecified: Secondary | ICD-10-CM | POA: Diagnosis not present

## 2020-07-18 DIAGNOSIS — I4891 Unspecified atrial fibrillation: Secondary | ICD-10-CM | POA: Diagnosis not present

## 2020-07-18 DIAGNOSIS — J449 Chronic obstructive pulmonary disease, unspecified: Secondary | ICD-10-CM | POA: Diagnosis not present

## 2020-07-18 DIAGNOSIS — G9340 Encephalopathy, unspecified: Secondary | ICD-10-CM | POA: Diagnosis not present

## 2020-07-18 DIAGNOSIS — F329 Major depressive disorder, single episode, unspecified: Secondary | ICD-10-CM | POA: Diagnosis not present

## 2020-07-18 DIAGNOSIS — M7581 Other shoulder lesions, right shoulder: Secondary | ICD-10-CM | POA: Diagnosis not present

## 2020-07-18 DIAGNOSIS — M1990 Unspecified osteoarthritis, unspecified site: Secondary | ICD-10-CM | POA: Diagnosis not present

## 2020-07-18 DIAGNOSIS — I119 Hypertensive heart disease without heart failure: Secondary | ICD-10-CM | POA: Diagnosis not present

## 2020-07-18 DIAGNOSIS — F419 Anxiety disorder, unspecified: Secondary | ICD-10-CM | POA: Diagnosis not present

## 2020-07-18 DIAGNOSIS — E1140 Type 2 diabetes mellitus with diabetic neuropathy, unspecified: Secondary | ICD-10-CM | POA: Diagnosis not present

## 2020-07-25 DIAGNOSIS — I4821 Permanent atrial fibrillation: Secondary | ICD-10-CM | POA: Diagnosis not present

## 2020-07-25 DIAGNOSIS — E1140 Type 2 diabetes mellitus with diabetic neuropathy, unspecified: Secondary | ICD-10-CM | POA: Diagnosis not present

## 2020-07-25 DIAGNOSIS — G9340 Encephalopathy, unspecified: Secondary | ICD-10-CM | POA: Diagnosis not present

## 2020-07-25 DIAGNOSIS — I4891 Unspecified atrial fibrillation: Secondary | ICD-10-CM | POA: Diagnosis not present

## 2020-07-25 DIAGNOSIS — F329 Major depressive disorder, single episode, unspecified: Secondary | ICD-10-CM | POA: Diagnosis not present

## 2020-07-25 DIAGNOSIS — I10 Essential (primary) hypertension: Secondary | ICD-10-CM | POA: Diagnosis not present

## 2020-07-25 DIAGNOSIS — Z23 Encounter for immunization: Secondary | ICD-10-CM | POA: Diagnosis not present

## 2020-07-25 DIAGNOSIS — E1151 Type 2 diabetes mellitus with diabetic peripheral angiopathy without gangrene: Secondary | ICD-10-CM | POA: Diagnosis not present

## 2020-07-25 DIAGNOSIS — Z7901 Long term (current) use of anticoagulants: Secondary | ICD-10-CM | POA: Diagnosis not present

## 2020-07-25 DIAGNOSIS — J449 Chronic obstructive pulmonary disease, unspecified: Secondary | ICD-10-CM | POA: Diagnosis not present

## 2020-07-25 DIAGNOSIS — R2681 Unsteadiness on feet: Secondary | ICD-10-CM | POA: Diagnosis not present

## 2020-07-25 DIAGNOSIS — M7581 Other shoulder lesions, right shoulder: Secondary | ICD-10-CM | POA: Diagnosis not present

## 2020-07-25 DIAGNOSIS — I119 Hypertensive heart disease without heart failure: Secondary | ICD-10-CM | POA: Diagnosis not present

## 2020-07-25 DIAGNOSIS — M1990 Unspecified osteoarthritis, unspecified site: Secondary | ICD-10-CM | POA: Diagnosis not present

## 2020-07-25 DIAGNOSIS — F419 Anxiety disorder, unspecified: Secondary | ICD-10-CM | POA: Diagnosis not present

## 2020-07-25 DIAGNOSIS — E785 Hyperlipidemia, unspecified: Secondary | ICD-10-CM | POA: Diagnosis not present

## 2020-07-25 DIAGNOSIS — I739 Peripheral vascular disease, unspecified: Secondary | ICD-10-CM | POA: Diagnosis not present

## 2020-07-26 ENCOUNTER — Ambulatory Visit: Payer: Medicare HMO | Admitting: Podiatry

## 2020-07-26 ENCOUNTER — Encounter: Payer: Self-pay | Admitting: Podiatry

## 2020-07-26 ENCOUNTER — Other Ambulatory Visit: Payer: Self-pay

## 2020-07-26 DIAGNOSIS — E1140 Type 2 diabetes mellitus with diabetic neuropathy, unspecified: Secondary | ICD-10-CM

## 2020-07-26 DIAGNOSIS — B351 Tinea unguium: Secondary | ICD-10-CM

## 2020-07-26 DIAGNOSIS — Z961 Presence of intraocular lens: Secondary | ICD-10-CM | POA: Diagnosis not present

## 2020-07-26 DIAGNOSIS — G629 Polyneuropathy, unspecified: Secondary | ICD-10-CM | POA: Diagnosis not present

## 2020-07-26 DIAGNOSIS — H401134 Primary open-angle glaucoma, bilateral, indeterminate stage: Secondary | ICD-10-CM | POA: Diagnosis not present

## 2020-07-26 NOTE — Progress Notes (Signed)
This patient returns to my office for at risk foot care.  This patient requires this care by a professional since this patient will be at risk due to having diabetes with neuropathy and coagulation defect.  This patient is unable to cut nails herself since the patient cannot reach her nails.These nails are painful walking and wearing shoes.  This patient presents for at risk foot care today.  General Appearance  Alert, conversant and in no acute stress.  Vascular  Dorsalis pedis and posterior tibial  pulses are palpable  bilaterally.  Capillary return is within normal limits  bilaterally. Temperature is within normal limits  bilaterally.  Neurologic  Senn-Weinstein monofilament wire test diminished  limits  bilaterally. Muscle power within normal limits bilaterally.  Nails Thick disfigured discolored nails with subungual debris  from hallux to fifth toes bilaterally. No evidence of bacterial infection or drainage bilaterally.  Orthopedic  No limitations of motion  feet .  No crepitus or effusions noted.  No bony pathology or digital deformities noted.  Skin  normotropic skin with no porokeratosis noted bilaterally.  No signs of infections or ulcers noted.     Onychomycosis  Pain in right toes  Pain in left toes  Consent was obtained for treatment procedures.   Mechanical debridement of nails 1-5  bilaterally performed with a nail nipper.  Filed with dremel without incident.    Return office visit  3 months.                    Told patient to return for periodic foot care and evaluation due to potential at risk complications.   Gardiner Barefoot DPM

## 2020-07-30 DIAGNOSIS — E109 Type 1 diabetes mellitus without complications: Secondary | ICD-10-CM | POA: Diagnosis not present

## 2020-08-01 DIAGNOSIS — I119 Hypertensive heart disease without heart failure: Secondary | ICD-10-CM | POA: Diagnosis not present

## 2020-08-01 DIAGNOSIS — E1140 Type 2 diabetes mellitus with diabetic neuropathy, unspecified: Secondary | ICD-10-CM | POA: Diagnosis not present

## 2020-08-01 DIAGNOSIS — F329 Major depressive disorder, single episode, unspecified: Secondary | ICD-10-CM | POA: Diagnosis not present

## 2020-08-01 DIAGNOSIS — J449 Chronic obstructive pulmonary disease, unspecified: Secondary | ICD-10-CM | POA: Diagnosis not present

## 2020-08-01 DIAGNOSIS — I4891 Unspecified atrial fibrillation: Secondary | ICD-10-CM | POA: Diagnosis not present

## 2020-08-01 DIAGNOSIS — M1990 Unspecified osteoarthritis, unspecified site: Secondary | ICD-10-CM | POA: Diagnosis not present

## 2020-08-01 DIAGNOSIS — F419 Anxiety disorder, unspecified: Secondary | ICD-10-CM | POA: Diagnosis not present

## 2020-08-01 DIAGNOSIS — M7581 Other shoulder lesions, right shoulder: Secondary | ICD-10-CM | POA: Diagnosis not present

## 2020-08-01 DIAGNOSIS — G9340 Encephalopathy, unspecified: Secondary | ICD-10-CM | POA: Diagnosis not present

## 2020-08-08 DIAGNOSIS — M7581 Other shoulder lesions, right shoulder: Secondary | ICD-10-CM | POA: Diagnosis not present

## 2020-08-08 DIAGNOSIS — F329 Major depressive disorder, single episode, unspecified: Secondary | ICD-10-CM | POA: Diagnosis not present

## 2020-08-08 DIAGNOSIS — J449 Chronic obstructive pulmonary disease, unspecified: Secondary | ICD-10-CM | POA: Diagnosis not present

## 2020-08-08 DIAGNOSIS — E1140 Type 2 diabetes mellitus with diabetic neuropathy, unspecified: Secondary | ICD-10-CM | POA: Diagnosis not present

## 2020-08-08 DIAGNOSIS — M1990 Unspecified osteoarthritis, unspecified site: Secondary | ICD-10-CM | POA: Diagnosis not present

## 2020-08-08 DIAGNOSIS — I4891 Unspecified atrial fibrillation: Secondary | ICD-10-CM | POA: Diagnosis not present

## 2020-08-08 DIAGNOSIS — G9340 Encephalopathy, unspecified: Secondary | ICD-10-CM | POA: Diagnosis not present

## 2020-08-08 DIAGNOSIS — F419 Anxiety disorder, unspecified: Secondary | ICD-10-CM | POA: Diagnosis not present

## 2020-08-08 DIAGNOSIS — I119 Hypertensive heart disease without heart failure: Secondary | ICD-10-CM | POA: Diagnosis not present

## 2020-08-19 DIAGNOSIS — E11622 Type 2 diabetes mellitus with other skin ulcer: Secondary | ICD-10-CM | POA: Diagnosis not present

## 2020-08-19 DIAGNOSIS — N390 Urinary tract infection, site not specified: Secondary | ICD-10-CM | POA: Diagnosis not present

## 2020-08-19 DIAGNOSIS — I700 Atherosclerosis of aorta: Secondary | ICD-10-CM | POA: Diagnosis not present

## 2020-08-19 DIAGNOSIS — I10 Essential (primary) hypertension: Secondary | ICD-10-CM | POA: Diagnosis not present

## 2020-08-28 DIAGNOSIS — H401132 Primary open-angle glaucoma, bilateral, moderate stage: Secondary | ICD-10-CM | POA: Diagnosis not present

## 2020-09-11 DIAGNOSIS — E109 Type 1 diabetes mellitus without complications: Secondary | ICD-10-CM | POA: Diagnosis not present

## 2020-09-16 DIAGNOSIS — E782 Mixed hyperlipidemia: Secondary | ICD-10-CM | POA: Diagnosis not present

## 2020-09-16 DIAGNOSIS — I10 Essential (primary) hypertension: Secondary | ICD-10-CM | POA: Diagnosis not present

## 2020-09-16 DIAGNOSIS — Z794 Long term (current) use of insulin: Secondary | ICD-10-CM | POA: Diagnosis not present

## 2020-09-16 DIAGNOSIS — E1142 Type 2 diabetes mellitus with diabetic polyneuropathy: Secondary | ICD-10-CM | POA: Diagnosis not present

## 2020-10-08 DIAGNOSIS — Z7901 Long term (current) use of anticoagulants: Secondary | ICD-10-CM | POA: Diagnosis not present

## 2020-10-08 DIAGNOSIS — N3281 Overactive bladder: Secondary | ICD-10-CM | POA: Diagnosis not present

## 2020-10-08 DIAGNOSIS — I4821 Permanent atrial fibrillation: Secondary | ICD-10-CM | POA: Diagnosis not present

## 2020-10-14 DIAGNOSIS — H401132 Primary open-angle glaucoma, bilateral, moderate stage: Secondary | ICD-10-CM | POA: Diagnosis not present

## 2020-10-15 ENCOUNTER — Ambulatory Visit: Payer: Self-pay | Admitting: Neurology

## 2020-10-16 DIAGNOSIS — R82998 Other abnormal findings in urine: Secondary | ICD-10-CM | POA: Diagnosis not present

## 2020-10-28 DIAGNOSIS — E1151 Type 2 diabetes mellitus with diabetic peripheral angiopathy without gangrene: Secondary | ICD-10-CM | POA: Diagnosis not present

## 2020-10-28 DIAGNOSIS — E785 Hyperlipidemia, unspecified: Secondary | ICD-10-CM | POA: Diagnosis not present

## 2020-10-29 DIAGNOSIS — R2681 Unsteadiness on feet: Secondary | ICD-10-CM | POA: Diagnosis not present

## 2020-10-29 DIAGNOSIS — Z7901 Long term (current) use of anticoagulants: Secondary | ICD-10-CM | POA: Diagnosis not present

## 2020-10-29 DIAGNOSIS — N3281 Overactive bladder: Secondary | ICD-10-CM | POA: Diagnosis not present

## 2020-10-29 DIAGNOSIS — I10 Essential (primary) hypertension: Secondary | ICD-10-CM | POA: Diagnosis not present

## 2020-10-29 DIAGNOSIS — Z1331 Encounter for screening for depression: Secondary | ICD-10-CM | POA: Diagnosis not present

## 2020-10-29 DIAGNOSIS — J449 Chronic obstructive pulmonary disease, unspecified: Secondary | ICD-10-CM | POA: Diagnosis not present

## 2020-10-29 DIAGNOSIS — Z0000 Encounter for general adult medical examination without abnormal findings: Secondary | ICD-10-CM | POA: Diagnosis not present

## 2020-10-29 DIAGNOSIS — R110 Nausea: Secondary | ICD-10-CM | POA: Diagnosis not present

## 2020-10-29 DIAGNOSIS — I4821 Permanent atrial fibrillation: Secondary | ICD-10-CM | POA: Diagnosis not present

## 2020-10-29 DIAGNOSIS — Z1339 Encounter for screening examination for other mental health and behavioral disorders: Secondary | ICD-10-CM | POA: Diagnosis not present

## 2020-10-29 DIAGNOSIS — R82998 Other abnormal findings in urine: Secondary | ICD-10-CM | POA: Diagnosis not present

## 2020-10-29 DIAGNOSIS — H353132 Nonexudative age-related macular degeneration, bilateral, intermediate dry stage: Secondary | ICD-10-CM | POA: Diagnosis not present

## 2020-10-30 ENCOUNTER — Other Ambulatory Visit: Payer: Self-pay

## 2020-10-30 ENCOUNTER — Ambulatory Visit: Payer: Medicare HMO | Admitting: Podiatry

## 2020-10-30 ENCOUNTER — Encounter: Payer: Self-pay | Admitting: Podiatry

## 2020-10-30 DIAGNOSIS — E1140 Type 2 diabetes mellitus with diabetic neuropathy, unspecified: Secondary | ICD-10-CM | POA: Diagnosis not present

## 2020-10-30 DIAGNOSIS — B351 Tinea unguium: Secondary | ICD-10-CM | POA: Diagnosis not present

## 2020-10-30 NOTE — Progress Notes (Signed)
This patient returns to my office for at risk foot care.  This patient requires this care by a professional since this patient will be at risk due to having diabetes with neuropathy and coagulation defect.  This patient is unable to cut nails herself since the patient cannot reach her nails.These nails are painful walking and wearing shoes.  This patient presents for at risk foot care today.  General Appearance  Alert, conversant and in no acute stress.  Vascular  Dorsalis pedis and posterior tibial  pulses are weakly  palpable  bilaterally.  Capillary return is within normal limits  bilaterally. Cold feet bilaterally. Absent digital hair  B/L.  Neurologic  Senn-Weinstein monofilament wire test diminished  limits  bilaterally. Muscle power within normal limits bilaterally.  Nails Thick disfigured discolored nails with subungual debris  from hallux to fifth toes bilaterally. No evidence of bacterial infection or drainage bilaterally.  Orthopedic  No limitations of motion  feet .  No crepitus or effusions noted.  No bony pathology or digital deformities noted.  Skin  normotropic skin with no porokeratosis noted bilaterally.  No signs of infections or ulcers noted.     Onychomycosis  Pain in right toes  Pain in left toes  Consent was obtained for treatment procedures.   Mechanical debridement of nails 1-5  bilaterally performed with a nail nipper.  Filed with dremel without incident.    Return office visit  3 months.                    Told patient to return for periodic foot care and evaluation due to potential at risk complications.   Gardiner Barefoot DPM

## 2020-11-01 ENCOUNTER — Ambulatory Visit: Payer: Medicare HMO | Admitting: Podiatry

## 2020-11-13 DIAGNOSIS — E1151 Type 2 diabetes mellitus with diabetic peripheral angiopathy without gangrene: Secondary | ICD-10-CM | POA: Diagnosis not present

## 2020-11-18 DIAGNOSIS — E1151 Type 2 diabetes mellitus with diabetic peripheral angiopathy without gangrene: Secondary | ICD-10-CM | POA: Diagnosis not present

## 2021-01-07 DIAGNOSIS — R300 Dysuria: Secondary | ICD-10-CM | POA: Diagnosis not present

## 2021-01-13 DIAGNOSIS — H401132 Primary open-angle glaucoma, bilateral, moderate stage: Secondary | ICD-10-CM | POA: Diagnosis not present

## 2021-01-15 DIAGNOSIS — E782 Mixed hyperlipidemia: Secondary | ICD-10-CM | POA: Diagnosis not present

## 2021-01-15 DIAGNOSIS — I10 Essential (primary) hypertension: Secondary | ICD-10-CM | POA: Diagnosis not present

## 2021-01-15 DIAGNOSIS — Z794 Long term (current) use of insulin: Secondary | ICD-10-CM | POA: Diagnosis not present

## 2021-01-15 DIAGNOSIS — E1142 Type 2 diabetes mellitus with diabetic polyneuropathy: Secondary | ICD-10-CM | POA: Diagnosis not present

## 2021-01-29 ENCOUNTER — Encounter: Payer: Self-pay | Admitting: Podiatry

## 2021-01-29 ENCOUNTER — Ambulatory Visit: Payer: Medicare HMO | Admitting: Podiatry

## 2021-01-29 ENCOUNTER — Other Ambulatory Visit: Payer: Self-pay

## 2021-01-29 DIAGNOSIS — M79674 Pain in right toe(s): Secondary | ICD-10-CM

## 2021-01-29 DIAGNOSIS — B351 Tinea unguium: Secondary | ICD-10-CM | POA: Diagnosis not present

## 2021-01-29 DIAGNOSIS — M79675 Pain in left toe(s): Secondary | ICD-10-CM

## 2021-01-29 HISTORY — DX: Pain in right toe(s): M79.674

## 2021-01-29 HISTORY — DX: Tinea unguium: B35.1

## 2021-01-29 NOTE — Progress Notes (Signed)
This patient returns to my office for at risk foot care.  This patient requires this care by a professional since this patient will be at risk due to having diabetes with neuropathy and coagulation defect.  This patient is unable to cut nails herself since the patient cannot reach her nails.These nails are painful walking and wearing shoes.  This patient presents for at risk foot care today.  General Appearance  Alert, conversant and in no acute stress.  Vascular  Dorsalis pedis and posterior tibial  pulses are weakly  palpable  bilaterally.  Capillary return is within normal limits  bilaterally. Cold feet bilaterally. Absent digital hair  B/L.  Neurologic  Senn-Weinstein monofilament wire test diminished  limits  bilaterally. Muscle power within normal limits bilaterally.  Nails Thick disfigured discolored nails with subungual debris  from hallux to fifth toes bilaterally. No evidence of bacterial infection or drainage bilaterally.  Orthopedic  No limitations of motion  feet .  No crepitus or effusions noted.  No bony pathology or digital deformities noted.  Skin  normotropic skin with no porokeratosis noted bilaterally.  No signs of infections or ulcers noted.     Onychomycosis  Pain in right toes  Pain in left toes  Consent was obtained for treatment procedures.   Mechanical debridement of nails 1-5  bilaterally performed with a nail nipper.  Filed with dremel without incident.    Return office visit  3 months.                    Told patient to return for periodic foot care and evaluation due to potential at risk complications.   Gardiner Barefoot DPM

## 2021-02-15 DIAGNOSIS — E1151 Type 2 diabetes mellitus with diabetic peripheral angiopathy without gangrene: Secondary | ICD-10-CM | POA: Diagnosis not present

## 2021-02-17 DIAGNOSIS — H401132 Primary open-angle glaucoma, bilateral, moderate stage: Secondary | ICD-10-CM | POA: Diagnosis not present

## 2021-03-11 DIAGNOSIS — I4821 Permanent atrial fibrillation: Secondary | ICD-10-CM | POA: Diagnosis not present

## 2021-03-11 DIAGNOSIS — Z7901 Long term (current) use of anticoagulants: Secondary | ICD-10-CM | POA: Diagnosis not present

## 2021-03-11 DIAGNOSIS — B351 Tinea unguium: Secondary | ICD-10-CM | POA: Diagnosis not present

## 2021-03-13 DIAGNOSIS — L609 Nail disorder, unspecified: Secondary | ICD-10-CM | POA: Diagnosis not present

## 2021-03-13 DIAGNOSIS — B351 Tinea unguium: Secondary | ICD-10-CM | POA: Diagnosis not present

## 2021-03-13 DIAGNOSIS — L03019 Cellulitis of unspecified finger: Secondary | ICD-10-CM | POA: Diagnosis not present

## 2021-03-13 DIAGNOSIS — L603 Nail dystrophy: Secondary | ICD-10-CM | POA: Diagnosis not present

## 2021-03-17 DIAGNOSIS — L989 Disorder of the skin and subcutaneous tissue, unspecified: Secondary | ICD-10-CM | POA: Diagnosis not present

## 2021-03-28 DIAGNOSIS — E1151 Type 2 diabetes mellitus with diabetic peripheral angiopathy without gangrene: Secondary | ICD-10-CM | POA: Diagnosis not present

## 2021-04-03 DIAGNOSIS — H34832 Tributary (branch) retinal vein occlusion, left eye, with macular edema: Secondary | ICD-10-CM | POA: Diagnosis not present

## 2021-04-03 DIAGNOSIS — Z961 Presence of intraocular lens: Secondary | ICD-10-CM | POA: Diagnosis not present

## 2021-04-03 DIAGNOSIS — H401132 Primary open-angle glaucoma, bilateral, moderate stage: Secondary | ICD-10-CM | POA: Diagnosis not present

## 2021-04-03 DIAGNOSIS — H353132 Nonexudative age-related macular degeneration, bilateral, intermediate dry stage: Secondary | ICD-10-CM | POA: Diagnosis not present

## 2021-04-03 DIAGNOSIS — H35363 Drusen (degenerative) of macula, bilateral: Secondary | ICD-10-CM | POA: Diagnosis not present

## 2021-04-03 DIAGNOSIS — E119 Type 2 diabetes mellitus without complications: Secondary | ICD-10-CM | POA: Diagnosis not present

## 2021-04-09 DIAGNOSIS — L03019 Cellulitis of unspecified finger: Secondary | ICD-10-CM | POA: Diagnosis not present

## 2021-04-09 DIAGNOSIS — Z7901 Long term (current) use of anticoagulants: Secondary | ICD-10-CM | POA: Diagnosis not present

## 2021-04-09 DIAGNOSIS — I4821 Permanent atrial fibrillation: Secondary | ICD-10-CM | POA: Diagnosis not present

## 2021-04-30 ENCOUNTER — Ambulatory Visit: Payer: Medicare HMO | Admitting: Podiatry

## 2021-05-01 ENCOUNTER — Emergency Department (HOSPITAL_COMMUNITY): Payer: Medicare HMO

## 2021-05-01 ENCOUNTER — Other Ambulatory Visit: Payer: Self-pay

## 2021-05-01 ENCOUNTER — Emergency Department (HOSPITAL_COMMUNITY)
Admission: EM | Admit: 2021-05-01 | Discharge: 2021-05-01 | Disposition: A | Discharge status: Home / Self Care | Payer: Medicare HMO | Attending: Emergency Medicine | Admitting: Emergency Medicine

## 2021-05-01 DIAGNOSIS — R0902 Hypoxemia: Secondary | ICD-10-CM | POA: Diagnosis not present

## 2021-05-01 DIAGNOSIS — N390 Urinary tract infection, site not specified: Secondary | ICD-10-CM | POA: Diagnosis not present

## 2021-05-01 DIAGNOSIS — J449 Chronic obstructive pulmonary disease, unspecified: Secondary | ICD-10-CM | POA: Diagnosis not present

## 2021-05-01 DIAGNOSIS — N281 Cyst of kidney, acquired: Secondary | ICD-10-CM | POA: Diagnosis not present

## 2021-05-01 DIAGNOSIS — B9689 Other specified bacterial agents as the cause of diseases classified elsewhere: Secondary | ICD-10-CM | POA: Diagnosis not present

## 2021-05-01 DIAGNOSIS — Z7984 Long term (current) use of oral hypoglycemic drugs: Secondary | ICD-10-CM | POA: Diagnosis not present

## 2021-05-01 DIAGNOSIS — Z79899 Other long term (current) drug therapy: Secondary | ICD-10-CM | POA: Insufficient documentation

## 2021-05-01 DIAGNOSIS — Z794 Long term (current) use of insulin: Secondary | ICD-10-CM | POA: Insufficient documentation

## 2021-05-01 DIAGNOSIS — W19XXXA Unspecified fall, initial encounter: Secondary | ICD-10-CM | POA: Insufficient documentation

## 2021-05-01 DIAGNOSIS — S22080A Wedge compression fracture of T11-T12 vertebra, initial encounter for closed fracture: Secondary | ICD-10-CM | POA: Diagnosis not present

## 2021-05-01 DIAGNOSIS — Z8616 Personal history of COVID-19: Secondary | ICD-10-CM | POA: Diagnosis not present

## 2021-05-01 DIAGNOSIS — A499 Bacterial infection, unspecified: Secondary | ICD-10-CM

## 2021-05-01 DIAGNOSIS — I10 Essential (primary) hypertension: Secondary | ICD-10-CM | POA: Insufficient documentation

## 2021-05-01 DIAGNOSIS — S3992XA Unspecified injury of lower back, initial encounter: Secondary | ICD-10-CM | POA: Diagnosis present

## 2021-05-01 DIAGNOSIS — E1140 Type 2 diabetes mellitus with diabetic neuropathy, unspecified: Secondary | ICD-10-CM | POA: Diagnosis not present

## 2021-05-01 DIAGNOSIS — R5381 Other malaise: Secondary | ICD-10-CM | POA: Diagnosis not present

## 2021-05-01 DIAGNOSIS — K5790 Diverticulosis of intestine, part unspecified, without perforation or abscess without bleeding: Secondary | ICD-10-CM | POA: Diagnosis not present

## 2021-05-01 DIAGNOSIS — M545 Low back pain, unspecified: Secondary | ICD-10-CM

## 2021-05-01 LAB — CBC WITH DIFFERENTIAL/PLATELET
Abs Immature Granulocytes: 0.12 10*3/uL — ABNORMAL HIGH (ref 0.00–0.07)
Basophils Absolute: 0 10*3/uL (ref 0.0–0.1)
Basophils Relative: 0 %
Eosinophils Absolute: 0.1 10*3/uL (ref 0.0–0.5)
Eosinophils Relative: 1 %
HCT: 40 % (ref 36.0–46.0)
Hemoglobin: 13.2 g/dL (ref 12.0–15.0)
Immature Granulocytes: 1 %
Lymphocytes Relative: 16 %
Lymphs Abs: 2.1 10*3/uL (ref 0.7–4.0)
MCH: 30.4 pg (ref 26.0–34.0)
MCHC: 33 g/dL (ref 30.0–36.0)
MCV: 92.2 fL (ref 80.0–100.0)
Monocytes Absolute: 0.6 10*3/uL (ref 0.1–1.0)
Monocytes Relative: 4 %
Neutro Abs: 9.9 10*3/uL — ABNORMAL HIGH (ref 1.7–7.7)
Neutrophils Relative %: 78 %
Platelets: 172 10*3/uL (ref 150–400)
RBC: 4.34 MIL/uL (ref 3.87–5.11)
RDW: 15.4 % (ref 11.5–15.5)
WBC: 12.8 10*3/uL — ABNORMAL HIGH (ref 4.0–10.5)
nRBC: 0 % (ref 0.0–0.2)

## 2021-05-01 LAB — URINALYSIS, ROUTINE W REFLEX MICROSCOPIC
Bilirubin Urine: NEGATIVE
Glucose, UA: NEGATIVE mg/dL
Ketones, ur: NEGATIVE mg/dL
Nitrite: NEGATIVE
Protein, ur: 30 mg/dL — AB
Specific Gravity, Urine: 1.01 (ref 1.005–1.030)
pH: 8.5 — ABNORMAL HIGH (ref 5.0–8.0)

## 2021-05-01 LAB — BASIC METABOLIC PANEL
Anion gap: 12 (ref 5–15)
BUN: 15 mg/dL (ref 8–23)
CO2: 27 mmol/L (ref 22–32)
Calcium: 9.3 mg/dL (ref 8.9–10.3)
Chloride: 99 mmol/L (ref 98–111)
Creatinine, Ser: 0.91 mg/dL (ref 0.44–1.00)
GFR, Estimated: >60 mL/min (ref >60)
Glucose, Bld: 163 mg/dL — ABNORMAL HIGH (ref 70–99)
Potassium: 3.5 mmol/L (ref 3.5–5.1)
Sodium: 138 mmol/L (ref 135–145)

## 2021-05-01 LAB — URINALYSIS, MICROSCOPIC (REFLEX)

## 2021-05-01 MED ORDER — HYDROCODONE-ACETAMINOPHEN 5-325 MG PO TABS
1.0000 | ORAL_TABLET | Freq: Once | ORAL | Status: AC
  Administered 2021-05-01: 1 via ORAL
  Filled 2021-05-01: qty 1

## 2021-05-01 MED ORDER — ONDANSETRON HCL 4 MG/2ML IJ SOLN
4.0000 mg | Freq: Once | INTRAMUSCULAR | Status: AC
  Administered 2021-05-01: 4 mg via INTRAVENOUS
  Filled 2021-05-01: qty 2

## 2021-05-01 MED ORDER — OXYCODONE HCL 5 MG PO TABS
5.0000 mg | ORAL_TABLET | Freq: Once | ORAL | Status: AC
  Administered 2021-05-01: 5 mg via ORAL
  Filled 2021-05-01: qty 1

## 2021-05-01 MED ORDER — NITROFURANTOIN MONOHYD MACRO 100 MG PO CAPS: 100.0000 mg | ORAL_CAPSULE | Freq: Two times a day (BID) | ORAL | 0 refills | Status: DC

## 2021-05-01 MED ORDER — HYDROCODONE-ACETAMINOPHEN 5-325 MG PO TABS: 1.0000 | ORAL_TABLET | Freq: Three times a day (TID) | ORAL | 0 refills | Status: AC | PRN

## 2021-05-01 NOTE — ED Provider Notes (Signed)
[facility] EMERGENCY DEPARTMENT Provider Note   CSN: [id number] Arrival date & time: 05/01/21  1026     History Chief Complaint  Patient presents with   Back Pain    Pt arrived via PTAR for back pain. Per EMS pt has had back pain since sunday and since has fallen the last 2 days. Pt states it is in lower back that radiated to front, and describes as sharp pain denies pain getting worse.  190/90, 107HR, 94% RA     [name] [name] is a 87 y.o. female with rate controlled afib, arthritis and a left hip fracture presenting with the complaint of lower back pain starting this weekend. She describes the pain as "sharp like a knife" on both sides, and says that it radiates around to the front in a belt like distribution. 10/10 in severity. She denies any falls or injuries. No numbness or urinary/bowel incontinence but reports mild dysuria. The pain comes and goes, seemingly brought on by movement. Currently has difficulty rising from a seated position. Pain is alleviated by "tying a tight cloth around her back" before moving. Ambulates with a walker at baseline. No history of kidney dz/stones, back procedures or malignancy.        Past Medical History:  Diagnosis Date   Atrial fibrillation ([place])    RVR hx.  Chronic Coumadin   Bacteremia 10/2018   C. difficile colitis 09/21/2013, 11/2013   Carotid stenosis    a. Carotid US (10/2013):  bilat 1-39%; f/u 1 year   Chronic lower back pain    COPD (chronic obstructive pulmonary disease) (HCC)    Depression    Diverticulosis of colon without hemorrhage    Gait abnormality 04/11/2020   GERD (gastroesophageal reflux disease)    with HH   Glaucoma    HCAP (healthcare-associated pneumonia) 11/21/2011   High cholesterol    Hypertension    Insomnia    Memory disorder [id number]   Metabolic encephalopathy 0000000   Mitral regurgitation 03/11/2012   Pancreatitis    ~2008, 11/2011 post ERCP   Pneumonia 12/2011   "first time I  know about"   PVD (peripheral vascular disease) ([place])    Sepsis ([place]) 11/27/2013   SIRS (systemic inflammatory response syndrome) ([place]) 03/13/2012   a/w post ERCP  pancreatitis.    Syncope    Type II diabetes mellitus ([place])    Unsteady gait     Patient Active Problem List   Diagnosis Date Noted   Pain due to onychomycosis of toenails of both feet 01/29/2021   Gait abnormality 04/11/2020   Memory disorder 04/11/2020   Disorder of tooth development 01/30/2020   Rupture of right rotator cuff 01/30/2020   Primary open angle glaucoma (POAG) of both eyes, moderate stage 11/09/2019   Chronic atrial fibrillation ([place]) 09/11/2019   Dizziness 09/11/2019   Educated about COVID-19 virus infection 09/11/2019   Localized edema 02/21/2019   Pincer nail deformity 12/26/2018   Cellulitis of finger of right hand 12/21/2018   Branch retinal vein occlusion of left eye with macular edema 12/15/2018   Intermediate stage nonexudative age-related macular degeneration of both eyes 12/15/2018   Pseudophakia of both eyes 12/15/2018   Influenza A 11/28/2018   Acute encephalopathy 11/26/2018   Bacteremia 10/26/2018   SIRS (systemic inflammatory response syndrome) ([place]) 10/25/2018   Dysphagia 10/11/2018   Pain in right foot 10/11/2018   Restless legs syndrome 07/01/2018   Hemorrhoids    Shock ([place])    Candidiasis of skin  and nails 02/21/2018   DM neuropathy, type II diabetes mellitus ([place]) 01/07/2018   Hip fracture ([place]) 01/06/2018   Advance care planning    Goals of care, counseling/discussion    COPD (chronic obstructive pulmonary disease) ([place]) 10/28/2017   Overactive bladder 07/29/2016   RLQ abdominal pain 04/24/2014   Nausea alone 04/11/2014   Major depressive disorder, recurrent episode ([place]) 01/16/2014   Generalized anxiety disorder 10/24/2013   Type 2 diabetes mellitus without retinopathy ([place]) 03/11/2013   Insomnia 11/28/2012   Abdominal pain 08/05/2012    Class: Acute   Gastro-esophageal  reflux disease without esophagitis 05/20/2012   Low back pain 05/20/2012   Noninfective gastroenteritis and colitis, unspecified 05/20/2012   Irritable bowel syndrome 04/12/2012   Chronic diarrhea 04/12/2012   Encounter for long-term (current) use of anticoagulants 03/17/2012   Depression 03/13/2012   Anxiety 03/13/2012   Mitral regurgitation 03/11/2012   Chronic epigastric pain 01/25/2012   Coagulopathy ([place]) 12/12/2011   Symptomatic anemia 12/12/2011   Atrial fibrillation     UTI (urinary tract infection) 11/13/2011   DM II (diabetes mellitus, type II), controlled ([place]) 11/13/2011   HTN (hypertension) 11/13/2011   Hypercholesteremia 11/13/2011   Glaucoma (increased eye pressure) 11/13/2011    Past Surgical History:  Procedure Laterality Date   APPENDECTOMY  ~ 1960   CATARACT EXTRACTION W/ INTRAOCULAR LENS  IMPLANT, BILATERAL  2000's   CHOLECYSTECTOMY  1990's   Lap chole   COLONOSCOPY  01/2003   diverticulosis, 37m hyperplastic sigmoid polyp   COLONOSCOPY WITH PROPOFOL N/A 06/02/2018   Procedure: COLONOSCOPY WITH PROPOFOL;  Surgeon: CLavena Bullion DO;  Location: [facility]  Service: Gastroenterology;  Laterality: N/A;   DILATION AND CURETTAGE OF UTERUS     ERCP  11/13/2011   Procedure: ENDOSCOPIC RETROGRADE CHOLANGIOPANCREATOGRAPHY (ERCP);  Surgeon: PBeryle Beams MD;  [facility]  Service: Endoscopy;  Laterality: N/A;   FLEXIBLE SIGMOIDOSCOPY N/A 05/02/2014   Procedure: FLEXIBLE SIGMOIDOSCOPY;  Surgeon: CGatha Mayer MD;  Location: [facility];  Service: Endoscopy;  Laterality: N/A;   HIP ARTHROPLASTY Left 01/07/2018   Procedure: ARTHROPLASTY BIPOLAR HIP (HEMIARTHROPLASTY);  Surgeon: VHiram Gash MD;  [facility]  Service: Orthopedics;  Laterality: Left;   TONSILLECTOMY AND ADENOIDECTOMY  ~ 1[place] ~ 1960's     OB History   No obstetric history on file.     Family History  Problem Relation Age of Onset   Stroke Father 942   Stroke Mother 874  Diabetes Mellitus II Mother    Heart disease Mother    Hypertension Mother    Diabetes Mellitus II Sister    Diabetes Mellitus II Brother    Anesthesia problems Neg Hx    Hypotension Neg Hx    Malignant hyperthermia Neg Hx    Pseudochol deficiency Neg Hx    Stomach cancer Neg Hx    Esophageal cancer Neg Hx     Social History   Tobacco Use   Smoking status: Never   Smokeless tobacco: Never  Vaping Use   Vaping Use: Never used  Substance Use Topics   Alcohol use: No   Drug use: No    Home Medications Prior to Admission medications   Medication Sig Start Date End Date Taking? Authorizing Provider  acetaminophen (TYLENOL) 325 MG tablet Take 650 mg by mouth every 6 (six) hours as needed for mild pain.    Yes [provider]  albuterol (PROVENTIL) (2.5 MG/3ML) 0.083% nebulizer  solution take 1 treatment up to 3 times daily as needed for difficulty breathing 07/01/18  Yes [provider]  diltiazem (DILT-XR) 120 MG 24 hr capsule TAKE 1 CAPSULE(120 MG) BY MOUTH DAILY Patient taking differently: Take 120 mg by mouth daily. 01/04/18  Yes Minus Breeding, MD  doxepin (SINEQUAN) 10 MG capsule  11/02/19  Yes [provider]  furosemide (LASIX) 40 MG tablet Take 40 mg by mouth. Take one tablet (40 mg) by mouth once a day   Yes [provider]  gabapentin (NEURONTIN) 100 MG capsule  04/08/20  Yes [provider]  glimepiride (AMARYL) 4 MG tablet  11/01/19  Yes [provider]  hydrochlorothiazide (MICROZIDE) 12.5 MG capsule TAKE 1 CAPSULE BY MOUTH EVERY OTHER DAY AS NEEDED FOR BILATERAL LOWER EXTREMITY EDEMA 02/21/19  Yes [provider]  Insulin Glargine, 1 Unit Dial, 300 UNIT/ML SOPN Inject into the skin daily.  10/12/19  Yes [provider]  loperamide (LOPERAMIDE A-D) 2 MG tablet Take 1 tablet (2 mg total) by mouth as needed for diarrhea or loose stools (use at bedtime). Patient taking differently: Take  1-2 mg by mouth at bedtime as needed for diarrhea or loose stools. 11/27/14  Yes Gatha Mayer, MD  LORazepam (ATIVAN) 1 MG tablet Take 1 tablet by mouth daily. 08/27/14  Yes [provider]  metoprolol tartrate (LOPRESSOR) 25 MG tablet Take 12.5 mg by mouth 2 (two) times daily.   Yes [provider]  Multiple Vitamin (MULTIVITAMIN WITH MINERALS) TABS Take 1 tablet by mouth daily.   Yes [provider]  ondansetron (ZOFRAN) 4 MG tablet  05/06/20  Yes [provider]  oxybutynin (DITROPAN) 5 MG tablet Take 5 mg by mouth at bedtime.  07/29/16  Yes [provider]  pantoprazole (PROTONIX) 40 MG tablet Take 1 tablet (40 mg total) by mouth 2 (two) times daily. 06/07/18 11/27/27 Yes Arrien, Jimmy Picket, MD  pilocarpine [facility]) 1 % ophthalmic solution Place 1 drop into both eyes in the morning and at bedtime. 08/28/20  Yes [provider]  potassium chloride SA (KLOR-CON) 20 MEQ tablet  05/04/20  Yes [provider]  Probiotic Product (PROBIOTIC PO) Take 1 capsule by mouth daily.   Yes [provider]  promethazine (PHENERGAN) 25 MG tablet Take 25 mg by mouth 3 (three) times daily as needed for nausea or vomiting.   Yes [provider]  rosuvastatin (CRESTOR) 10 MG tablet daily.  11/01/19  Yes [provider]  warfarin (COUMADIN) 2.5 MG tablet  04/08/20  Yes [provider]  azithromycin (ZITHROMAX) 250 MG tablet  06/22/20   [provider]  BD PEN NEEDLE NANO U/F 32G X 4 MM MISC  05/13/20   [provider]  cephALEXin (KEFLEX) 250 MG capsule  06/27/20   [provider]  clotrimazole (LOTRIMIN) 1 % cream apply to groin bid x 2 wks, may repeat x 2 wks Patient not taking: No sig reported 02/21/18   [provider]  dexamethasone (DECADRON) 6 MG tablet  06/22/20   [provider]  diltiazem (CARDIZEM CD) 120 MG 24 hr capsule  11/01/19   [provider]  doxycycline  (VIBRA-TABS) 100 MG tablet  06/26/20   [provider]  gabapentin (NEURONTIN) 300 MG capsule  11/01/19   [provider]  guaiFENesin (MUCINEX) 600 MG 12 hr tablet Take 600 mg by mouth 2 (two) times daily. Patient not taking: No sig reported    [provider]  guaiFENesin (ROBITUSSIN) 100 MG/5ML SOLN Take 5 mLs by mouth every 6 (six) hours as needed for cough or to loosen phlegm. Patient not taking: No sig reported    [provider]  Lancets (ONETOUCH DELICA PLUS Q000111Q) [place] 1 each by [place].(Non-Drug; Combo Route) route in the morning and at bedtime. DX:  E11.9 07/09/20 07/09/21  [provider]  latanoprost (XALATAN) 0.005 % ophthalmic solution Place 1 drop into both eyes at bedtime. Patient not taking: No sig reported    [provider]  Melatonin 5 MG TABS Take 5 mg by mouth at bedtime. Patient not taking: No sig reported    [provider]  mirtazapine (REMERON) 7.5 MG tablet TAKE 1 TABLET(7.5 MG) BY MOUTH AT BEDTIME Patient not taking: No sig reported 10/06/16   Gatha Mayer, MD  nystatin (MYCOSTATIN) 100000 UNIT/ML suspension  07/18/20   [provider]  oseltamivir (TAMIFLU) 30 MG capsule Take 1 capsule (30 mg total) by mouth 2 (two) times daily. Patient not taking: No sig reported 11/28/18   Debbe Odea, MD  PFIZER-BIONTECH COVID-19 VACC 30 MCG/0.3ML injection  11/05/20   [provider]  rOPINIRole (REQUIP) 0.5 MG tablet Take 0.5 mg by mouth See admin instructions. Take 1 tablet at 4 pm and at bedtime Patient not taking: No sig reported 10/19/18   [provider]  sucralfate (CARAFATE) 1 g tablet Take one tablet BID Patient not taking: No sig reported 06/28/18   [provider]  SYMBICORT 160-4.5 MCG/ACT inhaler Inhale 2 puffs into the lungs 2 (two) times daily.  Patient not taking: No sig reported 03/08/14   [provider]  timolol (TIMOPTIC) 0.5 % ophthalmic solution  09/21/19    [provider]  warfarin (COUMADIN) 2 MG tablet Take 2 mg by mouth daily. Patient not taking: No sig reported    [provider]  zolpidem (AMBIEN) 10 MG tablet Take 1 tablet by mouth at bedtime as needed. Patient not taking: No sig reported 07/27/17   [provider]    Allergies    Lactose intolerance (gi), Penicillins, Sulfa antibiotics, Ciprofloxacin, Lisinopril, and Sulfasalazine  Review of Systems   Review of Systems  Constitutional:  Positive for fatigue. Negative for fever.  Respiratory:  Negative for chest tightness and shortness of breath.   Cardiovascular:  Negative for chest pain and leg swelling.  Gastrointestinal:  Negative for abdominal pain, diarrhea, nausea and vomiting.  Musculoskeletal:  Positive for back pain. Negative for myalgias.  Neurological:  Positive for dizziness (sometimes when trying to stand up). Negative for syncope, weakness and numbness.   Denies weakness, loss of bowel/bladder function or saddle anesthesia. Denies neck stiffness, headache, rash.  Denies fever or recent procedures to back.   Physical Exam Updated Vital Signs BP 123/64   Pulse 89   Temp 98.6 F (37 C) (Oral)   Resp 16   Ht 5' (1.524 m)   Wt 59 kg   SpO2 93%   BMI 25.39 kg/m   Physical Exam HENT:     Head: Normocephalic and atraumatic.  Eyes:     Extraocular Movements: Extraocular movements intact.     Pupils: Pupils are equal, round, and reactive to light.  Cardiovascular:     Rate and Rhythm: Normal rate. Rhythm irregular.  Pulmonary:     Effort: Pulmonary effort is normal. No respiratory distress.  Abdominal:     General: Abdomen is flat. Bowel sounds are normal. There is no distension.  Palpations: Abdomen is soft.     Tenderness: There is no abdominal tenderness. There is no right CVA tenderness, left CVA tenderness or guarding.  Musculoskeletal:        General: Tenderness (bilat lower back. no midline tenderness) present. No  swelling or deformity.     Cervical back: Normal range of motion.  Skin:    General: Skin is warm and dry.     Findings: No bruising, erythema or lesion.  Neurological:     General: No focal deficit present.     Mental Status: She is alert and oriented to person, place, and time.     Cranial Nerves: No cranial nerve deficit.     Sensory: No sensory deficit.     Motor: Weakness (4/5 strength with active left hip flextion) present.  Psychiatric:        Behavior: Behavior normal.        Thought Content: Thought content normal.     ED Results / Procedures / Treatments   Labs (all labs ordered are listed, but only abnormal results are displayed) Labs Reviewed  URINALYSIS, ROUTINE W REFLEX MICROSCOPIC - Abnormal; Notable for the following components:      Result Value   APPearance HAZY (*)    pH 8.5 (*)    Hgb urine dipstick SMALL (*)    Protein, ur 30 (*)    Leukocytes,Ua LARGE (*)    All other components within normal limits  BASIC METABOLIC PANEL - Abnormal; Notable for the following components:   Glucose, Bld 163 (*)    All other components within normal limits  CBC WITH DIFFERENTIAL/PLATELET - Abnormal; Notable for the following components:   WBC 12.8 (*)    Neutro Abs 9.9 (*)    Abs Immature Granulocytes 0.12 (*)    All other components within normal limits  URINALYSIS, MICROSCOPIC (REFLEX) - Abnormal; Notable for the following components:   Bacteria, UA FEW (*)    All other components within normal limits    Radiology CT Abdomen Pelvis Wo Contrast  Result Date: 05/01/2021 CLINICAL DATA:  Abdominal trauma Low back pain, increased fracture risk elderly fall with low back and abdominal pain. EXAM: CT ABDOMEN AND PELVIS WITHOUT CONTRAST TECHNIQUE: Multidetector CT imaging of the abdomen and pelvis was performed following the standard protocol without IV contrast. COMPARISON:  October 28, 2017 FINDINGS: Evaluation is limited secondary to lack of IV contrast. Lower chest:  Bibasilar atelectasis. Three-vessel coronary artery atherosclerotic calcifications. Hepatobiliary: Status post cholecystectomy. Unchanged punctate calcification of the LEFT liver likely reflecting sequela of remote prior granulomatous infection. Otherwise unremarkable noncontrast appearance of the liver. Pancreas: No peripancreatic fat stranding. Spleen: Unremarkable. Adrenals/Urinary Tract: Adrenal glands are unremarkable. RIGHT renal cyst. No hydronephrosis. No obstructing nephrolithiasis visualized although evaluation of the UVJ and bladder is limited secondary to streak artifact from LEFT hip arthroplasty. Stomach/Bowel: No evidence of bowel obstruction. Diverticulosis without evidence of acute diverticulitis. Appendix is not visualized. Vascular/Lymphatic: Atherosclerotic calcifications of the aorta. No suspicious lymphadenopathy. Reproductive: Status post hysterectomy. No adnexal masses. Other: No free air or free fluid. Musculoskeletal: Status post LEFT hip arthroplasty. There is a moderate compression fracture of T12, new since 2019. Unchanged grade 1 anterolisthesis of L4-5 with associated severe degenerative changes. IMPRESSION: 1. There is a moderate compression fracture of T12, new since 2019. This is age indeterminate. Recommend correlation with point tenderness. 2. No evidence of acute traumatic visceral injury. Aortic Atherosclerosis (ICD10-I70.0). Electronically Signed   By: Valentino Saxon MD   On:  05/01/2021 13:09     Medications Ordered in ED Medications  oxyCODONE (Oxy IR/ROXICODONE) immediate release tablet 5 mg (5 mg Oral Given 05/01/21 1335)  ondansetron (ZOFRAN) injection 4 mg (4 mg Intravenous Given 05/01/21 1337)    ED Course  I have reviewed the triage vital signs and the nursing notes and discussed the case with my supervising physician.  Pertinent labs & imaging results that were available during my care of the patient were reviewed by me and considered in my medical decision  making (see chart for details).  -CT scan reveals age indeterminate T12 compression fracture. NS paged at 3:35p  -Patient received '5mg'$  of PO oxy for pain. Reports major improvement.  -Bloodwork shows elevated wbc count and UA suggests UTI.  -[place] [facility] spoke with Dr. Reather Converse and are comfortable with her going home as long as she can ambulate. Ortho to bring TLSO prior to discharge.  -Handoff given to Dr. Dina Rich who will complete care.    MDM Rules/Calculators/A&P                         The emergent differential diagnosis for back pain includes but is not limited to fracture, muscle strain, cauda equina, spinal stenosis. DDD, ankylosing spondylitis, acute ligamentous injury, disk herniation, spondylolisthesis, Epidural compression syndrome, metastatic cancer, transverse myelitis, vertebral osteomyelitis, diskitis, kidney stone, pyelonephritis, AAA, Perforated ulcer, Retrocecal appendicitis, pancreatitis, bowel obstruction, retroperitoneal hemorrhage or mass, meningitis.   All of the above conditions were considered throughout the patient's evaluation. After labs and diagnostics, patient revealed to have T12 compression fracture and UTI.  Patient and husband are agreeable to discharge. Expressed understanding of follow-up plan.  Final Clinical Impression(s) / ED Diagnoses Final diagnoses:  Acute bilateral low back pain without sciatica  Bacterial UTI  Compression fracture of T12 vertebra, initial encounter [facility])     Rhae Hammock, PA-C 05/01/21 1629    Elnora Morrison, MD 05/01/21 1732

## 2021-05-01 NOTE — ED Provider Notes (Signed)
Care of patient assumed from [place]. Agree with history, physical exam and plan.  See their note for further details. Briefly, patient presents with lower back pain.  No recent falls or injuries, numbness, bowel or bladder dysfunction.  CT imaging obtained showed age-indeterminate T12 compression fracture. Urinalysis shows urinary tract infection. Prior to handoff [place] [facility] spoke with Dr. Reather Converse who are comfortable with patient being discharged home as long as she can ambulate.  Patient will be placed in TLSO brace.  Patient will need to follow-up with Dr. Deland Pretty.  Patient was able to stand and ambulate with TLSO brace.  Patient denies any pain at rest.  Patient endorses manageable pain with ambulation.  Will discharge patient with short course of Norco for pain management.  Patient given prescription for urinary tract infection.  Discussed results, findings, treatment and follow up. Patient advised of return precautions. Patient verbalized understanding and agreed with plan.    Loni Beckwith, PA-C 05/08/21 2359    Lorelle Gibbs, DO 05/09/21 0740

## 2021-05-01 NOTE — ED Triage Notes (Signed)
Pt arrived via PTAR for back pain. Per EMS pt has had back pain since sunday and since has fallen the last 2 days. Pt states it is in lower back that radiated to front, and describes as sharp pain denies pain getting worse.  190/90, 107HR, 94% RA

## 2021-05-01 NOTE — Discharge Instructions (Addendum)
You have been seen today for your complaint of back pain. Your lab work displayed a minor UTI Your imaging showed a compression to the T12 vertebrae Your discharge medications include a twice daily antibiotic (Macrobid) for 5 days to treat the urinary tract infection and the pain medication Norco. Home care instructions are as follows: Walk only with your walker and utilize back brace as instructed.  Follow up with: Neurosurgery within 5-7 days.  Return to the ER for worsening symptoms or falls.  Today you received medications that may make you sleepy or impair your ability to make decisions.  For the next 24 hours please do not drive, operate heavy machinery, care for a small child with out another adult present, or perform any activities that may cause harm to you or someone else if you were to fall asleep or be impaired.   You are being prescribed a medication which may make you sleepy. Please follow up of listed precautions for at least 24 hours after taking one dose.

## 2021-05-01 NOTE — Progress Notes (Signed)
Orthopedic Tech Progress Note Patient Details:  [name] [name] 30-Sep-1930 [id number]  Ortho Devices Ortho Device/Splint Location: TLSO Ortho Device/Splint Interventions: Ordered, Application, Adjustment   Post Interventions Patient Tolerated: Well Instructions Provided: Adjustment of device  [name] [name] 05/01/2021, 6:29 PM

## 2021-05-01 NOTE — ED Notes (Signed)
Report given to Tawni Levy, RN

## 2021-05-05 ENCOUNTER — Other Ambulatory Visit: Payer: Self-pay

## 2021-05-05 ENCOUNTER — Emergency Department (HOSPITAL_COMMUNITY): Payer: Medicare HMO

## 2021-05-05 ENCOUNTER — Observation Stay (HOSPITAL_COMMUNITY)
Admission: EM | Admit: 2021-05-05 | Discharge: 2021-05-09 | Disposition: A | Discharge status: Home / Self Care | Payer: Medicare HMO | Attending: Internal Medicine | Admitting: Internal Medicine

## 2021-05-05 ENCOUNTER — Encounter (HOSPITAL_COMMUNITY): Payer: Self-pay

## 2021-05-05 DIAGNOSIS — S3992XA Unspecified injury of lower back, initial encounter: Secondary | ICD-10-CM | POA: Diagnosis not present

## 2021-05-05 DIAGNOSIS — Y92009 Unspecified place in unspecified non-institutional (private) residence as the place of occurrence of the external cause: Secondary | ICD-10-CM | POA: Insufficient documentation

## 2021-05-05 DIAGNOSIS — I482 Chronic atrial fibrillation, unspecified: Secondary | ICD-10-CM | POA: Insufficient documentation

## 2021-05-05 DIAGNOSIS — I4891 Unspecified atrial fibrillation: Secondary | ICD-10-CM | POA: Diagnosis not present

## 2021-05-05 DIAGNOSIS — Z20822 Contact with and (suspected) exposure to covid-19: Secondary | ICD-10-CM | POA: Insufficient documentation

## 2021-05-05 DIAGNOSIS — E119 Type 2 diabetes mellitus without complications: Secondary | ICD-10-CM | POA: Diagnosis not present

## 2021-05-05 DIAGNOSIS — Z96642 Presence of left artificial hip joint: Secondary | ICD-10-CM | POA: Diagnosis not present

## 2021-05-05 DIAGNOSIS — E875 Hyperkalemia: Secondary | ICD-10-CM | POA: Diagnosis not present

## 2021-05-05 DIAGNOSIS — W19XXXA Unspecified fall, initial encounter: Secondary | ICD-10-CM | POA: Insufficient documentation

## 2021-05-05 DIAGNOSIS — J449 Chronic obstructive pulmonary disease, unspecified: Secondary | ICD-10-CM | POA: Diagnosis not present

## 2021-05-05 DIAGNOSIS — H409 Unspecified glaucoma: Secondary | ICD-10-CM | POA: Diagnosis present

## 2021-05-05 DIAGNOSIS — S22080S Wedge compression fracture of T11-T12 vertebra, sequela: Secondary | ICD-10-CM | POA: Diagnosis not present

## 2021-05-05 DIAGNOSIS — Z79899 Other long term (current) drug therapy: Secondary | ICD-10-CM | POA: Insufficient documentation

## 2021-05-05 DIAGNOSIS — E1140 Type 2 diabetes mellitus with diabetic neuropathy, unspecified: Secondary | ICD-10-CM

## 2021-05-05 DIAGNOSIS — Z7901 Long term (current) use of anticoagulants: Secondary | ICD-10-CM | POA: Insufficient documentation

## 2021-05-05 DIAGNOSIS — E870 Hyperosmolality and hypernatremia: Secondary | ICD-10-CM | POA: Diagnosis not present

## 2021-05-05 DIAGNOSIS — R000 Tachycardia, unspecified: Secondary | ICD-10-CM | POA: Diagnosis not present

## 2021-05-05 DIAGNOSIS — S22080A Wedge compression fracture of T11-T12 vertebra, initial encounter for closed fracture: Secondary | ICD-10-CM | POA: Diagnosis not present

## 2021-05-05 DIAGNOSIS — J811 Chronic pulmonary edema: Secondary | ICD-10-CM | POA: Diagnosis not present

## 2021-05-05 DIAGNOSIS — I340 Nonrheumatic mitral (valve) insufficiency: Secondary | ICD-10-CM | POA: Diagnosis present

## 2021-05-05 DIAGNOSIS — F419 Anxiety disorder, unspecified: Secondary | ICD-10-CM | POA: Diagnosis present

## 2021-05-05 DIAGNOSIS — R296 Repeated falls: Secondary | ICD-10-CM | POA: Diagnosis not present

## 2021-05-05 DIAGNOSIS — M549 Dorsalgia, unspecified: Secondary | ICD-10-CM | POA: Diagnosis not present

## 2021-05-05 DIAGNOSIS — F32A Depression, unspecified: Secondary | ICD-10-CM | POA: Diagnosis present

## 2021-05-05 DIAGNOSIS — F339 Major depressive disorder, recurrent, unspecified: Secondary | ICD-10-CM | POA: Diagnosis present

## 2021-05-05 DIAGNOSIS — I517 Cardiomegaly: Secondary | ICD-10-CM | POA: Diagnosis not present

## 2021-05-05 DIAGNOSIS — I10 Essential (primary) hypertension: Secondary | ICD-10-CM | POA: Insufficient documentation

## 2021-05-05 DIAGNOSIS — Z794 Long term (current) use of insulin: Secondary | ICD-10-CM | POA: Insufficient documentation

## 2021-05-05 DIAGNOSIS — I959 Hypotension, unspecified: Secondary | ICD-10-CM | POA: Diagnosis not present

## 2021-05-05 DIAGNOSIS — E7800 Pure hypercholesterolemia, unspecified: Secondary | ICD-10-CM | POA: Diagnosis present

## 2021-05-05 DIAGNOSIS — S24104A Unspecified injury at T11-T12 level of thoracic spinal cord, initial encounter: Secondary | ICD-10-CM | POA: Diagnosis present

## 2021-05-05 DIAGNOSIS — K219 Gastro-esophageal reflux disease without esophagitis: Secondary | ICD-10-CM | POA: Diagnosis present

## 2021-05-05 DIAGNOSIS — M48061 Spinal stenosis, lumbar region without neurogenic claudication: Secondary | ICD-10-CM | POA: Diagnosis not present

## 2021-05-05 DIAGNOSIS — Z7984 Long term (current) use of oral hypoglycemic drugs: Secondary | ICD-10-CM | POA: Insufficient documentation

## 2021-05-05 HISTORY — DX: Wedge compression fracture of t11-T12 vertebra, sequela: S22.080S

## 2021-05-05 LAB — RESP PANEL BY RT-PCR (FLU A&B, COVID) ARPGX2
Influenza A by PCR: NEGATIVE
Influenza B by PCR: NEGATIVE
SARS Coronavirus 2 by RT PCR: NEGATIVE

## 2021-05-05 LAB — BASIC METABOLIC PANEL
Anion gap: 12 (ref 5–15)
BUN: 14 mg/dL (ref 8–23)
CO2: 27 mmol/L (ref 22–32)
Calcium: 9.1 mg/dL (ref 8.9–10.3)
Chloride: 95 mmol/L — ABNORMAL LOW (ref 98–111)
Creatinine, Ser: 0.82 mg/dL (ref 0.44–1.00)
GFR, Estimated: >60 mL/min (ref >60)
Glucose, Bld: 123 mg/dL — ABNORMAL HIGH (ref 70–99)
Potassium: 3.3 mmol/L — ABNORMAL LOW (ref 3.5–5.1)
Sodium: 134 mmol/L — ABNORMAL LOW (ref 135–145)

## 2021-05-05 LAB — CBC WITH DIFFERENTIAL/PLATELET
Abs Immature Granulocytes: 0.14 10*3/uL — ABNORMAL HIGH (ref 0.00–0.07)
Basophils Absolute: 0.1 10*3/uL (ref 0.0–0.1)
Basophils Relative: 1 %
Eosinophils Absolute: 0.2 10*3/uL (ref 0.0–0.5)
Eosinophils Relative: 2 %
HCT: 40.5 % (ref 36.0–46.0)
Hemoglobin: 13.3 g/dL (ref 12.0–15.0)
Immature Granulocytes: 1 %
Lymphocytes Relative: 21 %
Lymphs Abs: 2.2 10*3/uL (ref 0.7–4.0)
MCH: 30.7 pg (ref 26.0–34.0)
MCHC: 32.8 g/dL (ref 30.0–36.0)
MCV: 93.5 fL (ref 80.0–100.0)
Monocytes Absolute: 0.7 10*3/uL (ref 0.1–1.0)
Monocytes Relative: 7 %
Neutro Abs: 6.8 10*3/uL (ref 1.7–7.7)
Neutrophils Relative %: 68 %
Platelets: 241 10*3/uL (ref 150–400)
RBC: 4.33 MIL/uL (ref 3.87–5.11)
RDW: 15.1 % (ref 11.5–15.5)
WBC: 10.1 10*3/uL (ref 4.0–10.5)
nRBC: 0 % (ref 0.0–0.2)

## 2021-05-05 MED ORDER — OXYCODONE-ACETAMINOPHEN 5-325 MG PO TABS
1.0000 | ORAL_TABLET | Freq: Once | ORAL | Status: AC
Start: 2021-05-05 — End: 2021-05-05
  Administered 2021-05-05: 1 via ORAL
  Filled 2021-05-05: qty 1

## 2021-05-05 NOTE — ED Triage Notes (Signed)
T recently dx w/T12 transverse compression fracture, placed in a back brace, fell Thursday and today. Pt reports she is unable to walk since falling today. Spouse reports she was supposed to stay over night until she could walk.  BP 152/62 HR 124 A-fib CBG 208 96.6

## 2021-05-05 NOTE — H&P (Signed)
History and Physical    [name] [name] [id number] DOB: [birth year] DOA: 05/05/2021  PCP: [name], MD  Patient coming from: Home  I have personally briefly reviewed patient's old medical records in [place]  Chief Complaint: Thoracic back pain  HPI: [name] [name] is a 87 y.o. female with medical history significant of atrial fib on Coumadin, carotid stenosis, COPD, type 2 diabetes, hypertension, hyperlipidemia, mitral regurgitation who was seen approximately 3 days ago with severe back pain.  Work-up at that time revealed a new T12 compression fracture.  The patient was asked to be admitted for observation but her husband and she decided to try to make it at home.  She was put in a T LSO brace and sent home.  Since her time of being home she has had pain medications which are not very effective.  Additionally she has been really unable to walk and has had to use a wheelchair.  She is in so much pain and she and her husband who are both elderly are having a lot of difficulty managing any of this at home.  She saw her primary care today who advised her to return to the hospital and asked for admission with possible eval for rehabilitation or ongoing needs for pain medication.  Patient denies fevers chills, chest pain, shortness of breath, really just complains of back pain is midthoracic radiates around to the front and is worse with trying to stand or walk.  As long as she is recumbent it seems to be okay and she seems to be sleeping well.  Diagnosed with UTI at her last visit on [place] currently. ED Course: In the ED she was noted to have a low potassium but labs were otherwise normal.  CT revealed unchanged recent T12 split type fracture and moderate bilateral L4 foraminal stenosis and moderate L4-L5 spinal canal stenosis.  CT also noted mild cardiomegaly and mild pulmonary edema.  Review of Systems: As per HPI otherwise 10 point review of systems negative.   Past  Medical History:  Diagnosis Date   Atrial fibrillation ([place])    RVR hx.  Chronic Coumadin   Bacteremia 10/2018   C. difficile colitis 09/21/2013, 11/2013   Carotid stenosis    a. Carotid US (10/2013):  bilat 1-39%; f/u 1 year   Chronic lower back pain    COPD (chronic obstructive pulmonary disease) (HCC)    Depression    Diverticulosis of colon without hemorrhage    Gait abnormality 04/11/2020   GERD (gastroesophageal reflux disease)    with HH   Glaucoma    HCAP (healthcare-associated pneumonia) 11/21/2011   High cholesterol    Hypertension    Insomnia    Memory disorder [id number]   Metabolic encephalopathy 0000000   Mitral regurgitation 03/11/2012   Pancreatitis    ~2008, 11/2011 post ERCP   Pneumonia 12/2011   "first time I know about"   PVD (peripheral vascular disease) ([place])    Sepsis ([place]) 11/27/2013   SIRS (systemic inflammatory response syndrome) ([place]) 03/13/2012   a/w post ERCP  pancreatitis.    Syncope    Type II diabetes mellitus ([place])    Unsteady gait     Past Surgical History:  Procedure Laterality Date   APPENDECTOMY  ~ 1960   CATARACT EXTRACTION W/ INTRAOCULAR LENS  IMPLANT, BILATERAL  2000's   CHOLECYSTECTOMY  1990's   Lap chole   COLONOSCOPY  01/2003   diverticulosis, 63m hyperplastic sigmoid polyp   COLONOSCOPY WITH PROPOFOL N/A 06/02/2018  Procedure: COLONOSCOPY WITH PROPOFOL;  Surgeon: Lavena Bullion, DO;  Location: [facility];  Service: Gastroenterology;  Laterality: N/A;   DILATION AND CURETTAGE OF UTERUS     ERCP  11/13/2011   Procedure: ENDOSCOPIC RETROGRADE CHOLANGIOPANCREATOGRAPHY (ERCP);  Surgeon: Beryle Beams, MD;  Location: [facility] ENDOSCOPY;  Service: Endoscopy;  Laterality: N/A;   FLEXIBLE SIGMOIDOSCOPY N/A 05/02/2014   Procedure: FLEXIBLE SIGMOIDOSCOPY;  Surgeon: Gatha Mayer, MD;  Location: [facility] ENDOSCOPY;  Service: Endoscopy;  Laterality: N/A;   HIP ARTHROPLASTY Left 01/07/2018   Procedure: ARTHROPLASTY BIPOLAR HIP (HEMIARTHROPLASTY);  Surgeon:  Hiram Gash, MD;  Location: [facility];  Service: Orthopedics;  Laterality: Left;   TONSILLECTOMY AND ADENOIDECTOMY  ~ [place]  ~ 1960's     reports that she has never smoked. She has never used smokeless tobacco. She reports that she does not drink alcohol and does not use drugs.  Allergies  Allergen Reactions   Lactose Intolerance (Gi) Nausea And Vomiting and Other (See Comments)    severe stomach pain   Penicillins Hives and Itching    "haven't used any since 1970's" - have gotten cephalosporins multiple times Has patient had a PCN reaction causing immediate rash, facial/tongue/throat swelling, SOB or lightheadedness with hypotension: Yes Has patient had a PCN reaction causing severe rash involving mucus membranes or skin necrosis: Unk Has patient had a PCN reaction that required hospitalization: Unk Has patient had a PCN reaction occurring within the last 10 years: No If all of the above answers are "NO", then may proceed with C   Sulfa Antibiotics Hives and Itching    "haven't used any since 1970's"   Ciprofloxacin Swelling    Site of swelling not recalled   Lisinopril     Other reaction(s): associated with hyperkalemia   Sulfasalazine Hives and Itching    "haven't used any since 1970's"    Family History  Problem Relation Age of Onset   Stroke Father 48   Stroke Mother 33   Diabetes Mellitus II Mother    Heart disease Mother    Hypertension Mother    Diabetes Mellitus II Sister    Diabetes Mellitus II Brother    Anesthesia problems Neg Hx    Hypotension Neg Hx    Malignant hyperthermia Neg Hx    Pseudochol deficiency Neg Hx    Stomach cancer Neg Hx    Esophageal cancer Neg Hx      Prior to Admission medications   Medication Sig Start Date End Date Taking? Authorizing Provider  acetaminophen (TYLENOL) 325 MG tablet Take 650 mg by mouth every 6 (six) hours as needed for mild pain.     [provider]  albuterol (PROVENTIL) (2.5 MG/3ML)  0.083% nebulizer solution take 1 treatment up to 3 times daily as needed for difficulty breathing 07/01/18   [provider]  BD PEN NEEDLE NANO U/F 32G X 4 MM MISC  05/13/20   [provider]  clotrimazole (LOTRIMIN) 1 % cream apply to groin bid x 2 wks, may repeat x 2 wks Patient not taking: No sig reported 02/21/18   [provider]  dexamethasone (DECADRON) 6 MG tablet  06/22/20   [provider]  diltiazem (CARDIZEM CD) 120 MG 24 hr capsule  11/01/19   [provider]  diltiazem (DILT-XR) 120 MG 24 hr capsule TAKE 1 CAPSULE(120 MG) BY MOUTH DAILY Patient taking differently: Take 120 mg by mouth daily. 01/04/18   Minus Breeding, MD  doxepin (  SINEQUAN) 10 MG capsule  11/02/19   [provider]  furosemide (LASIX) 40 MG tablet Take 40 mg by mouth. Take one tablet (40 mg) by mouth once a day    [provider]  gabapentin (NEURONTIN) 100 MG capsule  04/08/20   [provider]  gabapentin (NEURONTIN) 300 MG capsule  11/01/19   [provider]  glimepiride (AMARYL) 4 MG tablet  11/01/19   [provider]  guaiFENesin (MUCINEX) 600 MG 12 hr tablet Take 600 mg by mouth 2 (two) times daily. Patient not taking: No sig reported    [provider]  guaiFENesin (ROBITUSSIN) 100 MG/5ML SOLN Take 5 mLs by mouth every 6 (six) hours as needed for cough or to loosen phlegm. Patient not taking: No sig reported    [provider]  hydrochlorothiazide (MICROZIDE) 12.5 MG capsule TAKE 1 CAPSULE BY MOUTH EVERY OTHER DAY AS NEEDED FOR BILATERAL LOWER EXTREMITY EDEMA 02/21/19   [provider]  Insulin Glargine, 1 Unit Dial, 300 UNIT/ML SOPN Inject into the skin daily.  10/12/19   [provider]  Lancets (ONETOUCH DELICA PLUS Q000111Q) [place] 1 each by [place].(Non-Drug; Combo Route) route in the morning and at bedtime. DX:  E11.9 07/09/20 07/09/21  [provider]  latanoprost (XALATAN) 0.005 %  ophthalmic solution Place 1 drop into both eyes at bedtime. Patient not taking: No sig reported    [provider]  loperamide (LOPERAMIDE A-D) 2 MG tablet Take 1 tablet (2 mg total) by mouth as needed for diarrhea or loose stools (use at bedtime). Patient taking differently: Take 1-2 mg by mouth at bedtime as needed for diarrhea or loose stools. 11/27/14   Gatha Mayer, MD  LORazepam (ATIVAN) 1 MG tablet Take 1 tablet by mouth daily. 08/27/14   [provider]  Melatonin 5 MG TABS Take 5 mg by mouth at bedtime. Patient not taking: No sig reported    [provider]  metoprolol tartrate (LOPRESSOR) 25 MG tablet Take 12.5 mg by mouth 2 (two) times daily.    [provider]  mirtazapine (REMERON) 7.5 MG tablet TAKE 1 TABLET(7.5 MG) BY MOUTH AT BEDTIME Patient not taking: No sig reported 10/06/16   Gatha Mayer, MD  Multiple Vitamin (MULTIVITAMIN WITH MINERALS) TABS Take 1 tablet by mouth daily.    [provider]  nitrofurantoin, macrocrystal-monohydrate, (MACROBID) 100 MG capsule Take 1 capsule (100 mg total) by mouth 2 (two) times daily. 05/01/21   Loni Beckwith, PA-C  nystatin (MYCOSTATIN) 100000 UNIT/ML suspension  07/18/20   [provider]  ondansetron (ZOFRAN) 4 MG tablet  05/06/20   [provider]  oseltamivir (TAMIFLU) 30 MG capsule Take 1 capsule (30 mg total) by mouth 2 (two) times daily. Patient not taking: No sig reported 11/28/18   Debbe Odea, MD  oxybutynin (DITROPAN) 5 MG tablet Take 5 mg by mouth at bedtime.  07/29/16   [provider]  pantoprazole (PROTONIX) 40 MG tablet Take 1 tablet (40 mg total) by mouth 2 (two) times daily. 06/07/18 11/27/27  Arrien, Jimmy Picket, MD  PFIZER-BIONTECH COVID-19 VACC 30 MCG/0.3ML injection  11/05/20   [provider]  pilocarpine (PILOCAR) 1 % ophthalmic solution Place 1 drop into both eyes in the morning and at bedtime. 08/28/20   [provider]   potassium chloride SA (KLOR-CON) 20 MEQ tablet  05/04/20   [provider]  Probiotic Product (PROBIOTIC PO) Take 1 capsule by mouth daily.  [provider]  promethazine (PHENERGAN) 25 MG tablet Take 25 mg by mouth 3 (three) times daily as needed for nausea or vomiting.    [provider]  rOPINIRole (REQUIP) 0.5 MG tablet Take 0.5 mg by mouth See admin instructions. Take 1 tablet at 4 pm and at bedtime Patient not taking: No sig reported 10/19/18   [provider]  rosuvastatin (CRESTOR) 10 MG tablet daily.  11/01/19   [provider]  sucralfate (CARAFATE) 1 g tablet Take one tablet BID Patient not taking: No sig reported 06/28/18   [provider]  SYMBICORT 160-4.5 MCG/ACT inhaler Inhale 2 puffs into the lungs 2 (two) times daily.  Patient not taking: No sig reported 03/08/14   [provider]  timolol (TIMOPTIC) 0.5 % ophthalmic solution  09/21/19   [provider]  warfarin (COUMADIN) 2 MG tablet Take 2 mg by mouth daily. Patient not taking: No sig reported    [provider]  warfarin (COUMADIN) 2.5 MG tablet  04/08/20   [provider]  zolpidem (AMBIEN) 10 MG tablet Take 1 tablet by mouth at bedtime as needed. Patient not taking: No sig reported 07/27/17   [provider]    Physical Exam:  Constitutional: NAD, calm, comfortable Vitals:   05/05/21 1452 05/05/21 1805 05/05/21 2101  BP: (!) 114/51 (!) 128/59 (!) 117/59  Pulse: 84 81 82  Resp: '18 14 15  '$ Temp: 98 F (36.7 C) 98.5 F (36.9 C)   TempSrc: Oral    SpO2: 92% 94% 95%   Eyes: PERRL, lids and conjunctivae normal ENMT: Mucous membranes are moist. Posterior pharynx clear of any exudate or lesions.poor dentition Neck: normal, supple, no masses, no thyromegaly, carotid bruit Respiratory: clear to auscultation bilaterally, no wheezing, no crackles. Normal respiratory effort. No accessory muscle use.  Cardiovascular: Regular  rate and rhythm, 2 out of 6 murmur / rubs / gallops. No extremity edema. 2+ pedal pulses.  Abdomen: no tenderness, no masses palpated. No hepatosplenomegaly. Bowel sounds positive.  Musculoskeletal: no clubbing / cyanosis. No joint deformity upper and lower extremities. Good ROM, no contractures. Normal muscle tone.  In a TLSO brace, back tender to palpation Skin: no rashes, lesions, ulcers. No induration Neurologic: Grossly normal Psychiatric: Normal judgment and insight. Alert and oriented x 3. Normal mood.   Labs on Admission: I have personally reviewed following labs and imaging studies  CBC: Recent Labs  Lab 05/01/21 1303 05/05/21 2149  WBC 12.8* 10.1  NEUTROABS 9.9* 6.8  HGB 13.2 13.3  HCT 40.0 40.5  MCV 92.2 93.5  PLT 172 [id number]   Basic Metabolic Panel: Recent Labs  Lab 05/01/21 1303 05/05/21 2149  NA 138 134*  K 3.5 3.3*  CL 99 95*  CO2 27 27  GLUCOSE 163* 123*  BUN 15 14  CREATININE 0.91 0.82  CALCIUM 9.3 9.1   GFR: Estimated Creatinine Clearance: 37.4 mL/min (by C-G formula based on SCr of 0.82 mg/dL).  Urine analysis:    Component Value Date/Time   COLORURINE YELLOW 05/01/2021 1121   APPEARANCEUR HAZY (A) 05/01/2021 1121   LABSPEC 1.010 05/01/2021 1121   PHURINE 8.5 (H) 05/01/2021 1121   GLUCOSEU NEGATIVE 05/01/2021 1121   HGBUR SMALL (A) 05/01/2021 1121   BILIRUBINUR NEGATIVE 05/01/2021 1121   KETONESUR NEGATIVE 05/01/2021 1121   PROTEINUR 30 (A) 05/01/2021 1121   UROBILINOGEN 0.2 11/23/2013 1431   NITRITE NEGATIVE 05/01/2021 1121   LEUKOCYTESUR LARGE (A) 05/01/2021 1121    Radiological Exams on Admission:  CT Thoracic Spine Wo Contrast  Result Date: 05/05/2021 CLINICAL DATA:  Falls with T12 compression fracture EXAM: CT THORACIC AND LUMBAR SPINE WITHOUT CONTRAST TECHNIQUE: Multidetector CT imaging of the thoracic and lumbar spine was performed without contrast. Multiplanar CT image reconstructions were also generated. COMPARISON:  CT abdomen pelvis  05/01/2021 FINDINGS: CT THORACIC SPINE FINDINGS Alignment: Normal. Vertebrae: Recent T12 fracture is unchanged. There is no new fracture. Paraspinal and other soft tissues: Calcific aortic atherosclerosis. Mild pulmonary edema. Cardiomegaly. Disc levels: No spinal canal stenosis CT LUMBAR SPINE FINDINGS Segmentation: 5 lumbar type vertebrae. Alignment: Grade 1 anterolisthesis at L4-5 Vertebrae: No acute fracture. Multiple large Schmorl's nodes. Endplate sclerosis at L4 and L5. Paraspinal and other soft tissues: Calcific aortic atherosclerosis. Disc levels: Moderate bilateral L4 foraminal stenosis and moderate L4-5 spinal canal stenosis secondary to disc bulge and facet hypertrophy. No spinal canal stenosis. IMPRESSION: 1. Recent T12 split type fracture (AO Spine A2) is unchanged. No new fracture. 2. Moderate bilateral L4 foraminal stenosis and moderate L4-5 spinal canal stenosis. 3. Cardiomegaly and mild pulmonary edema. Aortic Atherosclerosis (ICD10-I70.0). Electronically Signed   By: Ulyses Jarred M.D.   On: 05/05/2021 19:19   CT Lumbar Spine Wo Contrast  Result Date: 05/05/2021 CLINICAL DATA:  Falls with T12 compression fracture EXAM: CT THORACIC AND LUMBAR SPINE WITHOUT CONTRAST TECHNIQUE: Multidetector CT imaging of the thoracic and lumbar spine was performed without contrast. Multiplanar CT image reconstructions were also generated. COMPARISON:  CT abdomen pelvis 05/01/2021 FINDINGS: CT THORACIC SPINE FINDINGS Alignment: Normal. Vertebrae: Recent T12 fracture is unchanged. There is no new fracture. Paraspinal and other soft tissues: Calcific aortic atherosclerosis. Mild pulmonary edema. Cardiomegaly. Disc levels: No spinal canal stenosis CT LUMBAR SPINE FINDINGS Segmentation: 5 lumbar type vertebrae. Alignment: Grade 1 anterolisthesis at L4-5 Vertebrae: No acute fracture. Multiple large Schmorl's nodes. Endplate sclerosis at L4 and L5. Paraspinal and other soft tissues: Calcific aortic atherosclerosis. Disc  levels: Moderate bilateral L4 foraminal stenosis and moderate L4-5 spinal canal stenosis secondary to disc bulge and facet hypertrophy. No spinal canal stenosis. IMPRESSION: 1. Recent T12 split type fracture (AO Spine A2) is unchanged. No new fracture. 2. Moderate bilateral L4 foraminal stenosis and moderate L4-5 spinal canal stenosis. 3. Cardiomegaly and mild pulmonary edema. Aortic Atherosclerosis (ICD10-I70.0). Electronically Signed   By: Ulyses Jarred M.D.   On: 05/05/2021 19:19    Assessment/Plan Principal Problem:   T12 compression fracture, sequela Active Problems:   DM II (diabetes mellitus, type II), controlled (HCC)   HTN (hypertension)   Hypercholesteremia   Glaucoma (increased eye pressure)   Atrial fibrillation    Mitral regurgitation   Depression   Anxiety   COPD (chronic obstructive pulmonary disease) (HCC)   Major depressive disorder, recurrent episode (HCC)   Type 2 diabetes mellitus without retinopathy ([place])   Gastro-esophageal reflux disease without esophagitis   T12 compression fracture Patient here with significant pain due to this compression fracture.  She is already in TLSO brace Consider Ortho or neurosurgery consultation if needed Transition of care consult in the a.m. PT Pain control  Type 2 diabetes Continue Amaryl, sliding scale insulin  Hypertension Continue HCTZ, Cardizem, Lasix  Hypokalemia Replete and trend  Hypercholesterolemia Continue Crestor  Atrial fibrillation On Coumadin Check INR Rate controlled at present  Mitral regurgitation Mild in 2015, per cardiology no further follow-up is needed.  COPD Albuterol as needed  GERD Continue sucralfate  Depression Continue doxepin  Anxiety Continue doxepin  Glaucoma Continue pilocarpine  UTI Complete course of Macrobid  DVT prophylaxis: [id number] Code Status: Full code  Family Communication: Husband at bedside Disposition Plan: Home with home health versus CIR Consults  called: None Admission status: Observation patient is in observation status pending safe discharge plan.   Donnamae Jude MD [facility]  If 7PM-7AM, please contact night-coverage 05/05/2021, 11:14 PM

## 2021-05-05 NOTE — ED Provider Notes (Signed)
Emergency Medicine Provider Triage Evaluation Note  [name] [name] , a 87 y.o. female  was evaluated in triage.  Pt complains of worsening back pain after multiple falls, recently diagnosed T12 fracture in brace but has had some falls since braces placed with worsening pain.  Review of Systems  Positive: Back pain Negative: Numbness, ting, weakness in her legs, urinary incontinence, saddle anesthesi  Physical Exam  BP (!) 114/51   Pulse 84   Temp 98 F (36.7 C) (Oral)   Resp 18   SpO2 92%  Gen:   Awake, no distress   Resp:  Normal effort  MSK:   Moves extremities without difficulty  Other:  Prone and back brace, neurovascularly intact in lower extremities.  Unable to sit up due to pain.  Medical Decision Making  Medically screening exam initiated at 5:43 PM.  Appropriate orders placed.  Amyia Murray-Rush was informed that the remainder of the evaluation will be completed by another provider, this initial triage assessment does not replace that evaluation, and the importance of remaining in the ED until their evaluation is complete.  This chart was dictated using voice recognition software, Dragon. Despite the best efforts of this provider to proofread and correct errors, errors may still occur which can change documentation meaning.    Emeline Darling, PA-C 05/05/21 [place], [place], DO 05/05/21 2318

## 2021-05-05 NOTE — ED Provider Notes (Signed)
[facility] EMERGENCY DEPARTMENT Provider Note   CSN: [id number] Arrival date & time: 05/05/21  1424     History Chief Complaint  Patient presents with   Back Pain    [name] [name] is a 87 y.o. female.  87 yo F with a chief complaints of back pain.  Patient was recently seen after having suffered a fall and having some back pain that radiates to her abdomen.  Found to have a new T12 compression fracture.  Was placed in a TLSO and was going to follow-up with neurosurgery in the office however the patient has had worsening pain and her husband who lives with her at home is been unable to safely move her around the house.  They had seen the family doctor couple times over the course of this week and were encouraged to bring her back to the hospital for admission and physical therapy.  The history is provided by the patient.  Back Pain Location:  Thoracic spine Quality:  Aching, shooting and stabbing Radiates to: abdomen. Pain severity:  Moderate Onset quality:  Sudden Duration:  4 days Timing:  Constant Progression:  Worsening Chronicity:  New Associated symptoms: no chest pain, no dysuria, no fever and no headaches       Past Medical History:  Diagnosis Date   Atrial fibrillation ([place])    RVR hx.  Chronic Coumadin   Bacteremia 10/2018   C. difficile colitis 09/21/2013, 11/2013   Carotid stenosis    a. Carotid US (10/2013):  bilat 1-39%; f/u 1 year   Chronic lower back pain    COPD (chronic obstructive pulmonary disease) (HCC)    Depression    Diverticulosis of colon without hemorrhage    Gait abnormality 04/11/2020   GERD (gastroesophageal reflux disease)    with HH   Glaucoma    HCAP (healthcare-associated pneumonia) 11/21/2011   High cholesterol    Hypertension    Insomnia    Memory disorder [id number]   Metabolic encephalopathy 0000000   Mitral regurgitation 03/11/2012   Pancreatitis    ~2008, 11/2011 post ERCP   Pneumonia 12/2011   "first time  I know about"   PVD (peripheral vascular disease) ([place])    Sepsis ([place]) 11/27/2013   SIRS (systemic inflammatory response syndrome) ([place]) 03/13/2012   a/w post ERCP  pancreatitis.    Syncope    Type II diabetes mellitus ([place])    Unsteady gait     Patient Active Problem List   Diagnosis Date Noted   T12 compression fracture, sequela 05/05/2021   Pain due to onychomycosis of toenails of both feet 01/29/2021   Gait abnormality 04/11/2020   Memory disorder 04/11/2020   Disorder of tooth development 01/30/2020   Rupture of right rotator cuff 01/30/2020   Primary open angle glaucoma (POAG) of both eyes, moderate stage 11/09/2019   Chronic atrial fibrillation ([place]) 09/11/2019   Dizziness 09/11/2019   Educated about COVID-19 virus infection 09/11/2019   Localized edema 02/21/2019   Pincer nail deformity 12/26/2018   Cellulitis of finger of right hand 12/21/2018   Branch retinal vein occlusion of left eye with macular edema 12/15/2018   Intermediate stage nonexudative age-related macular degeneration of both eyes 12/15/2018   Pseudophakia of both eyes 12/15/2018   Influenza A 11/28/2018   Acute encephalopathy 11/26/2018   Bacteremia 10/26/2018   SIRS (systemic inflammatory response syndrome) ([place]) 10/25/2018   Dysphagia 10/11/2018   Pain in right foot 10/11/2018   Restless legs syndrome 07/01/2018   Hemorrhoids  Shock ([place])    Candidiasis of skin and nails 02/21/2018   DM neuropathy, type II diabetes mellitus ([place]) 01/07/2018   Hip fracture ([place]) 01/06/2018   Advance care planning    Goals of care, counseling/discussion    COPD (chronic obstructive pulmonary disease) ([place]) 10/28/2017   Overactive bladder 07/29/2016   RLQ abdominal pain 04/24/2014   Nausea alone 04/11/2014   Major depressive disorder, recurrent episode ([place]) 01/16/2014   Generalized anxiety disorder 10/24/2013   Type 2 diabetes mellitus without retinopathy ([place]) 03/11/2013   Insomnia 11/28/2012   Abdominal pain  08/05/2012    Class: Acute   Gastro-esophageal reflux disease without esophagitis 05/20/2012   Low back pain 05/20/2012   Noninfective gastroenteritis and colitis, unspecified 05/20/2012   Irritable bowel syndrome 04/12/2012   Chronic diarrhea 04/12/2012   Encounter for long-term (current) use of anticoagulants 03/17/2012   Depression 03/13/2012   Anxiety 03/13/2012   Mitral regurgitation 03/11/2012   Chronic epigastric pain 01/25/2012   Coagulopathy ([place]) 12/12/2011   Symptomatic anemia 12/12/2011   Atrial fibrillation     UTI (urinary tract infection) 11/13/2011   DM II (diabetes mellitus, type II), controlled ([place]) 11/13/2011   HTN (hypertension) 11/13/2011   Hypercholesteremia 11/13/2011   Glaucoma (increased eye pressure) 11/13/2011    Past Surgical History:  Procedure Laterality Date   APPENDECTOMY  ~ 1960   CATARACT EXTRACTION W/ INTRAOCULAR LENS  IMPLANT, BILATERAL  2000's   CHOLECYSTECTOMY  1990's   Lap chole   COLONOSCOPY  01/2003   diverticulosis, 58m hyperplastic sigmoid polyp   COLONOSCOPY WITH PROPOFOL N/A 06/02/2018   Procedure: COLONOSCOPY WITH PROPOFOL;  Surgeon: CLavena Bullion DO;  [facility]  Service: Gastroenterology;  Laterality: N/A;   DILATION AND CURETTAGE OF UTERUS     ERCP  11/13/2011   Procedure: ENDOSCOPIC RETROGRADE CHOLANGIOPANCREATOGRAPHY (ERCP);  Surgeon: PBeryle Beams MD;  [facility]  Service: Endoscopy;  Laterality: N/A;   FLEXIBLE SIGMOIDOSCOPY N/A 05/02/2014   Procedure: FLEXIBLE SIGMOIDOSCOPY;  Surgeon: CGatha Mayer MD;  Location: [facility];  Service: Endoscopy;  Laterality: N/A;   HIP ARTHROPLASTY Left 01/07/2018   Procedure: ARTHROPLASTY BIPOLAR HIP (HEMIARTHROPLASTY);  Surgeon: VHiram Gash MD;  [facility]  Service: Orthopedics;  Laterality: Left;   TONSILLECTOMY AND ADENOIDECTOMY  ~ 1[place] ~ 1960's     OB History   No obstetric history on file.     Family History   Problem Relation Age of Onset   Stroke Father 969  Stroke Mother 874  Diabetes Mellitus II Mother    Heart disease Mother    Hypertension Mother    Diabetes Mellitus II Sister    Diabetes Mellitus II Brother    Anesthesia problems Neg Hx    Hypotension Neg Hx    Malignant hyperthermia Neg Hx    Pseudochol deficiency Neg Hx    Stomach cancer Neg Hx    Esophageal cancer Neg Hx     Social History   Tobacco Use   Smoking status: Never   Smokeless tobacco: Never  Vaping Use   Vaping Use: Never used  Substance Use Topics   Alcohol use: No   Drug use: No    Home Medications Prior to Admission medications   Medication Sig Start Date End Date Taking? Authorizing Provider  acetaminophen (TYLENOL) 325 MG tablet Take 650 mg by mouth every 6 (six) hours as needed for mild pain.     [provider]  albuterol (PROVENTIL) (2.5 MG/3ML) 0.083% nebulizer solution take 1 treatment up to 3 times daily as needed for difficulty breathing 07/01/18   [provider]  BD PEN NEEDLE NANO U/F 32G X 4 MM MISC  05/13/20   [provider]  clotrimazole (LOTRIMIN) 1 % cream apply to groin bid x 2 wks, may repeat x 2 wks Patient not taking: No sig reported 02/21/18   [provider]  dexamethasone (DECADRON) 6 MG tablet  06/22/20   [provider]  diltiazem (CARDIZEM CD) 120 MG 24 hr capsule  11/01/19   [provider]  diltiazem (DILT-XR) 120 MG 24 hr capsule TAKE 1 CAPSULE(120 MG) BY MOUTH DAILY Patient taking differently: Take 120 mg by mouth daily. 01/04/18   Minus Breeding, MD  doxepin (SINEQUAN) 10 MG capsule  11/02/19   [provider]  furosemide (LASIX) 40 MG tablet Take 40 mg by mouth. Take one tablet (40 mg) by mouth once a day    [provider]  gabapentin (NEURONTIN) 100 MG capsule  04/08/20   [provider]  gabapentin (NEURONTIN) 300 MG capsule  11/01/19   [provider]  glimepiride (AMARYL) 4 MG tablet   11/01/19   [provider]  guaiFENesin (MUCINEX) 600 MG 12 hr tablet Take 600 mg by mouth 2 (two) times daily. Patient not taking: No sig reported    [provider]  guaiFENesin (ROBITUSSIN) 100 MG/5ML SOLN Take 5 mLs by mouth every 6 (six) hours as needed for cough or to loosen phlegm. Patient not taking: No sig reported    [provider]  hydrochlorothiazide (MICROZIDE) 12.5 MG capsule TAKE 1 CAPSULE BY MOUTH EVERY OTHER DAY AS NEEDED FOR BILATERAL LOWER EXTREMITY EDEMA 02/21/19   [provider]  Insulin Glargine, 1 Unit Dial, 300 UNIT/ML SOPN Inject into the skin daily.  10/12/19   [provider]  Lancets (ONETOUCH DELICA PLUS Q000111Q) [place] 1 each by [place].(Non-Drug; Combo Route) route in the morning and at bedtime. DX:  E11.9 07/09/20 07/09/21  [provider]  latanoprost (XALATAN) 0.005 % ophthalmic solution Place 1 drop into both eyes at bedtime. Patient not taking: No sig reported    [provider]  loperamide (LOPERAMIDE A-D) 2 MG tablet Take 1 tablet (2 mg total) by mouth as needed for diarrhea or loose stools (use at bedtime). Patient taking differently: Take 1-2 mg by mouth at bedtime as needed for diarrhea or loose stools. 11/27/14   Gatha Mayer, MD  LORazepam (ATIVAN) 1 MG tablet Take 1 tablet by mouth daily. 08/27/14   [provider]  Melatonin 5 MG TABS Take 5 mg by mouth at bedtime. Patient not taking: No sig reported    [provider]  metoprolol tartrate (LOPRESSOR) 25 MG tablet Take 12.5 mg by mouth 2 (two) times daily.    [provider]  mirtazapine (REMERON) 7.5 MG tablet TAKE 1 TABLET(7.5 MG) BY MOUTH AT BEDTIME Patient not taking: No sig reported 10/06/16   Gatha Mayer, MD  Multiple Vitamin (MULTIVITAMIN WITH MINERALS) TABS Take 1 tablet by mouth daily.    [provider]  nitrofurantoin, macrocrystal-monohydrate, (MACROBID) 100 MG capsule Take 1 capsule (100 mg  total) by mouth 2 (two) times daily. 05/01/21   Loni Beckwith, PA-C  nystatin (MYCOSTATIN) 100000 UNIT/ML suspension  07/18/20   [provider]  ondansetron (ZOFRAN) 4 MG tablet  05/06/20   [provider]  oseltamivir (TAMIFLU) 30 MG  capsule Take 1 capsule (30 mg total) by mouth 2 (two) times daily. Patient not taking: No sig reported 11/28/18   Debbe Odea, MD  oxybutynin (DITROPAN) 5 MG tablet Take 5 mg by mouth at bedtime.  07/29/16   [provider]  pantoprazole (PROTONIX) 40 MG tablet Take 1 tablet (40 mg total) by mouth 2 (two) times daily. 06/07/18 11/27/27  Arrien, Jimmy Picket, MD  PFIZER-BIONTECH COVID-19 VACC 30 MCG/0.3ML injection  11/05/20   [provider]  pilocarpine (PILOCAR) 1 % ophthalmic solution Place 1 drop into both eyes in the morning and at bedtime. 08/28/20   [provider]  potassium chloride SA (KLOR-CON) 20 MEQ tablet  05/04/20   [provider]  Probiotic Product (PROBIOTIC PO) Take 1 capsule by mouth daily.    [provider]  promethazine (PHENERGAN) 25 MG tablet Take 25 mg by mouth 3 (three) times daily as needed for nausea or vomiting.    [provider]  rOPINIRole (REQUIP) 0.5 MG tablet Take 0.5 mg by mouth See admin instructions. Take 1 tablet at 4 pm and at bedtime Patient not taking: No sig reported 10/19/18   [provider]  rosuvastatin (CRESTOR) 10 MG tablet daily.  11/01/19   [provider]  sucralfate (CARAFATE) 1 g tablet Take one tablet BID Patient not taking: No sig reported 06/28/18   [provider]  SYMBICORT 160-4.5 MCG/ACT inhaler Inhale 2 puffs into the lungs 2 (two) times daily.  Patient not taking: No sig reported 03/08/14   [provider]  timolol (TIMOPTIC) 0.5 % ophthalmic solution  09/21/19   [provider]  warfarin (COUMADIN) 2 MG tablet Take 2 mg by mouth daily. Patient not taking: No sig reported    [provider]  warfarin (COUMADIN) 2.5 MG tablet  04/08/20   [provider]  zolpidem (AMBIEN) 10 MG tablet Take 1 tablet by mouth at bedtime as needed. Patient not taking: No sig reported 07/27/17   [provider]    Allergies    Lactose intolerance (gi), Penicillins, Sulfa antibiotics, Ciprofloxacin, Lisinopril, and Sulfasalazine  Review of Systems   Review of Systems  Constitutional:  Negative for chills and fever.  HENT:  Negative for congestion and rhinorrhea.   Eyes:  Negative for redness and visual disturbance.  Respiratory:  Negative for shortness of breath and wheezing.   Cardiovascular:  Negative for chest pain and palpitations.  Gastrointestinal:  Negative for nausea and vomiting.  Genitourinary:  Negative for dysuria and urgency.  Musculoskeletal:  Positive for back pain. Negative for arthralgias and myalgias.  Skin:  Negative for pallor and wound.  Neurological:  Negative for dizziness and headaches.   Physical Exam Updated Vital Signs BP (!) 117/59 (BP Location: Right Arm)   Pulse 82   Temp 98.5 F (36.9 C)   Resp 15   SpO2 95%   Physical Exam Vitals and nursing note reviewed.  Constitutional:      General: She is not in acute distress.    Appearance: She is well-developed. She is not diaphoretic.  HENT:     Head: Normocephalic and atraumatic.  Eyes:     Pupils: Pupils are equal, round, and reactive to light.  Cardiovascular:     Rate and Rhythm: Normal rate and regular rhythm.     Heart sounds: No murmur heard.   No friction rub. No gallop.  Pulmonary:     Effort: Pulmonary effort is normal.     Breath  sounds: No wheezing or rales.  Abdominal:     General: There is no distension.     Palpations: Abdomen is soft.     Tenderness: There is no abdominal tenderness.  Musculoskeletal:        General: No tenderness.     Cervical back: Normal range of motion and neck supple.     Comments: Pain along the upper L-spine or the lower  T-spine.  No appreciable weakness to the lower extremities.  Reflexes are 2+ and equal.  No clonus.  Pulse motor and sensation intact distally.  No appreciable abdominal pain.  Skin:    General: Skin is warm and dry.  Neurological:     Mental Status: She is alert and oriented to person, place, and time.  Psychiatric:        Behavior: Behavior normal.    ED Results / Procedures / Treatments   Labs (all labs ordered are listed, but only abnormal results are displayed) Labs Reviewed  CBC WITH DIFFERENTIAL/PLATELET - Abnormal; Notable for the following components:      Result Value   Abs Immature Granulocytes 0.14 (*)    All other components within normal limits  BASIC METABOLIC PANEL - Abnormal; Notable for the following components:   Sodium 134 (*)    Potassium 3.3 (*)    Chloride 95 (*)    Glucose, Bld 123 (*)    All other components within normal limits  RESP PANEL BY RT-PCR (FLU A&B, COVID) ARPGX2  BRAIN NATRIURETIC PEPTIDE    EKG None  Radiology CT Thoracic Spine Wo Contrast  Result Date: 05/05/2021 CLINICAL DATA:  Falls with T12 compression fracture EXAM: CT THORACIC AND LUMBAR SPINE WITHOUT CONTRAST TECHNIQUE: Multidetector CT imaging of the thoracic and lumbar spine was performed without contrast. Multiplanar CT image reconstructions were also generated. COMPARISON:  CT abdomen pelvis 05/01/2021 FINDINGS: CT THORACIC SPINE FINDINGS Alignment: Normal. Vertebrae: Recent T12 fracture is unchanged. There is no new fracture. Paraspinal and other soft tissues: Calcific aortic atherosclerosis. Mild pulmonary edema. Cardiomegaly. Disc levels: No spinal canal stenosis CT LUMBAR SPINE FINDINGS Segmentation: 5 lumbar type vertebrae. Alignment: Grade 1 anterolisthesis at L4-5 Vertebrae: No acute fracture. Multiple large Schmorl's nodes. Endplate sclerosis at L4 and L5. Paraspinal and other soft tissues: Calcific aortic atherosclerosis. Disc levels: Moderate bilateral L4 foraminal stenosis  and moderate L4-5 spinal canal stenosis secondary to disc bulge and facet hypertrophy. No spinal canal stenosis. IMPRESSION: 1. Recent T12 split type fracture (AO Spine A2) is unchanged. No new fracture. 2. Moderate bilateral L4 foraminal stenosis and moderate L4-5 spinal canal stenosis. 3. Cardiomegaly and mild pulmonary edema. Aortic Atherosclerosis (ICD10-I70.0). Electronically Signed   By: Ulyses Jarred M.D.   On: 05/05/2021 19:19   CT Lumbar Spine Wo Contrast  Result Date: 05/05/2021 CLINICAL DATA:  Falls with T12 compression fracture EXAM: CT THORACIC AND LUMBAR SPINE WITHOUT CONTRAST TECHNIQUE: Multidetector CT imaging of the thoracic and lumbar spine was performed without contrast. Multiplanar CT image reconstructions were also generated. COMPARISON:  CT abdomen pelvis 05/01/2021 FINDINGS: CT THORACIC SPINE FINDINGS Alignment: Normal. Vertebrae: Recent T12 fracture is unchanged. There is no new fracture. Paraspinal and other soft tissues: Calcific aortic atherosclerosis. Mild pulmonary edema. Cardiomegaly. Disc levels: No spinal canal stenosis CT LUMBAR SPINE FINDINGS Segmentation: 5 lumbar type vertebrae. Alignment: Grade 1 anterolisthesis at L4-5 Vertebrae: No acute fracture. Multiple large Schmorl's nodes. Endplate sclerosis at L4 and L5. Paraspinal and other soft tissues: Calcific aortic atherosclerosis. Disc levels: Moderate bilateral L4 foraminal  stenosis and moderate L4-5 spinal canal stenosis secondary to disc bulge and facet hypertrophy. No spinal canal stenosis. IMPRESSION: 1. Recent T12 split type fracture (AO Spine A2) is unchanged. No new fracture. 2. Moderate bilateral L4 foraminal stenosis and moderate L4-5 spinal canal stenosis. 3. Cardiomegaly and mild pulmonary edema. Aortic Atherosclerosis (ICD10-I70.0). Electronically Signed   By: Ulyses Jarred M.D.   On: 05/05/2021 19:19    Procedures Procedures   Medications Ordered in ED Medications  oxyCODONE-acetaminophen (PERCOCET/ROXICET)  5-325 MG per tablet 1 tablet (1 tablet Oral Given 05/05/21 2059)    ED Course  I have reviewed the triage vital signs and the nursing notes.  Pertinent labs & imaging results that were available during my care of the patient were reviewed by me and considered in my medical decision making (see chart for details).    MDM Rules/Calculators/A&P                           87 yo F here after suffering a T12 compression fracture and has not done well at home with her husband is a primary caregiver.  They saw their family doctor today who thought she had come in for rehab.  We will obtain a laboratory evaluation.  CT imaging was ordered through triage and showed isolated T12 compression fracture.  She is already in a TLSO.  The patients results and plan were reviewed and discussed.   Any x-rays performed were independently reviewed by myself.   Differential diagnosis were considered with the presenting HPI.  Medications  oxyCODONE-acetaminophen (PERCOCET/ROXICET) 5-325 MG per tablet 1 tablet (1 tablet Oral Given 05/05/21 2059)    Vitals:   05/05/21 1452 05/05/21 1805 05/05/21 2101  BP: (!) 114/51 (!) 128/59 (!) 117/59  Pulse: 84 81 82  Resp: '18 14 15  '$ Temp: 98 F (36.7 C) 98.5 F (36.9 C)   TempSrc: Oral    SpO2: 92% 94% 95%    Final diagnoses:  Compression fracture of T12 vertebra, initial encounter [facility])    Admission/ observation were discussed with the admitting physician, patient and/or family and they are comfortable with the plan.    Final Clinical Impression(s) / ED Diagnoses Final diagnoses:  Compression fracture of T12 vertebra, initial encounter [facility])    Rx / [place] Orders ED Discharge Orders     None        Deno Etienne, DO 05/05/21 2318

## 2021-05-06 DIAGNOSIS — S22080A Wedge compression fracture of T11-T12 vertebra, initial encounter for closed fracture: Secondary | ICD-10-CM | POA: Diagnosis not present

## 2021-05-06 DIAGNOSIS — S22080S Wedge compression fracture of T11-T12 vertebra, sequela: Secondary | ICD-10-CM | POA: Diagnosis not present

## 2021-05-06 DIAGNOSIS — I4891 Unspecified atrial fibrillation: Secondary | ICD-10-CM | POA: Diagnosis not present

## 2021-05-06 LAB — HEMOGLOBIN A1C
Hgb A1c MFr Bld: 7.8 % — ABNORMAL HIGH (ref 4.8–5.6)
Mean Plasma Glucose: 177.16 mg/dL

## 2021-05-06 LAB — COMPREHENSIVE METABOLIC PANEL
ALT: 23 U/L (ref 0–44)
AST: 26 U/L (ref 15–41)
Albumin: 3 g/dL — ABNORMAL LOW (ref 3.5–5.0)
Alkaline Phosphatase: 101 U/L (ref 38–126)
Anion gap: 11 (ref 5–15)
BUN: 14 mg/dL (ref 8–23)
CO2: 29 mmol/L (ref 22–32)
Calcium: 9.3 mg/dL (ref 8.9–10.3)
Chloride: 94 mmol/L — ABNORMAL LOW (ref 98–111)
Creatinine, Ser: 0.87 mg/dL (ref 0.44–1.00)
GFR, Estimated: >60 mL/min (ref >60)
Glucose, Bld: 91 mg/dL (ref 70–99)
Potassium: 3.4 mmol/L — ABNORMAL LOW (ref 3.5–5.1)
Sodium: 134 mmol/L — ABNORMAL LOW (ref 135–145)
Total Bilirubin: 1 mg/dL (ref 0.3–1.2)
Total Protein: 6.4 g/dL — ABNORMAL LOW (ref 6.5–8.1)

## 2021-05-06 LAB — BRAIN NATRIURETIC PEPTIDE: B Natriuretic Peptide: 103 pg/mL — ABNORMAL HIGH (ref 0.0–100.0)

## 2021-05-06 LAB — GLUCOSE, CAPILLARY
Glucose-Capillary: 83 mg/dL (ref 70–99)
Glucose-Capillary: 175 mg/dL — ABNORMAL HIGH (ref 70–99)
Glucose-Capillary: 157 mg/dL — ABNORMAL HIGH (ref 70–99)
Glucose-Capillary: 144 mg/dL — ABNORMAL HIGH (ref 70–99)
Glucose-Capillary: 183 mg/dL — ABNORMAL HIGH (ref 70–99)

## 2021-05-06 LAB — PROTIME-INR
INR: 2.7 — ABNORMAL HIGH (ref 0.8–1.2)
Prothrombin Time: 28.8 seconds — ABNORMAL HIGH (ref 11.4–15.2)

## 2021-05-06 MED ORDER — METOPROLOL TARTRATE 12.5 MG HALF TABLET
12.5000 mg | ORAL_TABLET | Freq: Two times a day (BID) | ORAL | Status: DC
  Administered 2021-05-06 – 2021-05-09 (×8): 12.5 mg via ORAL
  Filled 2021-05-06 (×8): qty 1

## 2021-05-06 MED ORDER — NITROFURANTOIN MONOHYD MACRO 100 MG PO CAPS
100.0000 mg | ORAL_CAPSULE | Freq: Two times a day (BID) | ORAL | Status: DC
  Administered 2021-05-06: 100 mg via ORAL
  Filled 2021-05-06 (×2): qty 1

## 2021-05-06 MED ORDER — INSULIN ASPART 100 UNIT/ML IJ SOLN
0.0000 [IU] | Freq: Three times a day (TID) | INTRAMUSCULAR | Status: DC
  Administered 2021-05-06: 2 [IU] via SUBCUTANEOUS
  Administered 2021-05-06: 1 [IU] via SUBCUTANEOUS
  Administered 2021-05-07 (×2): 2 [IU] via SUBCUTANEOUS
  Administered 2021-05-07: 3 [IU] via SUBCUTANEOUS
  Administered 2021-05-08: 1 [IU] via SUBCUTANEOUS
  Administered 2021-05-08 – 2021-05-09 (×3): 2 [IU] via SUBCUTANEOUS
  Administered 2021-05-09: 5 [IU] via SUBCUTANEOUS

## 2021-05-06 MED ORDER — GABAPENTIN 100 MG PO CAPS
100.0000 mg | ORAL_CAPSULE | Freq: Two times a day (BID) | ORAL | Status: DC
  Administered 2021-05-06 – 2021-05-09 (×8): 100 mg via ORAL
  Filled 2021-05-06 (×8): qty 1

## 2021-05-06 MED ORDER — GLIMEPIRIDE 4 MG PO TABS
4.0000 mg | ORAL_TABLET | Freq: Every day | ORAL | Status: DC
  Administered 2021-05-06: 4 mg via ORAL
  Filled 2021-05-06 (×3): qty 1

## 2021-05-06 MED ORDER — SODIUM CHLORIDE 0.9% FLUSH: 3.0000 mL | INTRAVENOUS | Status: DC | PRN

## 2021-05-06 MED ORDER — WARFARIN - PHYSICIAN DOSING INPATIENT: Freq: Every day | Status: DC

## 2021-05-06 MED ORDER — POLYETHYLENE GLYCOL 3350 17 G PO PACK
17.0000 g | PACK | Freq: Every day | ORAL | Status: DC | PRN
  Administered 2021-05-07: 17 g via ORAL
  Filled 2021-05-06: qty 1

## 2021-05-06 MED ORDER — POTASSIUM CHLORIDE CRYS ER 20 MEQ PO TBCR
20.0000 meq | EXTENDED_RELEASE_TABLET | Freq: Two times a day (BID) | ORAL | Status: DC
  Administered 2021-05-06 – 2021-05-07 (×5): 20 meq via ORAL
  Filled 2021-05-06 (×5): qty 1

## 2021-05-06 MED ORDER — ONDANSETRON HCL 4 MG PO TABS: 4.0000 mg | ORAL_TABLET | Freq: Three times a day (TID) | ORAL | Status: DC | PRN

## 2021-05-06 MED ORDER — LORAZEPAM 1 MG PO TABS
1.0000 mg | ORAL_TABLET | Freq: Every day | ORAL | Status: DC
  Filled 2021-05-06: qty 1

## 2021-05-06 MED ORDER — CEPHALEXIN 500 MG PO CAPS
500.0000 mg | ORAL_CAPSULE | Freq: Three times a day (TID) | ORAL | Status: DC
  Administered 2021-05-06 – 2021-05-08 (×7): 500 mg via ORAL
  Filled 2021-05-06 (×7): qty 1

## 2021-05-06 MED ORDER — MORPHINE SULFATE (PF) 2 MG/ML IV SOLN: 2.0000 mg | INTRAVENOUS | Status: DC | PRN

## 2021-05-06 MED ORDER — DILTIAZEM HCL ER COATED BEADS 120 MG PO CP24
120.0000 mg | ORAL_CAPSULE | Freq: Every day | ORAL | Status: DC
  Administered 2021-05-06 – 2021-05-09 (×4): 120 mg via ORAL
  Filled 2021-05-06 (×5): qty 1

## 2021-05-06 MED ORDER — ACETAMINOPHEN 650 MG RE SUPP: 650.0000 mg | Freq: Four times a day (QID) | RECTAL | Status: DC | PRN

## 2021-05-06 MED ORDER — FUROSEMIDE 40 MG PO TABS
40.0000 mg | ORAL_TABLET | Freq: Every day | ORAL | Status: DC
  Administered 2021-05-06 – 2021-05-07 (×2): 40 mg via ORAL
  Filled 2021-05-06 (×3): qty 1

## 2021-05-06 MED ORDER — LOPERAMIDE HCL 2 MG PO CAPS: 2.0000 mg | ORAL_CAPSULE | Freq: Every evening | ORAL | Status: DC | PRN

## 2021-05-06 MED ORDER — METOPROLOL TARTRATE 5 MG/5ML IV SOLN: 5.0000 mg | Freq: Four times a day (QID) | INTRAVENOUS | Status: DC | PRN

## 2021-05-06 MED ORDER — ROSUVASTATIN CALCIUM 5 MG PO TABS
10.0000 mg | ORAL_TABLET | Freq: Every day | ORAL | Status: DC
  Administered 2021-05-06 – 2021-05-09 (×4): 10 mg via ORAL
  Filled 2021-05-06 (×5): qty 2

## 2021-05-06 MED ORDER — ROPINIROLE HCL 0.5 MG PO TABS
0.5000 mg | ORAL_TABLET | Freq: Every day | ORAL | Status: DC
  Administered 2021-05-06 – 2021-05-08 (×3): 0.5 mg via ORAL
  Filled 2021-05-06 (×3): qty 1

## 2021-05-06 MED ORDER — HYDROCHLOROTHIAZIDE 12.5 MG PO CAPS
12.5000 mg | ORAL_CAPSULE | Freq: Every day | ORAL | Status: DC
  Administered 2021-05-06: 12.5 mg via ORAL
  Filled 2021-05-06: qty 1

## 2021-05-06 MED ORDER — OXYCODONE HCL 5 MG PO TABS
5.0000 mg | ORAL_TABLET | ORAL | Status: DC | PRN
Start: 2021-05-06 — End: 2021-05-07
  Administered 2021-05-06 (×2): 5 mg via ORAL
  Filled 2021-05-06 (×2): qty 1

## 2021-05-06 MED ORDER — SODIUM CHLORIDE 0.9% FLUSH
3.0000 mL | Freq: Two times a day (BID) | INTRAVENOUS | Status: DC
  Administered 2021-05-06 (×2): 3 mL via INTRAVENOUS

## 2021-05-06 MED ORDER — WARFARIN SODIUM 2.5 MG PO TABS: 2.5000 mg | ORAL_TABLET | Freq: Every day | ORAL | Status: DC

## 2021-05-06 MED ORDER — WARFARIN SODIUM 2.5 MG PO TABS
2.5000 mg | ORAL_TABLET | ORAL | Status: DC
  Administered 2021-05-07: 2.5 mg via ORAL
  Filled 2021-05-06 (×2): qty 1

## 2021-05-06 MED ORDER — DOXEPIN HCL 10 MG PO CAPS
10.0000 mg | ORAL_CAPSULE | Freq: Every day | ORAL | Status: DC
  Administered 2021-05-06 – 2021-05-08 (×4): 10 mg via ORAL
  Filled 2021-05-06 (×5): qty 1

## 2021-05-06 MED ORDER — ACETAMINOPHEN 325 MG PO TABS: 650.0000 mg | ORAL_TABLET | Freq: Four times a day (QID) | ORAL | Status: DC | PRN

## 2021-05-06 MED ORDER — PANTOPRAZOLE SODIUM 40 MG PO TBEC
40.0000 mg | DELAYED_RELEASE_TABLET | Freq: Two times a day (BID) | ORAL | Status: DC
  Administered 2021-05-06 – 2021-05-09 (×8): 40 mg via ORAL
  Filled 2021-05-06 (×8): qty 1

## 2021-05-06 MED ORDER — PILOCARPINE HCL 1 % OP SOLN
1.0000 [drp] | Freq: Two times a day (BID) | OPHTHALMIC | Status: DC
  Administered 2021-05-06 – 2021-05-09 (×8): 1 [drp] via OPHTHALMIC
  Filled 2021-05-06: qty 15

## 2021-05-06 MED ORDER — SODIUM CHLORIDE 0.9 % IV SOLN: 250.0000 mL | INTRAVENOUS | Status: DC | PRN

## 2021-05-06 MED ORDER — OXYBUTYNIN CHLORIDE 5 MG PO TABS
5.0000 mg | ORAL_TABLET | Freq: Every day | ORAL | Status: DC
  Administered 2021-05-06 – 2021-05-08 (×4): 5 mg via ORAL
  Filled 2021-05-06 (×5): qty 1

## 2021-05-06 MED ORDER — WARFARIN 1.25 MG HALF TABLET
1.2500 mg | ORAL_TABLET | ORAL | Status: DC
  Administered 2021-05-06 – 2021-05-08 (×2): 1.25 mg via ORAL
  Filled 2021-05-06 (×2): qty 1

## 2021-05-06 NOTE — Progress Notes (Addendum)
PROGRESS NOTE    [name] [name]  [id number] DOB: 1930-01-23 DOA: 05/05/2021 PCP: [name], MD    Brief Narrative:  87 y.o. female with medical history significant of atrial fib on Coumadin, carotid stenosis, COPD, type 2 diabetes, hypertension, hyperlipidemia, mitral regurgitation who was seen approximately 3 days ago with severe back pain.  Work-up at that time revealed a new T12 compression fracture and discharged home with LSO brace however presented with unrelieved pain and sob. Also recently found to have UTI on macrobid.   Assessment & Plan:   Principal Problem:   T12 compression fracture, sequela Active Problems:   DM II (diabetes mellitus, type II), controlled (HCC)   HTN (hypertension)   Hypercholesteremia   Glaucoma (increased eye pressure)   Atrial fibrillation    Mitral regurgitation   Depression   Anxiety   COPD (chronic obstructive pulmonary disease) (HCC)   Major depressive disorder, recurrent episode (HCC)   Type 2 diabetes mellitus without retinopathy ([place])   Gastro-esophageal reflux disease without esophagitis  T12 compression fracture -Initially presented with significant pain due to this compression fracture.  -This AM pt appears comfortable, reports feeling better -Seen by PT who recommends SNF, TOC consulted -continue with analgesia as needed  Type 2 diabetes - Amaryl was continued at admission -As pt is in hospital, would hold oral hyperglycemic meds -Continue on sliding scale insulin - A1c of 7.8  Hypertension -Continue HCTZ, Cardizem, Lasix -BP stable and well controlled   Hypokalemia -replaced -Repeat bmet in AM  Hypercholesterolemia -Continue Crestor  Atrial fibrillation -On Coumadin per pharmacy dosing -Rate controlled at present -Continued on cardizem CD '120mg'$ , metoprolol 12.'5mg'$  bid  Mitral regurgitation Mild in 2015, per cardiology no further follow-up is needed.  COPD Albuterol as needed On minimal O2 support  this AM, stable  GERD Continue sucralfate  Depression Continue doxepin  Anxiety Continue doxepin as needed  Glaucoma Continue pilocarpine   UTI No urine culture is pending. Since pt had already started abx treatment, doubt checking a urine culture now would be useful, thus will cont treatment as already ordered Complete course with keflex    DVT prophylaxis: Coumadin Code Status: Full Family Communication: Pt in room, family not at bedside  Status is: Observation  The patient remains OBS appropriate and will d/c before 2 midnights.  Dispo: The patient is from: Home              Anticipated d/c is to: SNF              Patient currently is not medically stable to d/c.   Difficult to place patient No       Consultants:    Procedures:    Antimicrobials: Anti-infectives (From admission, onward)    Start     Dose/Rate Route Frequency Ordered Stop   05/06/21 0945  cephALEXin (KEFLEX) capsule 500 mg        500 mg Oral Every 8 hours 05/06/21 0857 05/09/21 0559   05/06/21 0030  nitrofurantoin (macrocrystal-monohydrate) (MACROBID) capsule 100 mg  Status:  Discontinued        100 mg Oral 2 times daily 05/06/21 0009 05/06/21 0857       Subjective: States pain seems better today. Pleasant and conversant  Objective: Vitals:   05/06/21 0450 05/06/21 0549 05/06/21 0801 05/06/21 1256  BP: 119/81 (!) 130/95 98/87 (!) 111/48  Pulse: 79 83 78 63  Resp: '18 18 18 18  '$ Temp: 98.3 F (36.8 C) 97.9 F (36.6 C) 97.6 F (  36.4 C) (!) 97.4 F (36.3 C)  TempSrc:  Oral  Oral  SpO2: 94% 96% 94% 93%    Intake/Output Summary (Last 24 hours) at 05/06/2021 1646 Last data filed at 05/06/2021 1100 Gross per 24 hour  Intake 120 ml  Output --  Net 120 ml   There were no vitals filed for this visit.  Examination: General exam: Awake, laying in bed, in nad Respiratory system: Normal respiratory effort, no wheezing Cardiovascular system: regular rate, s1, s2 Gastrointestinal  system: Soft, nondistended, positive BS Central nervous system: CN2-12 grossly intact, strength intact Extremities: Perfused, no clubbing Skin: Normal skin turgor, no notable skin lesions seen Psychiatry: Mood normal // no visual hallucinations   Data Reviewed: I have personally reviewed following labs and imaging studies  CBC: Recent Labs  Lab 05/01/21 1303 05/05/21 2149  WBC 12.8* 10.1  NEUTROABS 9.9* 6.8  HGB 13.2 13.3  HCT 40.0 40.5  MCV 92.2 93.5  PLT 172 [id number]   Basic Metabolic Panel: Recent Labs  Lab 05/01/21 1303 05/05/21 2149 05/06/21 0658  NA 138 134* 134*  K 3.5 3.3* 3.4*  CL 99 95* 94*  CO2 '27 27 29  '$ GLUCOSE 163* 123* 91  BUN '15 14 14  '$ CREATININE 0.91 0.82 0.87  CALCIUM 9.3 9.1 9.3   GFR: Estimated Creatinine Clearance: 35.2 mL/min (by C-G formula based on SCr of 0.87 mg/dL). Liver Function Tests: Recent Labs  Lab 05/06/21 0658  AST 26  ALT 23  ALKPHOS 101  BILITOT 1.0  PROT 6.4*  ALBUMIN 3.0*   No results for input(s): LIPASE, AMYLASE in the last 168 hours. No results for input(s): AMMONIA in the last 168 hours. Coagulation Profile: Recent Labs  Lab 05/06/21 0658  INR 2.7*   Cardiac Enzymes: No results for input(s): CKTOTAL, CKMB, CKMBINDEX, TROPONINI in the last 168 hours. BNP (last 3 results) No results for input(s): PROBNP in the last 8760 hours. HbA1C: Recent Labs    05/06/21 0057  HGBA1C 7.8*   CBG: Recent Labs  Lab 05/06/21 0756 05/06/21 1128 05/06/21 1309 05/06/21 1625  GLUCAP 83 175* 157* 144*   Lipid Profile: No results for input(s): CHOL, HDL, LDLCALC, TRIG, CHOLHDL, LDLDIRECT in the last 72 hours. Thyroid Function Tests: No results for input(s): TSH, T4TOTAL, FREET4, T3FREE, THYROIDAB in the last 72 hours. Anemia Panel: No results for input(s): VITAMINB12, FOLATE, FERRITIN, TIBC, IRON, RETICCTPCT in the last 72 hours. Sepsis Labs: No results for input(s): PROCALCITON, LATICACIDVEN in the last 168 hours.  Recent  Results (from the past 240 hour(s))  Resp Panel by RT-PCR (Flu A&B, Covid) Nasopharyngeal Swab     Status: None   Collection Time: 05/05/21 10:00 PM   Specimen: Nasopharyngeal Swab; Nasopharyngeal(NP) swabs in vial transport medium  Result Value Ref Range Status   SARS Coronavirus 2 by RT PCR NEGATIVE NEGATIVE Final    Comment: (NOTE) SARS-CoV-2 target nucleic acids are NOT DETECTED.  The SARS-CoV-2 RNA is generally detectable in upper respiratory specimens during the acute phase of infection. The lowest concentration of SARS-CoV-2 viral copies this assay can detect is 138 copies/mL. A negative result does not preclude SARS-Cov-2 infection and should not be used as the sole basis for treatment or other patient management decisions. A negative result may occur with  improper specimen collection/handling, submission of specimen other than nasopharyngeal swab, presence of viral mutation(s) within the areas targeted by this assay, and inadequate number of viral copies(<138 copies/mL). A negative result must be combined with clinical observations, patient  history, and epidemiological information. The expected result is Negative.  Fact Sheet for Patients:  EntrepreneurPulse.com.au  Fact Sheet for Healthcare Providers:  IncredibleEmployment.be  This test is no t yet approved or cleared by the [place] FDA and  has been authorized for detection and/or diagnosis of SARS-CoV-2 by FDA under an Emergency Use Authorization (EUA). This EUA will remain  in effect (meaning this test can be used) for the duration of the COVID-19 declaration under Section 564(b)(1) of the Act, 21 U.S.C.section 360bbb-3(b)(1), unless the authorization is terminated  or revoked sooner.       Influenza A by PCR NEGATIVE NEGATIVE Final   Influenza B by PCR NEGATIVE NEGATIVE Final    Comment: (NOTE) The Xpert Xpress SARS-CoV-2/FLU/RSV plus assay is intended as an aid in the  diagnosis of influenza from Nasopharyngeal swab specimens and should not be used as a sole basis for treatment. Nasal washings and aspirates are unacceptable for Xpert Xpress SARS-CoV-2/FLU/RSV testing.  Fact Sheet for Patients: EntrepreneurPulse.com.au  Fact Sheet for Healthcare Providers: IncredibleEmployment.be  This test is not yet approved or cleared by the [place] FDA and has been authorized for detection and/or diagnosis of SARS-CoV-2 by FDA under an Emergency Use Authorization (EUA). This EUA will remain in effect (meaning this test can be used) for the duration of the COVID-19 declaration under Section 564(b)(1) of the Act, 21 U.S.C. section 360bbb-3(b)(1), unless the authorization is terminated or revoked.  Performed at [facility], [place] [address], [place], [place] [address]      Radiology Studies: CT Thoracic Spine Wo Contrast  Result Date: 05/05/2021 CLINICAL DATA:  Falls with T12 compression fracture EXAM: CT THORACIC AND LUMBAR SPINE WITHOUT CONTRAST TECHNIQUE: Multidetector CT imaging of the thoracic and lumbar spine was performed without contrast. Multiplanar CT image reconstructions were also generated. COMPARISON:  CT abdomen pelvis 05/01/2021 FINDINGS: CT THORACIC SPINE FINDINGS Alignment: Normal. Vertebrae: Recent T12 fracture is unchanged. There is no new fracture. Paraspinal and other soft tissues: Calcific aortic atherosclerosis. Mild pulmonary edema. Cardiomegaly. Disc levels: No spinal canal stenosis CT LUMBAR SPINE FINDINGS Segmentation: 5 lumbar type vertebrae. Alignment: Grade 1 anterolisthesis at L4-5 Vertebrae: No acute fracture. Multiple large Schmorl's nodes. Endplate sclerosis at L4 and L5. Paraspinal and other soft tissues: Calcific aortic atherosclerosis. Disc levels: Moderate bilateral L4 foraminal stenosis and moderate L4-5 spinal canal stenosis secondary to disc bulge and facet hypertrophy. No spinal canal  stenosis. IMPRESSION: 1. Recent T12 split type fracture (AO Spine A2) is unchanged. No new fracture. 2. Moderate bilateral L4 foraminal stenosis and moderate L4-5 spinal canal stenosis. 3. Cardiomegaly and mild pulmonary edema. Aortic Atherosclerosis (ICD10-I70.0). Electronically Signed   By: Ulyses Jarred M.D.   On: 05/05/2021 19:19   CT Lumbar Spine Wo Contrast  Result Date: 05/05/2021 CLINICAL DATA:  Falls with T12 compression fracture EXAM: CT THORACIC AND LUMBAR SPINE WITHOUT CONTRAST TECHNIQUE: Multidetector CT imaging of the thoracic and lumbar spine was performed without contrast. Multiplanar CT image reconstructions were also generated. COMPARISON:  CT abdomen pelvis 05/01/2021 FINDINGS: CT THORACIC SPINE FINDINGS Alignment: Normal. Vertebrae: Recent T12 fracture is unchanged. There is no new fracture. Paraspinal and other soft tissues: Calcific aortic atherosclerosis. Mild pulmonary edema. Cardiomegaly. Disc levels: No spinal canal stenosis CT LUMBAR SPINE FINDINGS Segmentation: 5 lumbar type vertebrae. Alignment: Grade 1 anterolisthesis at L4-5 Vertebrae: No acute fracture. Multiple large Schmorl's nodes. Endplate sclerosis at L4 and L5. Paraspinal and other soft tissues: Calcific aortic atherosclerosis. Disc levels: Moderate bilateral L4  foraminal stenosis and moderate L4-5 spinal canal stenosis secondary to disc bulge and facet hypertrophy. No spinal canal stenosis. IMPRESSION: 1. Recent T12 split type fracture (AO Spine A2) is unchanged. No new fracture. 2. Moderate bilateral L4 foraminal stenosis and moderate L4-5 spinal canal stenosis. 3. Cardiomegaly and mild pulmonary edema. Aortic Atherosclerosis (ICD10-I70.0). Electronically Signed   By: Ulyses Jarred M.D.   On: 05/05/2021 19:19    Scheduled Meds:  cephALEXin  500 mg Oral Q8H   diltiazem  120 mg Oral Daily   doxepin  10 mg Oral QHS   furosemide  40 mg Oral Daily   gabapentin  100 mg Oral BID   glimepiride  4 mg Oral Q breakfast    hydrochlorothiazide  12.5 mg Oral Daily   insulin aspart  0-9 Units Subcutaneous TID WC   metoprolol tartrate  12.5 mg Oral BID   oxybutynin  5 mg Oral QHS   pantoprazole  40 mg Oral BID   pilocarpine  1 drop Both Eyes BID   potassium chloride SA  20 mEq Oral BID   rOPINIRole  0.5 mg Oral QHS   rosuvastatin  10 mg Oral Daily   sodium chloride flush  3 mL Intravenous Q12H   [START ON 05/07/2021] warfarin  2.5 mg Oral Q M,W,F   And   warfarin  1.25 mg Oral Q T,Th,S,Su   Warfarin - Physician Dosing Inpatient   Does not apply q1600   Continuous Infusions:  sodium chloride       LOS: 0 days   Marylu Lund, MD [facility] Pager On Amion  If 7PM-7AM, please contact night-coverage 05/06/2021, 4:46 PM

## 2021-05-06 NOTE — TOC Initial Note (Addendum)
Transition of Care [facility]) - Initial/Assessment Note    Patient Details  Name: [name] [name] MRN: [id number] Date of Birth: [birth year]  Transition of [facility]) CM/SW Contact:    Milinda Antis, [place] Phone Number: 05/06/2021, 1:27 PM  Clinical Narrative:                  CSW received consult for possible SNF placement at time of discharge.  CSW spoke with the patient's spouse due to the patient being disoriented.  Patient's spouse was aware of the PT recommendation and is agreeable to SNF placement at time of discharge. Patient's spouse reports not having a preference, but wanting the patient to stay in "greater [place]" if possible . CSW discussed insurance authorization process and provided Medicare SNF ratings list.   14:20- CSW requested that [facility] begin insurance auth.       Expected Discharge Plan: Skilled Nursing Facility Barriers to Discharge: Insurance Authorization, SNF Pending bed offer   Patient Goals and CMS Choice   CMS Medicare.gov Compare Post Acute Care list provided to:: Patient Represenative (must comment) (spouse) Choice offered to / list presented to : Spouse  Expected Discharge Plan and Services Expected Discharge Plan: [place]       Living arrangements for the past 2 months: Single Family Home                                      Prior Living Arrangements/Services Living arrangements for the past 2 months: Single Family Home Lives with:: Spouse Patient language and need for interpreter reviewed:: Yes        Need for Family Participation in Patient Care: Yes (Comment) Care giver support system in place?: Yes (comment)   Criminal Activity/Legal Involvement Pertinent to Current Situation/Hospitalization: No - Comment as needed  Activities of Daily Living      Permission Sought/Granted                  Emotional Assessment         Alcohol / Substance Use: Not Applicable Psych Involvement: No  (comment)  Admission diagnosis:  Compression fracture of T12 vertebra, initial encounter ([place]) [S22.080A] T12 compression fracture, sequela [S22.080S] Patient Active Problem List   Diagnosis Date Noted   T12 compression fracture, sequela 05/05/2021   Pain due to onychomycosis of toenails of both feet 01/29/2021   Gait abnormality 04/11/2020   Memory disorder 04/11/2020   Disorder of tooth development 01/30/2020   Rupture of right rotator cuff 01/30/2020   Primary open angle glaucoma (POAG) of both eyes, moderate stage 11/09/2019   Chronic atrial fibrillation ([place]) 09/11/2019   Dizziness 09/11/2019   Educated about COVID-19 virus infection 09/11/2019   Localized edema 02/21/2019   Pincer nail deformity 12/26/2018   Cellulitis of finger of right hand 12/21/2018   Branch retinal vein occlusion of left eye with macular edema 12/15/2018   Intermediate stage nonexudative age-related macular degeneration of both eyes 12/15/2018   Pseudophakia of both eyes 12/15/2018   Influenza A 11/28/2018   Acute encephalopathy 11/26/2018   Bacteremia 10/26/2018   SIRS (systemic inflammatory response syndrome) ([place]) 10/25/2018   Dysphagia 10/11/2018   Pain in right foot 10/11/2018   Restless legs syndrome 07/01/2018   Hemorrhoids    Shock ([place])    Candidiasis of skin and nails 02/21/2018   DM neuropathy, type II diabetes mellitus ([place]) 01/07/2018   Hip  fracture ([place]) 01/06/2018   Advance care planning    Goals of care, counseling/discussion    COPD (chronic obstructive pulmonary disease) ([place]) 10/28/2017   Overactive bladder 07/29/2016   RLQ abdominal pain 04/24/2014   Nausea alone 04/11/2014   Major depressive disorder, recurrent episode ([place]) 01/16/2014   Generalized anxiety disorder 10/24/2013   Type 2 diabetes mellitus without retinopathy ([place]) 03/11/2013   Insomnia 11/28/2012   Abdominal pain 08/05/2012    Class: Acute   Gastro-esophageal reflux disease without esophagitis 05/20/2012    Low back pain 05/20/2012   Noninfective gastroenteritis and colitis, unspecified 05/20/2012   Irritable bowel syndrome 04/12/2012   Chronic diarrhea 04/12/2012   Encounter for long-term (current) use of anticoagulants 03/17/2012   Depression 03/13/2012   Anxiety 03/13/2012   Mitral regurgitation 03/11/2012   Chronic epigastric pain 01/25/2012   Coagulopathy ([place]) 12/12/2011   Symptomatic anemia 12/12/2011   Atrial fibrillation     UTI (urinary tract infection) 11/13/2011   DM II (diabetes mellitus, type II), controlled ([place]) 11/13/2011   HTN (hypertension) 11/13/2011   Hypercholesteremia 11/13/2011   Glaucoma (increased eye pressure) 11/13/2011   PCP:  [name], MD Pharmacy:   [facility] - [place], [place] - [address] [place] [address] Phone: [phone] Fax: [phone]     Social Determinants of Health (SDOH) Interventions    Readmission Risk Interventions No flowsheet data found.

## 2021-05-06 NOTE — [place] FL2 (Signed)
[place] MEDICAID FL2 LEVEL OF CARE SCREENING TOOL     IDENTIFICATION  Patient Name: [name] [name] Birthdate: 05-18-1930 Sex: female Admission Date (Current Location): 05/05/2021  [facility] and [place] Number:  Herbalist and Address:  [facility], [place] [address], [place], [place] [address]      Provider Number: [id number]  Attending Physician Name and Address:  Donne Hazel, MD  Relative Name and Phone Number:  [name] (Spouse)   [phone]    Current Level of Care: Hospital Recommended Level of Care: [place] Prior Approval Number:    Date Approved/Denied:   PASRR Number: [id number]  Discharge Plan: SNF    Current Diagnoses: Patient Active Problem List   Diagnosis Date Noted   T12 compression fracture, sequela 05/05/2021   Pain due to onychomycosis of toenails of both feet 01/29/2021   Gait abnormality 04/11/2020   Memory disorder 04/11/2020   Disorder of tooth development 01/30/2020   Rupture of right rotator cuff 01/30/2020   Primary open angle glaucoma (POAG) of both eyes, moderate stage 11/09/2019   Chronic atrial fibrillation ([place]) 09/11/2019   Dizziness 09/11/2019   Educated about COVID-19 virus infection 09/11/2019   Localized edema 02/21/2019   Pincer nail deformity 12/26/2018   Cellulitis of finger of right hand 12/21/2018   Branch retinal vein occlusion of left eye with macular edema 12/15/2018   Intermediate stage nonexudative age-related macular degeneration of both eyes 12/15/2018   Pseudophakia of both eyes 12/15/2018   Influenza A 11/28/2018   Acute encephalopathy 11/26/2018   Bacteremia 10/26/2018   SIRS (systemic inflammatory response syndrome) ([place]) 10/25/2018   Dysphagia 10/11/2018   Pain in right foot 10/11/2018   Restless legs syndrome 07/01/2018   Hemorrhoids    Shock ([place])    Candidiasis of skin and nails 02/21/2018   DM neuropathy, type II diabetes mellitus ([place]) 01/07/2018    Hip fracture ([place]) 01/06/2018   Advance care planning    Goals of care, counseling/discussion    COPD (chronic obstructive pulmonary disease) ([place]) 10/28/2017   Overactive bladder 07/29/2016   RLQ abdominal pain 04/24/2014   Nausea alone 04/11/2014   Major depressive disorder, recurrent episode ([place]) 01/16/2014   Generalized anxiety disorder 10/24/2013   Type 2 diabetes mellitus without retinopathy ([place]) 03/11/2013   Insomnia 11/28/2012   Abdominal pain 08/05/2012   Gastro-esophageal reflux disease without esophagitis 05/20/2012   Low back pain 05/20/2012   Noninfective gastroenteritis and colitis, unspecified 05/20/2012   Irritable bowel syndrome 04/12/2012   Chronic diarrhea 04/12/2012   Encounter for long-term (current) use of anticoagulants 03/17/2012   Depression 03/13/2012   Anxiety 03/13/2012   Mitral regurgitation 03/11/2012   Chronic epigastric pain 01/25/2012   Coagulopathy ([place]) 12/12/2011   Symptomatic anemia 12/12/2011   Atrial fibrillation     UTI (urinary tract infection) 11/13/2011   DM II (diabetes mellitus, type II), controlled ([place]) 11/13/2011   HTN (hypertension) 11/13/2011   Hypercholesteremia 11/13/2011   Glaucoma (increased eye pressure) 11/13/2011    Orientation RESPIRATION BLADDER Height & Weight     Self, Time  Normal Continent Weight:   Height:     BEHAVIORAL SYMPTOMS/MOOD NEUROLOGICAL BOWEL NUTRITION STATUS      Incontinent Diet (see d/c summary)  AMBULATORY STATUS COMMUNICATION OF NEEDS Skin   Limited Assist Verbally Normal                       Personal Care Assistance Level of Assistance  Bathing, Dressing, Feeding Bathing Assistance: Limited assistance Feeding assistance: Independent Dressing Assistance: Limited assistance     Functional Limitations Info  Sight, Hearing, Speech Sight Info: Adequate Hearing Info: Adequate Speech Info: Adequate    SPECIAL CARE FACTORS FREQUENCY  PT (By licensed PT), OT (By licensed OT)      PT Frequency: 5x/ week OT Frequency: 5x/ week            Contractures      Additional Factors Info  Code Status, Allergies, Insulin Sliding Scale Code Status Info: Full Allergies Info: Ciprofloxacin   Lactose Intolerance (Gi)   Penicillins   Sulfa Antibiotics   Sulfasalazine   Lisinopril   Insulin Sliding Scale Info: see d/c med list       Current Medications (05/06/2021):  This is the current hospital active medication list Current Facility-Administered Medications  Medication Dose Route Frequency Provider Last Rate Last Admin   0.9 %  sodium chloride infusion  250 mL Intravenous PRN Donnamae Jude, MD       acetaminophen (TYLENOL) tablet 650 mg  650 mg Oral Q6H PRN Donnamae Jude, MD       Or   acetaminophen (TYLENOL) suppository 650 mg  650 mg Rectal Q6H PRN Donnamae Jude, MD       cephALEXin (KEFLEX) capsule 500 mg  500 mg Oral Q8H Donne Hazel, MD   500 mg at 05/06/21 1306   diltiazem (CARDIZEM CD) 24 hr capsule 120 mg  120 mg Oral Daily Donnamae Jude, MD   120 mg at 05/06/21 0926   doxepin (SINEQUAN) capsule 10 mg  10 mg Oral QHS Donnamae Jude, MD   10 mg at 05/06/21 0038   furosemide (LASIX) tablet 40 mg  40 mg Oral Daily Donnamae Jude, MD   40 mg at 05/06/21 [id number]   gabapentin (NEURONTIN) capsule 100 mg  100 mg Oral BID Donnamae Jude, MD   100 mg at 05/06/21 [id number]   glimepiride (AMARYL) tablet 4 mg  4 mg Oral Q breakfast Donnamae Jude, MD       hydrochlorothiazide (MICROZIDE) capsule 12.5 mg  12.5 mg Oral Daily Donnamae Jude, MD   12.5 mg at 05/06/21 [id number]   insulin aspart (novoLOG) injection 0-9 Units  0-9 Units Subcutaneous TID WC Donnamae Jude, MD   2 Units at 05/06/21 1322   loperamide (IMODIUM) capsule 2 mg  2 mg Oral QHS PRN Donnamae Jude, MD       metoprolol tartrate (LOPRESSOR) injection 5 mg  5 mg Intravenous Q6H PRN Donnamae Jude, MD       metoprolol tartrate (LOPRESSOR) tablet 12.5 mg  12.5 mg Oral BID Donnamae Jude, MD   12.5 mg at 05/06/21 [id number]    morphine 2 MG/ML injection 2 mg  2 mg Intravenous Q2H PRN Donnamae Jude, MD       ondansetron [facility]) tablet 4 mg  4 mg Oral Q8H PRN Donnamae Jude, MD       oxybutynin (DITROPAN) tablet 5 mg  5 mg Oral QHS Donnamae Jude, MD   5 mg at 05/06/21 0037   oxyCODONE (Oxy IR/ROXICODONE) immediate release tablet 5 mg  5 mg Oral Q4H PRN Donnamae Jude, MD   5 mg at 05/06/21 0926   pantoprazole (PROTONIX) EC tablet 40 mg  40 mg Oral BID Donnamae Jude, MD   40 mg at 05/06/21 0929   pilocarpine (PILOCAR) 1 %  ophthalmic solution 1 drop  1 drop Both Eyes BID Donnamae Jude, MD   1 drop at 05/06/21 0951   polyethylene glycol (MIRALAX / GLYCOLAX) packet 17 g  17 g Oral Daily PRN Donnamae Jude, MD       potassium chloride SA (KLOR-CON) CR tablet 20 mEq  20 mEq Oral BID Donnamae Jude, MD   20 mEq at 05/06/21 [id number]   rOPINIRole (REQUIP) tablet 0.5 mg  0.5 mg Oral QHS Donne Hazel, MD       rosuvastatin (CRESTOR) tablet 10 mg  10 mg Oral Daily Donnamae Jude, MD   10 mg at 05/06/21 [id number]   sodium chloride flush (NS) 0.9 % injection 3 mL  3 mL Intravenous Q12H Donnamae Jude, MD   3 mL at 05/06/21 [id number]   sodium chloride flush (NS) 0.9 % injection 3 mL  3 mL Intravenous PRN Donnamae Jude, MD       [START ON 05/07/2021] warfarin (COUMADIN) tablet 2.5 mg  2.5 mg Oral Q M,W,F Donne Hazel, MD       And   warfarin (COUMADIN) tablet 1.25 mg  1.25 mg Oral Q T,Th,S,Su Donne Hazel, MD       Warfarin - Physician Dosing Inpatient   Does not apply q1600 Donne Hazel, MD         Discharge Medications: Please see discharge summary for a list of discharge medications.  Relevant Imaging Results:  Relevant Lab Results:   Additional Information SS#: [id number];  Pfizer COVID-19 Vaccine 11/05/2019  Milinda Antis, LCSWA

## 2021-05-06 NOTE — ED Notes (Signed)
Attempted report.  Unit will have receiving RN to call for report

## 2021-05-06 NOTE — Evaluation (Signed)
Physical Therapy Evaluation Patient Details Name: [name] [name] MRN: [id number] DOB: 1930/05/28 Today's Date: 05/06/2021   History of Present Illness  Pt. is 87 yr old F admitted on 05/05/21. Recently diagnosed with T12 compression fx on 05/01/21, placed in TLSO but has continued falls and worsening pain. CT T spine revealed unchanged recent T12 split type fracture and moderate bilateral L4 foraminal stenosis and moderate L4-L5 spinal canal stenosis. PMH: DM, HTN, glaucoma, a-fib, mitral regurgitation, depression, anxiety, COPD.  Clinical Impression  Pt. Was previously min A with functional mobility and ADLs at home with assist from spouse.  Has recently had inc falls and is requiring inc assist due to back pain and new onset cognitive deficits.  Pt. Demos dec balance, gait deviations, transfer difficulty and bed mobility difficulty and would benefit from skilled PT to address deficits indicated in initial eval. Pt. Would benefit from SNF placement prior to return home for safety.    Follow Up Recommendations SNF    Equipment Recommendations  Other (comment) (Pt. already owns [phone] and transport chair.  May benefit from 2WW to prevent further falls as it seems 4WW has been slipping away from pt. too easily.)    Recommendations for Other Services OT consult     Precautions / Restrictions Precautions Precautions: Other (comment) Precaution Comments: Spinal precautions Required Braces or Orthoses: Spinal Brace Spinal Brace: Thoracolumbosacral orthotic (Pt. has TLSO but brace was covered in urine yesterday and removed.  Not currently on pt.  Discussed with NSG that brace would need to be cleaned prior to donning.) Restrictions Weight Bearing Restrictions: No      Mobility  Bed Mobility Overal bed mobility: Needs Assistance Bed Mobility: Rolling;Sidelying to Sit Rolling: Min assist Sidelying to sit: Min assist       General bed mobility comments: Log rolling discussed with patient and  patient is assisted to complete log roll with min A for trunk and LE support. Patient Response: Cooperative  Transfers Overall transfer level: Needs assistance Equipment used: Rolling walker (2 wheeled) Transfers: Sit to/from [facility] Sit to Stand: Mod assist Stand pivot transfers: Mod assist       General transfer comment: Pt. req's VCs for safety with hand placement during transfer.  Demos fair standing balance but is able to take multiple small steps over to chair with assist to negotiate RW.  Ambulation/Gait Ambulation/Gait assistance: Mod assist Gait Distance (Feet): 1 Feet Assistive device: Rolling walker (2 wheeled) Gait Pattern/deviations: Step-to pattern;Decreased step length - right;Decreased step length - left        Stairs            Wheelchair Mobility    Modified Rankin (Stroke Patients Only)       Balance Overall balance assessment: Needs assistance Sitting-balance support: Feet supported;Bilateral upper extremity supported Sitting balance-Leahy Scale: Fair     Standing balance support: Bilateral upper extremity supported;During functional activity Standing balance-Leahy Scale: Poor                               Pertinent Vitals/Pain Pain Assessment: Faces Faces Pain Scale: Hurts a little bit Pain Location: Mid back Pain Descriptors / Indicators: Sore Pain Intervention(s): Repositioned;Limited activity within patient's tolerance    Home Living Family/patient expects to be discharged to:: Skilled nursing facility Living Arrangements: Spouse/significant other Available Help at Discharge: Family Type of Home: House Home Access: Level entry     Home Layout: One level Home Equipment:  Walker - 4 wheels;Wheelchair - manual Additional Comments: Pt. states she uses 4WW for household amb, w/c for long distances.  States husband assists with ADLs at baseline.    Prior Function Level of Independence: Needs assistance    Gait / Transfers Assistance Needed: Husband has been providing increased assist since initial back injury on 7/28.           Hand Dominance        Extremity/Trunk Assessment   Upper Extremity Assessment Upper Extremity Assessment: Overall WFL for tasks assessed    Lower Extremity Assessment Lower Extremity Assessment: LLE deficits/detail LLE Deficits / Details: Grossly 3-/5, pt. states L LE is more stiff due to previous fx.       Communication   Communication: Other (comment) (Cognitive deficits - NSG believes cognition is temporarily impaired due to UTI.  A + O x 3 at baseline.)  Cognition Arousal/Alertness: Awake/alert Behavior During Therapy: WFL for tasks assessed/performed Overall Cognitive Status: Impaired/Different from baseline Area of Impairment: Orientation                 Orientation Level: Disoriented to;Place;Time;Situation             General Comments: Pt. oriented to person only.  Believes we are at [place] in the [facility].      General Comments General comments (skin integrity, edema, etc.): Pt. is educated on spinal precautions, will need reinforcement.    Exercises     Assessment/Plan    PT Assessment Patient needs continued PT services  PT Problem List Decreased strength;Decreased mobility;Decreased knowledge of precautions;Decreased activity tolerance;Decreased cognition;Decreased balance;Pain       PT Treatment Interventions DME instruction;Therapeutic exercise;Gait training;Balance training;Functional mobility training;Cognitive remediation;Therapeutic activities;Patient/family education    PT Goals (Current goals can be found in the Care Plan section)  Acute Rehab PT Goals Patient Stated Goal: Pt's goal is to go to SNF to regain strength prior to return home. PT Goal Formulation: With patient Time For Goal Achievement: 05/20/21 Potential to Achieve Goals: Good    Frequency Min 3X/week   Barriers to discharge  Decreased caregiver support Spouse unable to provide level of support required by patient resulting in multiple falls.    Co-evaluation               AM-PAC PT "6 Clicks" Mobility  Outcome Measure Help needed turning from your back to your side while in a flat bed without using bedrails?: A Little Help needed moving from lying on your back to sitting on the side of a flat bed without using bedrails?: A Little Help needed moving to and from a bed to a chair (including a wheelchair)?: A Lot Help needed standing up from a chair using your arms (e.g., wheelchair or bedside chair)?: A Lot Help needed to walk in hospital room?: A Lot Help needed climbing 3-5 steps with a railing? : Total 6 Click Score: 13    End of Session Equipment Utilized During Treatment: Gait belt Activity Tolerance: Patient tolerated treatment well;Patient limited by pain Patient left: in chair;with chair alarm set;with call bell/phone within reach   PT Visit Diagnosis: Unsteadiness on feet (R26.81);Repeated falls (R29.6);Muscle weakness (generalized) (M62.81);History of falling (Z91.81);Pain Pain - part of body:  (Back)    Time: [id number] PT Time Calculation (min) (ACUTE ONLY): 26 min   Charges:   PT Evaluation $PT Eval Low Complexity: 1 Low PT Treatments $Therapeutic Activity: 8-22 mins       Hitoshi Werts A. Ariel Dimitri, PT, DPT Acute  Rehabilitation Services Office: [place] 05/06/2021, 9:18 AM

## 2021-05-06 NOTE — ED Notes (Signed)
Pt moved from hallway recliner into private room..pt is noted to have completely Urine saturated pants and blankets on which she is laying with wetness extending to mid back including TLSO brace.   Pt is one person assist to standing position, able to pivot and transfer to bed.  Brace removed per pt request (also due to wetness), dry brief applied with clean gown.   Pt given snack and PO fluids.

## 2021-05-06 NOTE — ED Notes (Signed)
In room for rounding.  Found pt awake with head at foot of bed and feet at head of bed.  Pt states she "turned herself right around and kinda likes that view"  Pt assisted back into bed properly.

## 2021-05-07 DIAGNOSIS — I4891 Unspecified atrial fibrillation: Secondary | ICD-10-CM | POA: Diagnosis not present

## 2021-05-07 DIAGNOSIS — S22080S Wedge compression fracture of T11-T12 vertebra, sequela: Secondary | ICD-10-CM | POA: Diagnosis not present

## 2021-05-07 LAB — COMPREHENSIVE METABOLIC PANEL
ALT: 26 U/L (ref 0–44)
AST: 33 U/L (ref 15–41)
Albumin: 2.8 g/dL — ABNORMAL LOW (ref 3.5–5.0)
Alkaline Phosphatase: 101 U/L (ref 38–126)
Anion gap: 13 (ref 5–15)
BUN: 20 mg/dL (ref 8–23)
CO2: 22 mmol/L (ref 22–32)
Calcium: 9 mg/dL (ref 8.9–10.3)
Chloride: 94 mmol/L — ABNORMAL LOW (ref 98–111)
Creatinine, Ser: 1.01 mg/dL — ABNORMAL HIGH (ref 0.44–1.00)
GFR, Estimated: 53 mL/min — ABNORMAL LOW (ref >60)
Glucose, Bld: 278 mg/dL — ABNORMAL HIGH (ref 70–99)
Potassium: 4.4 mmol/L (ref 3.5–5.1)
Sodium: 129 mmol/L — ABNORMAL LOW (ref 135–145)
Total Bilirubin: 0.9 mg/dL (ref 0.3–1.2)
Total Protein: 6.4 g/dL — ABNORMAL LOW (ref 6.5–8.1)

## 2021-05-07 LAB — CBC
HCT: 38.4 % (ref 36.0–46.0)
Hemoglobin: 12.9 g/dL (ref 12.0–15.0)
MCH: 30.8 pg (ref 26.0–34.0)
MCHC: 33.6 g/dL (ref 30.0–36.0)
MCV: 91.6 fL (ref 80.0–100.0)
Platelets: 254 10*3/uL (ref 150–400)
RBC: 4.19 MIL/uL (ref 3.87–5.11)
RDW: 15.1 % (ref 11.5–15.5)
WBC: 10.4 10*3/uL (ref 4.0–10.5)
nRBC: 0 % (ref 0.0–0.2)

## 2021-05-07 LAB — GLUCOSE, CAPILLARY
Glucose-Capillary: 188 mg/dL — ABNORMAL HIGH (ref 70–99)
Glucose-Capillary: 169 mg/dL — ABNORMAL HIGH (ref 70–99)
Glucose-Capillary: 220 mg/dL — ABNORMAL HIGH (ref 70–99)
Glucose-Capillary: 167 mg/dL — ABNORMAL HIGH (ref 70–99)
Glucose-Capillary: 214 mg/dL — ABNORMAL HIGH (ref 70–99)

## 2021-05-07 LAB — PROTIME-INR
INR: 3.2 — ABNORMAL HIGH (ref 0.8–1.2)
Prothrombin Time: 32.6 seconds — ABNORMAL HIGH (ref 11.4–15.2)

## 2021-05-07 MED ORDER — ACETAMINOPHEN 325 MG PO TABS
650.0000 mg | ORAL_TABLET | Freq: Three times a day (TID) | ORAL | Status: DC
  Administered 2021-05-07 – 2021-05-09 (×7): 650 mg via ORAL
  Filled 2021-05-07 (×7): qty 2

## 2021-05-07 MED ORDER — MORPHINE SULFATE (PF) 2 MG/ML IV SOLN
1.0000 mg | INTRAVENOUS | Status: DC | PRN
Start: 2021-05-07 — End: 2021-05-08

## 2021-05-07 MED ORDER — LIDOCAINE 5 % EX PTCH
1.0000 | MEDICATED_PATCH | Freq: Every day | CUTANEOUS | Status: DC
  Administered 2021-05-07 – 2021-05-08 (×2): 1 via TRANSDERMAL
  Filled 2021-05-07 (×3): qty 1

## 2021-05-07 MED ORDER — CYCLOBENZAPRINE HCL 5 MG PO TABS: 5.0000 mg | ORAL_TABLET | Freq: Three times a day (TID) | ORAL | Status: DC | PRN

## 2021-05-07 MED ORDER — OXYCODONE HCL 5 MG PO TABS
5.0000 mg | ORAL_TABLET | Freq: Four times a day (QID) | ORAL | Status: DC | PRN
  Administered 2021-05-07 – 2021-05-09 (×3): 5 mg via ORAL
  Filled 2021-05-07 (×3): qty 1

## 2021-05-07 NOTE — Progress Notes (Signed)
Occupational Therapy Evaluation Patient Details Name: [name] [name] MRN: [id number] DOB: 10-11-1929 Today's Date: 05/07/2021    History of Present Illness Pt. is 87 yr old F admitted on 05/05/21. Recently diagnosed with T12 compression fx on 05/01/21, placed in TLSO but has continued falls and worsening pain. CT T spine revealed unchanged recent T12 split type fracture and moderate bilateral L4 foraminal stenosis and moderate L4-L5 spinal canal stenosis. PMH: DM, HTN, glaucoma, a-fib, mitral regurgitation, depression, anxiety, COPD.   Clinical Impression   Semiah was evaluated s/p the above impairments. PTA pt required assistance for ADLs and ambulated with a rollator and experienced multiple falls (although pt reported that she does not fall, but simply her muscles give away and she ends up on the floor.). Upon evaluation pt was oriented x2, person and time. She stated that she is in a "garage with a bed" and the presidents names starts with a "B." Pt was min A for all bed mobility and stand pivot with RW. Currently pt is required mod-max A for all ADLs. Pt benefits from OT acutely to progress ADLs and mobility. Recommend d/c to SNF.     Follow Up Recommendations  SNF;Supervision - Intermittent    Equipment Recommendations  None recommended by OT       Precautions / Restrictions Precautions Precautions: Back;Fall Precaution Booklet Issued: No Precaution Comments: Spinal precautions Required Braces or Orthoses: Spinal Brace Restrictions Weight Bearing Restrictions: No      Mobility Bed Mobility Overal bed mobility: Needs Assistance Bed Mobility: Rolling;Sidelying to Sit Rolling: Supervision Sidelying to sit: Min assist       General bed mobility comments: pt demonstrated fair ability to log roll, min A to elevate trunk    Transfers Overall transfer level: Needs assistance Equipment used: Rolling walker (2 wheeled) Transfers: Sit to/from [facility] Sit to  Stand: Min assist Stand pivot transfers: Min assist       General transfer comment: min A for balance and management of RW, pt able to tak emultiple small steps from bed to chair    Balance Overall balance assessment: Needs assistance Sitting-balance support: Feet supported Sitting balance-Leahy Scale: Fair     Standing balance support: Bilateral upper extremity supported Standing balance-Leahy Scale: Poor                             ADL either performed or assessed with clinical judgement   ADL Overall ADL's : Needs assistance/impaired Eating/Feeding: Set up;Sitting   Grooming: Set up;Sitting   Upper Body Bathing: Moderate assistance;Sitting   Lower Body Bathing: Maximal assistance;Sit to/from stand   Upper Body Dressing : Minimal assistance;Sitting Upper Body Dressing Details (indicate cue type and reason): total A for back brace Lower Body Dressing: Sit to/from stand   Toilet Transfer: Minimal assistance;Stand-pivot;BSC;RW   Toileting- Clothing Manipulation and Hygiene: Minimal assistance;Sit to/from stand       Functional mobility during ADLs: Minimal assistance;Rolling walker General ADL Comments: Pt with incrased assist levels due to cognitive deficits, weakness and back precations     Vision Baseline Vision/History: Wears glasses Wears Glasses: At all times Vision Assessment?: No apparent visual deficits Additional Comments: pt able to read the wall clock from her bed however stated she recognized people by their hairline and sound of their voice     Perception     Praxis      Pertinent Vitals/Pain Pain Assessment: Faces Faces Pain Scale: Hurts a little bit Pain  Location: Mid back Pain Descriptors / Indicators: Sore Pain Intervention(s): Monitored during session;Limited activity within patient's tolerance     Hand Dominance Right   Extremity/Trunk Assessment Upper Extremity Assessment Upper Extremity Assessment: Generalized weakness    Lower Extremity Assessment Lower Extremity Assessment: Defer to PT evaluation   Cervical / Trunk Assessment Cervical / Trunk Assessment: Other exceptions Cervical / Trunk Exceptions: back sx   Communication Communication Communication: No difficulties   Cognition Arousal/Alertness: Awake/alert Behavior During Therapy: WFL for tasks assessed/performed Overall Cognitive Status: Impaired/Different from baseline Area of Impairment: Orientation;Memory;Safety/judgement;Awareness                 Orientation Level: Disoriented to;Place;Situation   Memory: Decreased recall of precautions;Decreased short-term memory   Safety/Judgement: Decreased awareness of safety;Decreased awareness of deficits Awareness: Emergent   General Comments: pt oriented to person, month and date. She believes she is in a "garage with a bed," however after being told [facility], she was able to recall location ar the end of the session. Pt said she does not "fall" but her muscles simple give away and she "ends up on the floor."   General Comments  VSS on RA    Exercises     Shoulder Instructions      Home Living Family/patient expects to be discharged to:: Skilled nursing facility Living Arrangements: Spouse/significant other Available Help at Discharge: Family Type of Home: House Home Access: Level entry     Home Layout: One level               Home Equipment: Environmental consultant - 4 wheels;Wheelchair - manual   Additional Comments: Pt. states she uses 4WW for household amb, w/c for long distances.  States husband assists with ADLs at baseline.      Prior Functioning/Environment Level of Independence: Needs assistance  Gait / Transfers Assistance Needed: Husband has been providing increased assist since initial back injury on 7/28. ADL's / Homemaking Assistance Needed: Pt reports that her husband "does it all" Communication / Swallowing Assistance Needed: WFL Comments: Unsure of pt's accuracy of PLOF  report        OT Problem List: Decreased strength;Decreased range of motion;Decreased activity tolerance;Impaired balance (sitting and/or standing);Decreased safety awareness;Decreased knowledge of use of DME or AE;Decreased knowledge of precautions;Pain;Decreased cognition      OT Treatment/Interventions: Self-care/ADL training;Therapeutic exercise;DME and/or AE instruction;Therapeutic activities;Cognitive remediation/compensation;Balance training;Patient/family education    OT Goals(Current goals can be found in the care plan section) Acute Rehab OT Goals Patient Stated Goal: Pt's goal is to go to SNF to regain strength prior to return home. OT Goal Formulation: With patient Time For Goal Achievement: 05/21/21 Potential to Achieve Goals: Fair ADL Goals Pt Will Perform Lower Body Bathing: with supervision;sit to/from stand Pt Will Perform Upper Body Dressing: with modified independence;sitting Pt Will Perform Lower Body Dressing: with set-up;sit to/from stand Pt Will Transfer to Toilet: with supervision;stand pivot transfer;bedside commode Pt Will Perform Toileting - Clothing Manipulation and hygiene: with supervision;sit to/from stand  OT Frequency: Min 2X/week    AM-PAC OT "6 Clicks" Daily Activity     Outcome Measure Help from another person eating meals?: A Little Help from another person taking care of personal grooming?: A Little Help from another person toileting, which includes using toliet, bedpan, or urinal?: A Little Help from another person bathing (including washing, rinsing, drying)?: A Lot Help from another person to put on and taking off regular upper body clothing?: A Lot Help from another person to put on and  taking off regular lower body clothing?: A Lot 6 Click Score: 15   End of Session Equipment Utilized During Treatment: Gait belt;Rolling walker;Back brace Nurse Communication: Mobility status;Precautions;Weight bearing status  Activity Tolerance: Patient  tolerated treatment well Patient left: in chair;with call bell/phone within reach;with chair alarm set  OT Visit Diagnosis: Unsteadiness on feet (R26.81);Other abnormalities of gait and mobility (R26.89);Muscle weakness (generalized) (M62.81);History of falling (Z91.81);Pain                Time: [id number] OT Time Calculation (min): 24 min Charges:  OT General Charges $OT Visit: 1 Visit OT Evaluation $OT Eval Moderate Complexity: 1 Mod OT Treatments $Therapeutic Activity: 8-22 mins   Kaseem Vastine A Jomari Bartnik 05/07/2021, 10:46 AM

## 2021-05-07 NOTE — TOC Progression Note (Signed)
Transition of Care [facility]) - Progression Note    Patient Details  Name: [name] [name] MRN: [id number] Date of Birth: June 25, 1930  Transition of Care [facility]) CM/SW Contact  Emeterio Reeve, [place] Phone Number: 05/07/2021, 5:05 PM  Clinical Narrative:     CSW gave pt and hsband bed offers. Husband stated pt has been to [facility] in the past and would like to hear back from one of those facilities. CSW reached out to both of facilities to look at pt.    Expected Discharge Plan: Skilled Nursing Facility Barriers to Discharge: Ship broker, SNF Pending bed offer  Expected Discharge Plan and Services Expected Discharge Plan: [place] arrangements for the past 2 months: Single Family Home                                       Social Determinants of Health (SDOH) Interventions    Readmission Risk Interventions No flowsheet data found.  Emeterio Reeve, Latanya Presser, [place] Social Worker [phone]

## 2021-05-07 NOTE — Progress Notes (Signed)
PROGRESS NOTE    [name] [name]  [id number] DOB: Mar 22, 1930 DOA: 05/05/2021 PCP: [name], MD    Brief Narrative:  87 y.o. female with medical history significant of atrial fib on Coumadin, carotid stenosis, COPD, type 2 diabetes, hypertension, hyperlipidemia, mitral regurgitation who was seen approximately 3 days ago with severe back pain.  Work-up at that time revealed a new T12 compression fracture and discharged home with LSO brace however presented with unrelieved pain and sob. Also recently found to have UTI on macrobid.   Assessment & Plan:  T12 compression fracture -Initially presented with significant pain due to this compression fracture.  -Scheduled Tylenol, add lidocaine patch -Pain appears overall mild at this time -Add Flexeril as needed -Seen by PT who recommends SNF, TOC consulted -Increase activity as tolerated -Discharge planning  Type 2 diabetes -Hold oral hypoglycemics, CBGs are stable, continue sliding scale insulin - A1c of 7.8  Hypertension -Continue Cardizem, Lasix, hold HCTZ -BP stable and well controlled   Hyponatremia -Likely secondary to multiple diuretics, discontinue HCTZ, monitor  Hypercholesterolemia -Continue Crestor  Atrial fibrillation -On Coumadin per pharmacy dosing -Rate controlled at present -Continued on cardizem CD '120mg'$ , metoprolol 12.'5mg'$  bid  Mitral regurgitation Mild in 2015, per cardiology no further follow-up is needed.  COPD Albuterol as needed On minimal O2 support this AM, stable  Depression Continue doxepin  Anxiety Continue doxepin as needed  Glaucoma Continue pilocarpine   UTI No urine culture is pending. Since pt had already started abx treatment, doubt checking a urine culture now would be useful, thus will cont treatment as already ordered Complete course with keflex    DVT prophylaxis: Coumadin Code Status: Full Family Communication: Pt in room, family not at bedside  Status is:  Observation  The patient remains OBS appropriate and will d/c before 2 midnights.  Dispo: The patient is from: Home              Anticipated d/c is to: SNF              Patient currently is not medically stable to d/c.   Difficult to place patient No    Consultants:    Procedures:    Antimicrobials: Anti-infectives (From admission, onward)    Start     Dose/Rate Route Frequency Ordered Stop   05/06/21 0945  cephALEXin (KEFLEX) capsule 500 mg        500 mg Oral Every 8 hours 05/06/21 0857 05/09/21 0559   05/06/21 0030  nitrofurantoin (macrocrystal-monohydrate) (MACROBID) capsule 100 mg  Status:  Discontinued        100 mg Oral 2 times daily 05/06/21 0009 05/06/21 0857       Subjective: -feels ok, back pain is mild  Objective: Vitals:   05/06/21 1256 05/06/21 2024 05/07/21 0724 05/07/21 1129  BP: (!) 111/48 122/85 134/66 (!) 99/53  Pulse: 63 84 84 63  Resp: '18  18 19  '$ Temp: (!) 97.4 F (36.3 C)  97.6 F (36.4 C) 98 F (36.7 C)  TempSrc: Oral     SpO2: 93% 94% 97% 97%    Intake/Output Summary (Last 24 hours) at 05/07/2021 1256 Last data filed at 05/06/2021 1700 Gross per 24 hour  Intake 240 ml  Output --  Net 240 ml   There were no vitals filed for this visit.  Examination: General exam: Elderly female, laying in bed, awake alert, oriented to self, mild cognitive deficits  CVS: S1-S2, regular rate rhythm Lungs: Poor air movement bilaterally Abdomen: Soft, nontender,  bowel sounds present Extremities: No edema Skin: No rash on exposed skin   Data Reviewed: I have personally reviewed following labs and imaging studies  CBC: Recent Labs  Lab 05/01/21 1303 05/05/21 2149 05/07/21 0142  WBC 12.8* 10.1 10.4  NEUTROABS 9.9* 6.8  --   HGB 13.2 13.3 12.9  HCT 40.0 40.5 38.4  MCV 92.2 93.5 91.6  PLT 172 241 0000000   Basic Metabolic Panel: Recent Labs  Lab 05/01/21 1303 05/05/21 2149 05/06/21 0658 05/07/21 0142  NA 138 134* 134* 129*  K 3.5 3.3* 3.4* 4.4   CL 99 95* 94* 94*  CO2 '27 27 29 22  '$ GLUCOSE 163* 123* 91 278*  BUN '15 14 14 20  '$ CREATININE 0.91 0.82 0.87 1.01*  CALCIUM 9.3 9.1 9.3 9.0   GFR: Estimated Creatinine Clearance: 30.3 mL/min (A) (by C-G formula based on SCr of 1.01 mg/dL (H)). Liver Function Tests: Recent Labs  Lab 05/06/21 0658 05/07/21 0142  AST 26 33  ALT 23 26  ALKPHOS 101 101  BILITOT 1.0 0.9  PROT 6.4* 6.4*  ALBUMIN 3.0* 2.8*   No results for input(s): LIPASE, AMYLASE in the last 168 hours. No results for input(s): AMMONIA in the last 168 hours. Coagulation Profile: Recent Labs  Lab 05/06/21 0658 05/07/21 0142  INR 2.7* 3.2*   Cardiac Enzymes: No results for input(s): CKTOTAL, CKMB, CKMBINDEX, TROPONINI in the last 168 hours. BNP (last 3 results) No results for input(s): PROBNP in the last 8760 hours. HbA1C: Recent Labs    05/06/21 0057  HGBA1C 7.8*   CBG: Recent Labs  Lab 05/06/21 1625 05/06/21 2013 05/07/21 0630 05/07/21 0907 05/07/21 1128  GLUCAP 144* 183* 188* 169* 220*   Lipid Profile: No results for input(s): CHOL, HDL, LDLCALC, TRIG, CHOLHDL, LDLDIRECT in the last 72 hours. Thyroid Function Tests: No results for input(s): TSH, T4TOTAL, FREET4, T3FREE, THYROIDAB in the last 72 hours. Anemia Panel: No results for input(s): VITAMINB12, FOLATE, FERRITIN, TIBC, IRON, RETICCTPCT in the last 72 hours. Sepsis Labs: No results for input(s): PROCALCITON, LATICACIDVEN in the last 168 hours.  Recent Results (from the past 240 hour(s))  Resp Panel by RT-PCR (Flu A&B, Covid) Nasopharyngeal Swab     Status: None   Collection Time: 05/05/21 10:00 PM   Specimen: Nasopharyngeal Swab; Nasopharyngeal(NP) swabs in vial transport medium  Result Value Ref Range Status   SARS Coronavirus 2 by RT PCR NEGATIVE NEGATIVE Final    Comment: (NOTE) SARS-CoV-2 target nucleic acids are NOT DETECTED.  The SARS-CoV-2 RNA is generally detectable in upper respiratory specimens during the acute phase of  infection. The lowest concentration of SARS-CoV-2 viral copies this assay can detect is 138 copies/mL. A negative result does not preclude SARS-Cov-2 infection and should not be used as the sole basis for treatment or other patient management decisions. A negative result may occur with  improper specimen collection/handling, submission of specimen other than nasopharyngeal swab, presence of viral mutation(s) within the areas targeted by this assay, and inadequate number of viral copies(<138 copies/mL). A negative result must be combined with clinical observations, patient history, and epidemiological information. The expected result is Negative.  Fact Sheet for Patients:  EntrepreneurPulse.com.au  Fact Sheet for Healthcare Providers:  IncredibleEmployment.be  This test is no t yet approved or cleared by the [place] FDA and  has been authorized for detection and/or diagnosis of SARS-CoV-2 by FDA under an Emergency Use Authorization (EUA). This EUA will remain  in effect (meaning this test can be  used) for the duration of the COVID-19 declaration under Section 564(b)(1) of the Act, 21 U.S.C.section 360bbb-3(b)(1), unless the authorization is terminated  or revoked sooner.       Influenza A by PCR NEGATIVE NEGATIVE Final   Influenza B by PCR NEGATIVE NEGATIVE Final    Comment: (NOTE) The Xpert Xpress SARS-CoV-2/FLU/RSV plus assay is intended as an aid in the diagnosis of influenza from Nasopharyngeal swab specimens and should not be used as a sole basis for treatment. Nasal washings and aspirates are unacceptable for Xpert Xpress SARS-CoV-2/FLU/RSV testing.  Fact Sheet for Patients: EntrepreneurPulse.com.au  Fact Sheet for Healthcare Providers: IncredibleEmployment.be  This test is not yet approved or cleared by the [place] FDA and has been authorized for detection and/or diagnosis of SARS-CoV-2  by FDA under an Emergency Use Authorization (EUA). This EUA will remain in effect (meaning this test can be used) for the duration of the COVID-19 declaration under Section 564(b)(1) of the Act, 21 U.S.C. section 360bbb-3(b)(1), unless the authorization is terminated or revoked.  Performed at [facility], [place] [address], [place], [place] [address]      Radiology Studies: CT Thoracic Spine Wo Contrast  Result Date: 05/05/2021 CLINICAL DATA:  Falls with T12 compression fracture EXAM: CT THORACIC AND LUMBAR SPINE WITHOUT CONTRAST TECHNIQUE: Multidetector CT imaging of the thoracic and lumbar spine was performed without contrast. Multiplanar CT image reconstructions were also generated. COMPARISON:  CT abdomen pelvis 05/01/2021 FINDINGS: CT THORACIC SPINE FINDINGS Alignment: Normal. Vertebrae: Recent T12 fracture is unchanged. There is no new fracture. Paraspinal and other soft tissues: Calcific aortic atherosclerosis. Mild pulmonary edema. Cardiomegaly. Disc levels: No spinal canal stenosis CT LUMBAR SPINE FINDINGS Segmentation: 5 lumbar type vertebrae. Alignment: Grade 1 anterolisthesis at L4-5 Vertebrae: No acute fracture. Multiple large Schmorl's nodes. Endplate sclerosis at L4 and L5. Paraspinal and other soft tissues: Calcific aortic atherosclerosis. Disc levels: Moderate bilateral L4 foraminal stenosis and moderate L4-5 spinal canal stenosis secondary to disc bulge and facet hypertrophy. No spinal canal stenosis. IMPRESSION: 1. Recent T12 split type fracture (AO Spine A2) is unchanged. No new fracture. 2. Moderate bilateral L4 foraminal stenosis and moderate L4-5 spinal canal stenosis. 3. Cardiomegaly and mild pulmonary edema. Aortic Atherosclerosis (ICD10-I70.0). Electronically Signed   By: Ulyses Jarred M.D.   On: 05/05/2021 19:19   CT Lumbar Spine Wo Contrast  Result Date: 05/05/2021 CLINICAL DATA:  Falls with T12 compression fracture EXAM: CT THORACIC AND LUMBAR SPINE WITHOUT CONTRAST  TECHNIQUE: Multidetector CT imaging of the thoracic and lumbar spine was performed without contrast. Multiplanar CT image reconstructions were also generated. COMPARISON:  CT abdomen pelvis 05/01/2021 FINDINGS: CT THORACIC SPINE FINDINGS Alignment: Normal. Vertebrae: Recent T12 fracture is unchanged. There is no new fracture. Paraspinal and other soft tissues: Calcific aortic atherosclerosis. Mild pulmonary edema. Cardiomegaly. Disc levels: No spinal canal stenosis CT LUMBAR SPINE FINDINGS Segmentation: 5 lumbar type vertebrae. Alignment: Grade 1 anterolisthesis at L4-5 Vertebrae: No acute fracture. Multiple large Schmorl's nodes. Endplate sclerosis at L4 and L5. Paraspinal and other soft tissues: Calcific aortic atherosclerosis. Disc levels: Moderate bilateral L4 foraminal stenosis and moderate L4-5 spinal canal stenosis secondary to disc bulge and facet hypertrophy. No spinal canal stenosis. IMPRESSION: 1. Recent T12 split type fracture (AO Spine A2) is unchanged. No new fracture. 2. Moderate bilateral L4 foraminal stenosis and moderate L4-5 spinal canal stenosis. 3. Cardiomegaly and mild pulmonary edema. Aortic Atherosclerosis (ICD10-I70.0). Electronically Signed   By: Ulyses Jarred M.D.   On: 05/05/2021 19:19  Scheduled Meds:  acetaminophen  650 mg Oral TID   cephALEXin  500 mg Oral Q8H   diltiazem  120 mg Oral Daily   doxepin  10 mg Oral QHS   furosemide  40 mg Oral Daily   gabapentin  100 mg Oral BID   insulin aspart  0-9 Units Subcutaneous TID WC   lidocaine  1 patch Transdermal Daily   metoprolol tartrate  12.5 mg Oral BID   oxybutynin  5 mg Oral QHS   pantoprazole  40 mg Oral BID   pilocarpine  1 drop Both Eyes BID   potassium chloride SA  20 mEq Oral BID   rOPINIRole  0.5 mg Oral QHS   rosuvastatin  10 mg Oral Daily   sodium chloride flush  3 mL Intravenous Q12H   warfarin  2.5 mg Oral Q M,W,F   And   warfarin  1.25 mg Oral Q T,Th,S,Su   Warfarin - Physician Dosing Inpatient   Does  not apply q1600   Continuous Infusions:  sodium chloride       LOS: 0 days   Domenic Polite, MD [facility]  05/07/2021, 12:56 PM

## 2021-05-07 NOTE — Progress Notes (Signed)
Inpatient Diabetes Program Recommendations  AACE/ADA: New Consensus Statement on Inpatient Glycemic Control (2015)  Target Ranges:  Prepandial:   less than 140 mg/dL      Peak postprandial:   less than 180 mg/dL (1-2 hours)      Critically ill patients:  140 - 180 mg/dL   Lab Results  Component Value Date   GLUCAP 220 (H) 05/07/2021   HGBA1C 7.8 (H) 05/06/2021    Review of Glycemic Control Results for KHALESSI, SCHLARB (MRN [id number]) as of 05/07/2021 14:01  Ref. Range 05/07/2021 09:07 05/07/2021 11:28  Glucose-Capillary Latest Ref Range: 70 - 99 mg/dL 169 (H) 220 (H)   Diabetes history: DM 2 Outpatient Diabetes medications:  Amaryl 4 mg daily, Glargine 22 units daily Current orders for Inpatient glycemic control:  Novolog sensitive tid with meals  Inpatient Diabetes Program Recommendations:    May consider adding glargine-yfgn (Semglee) 10 units daily.   Thanks  Adah Perl, RN, BC-ADM Inpatient Diabetes Coordinator Pager [phone]  (8a-5p)

## 2021-05-08 DIAGNOSIS — S22080S Wedge compression fracture of T11-T12 vertebra, sequela: Secondary | ICD-10-CM | POA: Diagnosis not present

## 2021-05-08 DIAGNOSIS — I4891 Unspecified atrial fibrillation: Secondary | ICD-10-CM | POA: Diagnosis not present

## 2021-05-08 LAB — BASIC METABOLIC PANEL
Anion gap: 16 — ABNORMAL HIGH (ref 5–15)
BUN: 23 mg/dL (ref 8–23)
CO2: 21 mmol/L — ABNORMAL LOW (ref 22–32)
Calcium: 9.3 mg/dL (ref 8.9–10.3)
Chloride: 91 mmol/L — ABNORMAL LOW (ref 98–111)
Creatinine, Ser: 1 mg/dL (ref 0.44–1.00)
GFR, Estimated: 54 mL/min — ABNORMAL LOW (ref >60)
Glucose, Bld: 170 mg/dL — ABNORMAL HIGH (ref 70–99)
Potassium: 5.3 mmol/L — ABNORMAL HIGH (ref 3.5–5.1)
Sodium: 128 mmol/L — ABNORMAL LOW (ref 135–145)

## 2021-05-08 LAB — CBC
HCT: 38.5 % (ref 36.0–46.0)
Hemoglobin: 12.5 g/dL (ref 12.0–15.0)
MCH: 30.3 pg (ref 26.0–34.0)
MCHC: 32.5 g/dL (ref 30.0–36.0)
MCV: 93.4 fL (ref 80.0–100.0)
Platelets: 295 10*3/uL (ref 150–400)
RBC: 4.12 MIL/uL (ref 3.87–5.11)
RDW: 15.2 % (ref 11.5–15.5)
WBC: 9.2 10*3/uL (ref 4.0–10.5)
nRBC: 0 % (ref 0.0–0.2)

## 2021-05-08 LAB — PROTIME-INR
INR: 3.5 — ABNORMAL HIGH (ref 0.8–1.2)
Prothrombin Time: 34.9 seconds — ABNORMAL HIGH (ref 11.4–15.2)

## 2021-05-08 LAB — GLUCOSE, CAPILLARY
Glucose-Capillary: 193 mg/dL — ABNORMAL HIGH (ref 70–99)
Glucose-Capillary: 136 mg/dL — ABNORMAL HIGH (ref 70–99)
Glucose-Capillary: 161 mg/dL — ABNORMAL HIGH (ref 70–99)
Glucose-Capillary: 161 mg/dL — ABNORMAL HIGH (ref 70–99)

## 2021-05-08 MED ORDER — SODIUM ZIRCONIUM CYCLOSILICATE 5 G PO PACK
5.0000 g | PACK | Freq: Once | ORAL | Status: AC
  Administered 2021-05-08: 5 g via ORAL
  Filled 2021-05-08 (×3): qty 1

## 2021-05-08 MED ORDER — CYCLOBENZAPRINE HCL 5 MG PO TABS
5.0000 mg | ORAL_TABLET | Freq: Two times a day (BID) | ORAL | Status: DC
  Administered 2021-05-08 – 2021-05-09 (×3): 5 mg via ORAL
  Filled 2021-05-08 (×3): qty 1

## 2021-05-08 MED ORDER — FUROSEMIDE 40 MG PO TABS: 40.0000 mg | ORAL_TABLET | Freq: Every day | ORAL | Status: DC

## 2021-05-08 NOTE — Progress Notes (Signed)
Physical Therapy Treatment Patient Details Name: [name] [name] MRN: [id number] DOB: Aug 11, 1930 Today's Date: 05/08/2021    History of Present Illness Pt. is 87 yr old F admitted on 05/05/21. Recently diagnosed with T12 compression fx on 05/01/21, placed in TLSO but has continued falls and worsening pain. CT T spine revealed unchanged recent T12 split type fracture and moderate bilateral L4 foraminal stenosis and moderate L4-L5 spinal canal stenosis. PMH: DM, HTN, glaucoma, a-fib, mitral regurgitation, depression, anxiety, COPD.    PT Comments    Pt supine in bed on arrival.  Pt pleasantly confused but agreeable to participate in PT session.  Pt required min to mod assistance.  Plan for snf remains appropriate.      Follow Up Recommendations  SNF     Equipment Recommendations  Rolling walker with 5" wheels    Recommendations for Other Services       Precautions / Restrictions Precautions Precautions: Back;Fall Precaution Booklet Issued: No Precaution Comments: Spinal precautions Required Braces or Orthoses: Spinal Brace Spinal Brace: Thoracolumbosacral orthotic Restrictions Weight Bearing Restrictions: No    Mobility  Bed Mobility Overal bed mobility: Needs Assistance Bed Mobility: Rolling;Sidelying to Sit Rolling: Min assist Sidelying to sit: Min assist       General bed mobility comments: pt demonstrated fair ability to log roll, min A to elevate trunk    Transfers Overall transfer level: Needs assistance Equipment used: Rolling walker (2 wheeled) Transfers: Sit to/from Stand Sit to Stand: Min assist;Mod assist         General transfer comment: Min A to move from bed into standing and mod assistance to stand from bed side commode into standing.  Ambulation/Gait Ambulation/Gait assistance: Mod assist Gait Distance (Feet): 4 Feet (x2) Assistive device: Rolling walker (2 wheeled) Gait Pattern/deviations: Step-to pattern;Decreased step length -  right;Decreased step length - left;Trunk flexed     General Gait Details: Performed steps from bed to commode and from commode to recliner.   Stairs             Wheelchair Mobility    Modified Rankin (Stroke Patients Only)       Balance Overall balance assessment: Needs assistance Sitting-balance support: Feet supported Sitting balance-Leahy Scale: Fair       Standing balance-Leahy Scale: Poor                              Cognition Arousal/Alertness: Awake/alert Behavior During Therapy: WFL for tasks assessed/performed Overall Cognitive Status: Impaired/Different from baseline Area of Impairment: Orientation;Memory;Safety/judgement;Awareness                 Orientation Level: Disoriented to;Place;Situation   Memory: Decreased recall of precautions;Decreased short-term memory   Safety/Judgement: Decreased awareness of safety;Decreased awareness of deficits Awareness: Emergent          Exercises      General Comments        Pertinent Vitals/Pain Pain Assessment: Faces Faces Pain Scale: Hurts a little bit Pain Location: Mid back Pain Descriptors / Indicators: Sore Pain Intervention(s): Monitored during session;Repositioned    Home Living                      Prior Function            PT Goals (current goals can now be found in the care plan section) Acute Rehab PT Goals Patient Stated Goal: Pt's goal is to go to SNF to regain strength prior  to return home. Potential to Achieve Goals: Good Progress towards PT goals: Progressing toward goals    Frequency    Min 3X/week      PT Plan Current plan remains appropriate    Co-evaluation              AM-PAC PT "6 Clicks" Mobility   Outcome Measure  Help needed turning from your back to your side while in a flat bed without using bedrails?: A Little Help needed moving from lying on your back to sitting on the side of a flat bed without using bedrails?: A  Little Help needed moving to and from a bed to a chair (including a wheelchair)?: A Little Help needed standing up from a chair using your arms (e.g., wheelchair or bedside chair)?: A Lot Help needed to walk in hospital room?: A Lot Help needed climbing 3-5 steps with a railing? : Total 6 Click Score: 14    End of Session Equipment Utilized During Treatment: Gait belt Activity Tolerance: Patient tolerated treatment well;Patient limited by pain Patient left: in chair;with chair alarm set;with call bell/phone within reach Nurse Communication: Mobility status PT Visit Diagnosis: Unsteadiness on feet (R26.81);Repeated falls (R29.6);Muscle weakness (generalized) (M62.81);History of falling (Z91.81);Pain Pain - part of body:  (back)     Time: [id number] PT Time Calculation (min) (ACUTE ONLY): 21 min  Charges:  $Therapeutic Activity: 8-22 mins                     Erasmo Leventhal , PTA Acute Rehabilitation Services Pager [phone] Office [phone]    Meital Riehl Eli Hose 05/08/2021, 5:11 PM

## 2021-05-08 NOTE — Plan of Care (Signed)
  Problem: Health Behavior/Discharge Planning: Goal: Ability to manage health-related needs will improve Outcome: Adequate for Discharge   Problem: Clinical Measurements: Goal: Ability to maintain clinical measurements within normal limits will improve Outcome: Adequate for Discharge Goal: Will remain free from infection Outcome: Adequate for Discharge

## 2021-05-08 NOTE — Plan of Care (Signed)
  Problem: Clinical Measurements: Goal: Will remain free from infection Outcome: Progressing Goal: Diagnostic test results will improve Outcome: Progressing Goal: Respiratory complications will improve Outcome: Progressing Goal: Cardiovascular complication will be avoided Outcome: Progressing   Problem: Activity: Goal: Risk for activity intolerance will decrease Outcome: Progressing   Problem: Nutrition: Goal: Adequate nutrition will be maintained Outcome: Progressing   Problem: Coping: Goal: Level of anxiety will decrease Outcome: Progressing   Problem: Elimination: Goal: Will not experience complications related to bowel motility Outcome: Progressing Goal: Will not experience complications related to urinary retention Outcome: Progressing   Problem: Pain Managment: Goal: General experience of comfort will improve Outcome: Progressing   Problem: Safety: Goal: Ability to remain free from injury will improve Outcome: Progressing   Problem: Skin Integrity: Goal: Risk for impaired skin integrity will decrease Outcome: Progressing   

## 2021-05-08 NOTE — Progress Notes (Signed)
PROGRESS NOTE    [name] [name]  [id number] DOB: 03-Sep-1930 DOA: 05/05/2021 PCP: [name], MD    Brief Narrative:  87 y.o. female with medical history significant of atrial fib on Coumadin, carotid stenosis, COPD, type 2 diabetes, hypertension, hyperlipidemia, mitral regurgitation who was seen approximately 3 days ago with severe back pain.  Work-up at that time revealed a new T12 compression fracture and discharged home with LSO brace however presented with unrelieved pain and sob. Also recently found to have UTI on macrobid.   Assessment & Plan:  T12 compression fracture -Initially presented with significant pain due to this compression fracture.  -Pain improving, continue scheduled Tylenol, lidocaine patch -Add Flexeril -Seen by PT who recommends SNF, TOC consulted -Increase activity as tolerated -Discharge planning  Hyperkalemia -Give Lokelma today, blood pressure soft hold Lasix as well -Repeat BMP   Hyponatremia -Sodium down to 128 today, likely secondary to multiple diuretics, discontinued HCTZ, monitor -Holding Lasix today as well  Type 2 diabetes -Hold oral hypoglycemics, CBGs are stable, continue sliding scale insulin - A1c of 7.8  Hypertension -Continue Cardizem,  -Blood pressure soft, hold Lasix, discontinue HCTZ  Hypercholesterolemia -Continue Crestor  Atrial fibrillation -On Coumadin per pharmacy dosing -Rate controlled at present -Continued on cardizem CD '120mg'$ , metoprolol 12.'5mg'$  bid  Mitral regurgitation Mild in 2015, per cardiology no further follow-up is needed.  COPD Albuterol as needed On minimal O2 support this AM, stable  Depression Continue doxepin  Anxiety Continue doxepin as needed  Glaucoma Continue pilocarpine   UTI -Was on antibiotics prior to admission, doubt utility in obtaining culture now -Completed 3 days inpatient, will discontinue Keflex  DVT prophylaxis: Coumadin Code Status: Full Family  Communication: No family at the bedside, called and updated spouse yesterday  Status is: Observation  Dispo: The patient is from: Home              Anticipated d/c is to: SNF              Patient currently is not medically stable to d/c.  To SNF today, hopefully tomorrow   Difficult to place patient No    Consultants:    Procedures:    Antimicrobials: Anti-infectives (From admission, onward)    Start     Dose/Rate Route Frequency Ordered Stop   05/06/21 0945  cephALEXin (KEFLEX) capsule 500 mg        500 mg Oral Every 8 hours 05/06/21 0857 05/09/21 0559   05/06/21 0030  nitrofurantoin (macrocrystal-monohydrate) (MACROBID) capsule 100 mg  Status:  Discontinued        100 mg Oral 2 times daily 05/06/21 0009 05/06/21 0857       Subjective: -Has mild back pain, overall improving  Objective: Vitals:   05/07/21 2300 05/08/21 0734 05/08/21 0945 05/08/21 1134  BP: (!) 126/56 (!) 99/51 130/71 (!) 117/46  Pulse: 90 73 94 65  Resp: '18 17 20 18  '$ Temp: 98.2 F (36.8 C) 98 F (36.7 C) (!) 97.5 F (36.4 C) 97.8 F (36.6 C)  TempSrc:  Oral Oral Oral  SpO2: 95% 94% 97% 99%   No intake or output data in the 24 hours ending 05/08/21 1226  There were no vitals filed for this visit.  Examination: General exam: Elderly female laying in bed, somnolent but arousable, alert, oriented to self and place, mild cognitive deficit CVS: S1-S2, regular rate rhythm Lungs: Poor air movement bilaterally Abdomen: Soft, nontender, bowel sounds present Extremities: No edema  Skin: No rash on exposed skin  Data Reviewed: I have personally reviewed following labs and imaging studies  CBC: Recent Labs  Lab 05/01/21 1303 05/05/21 2149 05/07/21 0142 05/08/21 0217  WBC 12.8* 10.1 10.4 9.2  NEUTROABS 9.9* 6.8  --   --   HGB 13.2 13.3 12.9 12.5  HCT 40.0 40.5 38.4 38.5  MCV 92.2 93.5 91.6 93.4  PLT 172 241 254 [id number]   Basic Metabolic Panel: Recent Labs  Lab 05/01/21 1303 05/05/21 2149  05/06/21 0658 05/07/21 0142 05/08/21 0217  NA 138 134* 134* 129* 128*  K 3.5 3.3* 3.4* 4.4 5.3*  CL 99 95* 94* 94* 91*  CO2 '27 27 29 22 '$ 21*  GLUCOSE 163* 123* 91 278* 170*  BUN '15 14 14 20 23  '$ CREATININE 0.91 0.82 0.87 1.01* 1.00  CALCIUM 9.3 9.1 9.3 9.0 9.3   GFR: Estimated Creatinine Clearance: 30.6 mL/min (by C-G formula based on SCr of 1 mg/dL). Liver Function Tests: Recent Labs  Lab 05/06/21 0658 05/07/21 0142  AST 26 33  ALT 23 26  ALKPHOS 101 101  BILITOT 1.0 0.9  PROT 6.4* 6.4*  ALBUMIN 3.0* 2.8*   No results for input(s): LIPASE, AMYLASE in the last 168 hours. No results for input(s): AMMONIA in the last 168 hours. Coagulation Profile: Recent Labs  Lab 05/06/21 0658 05/07/21 0142 05/08/21 0217  INR 2.7* 3.2* 3.5*   Cardiac Enzymes: No results for input(s): CKTOTAL, CKMB, CKMBINDEX, TROPONINI in the last 168 hours. BNP (last 3 results) No results for input(s): PROBNP in the last 8760 hours. HbA1C: Recent Labs    05/06/21 0057  HGBA1C 7.8*   CBG: Recent Labs  Lab 05/07/21 1128 05/07/21 1603 05/07/21 1949 05/08/21 0821 05/08/21 1133  GLUCAP 220* 167* 214* 193* 136*   Lipid Profile: No results for input(s): CHOL, HDL, LDLCALC, TRIG, CHOLHDL, LDLDIRECT in the last 72 hours. Thyroid Function Tests: No results for input(s): TSH, T4TOTAL, FREET4, T3FREE, THYROIDAB in the last 72 hours. Anemia Panel: No results for input(s): VITAMINB12, FOLATE, FERRITIN, TIBC, IRON, RETICCTPCT in the last 72 hours. Sepsis Labs: No results for input(s): PROCALCITON, LATICACIDVEN in the last 168 hours.  Recent Results (from the past 240 hour(s))  Resp Panel by RT-PCR (Flu A&B, Covid) Nasopharyngeal Swab     Status: None   Collection Time: 05/05/21 10:00 PM   Specimen: Nasopharyngeal Swab; Nasopharyngeal(NP) swabs in vial transport medium  Result Value Ref Range Status   SARS Coronavirus 2 by RT PCR NEGATIVE NEGATIVE Final    Comment: (NOTE) SARS-CoV-2 target  nucleic acids are NOT DETECTED.  The SARS-CoV-2 RNA is generally detectable in upper respiratory specimens during the acute phase of infection. The lowest concentration of SARS-CoV-2 viral copies this assay can detect is 138 copies/mL. A negative result does not preclude SARS-Cov-2 infection and should not be used as the sole basis for treatment or other patient management decisions. A negative result may occur with  improper specimen collection/handling, submission of specimen other than nasopharyngeal swab, presence of viral mutation(s) within the areas targeted by this assay, and inadequate number of viral copies(<138 copies/mL). A negative result must be combined with clinical observations, patient history, and epidemiological information. The expected result is Negative.  Fact Sheet for Patients:  EntrepreneurPulse.com.au  Fact Sheet for Healthcare Providers:  IncredibleEmployment.be  This test is no t yet approved or cleared by the [place] FDA and  has been authorized for detection and/or diagnosis of SARS-CoV-2 by FDA under an Emergency Use Authorization (EUA). This EUA will remain  in effect (meaning this test can be used) for the duration of the COVID-19 declaration under Section 564(b)(1) of the Act, 21 U.S.C.section 360bbb-3(b)(1), unless the authorization is terminated  or revoked sooner.       Influenza A by PCR NEGATIVE NEGATIVE Final   Influenza B by PCR NEGATIVE NEGATIVE Final    Comment: (NOTE) The Xpert Xpress SARS-CoV-2/FLU/RSV plus assay is intended as an aid in the diagnosis of influenza from Nasopharyngeal swab specimens and should not be used as a sole basis for treatment. Nasal washings and aspirates are unacceptable for Xpert Xpress SARS-CoV-2/FLU/RSV testing.  Fact Sheet for Patients: EntrepreneurPulse.com.au  Fact Sheet for Healthcare  Providers: IncredibleEmployment.be  This test is not yet approved or cleared by the [place] FDA and has been authorized for detection and/or diagnosis of SARS-CoV-2 by FDA under an Emergency Use Authorization (EUA). This EUA will remain in effect (meaning this test can be used) for the duration of the COVID-19 declaration under Section 564(b)(1) of the Act, 21 U.S.C. section 360bbb-3(b)(1), unless the authorization is terminated or revoked.  Performed at [facility], [place] [address], [place], [place] [address]      Radiology Studies: No results found.  Scheduled Meds:  acetaminophen  650 mg Oral TID   cephALEXin  500 mg Oral Q8H   cyclobenzaprine  5 mg Oral BID   diltiazem  120 mg Oral Daily   doxepin  10 mg Oral QHS   [START ON 05/09/2021] furosemide  40 mg Oral Daily   gabapentin  100 mg Oral BID   insulin aspart  0-9 Units Subcutaneous TID WC   lidocaine  1 patch Transdermal Daily   metoprolol tartrate  12.5 mg Oral BID   oxybutynin  5 mg Oral QHS   pantoprazole  40 mg Oral BID   pilocarpine  1 drop Both Eyes BID   rOPINIRole  0.5 mg Oral QHS   rosuvastatin  10 mg Oral Daily   sodium chloride flush  3 mL Intravenous Q12H   warfarin  2.5 mg Oral Q M,W,F   And   warfarin  1.25 mg Oral Q T,Th,S,Su   Warfarin - Physician Dosing Inpatient   Does not apply q1600   Continuous Infusions:  sodium chloride       LOS: 0 days   Domenic Polite, MD [facility]  05/08/2021, 12:26 PM

## 2021-05-08 NOTE — TOC Progression Note (Signed)
Transition of Care [facility]) - Progression Note    Patient Details  Name: [name] [name] MRN: [id number] Date of Birth: 1930-08-09  [facility] CM/SW Contact  Milinda Antis, [place] Phone Number: 05/08/2021, 1:27 PM  Clinical Narrative:    CSW spoke with [facility] and confirmed that they will accept the patient.  The facility was informed the need to start insurance auth as the patient is not managed through [place].    Pending insurance auth.   Expected Discharge Plan: Skilled Nursing Facility Barriers to Discharge: Ship broker, SNF Pending bed offer  Expected Discharge Plan and Services Expected Discharge Plan: [place] arrangements for the past 2 months: Single Family Home                                       Social Determinants of Health (SDOH) Interventions    Readmission Risk Interventions No flowsheet data found.

## 2021-05-09 DIAGNOSIS — E1159 Type 2 diabetes mellitus with other circulatory complications: Secondary | ICD-10-CM | POA: Diagnosis not present

## 2021-05-09 DIAGNOSIS — E785 Hyperlipidemia, unspecified: Secondary | ICD-10-CM | POA: Diagnosis not present

## 2021-05-09 DIAGNOSIS — Z7984 Long term (current) use of oral hypoglycemic drugs: Secondary | ICD-10-CM | POA: Diagnosis not present

## 2021-05-09 DIAGNOSIS — Z20822 Contact with and (suspected) exposure to covid-19: Secondary | ICD-10-CM | POA: Diagnosis not present

## 2021-05-09 DIAGNOSIS — R5381 Other malaise: Secondary | ICD-10-CM | POA: Diagnosis not present

## 2021-05-09 DIAGNOSIS — H8190 Unspecified disorder of vestibular function, unspecified ear: Secondary | ICD-10-CM | POA: Diagnosis not present

## 2021-05-09 DIAGNOSIS — S22089A Unspecified fracture of T11-T12 vertebra, initial encounter for closed fracture: Secondary | ICD-10-CM | POA: Diagnosis not present

## 2021-05-09 DIAGNOSIS — R2681 Unsteadiness on feet: Secondary | ICD-10-CM | POA: Diagnosis not present

## 2021-05-09 DIAGNOSIS — M549 Dorsalgia, unspecified: Secondary | ICD-10-CM | POA: Diagnosis not present

## 2021-05-09 DIAGNOSIS — Z794 Long term (current) use of insulin: Secondary | ICD-10-CM | POA: Diagnosis not present

## 2021-05-09 DIAGNOSIS — Z96642 Presence of left artificial hip joint: Secondary | ICD-10-CM | POA: Diagnosis not present

## 2021-05-09 DIAGNOSIS — E118 Type 2 diabetes mellitus with unspecified complications: Secondary | ICD-10-CM | POA: Diagnosis not present

## 2021-05-09 DIAGNOSIS — I480 Paroxysmal atrial fibrillation: Secondary | ICD-10-CM | POA: Diagnosis not present

## 2021-05-09 DIAGNOSIS — Z743 Need for continuous supervision: Secondary | ICD-10-CM | POA: Diagnosis not present

## 2021-05-09 DIAGNOSIS — S22080D Wedge compression fracture of T11-T12 vertebra, subsequent encounter for fracture with routine healing: Secondary | ICD-10-CM | POA: Diagnosis not present

## 2021-05-09 DIAGNOSIS — R791 Abnormal coagulation profile: Secondary | ICD-10-CM | POA: Diagnosis not present

## 2021-05-09 DIAGNOSIS — Z79899 Other long term (current) drug therapy: Secondary | ICD-10-CM | POA: Diagnosis not present

## 2021-05-09 DIAGNOSIS — Z7901 Long term (current) use of anticoagulants: Secondary | ICD-10-CM | POA: Diagnosis not present

## 2021-05-09 DIAGNOSIS — E1142 Type 2 diabetes mellitus with diabetic polyneuropathy: Secondary | ICD-10-CM | POA: Diagnosis not present

## 2021-05-09 DIAGNOSIS — I340 Nonrheumatic mitral (valve) insufficiency: Secondary | ICD-10-CM | POA: Diagnosis not present

## 2021-05-09 DIAGNOSIS — R2689 Other abnormalities of gait and mobility: Secondary | ICD-10-CM | POA: Diagnosis not present

## 2021-05-09 DIAGNOSIS — S22080A Wedge compression fracture of T11-T12 vertebra, initial encounter for closed fracture: Secondary | ICD-10-CM | POA: Diagnosis not present

## 2021-05-09 DIAGNOSIS — R41841 Cognitive communication deficit: Secondary | ICD-10-CM | POA: Diagnosis not present

## 2021-05-09 DIAGNOSIS — E1151 Type 2 diabetes mellitus with diabetic peripheral angiopathy without gangrene: Secondary | ICD-10-CM | POA: Diagnosis not present

## 2021-05-09 DIAGNOSIS — I4891 Unspecified atrial fibrillation: Secondary | ICD-10-CM | POA: Diagnosis not present

## 2021-05-09 DIAGNOSIS — I482 Chronic atrial fibrillation, unspecified: Secondary | ICD-10-CM | POA: Diagnosis not present

## 2021-05-09 DIAGNOSIS — H409 Unspecified glaucoma: Secondary | ICD-10-CM | POA: Diagnosis not present

## 2021-05-09 DIAGNOSIS — E119 Type 2 diabetes mellitus without complications: Secondary | ICD-10-CM | POA: Diagnosis not present

## 2021-05-09 DIAGNOSIS — F329 Major depressive disorder, single episode, unspecified: Secondary | ICD-10-CM | POA: Diagnosis not present

## 2021-05-09 DIAGNOSIS — S22080S Wedge compression fracture of T11-T12 vertebra, sequela: Secondary | ICD-10-CM | POA: Diagnosis not present

## 2021-05-09 DIAGNOSIS — J449 Chronic obstructive pulmonary disease, unspecified: Secondary | ICD-10-CM | POA: Diagnosis not present

## 2021-05-09 DIAGNOSIS — F39 Unspecified mood [affective] disorder: Secondary | ICD-10-CM | POA: Diagnosis not present

## 2021-05-09 DIAGNOSIS — I2510 Atherosclerotic heart disease of native coronary artery without angina pectoris: Secondary | ICD-10-CM | POA: Diagnosis not present

## 2021-05-09 DIAGNOSIS — R0989 Other specified symptoms and signs involving the circulatory and respiratory systems: Secondary | ICD-10-CM | POA: Diagnosis not present

## 2021-05-09 DIAGNOSIS — I10 Essential (primary) hypertension: Secondary | ICD-10-CM | POA: Diagnosis not present

## 2021-05-09 DIAGNOSIS — K130 Diseases of lips: Secondary | ICD-10-CM | POA: Diagnosis not present

## 2021-05-09 DIAGNOSIS — M6281 Muscle weakness (generalized): Secondary | ICD-10-CM | POA: Diagnosis not present

## 2021-05-09 DIAGNOSIS — R1312 Dysphagia, oropharyngeal phase: Secondary | ICD-10-CM | POA: Diagnosis not present

## 2021-05-09 DIAGNOSIS — E871 Hypo-osmolality and hyponatremia: Secondary | ICD-10-CM | POA: Diagnosis not present

## 2021-05-09 DIAGNOSIS — I152 Hypertension secondary to endocrine disorders: Secondary | ICD-10-CM | POA: Diagnosis not present

## 2021-05-09 LAB — BASIC METABOLIC PANEL
Anion gap: 13 (ref 5–15)
BUN: 20 mg/dL (ref 8–23)
CO2: 23 mmol/L (ref 22–32)
Calcium: 9.4 mg/dL (ref 8.9–10.3)
Chloride: 95 mmol/L — ABNORMAL LOW (ref 98–111)
Creatinine, Ser: 0.82 mg/dL (ref 0.44–1.00)
GFR, Estimated: >60 mL/min (ref >60)
Glucose, Bld: 192 mg/dL — ABNORMAL HIGH (ref 70–99)
Potassium: 4.7 mmol/L (ref 3.5–5.1)
Sodium: 131 mmol/L — ABNORMAL LOW (ref 135–145)

## 2021-05-09 LAB — PROTIME-INR
INR: 3.5 — ABNORMAL HIGH (ref 0.8–1.2)
Prothrombin Time: 35.3 seconds — ABNORMAL HIGH (ref 11.4–15.2)

## 2021-05-09 LAB — GLUCOSE, CAPILLARY
Glucose-Capillary: 182 mg/dL — ABNORMAL HIGH (ref 70–99)
Glucose-Capillary: 280 mg/dL — ABNORMAL HIGH (ref 70–99)

## 2021-05-09 LAB — RESP PANEL BY RT-PCR (FLU A&B, COVID) ARPGX2
Influenza A by PCR: NEGATIVE
Influenza B by PCR: NEGATIVE
SARS Coronavirus 2 by RT PCR: NEGATIVE

## 2021-05-09 MED ORDER — CYCLOBENZAPRINE HCL 5 MG PO TABS: 5.0000 mg | ORAL_TABLET | Freq: Three times a day (TID) | ORAL | 0 refills | Status: AC | PRN

## 2021-05-09 MED ORDER — LIDOCAINE 5 % EX PTCH: 1.0000 | MEDICATED_PATCH | Freq: Every day | CUTANEOUS | 0 refills | Status: AC

## 2021-05-09 MED ORDER — INSULIN GLARGINE (1 UNIT DIAL) 300 UNIT/ML ~~LOC~~ SOPN: 8.0000 [IU] | PEN_INJECTOR | Freq: Every morning | SUBCUTANEOUS | Status: AC

## 2021-05-09 MED ORDER — FUROSEMIDE 40 MG PO TABS: 20.0000 mg | ORAL_TABLET | Freq: Every day | ORAL | Status: AC

## 2021-05-09 NOTE — Progress Notes (Signed)
Physical Therapy Treatment Patient Details Name: [name] [name] MRN: [id number] DOB: 16-Aug-1930 Today's Date: 05/09/2021    History of Present Illness Pt. is 87 yr old F admitted on 05/05/21. Recently diagnosed with T12 compression fx on 05/01/21, placed in TLSO but has continued falls and worsening pain. CT T spine revealed unchanged recent T12 split type fracture and moderate bilateral L4 foraminal stenosis and moderate L4-L5 spinal canal stenosis. PMH: DM, HTN, glaucoma, a-fib, mitral regurgitation, depression, anxiety, COPD.    PT Comments    Pt supine in bed on arrival this session.  Pt required max cues for encouragement this session.  Able to increase gt distance this session.  Pt tolerated session well and very fatigued post gt trial.      Follow Up Recommendations  SNF     Equipment Recommendations  Rolling walker with 5" wheels    Recommendations for Other Services       Precautions / Restrictions Precautions Precautions: Back;Fall Precaution Booklet Issued: No Precaution Comments: Spinal precautions Required Braces or Orthoses: Spinal Brace Spinal Brace: Thoracolumbosacral orthotic Restrictions Weight Bearing Restrictions: No    Mobility  Bed Mobility Overal bed mobility: Needs Assistance Bed Mobility: Rolling;Sidelying to Sit Rolling: Min assist Sidelying to sit: Min assist       General bed mobility comments: pt demonstrated fair ability to log roll, min A to elevate trunk    Transfers Overall transfer level: Needs assistance Equipment used: Rolling walker (2 wheeled) Transfers: Sit to/from Stand Sit to Stand: Min assist         General transfer comment: Cues for hand placement to rise into standing from seated surface.  Posterior bias.  Ambulation/Gait Ambulation/Gait assistance: Mod assist;+2 safety/equipment Gait Distance (Feet): 25 Feet Assistive device: Rolling walker (2 wheeled) Gait Pattern/deviations: Step-to pattern;Decreased step  length - right;Decreased step length - left;Trunk flexed     General Gait Details: Pt required max VCs for increased gt distance this session.  Pt required cues for increased stride length this session.  Max cues for encouragement with close chair follow.   Stairs             Wheelchair Mobility    Modified Rankin (Stroke Patients Only)       Balance Overall balance assessment: Needs assistance Sitting-balance support: Feet supported Sitting balance-Leahy Scale: Fair       Standing balance-Leahy Scale: Poor                              Cognition Arousal/Alertness: Awake/alert Behavior During Therapy: WFL for tasks assessed/performed Overall Cognitive Status: Impaired/Different from baseline Area of Impairment: Orientation;Memory;Safety/judgement;Awareness                 Orientation Level: Disoriented to;Place;Situation   Memory: Decreased recall of precautions;Decreased short-term memory   Safety/Judgement: Decreased awareness of safety;Decreased awareness of deficits Awareness: Emergent   General Comments: Pt unable to recall therapist from last session.      Exercises      General Comments        Pertinent Vitals/Pain Pain Assessment: Faces Faces Pain Scale: Hurts a little bit Pain Location: Mid back Pain Descriptors / Indicators: Sore Pain Intervention(s): Monitored during session;Repositioned    Home Living                      Prior Function            PT Goals (current goals can  now be found in the care plan section) Acute Rehab PT Goals Patient Stated Goal: Pt's goal is to go to SNF to regain strength prior to return home. Potential to Achieve Goals: Good Progress towards PT goals: Progressing toward goals    Frequency    Min 3X/week      PT Plan Current plan remains appropriate    Co-evaluation              AM-PAC PT "6 Clicks" Mobility   Outcome Measure  Help needed turning from your  back to your side while in a flat bed without using bedrails?: A Little Help needed moving from lying on your back to sitting on the side of a flat bed without using bedrails?: A Little Help needed moving to and from a bed to a chair (including a wheelchair)?: A Little Help needed standing up from a chair using your arms (e.g., wheelchair or bedside chair)?: A Little Help needed to walk in hospital room?: A Lot Help needed climbing 3-5 steps with a railing? : Total 6 Click Score: 15    End of Session Equipment Utilized During Treatment: Gait belt Activity Tolerance: Patient tolerated treatment well;Patient limited by pain Patient left: in chair;with chair alarm set;with call bell/phone within reach Nurse Communication: Mobility status PT Visit Diagnosis: Unsteadiness on feet (R26.81);Repeated falls (R29.6);Muscle weakness (generalized) (M62.81);History of falling (Z91.81);Pain Pain - part of body:  (back)     Time: 1100-1119 PT Time Calculation (min) (ACUTE ONLY): 19 min  Charges:  $Gait Training: 8-22 mins                     Erasmo Leventhal , PTA Acute Rehabilitation Services Pager [phone] Office [phone]    Devera Englander Eli Hose 05/09/2021, 11:42 AM

## 2021-05-09 NOTE — Progress Notes (Signed)
Occupational Therapy Treatment Patient Details Name: [name] [name] MRN: [id number] DOB: [birth year] Today's Date: 05/09/2021    History of present illness Pt. is 87 yr old F admitted on 05/05/21. Recently diagnosed with T12 compression fx on 05/01/21, placed in TLSO but has continued falls and worsening pain. CT T spine revealed unchanged recent T12 split type fracture and moderate bilateral L4 foraminal stenosis and moderate L4-L5 spinal canal stenosis. PMH: DM, HTN, glaucoma, a-fib, mitral regurgitation, depression, anxiety, COPD.   OT comments  Pt making incremental progress physically with OT goals. Pt able to complete transfers with Min A, still requiring Max A for pericare/LB ADL's. This session pt reported increased pain in back and abdomen, requiring increased encouragement to participate in therapy. OT will continue to follow acutely.    Follow Up Recommendations  SNF;Supervision - Intermittent    Equipment Recommendations  None recommended by OT    Recommendations for Other Services      Precautions / Restrictions Precautions Precautions: Back;Fall Precaution Booklet Issued: No Precaution Comments: Spinal precautions Required Braces or Orthoses: Spinal Brace Spinal Brace: Thoracolumbosacral orthotic Restrictions Weight Bearing Restrictions: No       Mobility Bed Mobility Overal bed mobility: Needs Assistance Bed Mobility: Sit to Sidelying;Rolling Rolling: Min guard Sidelying to sit: Min assist     Sit to sidelying: Min assist General bed mobility comments: Min A for BLE managment back into bed    Transfers Overall transfer level: Needs assistance Equipment used: Rolling walker (2 wheeled) Transfers: Sit to/from [facility] Sit to Stand: Min assist Stand pivot transfers: Min assist       General transfer comment: Cues for hand placement to rise into standing from seated surface.  Posterior bias.    Balance Overall balance assessment:  Needs assistance Sitting-balance support: Feet supported Sitting balance-Leahy Scale: Fair     Standing balance support: Bilateral upper extremity supported Standing balance-Leahy Scale: Poor Standing balance comment: reliant on BUE w/ RW and at times external support                           ADL either performed or assessed with clinical judgement   ADL Overall ADL's : Needs assistance/impaired     Grooming: Set up;Sitting Grooming Details (indicate cue type and reason): washed face and hands sitting on BSC     Lower Body Bathing: Maximal assistance;Sit to/from stand Lower Body Bathing Details (indicate cue type and reason): Needed max a to complete pericare post BM         Toilet Transfer: Minimal assistance;Stand-pivot;BSC;RW Toilet Transfer Details (indicate cue type and reason): x2 from recliner to [facility] and BSC to bed Toileting- Clothing Manipulation and Hygiene: Maximal assistance;Sit to/from stand Toileting - Clothing Manipulation Details (indicate cue type and reason): Pt unable to complete pericare     Functional mobility during ADLs: Minimal assistance;Rolling walker General ADL Comments: Pt with increased assist levels due to cognitive deficits, weakness and back precations     Vision       Perception     Praxis      Cognition Arousal/Alertness: Awake/alert Behavior During Therapy: WFL for tasks assessed/performed Overall Cognitive Status: Impaired/Different from baseline Area of Impairment: Orientation;Memory;Safety/judgement;Awareness                 Orientation Level: Disoriented to;Place;Situation   Memory: Decreased recall of precautions;Decreased short-term memory   Safety/Judgement: Decreased awareness of safety;Decreased awareness of deficits Awareness: Emergent   General Comments: Pt  did not know she was in the hospital or why she was here.        Exercises     Shoulder Instructions       General Comments Pt very  focused on "somethings on my rectum stopping me from pooping" despite having a large BM in BSC. Focused on that the entire session, reporting OT did not know what she was talking about when assisting pt in toileting and pericare    Pertinent Vitals/ Pain       Pain Assessment: Faces Faces Pain Scale: Hurts little more Pain Location: Mid back Pain Descriptors / Indicators: Discomfort;Grimacing;Sore Pain Intervention(s): Monitored during session;Repositioned  Home Living                                          Prior Functioning/Environment              Frequency  Min 2X/week        Progress Toward Goals  OT Goals(current goals can now be found in the care plan section)  Progress towards OT goals: Progressing toward goals  Acute Rehab OT Goals Patient Stated Goal: to get out of the hospital OT Goal Formulation: With patient Time For Goal Achievement: 05/21/21 Potential to Achieve Goals: Fair ADL Goals Pt Will Perform Lower Body Bathing: with supervision;sit to/from stand Pt Will Perform Upper Body Dressing: with modified independence;sitting Pt Will Perform Lower Body Dressing: with set-up;sit to/from stand Pt Will Transfer to Toilet: with supervision;stand pivot transfer;bedside commode Pt Will Perform Toileting - Clothing Manipulation and hygiene: with supervision;sit to/from stand  Plan Discharge plan remains appropriate;Frequency remains appropriate    Co-evaluation                 AM-PAC OT "6 Clicks" Daily Activity     Outcome Measure   Help from another person eating meals?: A Little Help from another person taking care of personal grooming?: A Little Help from another person toileting, which includes using toliet, bedpan, or urinal?: A Little Help from another person bathing (including washing, rinsing, drying)?: A Lot Help from another person to put on and taking off regular upper body clothing?: A Lot Help from another person to  put on and taking off regular lower body clothing?: A Lot 6 Click Score: 15    End of Session Equipment Utilized During Treatment: Gait belt;Back brace;Rolling walker  OT Visit Diagnosis: Unsteadiness on feet (R26.81);Other abnormalities of gait and mobility (R26.89);Muscle weakness (generalized) (M62.81);History of falling (Z91.81);Pain   Activity Tolerance Patient tolerated treatment well   Patient Left in bed;with call bell/phone within reach;with bed alarm set   Nurse Communication Mobility status;Precautions;Weight bearing status        Time: [id number] OT Time Calculation (min): 34 min  Charges: OT General Charges $OT Visit: 1 Visit OT Treatments $Self Care/Home Management : 23-37 mins  Juri Dinning H., OTR/L Acute Rehabilitation  Zarek Relph Elane Evon Dejarnett 05/09/2021, 2:16 PM

## 2021-05-09 NOTE — TOC Transition Note (Signed)
Transition of Care [facility]) - CM/SW Discharge Note   Patient Details  Name: [name] [name] MRN: [id number] Date of Birth: 1930-07-11  Transition of Care [facility]) CM/SW Contact:  Milinda Antis, [place] Phone Number: 05/09/2021, 12:53 PM   Clinical Narrative:    Patient will DC to:  [place] Anticipated DC date: 05/09/2021 Family notified:  Yes Transport by: Corey Harold   Per MD patient ready for DC to SNF. RN to call report prior to discharge [phone] . RN, patient's family, and facility notified of DC. Discharge Summary and FL2 sent to facility. DC packet on chart. Ambulance transport will be requested for patient when RN reports that the patient is ready.   CSW will sign off for now as social work intervention is no longer needed. Please consult [place] again if new needs arise.     Final next level of care: Skilled Nursing Facility Barriers to Discharge: Barriers Resolved   Patient Goals and CMS Choice   CMS Medicare.gov Compare Post Acute Care list provided to:: Patient Represenative (must comment) (spouse) Choice offered to / list presented to : Spouse  Discharge Placement              Patient [facility] bed at: [facility] Patient to be transferred to facility by: [place] Name of family member notified: [name] [name],[name] [name] (Spouse)   [phone] Patient and family notified of of transfer: 05/09/21  Discharge Plan and Services                                     Social Determinants of Health (SDOH) Interventions     Readmission Risk Interventions No flowsheet data found.

## 2021-05-09 NOTE — Discharge Summary (Addendum)
Physician Discharge Summary  Maguire Meola [id number] DOB: 1930-04-01 DOA: 05/05/2021  PCP: [name], MD  Admit date: 05/05/2021 Discharge date: 05/09/2021  Time spent: 56mnutes  Recommendations for Outpatient Follow-up:  PCP in 1 week, consider CODE STATUS discussions Please titrate insulin dose for CBGs Monitor INR, on chronic Coumadin for A. Fib, hold coumadin dose tonight for INR of 3.5  Discharge Diagnoses:  Principal Problem:   T12 compression fracture, sequela Active Problems:   DM II (diabetes mellitus, type II), controlled ([place]   HTN (hypertension)   Hypercholesteremia   Glaucoma (increased eye pressure)   P. Atrial fibrillation    Mitral regurgitation   Depression   Anxiety   COPD (chronic obstructive pulmonary disease) (HCC)   Major depressive disorder, recurrent episode (HCC)   Type 2 diabetes mellitus without retinopathy ([facility]   Gastro-esophageal reflux disease without esophagitis Hyperkalemia Hyponatremia  Discharge Condition: Stable  Diet recommendation: Diabetic  There were no vitals filed for this visit.  History of present illness:   87y.o. female with medical history significant of atrial fib on Coumadin, carotid stenosis, COPD, type 2 diabetes, hypertension, hyperlipidemia, mitral regurgitation who was seen approximately 3 days ago with severe back pain.  Work-up at that time revealed a new T12 compression fracture and discharged home with LSO brace however presented with unrelieved pain and sob. Also recently found to have UTI on macrobid.   Hospital Course:   Low back pain, radiating across anteriorly T12 compression fracture -Initially presented with significant pain due to this compression fracture. -Pain improving, continue scheduled Tylenol, lidocaine patch -Flexeril added -Seen by PT who recommends SNF, TOC consulted -Discharge to SNF for short-term rehab   Hyperkalemia -Resolved, treated with Lokelma yesterday    Hyponatremia -Improving, sodium up to 131, HCTZ discontinued, Lasix dose decreased  Type 2 diabetes -Lantus resumed at a lower dose, titrate dose based on CBGs - A1c of 7.8  Hypertension -Continue Cardizem, -Blood pressure soft, hold Lasix, discontinue HCTZ  Hypercholesterolemia -Continue Crestor  Atrial fibrillation -On Coumadin per pharmacy dosing -Rate controlled at present -Continued on cardizem CD '120mg'$ , metoprolol 12.'5mg'$  bid  Mitral regurgitation Mild in 2015, per cardiology no further follow-up is needed.  COPD Albuterol as needed On minimal O2 support this AM, stable  Depression Continue doxepin  Anxiety Continue doxepin as needed  Glaucoma Continue pilocarpine   UTI -Was on antibiotics prior to admission,, completed 3 days of Keflex inpatient, antibiotics discontinued now  Discharge Exam: Vitals:   05/08/21 1134 05/09/21 0816  BP: (!) 117/46 (!) 141/59  Pulse: 65 80  Resp: 18 18  Temp: 97.8 F (36.6 C) 98.6 F (37 C)  SpO2: 99% 96%    General: AAOx2, cognitive deficits noted Cardiovascular: S1-S2, regular rate rhythm Respiratory: Clear anteriorly  Discharge Instructions   Discharge Instructions     Diet - low sodium heart healthy   Complete by: As directed    Diet Carb Modified   Complete by: As directed    Increase activity slowly   Complete by: As directed       Allergies as of 05/09/2021       Reactions   Ciprofloxacin Swelling   Site of swelling not recalled   Lactose Intolerance (gi) Nausea And Vomiting, Other (See Comments)   severe stomach pain   Penicillins Hives, Itching   "haven't used any since 1970's" - have gotten cephalosporins multiple times Has patient had a PCN reaction causing immediate rash, facial/tongue/throat swelling, SOB or lightheadedness with hypotension: Yes  Has patient had a PCN reaction causing severe rash involving mucus membranes or skin necrosis: Unk Has patient had a PCN reaction that required  hospitalization: Unk Has patient had a PCN reaction occurring within the last 10 years: No If all of the above answers are "NO", then may proceed with C   Sulfa Antibiotics Hives, Itching   "haven't used any since 1970's"   Sulfasalazine Hives, Itching   "haven't used any since 1970's"   Lisinopril Other (See Comments)   Other reaction(s): associated with hyperkalemia        Medication List     STOP taking these medications    hydrochlorothiazide 12.5 MG capsule Commonly known as: MICROZIDE   nitrofurantoin (macrocrystal-monohydrate) 100 MG capsule Commonly known as: MACROBID   potassium chloride SA 20 MEQ tablet Commonly known as: KLOR-CON   promethazine 25 MG tablet Commonly known as: PHENERGAN       TAKE these medications    acetaminophen 325 MG tablet Commonly known as: TYLENOL Take 650 mg by mouth every 6 (six) hours as needed for mild pain.   albuterol (2.5 MG/3ML) 0.083% nebulizer solution Commonly known as: PROVENTIL take 1 treatment up to 3 times daily as needed for difficulty breathing   BD Pen Needle Nano U/F 32G X 4 MM Misc Generic drug: Insulin Pen Needle   cyclobenzaprine 5 MG tablet Commonly known as: FLEXERIL Take 1 tablet (5 mg total) by mouth 3 (three) times daily as needed for up to 5 days for muscle spasms.   doxepin 10 MG capsule Commonly known as: SINEQUAN Take 10 mg by mouth at bedtime.   furosemide 40 MG tablet Commonly known as: LASIX Take 0.5 tablets (20 mg total) by mouth daily. Take one tablet (40 mg) by mouth once a day What changed:  how much to take when to take this   gabapentin 100 MG capsule Commonly known as: NEURONTIN Take 100 mg by mouth daily.   glimepiride 4 MG tablet Commonly known as: AMARYL Take 4 mg by mouth daily with breakfast.   insulin glargine (1 Unit Dial) 300 UNIT/ML Solostar Pen Commonly known as: TOUJEO Inject 8 Units into the skin every morning. What changed: how much to take   latanoprost  0.005 % ophthalmic solution Commonly known as: XALATAN Place 1 drop into both eyes at bedtime.   lidocaine 5 % Commonly known as: LIDODERM Place 1 patch onto the skin daily. Remove & Discard patch within 12 hours or as directed by MD   metoprolol tartrate 25 MG tablet Commonly known as: LOPRESSOR Take 12.5 mg by mouth 2 (two) times daily.   multivitamin with minerals Tabs tablet Take 1 tablet by mouth daily.   mupirocin ointment 2 % Commonly known as: BACTROBAN Apply 1 application topically 2 (two) times daily.   ondansetron 4 MG tablet Commonly known as: ZOFRAN Take 4 mg by mouth every 8 (eight) hours as needed for nausea or vomiting.   OneTouch Delica Plus [id number] Misc 1 each by Misc.(Non-Drug; Combo Route) route in the morning and at bedtime. DX:  E11.9   oxybutynin 5 MG tablet Commonly known as: DITROPAN Take 5 mg by mouth at bedtime.   pantoprazole 40 MG tablet Commonly known as: PROTONIX Take 1 tablet (40 mg total) by mouth 2 (two) times daily.   pilocarpine 1 % ophthalmic solution Commonly known as: PILOCAR Place 1 drop into both eyes 2 (two) times daily.   PROBIOTIC PO Take 1 capsule by mouth daily.   rOPINIRole  0.5 MG tablet Commonly known as: REQUIP Take 0.5 mg by mouth at bedtime.   rosuvastatin 10 MG tablet Commonly known as: CRESTOR Take 10 mg by mouth daily.   Symbicort 160-4.5 MCG/ACT inhaler Generic drug: budesonide-formoterol Inhale 2 puffs into the lungs 2 (two) times daily.   timolol 0.5 % ophthalmic solution Commonly known as: TIMOPTIC Place 1 drop into both eyes 2 (two) times daily.   warfarin 2.5 MG tablet Commonly known as: COUMADIN,   HOLD Warfarin dose tonight and then resume per routine Take 1.25-2.5 mg by mouth daily. Take 2.5 mg on Mon,Wed,Fri. Take 1.25 mg all other days       ASK your doctor about these medications    diltiazem 120 MG 24 hr capsule Commonly known as: Dilt-XR TAKE 1 CAPSULE(120 MG) BY MOUTH DAILY    loperamide 2 MG tablet Commonly known as: Loperamide A-D Take 1 tablet (2 mg total) by mouth as needed for diarrhea or loose stools (use at bedtime).       Allergies  Allergen Reactions   Ciprofloxacin Swelling    Site of swelling not recalled   Lactose Intolerance (Gi) Nausea And Vomiting and Other (See Comments)    severe stomach pain   Penicillins Hives and Itching    "haven't used any since 1970's" - have gotten cephalosporins multiple times Has patient had a PCN reaction causing immediate rash, facial/tongue/throat swelling, SOB or lightheadedness with hypotension: Yes Has patient had a PCN reaction causing severe rash involving mucus membranes or skin necrosis: Unk Has patient had a PCN reaction that required hospitalization: Unk Has patient had a PCN reaction occurring within the last 10 years: No If all of the above answers are "NO", then may proceed with C   Sulfa Antibiotics Hives and Itching    "haven't used any since 1970's"   Sulfasalazine Hives and Itching    "haven't used any since 1970's"   Lisinopril Other (See Comments)    Other reaction(s): associated with hyperkalemia      The results of significant diagnostics from this hospitalization (including imaging, microbiology, ancillary and laboratory) are listed below for reference.    Significant Diagnostic Studies: CT Abdomen Pelvis Wo Contrast  Result Date: 05/01/2021 CLINICAL DATA:  Abdominal trauma Low back pain, increased fracture risk elderly fall with low back and abdominal pain. EXAM: CT ABDOMEN AND PELVIS WITHOUT CONTRAST TECHNIQUE: Multidetector CT imaging of the abdomen and pelvis was performed following the standard protocol without IV contrast. COMPARISON:  October 28, 2017 FINDINGS: Evaluation is limited secondary to lack of IV contrast. Lower chest: Bibasilar atelectasis. Three-vessel coronary artery atherosclerotic calcifications. Hepatobiliary: Status post cholecystectomy. Unchanged punctate  calcification of the LEFT liver likely reflecting sequela of remote prior granulomatous infection. Otherwise unremarkable noncontrast appearance of the liver. Pancreas: No peripancreatic fat stranding. Spleen: Unremarkable. Adrenals/Urinary Tract: Adrenal glands are unremarkable. RIGHT renal cyst. No hydronephrosis. No obstructing nephrolithiasis visualized although evaluation of the UVJ and bladder is limited secondary to streak artifact from LEFT hip arthroplasty. Stomach/Bowel: No evidence of bowel obstruction. Diverticulosis without evidence of acute diverticulitis. Appendix is not visualized. Vascular/Lymphatic: Atherosclerotic calcifications of the aorta. No suspicious lymphadenopathy. Reproductive: Status post hysterectomy. No adnexal masses. Other: No free air or free fluid. Musculoskeletal: Status post LEFT hip arthroplasty. There is a moderate compression fracture of T12, new since 2019. Unchanged grade 1 anterolisthesis of L4-5 with associated severe degenerative changes. IMPRESSION: 1. There is a moderate compression fracture of T12, new since 2019. This is age indeterminate. Recommend  correlation with point tenderness. 2. No evidence of acute traumatic visceral injury. Aortic Atherosclerosis (ICD10-I70.0). Electronically Signed   By: Valentino Saxon MD   On: 05/01/2021 13:09   CT Thoracic Spine Wo Contrast  Result Date: 05/05/2021 CLINICAL DATA:  Falls with T12 compression fracture EXAM: CT THORACIC AND LUMBAR SPINE WITHOUT CONTRAST TECHNIQUE: Multidetector CT imaging of the thoracic and lumbar spine was performed without contrast. Multiplanar CT image reconstructions were also generated. COMPARISON:  CT abdomen pelvis 05/01/2021 FINDINGS: CT THORACIC SPINE FINDINGS Alignment: Normal. Vertebrae: Recent T12 fracture is unchanged. There is no new fracture. Paraspinal and other soft tissues: Calcific aortic atherosclerosis. Mild pulmonary edema. Cardiomegaly. Disc levels: No spinal canal stenosis CT  LUMBAR SPINE FINDINGS Segmentation: 5 lumbar type vertebrae. Alignment: Grade 1 anterolisthesis at L4-5 Vertebrae: No acute fracture. Multiple large Schmorl's nodes. Endplate sclerosis at L4 and L5. Paraspinal and other soft tissues: Calcific aortic atherosclerosis. Disc levels: Moderate bilateral L4 foraminal stenosis and moderate L4-5 spinal canal stenosis secondary to disc bulge and facet hypertrophy. No spinal canal stenosis. IMPRESSION: 1. Recent T12 split type fracture (AO Spine A2) is unchanged. No new fracture. 2. Moderate bilateral L4 foraminal stenosis and moderate L4-5 spinal canal stenosis. 3. Cardiomegaly and mild pulmonary edema. Aortic Atherosclerosis (ICD10-I70.0). Electronically Signed   By: Ulyses Jarred M.D.   On: 05/05/2021 19:19   CT Lumbar Spine Wo Contrast  Result Date: 05/05/2021 CLINICAL DATA:  Falls with T12 compression fracture EXAM: CT THORACIC AND LUMBAR SPINE WITHOUT CONTRAST TECHNIQUE: Multidetector CT imaging of the thoracic and lumbar spine was performed without contrast. Multiplanar CT image reconstructions were also generated. COMPARISON:  CT abdomen pelvis 05/01/2021 FINDINGS: CT THORACIC SPINE FINDINGS Alignment: Normal. Vertebrae: Recent T12 fracture is unchanged. There is no new fracture. Paraspinal and other soft tissues: Calcific aortic atherosclerosis. Mild pulmonary edema. Cardiomegaly. Disc levels: No spinal canal stenosis CT LUMBAR SPINE FINDINGS Segmentation: 5 lumbar type vertebrae. Alignment: Grade 1 anterolisthesis at L4-5 Vertebrae: No acute fracture. Multiple large Schmorl's nodes. Endplate sclerosis at L4 and L5. Paraspinal and other soft tissues: Calcific aortic atherosclerosis. Disc levels: Moderate bilateral L4 foraminal stenosis and moderate L4-5 spinal canal stenosis secondary to disc bulge and facet hypertrophy. No spinal canal stenosis. IMPRESSION: 1. Recent T12 split type fracture (AO Spine A2) is unchanged. No new fracture. 2. Moderate bilateral L4  foraminal stenosis and moderate L4-5 spinal canal stenosis. 3. Cardiomegaly and mild pulmonary edema. Aortic Atherosclerosis (ICD10-I70.0). Electronically Signed   By: Ulyses Jarred M.D.   On: 05/05/2021 19:19    Microbiology: Recent Results (from the past 240 hour(s))  Resp Panel by RT-PCR (Flu A&B, Covid) Nasopharyngeal Swab     Status: None   Collection Time: 05/05/21 10:00 PM   Specimen: Nasopharyngeal Swab; Nasopharyngeal(NP) swabs in vial transport medium  Result Value Ref Range Status   SARS Coronavirus 2 by RT PCR NEGATIVE NEGATIVE Final    Comment: (NOTE) SARS-CoV-2 target nucleic acids are NOT DETECTED.  The SARS-CoV-2 RNA is generally detectable in upper respiratory specimens during the acute phase of infection. The lowest concentration of SARS-CoV-2 viral copies this assay can detect is 138 copies/mL. A negative result does not preclude SARS-Cov-2 infection and should not be used as the sole basis for treatment or other patient management decisions. A negative result may occur with  improper specimen collection/handling, submission of specimen other than nasopharyngeal swab, presence of viral mutation(s) within the areas targeted by this assay, and inadequate number of viral copies(<138 copies/mL). A negative result  must be combined with clinical observations, patient history, and epidemiological information. The expected result is Negative.  Fact Sheet for Patients:  EntrepreneurPulse.com.au  Fact Sheet for Healthcare Providers:  IncredibleEmployment.be  This test is no t yet approved or cleared by the [place] FDA and  has been authorized for detection and/or diagnosis of SARS-CoV-2 by FDA under an Emergency Use Authorization (EUA). This EUA will remain  in effect (meaning this test can be used) for the duration of the COVID-19 declaration under Section 564(b)(1) of the Act, 21 U.S.C.section 360bbb-3(b)(1), unless the  authorization is terminated  or revoked sooner.       Influenza A by PCR NEGATIVE NEGATIVE Final   Influenza B by PCR NEGATIVE NEGATIVE Final    Comment: (NOTE) The Xpert Xpress SARS-CoV-2/FLU/RSV plus assay is intended as an aid in the diagnosis of influenza from Nasopharyngeal swab specimens and should not be used as a sole basis for treatment. Nasal washings and aspirates are unacceptable for Xpert Xpress SARS-CoV-2/FLU/RSV testing.  Fact Sheet for Patients: EntrepreneurPulse.com.au  Fact Sheet for Healthcare Providers: IncredibleEmployment.be  This test is not yet approved or cleared by the [place] FDA and has been authorized for detection and/or diagnosis of SARS-CoV-2 by FDA under an Emergency Use Authorization (EUA). This EUA will remain in effect (meaning this test can be used) for the duration of the COVID-19 declaration under Section 564(b)(1) of the Act, 21 U.S.C. section 360bbb-3(b)(1), unless the authorization is terminated or revoked.  Performed at [facility], [place] [address] [place], [place] [address]      Labs: Basic Metabolic Panel: Recent Labs  Lab 05/05/21 2149 05/06/21 0658 05/07/21 0142 05/08/21 0217 05/09/21 0257  NA 134* 134* 129* 128* 131*  K 3.3* 3.4* 4.4 5.3* 4.7  CL 95* 94* 94* 91* 95*  CO2 '27 29 22 '$ 21* 23  GLUCOSE 123* 91 278* 170* 192*  BUN '14 14 20 23 20  '$ CREATININE 0.82 0.87 1.01* 1.00 0.82  CALCIUM 9.1 9.3 9.0 9.3 9.4   Liver Function Tests: Recent Labs  Lab 05/06/21 0658 05/07/21 0142  AST 26 33  ALT 23 26  ALKPHOS 101 101  BILITOT 1.0 0.9  PROT 6.4* 6.4*  ALBUMIN 3.0* 2.8*   No results for input(s): LIPASE, AMYLASE in the last 168 hours. No results for input(s): AMMONIA in the last 168 hours. CBC: Recent Labs  Lab 05/05/21 2149 05/07/21 0142 05/08/21 0217  WBC 10.1 10.4 9.2  NEUTROABS 6.8  --   --   HGB 13.3 12.9 12.5  HCT 40.5 38.4 38.5  MCV 93.5 91.6 93.4   PLT 241 254 295   Cardiac Enzymes: No results for input(s): CKTOTAL, CKMB, CKMBINDEX, TROPONINI in the last 168 hours. BNP: BNP (last 3 results) Recent Labs    05/06/21 0057  BNP 103.0*    ProBNP (last 3 results) No results for input(s): PROBNP in the last 8760 hours.  CBG: Recent Labs  Lab 05/08/21 0821 05/08/21 1133 05/08/21 1608 05/08/21 2110 05/09/21 0628  GLUCAP 193* 136* 161* 161* 182*       Signed:  Domenic Polite MD.  [facility] 05/09/2021, 10:50 AM

## 2021-05-09 NOTE — Care Management Obs Status (Signed)
[facility] NOTIFICATION   Patient Details  Name: Ritha Kinslow MRN: [id number] Date of Birth: March 18, 1930   Medicare Observation Status Notification Given:   yes    Sharin Mons, RN 05/09/2021, 10:09 AM

## 2021-05-12 ENCOUNTER — Ambulatory Visit: Payer: Medicare HMO | Admitting: Podiatry

## 2021-05-12 DIAGNOSIS — R2681 Unsteadiness on feet: Secondary | ICD-10-CM | POA: Diagnosis not present

## 2021-05-12 DIAGNOSIS — I10 Essential (primary) hypertension: Secondary | ICD-10-CM | POA: Diagnosis not present

## 2021-05-12 DIAGNOSIS — H409 Unspecified glaucoma: Secondary | ICD-10-CM | POA: Diagnosis not present

## 2021-05-12 DIAGNOSIS — I4891 Unspecified atrial fibrillation: Secondary | ICD-10-CM | POA: Diagnosis not present

## 2021-05-12 DIAGNOSIS — S22080D Wedge compression fracture of T11-T12 vertebra, subsequent encounter for fracture with routine healing: Secondary | ICD-10-CM | POA: Diagnosis not present

## 2021-05-12 DIAGNOSIS — J449 Chronic obstructive pulmonary disease, unspecified: Secondary | ICD-10-CM | POA: Diagnosis not present

## 2021-05-12 DIAGNOSIS — I2510 Atherosclerotic heart disease of native coronary artery without angina pectoris: Secondary | ICD-10-CM | POA: Diagnosis not present

## 2021-05-12 DIAGNOSIS — F329 Major depressive disorder, single episode, unspecified: Secondary | ICD-10-CM | POA: Diagnosis not present

## 2021-05-12 DIAGNOSIS — M6281 Muscle weakness (generalized): Secondary | ICD-10-CM | POA: Diagnosis not present

## 2021-05-13 DIAGNOSIS — Z794 Long term (current) use of insulin: Secondary | ICD-10-CM | POA: Diagnosis not present

## 2021-05-13 DIAGNOSIS — I152 Hypertension secondary to endocrine disorders: Secondary | ICD-10-CM | POA: Diagnosis not present

## 2021-05-13 DIAGNOSIS — J449 Chronic obstructive pulmonary disease, unspecified: Secondary | ICD-10-CM | POA: Diagnosis not present

## 2021-05-13 DIAGNOSIS — S22080A Wedge compression fracture of T11-T12 vertebra, initial encounter for closed fracture: Secondary | ICD-10-CM | POA: Diagnosis not present

## 2021-05-13 DIAGNOSIS — F39 Unspecified mood [affective] disorder: Secondary | ICD-10-CM | POA: Diagnosis not present

## 2021-05-13 DIAGNOSIS — H8190 Unspecified disorder of vestibular function, unspecified ear: Secondary | ICD-10-CM | POA: Diagnosis not present

## 2021-05-13 DIAGNOSIS — I340 Nonrheumatic mitral (valve) insufficiency: Secondary | ICD-10-CM | POA: Diagnosis not present

## 2021-05-13 DIAGNOSIS — E1159 Type 2 diabetes mellitus with other circulatory complications: Secondary | ICD-10-CM | POA: Diagnosis not present

## 2021-05-13 DIAGNOSIS — I4891 Unspecified atrial fibrillation: Secondary | ICD-10-CM | POA: Diagnosis not present

## 2021-05-14 DIAGNOSIS — I4891 Unspecified atrial fibrillation: Secondary | ICD-10-CM | POA: Diagnosis not present

## 2021-05-14 DIAGNOSIS — S22080A Wedge compression fracture of T11-T12 vertebra, initial encounter for closed fracture: Secondary | ICD-10-CM | POA: Diagnosis not present

## 2021-05-14 DIAGNOSIS — R791 Abnormal coagulation profile: Secondary | ICD-10-CM | POA: Diagnosis not present

## 2021-05-15 DIAGNOSIS — R2681 Unsteadiness on feet: Secondary | ICD-10-CM | POA: Diagnosis not present

## 2021-05-15 DIAGNOSIS — E1159 Type 2 diabetes mellitus with other circulatory complications: Secondary | ICD-10-CM | POA: Diagnosis not present

## 2021-05-15 DIAGNOSIS — F329 Major depressive disorder, single episode, unspecified: Secondary | ICD-10-CM | POA: Diagnosis not present

## 2021-05-15 DIAGNOSIS — M6281 Muscle weakness (generalized): Secondary | ICD-10-CM | POA: Diagnosis not present

## 2021-05-15 DIAGNOSIS — J449 Chronic obstructive pulmonary disease, unspecified: Secondary | ICD-10-CM | POA: Diagnosis not present

## 2021-05-15 DIAGNOSIS — I10 Essential (primary) hypertension: Secondary | ICD-10-CM | POA: Diagnosis not present

## 2021-05-15 DIAGNOSIS — H409 Unspecified glaucoma: Secondary | ICD-10-CM | POA: Diagnosis not present

## 2021-05-15 DIAGNOSIS — I2510 Atherosclerotic heart disease of native coronary artery without angina pectoris: Secondary | ICD-10-CM | POA: Diagnosis not present

## 2021-05-15 DIAGNOSIS — S22080D Wedge compression fracture of T11-T12 vertebra, subsequent encounter for fracture with routine healing: Secondary | ICD-10-CM | POA: Diagnosis not present

## 2021-05-15 DIAGNOSIS — I4891 Unspecified atrial fibrillation: Secondary | ICD-10-CM | POA: Diagnosis not present

## 2021-05-15 DIAGNOSIS — R791 Abnormal coagulation profile: Secondary | ICD-10-CM | POA: Diagnosis not present

## 2021-05-15 DIAGNOSIS — S22080A Wedge compression fracture of T11-T12 vertebra, initial encounter for closed fracture: Secondary | ICD-10-CM | POA: Diagnosis not present

## 2021-05-16 DIAGNOSIS — R791 Abnormal coagulation profile: Secondary | ICD-10-CM | POA: Diagnosis not present

## 2021-05-16 DIAGNOSIS — I4891 Unspecified atrial fibrillation: Secondary | ICD-10-CM | POA: Diagnosis not present

## 2021-05-16 DIAGNOSIS — R0989 Other specified symptoms and signs involving the circulatory and respiratory systems: Secondary | ICD-10-CM | POA: Diagnosis not present

## 2021-05-16 DIAGNOSIS — R5381 Other malaise: Secondary | ICD-10-CM | POA: Diagnosis not present

## 2021-05-19 DIAGNOSIS — Z7901 Long term (current) use of anticoagulants: Secondary | ICD-10-CM | POA: Diagnosis not present

## 2021-05-19 DIAGNOSIS — H8190 Unspecified disorder of vestibular function, unspecified ear: Secondary | ICD-10-CM | POA: Diagnosis not present

## 2021-05-19 DIAGNOSIS — I340 Nonrheumatic mitral (valve) insufficiency: Secondary | ICD-10-CM | POA: Diagnosis not present

## 2021-05-19 DIAGNOSIS — S22080A Wedge compression fracture of T11-T12 vertebra, initial encounter for closed fracture: Secondary | ICD-10-CM | POA: Diagnosis not present

## 2021-05-22 DIAGNOSIS — I2510 Atherosclerotic heart disease of native coronary artery without angina pectoris: Secondary | ICD-10-CM | POA: Diagnosis not present

## 2021-05-22 DIAGNOSIS — J449 Chronic obstructive pulmonary disease, unspecified: Secondary | ICD-10-CM | POA: Diagnosis not present

## 2021-05-22 DIAGNOSIS — M6281 Muscle weakness (generalized): Secondary | ICD-10-CM | POA: Diagnosis not present

## 2021-05-22 DIAGNOSIS — I4891 Unspecified atrial fibrillation: Secondary | ICD-10-CM | POA: Diagnosis not present

## 2021-05-22 DIAGNOSIS — R2681 Unsteadiness on feet: Secondary | ICD-10-CM | POA: Diagnosis not present

## 2021-05-22 DIAGNOSIS — F329 Major depressive disorder, single episode, unspecified: Secondary | ICD-10-CM | POA: Diagnosis not present

## 2021-05-22 DIAGNOSIS — S22080D Wedge compression fracture of T11-T12 vertebra, subsequent encounter for fracture with routine healing: Secondary | ICD-10-CM | POA: Diagnosis not present

## 2021-05-22 DIAGNOSIS — H409 Unspecified glaucoma: Secondary | ICD-10-CM | POA: Diagnosis not present

## 2021-05-22 DIAGNOSIS — I10 Essential (primary) hypertension: Secondary | ICD-10-CM | POA: Diagnosis not present

## 2021-05-26 DIAGNOSIS — H409 Unspecified glaucoma: Secondary | ICD-10-CM | POA: Diagnosis not present

## 2021-05-26 DIAGNOSIS — J449 Chronic obstructive pulmonary disease, unspecified: Secondary | ICD-10-CM | POA: Diagnosis not present

## 2021-05-26 DIAGNOSIS — I10 Essential (primary) hypertension: Secondary | ICD-10-CM | POA: Diagnosis not present

## 2021-05-26 DIAGNOSIS — M6281 Muscle weakness (generalized): Secondary | ICD-10-CM | POA: Diagnosis not present

## 2021-05-26 DIAGNOSIS — I4891 Unspecified atrial fibrillation: Secondary | ICD-10-CM | POA: Diagnosis not present

## 2021-05-26 DIAGNOSIS — F329 Major depressive disorder, single episode, unspecified: Secondary | ICD-10-CM | POA: Diagnosis not present

## 2021-05-26 DIAGNOSIS — R2681 Unsteadiness on feet: Secondary | ICD-10-CM | POA: Diagnosis not present

## 2021-05-26 DIAGNOSIS — I2510 Atherosclerotic heart disease of native coronary artery without angina pectoris: Secondary | ICD-10-CM | POA: Diagnosis not present

## 2021-05-26 DIAGNOSIS — S22080D Wedge compression fracture of T11-T12 vertebra, subsequent encounter for fracture with routine healing: Secondary | ICD-10-CM | POA: Diagnosis not present

## 2021-05-30 DIAGNOSIS — I4891 Unspecified atrial fibrillation: Secondary | ICD-10-CM | POA: Diagnosis not present

## 2021-05-30 DIAGNOSIS — Z7901 Long term (current) use of anticoagulants: Secondary | ICD-10-CM | POA: Diagnosis not present

## 2021-05-31 DIAGNOSIS — J449 Chronic obstructive pulmonary disease, unspecified: Secondary | ICD-10-CM | POA: Diagnosis not present

## 2021-05-31 DIAGNOSIS — R2681 Unsteadiness on feet: Secondary | ICD-10-CM | POA: Diagnosis not present

## 2021-05-31 DIAGNOSIS — M6281 Muscle weakness (generalized): Secondary | ICD-10-CM | POA: Diagnosis not present

## 2021-05-31 DIAGNOSIS — H409 Unspecified glaucoma: Secondary | ICD-10-CM | POA: Diagnosis not present

## 2021-05-31 DIAGNOSIS — I2510 Atherosclerotic heart disease of native coronary artery without angina pectoris: Secondary | ICD-10-CM | POA: Diagnosis not present

## 2021-05-31 DIAGNOSIS — F329 Major depressive disorder, single episode, unspecified: Secondary | ICD-10-CM | POA: Diagnosis not present

## 2021-05-31 DIAGNOSIS — S22080D Wedge compression fracture of T11-T12 vertebra, subsequent encounter for fracture with routine healing: Secondary | ICD-10-CM | POA: Diagnosis not present

## 2021-05-31 DIAGNOSIS — I10 Essential (primary) hypertension: Secondary | ICD-10-CM | POA: Diagnosis not present

## 2021-05-31 DIAGNOSIS — I4891 Unspecified atrial fibrillation: Secondary | ICD-10-CM | POA: Diagnosis not present

## 2021-06-02 DIAGNOSIS — H409 Unspecified glaucoma: Secondary | ICD-10-CM | POA: Diagnosis not present

## 2021-06-02 DIAGNOSIS — J449 Chronic obstructive pulmonary disease, unspecified: Secondary | ICD-10-CM | POA: Diagnosis not present

## 2021-06-02 DIAGNOSIS — F329 Major depressive disorder, single episode, unspecified: Secondary | ICD-10-CM | POA: Diagnosis not present

## 2021-06-02 DIAGNOSIS — S22080D Wedge compression fracture of T11-T12 vertebra, subsequent encounter for fracture with routine healing: Secondary | ICD-10-CM | POA: Diagnosis not present

## 2021-06-02 DIAGNOSIS — R2681 Unsteadiness on feet: Secondary | ICD-10-CM | POA: Diagnosis not present

## 2021-06-02 DIAGNOSIS — I4891 Unspecified atrial fibrillation: Secondary | ICD-10-CM | POA: Diagnosis not present

## 2021-06-02 DIAGNOSIS — I2510 Atherosclerotic heart disease of native coronary artery without angina pectoris: Secondary | ICD-10-CM | POA: Diagnosis not present

## 2021-06-02 DIAGNOSIS — I10 Essential (primary) hypertension: Secondary | ICD-10-CM | POA: Diagnosis not present

## 2021-06-02 DIAGNOSIS — K130 Diseases of lips: Secondary | ICD-10-CM | POA: Diagnosis not present

## 2021-06-02 DIAGNOSIS — S22089A Unspecified fracture of T11-T12 vertebra, initial encounter for closed fracture: Secondary | ICD-10-CM | POA: Diagnosis not present

## 2021-06-02 DIAGNOSIS — M6281 Muscle weakness (generalized): Secondary | ICD-10-CM | POA: Diagnosis not present

## 2021-06-04 DIAGNOSIS — J449 Chronic obstructive pulmonary disease, unspecified: Secondary | ICD-10-CM | POA: Diagnosis not present

## 2021-06-04 DIAGNOSIS — I10 Essential (primary) hypertension: Secondary | ICD-10-CM | POA: Diagnosis not present

## 2021-06-04 DIAGNOSIS — E785 Hyperlipidemia, unspecified: Secondary | ICD-10-CM | POA: Diagnosis not present

## 2021-06-04 DIAGNOSIS — E1151 Type 2 diabetes mellitus with diabetic peripheral angiopathy without gangrene: Secondary | ICD-10-CM | POA: Diagnosis not present

## 2021-06-05 DIAGNOSIS — Z7901 Long term (current) use of anticoagulants: Secondary | ICD-10-CM | POA: Diagnosis not present

## 2021-06-05 DIAGNOSIS — S22080D Wedge compression fracture of T11-T12 vertebra, subsequent encounter for fracture with routine healing: Secondary | ICD-10-CM | POA: Diagnosis not present

## 2021-06-05 DIAGNOSIS — E118 Type 2 diabetes mellitus with unspecified complications: Secondary | ICD-10-CM | POA: Diagnosis not present

## 2021-06-06 DIAGNOSIS — E1159 Type 2 diabetes mellitus with other circulatory complications: Secondary | ICD-10-CM | POA: Diagnosis not present

## 2021-06-06 DIAGNOSIS — I152 Hypertension secondary to endocrine disorders: Secondary | ICD-10-CM | POA: Diagnosis not present

## 2021-06-06 DIAGNOSIS — J449 Chronic obstructive pulmonary disease, unspecified: Secondary | ICD-10-CM | POA: Diagnosis not present

## 2021-06-06 DIAGNOSIS — I340 Nonrheumatic mitral (valve) insufficiency: Secondary | ICD-10-CM | POA: Diagnosis not present

## 2021-06-06 DIAGNOSIS — S22080D Wedge compression fracture of T11-T12 vertebra, subsequent encounter for fracture with routine healing: Secondary | ICD-10-CM | POA: Diagnosis not present

## 2021-06-06 DIAGNOSIS — E871 Hypo-osmolality and hyponatremia: Secondary | ICD-10-CM | POA: Diagnosis not present

## 2021-06-06 DIAGNOSIS — H8190 Unspecified disorder of vestibular function, unspecified ear: Secondary | ICD-10-CM | POA: Diagnosis not present

## 2021-06-06 DIAGNOSIS — F39 Unspecified mood [affective] disorder: Secondary | ICD-10-CM | POA: Diagnosis not present

## 2021-06-06 DIAGNOSIS — S22080A Wedge compression fracture of T11-T12 vertebra, initial encounter for closed fracture: Secondary | ICD-10-CM | POA: Diagnosis not present

## 2021-06-07 DIAGNOSIS — M6281 Muscle weakness (generalized): Secondary | ICD-10-CM | POA: Diagnosis not present

## 2021-06-07 DIAGNOSIS — R2681 Unsteadiness on feet: Secondary | ICD-10-CM | POA: Diagnosis not present

## 2021-06-07 DIAGNOSIS — H409 Unspecified glaucoma: Secondary | ICD-10-CM | POA: Diagnosis not present

## 2021-06-07 DIAGNOSIS — I10 Essential (primary) hypertension: Secondary | ICD-10-CM | POA: Diagnosis not present

## 2021-06-07 DIAGNOSIS — F329 Major depressive disorder, single episode, unspecified: Secondary | ICD-10-CM | POA: Diagnosis not present

## 2021-06-07 DIAGNOSIS — I4891 Unspecified atrial fibrillation: Secondary | ICD-10-CM | POA: Diagnosis not present

## 2021-06-07 DIAGNOSIS — I2510 Atherosclerotic heart disease of native coronary artery without angina pectoris: Secondary | ICD-10-CM | POA: Diagnosis not present

## 2021-06-07 DIAGNOSIS — J449 Chronic obstructive pulmonary disease, unspecified: Secondary | ICD-10-CM | POA: Diagnosis not present

## 2021-06-07 DIAGNOSIS — S22080D Wedge compression fracture of T11-T12 vertebra, subsequent encounter for fracture with routine healing: Secondary | ICD-10-CM | POA: Diagnosis not present

## 2021-06-10 DIAGNOSIS — E871 Hypo-osmolality and hyponatremia: Secondary | ICD-10-CM | POA: Diagnosis not present

## 2021-06-10 DIAGNOSIS — E1159 Type 2 diabetes mellitus with other circulatory complications: Secondary | ICD-10-CM | POA: Diagnosis not present

## 2021-06-10 DIAGNOSIS — I872 Venous insufficiency (chronic) (peripheral): Secondary | ICD-10-CM | POA: Diagnosis not present

## 2021-06-10 DIAGNOSIS — I152 Hypertension secondary to endocrine disorders: Secondary | ICD-10-CM | POA: Diagnosis not present

## 2021-06-10 DIAGNOSIS — E1151 Type 2 diabetes mellitus with diabetic peripheral angiopathy without gangrene: Secondary | ICD-10-CM | POA: Diagnosis not present

## 2021-06-10 DIAGNOSIS — H8190 Unspecified disorder of vestibular function, unspecified ear: Secondary | ICD-10-CM | POA: Diagnosis not present

## 2021-06-10 DIAGNOSIS — B9620 Unspecified Escherichia coli [E. coli] as the cause of diseases classified elsewhere: Secondary | ICD-10-CM | POA: Diagnosis not present

## 2021-06-10 DIAGNOSIS — S22080D Wedge compression fracture of T11-T12 vertebra, subsequent encounter for fracture with routine healing: Secondary | ICD-10-CM | POA: Diagnosis not present

## 2021-06-10 DIAGNOSIS — N390 Urinary tract infection, site not specified: Secondary | ICD-10-CM | POA: Diagnosis not present

## 2021-06-11 ENCOUNTER — Ambulatory Visit: Payer: Medicare HMO | Admitting: Podiatry

## 2021-06-11 ENCOUNTER — Encounter: Payer: Self-pay | Admitting: Podiatry

## 2021-06-11 ENCOUNTER — Other Ambulatory Visit: Payer: Self-pay

## 2021-06-11 DIAGNOSIS — G629 Polyneuropathy, unspecified: Secondary | ICD-10-CM | POA: Diagnosis not present

## 2021-06-11 DIAGNOSIS — E1140 Type 2 diabetes mellitus with diabetic neuropathy, unspecified: Secondary | ICD-10-CM | POA: Diagnosis not present

## 2021-06-11 DIAGNOSIS — B351 Tinea unguium: Secondary | ICD-10-CM | POA: Diagnosis not present

## 2021-06-11 DIAGNOSIS — M79675 Pain in left toe(s): Secondary | ICD-10-CM | POA: Diagnosis not present

## 2021-06-11 DIAGNOSIS — M79674 Pain in right toe(s): Secondary | ICD-10-CM

## 2021-06-11 NOTE — Progress Notes (Signed)
This patient returns to my office for at risk foot care.  This patient requires this care by a professional since this patient will be at risk due to having diabetes with neuropathy and coagulation defect.  She presents to the office in a wheelchair due to back stress fracture.  She is accompanied by her husband.This patient is unable to cut nails herself since the patient cannot reach her nails.These nails are painful walking and wearing shoes.  This patient presents for at risk foot care today.  General Appearance  Alert, conversant and in no acute stress.  Vascular  Dorsalis pedis and posterior tibial  pulses are weakly  palpable  bilaterally.  Capillary return is within normal limits  bilaterally. Cold feet bilaterally. Absent digital hair  B/L.  Neurologic  Senn-Weinstein monofilament wire test diminished  limits  bilaterally. Muscle power within normal limits bilaterally.  Nails Thick disfigured discolored nails with subungual debris  from hallux to fifth toes bilaterally. No evidence of bacterial infection or drainage bilaterally.  Orthopedic  No limitations of motion  feet .  No crepitus or effusions noted.  No bony pathology or digital deformities noted.  Skin  normotropic skin with no porokeratosis noted bilaterally.  No signs of infections or ulcers noted.     Onychomycosis  Pain in right toes  Pain in left toes  Consent was obtained for treatment procedures.   Mechanical debridement of nails 1-5  bilaterally performed with a nail nipper.  Filed with dremel without incident.    Return office visit  4 months.                    Told patient to return for periodic foot care and evaluation due to potential at risk complications.   Gardiner Barefoot DPM

## 2021-06-13 DIAGNOSIS — H8190 Unspecified disorder of vestibular function, unspecified ear: Secondary | ICD-10-CM | POA: Diagnosis not present

## 2021-06-13 DIAGNOSIS — I872 Venous insufficiency (chronic) (peripheral): Secondary | ICD-10-CM | POA: Diagnosis not present

## 2021-06-13 DIAGNOSIS — E1159 Type 2 diabetes mellitus with other circulatory complications: Secondary | ICD-10-CM | POA: Diagnosis not present

## 2021-06-13 DIAGNOSIS — E1151 Type 2 diabetes mellitus with diabetic peripheral angiopathy without gangrene: Secondary | ICD-10-CM | POA: Diagnosis not present

## 2021-06-13 DIAGNOSIS — I152 Hypertension secondary to endocrine disorders: Secondary | ICD-10-CM | POA: Diagnosis not present

## 2021-06-13 DIAGNOSIS — B9620 Unspecified Escherichia coli [E. coli] as the cause of diseases classified elsewhere: Secondary | ICD-10-CM | POA: Diagnosis not present

## 2021-06-13 DIAGNOSIS — E871 Hypo-osmolality and hyponatremia: Secondary | ICD-10-CM | POA: Diagnosis not present

## 2021-06-13 DIAGNOSIS — N390 Urinary tract infection, site not specified: Secondary | ICD-10-CM | POA: Diagnosis not present

## 2021-06-13 DIAGNOSIS — S22080D Wedge compression fracture of T11-T12 vertebra, subsequent encounter for fracture with routine healing: Secondary | ICD-10-CM | POA: Diagnosis not present

## 2021-06-17 DIAGNOSIS — E1159 Type 2 diabetes mellitus with other circulatory complications: Secondary | ICD-10-CM | POA: Diagnosis not present

## 2021-06-17 DIAGNOSIS — E871 Hypo-osmolality and hyponatremia: Secondary | ICD-10-CM | POA: Diagnosis not present

## 2021-06-17 DIAGNOSIS — N390 Urinary tract infection, site not specified: Secondary | ICD-10-CM | POA: Diagnosis not present

## 2021-06-17 DIAGNOSIS — S22080D Wedge compression fracture of T11-T12 vertebra, subsequent encounter for fracture with routine healing: Secondary | ICD-10-CM | POA: Diagnosis not present

## 2021-06-17 DIAGNOSIS — I152 Hypertension secondary to endocrine disorders: Secondary | ICD-10-CM | POA: Diagnosis not present

## 2021-06-17 DIAGNOSIS — H8190 Unspecified disorder of vestibular function, unspecified ear: Secondary | ICD-10-CM | POA: Diagnosis not present

## 2021-06-17 DIAGNOSIS — B9620 Unspecified Escherichia coli [E. coli] as the cause of diseases classified elsewhere: Secondary | ICD-10-CM | POA: Diagnosis not present

## 2021-06-17 DIAGNOSIS — E1151 Type 2 diabetes mellitus with diabetic peripheral angiopathy without gangrene: Secondary | ICD-10-CM | POA: Diagnosis not present

## 2021-06-17 DIAGNOSIS — I872 Venous insufficiency (chronic) (peripheral): Secondary | ICD-10-CM | POA: Diagnosis not present

## 2021-06-19 DIAGNOSIS — R2681 Unsteadiness on feet: Secondary | ICD-10-CM | POA: Diagnosis not present

## 2021-06-19 DIAGNOSIS — Z7901 Long term (current) use of anticoagulants: Secondary | ICD-10-CM | POA: Diagnosis not present

## 2021-06-19 DIAGNOSIS — E871 Hypo-osmolality and hyponatremia: Secondary | ICD-10-CM | POA: Diagnosis not present

## 2021-06-19 DIAGNOSIS — E785 Hyperlipidemia, unspecified: Secondary | ICD-10-CM | POA: Diagnosis not present

## 2021-06-19 DIAGNOSIS — I4821 Permanent atrial fibrillation: Secondary | ICD-10-CM | POA: Diagnosis not present

## 2021-06-19 DIAGNOSIS — S22080A Wedge compression fracture of T11-T12 vertebra, initial encounter for closed fracture: Secondary | ICD-10-CM | POA: Diagnosis not present

## 2021-06-19 DIAGNOSIS — E1151 Type 2 diabetes mellitus with diabetic peripheral angiopathy without gangrene: Secondary | ICD-10-CM | POA: Diagnosis not present

## 2021-06-19 DIAGNOSIS — J449 Chronic obstructive pulmonary disease, unspecified: Secondary | ICD-10-CM | POA: Diagnosis not present

## 2021-06-19 DIAGNOSIS — M8000XA Age-related osteoporosis with current pathological fracture, unspecified site, initial encounter for fracture: Secondary | ICD-10-CM | POA: Diagnosis not present

## 2021-06-23 DIAGNOSIS — H401132 Primary open-angle glaucoma, bilateral, moderate stage: Secondary | ICD-10-CM | POA: Diagnosis not present

## 2021-06-24 DIAGNOSIS — I152 Hypertension secondary to endocrine disorders: Secondary | ICD-10-CM | POA: Diagnosis not present

## 2021-06-24 DIAGNOSIS — E1151 Type 2 diabetes mellitus with diabetic peripheral angiopathy without gangrene: Secondary | ICD-10-CM | POA: Diagnosis not present

## 2021-06-24 DIAGNOSIS — E1159 Type 2 diabetes mellitus with other circulatory complications: Secondary | ICD-10-CM | POA: Diagnosis not present

## 2021-06-24 DIAGNOSIS — S22080D Wedge compression fracture of T11-T12 vertebra, subsequent encounter for fracture with routine healing: Secondary | ICD-10-CM | POA: Diagnosis not present

## 2021-06-24 DIAGNOSIS — N390 Urinary tract infection, site not specified: Secondary | ICD-10-CM | POA: Diagnosis not present

## 2021-06-24 DIAGNOSIS — I872 Venous insufficiency (chronic) (peripheral): Secondary | ICD-10-CM | POA: Diagnosis not present

## 2021-06-24 DIAGNOSIS — H8190 Unspecified disorder of vestibular function, unspecified ear: Secondary | ICD-10-CM | POA: Diagnosis not present

## 2021-06-24 DIAGNOSIS — E871 Hypo-osmolality and hyponatremia: Secondary | ICD-10-CM | POA: Diagnosis not present

## 2021-06-24 DIAGNOSIS — B9620 Unspecified Escherichia coli [E. coli] as the cause of diseases classified elsewhere: Secondary | ICD-10-CM | POA: Diagnosis not present

## 2021-06-26 DIAGNOSIS — E1151 Type 2 diabetes mellitus with diabetic peripheral angiopathy without gangrene: Secondary | ICD-10-CM | POA: Diagnosis not present

## 2021-06-26 DIAGNOSIS — S22080D Wedge compression fracture of T11-T12 vertebra, subsequent encounter for fracture with routine healing: Secondary | ICD-10-CM | POA: Diagnosis not present

## 2021-06-26 DIAGNOSIS — I872 Venous insufficiency (chronic) (peripheral): Secondary | ICD-10-CM | POA: Diagnosis not present

## 2021-06-26 DIAGNOSIS — E871 Hypo-osmolality and hyponatremia: Secondary | ICD-10-CM | POA: Diagnosis not present

## 2021-06-26 DIAGNOSIS — N390 Urinary tract infection, site not specified: Secondary | ICD-10-CM | POA: Diagnosis not present

## 2021-06-26 DIAGNOSIS — E1159 Type 2 diabetes mellitus with other circulatory complications: Secondary | ICD-10-CM | POA: Diagnosis not present

## 2021-06-26 DIAGNOSIS — B9620 Unspecified Escherichia coli [E. coli] as the cause of diseases classified elsewhere: Secondary | ICD-10-CM | POA: Diagnosis not present

## 2021-06-26 DIAGNOSIS — H8190 Unspecified disorder of vestibular function, unspecified ear: Secondary | ICD-10-CM | POA: Diagnosis not present

## 2021-06-26 DIAGNOSIS — I152 Hypertension secondary to endocrine disorders: Secondary | ICD-10-CM | POA: Diagnosis not present

## 2021-06-27 DIAGNOSIS — I10 Essential (primary) hypertension: Secondary | ICD-10-CM | POA: Diagnosis not present

## 2021-06-27 DIAGNOSIS — K219 Gastro-esophageal reflux disease without esophagitis: Secondary | ICD-10-CM | POA: Diagnosis not present

## 2021-06-27 DIAGNOSIS — F329 Major depressive disorder, single episode, unspecified: Secondary | ICD-10-CM | POA: Diagnosis not present

## 2021-06-27 DIAGNOSIS — D72829 Elevated white blood cell count, unspecified: Secondary | ICD-10-CM | POA: Diagnosis not present

## 2021-06-27 DIAGNOSIS — E119 Type 2 diabetes mellitus without complications: Secondary | ICD-10-CM | POA: Diagnosis not present

## 2021-06-27 DIAGNOSIS — E1151 Type 2 diabetes mellitus with diabetic peripheral angiopathy without gangrene: Secondary | ICD-10-CM | POA: Diagnosis not present

## 2021-06-27 DIAGNOSIS — F419 Anxiety disorder, unspecified: Secondary | ICD-10-CM | POA: Diagnosis not present

## 2021-06-27 DIAGNOSIS — D692 Other nonthrombocytopenic purpura: Secondary | ICD-10-CM | POA: Diagnosis not present

## 2021-06-27 DIAGNOSIS — J449 Chronic obstructive pulmonary disease, unspecified: Secondary | ICD-10-CM | POA: Diagnosis not present

## 2021-06-27 DIAGNOSIS — R110 Nausea: Secondary | ICD-10-CM | POA: Diagnosis not present

## 2021-06-30 DIAGNOSIS — N390 Urinary tract infection, site not specified: Secondary | ICD-10-CM | POA: Diagnosis not present

## 2021-07-02 DIAGNOSIS — I4821 Permanent atrial fibrillation: Secondary | ICD-10-CM | POA: Diagnosis not present

## 2021-07-02 DIAGNOSIS — Z7901 Long term (current) use of anticoagulants: Secondary | ICD-10-CM | POA: Diagnosis not present

## 2021-07-02 DIAGNOSIS — N390 Urinary tract infection, site not specified: Secondary | ICD-10-CM | POA: Diagnosis not present

## 2021-07-03 DIAGNOSIS — E1151 Type 2 diabetes mellitus with diabetic peripheral angiopathy without gangrene: Secondary | ICD-10-CM | POA: Diagnosis not present

## 2021-07-03 DIAGNOSIS — S22080D Wedge compression fracture of T11-T12 vertebra, subsequent encounter for fracture with routine healing: Secondary | ICD-10-CM | POA: Diagnosis not present

## 2021-07-03 DIAGNOSIS — H8190 Unspecified disorder of vestibular function, unspecified ear: Secondary | ICD-10-CM | POA: Diagnosis not present

## 2021-07-03 DIAGNOSIS — E1159 Type 2 diabetes mellitus with other circulatory complications: Secondary | ICD-10-CM | POA: Diagnosis not present

## 2021-07-03 DIAGNOSIS — N390 Urinary tract infection, site not specified: Secondary | ICD-10-CM | POA: Diagnosis not present

## 2021-07-03 DIAGNOSIS — I872 Venous insufficiency (chronic) (peripheral): Secondary | ICD-10-CM | POA: Diagnosis not present

## 2021-07-03 DIAGNOSIS — B9620 Unspecified Escherichia coli [E. coli] as the cause of diseases classified elsewhere: Secondary | ICD-10-CM | POA: Diagnosis not present

## 2021-07-03 DIAGNOSIS — E871 Hypo-osmolality and hyponatremia: Secondary | ICD-10-CM | POA: Diagnosis not present

## 2021-07-03 DIAGNOSIS — I152 Hypertension secondary to endocrine disorders: Secondary | ICD-10-CM | POA: Diagnosis not present

## 2021-07-04 DIAGNOSIS — H8190 Unspecified disorder of vestibular function, unspecified ear: Secondary | ICD-10-CM | POA: Diagnosis not present

## 2021-07-04 DIAGNOSIS — F329 Major depressive disorder, single episode, unspecified: Secondary | ICD-10-CM | POA: Diagnosis not present

## 2021-07-04 DIAGNOSIS — E1151 Type 2 diabetes mellitus with diabetic peripheral angiopathy without gangrene: Secondary | ICD-10-CM | POA: Diagnosis not present

## 2021-07-04 DIAGNOSIS — N390 Urinary tract infection, site not specified: Secondary | ICD-10-CM | POA: Diagnosis not present

## 2021-07-04 DIAGNOSIS — E119 Type 2 diabetes mellitus without complications: Secondary | ICD-10-CM | POA: Diagnosis not present

## 2021-07-04 DIAGNOSIS — S22080D Wedge compression fracture of T11-T12 vertebra, subsequent encounter for fracture with routine healing: Secondary | ICD-10-CM | POA: Diagnosis not present

## 2021-07-04 DIAGNOSIS — J449 Chronic obstructive pulmonary disease, unspecified: Secondary | ICD-10-CM | POA: Diagnosis not present

## 2021-07-04 DIAGNOSIS — B9620 Unspecified Escherichia coli [E. coli] as the cause of diseases classified elsewhere: Secondary | ICD-10-CM | POA: Diagnosis not present

## 2021-07-04 DIAGNOSIS — I10 Essential (primary) hypertension: Secondary | ICD-10-CM | POA: Diagnosis not present

## 2021-07-04 DIAGNOSIS — I152 Hypertension secondary to endocrine disorders: Secondary | ICD-10-CM | POA: Diagnosis not present

## 2021-07-04 DIAGNOSIS — E1159 Type 2 diabetes mellitus with other circulatory complications: Secondary | ICD-10-CM | POA: Diagnosis not present

## 2021-07-04 DIAGNOSIS — E871 Hypo-osmolality and hyponatremia: Secondary | ICD-10-CM | POA: Diagnosis not present

## 2021-07-04 DIAGNOSIS — I872 Venous insufficiency (chronic) (peripheral): Secondary | ICD-10-CM | POA: Diagnosis not present

## 2021-07-04 DIAGNOSIS — F419 Anxiety disorder, unspecified: Secondary | ICD-10-CM | POA: Diagnosis not present

## 2021-07-06 DIAGNOSIS — J449 Chronic obstructive pulmonary disease, unspecified: Secondary | ICD-10-CM | POA: Diagnosis not present

## 2021-07-06 DIAGNOSIS — S22080D Wedge compression fracture of T11-T12 vertebra, subsequent encounter for fracture with routine healing: Secondary | ICD-10-CM | POA: Diagnosis not present

## 2021-07-08 DIAGNOSIS — E871 Hypo-osmolality and hyponatremia: Secondary | ICD-10-CM | POA: Diagnosis not present

## 2021-07-08 DIAGNOSIS — S22080D Wedge compression fracture of T11-T12 vertebra, subsequent encounter for fracture with routine healing: Secondary | ICD-10-CM | POA: Diagnosis not present

## 2021-07-08 DIAGNOSIS — B9620 Unspecified Escherichia coli [E. coli] as the cause of diseases classified elsewhere: Secondary | ICD-10-CM | POA: Diagnosis not present

## 2021-07-08 DIAGNOSIS — E1151 Type 2 diabetes mellitus with diabetic peripheral angiopathy without gangrene: Secondary | ICD-10-CM | POA: Diagnosis not present

## 2021-07-08 DIAGNOSIS — I152 Hypertension secondary to endocrine disorders: Secondary | ICD-10-CM | POA: Diagnosis not present

## 2021-07-08 DIAGNOSIS — N390 Urinary tract infection, site not specified: Secondary | ICD-10-CM | POA: Diagnosis not present

## 2021-07-08 DIAGNOSIS — H8190 Unspecified disorder of vestibular function, unspecified ear: Secondary | ICD-10-CM | POA: Diagnosis not present

## 2021-07-08 DIAGNOSIS — E1159 Type 2 diabetes mellitus with other circulatory complications: Secondary | ICD-10-CM | POA: Diagnosis not present

## 2021-07-08 DIAGNOSIS — I872 Venous insufficiency (chronic) (peripheral): Secondary | ICD-10-CM | POA: Diagnosis not present

## 2021-07-09 ENCOUNTER — Other Ambulatory Visit (HOSPITAL_COMMUNITY): Payer: Self-pay | Admitting: *Deleted

## 2021-07-10 ENCOUNTER — Ambulatory Visit (HOSPITAL_COMMUNITY)
Admission: RE | Admit: 2021-07-10 | Discharge: 2021-07-10 | Disposition: A | Discharge status: Home / Self Care | Payer: Medicare HMO | Source: Ambulatory Visit | Attending: Internal Medicine | Admitting: Internal Medicine

## 2021-07-10 DIAGNOSIS — S22080D Wedge compression fracture of T11-T12 vertebra, subsequent encounter for fracture with routine healing: Secondary | ICD-10-CM | POA: Diagnosis not present

## 2021-07-10 DIAGNOSIS — E871 Hypo-osmolality and hyponatremia: Secondary | ICD-10-CM | POA: Diagnosis not present

## 2021-07-10 DIAGNOSIS — B9620 Unspecified Escherichia coli [E. coli] as the cause of diseases classified elsewhere: Secondary | ICD-10-CM | POA: Diagnosis not present

## 2021-07-10 DIAGNOSIS — N390 Urinary tract infection, site not specified: Secondary | ICD-10-CM | POA: Diagnosis not present

## 2021-07-10 DIAGNOSIS — E1151 Type 2 diabetes mellitus with diabetic peripheral angiopathy without gangrene: Secondary | ICD-10-CM | POA: Diagnosis not present

## 2021-07-10 DIAGNOSIS — H8190 Unspecified disorder of vestibular function, unspecified ear: Secondary | ICD-10-CM | POA: Diagnosis not present

## 2021-07-10 DIAGNOSIS — M810 Age-related osteoporosis without current pathological fracture: Secondary | ICD-10-CM | POA: Diagnosis not present

## 2021-07-10 DIAGNOSIS — I872 Venous insufficiency (chronic) (peripheral): Secondary | ICD-10-CM | POA: Diagnosis not present

## 2021-07-10 DIAGNOSIS — I152 Hypertension secondary to endocrine disorders: Secondary | ICD-10-CM | POA: Diagnosis not present

## 2021-07-10 DIAGNOSIS — E1159 Type 2 diabetes mellitus with other circulatory complications: Secondary | ICD-10-CM | POA: Diagnosis not present

## 2021-07-10 MED ORDER — DENOSUMAB 60 MG/ML ~~LOC~~ SOSY
PREFILLED_SYRINGE | SUBCUTANEOUS | Status: AC
  Administered 2021-07-10: 60 mg via SUBCUTANEOUS
  Filled 2021-07-10: qty 1

## 2021-07-10 MED ORDER — DENOSUMAB 60 MG/ML ~~LOC~~ SOSY: 60.0000 mg | PREFILLED_SYRINGE | Freq: Once | SUBCUTANEOUS | Status: AC

## 2021-07-14 DIAGNOSIS — S22089A Unspecified fracture of T11-T12 vertebra, initial encounter for closed fracture: Secondary | ICD-10-CM | POA: Diagnosis not present

## 2021-07-15 DIAGNOSIS — N390 Urinary tract infection, site not specified: Secondary | ICD-10-CM | POA: Diagnosis not present

## 2021-07-15 DIAGNOSIS — E871 Hypo-osmolality and hyponatremia: Secondary | ICD-10-CM | POA: Diagnosis not present

## 2021-07-15 DIAGNOSIS — H8190 Unspecified disorder of vestibular function, unspecified ear: Secondary | ICD-10-CM | POA: Diagnosis not present

## 2021-07-15 DIAGNOSIS — E1151 Type 2 diabetes mellitus with diabetic peripheral angiopathy without gangrene: Secondary | ICD-10-CM | POA: Diagnosis not present

## 2021-07-15 DIAGNOSIS — E1159 Type 2 diabetes mellitus with other circulatory complications: Secondary | ICD-10-CM | POA: Diagnosis not present

## 2021-07-15 DIAGNOSIS — S22080D Wedge compression fracture of T11-T12 vertebra, subsequent encounter for fracture with routine healing: Secondary | ICD-10-CM | POA: Diagnosis not present

## 2021-07-15 DIAGNOSIS — I152 Hypertension secondary to endocrine disorders: Secondary | ICD-10-CM | POA: Diagnosis not present

## 2021-07-15 DIAGNOSIS — I872 Venous insufficiency (chronic) (peripheral): Secondary | ICD-10-CM | POA: Diagnosis not present

## 2021-07-15 DIAGNOSIS — B9620 Unspecified Escherichia coli [E. coli] as the cause of diseases classified elsewhere: Secondary | ICD-10-CM | POA: Diagnosis not present

## 2021-07-17 DIAGNOSIS — N390 Urinary tract infection, site not specified: Secondary | ICD-10-CM | POA: Diagnosis not present

## 2021-07-17 DIAGNOSIS — I872 Venous insufficiency (chronic) (peripheral): Secondary | ICD-10-CM | POA: Diagnosis not present

## 2021-07-17 DIAGNOSIS — S22080D Wedge compression fracture of T11-T12 vertebra, subsequent encounter for fracture with routine healing: Secondary | ICD-10-CM | POA: Diagnosis not present

## 2021-07-17 DIAGNOSIS — E1159 Type 2 diabetes mellitus with other circulatory complications: Secondary | ICD-10-CM | POA: Diagnosis not present

## 2021-07-17 DIAGNOSIS — B9620 Unspecified Escherichia coli [E. coli] as the cause of diseases classified elsewhere: Secondary | ICD-10-CM | POA: Diagnosis not present

## 2021-07-17 DIAGNOSIS — H8190 Unspecified disorder of vestibular function, unspecified ear: Secondary | ICD-10-CM | POA: Diagnosis not present

## 2021-07-17 DIAGNOSIS — I152 Hypertension secondary to endocrine disorders: Secondary | ICD-10-CM | POA: Diagnosis not present

## 2021-07-17 DIAGNOSIS — E1151 Type 2 diabetes mellitus with diabetic peripheral angiopathy without gangrene: Secondary | ICD-10-CM | POA: Diagnosis not present

## 2021-07-17 DIAGNOSIS — E871 Hypo-osmolality and hyponatremia: Secondary | ICD-10-CM | POA: Diagnosis not present

## 2021-07-22 DIAGNOSIS — N390 Urinary tract infection, site not specified: Secondary | ICD-10-CM | POA: Diagnosis not present

## 2021-07-22 DIAGNOSIS — E1159 Type 2 diabetes mellitus with other circulatory complications: Secondary | ICD-10-CM | POA: Diagnosis not present

## 2021-07-22 DIAGNOSIS — E871 Hypo-osmolality and hyponatremia: Secondary | ICD-10-CM | POA: Diagnosis not present

## 2021-07-22 DIAGNOSIS — H8190 Unspecified disorder of vestibular function, unspecified ear: Secondary | ICD-10-CM | POA: Diagnosis not present

## 2021-07-22 DIAGNOSIS — E1151 Type 2 diabetes mellitus with diabetic peripheral angiopathy without gangrene: Secondary | ICD-10-CM | POA: Diagnosis not present

## 2021-07-22 DIAGNOSIS — I152 Hypertension secondary to endocrine disorders: Secondary | ICD-10-CM | POA: Diagnosis not present

## 2021-07-22 DIAGNOSIS — I4821 Permanent atrial fibrillation: Secondary | ICD-10-CM | POA: Diagnosis not present

## 2021-07-22 DIAGNOSIS — B9620 Unspecified Escherichia coli [E. coli] as the cause of diseases classified elsewhere: Secondary | ICD-10-CM | POA: Diagnosis not present

## 2021-07-22 DIAGNOSIS — I872 Venous insufficiency (chronic) (peripheral): Secondary | ICD-10-CM | POA: Diagnosis not present

## 2021-07-22 DIAGNOSIS — S22080D Wedge compression fracture of T11-T12 vertebra, subsequent encounter for fracture with routine healing: Secondary | ICD-10-CM | POA: Diagnosis not present

## 2021-07-22 DIAGNOSIS — Z7901 Long term (current) use of anticoagulants: Secondary | ICD-10-CM | POA: Diagnosis not present

## 2021-07-24 DIAGNOSIS — F329 Major depressive disorder, single episode, unspecified: Secondary | ICD-10-CM | POA: Diagnosis not present

## 2021-07-24 DIAGNOSIS — J449 Chronic obstructive pulmonary disease, unspecified: Secondary | ICD-10-CM | POA: Diagnosis not present

## 2021-07-24 DIAGNOSIS — F419 Anxiety disorder, unspecified: Secondary | ICD-10-CM | POA: Diagnosis not present

## 2021-07-24 DIAGNOSIS — E119 Type 2 diabetes mellitus without complications: Secondary | ICD-10-CM | POA: Diagnosis not present

## 2021-07-24 DIAGNOSIS — I10 Essential (primary) hypertension: Secondary | ICD-10-CM | POA: Diagnosis not present

## 2021-07-29 DIAGNOSIS — H8190 Unspecified disorder of vestibular function, unspecified ear: Secondary | ICD-10-CM | POA: Diagnosis not present

## 2021-07-29 DIAGNOSIS — N390 Urinary tract infection, site not specified: Secondary | ICD-10-CM | POA: Diagnosis not present

## 2021-07-29 DIAGNOSIS — E1151 Type 2 diabetes mellitus with diabetic peripheral angiopathy without gangrene: Secondary | ICD-10-CM | POA: Diagnosis not present

## 2021-07-29 DIAGNOSIS — E1159 Type 2 diabetes mellitus with other circulatory complications: Secondary | ICD-10-CM | POA: Diagnosis not present

## 2021-07-29 DIAGNOSIS — I152 Hypertension secondary to endocrine disorders: Secondary | ICD-10-CM | POA: Diagnosis not present

## 2021-07-29 DIAGNOSIS — S22080D Wedge compression fracture of T11-T12 vertebra, subsequent encounter for fracture with routine healing: Secondary | ICD-10-CM | POA: Diagnosis not present

## 2021-07-29 DIAGNOSIS — B9620 Unspecified Escherichia coli [E. coli] as the cause of diseases classified elsewhere: Secondary | ICD-10-CM | POA: Diagnosis not present

## 2021-07-29 DIAGNOSIS — E871 Hypo-osmolality and hyponatremia: Secondary | ICD-10-CM | POA: Diagnosis not present

## 2021-07-29 DIAGNOSIS — I872 Venous insufficiency (chronic) (peripheral): Secondary | ICD-10-CM | POA: Diagnosis not present

## 2021-07-31 DIAGNOSIS — L03012 Cellulitis of left finger: Secondary | ICD-10-CM | POA: Diagnosis not present

## 2021-07-31 DIAGNOSIS — L03011 Cellulitis of right finger: Secondary | ICD-10-CM | POA: Diagnosis not present

## 2021-07-31 DIAGNOSIS — B351 Tinea unguium: Secondary | ICD-10-CM | POA: Diagnosis not present

## 2021-08-04 DIAGNOSIS — E785 Hyperlipidemia, unspecified: Secondary | ICD-10-CM | POA: Diagnosis not present

## 2021-08-04 DIAGNOSIS — I10 Essential (primary) hypertension: Secondary | ICD-10-CM | POA: Diagnosis not present

## 2021-08-04 DIAGNOSIS — J449 Chronic obstructive pulmonary disease, unspecified: Secondary | ICD-10-CM | POA: Diagnosis not present

## 2021-08-04 DIAGNOSIS — E1151 Type 2 diabetes mellitus with diabetic peripheral angiopathy without gangrene: Secondary | ICD-10-CM | POA: Diagnosis not present

## 2021-08-05 DIAGNOSIS — H8190 Unspecified disorder of vestibular function, unspecified ear: Secondary | ICD-10-CM | POA: Diagnosis not present

## 2021-08-05 DIAGNOSIS — E871 Hypo-osmolality and hyponatremia: Secondary | ICD-10-CM | POA: Diagnosis not present

## 2021-08-05 DIAGNOSIS — E1159 Type 2 diabetes mellitus with other circulatory complications: Secondary | ICD-10-CM | POA: Diagnosis not present

## 2021-08-05 DIAGNOSIS — I152 Hypertension secondary to endocrine disorders: Secondary | ICD-10-CM | POA: Diagnosis not present

## 2021-08-05 DIAGNOSIS — E1151 Type 2 diabetes mellitus with diabetic peripheral angiopathy without gangrene: Secondary | ICD-10-CM | POA: Diagnosis not present

## 2021-08-05 DIAGNOSIS — B9620 Unspecified Escherichia coli [E. coli] as the cause of diseases classified elsewhere: Secondary | ICD-10-CM | POA: Diagnosis not present

## 2021-08-05 DIAGNOSIS — N390 Urinary tract infection, site not specified: Secondary | ICD-10-CM | POA: Diagnosis not present

## 2021-08-05 DIAGNOSIS — I872 Venous insufficiency (chronic) (peripheral): Secondary | ICD-10-CM | POA: Diagnosis not present

## 2021-08-05 DIAGNOSIS — S22080D Wedge compression fracture of T11-T12 vertebra, subsequent encounter for fracture with routine healing: Secondary | ICD-10-CM | POA: Diagnosis not present

## 2021-08-06 DIAGNOSIS — S22080D Wedge compression fracture of T11-T12 vertebra, subsequent encounter for fracture with routine healing: Secondary | ICD-10-CM | POA: Diagnosis not present

## 2021-08-06 DIAGNOSIS — J449 Chronic obstructive pulmonary disease, unspecified: Secondary | ICD-10-CM | POA: Diagnosis not present

## 2021-08-07 DIAGNOSIS — Z7984 Long term (current) use of oral hypoglycemic drugs: Secondary | ICD-10-CM | POA: Diagnosis not present

## 2021-08-07 DIAGNOSIS — E782 Mixed hyperlipidemia: Secondary | ICD-10-CM | POA: Diagnosis not present

## 2021-08-07 DIAGNOSIS — I10 Essential (primary) hypertension: Secondary | ICD-10-CM | POA: Diagnosis not present

## 2021-08-07 DIAGNOSIS — Z794 Long term (current) use of insulin: Secondary | ICD-10-CM | POA: Diagnosis not present

## 2021-08-07 DIAGNOSIS — E1142 Type 2 diabetes mellitus with diabetic polyneuropathy: Secondary | ICD-10-CM | POA: Diagnosis not present

## 2021-08-09 DIAGNOSIS — S22080D Wedge compression fracture of T11-T12 vertebra, subsequent encounter for fracture with routine healing: Secondary | ICD-10-CM | POA: Diagnosis not present

## 2021-08-09 DIAGNOSIS — E1151 Type 2 diabetes mellitus with diabetic peripheral angiopathy without gangrene: Secondary | ICD-10-CM | POA: Diagnosis not present

## 2021-08-09 DIAGNOSIS — B9620 Unspecified Escherichia coli [E. coli] as the cause of diseases classified elsewhere: Secondary | ICD-10-CM | POA: Diagnosis not present

## 2021-08-09 DIAGNOSIS — E1159 Type 2 diabetes mellitus with other circulatory complications: Secondary | ICD-10-CM | POA: Diagnosis not present

## 2021-08-09 DIAGNOSIS — H8190 Unspecified disorder of vestibular function, unspecified ear: Secondary | ICD-10-CM | POA: Diagnosis not present

## 2021-08-09 DIAGNOSIS — I872 Venous insufficiency (chronic) (peripheral): Secondary | ICD-10-CM | POA: Diagnosis not present

## 2021-08-09 DIAGNOSIS — N390 Urinary tract infection, site not specified: Secondary | ICD-10-CM | POA: Diagnosis not present

## 2021-08-09 DIAGNOSIS — E871 Hypo-osmolality and hyponatremia: Secondary | ICD-10-CM | POA: Diagnosis not present

## 2021-08-09 DIAGNOSIS — I152 Hypertension secondary to endocrine disorders: Secondary | ICD-10-CM | POA: Diagnosis not present

## 2021-08-14 DIAGNOSIS — E1151 Type 2 diabetes mellitus with diabetic peripheral angiopathy without gangrene: Secondary | ICD-10-CM | POA: Diagnosis not present

## 2021-08-14 DIAGNOSIS — I152 Hypertension secondary to endocrine disorders: Secondary | ICD-10-CM | POA: Diagnosis not present

## 2021-08-14 DIAGNOSIS — B9620 Unspecified Escherichia coli [E. coli] as the cause of diseases classified elsewhere: Secondary | ICD-10-CM | POA: Diagnosis not present

## 2021-08-14 DIAGNOSIS — I872 Venous insufficiency (chronic) (peripheral): Secondary | ICD-10-CM | POA: Diagnosis not present

## 2021-08-14 DIAGNOSIS — E871 Hypo-osmolality and hyponatremia: Secondary | ICD-10-CM | POA: Diagnosis not present

## 2021-08-14 DIAGNOSIS — N390 Urinary tract infection, site not specified: Secondary | ICD-10-CM | POA: Diagnosis not present

## 2021-08-14 DIAGNOSIS — E1159 Type 2 diabetes mellitus with other circulatory complications: Secondary | ICD-10-CM | POA: Diagnosis not present

## 2021-08-14 DIAGNOSIS — H8190 Unspecified disorder of vestibular function, unspecified ear: Secondary | ICD-10-CM | POA: Diagnosis not present

## 2021-08-14 DIAGNOSIS — S22080D Wedge compression fracture of T11-T12 vertebra, subsequent encounter for fracture with routine healing: Secondary | ICD-10-CM | POA: Diagnosis not present

## 2021-08-16 DIAGNOSIS — E1151 Type 2 diabetes mellitus with diabetic peripheral angiopathy without gangrene: Secondary | ICD-10-CM | POA: Diagnosis not present

## 2021-08-21 DIAGNOSIS — I872 Venous insufficiency (chronic) (peripheral): Secondary | ICD-10-CM | POA: Diagnosis not present

## 2021-08-21 DIAGNOSIS — E871 Hypo-osmolality and hyponatremia: Secondary | ICD-10-CM | POA: Diagnosis not present

## 2021-08-21 DIAGNOSIS — E1151 Type 2 diabetes mellitus with diabetic peripheral angiopathy without gangrene: Secondary | ICD-10-CM | POA: Diagnosis not present

## 2021-08-21 DIAGNOSIS — B9620 Unspecified Escherichia coli [E. coli] as the cause of diseases classified elsewhere: Secondary | ICD-10-CM | POA: Diagnosis not present

## 2021-08-21 DIAGNOSIS — S22080D Wedge compression fracture of T11-T12 vertebra, subsequent encounter for fracture with routine healing: Secondary | ICD-10-CM | POA: Diagnosis not present

## 2021-08-21 DIAGNOSIS — N390 Urinary tract infection, site not specified: Secondary | ICD-10-CM | POA: Diagnosis not present

## 2021-08-21 DIAGNOSIS — H8190 Unspecified disorder of vestibular function, unspecified ear: Secondary | ICD-10-CM | POA: Diagnosis not present

## 2021-08-21 DIAGNOSIS — I152 Hypertension secondary to endocrine disorders: Secondary | ICD-10-CM | POA: Diagnosis not present

## 2021-08-21 DIAGNOSIS — E1159 Type 2 diabetes mellitus with other circulatory complications: Secondary | ICD-10-CM | POA: Diagnosis not present

## 2021-08-25 DIAGNOSIS — J449 Chronic obstructive pulmonary disease, unspecified: Secondary | ICD-10-CM | POA: Diagnosis not present

## 2021-08-25 DIAGNOSIS — F329 Major depressive disorder, single episode, unspecified: Secondary | ICD-10-CM | POA: Diagnosis not present

## 2021-08-25 DIAGNOSIS — I10 Essential (primary) hypertension: Secondary | ICD-10-CM | POA: Diagnosis not present

## 2021-08-25 DIAGNOSIS — F419 Anxiety disorder, unspecified: Secondary | ICD-10-CM | POA: Diagnosis not present

## 2021-08-25 DIAGNOSIS — E119 Type 2 diabetes mellitus without complications: Secondary | ICD-10-CM | POA: Diagnosis not present

## 2021-08-26 DIAGNOSIS — I4821 Permanent atrial fibrillation: Secondary | ICD-10-CM | POA: Diagnosis not present

## 2021-08-26 DIAGNOSIS — E1159 Type 2 diabetes mellitus with other circulatory complications: Secondary | ICD-10-CM | POA: Diagnosis not present

## 2021-08-26 DIAGNOSIS — E871 Hypo-osmolality and hyponatremia: Secondary | ICD-10-CM | POA: Diagnosis not present

## 2021-08-26 DIAGNOSIS — N390 Urinary tract infection, site not specified: Secondary | ICD-10-CM | POA: Diagnosis not present

## 2021-08-26 DIAGNOSIS — B9620 Unspecified Escherichia coli [E. coli] as the cause of diseases classified elsewhere: Secondary | ICD-10-CM | POA: Diagnosis not present

## 2021-08-26 DIAGNOSIS — S22080D Wedge compression fracture of T11-T12 vertebra, subsequent encounter for fracture with routine healing: Secondary | ICD-10-CM | POA: Diagnosis not present

## 2021-08-26 DIAGNOSIS — I872 Venous insufficiency (chronic) (peripheral): Secondary | ICD-10-CM | POA: Diagnosis not present

## 2021-08-26 DIAGNOSIS — Z7901 Long term (current) use of anticoagulants: Secondary | ICD-10-CM | POA: Diagnosis not present

## 2021-08-26 DIAGNOSIS — I152 Hypertension secondary to endocrine disorders: Secondary | ICD-10-CM | POA: Diagnosis not present

## 2021-08-26 DIAGNOSIS — R600 Localized edema: Secondary | ICD-10-CM | POA: Diagnosis not present

## 2021-08-26 DIAGNOSIS — E1151 Type 2 diabetes mellitus with diabetic peripheral angiopathy without gangrene: Secondary | ICD-10-CM | POA: Diagnosis not present

## 2021-08-26 DIAGNOSIS — H8190 Unspecified disorder of vestibular function, unspecified ear: Secondary | ICD-10-CM | POA: Diagnosis not present

## 2021-09-02 DIAGNOSIS — E871 Hypo-osmolality and hyponatremia: Secondary | ICD-10-CM | POA: Diagnosis not present

## 2021-09-02 DIAGNOSIS — B9620 Unspecified Escherichia coli [E. coli] as the cause of diseases classified elsewhere: Secondary | ICD-10-CM | POA: Diagnosis not present

## 2021-09-02 DIAGNOSIS — H8190 Unspecified disorder of vestibular function, unspecified ear: Secondary | ICD-10-CM | POA: Diagnosis not present

## 2021-09-02 DIAGNOSIS — I872 Venous insufficiency (chronic) (peripheral): Secondary | ICD-10-CM | POA: Diagnosis not present

## 2021-09-02 DIAGNOSIS — N390 Urinary tract infection, site not specified: Secondary | ICD-10-CM | POA: Diagnosis not present

## 2021-09-02 DIAGNOSIS — E1151 Type 2 diabetes mellitus with diabetic peripheral angiopathy without gangrene: Secondary | ICD-10-CM | POA: Diagnosis not present

## 2021-09-02 DIAGNOSIS — E1159 Type 2 diabetes mellitus with other circulatory complications: Secondary | ICD-10-CM | POA: Diagnosis not present

## 2021-09-02 DIAGNOSIS — S22080D Wedge compression fracture of T11-T12 vertebra, subsequent encounter for fracture with routine healing: Secondary | ICD-10-CM | POA: Diagnosis not present

## 2021-09-02 DIAGNOSIS — I152 Hypertension secondary to endocrine disorders: Secondary | ICD-10-CM | POA: Diagnosis not present

## 2021-09-03 DIAGNOSIS — I10 Essential (primary) hypertension: Secondary | ICD-10-CM | POA: Diagnosis not present

## 2021-09-03 DIAGNOSIS — E1151 Type 2 diabetes mellitus with diabetic peripheral angiopathy without gangrene: Secondary | ICD-10-CM | POA: Diagnosis not present

## 2021-09-03 DIAGNOSIS — J449 Chronic obstructive pulmonary disease, unspecified: Secondary | ICD-10-CM | POA: Diagnosis not present

## 2021-09-03 DIAGNOSIS — E785 Hyperlipidemia, unspecified: Secondary | ICD-10-CM | POA: Diagnosis not present

## 2021-09-08 DIAGNOSIS — E1159 Type 2 diabetes mellitus with other circulatory complications: Secondary | ICD-10-CM | POA: Diagnosis not present

## 2021-09-08 DIAGNOSIS — N390 Urinary tract infection, site not specified: Secondary | ICD-10-CM | POA: Diagnosis not present

## 2021-09-08 DIAGNOSIS — I872 Venous insufficiency (chronic) (peripheral): Secondary | ICD-10-CM | POA: Diagnosis not present

## 2021-09-08 DIAGNOSIS — E1151 Type 2 diabetes mellitus with diabetic peripheral angiopathy without gangrene: Secondary | ICD-10-CM | POA: Diagnosis not present

## 2021-09-08 DIAGNOSIS — I152 Hypertension secondary to endocrine disorders: Secondary | ICD-10-CM | POA: Diagnosis not present

## 2021-09-08 DIAGNOSIS — S22080D Wedge compression fracture of T11-T12 vertebra, subsequent encounter for fracture with routine healing: Secondary | ICD-10-CM | POA: Diagnosis not present

## 2021-09-08 DIAGNOSIS — B9620 Unspecified Escherichia coli [E. coli] as the cause of diseases classified elsewhere: Secondary | ICD-10-CM | POA: Diagnosis not present

## 2021-09-08 DIAGNOSIS — E871 Hypo-osmolality and hyponatremia: Secondary | ICD-10-CM | POA: Diagnosis not present

## 2021-09-08 DIAGNOSIS — H8190 Unspecified disorder of vestibular function, unspecified ear: Secondary | ICD-10-CM | POA: Diagnosis not present

## 2021-09-09 DIAGNOSIS — N390 Urinary tract infection, site not specified: Secondary | ICD-10-CM | POA: Diagnosis not present

## 2021-09-09 DIAGNOSIS — B9620 Unspecified Escherichia coli [E. coli] as the cause of diseases classified elsewhere: Secondary | ICD-10-CM | POA: Diagnosis not present

## 2021-09-09 DIAGNOSIS — E1159 Type 2 diabetes mellitus with other circulatory complications: Secondary | ICD-10-CM | POA: Diagnosis not present

## 2021-09-09 DIAGNOSIS — F329 Major depressive disorder, single episode, unspecified: Secondary | ICD-10-CM | POA: Diagnosis not present

## 2021-09-09 DIAGNOSIS — S22080D Wedge compression fracture of T11-T12 vertebra, subsequent encounter for fracture with routine healing: Secondary | ICD-10-CM | POA: Diagnosis not present

## 2021-09-09 DIAGNOSIS — I10 Essential (primary) hypertension: Secondary | ICD-10-CM | POA: Diagnosis not present

## 2021-09-09 DIAGNOSIS — E1151 Type 2 diabetes mellitus with diabetic peripheral angiopathy without gangrene: Secondary | ICD-10-CM | POA: Diagnosis not present

## 2021-09-09 DIAGNOSIS — E871 Hypo-osmolality and hyponatremia: Secondary | ICD-10-CM | POA: Diagnosis not present

## 2021-09-09 DIAGNOSIS — I152 Hypertension secondary to endocrine disorders: Secondary | ICD-10-CM | POA: Diagnosis not present

## 2021-09-09 DIAGNOSIS — F419 Anxiety disorder, unspecified: Secondary | ICD-10-CM | POA: Diagnosis not present

## 2021-09-09 DIAGNOSIS — I872 Venous insufficiency (chronic) (peripheral): Secondary | ICD-10-CM | POA: Diagnosis not present

## 2021-09-09 DIAGNOSIS — H8190 Unspecified disorder of vestibular function, unspecified ear: Secondary | ICD-10-CM | POA: Diagnosis not present

## 2021-09-09 DIAGNOSIS — J449 Chronic obstructive pulmonary disease, unspecified: Secondary | ICD-10-CM | POA: Diagnosis not present

## 2021-09-16 DIAGNOSIS — E1159 Type 2 diabetes mellitus with other circulatory complications: Secondary | ICD-10-CM | POA: Diagnosis not present

## 2021-09-16 DIAGNOSIS — B9620 Unspecified Escherichia coli [E. coli] as the cause of diseases classified elsewhere: Secondary | ICD-10-CM | POA: Diagnosis not present

## 2021-09-16 DIAGNOSIS — N390 Urinary tract infection, site not specified: Secondary | ICD-10-CM | POA: Diagnosis not present

## 2021-09-16 DIAGNOSIS — H8190 Unspecified disorder of vestibular function, unspecified ear: Secondary | ICD-10-CM | POA: Diagnosis not present

## 2021-09-16 DIAGNOSIS — E1151 Type 2 diabetes mellitus with diabetic peripheral angiopathy without gangrene: Secondary | ICD-10-CM | POA: Diagnosis not present

## 2021-09-16 DIAGNOSIS — E871 Hypo-osmolality and hyponatremia: Secondary | ICD-10-CM | POA: Diagnosis not present

## 2021-09-16 DIAGNOSIS — I872 Venous insufficiency (chronic) (peripheral): Secondary | ICD-10-CM | POA: Diagnosis not present

## 2021-09-16 DIAGNOSIS — S22080D Wedge compression fracture of T11-T12 vertebra, subsequent encounter for fracture with routine healing: Secondary | ICD-10-CM | POA: Diagnosis not present

## 2021-09-16 DIAGNOSIS — I152 Hypertension secondary to endocrine disorders: Secondary | ICD-10-CM | POA: Diagnosis not present

## 2021-09-23 DIAGNOSIS — S22080D Wedge compression fracture of T11-T12 vertebra, subsequent encounter for fracture with routine healing: Secondary | ICD-10-CM | POA: Diagnosis not present

## 2021-09-23 DIAGNOSIS — E871 Hypo-osmolality and hyponatremia: Secondary | ICD-10-CM | POA: Diagnosis not present

## 2021-09-23 DIAGNOSIS — H8190 Unspecified disorder of vestibular function, unspecified ear: Secondary | ICD-10-CM | POA: Diagnosis not present

## 2021-09-23 DIAGNOSIS — E1159 Type 2 diabetes mellitus with other circulatory complications: Secondary | ICD-10-CM | POA: Diagnosis not present

## 2021-09-23 DIAGNOSIS — I152 Hypertension secondary to endocrine disorders: Secondary | ICD-10-CM | POA: Diagnosis not present

## 2021-09-23 DIAGNOSIS — B9620 Unspecified Escherichia coli [E. coli] as the cause of diseases classified elsewhere: Secondary | ICD-10-CM | POA: Diagnosis not present

## 2021-09-23 DIAGNOSIS — I872 Venous insufficiency (chronic) (peripheral): Secondary | ICD-10-CM | POA: Diagnosis not present

## 2021-09-23 DIAGNOSIS — N390 Urinary tract infection, site not specified: Secondary | ICD-10-CM | POA: Diagnosis not present

## 2021-09-23 DIAGNOSIS — E1151 Type 2 diabetes mellitus with diabetic peripheral angiopathy without gangrene: Secondary | ICD-10-CM | POA: Diagnosis not present

## 2021-10-03 DIAGNOSIS — E871 Hypo-osmolality and hyponatremia: Secondary | ICD-10-CM | POA: Diagnosis not present

## 2021-10-03 DIAGNOSIS — I152 Hypertension secondary to endocrine disorders: Secondary | ICD-10-CM | POA: Diagnosis not present

## 2021-10-03 DIAGNOSIS — I872 Venous insufficiency (chronic) (peripheral): Secondary | ICD-10-CM | POA: Diagnosis not present

## 2021-10-03 DIAGNOSIS — B9620 Unspecified Escherichia coli [E. coli] as the cause of diseases classified elsewhere: Secondary | ICD-10-CM | POA: Diagnosis not present

## 2021-10-03 DIAGNOSIS — H8190 Unspecified disorder of vestibular function, unspecified ear: Secondary | ICD-10-CM | POA: Diagnosis not present

## 2021-10-03 DIAGNOSIS — N390 Urinary tract infection, site not specified: Secondary | ICD-10-CM | POA: Diagnosis not present

## 2021-10-03 DIAGNOSIS — E1159 Type 2 diabetes mellitus with other circulatory complications: Secondary | ICD-10-CM | POA: Diagnosis not present

## 2021-10-03 DIAGNOSIS — S22080D Wedge compression fracture of T11-T12 vertebra, subsequent encounter for fracture with routine healing: Secondary | ICD-10-CM | POA: Diagnosis not present

## 2021-10-03 DIAGNOSIS — E1151 Type 2 diabetes mellitus with diabetic peripheral angiopathy without gangrene: Secondary | ICD-10-CM | POA: Diagnosis not present

## 2021-10-14 ENCOUNTER — Ambulatory Visit (INDEPENDENT_AMBULATORY_CARE_PROVIDER_SITE_OTHER): Payer: Medicare HMO | Admitting: Podiatry

## 2021-10-14 ENCOUNTER — Encounter: Payer: Self-pay | Admitting: Podiatry

## 2021-10-14 ENCOUNTER — Other Ambulatory Visit: Payer: Self-pay

## 2021-10-14 DIAGNOSIS — M79675 Pain in left toe(s): Secondary | ICD-10-CM | POA: Diagnosis not present

## 2021-10-14 DIAGNOSIS — B351 Tinea unguium: Secondary | ICD-10-CM

## 2021-10-14 DIAGNOSIS — M79674 Pain in right toe(s): Secondary | ICD-10-CM | POA: Diagnosis not present

## 2021-10-14 DIAGNOSIS — G629 Polyneuropathy, unspecified: Secondary | ICD-10-CM

## 2021-10-14 DIAGNOSIS — E1140 Type 2 diabetes mellitus with diabetic neuropathy, unspecified: Secondary | ICD-10-CM

## 2021-10-14 NOTE — Progress Notes (Signed)
This patient returns to my office for at risk foot care.  This patient requires this care by a professional since this patient will be at risk due to having diabetes with neuropathy and coagulation defect.  She presents to the office in a wheelchair due to back stress fracture.  She is accompanied by her husband.This patient is unable to cut nails herself since the patient cannot reach her nails.These nails are painful walking and wearing shoes.  This patient presents for at risk foot care today.  General Appearance  Alert, conversant and in no acute stress.  Vascular  Dorsalis pedis and posterior tibial  pulses are weakly  palpable  bilaterally.  Capillary return is within normal limits  bilaterally. Cold feet bilaterally. Absent digital hair  B/L.  Neurologic  Senn-Weinstein monofilament wire test diminished  limits  bilaterally. Muscle power within normal limits bilaterally.  Nails Thick disfigured discolored nails with subungual debris  from hallux to fifth toes bilaterally. No evidence of bacterial infection or drainage bilaterally.  Orthopedic  No limitations of motion  feet .  No crepitus or effusions noted.  No bony pathology or digital deformities noted.  Skin  normotropic skin with no porokeratosis noted bilaterally.  No signs of infections or ulcers noted.     Onychomycosis  Pain in right toes  Pain in left toes  Consent was obtained for treatment procedures.   Mechanical debridement of nails 1-5  bilaterally performed with a nail nipper.  Filed with dremel without incident.    Return office visit  4 months.                    Told patient to return for periodic foot care and evaluation due to potential at risk complications.   Gardiner Barefoot DPM

## 2021-10-27 DIAGNOSIS — J449 Chronic obstructive pulmonary disease, unspecified: Secondary | ICD-10-CM | POA: Diagnosis not present

## 2021-10-27 DIAGNOSIS — M1990 Unspecified osteoarthritis, unspecified site: Secondary | ICD-10-CM | POA: Diagnosis not present

## 2021-10-27 DIAGNOSIS — I4891 Unspecified atrial fibrillation: Secondary | ICD-10-CM | POA: Diagnosis not present

## 2021-10-27 DIAGNOSIS — R159 Full incontinence of feces: Secondary | ICD-10-CM | POA: Diagnosis not present

## 2021-10-27 DIAGNOSIS — H3530 Unspecified macular degeneration: Secondary | ICD-10-CM | POA: Diagnosis not present

## 2021-10-27 DIAGNOSIS — M810 Age-related osteoporosis without current pathological fracture: Secondary | ICD-10-CM | POA: Diagnosis not present

## 2021-10-27 DIAGNOSIS — G4700 Insomnia, unspecified: Secondary | ICD-10-CM | POA: Diagnosis not present

## 2021-10-27 DIAGNOSIS — E119 Type 2 diabetes mellitus without complications: Secondary | ICD-10-CM | POA: Diagnosis not present

## 2021-10-27 DIAGNOSIS — R2681 Unsteadiness on feet: Secondary | ICD-10-CM | POA: Diagnosis not present

## 2021-10-28 DIAGNOSIS — Z7901 Long term (current) use of anticoagulants: Secondary | ICD-10-CM | POA: Diagnosis not present

## 2021-10-28 DIAGNOSIS — Z23 Encounter for immunization: Secondary | ICD-10-CM | POA: Diagnosis not present

## 2021-10-28 DIAGNOSIS — I4821 Permanent atrial fibrillation: Secondary | ICD-10-CM | POA: Diagnosis not present

## 2021-10-28 DIAGNOSIS — E1151 Type 2 diabetes mellitus with diabetic peripheral angiopathy without gangrene: Secondary | ICD-10-CM | POA: Diagnosis not present

## 2021-10-28 DIAGNOSIS — Z794 Long term (current) use of insulin: Secondary | ICD-10-CM | POA: Diagnosis not present

## 2021-11-02 DIAGNOSIS — E785 Hyperlipidemia, unspecified: Secondary | ICD-10-CM | POA: Diagnosis not present

## 2021-11-02 DIAGNOSIS — J449 Chronic obstructive pulmonary disease, unspecified: Secondary | ICD-10-CM | POA: Diagnosis not present

## 2021-11-02 DIAGNOSIS — E1151 Type 2 diabetes mellitus with diabetic peripheral angiopathy without gangrene: Secondary | ICD-10-CM | POA: Diagnosis not present

## 2021-11-02 DIAGNOSIS — I10 Essential (primary) hypertension: Secondary | ICD-10-CM | POA: Diagnosis not present

## 2021-11-04 DIAGNOSIS — F419 Anxiety disorder, unspecified: Secondary | ICD-10-CM | POA: Diagnosis not present

## 2021-11-04 DIAGNOSIS — E119 Type 2 diabetes mellitus without complications: Secondary | ICD-10-CM | POA: Diagnosis not present

## 2021-11-04 DIAGNOSIS — J449 Chronic obstructive pulmonary disease, unspecified: Secondary | ICD-10-CM | POA: Diagnosis not present

## 2021-11-04 DIAGNOSIS — I10 Essential (primary) hypertension: Secondary | ICD-10-CM | POA: Diagnosis not present

## 2021-11-04 DIAGNOSIS — F329 Major depressive disorder, single episode, unspecified: Secondary | ICD-10-CM | POA: Diagnosis not present

## 2021-11-15 DIAGNOSIS — E1151 Type 2 diabetes mellitus with diabetic peripheral angiopathy without gangrene: Secondary | ICD-10-CM | POA: Diagnosis not present

## 2021-12-01 DIAGNOSIS — I4821 Permanent atrial fibrillation: Secondary | ICD-10-CM | POA: Diagnosis not present

## 2021-12-01 DIAGNOSIS — Z7901 Long term (current) use of anticoagulants: Secondary | ICD-10-CM | POA: Diagnosis not present

## 2021-12-02 DIAGNOSIS — F419 Anxiety disorder, unspecified: Secondary | ICD-10-CM | POA: Diagnosis not present

## 2021-12-02 DIAGNOSIS — I10 Essential (primary) hypertension: Secondary | ICD-10-CM | POA: Diagnosis not present

## 2021-12-02 DIAGNOSIS — F329 Major depressive disorder, single episode, unspecified: Secondary | ICD-10-CM | POA: Diagnosis not present

## 2021-12-02 DIAGNOSIS — J449 Chronic obstructive pulmonary disease, unspecified: Secondary | ICD-10-CM | POA: Diagnosis not present

## 2021-12-02 DIAGNOSIS — E119 Type 2 diabetes mellitus without complications: Secondary | ICD-10-CM | POA: Diagnosis not present

## 2021-12-04 DIAGNOSIS — L304 Erythema intertrigo: Secondary | ICD-10-CM | POA: Diagnosis not present

## 2021-12-04 DIAGNOSIS — L821 Other seborrheic keratosis: Secondary | ICD-10-CM | POA: Diagnosis not present

## 2021-12-04 DIAGNOSIS — L03011 Cellulitis of right finger: Secondary | ICD-10-CM | POA: Diagnosis not present

## 2021-12-04 DIAGNOSIS — L03012 Cellulitis of left finger: Secondary | ICD-10-CM | POA: Diagnosis not present

## 2021-12-08 ENCOUNTER — Emergency Department (HOSPITAL_COMMUNITY): Payer: Medicare HMO

## 2021-12-08 ENCOUNTER — Inpatient Hospital Stay (HOSPITAL_COMMUNITY)
Admission: EM | Admit: 2021-12-08 | Discharge: 2022-01-03 | DRG: 871 | Disposition: E | Payer: Medicare HMO | Attending: Internal Medicine | Admitting: Internal Medicine

## 2021-12-08 ENCOUNTER — Encounter (HOSPITAL_COMMUNITY): Payer: Self-pay | Admitting: Emergency Medicine

## 2021-12-08 DIAGNOSIS — R7989 Other specified abnormal findings of blood chemistry: Secondary | ICD-10-CM | POA: Diagnosis present

## 2021-12-08 DIAGNOSIS — E1165 Type 2 diabetes mellitus with hyperglycemia: Secondary | ICD-10-CM | POA: Diagnosis present

## 2021-12-08 DIAGNOSIS — R220 Localized swelling, mass and lump, head: Secondary | ICD-10-CM | POA: Diagnosis not present

## 2021-12-08 DIAGNOSIS — R404 Transient alteration of awareness: Secondary | ICD-10-CM | POA: Diagnosis not present

## 2021-12-08 DIAGNOSIS — J810 Acute pulmonary edema: Secondary | ICD-10-CM | POA: Diagnosis not present

## 2021-12-08 DIAGNOSIS — J9621 Acute and chronic respiratory failure with hypoxia: Secondary | ICD-10-CM | POA: Diagnosis present

## 2021-12-08 DIAGNOSIS — R4182 Altered mental status, unspecified: Secondary | ICD-10-CM | POA: Diagnosis not present

## 2021-12-08 DIAGNOSIS — J189 Pneumonia, unspecified organism: Secondary | ICD-10-CM | POA: Diagnosis not present

## 2021-12-08 DIAGNOSIS — Z7189 Other specified counseling: Secondary | ICD-10-CM | POA: Diagnosis not present

## 2021-12-08 DIAGNOSIS — N390 Urinary tract infection, site not specified: Secondary | ICD-10-CM | POA: Diagnosis not present

## 2021-12-08 DIAGNOSIS — I110 Hypertensive heart disease with heart failure: Secondary | ICD-10-CM | POA: Diagnosis present

## 2021-12-08 DIAGNOSIS — Z20822 Contact with and (suspected) exposure to covid-19: Secondary | ICD-10-CM | POA: Diagnosis not present

## 2021-12-08 DIAGNOSIS — G9341 Metabolic encephalopathy: Secondary | ICD-10-CM | POA: Diagnosis present

## 2021-12-08 DIAGNOSIS — R6510 Systemic inflammatory response syndrome (SIRS) of non-infectious origin without acute organ dysfunction: Secondary | ICD-10-CM | POA: Diagnosis not present

## 2021-12-08 DIAGNOSIS — W19XXXA Unspecified fall, initial encounter: Secondary | ICD-10-CM | POA: Diagnosis not present

## 2021-12-08 DIAGNOSIS — I10 Essential (primary) hypertension: Secondary | ICD-10-CM | POA: Diagnosis not present

## 2021-12-08 DIAGNOSIS — R410 Disorientation, unspecified: Secondary | ICD-10-CM | POA: Diagnosis not present

## 2021-12-08 DIAGNOSIS — E11649 Type 2 diabetes mellitus with hypoglycemia without coma: Secondary | ICD-10-CM | POA: Diagnosis present

## 2021-12-08 DIAGNOSIS — E1151 Type 2 diabetes mellitus with diabetic peripheral angiopathy without gangrene: Secondary | ICD-10-CM | POA: Diagnosis present

## 2021-12-08 DIAGNOSIS — F32A Depression, unspecified: Secondary | ICD-10-CM | POA: Diagnosis present

## 2021-12-08 DIAGNOSIS — E871 Hypo-osmolality and hyponatremia: Secondary | ICD-10-CM | POA: Diagnosis present

## 2021-12-08 DIAGNOSIS — Z7901 Long term (current) use of anticoagulants: Secondary | ICD-10-CM

## 2021-12-08 DIAGNOSIS — I4891 Unspecified atrial fibrillation: Secondary | ICD-10-CM | POA: Diagnosis present

## 2021-12-08 DIAGNOSIS — R0902 Hypoxemia: Secondary | ICD-10-CM | POA: Diagnosis not present

## 2021-12-08 DIAGNOSIS — I4510 Unspecified right bundle-branch block: Secondary | ICD-10-CM | POA: Diagnosis present

## 2021-12-08 DIAGNOSIS — Z8249 Family history of ischemic heart disease and other diseases of the circulatory system: Secondary | ICD-10-CM

## 2021-12-08 DIAGNOSIS — I517 Cardiomegaly: Secondary | ICD-10-CM | POA: Diagnosis not present

## 2021-12-08 DIAGNOSIS — Z6826 Body mass index (BMI) 26.0-26.9, adult: Secondary | ICD-10-CM

## 2021-12-08 DIAGNOSIS — I2510 Atherosclerotic heart disease of native coronary artery without angina pectoris: Secondary | ICD-10-CM | POA: Diagnosis present

## 2021-12-08 DIAGNOSIS — Z7951 Long term (current) use of inhaled steroids: Secondary | ICD-10-CM

## 2021-12-08 DIAGNOSIS — J129 Viral pneumonia, unspecified: Secondary | ICD-10-CM | POA: Diagnosis not present

## 2021-12-08 DIAGNOSIS — J811 Chronic pulmonary edema: Secondary | ICD-10-CM | POA: Diagnosis not present

## 2021-12-08 DIAGNOSIS — N3000 Acute cystitis without hematuria: Secondary | ICD-10-CM

## 2021-12-08 DIAGNOSIS — I083 Combined rheumatic disorders of mitral, aortic and tricuspid valves: Secondary | ICD-10-CM | POA: Diagnosis present

## 2021-12-08 DIAGNOSIS — I5033 Acute on chronic diastolic (congestive) heart failure: Secondary | ICD-10-CM | POA: Diagnosis present

## 2021-12-08 DIAGNOSIS — R609 Edema, unspecified: Secondary | ICD-10-CM | POA: Diagnosis not present

## 2021-12-08 DIAGNOSIS — H409 Unspecified glaucoma: Secondary | ICD-10-CM | POA: Diagnosis present

## 2021-12-08 DIAGNOSIS — R6520 Severe sepsis without septic shock: Secondary | ICD-10-CM | POA: Diagnosis not present

## 2021-12-08 DIAGNOSIS — Z4682 Encounter for fitting and adjustment of non-vascular catheter: Secondary | ICD-10-CM | POA: Diagnosis not present

## 2021-12-08 DIAGNOSIS — M6281 Muscle weakness (generalized): Secondary | ICD-10-CM | POA: Diagnosis not present

## 2021-12-08 DIAGNOSIS — J9611 Chronic respiratory failure with hypoxia: Secondary | ICD-10-CM | POA: Diagnosis present

## 2021-12-08 DIAGNOSIS — F0390 Unspecified dementia without behavioral disturbance: Secondary | ICD-10-CM

## 2021-12-08 DIAGNOSIS — Z8616 Personal history of COVID-19: Secondary | ICD-10-CM

## 2021-12-08 DIAGNOSIS — J159 Unspecified bacterial pneumonia: Secondary | ICD-10-CM | POA: Diagnosis not present

## 2021-12-08 DIAGNOSIS — I482 Chronic atrial fibrillation, unspecified: Secondary | ICD-10-CM

## 2021-12-08 DIAGNOSIS — R0609 Other forms of dyspnea: Secondary | ICD-10-CM | POA: Diagnosis not present

## 2021-12-08 DIAGNOSIS — J9601 Acute respiratory failure with hypoxia: Secondary | ICD-10-CM | POA: Diagnosis not present

## 2021-12-08 DIAGNOSIS — I6529 Occlusion and stenosis of unspecified carotid artery: Secondary | ICD-10-CM | POA: Diagnosis present

## 2021-12-08 DIAGNOSIS — A4151 Sepsis due to Escherichia coli [E. coli]: Secondary | ICD-10-CM | POA: Diagnosis not present

## 2021-12-08 DIAGNOSIS — G9340 Encephalopathy, unspecified: Secondary | ICD-10-CM | POA: Diagnosis present

## 2021-12-08 DIAGNOSIS — I4821 Permanent atrial fibrillation: Secondary | ICD-10-CM | POA: Diagnosis present

## 2021-12-08 DIAGNOSIS — A419 Sepsis, unspecified organism: Secondary | ICD-10-CM | POA: Diagnosis present

## 2021-12-08 DIAGNOSIS — R000 Tachycardia, unspecified: Secondary | ICD-10-CM | POA: Diagnosis not present

## 2021-12-08 DIAGNOSIS — Z96642 Presence of left artificial hip joint: Secondary | ICD-10-CM | POA: Diagnosis present

## 2021-12-08 DIAGNOSIS — R0602 Shortness of breath: Secondary | ICD-10-CM | POA: Diagnosis not present

## 2021-12-08 DIAGNOSIS — D649 Anemia, unspecified: Secondary | ICD-10-CM | POA: Diagnosis present

## 2021-12-08 DIAGNOSIS — Z515 Encounter for palliative care: Secondary | ICD-10-CM | POA: Diagnosis not present

## 2021-12-08 DIAGNOSIS — Z7984 Long term (current) use of oral hypoglycemic drugs: Secondary | ICD-10-CM

## 2021-12-08 DIAGNOSIS — J440 Chronic obstructive pulmonary disease with acute lower respiratory infection: Secondary | ICD-10-CM | POA: Diagnosis present

## 2021-12-08 DIAGNOSIS — Z4659 Encounter for fitting and adjustment of other gastrointestinal appliance and device: Secondary | ICD-10-CM

## 2021-12-08 DIAGNOSIS — E162 Hypoglycemia, unspecified: Secondary | ICD-10-CM | POA: Diagnosis not present

## 2021-12-08 DIAGNOSIS — R1310 Dysphagia, unspecified: Secondary | ICD-10-CM | POA: Diagnosis not present

## 2021-12-08 DIAGNOSIS — M1611 Unilateral primary osteoarthritis, right hip: Secondary | ICD-10-CM | POA: Diagnosis not present

## 2021-12-08 DIAGNOSIS — I248 Other forms of acute ischemic heart disease: Secondary | ICD-10-CM | POA: Diagnosis present

## 2021-12-08 DIAGNOSIS — Z95828 Presence of other vascular implants and grafts: Secondary | ICD-10-CM

## 2021-12-08 DIAGNOSIS — Z79899 Other long term (current) drug therapy: Secondary | ICD-10-CM

## 2021-12-08 DIAGNOSIS — Z794 Long term (current) use of insulin: Secondary | ICD-10-CM

## 2021-12-08 DIAGNOSIS — E7800 Pure hypercholesterolemia, unspecified: Secondary | ICD-10-CM | POA: Diagnosis present

## 2021-12-08 DIAGNOSIS — E1140 Type 2 diabetes mellitus with diabetic neuropathy, unspecified: Secondary | ICD-10-CM

## 2021-12-08 DIAGNOSIS — R4781 Slurred speech: Secondary | ICD-10-CM | POA: Diagnosis not present

## 2021-12-08 DIAGNOSIS — R531 Weakness: Secondary | ICD-10-CM | POA: Diagnosis not present

## 2021-12-08 DIAGNOSIS — Z880 Allergy status to penicillin: Secondary | ICD-10-CM

## 2021-12-08 DIAGNOSIS — Z66 Do not resuscitate: Secondary | ICD-10-CM | POA: Diagnosis present

## 2021-12-08 DIAGNOSIS — R0600 Dyspnea, unspecified: Secondary | ICD-10-CM

## 2021-12-08 DIAGNOSIS — J449 Chronic obstructive pulmonary disease, unspecified: Secondary | ICD-10-CM

## 2021-12-08 DIAGNOSIS — E876 Hypokalemia: Secondary | ICD-10-CM | POA: Diagnosis not present

## 2021-12-08 DIAGNOSIS — R778 Other specified abnormalities of plasma proteins: Secondary | ICD-10-CM | POA: Diagnosis present

## 2021-12-08 DIAGNOSIS — E440 Moderate protein-calorie malnutrition: Secondary | ICD-10-CM | POA: Insufficient documentation

## 2021-12-08 DIAGNOSIS — Z833 Family history of diabetes mellitus: Secondary | ICD-10-CM

## 2021-12-08 DIAGNOSIS — K219 Gastro-esophageal reflux disease without esophagitis: Secondary | ICD-10-CM | POA: Diagnosis present

## 2021-12-08 DIAGNOSIS — Z881 Allergy status to other antibiotic agents status: Secondary | ICD-10-CM

## 2021-12-08 DIAGNOSIS — E119 Type 2 diabetes mellitus without complications: Secondary | ICD-10-CM

## 2021-12-08 DIAGNOSIS — E860 Dehydration: Secondary | ICD-10-CM | POA: Diagnosis present

## 2021-12-08 DIAGNOSIS — E739 Lactose intolerance, unspecified: Secondary | ICD-10-CM | POA: Diagnosis present

## 2021-12-08 LAB — URINALYSIS, ROUTINE W REFLEX MICROSCOPIC
Bilirubin Urine: NEGATIVE
Glucose, UA: NEGATIVE mg/dL
Ketones, ur: 5 mg/dL — AB
Nitrite: POSITIVE — AB
Protein, ur: 100 mg/dL — AB
Specific Gravity, Urine: 1.01 (ref 1.005–1.030)
WBC, UA: >50 WBC/hpf — ABNORMAL HIGH (ref 0–5)
pH: 6 (ref 5.0–8.0)

## 2021-12-08 LAB — CBC WITH DIFFERENTIAL/PLATELET
Abs Immature Granulocytes: 0.14 10*3/uL — ABNORMAL HIGH (ref 0.00–0.07)
Basophils Absolute: 0 10*3/uL (ref 0.0–0.1)
Basophils Relative: 0 %
Eosinophils Absolute: 0 10*3/uL (ref 0.0–0.5)
Eosinophils Relative: 0 %
HCT: 38 % (ref 36.0–46.0)
Hemoglobin: 11.6 g/dL — ABNORMAL LOW (ref 12.0–15.0)
Immature Granulocytes: 1 %
Lymphocytes Relative: 8 %
Lymphs Abs: 1.1 10*3/uL (ref 0.7–4.0)
MCH: 26.6 pg (ref 26.0–34.0)
MCHC: 30.5 g/dL (ref 30.0–36.0)
MCV: 87.2 fL (ref 80.0–100.0)
Monocytes Absolute: 0.7 10*3/uL (ref 0.1–1.0)
Monocytes Relative: 5 %
Neutro Abs: 11.1 10*3/uL — ABNORMAL HIGH (ref 1.7–7.7)
Neutrophils Relative %: 86 %
Platelets: ADEQUATE 10*3/uL (ref 150–400)
RBC: 4.36 MIL/uL (ref 3.87–5.11)
RDW: 15.5 % (ref 11.5–15.5)
WBC: 13.1 10*3/uL — ABNORMAL HIGH (ref 4.0–10.5)
nRBC: 0 % (ref 0.0–0.2)

## 2021-12-08 LAB — I-STAT VENOUS BLOOD GAS, ED
Acid-Base Excess: 3 mmol/L — ABNORMAL HIGH (ref 0.0–2.0)
Bicarbonate: 26.4 mmol/L (ref 20.0–28.0)
Calcium, Ion: 0.93 mmol/L — ABNORMAL LOW (ref 1.15–1.40)
HCT: 35 % — ABNORMAL LOW (ref 36.0–46.0)
Hemoglobin: 11.9 g/dL — ABNORMAL LOW (ref 12.0–15.0)
O2 Saturation: 92 %
Potassium: 3.9 mmol/L (ref 3.5–5.1)
Sodium: 128 mmol/L — ABNORMAL LOW (ref 135–145)
TCO2: 28 mmol/L (ref 22–32)
pCO2, Ven: 37.3 mmHg — ABNORMAL LOW (ref 44–60)
pH, Ven: 7.458 — ABNORMAL HIGH (ref 7.25–7.43)
pO2, Ven: 60 mmHg — ABNORMAL HIGH (ref 32–45)

## 2021-12-08 LAB — RAPID URINE DRUG SCREEN, HOSP PERFORMED
Amphetamines: NOT DETECTED
Barbiturates: NOT DETECTED
Benzodiazepines: NOT DETECTED
Cocaine: NOT DETECTED
Opiates: NOT DETECTED
Tetrahydrocannabinol: NOT DETECTED

## 2021-12-08 LAB — CBC
HCT: 28.7 % — ABNORMAL LOW (ref 36.0–46.0)
Hemoglobin: 9 g/dL — ABNORMAL LOW (ref 12.0–15.0)
MCH: 26.9 pg (ref 26.0–34.0)
MCHC: 31.4 g/dL (ref 30.0–36.0)
MCV: 85.7 fL (ref 80.0–100.0)
Platelets: 150 10*3/uL (ref 150–400)
RBC: 3.35 MIL/uL — ABNORMAL LOW (ref 3.87–5.11)
RDW: 15.7 % — ABNORMAL HIGH (ref 11.5–15.5)
WBC: 8.2 10*3/uL (ref 4.0–10.5)
nRBC: 0 % (ref 0.0–0.2)

## 2021-12-08 LAB — COMPREHENSIVE METABOLIC PANEL
ALT: 19 U/L (ref 0–44)
AST: 48 U/L — ABNORMAL HIGH (ref 15–41)
Albumin: 3.5 g/dL (ref 3.5–5.0)
Alkaline Phosphatase: 60 U/L (ref 38–126)
Anion gap: 13 (ref 5–15)
BUN: 12 mg/dL (ref 8–23)
CO2: 23 mmol/L (ref 22–32)
Calcium: 8.1 mg/dL — ABNORMAL LOW (ref 8.9–10.3)
Chloride: 94 mmol/L — ABNORMAL LOW (ref 98–111)
Creatinine, Ser: 0.82 mg/dL (ref 0.44–1.00)
GFR, Estimated: >60 mL/min (ref >60)
Glucose, Bld: 236 mg/dL — ABNORMAL HIGH (ref 70–99)
Potassium: 3.8 mmol/L (ref 3.5–5.1)
Sodium: 130 mmol/L — ABNORMAL LOW (ref 135–145)
Total Bilirubin: 0.7 mg/dL (ref 0.3–1.2)
Total Protein: 6.9 g/dL (ref 6.5–8.1)

## 2021-12-08 LAB — DIFFERENTIAL
Abs Immature Granulocytes: 0.08 10*3/uL — ABNORMAL HIGH (ref 0.00–0.07)
Basophils Absolute: 0 10*3/uL (ref 0.0–0.1)
Basophils Relative: 0 %
Eosinophils Absolute: 0 10*3/uL (ref 0.0–0.5)
Eosinophils Relative: 0 %
Immature Granulocytes: 1 %
Lymphocytes Relative: 14 %
Lymphs Abs: 1.1 10*3/uL (ref 0.7–4.0)
Monocytes Absolute: 0.5 10*3/uL (ref 0.1–1.0)
Monocytes Relative: 6 %
Neutro Abs: 6.4 10*3/uL (ref 1.7–7.7)
Neutrophils Relative %: 79 %

## 2021-12-08 LAB — ETHANOL: Alcohol, Ethyl (B): <10 mg/dL (ref <10)

## 2021-12-08 LAB — LACTIC ACID, PLASMA: Lactic Acid, Venous: 2.5 mmol/L (ref 0.5–1.9)

## 2021-12-08 LAB — I-STAT ARTERIAL BLOOD GAS, ED
Acid-Base Excess: 3 mmol/L — ABNORMAL HIGH (ref 0.0–2.0)
Bicarbonate: 25.9 mmol/L (ref 20.0–28.0)
Calcium, Ion: 1.03 mmol/L — ABNORMAL LOW (ref 1.15–1.40)
HCT: 31 % — ABNORMAL LOW (ref 36.0–46.0)
Hemoglobin: 10.5 g/dL — ABNORMAL LOW (ref 12.0–15.0)
O2 Saturation: 97 %
Patient temperature: 98.8
Potassium: 2.6 mmol/L — CL (ref 3.5–5.1)
Sodium: 133 mmol/L — ABNORMAL LOW (ref 135–145)
TCO2: 27 mmol/L (ref 22–32)
pCO2 arterial: 31.8 mmHg — ABNORMAL LOW (ref 32–48)
pH, Arterial: 7.519 — ABNORMAL HIGH (ref 7.35–7.45)
pO2, Arterial: 82 mmHg — ABNORMAL LOW (ref 83–108)

## 2021-12-08 LAB — PROTIME-INR
INR: 1.4 — ABNORMAL HIGH (ref 0.8–1.2)
Prothrombin Time: 16.9 seconds — ABNORMAL HIGH (ref 11.4–15.2)

## 2021-12-08 LAB — RETICULOCYTES
Immature Retic Fract: 27.7 % — ABNORMAL HIGH (ref 2.3–15.9)
RBC.: 3.41 MIL/uL — ABNORMAL LOW (ref 3.87–5.11)
Retic Count, Absolute: 70.6 10*3/uL (ref 19.0–186.0)
Retic Ct Pct: 2.1 % (ref 0.4–3.1)

## 2021-12-08 LAB — AMMONIA: Ammonia: 38 umol/L — ABNORMAL HIGH (ref 9–35)

## 2021-12-08 LAB — BRAIN NATRIURETIC PEPTIDE: B Natriuretic Peptide: 412 pg/mL — ABNORMAL HIGH (ref 0.0–100.0)

## 2021-12-08 LAB — SODIUM, URINE, RANDOM: Sodium, Ur: 99 mmol/L

## 2021-12-08 LAB — CBG MONITORING, ED
Glucose-Capillary: 26 mg/dL — CL (ref 70–99)
Glucose-Capillary: 155 mg/dL — ABNORMAL HIGH (ref 70–99)
Glucose-Capillary: 54 mg/dL — ABNORMAL LOW (ref 70–99)
Glucose-Capillary: 108 mg/dL — ABNORMAL HIGH (ref 70–99)
Glucose-Capillary: 68 mg/dL — ABNORMAL LOW (ref 70–99)

## 2021-12-08 LAB — OSMOLALITY, URINE: Osmolality, Ur: 405 mOsm/kg (ref 300–900)

## 2021-12-08 LAB — APTT: aPTT: 22 seconds — ABNORMAL LOW (ref 24–36)

## 2021-12-08 LAB — RESP PANEL BY RT-PCR (FLU A&B, COVID) ARPGX2
Influenza A by PCR: NEGATIVE
Influenza B by PCR: NEGATIVE
SARS Coronavirus 2 by RT PCR: NEGATIVE

## 2021-12-08 LAB — CREATININE, URINE, RANDOM: Creatinine, Urine: 40.97 mg/dL

## 2021-12-08 MED ORDER — ALBUTEROL SULFATE (2.5 MG/3ML) 0.083% IN NEBU
2.5000 mg | INHALATION_SOLUTION | RESPIRATORY_TRACT | Status: DC | PRN
  Administered 2021-12-12: 2.5 mg via RESPIRATORY_TRACT
  Filled 2021-12-08 (×2): qty 3

## 2021-12-08 MED ORDER — DEXTROSE 50 % IV SOLN
INTRAVENOUS | Status: AC
Start: 2021-12-08 — End: 2021-12-08
  Filled 2021-12-08: qty 50

## 2021-12-08 MED ORDER — IPRATROPIUM-ALBUTEROL 0.5-2.5 (3) MG/3ML IN SOLN
3.0000 mL | Freq: Four times a day (QID) | RESPIRATORY_TRACT | Status: DC
  Administered 2021-12-08 – 2021-12-10 (×6): 3 mL via RESPIRATORY_TRACT
  Filled 2021-12-08 (×6): qty 3

## 2021-12-08 MED ORDER — ACETAMINOPHEN 650 MG RE SUPP
650.0000 mg | Freq: Four times a day (QID) | RECTAL | Status: DC | PRN
  Administered 2021-12-09: 650 mg via RECTAL
  Filled 2021-12-08: qty 1

## 2021-12-08 MED ORDER — DEXTROSE 50 % IV SOLN: 50.0000 mL | Freq: Once | INTRAVENOUS | Status: AC

## 2021-12-08 MED ORDER — DEXTROSE 50 % IV SOLN
12.5000 g | Freq: Once | INTRAVENOUS | Status: AC
  Administered 2021-12-08: 12.5 g via INTRAVENOUS
  Filled 2021-12-08: qty 50

## 2021-12-08 MED ORDER — DEXTROSE 50 % IV SOLN
1.0000 | Freq: Once | INTRAVENOUS | Status: AC
  Administered 2021-12-08: 50 mL via INTRAVENOUS
  Filled 2021-12-08: qty 50

## 2021-12-08 MED ORDER — WARFARIN - PHARMACIST DOSING INPATIENT: Freq: Every day | Status: DC

## 2021-12-08 MED ORDER — SODIUM CHLORIDE 0.9 % IV SOLN: 500.0000 mg | INTRAVENOUS | Status: DC

## 2021-12-08 MED ORDER — SODIUM CHLORIDE 0.9 % IV SOLN
500.0000 mg | Freq: Once | INTRAVENOUS | Status: AC
  Administered 2021-12-08: 500 mg via INTRAVENOUS
  Filled 2021-12-08: qty 5

## 2021-12-08 MED ORDER — ALBUTEROL SULFATE (2.5 MG/3ML) 0.083% IN NEBU
5.0000 mg | INHALATION_SOLUTION | Freq: Once | RESPIRATORY_TRACT | Status: AC
  Administered 2021-12-08: 5 mg via RESPIRATORY_TRACT
  Filled 2021-12-08: qty 6

## 2021-12-08 MED ORDER — SODIUM CHLORIDE 0.9 % IV SOLN: 2.0000 g | INTRAVENOUS | Status: DC

## 2021-12-08 MED ORDER — WARFARIN SODIUM 3 MG PO TABS
3.0000 mg | ORAL_TABLET | Freq: Once | ORAL | Status: AC
  Administered 2021-12-09: 3 mg via ORAL
  Filled 2021-12-08: qty 1

## 2021-12-08 MED ORDER — DEXTROSE 10 % IV SOLN: INTRAVENOUS | Status: AC

## 2021-12-08 MED ORDER — ACETAMINOPHEN 325 MG PO TABS: 650.0000 mg | ORAL_TABLET | Freq: Four times a day (QID) | ORAL | Status: DC | PRN

## 2021-12-08 MED ORDER — HYDROCODONE-ACETAMINOPHEN 5-325 MG PO TABS
1.0000 | ORAL_TABLET | ORAL | Status: DC | PRN
  Administered 2021-12-11: 18:00:00 1 via ORAL
  Filled 2021-12-08: qty 1

## 2021-12-08 MED ORDER — ACETAMINOPHEN 500 MG PO TABS
1000.0000 mg | ORAL_TABLET | Freq: Once | ORAL | Status: AC
  Administered 2021-12-08: 1000 mg via ORAL
  Filled 2021-12-08: qty 2

## 2021-12-08 MED ORDER — SODIUM CHLORIDE 0.9 % IV SOLN: 75.0000 mL/h | INTRAVENOUS | Status: DC

## 2021-12-08 MED ORDER — SODIUM CHLORIDE 0.9 % IV SOLN
1.0000 g | Freq: Once | INTRAVENOUS | Status: AC
  Administered 2021-12-08: 1 g via INTRAVENOUS
  Filled 2021-12-08: qty 10

## 2021-12-08 NOTE — Assessment & Plan Note (Signed)
-  SIRS criteria met with elevated white blood cell count,    ?   ?Component Value Date/Time  ? WBC 13.1 (H) 12/26/2021 1622  ? LYMPHSABS 1.1 12/12/2021 1622  ? ? ? tachycardia    RR >20 ?Today's Vitals  ? 12/07/2021 1900 01/01/2022 1930 12/22/2021 2000 12/23/2021 2030  ?BP: (!) 131/59 (!) 141/69 (!) 149/59 136/90  ?Pulse: (!) 112 (!) 116 95 (!) 136  ?Resp: (!) 27 (!) 26 (!) 28 (!) 30  ?Temp:      ?TempSrc:      ?SpO2: 90% (!) 88% 93% 93%  ?PainSc:  0-No pain    ? ?There is no height or weight on file to calculate BMI. ? ?This patient meets SIRS Criteria and may be septic. ?   ?The recent clinical data is shown below. ?Vitals:  ? 12/12/2021 1900 12/27/2021 1930 01/02/2022 2000 12/09/2021 2030  ?BP: (!) 131/59 (!) 141/69 (!) 149/59 136/90  ?Pulse: (!) 112 (!) 116 95 (!) 136  ?Resp: (!) 27 (!) 26 (!) 28 (!) 30  ?Temp:      ?TempSrc:      ?SpO2: 90% (!) 88% 93% 93%  ? -Most likely source being  urinary,   ? Patient meeting criteria for Severe sepsis with  ?  evidence of end organ damage/organ dysfunction such as  ?  ?Lab Results  ?Component Value Date  ? CREATININE 0.82 12/24/2021  ? CREATININE 0.82 05/09/2021  ? ? elevated lactic acid >2  ?   ?Component Value Date/Time  ? LATICACIDVEN 2.5 ([place]) 12/25/2021 1622  ? ? acute metabolic encephalopathy   ?  ? - Obtain serial lactic acid and procalcitonin level. ? - Initiated IV antibiotics in ER: ?Antibiotics Given (last 72 hours)   ? Date/Time Action Medication Dose Rate  ? 12/24/2021 1946 New Bag/Given  ? cefTRIAXone (ROCEPHIN) 1 g in sodium chloride 0.9 % 100 mL IVPB 1 g 200 mL/hr  ? 12/25/2021 2026 New Bag/Given  ? azithromycin (ZITHROMAX) 500 mg in sodium chloride 0.9 % 250 mL IVPB 500 mg 250 mL/hr  ?  ? ? ?Will continue  Rocephin azithro ? ? - await results of blood and urine culture ? - Rehydrate aggressively  ?Intravenous fluids were administered  ?  ? ?8:53 PM ? ?

## 2021-12-08 NOTE — Subjective & Objective (Addendum)
Presents with generalized weakness confusion and had a fall.  Usually ambulates with a walker since yesterday has been more confused today she had a fall.  She is on Coumadin husband states she did not hit her head.  Patient has been altered since.  She is onto on oxygen at baseline not able to cooperate for physical exam completely but does not appear to have any local neurological deficits ?On arrival to ER noted to be hypoglycemic down to 26 received glucagon became a bit more responsive.  Patient has insulin-dependent diabetes and dementia. ?

## 2021-12-08 NOTE — Assessment & Plan Note (Signed)
-   most likely multifactorial secondary to combination of  infection   mild dehydration secondary to decreased by mouth intake, polypharmacy  ? - Will rehydrate  ? - treat underlining infection  ? - Hold contributing medications  ? - if no improvement may need further imaging to evaluate for CNS pathology pathology such as MRI of the brain  ? - neurological exam appears to be nonfocal but patient unable to cooperate fully  ? - VBG unremarkable no evidence of hypercarbia  ?  - no history of liver disease ammonia 36 ? ?

## 2021-12-08 NOTE — Assessment & Plan Note (Signed)
-   treat with Rocephin         await results of urine culture and adjust antibiotic coverage as needed  

## 2021-12-08 NOTE — Progress Notes (Addendum)
Critical Value on ABG reported to RN ?K+ 2.5 ? ?BIPAP held. Awaiting MD to review ABG results. No distress noted. ?

## 2021-12-08 NOTE — H&P (Signed)
[name] [name] IWP:[id number] DOB: 12-Mar-1930 DOA: 12/07/2021     PCP: Donnajean Lopes, MD   Outpatient Specialists:  CARDS:   Dr.Hartwick    Patient arrived to ER on 12/22/2021 at 1416 Referred by Attending Carmin Muskrat, MD   Patient coming from:    home Lives  With family    Chief Complaint:   Chief Complaint  Patient presents with   Altered Mental Status    HPI: [name] [name] is a 88 y.o. female with medical history significant of    DM2, CHF systolic diastolic chf, HDL, HTN, dementia. A.fib on coumadin  Presented with   confusion generalized weakness Presents with generalized weakness confusion and had a fall.  Usually ambulates with a walker since yesterday has been more confused today she had a fall.  She is on Coumadin husband states she did not hit her head.  Patient has been altered since.  She is onto on oxygen at baseline not able to cooperate for physical exam completely but does not appear to have any local neurological deficits On arrival to ER noted to be hypoglycemic down to 26 received glucagon became a bit more responsive.  Patient has insulin-dependent diabetes and dementia.     Initial COVID TEST  NEGATIVE   Lab Results  Component Value Date   SARSCOV2NAA NEGATIVE 12/12/2021   SARSCOV2NAA NEGATIVE 05/09/2021   [place] NEGATIVE 05/05/2021   SARSCOV2NAA Detected (A) 06/21/2020     Regarding pertinent Chronic problems:    Hyperlipidemia -  on statins  [facility] Lipid Panel     Component Value Date/Time   CHOL  07/25/2008 0445    124        ATP III CLASSIFICATION:  <200     mg/dL   Desirable  200-239  mg/dL   Borderline High  >=240    mg/dL   High   TRIG 232 (H) 07/25/2008 0445   HDL 31 (L) 07/25/2008 0445   CHOLHDL 4.0 07/25/2008 0445   VLDL 46 (H) 07/25/2008 0445   LDLCALC  07/25/2008 0445    47        Total Cholesterol/HDL:CHD Risk Coronary Heart Disease Risk Table                     Men   Women  1/2 Average Risk   3.4    3.3     HTN on diltiazem. metoprolol  MItral valve Mild regurgitation.      DM 2 -  Lab Results  Component Value Date   HGBA1C 7.8 (H) 05/06/2021   on insulin, PO meds   Run out of her strips       COPD - not  not on baseline oxygen    Albuterol symbicort    A. Fib -  - CHA2DS2 vas score   6    current  on anticoagulation with  Coumadin            -  Rate control:  Currently controlled with  Metoprolol,  Diltiazem,     Dementia - not on meds    Chronic anemia - baseline hg Hemoglobin & Hematocrit  Recent Labs    05/08/21 0217 12/20/2021 1622 12/27/2021 1638  HGB 12.5 11.6* 11.9*    While in ER:    Bilateral interstitial lung markings possible edema versus atypical viral pneumonia. Evidence of UTI meeting criteria for sepsis  Ordered  CT HEAD   NON acute Subtle frontoparietal scalp soft tissue swelling  CXR -edema versus atypical pneumonia ° hips no fracture dislocation °  °Following Medications were ordered in ER: °Medications  °azithromycin (ZITHROMAX) 500 mg in sodium chloride 0.9 % 250 mL IVPB (500 mg Intravenous New Bag/Given 12/26/2021 2026)  °dextrose 50 % solution 50 mL ( Intravenous Given 12/03/2021 1609)  °albuterol (PROVENTIL) (2.5 MG/3ML) 0.083% nebulizer solution 5 mg (5 mg Nebulization Given 12/27/2021 1733)  °cefTRIAXone (ROCEPHIN) 1 g in sodium chloride 0.9 % 100 mL IVPB (0 g Intravenous Stopped 12/07/2021 2026)  °acetaminophen (TYLENOL) tablet 1,000 mg (1,000 mg Oral Given 12/29/2021 1946)  °dextrose 50 % solution 50 mL (50 mLs Intravenous Given  1938)  °  °_________  °  °ED Triage Vitals  °Enc Vitals Group  °   BP 12/17/2021 1424 (!) 159/87  °   Pulse Rate 12/19/2021 1424 98  °   Resp 12/14/2021 1424 20  °   Temp  1424 97.7 °F (36.5 °C)  °   Temp Source 12/04/2021 1424 Axillary  °   SpO2 12/30/2021 1424 94 %  °   Weight --   °   Height --   °   Head Circumference --   °   Peak Flow --   °   Pain Score 12/16/2021 1820 0  °   Pain Loc --   °   Pain Edu? --   °   Excl. in GC? --    °TMAX(24)@    ° _________________________________________ °Significant initial  Findings: °Abnormal Labs Reviewed  °COMPREHENSIVE METABOLIC PANEL - Abnormal; Notable for the following components:  °    Result Value  ° Sodium 130 (*)   ° Chloride 94 (*)   ° Glucose, Bld 236 (*)   ° Calcium 8.1 (*)   ° AST 48 (*)   ° All other components within normal limits  °CBC WITH DIFFERENTIAL/PLATELET - Abnormal; Notable for the following components:  ° WBC 13.1 (*)   ° Hemoglobin 11.6 (*)   ° Neutro Abs 11.1 (*)   ° Abs Immature Granulocytes 0.14 (*)   ° All other components within normal limits  °URINALYSIS, ROUTINE W REFLEX MICROSCOPIC - Abnormal; Notable for the following components:  ° APPearance TURBID (*)   ° Hgb urine dipstick SMALL (*)   ° Ketones, ur 5 (*)   ° Protein, ur 100 (*)   ° Nitrite POSITIVE (*)   ° Leukocytes,Ua LARGE (*)   ° WBC, UA >50 (*)   ° Bacteria, UA MANY (*)   ° All other components within normal limits  °AMMONIA - Abnormal; Notable for the following components:  ° Ammonia 38 (*)   ° All other components within normal limits  °LACTIC ACID, PLASMA - Abnormal; Notable for the following components:  ° Lactic Acid, Venous 2.5 (*)   ° All other components within normal limits  °APTT - Abnormal; Notable for the following components:  ° aPTT 22 (*)   ° All other components within normal limits  °PROTIME-INR - Abnormal; Notable for the following components:  ° Prothrombin Time 16.9 (*)   ° INR 1.4 (*)   ° All other components within normal limits  °BRAIN NATRIURETIC PEPTIDE - Abnormal; Notable for the following components:  ° B Natriuretic Peptide 412.0 (*)   ° All other components within normal limits  °CK - Abnormal; Notable for the following components:  ° Total CK 273 (*)   ° All other components within normal limits  °DIFFERENTIAL - Abnormal; Notable for the following components:  °   Abs Immature Granulocytes 0.08 (*)   ° All other components within normal limits  °HEPATIC FUNCTION PANEL - Abnormal;  Notable for the following components:  ° Total Protein 5.6 (*)   ° Albumin 2.6 (*)   ° All other components within normal limits  °MAGNESIUM - Abnormal; Notable for the following components:  ° Magnesium 1.5 (*)   ° All other components within normal limits  °PHOSPHORUS - Abnormal; Notable for the following components:  ° Phosphorus 2.3 (*)   ° All other components within normal limits  °IRON AND TIBC - Abnormal; Notable for the following components:  ° Saturation Ratios 10 (*)   ° All other components within normal limits  °RETICULOCYTES - Abnormal; Notable for the following components:  ° RBC. 3.41 (*)   ° Immature Retic Fract 27.7 (*)   ° All other components within normal limits  °CBC - Abnormal; Notable for the following components:  ° RBC 3.35 (*)   ° Hemoglobin 9.0 (*)   ° HCT 28.7 (*)   ° RDW 15.7 (*)   ° All other components within normal limits  °CBG MONITORING, ED - Abnormal; Notable for the following components:  ° Glucose-Capillary 26 (*)   ° All other components within normal limits  °I-STAT VENOUS BLOOD GAS, ED - Abnormal; Notable for the following components:  ° pH, Ven 7.458 (*)   ° pCO2, Ven 37.3 (*)   ° pO2, Ven 60 (*)   ° Acid-Base Excess 3.0 (*)   ° Sodium 128 (*)   ° Calcium, Ion 0.93 (*)   ° HCT 35.0 (*)   ° Hemoglobin 11.9 (*)   ° All other components within normal limits  °CBG MONITORING, ED - Abnormal; Notable for the following components:  ° Glucose-Capillary 155 (*)   ° All other components within normal limits  °CBG MONITORING, ED - Abnormal; Notable for the following components:  ° Glucose-Capillary 54 (*)   ° All other components within normal limits  °CBG MONITORING, ED - Abnormal; Notable for the following components:  ° Glucose-Capillary 108 (*)   ° All other components within normal limits  °CBG MONITORING, ED - Abnormal; Notable for the following components:  ° Glucose-Capillary 68 (*)   ° All other components within normal limits  °I-STAT ARTERIAL BLOOD GAS, ED - Abnormal;  Notable for the following components:  ° pH, Arterial 7.519 (*)   ° pCO2 arterial 31.8 (*)   ° pO2, Arterial 82 (*)   ° Acid-Base Excess 3.0 (*)   ° Sodium 133 (*)   ° Potassium 2.6 (*)   ° Calcium, Ion 1.03 (*)   ° HCT 31.0 (*)   ° Hemoglobin 10.5 (*)   ° All other components within normal limits  °CBG MONITORING, ED - Abnormal; Notable for the following components:  ° Glucose-Capillary 53 (*)   ° All other components within normal limits  °CBG MONITORING, ED - Abnormal; Notable for the following components:  ° Glucose-Capillary 162 (*)   ° All other components within normal limits  °TROPONIN I (HIGH SENSITIVITY) - Abnormal; Notable for the following components:  ° Troponin I (High Sensitivity) 73 (*)   ° All other components within normal limits  ° °  °_________________________ °Troponin 73 °ECG: Ordered °Personally reviewed by me showing: °HR : 107 °Rhythm:  A.fib. W RVR, RBBB,   ° , no evidence of ischemic changes °QTC 458 °  °____________________ °This patient meets SIRS Criteria and may be septic. °   °The   recent clinical data is shown below. °Vitals:  ° 12/17/2021 1900 12/17/2021 1930 12/19/2021 2000 12/10/2021 2030  °BP: (!) 131/59 (!) 141/69 (!) 149/59 136/90  °Pulse: (!) 112 (!) 116 95 (!) 136  °Resp: (!) 27 (!) 26 (!) 28 (!) 30  °Temp:      °TempSrc:      °SpO2: 90% (!) 88% 93% 93%  °  °WBC ° °   °Component Value Date/Time  ° WBC 13.1 (H) 12/28/2021 1622  ° LYMPHSABS 1.1 12/16/2021 1622  ° MONOABS 0.7 12/27/2021 1622  ° EOSABS 0.0 12/25/2021 1622  ° BASOSABS 0.0 12/24/2021 1622  °  °Lactic Acid, Venous °   °Component Value Date/Time  ° LATICACIDVEN 2.5 (HH) 12/16/2021 1622  ° ° ° °Procalcitonin <0.10 °Lactic Acid, Venous °   °Component Value Date/Time  ° LATICACIDVEN 2.5 (HH) 12/07/2021 1622  °  ° ° UA   evidence of UTI    °  °Urine analysis: °   °Component Value Date/Time  ° COLORURINE YELLOW 12/29/2021 1848  ° APPEARANCEUR TURBID (A) 12/05/2021 1848  ° LABSPEC 1.010 12/16/2021 1848  ° PHURINE 6.0 12/20/2021  1848  ° GLUCOSEU NEGATIVE 12/04/2021 1848  ° HGBUR SMALL (A) 12/10/2021 1848  ° BILIRUBINUR NEGATIVE 12/16/2021 1848  ° KETONESUR 5 (A) 12/22/2021 1848  ° PROTEINUR 100 (A) 12/31/2021 1848  ° UROBILINOGEN 0.2 11/23/2013 1431  ° NITRITE POSITIVE (A) 12/09/2021 1848  ° LEUKOCYTESUR LARGE (A) 01/02/2022 1848  ° ° °Results for orders placed or performed during the hospital encounter of 01/01/2022  °Resp Panel by RT-PCR (Flu A&B, Covid) Peripheral     Status: None  ° Collection Time: 12/22/2021  6:22 PM  ° Specimen: Peripheral; Nasopharyngeal(NP) swabs in vial transport medium  °Result Value Ref Range Status  ° SARS Coronavirus 2 by RT PCR NEGATIVE NEGATIVE Final  °      ° Influenza A by PCR NEGATIVE NEGATIVE Final  ° Influenza B by PCR NEGATIVE NEGATIVE Final  °      ° °  °_______________________________________________ °Hospitalist was called for admission for sepsis due to CAP and UTI, hypoglycemia,  ° °The following Work up has been ordered so far: ° °Orders Placed This Encounter  °Procedures  ° Urine Culture  ° Blood Cultures (routine x 2)  ° Resp Panel by RT-PCR (Flu A&B, Covid) Nasopharyngeal Swab  ° CT HEAD WO CONTRAST  ° DG Chest 1 View  ° DG Hip Unilat W or Wo Pelvis 2-3 Views Right  ° Comprehensive metabolic panel  ° CBC WITH DIFFERENTIAL  ° Urinalysis, Routine w reflex microscopic  ° Ammonia  ° Lactic acid, plasma  ° Ethanol  ° Urine rapid drug screen (hosp performed)  ° APTT  ° Protime-INR  ° Brain natriuretic peptide  ° Cardiac monitoring  ° Initiate Carrier Fluid Protocol  ° Consult to hospitalist  ° Pulse oximetry, continuous  ° CBG monitoring, ED  ° I-Stat venous blood gas, ED  ° CBG monitoring, ED  ° CBG monitoring, ED  ° ED EKG  ° EKG 12-Lead  ° Insert peripheral IV  ° Saline lock IV  °  ° °OTHER Significant initial  Findings: ° °labs showing: ° °  °Recent Labs  °Lab 12/16/2021 °1622 12/09/2021 °1638  °NA 130* 128*  °K 3.8 3.9  °CO2 23  --   °GLUCOSE 236*  --   °BUN 12  --   °CREATININE 0.82  --   °CALCIUM  8.1*  --   ° ° °Cr    stable,    Lab Results  Component Value Date   CREATININE 0.82 12/31/2021   CREATININE 0.82 05/09/2021   CREATININE 1.00 05/08/2021    Recent Labs  Lab 12/29/2021 1622  AST 48*  ALT 19  ALKPHOS 60  BILITOT 0.7  PROT 6.9  ALBUMIN 3.5   Lab Results  Component Value Date   CALCIUM 8.1 (L) 12/04/2021   PHOS 2.7 10/31/2018          Plt: Lab Results  Component Value Date   PLT  12/07/2021    PLATELET CLUMPS NOTED ON SMEAR, COUNT APPEARS ADEQUATE      COVID-19 Labs  Recent Labs    12/23/2021 2300  FERRITIN 61    Lab Results  Component Value Date   SARSCOV2NAA NEGATIVE 05/09/2021   SARSCOV2NAA NEGATIVE 05/05/2021   SARSCOV2NAA Detected (A) 06/21/2020       Recent Labs  Lab 12/19/2021 1622 12/19/2021 1638  WBC 13.1*  --   NEUTROABS 11.1*  --   HGB 11.6* 11.9*  HCT 38.0 35.0*  MCV 87.2  --   PLT PLATELET CLUMPS NOTED ON SMEAR, COUNT APPEARS ADEQUATE  --     HG/HCT   Down      Component Value Date/Time   HGB 11.9 (L) 12/21/2021 1638   HCT 35.0 (L) 12/11/2021 1638   MCV 87.2 12/21/2021 1622      No results for input(s): LIPASE, AMYLASE in the last 168 hours. Recent Labs  Lab 12/26/2021 1622  AMMONIA 38*      Cardiac Panel (last 3 results) Recent Labs    12/05/2021 2300  CKTOTAL 273*    .car BNP (last 3 results) Recent Labs    05/06/21 0057 12/31/2021 1932  BNP 103.0* 412.0*     DM  labs:  HbA1C: Recent Labs    05/06/21 0057  HGBA1C 7.8*      CBG (last 3)  Recent Labs    12/18/2021 1549 12/16/2021 1646 12/20/2021 1931  GLUCAP 26* 155* 54*    Cultures:    Component Value Date/Time   SDES URINE, RANDOM 11/26/2018 0015   SPECREQUEST NONE 11/26/2018 0015   CULT  11/26/2018 0015    NO GROWTH Performed at [facility], [place] [address] [place], [place] [address]    REPTSTATUS 11/27/2018 FINAL 11/26/2018 0015     Radiological Exams on Admission: DG Chest 1 View  Result Date: 12/10/2021 CLINICAL DATA:  Altered  mental status. EXAM: CHEST  1 VIEW COMPARISON:  Prior chest radiographs 11/25/2018. FINDINGS: Heart size at the upper limits of normal, unchanged. Aortic atherosclerosis. Mild bilateral interstitial prominence suggesting interstitial edema. No definite airspace consolidation. No evidence of pleural effusion or pneumothorax. No acute bony abnormality identified. IMPRESSION: Prominence of the bilateral interstitial lung markings, suggesting interstitial edema. Atypical/viral pneumonia can also have this appearance and clinical correlation is recommended. Aortic Atherosclerosis (ICD10-I70.0). Redemonstrated prominent mitral annular calcifications. Electronically Signed   By: Kellie Simmering D.O.   On: 12/15/2021 15:12   CT HEAD WO CONTRAST  Result Date: 12/24/2021 CLINICAL DATA:  Mental status change, unknown cause. Additional history provided: Generalized weakness, altered mental status, fall. EXAM: CT HEAD WITHOUT CONTRAST TECHNIQUE: Contiguous axial images were obtained from the base of the skull through the vertex without intravenous contrast. RADIATION DOSE REDUCTION: This exam was performed according to the departmental dose-optimization program which includes automated exposure control, adjustment of the mA and/or kV according to patient size and/or use of iterative reconstruction technique. COMPARISON:  Prior  head CT examinations 11/26/2018 and earlier. Brain MRI 05/16/2018. FINDINGS: Brain: Moderate generalized cerebral atrophy. Comparatively mild cerebellar atrophy. Mild patchy and ill-defined hypoattenuation within the cerebral white matter, nonspecific but compatible with chronic small vessel ischemic disease. There is no acute intracranial hemorrhage. No demarcated cortical infarct. No extra-axial fluid collection. No evidence of an intracranial mass. No midline shift. Vascular: No hyperdense vessel.  Atherosclerotic calcifications. Skull: Normal. Negative for fracture or focal lesion. Sinuses/Orbits:  Visualized orbits show no acute finding. Mild mucosal thickening within the bilateral frontoethmoidal recesses. Mild mucosal thickening and fluid within the bilateral ethmoid sinuses. Mild mucosal thickening within the bilateral sphenoid sinuses. Trace mucosal thickening within the left maxillary sinus at the imaged levels. Other: Subtle frontoparietal scalp soft tissue swelling (series 5, image 30). IMPRESSION: No evidence of acute intracranial abnormality. Subtle frontoparietal scalp soft tissue swelling slightly eccentric to the right. Mild chronic small vessel image changes within the cerebral white matter, stable from the prior head CT of 11/26/2018. Moderate generalized cerebral atrophy. Comparatively mild cerebellar atrophy. Paranasal sinus disease at the imaged levels, as described. Electronically Signed   By: Kellie Simmering D.O.   On: 12/28/2021 15:25   DG Hip Unilat W or Wo Pelvis 2-3 Views Right  Result Date: 12/12/2021 CLINICAL DATA:  Fall EXAM: DG HIP (WITH OR WITHOUT PELVIS) 2-3V RIGHT COMPARISON:  None. FINDINGS: Status post left hip hemiarthroplasty with no evidence of hardware fracture or loosening. No evidence of left hip dislocation on this single frontal view. No pelvic fracture or diastasis. No right hip fracture or dislocation. Diffuse osteopenia. No aggressive appearing focal osseous lesions. Mild right hip osteoarthritis. Degenerative changes in the visualized lower lumbar spine. IMPRESSION: No osseous fracture. No right hip dislocation. Status post left hip hemiarthroplasty without evidence of hardware complication. Diffuse osteopenia. Electronically Signed   By: Ilona Sorrel M.D.   On: 12/07/2021 17:10   _______________________________________________________________________________________________________ Latest  Blood pressure 136/90, pulse (!) 136, temperature 97.7 F (36.5 C), temperature source Axillary, resp. rate (!) 30, SpO2 93 %.   Vitals  labs and radiology finding  personally reviewed  Review of Systems:    Pertinent positives include:  fatigue,confusion  Constitutional:  No weight loss, night sweats, Fevers, chills,  weight loss  HEENT:  No headaches, Difficulty swallowing,Tooth/dental problems,Sore throat,  No sneezing, itching, ear ache, nasal congestion, post nasal drip,  Cardio-vascular:  No chest pain, Orthopnea, PND, anasarca, dizziness, palpitations.no Bilateral lower extremity swelling  GI:  No heartburn, indigestion, abdominal pain, nausea, vomiting, diarrhea, change in bowel habits, loss of appetite, melena, blood in stool, hematemesis Resp:  no shortness of breath at rest. No dyspnea on exertion, No excess mucus, no productive cough, No non-productive cough, No coughing up of blood.No change in color of mucus.No wheezing. Skin:  no rash or lesions. No jaundice GU:  no dysuria, change in color of urine, no urgency or frequency. No straining to urinate.  No flank pain.  Musculoskeletal:  No joint pain or no joint swelling. No decreased range of motion. No back pain.  Psych:  No change in mood or affect. No depression or anxiety. No memory loss.  Neuro: no localizing neurological complaints, no tingling, no weakness, no double vision, no gait abnormality, no slurred speech, no   All systems reviewed and apart from [place] all are negative _______________________________________________________________________________________________ Past Medical History:   Past Medical History:  Diagnosis Date   Atrial fibrillation ([place])    RVR hx.  Chronic Coumadin   Bacteremia 10/2018  C. difficile colitis 09/21/2013, 11/2013  ° Carotid stenosis   ° a. Carotid US (10/2013):  bilat 1-39%; f/u 1 year  ° Chronic lower back pain   ° COPD (chronic obstructive pulmonary disease) (HCC)   ° Depression   ° Diverticulosis of colon without hemorrhage   ° Educated about COVID-19 virus infection 09/11/2019  ° Gait abnormality 04/11/2020  ° GERD (gastroesophageal  reflux disease)   ° with HH  ° Glaucoma   ° HCAP (healthcare-associated pneumonia) 11/21/2011  ° High cholesterol   ° Hypertension   ° Influenza A 11/28/2018  ° Insomnia   ° Memory disorder 04/11/2020  ° Metabolic encephalopathy 04/12/2012  ° Mitral regurgitation 03/11/2012  ° Pain due to onychomycosis of toenails of both feet 01/29/2021  ° Pain in right foot 10/11/2018  ° Pancreatitis   ° ~2008, 11/2011 post ERCP  ° Pneumonia 12/2011  ° "first time I know about"  ° PVD (peripheral vascular disease) (HCC)   ° Rupture of right rotator cuff 01/30/2020  ° Sepsis (HCC) 11/27/2013  ° SIRS (systemic inflammatory response syndrome) (HCC) 03/13/2012  ° a/w post ERCP  pancreatitis.   ° Syncope   ° T12 compression fracture, sequela 05/05/2021  ° Type II diabetes mellitus (HCC)   ° Unsteady gait   ° °  ° °Past Surgical History:  °Procedure Laterality Date  ° APPENDECTOMY  ~ 1960  ° CATARACT EXTRACTION W/ INTRAOCULAR LENS  IMPLANT, BILATERAL  2000's  ° CHOLECYSTECTOMY  1990's  ° Lap chole  ° COLONOSCOPY  01/2003  ° diverticulosis, 3mm hyperplastic sigmoid polyp  ° COLONOSCOPY WITH PROPOFOL N/A 06/02/2018  ° Procedure: COLONOSCOPY WITH PROPOFOL;  Surgeon: Cirigliano, Vito V, DO;  Location: [facility];  Service: Gastroenterology;  Laterality: N/A;  ° DILATION AND CURETTAGE OF UTERUS    ° ERCP  11/13/2011  ° Procedure: ENDOSCOPIC RETROGRADE CHOLANGIOPANCREATOGRAPHY (ERCP);  Surgeon: Patrick D Hung, MD;  Location: [facility];  Service: Endoscopy;  Laterality: N/A;  ° FLEXIBLE SIGMOIDOSCOPY N/A 05/02/2014  ° Procedure: FLEXIBLE SIGMOIDOSCOPY;  Surgeon: Carl E Gessner, MD;  Location: [facility];  Service: Endoscopy;  Laterality: N/A;  ° HIP ARTHROPLASTY Left 01/07/2018  ° Procedure: ARTHROPLASTY BIPOLAR HIP (HEMIARTHROPLASTY);  Surgeon: Varkey, Dax T, MD;  Location: [facility] OR;  Service: Orthopedics;  Laterality: Left;  ° TONSILLECTOMY AND ADENOIDECTOMY  ~ 1939  ° VAGINAL HYSTERECTOMY  ~ 1960's  ° ° °Social History: ° °Ambulatory  walker    °  °  reports  that she has never smoked. She has never used smokeless tobacco. She reports that she does not drink alcohol and does not use drugs. °  °Family History: °  °Family History  °Problem Relation Age of Onset  ° Stroke Father 90  ° Stroke Mother 85  ° Diabetes Mellitus II Mother   ° Heart disease Mother   ° Hypertension Mother   ° Diabetes Mellitus II Sister   ° Diabetes Mellitus II Brother   ° Anesthesia problems Neg Hx   ° Hypotension Neg Hx   ° Malignant hyperthermia Neg Hx   ° Pseudochol deficiency Neg Hx   ° Stomach cancer Neg Hx   ° Esophageal cancer Neg Hx   ° °______________________________________________________________________________________________ °Allergies: °Allergies  °Allergen Reactions  ° Ciprofloxacin Swelling  °  Site of swelling not recalled  ° Lactose Intolerance (Gi) Nausea And Vomiting and Other (See Comments)  °  severe stomach pain  ° Penicillins Hives and Itching  °  "haven't used any since 1970's" -   have gotten cephalosporins multiple times °Has patient had a PCN reaction causing immediate rash, facial/tongue/throat swelling, SOB or lightheadedness with hypotension: Yes °Has patient had a PCN reaction causing severe rash involving mucus membranes or skin necrosis: Unk °Has patient had a PCN reaction that required hospitalization: Unk °Has patient had a PCN reaction occurring within the last 10 years: No °If all of the above answers are "NO", then may proceed with C  ° Sulfa Antibiotics Hives and Itching  °  "haven't used any since 1970's"  ° Sulfasalazine Hives and Itching  °  "haven't used any since 1970's"  ° Lisinopril Other (See Comments)  °  Other reaction(s): associated with hyperkalemia  ° ° ° °Prior to Admission medications   °Medication Sig Start Date End Date Taking? Authorizing Provider  °acetaminophen (TYLENOL) 325 MG tablet Take 650 mg by mouth every 6 (six) hours as needed for mild pain.     [provider]  °albuterol (PROVENTIL) (2.5 MG/3ML) 0.083% nebulizer solution  take 1 treatment up to 3 times daily as needed for difficulty breathing 07/01/18   [provider]  °BD PEN NEEDLE NANO U/F 32G X 4 MM MISC  05/13/20   [provider]  °diltiazem (DILT-XR) 120 MG 24 hr capsule TAKE 1 CAPSULE(120 MG) BY MOUTH DAILY °Patient taking differently: Take 120 mg by mouth daily. 01/04/18   Hochrein, James, MD  °doxepin (SINEQUAN) 10 MG capsule Take 10 mg by mouth at bedtime. 11/02/19   [provider]  °furosemide (LASIX) 40 MG tablet Take 0.5 tablets (20 mg total) by mouth daily. Take one tablet (40 mg) by mouth once a day 05/09/21   Joseph, Preetha, MD  °gabapentin (NEURONTIN) 100 MG capsule Take 100 mg by mouth daily. 04/08/20   [provider]  °glimepiride (AMARYL) 4 MG tablet Take 4 mg by mouth daily with breakfast. 11/01/19   [provider]  °insulin glargine, 1 Unit Dial, (TOUJEO) 300 UNIT/ML Solostar Pen Inject 8 Units into the skin every morning. 05/09/21   Joseph, Preetha, MD  °latanoprost (XALATAN) 0.005 % ophthalmic solution Place 1 drop into both eyes at bedtime.    [provider]  °lidocaine (LIDODERM) 5 % Place 1 patch onto the skin daily. Remove & Discard patch within 12 hours or as directed by MD 05/09/21   Joseph, Preetha, MD  °loperamide (LOPERAMIDE A-D) 2 MG tablet Take 1 tablet (2 mg total) by mouth as needed for diarrhea or loose stools (use at bedtime). °Patient taking differently: Take 1-2 mg by mouth at bedtime as needed for diarrhea or loose stools. 11/27/14   Gessner, Carl E, MD  °metoprolol tartrate (LOPRESSOR) 25 MG tablet Take 12.5 mg by mouth 2 (two) times daily.    [provider]  °Multiple Vitamin (MULTIVITAMIN WITH MINERALS) TABS Take 1 tablet by mouth daily.    [provider]  °mupirocin ointment (BACTROBAN) 2 % Apply 1 application topically 2 (two) times daily. 04/17/21   [provider]  °ondansetron (ZOFRAN) 4 MG tablet Take 4 mg by mouth every 8 (eight) hours as needed for nausea or  vomiting. 05/06/20   [provider]  °oxybutynin (DITROPAN) 5 MG tablet Take 5 mg by mouth at bedtime.  07/29/16   [provider]  °pantoprazole (PROTONIX) 40 MG tablet Take 1 tablet (40 mg total) by mouth 2 (two) times daily. 06/07/18 11/27/27  Arrien, Mauricio Daniel, MD  °pilocarpine (PILOCAR) 1 % ophthalmic solution Place 1 drop into both eyes 2 (  two) times daily. 08/28/20   [provider]  °Probiotic Product (PROBIOTIC PO) Take 1 capsule by mouth daily.    [provider]  °rOPINIRole (REQUIP) 0.5 MG tablet Take 0.5 mg by mouth at bedtime.    [provider]  °rosuvastatin (CRESTOR) 10 MG tablet Take 10 mg by mouth daily. 11/01/19   [provider]  °SYMBICORT 160-4.5 MCG/ACT inhaler Inhale 2 puffs into the lungs 2 (two) times daily. 03/08/14   [provider]  °timolol (TIMOPTIC) 0.5 % ophthalmic solution Place 1 drop into both eyes 2 (two) times daily. 09/21/19   [provider]  °warfarin (COUMADIN) 2.5 MG tablet Take 1.25-2.5 mg by mouth daily. Take 2.5 mg on Mon,Wed,Fri. Take 1.25 mg all other days 04/08/20   [provider]  ° ° °___________________________________________________________________________________________________ °Physical Exam: °Vitals with BMI 12/07/2021 12/18/2021 12/06/2021  °Height - - -  °Weight - - -  °BMI - - -  °Systolic 136 149 141  °Diastolic 90 59 69  °Pulse 136 95 116  ° ° ° °1. General:  in No  Acute distress  °  Chronically ill   °-appearing °2. Psychological: Alert and   Oriented to self °3. Head/ENT:  Dry Mucous Membranes °                         Head Non traumatic, neck supple °                        Poor Dentition °4. SKIN:  decreased Skin turgor,  Skin clean Dry and intact no rash °5. Heart: Regular rate and rhythm systolic Murmur, no Rub or gallop °6. Lungs:  some wheezes mild crackles   °7. Abdomen: Soft,  non-tender, Non distended   obese  bowel sounds present °8. Lower extremities: no clubbing,  cyanosis, no  edema °9. Neurologically Grossly intact, moving all 4 extremities equally   °10. MSK: Normal range of motion °  ° °Chart has been reviewed ° °______________________________________________________________________________________________ ° °Assessment/Plan °88 y.o. female with medical history significant of   ° DM2, CHF systolic diastolic chf, HDL, HTN, dementia. A.fib on coumadin °   Admitted for sepsis due to CAP and UTI, hypoglycemia,  °Possible pulmonary edema ° °Present on Admission: ° UTI (urinary tract infection) ° HTN (hypertension) ° Hypercholesteremia ° Atrial fibrillation  ° COPD (chronic obstructive pulmonary disease) (HCC) ° Acute encephalopathy ° Severe sepsis (HCC) ° Hyponatremia ° CAP (community acquired pneumonia) ° Chronic respiratory failure with hypoxia (HCC) ° Elevated troponin ° Pulmonary edema ° °  ° °UTI (urinary tract infection) ° - treat with Rocephin  ° °      await results of urine culture and adjust antibiotic coverage as needed ° ° °DM II (diabetes mellitus, type II), controlled (HCC) °Hold home medications given persistent hypoglycemia ° °HTN (hypertension) °Allow permissive hypertension given sepsis ° °Hypercholesteremia °Restart her Crestor when able to take p.o. ° °Atrial fibrillation  °On Lopressor and Coumadin check INR resume home medications when able to tolerate ° °COPD (chronic obstructive pulmonary disease) (HCC) °Chronic stable resume home medications albuterol as needed Symbicort ° °Acute encephalopathy °  - most likely multifactorial secondary to combination of  infection   mild dehydration secondary to decreased by mouth intake, polypharmacy  ° - Will rehydrate  ° - treat underlining infection  ° - Hold contributing medications  ° - if no improvement may need further imaging to evaluate   for CNS pathology pathology such as MRI of the brain  ° - neurological exam appears to be nonfocal but patient unable to cooperate fully  ° - VBG unremarkable no evidence of  hypercarbia  °  - no history of liver disease ammonia 36 ° ° °Severe sepsis (HCC) ° -SIRS criteria met with elevated white blood cell count,    °   °Component Value Date/Time  ° WBC 13.1 (H)  1622  ° LYMPHSABS 1.1  1622  ° ° ° tachycardia    RR >20 °Today's Vitals  ° 12/28/2021 1900 12/20/2021 1930 12/27/2021 2000 12/11/2021 2030  °BP: (!) 131/59 (!) 141/69 (!) 149/59 136/90  °Pulse: (!) 112 (!) 116 95 (!) 136  °Resp: (!) 27 (!) 26 (!) 28 (!) 30  °Temp:      °TempSrc:      °SpO2: 90% (!) 88% 93% 93%  °PainSc:  0-No pain    ° °There is no height or weight on file to calculate BMI. ° °This patient meets SIRS Criteria and may be septic. °   °The recent clinical data is shown below. °Vitals:  ° 12/21/2021 1900 12/22/2021 1930 12/07/2021 2000 12/14/2021 2030  °BP: (!) 131/59 (!) 141/69 (!) 149/59 136/90  °Pulse: (!) 112 (!) 116 95 (!) 136  °Resp: (!) 27 (!) 26 (!) 28 (!) 30  °Temp:      °TempSrc:      °SpO2: 90% (!) 88% 93% 93%  ° -Most likely source being  urinary,   ° Patient meeting criteria for Severe sepsis with  °  evidence of end organ damage/organ dysfunction such as  °  °Lab Results  °Component Value Date  ° CREATININE 0.82 12/10/2021  ° CREATININE 0.82 05/09/2021  ° ° elevated lactic acid >2  °   °Component Value Date/Time  ° LATICACIDVEN 2.5 (HH) 12/18/2021 1622  ° ° acute metabolic encephalopathy   °  ° - Obtain serial lactic acid and procalcitonin level. ° - Initiated IV antibiotics in ER: °Antibiotics Given (last 72 hours)   ° ° Date/Time Action Medication Dose Rate  ° 12/23/2021 1946 New Bag/Given  ° cefTRIAXone (ROCEPHIN) 1 g in sodium chloride 0.9 % 100 mL IVPB 1 g 200 mL/hr  ° 12/20/2021 2026 New Bag/Given  ° azithromycin (ZITHROMAX) 500 mg in sodium chloride 0.9 % 250 mL IVPB 500 mg 250 mL/hr  ° °  ° ° °Will continue  Rocephin azithro ° ° - await results of blood and urine culture ° - Rehydrate aggressively  °Intravenous fluids were administered  °  ° °8:53 PM ° ° °Hyponatremia °Order urine  electrolytes gently rehydrate check TSH ° °CAP (community acquired pneumonia) ° - -Patient presenting with   Hypoxia and infiltrate  resuscitation). ° ° will admit for treatment of CAP will start on appropriate antibiotic coverage. - Rocephin/azithromycin °  Obtain:  sputum cultures, °               Obtain respiratory panel  °                COVID °                  Pending but unlikely °                 blood cultures and sputum cultures ordered  °                 strep pneumo UA antigen,  °                    check for Legionella antigen.                Provide oxygen as needed.     Chronic respiratory failure with hypoxia (HCC) On 3L of O2 at this time continue oxygen and titrate as need    Elevated troponin Most likely demand ischemia in the setting of acute respiratory failure and increased work of breathing.  Obtain echogram continue to cycle troponins and order serial EKG  Pulmonary edema Chest x-ray x-ray clear COVID infectious versus pulmonary edema patient has not have a fever Negative procalcitonin swallow eval BNP and troponin Suggestive possibly of multiple fluid overload picture Soft blood pressures would be very difficult to diuresis.  Obtain echogram patient would benefit from cardiology consult  Other plan as per orders.  DVT prophylaxis:  on coumadin    Code Status:    Code Status: DNR  DNR/DNI  per patient  family  I had personally discussed CODE STATUS with patient and family   Family Communication:   Family  at  Bedside  plan of care was discussed   with  Husband   Disposition Plan:     To home once workup is complete and patient is stable   Following barriers for discharge:                            Electrolytes corrected                               Anemia corrected                             Pain controlled with PO medications                               Afebrile, white count improving able to transition to PO antibiotics                             Will  need to be able to tolerate PO                            Will likely need home health, home O2, set up                           Will need consultants to evaluate patient prior to discharge                        Would benefit from PT/OT eval prior to DC  Ordered                   Swallow eval - SLP ordered                   Diabetes care coordinator                   Transition of care consulted                   Nutrition    consulted  Consults called: [facility]  Admission status:  ED Disposition     ED Disposition  Admit   Condition  --   [place]: [place] [[id number]]  Level of Care: Progressive [102]  Admit to Progressive based on following criteria: CARDIOVASCULAR & THORACIC of moderate stability with acute coronary syndrome symptoms/low risk myocardial infarction/hypertensive urgency/arrhythmias/heart failure potentially compromising stability and stable post cardiovascular intervention patients.  Admit to Progressive based on following criteria: MULTISYSTEM THREATS such as stable sepsis, metabolic/electrolyte imbalance with or without encephalopathy that is responding to early treatment.  May admit [facility] to [facility] or [facility] if equivalent level of care is available:: No  Covid Evaluation: Asymptomatic Screening Protocol (No Symptoms)  Diagnosis: Severe sepsis [facility]) [[id number]]  Admitting Physician: Toy Baker [[id number]]  Attending Physician: Toy Baker [[id number]]  Estimated length of stay: past midnight tomorrow  Certification:: I certify this patient will need inpatient services for at least 2 midnights             inpatient     I Expect 2 midnight stay secondary to severity of patient's current illness need for inpatient interventions justified by the following:  hemodynamic instability despite optimal treatment (tachycardia  hypoxia,     Severe lab/radiological/exam  abnormalities including:     and extensive comorbidities including:     DM2   CHF  CAD  COPD/asthma  Dementia  Chronic anticoagulation  That are currently affecting medical management.   I expect  patient to be hospitalized for 2 midnights requiring inpatient medical care.  Patient is at high risk for adverse outcome (such as loss of life or disability) if not treated.  Indication for inpatient stay as follows:  Severe change from baseline regarding mental status     Need for IV antibiotics, IV fluids,     Level of care        progressive tele indefinitely please discontinue once patient no longer qualifies COVID-19 Labs    Lab Results  Component Value Date   [place] 12/24/2021     Precautions: admitted as   Covid Negative      Critical   Patient is critically ill due to  hemodynamic instability respiratory failure  severe sepsis  They are at high risk for life/limb threatening clinical deterioration requiring frequent reassessment and modifications of care.  Services provided include examination of the patient, review of relevant ancillary tests, prescription of lifesaving therapies, review of medications and prophylactic therapy.  Total critical care time excluding separately billable procedures: 60  Minutes.   Kalub Morillo 12/09/2021, 2:35 AM    [facility]     after 2 AM please page floor coverage PA If 7AM-7PM, please contact the day team taking care of the patient using [email or web address]   Patient was evaluated in the context of the global COVID-19 pandemic, which necessitated consideration that the patient might be at risk for infection with the SARS-CoV-2 virus that causes COVID-19. Institutional protocols and algorithms that pertain to the evaluation of patients at risk for COVID-19 are in a state of rapid change based on information released by regulatory bodies including the CDC and federal and state organizations. These policies and  algorithms were followed during the patient's care.

## 2021-12-08 NOTE — ED Notes (Signed)
Patient transported to X-ray 

## 2021-12-08 NOTE — Assessment & Plan Note (Signed)
Hold home medications given persistent hypoglycemia ?

## 2021-12-08 NOTE — Assessment & Plan Note (Signed)
Allow permissive hypertension given sepsis ?

## 2021-12-08 NOTE — Assessment & Plan Note (Signed)
On Lopressor and Coumadin check INR resume home medications when able to tolerate ?

## 2021-12-08 NOTE — Progress Notes (Addendum)
ANTICOAGULATION CONSULT NOTE - Initial Consult ? ?Pharmacy Consult for warfarin ?Indication: atrial fibrillation ? ?Allergies  ?Allergen Reactions  ? Ciprofloxacin Swelling  ?  Site of swelling not recalled  ? Lactose Intolerance (Gi) Nausea And Vomiting and Other (See Comments)  ?  severe stomach pain  ? Penicillins Hives and Itching  ?  "haven't used any since 1970's" - have gotten cephalosporins multiple times ?Has patient had a PCN reaction causing immediate rash, facial/tongue/throat swelling, SOB or lightheadedness with hypotension: Yes ?Has patient had a PCN reaction causing severe rash involving mucus membranes or skin necrosis: Unk ?Has patient had a PCN reaction that required hospitalization: Unk ?Has patient had a PCN reaction occurring within the last 10 years: No ?If all of the above answers are "NO", then may proceed with C  ? Sulfa Antibiotics Hives and Itching  ?  "haven't used any since 1970's"  ? Sulfasalazine Hives and Itching  ?  "haven't used any since 1970's"  ? Lisinopril Other (See Comments)  ?  Other reaction(s): associated with hyperkalemia  ? ? ?Patient Measurements: ?  ? ?Vital Signs: ?Temp: 97.7 ?F (36.5 ?C) (03/06 1424) ?Temp Source: Axillary (03/06 1424) ?BP: 136/90 (03/06 2030) ?Pulse Rate: 136 (03/06 2030) ? ?Labs: ?Recent Labs  ?  12/03/2021 ?1622 12/07/2021 ?1638  ?HGB 11.6* 11.9*  ?HCT 38.0 35.0*  ?PLT PLATELET CLUMPS NOTED ON SMEAR, COUNT APPEARS ADEQUATE  --   ?APTT 22*  --   ?LABPROT 16.9*  --   ?INR 1.4*  --   ?CREATININE 0.82  --   ? ? ?CrCl cannot be calculated (Unknown ideal weight.). ? ? ?Medical History: ?Past Medical History:  ?Diagnosis Date  ? Atrial fibrillation ([place])   ? RVR hx.  Chronic Coumadin  ? Bacteremia 10/2018  ? C. difficile colitis 09/21/2013, 11/2013  ? Carotid stenosis   ? a. Carotid US (10/2013):  bilat 1-39%; f/u 1 year  ? Chronic lower back pain   ? COPD (chronic obstructive pulmonary disease) ([place])   ? Depression   ? Diverticulosis of colon without  hemorrhage   ? Educated about COVID-19 virus infection 09/11/2019  ? Gait abnormality 04/11/2020  ? GERD (gastroesophageal reflux disease)   ? with HH  ? Glaucoma   ? HCAP (healthcare-associated pneumonia) 11/21/2011  ? High cholesterol   ? Hypertension   ? Influenza A 11/28/2018  ? Insomnia   ? Memory disorder 04/11/2020  ? Metabolic encephalopathy 06/07/2670  ? Mitral regurgitation 03/11/2012  ? Pain due to onychomycosis of toenails of both feet 01/29/2021  ? Pain in right foot 10/11/2018  ? Pancreatitis   ? ~2008, 11/2011 post ERCP  ? Pneumonia 12/2011  ? "first time I know about"  ? PVD (peripheral vascular disease) ([place])   ? Rupture of right rotator cuff 01/30/2020  ? Sepsis ([place]) 11/27/2013  ? SIRS (systemic inflammatory response syndrome) ([place]) 03/13/2012  ? a/w post ERCP  pancreatitis.   ? Syncope   ? T12 compression fracture, sequela 05/05/2021  ? Type II diabetes mellitus ([place])   ? Unsteady gait   ? ? ?Medications:  ?(Not in a hospital admission)  ?Scheduled:  ? dextrose  12.5 g Intravenous Once  ? ipratropium-albuterol  3 mL Nebulization QID  ? ?Infusions:  ? [START ON 12/09/2021] azithromycin    ? [START ON 12/09/2021] cefTRIAXone (ROCEPHIN)  IV    ? dextrose    ? ?PRN: acetaminophen **OR** acetaminophen, albuterol, HYDROcodone-acetaminophen ? ?Assessment: ?37 yof with a history of DM2, CHF  systolic diastolic chf, HDL, HTN, dementia. A.fib on coumadin. Patient is presenting with AMS. Warfarin per pharmacy consult placed for AF. ? ?Home warfarin dose is 2.5 mg on TuTh and 1.'25mg'$  all other days. Last dose of warfarin prior to arrival was 3/5. History provided by patient's spouse. ? ?PT/INR today is 16.9/1.4. ?Hgb 11.9; plt clumping noted ? ?Goal of Therapy:  ?INR 2-3 ?Monitor platelets by anticoagulation protocol: Yes ?  ?Plan:  ?Will give '3mg'$  wawrfarin tonight - subsequent dosing per INR ?Monitor CBC and INR daily ?Monitory for s/s of bleeding ? ?Lorelei Pont, PharmD, BCPS ?12/22/2021 10:27 PM ?ED Clinical Pharmacist -   [phone] ? ? ? ?

## 2021-12-08 NOTE — Assessment & Plan Note (Signed)
- -  Patient presenting with   Hypoxia and infiltrate  resuscitation). ? ? will admit for treatment of CAP will start on appropriate antibiotic coverage. - Rocephin/azithromycin ?  Obtain:  sputum cultures, ?               Obtain respiratory panel  ?                COVID ?                  Pending but unlikely ?                 blood cultures and sputum cultures ordered  ?                 strep pneumo UA antigen,  ?                  check for Legionella antigen. ?               Provide oxygen as needed.  ? ? ?

## 2021-12-08 NOTE — Assessment & Plan Note (Signed)
Order urine electrolytes gently rehydrate check TSH ?

## 2021-12-08 NOTE — Assessment & Plan Note (Signed)
Restart her Crestor when able to take p.o. ?

## 2021-12-08 NOTE — Assessment & Plan Note (Signed)
Chronic stable resume home medications albuterol as needed Symbicort ?

## 2021-12-08 NOTE — ED Provider Notes (Signed)
[facility] DEPARTMENT Provider Note   CSN: [id number] Arrival date & time: 12/14/2021  1416     History  Chief Complaint  Patient presents with   Altered Mental Status    [name] [name] is a 88 y.o. female.  HPI Patient presents with her husband who provides much of the history as the patient is initially essentially unresponsive.  Patient after receiving glucagon is able to provide details somewhat.  Level 5 caveat secondary to acute of condition.  Husband notes the patient has dementia, insulin dependent diabetes, and has recently had episodes with hyperglycemia, and episodes of confusion.  Today, the patient had more unsteadiness than usual more, more confusion than usual, and was found to be hypoglycemic at home.  She sustained a fall, while using a walker prior to ED arrival.  Upon awakening the patient denies pain anywhere, but seemingly she complained of pain in right hip previously.    Home Medications Prior to Admission medications   Medication Sig Start Date End Date Taking? Authorizing Provider  acetaminophen (TYLENOL) 325 MG tablet Take 650 mg by mouth every 6 (six) hours as needed for mild pain.     [provider]  albuterol (PROVENTIL) (2.5 MG/3ML) 0.083% nebulizer solution take 1 treatment up to 3 times daily as needed for difficulty breathing 07/01/18   [provider]  BD PEN NEEDLE NANO U/F 32G X 4 MM MISC  05/13/20   [provider]  diltiazem (DILT-XR) 120 MG 24 hr capsule TAKE 1 CAPSULE(120 MG) BY MOUTH DAILY Patient taking differently: Take 120 mg by mouth daily. 01/04/18   Minus Breeding, MD  doxepin (SINEQUAN) 10 MG capsule Take 10 mg by mouth at bedtime. 11/02/19   [provider]  furosemide (LASIX) 40 MG tablet Take 0.5 tablets (20 mg total) by mouth daily. Take one tablet (40 mg) by mouth once a day 05/09/21   Domenic Polite, MD  gabapentin (NEURONTIN) 100 MG capsule Take 100 mg by mouth daily.  04/08/20   [provider]  glimepiride (AMARYL) 4 MG tablet Take 4 mg by mouth daily with breakfast. 11/01/19   [provider]  insulin glargine, 1 Unit Dial, (TOUJEO) 300 UNIT/ML Solostar Pen Inject 8 Units into the skin every morning. 05/09/21   Domenic Polite, MD  latanoprost (XALATAN) 0.005 % ophthalmic solution Place 1 drop into both eyes at bedtime.    [provider]  lidocaine (LIDODERM) 5 % Place 1 patch onto the skin daily. Remove & Discard patch within 12 hours or as directed by MD 05/09/21   Domenic Polite, MD  loperamide (LOPERAMIDE A-D) 2 MG tablet Take 1 tablet (2 mg total) by mouth as needed for diarrhea or loose stools (use at bedtime). Patient taking differently: Take 1-2 mg by mouth at bedtime as needed for diarrhea or loose stools. 11/27/14   Gatha Mayer, MD  metoprolol tartrate (LOPRESSOR) 25 MG tablet Take 12.5 mg by mouth 2 (two) times daily.    [provider]  Multiple Vitamin (MULTIVITAMIN WITH MINERALS) TABS Take 1 tablet by mouth daily.    [provider]  mupirocin ointment (BACTROBAN) 2 % Apply 1 application topically 2 (two) times daily. 04/17/21   [provider]  ondansetron (ZOFRAN) 4 MG tablet Take 4 mg by mouth every 8 (eight) hours as needed for nausea or vomiting. 05/06/20   [provider]  oxybutynin (DITROPAN) 5 MG tablet Take 5 mg by mouth at bedtime.  07/29/16  [provider]  pantoprazole (PROTONIX) 40 MG tablet Take 1 tablet (40 mg total) by mouth 2 (two) times daily. 06/07/18 11/27/27  Arrien, Jimmy Picket, MD  pilocarpine (PILOCAR) 1 % ophthalmic solution Place 1 drop into both eyes 2 (two) times daily. 08/28/20   [provider]  Probiotic Product (PROBIOTIC PO) Take 1 capsule by mouth daily.    [provider]  rOPINIRole (REQUIP) 0.5 MG tablet Take 0.5 mg by mouth at bedtime.    [provider]  rosuvastatin (CRESTOR) 10 MG tablet Take 10 mg by mouth  daily. 11/01/19   [provider]  SYMBICORT 160-4.5 MCG/ACT inhaler Inhale 2 puffs into the lungs 2 (two) times daily. 03/08/14   [provider]  timolol (TIMOPTIC) 0.5 % ophthalmic solution Place 1 drop into both eyes 2 (two) times daily. 09/21/19   [provider]  warfarin (COUMADIN) 2.5 MG tablet Take 1.25-2.5 mg by mouth daily. Take 2.5 mg on Mon,Wed,Fri. Take 1.25 mg all other days 04/08/20   [provider]      Allergies    Ciprofloxacin, Lactose intolerance (gi), Penicillins, Sulfa antibiotics, Sulfasalazine, and Lisinopril    Review of Systems   Review of Systems  Unable to perform ROS: Acuity of condition   Physical Exam Updated Vital Signs BP (!) 170/91    Pulse (!) 119    Temp 97.7 F (36.5 C) (Axillary)    Resp 20    SpO2 90%  Physical Exam Vitals and nursing note reviewed.  Constitutional:      General: She is not in acute distress.    Appearance: She is well-developed.  HENT:     Head: Normocephalic and atraumatic.  Eyes:     Conjunctiva/sclera: Conjunctivae normal.  Cardiovascular:     Rate and Rhythm: Regular rhythm. Tachycardia present.  Pulmonary:     Breath sounds: Wheezing present.  Abdominal:     General: There is no distension.  Musculoskeletal:     Cervical back: Normal range of motion and neck supple. No rigidity or tenderness.     Comments: Pelvis stable, patient elevates each hip independently, to command, without reported discomfort.  Skin:    General: Skin is warm and dry.  Neurological:     Mental Status: She is alert.     Cranial Nerves: No cranial nerve deficit.     Comments: Patient initially listless, improved to oriented to self, husband, globally to place, time, moves all extremities spontaneously.    ED Results / Procedures / Treatments   Labs (all labs ordered are listed, but only abnormal results are displayed) Labs Reviewed  APTT - Abnormal; Notable for the following components:      Result Value    aPTT 22 (*)    All other components within normal limits  PROTIME-INR - Abnormal; Notable for the following components:   Prothrombin Time 16.9 (*)    INR 1.4 (*)    All other components within normal limits  CBG MONITORING, ED - Abnormal; Notable for the following components:   Glucose-Capillary 26 (*)    All other components within normal limits  I-STAT VENOUS BLOOD GAS, ED - Abnormal; Notable for the following components:   pH, Ven 7.458 (*)    pCO2, Ven 37.3 (*)    pO2, Ven 60 (*)    Acid-Base Excess 3.0 (*)    Sodium 128 (*)    Calcium, Ion 0.93 (*)    HCT 35.0 (*)    Hemoglobin 11.9 (*)  All other components within normal limits  CBG MONITORING, ED - Abnormal; Notable for the following components:   Glucose-Capillary 155 (*)    All other components within normal limits  URINE CULTURE  CULTURE, BLOOD (ROUTINE X 2)  CULTURE, BLOOD (ROUTINE X 2)  RESP PANEL BY RT-PCR (FLU A&B, COVID) ARPGX2  COMPREHENSIVE METABOLIC PANEL  CBC WITH DIFFERENTIAL/PLATELET  URINALYSIS, ROUTINE W REFLEX MICROSCOPIC  AMMONIA  LACTIC ACID, PLASMA  ETHANOL  RAPID URINE DRUG SCREEN, HOSP PERFORMED    EKG None  Radiology DG Chest 1 View  Result Date: 12/07/2021 CLINICAL DATA:  Altered mental status. EXAM: CHEST  1 VIEW COMPARISON:  Prior chest radiographs 11/25/2018. FINDINGS: Heart size at the upper limits of normal, unchanged. Aortic atherosclerosis. Mild bilateral interstitial prominence suggesting interstitial edema. No definite airspace consolidation. No evidence of pleural effusion or pneumothorax. No acute bony abnormality identified. IMPRESSION: Prominence of the bilateral interstitial lung markings, suggesting interstitial edema. Atypical/viral pneumonia can also have this appearance and clinical correlation is recommended. Aortic Atherosclerosis (ICD10-I70.0). Redemonstrated prominent mitral annular calcifications. Electronically Signed   By: Kellie Simmering D.O.   On: 12/25/2021 15:12    CT HEAD WO CONTRAST  Result Date: 12/04/2021 CLINICAL DATA:  Mental status change, unknown cause. Additional history provided: Generalized weakness, altered mental status, fall. EXAM: CT HEAD WITHOUT CONTRAST TECHNIQUE: Contiguous axial images were obtained from the base of the skull through the vertex without intravenous contrast. RADIATION DOSE REDUCTION: This exam was performed according to the departmental dose-optimization program which includes automated exposure control, adjustment of the mA and/or kV according to patient size and/or use of iterative reconstruction technique. COMPARISON:  Prior head CT examinations 11/26/2018 and earlier. Brain MRI 05/16/2018. FINDINGS: Brain: Moderate generalized cerebral atrophy. Comparatively mild cerebellar atrophy. Mild patchy and ill-defined hypoattenuation within the cerebral white matter, nonspecific but compatible with chronic small vessel ischemic disease. There is no acute intracranial hemorrhage. No demarcated cortical infarct. No extra-axial fluid collection. No evidence of an intracranial mass. No midline shift. Vascular: No hyperdense vessel.  Atherosclerotic calcifications. Skull: Normal. Negative for fracture or focal lesion. Sinuses/Orbits: Visualized orbits show no acute finding. Mild mucosal thickening within the bilateral frontoethmoidal recesses. Mild mucosal thickening and fluid within the bilateral ethmoid sinuses. Mild mucosal thickening within the bilateral sphenoid sinuses. Trace mucosal thickening within the left maxillary sinus at the imaged levels. Other: Subtle frontoparietal scalp soft tissue swelling (series 5, image 30). IMPRESSION: No evidence of acute intracranial abnormality. Subtle frontoparietal scalp soft tissue swelling slightly eccentric to the right. Mild chronic small vessel image changes within the cerebral white matter, stable from the prior head CT of 11/26/2018. Moderate generalized cerebral atrophy. Comparatively mild  cerebellar atrophy. Paranasal sinus disease at the imaged levels, as described. Electronically Signed   By: Kellie Simmering D.O.   On: 12/09/2021 15:25   DG Hip Unilat W or Wo Pelvis 2-3 Views Right  Result Date: 12/07/2021 CLINICAL DATA:  Fall EXAM: DG HIP (WITH OR WITHOUT PELVIS) 2-3V RIGHT COMPARISON:  None. FINDINGS: Status post left hip hemiarthroplasty with no evidence of hardware fracture or loosening. No evidence of left hip dislocation on this single frontal view. No pelvic fracture or diastasis. No right hip fracture or dislocation. Diffuse osteopenia. No aggressive appearing focal osseous lesions. Mild right hip osteoarthritis. Degenerative changes in the visualized lower lumbar spine. IMPRESSION: No osseous fracture. No right hip dislocation. Status post left hip hemiarthroplasty without evidence of hardware complication. Diffuse osteopenia. Electronically Signed   By: Rinaldo Ratel  Poff M.D.   On: 12/06/2021 17:10     Medications Ordered in ED Medications  albuterol (PROVENTIL) (2.5 MG/3ML) 0.083% nebulizer solution 5 mg (has no administration in time range)  dextrose 50 % solution 50 mL ( Intravenous Given 12/18/2021 1609)    ED Course/ Medical Decision Making/ A&P  This patient presents to the ED for concern of altered mental status, hypoglycemia, this involves an extensive number of treatment options, and is a complaint that carries with it a high risk of complications and morbidity.  The differential diagnosis includes insulin-dependent diabetes related complication, stroke, infection, electrolyte abnormalities   Co morbidities that complicate the patient evaluation  Obesity, dementia, diabetes   Social Determinants of Health:  Age, dementia   Additional history obtained:  Additional history and/or information obtained from husband External records from outside source obtained and reviewed including as above recent medication adjustments suggested for better control of her  hyperglycemia, as well as details from today's fall.   After the initial evaluation, orders, including: Dextrose, labs, x-ray, CT were initiated.  Patient placed on Cardiac and Pulse-Oximetry Monitors. The patient was maintained on a cardiac monitor.  The cardiac monitored showed an rhythm of 115 sinus tach abnormal The patient was also maintained on pulse oximetry. The readings were typically 97% with 2 L via nasal cannula abnormal   8:27 PM After the patient received a glucose initially, mentation improved substantially, but she continued to have oxygen dependency.  Review of the x-ray suggest fluid overload versus pneumonia picture, patient received antibiotics.  Soon thereafter the patient developed fever, received Tylenol.                         On repeat evaluation of the patient improved  Lab Tests:  I personally interpreted labs.  The pertinent results include: Leukocytosis, lactic acidosis, elevated BNP, hypoglycemia, and evidence for urinary tract infection  Imaging Studies ordered:  I independently visualized and interpreted imaging which showed fluid overload status, concern for pneumonia I agree with the radiologist interpretation  Consultations Obtained:  I requested consultation with the internal medicine,  and discussed lab and imaging findings as well as pertinent plan - they recommend: Admission  Dispostion / Final MDM:  After consideration of the diagnostic results and the patient's response to treatment, will require stepdown admission given concern for SIRS hypoglycemia and altered mental status.  Female with dementia presents with increasing weakness over the past few days, is found to be hypoglycemic initially, with new oxygen requirement.  Soon thereafter patient developed fever, and with concern for pneumonia, patient received antibiotics.  Patient's blood pressure was appropriate, she did not receive fluids per sepsis protocol given concern for heart failure  exacerbation with BNP 4 times greater than her most recent value.  Patient required admission for ongoing monitoring, management, with concern for SIRS, hypoglycemia, possible pneumonia, and urinary tract infection with altered mental status.  Final Clinical Impression(s) / ED Diagnoses Final diagnoses:  Transient alteration of awareness  SIRS (systemic inflammatory response syndrome) (HCC)  Hypoglycemia  Lower urinary tract infectious disease  CRITICAL CARE Performed by: Carmin Muskrat Total critical care time: 35 minutes Critical care time was exclusive of separately billable procedures and treating other patients. Critical care was necessary to treat or prevent imminent or life-threatening deterioration. Critical care was time spent personally by me on the following activities: development of treatment plan with patient and/or surrogate as well as nursing, discussions with consultants, evaluation of patient's  response to treatment, examination of patient, obtaining history from patient or surrogate, ordering and performing treatments and interventions, ordering and review of laboratory studies, ordering and review of radiographic studies, pulse oximetry and re-evaluation of patient's condition.    Carmin Muskrat, MD 12/18/2021 2030

## 2021-12-08 NOTE — ED Triage Notes (Addendum)
Patient BIB [facility] from home where she lives with husband for evaluation of generalized weakness, altered mental status, and a fall. On Friday EMS reports husband stated the patient was weaker than normal, then Sunday she became confused, then fell today. Patient is on coumadin, husband told EMS the patient did not hit her head, patient has no obvious head injury.  Patient writhing in recliner, responds "What?" To her name but does not answer questions, does not follow commands. Wears 4L O2 [place] at baseline. Pupils are 38m bilaterally. No obvious facial droop, patient moving all extremities. ?

## 2021-12-08 NOTE — ED Provider Triage Note (Signed)
Emergency Medicine Provider Triage Evaluation Note ? ?[name] [name] , a 88 y.o. female  was evaluated in triage.  Patient presents emergency department with chief plaint of altered mental status.  Apparently she lives at home where she lives with her husband and is fairly independent.  On Friday, husband reports the patient was weaker than normal.  He states on Sunday she became confused.  This is gradually worsened.  Today she fell today.  Apparently she did not hit her head.  He has not noticed any other changes.  She is on warfarin at home. ? ?Here in triage she is wearing 4 L nasal cannula O2 which is apparently her baseline.  Pupils are 3 mm bilaterally and reactive.  No obvious facial droop.  She is moving all extremities but does seem to be moving her left upper extremity little bit less.  There is no obvious facial droop.  Patient has not oriented and is not following commands.  She is groaning in triage.  ? ?Review of Systems  ?Positive: AMS ?Negative:  ? ?Physical Exam  ?BP (!) 159/87 (BP Location: Left Arm)   Pulse 98   Temp 97.7 ?F (36.5 ?C) (Axillary)   Resp 20   SpO2 94%  ?Gen:   Awake, no distress   ?Resp:  Normal effort  ?MSK:   Moves extremities without difficulty  ?Other:  Lungs clear. ?Patient disoriented. Not following commands. Moving all ext spontaneously, but LUE seems to be moving slightly less. PERRLA. Patient groaning in triage. No obvious injuries or deformities. No obvious areas of bruising.  ? ?Medical Decision Making  ?Medically screening exam initiated at 2:36 PM.  Appropriate orders placed.  Jaclyn Murray-Rush was informed that the remainder of the evaluation will be completed by another provider, this initial triage assessment does not replace that evaluation, and the importance of remaining in the ED until their evaluation is complete. ? ?  ?Adolphus Birchwood, PA-C ?12/26/2021 1440 ? ?

## 2021-12-08 NOTE — ED Notes (Signed)
Unable to get labs, pt moving unable to draw blood ?

## 2021-12-08 NOTE — Assessment & Plan Note (Addendum)
On 3L of O2 at this time ?continue oxygen and titrate as need ? this patient has acute respiratory failure with Hypoxia  as documented by the presence of following: ?O2 saturatio< 90% on RA Likely due to:  Pneumonia, CHF exacerbation  ? Continuous pulse ox ? check Pulse ox with ambulation prior to discharge ?  may need  TC consult for home O2 set up ?  ? ? ?

## 2021-12-09 ENCOUNTER — Inpatient Hospital Stay (HOSPITAL_COMMUNITY): Payer: Medicare HMO

## 2021-12-09 DIAGNOSIS — R609 Edema, unspecified: Secondary | ICD-10-CM | POA: Diagnosis not present

## 2021-12-09 DIAGNOSIS — I4891 Unspecified atrial fibrillation: Secondary | ICD-10-CM

## 2021-12-09 DIAGNOSIS — R6520 Severe sepsis without septic shock: Secondary | ICD-10-CM

## 2021-12-09 DIAGNOSIS — J9601 Acute respiratory failure with hypoxia: Secondary | ICD-10-CM | POA: Diagnosis not present

## 2021-12-09 DIAGNOSIS — G9340 Encephalopathy, unspecified: Secondary | ICD-10-CM | POA: Diagnosis not present

## 2021-12-09 DIAGNOSIS — A419 Sepsis, unspecified organism: Secondary | ICD-10-CM | POA: Diagnosis not present

## 2021-12-09 DIAGNOSIS — R0609 Other forms of dyspnea: Secondary | ICD-10-CM

## 2021-12-09 DIAGNOSIS — R778 Other specified abnormalities of plasma proteins: Secondary | ICD-10-CM | POA: Diagnosis present

## 2021-12-09 DIAGNOSIS — J811 Chronic pulmonary edema: Secondary | ICD-10-CM | POA: Diagnosis present

## 2021-12-09 LAB — BASIC METABOLIC PANEL
Anion gap: 14 (ref 5–15)
BUN: 11 mg/dL (ref 8–23)
CO2: 19 mmol/L — ABNORMAL LOW (ref 22–32)
Calcium: 7.8 mg/dL — ABNORMAL LOW (ref 8.9–10.3)
Chloride: 95 mmol/L — ABNORMAL LOW (ref 98–111)
Creatinine, Ser: 0.78 mg/dL (ref 0.44–1.00)
GFR, Estimated: >60 mL/min (ref >60)
Glucose, Bld: 76 mg/dL (ref 70–99)
Potassium: 4.7 mmol/L (ref 3.5–5.1)
Sodium: 128 mmol/L — ABNORMAL LOW (ref 135–145)

## 2021-12-09 LAB — CBG MONITORING, ED
Glucose-Capillary: 100 mg/dL — ABNORMAL HIGH (ref 70–99)
Glucose-Capillary: 124 mg/dL — ABNORMAL HIGH (ref 70–99)
Glucose-Capillary: 133 mg/dL — ABNORMAL HIGH (ref 70–99)
Glucose-Capillary: 149 mg/dL — ABNORMAL HIGH (ref 70–99)
Glucose-Capillary: 152 mg/dL — ABNORMAL HIGH (ref 70–99)
Glucose-Capillary: 162 mg/dL — ABNORMAL HIGH (ref 70–99)
Glucose-Capillary: 52 mg/dL — ABNORMAL LOW (ref 70–99)
Glucose-Capillary: 53 mg/dL — ABNORMAL LOW (ref 70–99)
Glucose-Capillary: 98 mg/dL (ref 70–99)

## 2021-12-09 LAB — BLOOD CULTURE ID PANEL (REFLEXED) - BCID2

## 2021-12-09 LAB — RESPIRATORY PANEL BY PCR

## 2021-12-09 LAB — HEPATIC FUNCTION PANEL
ALT: 15 U/L (ref 0–44)
AST: 41 U/L (ref 15–41)
Albumin: 2.6 g/dL — ABNORMAL LOW (ref 3.5–5.0)
Alkaline Phosphatase: 46 U/L (ref 38–126)
Bilirubin, Direct: 0.1 mg/dL (ref 0.0–0.2)
Indirect Bilirubin: 0.4 mg/dL (ref 0.3–0.9)
Total Bilirubin: 0.5 mg/dL (ref 0.3–1.2)
Total Protein: 5.6 g/dL — ABNORMAL LOW (ref 6.5–8.1)

## 2021-12-09 LAB — ECHOCARDIOGRAM COMPLETE
AR max vel: 2.67 cm2
AV Area VTI: 2.91 cm2
AV Area mean vel: 2.83 cm2
AV Mean grad: 3 mmHg
AV Peak grad: 5.8 mmHg
Ao pk vel: 1.2 m/s
Area-P 1/2: 3.27 cm2
Height: 60 in
MV VTI: 1.93 cm2
S' Lateral: 2.3 cm
Weight: 2144 oz

## 2021-12-09 LAB — I-STAT ARTERIAL BLOOD GAS, ED
Acid-Base Excess: 2 mmol/L (ref 0.0–2.0)
Bicarbonate: 25 mmol/L (ref 20.0–28.0)
Calcium, Ion: 1.07 mmol/L — ABNORMAL LOW (ref 1.15–1.40)
HCT: 30 % — ABNORMAL LOW (ref 36.0–46.0)
Hemoglobin: 10.2 g/dL — ABNORMAL LOW (ref 12.0–15.0)
O2 Saturation: 96 %
Patient temperature: 102.2
Potassium: 3.3 mmol/L — ABNORMAL LOW (ref 3.5–5.1)
Sodium: 128 mmol/L — ABNORMAL LOW (ref 135–145)
TCO2: 26 mmol/L (ref 22–32)
pCO2 arterial: 37 mmHg (ref 32–48)
pH, Arterial: 7.444 (ref 7.35–7.45)
pO2, Arterial: 87 mmHg (ref 83–108)

## 2021-12-09 LAB — COMPREHENSIVE METABOLIC PANEL
ALT: 16 U/L (ref 0–44)
AST: 51 U/L — ABNORMAL HIGH (ref 15–41)
Albumin: 3 g/dL — ABNORMAL LOW (ref 3.5–5.0)
Alkaline Phosphatase: 46 U/L (ref 38–126)
Anion gap: 14 (ref 5–15)
BUN: 11 mg/dL (ref 8–23)
CO2: 22 mmol/L (ref 22–32)
Calcium: 7.8 mg/dL — ABNORMAL LOW (ref 8.9–10.3)
Chloride: 94 mmol/L — ABNORMAL LOW (ref 98–111)
Creatinine, Ser: 0.86 mg/dL (ref 0.44–1.00)
GFR, Estimated: >60 mL/min (ref >60)
Glucose, Bld: 143 mg/dL — ABNORMAL HIGH (ref 70–99)
Potassium: 2.7 mmol/L — CL (ref 3.5–5.1)
Sodium: 130 mmol/L — ABNORMAL LOW (ref 135–145)
Total Bilirubin: 0.5 mg/dL (ref 0.3–1.2)
Total Protein: 6 g/dL — ABNORMAL LOW (ref 6.5–8.1)

## 2021-12-09 LAB — PROTIME-INR
INR: 1.5 — ABNORMAL HIGH (ref 0.8–1.2)
Prothrombin Time: 17.8 seconds — ABNORMAL HIGH (ref 11.4–15.2)

## 2021-12-09 LAB — PROCALCITONIN: Procalcitonin: <0.1 ng/mL

## 2021-12-09 LAB — CBC WITH DIFFERENTIAL/PLATELET
Abs Immature Granulocytes: 0.08 10*3/uL — ABNORMAL HIGH (ref 0.00–0.07)
Basophils Absolute: 0 10*3/uL (ref 0.0–0.1)
Basophils Relative: 0 %
Eosinophils Absolute: 0.3 10*3/uL (ref 0.0–0.5)
Eosinophils Relative: 3 %
HCT: 34.8 % — ABNORMAL LOW (ref 36.0–46.0)
Hemoglobin: 11.2 g/dL — ABNORMAL LOW (ref 12.0–15.0)
Immature Granulocytes: 1 %
Lymphocytes Relative: 15 %
Lymphs Abs: 1.3 10*3/uL (ref 0.7–4.0)
MCH: 27.5 pg (ref 26.0–34.0)
MCHC: 32.2 g/dL (ref 30.0–36.0)
MCV: 85.5 fL (ref 80.0–100.0)
Monocytes Absolute: 0.5 10*3/uL (ref 0.1–1.0)
Monocytes Relative: 6 %
Neutro Abs: 6.4 10*3/uL (ref 1.7–7.7)
Neutrophils Relative %: 75 %
Platelets: 145 10*3/uL — ABNORMAL LOW (ref 150–400)
RBC: 4.07 MIL/uL (ref 3.87–5.11)
RDW: 15.7 % — ABNORMAL HIGH (ref 11.5–15.5)
WBC: 8.5 10*3/uL (ref 4.0–10.5)
nRBC: 0 % (ref 0.0–0.2)

## 2021-12-09 LAB — IRON AND TIBC
Iron: 31 ug/dL (ref 28–170)
Saturation Ratios: 10 % — ABNORMAL LOW (ref 10.4–31.8)
TIBC: 309 ug/dL (ref 250–450)
UIBC: 278 ug/dL

## 2021-12-09 LAB — GLUCOSE, CAPILLARY
Glucose-Capillary: 99 mg/dL (ref 70–99)
Glucose-Capillary: 79 mg/dL (ref 70–99)

## 2021-12-09 LAB — CORTISOL-AM, BLOOD: Cortisol - AM: 23.2 ug/dL — ABNORMAL HIGH (ref 6.7–22.6)

## 2021-12-09 LAB — FOLATE: Folate: 38.5 ng/mL (ref >5.9)

## 2021-12-09 LAB — STREP PNEUMONIAE URINARY ANTIGEN: Strep Pneumo Urinary Antigen: NEGATIVE

## 2021-12-09 LAB — PHOSPHORUS
Phosphorus: 2.3 mg/dL — ABNORMAL LOW (ref 2.5–4.6)
Phosphorus: 2.4 mg/dL — ABNORMAL LOW (ref 2.5–4.6)
Phosphorus: 2.3 mg/dL — ABNORMAL LOW (ref 2.5–4.6)

## 2021-12-09 LAB — FERRITIN: Ferritin: 61 ng/mL (ref 11–307)

## 2021-12-09 LAB — CORTISOL: Cortisol, Plasma: 14.2 ug/dL

## 2021-12-09 LAB — TROPONIN I (HIGH SENSITIVITY)
Troponin I (High Sensitivity): 73 ng/L — ABNORMAL HIGH (ref <18)
Troponin I (High Sensitivity): 50 ng/L — ABNORMAL HIGH (ref <18)
Troponin I (High Sensitivity): 59 ng/L — ABNORMAL HIGH (ref <18)
Troponin I (High Sensitivity): 49 ng/L — ABNORMAL HIGH (ref <18)

## 2021-12-09 LAB — MAGNESIUM
Magnesium: 1.5 mg/dL — ABNORMAL LOW (ref 1.7–2.4)
Magnesium: 1.5 mg/dL — ABNORMAL LOW (ref 1.7–2.4)
Magnesium: 1.9 mg/dL (ref 1.7–2.4)

## 2021-12-09 LAB — TSH
TSH: 2.052 u[IU]/mL (ref 0.350–4.500)
TSH: 2.705 u[IU]/mL (ref 0.350–4.500)

## 2021-12-09 LAB — MRSA NEXT GEN BY PCR, NASAL: MRSA by PCR Next Gen: NOT DETECTED

## 2021-12-09 LAB — LACTIC ACID, PLASMA: Lactic Acid, Venous: 1.5 mmol/L (ref 0.5–1.9)

## 2021-12-09 LAB — CK: Total CK: 273 U/L — ABNORMAL HIGH (ref 38–234)

## 2021-12-09 LAB — OSMOLALITY: Osmolality: 276 mOsm/kg (ref 275–295)

## 2021-12-09 LAB — VITAMIN B12: Vitamin B-12: 305 pg/mL (ref 180–914)

## 2021-12-09 MED ORDER — POTASSIUM CHLORIDE 10 MEQ/100ML IV SOLN
10.0000 meq | INTRAVENOUS | Status: AC
  Administered 2021-12-09 (×6): 10 meq via INTRAVENOUS
  Filled 2021-12-09 (×6): qty 100

## 2021-12-09 MED ORDER — AMIODARONE LOAD VIA INFUSION
150.0000 mg | Freq: Once | INTRAVENOUS | Status: AC
  Administered 2021-12-09: 150 mg via INTRAVENOUS
  Filled 2021-12-09: qty 83.34

## 2021-12-09 MED ORDER — AMIODARONE HCL IN DEXTROSE 360-4.14 MG/200ML-% IV SOLN
30.0000 mg/h | INTRAVENOUS | Status: DC
  Administered 2021-12-09 – 2021-12-12 (×8): 30 mg/h via INTRAVENOUS
  Filled 2021-12-09 (×9): qty 200

## 2021-12-09 MED ORDER — DILTIAZEM HCL-DEXTROSE 125-5 MG/125ML-% IV SOLN (PREMIX)
5.0000 mg/h | INTRAVENOUS | Status: DC
  Administered 2021-12-09: 5 mg/h via INTRAVENOUS
  Filled 2021-12-09: qty 125

## 2021-12-09 MED ORDER — CYANOCOBALAMIN 1000 MCG/ML IJ SOLN
1000.0000 ug | Freq: Every day | INTRAMUSCULAR | Status: DC
  Administered 2021-12-10: 1000 ug via INTRAMUSCULAR
  Filled 2021-12-09: qty 1

## 2021-12-09 MED ORDER — MAGNESIUM SULFATE IN D5W 1-5 GM/100ML-% IV SOLN
1.0000 g | Freq: Once | INTRAVENOUS | Status: AC
  Administered 2021-12-09: 1 g via INTRAVENOUS
  Filled 2021-12-09 (×2): qty 100

## 2021-12-09 MED ORDER — SODIUM CHLORIDE 0.9 % IV SOLN
2.0000 g | Freq: Two times a day (BID) | INTRAVENOUS | Status: DC
  Administered 2021-12-09 – 2021-12-12 (×7): 2 g via INTRAVENOUS
  Filled 2021-12-09 (×7): qty 2

## 2021-12-09 MED ORDER — WARFARIN SODIUM 3 MG PO TABS
3.0000 mg | ORAL_TABLET | Freq: Once | ORAL | Status: AC
  Filled 2021-12-09: qty 1

## 2021-12-09 MED ORDER — VANCOMYCIN HCL 1250 MG/250ML IV SOLN: 1250.0000 mg | INTRAVENOUS | Status: DC

## 2021-12-09 MED ORDER — SODIUM PHOSPHATES 45 MMOLE/15ML IV SOLN
30.0000 mmol | Freq: Once | INTRAVENOUS | Status: AC
  Administered 2021-12-10: 30 mmol via INTRAVENOUS
  Filled 2021-12-09: qty 10

## 2021-12-09 MED ORDER — POTASSIUM PHOSPHATES 15 MMOLE/5ML IV SOLN
15.0000 mmol | Freq: Once | INTRAVENOUS | Status: AC
  Administered 2021-12-09: 15 mmol via INTRAVENOUS
  Filled 2021-12-09: qty 5

## 2021-12-09 MED ORDER — AMIODARONE HCL IN DEXTROSE 360-4.14 MG/200ML-% IV SOLN
60.0000 mg/h | INTRAVENOUS | Status: AC
  Administered 2021-12-09: 60 mg/h via INTRAVENOUS
  Filled 2021-12-09 (×2): qty 200

## 2021-12-09 MED ORDER — VANCOMYCIN HCL 1250 MG/250ML IV SOLN
1250.0000 mg | Freq: Once | INTRAVENOUS | Status: AC
  Administered 2021-12-09: 1250 mg via INTRAVENOUS
  Filled 2021-12-09: qty 250

## 2021-12-09 MED ORDER — SODIUM CHLORIDE 0.9 % IV SOLN
2.0000 g | Freq: Once | INTRAVENOUS | Status: AC
  Administered 2021-12-09: 2 g via INTRAVENOUS
  Filled 2021-12-09: qty 2

## 2021-12-09 MED ORDER — DEXTROSE 50 % IV SOLN: 1.0000 | Freq: Once | INTRAVENOUS | Status: DC | PRN

## 2021-12-09 MED ORDER — CHLORHEXIDINE GLUCONATE CLOTH 2 % EX PADS
6.0000 | MEDICATED_PAD | Freq: Every day | CUTANEOUS | Status: DC
  Administered 2021-12-10 – 2021-12-13 (×4): 6 via TOPICAL

## 2021-12-09 MED ORDER — METOPROLOL TARTRATE 5 MG/5ML IV SOLN
2.5000 mg | INTRAVENOUS | Status: DC | PRN
  Administered 2021-12-09 – 2021-12-13 (×2): 2.5 mg via INTRAVENOUS
  Filled 2021-12-09 (×2): qty 5

## 2021-12-09 MED ORDER — ENOXAPARIN SODIUM 60 MG/0.6ML IJ SOSY
60.0000 mg | PREFILLED_SYRINGE | Freq: Two times a day (BID) | INTRAMUSCULAR | Status: DC
  Administered 2021-12-09 – 2021-12-10 (×2): 60 mg via SUBCUTANEOUS
  Filled 2021-12-09 (×4): qty 0.6

## 2021-12-09 MED ORDER — DEXTROSE 50 % IV SOLN
INTRAVENOUS | Status: AC
  Administered 2021-12-09: 25 g via INTRAVENOUS
  Filled 2021-12-09: qty 50

## 2021-12-09 MED ORDER — CYANOCOBALAMIN 1000 MCG/ML IJ SOLN: 1000.0000 ug | INTRAMUSCULAR | Status: DC

## 2021-12-09 MED ORDER — MAGNESIUM SULFATE 2 GM/50ML IV SOLN
2.0000 g | Freq: Once | INTRAVENOUS | Status: AC
  Administered 2021-12-09: 2 g via INTRAVENOUS
  Filled 2021-12-09: qty 50

## 2021-12-09 MED ORDER — DEXTROSE 50 % IV SOLN: 12.5000 g | Freq: Once | INTRAVENOUS | Status: AC

## 2021-12-09 NOTE — Progress Notes (Addendum)
ANTICOAGULATION CONSULT NOTE - Follow Up Consult ? ?Pharmacy Consult for warfarin ?Indication: atrial fibrillation ? ?Allergies  ?Allergen Reactions  ? Ciprofloxacin Swelling  ?  Site of swelling not recalled  ? Lactose Intolerance (Gi) Nausea And Vomiting and Other (See Comments)  ?  severe stomach pain  ? Penicillins Hives and Itching  ?  "haven't used any since 1970's" - have gotten cephalosporins multiple times ?Has patient had a PCN reaction causing immediate rash, facial/tongue/throat swelling, SOB or lightheadedness with hypotension: Yes ?Has patient had a PCN reaction causing severe rash involving mucus membranes or skin necrosis: Unk ?Has patient had a PCN reaction that required hospitalization: Unk ?Has patient had a PCN reaction occurring within the last 10 years: No ?If all of the above answers are "NO", then may proceed with C  ? Sulfa Antibiotics Hives and Itching  ?  "haven't used any since 1970's"  ? Sulfasalazine Hives and Itching  ?  "haven't used any since 1970's"  ? Lisinopril Other (See Comments)  ?  Other reaction(s): associated with hyperkalemia  ? ? ?Patient Measurements: ?  ? ?Vital Signs: ?Temp: 102.5 ?F (39.2 ?C) (03/07 1941) ?Temp Source: Axillary (03/07 7408) ?BP: 119/75 (03/07 0700) ?Pulse Rate: 110 (03/07 0700) ? ?Labs: ?Recent Labs  ?  12/24/2021 ?1622 12/04/2021 ?1638 12/24/2021 ?2259 12/23/2021 ?2259 12/14/2021 ?2300 12/09/21 ?0204 12/09/21 ?0224 12/09/21 ?0424  ?HGB 11.6*   < > 10.5*  --  9.0*  --  11.2*  --   ?HCT 38.0   < > 31.0*  --  28.7*  --  34.8*  --   ?PLT PLATELET CLUMPS NOTED ON SMEAR, COUNT APPEARS ADEQUATE  --   --   --  150  --  145*  --   ?APTT 22*  --   --   --   --   --   --   --   ?LABPROT 16.9*  --   --   --   --   --  17.8*  --   ?INR 1.4*  --   --   --   --   --  1.5*  --   ?CREATININE 0.82  --   --   --   --  0.86  --   --   ?CKTOTAL  --   --   --   --  273*  --   --   --   ?TROPONINIHS  --   --   --    < > 73* 59* 50* 49*  ? < > = values in this interval not displayed.   ? ? ? ?CrCl cannot be calculated (Unknown ideal weight.). ? ? ?Medical History: ?Past Medical History:  ?Diagnosis Date  ? Atrial fibrillation ([place])   ? RVR hx.  Chronic Coumadin  ? Bacteremia 10/2018  ? C. difficile colitis 09/21/2013, 11/2013  ? Carotid stenosis   ? a. Carotid US (10/2013):  bilat 1-39%; f/u 1 year  ? Chronic lower back pain   ? COPD (chronic obstructive pulmonary disease) ([place])   ? Depression   ? Diverticulosis of colon without hemorrhage   ? Educated about COVID-19 virus infection 09/11/2019  ? Gait abnormality 04/11/2020  ? GERD (gastroesophageal reflux disease)   ? with HH  ? Glaucoma   ? HCAP (healthcare-associated pneumonia) 11/21/2011  ? High cholesterol   ? Hypertension   ? Influenza A 11/28/2018  ? Insomnia   ? Memory disorder 04/11/2020  ? Metabolic encephalopathy 10/08/4816  ?  Mitral regurgitation 03/11/2012  ? Pain due to onychomycosis of toenails of both feet 01/29/2021  ? Pain in right foot 10/11/2018  ? Pancreatitis   ? ~2008, 11/2011 post ERCP  ? Pneumonia 12/2011  ? "first time I know about"  ? PVD (peripheral vascular disease) ([place])   ? Rupture of right rotator cuff 01/30/2020  ? Sepsis ([place]) 11/27/2013  ? SIRS (systemic inflammatory response syndrome) ([place]) 03/13/2012  ? a/w post ERCP  pancreatitis.   ? Syncope   ? T12 compression fracture, sequela 05/05/2021  ? Type II diabetes mellitus ([place])   ? Unsteady gait   ? ? ?Medications:  ?(Not in a hospital admission) ?Scheduled:  ? ipratropium-albuterol  3 mL Nebulization QID  ? Warfarin - Pharmacist Dosing Inpatient   Does not apply K9983  ? ?Infusions:  ? azithromycin    ? cefTRIAXone (ROCEPHIN)  IV    ? dextrose 50 mL/hr at 12/09/21 0054  ? diltiazem (CARDIZEM) infusion 12.5 mg/hr (12/09/21 3825)  ? potassium chloride    ? potassium PHOSPHATE IVPB (in mmol) 15 mmol (12/09/21 0518)  ? ?PRN: acetaminophen **OR** acetaminophen, albuterol, HYDROcodone-acetaminophen ? ?Assessment: ?45 yof with a history of DM2, CHF systolic diastolic chf, HDL, HTN, dementia.  A.fib on coumadin. Patient is presenting with AMS. Warfarin per pharmacy consult placed for AF. ? ?Home warfarin dose is 2.5 mg on TuTh and 1.'25mg'$  all other days. Last dose of warfarin prior to arrival was 3/5. History provided by patient's spouse. ? ?Today, INR of 1.5 is subtherapeutic. Hgb 11.2. Plt 145. No bleeding noted. Patient is on ceftriaxone and azithromycin which may increase INR.  ? ?Goal of Therapy:  ?INR 2-3 ?Monitor platelets by anticoagulation protocol: Yes ?  ?Plan:  ?Warfarin '3mg'$  x1  ?Monitor CBC and INR daily ?Monitory for s/s of bleeding ?Will discuss change of azithromycin to doxycycline to minimize DDI with provider ? ?Cristela Felt, PharmD, BCPS ?Clinical Pharmacist ?12/09/2021 7:37 AM ? ? ? ? ?

## 2021-12-09 NOTE — Progress Notes (Signed)
Patient arrived to unit with RN and RT and patient on Bipap.  Patient appears to have had the IV in right foream where Amio infusing. ?

## 2021-12-09 NOTE — Progress Notes (Signed)
Pt placed on Bipap by RT due to tachypnea and increased WOB. Pt tolerating well at this time, RN at bedside, RT will continue to monitor.  ?

## 2021-12-09 NOTE — ED Notes (Signed)
This RN verified with pharmacy to potassium phosphate and d10 are compatible  ?

## 2021-12-09 NOTE — ED Notes (Signed)
RN attempted to get blood work--unsuccessful. RN asked phlebotomy to come see pt.  ?

## 2021-12-09 NOTE — Progress Notes (Addendum)
ANTICOAGULATION CONSULT NOTE - Follow Up Consult ? ?Pharmacy Consult for warfarin and lovenox ?Indication: atrial fibrillation ? ?Allergies  ?Allergen Reactions  ? Ciprofloxacin Swelling  ?  Site of swelling not recalled  ? Lactose Intolerance (Gi) Nausea And Vomiting and Other (See Comments)  ?  severe stomach pain  ? Penicillins Hives and Itching  ?  "haven't used any since 1970's" - have gotten cephalosporins multiple times ?Has patient had a PCN reaction causing immediate rash, facial/tongue/throat swelling, SOB or lightheadedness with hypotension: Yes ?Has patient had a PCN reaction causing severe rash involving mucus membranes or skin necrosis: Unk ?Has patient had a PCN reaction that required hospitalization: Unk ?Has patient had a PCN reaction occurring within the last 10 years: No ?If all of the above answers are "NO", then may proceed with C  ? Sulfa Antibiotics Hives and Itching  ?  "haven't used any since 1970's"  ? Sulfasalazine Hives and Itching  ?  "haven't used any since 1970's"  ? Depakote [Divalproex Sodium] Other (See Comments)  ? Lisinopril Other (See Comments)  ?  Other reaction(s): associated with hyperkalemia  ? ? ?Patient Measurements: ?Height: 5' (152.4 cm) ?Weight: 60.8 kg (134 lb) ?IBW/kg (Calculated) : 45.5 ? ?Vital Signs: ?Temp: 102.5 ?F (39.2 ?C) (03/07 2841) ?Temp Source: Axillary (03/07 3244) ?BP: 110/65 (03/07 1500) ?Pulse Rate: 116 (03/07 1500) ? ?Labs: ?Recent Labs  ?  12/16/2021 ?1622 12/04/2021 ?1638 12/26/2021 ?2300 12/09/21 ?0204 12/09/21 ?0224 12/09/21 ?0424 12/09/21 ?0810  ?HGB 11.6*   < > 9.0*  --  11.2*  --  10.2*  ?HCT 38.0   < > 28.7*  --  34.8*  --  30.0*  ?PLT PLATELET CLUMPS NOTED ON SMEAR, COUNT APPEARS ADEQUATE  --  150  --  145*  --   --   ?APTT 22*  --   --   --   --   --   --   ?LABPROT 16.9*  --   --   --  17.8*  --   --   ?INR 1.4*  --   --   --  1.5*  --   --   ?CREATININE 0.82  --   --  0.86  --   --   --   ?CKTOTAL  --   --  273*  --   --   --   --   ?TROPONINIHS   --    < > 73* 59* 50* 49*  --   ? < > = values in this interval not displayed.  ? ? ? ?Estimated Creatinine Clearance: 36.1 mL/min (by C-G formula based on SCr of 0.86 mg/dL). ? ? ?Medical History: ?Past Medical History:  ?Diagnosis Date  ? Atrial fibrillation ([place])   ? RVR hx.  Chronic Coumadin  ? Bacteremia 10/2018  ? C. difficile colitis 09/21/2013, 11/2013  ? Carotid stenosis   ? a. Carotid US (10/2013):  bilat 1-39%; f/u 1 year  ? Chronic lower back pain   ? COPD (chronic obstructive pulmonary disease) ([place])   ? Depression   ? Diverticulosis of colon without hemorrhage   ? Educated about COVID-19 virus infection 09/11/2019  ? Gait abnormality 04/11/2020  ? GERD (gastroesophageal reflux disease)   ? with HH  ? Glaucoma   ? HCAP (healthcare-associated pneumonia) 11/21/2011  ? High cholesterol   ? Hypertension   ? Influenza A 11/28/2018  ? Insomnia   ? Memory disorder 04/11/2020  ? Metabolic encephalopathy 0/10/270  ? Mitral  regurgitation 03/11/2012  ? Pain due to onychomycosis of toenails of both feet 01/29/2021  ? Pain in right foot 10/11/2018  ? Pancreatitis   ? ~2008, 11/2011 post ERCP  ? Pneumonia 12/2011  ? "first time I know about"  ? PVD (peripheral vascular disease) ([place])   ? Rupture of right rotator cuff 01/30/2020  ? Sepsis ([place]) 11/27/2013  ? SIRS (systemic inflammatory response syndrome) ([place]) 03/13/2012  ? a/w post ERCP  pancreatitis.   ? Syncope   ? T12 compression fracture, sequela 05/05/2021  ? Type II diabetes mellitus ([place])   ? Unsteady gait   ? ? ?Medications:  ?(Not in a hospital admission) ?Scheduled:  ? ipratropium-albuterol  3 mL Nebulization QID  ? warfarin  3 mg Oral ONCE-1600  ? Warfarin - Pharmacist Dosing Inpatient   Does not apply [id number]  ? ?Infusions:  ? amiodarone 30 mg/hr (12/09/21 1339)  ? ceFEPime (MAXIPIME) IV    ? magnesium sulfate bolus IVPB    ? [START ON 12/11/2021] vancomycin    ? ?PRN: acetaminophen **OR** acetaminophen, albuterol, dextrose, HYDROcodone-acetaminophen ? ?Assessment: ?77 yof with a  history of DM2, CHF systolic diastolic chf, HDL, HTN, dementia. A.fib on coumadin. Patient is presenting with AMS. Warfarin per pharmacy consult placed for AF. Pharmacy now consulted to bridge with Lovenox while INR is subtherapeutic. ? ?Home warfarin dose is 2.5 mg on TuTh and 1.'25mg'$  all other days. Last dose of warfarin prior to arrival was 3/5. History provided by patient's spouse. ? ?Today, INR of 1.5 is subtherapeutic. Hgb 11.2. Plt 145. No bleeding noted. Patient is on ceftriaxone, azithromycin, and amiodarone which may increase INR.  ? ?Goal of Therapy:  ?INR 2-3 ?Monitor platelets by anticoagulation protocol: Yes ?  ?Plan:  ?Give Warfarin '3mg'$  x1 as previously scheduled ?Start Lovenox 60 mg BID while INR is subtherapeutic ?Monitor CBC and INR daily ?Monitory for s/s of bleeding ? ?Thank you for allowing pharmacy to participate in this patient's care. ? ?Reatha Harps, PharmD ?PGY1 Pharmacy Resident ?12/09/2021 4:17 PM ?Check [email or web address] for unit specific pharmacy number ? ? ? ? ?

## 2021-12-09 NOTE — ED Notes (Signed)
MD Bridgett Larsson paged and notified of pt BP  ?

## 2021-12-09 NOTE — ED Notes (Signed)
This RN resumed care of pt at this time  ?

## 2021-12-09 NOTE — ED Notes (Signed)
MD notified that potassium and amiodarone can not run together. MD advised to get IV to start 3rd IV line. RN placed orders.  ?

## 2021-12-09 NOTE — Progress Notes (Signed)
Pharmacy Antibiotic Note ? ?[name] [name] is a 88 y.o. female admitted on 01/01/2022 with AMS.  Pharmacy has been consulted for vancomycin and cefepime dosing. Patient started on ceftriaxone and azithromycin and continues to be febrile. WBC wnl. Tmax 103.6.  ? ?Plan: ?Cefepime 2g x1 followed by cefepime 2g q12h ?Vancomycin '1250mg'$  x1 followed by vancomycin '1250mg'$  q48h (eAUC using 463 using scr 0.86) ?Monitor renal function, cultures, and clinical progression ? ?  ? ?Temp (24hrs), Avg:100.2 ?F (37.9 ?C), Min:97.7 ?F (36.5 ?C), Max:103.6 ?F (39.8 ?C) ? ?Recent Labs  ?Lab 12/17/2021 ?1622 12/14/2021 ?2300 12/09/21 ?0204 12/09/21 ?0224  ?WBC 13.1* 8.2  --  8.5  ?CREATININE 0.82  --  0.86  --   ?LATICACIDVEN 2.5* 1.5  --   --   ?  ?CrCl cannot be calculated (Unknown ideal weight.).   ? ?Allergies  ?Allergen Reactions  ? Ciprofloxacin Swelling  ?  Site of swelling not recalled  ? Lactose Intolerance (Gi) Nausea And Vomiting and Other (See Comments)  ?  severe stomach pain  ? Penicillins Hives and Itching  ?  "haven't used any since 1970's" - have gotten cephalosporins multiple times ?Has patient had a PCN reaction causing immediate rash, facial/tongue/throat swelling, SOB or lightheadedness with hypotension: Yes ?Has patient had a PCN reaction causing severe rash involving mucus membranes or skin necrosis: Unk ?Has patient had a PCN reaction that required hospitalization: Unk ?Has patient had a PCN reaction occurring within the last 10 years: No ?If all of the above answers are "NO", then may proceed with C  ? Sulfa Antibiotics Hives and Itching  ?  "haven't used any since 1970's"  ? Sulfasalazine Hives and Itching  ?  "haven't used any since 1970's"  ? Lisinopril Other (See Comments)  ?  Other reaction(s): associated with hyperkalemia  ? ? ?Antimicrobials this admission: ?Ceftriaxone/azithromycin x1 3/6 ?Cefepime 3/7 >>  ?Vancomycin 3/7 >>  ? ?Dose adjustments this admission: ? ? ?Microbiology results: ?3/6 bcx:  ?3/6  ucx: ?3/6 resp panel  ?  ?Thank you for allowing pharmacy to be a part of this patient?s care. ? ?Cristela Felt, PharmD, BCPS ?Clinical Pharmacist ?12/09/2021 8:11 AM ? ? ?

## 2021-12-09 NOTE — Progress Notes (Signed)
Lower extremity venous has been completed.  ? ?Preliminary results in CV Proc.  ? ?[name] [name] [name] [name] ?12/09/2021 3:36 PM    ?

## 2021-12-09 NOTE — ED Notes (Signed)
IV team at bedside 

## 2021-12-09 NOTE — Progress Notes (Signed)
Inpatient Diabetes Program Recommendations ? ?AACE/ADA: New Consensus Statement on Inpatient Glycemic Control (2015) ? ?Target Ranges:  Prepandial:   less than 140 mg/dL ?     Peak postprandial:   less than 180 mg/dL (1-2 hours) ?     Critically ill patients:  140 - 180 mg/dL  ? ?Lab Results  ?Component Value Date  ? GLUCAP 124 (H) 12/09/2021  ? HGBA1C 7.8 (H) 05/06/2021  ? ? ?Review of Glycemic Control ? ?Diabetes history: DM2 ?Outpatient Diabetes medications: Toujeo 18 units QHS, Amaryl 4 mg QAM ?Current orders for Inpatient glycemic control: None ? ?HgbA1C - 7.8% ? ?Inpatient Diabetes Program Recommendations:   ? ?Semglee 9 units QHS ?Novolog 0-9 units TID with meals ? ?Will continue to follow.  ? ?Thank you. ?Lorenda Peck, RD, LDN, CDE ?Inpatient Diabetes Coordinator ?[phone]  ? ? ? ? ? ? ? ? ? ? ?

## 2021-12-09 NOTE — Progress Notes (Signed)
Echocardiogram ?2D Echocardiogram has been performed. ? ?[name] [name] ?12/09/2021, 3:29 PM ?

## 2021-12-09 NOTE — ED Notes (Signed)
D50 given through IV at this time - pt states she does not want to drink anything  ?

## 2021-12-09 NOTE — ED Notes (Signed)
MD at bedside. 

## 2021-12-09 NOTE — ED Notes (Signed)
Pt IV infiltrated - this RN called pharmacy - cold compress can be applied  ?

## 2021-12-09 NOTE — Assessment & Plan Note (Signed)
Most likely demand ischemia in the setting of acute respiratory failure and increased work of breathing.  Obtain echogram continue to cycle troponins and order serial EKG ?

## 2021-12-09 NOTE — Consult Note (Addendum)
Cardiology Consultation:   Patient ID: [name] [name] MRN: [id number]; DOB: Aug 25, 1930  Admit date: 12/12/2021 Date of Consult: 12/09/2021  PCP:  Donnajean Lopes, MD   [facility] Providers Cardiologist:  Minus Breeding, MD   Patient Profile:   [name] [name] is a 88 y.o. female with a hx of permanent atrial fibrillation managed with cardizem and metorprolol and chronic anticoagulation with coumadin, carotid artery stenosis, COPD, GERD, HLD, HTN, IDDM, and dementia  who is being seen 12/09/2021 for the evaluation of heart failure at the request of Dr. Waldron Labs.  History of Present Illness:   [name] with the above PMH was last seen by Dr. Percival Spanish 09/2019 and was doing well at that time. She has mild carotid artery stenosis (2016) and mild mitral regurgitation on echo 2015. She was diagnosed with COVID-19 06/2020. She was hospitalized with compression fracture of T12 05/2021.   She presented to [facility] 12/11/2021 with altered mental status and generalized weakness.  She ambulates with a walker at baseline and suffered a fall on 12/07/21. She reportedly did not hit her head, but had persistent confusion since the fall. She was also hypoglycemic at home. At presentation, she was initially unresponsive.  Mentation improved after glucose. Past notes indicate recent med changes for better glycemic control.   Temp 102-103 WBC 13.1 Hb 11.6 Ammonia 38 Lactic acid 2.5 BNP 412 CK 273 Albumin 2.6 Mg 1.5 -->1.5 HS troponin 73 --> 59 --> 50 --> 49 K 2.6 --> 2.7 INR 1.5  Na 130 --> 128  UA consistent with UTI meeting criteria for sepsis. CT head nonacute. CXR with edema vs atypical PNA. Cardiology was consulted for heart failure and to determine volume status. She does not have a history of prior systolic heart failure.   Telemetry with Afib RVR and cardizem gtt was started. She had signs of respiratory distress and BiPAP was recommended. However, pt denied trouble breathing.     Rate not controlled with cardizem and IV amiodarone was initiated. Heparin gtt running in addition to ABX.   Hypokalemia treated with IV potassium. Hypomagnesemia treated with IV Mg.   Husband at bedside helps with history. Briefly, she started having increased urination on Thurs/Friday last week which he tried to treat with OTC medications. She started coughing on Saturday and was hypoglycemic on Sunday. Husband was attempting to get her ready for Urgent Care when she was unable to get off the toilet and was confused. He attempted to get her up and she was unable to move her left foot. She fell over her walker and DID hit her head.  She was BIB [facility].   Her confusion began prior to her fall and may have been related to her UTI, URI (cough), or hypoglycemia. He also states she was started on lasix about 3 months ago by PCP for lower extremity swelling. She has been taking 40 mg lasix daily without potassium supplementation. I do not see an updated echo in Epic. He was also treating with compression stockings.   On exam, pt appears to have JVD, but is on BiPAP. She answers yes/no to questions - she denies pain and does report shortness of breath prior to BiPAP placement.    Past Medical History:  Diagnosis Date   Atrial fibrillation ([place])    RVR hx.  Chronic Coumadin   Bacteremia 10/2018   C. difficile colitis 09/21/2013, 11/2013   Carotid stenosis    a. Carotid US (10/2013):  bilat 1-39%; f/u 1 year   Chronic  lower back pain    COPD (chronic obstructive pulmonary disease) (HCC)    Depression    Diverticulosis of colon without hemorrhage    Educated about COVID-19 virus infection 09/11/2019   Gait abnormality 04/11/2020   GERD (gastroesophageal reflux disease)    with HH   Glaucoma    HCAP (healthcare-associated pneumonia) 11/21/2011   High cholesterol    Hypertension    Influenza A 11/28/2018   Insomnia    Memory disorder 02/06/5624   Metabolic encephalopathy 03/07/8936   Mitral  regurgitation 03/11/2012   Pain due to onychomycosis of toenails of both feet 01/29/2021   Pain in right foot 10/11/2018   Pancreatitis    ~2008, 11/2011 post ERCP   Pneumonia 12/2011   "first time I know about"   PVD (peripheral vascular disease) ([place])    Rupture of right rotator cuff 01/30/2020   Sepsis ([place]) 11/27/2013   SIRS (systemic inflammatory response syndrome) ([place]) 03/13/2012   a/w post ERCP  pancreatitis.    Syncope    T12 compression fracture, sequela 05/05/2021   Type II diabetes mellitus ([place])    Unsteady gait     Past Surgical History:  Procedure Laterality Date   APPENDECTOMY  ~ 1960   CATARACT EXTRACTION W/ INTRAOCULAR LENS  IMPLANT, BILATERAL  2000's   CHOLECYSTECTOMY  1990's   Lap chole   COLONOSCOPY  01/2003   diverticulosis, 45m hyperplastic sigmoid polyp   COLONOSCOPY WITH PROPOFOL N/A 06/02/2018   Procedure: COLONOSCOPY WITH PROPOFOL;  Surgeon: CLavena Bullion DO;  [facility];  Service: Gastroenterology;  Laterality: N/A;   DILATION AND CURETTAGE OF UTERUS     ERCP  11/13/2011   Procedure: ENDOSCOPIC RETROGRADE CHOLANGIOPANCREATOGRAPHY (ERCP);  Surgeon: PBeryle Beams MD;  [facility];  Service: Endoscopy;  Laterality: N/A;   FLEXIBLE SIGMOIDOSCOPY N/A 05/02/2014   Procedure: FLEXIBLE SIGMOIDOSCOPY;  Surgeon: CGatha Mayer MD;  [facility]: [facility];  Service: Endoscopy;  Laterality: N/A;   HIP ARTHROPLASTY Left 01/07/2018   Procedure: ARTHROPLASTY BIPOLAR HIP (HEMIARTHROPLASTY);  Surgeon: VHiram Gash MD;  [facility]  Service: Orthopedics;  Laterality: Left;   TONSILLECTOMY AND ADENOIDECTOMY  ~ [place] ~ 1960's     Home Medications:  Prior to Admission medications   Medication Sig Start Date End Date Taking? Authorizing Provider  acetaminophen (TYLENOL) 325 MG tablet Take 650 mg by mouth every 6 (six) hours as needed for mild pain.    Yes [provider]  albuterol (PROVENTIL) (2.5 MG/3ML) 0.083%  nebulizer solution Take 2.5 mg by nebulization every 8 (eight) hours as needed for wheezing or shortness of breath. 07/01/18  Yes [provider]  diltiazem (DILT-XR) 120 MG 24 hr capsule TAKE 1 CAPSULE(120 MG) BY MOUTH DAILY Patient taking differently: Take 120 mg by mouth daily. 01/04/18  Yes HMinus Breeding MD  doxepin (SINEQUAN) 10 MG capsule Take 10 mg by mouth at bedtime. 11/02/19  Yes [provider]  furosemide (LASIX) 40 MG tablet Take 0.5 tablets (20 mg total) by mouth daily. Take one tablet (40 mg) by mouth once a day Patient taking differently: Take 40 mg by mouth daily. 05/09/21  Yes JDomenic Polite MD  gabapentin (NEURONTIN) 100 MG capsule Take 100 mg by mouth daily. 04/08/20  Yes [provider]  glimepiride (AMARYL) 4 MG tablet Take 4 mg by mouth daily with breakfast. 11/01/19  Yes [provider]  insulin glargine, 1 Unit Dial, (TOUJEO) 300 UNIT/ML Solostar Pen  Inject 8 Units into the skin every morning. Patient taking differently: Inject 18 Units into the skin at bedtime. 05/09/21  Yes Domenic Polite, MD  ketoconazole (NIZORAL) 2 % cream Apply 1 application. topically 2 (two) times daily. 12/04/21  Yes [provider]  latanoprost (XALATAN) 0.005 % ophthalmic solution Place 1 drop into both eyes at bedtime.   Yes [provider]  loperamide (LOPERAMIDE A-D) 2 MG tablet Take 1 tablet (2 mg total) by mouth as needed for diarrhea or loose stools (use at bedtime). Patient taking differently: Take 1-2 mg by mouth at bedtime as needed for diarrhea or loose stools. 11/27/14  Yes Gatha Mayer, MD  metoprolol tartrate (LOPRESSOR) 25 MG tablet Take 25 mg by mouth 2 (two) times daily.   Yes [provider]  Multiple Vitamin (MULTIVITAMIN WITH MINERALS) TABS Take 1 tablet by mouth daily.   Yes [provider]  Multiple Vitamins-Minerals (PRESERVISION AREDS 2) CAPS Take 1 capsule by mouth 2 (two) times daily.   Yes [provider]  ondansetron (ZOFRAN) 4 MG tablet Take 4 mg by mouth every 8 (eight) hours as needed for nausea or vomiting. 05/06/20  Yes [provider]  oxybutynin (DITROPAN) 5 MG tablet Take 5 mg by mouth at bedtime.  07/29/16  Yes [provider]  pantoprazole (PROTONIX) 40 MG tablet Take 1 tablet (40 mg total) by mouth 2 (two) times daily. 06/07/18 11/27/27 Yes Arrien, Jimmy Picket, MD  pilocarpine [facility]) 1 % ophthalmic solution Place 1 drop into both eyes 2 (two) times daily. 08/28/20  Yes [provider]  Probiotic Product (PROBIOTIC PO) Take 1 capsule by mouth daily.   Yes [provider]  rOPINIRole (REQUIP) 0.5 MG tablet Take 0.5 mg by mouth 3 (three) times daily.   Yes [provider]  rosuvastatin (CRESTOR) 10 MG tablet Take 10 mg by mouth at bedtime. 11/01/19  Yes [provider]  SYMBICORT 160-4.5 MCG/ACT inhaler Inhale 2 puffs into the lungs 2 (two) times daily. 03/08/14  Yes [provider]  timolol (TIMOPTIC) 0.5 % ophthalmic solution Place 1 drop into both eyes 2 (two) times daily. 09/21/19  Yes [provider]  triamcinolone (KENALOG) 0.025 % ointment Apply 1 application. topically 2 (two) times daily. 12/04/21  Yes [provider]  warfarin (COUMADIN) 2.5 MG tablet Take 1.25-2.5 mg by mouth See admin instructions. Take 2.5 mg  ( 1 tablet) on Tues & Thur and Take 1.25 mg (1/2 tab) all other days of the week. 04/08/20  Yes [provider]  BD PEN NEEDLE NANO U/F 32G X 4 MM MISC  05/13/20   [provider]  lidocaine (LIDODERM) 5 % Place 1 patch onto the skin daily. Remove & Discard patch within 12 hours or as directed by MD Patient not taking: Reported on 12/09/2021 05/09/21   Domenic Polite, MD    Inpatient Medications: Scheduled Meds:  ipratropium-albuterol  3 mL Nebulization QID   warfarin  3 mg Oral ONCE-1600   Warfarin - Pharmacist Dosing Inpatient   Does not apply q1600   Continuous  Infusions:  amiodarone 60 mg/hr (12/09/21 0930)   Followed by   amiodarone 30 mg/hr (12/09/21 1339)   ceFEPime (MAXIPIME) IV     [START ON 12/11/2021] vancomycin     PRN Meds: acetaminophen **OR** acetaminophen, albuterol, dextrose, HYDROcodone-acetaminophen  Allergies:    Allergies  Allergen Reactions   Ciprofloxacin Swelling    Site of swelling not recalled   Lactose Intolerance (Gi)  Nausea And Vomiting and Other (See Comments)    severe stomach pain   Penicillins Hives and Itching    "haven't used any since 1970's" - have gotten cephalosporins multiple times Has patient had a PCN reaction causing immediate rash, facial/tongue/throat swelling, SOB or lightheadedness with hypotension: Yes Has patient had a PCN reaction causing severe rash involving mucus membranes or skin necrosis: Unk Has patient had a PCN reaction that required hospitalization: Unk Has patient had a PCN reaction occurring within the last 10 years: No If all of the above answers are "NO", then may proceed with C   Sulfa Antibiotics Hives and Itching    "haven't used any since 1970's"   Sulfasalazine Hives and Itching    "haven't used any since 1970's"   Depakote [Divalproex Sodium] Other (See Comments)   Lisinopril Other (See Comments)    Other reaction(s): associated with hyperkalemia    Social History:   Social History   Socioeconomic History   Marital status: Married    Spouse name: [name]   Number of children: 0   Years of education: Not on file   Highest education level: Not on file  Occupational History   Not on file  Tobacco Use   Smoking status: Never   Smokeless tobacco: Never  Vaping Use   Vaping Use: Never used  Substance and Sexual Activity   Alcohol use: No   Drug use: No   Sexual activity: Not Currently  Other Topics Concern   Not on file  Social History Narrative   Lives at home with her husband.   No children   Caffeine- 1 daily   Social Determinants of Health   Financial  Resource Strain: Not on file  Food Insecurity: Not on file  Transportation Needs: Not on file  Physical Activity: Not on file  Stress: Not on file  Social Connections: Not on file  Intimate Partner Violence: Not on file    Family History:    Family History  Problem Relation Age of Onset   Stroke Father 17   Stroke Mother 51   Diabetes Mellitus II Mother    Heart disease Mother    Hypertension Mother    Diabetes Mellitus II Sister    Diabetes Mellitus II Brother    Anesthesia problems Neg Hx    Hypotension Neg Hx    Malignant hyperthermia Neg Hx    Pseudochol deficiency Neg Hx    Stomach cancer Neg Hx    Esophageal cancer Neg Hx      ROS:  Please see the history of present illness.   All other ROS reviewed and negative.     Physical Exam/Data:   Vitals:   12/09/21 1200 12/09/21 1245 12/09/21 1300 12/09/21 1345  BP: 129/69 128/62 (!) 142/67 103/75  Pulse: 85 92 89 (!) 101  Resp: (!) 32 (!) 40 (!) 41 18  Temp:      TempSrc:      SpO2: 98% 96% 95% 98%  Weight:      Height:        Intake/Output Summary (Last 24 hours) at 12/09/2021 1412 Last data filed at 12/09/2021 1309 Gross per 24 hour  Intake 1555.97 ml  Output --  Net 1555.97 ml   Last 3 Weights 12/09/2021 05/01/2021 04/11/2020  Weight (lbs) 134 lb 130 lb 155 lb  Weight (kg) 60.782 kg 58.968 kg 70.308 kg     Body mass index is 26.17 kg/m.  General:  elderly female on BiPAP HEENT: normal  Neck: + JVD - exam difficult Vascular: No carotid bruits difficult given BiPAP sounds Cardiac:  irregular rhythm, regular rate Lungs:  on BiPAP, course sounds throughout Abd: soft, nontender, no hepatomegaly  Ext: no edema Musculoskeletal:  No deformities, BUE and BLE strength normal and equal Skin: warm and dry  Neuro:  CNs 2-12 intact, no focal abnormalities noted Psych:  Normal affect   EKG:  The EKG was personally reviewed and demonstrates:  atrial fibrillation with ventricular rate  107 Telemetry:  Telemetry was  personally reviewed and demonstrates:  Afib with rates between 90-130s  Relevant CV Studies:  Echo pending  Laboratory Data:  High Sensitivity Troponin:   Recent Labs  Lab 12/27/2021 2300 12/09/21 0204 12/09/21 0224 12/09/21 0424  TROPONINIHS 73* 59* 50* 49*     Chemistry Recent Labs  Lab 12/11/2021 1622 12/09/2021 1638 12/11/2021 2259 12/31/2021 2300 12/09/21 0204 12/09/21 0810  NA 130*   < > 133*  --  130* 128*  K 3.8   < > 2.6*  --  2.7* 3.3*  CL 94*  --   --   --  94*  --   CO2 23  --   --   --  22  --   GLUCOSE 236*  --   --   --  143*  --   BUN 12  --   --   --  11  --   CREATININE 0.82  --   --   --  0.86  --   CALCIUM 8.1*  --   --   --  7.8*  --   MG  --   --   --  1.5* 1.5*  --   GFRNONAA >60  --   --   --  >60  --   ANIONGAP 13  --   --   --  14  --    < > = values in this interval not displayed.    Recent Labs  Lab 12/27/2021 1622 12/07/2021 2300 12/09/21 0204  PROT 6.9 5.6* 6.0*  ALBUMIN 3.5 2.6* 3.0*  AST 48* 41 51*  ALT _0 ALKPHOS 60 46 46  BILITOT 0.7 0.5 0.5   Lipids No results for input(s): CHOL, TRIG, HDL, LABVLDL, LDLCALC, CHOLHDL in the last 168 hours.  Hematology Recent Labs  Lab 12/07/2021 1622 12/22/2021 1638 12/15/2021 2300 12/09/21 0224 12/09/21 0810  WBC 13.1*  --  8.2 8.5  --   RBC 4.36  --  3.35*   3.41* 4.07  --   HGB 11.6*   < > 9.0* 11.2* 10.2*  HCT 38.0   < > 28.7* 34.8* 30.0*  MCV 87.2  --  85.7 85.5  --   MCH 26.6  --  26.9 27.5  --   MCHC 30.5  --  31.4 32.2  --   RDW 15.5  --  15.7* 15.7*  --   PLT PLATELET CLUMPS NOTED ON SMEAR, COUNT APPEARS ADEQUATE  --  150 145*  --    < > = values in this interval not displayed.   Thyroid  Recent Labs  Lab 12/09/21 0334  TSH 2.705    BNP Recent Labs  Lab 12/05/2021 1932  BNP 412.0*    DDimer No results for input(s): DDIMER in the last 168 hours.   Radiology/Studies:  DG Chest 1 View  Result Date: 12/16/2021 CLINICAL DATA:  Altered mental status. EXAM: CHEST  1 VIEW  COMPARISON:  Prior chest radiographs 11/25/2018.  FINDINGS: Heart size at the upper limits of normal, unchanged. Aortic atherosclerosis. Mild bilateral interstitial prominence suggesting interstitial edema. No definite airspace consolidation. No evidence of pleural effusion or pneumothorax. No acute bony abnormality identified. IMPRESSION: Prominence of the bilateral interstitial lung markings, suggesting interstitial edema. Atypical/viral pneumonia can also have this appearance and clinical correlation is recommended. Aortic Atherosclerosis (ICD10-I70.0). Redemonstrated prominent mitral annular calcifications. Electronically Signed   By: Kellie Simmering D.O.   On: 12/29/2021 15:12   CT HEAD WO CONTRAST  Result Date: 12/10/2021 CLINICAL DATA:  Mental status change, unknown cause. Additional history provided: Generalized weakness, altered mental status, fall. EXAM: CT HEAD WITHOUT CONTRAST TECHNIQUE: Contiguous axial images were obtained from the base of the skull through the vertex without intravenous contrast. RADIATION DOSE REDUCTION: This exam was performed according to the departmental dose-optimization program which includes automated exposure control, adjustment of the mA and/or kV according to patient size and/or use of iterative reconstruction technique. COMPARISON:  Prior head CT examinations 11/26/2018 and earlier. Brain MRI 05/16/2018. FINDINGS: Brain: Moderate generalized cerebral atrophy. Comparatively mild cerebellar atrophy. Mild patchy and ill-defined hypoattenuation within the cerebral white matter, nonspecific but compatible with chronic small vessel ischemic disease. There is no acute intracranial hemorrhage. No demarcated cortical infarct. No extra-axial fluid collection. No evidence of an intracranial mass. No midline shift. Vascular: No hyperdense vessel.  Atherosclerotic calcifications. Skull: Normal. Negative for fracture or focal lesion. Sinuses/Orbits: Visualized orbits show no acute  finding. Mild mucosal thickening within the bilateral frontoethmoidal recesses. Mild mucosal thickening and fluid within the bilateral ethmoid sinuses. Mild mucosal thickening within the bilateral sphenoid sinuses. Trace mucosal thickening within the left maxillary sinus at the imaged levels. Other: Subtle frontoparietal scalp soft tissue swelling (series 5, image 30). IMPRESSION: No evidence of acute intracranial abnormality. Subtle frontoparietal scalp soft tissue swelling slightly eccentric to the right. Mild chronic small vessel image changes within the cerebral white matter, stable from the prior head CT of 11/26/2018. Moderate generalized cerebral atrophy. Comparatively mild cerebellar atrophy. Paranasal sinus disease at the imaged levels, as described. Electronically Signed   By: Kellie Simmering D.O.   On: 12/19/2021 15:25   DG Chest Port 1 View  Result Date: 12/09/2021 CLINICAL DATA:  88 year old female with history of dyspnea. EXAM: PORTABLE CHEST 1 VIEW COMPARISON:  Chest x-ray 12/05/2021. FINDINGS: Lung volumes are low. Diffuse hazy ill-defined opacities and diffuse interstitial prominence noted throughout the lungs bilaterally. Diffuse peribronchial cuffing. Ill-defined somewhat nodular opacity in the right mid lung, new compared to the prior study, presumably a focus of developing infection. No definite pleural effusions. Mild cardiomegaly. The patient is rotated to the right on today's exam, resulting in distortion of the mediastinal contours and reduced diagnostic sensitivity and specificity for mediastinal pathology. Atherosclerotic calcifications in the thoracic aorta. IMPRESSION: 1. Findings are concerning for severe bronchitis, likely with developing right upper lobe pneumonia, as above. 2. Aortic atherosclerosis. 3. Mild cardiomegaly. Electronically Signed   By: Vinnie Langton M.D.   On: 12/09/2021 08:23   DG Hip Unilat W or Wo Pelvis 2-3 Views Right  Result Date: 12/04/2021 CLINICAL DATA:   Fall EXAM: DG HIP (WITH OR WITHOUT PELVIS) 2-3V RIGHT COMPARISON:  None. FINDINGS: Status post left hip hemiarthroplasty with no evidence of hardware fracture or loosening. No evidence of left hip dislocation on this single frontal view. No pelvic fracture or diastasis. No right hip fracture or dislocation. Diffuse osteopenia. No aggressive appearing focal osseous lesions. Mild right hip osteoarthritis. Degenerative changes in the  visualized lower lumbar spine. IMPRESSION: No osseous fracture. No right hip dislocation. Status post left hip hemiarthroplasty without evidence of hardware complication. Diffuse osteopenia. Electronically Signed   By: Ilona Sorrel M.D.   On: 01/01/2022 17:10     Assessment and Plan:   Permanent atrial fibrillation Afib with RVR Maintained on cardizem and metoprolol at home Cardizem gtt started for RVR, was not rate controlled and started on IV amiodarone - telemetry with rates in the 90-130s - metoprolol has been held - do not suspect she is in decompensated CHF or in a low flow state   Chronic anticoagulation Maintained on coumadin, INR 1.5 Heparin gtt started, coumadin per pharmacy Will need to monitor INR with amiodarone   Respiratory distress Likely diastolic heart failure given history of hypertensive heart disease  Hx of lower extremity swelling recently placed on lasix - unclear how much HFpEF may be contributing - echo pending - last echo 2015 with preserved EF - BNP mildly elevated in the setting of acute illness and CrCl 36 - has been on 40 mg lasix daily per PCP - per epic review, was on daily lasix in Aug 2022 - they eat out almost daily, likely sodium contributing to lower extremity swelling - would recommend one dose of 40 mg IV lasix and monitor response   Hypertension - BP has been labile - would not start anything new   IDDM - per primary - husband has been managing at home with direction from endocrinology - would probably allow her  to run a bit higher to avoid hypoglycemia and further falls   Fall Was confused prior to fall, denies chest pain and respiratory distress prior to fall. Question role of UTI and hypoglycemia, do not suspect a cardiac reason for her fall. She was also unable to move her left foot, can move her left foot on exam. CT head negative for acute findings.    Dementia Husband states she walks with a walker and is conversant at baseline.    Complex situation. She started having lower extremity swelling and was started on lasix 40 mg by PCP about 3 months ago without potassium supplementation. Echo pending, she does not have a history of systolic heart failure, BNP mildly elevated in the setting of CrCl 36. She was hypokalemic, hypomagnesemic, and hyponatremic on presentation. Unclear if hyponatremia is in the setting of hyper- or hypo- volemia, but I suspect mild hypervolemia. Cardizem has been stopped and she is placed on IV amiodarone with general rate control given sepsis and respiratory failure. Continue amiodarone, consider adding back metoprolol, continue anticoagulation. Pending clinical course and echo results, may need a right heart cath to better define her fluid status. Can trial one dose of 40 mg IV lasix to monitor response.     Risk Assessment/Risk Scores:     New York Heart Association (NYHA) Functional Class NYHA Class III  CHA2DS2-VASc Score = 6   This indicates a 9.7% annual risk of stroke. The patient's score is based upon: CHF History: 0 HTN History: 1 Diabetes History: 1 Stroke History: 0 Vascular Disease History: 1 Age Score: 2 Gender Score: 1      For questions or updates, please contact [place] Please consult [email or web address] for contact info under    Signed, Ledora Bottcher, [place]  12/09/2021 2:12 PM  Patient seen and examined and agree with Fabian Sharp, PA as detailed above.  In brief, the patient is a 88 y.o. female with a hx of permanent atrial  fibrillation managed with cardizem and metorprolol and chronic anticoagulation with coumadin, carotid artery stenosis, COPD, GERD, HLD, HTN, IDDM, and dementia who presented to the ER with AMS and generalized weakness found to be hypoglycemia as well as septic from a UTI. Course complicated by acute hypoxic respiratory failure and Afib with RVR for which Cardiology has been consulted.   Patient previously followed by Dr. Percival Spanish but has not been seen in clinic since 2020. Presented on this admission with worsening AMS, weakness and fall. Here, she was unresponsive found to hypoglycemic. Received dextrose with improvement. UA notable for UTI and patient was initiated on broad spectrum ABX. Her course has been complicated by worsening respiratory status for which she was started on BiPAP. BNP 412. Trop 73>49. CK mildly elevated. Lactate 2.5.   TTE on my review with normal EF, normal RV function, significant MAC, RAP 74mHg, normal filling pressures.   Overall, does not appear grossly overloaded on examination and the degree of her hypoxia/respiratory distress is out-of-proportion to her volume status. Would hold diuresis for now given hypokalemia and soft blood pressures given she does not appear decompensated from HF standpoint. Suspect she has an evolving pneumonia vs bronchitis that is driving symptoms.   GEN: Confused, ripping off BiPAP   Neck: No JVD Cardiac: Irregular, no murmurs Respiratory: Diffusely rhonchorous GI: Soft, nontender, non-distended  MS: No edema; No deformity. Neuro:  Confused Psych: Agitated  Plan: -Does not appear grossly overloaded on exam and filling pressures normal on TTE -Degree of respiratory distress is out-of-proportion to volume status; suspect she has a primary lung pathology (Bronchitis vs developing pneumonia) -Continue management of sepsis per primary -Will hold diuresis for now given hypokalemia and soft blood pressures -Metop and dilt held due to  hypotension -Continue amiodarone gtt -Warfarin per pharmacy  HGwyndolyn Kaufman MD

## 2021-12-09 NOTE — ED Notes (Signed)
Husband is at bedside.

## 2021-12-09 NOTE — ED Notes (Signed)
On assessment by this RN pt with increased work of breathing with grunting - RT called to bedside to assess pt  ?

## 2021-12-09 NOTE — Progress Notes (Signed)
Pt needs to be on Cont. Bi-PAP due to her tachypnea. Pt cannot sustain w/o Bi-PAP on. RT increased IPAP from 8 to 10 to help increase tidal volumes. Pt seems anxious but not really confused. Would highly recommend pt stay on Bi-PAP for most of the time. RT will continue to monitor as needed. ?

## 2021-12-09 NOTE — ED Notes (Signed)
MD Chen at bedside.

## 2021-12-09 NOTE — ED Notes (Signed)
RN and NT cleaned pt up. New diaper, chuck, and bed sheet applied.  ?

## 2021-12-09 NOTE — Progress Notes (Signed)
BIPAP initiated due to increased WOB, increased RR. However, patient states she isn't having trouble breathing. PRN albuterol given for expiratory wheezing.  ?

## 2021-12-09 NOTE — Progress Notes (Signed)
PROGRESS NOTE    [name] [name]  ZOX:[id number] DOB: 1930-09-14 DOA: 12/24/2021 PCP: Donnajean Lopes, MD   Chief Complaint  Patient presents with   Altered Mental Status    Brief Narrative:   [name] [name] is a 88 y.o. female with medical history significant of    DM2, CHF systolic diastolic chf, HDL, HTN, dementia. A.fib on coumadin  ED secondary for generalized weakness, confusion, she was reported noted to be febrile, hypoglycemic, her work-up was significant for sepsis and volume overload, as well she is in respiratory distress where she required BiPAP.   Assessment & Plan:   Principal Problem:   Severe sepsis ([place]) Active Problems:   UTI (urinary tract infection)   DM II (diabetes mellitus, type II), controlled (HCC)   HTN (hypertension)   Elevated troponin   Pulmonary edema   Hypercholesteremia   Atrial fibrillation    COPD (chronic obstructive pulmonary disease) (HCC)   Acute encephalopathy   Hyponatremia   CAP (community acquired pneumonia)   Acute respiratory failure with hypoxia ([place])  Sepsis present on admission likely due to urinary source/UTI and possible pneumonia -Sepsis present on admission with lactic acid of 2.5, source is urinary, as well with under working diagnosis of multifocal pneumonia -Worsening overnight where she is required to go on BiPAP, have broadened her antibiotics to IV cefepime and vancomycin -Follow urine cultures, blood cultures, will need to repeat chest x-ray once appropriately diuresed to focal opacity related to volume overload versus infection.  Acute Hypoxic respiratory failure -With increased breathing overnight, continue with BiPAP -Chest x-ray initially significant for volume overload, repeat x-ray this morning with evolving right upper lobe pneumonia, will need to repeat imaging after appropriate diuresis -Follow on Legionella, strep pneumonia antigen -Patient will need IV diuresis when blood pressure has  improved  Hypokalemia/Hypophosphatemia/Hypomagnesemia -repleting , continue to monitor closely.  acute on chronic CHF, unspecified -2D echo is pending, but normal EF and echo 2015 -She will need IV diuresis when blood pressure has improved -Cardiology input greatly appreciated.  Acute metabolic encephalopathy -Likely due to infection -No focal deficits -We will keep n.p.o. even for meds till she able to take oral  Diabetes mellitus, type II, poorly controlled with hyperglycemia -She is hyperglycemic this morning, requiring 10 which has been discontinued, will monitor CBG closely and keep on D50 pushes as needed  A-fib with RVR -Heart rate uncontrolled, initially on Cardizem, but blood pressure has been soft so she has been transitioned to amiodarone drip -She is on heparin drip given INR subtherapeutic - will add prn metoprolol for uncontrolled heart rate  Hypertension -Blood pressure is soft, hold meds  Hyperlipidemia -Resume statin when able to take oral  Dementia -Continue with supportive care        DVT prophylaxis: on warfarin  Code Status: DNR Family Communication: Discussed with husband by phone Disposition:   Status is: Inpatient Remains inpatient appropriate because: respirstory failure, chf and sepsis   Consultants:  Cardiolgoy  Subjective:  Patient  is altered, unable to provide any complaints, lethargic, he was started on BiPAP overnight.  Objective: Vitals:   12/09/21 1245 12/09/21 1300 12/09/21 1345 12/09/21 1430  BP: 128/62 (!) 142/67 103/75 134/72  Pulse: 92 89 (!) 101 (!) 107  Resp: (!) 40 (!) 41 18 (!) 35  Temp:      TempSrc:      SpO2: 96% 95% 98% 99%  Weight:      Height:        Intake/Output Summary (  Last 24 hours) at 12/09/2021 1533 Last data filed at 12/09/2021 1423 Gross per 24 hour  Intake 1883.33 ml  Output --  Net 1883.33 ml   Filed Weights   12/09/21 0800  Weight: 60.8 kg    Examination:   Patient is frail,  ill-appearing, lethargic but open eyes and mumbles few words and follows some commands. Diminished air entry at the bases, with some use of accessory muscles and scattered rhonchi Irregular irregular, no rubs or gallops Bowel sounds present, abdomen soft, no rubs or gallops Extremity with no edema, clubbing or cyanosis.    Data Reviewed: I have personally reviewed following labs and imaging studies  CBC: Recent Labs  Lab 12/06/2021 1622 12/11/2021 1638 12/04/2021 2259 12/10/2021 2300 12/09/21 0224 12/09/21 0810  WBC 13.1*  --   --  8.2 8.5  --   NEUTROABS 11.1*  --   --  6.4 6.4  --   HGB 11.6* 11.9* 10.5* 9.0* 11.2* 10.2*  HCT 38.0 35.0* 31.0* 28.7* 34.8* 30.0*  MCV 87.2  --   --  85.7 85.5  --   PLT PLATELET CLUMPS NOTED ON SMEAR, COUNT APPEARS ADEQUATE  --   --  150 145*  --     Basic Metabolic Panel: Recent Labs  Lab 12/07/2021 1622 12/24/2021 1638 12/31/2021 2259 12/03/2021 2300 12/09/21 0204 12/09/21 0810  NA 130* 128* 133*  --  130* 128*  K 3.8 3.9 2.6*  --  2.7* 3.3*  CL 94*  --   --   --  94*  --   CO2 23  --   --   --  22  --   GLUCOSE 236*  --   --   --  143*  --   BUN 12  --   --   --  11  --   CREATININE 0.82  --   --   --  0.86  --   CALCIUM 8.1*  --   --   --  7.8*  --   MG  --   --   --  1.5* 1.5*  --   PHOS  --   --   --  2.3* 2.4*  --     GFR: Estimated Creatinine Clearance: 36.1 mL/min (by C-G formula based on SCr of 0.86 mg/dL).  Liver Function Tests: Recent Labs  Lab 12/12/2021 1622 12/18/2021 2300 12/09/21 0204  AST 48* 41 51*  ALT '19 15 16  '$ ALKPHOS 60 46 46  BILITOT 0.7 0.5 0.5  PROT 6.9 5.6* 6.0*  ALBUMIN 3.5 2.6* 3.0*    CBG: Recent Labs  Lab 12/09/21 0440 12/09/21 0601 12/09/21 0758 12/09/21 1012 12/09/21 1241  GLUCAP 149* 152* 133* 124* 98     Recent Results (from the past 240 hour(s))  Resp Panel by RT-PCR (Flu A&B, Covid) Peripheral     Status: None   Collection Time: 12/17/2021  6:22 PM   Specimen: Peripheral; Nasopharyngeal(NP)  swabs in vial transport medium  Result Value Ref Range Status   SARS Coronavirus 2 by RT PCR NEGATIVE NEGATIVE Final    Comment: (NOTE) SARS-CoV-2 target nucleic acids are NOT DETECTED.  The SARS-CoV-2 RNA is generally detectable in upper respiratory specimens during the acute phase of infection. The lowest concentration of SARS-CoV-2 viral copies this assay can detect is 138 copies/mL. A negative result does not preclude SARS-Cov-2 infection and should not be used as the sole basis for treatment or other patient management decisions. A negative result may  occur with  improper specimen collection/handling, submission of specimen other than nasopharyngeal swab, presence of viral mutation(s) within the areas targeted by this assay, and inadequate number of viral copies(<138 copies/mL). A negative result must be combined with clinical observations, patient history, and epidemiological information. The expected result is Negative.  Fact Sheet for Patients:  EntrepreneurPulse.com.au  Fact Sheet for Healthcare Providers:  IncredibleEmployment.be  This test is no t yet approved or cleared by the [place] FDA and  has been authorized for detection and/or diagnosis of SARS-CoV-2 by FDA under an Emergency Use Authorization (EUA). This EUA will remain  in effect (meaning this test can be used) for the duration of the COVID-19 declaration under Section 564(b)(1) of the Act, 21 U.S.C.section 360bbb-3(b)(1), unless the authorization is terminated  or revoked sooner.       Influenza A by PCR NEGATIVE NEGATIVE Final   Influenza B by PCR NEGATIVE NEGATIVE Final    Comment: (NOTE) The Xpert Xpress SARS-CoV-2/FLU/RSV plus assay is intended as an aid in the diagnosis of influenza from Nasopharyngeal swab specimens and should not be used as a sole basis for treatment. Nasal washings and aspirates are unacceptable for Xpert Xpress  SARS-CoV-2/FLU/RSV testing.  Fact Sheet for Patients: EntrepreneurPulse.com.au  Fact Sheet for Healthcare Providers: IncredibleEmployment.be  This test is not yet approved or cleared by the [place] FDA and has been authorized for detection and/or diagnosis of SARS-CoV-2 by FDA under an Emergency Use Authorization (EUA). This EUA will remain in effect (meaning this test can be used) for the duration of the COVID-19 declaration under Section 564(b)(1) of the Act, 21 U.S.C. section 360bbb-3(b)(1), unless the authorization is terminated or revoked.  Performed at [facility], [place] [address], [place], [place] [address]          Radiology Studies: DG Chest 1 View  Result Date: 01/02/2022 CLINICAL DATA:  Altered mental status. EXAM: CHEST  1 VIEW COMPARISON:  Prior chest radiographs 11/25/2018. FINDINGS: Heart size at the upper limits of normal, unchanged. Aortic atherosclerosis. Mild bilateral interstitial prominence suggesting interstitial edema. No definite airspace consolidation. No evidence of pleural effusion or pneumothorax. No acute bony abnormality identified. IMPRESSION: Prominence of the bilateral interstitial lung markings, suggesting interstitial edema. Atypical/viral pneumonia can also have this appearance and clinical correlation is recommended. Aortic Atherosclerosis (ICD10-I70.0). Redemonstrated prominent mitral annular calcifications. Electronically Signed   By: Kellie Simmering D.O.   On: 12/30/2021 15:12   CT HEAD WO CONTRAST  Result Date: 12/20/2021 CLINICAL DATA:  Mental status change, unknown cause. Additional history provided: Generalized weakness, altered mental status, fall. EXAM: CT HEAD WITHOUT CONTRAST TECHNIQUE: Contiguous axial images were obtained from the base of the skull through the vertex without intravenous contrast. RADIATION DOSE REDUCTION: This exam was performed according to the departmental dose-optimization  program which includes automated exposure control, adjustment of the mA and/or kV according to patient size and/or use of iterative reconstruction technique. COMPARISON:  Prior head CT examinations 11/26/2018 and earlier. Brain MRI 05/16/2018. FINDINGS: Brain: Moderate generalized cerebral atrophy. Comparatively mild cerebellar atrophy. Mild patchy and ill-defined hypoattenuation within the cerebral white matter, nonspecific but compatible with chronic small vessel ischemic disease. There is no acute intracranial hemorrhage. No demarcated cortical infarct. No extra-axial fluid collection. No evidence of an intracranial mass. No midline shift. Vascular: No hyperdense vessel.  Atherosclerotic calcifications. Skull: Normal. Negative for fracture or focal lesion. Sinuses/Orbits: Visualized orbits show no acute finding. Mild mucosal thickening within the bilateral frontoethmoidal recesses. Mild mucosal thickening  and fluid within the bilateral ethmoid sinuses. Mild mucosal thickening within the bilateral sphenoid sinuses. Trace mucosal thickening within the left maxillary sinus at the imaged levels. Other: Subtle frontoparietal scalp soft tissue swelling (series 5, image 30). IMPRESSION: No evidence of acute intracranial abnormality. Subtle frontoparietal scalp soft tissue swelling slightly eccentric to the right. Mild chronic small vessel image changes within the cerebral white matter, stable from the prior head CT of 11/26/2018. Moderate generalized cerebral atrophy. Comparatively mild cerebellar atrophy. Paranasal sinus disease at the imaged levels, as described. Electronically Signed   By: Kellie Simmering D.O.   On: 12/05/2021 15:25   DG Chest Port 1 View  Result Date: 12/09/2021 CLINICAL DATA:  88 year old female with history of dyspnea. EXAM: PORTABLE CHEST 1 VIEW COMPARISON:  Chest x-ray 01/02/2022. FINDINGS: Lung volumes are low. Diffuse hazy ill-defined opacities and diffuse interstitial prominence noted  throughout the lungs bilaterally. Diffuse peribronchial cuffing. Ill-defined somewhat nodular opacity in the right mid lung, new compared to the prior study, presumably a focus of developing infection. No definite pleural effusions. Mild cardiomegaly. The patient is rotated to the right on today's exam, resulting in distortion of the mediastinal contours and reduced diagnostic sensitivity and specificity for mediastinal pathology. Atherosclerotic calcifications in the thoracic aorta. IMPRESSION: 1. Findings are concerning for severe bronchitis, likely with developing right upper lobe pneumonia, as above. 2. Aortic atherosclerosis. 3. Mild cardiomegaly. Electronically Signed   By: Vinnie Langton M.D.   On: 12/09/2021 08:23   DG Hip Unilat W or Wo Pelvis 2-3 Views Right  Result Date: 12/14/2021 CLINICAL DATA:  Fall EXAM: DG HIP (WITH OR WITHOUT PELVIS) 2-3V RIGHT COMPARISON:  None. FINDINGS: Status post left hip hemiarthroplasty with no evidence of hardware fracture or loosening. No evidence of left hip dislocation on this single frontal view. No pelvic fracture or diastasis. No right hip fracture or dislocation. Diffuse osteopenia. No aggressive appearing focal osseous lesions. Mild right hip osteoarthritis. Degenerative changes in the visualized lower lumbar spine. IMPRESSION: No osseous fracture. No right hip dislocation. Status post left hip hemiarthroplasty without evidence of hardware complication. Diffuse osteopenia. Electronically Signed   By: Ilona Sorrel M.D.   On: 12/07/2021 17:10        Scheduled Meds:  ipratropium-albuterol  3 mL Nebulization QID   warfarin  3 mg Oral ONCE-1600   Warfarin - Pharmacist Dosing Inpatient   Does not apply q1600   Continuous Infusions:  amiodarone 30 mg/hr (12/09/21 1339)   ceFEPime (MAXIPIME) IV     [START ON 12/11/2021] vancomycin       LOS: 1 day      Phillips Climes, MD [facility]   To contact the attending provider between 7A-7P or  the covering provider during after hours 7P-7A, please log into the web site [email or web address] and access using universal [place] password for that web site. If you do not have the password, please call the hospital operator.  12/09/2021, 3:33 PM

## 2021-12-09 NOTE — ED Notes (Signed)
Pt with BM on self, pt cleaned up, clean brief placed, placed on purewick  ?

## 2021-12-09 NOTE — Assessment & Plan Note (Signed)
Chest x-ray x-ray clear COVID infectious versus pulmonary edema patient has not have a fever ?Negative procalcitonin swallow eval BNP and troponin ?Suggestive possibly of multiple fluid overload picture ?Soft blood pressures would be very difficult to diuresis.  Obtain echogram patient would benefit from cardiology consult ?

## 2021-12-09 NOTE — ED Notes (Signed)
MD Bridgett Larsson asked to come assess pt - pt pupils unequal, Aox3 ?

## 2021-12-09 NOTE — ED Notes (Signed)
MD notified of critical potassium

## 2021-12-09 NOTE — Progress Notes (Signed)
SLP Cancellation Note ? ?Patient Details ?Name: [name] [name] ?MRN: [id number] ?DOB: 1930/07/20 ? ? ?Cancelled treatment:       Reason Eval/Treat Not Completed: Medical issues which prohibited therapy (currently on BiPAP). Will f/u as able. ? ? ? ?Osie Bond., M.A. CCC-SLP ?Acute [facility] Services ?Pager [phone] ?Office [phone] ? ?12/09/2021, 9:15 AM ?

## 2021-12-09 NOTE — Progress Notes (Signed)
?  X-cover Note: ?Called by RN to come and assess patient. Pt is moderate respiratory distress. Purse lip breathing and grunting. Ekg shows rapid afib. HR130-140. Will start bipap and cardizem gtts. ? ? ? ? ? ? ?Kristopher Oppenheim, DO ?[facility] ? ?

## 2021-12-09 NOTE — Progress Notes (Signed)
PHARMACY - PHYSICIAN COMMUNICATION ?CRITICAL VALUE ALERT - BLOOD CULTURE IDENTIFICATION (BCID) ? ?[name] [name] is an 88 y.o. female who presented to [facility] with a chief complaint of AMS and generalized weakness post fall. ? ?Assessment:  on broad spectrum antibiotics for sepsis secondary to UTI or PNA.  BCID reveals Staph epi, Mec A+ in 1 of 4 bottles, possibly a contaminant.  Tmax 102.3, WBC WNL, UA dirty. ? ?Name of physician (or Provider) Contacted: Dr. [facility]  ? ?Current antibiotics: vancomycin and cefepime  ? ?Changes to prescribed antibiotics recommended:  ?Patient is on recommended antibiotics - No changes needed ?Check MRSA PCR, plan to D/C vanc in 24 hrs if improving per discussion with provider and narrow as able. ? ?Results for orders placed or performed during the hospital encounter of 12/30/2021  ?Blood Culture ID Panel (Reflexed) (Collected: 12/19/2021  2:40 PM)  ?Result Value Ref Range  ? Enterococcus faecalis NOT DETECTED NOT DETECTED  ? Enterococcus Faecium NOT DETECTED NOT DETECTED  ? Listeria monocytogenes NOT DETECTED NOT DETECTED  ? Staphylococcus species DETECTED (A) NOT DETECTED  ? Staphylococcus aureus (BCID) NOT DETECTED NOT DETECTED  ? Staphylococcus epidermidis DETECTED (A) NOT DETECTED  ? Staphylococcus lugdunensis NOT DETECTED NOT DETECTED  ? Streptococcus species NOT DETECTED NOT DETECTED  ? Streptococcus agalactiae NOT DETECTED NOT DETECTED  ? Streptococcus pneumoniae NOT DETECTED NOT DETECTED  ? Streptococcus pyogenes NOT DETECTED NOT DETECTED  ? A.calcoaceticus-baumannii NOT DETECTED NOT DETECTED  ? Bacteroides fragilis NOT DETECTED NOT DETECTED  ? Enterobacterales NOT DETECTED NOT DETECTED  ? Enterobacter cloacae complex NOT DETECTED NOT DETECTED  ? Escherichia coli NOT DETECTED NOT DETECTED  ? Klebsiella aerogenes NOT DETECTED NOT DETECTED  ? Klebsiella oxytoca NOT DETECTED NOT DETECTED  ? Klebsiella pneumoniae NOT DETECTED NOT DETECTED  ? Proteus species NOT  DETECTED NOT DETECTED  ? Salmonella species NOT DETECTED NOT DETECTED  ? Serratia marcescens NOT DETECTED NOT DETECTED  ? Haemophilus influenzae NOT DETECTED NOT DETECTED  ? Neisseria meningitidis NOT DETECTED NOT DETECTED  ? Pseudomonas aeruginosa NOT DETECTED NOT DETECTED  ? Stenotrophomonas maltophilia NOT DETECTED NOT DETECTED  ? Candida albicans NOT DETECTED NOT DETECTED  ? Candida auris NOT DETECTED NOT DETECTED  ? Candida glabrata NOT DETECTED NOT DETECTED  ? Candida krusei NOT DETECTED NOT DETECTED  ? Candida parapsilosis NOT DETECTED NOT DETECTED  ? Candida tropicalis NOT DETECTED NOT DETECTED  ? Cryptococcus neoformans/gattii NOT DETECTED NOT DETECTED  ? Methicillin resistance mecA/C DETECTED (A) NOT DETECTED  ? ?Keiandra Sullenger D. Mina Marble, PharmD, BCPS, BCCCP ?12/09/2021, 6:48 PM ? ?

## 2021-12-10 ENCOUNTER — Inpatient Hospital Stay (HOSPITAL_COMMUNITY): Payer: Medicare HMO

## 2021-12-10 ENCOUNTER — Inpatient Hospital Stay: Payer: Self-pay

## 2021-12-10 DIAGNOSIS — I4891 Unspecified atrial fibrillation: Secondary | ICD-10-CM | POA: Diagnosis not present

## 2021-12-10 DIAGNOSIS — A419 Sepsis, unspecified organism: Secondary | ICD-10-CM | POA: Diagnosis not present

## 2021-12-10 DIAGNOSIS — G9340 Encephalopathy, unspecified: Secondary | ICD-10-CM | POA: Diagnosis not present

## 2021-12-10 DIAGNOSIS — J9601 Acute respiratory failure with hypoxia: Secondary | ICD-10-CM | POA: Diagnosis not present

## 2021-12-10 LAB — CBC
HCT: 33.5 % — ABNORMAL LOW (ref 36.0–46.0)
Hemoglobin: 10.8 g/dL — ABNORMAL LOW (ref 12.0–15.0)
MCH: 26.9 pg (ref 26.0–34.0)
MCHC: 32.2 g/dL (ref 30.0–36.0)
MCV: 83.3 fL (ref 80.0–100.0)
Platelets: 162 10*3/uL (ref 150–400)
RBC: 4.02 MIL/uL (ref 3.87–5.11)
RDW: 15.7 % — ABNORMAL HIGH (ref 11.5–15.5)
WBC: 7.4 10*3/uL (ref 4.0–10.5)
nRBC: 0 % (ref 0.0–0.2)

## 2021-12-10 LAB — COMPREHENSIVE METABOLIC PANEL
ALT: 19 U/L (ref 0–44)
AST: 59 U/L — ABNORMAL HIGH (ref 15–41)
Albumin: 2.7 g/dL — ABNORMAL LOW (ref 3.5–5.0)
Alkaline Phosphatase: 43 U/L (ref 38–126)
Anion gap: 11 (ref 5–15)
BUN: 10 mg/dL (ref 8–23)
CO2: 20 mmol/L — ABNORMAL LOW (ref 22–32)
Calcium: 7.5 mg/dL — ABNORMAL LOW (ref 8.9–10.3)
Chloride: 96 mmol/L — ABNORMAL LOW (ref 98–111)
Creatinine, Ser: 0.76 mg/dL (ref 0.44–1.00)
GFR, Estimated: >60 mL/min (ref >60)
Glucose, Bld: 73 mg/dL (ref 70–99)
Potassium: 3.9 mmol/L (ref 3.5–5.1)
Sodium: 127 mmol/L — ABNORMAL LOW (ref 135–145)
Total Bilirubin: 0.4 mg/dL (ref 0.3–1.2)
Total Protein: 5.9 g/dL — ABNORMAL LOW (ref 6.5–8.1)

## 2021-12-10 LAB — PHOSPHORUS
Phosphorus: 4.1 mg/dL (ref 2.5–4.6)
Phosphorus: 2.9 mg/dL (ref 2.5–4.6)

## 2021-12-10 LAB — PROTIME-INR
INR: 2.2 — ABNORMAL HIGH (ref 0.8–1.2)
Prothrombin Time: 24.7 seconds — ABNORMAL HIGH (ref 11.4–15.2)

## 2021-12-10 LAB — MAGNESIUM
Magnesium: 2.3 mg/dL (ref 1.7–2.4)
Magnesium: 2.3 mg/dL (ref 1.7–2.4)

## 2021-12-10 LAB — C DIFFICILE QUICK SCREEN W PCR REFLEX
C Diff antigen: NEGATIVE
C Diff interpretation: NOT DETECTED
C Diff toxin: NEGATIVE

## 2021-12-10 LAB — GLUCOSE, CAPILLARY
Glucose-Capillary: 84 mg/dL (ref 70–99)
Glucose-Capillary: 84 mg/dL (ref 70–99)
Glucose-Capillary: 78 mg/dL (ref 70–99)
Glucose-Capillary: 74 mg/dL (ref 70–99)
Glucose-Capillary: 151 mg/dL — ABNORMAL HIGH (ref 70–99)

## 2021-12-10 MED ORDER — METOPROLOL TARTRATE 25 MG PO TABS
25.0000 mg | ORAL_TABLET | Freq: Two times a day (BID) | ORAL | Status: DC
  Administered 2021-12-10 – 2021-12-12 (×5): 25 mg via ORAL
  Filled 2021-12-10 (×5): qty 1

## 2021-12-10 MED ORDER — CYANOCOBALAMIN 1000 MCG/ML IJ SOLN
1000.0000 ug | Freq: Every day | INTRAMUSCULAR | Status: DC
  Administered 2021-12-11 – 2021-12-13 (×3): 1000 ug via SUBCUTANEOUS
  Filled 2021-12-10 (×3): qty 1

## 2021-12-10 MED ORDER — LATANOPROST 0.005 % OP SOLN
1.0000 [drp] | Freq: Every day | OPHTHALMIC | Status: DC
  Administered 2021-12-10 – 2021-12-12 (×3): 1 [drp] via OPHTHALMIC
  Filled 2021-12-10: qty 2.5

## 2021-12-10 MED ORDER — CYANOCOBALAMIN 1000 MCG/ML IJ SOLN: 1000.0000 ug | INTRAMUSCULAR | Status: DC

## 2021-12-10 MED ORDER — SODIUM CHLORIDE 0.9% FLUSH
10.0000 mL | INTRAVENOUS | Status: DC | PRN
  Administered 2021-12-12: 10 mL

## 2021-12-10 MED ORDER — OSMOLITE 1.2 CAL PO LIQD
1000.0000 mL | ORAL | Status: DC
  Administered 2021-12-10 – 2021-12-13 (×2): 1000 mL
  Filled 2021-12-10 (×6): qty 1000

## 2021-12-10 MED ORDER — PROSOURCE TF PO LIQD
45.0000 mL | Freq: Every day | ORAL | Status: DC
  Administered 2021-12-10 – 2021-12-13 (×4): 45 mL
  Filled 2021-12-10 (×4): qty 45

## 2021-12-10 MED ORDER — WARFARIN SODIUM 2 MG PO TABS
2.0000 mg | ORAL_TABLET | Freq: Once | ORAL | Status: AC
  Administered 2021-12-10: 2 mg via ORAL
  Filled 2021-12-10: qty 1

## 2021-12-10 MED ORDER — FREE WATER
150.0000 mL | Freq: Four times a day (QID) | Status: DC
  Administered 2021-12-10 – 2021-12-13 (×11): 150 mL

## 2021-12-10 MED ORDER — IPRATROPIUM-ALBUTEROL 0.5-2.5 (3) MG/3ML IN SOLN
3.0000 mL | Freq: Two times a day (BID) | RESPIRATORY_TRACT | Status: DC
  Administered 2021-12-10 – 2021-12-12 (×5): 3 mL via RESPIRATORY_TRACT
  Filled 2021-12-10 (×7): qty 3

## 2021-12-10 NOTE — Progress Notes (Signed)
Peripherally Inserted Central Catheter Placement ? ?The IV Nurse has discussed with the patient and/or persons authorized to consent for the patient, the purpose of this procedure and the potential benefits and risks involved with this procedure.  The benefits include less needle sticks, lab draws from the catheter, and the patient may be discharged home with the catheter. Risks include, but not limited to, infection, bleeding, blood clot (thrombus formation), and puncture of an artery; nerve damage and irregular heartbeat and possibility to perform a PICC exchange if needed/ordered by physician.  Alternatives to this procedure were also discussed.  Bard Power PICC patient education guide, fact sheet on infection prevention and patient information card has been provided to patient /or left at bedside.   ? ?PICC Placement Documentation  ?PICC Double Lumen 79/89/21 Right Basilic 35 cm 0 cm (Active)  ?Indication for Insertion or Continuance of Line Poor Vasculature-patient has had multiple peripheral attempts or PIVs lasting less than 24 hours 12/10/21 1242  ?Exposed Catheter (cm) 0 cm 12/10/21 1242  ?Site Assessment Clean, Dry, Intact 12/10/21 1242  ?Lumen #1 Status Flushed;Blood return noted 12/10/21 1242  ?Lumen #2 Status Flushed;Blood return noted;Saline locked 12/10/21 1242  ?Dressing Type Transparent 12/10/21 1242  ?Dressing Status Antimicrobial disc in place 12/10/21 1242  ?Dressing Change Due 12/17/21 12/10/21 1242  ? ? ? ? ? ?[name] [name] ?12/10/2021, 12:44 PM ? ?

## 2021-12-10 NOTE — Progress Notes (Signed)
ANTICOAGULATION CONSULT NOTE - Follow Up Consult ? ?Pharmacy Consult for Warfarin and Lovenox ?Indication: atrial fibrillation ? ?Allergies  ?Allergen Reactions  ? Ciprofloxacin Swelling  ?  Site of swelling not recalled  ? Lactose Intolerance (Gi) Nausea And Vomiting and Other (See Comments)  ?  severe stomach pain  ? Penicillins Hives and Itching  ?  "haven't used any since 1970's" - have gotten cephalosporins multiple times ?Has patient had a PCN reaction causing immediate rash, facial/tongue/throat swelling, SOB or lightheadedness with hypotension: Yes ?Has patient had a PCN reaction causing severe rash involving mucus membranes or skin necrosis: Unk ?Has patient had a PCN reaction that required hospitalization: Unk ?Has patient had a PCN reaction occurring within the last 10 years: No ?If all of the above answers are "NO", then may proceed with C  ? Sulfa Antibiotics Hives and Itching  ?  "haven't used any since 1970's"  ? Sulfasalazine Hives and Itching  ?  "haven't used any since 1970's"  ? Depakote [Divalproex Sodium] Other (See Comments)  ? Lisinopril Other (See Comments)  ?  Other reaction(s): associated with hyperkalemia  ? ? ?Patient Measurements: ?Height: 5' (152.4 cm) ?Weight: 60.8 kg (134 lb) ?IBW/kg (Calculated) : 45.5 ?Lovenox Dosing Weight: 60 kg ? ?Vital Signs: ?Temp: 99.3 ?F (37.4 ?C) (03/08 5397) ?Temp Source: Axillary (03/08 0916) ?BP: 161/62 (03/08 1132) ?Pulse Rate: 99 (03/08 1132) ? ?Labs: ?Recent Labs  ?  12/19/2021 ?1622 12/23/2021 ?1638 12/18/2021 ?2300 12/09/21 ?0204 12/09/21 ?0224 12/09/21 ?0424 12/09/21 ?0810 12/09/21 ?1811 12/10/21 ?6734 12/10/21 ?1937  ?HGB 11.6*   < > 9.0*  --  11.2*  --  10.2*  --   --  10.8*  ?HCT 38.0   < > 28.7*  --  34.8*  --  30.0*  --   --  33.5*  ?PLT PLATELET CLUMPS NOTED ON SMEAR, COUNT APPEARS ADEQUATE  --  150  --  145*  --   --   --   --  162  ?APTT 22*  --   --   --   --   --   --   --   --   --   ?LABPROT 16.9*  --   --   --  17.8*  --   --   --   --  24.7*   ?INR 1.4*  --   --   --  1.5*  --   --   --   --  2.2*  ?CREATININE 0.82  --   --  0.86  --   --   --  0.78 0.76  --   ?CKTOTAL  --   --  273*  --   --   --   --   --   --   --   ?TROPONINIHS  --    < > 73* 59* 50* 49*  --   --   --   --   ? < > = values in this interval not displayed.  ? ? ?Estimated Creatinine Clearance: 38.8 mL/min (by C-G formula based on SCr of 0.76 mg/dL). ? ?Assessment: ?53 yof with a history of DM2, CHF systolic diastolic chf, HDL, HTN, dementia. A.fib on coumadin. Patient is presenting with AMS. Warfarin per pharmacy consult placed for atrial fibrillation. INR 1.4 on admit 3/6 and Lovenox bridge was added while INR subtherapeutic. ? ?  INR up to 2.2 today, therapeutic. Unable to take Warfarin 3 mg PO on 3/7 while on BiPAP. Now  off BiPAP and able to swallow, Warfarin to continue. On amiodarone drip, which may increase sensitivity to warfarin doses. ? ?PTA warfarin regimen: 2.5 mg on TuTh and 1.25 mg all other days.  ? - Last dose PTA on 3/5.  ? ?Goal of Therapy:  ?INR 2-3 ?Monitor platelets by anticoagulation protocol: Yes ?  ?Plan:  ?Discontinue Lovenox. ?Warfarin 2 mg x 1 today. ?Daily PT/INR ?Watch for increased sensitivity to warfarin doses while on Amiodarone. ? ?Arty Baumgartner, RPh ?12/10/2021,12:03 PM ? ? ?

## 2021-12-10 NOTE — NC FL2 (Signed)
? MEDICAID FL2 LEVEL OF CARE SCREENING TOOL  ?  ? ?IDENTIFICATION  ?Patient Name: ?[name] [name] Birthdate: Oct 02, 1930 Sex: female Admission Date (Current Location): ?01/02/2022  ?[place] and [place] Number: ? [place] ?  Facility and Address:  ?[facility], [place] [address], [place], [place] [address] ?     Provider Number: ?[id number]  ?Attending Physician Name and Address:  ?Elgergawy, Silver Huguenin, MD ? Relative Name and Phone Number:  ?  ?   ?Current Level of Care: ?Hospital Recommended Level of Care: ?[place] Prior Approval Number: ?  ? ?Date Approved/Denied: ?  PASRR Number: ?[id number] A ? ?Discharge Plan: ?SNF ?  ? ?Current Diagnoses: ?Patient Active Problem List  ? Diagnosis Date Noted  ? Elevated troponin 12/09/2021  ? Pulmonary edema 12/09/2021  ? Hyponatremia 12/22/2021  ? CAP (community acquired pneumonia) 12/16/2021  ? Acute respiratory failure with hypoxia ([place]) 12/03/2021  ? Gait abnormality 04/11/2020  ? Memory disorder 04/11/2020  ? Disorder of tooth development 01/30/2020  ? Primary open angle glaucoma (POAG) of both eyes, moderate stage 11/09/2019  ? Chronic atrial fibrillation ([place]) 09/11/2019  ? Dizziness 09/11/2019  ? Localized edema 02/21/2019  ? Pincer nail deformity 12/26/2018  ? Cellulitis of finger of right hand 12/21/2018  ? Branch retinal vein occlusion of left eye with macular edema 12/15/2018  ? Intermediate stage nonexudative age-related macular degeneration of both eyes 12/15/2018  ? Pseudophakia of both eyes 12/15/2018  ? Acute encephalopathy 11/26/2018  ? Bacteremia 10/26/2018  ? SIRS (systemic inflammatory response syndrome) ([place]) 10/25/2018  ? Dysphagia 10/11/2018  ? Restless legs syndrome 07/01/2018  ? Hemorrhoids   ? Shock ([place])   ? Candidiasis of skin and nails 02/21/2018  ? DM neuropathy, type II diabetes mellitus ([place]) 01/07/2018  ? Hip fracture ([place]) 01/06/2018  ? Advance care planning   ? Goals of care, counseling/discussion    ? COPD (chronic obstructive pulmonary disease) ([place]) 10/28/2017  ? Overactive bladder 07/29/2016  ? RLQ abdominal pain 04/24/2014  ? Nausea alone 04/11/2014  ? Major depressive disorder, recurrent episode ([place]) 01/16/2014  ? Severe sepsis ([place]) 11/27/2013  ? Generalized anxiety disorder 10/24/2013  ? Type 2 diabetes mellitus without retinopathy ([place]) 03/11/2013  ? Abdominal pain 08/05/2012  ? Gastro-esophageal reflux disease without esophagitis 05/20/2012  ? Low back pain 05/20/2012  ? Noninfective gastroenteritis and colitis, unspecified 05/20/2012  ? Irritable bowel syndrome 04/12/2012  ? Chronic diarrhea 04/12/2012  ? Metabolic encephalopathy 78/46/9629  ? Encounter for long-term (current) use of anticoagulants 03/17/2012  ? Depression 03/13/2012  ? Anxiety 03/13/2012  ? Mitral regurgitation 03/11/2012  ? Chronic epigastric pain 01/25/2012  ? Coagulopathy ([place]) 12/12/2011  ? Symptomatic anemia 12/12/2011  ? Atrial fibrillation    ? UTI (urinary tract infection) 11/13/2011  ? DM II (diabetes mellitus, type II), controlled ([place]) 11/13/2011  ? HTN (hypertension) 11/13/2011  ? Hypercholesteremia 11/13/2011  ? Glaucoma (increased eye pressure) 11/13/2011  ? ? ?Orientation RESPIRATION BLADDER Height & Weight   ?  ?Self, Place ? O2 (Nasal cannula 4L) Incontinent, Indwelling catheter Weight: 134 lb (60.8 kg) ?Height:  5' (152.4 cm)  ?BEHAVIORAL SYMPTOMS/MOOD NEUROLOGICAL BOWEL NUTRITION STATUS  ?    Incontinent Diet (See dc summary)  ?AMBULATORY STATUS COMMUNICATION OF NEEDS Skin   ?Extensive Assist Verbally Normal ?  ?  ?  ?    ?     ?     ? ? ?Personal Care Assistance Level of Assistance  ?Bathing, Feeding, Dressing Bathing  Assistance: Maximum assistance ?Feeding assistance: Limited assistance ?Dressing Assistance: Maximum assistance ?   ? ?Functional Limitations Info  ?    ?  ?   ? ? ?SPECIAL CARE FACTORS FREQUENCY  ?PT (By licensed PT), OT (By licensed OT)   ?  ?PT Frequency: 5x/week ?OT Frequency: 5x/week ?  ?  ?   ?   ? ? ?Contractures Contractures Info: Not present  ? ? ?Additional Factors Info  ?Code Status, Allergies Code Status Info: DNR ?Allergies Info: Ciprofloxacin, Lactose Intolerance (Gi), Penicillins, Sulfa Antibiotics, Sulfasalazine, Depakote (Divalproex Sodium), Lisinopril ?  ?  ?  ?   ? ?Current Medications (12/10/2021):  This is the current hospital active medication list ?Current Facility-Administered Medications  ?Medication Dose Route Frequency Provider Last Rate Last Admin  ? acetaminophen (TYLENOL) tablet 650 mg  650 mg Oral Q6H PRN Toy Baker, MD      ? Or  ? acetaminophen (TYLENOL) suppository 650 mg  650 mg Rectal Q6H PRN Toy Baker, MD   650 mg at 12/09/21 0530  ? albuterol (PROVENTIL) (2.5 MG/3ML) 0.083% nebulizer solution 2.5 mg  2.5 mg Nebulization Q2H PRN Doutova, Anastassia, MD      ? amiodarone (NEXTERONE PREMIX) 360-4.14 MG/200ML-% (1.8 mg/mL) IV infusion  30 mg/hr Intravenous Continuous Elgergawy, Silver Huguenin, MD 16.67 mL/hr at 12/10/21 1338 30 mg/hr at 12/10/21 1338  ? ceFEPIme (MAXIPIME) 2 g in sodium chloride 0.9 % 100 mL IVPB  2 g Intravenous Q12H Henri Medal, RPH 200 mL/hr at 12/10/21 1006 2 g at 12/10/21 1006  ? Chlorhexidine Gluconate Cloth 2 % PADS 6 each  6 each Topical Daily Elgergawy, Silver Huguenin, MD   6 each at 12/10/21 1002  ? [START ON 12/11/2021] cyanocobalamin ((VITAMIN B-12)) injection 1,000 mcg  1,000 mcg Subcutaneous Daily Elgergawy, Silver Huguenin, MD      ? Followed by  ? [START ON 12/17/2021] cyanocobalamin ((VITAMIN B-12)) injection 1,000 mcg  1,000 mcg Subcutaneous Weekly Elgergawy, Silver Huguenin, MD      ? dextrose 50 % solution 50 mL  1 ampule Intravenous Once PRN Elgergawy, Silver Huguenin, MD      ? HYDROcodone-acetaminophen (NORCO/VICODIN) 5-325 MG per tablet 1-2 tablet  1-2 tablet Oral Q4H PRN Doutova, Anastassia, MD      ? ipratropium-albuterol (DUONEB) 0.5-2.5 (3) MG/3ML nebulizer solution 3 mL  3 mL Nebulization BID Elgergawy, Silver Huguenin, MD      ? metoprolol tartrate  (LOPRESSOR) injection 2.5 mg  2.5 mg Intravenous Q4H PRN Elgergawy, Silver Huguenin, MD   2.5 mg at 12/09/21 1904  ? sodium chloride flush (NS) 0.9 % injection 10-40 mL  10-40 mL Intracatheter PRN Elgergawy, Silver Huguenin, MD      ? warfarin (COUMADIN) tablet 2 mg  2 mg Oral ONCE-1600 Skeet Simmer, RPH      ? warfarin (COUMADIN) tablet 3 mg  3 mg Oral ONCE-1600 Henri Medal, RPH      ? Warfarin - Pharmacist Dosing Inpatient   Does not apply [id number] Toy Baker, MD   Given at 12/09/21 1539  ? ? ? ?Discharge Medications: ?Please see discharge summary for a list of discharge medications. ? ?Relevant Imaging Results: ? ?Relevant Lab Results: ? ? ?Additional Information ?SS#: [id number];  [place] COVID-19 Vaccine 10/25/19, 11/15/19, 11/05/20 ? ?Lissa Morales Mccartney Brucks, LCSW ? ? ? ? ?

## 2021-12-10 NOTE — Evaluation (Signed)
Clinical/Bedside Swallow Evaluation ?Patient Details  ?Name: [name] [name] ?MRN: [id number] ?Date of Birth: 06/12/1930 ? ?Today's Date: 12/10/2021 ?Time: SLP Start Time (ACUTE ONLY): 3086 SLP Stop Time (ACUTE ONLY): 1125 ?SLP Time Calculation (min) (ACUTE ONLY): 20 min ? ?Past Medical History:  ?Past Medical History:  ?Diagnosis Date  ? Atrial fibrillation ([place])   ? RVR hx.  Chronic Coumadin  ? Bacteremia 10/2018  ? C. difficile colitis 09/21/2013, 11/2013  ? Carotid stenosis   ? a. Carotid US (10/2013):  bilat 1-39%; f/u 1 year  ? Chronic lower back pain   ? COPD (chronic obstructive pulmonary disease) ([place])   ? Depression   ? Diverticulosis of colon without hemorrhage   ? Educated about COVID-19 virus infection 09/11/2019  ? Gait abnormality 04/11/2020  ? GERD (gastroesophageal reflux disease)   ? with HH  ? Glaucoma   ? HCAP (healthcare-associated pneumonia) 11/21/2011  ? High cholesterol   ? Hypertension   ? Influenza A 11/28/2018  ? Insomnia   ? Memory disorder 04/11/2020  ? Metabolic encephalopathy 02/09/8468  ? Mitral regurgitation 03/11/2012  ? Pain due to onychomycosis of toenails of both feet 01/29/2021  ? Pain in right foot 10/11/2018  ? Pancreatitis   ? ~2008, 11/2011 post ERCP  ? Pneumonia 12/2011  ? "first time I know about"  ? PVD (peripheral vascular disease) ([place])   ? Rupture of right rotator cuff 01/30/2020  ? Sepsis ([place]) 11/27/2013  ? SIRS (systemic inflammatory response syndrome) ([place]) 03/13/2012  ? a/w post ERCP  pancreatitis.   ? Syncope   ? T12 compression fracture, sequela 05/05/2021  ? Type II diabetes mellitus ([place])   ? Unsteady gait   ? ?Past Surgical History:  ?Past Surgical History:  ?Procedure Laterality Date  ? APPENDECTOMY  ~ 1960  ? CATARACT EXTRACTION W/ INTRAOCULAR LENS  IMPLANT, BILATERAL  2000's  ? CHOLECYSTECTOMY  1990's  ? Lap chole  ? COLONOSCOPY  01/2003  ? diverticulosis, 9m hyperplastic sigmoid polyp  ? COLONOSCOPY WITH PROPOFOL N/A 06/02/2018  ? Procedure: COLONOSCOPY WITH PROPOFOL;  Surgeon:  CLavena Bullion DO;  [facility];  Service: Gastroenterology;  Laterality: N/A;  ? DILATION AND CURETTAGE OF UTERUS    ? ERCP  11/13/2011  ? Procedure: ENDOSCOPIC RETROGRADE CHOLANGIOPANCREATOGRAPHY (ERCP);  Surgeon: PBeryle Beams MD;  [facility];  Service: Endoscopy;  Laterality: N/A;  ? FLEXIBLE SIGMOIDOSCOPY N/A 05/02/2014  ? Procedure: FLEXIBLE SIGMOIDOSCOPY;  Surgeon: CGatha Mayer MD;  Location: [facility];  Service: Endoscopy;  Laterality: N/A;  ? HIP ARTHROPLASTY Left 01/07/2018  ? Procedure: ARTHROPLASTY BIPOLAR HIP (HEMIARTHROPLASTY);  Surgeon: VHiram Gash MD;  [facility]  Service: Orthopedics;  Laterality: Left;  ? TONSILLECTOMY AND ADENOIDECTOMY  ~ 1939  ? VAGINAL HYSTERECTOMY  ~ 1960's  ? ?HPI:  ?Patient is an 88y.o. female with PMH: HDl, HTN, dementia, CHF, DM-2, afib on coumadin who presented to the hospital with confusion and generalized weakness as well as a fall on day of admission. Husband had reported that she did not hit her head. Patient diagnosed with severe sepsis. She was weaned off BiPAP to nasal cannula on 3/8 and has been NPO since admission.  ?  ?Assessment / Plan / Recommendation  ?Clinical Impression ? Patient presents with clinical s/s of dysphagia as per this bedside/clinical swallow evaluation. She was awake and alert but attention fluctuated and patient with baseline confusion with questionable exacerbation of confusion. Husband who was present in room reported that  at home, patient's PO intake has declined in the last 2-3 months. SLP observed patient consume cup sips of thin liquids (water) and she exhibited suspected swallow initiation delay, poor overall awareness to PO's and brief instance of increased RR from low to mid 20's to 33-38. As patient has recently come off of BiPAP support and is not able to maintain adequate alertness and attention to PO intake, SLP recommending to continue NPO status and continue to allow ice chips, water sips  PRN after oral care and when patient fully alert. SLP will follow patient for PO readiness. ?SLP Visit Diagnosis: Dysphagia, unspecified (R13.10) ?   ?Aspiration Risk ? Moderate aspiration risk;Risk for inadequate nutrition/hydration  ?  ?Diet Recommendation NPO;Ice chips PRN after oral care  ? ?Supervision: Full supervision/cueing for compensatory strategies  ?  ?Other  Recommendations Oral Care Recommendations: Oral care BID;Staff/trained caregiver to provide oral care   ? ?Recommendations for follow up therapy are one component of a multi-disciplinary discharge planning process, led by the attending physician.  Recommendations may be updated based on patient status, additional functional criteria and insurance authorization. ? ?Follow up Recommendations Other (comment) (TBD)  ? ? ?  ?Assistance Recommended at Discharge Frequent or constant Supervision/Assistance  ?Functional Status Assessment Patient has had a recent decline in their functional status and demonstrates the ability to make significant improvements in function in a reasonable and predictable amount of time.  ?Frequency and Duration min 2x/week  ?1 week ?  ?   ? ?Prognosis Prognosis for Safe Diet Advancement: Good  ? ?  ? ?Swallow Study   ?General Date of Onset: 12/10/21 ?HPI: Patient is an 88 y.o. female with PMH: HDl, HTN, dementia, CHF, DM-2, afib on coumadin who presented to the hospital with confusion and generalized weakness as well as a fall on day of admission. Husband had reported that she did not hit her head. Patient diagnosed with severe sepsis. She was weaned off BiPAP to nasal cannula on 3/8 and has been NPO since admission. ?Type of Study: Bedside Swallow Evaluation ?Previous Swallow Assessment: remote (2020) BSE, recommending Dys 3, thin ?Diet Prior to this Study: NPO ?Temperature Spikes Noted: No ?Respiratory Status: Nasal cannula ?History of Recent Intubation: No ?Behavior/Cognition: Alert;Confused;Pleasant  mood;Distractible;Requires cueing ?Oral Cavity Assessment: Within Functional Limits ?Oral Care Completed by SLP: Yes ?Oral Cavity - Dentition: Poor condition;Missing dentition ?Self-Feeding Abilities: Total assist ?Patient Positioning: Upright in bed ?Baseline Vocal Quality: Normal ?Volitional Cough: Cognitively unable to elicit ?Volitional Swallow: Unable to elicit  ?  ?Oral/Motor/Sensory Function Overall Oral Motor/Sensory Function: Other (comment) (limited assessment due to patient's inability to follow all commands, but appears [facility])   ?Ice Chips     ?Thin Liquid Thin Liquid: Impaired ?Presentation: Cup ?Oral Phase Impairments: Poor awareness of bolus ?Oral Phase Functional Implications: Other (comment) ?Pharyngeal  Phase Impairments: Suspected delayed Swallow;Change in Vital Signs ?Other Comments: RR increased briefly to 35-38  ?  ?Nectar Thick Nectar Thick Liquid: Not tested   ?Honey Thick Honey Thick Liquid: Not tested   ?Puree Puree: Not tested   ?Solid ? ? ?  Solid: Not tested  ? ?  ? ?Sonia Baller, MA, CCC-SLP ?Speech Therapy ? ? ?

## 2021-12-10 NOTE — Plan of Care (Signed)
  Problem: Activity: Goal: Ability to tolerate increased activity will improve Outcome: Progressing   Problem: Respiratory: Goal: Ability to maintain a clear airway will improve Outcome: Progressing   

## 2021-12-10 NOTE — Progress Notes (Signed)
CSW left voicemail for patient's spouse to discuss SNF recommendation.  ? ?Percell Locus Ajah Vanhoose ?LCSW, MSW, MHA ? ?

## 2021-12-10 NOTE — Progress Notes (Signed)
Initial Nutrition Assessment ? ?DOCUMENTATION CODES:  ? ?Non-severe (moderate) malnutrition in context of chronic illness ? ?INTERVENTION:  ? ?Tube Feeds via Cortrak: ?Start Osmolite 1.2 @ 25 mL/hr and advanced by 10 mL q8h until goal of 65 mL is reached. (1560 mL/day) ?45 mL ProSource TF - Daily ?150 mL free water flushes q6h  ? ?Provides 1912 kcal, 98 gm PRO, 1279 mL free water (1879 mL total free water) daily.  ?Monitor magnesium, potassium, and phosphorus BID for at least 3 days, MD to replete as needed, as pt is at risk for refeeding syndrome given malnutrition. ? ?NUTRITION DIAGNOSIS:  ? ?Moderate Malnutrition related to chronic illness (CHF, COPD) as evidenced by moderate fat depletion, moderate muscle depletion. ? ?GOAL:  ? ?Patient will meet greater than or equal to 90% of their needs ? ?MONITOR:  ? ?Labs, Weight trends, TF tolerance, Diet advancement ? ?REASON FOR ASSESSMENT:  ? ?Consult ?Assessment of nutrition requirement/status ? ?ASSESSMENT:  ? ?88 y.o. female presented to the ED with confusion, generalized weakness, and recent fall. PMH includes T2DM, COPD, HTN, GERD, dysphagia, and CHF. Pt admitted with sepsis 2/2 CAP and UTI, hypoglycemia, and possible pulmonary edema.  ? ?3/08 - Cortrak placed (tip stomach) ? ?Pt resting in bed, husband at bedside and provides history.  ? ?Husband reports that pt has not had an appetite for a while. Reports that her intake has steadily decreased over the past few months as well. Husband reports that they often were splitting meals when they would go out to eat and she was no longer eating half of her half of the meal. Also reports that pt has had ongoing issues with crowns falling off teeth and making it difficult for her to chew.  ? ?Per pt husband, UBW is 135-137# and pt is now down to 120-122#. Reports that weight loss has occurred in the past few months.  Suspect current weight is stated.  ? ?Pt unable to advance diet per SLP. Discussed placing Cortrak with MD,  MD agreeable. Discussed with pt and husband, both would like to proceed.  ? ?Medications reviewed and include: Vitamin B12, Warfarin, IV antibiotics  ?Labs reviewed: Sodium 127, 24 hr CBG 78-152 ?  ?NUTRITION - FOCUSED PHYSICAL EXAM: ? ?Flowsheet Row Most Recent Value  ?Orbital Region Moderate depletion  ?Upper Arm Region No depletion  ?Thoracic and Lumbar Region No depletion  ?Buccal Region Moderate depletion  ?Temple Region Mild depletion  ?Clavicle Bone Region Moderate depletion  ?Clavicle and Acromion Bone Region Moderate depletion  ?Scapular Bone Region Moderate depletion  ?Dorsal Hand Severe depletion  ?Patellar Region Moderate depletion  ?Anterior Thigh Region Moderate depletion  ?Posterior Calf Region Moderate depletion  ?Edema (RD Assessment) None  ?Hair Reviewed  ?Eyes Reviewed  ?Mouth Reviewed  ?Skin Reviewed  ?Nails Reviewed  ? ?  ? ? ?Diet Order:   ?Diet Order   ? ?       ?  Diet NPO time specified Except for: [employer], Sips with Meds  Diet effective now       ?  ? ?  ?  ? ?  ? ? ?EDUCATION NEEDS:  ? ?Not appropriate for education at this time ? ?Skin:  Skin Assessment: Reviewed RN Assessment ? ?Last BM:  3/8 ? ?Height:  ? ?Ht Readings from Last 1 Encounters:  ?12/09/21 5' (1.524 m)  ? ? ?Weight:  ? ?Wt Readings from Last 1 Encounters:  ?12/09/21 60.8 kg  ? ? ?Ideal Body Weight:  45.5  kg ? ?BMI:  Body mass index is 26.17 kg/m?. ? ?Estimated Nutritional Needs:  ? ?Kcal:  1800-2000 ? ?Protein:  90-105 grams ? ?Fluid:  >/= 1.8 L ? ? ? ?Hermina Barters RD, LDN ?Clinical Dietitian ?See AMiON for contact information.  ? ?

## 2021-12-10 NOTE — Evaluation (Signed)
Occupational Therapy Evaluation Patient Details Name: [name] [name] MRN: [id number] DOB: 1930/06/18 Today's Date: 12/10/2021   History of Present Illness Pt is an 88 y.o. female who present to the ED with confusion and weakness folloing a fall at home. Admitted with a diagnosis of severe sepsis.  PMH:  DM2, CHF systolic diastolic chf, HDL, HTN, dementia, A.fib on coumadin   Clinical Impression   Pt presents with diagnoses above and deficits in cardiopulmonary tolerance, cognition, sitting balance, and strength. Pt poor historian and unsure of PLOF report accuracy (reports no falls in 8 or 9 years though pt had a fall leading to this admission). Pt initially lethargic, awakened easily to voice and activity. Pt requires Max A for UB ADLs, Total A for LB ADLs bed level. Pt requires Total A x 2 for bed mobility, unable to maintain static sitting balance without significant support, and unable to progress OOB today. Based on current deficits, recommend SNF rehab at [place].   SpO2 88% on 5 L O2 with activity, increased WOB HR 80s up to 120s with activity, a fib     Recommendations for follow up therapy are one component of a multi-disciplinary discharge planning process, led by the attending physician.  Recommendations may be updated based on patient status, additional functional criteria and insurance authorization.   Follow Up Recommendations  Skilled nursing-short term rehab (<3 hours/day)    Assistance Recommended at Discharge Frequent or constant Supervision/Assistance  Patient can return home with the following Two people to help with walking and/or transfers;Two people to help with bathing/dressing/bathroom    Functional Status Assessment  Patient has had a recent decline in their functional status and demonstrates the ability to make significant improvements in function in a reasonable and predictable amount of time.  Equipment Recommendations  Other (comment) (TBD pending progress)     Recommendations for Other Services       Precautions / Restrictions Precautions Precautions: Fall;Other (comment) Precaution Comments: monitor O2, HR (a fib) Restrictions Weight Bearing Restrictions: No      Mobility Bed Mobility Overal bed mobility: Needs Assistance Bed Mobility: Rolling, Supine to Sit, Sit to Supine Rolling: Total assist   Supine to sit: Total assist, +2 for physical assistance Sit to supine: Total assist, +2 for physical assistance   General bed mobility comments: Able to follow one step commands but delayed initiation/motor planning, requiring Total A x 2 to sit EOB and return to supine. Total A for rolling during linen change    Transfers                   General transfer comment: deferred d/t poor endurance/balance      Balance Overall balance assessment: Needs assistance Sitting-balance support: Bilateral upper extremity supported, Feet supported Sitting balance-Leahy Scale: Zero Sitting balance - Comments: Max A to maintain sitting balance EOB Postural control: Posterior lean                                 ADL either performed or assessed with clinical judgement   ADL Overall ADL's : Needs assistance/impaired Eating/Feeding: NPO   Grooming: Moderate assistance;Bed level   Upper Body Bathing: Maximal assistance;Bed level   Lower Body Bathing: Total assistance;Bed level   Upper Body Dressing : Bed level;Maximal assistance   Lower Body Dressing: Total assistance;Bed level       Toileting- Clothing Manipulation and Hygiene: Total assistance;Bed level  General ADL Comments: Significant weakness, poor tone/coordination and unable to tolerate sitting EOB or maintaining balance.     Vision Ability to See in Adequate Light: 1 Impaired Patient Visual Report: No change from baseline Vision Assessment?: No apparent visual deficits     Perception     Praxis      Pertinent Vitals/Pain Pain  Assessment Pain Assessment: Faces Faces Pain Scale: Hurts little more Pain Location: "my joints" Pain Descriptors / Indicators: Grimacing, Guarding Pain Intervention(s): Monitored during session, Limited activity within patient's tolerance     Hand Dominance Right   Extremity/Trunk Assessment Upper Extremity Assessment Upper Extremity Assessment: Generalized weakness;RUE deficits/detail;LUE deficits/detail RUE Deficits / Details: edema around forearm, ROM WFL LUE Deficits / Details: edema around forearm, IV leaking, questionable infilltration - RN aware and present   Lower Extremity Assessment Lower Extremity Assessment: Defer to PT evaluation   Cervical / Trunk Assessment Cervical / Trunk Assessment: Kyphotic   Communication Communication Communication: No difficulties   Cognition Arousal/Alertness: Awake/alert, Lethargic Behavior During Therapy: Flat affect, WFL for tasks assessed/performed Overall Cognitive Status: No family/caregiver present to determine baseline cognitive functioning Area of Impairment: Attention, Following commands, Memory, Safety/judgement, Awareness, Problem solving                   Current Attention Level: Sustained Memory: Decreased short-term memory Following Commands: Follows one step commands consistently Safety/Judgement: Decreased awareness of deficits Awareness: Intellectual Problem Solving: Slow processing, Difficulty sequencing, Requires verbal cues, Requires tactile cues General Comments: pt lethargic on entry but easily awakens to voice. with activity, alertness improved. consistently follows one step commands, joking with staff appropriately. poor recall of PLOF and noted memory deficits (reports no falls in years but fell prior to this admission)     General Comments  SpO2 88% on 5 LO2 with activity, increased WOB by end of session - difficulty following pursed lip breathing education. Pt HR 80s at rest but a fib noted with  increase to 120s sitting EOB. RN/MD present to assess IV malfunction    Exercises     Shoulder Instructions      Home Living Family/patient expects to be discharged to:: Private residence Living Arrangements: Spouse/significant other Available Help at Discharge: Family Type of Home: House Home Access: Level entry     Home Layout: One level               Home Equipment: Conservation officer, nature (2 wheels);Wheelchair Probation officer (4 wheels)   Additional Comments: info obtained from previous admission as pt unable to provide full home setup d/t AMS      Prior Functioning/Environment Prior Level of Function : Needs assist;Patient poor historian/Family not available             Mobility Comments: per pt, she is ambulatory with a walker including walking community distances. per previous admission, pt uses wheelchair outside of the home. Pt denies falls in 8 or 9 years  but noted fall on 3/5 per chart ADLs Comments: reports Mod I for ADLs but per chart from previous admission, husband assists with ADLs.Husband typically completes IADLs        OT Problem List: Decreased strength;Impaired balance (sitting and/or standing);Decreased activity tolerance;Decreased coordination;Decreased cognition;Decreased safety awareness;Cardiopulmonary status limiting activity      OT Treatment/Interventions: Self-care/ADL training;Therapeutic exercise;Energy conservation;DME and/or AE instruction;Therapeutic activities;Patient/family education;Balance training    OT Goals(Current goals can be found in the care plan section) Acute Rehab OT Goals Patient Stated Goal: none stated today OT Goal Formulation:  Patient unable to participate in goal setting Time For Goal Achievement: 12/24/21 Potential to Achieve Goals: Fair  OT Frequency: Min 2X/week    Co-evaluation PT/OT/SLP Co-Evaluation/Treatment: Yes Reason for Co-Treatment: Complexity of the patient's impairments (multi-system  involvement);For patient/therapist safety;To address functional/ADL transfers   OT goals addressed during session: ADL's and self-care;Strengthening/ROM      AM-PAC OT "6 Clicks" Daily Activity     Outcome Measure Help from another person eating meals?: Total (NPO) Help from another person taking care of personal grooming?: A Lot Help from another person toileting, which includes using toliet, bedpan, or urinal?: Total Help from another person bathing (including washing, rinsing, drying)?: A Lot Help from another person to put on and taking off regular upper body clothing?: A Lot Help from another person to put on and taking off regular lower body clothing?: Total 6 Click Score: 9   End of Session Equipment Utilized During Treatment: Oxygen Nurse Communication: Other (comment) (IV malfunction/leaking)  Activity Tolerance: Patient limited by fatigue Patient left: in chair;with call bell/phone within reach  OT Visit Diagnosis: Unsteadiness on feet (R26.81);Other abnormalities of gait and mobility (R26.89);Muscle weakness (generalized) (M62.81);Other symptoms and signs involving cognitive function                Time: 0263-7858 OT Time Calculation (min): 16 min Charges:  OT General Charges $OT Visit: 1 Visit OT Evaluation $OT Eval Moderate Complexity: 1 Mod  Malachy Chamber, OTR/L Acute Rehab Services Office: [phone]   Layla Maw 12/10/2021, 11:21 AM

## 2021-12-10 NOTE — Progress Notes (Addendum)
PROGRESS NOTE    [name] [name]  DZH:[id number] DOB: 07-Apr-1930 DOA: 12/23/2021 PCP: Donnajean Lopes, MD   Chief Complaint  Patient presents with   Altered Mental Status    Brief Narrative:   [name] [name] is a 88 y.o. female with medical history significant of    DM2, CHF systolic diastolic chf, HDL, HTN, dementia. A.fib on coumadin  ED secondary for generalized weakness, confusion, she was reported noted to be febrile, hypoglycemic, her work-up was significant for sepsis and volume overload, as well she is in respiratory distress where she required BiPAP.   Assessment & Plan:   Principal Problem:   Severe sepsis ([place]) Active Problems:   UTI (urinary tract infection)   DM II (diabetes mellitus, type II), controlled (HCC)   HTN (hypertension)   Elevated troponin   Pulmonary edema   Hypercholesteremia   Atrial fibrillation    COPD (chronic obstructive pulmonary disease) (HCC)   Acute encephalopathy   Hyponatremia   CAP (community acquired pneumonia)   Acute respiratory failure with hypoxia ([place])  Sepsis present on admission likely due to urinary source/UTI and possible pneumonia -Sepsis present on admission with lactic acid of 2.5, source is urinary, as well with under working diagnosis of multifocal pneumonia -Ceftin and azithromycin on admission, broadened to Vanco and cefepime, discontinue vancomycin given negative MRSA PCR -Follow urine cultures, blood cultures, will need to repeat chest x-ray once appropriately diuresed to focal opacity related to volume overload versus infection.  Acute Hypoxic respiratory failure -Quiring BiPAP initially, respiratory status has improved, she is on nasal cannula this morning. -Chest x-ray initially significant for volume overload, repeat x-ray this morning with evolving right upper lobe pneumonia, will need to repeat imaging after appropriate diuresis -Follow on Legionella, strep pneumonia  antigen  Hypokalemia/Hypophosphatemia/Hypomagnesemia -repleting , continue to monitor closely. -Monitor phosphorus closely for refeeding syndrome -Keep potassium> 4, magnesium> 2 due to A-fib with RVR  acute on chronic CHF, unspecified -2D echo is pending, but normal EF and echo 2015 -She will need IV diuresis when blood pressure has improved -Cardiology input greatly appreciated.  Hyponatremia -Continue to monitor closely  Normocytic anemia -B12 is borderline low, will start on IM supplements.  Acute metabolic encephalopathy -Likely due to infection -No focal deficits -Remains with significant dysphagia, failed swallow evaluation today, will start NGT for tube feed.  Diabetes mellitus, type II, poorly controlled with hyperglycemia -Hypoglycemic on presentation, will start on tube feed.   A-fib with RVR -Heart rate uncontrolled, initially on Cardizem, but blood pressure has been soft so she has been transitioned to amiodarone drip -She is on heparin drip given INR subtherapeutic - will add prn metoprolol for uncontrolled heart rate  Hypertension -Blood pressure is soft, hold meds  Hyperlipidemia -Resume statin when able to take oral  Dementia -Continue with supportive care        DVT prophylaxis: on warfarin /Avelox till she is able to take warfarin. Code Status: DNR Family Communication: Discussed with husband at bedside today. Disposition:   Status is: Inpatient Remains inpatient appropriate because: respirstory failure, chf and sepsis   Consultants:  Cardiolgoy  Subjective:  No significant events overnight as discussed with staff, patient failed follow evaluation this morning, she remains NPO.    Objective: Vitals:   12/10/21 0829 12/10/21 0834 12/10/21 0916 12/10/21 1132  BP:   (!) 152/60 (!) 161/62  Pulse:  (!) 112 (!) 102 99  Resp:  (!) 24 20 (!) 22  Temp:   99.3 F (37.4 C)  TempSrc:   Axillary   SpO2: 97% 92% 92% 90%  Weight:       Height:        Intake/Output Summary (Last 24 hours) at 12/10/2021 1404 Last data filed at 12/10/2021 0300 Gross per 24 hour  Intake 829.34 ml  Output 400 ml  Net 429.34 ml   Filed Weights   12/09/21 0800  Weight: 60.8 kg    Examination:   She is more awake and communicative today, but remains frail, deconditioned, ill-appearing.   Symmetrical Chest wall movement, Good air movement bilaterally, scattered rhonchi, mildly tachypneic irregular,No Gallops,Rubs or new Murmurs, No Parasternal Heave +ve B.Sounds, Abd Soft, No tenderness, No rebound - guarding or rigidity. No Cyanosis, Clubbing or edema, No new Rash or bruise      Data Reviewed: I have personally reviewed following labs and imaging studies  CBC: Recent Labs  Lab 12/03/2021 1622 12/30/2021 1638 01/01/2022 2259 12/09/2021 2300 12/09/21 0224 12/09/21 0810 12/10/21 0643  WBC 13.1*  --   --  8.2 8.5  --  7.4  NEUTROABS 11.1*  --   --  6.4 6.4  --   --   HGB 11.6*   < > 10.5* 9.0* 11.2* 10.2* 10.8*  HCT 38.0   < > 31.0* 28.7* 34.8* 30.0* 33.5*  MCV 87.2  --   --  85.7 85.5  --  83.3  PLT PLATELET CLUMPS NOTED ON SMEAR, COUNT APPEARS ADEQUATE  --   --  150 145*  --  162   < > = values in this interval not displayed.    Basic Metabolic Panel: Recent Labs  Lab 12/28/2021 1622 12/12/2021 1638 12/29/2021 2259 12/07/2021 2300 12/09/21 0204 12/09/21 0810 12/09/21 1811 12/10/21 0519  NA 130*   < > 133*  --  130* 128* 128* 127*  K 3.8   < > 2.6*  --  2.7* 3.3* 4.7 3.9  CL 94*  --   --   --  94*  --  95* 96*  CO2 23  --   --   --  22  --  19* 20*  GLUCOSE 236*  --   --   --  143*  --  76 73  BUN 12  --   --   --  11  --  11 10  CREATININE 0.82  --   --   --  0.86  --  0.78 0.76  CALCIUM 8.1*  --   --   --  7.8*  --  7.8* 7.5*  MG  --   --   --  1.5* 1.5*  --  1.9 2.3  PHOS  --   --   --  2.3* 2.4*  --  2.3* 4.1   < > = values in this interval not displayed.    GFR: Estimated Creatinine Clearance: 38.8 mL/min (by C-G  formula based on SCr of 0.76 mg/dL).  Liver Function Tests: Recent Labs  Lab 12/21/2021 1622 12/19/2021 2300 12/09/21 0204 12/10/21 0519  AST 48* 41 51* 59*  ALT '19 15 16 19  '$ ALKPHOS 60 46 46 43  BILITOT 0.7 0.5 0.5 0.4  PROT 6.9 5.6* 6.0* 5.9*  ALBUMIN 3.5 2.6* 3.0* 2.7*    CBG: Recent Labs  Lab 12/09/21 2146 12/10/21 0139 12/10/21 0331 12/10/21 0805 12/10/21 1144  GLUCAP 79 84 84 78 74     Recent Results (from the past 240 hour(s))  Blood Cultures (routine x 2)     Status:  None (Preliminary result)   Collection Time: 01/02/2022  2:35 PM   Specimen: BLOOD  Result Value Ref Range Status   Specimen Description BLOOD LEFT ANTECUBITAL  Final   Special Requests   Final    BOTTLES DRAWN AEROBIC AND ANAEROBIC Blood Culture results may not be optimal due to an inadequate volume of blood received in culture bottles   Culture   Final    NO GROWTH 2 DAYS Performed at [facility], [place] [address] [place], [place] [address]    Report Status PENDING  Incomplete  Blood Cultures (routine x 2)     Status: Abnormal (Preliminary result)   Collection Time: 01/01/2022  2:40 PM   Specimen: BLOOD LEFT FOREARM  Result Value Ref Range Status   Specimen Description BLOOD LEFT FOREARM  Final   Special Requests   Final    BOTTLES DRAWN AEROBIC AND ANAEROBIC Blood Culture adequate volume   Culture  Setup Time   Final    GRAM POSITIVE COCCI AEROBIC BOTTLE ONLY CRITICAL RESULT CALLED TO, READ BACK BY AND VERIFIED WITH: PHARMD T. DANG [id number] '@1729'$  FH    Culture (A)  Final    STAPHYLOCOCCUS EPIDERMIDIS THE SIGNIFICANCE OF ISOLATING THIS ORGANISM FROM A SINGLE SET OF BLOOD CULTURES WHEN MULTIPLE SETS ARE DRAWN IS UNCERTAIN. PLEASE NOTIFY THE MICROBIOLOGY DEPARTMENT WITHIN ONE WEEK IF SPECIATION AND SENSITIVITIES ARE REQUIRED. Performed at [facility], [place] [address] [place],  [address]    Report Status PENDING  Incomplete  Blood Culture ID Panel (Reflexed)     Status:  Abnormal   Collection Time: 12/30/2021  2:40 PM  Result Value Ref Range Status   Enterococcus faecalis NOT DETECTED NOT DETECTED Final   Enterococcus Faecium NOT DETECTED NOT DETECTED Final   Listeria monocytogenes NOT DETECTED NOT DETECTED Final   Staphylococcus species DETECTED (A) NOT DETECTED Final    Comment: CRITICAL RESULT CALLED TO, READ BACK BY AND VERIFIED WITH: PHARMD T. DANG [id number] '@1729'$  FH    Staphylococcus aureus (BCID) NOT DETECTED NOT DETECTED Final   Staphylococcus epidermidis DETECTED (A) NOT DETECTED Final    Comment: Methicillin (oxacillin) resistant coagulase negative staphylococcus. Possible blood culture contaminant (unless isolated from more than one blood culture draw or clinical case suggests pathogenicity). No antibiotic treatment is indicated for blood  culture contaminants. CRITICAL RESULT CALLED TO, READ BACK BY AND VERIFIED WITH: PHARMD T. DANG [id number] '@1729'$  FH    Staphylococcus lugdunensis NOT DETECTED NOT DETECTED Final   Streptococcus species NOT DETECTED NOT DETECTED Final   Streptococcus agalactiae NOT DETECTED NOT DETECTED Final   Streptococcus pneumoniae NOT DETECTED NOT DETECTED Final   Streptococcus pyogenes NOT DETECTED NOT DETECTED Final   A.calcoaceticus-baumannii NOT DETECTED NOT DETECTED Final   Bacteroides fragilis NOT DETECTED NOT DETECTED Final   Enterobacterales NOT DETECTED NOT DETECTED Final   Enterobacter cloacae complex NOT DETECTED NOT DETECTED Final   Escherichia coli NOT DETECTED NOT DETECTED Final   Klebsiella aerogenes NOT DETECTED NOT DETECTED Final   Klebsiella oxytoca NOT DETECTED NOT DETECTED Final   Klebsiella pneumoniae NOT DETECTED NOT DETECTED Final   Proteus species NOT DETECTED NOT DETECTED Final   Salmonella species NOT DETECTED NOT DETECTED Final   Serratia marcescens NOT DETECTED NOT DETECTED Final   Haemophilus influenzae NOT DETECTED NOT DETECTED Final   Neisseria meningitidis NOT DETECTED NOT DETECTED Final    Pseudomonas aeruginosa NOT DETECTED NOT DETECTED Final   Stenotrophomonas maltophilia NOT DETECTED NOT DETECTED Final  Candida albicans NOT DETECTED NOT DETECTED Final   Candida auris NOT DETECTED NOT DETECTED Final   Candida glabrata NOT DETECTED NOT DETECTED Final   Candida krusei NOT DETECTED NOT DETECTED Final   Candida parapsilosis NOT DETECTED NOT DETECTED Final   Candida tropicalis NOT DETECTED NOT DETECTED Final   Cryptococcus neoformans/gattii NOT DETECTED NOT DETECTED Final   Methicillin resistance mecA/C DETECTED (A) NOT DETECTED Final    Comment: CRITICAL RESULT CALLED TO, READ BACK BY AND VERIFIED WITH: PHARMD T. Mina Marble [id number] '@1729'$  FH Performed at [facility] [address] [place], [place] [address]   Resp Panel by RT-PCR (Flu A&B, Covid) Peripheral     Status: None   Collection Time: 12/16/2021  6:22 PM   Specimen: Peripheral; Nasopharyngeal(NP) swabs in vial transport medium  Result Value Ref Range Status   SARS Coronavirus 2 by RT PCR NEGATIVE NEGATIVE Final    Comment: (NOTE) SARS-CoV-2 target nucleic acids are NOT DETECTED.  The SARS-CoV-2 RNA is generally detectable in upper respiratory specimens during the acute phase of infection. The lowest concentration of SARS-CoV-2 viral copies this assay can detect is 138 copies/mL. A negative result does not preclude SARS-Cov-2 infection and should not be used as the sole basis for treatment or other patient management decisions. A negative result may occur with  improper specimen collection/handling, submission of specimen other than nasopharyngeal swab, presence of viral mutation(s) within the areas targeted by this assay, and inadequate number of viral copies(<138 copies/mL). A negative result must be combined with clinical observations, patient history, and epidemiological information. The expected result is Negative.  Fact Sheet for Patients:  EntrepreneurPulse.com.au  Fact Sheet for  Healthcare Providers:  IncredibleEmployment.be  This test is no t yet approved or cleared by the [place] FDA and  has been authorized for detection and/or diagnosis of SARS-CoV-2 by FDA under an Emergency Use Authorization (EUA). This EUA will remain  in effect (meaning this test can be used) for the duration of the COVID-19 declaration under Section 564(b)(1) of the Act, 21 U.S.C.section 360bbb-3(b)(1), unless the authorization is terminated  or revoked sooner.       Influenza A by PCR NEGATIVE NEGATIVE Final   Influenza B by PCR NEGATIVE NEGATIVE Final    Comment: (NOTE) The Xpert Xpress SARS-CoV-2/FLU/RSV plus assay is intended as an aid in the diagnosis of influenza from Nasopharyngeal swab specimens and should not be used as a sole basis for treatment. Nasal washings and aspirates are unacceptable for Xpert Xpress SARS-CoV-2/FLU/RSV testing.  Fact Sheet for Patients: EntrepreneurPulse.com.au  Fact Sheet for Healthcare Providers: IncredibleEmployment.be  This test is not yet approved or cleared by the [place] FDA and has been authorized for detection and/or diagnosis of SARS-CoV-2 by FDA under an Emergency Use Authorization (EUA). This EUA will remain in effect (meaning this test can be used) for the duration of the COVID-19 declaration under Section 564(b)(1) of the Act, 21 U.S.C. section 360bbb-3(b)(1), unless the authorization is terminated or revoked.  Performed at [facility], [place] [address] [place],  [address]   Respiratory (~20 pathogens) panel by PCR     Status: Abnormal   Collection Time: 12/12/2021  6:22 PM   Specimen: Nasopharyngeal Swab; Respiratory  Result Value Ref Range Status   Adenovirus NOT DETECTED NOT DETECTED Final   Coronavirus 229E NOT DETECTED NOT DETECTED Final    Comment: (NOTE) The Coronavirus on the Respiratory Panel, DOES NOT test for the novel  Coronavirus  (2019  nCoV)    Coronavirus HKU1 NOT DETECTED NOT DETECTED Final   Coronavirus NL63 NOT DETECTED NOT DETECTED Final   Coronavirus OC43 NOT DETECTED NOT DETECTED Final   Metapneumovirus DETECTED (A) NOT DETECTED Final   Rhinovirus / Enterovirus NOT DETECTED NOT DETECTED Final   Influenza A NOT DETECTED NOT DETECTED Final   Influenza B NOT DETECTED NOT DETECTED Final   Parainfluenza Virus 1 NOT DETECTED NOT DETECTED Final   Parainfluenza Virus 2 NOT DETECTED NOT DETECTED Final   Parainfluenza Virus 3 NOT DETECTED NOT DETECTED Final   Parainfluenza Virus 4 NOT DETECTED NOT DETECTED Final   Respiratory Syncytial Virus NOT DETECTED NOT DETECTED Final   Bordetella pertussis NOT DETECTED NOT DETECTED Final   Bordetella Parapertussis NOT DETECTED NOT DETECTED Final   Chlamydophila pneumoniae NOT DETECTED NOT DETECTED Final   Mycoplasma pneumoniae NOT DETECTED NOT DETECTED Final    Comment: Performed at [facility], [place] [address] [place], [place] [address]  Urine Culture     Status: Abnormal (Preliminary result)   Collection Time: 12/24/2021  6:48 PM   Specimen: In/Out Cath Urine  Result Value Ref Range Status   Specimen Description IN/OUT CATH URINE  Final   Special Requests Normal  Final   Culture (A)  Final    >=100,000 COLONIES/mL ESCHERICHIA COLI SUSCEPTIBILITIES TO FOLLOW Performed at [facility], [place] [address] [place], [place] [address]    Report Status PENDING  Incomplete  MRSA Next Gen by PCR, Nasal     Status: None   Collection Time: 12/09/21  7:39 PM   Specimen: Nasal Mucosa; Nasal Swab  Result Value Ref Range Status   MRSA by PCR Next Gen NOT DETECTED NOT DETECTED Final    Comment: (NOTE) The GeneXpert MRSA Assay (FDA approved for NASAL specimens only), is one component of a comprehensive MRSA colonization surveillance program. It is not intended to diagnose MRSA infection nor to guide or monitor treatment for MRSA infections. Test performance is not FDA  approved in patients less than 77 years old. Performed at [facility], [place] [address], [place], [place] [address]          Radiology Studies: DG Chest 1 View  Result Date: 12/31/2021 CLINICAL DATA:  Altered mental status. EXAM: CHEST  1 VIEW COMPARISON:  Prior chest radiographs 11/25/2018. FINDINGS: Heart size at the upper limits of normal, unchanged. Aortic atherosclerosis. Mild bilateral interstitial prominence suggesting interstitial edema. No definite airspace consolidation. No evidence of pleural effusion or pneumothorax. No acute bony abnormality identified. IMPRESSION: Prominence of the bilateral interstitial lung markings, suggesting interstitial edema. Atypical/viral pneumonia can also have this appearance and clinical correlation is recommended. Aortic Atherosclerosis (ICD10-I70.0). Redemonstrated prominent mitral annular calcifications. Electronically Signed   By: Kellie Simmering D.O.   On: 12/27/2021 15:12   CT HEAD WO CONTRAST  Result Date: 12/07/2021 CLINICAL DATA:  Mental status change, unknown cause. Additional history provided: Generalized weakness, altered mental status, fall. EXAM: CT HEAD WITHOUT CONTRAST TECHNIQUE: Contiguous axial images were obtained from the base of the skull through the vertex without intravenous contrast. RADIATION DOSE REDUCTION: This exam was performed according to the departmental dose-optimization program which includes automated exposure control, adjustment of the mA and/or kV according to patient size and/or use of iterative reconstruction technique. COMPARISON:  Prior head CT examinations 11/26/2018 and earlier. Brain MRI 05/16/2018. FINDINGS: Brain: Moderate generalized cerebral atrophy. Comparatively mild cerebellar atrophy. Mild patchy and ill-defined hypoattenuation within the cerebral white matter, nonspecific  but compatible with chronic small vessel ischemic disease. There is no acute intracranial hemorrhage. No demarcated cortical infarct.  No extra-axial fluid collection. No evidence of an intracranial mass. No midline shift. Vascular: No hyperdense vessel.  Atherosclerotic calcifications. Skull: Normal. Negative for fracture or focal lesion. Sinuses/Orbits: Visualized orbits show no acute finding. Mild mucosal thickening within the bilateral frontoethmoidal recesses. Mild mucosal thickening and fluid within the bilateral ethmoid sinuses. Mild mucosal thickening within the bilateral sphenoid sinuses. Trace mucosal thickening within the left maxillary sinus at the imaged levels. Other: Subtle frontoparietal scalp soft tissue swelling (series 5, image 30). IMPRESSION: No evidence of acute intracranial abnormality. Subtle frontoparietal scalp soft tissue swelling slightly eccentric to the right. Mild chronic small vessel image changes within the cerebral white matter, stable from the prior head CT of 11/26/2018. Moderate generalized cerebral atrophy. Comparatively mild cerebellar atrophy. Paranasal sinus disease at the imaged levels, as described. Electronically Signed   By: Kellie Simmering D.O.   On: 12/31/2021 15:25   DG CHEST PORT 1 VIEW  Result Date: 12/10/2021 CLINICAL DATA:  PICC placement. EXAM: PORTABLE CHEST 1 VIEW COMPARISON:  12/09/2021 and CT chest 08/06/2003. FINDINGS: Patient is rotated. Right PICC tip is in the low SVC or at the SVC RA junction. Heart size is grossly stable. Mixed interstitial and airspace opacification bilaterally. Focal airspace consolidation in the right perihilar region, as on 12/09/2021. Possible tiny left pleural effusion. IMPRESSION: 1. Right PICC placement with tip in the low SVC or at the SVC RA junction. 2. Pulmonary edema. 3. Right perihilar airspace consolidation, as on 12/09/2021, possibly due to pneumonia. Followup PA and lateral chest X-ray is recommended in 3-4 weeks following trial of antibiotic therapy to ensure resolution and exclude underlying malignancy. Electronically Signed   By: Lorin Picket M.D.    On: 12/10/2021 13:06   DG Chest Port 1 View  Result Date: 12/09/2021 CLINICAL DATA:  88 year old female with history of dyspnea. EXAM: PORTABLE CHEST 1 VIEW COMPARISON:  Chest x-ray 12/19/2021. FINDINGS: Lung volumes are low. Diffuse hazy ill-defined opacities and diffuse interstitial prominence noted throughout the lungs bilaterally. Diffuse peribronchial cuffing. Ill-defined somewhat nodular opacity in the right mid lung, new compared to the prior study, presumably a focus of developing infection. No definite pleural effusions. Mild cardiomegaly. The patient is rotated to the right on today's exam, resulting in distortion of the mediastinal contours and reduced diagnostic sensitivity and specificity for mediastinal pathology. Atherosclerotic calcifications in the thoracic aorta. IMPRESSION: 1. Findings are concerning for severe bronchitis, likely with developing right upper lobe pneumonia, as above. 2. Aortic atherosclerosis. 3. Mild cardiomegaly. Electronically Signed   By: Vinnie Langton M.D.   On: 12/09/2021 08:23   ECHOCARDIOGRAM COMPLETE  Result Date: 12/09/2021    ECHOCARDIOGRAM REPORT   Patient Name:   LAURALEI CLOUSE Date of Exam: 12/09/2021 Medical Rec #:  [id number]          Height:       60.0 in Accession #:    [id number]         Weight:       134.0 lb Date of Birth:  1929-11-30          BSA:          1.574 m Patient Age:    9 years           BP:           129/69 mmHg Patient Gender: F  HR:           85 bpm. Exam Location:  Inpatient Procedure: 2D Echo Indications:    Dyspnea  History:        Patient has prior history of Echocardiogram examinations, most                 recent 11/19/2011. Mitral Valve Disease, Arrythmias:Atrial                 Fibrillation; Risk Factors:Hypertension and Diabetes.  Sonographer:    Arlyss Gandy Referring Phys: [place]  1. Left ventricular ejection fraction, by estimation, is 60 to 65%. The left ventricle has normal  function. The left ventricle has no regional wall motion abnormalities. Left ventricular diastolic function could not be evaluated.  2. Right ventricular systolic function is normal. The right ventricular size is normal. There is normal pulmonary artery systolic pressure. The estimated right ventricular systolic pressure is 34.1 mmHg.  3. Left atrial size was mildly dilated.  4. The mitral valve is abnormal. Mild mitral valve regurgitation.  5. The aortic valve is tricuspid. Aortic valve regurgitation is not visualized. Aortic valve sclerosis is present, with no evidence of aortic valve stenosis. Aortic valve mean gradient measures 3.0 mmHg.  6. The inferior vena cava is normal in size with greater than 50% respiratory variability, suggesting right atrial pressure of 3 mmHg. FINDINGS  Left Ventricle: Left ventricular ejection fraction, by estimation, is 60 to 65%. The left ventricle has normal function. The left ventricle has no regional wall motion abnormalities. The left ventricular internal cavity size was normal in size. There is  no left ventricular hypertrophy. Left ventricular diastolic function could not be evaluated due to atrial fibrillation. Left ventricular diastolic function could not be evaluated. Right Ventricle: The right ventricular size is normal. No increase in right ventricular wall thickness. Right ventricular systolic function is normal. There is normal pulmonary artery systolic pressure. The tricuspid regurgitant velocity is 2.38 m/s, and  with an assumed right atrial pressure of 3 mmHg, the estimated right ventricular systolic pressure is 96.2 mmHg. Left Atrium: Left atrial size was mildly dilated. Right Atrium: Right atrial size was normal in size. Pericardium: There is no evidence of pericardial effusion. Mitral Valve: The mitral valve is abnormal. There is mild calcification of the posterior mitral valve leaflet(s). Mild to moderate mitral annular calcification. Mild mitral valve  regurgitation. MV peak gradient, 7.1 mmHg. The mean mitral valve gradient is 3.0 mmHg. Tricuspid Valve: The tricuspid valve is grossly normal. Tricuspid valve regurgitation is trivial. Aortic Valve: The aortic valve is tricuspid. Aortic valve regurgitation is not visualized. Aortic valve sclerosis is present, with no evidence of aortic valve stenosis. Aortic valve mean gradient measures 3.0 mmHg. Aortic valve peak gradient measures 5.8  mmHg. Aortic valve area, by VTI measures 2.91 cm. Pulmonic Valve: The pulmonic valve was normal in structure. Pulmonic valve regurgitation is not visualized. Aorta: The aortic root and ascending aorta are structurally normal, with no evidence of dilitation. Venous: The inferior vena cava is normal in size with greater than 50% respiratory variability, suggesting right atrial pressure of 3 mmHg. IAS/Shunts: No atrial level shunt detected by color flow Doppler.  LEFT VENTRICLE PLAX 2D LVIDd:         3.20 cm   Diastology LVIDs:         2.30 cm   LV e' medial:    8.70 cm/s LV PW:         0.80 cm  LV E/e' medial:  12.1 LV IVS:        0.80 cm   LV e' lateral:   9.57 cm/s LVOT diam:     2.00 cm   LV E/e' lateral: 11.0 LV SV:         55 LV SV Index:   35 LVOT Area:     3.14 cm  RIGHT VENTRICLE            IVC RV Basal diam:  2.80 cm    IVC diam: 2.00 cm RV Mid diam:    2.00 cm RV S prime:     9.14 cm/s TAPSE (M-mode): 1.6 cm LEFT ATRIUM             Index        RIGHT ATRIUM           Index LA diam:        4.30 cm 2.73 cm/m   RA Area:     12.10 cm LA Vol (A2C):   59.9 ml 38.04 ml/m  RA Volume:   24.20 ml  15.37 ml/m LA Vol (A4C):   51.5 ml 32.71 ml/m LA Biplane Vol: 55.5 ml 35.25 ml/m  AORTIC VALVE AV Area (Vmax):    2.67 cm AV Area (Vmean):   2.83 cm AV Area (VTI):     2.91 cm AV Vmax:           120.00 cm/s AV Vmean:          79.700 cm/s AV VTI:            0.189 m AV Peak Grad:      5.8 mmHg AV Mean Grad:      3.0 mmHg LVOT Vmax:         102.00 cm/s LVOT Vmean:        71.900 cm/s  LVOT VTI:          0.175 m LVOT/AV VTI ratio: 0.93  AORTA Ao Root diam: 2.80 cm Ao Asc diam:  3.20 cm MITRAL VALVE                TRICUSPID VALVE MV Area (PHT): 3.27 cm     TR Peak grad:   22.7 mmHg MV Area VTI:   1.93 cm     TR Vmax:        238.00 cm/s MV Peak grad:  7.1 mmHg MV Mean grad:  3.0 mmHg     SHUNTS MV Vmax:       1.33 m/s     Systemic VTI:  0.18 m MV Vmean:      74.2 cm/s    Systemic Diam: 2.00 cm MV Decel Time: 232 msec MV E velocity: 105.00 cm/s MV A velocity: 34.30 cm/s MV E/A ratio:  3.06 Lyman Bishop MD Electronically signed by Lyman Bishop MD Signature Date/Time: 12/09/2021/4:54:38 PM    Final    DG Hip Unilat W or Wo Pelvis 2-3 Views Right  Result Date: 12/15/2021 CLINICAL DATA:  Fall EXAM: DG HIP (WITH OR WITHOUT PELVIS) 2-3V RIGHT COMPARISON:  None. FINDINGS: Status post left hip hemiarthroplasty with no evidence of hardware fracture or loosening. No evidence of left hip dislocation on this single frontal view. No pelvic fracture or diastasis. No right hip fracture or dislocation. Diffuse osteopenia. No aggressive appearing focal osseous lesions. Mild right hip osteoarthritis. Degenerative changes in the visualized lower lumbar spine. IMPRESSION: No osseous fracture. No right hip dislocation. Status post left hip hemiarthroplasty without evidence of  hardware complication. Diffuse osteopenia. Electronically Signed   By: Ilona Sorrel M.D.   On: 12/06/2021 17:10   VAS [place] LOWER EXTREMITY VENOUS (DVT)  Result Date: 12/09/2021  Lower Venous DVT Study Patient Name:  TYNIAH KASTENS  Date of Exam:   12/09/2021 Medical Rec #: [id number]           Accession #:    [id number] Date of Birth: 05-31-1930           Patient Gender: F Patient Age:   70 years Exam Location:  [facility] Procedure:      VAS [place] LOWER EXTREMITY VENOUS (DVT) Referring Phys: Nyoka Lint DOUTOVA --------------------------------------------------------------------------------  Indications: Edema.  Limitations: Patient  contracted, patient positioning. Comparison Study: no prior Performing Technologist: Archie Patten RVS  Examination Guidelines: A complete evaluation includes B-mode imaging, spectral Doppler, color Doppler, and power Doppler as needed of all accessible portions of each vessel. Bilateral testing is considered an integral part of a complete examination. Limited examinations for reoccurring indications may be performed as noted. The reflux portion of the exam is performed with the patient in reverse Trendelenburg.  +-----+---------------+---------+-----------+----------+--------------+  RIGHT Compressibility Phasicity Spontaneity Properties Thrombus Aging  +-----+---------------+---------+-----------+----------+--------------+  CFV   Full            Yes       Yes                                    +-----+---------------+---------+-----------+----------+--------------+   +---------+---------------+---------+-----------+----------+-------------------+  LEFT      Compressibility Phasicity Spontaneity Properties Thrombus Aging       +---------+---------------+---------+-----------+----------+-------------------+  CFV       Full            Yes       Yes                                         +---------+---------------+---------+-----------+----------+-------------------+  SFJ       Full                                                                  +---------+---------------+---------+-----------+----------+-------------------+  FV Prox   Full                                                                  +---------+---------------+---------+-----------+----------+-------------------+  FV Mid                    Yes       Yes                                         +---------+---------------+---------+-----------+----------+-------------------+  FV Distal                 Yes  Yes                                         +---------+---------------+---------+-----------+----------+-------------------+  PFV        Full                                                                  +---------+---------------+---------+-----------+----------+-------------------+  POP       Full            Yes       Yes                                         +---------+---------------+---------+-----------+----------+-------------------+  PTV                       Yes       Yes                                         +---------+---------------+---------+-----------+----------+-------------------+  PERO                                                       Not well visualized  +---------+---------------+---------+-----------+----------+-------------------+    Summary: RIGHT: - No evidence of common femoral vein obstruction.  LEFT: - There is no evidence of deep vein thrombosis in the lower extremity. However, portions of this examination were limited- see technologist comments above.  - No cystic structure found in the popliteal fossa.  *See table(s) above for measurements and observations. Electronically signed by Deitra Mayo MD on 12/09/2021 at 5:06:52 PM.    Final    [place] EKG SITE RITE  Result Date: 12/10/2021 If Site Rite image not attached, placement could not be confirmed due to current cardiac rhythm.       Scheduled Meds:  Chlorhexidine Gluconate Cloth  6 each Topical Daily   [START ON 12/11/2021] cyanocobalamin  1,000 mcg Subcutaneous Daily   Followed by   Derrill Memo ON 12/17/2021] cyanocobalamin  1,000 mcg Subcutaneous Weekly   ipratropium-albuterol  3 mL Nebulization BID   warfarin  2 mg Oral ONCE-1600   warfarin  3 mg Oral ONCE-1600   Warfarin - Pharmacist Dosing Inpatient   Does not apply q1600   Continuous Infusions:  amiodarone 30 mg/hr (12/10/21 1338)   ceFEPime (MAXIPIME) IV 2 g (12/10/21 1006)     LOS: 2 days      Phillips Climes, MD [facility]   To contact the attending provider between 7A-7P or the covering provider during after hours 7P-7A, please log into the web site [email or web address]  and access using universal [place] password for that web site. If you do not have the password, please call the hospital operator.  12/10/2021, 2:04 PM

## 2021-12-10 NOTE — Progress Notes (Signed)
?  Transition of Care (TOC) Screening Note ? ? ?Patient Details  ?Name: [name] [name] ?Date of Birth: September 20, 1930 ? ? ?Transition of Care (TOC) CM/SW Contact:    ?Cyndi Bender, RN ?Phone Number: ?12/10/2021, 9:17 AM ? ? ? ?Transition of Care Department [facility]) has reviewed patient and no TOC needs have been identified at this time. We will continue to monitor patient advancement through interdisciplinary progression rounds. If new patient transition needs arise, please place a TOC consult. ? ? ?

## 2021-12-10 NOTE — Progress Notes (Signed)
SLP Cancellation Note ? ?Patient Details ?Name: [name] [name] ?MRN: [id number] ?DOB: [birth year] ? ? ?Cancelled treatment:       Reason Eval/Treat Not Completed: Patient's level of consciousness;Other (comment) (Patient unable to maintain adequate arousal. SLP plans to attempt to see patient this PM) ? ? ?Sonia Baller, MA, CCC-SLP ?Speech Therapy ? ?

## 2021-12-10 NOTE — Progress Notes (Signed)
?   12/09/21 2100  ?Assess: MEWS Score  ?Temp (!) 100.6 ?F (38.1 ?C)  ?BP 100/72  ?Pulse Rate (!) 115  ?ECG Heart Rate (!) 107  ?Resp (!) 24  ?SpO2 95 %  ?O2 Device Bi-PAP  ?O2 Flow Rate (L/min) 5 L/min  ?Assess: MEWS Score  ?MEWS Temp 1  ?MEWS Systolic 1  ?MEWS Pulse 1  ?MEWS RR 1  ?MEWS LOC 0  ?MEWS Score 4  ?MEWS Score Color Red  ?Assess: if the MEWS score is Yellow or Red  ?Were vital signs taken at a resting state? No  ?Focused Assessment No change from prior assessment  ?Early Detection of Sepsis Score *See Row Information* High  ?MEWS guidelines implemented *See Row Information* Yes  ?Treat  ?MEWS Interventions Administered scheduled meds/treatments  ?Pain Scale 0-10  ?Pain Score 0  ?Take Vital Signs  ?Increase Vital Sign Frequency  Red: Q 1hr X 4 then Q 4hr X 4, if remains red, continue Q 4hrs  ?Escalate  ?MEWS: Escalate Yellow: discuss with charge nurse/RN and consider discussing with provider and RRT  ?Notify: Charge Nurse/RN  ?Name of Charge Nurse/RN Notified Gerald Stabs, RN  ?Date Charge Nurse/RN Notified 12/10/21  ?Time Charge Nurse/RN Notified 0020  ?Document  ?Patient Outcome Stabilized after interventions  ? ? ?

## 2021-12-10 NOTE — Procedures (Signed)
Cortrak ? ?Person Inserting Tube:  Rosmarie Esquibel, Creola Corn, RD ?Tube Type:  Cortrak - 43 inches ?Tube Size:  10 ?Tube Location:  Left nare ?Initial Placement:  Stomach ?Secured by: Dorann Lodge ?Technique Used to Measure Tube Placement:  Marking at nare/corner of mouth ?Cortrak Secured At:  60 cm ? ?Cortrak Tube Team Note: ? ?Consult received to place a Cortrak feeding tube.  ? ?X-ray is required, abdominal x-ray has been ordered by the Cortrak team. Please confirm tube placement before using the Cortrak tube.  ? ?If the tube becomes dislodged please keep the tube and contact the Cortrak team at [email or web address] (password [email or web address]) for replacement.  ?If after hours and replacement cannot be delayed, place a NG tube and confirm placement with an abdominal x-ray.  ? ? ? ?Theone Stanley., MS, RD, LDN (she/her/hers) ?RD pager number and weekend/on-call pager number located in [place]. ? ? ?

## 2021-12-10 NOTE — Progress Notes (Signed)
Patient removed from BIPAP this AM and placed on 5L [place]. Patient is tolerating  well and appears to be more comfortable.  ?

## 2021-12-10 NOTE — Progress Notes (Signed)
Patient still off BIPAP at this time and tolerating  well. ?

## 2021-12-10 NOTE — Progress Notes (Signed)
Patient  admitted for sepsis, AMS, and fever. Patient is on continous bipap through the night.  Patient was restless with unstable MEWs score with elevated BP and HR.  Medication was given, and patient repositioned and made comfortable. Patient was able to rest with improved MEWs score. ?

## 2021-12-10 NOTE — Evaluation (Signed)
Physical Therapy Evaluation Patient Details Name: [name] [name] MRN: [id number] DOB: 1930-08-23 Today's Date: 12/10/2021  History of Present Illness  Pt is an 88 y.o. female who present to the ED with confusion and weakness folloing a fall at home. Admitted with a diagnosis of severe sepsis.  PMH:  DM2, CHF systolic diastolic chf, HDL, HTN, dementia, A.fib on coumadin   Clinical Impression  Pt admitted with above diagnosis. PTA pt lived at home with her husband, ambulating household distances with RW, on 4L O2 at baseline. Pt currently with functional limitations due to the deficits listed below (see PT Problem List). On eval, pt required +2 total assist bed mobility, and max assist sitting balance. Pt removed from bipap earlier this morning and on 5L O2 via [place] at time of eval. Mobility limited by respiratory status and fatigue. Pt easy to wake but falls asleep quickly without interaction/stimulation. HR into 120s sitting EOB and desat to 88%. At rest at end of session, SpO2 94% and HR 104. Pt will benefit from skilled PT to increase their independence and safety with mobility to allow discharge to the venue listed below.          Recommendations for follow up therapy are one component of a multi-disciplinary discharge planning process, led by the attending physician.  Recommendations may be updated based on patient status, additional functional criteria and insurance authorization.  Follow Up Recommendations Skilled nursing-short term rehab (<3 hours/day)    Assistance Recommended at Discharge Frequent or constant Supervision/Assistance  Patient can return home with the following       Equipment Recommendations None recommended by PT  Recommendations for Other Services       Functional Status Assessment Patient has had a recent decline in their functional status and demonstrates the ability to make significant improvements in function in a reasonable and predictable amount of time.      Precautions / Restrictions Precautions Precautions: Fall;Other (comment) Precaution Comments: monitor O2, HR (a fib) Restrictions Weight Bearing Restrictions: No      Mobility  Bed Mobility Overal bed mobility: Needs Assistance Bed Mobility: Rolling, Supine to Sit, Sit to Supine Rolling: Total assist   Supine to sit: Total assist, +2 for physical assistance Sit to supine: Total assist, +2 for physical assistance   General bed mobility comments: able to follow commands but poor initiation and poor activity tolerance. Upon sitting EOB, increased WOB noted. HR into 120s and desat to 88% on 5L. Returned to supine.    Transfers                   General transfer comment: deferred d/t poor endurance/balance    Ambulation/Gait               General Gait Details: unable  Stairs            Wheelchair Mobility    Modified Rankin (Stroke Patients Only)       Balance Overall balance assessment: Needs assistance Sitting-balance support: Bilateral upper extremity supported, Feet unsupported Sitting balance-Leahy Scale: Zero Sitting balance - Comments: Max A to maintain sitting balance EOB Postural control: Posterior lean                                   Pertinent Vitals/Pain Pain Assessment Pain Assessment: Faces Faces Pain Scale: Hurts little more Pain Location: "my joints" Pain Descriptors / Indicators: Grimacing, Guarding, Moaning Pain Intervention(s): Monitored  during session, Limited activity within patient's tolerance, Repositioned    Home Living Family/patient expects to be discharged to:: Private residence Living Arrangements: Spouse/significant other Available Help at Discharge: Family Type of Home: House Home Access: Level entry       [place]: [place]: Conservation officer, nature (2 wheels);Wheelchair Probation officer (4 wheels) Additional Comments: info obtained from previous admission as pt unable to  provide full home setup d/t AMS    Prior Function Prior Level of Function : Needs assist;Patient poor historian/Family not available             Mobility Comments: per pt, she is ambulatory with a walker including walking community distances. per previous admission, pt uses wheelchair outside of the home. Pt denies falls in 8 or 9 years  but noted fall on 3/5 per chart ADLs Comments: reports Mod I for ADLs but per chart from previous admission, husband assists with ADLs.Husband typically completes IADLs     Hand Dominance   Dominant Hand: Right    Extremity/Trunk Assessment   Upper Extremity Assessment Upper Extremity Assessment: Generalized weakness RUE Deficits / Details: edema around forearm, ROM WFL LUE Deficits / Details: edema around forearm, IV leaking, questionable infilltration - RN aware and present    Lower Extremity Assessment Lower Extremity Assessment: Generalized weakness    Cervical / Trunk Assessment Cervical / Trunk Assessment: Kyphotic  Communication   Communication: No difficulties  Cognition Arousal/Alertness: Awake/alert, Lethargic Behavior During Therapy: Flat affect, WFL for tasks assessed/performed Overall Cognitive Status: No family/caregiver present to determine baseline cognitive functioning Area of Impairment: Attention, Following commands, Memory, Safety/judgement, Awareness, Problem solving                   Current Attention Level: Sustained Memory: Decreased short-term memory Following Commands: Follows one step commands consistently Safety/Judgement: Decreased awareness of deficits Awareness: Intellectual Problem Solving: Slow processing, Difficulty sequencing, Requires verbal cues, Requires tactile cues General Comments: pt lethargic on entry but easily awakens to voice. with activity, alertness improved. consistently follows one step commands, joking with staff appropriately. poor recall of PLOF and noted memory deficits (reports  no falls in years but fell prior to this admission)        General Comments General comments (skin integrity, edema, etc.): At end of session at rest in supine, SpO2 93% on 5L, HR 104    Exercises     Assessment/Plan    PT Assessment Patient needs continued PT services  PT Problem List Decreased strength;Decreased mobility;Decreased activity tolerance;Cardiopulmonary status limiting activity;Pain;Decreased balance       PT Treatment Interventions Therapeutic activities;Gait training;Therapeutic exercise;Patient/family education;Balance training;Functional mobility training    PT Goals (Current goals can be found in the Care Plan section)  Acute Rehab PT Goals Patient Stated Goal: not stated PT Goal Formulation: Patient unable to participate in goal setting Time For Goal Achievement: 12/24/21 Potential to Achieve Goals: Fair    Frequency Min 2X/week     Co-evaluation PT/OT/SLP Co-Evaluation/Treatment: Yes Reason for Co-Treatment: Complexity of the patient's impairments (multi-system involvement);For patient/therapist safety;To address functional/ADL transfers PT goals addressed during session: Mobility/safety with mobility OT goals addressed during session: ADL's and self-care;Strengthening/ROM       AM-PAC PT "6 Clicks" Mobility  Outcome Measure Help needed turning from your back to your side while in a flat bed without using bedrails?: Total Help needed moving from lying on your back to sitting on the side of a flat bed without using bedrails?: Total Help needed  moving to and from a bed to a chair (including a wheelchair)?: Total Help needed standing up from a chair using your arms (e.g., wheelchair or bedside chair)?: Total Help needed to walk in hospital room?: Total Help needed climbing 3-5 steps with a railing? : Total 6 Click Score: 6    End of Session Equipment Utilized During Treatment: Oxygen Activity Tolerance: Treatment limited secondary to medical  complications (Comment);Patient limited by fatigue (desat) Patient left: in bed;with call bell/phone within reach;with bed alarm set Nurse Communication: Mobility status PT Visit Diagnosis: Other abnormalities of gait and mobility (R26.89);Muscle weakness (generalized) (M62.81)    Time: 2811-8867 PT Time Calculation (min) (ACUTE ONLY): 20 min   Charges:   PT Evaluation $PT Eval Moderate Complexity: 1 Mod          Lorrin Goodell, PT  Office # [phone] Pager [phone]   Lorriane Shire 12/10/2021, 12:01 PM

## 2021-12-11 DIAGNOSIS — G9340 Encephalopathy, unspecified: Secondary | ICD-10-CM | POA: Diagnosis not present

## 2021-12-11 DIAGNOSIS — J9601 Acute respiratory failure with hypoxia: Secondary | ICD-10-CM | POA: Diagnosis not present

## 2021-12-11 DIAGNOSIS — A419 Sepsis, unspecified organism: Secondary | ICD-10-CM | POA: Diagnosis not present

## 2021-12-11 DIAGNOSIS — I4891 Unspecified atrial fibrillation: Secondary | ICD-10-CM | POA: Diagnosis not present

## 2021-12-11 DIAGNOSIS — E440 Moderate protein-calorie malnutrition: Secondary | ICD-10-CM | POA: Insufficient documentation

## 2021-12-11 LAB — GLUCOSE, CAPILLARY
Glucose-Capillary: 151 mg/dL — ABNORMAL HIGH (ref 70–99)
Glucose-Capillary: 165 mg/dL — ABNORMAL HIGH (ref 70–99)
Glucose-Capillary: 203 mg/dL — ABNORMAL HIGH (ref 70–99)
Glucose-Capillary: 208 mg/dL — ABNORMAL HIGH (ref 70–99)
Glucose-Capillary: 221 mg/dL — ABNORMAL HIGH (ref 70–99)
Glucose-Capillary: 237 mg/dL — ABNORMAL HIGH (ref 70–99)
Glucose-Capillary: 248 mg/dL — ABNORMAL HIGH (ref 70–99)

## 2021-12-11 LAB — URINE CULTURE
Culture: >=100000 — AB
Special Requests: NORMAL

## 2021-12-11 LAB — CULTURE, BLOOD (ROUTINE X 2): Special Requests: ADEQUATE

## 2021-12-11 LAB — CBC
HCT: 32.3 % — ABNORMAL LOW (ref 36.0–46.0)
Hemoglobin: 10.5 g/dL — ABNORMAL LOW (ref 12.0–15.0)
MCH: 27.1 pg (ref 26.0–34.0)
MCHC: 32.5 g/dL (ref 30.0–36.0)
MCV: 83.5 fL (ref 80.0–100.0)
Platelets: 161 10*3/uL (ref 150–400)
RBC: 3.87 MIL/uL (ref 3.87–5.11)
RDW: 15.8 % — ABNORMAL HIGH (ref 11.5–15.5)
WBC: 7.8 10*3/uL (ref 4.0–10.5)
nRBC: 0 % (ref 0.0–0.2)

## 2021-12-11 LAB — BASIC METABOLIC PANEL
Anion gap: 7 (ref 5–15)
BUN: 15 mg/dL (ref 8–23)
CO2: 23 mmol/L (ref 22–32)
Calcium: 7.7 mg/dL — ABNORMAL LOW (ref 8.9–10.3)
Chloride: 100 mmol/L (ref 98–111)
Creatinine, Ser: 0.84 mg/dL (ref 0.44–1.00)
GFR, Estimated: >60 mL/min (ref >60)
Glucose, Bld: 184 mg/dL — ABNORMAL HIGH (ref 70–99)
Potassium: 3.7 mmol/L (ref 3.5–5.1)
Sodium: 130 mmol/L — ABNORMAL LOW (ref 135–145)

## 2021-12-11 LAB — MAGNESIUM
Magnesium: 2.3 mg/dL (ref 1.7–2.4)
Magnesium: 2.1 mg/dL (ref 1.7–2.4)

## 2021-12-11 LAB — PHOSPHORUS
Phosphorus: 2.3 mg/dL — ABNORMAL LOW (ref 2.5–4.6)
Phosphorus: 1.9 mg/dL — ABNORMAL LOW (ref 2.5–4.6)

## 2021-12-11 LAB — LEGIONELLA PNEUMOPHILA SEROGP 1 UR AG: L. pneumophila Serogp 1 Ur Ag: NEGATIVE

## 2021-12-11 LAB — PROTIME-INR
INR: 2.6 — ABNORMAL HIGH (ref 0.8–1.2)
Prothrombin Time: 27.7 seconds — ABNORMAL HIGH (ref 11.4–15.2)

## 2021-12-11 LAB — HEMOGLOBIN A1C
Hgb A1c MFr Bld: 6.7 % — ABNORMAL HIGH (ref 4.8–5.6)
Mean Plasma Glucose: 145.59 mg/dL

## 2021-12-11 MED ORDER — INSULIN GLARGINE-YFGN 100 UNIT/ML ~~LOC~~ SOLN
5.0000 [IU] | Freq: Every day | SUBCUTANEOUS | Status: DC
  Administered 2021-12-11 – 2021-12-13 (×3): 5 [IU] via SUBCUTANEOUS
  Filled 2021-12-11 (×3): qty 0.05

## 2021-12-11 MED ORDER — INSULIN ASPART 100 UNIT/ML IJ SOLN
0.0000 [IU] | INTRAMUSCULAR | Status: DC
  Administered 2021-12-11 (×3): 3 [IU] via SUBCUTANEOUS
  Administered 2021-12-12: 7 [IU] via SUBCUTANEOUS
  Administered 2021-12-12: 5 [IU] via SUBCUTANEOUS
  Administered 2021-12-12: 3 [IU] via SUBCUTANEOUS
  Administered 2021-12-12: 1 [IU] via SUBCUTANEOUS
  Administered 2021-12-12 (×2): 3 [IU] via SUBCUTANEOUS
  Administered 2021-12-13: 1 [IU] via SUBCUTANEOUS
  Administered 2021-12-13: 3 [IU] via SUBCUTANEOUS

## 2021-12-11 MED ORDER — INSULIN ASPART 100 UNIT/ML IJ SOLN
0.0000 [IU] | INTRAMUSCULAR | Status: DC
  Administered 2021-12-11 (×2): 5 [IU] via SUBCUTANEOUS

## 2021-12-11 MED ORDER — K PHOS MONO-SOD PHOS DI & MONO 155-852-130 MG PO TABS
500.0000 mg | ORAL_TABLET | Freq: Three times a day (TID) | ORAL | Status: AC
  Administered 2021-12-11 – 2021-12-12 (×6): 500 mg
  Filled 2021-12-11 (×6): qty 2

## 2021-12-11 MED ORDER — WARFARIN 1.25 MG HALF TABLET
1.2500 mg | ORAL_TABLET | Freq: Once | ORAL | Status: AC
  Administered 2021-12-11: 16:00:00 1.25 mg via ORAL
  Filled 2021-12-11: qty 1

## 2021-12-11 NOTE — Progress Notes (Signed)
Inpatient Diabetes Program Recommendations ? ?AACE/ADA: New Consensus Statement on Inpatient Glycemic Control (2015) ? ?Target Ranges:  Prepandial:   less than 140 mg/dL ?     Peak postprandial:   less than 180 mg/dL (1-2 hours) ?     Critically ill patients:  140 - 180 mg/dL  ? ?Lab Results  ?Component Value Date  ? GLUCAP 208 (H) 12/11/2021  ? HGBA1C 6.7 (H) 12/11/2021  ? ? ?Review of Glycemic Control ? Latest Reference Range & Units 12/10/21 19:51 12/11/21 00:19 12/11/21 04:15 12/11/21 08:11 12/11/21 12:18  ?Glucose-Capillary 70 - 99 mg/dL 151 (H) 151 (H) 165 (H) 248 (H) 208 (H)  ? ?Diabetes history: DM 2 ?Outpatient Diabetes medications:  ?Amaryl 4 mg daily ?Toujeo 18 units q HS ?Current orders for Inpatient glycemic control:  ?Novolog moderate q 4 hours ?Osmolite 1.2 cal- 65 ml/hr ?Inpatient Diabetes Program Recommendations:   ? ?Consider reducing Novolog correction to sensitive q 4 hours.  Also consider adding Semglee 5 units daily.  ? ?Thanks,  ?Adah Perl, RN, BC-ADM ?Inpatient Diabetes Coordinator ?Pager [phone]  (8a-5p) ? ? ? ?

## 2021-12-11 NOTE — Progress Notes (Signed)
ANTICOAGULATION CONSULT NOTE - Follow Up Consult ? ?Pharmacy Consult for Warfarin ?Indication: atrial fibrillation ? ?Allergies  ?Allergen Reactions  ? Ciprofloxacin Swelling  ?  Site of swelling not recalled  ? Lactose Intolerance (Gi) Nausea And Vomiting and Other (See Comments)  ?  severe stomach pain  ? Penicillins Hives and Itching  ?  "haven't used any since 1970's" - have gotten cephalosporins multiple times ?Has patient had a PCN reaction causing immediate rash, facial/tongue/throat swelling, SOB or lightheadedness with hypotension: Yes ?Has patient had a PCN reaction causing severe rash involving mucus membranes or skin necrosis: Unk ?Has patient had a PCN reaction that required hospitalization: Unk ?Has patient had a PCN reaction occurring within the last 10 years: No ?If all of the above answers are "NO", then may proceed with C  ? Sulfa Antibiotics Hives and Itching  ?  "haven't used any since 1970's"  ? Sulfasalazine Hives and Itching  ?  "haven't used any since 1970's"  ? Depakote [Divalproex Sodium] Other (See Comments)  ? Lisinopril Other (See Comments)  ?  Other reaction(s): associated with hyperkalemia  ? ? ?Patient Measurements: ?Height: 5' (152.4 cm) ?Weight: 60 kg (132 lb 4.4 oz) ?IBW/kg (Calculated) : 45.5 ? ?Vital Signs: ?Temp: 98.1 ?F (36.7 ?C) (03/09 1683) ?Temp Source: Axillary (03/09 0737) ?BP: 152/55 (03/09 0737) ?Pulse Rate: 97 (03/09 0737) ? ?Labs: ?Recent Labs  ?  12/09/2021 ?1622 12/14/2021 ?1638 12/30/2021 ?2300 12/09/21 ?0204 12/09/21 ?0224 12/09/21 ?0424 12/09/21 ?0810 12/09/21 ?1811 12/10/21 ?7290 12/10/21 ?2111 12/11/21 ?0239  ?HGB 11.6*   < > 9.0*  --  11.2*  --  10.2*  --   --  10.8* 10.5*  ?HCT 38.0   < > 28.7*  --  34.8*  --  30.0*  --   --  33.5* 32.3*  ?PLT PLATELET CLUMPS NOTED ON SMEAR, COUNT APPEARS ADEQUATE  --  150  --  145*  --   --   --   --  162 161  ?APTT 22*  --   --   --   --   --   --   --   --   --   --   ?LABPROT 16.9*  --   --   --  17.8*  --   --   --   --  24.7*  27.7*  ?INR 1.4*  --   --   --  1.5*  --   --   --   --  2.2* 2.6*  ?CREATININE 0.82  --   --  0.86  --   --   --  0.78 0.76  --  0.84  ?CKTOTAL  --   --  273*  --   --   --   --   --   --   --   --   ?TROPONINIHS  --    < > 73* 59* 50* 49*  --   --   --   --   --   ? < > = values in this interval not displayed.  ? ? ? ?Estimated Creatinine Clearance: 36.8 mL/min (by C-G formula based on SCr of 0.84 mg/dL). ? ?Assessment: ?32 yof with a history of DM2, CHF systolic diastolic chf, HDL, HTN, dementia. A.fib on coumadin. Patient is presenting with AMS. Warfarin per pharmacy consult placed for atrial fibrillation. INR 1.4 on admit 3/6 and Lovenox bridge was added while INR subtherapeutic. ? ?  INR 1.5>2.2 on 3/8 and Lovenox  stopped. INR 2.6 today after Warfarin 2 mg x 1 on 3/8, remains therapeutic. On amiodarone drip, which may increase sensitivity to warfarin doses. ? ?PTA warfarin regimen: 2.5 mg on TuTh and 1.25 mg all other days.  ? - Last dose PTA on 3/5.  ? ?Goal of Therapy:  ?INR 2-3 ?Monitor platelets by anticoagulation protocol: Yes ?  ?Plan:  ?Warfarin 1.25 mg x 1 today. ?Daily PT/INR ?Watch for increased sensitivity to warfarin doses while on Amiodarone. ? ?Arty Baumgartner, RPh ?12/11/2021,10:10 AM ? ? ?

## 2021-12-11 NOTE — Progress Notes (Signed)
HOSPITAL MEDICINE OVERNIGHT EVENT NOTE   ? ?Notified by nursing that patient is exhibiting increasing hypoglycemia since being initiated on tube feeds. ? ?Patient has a known history of diabetes with last hemoglobin A1c being 7.8% in 2022. ? ?Placing patient on Accu-Cheks every 4 hours with sliding scale insulin.  Repeating hemoglobin A1c. ? ?Vernelle Emerald  MD ?[facility]  ? ? ? ? ? ? ? ? ? ? ?

## 2021-12-11 NOTE — Progress Notes (Signed)
HOSPITAL MEDICINE OVERNIGHT EVENT NOTE   ? ?Nursing notified me earlier in the evening they were becoming increasingly concerned with patient's shortness of breath and tachypnea. ? ?Patient was complaining of shortness of breath and on nursing lung exam with a noted bibasilar rales with intermittent wheezing.  Of note, patient is currently on 5 L of oxygen via nasal cannula saturating 100%. ? ?Nursing proceeded to provide the patient with a nebulized breathing treatment with some significant improvement in symptoms. ? ?Per my follow-up with nursing patient is now looking better after the breathing treatment.  If patient begins to decline again we will consider working the patient up such as obtaining a stat chest x-ray at bedside evaluation. ? ?Vernelle Emerald  MD ?[facility]  ? ? ? ? ? ? ? ? ? ? ? ? ? ?

## 2021-12-11 NOTE — Progress Notes (Signed)
On assessment short of breath, tachypneic with respiratory rate 30-38, sat up in bed. Auscultation revealed fine crackles. Currently on 5 liters, she has been give a neub treatment by respiratory . Contacted on call provider to assess need for diuretic. Her BP has been apropriate all day , with current BP 155/67 , pulse 110 (afib ), SpO2-96.  ?

## 2021-12-11 NOTE — Progress Notes (Signed)
PROGRESS NOTE    [name] [name]  WFU:[id number] DOB: 04/23/1930 DOA: 12/07/2021 PCP: Donnajean Lopes, MD   Chief Complaint  Patient presents with   Altered Mental Status    Brief Narrative:   [name] [name] is a 88 y.o. female with medical history significant of    DM2, CHF systolic diastolic chf, HDL, HTN, dementia. A.fib on coumadin  ED secondary for generalized weakness, confusion, she was reported noted to be febrile, hypoglycemic, her work-up was significant for sepsis and volume overload, as well she is in respiratory distress where she required BiPAP. -She was treated with broad-spectrum antibiotic for sepsis, as well D5 for hypoglycemia, sepsis has improved, respiratory status has improved as well where she is currently off BiPAP, she is still having faculty swallowing where she was started on cortrak tube feed.   Assessment & Plan:   Principal Problem:   Severe sepsis ([place]) Active Problems:   UTI (urinary tract infection)   DM II (diabetes mellitus, type II), controlled (HCC)   HTN (hypertension)   Elevated troponin   Pulmonary edema   Hypercholesteremia   Atrial fibrillation    COPD (chronic obstructive pulmonary disease) (HCC)   Acute encephalopathy   Hyponatremia   CAP (community acquired pneumonia)   Acute respiratory failure with hypoxia (HCC)   Malnutrition of moderate degree  Sepsis present on admission likely due to urinary source/UTI and possible pneumonia -Sepsis present on admission with lactic acid of 2.5, source is urinary, as well with under working diagnosis of multifocal pneumonia -Ceftin and azithromycin on admission, broadened to Vanco and cefepime, discontinue vancomycin given negative MRSA PCR -1/4 blood culture growing staph epi, this is likely contamination, will monitor off vancomycin  Acute Hypoxic respiratory failure due to to pneumonia, likely viral pneumonia with superimposed bacterial pneumonia. -Quiring BiPAP initially,  respiratory status has improved, she is on nasal cannula this morning. -Chest x-ray initially significant for volume overload, repeat x-ray this morning with evolving right upper lobe pneumonia, will need to repeat imaging after appropriate diuresis -Follow on Legionella, strep pneumonia antigen -Respiratory panel growing metapneumovirus -Likely with superimposed bacterial pneumonia as well  Urinary tract infection -Urine cultures growing E. coli.  Hypokalemia/Hypophosphatemia/Hypomagnesemia -repleting , continue to monitor closely. -Monitor phosphorus closely for refeeding syndrome -Keep potassium> 4, magnesium> 2 due to A-fib with RVR  acute on chronic CHF, unspecified -2D echo is pending, but normal EF and echo 2015 -She will need IV diuresis when blood pressure has improved -Cardiology input greatly appreciated.  Hyponatremia -Improving  Normocytic anemia -B12 is borderline low,  started on IM supplements.  Acute metabolic encephalopathy -Likely due to infection -No focal deficits -Remains with significant dysphagia, failed swallow evaluation today, will start NGT for tube feed.  Diabetes mellitus, type II, poorly controlled with hyperglycemia -Hypoglycemic on presentation, solved with tube feeding, now blood sugar is elevated, will start on low-dose Semglee, continue with insulin every 4 hours sliding scale.   A-fib with RVR -Heart rate uncontrolled, initially on Cardizem, but blood pressure has been soft so she has been transitioned to amiodarone drip -Continue with home dose metoprolol -INR is therapeutic on warfarin.  Hypertension -Initially blood pressure is soft, this has improved, back on metoprolol.  Hyperlipidemia -Resume statin when able to take oral  Dementia -Continue with supportive care   Overall patient is frail, deconditioned, with dementia at baseline, palliative medicine consulted regarding goals of care if she does not improve or if she is  unable to pass swallow evaluation.  DVT prophylaxis: on warfarin  Code Status: DNR Family Communication: Discussed with husband at bedside today. Disposition:   Status is: Inpatient Remains inpatient appropriate because: respirstory failure, chf and sepsis   Consultants:  Cardiolgoy  Subjective:  No significant event as discussed overnight with staff, she denies any complaints today.  Objective: Vitals:   12/11/21 0309 12/11/21 0500 12/11/21 0737 12/11/21 1152  BP: (!) 140/52  (!) 152/55 132/66  Pulse: 91  97 87  Resp: 20  (!) 23 19  Temp: 98.5 F (36.9 C)  98.1 F (36.7 C) 98.7 F (37.1 C)  TempSrc: Axillary  Axillary Oral  SpO2: 94%  96% 95%  Weight:  60 kg    Height:        Intake/Output Summary (Last 24 hours) at 12/11/2021 1424 Last data filed at 12/11/2021 0902 Gross per 24 hour  Intake 260 ml  Output 275 ml  Net -15 ml   Filed Weights   12/09/21 0800 12/11/21 0500  Weight: 60.8 kg 60 kg    Examination:   Awake Alert, extremely frail, deconditioned and ill-appearing  symmetrical Chest wall movement, diminished air entry at the bases with tachypnea regular irregular, No Gallops,Rubs or new Murmurs, No Parasternal Heave +ve B.Sounds, Abd Soft, No tenderness, No rebound - guarding or rigidity. No Cyanosis, Clubbing or edema, No new Rash or bruise       Data Reviewed: I have personally reviewed following labs and imaging studies  CBC: Recent Labs  Lab 12/27/2021 1622 12/18/2021 1638 12/12/2021 2300 12/09/21 0224 12/09/21 0810 12/10/21 0643 12/11/21 0239  WBC 13.1*  --  8.2 8.5  --  7.4 7.8  NEUTROABS 11.1*  --  6.4 6.4  --   --   --   HGB 11.6*   < > 9.0* 11.2* 10.2* 10.8* 10.5*  HCT 38.0   < > 28.7* 34.8* 30.0* 33.5* 32.3*  MCV 87.2  --  85.7 85.5  --  83.3 83.5  PLT PLATELET CLUMPS NOTED ON SMEAR, COUNT APPEARS ADEQUATE  --  150 145*  --  162 161   < > = values in this interval not displayed.    Basic Metabolic Panel: Recent Labs  Lab  12/18/2021 1622 12/29/2021 1638 12/09/21 0204 12/09/21 0810 12/09/21 1811 12/10/21 0519 12/10/21 1709 12/11/21 0239  NA 130*   < > 130* 128* 128* 127*  --  130*  K 3.8   < > 2.7* 3.3* 4.7 3.9  --  3.7  CL 94*  --  94*  --  95* 96*  --  100  CO2 23  --  22  --  19* 20*  --  23  GLUCOSE 236*  --  143*  --  76 73  --  184*  BUN 12  --  11  --  11 10  --  15  CREATININE 0.82  --  0.86  --  0.78 0.76  --  0.84  CALCIUM 8.1*  --  7.8*  --  7.8* 7.5*  --  7.7*  MG  --    < > 1.5*  --  1.9 2.3 2.3 2.3  PHOS  --    < > 2.4*  --  2.3* 4.1 2.9 2.3*   < > = values in this interval not displayed.    GFR: Estimated Creatinine Clearance: 36.8 mL/min (by C-G formula based on SCr of 0.84 mg/dL).  Liver Function Tests: Recent Labs  Lab 12/29/2021 1622 12/25/2021 2300 12/09/21 0204 12/10/21 3474  AST 48* 41 51* 59*  ALT '19 15 16 19  '$ ALKPHOS 60 46 46 43  BILITOT 0.7 0.5 0.5 0.4  PROT 6.9 5.6* 6.0* 5.9*  ALBUMIN 3.5 2.6* 3.0* 2.7*    CBG: Recent Labs  Lab 12/10/21 1951 12/11/21 0019 12/11/21 0415 12/11/21 0811 12/11/21 1218  GLUCAP 151* 151* 165* 248* 208*     Recent Results (from the past 240 hour(s))  Blood Cultures (routine x 2)     Status: None (Preliminary result)   Collection Time: 12/04/2021  2:35 PM   Specimen: BLOOD  Result Value Ref Range Status   Specimen Description BLOOD LEFT ANTECUBITAL  Final   Special Requests   Final    BOTTLES DRAWN AEROBIC AND ANAEROBIC Blood Culture results may not be optimal due to an inadequate volume of blood received in culture bottles   Culture   Final    NO GROWTH 3 DAYS Performed at [facility], [place] [address], [place], [place] [address]    Report Status PENDING  Incomplete  Blood Cultures (routine x 2)     Status: Abnormal   Collection Time: 12/07/2021  2:40 PM   Specimen: BLOOD LEFT FOREARM  Result Value Ref Range Status   Specimen Description BLOOD LEFT FOREARM  Final   Special Requests   Final    BOTTLES DRAWN AEROBIC AND  ANAEROBIC Blood Culture adequate volume   Culture  Setup Time   Final    GRAM POSITIVE COCCI AEROBIC BOTTLE ONLY CRITICAL RESULT CALLED TO, READ BACK BY AND VERIFIED WITH: PHARMD T. DANG [id number] '@1729'$  FH    Culture (A)  Final    STAPHYLOCOCCUS EPIDERMIDIS STAPHYLOCOCCUS HAEMOLYTICUS THE SIGNIFICANCE OF ISOLATING THIS ORGANISM FROM A SINGLE SET OF BLOOD CULTURES WHEN MULTIPLE SETS ARE DRAWN IS UNCERTAIN. PLEASE NOTIFY THE MICROBIOLOGY DEPARTMENT WITHIN ONE WEEK IF SPECIATION AND SENSITIVITIES ARE REQUIRED. CRITICAL RESULT CALLED TO, READ BACK BY AND VERIFIED WITH: PHARMD EMMA U. [id number] FCP Performed at [facility], [place] [address] [place], [place] [address]    Report Status 12/11/2021 FINAL  Final  Blood Culture ID Panel (Reflexed)     Status: Abnormal   Collection Time: 01/01/2022  2:40 PM  Result Value Ref Range Status   Enterococcus faecalis NOT DETECTED NOT DETECTED Final   Enterococcus Faecium NOT DETECTED NOT DETECTED Final   Listeria monocytogenes NOT DETECTED NOT DETECTED Final   Staphylococcus species DETECTED (A) NOT DETECTED Final    Comment: CRITICAL RESULT CALLED TO, READ BACK BY AND VERIFIED WITH: PHARMD T. DANG [id number] '@1729'$  FH    Staphylococcus aureus (BCID) NOT DETECTED NOT DETECTED Final   Staphylococcus epidermidis DETECTED (A) NOT DETECTED Final    Comment: Methicillin (oxacillin) resistant coagulase negative staphylococcus. Possible blood culture contaminant (unless isolated from more than one blood culture draw or clinical case suggests pathogenicity). No antibiotic treatment is indicated for blood  culture contaminants. CRITICAL RESULT CALLED TO, READ BACK BY AND VERIFIED WITH: PHARMD T. DANG [id number] '@1729'$  FH    Staphylococcus lugdunensis NOT DETECTED NOT DETECTED Final   Streptococcus species NOT DETECTED NOT DETECTED Final   Streptococcus agalactiae NOT DETECTED NOT DETECTED Final   Streptococcus pneumoniae NOT DETECTED NOT DETECTED Final    Streptococcus pyogenes NOT DETECTED NOT DETECTED Final   A.calcoaceticus-baumannii NOT DETECTED NOT DETECTED Final   Bacteroides fragilis NOT DETECTED NOT DETECTED Final   Enterobacterales NOT DETECTED NOT DETECTED Final   Enterobacter cloacae complex NOT DETECTED NOT DETECTED Final  Escherichia coli NOT DETECTED NOT DETECTED Final   Klebsiella aerogenes NOT DETECTED NOT DETECTED Final   Klebsiella oxytoca NOT DETECTED NOT DETECTED Final   Klebsiella pneumoniae NOT DETECTED NOT DETECTED Final   Proteus species NOT DETECTED NOT DETECTED Final   Salmonella species NOT DETECTED NOT DETECTED Final   Serratia marcescens NOT DETECTED NOT DETECTED Final   Haemophilus influenzae NOT DETECTED NOT DETECTED Final   Neisseria meningitidis NOT DETECTED NOT DETECTED Final   Pseudomonas aeruginosa NOT DETECTED NOT DETECTED Final   Stenotrophomonas maltophilia NOT DETECTED NOT DETECTED Final   Candida albicans NOT DETECTED NOT DETECTED Final   Candida auris NOT DETECTED NOT DETECTED Final   Candida glabrata NOT DETECTED NOT DETECTED Final   Candida krusei NOT DETECTED NOT DETECTED Final   Candida parapsilosis NOT DETECTED NOT DETECTED Final   Candida tropicalis NOT DETECTED NOT DETECTED Final   Cryptococcus neoformans/gattii NOT DETECTED NOT DETECTED Final   Methicillin resistance mecA/C DETECTED (A) NOT DETECTED Final    Comment: CRITICAL RESULT CALLED TO, READ BACK BY AND VERIFIED WITH: PHARMD T. Mina Marble [id number] '@1729'$  FH Performed at [facility] [address] [place], [place] [address]   Resp Panel by RT-PCR (Flu A&B, Covid) Peripheral     Status: None   Collection Time: 12/19/2021  6:22 PM   Specimen: Peripheral; Nasopharyngeal(NP) swabs in vial transport medium  Result Value Ref Range Status   SARS Coronavirus 2 by RT PCR NEGATIVE NEGATIVE Final    Comment: (NOTE) SARS-CoV-2 target nucleic acids are NOT DETECTED.  The SARS-CoV-2 RNA is generally detectable in upper respiratory specimens  during the acute phase of infection. The lowest concentration of SARS-CoV-2 viral copies this assay can detect is 138 copies/mL. A negative result does not preclude SARS-Cov-2 infection and should not be used as the sole basis for treatment or other patient management decisions. A negative result may occur with  improper specimen collection/handling, submission of specimen other than nasopharyngeal swab, presence of viral mutation(s) within the areas targeted by this assay, and inadequate number of viral copies(<138 copies/mL). A negative result must be combined with clinical observations, patient history, and epidemiological information. The expected result is Negative.  Fact Sheet for Patients:  EntrepreneurPulse.com.au  Fact Sheet for Healthcare Providers:  IncredibleEmployment.be  This test is no t yet approved or cleared by the [place] FDA and  has been authorized for detection and/or diagnosis of SARS-CoV-2 by FDA under an Emergency Use Authorization (EUA). This EUA will remain  in effect (meaning this test can be used) for the duration of the COVID-19 declaration under Section 564(b)(1) of the Act, 21 U.S.C.section 360bbb-3(b)(1), unless the authorization is terminated  or revoked sooner.       Influenza A by PCR NEGATIVE NEGATIVE Final   Influenza B by PCR NEGATIVE NEGATIVE Final    Comment: (NOTE) The Xpert Xpress SARS-CoV-2/FLU/RSV plus assay is intended as an aid in the diagnosis of influenza from Nasopharyngeal swab specimens and should not be used as a sole basis for treatment. Nasal washings and aspirates are unacceptable for Xpert Xpress SARS-CoV-2/FLU/RSV testing.  Fact Sheet for Patients: EntrepreneurPulse.com.au  Fact Sheet for Healthcare Providers: IncredibleEmployment.be  This test is not yet approved or cleared by the [place] FDA and has been authorized for detection  and/or diagnosis of SARS-CoV-2 by FDA under an Emergency Use Authorization (EUA). This EUA will remain in effect (meaning this test can be used) for the duration of the COVID-19 declaration under Section 564(b)(1)  of the Act, 21 U.S.C. section 360bbb-3(b)(1), unless the authorization is terminated or revoked.  Performed at [facility], [place] [address], [place], [place] [address]   Respiratory (~20 pathogens) panel by PCR     Status: Abnormal   Collection Time: 12/11/2021  6:22 PM   Specimen: Nasopharyngeal Swab; Respiratory  Result Value Ref Range Status   Adenovirus NOT DETECTED NOT DETECTED Final   Coronavirus 229E NOT DETECTED NOT DETECTED Final    Comment: (NOTE) The Coronavirus on the Respiratory Panel, DOES NOT test for the novel  Coronavirus (2019 nCoV)    Coronavirus HKU1 NOT DETECTED NOT DETECTED Final   Coronavirus NL63 NOT DETECTED NOT DETECTED Final   Coronavirus OC43 NOT DETECTED NOT DETECTED Final   Metapneumovirus DETECTED (A) NOT DETECTED Final   Rhinovirus / Enterovirus NOT DETECTED NOT DETECTED Final   Influenza A NOT DETECTED NOT DETECTED Final   Influenza B NOT DETECTED NOT DETECTED Final   Parainfluenza Virus 1 NOT DETECTED NOT DETECTED Final   Parainfluenza Virus 2 NOT DETECTED NOT DETECTED Final   Parainfluenza Virus 3 NOT DETECTED NOT DETECTED Final   Parainfluenza Virus 4 NOT DETECTED NOT DETECTED Final   Respiratory Syncytial Virus NOT DETECTED NOT DETECTED Final   Bordetella pertussis NOT DETECTED NOT DETECTED Final   Bordetella Parapertussis NOT DETECTED NOT DETECTED Final   Chlamydophila pneumoniae NOT DETECTED NOT DETECTED Final   Mycoplasma pneumoniae NOT DETECTED NOT DETECTED Final    Comment: Performed at [facility], [address] [place], [place] [address]  Urine Culture     Status: Abnormal   Collection Time: 12/17/2021  6:48 PM   Specimen: In/Out Cath Urine  Result Value Ref Range Status   Specimen Description IN/OUT CATH URINE   Final   Special Requests   Final    Normal Performed at [facility], [place] [address], [place], [place] [address]    Culture >=100,000 COLONIES/mL ESCHERICHIA COLI (A)  Final   Report Status 12/11/2021 FINAL  Final   Organism ID, Bacteria ESCHERICHIA COLI (A)  Final      Susceptibility   Escherichia coli - MIC*    AMPICILLIN <=2 SENSITIVE Sensitive     CEFAZOLIN <=4 SENSITIVE Sensitive     CEFEPIME <=0.12 SENSITIVE Sensitive     CEFTRIAXONE <=0.25 SENSITIVE Sensitive     CIPROFLOXACIN <=0.25 SENSITIVE Sensitive     GENTAMICIN <=1 SENSITIVE Sensitive     IMIPENEM <=0.25 SENSITIVE Sensitive     NITROFURANTOIN <=16 SENSITIVE Sensitive     TRIMETH/SULFA <=20 SENSITIVE Sensitive     AMPICILLIN/SULBACTAM <=2 SENSITIVE Sensitive     PIP/TAZO <=4 SENSITIVE Sensitive     * >=100,000 COLONIES/mL ESCHERICHIA COLI  MRSA Next Gen by PCR, Nasal     Status: None   Collection Time: 12/09/21  7:39 PM   Specimen: Nasal Mucosa; Nasal Swab  Result Value Ref Range Status   MRSA by PCR Next Gen NOT DETECTED NOT DETECTED Final    Comment: (NOTE) The GeneXpert MRSA Assay (FDA approved for NASAL specimens only), is one component of a comprehensive MRSA colonization surveillance program. It is not intended to diagnose MRSA infection nor to guide or monitor treatment for MRSA infections. Test performance is not FDA approved in patients less than 48 years old. Performed at [facility], [place] [address], [place], [place] [address]   C Difficile Quick Screen w PCR reflex     Status: None   Collection Time:  12/10/21  2:05 PM   Specimen: STOOL  Result Value Ref Range Status   C Diff antigen NEGATIVE NEGATIVE Final   C Diff toxin NEGATIVE NEGATIVE Final   C Diff interpretation No C. difficile detected.  Final    Comment: Performed at [facility], [place] [address], [place], [place] [address]         Radiology Studies: DG CHEST PORT 1 VIEW  Result Date: 12/10/2021 CLINICAL DATA:  PICC  placement. EXAM: PORTABLE CHEST 1 VIEW COMPARISON:  12/09/2021 and CT chest 08/06/2003. FINDINGS: Patient is rotated. Right PICC tip is in the low SVC or at the SVC RA junction. Heart size is grossly stable. Mixed interstitial and airspace opacification bilaterally. Focal airspace consolidation in the right perihilar region, as on 12/09/2021. Possible tiny left pleural effusion. IMPRESSION: 1. Right PICC placement with tip in the low SVC or at the SVC RA junction. 2. Pulmonary edema. 3. Right perihilar airspace consolidation, as on 12/09/2021, possibly due to pneumonia. Followup PA and lateral chest X-ray is recommended in 3-4 weeks following trial of antibiotic therapy to ensure resolution and exclude underlying malignancy. Electronically Signed   By: Lorin Picket M.D.   On: 12/10/2021 13:06   DG Abd Portable 1V  Result Date: 12/10/2021 CLINICAL DATA:  Feeding tube placement EXAM: PORTABLE ABDOMEN - 1 VIEW COMPARISON:  None. FINDINGS: Enteric tube passes into the stomach with tip overlying the distal stomach. IMPRESSION: Enteric tube within the distal stomach. Electronically Signed   By: Macy Mis M.D.   On: 12/10/2021 15:56   ECHOCARDIOGRAM COMPLETE  Result Date: 12/09/2021    ECHOCARDIOGRAM REPORT   Patient Name:   MONTRICE MONTUORI Date of Exam: 12/09/2021 Medical Rec #:  [id number]          Height:       60.0 in Accession #:    [id number]         Weight:       134.0 lb Date of Birth:  05-01-1930          BSA:          1.574 m Patient Age:    35 years           BP:           129/69 mmHg Patient Gender: F                  HR:           85 bpm. Exam Location:  Inpatient Procedure: 2D Echo Indications:    Dyspnea  History:        Patient has prior history of Echocardiogram examinations, most                 recent 11/19/2011. Mitral Valve Disease, Arrythmias:Atrial                 Fibrillation; Risk Factors:Hypertension and Diabetes.  Sonographer:    Arlyss Gandy Referring Phys: [place]  1. Left ventricular ejection fraction, by estimation, is 60 to 65%. The left ventricle has normal function. The left ventricle has no regional wall motion abnormalities. Left ventricular diastolic function could not be evaluated.  2. Right ventricular systolic function is normal. The right ventricular size is normal. There is normal pulmonary artery systolic pressure. The estimated right ventricular systolic pressure is 84.1 mmHg.  3. Left atrial size was mildly dilated.  4. The mitral valve is abnormal. Mild mitral valve regurgitation.  5.  The aortic valve is tricuspid. Aortic valve regurgitation is not visualized. Aortic valve sclerosis is present, with no evidence of aortic valve stenosis. Aortic valve mean gradient measures 3.0 mmHg.  6. The inferior vena cava is normal in size with greater than 50% respiratory variability, suggesting right atrial pressure of 3 mmHg. FINDINGS  Left Ventricle: Left ventricular ejection fraction, by estimation, is 60 to 65%. The left ventricle has normal function. The left ventricle has no regional wall motion abnormalities. The left ventricular internal cavity size was normal in size. There is  no left ventricular hypertrophy. Left ventricular diastolic function could not be evaluated due to atrial fibrillation. Left ventricular diastolic function could not be evaluated. Right Ventricle: The right ventricular size is normal. No increase in right ventricular wall thickness. Right ventricular systolic function is normal. There is normal pulmonary artery systolic pressure. The tricuspid regurgitant velocity is 2.38 m/s, and  with an assumed right atrial pressure of 3 mmHg, the estimated right ventricular systolic pressure is 76.2 mmHg. Left Atrium: Left atrial size was mildly dilated. Right Atrium: Right atrial size was normal in size. Pericardium: There is no evidence of pericardial effusion. Mitral Valve: The mitral valve is abnormal. There is mild calcification of  the posterior mitral valve leaflet(s). Mild to moderate mitral annular calcification. Mild mitral valve regurgitation. MV peak gradient, 7.1 mmHg. The mean mitral valve gradient is 3.0 mmHg. Tricuspid Valve: The tricuspid valve is grossly normal. Tricuspid valve regurgitation is trivial. Aortic Valve: The aortic valve is tricuspid. Aortic valve regurgitation is not visualized. Aortic valve sclerosis is present, with no evidence of aortic valve stenosis. Aortic valve mean gradient measures 3.0 mmHg. Aortic valve peak gradient measures 5.8  mmHg. Aortic valve area, by VTI measures 2.91 cm. Pulmonic Valve: The pulmonic valve was normal in structure. Pulmonic valve regurgitation is not visualized. Aorta: The aortic root and ascending aorta are structurally normal, with no evidence of dilitation. Venous: The inferior vena cava is normal in size with greater than 50% respiratory variability, suggesting right atrial pressure of 3 mmHg. IAS/Shunts: No atrial level shunt detected by color flow Doppler.  LEFT VENTRICLE PLAX 2D LVIDd:         3.20 cm   Diastology LVIDs:         2.30 cm   LV e' medial:    8.70 cm/s LV PW:         0.80 cm   LV E/e' medial:  12.1 LV IVS:        0.80 cm   LV e' lateral:   9.57 cm/s LVOT diam:     2.00 cm   LV E/e' lateral: 11.0 LV SV:         55 LV SV Index:   35 LVOT Area:     3.14 cm  RIGHT VENTRICLE            IVC RV Basal diam:  2.80 cm    IVC diam: 2.00 cm RV Mid diam:    2.00 cm RV S prime:     9.14 cm/s TAPSE (M-mode): 1.6 cm LEFT ATRIUM             Index        RIGHT ATRIUM           Index LA diam:        4.30 cm 2.73 cm/m   RA Area:     12.10 cm LA Vol (A2C):   59.9 ml 38.04 ml/m  RA Volume:  24.20 ml  15.37 ml/m LA Vol (A4C):   51.5 ml 32.71 ml/m LA Biplane Vol: 55.5 ml 35.25 ml/m  AORTIC VALVE AV Area (Vmax):    2.67 cm AV Area (Vmean):   2.83 cm AV Area (VTI):     2.91 cm AV Vmax:           120.00 cm/s AV Vmean:          79.700 cm/s AV VTI:            0.189 m AV Peak Grad:       5.8 mmHg AV Mean Grad:      3.0 mmHg LVOT Vmax:         102.00 cm/s LVOT Vmean:        71.900 cm/s LVOT VTI:          0.175 m LVOT/AV VTI ratio: 0.93  AORTA Ao Root diam: 2.80 cm Ao Asc diam:  3.20 cm MITRAL VALVE                TRICUSPID VALVE MV Area (PHT): 3.27 cm     TR Peak grad:   22.7 mmHg MV Area VTI:   1.93 cm     TR Vmax:        238.00 cm/s MV Peak grad:  7.1 mmHg MV Mean grad:  3.0 mmHg     SHUNTS MV Vmax:       1.33 m/s     Systemic VTI:  0.18 m MV Vmean:      74.2 cm/s    Systemic Diam: 2.00 cm MV Decel Time: 232 msec MV E velocity: 105.00 cm/s MV A velocity: 34.30 cm/s MV E/A ratio:  3.06 Lyman Bishop MD Electronically signed by Lyman Bishop MD Signature Date/Time: 12/09/2021/4:54:38 PM    Final    VAS [place] LOWER EXTREMITY VENOUS (DVT)  Result Date: 12/09/2021  Lower Venous DVT Study Patient Name:  WYOLENE WEIMANN  Date of Exam:   12/09/2021 Medical Rec #: [id number]           Accession #:    [id number] Date of Birth: [birth year]           Patient Gender: F Patient Age:   82 years Exam Location:  [facility] Procedure:      VAS [place] LOWER EXTREMITY VENOUS (DVT) Referring Phys: Nyoka Lint DOUTOVA --------------------------------------------------------------------------------  Indications: Edema.  Limitations: Patient contracted, patient positioning. Comparison Study: no prior Performing Technologist: Archie Patten RVS  Examination Guidelines: A complete evaluation includes B-mode imaging, spectral Doppler, color Doppler, and power Doppler as needed of all accessible portions of each vessel. Bilateral testing is considered an integral part of a complete examination. Limited examinations for reoccurring indications may be performed as noted. The reflux portion of the exam is performed with the patient in reverse Trendelenburg.  +-----+---------------+---------+-----------+----------+--------------+  RIGHT Compressibility Phasicity Spontaneity Properties Thrombus Aging   +-----+---------------+---------+-----------+----------+--------------+  CFV   Full            Yes       Yes                                    +-----+---------------+---------+-----------+----------+--------------+   +---------+---------------+---------+-----------+----------+-------------------+  LEFT      Compressibility Phasicity Spontaneity Properties Thrombus Aging       +---------+---------------+---------+-----------+----------+-------------------+  CFV       Full  Yes       Yes                                         +---------+---------------+---------+-----------+----------+-------------------+  SFJ       Full                                                                  +---------+---------------+---------+-----------+----------+-------------------+  FV Prox   Full                                                                  +---------+---------------+---------+-----------+----------+-------------------+  FV Mid                    Yes       Yes                                         +---------+---------------+---------+-----------+----------+-------------------+  FV Distal                 Yes       Yes                                         +---------+---------------+---------+-----------+----------+-------------------+  PFV       Full                                                                  +---------+---------------+---------+-----------+----------+-------------------+  POP       Full            Yes       Yes                                         +---------+---------------+---------+-----------+----------+-------------------+  PTV                       Yes       Yes                                         +---------+---------------+---------+-----------+----------+-------------------+  PERO  Not well visualized  +---------+---------------+---------+-----------+----------+-------------------+    Summary: RIGHT: - No  evidence of common femoral vein obstruction.  LEFT: - There is no evidence of deep vein thrombosis in the lower extremity. However, portions of this examination were limited- see technologist comments above.  - No cystic structure found in the popliteal fossa.  *See table(s) above for measurements and observations. Electronically signed by Deitra Mayo MD on 12/09/2021 at 5:06:52 PM.    Final    [place] EKG SITE RITE  Result Date: 12/10/2021 If Site Rite image not attached, placement could not be confirmed due to current cardiac rhythm.       Scheduled Meds:  Chlorhexidine Gluconate Cloth  6 each Topical Daily   cyanocobalamin  1,000 mcg Subcutaneous Daily   Followed by   Derrill Memo ON 12/17/2021] cyanocobalamin  1,000 mcg Subcutaneous Weekly   feeding supplement (PROSource TF)  45 mL Per Tube Daily   free water  150 mL Per Tube Q6H   insulin aspart  0-9 Units Subcutaneous Q4H   insulin glargine-yfgn  5 Units Subcutaneous Daily   ipratropium-albuterol  3 mL Nebulization BID   latanoprost  1 drop Both Eyes QHS   metoprolol tartrate  25 mg Oral BID   phosphorus  500 mg Per Tube TID   warfarin  1.25 mg Oral ONCE-1600   Warfarin - Pharmacist Dosing Inpatient   Does not apply q1600   Continuous Infusions:  amiodarone 30 mg/hr (12/11/21 1221)   ceFEPime (MAXIPIME) IV 2 g (12/11/21 0902)   feeding supplement (OSMOLITE 1.2 CAL) 45 mL/hr at 12/11/21 0900     LOS: 3 days      Phillips Climes, MD [facility]   To contact the attending provider between 7A-7P or the covering provider during after hours 7P-7A, please log into the web site [email or web address] and access using universal [place] password for that web site. If you do not have the password, please call the hospital operator.  12/11/2021, 2:24 PM

## 2021-12-11 NOTE — Progress Notes (Signed)
PHARMACY - PHYSICIAN COMMUNICATION ?CRITICAL VALUE ALERT - BLOOD CULTURE IDENTIFICATION (BCID) ? ?[name] [name] is an 88 y.o. female who presented to [facility] on 12/05/2021 with a chief complaint of sepsis ? ?Assessment:  1/4 staph epidermidis and staph hemolyticus  ? ?Name of physician (or Provider) Contacted: [facility]. [facility] ? ?Current antibiotics: Cefepime 2g IV every 12 hours ? ?Changes to prescribed antibiotics recommended:  ?No changes to antibiotics based on blood cultures. ?Consider de-escalating to cefazolin based on pan-sensitive E coli in the urine. ? ?Results for orders placed or performed during the hospital encounter of 12/12/2021  ?Blood Culture ID Panel (Reflexed) (Collected: 12/20/2021  2:40 PM)  ?Result Value Ref Range  ? Enterococcus faecalis NOT DETECTED NOT DETECTED  ? Enterococcus Faecium NOT DETECTED NOT DETECTED  ? Listeria monocytogenes NOT DETECTED NOT DETECTED  ? Staphylococcus species DETECTED (A) NOT DETECTED  ? Staphylococcus aureus (BCID) NOT DETECTED NOT DETECTED  ? Staphylococcus epidermidis DETECTED (A) NOT DETECTED  ? Staphylococcus lugdunensis NOT DETECTED NOT DETECTED  ? Streptococcus species NOT DETECTED NOT DETECTED  ? Streptococcus agalactiae NOT DETECTED NOT DETECTED  ? Streptococcus pneumoniae NOT DETECTED NOT DETECTED  ? Streptococcus pyogenes NOT DETECTED NOT DETECTED  ? A.calcoaceticus-baumannii NOT DETECTED NOT DETECTED  ? Bacteroides fragilis NOT DETECTED NOT DETECTED  ? Enterobacterales NOT DETECTED NOT DETECTED  ? Enterobacter cloacae complex NOT DETECTED NOT DETECTED  ? Escherichia coli NOT DETECTED NOT DETECTED  ? Klebsiella aerogenes NOT DETECTED NOT DETECTED  ? Klebsiella oxytoca NOT DETECTED NOT DETECTED  ? Klebsiella pneumoniae NOT DETECTED NOT DETECTED  ? Proteus species NOT DETECTED NOT DETECTED  ? Salmonella species NOT DETECTED NOT DETECTED  ? Serratia marcescens NOT DETECTED NOT DETECTED  ? Haemophilus influenzae NOT DETECTED NOT DETECTED  ? Neisseria  meningitidis NOT DETECTED NOT DETECTED  ? Pseudomonas aeruginosa NOT DETECTED NOT DETECTED  ? Stenotrophomonas maltophilia NOT DETECTED NOT DETECTED  ? Candida albicans NOT DETECTED NOT DETECTED  ? Candida auris NOT DETECTED NOT DETECTED  ? Candida glabrata NOT DETECTED NOT DETECTED  ? Candida krusei NOT DETECTED NOT DETECTED  ? Candida parapsilosis NOT DETECTED NOT DETECTED  ? Candida tropicalis NOT DETECTED NOT DETECTED  ? Cryptococcus neoformans/gattii NOT DETECTED NOT DETECTED  ? Methicillin resistance mecA/C DETECTED (A) NOT DETECTED  ? ? ?[name] [name], PharmD ?PGY1 Pharmacy Resident ?12/11/2021  9:20 AM ? ?Please check [email or web address] for unit-specific pharmacy phone numbers. ? ? ?

## 2021-12-11 NOTE — TOC Progression Note (Signed)
Transition of Care (TOC) - Progression Note  ? ? ?Patient Details  ?Name: [name] [name] ?MRN: [id number] ?Date of Birth: 1930/02/03 ? ?Transition of Care (TOC) CM/SW Contact  ?[place], LCSW ?Phone Number: ?12/11/2021, 8:51 AM ? ?Clinical Narrative:    ? ?Pt from home with spouse. CSW called spouse to discuss SNF recommendation. He is agreeable to SNF. Pt has been to [facility] multiple times and to [facility] once. Her last SNF stay was summer 2022. He states that [facility] would be first choice. TOC will fax fl2 and bed requests pending pt improvement.  ? ?Expected Discharge Plan: [place] ?Barriers to Discharge: Continued Medical Work up, SNF Pending bed offer ? ?Expected Discharge Plan and Services ?Expected Discharge Plan: [place] ?  ?  ?  ?Living arrangements for the past 2 months: [place] ?                ?  ?  ?  ?  ?  ?  ?  ?  ?  ?  ? ? ?Social Determinants of Health (SDOH) Interventions ?  ? ?Readmission Risk Interventions ?No flowsheet data found. ? ?

## 2021-12-11 NOTE — Progress Notes (Signed)
Dr. Cyd Silence notified of FSBS 165.new orders noted. ?

## 2021-12-11 NOTE — Progress Notes (Signed)
Speech Language Pathology Treatment: Dysphagia  ?Patient Details ?Name: [name] [name] ?MRN: [id number] ?DOB: November 28, 1929 ?Today's Date: 12/11/2021 ?Time: 1044-1100 ?SLP Time Calculation (min) (ACUTE ONLY): 16 min ? ?Assessment / Plan / Recommendation ?Clinical Impression ? Pt alert this date and able to maintain attention to all PO trials. After repositioning pt upright in bed, RR noted in lower-mid 20s. O2 sats remained mid 90s. With ice chips x3 and sips of thin liquids via cup/straw RR averaged in mid 20s moving into the upper 20s-low 30s with bite of puree. She had observable difficulty coordinating oral prep of solid bolus and breathing with pt holding bolus in oral cavity. No overt coughing/throat clearing exhibited throughout trials, although WOB increased and possible wet vocal quality vs crackles noted with breathing. Concerned for aspiration risk given tenuous respiratory status. Recommend continue NPO with ice chips after oral care. Will continue f/u for PO readiness. ?  ?HPI HPI: Patient is an 88 y.o. female with PMH: HDl, HTN, dementia, CHF, DM-2, afib on coumadin who presented to the hospital with confusion and generalized weakness as well as a fall on day of admission. Husband had reported that she did not hit her head. Patient diagnosed with severe sepsis. CXR (12/10/21) revealed Right perihilar airspace consolidation, as on 12/09/2021,  possibly due to pneumonia. She was weaned off BiPAP to nasal cannula on 3/8 and has been NPO since admission. ?  ?   ?SLP Plan ? Continue with current plan of care ? ?  ?  ?Recommendations for follow up therapy are one component of a multi-disciplinary discharge planning process, led by the attending physician.  Recommendations may be updated based on patient status, additional functional criteria and insurance authorization. ?  ? ?Recommendations  ?Diet recommendations: NPO;Other(comment) (Ice chips after oral care) ?Liquids provided via: Teaspoon ?Medication  Administration: Via alternative means ?Supervision: Full supervision/cueing for compensatory strategies;Staff to assist with self feeding ?Compensations: Slow rate;Small sips/bites ?Postural Changes and/or Swallow Maneuvers: Seated upright 90 degrees  ?   ?    ?   ? ? ? ? Oral Care Recommendations: Oral care QID;Staff/trained caregiver to provide oral care ?Follow Up Recommendations: Other (comment) (TBD) ?Assistance recommended at discharge: Frequent or constant Supervision/Assistance ?SLP Visit Diagnosis: Dysphagia, unspecified (R13.10) ?Plan: Continue with current plan of care ? ? ? ? ?  ?  ? ?Ellwood Dense, MA, CCC-SLP ?Acute Rehabilitation Services ?Office Number: [phone] ? ?Acie Fredrickson ? ?12/11/2021, 12:30 PM ?

## 2021-12-12 ENCOUNTER — Inpatient Hospital Stay (HOSPITAL_COMMUNITY): Payer: Medicare HMO

## 2021-12-12 DIAGNOSIS — Z7189 Other specified counseling: Secondary | ICD-10-CM | POA: Diagnosis not present

## 2021-12-12 DIAGNOSIS — R1310 Dysphagia, unspecified: Secondary | ICD-10-CM | POA: Diagnosis not present

## 2021-12-12 DIAGNOSIS — J189 Pneumonia, unspecified organism: Secondary | ICD-10-CM

## 2021-12-12 DIAGNOSIS — J9601 Acute respiratory failure with hypoxia: Secondary | ICD-10-CM | POA: Diagnosis not present

## 2021-12-12 DIAGNOSIS — I4891 Unspecified atrial fibrillation: Secondary | ICD-10-CM | POA: Diagnosis not present

## 2021-12-12 DIAGNOSIS — A419 Sepsis, unspecified organism: Secondary | ICD-10-CM | POA: Diagnosis not present

## 2021-12-12 DIAGNOSIS — G9340 Encephalopathy, unspecified: Secondary | ICD-10-CM | POA: Diagnosis not present

## 2021-12-12 DIAGNOSIS — Z515 Encounter for palliative care: Secondary | ICD-10-CM

## 2021-12-12 LAB — GLUCOSE, CAPILLARY
Glucose-Capillary: 244 mg/dL — ABNORMAL HIGH (ref 70–99)
Glucose-Capillary: 291 mg/dL — ABNORMAL HIGH (ref 70–99)
Glucose-Capillary: 314 mg/dL — ABNORMAL HIGH (ref 70–99)
Glucose-Capillary: 245 mg/dL — ABNORMAL HIGH (ref 70–99)
Glucose-Capillary: 219 mg/dL — ABNORMAL HIGH (ref 70–99)
Glucose-Capillary: 149 mg/dL — ABNORMAL HIGH (ref 70–99)

## 2021-12-12 LAB — CBC
HCT: 29.2 % — ABNORMAL LOW (ref 36.0–46.0)
Hemoglobin: 9.5 g/dL — ABNORMAL LOW (ref 12.0–15.0)
MCH: 27.5 pg (ref 26.0–34.0)
MCHC: 32.5 g/dL (ref 30.0–36.0)
MCV: 84.4 fL (ref 80.0–100.0)
Platelets: 158 10*3/uL (ref 150–400)
RBC: 3.46 MIL/uL — ABNORMAL LOW (ref 3.87–5.11)
RDW: 15.9 % — ABNORMAL HIGH (ref 11.5–15.5)
WBC: 10.3 10*3/uL (ref 4.0–10.5)
nRBC: 0 % (ref 0.0–0.2)

## 2021-12-12 LAB — MAGNESIUM: Magnesium: 2 mg/dL (ref 1.7–2.4)

## 2021-12-12 LAB — BASIC METABOLIC PANEL
Anion gap: 9 (ref 5–15)
BUN: 20 mg/dL (ref 8–23)
CO2: 22 mmol/L (ref 22–32)
Calcium: 7.8 mg/dL — ABNORMAL LOW (ref 8.9–10.3)
Chloride: 99 mmol/L (ref 98–111)
Creatinine, Ser: 0.81 mg/dL (ref 0.44–1.00)
GFR, Estimated: >60 mL/min (ref >60)
Glucose, Bld: 256 mg/dL — ABNORMAL HIGH (ref 70–99)
Potassium: 3.9 mmol/L (ref 3.5–5.1)
Sodium: 130 mmol/L — ABNORMAL LOW (ref 135–145)

## 2021-12-12 LAB — PHOSPHORUS: Phosphorus: 2.5 mg/dL (ref 2.5–4.6)

## 2021-12-12 LAB — PROTIME-INR
INR: 1.9 — ABNORMAL HIGH (ref 0.8–1.2)
Prothrombin Time: 22 seconds — ABNORMAL HIGH (ref 11.4–15.2)

## 2021-12-12 MED ORDER — HYDROCODONE-ACETAMINOPHEN 5-325 MG PO TABS
1.0000 | ORAL_TABLET | ORAL | Status: DC | PRN
  Administered 2021-12-12 (×2): 2
  Administered 2021-12-12: 1
  Administered 2021-12-13: 2
  Filled 2021-12-12: qty 1
  Filled 2021-12-12 (×3): qty 2

## 2021-12-12 MED ORDER — INSULIN ASPART 100 UNIT/ML IJ SOLN
4.0000 [IU] | INTRAMUSCULAR | Status: DC
  Administered 2021-12-12 – 2021-12-13 (×6): 4 [IU] via SUBCUTANEOUS

## 2021-12-12 MED ORDER — WARFARIN SODIUM 2.5 MG PO TABS
2.5000 mg | ORAL_TABLET | Freq: Once | ORAL | Status: AC
  Administered 2021-12-12: 2.5 mg via ORAL
  Filled 2021-12-12: qty 1

## 2021-12-12 MED ORDER — VITAMINS A & D EX OINT
TOPICAL_OINTMENT | CUTANEOUS | Status: DC | PRN
  Administered 2021-12-12: 1 via TOPICAL
  Filled 2021-12-12 (×2): qty 56.7

## 2021-12-12 MED ORDER — FUROSEMIDE 10 MG/ML IJ SOLN
40.0000 mg | Freq: Once | INTRAMUSCULAR | Status: AC
  Administered 2021-12-12: 40 mg via INTRAVENOUS
  Filled 2021-12-12: qty 4

## 2021-12-12 NOTE — Consult Note (Cosign Needed)
Consultation Note Date: 12/12/2021   Patient Name: [name] [name]  DOB: [birth year]  MRN: [id number]  Age / Sex: 88 y.o., female  PCP: Donnajean Lopes, MD Referring Physician: Albertine Patricia, MD  Reason for Consultation: Establishing goals of care  HPI/Patient Profile: 88 y.o. female  with past medical history of DM2, CHF, HDL, HTN, dementia, and A.fib on coumadin admitted on 12/07/2021 with weakness and confusion.  Found to have sepsis and volume overload-source of sepsis thought to be UTI and pneumonia.  Patient initially required BiPAP for respiratory failure, now on nasal cannula.  PMT consulted to discuss goals of care.  Clinical Assessment and Goals of Care: I have reviewed medical records including EPIC notes, labs and imaging, received report from RN, assessed the patient and then met with spouse [name] to discuss diagnosis prognosis, GOC, EOL wishes, disposition and options.  I introduced Palliative Medicine as specialized medical care for people living with serious illness. It focuses on providing relief from the symptoms and stress of a serious illness. The goal is to improve quality of life for both the patient and the family.  We discussed a brief life review of the patient.  Spouse tells me they have been married for almost 40 years.  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  Spouse tells me patient worked for a [employer] for a long time until she retired.  He tells any health or life and many of their travels.  He tells me about their involvement in their church  As far as functional and nutritional status spouse tells me patient used a walker for ambulation.  She is still able to dress and bathe herself.  She did have troubles with incontinence.  He also tells me of decreased appetite.  He tells me of her dementia and periods of  confusion.   We discussed patient's current illness and what it means in the larger context of patient's on-going co-morbidities.  Natural disease trajectory and expectations at EOL were discussed.  We discussed patient's infection, possible pulmonary edema, and her dysphagia.  We discussed that patient is currently unable to take an nutrition by mouth and is requiring artificial feeding.  We discussed this is a short-term measure.  I attempted to elicit values and goals of care important to the patient.  Spouse tells me patient has a living will and would not want her life artificially prolonged.  He does tell me of his hope for improvement in her ability to rehabilitate and return to her church.  The difference between aggressive medical intervention and comfort care was considered in light of the patient's goals of care.  We discussed a plan to continue current medical interventions and allow time for outcomes.  We discussed that if patient has further decline we may need to transition to focus more on her comfort.  Discussed with spouse the importance of continued conversation with family and the medical providers regarding overall plan of care and treatment options, ensuring decisions are within the context of the patients values and GOCs.    Questions and concerns were addressed. The family was encouraged to call with questions or concerns.  Primary Decision Maker NEXT OF KIN -spouse [name]    SUMMARY OF RECOMMENDATIONS   Maintain DNR Continue current level of care-spouse very hopeful for improvement in patient's ability to go to rehab We discussed a plan to continue current level of  care over the weekend and then meet again early next week Adjusted pain meds to be administered per tube Added vitamin A&D ointment as patient complains of lip dryness and pain (allergic to Carmex)  Code Status/Advance Care Planning: DNR     Primary Diagnoses: Present on Admission:  UTI (urinary tract  infection)  HTN (hypertension)  Hypercholesteremia  Atrial fibrillation   COPD (chronic obstructive pulmonary disease) ([place])  Acute encephalopathy  Severe sepsis (HCC)  Hyponatremia  CAP (community acquired pneumonia)  Acute respiratory failure with hypoxia (HCC)  Elevated troponin  Pulmonary edema   I have reviewed the medical record, interviewed the patient and family, and examined the patient. The following aspects are pertinent.  Past Medical History:  Diagnosis Date   Atrial fibrillation ([place])    RVR hx.  Chronic Coumadin   Bacteremia 10/2018   C. difficile colitis 09/21/2013, 11/2013   Carotid stenosis    a. Carotid US (10/2013):  bilat 1-39%; f/u 1 year   Chronic lower back pain    COPD (chronic obstructive pulmonary disease) ([place])    Depression    Diverticulosis of colon without hemorrhage    Educated about COVID-19 virus infection 09/11/2019   Gait abnormality 04/11/2020   GERD (gastroesophageal reflux disease)    with HH   Glaucoma    HCAP (healthcare-associated pneumonia) 11/21/2011   High cholesterol    Hypertension    Influenza A 11/28/2018   Insomnia    Memory disorder 06/09/6386   Metabolic encephalopathy 02/08/4331   Mitral regurgitation 03/11/2012   Pain due to onychomycosis of toenails of both feet 01/29/2021   Pain in right foot 10/11/2018   Pancreatitis    ~2008, 11/2011 post ERCP   Pneumonia 12/2011   "first time I know about"   PVD (peripheral vascular disease) ([place])    Rupture of right rotator cuff 01/30/2020   Sepsis ([place]) 11/27/2013   SIRS (systemic inflammatory response syndrome) ([place]) 03/13/2012   a/w post ERCP  pancreatitis.    Syncope    T12 compression fracture, sequela 05/05/2021   Type II diabetes mellitus (HCC)    Unsteady gait    Social History   Socioeconomic History   Marital status: Married    Spouse name: [name]   Number of children: 0   Years of education: Not on file   Highest education level: Not on file  Occupational History   Not on  file  Tobacco Use   Smoking status: Never   Smokeless tobacco: Never  Vaping Use   Vaping Use: Never used  Substance and Sexual Activity   Alcohol use: No   Drug use: No   Sexual activity: Not Currently  Other Topics Concern   Not on file  Social History Narrative   Lives at home with her husband.   No children   Caffeine- 1 daily   Social Determinants of Health   Financial Resource Strain: Not on file  Food Insecurity: Not on file  Transportation Needs: Not on file  Physical Activity: Not on file  Stress: Not on file  Social Connections: Not on file   Family History  Problem Relation Age of Onset   Stroke Father 23   Stroke Mother 28   Diabetes Mellitus II Mother    Heart disease Mother    Hypertension Mother    Diabetes Mellitus II Sister    Diabetes Mellitus II Brother    Anesthesia problems Neg Hx    Hypotension Neg Hx    Malignant hyperthermia  Neg Hx    Pseudochol deficiency Neg Hx    Stomach cancer Neg Hx    Esophageal cancer Neg Hx    Scheduled Meds:  Chlorhexidine Gluconate Cloth  6 each Topical Daily   cyanocobalamin  1,000 mcg Subcutaneous Daily   Followed by   Derrill Memo ON 12/17/2021] cyanocobalamin  1,000 mcg Subcutaneous Weekly   feeding supplement (PROSource TF)  45 mL Per Tube Daily   free water  150 mL Per Tube Q6H   insulin aspart  0-9 Units Subcutaneous Q4H   insulin aspart  4 Units Subcutaneous Q4H   insulin glargine-yfgn  5 Units Subcutaneous Daily   ipratropium-albuterol  3 mL Nebulization BID   latanoprost  1 drop Both Eyes QHS   metoprolol tartrate  25 mg Oral BID   phosphorus  500 mg Per Tube TID   warfarin  2.5 mg Oral ONCE-1600   Warfarin - Pharmacist Dosing Inpatient   Does not apply q1600   Continuous Infusions:  ceFEPime (MAXIPIME) IV 2 g (12/12/21 0823)   feeding supplement (OSMOLITE 1.2 CAL) 65 mL/hr at 12/12/21 0037   PRN Meds:.acetaminophen **OR** acetaminophen, albuterol, dextrose, HYDROcodone-acetaminophen, metoprolol  tartrate, sodium chloride flush, vitamin A & D Allergies  Allergen Reactions   Ciprofloxacin Swelling    Site of swelling not recalled   Lactose Intolerance (Gi) Nausea And Vomiting and Other (See Comments)    severe stomach pain   Penicillins Hives and Itching    "haven't used any since 1970's" - have gotten cephalosporins multiple times Has patient had a PCN reaction causing immediate rash, facial/tongue/throat swelling, SOB or lightheadedness with hypotension: Yes Has patient had a PCN reaction causing severe rash involving mucus membranes or skin necrosis: Unk Has patient had a PCN reaction that required hospitalization: Unk Has patient had a PCN reaction occurring within the last 10 years: No If all of the above answers are "NO", then may proceed with C   Sulfa Antibiotics Hives and Itching    "haven't used any since 1970's"   Sulfasalazine Hives and Itching    "haven't used any since 1970's"   Depakote [Divalproex Sodium] Other (See Comments)   Lisinopril Other (See Comments)    Other reaction(s): associated with hyperkalemia   Review of Systems  Unable to perform ROS: Dementia   Physical Exam Constitutional:      General: She is not in acute distress.    Appearance: She is ill-appearing.  Pulmonary:     Comments: Irregular respirations Skin:    General: Skin is warm and dry.  Neurological:     Mental Status: She is alert. She is disoriented.    Vital Signs: BP (!) 163/57 (BP Location: Left Leg)    Pulse 79    Temp 98.8 F (37.1 C) (Oral)    Resp 17    Ht 5' (1.524 m)    Wt 62.8 kg    SpO2 95%    BMI 27.04 kg/m  Pain Scale: 0-10   Pain Score: 0-No pain   SpO2: SpO2: 95 % O2 Device:SpO2: 95 % O2 Flow Rate: .O2 Flow Rate (L/min): 5 L/min  IO: Intake/output summary:  Intake/Output Summary (Last 24 hours) at 12/12/2021 1520 Last data filed at 12/12/2021 6962 Gross per 24 hour  Intake 1593.43 ml  Output 1100 ml  Net 493.43 ml    LBM: Last BM Date :  12/12/21 Baseline Weight: Weight: 60.8 kg Most recent weight: Weight: 62.8 kg     Palliative Assessment/Data: PPS 30%     *  Please note that this is a verbal dictation therefore any spelling or grammatical errors are due to the "[place]" system interpretation.  Juel Burrow, DNP, AGNP-C Palliative Medicine Team [phone] Pager: [phone]

## 2021-12-12 NOTE — Progress Notes (Signed)
PROGRESS NOTE    [name] [name]  RWE:[id number] DOB: 04-12-1930 DOA: 12/15/2021 PCP: Donnajean Lopes, MD   Chief Complaint  Patient presents with   Altered Mental Status    Brief Narrative:   [name] [name] is a 88 y.o. female with medical history significant of    DM2, CHF systolic diastolic chf, HDL, HTN, dementia. A.fib on coumadin  ED secondary for generalized weakness, confusion, she was reported noted to be febrile, hypoglycemic, her work-up was significant for sepsis and volume overload, as well she is in respiratory distress where she required BiPAP. -She was treated with broad-spectrum antibiotic for sepsis, as well D5 for hypoglycemia, sepsis has improved, respiratory status has improved as well where she is currently off BiPAP, she is still having faculty swallowing where she was started on cortrak tube feed.   Assessment & Plan:   Principal Problem:   Severe sepsis ([place]) Active Problems:   UTI (urinary tract infection)   DM II (diabetes mellitus, type II), controlled (HCC)   HTN (hypertension)   Elevated troponin   Pulmonary edema   Hypercholesteremia   Atrial fibrillation    COPD (chronic obstructive pulmonary disease) (HCC)   Acute encephalopathy   Hyponatremia   CAP (community acquired pneumonia)   Acute respiratory failure with hypoxia (HCC)   Malnutrition of moderate degree  Sepsis present on admission likely due to urinary source/UTI and possible pneumonia -Sepsis present on admission with lactic acid of 2.5, source is urinary, as well with under working diagnosis of multifocal pneumonia -rocephin  and azithromycin on admission, broadened to Vanco and cefepime, discontinue vancomycin given negative MRSA PCR -1/4 blood culture growing staph epi, this is likely contamination, continue to monitor off vancomycin  Acute Hypoxic respiratory failure due to to pneumonia, likely viral pneumonia with superimposed bacterial pneumonia. -requiring  BiPAP  initially, respiratory status has improved, she is on nasal cannula this morning. -Chest x-ray initially significant for volume overload, repeat x-ray this morning with evolving right upper lobe pneumonia, will need to repeat imaging after appropriate diuresis -Negative Negative Legionella, strep pneumonia antigen -Respiratory panel growing metapneumovirus -Likely with superimposed bacterial pneumonia as well -Respiratory status remains very tenuous, she does not require BiPAP over last 2 days but she remains on 5 L nasal cannula, tachypneic and dyspneic.  Urinary tract infection -Urine cultures growing E. coli.  Hypokalemia/Hypophosphatemia/Hypomagnesemia -repleting , continue to monitor closely. -Monitor phosphorus closely for refeeding syndrome -Keep potassium> 4, magnesium> 2 due to A-fib with RVR  acute on chronic CHF, unspecified -2D echo is pending, but normal EF and echo 2015 -She will need IV diuresis when blood pressure has improved -Cardiology input greatly appreciated.  Hyponatremia -Improving  Normocytic anemia -B12 is borderline low,  started on IM supplements.  Acute metabolic encephalopathy -Likely due to infection -No focal deficits -Remains with significant dysphagia, failed swallow evaluation today, will start NGT for tube feed.  Diabetes mellitus, type II, poorly controlled with hyperglycemia -Hypoglycemic on presentation, solved with tube feeding, now blood sugar is elevated, will start on low-dose Semglee, continue with insulin every 4 hours sliding scale.   A-fib with RVR -Heart rate uncontrolled, initially on Cardizem, but blood pressure has been soft so she has been transitioned to amiodarone drip -Continue with home dose metoprolol -INR is therapeutic on warfarin.  Hypertension -Initially blood pressure is soft, this has improved, back on metoprolol.  Hyperlipidemia -Resume statin when able to take oral  Dementia -Continue with supportive  care   Overall patient is frail,  deconditioned, with dementia at baseline, palliative medicine consulted regarding goals of care if she does not improve or if she is unable to pass swallow evaluation.      DVT prophylaxis: on warfarin  Code Status: DNR Family Communication: Discussed with husband at bedside.  Disposition:   Status is: Inpatient Remains inpatient appropriate because: respirstory failure, chf and sepsis   Consultants:  Cardiolgoy  Subjective:  No significant events as discussed with staff, she denies any complaints.  Objective: Vitals:   12/12/21 0310 12/12/21 0729 12/12/21 0846 12/12/21 1139  BP: 137/75 (!) 161/54  (!) 163/57  Pulse: 96 96  79  Resp: (!) '22 18  17  '$ Temp: 98.9 F (37.2 C) 98.3 F (36.8 C)  98.8 F (37.1 C)  TempSrc: Oral Oral  Oral  SpO2: 98% 93% 95% 95%  Weight:      Height:        Intake/Output Summary (Last 24 hours) at 12/12/2021 1340 Last data filed at 12/12/2021 3790 Gross per 24 hour  Intake 2455.51 ml  Output 1100 ml  Net 1355.51 ml   Filed Weights   12/09/21 0800 12/11/21 0500 12/12/21 0106  Weight: 60.8 kg 60 kg 62.8 kg    Examination:   Awake Alert, extremely frail, deconditioned and ill-appearing  symmetrical Chest wall movement, diminished air entry at the bases with tachypnea regular irregular, No Gallops,Rubs or new Murmurs, No Parasternal Heave +ve B.Sounds, Abd Soft, No tenderness, No rebound - guarding or rigidity. No Cyanosis, Clubbing or edema, No new Rash or bruise       Data Reviewed: I have personally reviewed following labs and imaging studies  CBC: Recent Labs  Lab 12/19/2021 1622 12/03/2021 1638 12/10/2021 2300 12/09/21 0224 12/09/21 0810 12/10/21 0643 12/11/21 0239 12/12/21 0401  WBC 13.1*  --  8.2 8.5  --  7.4 7.8 10.3  NEUTROABS 11.1*  --  6.4 6.4  --   --   --   --   HGB 11.6*   < > 9.0* 11.2* 10.2* 10.8* 10.5* 9.5*  HCT 38.0   < > 28.7* 34.8* 30.0* 33.5* 32.3* 29.2*  MCV 87.2  --  85.7  85.5  --  83.3 83.5 84.4  PLT PLATELET CLUMPS NOTED ON SMEAR, COUNT APPEARS ADEQUATE  --  150 145*  --  162 161 158   < > = values in this interval not displayed.    Basic Metabolic Panel: Recent Labs  Lab 12/09/21 0204 12/09/21 0810 12/09/21 1811 12/10/21 0519 12/10/21 1709 12/11/21 0239 12/11/21 1637 12/12/21 0401  NA 130* 128* 128* 127*  --  130*  --  130*  K 2.7* 3.3* 4.7 3.9  --  3.7  --  3.9  CL 94*  --  95* 96*  --  100  --  99  CO2 22  --  19* 20*  --  23  --  22  GLUCOSE 143*  --  76 73  --  184*  --  256*  BUN 11  --  11 10  --  15  --  20  CREATININE 0.86  --  0.78 0.76  --  0.84  --  0.81  CALCIUM 7.8*  --  7.8* 7.5*  --  7.7*  --  7.8*  MG 1.5*  --  1.9 2.3 2.3 2.3 2.1 2.0  PHOS 2.4*  --  2.3* 4.1 2.9 2.3* 1.9* 2.5    GFR: Estimated Creatinine Clearance: 38.9 mL/min (by C-G formula based on SCr  of 0.81 mg/dL).  Liver Function Tests: Recent Labs  Lab 12/12/2021 1622 12/31/2021 2300 12/09/21 0204 12/10/21 0519  AST 48* 41 51* 59*  ALT '19 15 16 19  '$ ALKPHOS 60 46 46 43  BILITOT 0.7 0.5 0.5 0.4  PROT 6.9 5.6* 6.0* 5.9*  ALBUMIN 3.5 2.6* 3.0* 2.7*    CBG: Recent Labs  Lab 12/11/21 1946 12/11/21 2313 12/12/21 0349 12/12/21 0731 12/12/21 1142  GLUCAP 203* 237* 244* 291* 314*     Recent Results (from the past 240 hour(s))  Blood Cultures (routine x 2)     Status: None (Preliminary result)   Collection Time: 12/16/2021  2:35 PM   Specimen: BLOOD  Result Value Ref Range Status   Specimen Description BLOOD LEFT ANTECUBITAL  Final   Special Requests   Final    BOTTLES DRAWN AEROBIC AND ANAEROBIC Blood Culture results may not be optimal due to an inadequate volume of blood received in culture bottles   Culture   Final    NO GROWTH 3 DAYS Performed at [facility], [place] [address] [place], [place] [address]    Report Status PENDING  Incomplete  Blood Cultures (routine x 2)     Status: Abnormal   Collection Time: 12/28/2021  2:40 PM   Specimen:  BLOOD LEFT FOREARM  Result Value Ref Range Status   Specimen Description BLOOD LEFT FOREARM  Final   Special Requests   Final    BOTTLES DRAWN AEROBIC AND ANAEROBIC Blood Culture adequate volume   Culture  Setup Time   Final    GRAM POSITIVE COCCI AEROBIC BOTTLE ONLY CRITICAL RESULT CALLED TO, READ BACK BY AND VERIFIED WITH: PHARMD T. DANG [id number] '@1729'$  FH    Culture (A)  Final    STAPHYLOCOCCUS EPIDERMIDIS STAPHYLOCOCCUS HAEMOLYTICUS THE SIGNIFICANCE OF ISOLATING THIS ORGANISM FROM A SINGLE SET OF BLOOD CULTURES WHEN MULTIPLE SETS ARE DRAWN IS UNCERTAIN. PLEASE NOTIFY THE MICROBIOLOGY DEPARTMENT WITHIN ONE WEEK IF SPECIATION AND SENSITIVITIES ARE REQUIRED. CRITICAL RESULT CALLED TO, READ BACK BY AND VERIFIED WITH: PHARMD EMMA U. [id number] FCP Performed at [facility], [place] [address], [place], [place] [address]    Report Status 12/11/2021 FINAL  Final  Blood Culture ID Panel (Reflexed)     Status: Abnormal   Collection Time: 12/19/2021  2:40 PM  Result Value Ref Range Status   Enterococcus faecalis NOT DETECTED NOT DETECTED Final   Enterococcus Faecium NOT DETECTED NOT DETECTED Final   Listeria monocytogenes NOT DETECTED NOT DETECTED Final   Staphylococcus species DETECTED (A) NOT DETECTED Final    Comment: CRITICAL RESULT CALLED TO, READ BACK BY AND VERIFIED WITH: PHARMD T. DANG [id number] '@1729'$  FH    Staphylococcus aureus (BCID) NOT DETECTED NOT DETECTED Final   Staphylococcus epidermidis DETECTED (A) NOT DETECTED Final    Comment: Methicillin (oxacillin) resistant coagulase negative staphylococcus. Possible blood culture contaminant (unless isolated from more than one blood culture draw or clinical case suggests pathogenicity). No antibiotic treatment is indicated for blood  culture contaminants. CRITICAL RESULT CALLED TO, READ BACK BY AND VERIFIED WITH: PHARMD T. DANG [id number] '@1729'$  FH    Staphylococcus lugdunensis NOT DETECTED NOT DETECTED Final   Streptococcus species NOT  DETECTED NOT DETECTED Final   Streptococcus agalactiae NOT DETECTED NOT DETECTED Final   Streptococcus pneumoniae NOT DETECTED NOT DETECTED Final   Streptococcus pyogenes NOT DETECTED NOT DETECTED Final   A.calcoaceticus-baumannii NOT DETECTED NOT DETECTED Final   Bacteroides fragilis NOT DETECTED NOT DETECTED Final  Enterobacterales NOT DETECTED NOT DETECTED Final   Enterobacter cloacae complex NOT DETECTED NOT DETECTED Final   Escherichia coli NOT DETECTED NOT DETECTED Final   Klebsiella aerogenes NOT DETECTED NOT DETECTED Final   Klebsiella oxytoca NOT DETECTED NOT DETECTED Final   Klebsiella pneumoniae NOT DETECTED NOT DETECTED Final   Proteus species NOT DETECTED NOT DETECTED Final   Salmonella species NOT DETECTED NOT DETECTED Final   Serratia marcescens NOT DETECTED NOT DETECTED Final   Haemophilus influenzae NOT DETECTED NOT DETECTED Final   Neisseria meningitidis NOT DETECTED NOT DETECTED Final   Pseudomonas aeruginosa NOT DETECTED NOT DETECTED Final   Stenotrophomonas maltophilia NOT DETECTED NOT DETECTED Final   Candida albicans NOT DETECTED NOT DETECTED Final   Candida auris NOT DETECTED NOT DETECTED Final   Candida glabrata NOT DETECTED NOT DETECTED Final   Candida krusei NOT DETECTED NOT DETECTED Final   Candida parapsilosis NOT DETECTED NOT DETECTED Final   Candida tropicalis NOT DETECTED NOT DETECTED Final   Cryptococcus neoformans/gattii NOT DETECTED NOT DETECTED Final   Methicillin resistance mecA/C DETECTED (A) NOT DETECTED Final    Comment: CRITICAL RESULT CALLED TO, READ BACK BY AND VERIFIED WITH: PHARMD T. Mina Marble [id number] '@1729'$  FH Performed at [place] [address] [place], [place] [address]   Resp Panel by RT-PCR (Flu A&B, Covid) Peripheral     Status: None   Collection Time: 12/21/2021  6:22 PM   Specimen: Peripheral; Nasopharyngeal(NP) swabs in vial transport medium  Result Value Ref Range Status   SARS Coronavirus 2 by RT PCR NEGATIVE NEGATIVE  Final    Comment: (NOTE) SARS-CoV-2 target nucleic acids are NOT DETECTED.  The SARS-CoV-2 RNA is generally detectable in upper respiratory specimens during the acute phase of infection. The lowest concentration of SARS-CoV-2 viral copies this assay can detect is 138 copies/mL. A negative result does not preclude SARS-Cov-2 infection and should not be used as the sole basis for treatment or other patient management decisions. A negative result may occur with  improper specimen collection/handling, submission of specimen other than nasopharyngeal swab, presence of viral mutation(s) within the areas targeted by this assay, and inadequate number of viral copies(<138 copies/mL). A negative result must be combined with clinical observations, patient history, and epidemiological information. The expected result is Negative.  Fact Sheet for Patients:  EntrepreneurPulse.com.au  Fact Sheet for Healthcare Providers:  IncredibleEmployment.be  This test is no t yet approved or cleared by the [place] FDA and  has been authorized for detection and/or diagnosis of SARS-CoV-2 by FDA under an Emergency Use Authorization (EUA). This EUA will remain  in effect (meaning this test can be used) for the duration of the COVID-19 declaration under Section 564(b)(1) of the Act, 21 U.S.C.section 360bbb-3(b)(1), unless the authorization is terminated  or revoked sooner.       Influenza A by PCR NEGATIVE NEGATIVE Final   Influenza B by PCR NEGATIVE NEGATIVE Final    Comment: (NOTE) The Xpert Xpress SARS-CoV-2/FLU/RSV plus assay is intended as an aid in the diagnosis of influenza from Nasopharyngeal swab specimens and should not be used as a sole basis for treatment. Nasal washings and aspirates are unacceptable for Xpert Xpress SARS-CoV-2/FLU/RSV testing.  Fact Sheet for Patients: EntrepreneurPulse.com.au  Fact Sheet for Healthcare  Providers: IncredibleEmployment.be  This test is not yet approved or cleared by the [place] FDA and has been authorized for detection and/or diagnosis of SARS-CoV-2 by FDA under an Emergency Use Authorization (EUA). This EUA will remain  in effect (meaning this test can be used) for the duration of the COVID-19 declaration under Section 564(b)(1) of the Act, 21 U.S.C. section 360bbb-3(b)(1), unless the authorization is terminated or revoked.  Performed at [facility], [place] [address] [place], [place] [address]   Respiratory (~20 pathogens) panel by PCR     Status: Abnormal   Collection Time: 12/15/2021  6:22 PM   Specimen: Nasopharyngeal Swab; Respiratory  Result Value Ref Range Status   Adenovirus NOT DETECTED NOT DETECTED Final   Coronavirus 229E NOT DETECTED NOT DETECTED Final    Comment: (NOTE) The Coronavirus on the Respiratory Panel, DOES NOT test for the novel  Coronavirus (2019 nCoV)    Coronavirus HKU1 NOT DETECTED NOT DETECTED Final   Coronavirus NL63 NOT DETECTED NOT DETECTED Final   Coronavirus OC43 NOT DETECTED NOT DETECTED Final   Metapneumovirus DETECTED (A) NOT DETECTED Final   Rhinovirus / Enterovirus NOT DETECTED NOT DETECTED Final   Influenza A NOT DETECTED NOT DETECTED Final   Influenza B NOT DETECTED NOT DETECTED Final   Parainfluenza Virus 1 NOT DETECTED NOT DETECTED Final   Parainfluenza Virus 2 NOT DETECTED NOT DETECTED Final   Parainfluenza Virus 3 NOT DETECTED NOT DETECTED Final   Parainfluenza Virus 4 NOT DETECTED NOT DETECTED Final   Respiratory Syncytial Virus NOT DETECTED NOT DETECTED Final   Bordetella pertussis NOT DETECTED NOT DETECTED Final   Bordetella Parapertussis NOT DETECTED NOT DETECTED Final   Chlamydophila pneumoniae NOT DETECTED NOT DETECTED Final   Mycoplasma pneumoniae NOT DETECTED NOT DETECTED Final    Comment: Performed at [facility], [address] [place], [place] [address]  Urine Culture      Status: Abnormal   Collection Time: 12/21/2021  6:48 PM   Specimen: In/Out Cath Urine  Result Value Ref Range Status   Specimen Description IN/OUT CATH URINE  Final   Special Requests   Final    Normal Performed at [facility], [place] [address] [place], [place] [address]    Culture >=100,000 COLONIES/mL ESCHERICHIA COLI (A)  Final   Report Status 12/11/2021 FINAL  Final   Organism ID, Bacteria ESCHERICHIA COLI (A)  Final      Susceptibility   Escherichia coli - MIC*    AMPICILLIN <=2 SENSITIVE Sensitive     CEFAZOLIN <=4 SENSITIVE Sensitive     CEFEPIME <=0.12 SENSITIVE Sensitive     CEFTRIAXONE <=0.25 SENSITIVE Sensitive     CIPROFLOXACIN <=0.25 SENSITIVE Sensitive     GENTAMICIN <=1 SENSITIVE Sensitive     IMIPENEM <=0.25 SENSITIVE Sensitive     NITROFURANTOIN <=16 SENSITIVE Sensitive     TRIMETH/SULFA <=20 SENSITIVE Sensitive     AMPICILLIN/SULBACTAM <=2 SENSITIVE Sensitive     PIP/TAZO <=4 SENSITIVE Sensitive     * >=100,000 COLONIES/mL ESCHERICHIA COLI  MRSA Next Gen by PCR, Nasal     Status: None   Collection Time: 12/09/21  7:39 PM   Specimen: Nasal Mucosa; Nasal Swab  Result Value Ref Range Status   MRSA by PCR Next Gen NOT DETECTED NOT DETECTED Final    Comment: (NOTE) The GeneXpert MRSA Assay (FDA approved for NASAL specimens only), is one component of a comprehensive MRSA colonization surveillance program. It is not intended to diagnose MRSA infection nor to guide or monitor treatment for MRSA infections. Test performance is not FDA approved in patients less than 25 years old. Performed at [facility], [place] [address], [place], [place] [address]  C Difficile Quick Screen w PCR reflex     Status: None   Collection Time: 12/10/21  2:05 PM   Specimen: STOOL  Result Value Ref Range Status   C Diff antigen NEGATIVE NEGATIVE Final   C Diff toxin NEGATIVE NEGATIVE Final   C Diff interpretation No C. difficile detected.  Final    Comment: Performed at  [facility], [place] [address] [place], [place] [address]         Radiology Studies: DG Chest 1 View  Result Date: 12/12/2021 CLINICAL DATA:  Shortness of breath EXAM: PORTABLE CHEST 1 VIEW COMPARISON:  12/10/2021 FINDINGS: Cardiac shadow is stable. Aortic calcifications are again seen. Feeding catheter is now seen extending into the stomach. Right PICC is noted extending to the cavoatrial junction stable in appearance. Patchy airspace opacities are noted bilaterally left worse than right similar to that seen on the prior exam. No acute bony abnormality is noted. IMPRESSION: Stable airspace opacities bilaterally. Tubes and lines as described. Electronically Signed   By: Inez Catalina M.D.   On: 12/12/2021 00:31   DG Abd Portable 1V  Result Date: 12/10/2021 CLINICAL DATA:  Feeding tube placement EXAM: PORTABLE ABDOMEN - 1 VIEW COMPARISON:  None. FINDINGS: Enteric tube passes into the stomach with tip overlying the distal stomach. IMPRESSION: Enteric tube within the distal stomach. Electronically Signed   By: Macy Mis M.D.   On: 12/10/2021 15:56        Scheduled Meds:  Chlorhexidine Gluconate Cloth  6 each Topical Daily   cyanocobalamin  1,000 mcg Subcutaneous Daily   Followed by   Derrill Memo ON 12/17/2021] cyanocobalamin  1,000 mcg Subcutaneous Weekly   feeding supplement (PROSource TF)  45 mL Per Tube Daily   free water  150 mL Per Tube Q6H   insulin aspart  0-9 Units Subcutaneous Q4H   insulin aspart  4 Units Subcutaneous Q4H   insulin glargine-yfgn  5 Units Subcutaneous Daily   ipratropium-albuterol  3 mL Nebulization BID   latanoprost  1 drop Both Eyes QHS   metoprolol tartrate  25 mg Oral BID   phosphorus  500 mg Per Tube TID   warfarin  2.5 mg Oral ONCE-1600   Warfarin - Pharmacist Dosing Inpatient   Does not apply q1600   Continuous Infusions:  ceFEPime (MAXIPIME) IV 2 g (12/12/21 0823)   feeding supplement (OSMOLITE 1.2 CAL) 65 mL/hr at 12/12/21 0037     LOS: 4  days      Phillips Climes, MD [facility]   To contact the attending provider between 7A-7P or the covering provider during after hours 7P-7A, please log into the web site [email or web address] and access using universal [place] password for that web site. If you do not have the password, please call the hospital operator.  12/12/2021, 1:40 PM

## 2021-12-12 NOTE — Progress Notes (Addendum)
This chaplain responded to PMT consult for spiritual care. The Pt. is awake and able to communicate, she is in pain and the source of the pain is the right hip.  The Pt. husband-[name] is at the bedside.  ? ?The chaplain remained with the Pt. and updated the [place] RNs of the Pt. pain. Pain medication was administered an hour later. The chaplain offered a listening presence with [name], but the Pt. was unable to participate in conversation with the chaplain. ? ?The chaplain will F/U with spiritual care. ? ?**1615 Pt. shares with the chaplain her pain has decreased. The chaplain and Pt. prayed together. ? ?Chaplain Sallyanne Kuster ?[phone] ? ? ?

## 2021-12-12 NOTE — Progress Notes (Signed)
ANTICOAGULATION CONSULT NOTE - Follow Up Consult ? ?Pharmacy Consult for Warfarin ?Indication: atrial fibrillation ? ?Allergies  ?Allergen Reactions  ? Ciprofloxacin Swelling  ?  Site of swelling not recalled  ? Lactose Intolerance (Gi) Nausea And Vomiting and Other (See Comments)  ?  severe stomach pain  ? Penicillins Hives and Itching  ?  "haven't used any since 1970's" - have gotten cephalosporins multiple times ?Has patient had a PCN reaction causing immediate rash, facial/tongue/throat swelling, SOB or lightheadedness with hypotension: Yes ?Has patient had a PCN reaction causing severe rash involving mucus membranes or skin necrosis: Unk ?Has patient had a PCN reaction that required hospitalization: Unk ?Has patient had a PCN reaction occurring within the last 10 years: No ?If all of the above answers are "NO", then may proceed with C  ? Sulfa Antibiotics Hives and Itching  ?  "haven't used any since 1970's"  ? Sulfasalazine Hives and Itching  ?  "haven't used any since 1970's"  ? Depakote [Divalproex Sodium] Other (See Comments)  ? Lisinopril Other (See Comments)  ?  Other reaction(s): associated with hyperkalemia  ? ? ?Patient Measurements: ?Height: 5' (152.4 cm) ?Weight: 62.8 kg (138 lb 7.2 oz) ?IBW/kg (Calculated) : 45.5 ? ?Vital Signs: ?Temp: 98.8 ?F (37.1 ?C) (03/10 1139) ?Temp Source: Oral (03/10 1139) ?BP: 163/57 (03/10 1139) ?Pulse Rate: 79 (03/10 1139) ? ?Labs: ?Recent Labs  ?  12/10/21 ?3244 12/10/21 ?0102 12/10/21 ?7253 12/11/21 ?6644 12/12/21 ?0401  ?HGB  --  10.8*   < > 10.5* 9.5*  ?HCT  --  33.5*  --  32.3* 29.2*  ?PLT  --  162  --  161 158  ?LABPROT  --  24.7*  --  27.7* 22.0*  ?INR  --  2.2*  --  2.6* 1.9*  ?CREATININE 0.76  --   --  0.84 0.81  ? < > = values in this interval not displayed.  ? ? ? ?Estimated Creatinine Clearance: 38.9 mL/min (by C-G formula based on SCr of 0.81 mg/dL). ? ?Assessment: ?31 yof with a history of DM2, CHF systolic diastolic chf, HDL, HTN, dementia. A.fib on  coumadin. Patient is presenting with AMS. Warfarin per pharmacy consult placed for atrial fibrillation. INR 1.4 on admit 3/6 and Lovenox bridge was added while INR subtherapeutic. ? ? INR 1.4 on admit > 2.2 on 3/8 and Lovenox stopped.  Amiodarone drip begun 3/7 and anticipated sensitivity to warfarin doses. INR 2.6 on 3/9 and warfarin 1.25 mg given. INR down to 1.9 today and Amiodarone drip stopped. ? ?PTA warfarin regimen: 2.5 mg on TuTh and 1.25 mg all other days.  ? - Last dose PTA on 3/5.  ? ?Goal of Therapy:  ?INR 2-3 ?Monitor platelets by anticoagulation protocol: Yes ?  ?Plan:  ?Warfarin 2.5 mg x 1 today. ?Daily PT/INR ? ?Arty Baumgartner, RPh ?12/12/2021,12:44 PM ? ? ?

## 2021-12-12 NOTE — Progress Notes (Signed)
Speech Language Pathology Treatment: Dysphagia  ?Patient Details ?Name: [name] [name] ?MRN: [id number] ?DOB: [birth year] ?Today's Date: 12/12/2021 ?Time: 0156-1537 ?SLP Time Calculation (min) (ACUTE ONLY): 16 min ? ?Assessment / Plan / Recommendation ?Clinical Impression ? SLP f/u this date per MD request to assess aspiration risk. Pt with increased RR and WOB with POs during session. Positioned pt upright in bed and provided ice chips via teaspoon with pt demonstrating active mastication and no overt s/sx of aspiration despite WOB and RR in mid 20s. Small volumes via cup also ok, with overt coughing post sip via straw and suspect airway compromise given suspected incoordination of breathing and swallowing. This incoordination appeared more significant with x2 successive bites of puree. Oral holding of bolus noted in order to breathe and RR in upper 30s-40. No overt s/sx of aspiration with puree texture. Would not recommend full PO diet at this time, but few ice chips and 1/4 teaspoon bites of puree OK after oral care when RR/WOB is stable. Recommend adherence to aspiration precautions given tenuous respiratory status. Above discussed with RN, MD, pt and her spouse. SLP to continue f/u. ?  ?HPI HPI: Patient is an 88 y.o. female with PMH: HDl, HTN, dementia, CHF, DM-2, afib on coumadin who presented to the hospital with confusion and generalized weakness as well as a fall on day of admission. Husband had reported that she did not hit her head. Patient diagnosed with severe sepsis. CXR (12/10/21) revealed Right perihilar airspace consolidation, as on 12/09/2021,  possibly due to pneumonia. She was weaned off BiPAP to nasal cannula on 3/8 and has been NPO since admission. ?  ?   ?SLP Plan ? Continue with current plan of care ? ?  ?  ?Recommendations for follow up therapy are one component of a multi-disciplinary discharge planning process, led by the attending physician.  Recommendations may be updated based on patient  status, additional functional criteria and insurance authorization. ?  ? ?Recommendations  ?Diet recommendations: NPO;Other(ice chips and 1/4 teaspoon bites puree when RR/WOB stable and after oral care) ?Liquids provided via: Teaspoon ?Medication Administration: Via alternative means ?Supervision: Full supervision/cueing for compensatory strategies;Staff to assist with self feeding ?Compensations: Slow rate;Small sips/bites ?Postural Changes and/or Swallow Maneuvers: Seated upright 90 degrees  ?   ?    ?   ? ? ? ? Oral Care Recommendations: Oral care QID;Staff/trained caregiver to provide oral care ?Follow Up Recommendations: Other (comment) (TBD) ?Assistance recommended at discharge: Frequent or constant Supervision/Assistance ?SLP Visit Diagnosis: Dysphagia, unspecified (R13.10) ?Plan: Continue with current plan of care ? ? ? ? ?  ?  ? ? ? ?[name] Dense, MA, CCC-SLP ?Acute Rehabilitation Services ?Office Number: [phone] ? ?[name] [name] ? ?12/12/2021, 2:16 PM ?

## 2021-12-12 NOTE — Progress Notes (Signed)
SLP Cancellation Note ? ?Patient Details ?Name: [name] [name] ?MRN: [id number] ?DOB: 09-04-1930 ? ? ?Cancelled treatment:       Reason Eval/Treat Not Completed: Medical issues which prohibited therapy. Per RN, pt with continued increased WOB and recommended f/u at later date. Will f/u as able for PO readiness.  ? ? ? ?Ellwood Dense, MA, CCC-SLP ?Acute Rehabilitation Services ?Office Number: [phone] ? ?Acie Fredrickson ?12/12/2021, 8:28 AM ?

## 2021-12-12 NOTE — Progress Notes (Signed)
Inpatient Diabetes Program Recommendations ? ?AACE/ADA: New Consensus Statement on Inpatient Glycemic Control  ? ?Target Ranges:  Prepandial:   less than 140 mg/dL ?     Peak postprandial:   less than 180 mg/dL (1-2 hours) ?     Critically ill patients:  140 - 180 mg/dL  ? ? Latest Reference Range & Units 12/12/21 03:49 12/12/21 07:31  ?Glucose-Capillary 70 - 99 mg/dL 244 (H) 291 (H)  ? ? Latest Reference Range & Units 12/11/21 08:11 12/11/21 12:18 12/11/21 15:57 12/11/21 19:46 12/11/21 23:13  ?Glucose-Capillary 70 - 99 mg/dL 248 (H) 208 (H) 221 (H) 203 (H) 237 (H)  ? ?Review of Glycemic Control ? ?Diabetes history: DM2 ?Outpatient Diabetes medications: Lantus 18 units QHS, Amaryl 4 mg QAM ?Current orders for Inpatient glycemic control: Semglee 5 units daily, Novolog 0-9 units Q4H; Osmolite @ 65 ml/hr ? ?Inpatient Diabetes Program Recommendations:   ? ?Insulin: Please consider ordering Novolog 4 units Q4H for tube feeding coverage. If tube feeding is stopped or held then Novolog tube feeding coverage should also be stopped or held. ? ?Thanks, ?Barnie Alderman, RN, MSN, CDE ?Diabetes Coordinator ?Inpatient Diabetes Program ?[phone] (Team Pager from 8am to 5pm) ? ? ? ?

## 2021-12-12 NOTE — Progress Notes (Addendum)
Physical Therapy Treatment ?Patient Details ?Name: [name] [name] ?MRN: [id number] ?DOB: 1930-10-01 ?Today's Date: 12/12/2021 ? ? ?History of Present Illness Pt is an 88 y.o. female who present to the ED with confusion and weakness folloing a fall at home. Admitted with a diagnosis of severe sepsis.  PMH:  DM2, CHF systolic diastolic chf, HDL, HTN, dementia, A.fib on coumadin ? ?  ?PT Comments  ? ? Pt received in bed on 5L O2, increased WOB RR in 40s with HOB at 40 degrees. Pt requesting HOB be lowered. Lowered to 32 degrees with RR improving to 26 and pt reporting improved comfort. Pt assisted to/from EOB +2 assist from nursing just prior to PT session.  Pt stating that she does not feel like she can do any more today. Able to complete a few LE exercises but then declining further due to fatigue. Pillows used for comfort with positioning in bed. PT to continue with mobility efforts as pt tolerates. ?  ?Recommendations for follow up therapy are one component of a multi-disciplinary discharge planning process, led by the attending physician.  Recommendations may be updated based on patient status, additional functional criteria and insurance authorization. ? ?Follow Up Recommendations ? Skilled nursing-short term rehab (<3 hours/day) ?  ?  ?Assistance Recommended at Discharge Frequent or constant Supervision/Assistance  ?Patient can return home with the following   ?  ?Equipment Recommendations ? None recommended by PT  ?  ?Recommendations for Other Services   ? ? ?  ?Precautions / Restrictions Precautions ?Precautions: Fall;Other (comment) ?Precaution Comments: monitor O2, RR, HR (a fib) ?Restrictions ?Weight Bearing Restrictions: No  ?  ? ?Mobility ? Bed Mobility ?Overal bed mobility: Needs Assistance ?Bed Mobility: Rolling ?Rolling: Total assist ?  ?  ?  ?  ?General bed mobility comments: Nursing assisted to/from EOB +2 assist just prior to PT session. RN reporting increased WOB with RR into 40s. ?   ? ?Transfers ?  ?  ?  ?  ?  ?  ?  ?  ?  ?General transfer comment: unable ?  ? ?Ambulation/Gait ?  ?  ?  ?  ?  ?  ?  ?General Gait Details: unable ? ? ?Stairs ?  ?  ?  ?  ?  ? ? ?Wheelchair Mobility ?  ? ?Modified Rankin (Stroke Patients Only) ?  ? ? ?  ?Balance   ?  ?  ?  ?  ?  ?  ?  ?  ?  ?  ?  ?  ?  ?  ?  ?  ?  ?  ?  ? ?  ?Cognition Arousal/Alertness: Awake/alert ?Behavior During Therapy: [facility] for tasks assessed/performed ?Overall Cognitive Status: No family/caregiver present to determine baseline cognitive functioning ?Area of Impairment: Attention, Following commands, Memory, Safety/judgement, Awareness, Problem solving ?  ?  ?  ?  ?  ?  ?  ?  ?  ?Current Attention Level: Sustained ?Memory: Decreased short-term memory ?Following Commands: Follows one step commands consistently ?Safety/Judgement: Decreased awareness of deficits ?Awareness: Intellectual ?Problem Solving: Slow processing, Difficulty sequencing, Requires verbal cues, Requires tactile cues ?  ?  ?  ? ?  ?Exercises General Exercises - Lower Extremity ?Ankle Circles/Pumps: AROM, Both, 5 reps, Supine ?Heel Slides: AAROM, Right, Left, 5 reps, Supine ? ?  ?General Comments General comments (skin integrity, edema, etc.): Pt on 5L via [place]. SpO2 93%. RR 26-47. Increased WOB at rest. ?  ?  ? ?Pertinent Vitals/Pain Pain Assessment ?Pain Assessment: No/denies  pain  ? ? ?Home Living   ?  ?  ?  ?  ?  ?  ?  ?  ?  ?   ?  ?Prior Function    ?  ?  ?   ? ?PT Goals (current goals can now be found in the care plan section) Acute Rehab PT Goals ?Patient Stated Goal: not stated ?Progress towards PT goals: Not progressing toward goals - comment (respiratory status) ? ?  ?Frequency ? ? ? Min 2X/week ? ? ? ?  ?PT Plan Current plan remains appropriate  ? ? ?Co-evaluation   ?  ?  ?  ?  ? ?  ?AM-PAC PT "6 Clicks" Mobility   ?Outcome Measure ? Help needed turning from your back to your side while in a flat bed without using bedrails?: Total ?Help needed moving from lying on your  back to sitting on the side of a flat bed without using bedrails?: Total ?Help needed moving to and from a bed to a chair (including a wheelchair)?: Total ?Help needed standing up from a chair using your arms (e.g., wheelchair or bedside chair)?: Total ?Help needed to walk in hospital room?: Total ?Help needed climbing 3-5 steps with a railing? : Total ?6 Click Score: 6 ? ?  ?End of Session Equipment Utilized During Treatment: Oxygen ?Activity Tolerance: Treatment limited secondary to medical complications (Comment);Patient limited by fatigue (increased WOB, increased RR) ?Patient left: in bed;with call bell/phone within reach;with bed alarm set ?Nurse Communication: Mobility status ?PT Visit Diagnosis: Other abnormalities of gait and mobility (R26.89);Muscle weakness (generalized) (M62.81) ?  ? ? ?Time: 2440-1027 ?PT Time Calculation (min) (ACUTE ONLY): 10 min ? ?Charges:  $Therapeutic Activity: 8-22 mins          ?          ? ?Lorrin Goodell, PT  ?Office # [phone] ?Pager [phone] ? ? ? ?[name] [name] ?12/12/2021, 11:39 AM ? ?

## 2021-12-13 DIAGNOSIS — G9340 Encephalopathy, unspecified: Secondary | ICD-10-CM | POA: Diagnosis not present

## 2021-12-13 DIAGNOSIS — F0390 Unspecified dementia without behavioral disturbance: Secondary | ICD-10-CM

## 2021-12-13 DIAGNOSIS — A419 Sepsis, unspecified organism: Secondary | ICD-10-CM | POA: Diagnosis not present

## 2021-12-13 DIAGNOSIS — J9601 Acute respiratory failure with hypoxia: Secondary | ICD-10-CM | POA: Diagnosis not present

## 2021-12-13 DIAGNOSIS — I4891 Unspecified atrial fibrillation: Secondary | ICD-10-CM | POA: Diagnosis not present

## 2021-12-13 LAB — CBC
HCT: 26.7 % — ABNORMAL LOW (ref 36.0–46.0)
Hemoglobin: 8.9 g/dL — ABNORMAL LOW (ref 12.0–15.0)
MCH: 27.5 pg (ref 26.0–34.0)
MCHC: 33.3 g/dL (ref 30.0–36.0)
MCV: 82.4 fL (ref 80.0–100.0)
Platelets: 188 10*3/uL (ref 150–400)
RBC: 3.24 MIL/uL — ABNORMAL LOW (ref 3.87–5.11)
RDW: 15.9 % — ABNORMAL HIGH (ref 11.5–15.5)
WBC: 11.9 10*3/uL — ABNORMAL HIGH (ref 4.0–10.5)
nRBC: 0.2 % (ref 0.0–0.2)

## 2021-12-13 LAB — PROTIME-INR
INR: 1.6 — ABNORMAL HIGH (ref 0.8–1.2)
Prothrombin Time: 18.7 seconds — ABNORMAL HIGH (ref 11.4–15.2)

## 2021-12-13 LAB — CULTURE, BLOOD (ROUTINE X 2): Culture: NO GROWTH

## 2021-12-13 LAB — BASIC METABOLIC PANEL
Anion gap: 9 (ref 5–15)
BUN: 26 mg/dL — ABNORMAL HIGH (ref 8–23)
CO2: 25 mmol/L (ref 22–32)
Calcium: 7.8 mg/dL — ABNORMAL LOW (ref 8.9–10.3)
Chloride: 99 mmol/L (ref 98–111)
Creatinine, Ser: 1 mg/dL (ref 0.44–1.00)
GFR, Estimated: 54 mL/min — ABNORMAL LOW (ref >60)
Glucose, Bld: 155 mg/dL — ABNORMAL HIGH (ref 70–99)
Potassium: 4.9 mmol/L (ref 3.5–5.1)
Sodium: 133 mmol/L — ABNORMAL LOW (ref 135–145)

## 2021-12-13 LAB — GLUCOSE, CAPILLARY
Glucose-Capillary: 138 mg/dL — ABNORMAL HIGH (ref 70–99)
Glucose-Capillary: 222 mg/dL — ABNORMAL HIGH (ref 70–99)
Glucose-Capillary: 193 mg/dL — ABNORMAL HIGH (ref 70–99)

## 2021-12-13 LAB — PHOSPHORUS: Phosphorus: 4.3 mg/dL (ref 2.5–4.6)

## 2021-12-13 LAB — MAGNESIUM: Magnesium: 1.9 mg/dL (ref 1.7–2.4)

## 2021-12-13 MED ORDER — AMIODARONE HCL IN DEXTROSE 360-4.14 MG/200ML-% IV SOLN
60.0000 mg/h | INTRAVENOUS | Status: DC
  Administered 2021-12-13: 60 mg/h via INTRAVENOUS
  Filled 2021-12-13: qty 200

## 2021-12-13 MED ORDER — AMIODARONE HCL 200 MG PO TABS: 200.0000 mg | ORAL_TABLET | Freq: Every day | ORAL | Status: DC

## 2021-12-13 MED ORDER — WARFARIN SODIUM 3 MG PO TABS: 3.0000 mg | ORAL_TABLET | Freq: Once | ORAL | Status: DC

## 2021-12-13 MED ORDER — MORPHINE SULFATE (PF) 2 MG/ML IV SOLN: 1.0000 mg | INTRAVENOUS | Status: DC | PRN

## 2021-12-13 MED ORDER — DILTIAZEM HCL 25 MG/5ML IV SOLN
10.0000 mg | Freq: Once | INTRAVENOUS | Status: AC
  Administered 2021-12-13: 10 mg via INTRAVENOUS
  Filled 2021-12-13: qty 5

## 2021-12-13 MED ORDER — AMIODARONE HCL 200 MG PO TABS
200.0000 mg | ORAL_TABLET | Freq: Two times a day (BID) | ORAL | Status: DC
  Administered 2021-12-13: 200 mg via ORAL
  Filled 2021-12-13: qty 1

## 2021-12-13 MED ORDER — AMIODARONE LOAD VIA INFUSION
150.0000 mg | Freq: Once | INTRAVENOUS | Status: AC
Start: 2021-12-13 — End: 2021-12-13
  Administered 2021-12-13: 150 mg via INTRAVENOUS
  Filled 2021-12-13: qty 83.34

## 2021-12-13 MED ORDER — AMIODARONE HCL IN DEXTROSE 360-4.14 MG/200ML-% IV SOLN
30.0000 mg/h | INTRAVENOUS | Status: DC
Start: 2021-12-13 — End: 2021-12-13

## 2021-12-13 MED ORDER — SODIUM CHLORIDE 0.9 % IV SOLN
1.0000 g | INTRAVENOUS | Status: DC
  Administered 2021-12-13: 1 g via INTRAVENOUS
  Filled 2021-12-13: qty 10

## 2022-01-03 NOTE — Progress Notes (Signed)
RT note-Attempted to give nebulizer to patient this morning and RN requested that the patient sleeping. Repeated visit to assess for need of treatment and now the patient's HR 140's, no treatment given at this time. Continue to monitor. ?

## 2022-01-03 NOTE — Progress Notes (Signed)
PROGRESS NOTE    [name] [name]  QBV:[id number] DOB: 01-11-1930 DOA: 12/12/2021 PCP: Donnajean Lopes, MD   Chief Complaint  Patient presents with   Altered Mental Status    Brief Narrative:   [name] [name] is a 88 y.o. female with medical history significant of    DM2, CHF systolic diastolic chf, HDL, HTN, dementia. A.fib on coumadin  ED secondary for generalized weakness, confusion, she was reported noted to be febrile, hypoglycemic, her work-up was significant for sepsis and volume overload, as well she is in respiratory distress where she required BiPAP. -She was treated with broad-spectrum antibiotic for sepsis, as well D5 for hypoglycemia, sepsis has improved, respiratory status has improved as well where she is currently off BiPAP, she is still having faculty swallowing where she was started on cortrak tube feed. -Patient was treated with broad-spectrum antibiotics, diuresed as needed, she was started on [place], she was seen by SLP where she has performed poorly, kept going in and out of A-fib despite the blockers and amiodarone drip, overall weak, deconditioned, significantly demented and debilitated, with no improvement, this morning patient with increased work of breathing, again in A-fib with RVR, significantly hypoxic requiring NRB, she appears to be with agonal breathing, discussed with husband at bedside, at this point goals is for comfort.   Assessment & Plan:   Principal Problem:   Severe sepsis ([place]) Active Problems:   UTI (urinary tract infection)   DM II (diabetes mellitus, type II), controlled (HCC)   HTN (hypertension)   Elevated troponin   Pulmonary edema   Hypercholesteremia   Atrial fibrillation    COPD (chronic obstructive pulmonary disease) (HCC)   Acute encephalopathy   Hyponatremia   CAP (community acquired pneumonia)   Acute respiratory failure with hypoxia (HCC)   Malnutrition of moderate degree  Sepsis present on admission likely due  to urinary source/UTI and possible pneumonia -Sepsis present on admission with lactic acid of 2.5, source is urinary, as well with under working diagnosis of multifocal pneumonia -He was treated with broad-spectrum antibiotics, has been discontinued currently now she is comfort. -1/4 blood culture growing staph epi, this is likely contamination, continue to monitor off vancomycin  Acute Hypoxic respiratory failure due to to pneumonia, likely viral pneumonia with superimposed bacterial pneumonia. -requiring  BiPAP initially, respiratory status has improved, she is on nasal cannula this morning. -Chest x-ray initially significant for volume overload, repeat x-ray this morning with evolving right upper lobe pneumonia, will need to repeat imaging after appropriate diuresis -Negative Negative Legionella, strep pneumonia antigen -Respiratory panel growing metapneumovirus -Likely with superimposed bacterial pneumonia as well -He is on NRB this morning with increased work of breathing, currently comfort measures.  Urinary tract infection -Urine cultures growing E. coli.  Hypokalemia/Hypophosphatemia/Hypomagnesemia -Repleted aggressively.  acute on chronic CHF, unspecified -2D echo is pending, but normal EF and echo 2015 -She received 40 mg of IV Lasix yesterday without much improvement of respiratory status  Hyponatremia -Improving  Normocytic anemia -B12 is borderline low,  started on IM supplements.  Acute metabolic encephalopathy -Likely due to infection -No focal deficits -Remains with significant dysphagia, failed swallow evaluation remains on NG tube.  Dysphagia -She failed swallow evaluation yesterday  Diabetes mellitus, type II, poorly controlled with hyperglycemia -Hypoglycemic on presentation, he is requiring insulin after her tube feed  A-fib with RVR -Heart rate uncontrolled, initially on Cardizem, but blood pressure has been soft so she has been transitioned to  amiodarone drip -Continue with home dose metoprolol -INR  is therapeutic on warfarin. -Back in A-fib with RVR this morning, back on either on drip, she received IV Cardizem as well, this all will be stopped now she is comfort care.  Hypertension -Initially blood pressure is soft, this has improved,  Hyperlipidemia -Statin Dementia -Continue with supportive care   Goals of  care discussion: -Patient remains significantly weak, altered, frail, unable to pass swallow evaluation, respiratory status remains very tenuous despite appropriate antibiotic treatment and diuresis, she is significantly tachypneic with NRB this morning, as well her A-fib back in RVR, she remains with dysphagia, unable to pass SLP, she is with dementia at baseline, husband at bedside, discussed at length, he understand her poor prognosis, at this point goal is to focus on patient comfort, his questions were answered, and he is very appreciated for the care she got at the hospital.     DVT prophylaxis: on warfarin  Code Status: DNR>> comfort  Family Communication: Discussed with husband at bedside.   Disposition:   Expected hospital death   Consultants:  Cardiolgoy Palliative  Subjective:  Patient is obtunded this morning, unable to provide any complaints  Objective: Vitals:   10-Jan-2022 0400 01-10-2022 0835 10-Jan-2022 0940 2022/01/10 1040  BP: (!) 111/49 128/69 131/63 (!) 140/54  Pulse: 88 (!) 118 (!) 120 (!) 129  Resp: '15 20 20   '$ Temp:      TempSrc:      SpO2: 94% 93% 92% 93%  Weight: 61.2 kg     Height:        Intake/Output Summary (Last 24 hours) at 01-10-2022 1200 Last data filed at 01-10-22 9476 Gross per 24 hour  Intake --  Output 1700 ml  Net -1700 ml   Filed Weights   12/11/21 0500 12/12/21 0106 01/10/2022 0400  Weight: 60 kg 62.8 kg 61.2 kg    Examination:   Obtundent, unresponsive, ill-appearing  Diminished air entry with scattered Rales and rhonchi bilaterally tachypneic with  significant use of accessory muscles. Irregular irregular, tachycardic  Abdomen soft, bowel sounds present, nontender  Extremities with no edema or clubbing.        Data Reviewed: I have personally reviewed following labs and imaging studies  CBC: Recent Labs  Lab 12/31/2021 1622 12/23/2021 1638 12/28/2021 2300 12/09/21 0224 12/09/21 0810 12/10/21 0643 12/11/21 0239 12/12/21 0401 2022/01/10 0336  WBC 13.1*  --  8.2 8.5  --  7.4 7.8 10.3 11.9*  NEUTROABS 11.1*  --  6.4 6.4  --   --   --   --   --   HGB 11.6*   < > 9.0* 11.2* 10.2* 10.8* 10.5* 9.5* 8.9*  HCT 38.0   < > 28.7* 34.8* 30.0* 33.5* 32.3* 29.2* 26.7*  MCV 87.2  --  85.7 85.5  --  83.3 83.5 84.4 82.4  PLT PLATELET CLUMPS NOTED ON SMEAR, COUNT APPEARS ADEQUATE  --  150 145*  --  162 161 158 188   < > = values in this interval not displayed.    Basic Metabolic Panel: Recent Labs  Lab 12/09/21 1811 12/10/21 0519 12/10/21 1709 12/11/21 0239 12/11/21 1637 12/12/21 0401 10-Jan-2022 0336  NA 128* 127*  --  130*  --  130* 133*  K 4.7 3.9  --  3.7  --  3.9 4.9  CL 95* 96*  --  100  --  99 99  CO2 19* 20*  --  23  --  22 25  GLUCOSE 76 73  --  184*  --  256* 155*  BUN 11 10  --  15  --  20 26*  CREATININE 0.78 0.76  --  0.84  --  0.81 1.00  CALCIUM 7.8* 7.5*  --  7.7*  --  7.8* 7.8*  MG 1.9 2.3 2.3 2.3 2.1 2.0 1.9  PHOS 2.3* 4.1 2.9 2.3* 1.9* 2.5 4.3    GFR: Estimated Creatinine Clearance: 31.2 mL/min (by C-G formula based on SCr of 1 mg/dL).  Liver Function Tests: Recent Labs  Lab 12/09/2021 1622 12/20/2021 2300 12/09/21 0204 12/10/21 0519  AST 48* 41 51* 59*  ALT '19 15 16 19  '$ ALKPHOS 60 46 46 43  BILITOT 0.7 0.5 0.5 0.4  PROT 6.9 5.6* 6.0* 5.9*  ALBUMIN 3.5 2.6* 3.0* 2.7*    CBG: Recent Labs  Lab 12/12/21 2018 12/12/21 2319 12/22/2021 0317 Dec 22, 2021 0655 December 22, 2021 0803  GLUCAP 219* 149* 138* 222* 193*     Recent Results (from the past 240 hour(s))  Blood Cultures (routine x 2)     Status: None    Collection Time: 12/24/2021  2:35 PM   Specimen: BLOOD  Result Value Ref Range Status   Specimen Description BLOOD LEFT ANTECUBITAL  Final   Special Requests   Final    BOTTLES DRAWN AEROBIC AND ANAEROBIC Blood Culture results may not be optimal due to an inadequate volume of blood received in culture bottles   Culture   Final    NO GROWTH 5 DAYS Performed at [facility], [place] [address] [place], [place] [address]    Report Status 2021-12-22 FINAL  Final  Blood Cultures (routine x 2)     Status: Abnormal   Collection Time: 12/10/2021  2:40 PM   Specimen: BLOOD LEFT FOREARM  Result Value Ref Range Status   Specimen Description BLOOD LEFT FOREARM  Final   Special Requests   Final    BOTTLES DRAWN AEROBIC AND ANAEROBIC Blood Culture adequate volume   Culture  Setup Time   Final    GRAM POSITIVE COCCI AEROBIC BOTTLE ONLY CRITICAL RESULT CALLED TO, READ BACK BY AND VERIFIED WITH: PHARMD T. DANG [id number] '@1729'$  FH    Culture (A)  Final    STAPHYLOCOCCUS EPIDERMIDIS STAPHYLOCOCCUS HAEMOLYTICUS THE SIGNIFICANCE OF ISOLATING THIS ORGANISM FROM A SINGLE SET OF BLOOD CULTURES WHEN MULTIPLE SETS ARE DRAWN IS UNCERTAIN. PLEASE NOTIFY THE MICROBIOLOGY DEPARTMENT WITHIN ONE WEEK IF SPECIATION AND SENSITIVITIES ARE REQUIRED. CRITICAL RESULT CALLED TO, READ BACK BY AND VERIFIED WITH: PHARMD EMMA U. [id number] FCP Performed at [facility], [place] [address], [place], [place] [address]    Report Status 12/11/2021 FINAL  Final  Blood Culture ID Panel (Reflexed)     Status: Abnormal   Collection Time: 12/03/2021  2:40 PM  Result Value Ref Range Status   Enterococcus faecalis NOT DETECTED NOT DETECTED Final   Enterococcus Faecium NOT DETECTED NOT DETECTED Final   Listeria monocytogenes NOT DETECTED NOT DETECTED Final   Staphylococcus species DETECTED (A) NOT DETECTED Final    Comment: CRITICAL RESULT CALLED TO, READ BACK BY AND VERIFIED WITH: PHARMD T. DANG [id number] '@1729'$  FH    Staphylococcus  aureus (BCID) NOT DETECTED NOT DETECTED Final   Staphylococcus epidermidis DETECTED (A) NOT DETECTED Final    Comment: Methicillin (oxacillin) resistant coagulase negative staphylococcus. Possible blood culture contaminant (unless isolated from more than one blood culture draw or clinical case suggests pathogenicity). No antibiotic treatment is indicated for blood  culture contaminants. CRITICAL RESULT CALLED TO, READ BACK  BY AND VERIFIED WITH: PHARMD T. DANG [id number] '@1729'$  FH    Staphylococcus lugdunensis NOT DETECTED NOT DETECTED Final   Streptococcus species NOT DETECTED NOT DETECTED Final   Streptococcus agalactiae NOT DETECTED NOT DETECTED Final   Streptococcus pneumoniae NOT DETECTED NOT DETECTED Final   Streptococcus pyogenes NOT DETECTED NOT DETECTED Final   A.calcoaceticus-baumannii NOT DETECTED NOT DETECTED Final   Bacteroides fragilis NOT DETECTED NOT DETECTED Final   Enterobacterales NOT DETECTED NOT DETECTED Final   Enterobacter cloacae complex NOT DETECTED NOT DETECTED Final   Escherichia coli NOT DETECTED NOT DETECTED Final   Klebsiella aerogenes NOT DETECTED NOT DETECTED Final   Klebsiella oxytoca NOT DETECTED NOT DETECTED Final   Klebsiella pneumoniae NOT DETECTED NOT DETECTED Final   Proteus species NOT DETECTED NOT DETECTED Final   Salmonella species NOT DETECTED NOT DETECTED Final   Serratia marcescens NOT DETECTED NOT DETECTED Final   Haemophilus influenzae NOT DETECTED NOT DETECTED Final   Neisseria meningitidis NOT DETECTED NOT DETECTED Final   Pseudomonas aeruginosa NOT DETECTED NOT DETECTED Final   Stenotrophomonas maltophilia NOT DETECTED NOT DETECTED Final   Candida albicans NOT DETECTED NOT DETECTED Final   Candida auris NOT DETECTED NOT DETECTED Final   Candida glabrata NOT DETECTED NOT DETECTED Final   Candida krusei NOT DETECTED NOT DETECTED Final   Candida parapsilosis NOT DETECTED NOT DETECTED Final   Candida tropicalis NOT DETECTED NOT DETECTED Final    Cryptococcus neoformans/gattii NOT DETECTED NOT DETECTED Final   Methicillin resistance mecA/C DETECTED (A) NOT DETECTED Final    Comment: CRITICAL RESULT CALLED TO, READ BACK BY AND VERIFIED WITH: PHARMD T. Mina Marble [id number] '@1729'$  FH Performed at [place] [address] [place], [place] [address]   Resp Panel by RT-PCR (Flu A&B, Covid) Peripheral     Status: None   Collection Time: 12/19/2021  6:22 PM   Specimen: Peripheral; Nasopharyngeal(NP) swabs in vial transport medium  Result Value Ref Range Status   SARS Coronavirus 2 by RT PCR NEGATIVE NEGATIVE Final    Comment: (NOTE) SARS-CoV-2 target nucleic acids are NOT DETECTED.  The SARS-CoV-2 RNA is generally detectable in upper respiratory specimens during the acute phase of infection. The lowest concentration of SARS-CoV-2 viral copies this assay can detect is 138 copies/mL. A negative result does not preclude SARS-Cov-2 infection and should not be used as the sole basis for treatment or other patient management decisions. A negative result may occur with  improper specimen collection/handling, submission of specimen other than nasopharyngeal swab, presence of viral mutation(s) within the areas targeted by this assay, and inadequate number of viral copies(<138 copies/mL). A negative result must be combined with clinical observations, patient history, and epidemiological information. The expected result is Negative.  Fact Sheet for Patients:  EntrepreneurPulse.com.au  Fact Sheet for Healthcare Providers:  IncredibleEmployment.be  This test is no t yet approved or cleared by the [place] FDA and  has been authorized for detection and/or diagnosis of SARS-CoV-2 by FDA under an Emergency Use Authorization (EUA). This EUA will remain  in effect (meaning this test can be used) for the duration of the COVID-19 declaration under Section 564(b)(1) of the Act, 21 U.S.C.section 360bbb-3(b)(1),  unless the authorization is terminated  or revoked sooner.       Influenza A by PCR NEGATIVE NEGATIVE Final   Influenza B by PCR NEGATIVE NEGATIVE Final    Comment: (NOTE) The Xpert Xpress SARS-CoV-2/FLU/RSV plus assay is intended as an aid in the diagnosis of influenza  from Nasopharyngeal swab specimens and should not be used as a sole basis for treatment. Nasal washings and aspirates are unacceptable for Xpert Xpress SARS-CoV-2/FLU/RSV testing.  Fact Sheet for Patients: EntrepreneurPulse.com.au  Fact Sheet for Healthcare Providers: IncredibleEmployment.be  This test is not yet approved or cleared by the [place] FDA and has been authorized for detection and/or diagnosis of SARS-CoV-2 by FDA under an Emergency Use Authorization (EUA). This EUA will remain in effect (meaning this test can be used) for the duration of the COVID-19 declaration under Section 564(b)(1) of the Act, 21 U.S.C. section 360bbb-3(b)(1), unless the authorization is terminated or revoked.  Performed at [facility], [place] [address], [place], [place] [address]   Respiratory (~20 pathogens) panel by PCR     Status: Abnormal   Collection Time: 12/09/2021  6:22 PM   Specimen: Nasopharyngeal Swab; Respiratory  Result Value Ref Range Status   Adenovirus NOT DETECTED NOT DETECTED Final   Coronavirus 229E NOT DETECTED NOT DETECTED Final    Comment: (NOTE) The Coronavirus on the Respiratory Panel, DOES NOT test for the novel  Coronavirus (2019 nCoV)    Coronavirus HKU1 NOT DETECTED NOT DETECTED Final   Coronavirus NL63 NOT DETECTED NOT DETECTED Final   Coronavirus OC43 NOT DETECTED NOT DETECTED Final   Metapneumovirus DETECTED (A) NOT DETECTED Final   Rhinovirus / Enterovirus NOT DETECTED NOT DETECTED Final   Influenza A NOT DETECTED NOT DETECTED Final   Influenza B NOT DETECTED NOT DETECTED Final   Parainfluenza Virus 1 NOT DETECTED NOT DETECTED Final    Parainfluenza Virus 2 NOT DETECTED NOT DETECTED Final   Parainfluenza Virus 3 NOT DETECTED NOT DETECTED Final   Parainfluenza Virus 4 NOT DETECTED NOT DETECTED Final   Respiratory Syncytial Virus NOT DETECTED NOT DETECTED Final   Bordetella pertussis NOT DETECTED NOT DETECTED Final   Bordetella Parapertussis NOT DETECTED NOT DETECTED Final   Chlamydophila pneumoniae NOT DETECTED NOT DETECTED Final   Mycoplasma pneumoniae NOT DETECTED NOT DETECTED Final    Comment: Performed at [facility], [address] [place], [place] [address]  Urine Culture     Status: Abnormal   Collection Time: 12/20/2021  6:48 PM   Specimen: In/Out Cath Urine  Result Value Ref Range Status   Specimen Description IN/OUT CATH URINE  Final   Special Requests   Final    Normal Performed at [facility], [place] [address] [place], [place] [address]    Culture >=100,000 COLONIES/mL ESCHERICHIA COLI (A)  Final   Report Status 12/11/2021 FINAL  Final   Organism ID, Bacteria ESCHERICHIA COLI (A)  Final      Susceptibility   Escherichia coli - MIC*    AMPICILLIN <=2 SENSITIVE Sensitive     CEFAZOLIN <=4 SENSITIVE Sensitive     CEFEPIME <=0.12 SENSITIVE Sensitive     CEFTRIAXONE <=0.25 SENSITIVE Sensitive     CIPROFLOXACIN <=0.25 SENSITIVE Sensitive     GENTAMICIN <=1 SENSITIVE Sensitive     IMIPENEM <=0.25 SENSITIVE Sensitive     NITROFURANTOIN <=16 SENSITIVE Sensitive     TRIMETH/SULFA <=20 SENSITIVE Sensitive     AMPICILLIN/SULBACTAM <=2 SENSITIVE Sensitive     PIP/TAZO <=4 SENSITIVE Sensitive     * >=100,000 COLONIES/mL ESCHERICHIA COLI  MRSA Next Gen by PCR, Nasal     Status: None   Collection Time: 12/09/21  7:39 PM   Specimen: Nasal Mucosa; Nasal Swab  Result Value Ref Range Status   MRSA by PCR Next  Gen NOT DETECTED NOT DETECTED Final    Comment: (NOTE) The GeneXpert MRSA Assay (FDA approved for NASAL specimens only), is one component of a comprehensive MRSA colonization surveillance program.  It is not intended to diagnose MRSA infection nor to guide or monitor treatment for MRSA infections. Test performance is not FDA approved in patients less than 18 years old. Performed at [facility], [place] [address], [place], [place] [address]   C Difficile Quick Screen w PCR reflex     Status: None   Collection Time: 12/10/21  2:05 PM   Specimen: STOOL  Result Value Ref Range Status   C Diff antigen NEGATIVE NEGATIVE Final   C Diff toxin NEGATIVE NEGATIVE Final   C Diff interpretation No C. difficile detected.  Final    Comment: Performed at [facility], [place] [address] [place], [place] [address]         Radiology Studies: DG Chest 1 View  Result Date: 12/12/2021 CLINICAL DATA:  Shortness of breath EXAM: PORTABLE CHEST 1 VIEW COMPARISON:  12/10/2021 FINDINGS: Cardiac shadow is stable. Aortic calcifications are again seen. Feeding catheter is now seen extending into the stomach. Right PICC is noted extending to the cavoatrial junction stable in appearance. Patchy airspace opacities are noted bilaterally left worse than right similar to that seen on the prior exam. No acute bony abnormality is noted. IMPRESSION: Stable airspace opacities bilaterally. Tubes and lines as described. Electronically Signed   By: Inez Catalina M.D.   On: 12/12/2021 00:31        Scheduled Meds:  Chlorhexidine Gluconate Cloth  6 each Topical Daily   free water  150 mL Per Tube Q6H   ipratropium-albuterol  3 mL Nebulization BID   latanoprost  1 drop Both Eyes QHS   Continuous Infusions:     LOS: 5 days      Phillips Climes, MD [facility]   To contact the attending provider between 7A-7P or the covering provider during after hours 7P-7A, please log into the web site [email or web address] and access using universal [place] password for that web site. If you do not have the password, please call the hospital operator.  2021-12-24, 12:00 PM

## 2022-01-03 NOTE — Progress Notes (Signed)
Sent secure chat to Dr. Johnette Abraham since it shows he is available and Hosp for patient @ 915 regarding A-fib with RVR.  Explained that I administered Metoprolol and Amio PO already and would monitor.  Patient still tachy.  Called on Call hospitalist and left message for him to call me back. ?

## 2022-01-03 NOTE — Death Summary Note (Signed)
DEATH SUMMARY   Patient Details  Name: [name] [name] MRN: [id number] DOB: 1930-03-19 TGG:YIRSWNIO, Ermalene Searing, MD Admission/Discharge Information   Admit Date:  December 31, 2021  Date of Death: Date of Death: 01/05/2022  Time of Death: Time of Death: Jan 10, 1320  Length of Stay: 5   Principle Cause of death:   Severe sepsis  Hospital Diagnoses: Principal Problem:   Severe sepsis ([place]) Active Problems:   CAP (community acquired pneumonia)   UTI (urinary tract infection)   Acute encephalopathy  Chronic atrial fibrillation with RVR (HCC)  Acute respiratory failure with hypoxia (HCC)   End of life care   DM II (diabetes mellitus, type II), un controlled with hypoglycemia (HCC)   HTN (hypertension)   Elevated troponin   Pulmonary edema   Hypercholesteremia   COPD (chronic obstructive pulmonary disease) (HCC)   Hyponatremia   Dysphagia   Malnutrition of moderate degree   Dementia without behavioral disturbance [facility])   Hospital Course:  [name] [name] is a 88 y.o. female with medical history significant of  DM2, CHF systolic diastolic chf, HDL, HTN, dementia. A.fib on coumadin, she presents ED secondary for generalized weakness, confusion, she was reported noted to be febrile, hypoglycemic, her work-up was significant for severe sepsis and volume overload, as well she is in respiratory distress, significant hypoxia and increased work of breathing requiring BiPAP . -Sepsis was related to E. coli UTI, and pneumonia, viral with superimposed bacterial component , she was treated with IV antibiotics, which has been adjusted according to culture data, blood culture was contaminant with Staph epidermidis, but her urine was significant for E. coli which was treated with IV cefepime, respiratory status remains very tenuous, she required BiPAP for stay, but after that she was on 5 L nasal cannula, with increased work of breathing, tachypneic, as well she did develop A-fib with RVR where heart rate  was significantly uncontrolled where she was treated with amiodarone drip given the fact blood pressure cannot tolerate any Cardizem drip or beta-blockers, at one point she was resumed back on her p.o. metoprolol, and she was in and out of A-fib with RVR required to go back on amiodarone drip, she is with significant dementia, and altered mental status, unable to pass swallow evaluation for which NG tube was inserted and she was started on tube feed, unfortunately she failed multiple evaluations by swallow secondary to her dysphagia, through entire hospital stay patient respiratory status was tenuous, mentation was altered, between lethargic and confused, and she was in significant respiratory discomfort, secondary to pneumonia viral and bacterial, as well due to some volume overload, diuresis was limited due to her soft blood pressure, but even when she was able to receive some IV Lasix did not provide much help, patient overall prognosis was poor given multiple comorbidities, and poor functional status at baseline and dementia, where palliative medicine were consulted and it was clear to continue with medical management only, no heroics, and DNR CODE STATUS, this morning patient with increased work of breathing, again in A-fib with RVR, significantly hypoxic requiring NRB, she is much less responsive, impounded, significant respiratory discomfort, with agonal breathing, husband at bedside, cussed goals of care at this point, and poor prognosis, decision has been made to proceed with full comfort measure, she was kept on as needed morphine for dyspnea, patient passed away with husband and multiple family friends at bedside, and they were appreciated of the care she received during her hospital stay.  .   Assessment and Plan: No  notes have been filed under this hospital service. Service: Hospitalist        Procedures: cortrak insertion  Consultations:  Cardiology Palliative medicine  The results of  significant diagnostics from this hospitalization (including imaging, microbiology, ancillary and laboratory) are listed below for reference.   Significant Diagnostic Studies: DG Chest 1 View  Result Date: 12/12/2021 CLINICAL DATA:  Shortness of breath EXAM: PORTABLE CHEST 1 VIEW COMPARISON:  12/10/2021 FINDINGS: Cardiac shadow is stable. Aortic calcifications are again seen. Feeding catheter is now seen extending into the stomach. Right PICC is noted extending to the cavoatrial junction stable in appearance. Patchy airspace opacities are noted bilaterally left worse than right similar to that seen on the prior exam. No acute bony abnormality is noted. IMPRESSION: Stable airspace opacities bilaterally. Tubes and lines as described. Electronically Signed   By: Inez Catalina M.D.   On: 12/12/2021 00:31   DG Chest 1 View  Result Date: 12/11/2021 CLINICAL DATA:  Altered mental status. EXAM: CHEST  1 VIEW COMPARISON:  Prior chest radiographs 11/25/2018. FINDINGS: Heart size at the upper limits of normal, unchanged. Aortic atherosclerosis. Mild bilateral interstitial prominence suggesting interstitial edema. No definite airspace consolidation. No evidence of pleural effusion or pneumothorax. No acute bony abnormality identified. IMPRESSION: Prominence of the bilateral interstitial lung markings, suggesting interstitial edema. Atypical/viral pneumonia can also have this appearance and clinical correlation is recommended. Aortic Atherosclerosis (ICD10-I70.0). Redemonstrated prominent mitral annular calcifications. Electronically Signed   By: Kellie Simmering D.O.   On: 12/16/2021 15:12   CT HEAD WO CONTRAST  Result Date: 12/23/2021 CLINICAL DATA:  Mental status change, unknown cause. Additional history provided: Generalized weakness, altered mental status, fall. EXAM: CT HEAD WITHOUT CONTRAST TECHNIQUE: Contiguous axial images were obtained from the base of the skull through the vertex without intravenous contrast.  RADIATION DOSE REDUCTION: This exam was performed according to the departmental dose-optimization program which includes automated exposure control, adjustment of the mA and/or kV according to patient size and/or use of iterative reconstruction technique. COMPARISON:  Prior head CT examinations 11/26/2018 and earlier. Brain MRI 05/16/2018. FINDINGS: Brain: Moderate generalized cerebral atrophy. Comparatively mild cerebellar atrophy. Mild patchy and ill-defined hypoattenuation within the cerebral white matter, nonspecific but compatible with chronic small vessel ischemic disease. There is no acute intracranial hemorrhage. No demarcated cortical infarct. No extra-axial fluid collection. No evidence of an intracranial mass. No midline shift. Vascular: No hyperdense vessel.  Atherosclerotic calcifications. Skull: Normal. Negative for fracture or focal lesion. Sinuses/Orbits: Visualized orbits show no acute finding. Mild mucosal thickening within the bilateral frontoethmoidal recesses. Mild mucosal thickening and fluid within the bilateral ethmoid sinuses. Mild mucosal thickening within the bilateral sphenoid sinuses. Trace mucosal thickening within the left maxillary sinus at the imaged levels. Other: Subtle frontoparietal scalp soft tissue swelling (series 5, image 30). IMPRESSION: No evidence of acute intracranial abnormality. Subtle frontoparietal scalp soft tissue swelling slightly eccentric to the right. Mild chronic small vessel image changes within the cerebral white matter, stable from the prior head CT of 11/26/2018. Moderate generalized cerebral atrophy. Comparatively mild cerebellar atrophy. Paranasal sinus disease at the imaged levels, as described. Electronically Signed   By: Kellie Simmering D.O.   On: 12/28/2021 15:25   DG CHEST PORT 1 VIEW  Result Date: 12/10/2021 CLINICAL DATA:  PICC placement. EXAM: PORTABLE CHEST 1 VIEW COMPARISON:  12/09/2021 and CT chest 08/06/2003. FINDINGS: Patient is rotated.  Right PICC tip is in the low SVC or at the SVC RA junction. Heart size is grossly stable.  Mixed interstitial and airspace opacification bilaterally. Focal airspace consolidation in the right perihilar region, as on 12/09/2021. Possible tiny left pleural effusion. IMPRESSION: 1. Right PICC placement with tip in the low SVC or at the SVC RA junction. 2. Pulmonary edema. 3. Right perihilar airspace consolidation, as on 12/09/2021, possibly due to pneumonia. Followup PA and lateral chest X-ray is recommended in 3-4 weeks following trial of antibiotic therapy to ensure resolution and exclude underlying malignancy. Electronically Signed   By: Lorin Picket M.D.   On: 12/10/2021 13:06   DG Chest Port 1 View  Result Date: 12/09/2021 CLINICAL DATA:  88 year old female with history of dyspnea. EXAM: PORTABLE CHEST 1 VIEW COMPARISON:  Chest x-ray 01/01/2022. FINDINGS: Lung volumes are low. Diffuse hazy ill-defined opacities and diffuse interstitial prominence noted throughout the lungs bilaterally. Diffuse peribronchial cuffing. Ill-defined somewhat nodular opacity in the right mid lung, new compared to the prior study, presumably a focus of developing infection. No definite pleural effusions. Mild cardiomegaly. The patient is rotated to the right on today's exam, resulting in distortion of the mediastinal contours and reduced diagnostic sensitivity and specificity for mediastinal pathology. Atherosclerotic calcifications in the thoracic aorta. IMPRESSION: 1. Findings are concerning for severe bronchitis, likely with developing right upper lobe pneumonia, as above. 2. Aortic atherosclerosis. 3. Mild cardiomegaly. Electronically Signed   By: Vinnie Langton M.D.   On: 12/09/2021 08:23   DG Abd Portable 1V  Result Date: 12/10/2021 CLINICAL DATA:  Feeding tube placement EXAM: PORTABLE ABDOMEN - 1 VIEW COMPARISON:  None. FINDINGS: Enteric tube passes into the stomach with tip overlying the distal stomach. IMPRESSION:  Enteric tube within the distal stomach. Electronically Signed   By: Macy Mis M.D.   On: 12/10/2021 15:56   ECHOCARDIOGRAM COMPLETE  Result Date: 12/09/2021    ECHOCARDIOGRAM REPORT   Patient Name:   VALENTINE KUECHLE Date of Exam: 12/09/2021 Medical Rec #:  [id number]          Height:       60.0 in Accession #:    [id number]         Weight:       134.0 lb Date of Birth:  March 11, 1930          BSA:          1.574 m Patient Age:    16 years           BP:           129/69 mmHg Patient Gender: F                  HR:           85 bpm. Exam Location:  Inpatient Procedure: 2D Echo Indications:    Dyspnea  History:        Patient has prior history of Echocardiogram examinations, most                 recent 11/19/2011. Mitral Valve Disease, Arrythmias:Atrial                 Fibrillation; Risk Factors:Hypertension and Diabetes.  Sonographer:    Arlyss Gandy Referring Phys: [place]  1. Left ventricular ejection fraction, by estimation, is 60 to 65%. The left ventricle has normal function. The left ventricle has no regional wall motion abnormalities. Left ventricular diastolic function could not be evaluated.  2. Right ventricular systolic function is normal. The right ventricular size is normal. There is normal pulmonary artery systolic pressure. The  estimated right ventricular systolic pressure is 40.8 mmHg.  3. Left atrial size was mildly dilated.  4. The mitral valve is abnormal. Mild mitral valve regurgitation.  5. The aortic valve is tricuspid. Aortic valve regurgitation is not visualized. Aortic valve sclerosis is present, with no evidence of aortic valve stenosis. Aortic valve mean gradient measures 3.0 mmHg.  6. The inferior vena cava is normal in size with greater than 50% respiratory variability, suggesting right atrial pressure of 3 mmHg. FINDINGS  Left Ventricle: Left ventricular ejection fraction, by estimation, is 60 to 65%. The left ventricle has normal function. The left  ventricle has no regional wall motion abnormalities. The left ventricular internal cavity size was normal in size. There is  no left ventricular hypertrophy. Left ventricular diastolic function could not be evaluated due to atrial fibrillation. Left ventricular diastolic function could not be evaluated. Right Ventricle: The right ventricular size is normal. No increase in right ventricular wall thickness. Right ventricular systolic function is normal. There is normal pulmonary artery systolic pressure. The tricuspid regurgitant velocity is 2.38 m/s, and  with an assumed right atrial pressure of 3 mmHg, the estimated right ventricular systolic pressure is 14.4 mmHg. Left Atrium: Left atrial size was mildly dilated. Right Atrium: Right atrial size was normal in size. Pericardium: There is no evidence of pericardial effusion. Mitral Valve: The mitral valve is abnormal. There is mild calcification of the posterior mitral valve leaflet(s). Mild to moderate mitral annular calcification. Mild mitral valve regurgitation. MV peak gradient, 7.1 mmHg. The mean mitral valve gradient is 3.0 mmHg. Tricuspid Valve: The tricuspid valve is grossly normal. Tricuspid valve regurgitation is trivial. Aortic Valve: The aortic valve is tricuspid. Aortic valve regurgitation is not visualized. Aortic valve sclerosis is present, with no evidence of aortic valve stenosis. Aortic valve mean gradient measures 3.0 mmHg. Aortic valve peak gradient measures 5.8  mmHg. Aortic valve area, by VTI measures 2.91 cm. Pulmonic Valve: The pulmonic valve was normal in structure. Pulmonic valve regurgitation is not visualized. Aorta: The aortic root and ascending aorta are structurally normal, with no evidence of dilitation. Venous: The inferior vena cava is normal in size with greater than 50% respiratory variability, suggesting right atrial pressure of 3 mmHg. IAS/Shunts: No atrial level shunt detected by color flow Doppler.  LEFT VENTRICLE PLAX 2D  LVIDd:         3.20 cm   Diastology LVIDs:         2.30 cm   LV e' medial:    8.70 cm/s LV PW:         0.80 cm   LV E/e' medial:  12.1 LV IVS:        0.80 cm   LV e' lateral:   9.57 cm/s LVOT diam:     2.00 cm   LV E/e' lateral: 11.0 LV SV:         55 LV SV Index:   35 LVOT Area:     3.14 cm  RIGHT VENTRICLE            IVC RV Basal diam:  2.80 cm    IVC diam: 2.00 cm RV Mid diam:    2.00 cm RV S prime:     9.14 cm/s TAPSE (M-mode): 1.6 cm LEFT ATRIUM             Index        RIGHT ATRIUM           Index LA diam:  4.30 cm 2.73 cm/m   RA Area:     12.10 cm LA Vol (A2C):   59.9 ml 38.04 ml/m  RA Volume:   24.20 ml  15.37 ml/m LA Vol (A4C):   51.5 ml 32.71 ml/m LA Biplane Vol: 55.5 ml 35.25 ml/m  AORTIC VALVE AV Area (Vmax):    2.67 cm AV Area (Vmean):   2.83 cm AV Area (VTI):     2.91 cm AV Vmax:           120.00 cm/s AV Vmean:          79.700 cm/s AV VTI:            0.189 m AV Peak Grad:      5.8 mmHg AV Mean Grad:      3.0 mmHg LVOT Vmax:         102.00 cm/s LVOT Vmean:        71.900 cm/s LVOT VTI:          0.175 m LVOT/AV VTI ratio: 0.93  AORTA Ao Root diam: 2.80 cm Ao Asc diam:  3.20 cm MITRAL VALVE                TRICUSPID VALVE MV Area (PHT): 3.27 cm     TR Peak grad:   22.7 mmHg MV Area VTI:   1.93 cm     TR Vmax:        238.00 cm/s MV Peak grad:  7.1 mmHg MV Mean grad:  3.0 mmHg     SHUNTS MV Vmax:       1.33 m/s     Systemic VTI:  0.18 m MV Vmean:      74.2 cm/s    Systemic Diam: 2.00 cm MV Decel Time: 232 msec MV E velocity: 105.00 cm/s MV A velocity: 34.30 cm/s MV E/A ratio:  3.06 Lyman Bishop MD Electronically signed by Lyman Bishop MD Signature Date/Time: 12/09/2021/4:54:38 PM    Final    DG Hip Unilat W or Wo Pelvis 2-3 Views Right  Result Date: 12/12/2021 CLINICAL DATA:  Fall EXAM: DG HIP (WITH OR WITHOUT PELVIS) 2-3V RIGHT COMPARISON:  None. FINDINGS: Status post left hip hemiarthroplasty with no evidence of hardware fracture or loosening. No evidence of left hip dislocation on this  single frontal view. No pelvic fracture or diastasis. No right hip fracture or dislocation. Diffuse osteopenia. No aggressive appearing focal osseous lesions. Mild right hip osteoarthritis. Degenerative changes in the visualized lower lumbar spine. IMPRESSION: No osseous fracture. No right hip dislocation. Status post left hip hemiarthroplasty without evidence of hardware complication. Diffuse osteopenia. Electronically Signed   By: Ilona Sorrel M.D.   On: 12/29/2021 17:10   VAS [place] LOWER EXTREMITY VENOUS (DVT)  Result Date: 12/09/2021  Lower Venous DVT Study Patient Name:  LOUIE MEADERS  Date of Exam:   12/09/2021 Medical Rec #: [id number]           Accession #:    [id number] Date of Birth: 03/01/1930           Patient Gender: F Patient Age:   8 years Exam Location:  [facility] Procedure:      VAS [place] LOWER EXTREMITY VENOUS (DVT) Referring Phys: Nyoka Lint DOUTOVA --------------------------------------------------------------------------------  Indications: Edema.  Limitations: Patient contracted, patient positioning. Comparison Study: no prior Performing Technologist: Archie Patten RVS  Examination Guidelines: A complete evaluation includes B-mode imaging, spectral Doppler, color Doppler, and power Doppler as needed of all accessible portions of each vessel. Bilateral  testing is considered an integral part of a complete examination. Limited examinations for reoccurring indications may be performed as noted. The reflux portion of the exam is performed with the patient in reverse Trendelenburg.  +-----+---------------+---------+-----------+----------+--------------+  RIGHT Compressibility Phasicity Spontaneity Properties Thrombus Aging  +-----+---------------+---------+-----------+----------+--------------+  CFV   Full            Yes       Yes                                    +-----+---------------+---------+-----------+----------+--------------+    +---------+---------------+---------+-----------+----------+-------------------+  LEFT      Compressibility Phasicity Spontaneity Properties Thrombus Aging       +---------+---------------+---------+-----------+----------+-------------------+  CFV       Full            Yes       Yes                                         +---------+---------------+---------+-----------+----------+-------------------+  SFJ       Full                                                                  +---------+---------------+---------+-----------+----------+-------------------+  FV Prox   Full                                                                  +---------+---------------+---------+-----------+----------+-------------------+  FV Mid                    Yes       Yes                                         +---------+---------------+---------+-----------+----------+-------------------+  FV Distal                 Yes       Yes                                         +---------+---------------+---------+-----------+----------+-------------------+  PFV       Full                                                                  +---------+---------------+---------+-----------+----------+-------------------+  POP       Full            Yes       Yes                                         +---------+---------------+---------+-----------+----------+-------------------+  PTV                       Yes       Yes                                         +---------+---------------+---------+-----------+----------+-------------------+  PERO                                                       Not well visualized  +---------+---------------+---------+-----------+----------+-------------------+    Summary: RIGHT: - No evidence of common femoral vein obstruction.  LEFT: - There is no evidence of deep vein thrombosis in the lower extremity. However, portions of this examination were limited- see technologist comments above.  - No cystic  structure found in the popliteal fossa.  *See table(s) above for measurements and observations. Electronically signed by Deitra Mayo MD on 12/09/2021 at 5:06:52 PM.    Final    [place] EKG SITE RITE  Result Date: 12/10/2021 If Site Rite image not attached, placement could not be confirmed due to current cardiac rhythm.   Microbiology: Recent Results (from the past 240 hour(s))  Blood Cultures (routine x 2)     Status: None   Collection Time: 12/07/2021  2:35 PM   Specimen: BLOOD  Result Value Ref Range Status   Specimen Description BLOOD LEFT ANTECUBITAL  Final   Special Requests   Final    BOTTLES DRAWN AEROBIC AND ANAEROBIC Blood Culture results may not be optimal due to an inadequate volume of blood received in culture bottles   Culture   Final    NO GROWTH 5 DAYS Performed at [facility], [place] [address], [place], [place] [address]    Report Status 12-29-2021 FINAL  Final  Blood Cultures (routine x 2)     Status: Abnormal   Collection Time: 12/26/2021  2:40 PM   Specimen: BLOOD LEFT FOREARM  Result Value Ref Range Status   Specimen Description BLOOD LEFT FOREARM  Final   Special Requests   Final    BOTTLES DRAWN AEROBIC AND ANAEROBIC Blood Culture adequate volume   Culture  Setup Time   Final    GRAM POSITIVE COCCI AEROBIC BOTTLE ONLY CRITICAL RESULT CALLED TO, READ BACK BY AND VERIFIED WITH: PHARMD T. DANG [id number] '@1729'$  FH    Culture (A)  Final    STAPHYLOCOCCUS EPIDERMIDIS STAPHYLOCOCCUS HAEMOLYTICUS THE SIGNIFICANCE OF ISOLATING THIS ORGANISM FROM A SINGLE SET OF BLOOD CULTURES WHEN MULTIPLE SETS ARE DRAWN IS UNCERTAIN. PLEASE NOTIFY THE MICROBIOLOGY DEPARTMENT WITHIN ONE WEEK IF SPECIATION AND SENSITIVITIES ARE REQUIRED. CRITICAL RESULT CALLED TO, READ BACK BY AND VERIFIED WITH: PHARMD EMMA U. [id number] FCP Performed at [facility], [place] [address] [place], [place] [address]    Report Status 12/11/2021 FINAL  Final  Blood Culture ID Panel (Reflexed)      Status: Abnormal   Collection Time: 12/25/2021  2:40 PM  Result Value Ref Range Status   Enterococcus faecalis NOT DETECTED NOT DETECTED Final   Enterococcus Faecium NOT DETECTED NOT DETECTED Final   Listeria monocytogenes NOT DETECTED NOT DETECTED Final   Staphylococcus species DETECTED (A) NOT DETECTED Final    Comment: CRITICAL RESULT CALLED  TO, READ BACK BY AND VERIFIED WITH: PHARMD T. DANG [id number] '@1729'$  FH    Staphylococcus aureus (BCID) NOT DETECTED NOT DETECTED Final   Staphylococcus epidermidis DETECTED (A) NOT DETECTED Final    Comment: Methicillin (oxacillin) resistant coagulase negative staphylococcus. Possible blood culture contaminant (unless isolated from more than one blood culture draw or clinical case suggests pathogenicity). No antibiotic treatment is indicated for blood  culture contaminants. CRITICAL RESULT CALLED TO, READ BACK BY AND VERIFIED WITH: PHARMD T. DANG [id number] '@1729'$  FH    Staphylococcus lugdunensis NOT DETECTED NOT DETECTED Final   Streptococcus species NOT DETECTED NOT DETECTED Final   Streptococcus agalactiae NOT DETECTED NOT DETECTED Final   Streptococcus pneumoniae NOT DETECTED NOT DETECTED Final   Streptococcus pyogenes NOT DETECTED NOT DETECTED Final   A.calcoaceticus-baumannii NOT DETECTED NOT DETECTED Final   Bacteroides fragilis NOT DETECTED NOT DETECTED Final   Enterobacterales NOT DETECTED NOT DETECTED Final   Enterobacter cloacae complex NOT DETECTED NOT DETECTED Final   Escherichia coli NOT DETECTED NOT DETECTED Final   Klebsiella aerogenes NOT DETECTED NOT DETECTED Final   Klebsiella oxytoca NOT DETECTED NOT DETECTED Final   Klebsiella pneumoniae NOT DETECTED NOT DETECTED Final   Proteus species NOT DETECTED NOT DETECTED Final   Salmonella species NOT DETECTED NOT DETECTED Final   Serratia marcescens NOT DETECTED NOT DETECTED Final   Haemophilus influenzae NOT DETECTED NOT DETECTED Final   Neisseria meningitidis NOT DETECTED NOT DETECTED  Final   Pseudomonas aeruginosa NOT DETECTED NOT DETECTED Final   Stenotrophomonas maltophilia NOT DETECTED NOT DETECTED Final   Candida albicans NOT DETECTED NOT DETECTED Final   Candida auris NOT DETECTED NOT DETECTED Final   Candida glabrata NOT DETECTED NOT DETECTED Final   Candida krusei NOT DETECTED NOT DETECTED Final   Candida parapsilosis NOT DETECTED NOT DETECTED Final   Candida tropicalis NOT DETECTED NOT DETECTED Final   Cryptococcus neoformans/gattii NOT DETECTED NOT DETECTED Final   Methicillin resistance mecA/C DETECTED (A) NOT DETECTED Final    Comment: CRITICAL RESULT CALLED TO, READ BACK BY AND VERIFIED WITH: PHARMD T. Mina Marble [id number] '@1729'$  FH Performed at [facility] [address], [place], [place] [address]   Resp Panel by RT-PCR (Flu A&B, Covid) Peripheral     Status: None   Collection Time: 12/14/2021  6:22 PM   Specimen: Peripheral; Nasopharyngeal(NP) swabs in vial transport medium  Result Value Ref Range Status   SARS Coronavirus 2 by RT PCR NEGATIVE NEGATIVE Final    Comment: (NOTE) SARS-CoV-2 target nucleic acids are NOT DETECTED.  The SARS-CoV-2 RNA is generally detectable in upper respiratory specimens during the acute phase of infection. The lowest concentration of SARS-CoV-2 viral copies this assay can detect is 138 copies/mL. A negative result does not preclude SARS-Cov-2 infection and should not be used as the sole basis for treatment or other patient management decisions. A negative result may occur with  improper specimen collection/handling, submission of specimen other than nasopharyngeal swab, presence of viral mutation(s) within the areas targeted by this assay, and inadequate number of viral copies(<138 copies/mL). A negative result must be combined with clinical observations, patient history, and epidemiological information. The expected result is Negative.  Fact Sheet for Patients:  EntrepreneurPulse.com.au  Fact Sheet  for Healthcare Providers:  IncredibleEmployment.be  This test is no t yet approved or cleared by the [place] FDA and  has been authorized for detection and/or diagnosis of SARS-CoV-2 by FDA under an Emergency Use Authorization (EUA). This EUA will remain  in effect (meaning this test can be used) for the duration of the COVID-19 declaration under Section 564(b)(1) of the Act, 21 U.S.C.section 360bbb-3(b)(1), unless the authorization is terminated  or revoked sooner.       Influenza A by PCR NEGATIVE NEGATIVE Final   Influenza B by PCR NEGATIVE NEGATIVE Final    Comment: (NOTE) The Xpert Xpress SARS-CoV-2/FLU/RSV plus assay is intended as an aid in the diagnosis of influenza from Nasopharyngeal swab specimens and should not be used as a sole basis for treatment. Nasal washings and aspirates are unacceptable for Xpert Xpress SARS-CoV-2/FLU/RSV testing.  Fact Sheet for Patients: EntrepreneurPulse.com.au  Fact Sheet for Healthcare Providers: IncredibleEmployment.be  This test is not yet approved or cleared by the [place] FDA and has been authorized for detection and/or diagnosis of SARS-CoV-2 by FDA under an Emergency Use Authorization (EUA). This EUA will remain in effect (meaning this test can be used) for the duration of the COVID-19 declaration under Section 564(b)(1) of the Act, 21 U.S.C. section 360bbb-3(b)(1), unless the authorization is terminated or revoked.  Performed at [facility], [place] [address], [place], [place] [address]   Respiratory (~20 pathogens) panel by PCR     Status: Abnormal   Collection Time: 12/17/2021  6:22 PM   Specimen: Nasopharyngeal Swab; Respiratory  Result Value Ref Range Status   Adenovirus NOT DETECTED NOT DETECTED Final   Coronavirus 229E NOT DETECTED NOT DETECTED Final    Comment: (NOTE) The Coronavirus on the Respiratory Panel, DOES NOT test for the novel  Coronavirus  (2019 nCoV)    Coronavirus HKU1 NOT DETECTED NOT DETECTED Final   Coronavirus NL63 NOT DETECTED NOT DETECTED Final   Coronavirus OC43 NOT DETECTED NOT DETECTED Final   Metapneumovirus DETECTED (A) NOT DETECTED Final   Rhinovirus / Enterovirus NOT DETECTED NOT DETECTED Final   Influenza A NOT DETECTED NOT DETECTED Final   Influenza B NOT DETECTED NOT DETECTED Final   Parainfluenza Virus 1 NOT DETECTED NOT DETECTED Final   Parainfluenza Virus 2 NOT DETECTED NOT DETECTED Final   Parainfluenza Virus 3 NOT DETECTED NOT DETECTED Final   Parainfluenza Virus 4 NOT DETECTED NOT DETECTED Final   Respiratory Syncytial Virus NOT DETECTED NOT DETECTED Final   Bordetella pertussis NOT DETECTED NOT DETECTED Final   Bordetella Parapertussis NOT DETECTED NOT DETECTED Final   Chlamydophila pneumoniae NOT DETECTED NOT DETECTED Final   Mycoplasma pneumoniae NOT DETECTED NOT DETECTED Final    Comment: Performed at [facility], [address], [place], [place] [address]  Urine Culture     Status: Abnormal   Collection Time: 12/26/2021  6:48 PM   Specimen: In/Out Cath Urine  Result Value Ref Range Status   Specimen Description IN/OUT CATH URINE  Final   Special Requests   Final    Normal Performed at [facility], [place] [address], [place], [place] [address]    Culture >=100,000 COLONIES/mL ESCHERICHIA COLI (A)  Final   Report Status 12/11/2021 FINAL  Final   Organism ID, Bacteria ESCHERICHIA COLI (A)  Final      Susceptibility   Escherichia coli - MIC*    AMPICILLIN <=2 SENSITIVE Sensitive     CEFAZOLIN <=4 SENSITIVE Sensitive     CEFEPIME <=0.12 SENSITIVE Sensitive     CEFTRIAXONE <=0.25 SENSITIVE Sensitive     CIPROFLOXACIN <=0.25 SENSITIVE Sensitive     GENTAMICIN <=1 SENSITIVE Sensitive     IMIPENEM <=0.25 SENSITIVE Sensitive     NITROFURANTOIN <=  16 SENSITIVE Sensitive     TRIMETH/SULFA <=20 SENSITIVE Sensitive     AMPICILLIN/SULBACTAM <=2 SENSITIVE Sensitive     PIP/TAZO <=4  SENSITIVE Sensitive     * >=100,000 COLONIES/mL ESCHERICHIA COLI  MRSA Next Gen by PCR, Nasal     Status: None   Collection Time: 12/09/21  7:39 PM   Specimen: Nasal Mucosa; Nasal Swab  Result Value Ref Range Status   MRSA by PCR Next Gen NOT DETECTED NOT DETECTED Final    Comment: (NOTE) The GeneXpert MRSA Assay (FDA approved for NASAL specimens only), is one component of a comprehensive MRSA colonization surveillance program. It is not intended to diagnose MRSA infection nor to guide or monitor treatment for MRSA infections. Test performance is not FDA approved in patients less than 69 years old. Performed at [facility], [place] [address] [place], [place] [address]   C Difficile Quick Screen w PCR reflex     Status: None   Collection Time: 12/10/21  2:05 PM   Specimen: STOOL  Result Value Ref Range Status   C Diff antigen NEGATIVE NEGATIVE Final   C Diff toxin NEGATIVE NEGATIVE Final   C Diff interpretation No C. difficile detected.  Final    Comment: Performed at [facility], [place] [address] [place], [place] [address]      Signed: Phillips Climes, MD

## 2022-01-03 NOTE — Progress Notes (Addendum)
ANTICOAGULATION CONSULT NOTE - Follow Up Consult ? ?Addendum: warfarin discontinued prior to 3/11 dose being given.  ? ?-------------------------------------------------------------------------------- ? ?Pharmacy Consult for Warfarin ?Indication: atrial fibrillation ? ?Patient Measurements: ?Height: 5' (152.4 cm) ?Weight: 61.2 kg (134 lb 14.7 oz) ?IBW/kg (Calculated) : 45.5 ? ?Vital Signs: ?Temp: 99 ?F (37.2 ?C) (03/11 0319) ?Temp Source: Axillary (03/11 0319) ?BP: 111/49 (03/11 0400) ?Pulse Rate: 88 (03/11 0400) ? ?Labs: ?Recent Labs  ?  12/11/21 ?0239 12/12/21 ?0401 01/07/2022 ?0336  ?HGB 10.5* 9.5* 8.9*  ?HCT 32.3* 29.2* 26.7*  ?PLT 161 158 188  ?LABPROT 27.7* 22.0* 18.7*  ?INR 2.6* 1.9* 1.6*  ?CREATININE 0.84 0.81 1.00  ? ? ? ?Estimated Creatinine Clearance: 31.2 mL/min (by C-G formula based on SCr of 1 mg/dL). ? ?Assessment: ?7 yof with a history of DM2, CHF systolic diastolic chf, HDL, HTN, dementia. A.fib on coumadin. Patient is presenting with AMS. Warfarin per pharmacy consult placed for atrial fibrillation. INR 1.4 on admit 3/6 and Lovenox bridge was added while INR subtherapeutic, stopped 3/8. On amiodarone- increased effects of warfarin.  ? ?PTA warfarin regimen: 2.5 mg on TuTh and 1.25 mg all other days.  ? - Last dose PTA on 3/5 ? ?3/11 INR subtherapeutic at 1.6 after warfarin 2.5 mg yesterday, did not receive amiodarone yesterday. Back on amiodarone today. Will give warfarin '3mg'$ .  ? ?Goal of Therapy:  ?INR 2-3 ?Monitor platelets by anticoagulation protocol: Yes ?  ?Plan:  ?Warfarin 3 mg x 1 today ?Daily PT/INR ?Monitor for bleeding ? ?Donald Pore, RPh ?Jan 07, 2022,7:54 AM ? ? ?

## 2022-01-03 NOTE — Progress Notes (Signed)
Chaplain responded to page from pt's physician requesting visit with husband who had just made decision to move his beloved wife to comfort care.  Chaplain found husband at his wife's bedside, sad over his wife's medical condition.  Chaplain initiated ministry of presence, listening as husband told of their love story:  both were 88 years old when they met, neither had been married before.  He and I agreed theirs was a union "made in heaven."  Chaplain stayed offering comfort and consolation until the family pastor arrived. ? ?De Burrs ?Chaplain ?

## 2022-01-03 DEATH — deceased

## 2022-01-13 ENCOUNTER — Ambulatory Visit: Payer: Medicare HMO | Admitting: Podiatry

## 2022-01-13 ENCOUNTER — Telehealth: Payer: Self-pay | Admitting: Podiatry

## 2022-01-13 NOTE — Telephone Encounter (Signed)
Pts husband called and wanted to make sure her appt for today was canceled as she has passed away. ? ?I told pt we were sorry for his loss and I would let Dr Prudence Davidson know as well. ?
